# Patient Record
Sex: Male | Born: 1940 | Race: White | Hispanic: No | Marital: Married | Smoking: Former smoker
Health system: Southern US, Community
[De-identification: ages and names within clinical notes are randomized; demographics above are authoritative.]

## PROBLEM LIST (undated history)

## (undated) DIAGNOSIS — I2510 Atherosclerotic heart disease of native coronary artery without angina pectoris: Secondary | ICD-10-CM

## (undated) DIAGNOSIS — I10 Essential (primary) hypertension: Secondary | ICD-10-CM

## (undated) DIAGNOSIS — I779 Disorder of arteries and arterioles, unspecified: Secondary | ICD-10-CM

## (undated) DIAGNOSIS — C61 Malignant neoplasm of prostate: Secondary | ICD-10-CM

## (undated) DIAGNOSIS — K219 Gastro-esophageal reflux disease without esophagitis: Secondary | ICD-10-CM

## (undated) DIAGNOSIS — E119 Type 2 diabetes mellitus without complications: Secondary | ICD-10-CM

## (undated) DIAGNOSIS — J449 Chronic obstructive pulmonary disease, unspecified: Secondary | ICD-10-CM

## (undated) DIAGNOSIS — J189 Pneumonia, unspecified organism: Secondary | ICD-10-CM

## (undated) DIAGNOSIS — E785 Hyperlipidemia, unspecified: Secondary | ICD-10-CM

## (undated) DIAGNOSIS — I350 Nonrheumatic aortic (valve) stenosis: Secondary | ICD-10-CM

## (undated) DIAGNOSIS — D638 Anemia in other chronic diseases classified elsewhere: Secondary | ICD-10-CM

## (undated) DIAGNOSIS — M1990 Unspecified osteoarthritis, unspecified site: Secondary | ICD-10-CM

## (undated) DIAGNOSIS — I714 Abdominal aortic aneurysm, without rupture, unspecified: Secondary | ICD-10-CM

## (undated) DIAGNOSIS — T4145XA Adverse effect of unspecified anesthetic, initial encounter: Secondary | ICD-10-CM

## (undated) DIAGNOSIS — T8859XA Other complications of anesthesia, initial encounter: Secondary | ICD-10-CM

## (undated) DIAGNOSIS — R0602 Shortness of breath: Secondary | ICD-10-CM

## (undated) DIAGNOSIS — I5022 Chronic systolic (congestive) heart failure: Secondary | ICD-10-CM

## (undated) DIAGNOSIS — I255 Ischemic cardiomyopathy: Secondary | ICD-10-CM

## (undated) DIAGNOSIS — R7303 Prediabetes: Secondary | ICD-10-CM

## (undated) DIAGNOSIS — N189 Chronic kidney disease, unspecified: Secondary | ICD-10-CM

## (undated) DIAGNOSIS — I739 Peripheral vascular disease, unspecified: Secondary | ICD-10-CM

## (undated) DIAGNOSIS — Z9289 Personal history of other medical treatment: Secondary | ICD-10-CM

## (undated) DIAGNOSIS — I509 Heart failure, unspecified: Secondary | ICD-10-CM

## (undated) DIAGNOSIS — F419 Anxiety disorder, unspecified: Secondary | ICD-10-CM

## (undated) DIAGNOSIS — G4733 Obstructive sleep apnea (adult) (pediatric): Secondary | ICD-10-CM

## (undated) HISTORY — DX: Gastro-esophageal reflux disease without esophagitis: K21.9

## (undated) HISTORY — PX: CARDIAC VALVE REPLACEMENT: SHX585

---

## 1969-03-03 HISTORY — PX: NASAL FRACTURE SURGERY: SHX718

## 2006-04-05 ENCOUNTER — Encounter: Admission: RE | Admit: 2006-04-05 | Discharge: 2006-04-05 | Payer: Self-pay | Admitting: Internal Medicine

## 2006-07-03 HISTORY — PX: ANAL FISSURE REPAIR: SHX2312

## 2008-05-06 ENCOUNTER — Emergency Department (HOSPITAL_COMMUNITY): Admission: EM | Admit: 2008-05-06 | Discharge: 2008-05-06 | Payer: Self-pay | Admitting: Emergency Medicine

## 2011-04-04 LAB — BASIC METABOLIC PANEL WITH GFR
Calcium: 9.3
Creatinine, Ser: 0.86
GFR calc Af Amer: >60
GFR calc non Af Amer: >60
Glucose, Bld: 100 — ABNORMAL HIGH
Sodium: 141

## 2011-04-04 LAB — CBC
HCT: 45.5
Hemoglobin: 15.1
MCHC: 33.1
MCV: 91.6
Platelets: 157
RBC: 4.96
RDW: 13.7
WBC: 6.7

## 2011-04-04 LAB — DIFFERENTIAL
Basophils Absolute: 0
Basophils Relative: 1
Eosinophils Absolute: 0.2
Eosinophils Relative: 3
Lymphocytes Relative: 20
Lymphs Abs: 1.3
Monocytes Absolute: 0.5
Monocytes Relative: 7
Neutro Abs: 4.7
Neutrophils Relative %: 70

## 2011-04-04 LAB — BASIC METABOLIC PANEL
BUN: 14
CO2: 29
Chloride: 105
Potassium: 3.3 — ABNORMAL LOW

## 2011-04-04 LAB — POCT CARDIAC MARKERS
CKMB, poc: 1.4
CKMB, poc: <1 — ABNORMAL LOW
Myoglobin, poc: 54.8
Troponin i, poc: <0.05
Troponin i, poc: <0.05

## 2012-07-03 DIAGNOSIS — J189 Pneumonia, unspecified organism: Secondary | ICD-10-CM

## 2012-07-03 HISTORY — PX: TRACHEOSTOMY: SUR1362

## 2012-07-03 HISTORY — DX: Pneumonia, unspecified organism: J18.9

## 2012-07-20 ENCOUNTER — Inpatient Hospital Stay (HOSPITAL_COMMUNITY)
Admission: AD | Admit: 2012-07-20 | Discharge: 2012-08-12 | DRG: 878 | Disposition: A | Discharge status: Skilled Nursing Facility | Payer: BC Managed Care – PPO | Source: Other Acute Inpatient Hospital | Attending: Internal Medicine | Admitting: Internal Medicine

## 2012-07-20 ENCOUNTER — Inpatient Hospital Stay (HOSPITAL_COMMUNITY): Payer: BC Managed Care – PPO

## 2012-07-20 DIAGNOSIS — Y849 Medical procedure, unspecified as the cause of abnormal reaction of the patient, or of later complication, without mention of misadventure at the time of the procedure: Secondary | ICD-10-CM | POA: Diagnosis not present

## 2012-07-20 DIAGNOSIS — R7309 Other abnormal glucose: Secondary | ICD-10-CM | POA: Diagnosis present

## 2012-07-20 DIAGNOSIS — J449 Chronic obstructive pulmonary disease, unspecified: Secondary | ICD-10-CM | POA: Diagnosis present

## 2012-07-20 DIAGNOSIS — J4489 Other specified chronic obstructive pulmonary disease: Secondary | ICD-10-CM | POA: Diagnosis present

## 2012-07-20 DIAGNOSIS — R6520 Severe sepsis without septic shock: Secondary | ICD-10-CM | POA: Diagnosis present

## 2012-07-20 DIAGNOSIS — E876 Hypokalemia: Secondary | ICD-10-CM | POA: Diagnosis not present

## 2012-07-20 DIAGNOSIS — I214 Non-ST elevation (NSTEMI) myocardial infarction: Secondary | ICD-10-CM | POA: Diagnosis not present

## 2012-07-20 DIAGNOSIS — E785 Hyperlipidemia, unspecified: Secondary | ICD-10-CM | POA: Diagnosis present

## 2012-07-20 DIAGNOSIS — L270 Generalized skin eruption due to drugs and medicaments taken internally: Secondary | ICD-10-CM | POA: Diagnosis not present

## 2012-07-20 DIAGNOSIS — E872 Acidosis, unspecified: Secondary | ICD-10-CM | POA: Diagnosis present

## 2012-07-20 DIAGNOSIS — G4733 Obstructive sleep apnea (adult) (pediatric): Secondary | ICD-10-CM | POA: Diagnosis present

## 2012-07-20 DIAGNOSIS — I509 Heart failure, unspecified: Secondary | ICD-10-CM | POA: Diagnosis present

## 2012-07-20 DIAGNOSIS — G253 Myoclonus: Secondary | ICD-10-CM | POA: Diagnosis not present

## 2012-07-20 DIAGNOSIS — T80211A Bloodstream infection due to central venous catheter, initial encounter: Secondary | ICD-10-CM | POA: Diagnosis not present

## 2012-07-20 DIAGNOSIS — D649 Anemia, unspecified: Secondary | ICD-10-CM | POA: Diagnosis present

## 2012-07-20 DIAGNOSIS — J9601 Acute respiratory failure with hypoxia: Secondary | ICD-10-CM | POA: Diagnosis present

## 2012-07-20 DIAGNOSIS — J9600 Acute respiratory failure, unspecified whether with hypoxia or hypercapnia: Secondary | ICD-10-CM | POA: Diagnosis present

## 2012-07-20 DIAGNOSIS — J13 Pneumonia due to Streptococcus pneumoniae: Secondary | ICD-10-CM | POA: Diagnosis present

## 2012-07-20 DIAGNOSIS — I714 Abdominal aortic aneurysm, without rupture, unspecified: Secondary | ICD-10-CM | POA: Diagnosis present

## 2012-07-20 DIAGNOSIS — F17200 Nicotine dependence, unspecified, uncomplicated: Secondary | ICD-10-CM | POA: Diagnosis present

## 2012-07-20 DIAGNOSIS — I723 Aneurysm of iliac artery: Secondary | ICD-10-CM | POA: Diagnosis present

## 2012-07-20 DIAGNOSIS — E871 Hypo-osmolality and hyponatremia: Secondary | ICD-10-CM | POA: Diagnosis not present

## 2012-07-20 DIAGNOSIS — J869 Pyothorax without fistula: Secondary | ICD-10-CM | POA: Diagnosis not present

## 2012-07-20 DIAGNOSIS — D696 Thrombocytopenia, unspecified: Secondary | ICD-10-CM | POA: Diagnosis present

## 2012-07-20 DIAGNOSIS — Y9210 Unspecified residential institution as the place of occurrence of the external cause: Secondary | ICD-10-CM | POA: Diagnosis not present

## 2012-07-20 DIAGNOSIS — J80 Acute respiratory distress syndrome: Secondary | ICD-10-CM | POA: Diagnosis present

## 2012-07-20 DIAGNOSIS — R569 Unspecified convulsions: Secondary | ICD-10-CM

## 2012-07-20 DIAGNOSIS — I10 Essential (primary) hypertension: Secondary | ICD-10-CM | POA: Diagnosis present

## 2012-07-20 DIAGNOSIS — G9340 Encephalopathy, unspecified: Secondary | ICD-10-CM | POA: Diagnosis present

## 2012-07-20 DIAGNOSIS — T360X5A Adverse effect of penicillins, initial encounter: Secondary | ICD-10-CM | POA: Diagnosis not present

## 2012-07-20 DIAGNOSIS — S058X9A Other injuries of unspecified eye and orbit, initial encounter: Secondary | ICD-10-CM | POA: Diagnosis not present

## 2012-07-20 DIAGNOSIS — N179 Acute kidney failure, unspecified: Secondary | ICD-10-CM | POA: Diagnosis present

## 2012-07-20 DIAGNOSIS — K560 Paralytic ileus: Secondary | ICD-10-CM | POA: Diagnosis not present

## 2012-07-20 DIAGNOSIS — Y998 Other external cause status: Secondary | ICD-10-CM

## 2012-07-20 DIAGNOSIS — A409 Streptococcal sepsis, unspecified: Principal | ICD-10-CM | POA: Diagnosis present

## 2012-07-20 DIAGNOSIS — R259 Unspecified abnormal involuntary movements: Secondary | ICD-10-CM

## 2012-07-20 DIAGNOSIS — E2749 Other adrenocortical insufficiency: Secondary | ICD-10-CM | POA: Diagnosis not present

## 2012-07-20 DIAGNOSIS — J9589 Other postprocedural complications and disorders of respiratory system, not elsewhere classified: Secondary | ICD-10-CM

## 2012-07-20 DIAGNOSIS — J111 Influenza due to unidentified influenza virus with other respiratory manifestations: Secondary | ICD-10-CM | POA: Diagnosis present

## 2012-07-20 DIAGNOSIS — A419 Sepsis, unspecified organism: Secondary | ICD-10-CM | POA: Diagnosis not present

## 2012-07-20 DIAGNOSIS — X58XXXA Exposure to other specified factors, initial encounter: Secondary | ICD-10-CM

## 2012-07-20 HISTORY — DX: Obstructive sleep apnea (adult) (pediatric): G47.33

## 2012-07-20 HISTORY — DX: Prediabetes: R73.03

## 2012-07-20 HISTORY — DX: Essential (primary) hypertension: I10

## 2012-07-20 HISTORY — DX: Hyperlipidemia, unspecified: E78.5

## 2012-07-20 LAB — URINALYSIS, ROUTINE W REFLEX MICROSCOPIC
Nitrite: NEGATIVE
Protein, ur: 30 mg/dL — AB
Urobilinogen, UA: 1 mg/dL (ref 0.0–1.0)

## 2012-07-20 LAB — BLOOD GAS, ARTERIAL
Acid-base deficit: 5.6 mmol/L — ABNORMAL HIGH (ref 0.0–2.0)
Drawn by: 252031
FIO2: 100 %
MECHVT: 580 mL
MECHVT: 580 mL
O2 Saturation: 92 %
O2 Saturation: 93.2 %
PEEP: 15 cmH2O
Patient temperature: 98.6
Patient temperature: 98.6
RATE: 25 resp/min
TCO2: 22.2 mmol/L (ref 0–100)

## 2012-07-20 LAB — CBC WITH DIFFERENTIAL/PLATELET
Basophils Absolute: 0 10*3/uL (ref 0.0–0.1)
Eosinophils Relative: 0 % (ref 0–5)
Lymphocytes Relative: 4 % — ABNORMAL LOW (ref 12–46)
Lymphs Abs: 0.4 10*3/uL — ABNORMAL LOW (ref 0.7–4.0)
MCH: 29.3 pg (ref 26.0–34.0)
MCHC: 33.1 g/dL (ref 30.0–36.0)
Monocytes Absolute: 0.1 10*3/uL (ref 0.1–1.0)
Neutrophils Relative %: 33 % — ABNORMAL LOW (ref 43–77)
Platelets: 144 10*3/uL — ABNORMAL LOW (ref 150–400)
RBC: 3.92 MIL/uL — ABNORMAL LOW (ref 4.22–5.81)
WBC Morphology: INCREASED

## 2012-07-20 LAB — COMPREHENSIVE METABOLIC PANEL
ALT: 42 U/L (ref 0–53)
BUN: 92 mg/dL — ABNORMAL HIGH (ref 6–23)
CO2: 20 mEq/L (ref 19–32)
Calcium: 7.8 mg/dL — ABNORMAL LOW (ref 8.4–10.5)
Creatinine, Ser: 4.04 mg/dL — ABNORMAL HIGH (ref 0.50–1.35)
GFR calc Af Amer: 16 mL/min — ABNORMAL LOW (ref >90)
GFR calc non Af Amer: 14 mL/min — ABNORMAL LOW (ref >90)
Glucose, Bld: 212 mg/dL — ABNORMAL HIGH (ref 70–99)
Sodium: 129 mEq/L — ABNORMAL LOW (ref 135–145)
Total Protein: 6 g/dL (ref 6.0–8.3)

## 2012-07-20 LAB — GLUCOSE, CAPILLARY: Glucose-Capillary: 197 mg/dL — ABNORMAL HIGH (ref 70–99)

## 2012-07-20 LAB — LACTIC ACID, PLASMA: Lactic Acid, Venous: 1.6 mmol/L (ref 0.5–2.2)

## 2012-07-20 LAB — POCT I-STAT 3, ART BLOOD GAS (G3+)
Bicarbonate: 21.5 mEq/L (ref 20.0–24.0)
TCO2: 23 mmol/L (ref 0–100)
pH, Arterial: 7.196 — CL (ref 7.350–7.450)
pO2, Arterial: 62 mmHg — ABNORMAL LOW (ref 80.0–100.0)

## 2012-07-20 LAB — URINE MICROSCOPIC-ADD ON

## 2012-07-20 LAB — APTT: aPTT: 35 seconds (ref 24–37)

## 2012-07-20 MED ORDER — INSULIN ASPART 100 UNIT/ML ~~LOC~~ SOLN
2.0000 [IU] | SUBCUTANEOUS | Status: DC
  Administered 2012-07-20: 4 [IU] via SUBCUTANEOUS
  Administered 2012-07-21: 6 [IU] via SUBCUTANEOUS

## 2012-07-20 MED ORDER — VANCOMYCIN HCL 10 G IV SOLR
2000.0000 mg | Freq: Once | INTRAVENOUS | Status: AC
  Administered 2012-07-20: 2000 mg via INTRAVENOUS
  Filled 2012-07-20: qty 2000

## 2012-07-20 MED ORDER — SODIUM CHLORIDE 0.9 % IV SOLN
1.0000 mg/h | INTRAVENOUS | Status: DC
  Administered 2012-07-20: 2 mg/h via INTRAVENOUS
  Administered 2012-07-21: 3 mg/h via INTRAVENOUS
  Administered 2012-07-21 – 2012-07-23 (×4): 4 mg/h via INTRAVENOUS
  Administered 2012-07-24 – 2012-07-28 (×4): 2 mg/h via INTRAVENOUS
  Filled 2012-07-20 (×12): qty 10

## 2012-07-20 MED ORDER — SODIUM CHLORIDE 0.9 % IV SOLN
250.0000 mL | INTRAVENOUS | Status: DC | PRN
  Administered 2012-07-22: 250 mL via INTRAVENOUS
  Administered 2012-07-26: 1000 mL via INTRAVENOUS

## 2012-07-20 MED ORDER — FENTANYL CITRATE 0.05 MG/ML IJ SOLN: 25.0000 ug/h | INTRAMUSCULAR | Status: DC

## 2012-07-20 MED ORDER — NOREPINEPHRINE BITARTRATE 1 MG/ML IJ SOLN
2.0000 ug/min | INTRAVENOUS | Status: DC
  Administered 2012-07-21: 50 ug/min via INTRAVENOUS
  Administered 2012-07-21: 22 ug/min via INTRAVENOUS
  Filled 2012-07-20 (×5): qty 16

## 2012-07-20 MED ORDER — IOHEXOL 300 MG/ML  SOLN
20.0000 mL | INTRAMUSCULAR | Status: DC
Start: 2012-07-20 — End: 2012-07-20

## 2012-07-20 MED ORDER — FENTANYL BOLUS VIA INFUSION: 25.0000 ug | Freq: Four times a day (QID) | INTRAVENOUS | Status: DC | PRN

## 2012-07-20 MED ORDER — DEXTROSE 5 % IV SOLN
1.0000 g | INTRAVENOUS | Status: DC
  Administered 2012-07-20 – 2012-07-24 (×5): 1 g via INTRAVENOUS
  Filled 2012-07-20 (×6): qty 1

## 2012-07-20 MED ORDER — SODIUM CHLORIDE 0.9 % IV SOLN: 1.0000 mg/h | INTRAVENOUS | Status: DC

## 2012-07-20 MED ORDER — PANTOPRAZOLE SODIUM 40 MG IV SOLR
40.0000 mg | Freq: Every day | INTRAVENOUS | Status: DC
  Administered 2012-07-20 – 2012-07-25 (×6): 40 mg via INTRAVENOUS
  Filled 2012-07-20 (×7): qty 40

## 2012-07-20 MED ORDER — SODIUM CHLORIDE 0.9 % IV SOLN
25.0000 ug/h | INTRAVENOUS | Status: DC
  Administered 2012-07-20: 100 ug/h via INTRAVENOUS
  Administered 2012-07-21: 300 ug/h via INTRAVENOUS
  Administered 2012-07-22: 250 ug/h via INTRAVENOUS
  Administered 2012-07-22 (×2): 300 ug/h via INTRAVENOUS
  Administered 2012-07-23: 200 ug/h via INTRAVENOUS
  Administered 2012-07-23 – 2012-07-24 (×2): 100 ug/h via INTRAVENOUS
  Administered 2012-07-24 – 2012-07-26 (×3): 200 ug/h via INTRAVENOUS
  Administered 2012-07-29: 50 ug/h via INTRAVENOUS
  Administered 2012-07-31 – 2012-08-01 (×2): 150 ug/h via INTRAVENOUS
  Administered 2012-08-02 (×2): 300 ug/h via INTRAVENOUS
  Administered 2012-08-03: 200 ug/h via INTRAVENOUS
  Administered 2012-08-03: 300 ug/h via INTRAVENOUS
  Administered 2012-08-05 – 2012-08-07 (×2): 100 ug/h via INTRAVENOUS
  Filled 2012-07-20 (×25): qty 50

## 2012-07-20 MED ORDER — MIDAZOLAM BOLUS VIA INFUSION: 1.0000 mg | INTRAVENOUS | Status: DC | PRN

## 2012-07-20 MED ORDER — ASPIRIN 300 MG RE SUPP
300.0000 mg | RECTAL | Status: AC
  Filled 2012-07-20: qty 1

## 2012-07-20 MED ORDER — ASPIRIN 81 MG PO CHEW
324.0000 mg | CHEWABLE_TABLET | ORAL | Status: AC
  Administered 2012-07-20: 324 mg via ORAL
  Filled 2012-07-20: qty 3
  Filled 2012-07-20: qty 1

## 2012-07-20 MED ORDER — MIDAZOLAM BOLUS VIA INFUSION
1.0000 mg | INTRAVENOUS | Status: DC | PRN
  Filled 2012-07-20: qty 2

## 2012-07-20 MED ORDER — BIOTENE DRY MOUTH MT LIQD
15.0000 mL | Freq: Four times a day (QID) | OROMUCOSAL | Status: DC
  Administered 2012-07-21 – 2012-08-12 (×92): 15 mL via OROMUCOSAL

## 2012-07-20 MED ORDER — OSELTAMIVIR PHOSPHATE 75 MG PO CAPS
75.0000 mg | ORAL_CAPSULE | Freq: Two times a day (BID) | ORAL | Status: DC
  Administered 2012-07-21: 75 mg via ORAL
  Filled 2012-07-20 (×4): qty 1

## 2012-07-20 MED ORDER — CHLORHEXIDINE GLUCONATE 0.12 % MT SOLN
15.0000 mL | Freq: Two times a day (BID) | OROMUCOSAL | Status: DC
  Administered 2012-07-20 – 2012-08-12 (×47): 15 mL via OROMUCOSAL
  Filled 2012-07-20 (×32): qty 15
  Filled 2012-07-20: qty 30
  Filled 2012-07-20 (×11): qty 15

## 2012-07-20 MED ORDER — SODIUM BICARBONATE 8.4 % IV SOLN
INTRAVENOUS | Status: DC
  Administered 2012-07-20 – 2012-07-21 (×3): via INTRAVENOUS
  Filled 2012-07-20 (×11): qty 150

## 2012-07-20 MED ORDER — VASOPRESSIN 20 UNIT/ML IJ SOLN
0.0300 [IU]/min | INTRAVENOUS | Status: DC
  Administered 2012-07-20 – 2012-07-23 (×4): 0.03 [IU]/min via INTRAVENOUS
  Filled 2012-07-20 (×3): qty 2.5

## 2012-07-20 MED ORDER — LEVOFLOXACIN IN D5W 750 MG/150ML IV SOLN
750.0000 mg | INTRAVENOUS | Status: DC
  Administered 2012-07-20 – 2012-07-24 (×3): 750 mg via INTRAVENOUS
  Filled 2012-07-20 (×3): qty 150

## 2012-07-20 MED ORDER — NOREPINEPHRINE BITARTRATE 1 MG/ML IJ SOLN
2.0000 ug/min | INTRAMUSCULAR | Status: DC
  Filled 2012-07-20 (×2): qty 4

## 2012-07-20 MED ORDER — IOHEXOL 300 MG/ML  SOLN
25.0000 mL | INTRAMUSCULAR | Status: AC
  Administered 2012-07-20 – 2012-07-21 (×2): 25 mL via ORAL

## 2012-07-20 MED ORDER — IOHEXOL 300 MG/ML  SOLN: 25.0000 mL | INTRAMUSCULAR | Status: DC

## 2012-07-20 MED ORDER — FENTANYL BOLUS VIA INFUSION
25.0000 ug | Freq: Four times a day (QID) | INTRAVENOUS | Status: DC | PRN
  Administered 2012-07-31: 25 ug via INTRAVENOUS
  Administered 2012-07-31 (×2): 50 ug via INTRAVENOUS
  Administered 2012-08-01 (×3): 100 ug via INTRAVENOUS
  Filled 2012-07-20: qty 100

## 2012-07-20 MED ORDER — HEPARIN SODIUM (PORCINE) 5000 UNIT/ML IJ SOLN
5000.0000 [IU] | Freq: Three times a day (TID) | INTRAMUSCULAR | Status: DC
  Administered 2012-07-20 – 2012-08-02 (×37): 5000 [IU] via SUBCUTANEOUS
  Filled 2012-07-20 (×42): qty 1

## 2012-07-20 NOTE — Progress Notes (Signed)
Abdominal film reviewed   CT abdomen ordered.

## 2012-07-20 NOTE — Progress Notes (Signed)
Called Dr. Conception Chancy about chest and abdomen xray findings. [name] wants OG on low intermittent suction at this time.

## 2012-07-20 NOTE — Progress Notes (Signed)
CRITICAL VALUE ALERT  Critical value received: Troponin 0.46  Date of notification:  07/20/2012  Time of notification:  10:09 PM  Critical value read back:yes  Nurse who received alert:  Leonides Sake   MD notified (1st page):  albon  Time of first page:  10:09 PM  MD notified (2nd page):  Time of second page:  Responding MD:  Conception Chancy  Time MD responded:  10:09 PM

## 2012-07-20 NOTE — H&P (Signed)
PULMONARY  / CRITICAL CARE MEDICINE  Name: [name] MRN: [id number] DOB: [birth year]    LOS: 0  REFERRING MD :  [name] ER   CHIEF COMPLAINT:  Resp Failure and PNA  BRIEF PATIENT DESCRIPTION:  72 yo male with known hx of HTN, CHF presented to [facility] ER on 1/18 w/ 1 week hx of cough , congestion w/ progressively worsening dyspnea . Hypoxia in ER , decompensated requiring intubation . CXR w/ multilobar PNA . Hypotension unresponsive to fluids requiring pressors. CCM asked to accept at [facility] ICU 1/18.   LINES / TUBES: L. CVL 1/18 >> ETT 1/18 >>  CULTURES: 1/18 BC>> 1/18 Sputum >> 1/18 UC >> 1/18 Flu  1/18 lactate 2.3  1/18 PCT>  ANTIBIOTICS: 1/18 Vanc>> 1/18 Levaquin >> 1/18 Tamiflu >>  SIGNIFICANT EVENTS:  1/18 Transfer to [facility] from [place] for resp failure   LEVEL OF CARE:  ICU  PRIMARY SERVICE:  PCCM  CONSULTANTS:   CODE STATUS : Full  DIET:  NPO  DVT Px:  Hep SQ  GI Px:  PPI   HISTORY OF PRESENT ILLNESS:  72 yo male with known hx of HTN, CHF presented to [facility] ER on 1/18 w/ 1 week hx of cough , congestion w/ progressively worsening dyspnea . Hypoxia in ER , decompensated requiring intubation . CXR w/ multilobar PNA . Hypotension unresponsive to fluid challenged requiring pressors. CCM asked to accept at [facility] ICU 1/18. Transported via carelink . Renal failure w/ scr ~4 . Received 6L + in ER.     PAST MEDICAL HISTORY :  No past medical history on file. No past surgical history on file. Prior to Admission medications   Not on File   Allergies not on file  FAMILY HISTORY:  No family history on file. SOCIAL HISTORY:  does not have a smoking history on file. He does not have any smokeless tobacco history on file. His alcohol and drug histories not on file.  REVIEW OF SYSTEMS:   Unable to obtain , sedated /intubated on vent    INTERVAL HISTORY:   VITAL SIGNS:   HEMODYNAMICS:   VENTILATOR SETTINGS:   INTAKE / OUTPUT: Intake/Output    None     PHYSICAL EXAMINATION: General:  Sedated on vent  Neuro:  Sedated , pupils 80mm RTL HEENT:  ETT  Cardiovascular:  Tachy, s1s2 nml,  Lungs:  Diminished BS w/ bibasilar crackles  Abdomen: obese, distended, hypoactive Musculoskeletal:  Intact  Skin:  Intact    LABS: Cbc No results found for this basename: WBC:,HGB:3,HCT:3,PLT:3 in the last 168 hours  Chemistry  No results found for this basename: NA:3,K:3,CL:3,CO2:3,BUN:3,CREATININE:3,CALCIUM:3,MG:3,PHOS:3,GLUCOSE:3 in the last 168 hours  Liver fxn No results found for this basename: AST:3,ALT:3,ALKPHOS:3,BILITOT:3,PROT:3,ALBUMIN:3 in the last 168 hours coags No results found for this basename: APTT:3,INR:3 in the last 168 hours Sepsis markers No results found for this basename: LATICACIDVEN:3,PROCALCITON:3 in the last 168 hours Cardiac markers No results found for this basename: CKTOTAL:3,CKMB:3,TROPONINI:3 in the last 168 hours BNP No results found for this basename: PROBNP:3 in the last 168 hours ABG No results found for this basename: PHART:3,PCO2ART:3,PO2ART:3,HCO3:3,TCO2:3 in the last 168 hours  CBG trend No results found for this basename: GLUCAP:5 in the last 168 hours  IMAGING: CXR 1/18 -bilateral aspdz   ECG:  DIAGNOSES: Active Problems:  * No active hospital problems. *    ASSESSMENT / PLAN:  PULMONARY  ASSESSMENT: Acute Hypoxic Respiratory Failure in setting of multilobar PNA   PLAN:   ARDS protocol -  keep plat <30 Pan cx w/ influenza panel  Resp isolation  Empiric Tamiflu  Begin Vanc and Levaquin    CARDIOVASCULAR  ASSESSMENT:  Hypotension -pressor dependent , 6L+  Hx of CHF  Elevated Cardiac enzymes -no acute changes on EKG, ? Demand vs ischemia   PLAN:  Levophed for MAP >65  Check cvp q4  Place A-line  Tr cardiac enzymes    RENAL  ASSESSMENT:   Acute renal Failure  UA +   PLAN:   Fluid resusuciation  Follow bmet  Check urine cx  Bicarb gtt to enable lung protective  ventilation  GASTROINTESTINAL  ASSESSMENT:    PLAN:   PPI  Monitor hbg  HEMATOLOGIC  ASSESSMENT:   PLAN:  Monitor   INFECTIOUS  ASSESSMENT:  Multilobar PNA  ?Influenza syndrome   PLAN:   Pan Cx /influenza panel  Empiric abx w/ Leva/Vanc /cefepime Tamiflu empiric   ENDOCRINE  ASSESSMENT:   Hyperglycemia   PLAN:   SSI   NEUROLOGIC  ASSESSMENT:   PLAN:   Sedation protocol -versed/ fent gtt  CLINICAL SUMMARY: Dense LLL & RLL pneumonia with ARDS/ acute renal failure ? Flu with nstemi - guarded prognosis    PARRETT,TAMMY NP-C  Pulmonary and [place] Pager: [phone]  I have personally obtained a history, examined the patient, evaluated laboratory and imaging results, formulated the assessment and plan and placed orders. CRITICAL CARE: The patient is critically ill with multiple organ systems failure and requires high complexity decision making for assessment and support, frequent evaluation and titration of therapies, application of advanced monitoring technologies and extensive interpretation of multiple databases. Critical Care Time devoted to patient care services described in this note is 60 minutes.   07/20/2012, 6:12 PM

## 2012-07-20 NOTE — Procedures (Signed)
Arterial Catheter Insertion Procedure Note QUENTON SHINALL [id number] 06-18-1941  Procedure: Insertion of Arterial Catheter  Indications: Blood pressure monitoring and Frequent blood sampling  Procedure Details Consent: Unable to obtain consent because of emergent medical necessity. Time Out: Verified patient identification, verified procedure, site/side was marked, verified correct patient position, special equipment/implants available, medications/allergies/relevent history reviewed, required imaging and test results available.  Performed  Maximum sterile technique was used including antiseptics, gloves, gown, hand hygiene and mask. Skin prep: Chlorhexidine; local anesthetic administered 22 gauge catheter was inserted into left radial artery using the Seldinger technique.  Evaluation Blood flow good; BP tracing good. Complications: No apparent complications.   [name] 07/20/2012

## 2012-07-20 NOTE — Progress Notes (Signed)
ANTIBIOTIC CONSULT NOTE - INITIAL  Pharmacy Consult for vancomycin, cefepime, levaquin Indication: acute respiratory failure/Community acquired PNA  No Known Allergies  Patient Measurements: Weight: 235 lb 3.7 oz (106.7 kg)  Vital Signs:   Intake/Output from previous day:   Intake/Output from this shift:    Labs: No results found for this basename: WBC:3,HGB:3,PLT:3,LABCREA:3,CREATININE:3 in the last 72 hours CrCl is unknown because there is no height on file for the current visit. No results found for this basename: VANCOTROUGH:2,VANCOPEAK:2,VANCORANDOM:2,GENTTROUGH:2,GENTPEAK:2,GENTRANDOM:2,TOBRATROUGH:2,TOBRAPEAK:2,TOBRARND:2,AMIKACINPEAK:2,AMIKACINTROU:2,AMIKACIN:2, in the last 72 hours   Microbiology: No results found for this or any previous visit (from the past 720 hour(s)).  Medical History: No past medical history on file. Assessment: 72 year old male with acute respiratory failure and severe pneumonia transferred to [facility] from [place] after intubation. It is unclear what the patient received besides respiratory care at [facility], he may have received zosyn. Labs from [place] show normal wbc at 9, but he appears to be in acute renal failure with a scr of 4.6. Will start empiric abx for CAP.  Given renal failure will load with vancomycin and plan on checking a random level 1/20 prior to redosing.  Goal of Therapy:  Vancomycin trough level 15-20 mcg/ml  Plan:  Measure antibiotic drug levels at steady state Follow up culture results Vancomycin 2g x 1 - random level 1/20 prior to next dose Cefepime 1g q 24h Levaquin 750mg  IV q48h  Georgina Peer 07/20/2012,6:47 PM

## 2012-07-20 NOTE — Progress Notes (Signed)
Per ARDS protocol the vent settings were changed by respiratory therapy to RR 35 and 7cc/kg VT.   The ABG showed worsening respiratory acidosis; Vent flow loops were reviewed and there was a significant auto PEEP.   For now we will return to previous settings and obtain ABG in 1 h. Respiratory therapy aware of plan and new orders.

## 2012-07-20 NOTE — Progress Notes (Signed)
RT placed patient on 580cc VT per MD order and RR 25.

## 2012-07-21 ENCOUNTER — Inpatient Hospital Stay (HOSPITAL_COMMUNITY): Payer: BC Managed Care – PPO

## 2012-07-21 ENCOUNTER — Encounter (HOSPITAL_COMMUNITY): Payer: Self-pay | Admitting: Internal Medicine

## 2012-07-21 LAB — GLUCOSE, CAPILLARY
Glucose-Capillary: 161 mg/dL — ABNORMAL HIGH (ref 70–99)
Glucose-Capillary: 169 mg/dL — ABNORMAL HIGH (ref 70–99)
Glucose-Capillary: 177 mg/dL — ABNORMAL HIGH (ref 70–99)
Glucose-Capillary: 183 mg/dL — ABNORMAL HIGH (ref 70–99)
Glucose-Capillary: 199 mg/dL — ABNORMAL HIGH (ref 70–99)
Glucose-Capillary: 200 mg/dL — ABNORMAL HIGH (ref 70–99)
Glucose-Capillary: 244 mg/dL — ABNORMAL HIGH (ref 70–99)

## 2012-07-21 LAB — BLOOD GAS, ARTERIAL
Bicarbonate: 21.5 mEq/L (ref 20.0–24.0)
O2 Saturation: 93.4 %
PEEP: 15 cmH2O
pH, Arterial: 7.295 — ABNORMAL LOW (ref 7.350–7.450)
pO2, Arterial: 73.4 mmHg — ABNORMAL LOW (ref 80.0–100.0)

## 2012-07-21 LAB — CBC
Hemoglobin: 11.4 g/dL — ABNORMAL LOW (ref 13.0–17.0)
MCH: 30.1 pg (ref 26.0–34.0)
MCHC: 34.2 g/dL (ref 30.0–36.0)
Platelets: 143 10*3/uL — ABNORMAL LOW (ref 150–400)
RDW: 15.1 % (ref 11.5–15.5)

## 2012-07-21 LAB — BASIC METABOLIC PANEL
Calcium: 7.3 mg/dL — ABNORMAL LOW (ref 8.4–10.5)
GFR calc non Af Amer: 15 mL/min — ABNORMAL LOW (ref >90)
Glucose, Bld: 254 mg/dL — ABNORMAL HIGH (ref 70–99)
Sodium: 125 mEq/L — ABNORMAL LOW (ref 135–145)

## 2012-07-21 LAB — MAGNESIUM: Magnesium: 1.7 mg/dL (ref 1.5–2.5)

## 2012-07-21 LAB — INFLUENZA PANEL BY PCR (TYPE A & B)
Influenza A By PCR: POSITIVE — AB
Influenza B By PCR: NEGATIVE

## 2012-07-21 MED ORDER — ASPIRIN 81 MG PO CHEW
81.0000 mg | CHEWABLE_TABLET | Freq: Every day | ORAL | Status: DC
  Administered 2012-07-21 – 2012-08-12 (×22): 81 mg
  Filled 2012-07-21 (×33): qty 1

## 2012-07-21 MED ORDER — HYDROCORTISONE SOD SUCCINATE 100 MG IJ SOLR
50.0000 mg | Freq: Four times a day (QID) | INTRAMUSCULAR | Status: DC
  Administered 2012-07-21 – 2012-07-24 (×12): 50 mg via INTRAVENOUS
  Filled 2012-07-21 (×16): qty 1

## 2012-07-21 MED ORDER — PHENYLEPHRINE HCL 10 MG/ML IJ SOLN
30.0000 ug/min | INTRAVENOUS | Status: DC
  Filled 2012-07-21: qty 4

## 2012-07-21 MED ORDER — PHENYLEPHRINE HCL 10 MG/ML IJ SOLN
30.0000 ug/min | INTRAVENOUS | Status: DC
  Administered 2012-07-21: 30 ug/min via INTRAVENOUS
  Filled 2012-07-21 (×3): qty 1

## 2012-07-21 MED ORDER — INSULIN REGULAR HUMAN 100 UNIT/ML IJ SOLN
INTRAMUSCULAR | Status: DC
  Administered 2012-07-21: 1.9 [IU]/h via INTRAVENOUS
  Filled 2012-07-21 (×3): qty 1

## 2012-07-21 MED ORDER — OSELTAMIVIR PHOSPHATE 75 MG PO CAPS
150.0000 mg | ORAL_CAPSULE | Freq: Every day | ORAL | Status: DC
  Administered 2012-07-21: 150 mg via ORAL
  Filled 2012-07-21: qty 2

## 2012-07-21 MED ORDER — CISATRACURIUM BOLUS VIA INFUSION
0.0500 mg/kg | Freq: Once | INTRAVENOUS | Status: AC
  Administered 2012-07-21: 5.5 mg via INTRAVENOUS
  Filled 2012-07-21: qty 6

## 2012-07-21 MED ORDER — ARTIFICIAL TEARS OP OINT
1.0000 "application " | TOPICAL_OINTMENT | Freq: Three times a day (TID) | OPHTHALMIC | Status: DC
  Administered 2012-07-21 – 2012-07-28 (×21): 1 via OPHTHALMIC
  Filled 2012-07-21 (×2): qty 3.5

## 2012-07-21 MED ORDER — OSELTAMIVIR PHOSPHATE 6 MG/ML PO SUSR
150.0000 mg | Freq: Every day | ORAL | Status: AC
  Administered 2012-07-22 – 2012-07-30 (×9): 150 mg
  Filled 2012-07-21 (×9): qty 25

## 2012-07-21 MED ORDER — SODIUM CHLORIDE 0.9 % IV SOLN
3.0000 ug/kg/min | INTRAVENOUS | Status: DC
  Administered 2012-07-21: 3 ug/kg/min via INTRAVENOUS
  Administered 2012-07-21: 1.5 ug/kg/min via INTRAVENOUS
  Administered 2012-07-22: 2.5 ug/kg/min via INTRAVENOUS
  Administered 2012-07-23 (×2): 2 ug/kg/min via INTRAVENOUS
  Administered 2012-07-24 – 2012-07-25 (×2): 2.5 ug/kg/min via INTRAVENOUS
  Administered 2012-07-25: 1.5 ug/kg/min via INTRAVENOUS
  Filled 2012-07-21 (×7): qty 20

## 2012-07-21 MED ORDER — OXEPA PO LIQD
1000.0000 mL | ORAL | Status: DC
  Filled 2012-07-21 (×2): qty 1000

## 2012-07-21 NOTE — Progress Notes (Signed)
eLink Physician-Brief Progress Note Patient Name: [name] DOB: 11/17/1940 MRN: [id number]  Date of Service  07/21/2012   HPI/Events of Note   CT abd shows   - pna in lung cut   - 6.8cm AAA   eICU Interventions  No acut intervention Bad prognosis AAA; bedside MD to have family discussion on result after 7am after rounds   Intervention Category Intermediate Interventions: Diagnostic test evaluation  [name] 07/21/2012, 4:09 AM

## 2012-07-21 NOTE — Progress Notes (Signed)
PULMONARY  / CRITICAL CARE MEDICINE  Name: [name] MRN: [id number] DOB: 12/02/1940    LOS: 46  REFERRING MD :  [name] ER  CHIEF COMPLAINT:  Respiratory failure and pneumonia  BRIEF PATIENT DESCRIPTION: 45 yoM with HTN, CHF presented to [facility] ER on 1/18 w/ 1 week hx of cough , congestion w/ progressively worsening dyspnea . Hypoxia in ER , decompensated requiring intubation . CXR w/ multilobar PNA . Hypotension unresponsive to fluids requiring pressors. CCM asked to accept at [facility] ICU 1/18.    LINES / TUBES: L. CVL 1/18 >>  ETT 1/18 >>  CULTURES: [facility]: 1/18 Blood culture x 2 >> GPC in clusters in 1 of 2  [place]: 1/18 BC >>  1/18 Respiratory virus panel >>>  1/18 UC >>>  1/18 Flu PCR >>> 1/18 lactate 2.3  1/19 PCT >>>8   ANTIBIOTICS: 1/18 Vanc>>  1/18 Levaquin >>  1/18 Tamiflu >>  SIGNIFICANT EVENTS:  1/18 Transfer to [facility] from [place] for respiratory failure   LEVEL OF CARE:  ICU PRIMARY SERVICE:  PCCM CONSULTANTS:  None CODE STATUS: Full DIET:  NPO DVT Px:  Heparin SQ GI Px:  Protonix   HISTORY OF PRESENT ILLNESS:  72 yo male with known hx of HTN, CHF presented to [facility] ER on 1/18 w/ 1 week hx of cough , congestion w/ progressively worsening dyspnea . Hypoxia in ER , decompensated requiring intubation . CXR w/ multilobar PNA . Hypotension unresponsive to fluid challenged requiring pressors. CCM asked to accept at [facility] ICU 1/18. Transported via [facility]link . Renal failure w/ scr ~4 . Received 6L + in ER.    SUBJECTIVE / INTERVAL HISTORY:  Patient intubated and sedated, not able to provide history.  Interval History: - Hyperglycemic overnight --> started Insulin gtt - CT abd showing AAA - Remains on ARDS protocol, 100%/ +15  VITAL SIGNS: Temp:  [98.3 F (36.8 C)-100.1 F (37.8 C)] 100.1 F (37.8 C) (01/19 0744) Pulse Rate:  [98-122] 120  (01/19 0830) Resp:  [18-35] 25  (01/19 0830) BP: (81-130)/(54-80) 130/54 mmHg (01/19  0830) SpO2:  [87 %-94 %] 92 % (01/19 0830) Arterial Line BP: (85-129)/(47-74) 105/54 mmHg (01/19 0730) FiO2 (%):  [99.8 %-100 %] 100 % (01/19 0830) Weight:  [235 lb 3.7 oz (106.7 kg)-242 lb 8.1 oz (110 kg)] 242 lb 8.1 oz (110 kg) (01/19 0500) HEMODYNAMICS: CVP:  [10 mmHg] 10 mmHg VENTILATOR SETTINGS: Vent Mode:  [-] PRVC FiO2 (%):  [99.8 %-100 %] 100 % Set Rate:  [25 bmp-35 bmp] 25 bmp Vt Set:  [460 mL-580 mL] 580 mL PEEP:  [15 cmH20] 15 cmH20 Plateau Pressure:  [27 cmH20-30 cmH20] 27 cmH20 INTAKE / OUTPUT: Intake/Output      01/18 0701 - 01/19 0700 01/19 0701 - 01/20 0700   I.V. (mL/kg) 2090.3 (19)    IV Piggyback 550    Total Intake(mL/kg) 2640.3 (24)    Urine (mL/kg/hr) 775 (0.3)    Total Output 775    Net +1865.3           PHYSICAL EXAMINATION: General: Intubated and sedated.  Head: Normocephalic, atraumatic.  Lungs:  Normal respiratory effort. Diminished NS Anterior crackles.  Heart: RRR.   Abdomen:  BS hypoactive. Soft, Distended, non-tender.   Extremities: No pretibial edema.     IMAGING: 1/18 - CXR  - Bilateral pulmonary opacities are stable consistent with pneumonia. ET tube 4.2 cm above the carina.  1/19 - CT Abd - Abdominal aortic aneurysm measuring 6.4  cm AP diameter. 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 Scattered free fluid in the pelvis and pericolic gutters as well as right lower quadrant mesentery. No definite evidence of aneurysm rupture but the size of the aneurysm places the patient at increased risk. Bilateral lower lung consolidation and small pleural effusions.  1/19 - CXR - Minimal progression of right base air space disease. Otherwise, similar left greater than right pulmonary opacities. Infection and/or alveolar edema. Small left pleural effusion.   MEDICATIONS: Infusions    . cisatracurium (NIMBEX) infusion 3 mcg/kg/min (07/21/12 0900)  . fentaNYL infusion INTRAVENOUS 150 mcg/hr (07/21/12 0200)  . insulin (NOVOLIN-R)  infusion 3.2 Units/hr (07/21/12 0636)  . midazolam (VERSED) infusion 2 mg/hr (07/20/12 2031)  . norepinephrine (LEVOPHED) Adult infusion 22 mcg/min (07/21/12 0600)  .  sodium bicarbonate  infusion 1000 mL 125 mL/hr at 07/20/12 2100  . vasopressin (PITRESSIN) infusion - *FOR SHOCK* 0.03 Units/min (07/20/12 2042)    Scheduled    . antiseptic oral rinse  15 mL Mouth Rinse QID  . artificial tears  1 application Both Eyes [id number]  . aspirin  81 mg Per Tube Daily  . ceFEPime (MAXIPIME) IV  1 g Intravenous Q24H  . chlorhexidine  15 mL Mouth Rinse BID  . heparin  5,000 Units Subcutaneous Q8H  . levofloxacin (LEVAQUIN) IV  750 mg Intravenous Q48H  . oseltamivir  75 mg Oral BID  . pantoprazole (PROTONIX) IV  40 mg Intravenous QHS     DIAGNOSES: Active Problems:  Acute respiratory failure with hypoxia  ARDS (adult respiratory distress syndrome)  Acute renal failure   ASSESSMENT / PLAN:  PULMONARY  Lab 07/21/12 0445 07/20/12 2340 07/20/12 2122  PHART 7.295* 7.236* 7.196*  PCO2ART 46.2* 50.5* 55.4*  PO2ART 73.4* 68.2* 62.0*  HCO3 21.5 20.7 21.5  TCO2 22.9 22.2 23   A:  1) Multilobar PNA 2) ? Influenza syndrome 3) Severe ARDS - paO2/FiO2 = 73.   P:   - Continue Vancomycin, Levaquin, Cefipime, and Tamilfu - Pending respiratory viral panel, sputum culture per tracheal aspirate, flu PCR - ARDS protocol --Increase PEEP - Add Nimbex - Permissive hypercapnia ok - want pH > 7.2 - Maintain low tidal volume ventilation. - Continue bicarb gtt for acidosis.   CARDIOVASCULAR  Lab 07/21/12 0626 07/21/12 0026 07/20/12 2010  CKTOTAL -- -- --  CKMB -- -- --  TROPONINI 0.63* 0.50* 0.46*   No results found for this basename: PROBNP:3 in the last 168 hours A:  1) Sepsis - 22/ multilobar PNA, possible viral infection 2) Elevated troponins - question of demand ischemia vs NSTEMI in setting of sepsis vs renal failure. No new EKG changes. 3) AAA - 6.4 cm per CT Abd 1/19.  Will continue to  monitor unless developing hypotension or hemodynamic compromise. 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  P:  - Continue levophed/ vaso for MAP goal > 65, CVP goal 8-12. - Treat for sepsis - see ID section. - Monitor AAA for now, low threshold for vascular consult if worsening hemodynamically. - Add aspirin 81mg  daily.   RENAL  Lab 07/21/12 0449 07/20/12 2010  NA 125* 129*  K 4.0 4.1  CL 90* 93*  CO2 20 20  BUN 94* 92*  CREATININE 3.69* 4.04*  GLUCOSE 254* 212*   A: Acute renal failure  - likely 2/2 volume depletion in setting of sepsis, possible ATN in setting of hypotension. Improving.  P:  - Continue to  monitor. - Renally dose medications.   GASTROINTESTINAL  Lab 07/20/12 2010  AST 36  ALT 42  ALKPHOS 64  BILITOT 0.8  PROT 6.0  ALBUMIN 1.9*   A: No acute issues. P:  Tube feeds.   HEMATOLOGIC  Lab 07/21/12 0449 07/20/12 2010  WBC 14.7* 10.3  HGB 11.4* 11.5*  HCT 33.3* 34.7*  PLT 143* 144*  APTT -- 35  INR -- 1.34   A:  1) Acute normocytic anemia - likely related to critical illness. No obvious source of bleed. 2) Thrombocytopenia - unknown baseline, low normal in 05/2008. No obvious source of bleed. Likely related to critical illness. P:  - Continue to monitor CBC.   INFECTIOUS  Lab 07/21/12 0449 07/20/12 2010 07/20/12 1900  WBC 14.7* 10.3 --  NEUTROABS -- 9.8* --  LATICACIDVEN -- -- 1.6  PROCALCITON -- -- --   A:  1) Sepsis secondary to multilobar PNA, possible influenza, 2) Positive Blood cultures - 1/2 BCx from [place] growing GPC in clusters   P:  - Check cortisol - Add stress dose steroids - Continue Vanc, Levaquin, Cefipime - Continue empiric Tamilfu - escalate to 150mg  (once daily for renal function) x 10 days. - Follow-up cultures.   ENDOCRINE / RHEUMATOLOGIC  Lab 07/21/12 0732 07/21/12 [id number] 07/21/12 0357 07/21/12 0019 07/20/12 1948  GLUCAP 207* 218* 248* 244* 197*   A: 1) Hyperglycemia - unknown if  DM at baseline. Remains hyperglycemic overnight, now on insulin gtt. P:  - Continue insulin gtt. - ICU hyperglycemia protocol.   NEUROLOGIC / PSYCHIATRIC RAAS Goal: -3 CAM-ICU: Cannot assess     A: 1) Acute encephalopathy - in setting of sepsis.  P:  - Continue with RAAS goal of -3. Nimbex - goal vent synchrony    CLINICAL SUMMARY: [name] with HTN, CHF admitted to [facility] MICU on transfer from [facility] on 07/20/2012 secondary to sepsis in setting of multilobar PNA. Intubated in ED secondary to hypoxic respiratory failure. Has ARDS, continuing ARD protocol, adding Nimbex 1/19, on pressors. Empirically treating with broad spectrum ABx while awaiting culture results. Prognosis guarded   Signed: Chilton Greathouse, D.OKimberlee Nearing, Internal Medicine Resident Pager: [phone] (7AM-5PM)    ADDENDUM TO ABOVE: 12:31 PM  Patient has persistent hypotension requiring max levophed, therefore, will start Neosynephrine.  Family discussion: I spoke with the patient's wife and sister at bedside to update them of the current critical status of the patient. The patient confirms full code status, wanting all cardiac and respiratory therapies in the setting of a code.   Signed: Chilton Greathouse, Jacelyn Pi, Internal Medicine Resident Pager: [phone] (7AM-5PM)  Care during the described time interval was provided by me and/or other providers on the critical care team.  I have reviewed this patient's available data, including medical history, events of note, physical examination and test results as part of my evaluation  CC time x  35m  Lamar Naef V.  07/21/2012, 12:33 PM

## 2012-07-21 NOTE — Progress Notes (Signed)
ANTIBIOTIC CONSULT NOTE - Follow Up  Pharmacy Consult for vancomycin, cefepime, levaquin Indication: acute respiratory failure/Community acquired PNA  No Known Allergies  Patient Measurements: Height: 5\' 7"  (170.2 cm) Weight: 242 lb 8.1 oz (110 kg) IBW/kg (Calculated) : 66.1   Vital Signs: Temp: 99.6 F (37.6 C) (01/19 1221) Temp src: Oral (01/19 1221) BP: 101/60 mmHg (01/19 1200) Pulse Rate: 127  (01/19 1200) Intake/Output from previous day: 01/18 0701 - 01/19 0700 In: 2640.3 [I.V.:2090.3; IV Piggyback:550] Out: [id number] [Urine:775] Intake/Output from this shift: Total I/O In: 858 [I.V.:858] Out: -   Labs:  Basename 07/21/12 0449 07/20/12 2010  WBC 14.7* 10.3  HGB 11.4* 11.5*  PLT 143* 144*  LABCREA -- --  CREATININE 3.69* 4.04*   Estimated Creatinine Clearance: 22.1 ml/min (by C-G formula based on Cr of 3.69).  Basename 07/21/12 1159  VANCO RANDOM 18.5     Microbiology: No results found for this or any previous visit (from the past 720 hour(s)).  Medical History: Past Medical History  Diagnosis Date  . Hypertension   . Hyperlipidemia   . OSA (obstructive sleep apnea)     on cpap  . Prediabetes    Assessment: 72 year old male with acute respiratory failure and severe pneumonia transferred to [facility] from [place] after intubation.  He was started on broad spectrum empiric antibiotic therapy with Cefepime, Levaquin and Vancomycin for CAP and Oseltamivir for flu.  Upon admission, he had acute renal failure with Creatinine of 4.0 and little UOP.  Today, his creatinine is down some to 3.69 but his UOP has not really picked up.  He has temp of 100.1 and his WBC is rising although he is also on steroids.  His CXR still shows infection and/or edema.  He received a dose of Vancomycin 2gm around 9PM yesterday.    A vancomycin random level drawn today around 12N is resulted at 18 mcg/ml which indicates that he is clearing the initial dose.  I suspect he will continue to clear  this dose and will be ready for a second dose in the morning.  Goal of Therapy:  Vancomycin trough level 15-20 mcg/ml  Plan:   Will plan to obtain a Vancomycin level in the morning and re-dose if it is < 15 mcg/ml.  Cefepime 1g q 24h  Levaquin 750mg  IV q48h  Rober Minion, PharmD., MS Clinical Pharmacist Pager:  [phone] Thank you for allowing pharmacy to be part of this patients care team. 07/21/2012,1:05 PM

## 2012-07-21 NOTE — Progress Notes (Signed)
Brief Nutrition Note  Consult received for enteral/tube feeding initiation and management.  Adult Enteral Nutrition Protocol initiated. Full assessment to follow.  ARDS protocol  Admitting Dx: pneumonia SEPSIS RENAL FAILURE  Body mass index is 37.98 kg/(m^2). Pt meets criteria for obesity, unspecified based on current BMI.  CMP     Component Value Date/Time   NA 125* 07/21/2012 0449   K 4.0 07/21/2012 0449   CL 90* 07/21/2012 0449   CO2 20 07/21/2012 0449   GLUCOSE 254* 07/21/2012 0449   BUN 94* 07/21/2012 0449   CREATININE 3.69* 07/21/2012 0449   CALCIUM 7.3* 07/21/2012 0449   PROT 6.0 07/20/2012 2010   ALBUMIN 1.9* 07/20/2012 2010   AST 36 07/20/2012 2010   ALT 42 07/20/2012 2010   ALKPHOS 64 07/20/2012 2010   BILITOT 0.8 07/20/2012 2010   GFRNONAA 15* 07/21/2012 0449   GFRAA 18* 07/21/2012 0449    Phosphorus  Date/Time Value Range Status  07/21/2012  4:49 AM 4.7* 2.3 - 4.6 mg/dL Final   Magnesium  Date/Time Value Range Status  07/21/2012  4:49 AM 1.7  1.5 - 2.5 mg/dL Final     Larey Seat, RD, LDN Pager #: [phone] After-Hours Pager #: [phone]

## 2012-07-21 NOTE — Progress Notes (Signed)
RN from [facility] ED called with a lab result. Blood culture (1/2) had gram + cocci. Reported result to resident Kalia-Reynolds.

## 2012-07-22 ENCOUNTER — Inpatient Hospital Stay (HOSPITAL_COMMUNITY): Payer: BC Managed Care – PPO

## 2012-07-22 LAB — GLUCOSE, CAPILLARY
Glucose-Capillary: 134 mg/dL — ABNORMAL HIGH (ref 70–99)
Glucose-Capillary: 122 mg/dL — ABNORMAL HIGH (ref 70–99)
Glucose-Capillary: 129 mg/dL — ABNORMAL HIGH (ref 70–99)
Glucose-Capillary: 184 mg/dL — ABNORMAL HIGH (ref 70–99)

## 2012-07-22 LAB — BLOOD GAS, ARTERIAL
Bicarbonate: 28.2 mEq/L — ABNORMAL HIGH (ref 20.0–24.0)
Drawn by: 36527
MECHVT: 580 mL
PEEP: 15 cmH2O
Patient temperature: 98.6
RATE: 25 resp/min
pH, Arterial: 7.415 (ref 7.350–7.450)

## 2012-07-22 LAB — COMPREHENSIVE METABOLIC PANEL
ALT: 30 U/L (ref 0–53)
AST: 34 U/L (ref 0–37)
BUN: 94 mg/dL — ABNORMAL HIGH (ref 6–23)
GFR calc Af Amer: 17 mL/min — ABNORMAL LOW (ref >90)
GFR calc non Af Amer: 15 mL/min — ABNORMAL LOW (ref >90)
Potassium: 3.4 mEq/L — ABNORMAL LOW (ref 3.5–5.1)
Sodium: 127 mEq/L — ABNORMAL LOW (ref 135–145)
Total Bilirubin: 0.6 mg/dL (ref 0.3–1.2)
Total Protein: 5.5 g/dL — ABNORMAL LOW (ref 6.0–8.3)

## 2012-07-22 LAB — POCT I-STAT 3, ART BLOOD GAS (G3+)
Acid-Base Excess: 4 mmol/L — ABNORMAL HIGH (ref 0.0–2.0)
TCO2: 26 mmol/L (ref 0–100)
pCO2 arterial: 56.3 mmHg — ABNORMAL HIGH (ref 35.0–45.0)
pCO2 arterial: 46.3 mmHg — ABNORMAL HIGH (ref 35.0–45.0)
pCO2 arterial: 60.3 mmHg (ref 35.0–45.0)
pH, Arterial: 7.242 — ABNORMAL LOW (ref 7.350–7.450)
pH, Arterial: 7.404 (ref 7.350–7.450)
pO2, Arterial: 79 mmHg — ABNORMAL LOW (ref 80.0–100.0)
pO2, Arterial: 71 mmHg — ABNORMAL LOW (ref 80.0–100.0)
pO2, Arterial: 76 mmHg — ABNORMAL LOW (ref 80.0–100.0)

## 2012-07-22 LAB — CBC
MCH: 29.5 pg (ref 26.0–34.0)
MCHC: 34.4 g/dL (ref 30.0–36.0)
Platelets: 133 10*3/uL — ABNORMAL LOW (ref 150–400)
RBC: 3.59 MIL/uL — ABNORMAL LOW (ref 4.22–5.81)

## 2012-07-22 LAB — CORTISOL: Cortisol, Plasma: 33.1 ug/dL

## 2012-07-22 LAB — URINE CULTURE

## 2012-07-22 MED ORDER — DEXTROSE 10 % IV SOLN: INTRAVENOUS | Status: DC | PRN

## 2012-07-22 MED ORDER — POTASSIUM CHLORIDE 10 MEQ/50ML IV SOLN
10.0000 meq | INTRAVENOUS | Status: AC
  Administered 2012-07-22 (×2): 10 meq via INTRAVENOUS
  Filled 2012-07-22 (×2): qty 50

## 2012-07-22 MED ORDER — POTASSIUM CHLORIDE 10 MEQ/100ML IV SOLN: 10.0000 meq | INTRAVENOUS | Status: DC

## 2012-07-22 MED ORDER — VANCOMYCIN HCL 10 G IV SOLR
1500.0000 mg | INTRAVENOUS | Status: DC
  Administered 2012-07-22: 1500 mg via INTRAVENOUS
  Filled 2012-07-22: qty 1500

## 2012-07-22 MED ORDER — INSULIN GLARGINE 100 UNIT/ML ~~LOC~~ SOLN
25.0000 [IU] | SUBCUTANEOUS | Status: DC
  Administered 2012-07-22 – 2012-07-23 (×2): 25 [IU] via SUBCUTANEOUS

## 2012-07-22 MED ORDER — INSULIN ASPART 100 UNIT/ML ~~LOC~~ SOLN
1.0000 [IU] | SUBCUTANEOUS | Status: DC
  Administered 2012-07-22: 2 [IU] via SUBCUTANEOUS
  Administered 2012-07-22: 3 [IU] via SUBCUTANEOUS
  Administered 2012-07-22 (×2): 2 [IU] via SUBCUTANEOUS
  Administered 2012-07-22: 1 [IU] via SUBCUTANEOUS
  Administered 2012-07-23 (×2): 3 [IU] via SUBCUTANEOUS

## 2012-07-22 MED ORDER — MAGNESIUM SULFATE 40 MG/ML IJ SOLN
2.0000 g | Freq: Once | INTRAMUSCULAR | Status: AC
  Administered 2012-07-22: 2 g via INTRAVENOUS
  Filled 2012-07-22: qty 50

## 2012-07-22 MED ORDER — OXEPA PO LIQD
1000.0000 mL | ORAL | Status: DC
  Administered 2012-07-22: 1000 mL
  Filled 2012-07-22 (×3): qty 1000

## 2012-07-22 NOTE — Progress Notes (Signed)
ANTIBIOTIC CONSULT NOTE - Follow Up  Pharmacy Consult for vancomycin Indication: acute respiratory failure/Community acquired PNA  No Known Allergies  Patient Measurements: Height: 5\' 7"  (170.2 cm) Weight: 242 lb 8.1 oz (110 kg) IBW/kg (Calculated) : 66.1   Vital Signs: Temp: 98.8 F (37.1 C) (01/20 0400) Temp src: Oral (01/20 0400) BP: 89/61 mmHg (01/20 0445) Pulse Rate: 83  (01/20 0445) Intake/Output from previous day: 01/19 0701 - 01/20 0700 In: 5003.2 [I.V.:5003.2] Out: 1045 [Urine:1045] Intake/Output from this shift: Total I/O In: 1927.5 [I.V.:1927.5] Out: 610 [Urine:610]  Labs:  [facility] 07/22/12 0417 07/21/12 0449 07/20/12 2010  WBC 17.4* 14.7* 10.3  HGB 10.6* 11.4* 11.5*  PLT 133* 143* 144*  LABCREA -- -- --  CREATININE 3.74* 3.69* 4.04*   Estimated Creatinine Clearance: 21.8 ml/min (by C-G formula based on Cr of 3.74).  Basename 07/21/12 1159  VANCO RANDOM 18.5     Microbiology: No results found for this or any previous visit (from the past 720 hour(s)).  Medical History: Past Medical History  Diagnosis Date  . Hypertension   . Hyperlipidemia   . OSA (obstructive sleep apnea)     on cpap  . Prediabetes    Assessment: 72 year old male with acute respiratory failure and severe pneumonia transferred to [facility] from [place] after intubation.  He was started on broad spectrum empiric antibiotic therapy with Cefepime, Levaquin and Vancomycin for CAP and Oseltamivir for flu.  Upon admission, he had acute renal failure with Creatinine of 4.0 and little UOP.      Random vanc level this am 11.9 mg/L  Goal of Therapy:  Vancomycin trough level 15-20 mcg/ml  Plan:   Start vancomycin 1500 mg IV q48 hours  Will monitor renal function closely and adjust dose as needed. Excell Seltzer, PharmD. Clinical Pharmacist Pager:  [phone] Thank you for allowing pharmacy to be part of this patients care team. 07/22/2012,5:21 AM

## 2012-07-22 NOTE — Progress Notes (Signed)
Changed RR to 35 per ARDS protocol. Repeat ABG in one hour.

## 2012-07-22 NOTE — Progress Notes (Signed)
INITIAL NUTRITION ASSESSMENT  DOCUMENTATION CODES Per approved criteria  -Obesity Unspecified   INTERVENTION:  Initiate TF via OG tube with Oxepa at 20 ml/h, hold at this rate for today.    Once TF tolerance is established, increase by 10 ml every 4 hours to goal rate of 30 ml/h with Prostat 60 ml TID to provide 1680 kcals (25 kcals/kg ideal weight), 135 gm protein, 565 ml free water daily.  NUTRITION DIAGNOSIS: Inadequate oral intake related to inability to eat as evidenced by NPO status.   Goal: Enteral nutrition to provide 60-70% of estimated calorie needs (22-25 kcals/kg ideal body weight) and >/= 90% of estimated protein needs, based on ASPEN guidelines for permissive underfeeding in critically ill obese individuals.  Monitor:  TF tolerance/adequacy, weight trend, labs, vent status.  Reason for Assessment: MD Consult for TF initiation and management.  72 y.o. male  Admitting Dx: Respiratory failure, PNA, H1N1 flu positive, ARDS  ASSESSMENT: Patient was transfered from [facility] ER with sepsis and ARDS secondary to influenza A/H1N1 multifocal pneumonia.  Patient was discussed in ICU rounds this morning. TF has not been initiated yet.  Plans to initiate TF today, but keep at 20 ml/h to assess tolerance; then advance tomorrow morning.  Per CT abdomen/pelvis on 1/18, enteric tube tip is in the proximal duodenal bulb region.   Patient is currently intubated on ventilator support.  MV: 14.6 Temp:Temp (24hrs), Avg:98.9 F (37.2 C), Min:98.4 F (36.9 C), Max:99.6 F (37.6 C)   Height: Ht Readings from Last 1 Encounters:  07/20/12 5\' 7"  (1.702 m)    Weight: Wt Readings from Last 1 Encounters:  07/22/12 242 lb 8.1 oz (110 kg)    Ideal Body Weight: 67.3 kg  % Ideal Body Weight: 163%  Wt Readings from Last 25 Encounters:  07/22/12 242 lb 8.1 oz (110 kg)   Usual Body Weight: unknown   BMI:  Body mass index is 37.98 kg/(m^2). class 2 obesity  Estimated Nutritional  Needs: Kcal: 2350 Protein: >134 gm Fluid: 2.3-2.5 L  Skin: no problems noted  Diet Order:  NPO; TF order:  Oxepa with goal rate of 40 ml/h; has not been started yet.  EDUCATION NEEDS: -Education not appropriate at this time   Intake/Output Summary (Last 24 hours) at 07/22/12 1147 Last data filed at 07/22/12 0900  Gross per 24 hour  Intake 5787.1 ml  Output   1120 ml  Net 4667.1 ml    Last BM: 1/16   Labs:   Lab 07/22/12 0417 07/21/12 0449 07/20/12 2010  NA 127* 125* 129*  K 3.4* 4.0 4.1  CL 87* 90* 93*  CO2 27 20 20   BUN 94* 94* 92*  CREATININE 3.74* 3.69* 4.04*  CALCIUM 6.9* 7.3* 7.8*  MG 1.8 1.7 --  PHOS 4.9* 4.7* --  GLUCOSE 133* 254* 212*    CBG (last 3)   Basename 07/22/12 0411 07/22/12 0325 07/22/12 0214  GLUCAP 129* 122* 130*    Scheduled Meds:   . antiseptic oral rinse  15 mL Mouth Rinse QID  . artificial tears  1 application Both Eyes [id number]  . aspirin  81 mg Per Tube Daily  . ceFEPime (MAXIPIME) IV  1 g Intravenous Q24H  . chlorhexidine  15 mL Mouth Rinse BID  . heparin  5,000 Units Subcutaneous Q8H  . hydrocortisone sod succinate (SOLU-CORTEF) injection  50 mg Intravenous Q6H  . insulin aspart  1-3 Units Subcutaneous Q4H  . insulin glargine  25 Units Subcutaneous Q24H  .  levofloxacin (LEVAQUIN) IV  750 mg Intravenous Q48H  . oseltamivir  150 mg Per Tube Daily  . pantoprazole (PROTONIX) IV  40 mg Intravenous QHS  . vancomycin  1,500 mg Intravenous Q48H    Continuous Infusions:   . cisatracurium (NIMBEX) infusion 3 mcg/kg/min (07/22/12 0900)  . dextrose    . feeding supplement (OXEPA)    . fentaNYL infusion INTRAVENOUS 300 mcg/hr (07/22/12 0810)  . midazolam (VERSED) infusion 4 mg/hr (07/22/12 0800)  . norepinephrine (LEVOPHED) Adult infusion Stopped (07/22/12 0830)  . phenylephrine (NEO-SYNEPHRINE) Adult infusion Stopped (07/21/12 1730)  . vasopressin (PITRESSIN) infusion - *FOR SHOCK* 0.03 Units/min (07/22/12 0024)    Past Medical  History  Diagnosis Date  . Hypertension   . Hyperlipidemia   . OSA (obstructive sleep apnea)     on cpap  . Prediabetes     No past surgical history on file.  Molli Barrows, RD, LDN, [place] Pager# [phone] After Hours Pager# [phone]

## 2012-07-22 NOTE — Progress Notes (Signed)
Utilization Review Completed.Donne Anon T1/20/2014

## 2012-07-22 NOTE — Progress Notes (Signed)
PULMONARY  / CRITICAL CARE MEDICINE  Name: [name] MRN: [id number] DOB: 03-Oct-1940    LOS: 2  REFERRING MD :  [name]  CHIEF COMPLAINT:  Respiratory failure and pneumonia  BRIEF PATIENT DESCRIPTION: 72 yoM with HTN, CHF admitted to [facility] on 1/18 on transfer from [facility] ER with sepsis and ARDS secondary to influenza A/H1N1 multifocal pneumonia.    LINES / TUBES: L. CVL 1/18 >>  ETT 1/18 >>  CULTURES: [facility]: 1/18 Blood culture x 2 >> GPC in pairs and chains in 1 of 2  [place]: 1/18 BC >>  1/18 Respiratory virus panel >>>  1/18 UC >>>  1/18 Flu PCR >>> Influenza A/ H1N1 1/18 lactate >> 2.3  1/19 PCT >> 8 >> 6.5   ANTIBIOTICS: 1/18 Vanc>>  1/18 Levaquin >>  1/18 Tamiflu >>  SIGNIFICANT EVENTS:  1/18 Transfer to [facility] from [place] for respiratory failure   LEVEL OF CARE:  ICU PRIMARY SERVICE:  PCCM CONSULTANTS:  None CODE STATUS: Full DIET:  NPO --> Will start Tube feeds DVT Px:  Heparin SQ GI Px:  Protonix   HISTORY OF PRESENT ILLNESS:  72 yo male with known hx of HTN, CHF presented to [facility] ER on 1/18 w/ 1 week hx of cough , congestion w/ progressively worsening dyspnea . Hypoxia in ER , decompensated requiring intubation . CXR w/ multilobar PNA . Hypotension unresponsive to fluid challenged requiring pressors. CCM asked to accept at [facility] ICU 1/18. Transported via [facility] . Renal failure w/ scr ~4 . Received 6L + in ER.    SUBJECTIVE / INTERVAL HISTORY:  Intubated, sedated, paralyzed.  Interval History: - Able to be weaned of neosynephrine, remains on vasopressin and Levophed. - Flu PCR returned (+) for H1N1 and influenza A. - Remains on ARDS protocol, 100%/ +15  VITAL SIGNS: Temp:  [98.4 F (36.9 C)-100.1 F (37.8 C)] 98.8 F (37.1 C) (01/20 0400) Pulse Rate:  [83-127] 83  (01/20 0445) Resp:  [21-26] 25  (01/20 0445) BP: (85-130)/(50-70) 89/61 mmHg (01/20 0445) SpO2:  [92 %-98 %] 98 % (01/20 0445) Arterial Line BP:  (85-119)/(42-66) 90/59 mmHg (01/20 0415) FiO2 (%):  [99.9 %-100 %] 100 % (01/20 0445) HEMODYNAMICS: CVP:  [7 mmHg-12 mmHg] 12 mmHg VENTILATOR SETTINGS: Vent Mode:  [-] PRVC FiO2 (%):  [99.9 %-100 %] 100 % Set Rate:  [15 bmp-25 bmp] 25 bmp Vt Set:  [580 mL] 580 mL PEEP:  [15 cmH20] 15 cmH20 Plateau Pressure:  [27 cmH20-34 cmH20] 32 cmH20 INTAKE / OUTPUT: Intake/Output      01/19 0701 - 01/20 0700   I.V. (mL/kg) 5003.2 (45.5)   Total Intake(mL/kg) 5003.2 (45.5)   Urine (mL/kg/hr) 1045 (0.4)   Total Output 1045   Net +3958.2         PHYSICAL EXAMINATION: General: Intubated and sedated.  Head: Normocephalic, atraumatic.  Lungs:  Normal respiratory effort.   Heart: RRR.   Abdomen:  BS hypoactive. Soft, Distended, non-tender.   Extremities: No pretibial edema.     IMAGING:  1/18 - CXR  - Bilateral pulmonary opacities are stable consistent with pneumonia. ET tube 4.2 cm above the carina.  1/19 - CT Abd - Abdominal aortic aneurysm measuring 6.4 cm AP diameter. 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 Scattered free fluid in the pelvis and pericolic gutters as well as right lower quadrant mesentery. No definite evidence of aneurysm rupture but the size of the aneurysm places the patient at increased risk. Bilateral lower  lung consolidation and small pleural effusions.  1/19 - CXR - Minimal progression of right base air space disease. Otherwise, similar left greater than right pulmonary opacities. Infection and/or alveolar edema. Small left pleural effusion.   MEDICATIONS: Infusions    . cisatracurium (NIMBEX) infusion 2 mcg/kg/min (07/22/12 0100)  . dextrose    . feeding supplement (OXEPA)    . fentaNYL infusion INTRAVENOUS 250 mcg/hr (07/22/12 0330)  . midazolam (VERSED) infusion 2 mg/hr (07/22/12 0400)  . norepinephrine (LEVOPHED) Adult infusion 5 mcg/min (07/22/12 0400)  . phenylephrine (NEO-SYNEPHRINE) Adult infusion Stopped (07/21/12 1730)  .   sodium bicarbonate  infusion 1000 mL 125 mL/hr at 07/21/12 1600  . vasopressin (PITRESSIN) infusion - *FOR SHOCK* 0.03 Units/min (07/22/12 0024)    Scheduled    . antiseptic oral rinse  15 mL Mouth Rinse QID  . artificial tears  1 application Both Eyes [id number]  . aspirin  81 mg Per Tube Daily  . ceFEPime (MAXIPIME) IV  1 g Intravenous Q24H  . chlorhexidine  15 mL Mouth Rinse BID  . heparin  5,000 Units Subcutaneous Q8H  . hydrocortisone sod succinate (SOLU-CORTEF) injection  50 mg Intravenous Q6H  . insulin aspart  1-3 Units Subcutaneous Q4H  . insulin glargine  25 Units Subcutaneous Q24H  . levofloxacin (LEVAQUIN) IV  750 mg Intravenous Q48H  . oseltamivir  150 mg Per Tube Daily  . pantoprazole (PROTONIX) IV  40 mg Intravenous QHS  . potassium chloride  10 mEq Intravenous Q1 Hr x 2  . vancomycin  1,500 mg Intravenous Q48H     DIAGNOSES: Active Problems:  Acute respiratory failure with hypoxia  ARDS (adult respiratory distress syndrome)  Acute renal failure   ASSESSMENT / PLAN:  PULMONARY  Lab 07/22/12 0500 07/21/12 0445 07/20/12 2340  PHART 7.415 7.295* 7.236*  PCO2ART 44.8 46.2* 50.5*  PO2ART 87.7 73.4* 68.2*  HCO3 28.2* 21.5 20.7  TCO2 29.6 22.9 22.2   A:  1) Multilobar PNA - pending respiratory viral panel, sputum culture per tracheal aspirate 2) Influenza A/ H1N1 infection 3) Severe ARDS - paO2/FiO2 = 87.  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 - Will attempt to decrease paralytics to off in AM. - Consider discontinue Nimbex after 48h. - Permissive hypercapnia ok - want pH > 7.2. - Maintain low tidal volume ventilation. - Reduce bicarb gtt for acidosis, which is significantly improved. - Add bronchodilators. - Will need smoking cessation education after extubation.  CARDIOVASCULAR  Lab 07/21/12 0626 07/21/12 0026 07/20/12 2010  CKTOTAL -- -- --  CKMB -- -- --    TROPONINI 0.63* 0.50* 0.46*   No results found for this basename: PROBNP:3 in the last 168 hours A:  1) Sepsis - 22/ multilobar PNA, influenza A/ H1N1 2) Elevated troponins - question of demand ischemia vs NSTEMI in setting of sepsis vs renal failure. No new EKG changes. 3) AAA - 6.4 cm per CT Abd 1/19.  Will continue to monitor unless developing hypotension or hemodynamic compromise. 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  P:  - Continue levophed/ vaso for MAP goal > 65, CVP goal 8-12. - Treat for sepsis - see ID section. - Monitor AAA for now, low threshold for vascular consult if worsening hemodynamically will monitor for now. - Continue aspirin 81mg  daily.  RENAL  Lab 07/22/12 0417 07/21/12 0449 07/20/12 2010  NA 127*  125* 129*  K 3.4* 4.0 4.1  CL 87* 90* 93*  CO2 27 20 20   BUN 94* 94* 92*  CREATININE 3.74* 3.69* 4.04*  GLUCOSE 133* 254* 212*   A:  1) Acute renal failure  - likely 2/2 volume depletion in setting of sepsis, possible ATN in setting of hypotension. 2) Hypokalemia - mild, likely 2/2 NPO status. Replete.   P:  - Continue to monitor. - Renally dose medications. - Initiate tube feeds.  GASTROINTESTINAL  Lab 07/22/12 0417 07/20/12 2010  AST 34 36  ALT 30 42  ALKPHOS 75 64  BILITOT 0.6 0.8  PROT 5.5* 6.0  ALBUMIN 1.5* 1.9*   A: No acute issues. P:  - Start tube feeds.  HEMATOLOGIC  Lab 07/22/12 0417 07/21/12 0449 07/20/12 2010  WBC 17.4* 14.7* 10.3  HGB 10.6* 11.4* 11.5*  HCT 30.8* 33.3* 34.7*  PLT 133* 143* 144*  APTT -- -- 35  INR -- -- 1.34   A:  1) Acute normocytic anemia - likely related to critical illness, unknown baseline. No obvious source of bleed. 2) Thrombocytopenia - unknown baseline, low normal in 05/2008. No obvious source of bleed. Likely related to critical illness. P:  - Continue to monitor CBC.  INFECTIOUS  Lab 07/22/12 0417 07/21/12 1029 07/21/12 0449 07/20/12 2010 07/20/12 1900  WBC 17.4* --  14.7* 10.3 --  NEUTROABS -- -- -- 9.8* --  LATICACIDVEN -- -- -- -- 1.6  PROCALCITON 6.50 8.24 -- -- --   A:  1) Sepsis secondary to multilobar PNA, possible influenza, 2) Positive Blood cultures - 1/2 BCx from [place] growing GPC in chains 3) Leukocytosis - likely worsened since 1/19 after stress dose steroids added.  P:  - Cortisol of 33, consider dc stress dose steroids. - Continue Vanc, Levaquin, Cefipime, Tamiflu - Follow-up cultures. - Spoke with [facility] 07/22/2012 5:30AM, no further speciation of cultures.    ENDOCRINE / RHEUMATOLOGIC  Lab 07/22/12 0411 07/22/12 0325 07/22/12 0214 07/22/12 0120 07/22/12 0004  GLUCAP 129* 122* 130* 113* 134*   A: 1) Prediabetes - blood sugars stable overnight. P:  - ICU hyperglycemia protocol.   NEUROLOGIC / PSYCHIATRIC RAAS Goal: -3 CAM-ICU: Cannot assess     A: 1) Acute encephalopathy - in setting of sepsis.  P:  - Continue with RAAS goal of -3. - Nimbex - goal vent synchrony  CLINICAL SUMMARY: 36 yoM with HTN, CHF admitted to [facility] MICU on transfer from [facility] on 07/20/2012 secondary to sepsis in setting  of influenza A/ H1N1 / multilobar PNA. Intubated in ED secondary to hypoxic respiratory failure. Has ARDS, continuing ARD protocol, adding Nimbex 1/19, on pressors. Empirically treating with broad spectrum ABx while awaiting culture results. Prognosis guarded  Signed: Chilton Greathouse, D.OKimberlee Nearing, Internal Medicine Resident Pager: [phone] (7AM-5PM)  CC time 35 min.  Patient seen and examined, agree with above note.  I dictated the care and orders written for this patient under my direction.  Jennet Maduro, M.D. [phone]

## 2012-07-22 NOTE — Progress Notes (Signed)
Patient changed to ARDS protocol by Dr. Chuck Hint. Vent settings changed to 6cc/kg per protocol and will repeat a ABG.

## 2012-07-22 NOTE — Progress Notes (Signed)
Inpatient Diabetes Program Recommendations  AACE/ADA: New Consensus Statement on Inpatient Glycemic Control (2013)  Target Ranges:  Prepandial:   less than 140 mg/dL      Peak postprandial:   less than 180 mg/dL (1-2 hours)      Critically ill patients:  140 - 180 mg/dL    Received referral for this patient.    Patient admitted with respiratory failure/PNA.  Currently on ARDS protocol.  Sedated and intubated.  Was placed on IV insulin drip yesterday (07/21/12) at 0530am.  Transitioned off IV insulin drip this morning at ~5am.  Patient received 25 units Lantus at ~5am.  Currently on IV steroids.    Patient remains on ICU Glycemic Control protocol.     Note: Will follow closely while inpatient. Wyn Quaker RN, MSN, CDE Diabetes Coordinator Inpatient Diabetes Program [phone]

## 2012-07-23 ENCOUNTER — Encounter (HOSPITAL_COMMUNITY): Payer: Self-pay

## 2012-07-23 ENCOUNTER — Inpatient Hospital Stay (HOSPITAL_COMMUNITY): Payer: BC Managed Care – PPO

## 2012-07-23 LAB — GLUCOSE, CAPILLARY
Glucose-Capillary: 191 mg/dL — ABNORMAL HIGH (ref 70–99)
Glucose-Capillary: 172 mg/dL — ABNORMAL HIGH (ref 70–99)
Glucose-Capillary: 157 mg/dL — ABNORMAL HIGH (ref 70–99)
Glucose-Capillary: 155 mg/dL — ABNORMAL HIGH (ref 70–99)
Glucose-Capillary: 141 mg/dL — ABNORMAL HIGH (ref 70–99)
Glucose-Capillary: 166 mg/dL — ABNORMAL HIGH (ref 70–99)

## 2012-07-23 LAB — BLOOD GAS, ARTERIAL
Bicarbonate: 28.7 mEq/L — ABNORMAL HIGH (ref 20.0–24.0)
Drawn by: 347621
MECHVT: 400 mL
PEEP: 16 cmH2O
Patient temperature: 98.4
pH, Arterial: 7.302 — ABNORMAL LOW (ref 7.350–7.450)

## 2012-07-23 LAB — CBC WITH DIFFERENTIAL/PLATELET
Eosinophils Relative: 0 % (ref 0–5)
Lymphs Abs: 0.5 10*3/uL — ABNORMAL LOW (ref 0.7–4.0)
MCH: 29.2 pg (ref 26.0–34.0)
MCV: 86.3 fL (ref 78.0–100.0)
Monocytes Absolute: 0.8 10*3/uL (ref 0.1–1.0)
Platelets: 157 10*3/uL (ref 150–400)
RBC: 3.8 MIL/uL — ABNORMAL LOW (ref 4.22–5.81)

## 2012-07-23 LAB — POCT I-STAT 3, ART BLOOD GAS (G3+)
Acid-Base Excess: 2 mmol/L (ref 0.0–2.0)
O2 Saturation: 91 %
pCO2 arterial: 56.5 mmHg — ABNORMAL HIGH (ref 35.0–45.0)
pO2, Arterial: 66 mmHg — ABNORMAL LOW (ref 80.0–100.0)

## 2012-07-23 LAB — COMPREHENSIVE METABOLIC PANEL
ALT: 33 U/L (ref 0–53)
AST: 37 U/L (ref 0–37)
Albumin: 1.6 g/dL — ABNORMAL LOW (ref 3.5–5.2)
Alkaline Phosphatase: 155 U/L — ABNORMAL HIGH (ref 39–117)
Chloride: 90 mEq/L — ABNORMAL LOW (ref 96–112)
Creatinine, Ser: 3.69 mg/dL — ABNORMAL HIGH (ref 0.50–1.35)
Potassium: 4.1 mEq/L (ref 3.5–5.1)
Sodium: 132 mEq/L — ABNORMAL LOW (ref 135–145)
Total Bilirubin: 0.5 mg/dL (ref 0.3–1.2)

## 2012-07-23 LAB — CULTURE, RESPIRATORY W GRAM STAIN

## 2012-07-23 MED ORDER — PRO-STAT SUGAR FREE PO LIQD
60.0000 mL | Freq: Three times a day (TID) | ORAL | Status: DC
  Administered 2012-07-23 – 2012-08-10 (×53): 60 mL
  Filled 2012-07-23 (×57): qty 60

## 2012-07-23 MED ORDER — SODIUM CHLORIDE 0.9 % IV SOLN
INTRAVENOUS | Status: DC
  Administered 2012-07-23: 1.3 [IU]/h via INTRAVENOUS
  Filled 2012-07-23: qty 1

## 2012-07-23 MED ORDER — INSULIN ASPART 100 UNIT/ML ~~LOC~~ SOLN
2.0000 [IU] | SUBCUTANEOUS | Status: DC
  Administered 2012-07-23 – 2012-07-30 (×41): 2 [IU] via SUBCUTANEOUS

## 2012-07-23 MED ORDER — FUROSEMIDE 10 MG/ML IJ SOLN
INTRAMUSCULAR | Status: AC
  Filled 2012-07-23: qty 4

## 2012-07-23 MED ORDER — INSULIN GLARGINE 100 UNIT/ML ~~LOC~~ SOLN
15.0000 [IU] | SUBCUTANEOUS | Status: DC
  Administered 2012-07-23 – 2012-07-29 (×7): 15 [IU] via SUBCUTANEOUS

## 2012-07-23 MED ORDER — OXEPA PO LIQD
1000.0000 mL | ORAL | Status: DC
Start: 2012-07-23 — End: 2012-08-13
  Administered 2012-07-23 – 2012-08-08 (×16): 1000 mL
  Filled 2012-07-23 (×24): qty 1000

## 2012-07-23 MED ORDER — ADULT MULTIVITAMIN LIQUID CH
5.0000 mL | Freq: Every day | ORAL | Status: DC
  Administered 2012-07-23 – 2012-08-10 (×19): 5 mL
  Filled 2012-07-23 (×20): qty 5

## 2012-07-23 MED ORDER — INSULIN ASPART 100 UNIT/ML ~~LOC~~ SOLN
2.0000 [IU] | SUBCUTANEOUS | Status: DC
  Administered 2012-07-23: 2 [IU] via SUBCUTANEOUS
  Administered 2012-07-23 – 2012-07-24 (×6): 4 [IU] via SUBCUTANEOUS
  Administered 2012-07-24: 6 [IU] via SUBCUTANEOUS
  Administered 2012-07-25 (×5): 4 [IU] via SUBCUTANEOUS
  Administered 2012-07-25 – 2012-07-26 (×3): 2 [IU] via SUBCUTANEOUS
  Administered 2012-07-26: 6 [IU] via SUBCUTANEOUS
  Administered 2012-07-26: 4 [IU] via SUBCUTANEOUS
  Administered 2012-07-27: 6 [IU] via SUBCUTANEOUS
  Administered 2012-07-27: 4 [IU] via SUBCUTANEOUS
  Administered 2012-07-27 (×2): 6 [IU] via SUBCUTANEOUS
  Administered 2012-07-27 – 2012-07-28 (×3): 4 [IU] via SUBCUTANEOUS
  Administered 2012-07-28: 6 [IU] via SUBCUTANEOUS
  Administered 2012-07-28: 4 [IU] via SUBCUTANEOUS
  Administered 2012-07-28: 6 [IU] via SUBCUTANEOUS
  Administered 2012-07-28 (×2): 4 [IU] via SUBCUTANEOUS
  Administered 2012-07-29: 6 [IU] via SUBCUTANEOUS
  Administered 2012-07-29: 4 [IU] via SUBCUTANEOUS
  Administered 2012-07-29: 6 [IU] via SUBCUTANEOUS
  Administered 2012-07-29 (×3): 4 [IU] via SUBCUTANEOUS
  Administered 2012-07-30 (×3): 6 [IU] via SUBCUTANEOUS

## 2012-07-23 MED ORDER — VANCOMYCIN HCL 10 G IV SOLR
1250.0000 mg | INTRAVENOUS | Status: DC
  Administered 2012-07-23 – 2012-07-24 (×2): 1250 mg via INTRAVENOUS
  Filled 2012-07-23 (×3): qty 1250

## 2012-07-23 MED ORDER — SODIUM CHLORIDE 0.9 % IV SOLN
INTRAVENOUS | Status: DC
  Filled 2012-07-23: qty 1

## 2012-07-23 NOTE — Progress Notes (Signed)
ANTIBIOTIC CONSULT NOTE - Follow Up  Pharmacy Consult for vancomycin Indication: acute respiratory failure/Community acquired PNA  No Known Allergies  Patient Measurements: Height: 5\' 7"  (170.2 cm) Weight: 249 lb 1.9 oz (113 kg) IBW/kg (Calculated) : 66.1   Vital Signs: Temp: 98.3 F (36.8 C) (01/21 1249) Temp src: Oral (01/21 1249) BP: 134/72 mmHg (01/21 1211) Pulse Rate: 119  (01/21 1211) Intake/Output from previous day: 01/20 0701 - 01/21 0700 In: 2347.4 [I.V.:1967.4; NG/GT:80; IV Piggyback:300] Out: 1305 [Urine:1305] Intake/Output from this shift: Total I/O In: 239.7 [I.V.:179.7; NG/GT:60] Out: 300 [Urine:300]  Labs:  [facility] 07/23/12 0440 07/22/12 0417 07/21/12 0449  WBC 25.1* 17.4* 14.7*  HGB 11.1* 10.6* 11.4*  PLT 157 133* 143*  LABCREA -- -- --  CREATININE 3.69* 3.74* 3.69*   Estimated Creatinine Clearance: 22.4 ml/min (by C-G formula based on Cr of 3.69).  Basename 07/21/12 1159  VANCO RANDOM 18.5     Microbiology: Recent Results (from the past 720 hour(s))  CULTURE, BLOOD (ROUTINE X 2)     Status: Normal (Preliminary result)   Collection Time   07/20/12  7:15 PM      Component Value Range Status Comment   Specimen Description BLOOD LEFT HAND   Final    Special Requests BOTTLES DRAWN AEROBIC AND ANAEROBIC 10CC   Final    Culture  Setup Time 07/21/2012 02:14   Final    Culture     Final    Value:        BLOOD CULTURE RECEIVED NO GROWTH TO DATE CULTURE WILL BE HELD FOR 5 DAYS BEFORE ISSUING A FINAL NEGATIVE REPORT   Report Status PENDING   Incomplete   CULTURE, BLOOD (ROUTINE X 2)     Status: Normal (Preliminary result)   Collection Time   07/20/12  7:30 PM      Component Value Range Status Comment   Specimen Description BLOOD LEFT HAND   Final    Special Requests BOTTLES DRAWN AEROBIC AND ANAEROBIC 10CC   Final    Culture  Setup Time 07/21/2012 02:14   Final    Culture     Final    Value:        BLOOD CULTURE RECEIVED NO GROWTH TO DATE CULTURE WILL  BE HELD FOR 5 DAYS BEFORE ISSUING A FINAL NEGATIVE REPORT   Report Status PENDING   Incomplete   URINE CULTURE     Status: Normal   Collection Time   07/20/12  9:00 PM      Component Value Range Status Comment   Specimen Description URINE, CATHETERIZED   Final    Special Requests NONE   Final    Culture  Setup Time 07/21/2012 02:56   Final    Colony Count NO GROWTH   Final    Culture NO GROWTH   Final    Report Status 07/22/2012 FINAL   Final   CULTURE, RESPIRATORY     Status: Normal   Collection Time   07/21/12  6:44 AM      Component Value Range Status Comment   Specimen Description TRACHEAL ASPIRATE   Final    Special Requests NONE   Final    Gram Stain     Final    Value: ABUNDANT WBC PRESENT, PREDOMINANTLY PMN     NO SQUAMOUS EPITHELIAL CELLS SEEN     RARE GRAM POSITIVE COCCI IN PAIRS   Culture NO GROWTH 2 DAYS   Final    Report Status 07/23/2012 FINAL  Final     Medical History: Past Medical History  Diagnosis Date  . Hypertension   . Hyperlipidemia   . OSA (obstructive sleep apnea)     on cpap  . Prediabetes    Assessment: 72 year old male with acute respiratory failure and severe pneumonia transferred to [facility] from [place] after intubation.  He was started on broad spectrum empiric antibiotic therapy with Cefepime, Levaquin and Vancomycin for CAP and Oseltamivir for flu.  Upon admission, he had acute renal failure with Creatinine of 4.0 and little UOP.   At this time,  His SCr remains elevated at 3.69.   Vancomcyin levels s/p vancomycin 2g IV x1 on 1/18 at 2100.  1/19@1159 : 18.5 1/20@0418 : 11.9 Then started on vancomycin 1500 mg IV q48h.  Based on these parameters, ke=0.028, t1/2=25h. Vancomycin 1250 mg IV q24h is expected to produce a peak of ~30 and a trough of ~16.   Goal of Therapy:  Vancomycin trough level 15-20 mcg/ml  Plan: - Change vancomycin to 1250 mg IV q24h -- first dose NOW - Consider trough at Css - Follow up SCr, UOP, cultures, clinical course  and adjust as clinically indicated  [name], PharmD, [place] Clinical Pharmacist Pager: [phone] Pharmacy: [phone] 07/23/2012 1:16 PM

## 2012-07-23 NOTE — Progress Notes (Signed)
ABG C02 of 58.7 called to Dr. Jerald Kief

## 2012-07-23 NOTE — Progress Notes (Signed)
PULMONARY  / CRITICAL CARE MEDICINE  Name: [name] MRN: [id number] DOB: 1940-09-15    LOS: 1  REFERRING MD :  [name]  CHIEF COMPLAINT:  Respiratory failure and pneumonia  BRIEF PATIENT DESCRIPTION: 72 yoM with HTN, CHF admitted to [facility] on 1/18 on transfer from [facility] ER with sepsis and ARDS secondary to influenza A/H1N1 multifocal pneumonia.    LINES / TUBES: L. CVL 1/18 >>  ETT 1/18 >>  CULTURES: [facility] [phone]): 1/18 Blood culture x 2 >> strep pneumoniae in 2 of 2 pansenstive (awaiting fax confirmation)  [place]: 1/18 BC >>  1/18 Respiratory virus panel >>>  1/18 UC >> negative 1/18 Flu PCR >> Influenza A/ H1N1 1/18 lactate >> 2.3  1/19 PCT >> 8 >> 6.5  ANTIBIOTICS: 1/18 Vanc >>  1/18 Levaquin >>  1/18 Tamiflu >> 1/18 Cefepime >>  SIGNIFICANT EVENTS:  1/18 Transfer to [facility] from [place] for respiratory failure   LEVEL OF CARE:  ICU PRIMARY SERVICE:  PCCM CONSULTANTS:  None CODE STATUS: Full DIET:  Tube feeds DVT Px:  Heparin SQ GI Px:  Protonix   HISTORY OF PRESENT ILLNESS:  72 yo male with known hx of HTN, CHF presented to [facility] ER on 1/18 w/ 1 week hx of cough , congestion w/ progressively worsening dyspnea . Hypoxia in ER , decompensated requiring intubation . CXR w/ multilobar PNA . Hypotension unresponsive to fluid challenged requiring pressors. CCM asked to accept at [facility] ICU 1/18. Transported via [facility] . Renal failure w/ scr ~4 . Received 6L + in ER.    SUBJECTIVE / INTERVAL HISTORY:  Intubated, sedated, paralyzed.  Interval History: - Able to be weaned of neosynephrine and levophed. Remains on vasopressin with MAPs above goal. - Remains on ARDS protocol, 100%/ +15  VITAL SIGNS: Temp:  [98.4 F (36.9 C)-98.9 F (37.2 C)] 98.4 F (36.9 C) (01/21 0345) Pulse Rate:  [77-109] 103  (01/21 0600) Resp:  [25-35] 35  (01/21 0600) BP: (64-129)/(53-80) 103/62 mmHg (01/21 0600) SpO2:  [93 %-97 %] 96 % (01/21  0600) Arterial Line BP: (88-131)/(51-73) 122/66 mmHg (01/21 0600) FiO2 (%):  [89.9 %-100 %] 90.2 % (01/21 0600) Weight:  [249 lb 1.9 oz (113 kg)] 249 lb 1.9 oz (113 kg) (01/21 0443) HEMODYNAMICS: CVP:  [10 mmHg-11 mmHg] 10 mmHg VENTILATOR SETTINGS: Vent Mode:  [-] PRVC FiO2 (%):  [89.9 %-100 %] 90.2 % Set Rate:  [28 bmp-35 bmp] 35 bmp Vt Set:  [400 mL-510 mL] 400 mL PEEP:  [16 cmH20-18 cmH20] 16 cmH20 Plateau Pressure:  [25 cmH20-30 cmH20] 30 cmH20 INTAKE / OUTPUT: Intake/Output      01/20 0701 - 01/21 0700 01/21 0701 - 01/22 0700   I.V. (mL/kg) 1902.3 (16.8)    NG/GT 60    IV Piggyback 300    Total Intake(mL/kg) 2262.3 (20)    Urine (mL/kg/hr) 1180 (0.4)    Total Output 1180    Net +1082.3           PHYSICAL EXAMINATION: General: Intubated and sedated.  Head: Normocephalic, atraumatic.  Lungs:  Normal respiratory effort.   Heart: RRR.   Abdomen:  BS hypoactive. Soft, Distended, non-tender.   Extremities: No pretibial edema.     IMAGING:  1/18 - CXR  - Bilateral pulmonary opacities are stable consistent with pneumonia. ET tube 4.2 cm above the carina.  1/19 - CT Abd - Abdominal aortic aneurysm measuring 6.4 cm AP diameter. 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 Scattered  free fluid in the pelvis and pericolic gutters as well as right lower quadrant mesentery. No definite evidence of aneurysm rupture but the size of the aneurysm places the patient at increased risk. Bilateral lower lung consolidation and small pleural effusions.  1/19 - CXR - Minimal progression of right base air space disease. Otherwise, similar left greater than right pulmonary opacities. Infection and/or alveolar edema. Small left pleural effusion.  1/20 - CXR - Stable bilateral infiltrates left greater than right.   MEDICATIONS: Infusions    . cisatracurium (NIMBEX) infusion 2 mcg/kg/min (07/23/12 0441)  . dextrose    . fentaNYL infusion INTRAVENOUS 100 mcg/hr (07/23/12  0651)  . insulin (NOVOLIN-R) infusion 2.6 Units/hr (07/23/12 0645)  . midazolam (VERSED) infusion 2 mg/hr (07/23/12 0654)  . norepinephrine (LEVOPHED) Adult infusion Stopped (07/22/12 0830)  . phenylephrine (NEO-SYNEPHRINE) Adult infusion Stopped (07/21/12 1730)  . vasopressin (PITRESSIN) infusion - *FOR SHOCK* 0.03 Units/min (07/23/12 [id number])    Scheduled    . antiseptic oral rinse  15 mL Mouth Rinse QID  . artificial tears  1 application Both Eyes [id number]  . aspirin  81 mg Per Tube Daily  . ceFEPime (MAXIPIME) IV  1 g Intravenous Q24H  . chlorhexidine  15 mL Mouth Rinse BID  . feeding supplement (OXEPA)  1,000 mL Per Tube Q24H  . heparin  5,000 Units Subcutaneous Q8H  . hydrocortisone sod succinate (SOLU-CORTEF) injection  50 mg Intravenous Q6H  . insulin aspart  1-3 Units Subcutaneous Q4H  . insulin glargine  25 Units Subcutaneous Q24H  . levofloxacin (LEVAQUIN) IV  750 mg Intravenous Q48H  . oseltamivir  150 mg Per Tube Daily  . pantoprazole (PROTONIX) IV  40 mg Intravenous QHS  . vancomycin  1,500 mg Intravenous Q48H     DIAGNOSES: Active Problems:  Acute respiratory failure with hypoxia  ARDS (adult respiratory distress syndrome)  Acute renal failure   ASSESSMENT / PLAN:  PULMONARY  Lab 07/23/12 0500 07/22/12 1756 07/22/12 1608  PHART 7.302* 7.344* 7.315*  PCO2ART 59.7* 58.1* 60.3*  PO2ART 94.4 77.0* 76.0*  HCO3 28.7* 31.6* 30.7*  TCO2 30.5 33 33   A:  1) Multilobar PNA - pending respiratory viral panel, sputum culture per tracheal aspirate 2) Influenza A/ H1N1 infection 3) Severe ARDS - continued improvement, now paO2/FiO2 = 104.  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 - Continue Tamilfu day 4 of 10 - Continue per ARDS protocol. - Wean Nimbex - Permissive hypercapnia ok - want pH > 7.2. - Maintain low tidal volume ventilation. - Continue bronchodilators. - Will need smoking cessation education after extubation.  CARDIOVASCULAR  Lab 07/21/12  0626 07/21/12 0026 07/20/12 2010  CKTOTAL -- -- --  CKMB -- -- --  TROPONINI 0.63* 0.50* 0.46*   No results found for this basename: PROBNP:3 in the last 168 hours A:  1) Sepsis - 22/ multilobar PNA, influenza A/ H1N1 2) Elevated troponins - question of demand ischemia vs NSTEMI in setting of sepsis vs renal failure. No new EKG changes. 3) AAA - 6.4 cm per CT Abd 1/19.  Will continue to monitor unless developing hypotension or hemodynamic compromise. 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  P:  - Wean pressors for MAP goal > 65, CVP goal 8-12. - Treat for sepsis - see ID section. - Monitor AAA for now, low threshold for vascular consult if worsening hemodynamically will monitor for now. - Continue aspirin 81mg   daily.  RENAL  Lab 07/23/12 0440 07/22/12 0417 07/21/12 0449  NA 132* 127* 125*  K 4.1 3.4* 4.0  CL 90* 87* 90*  CO2 27 27 20   BUN 110* 94* 94*  CREATININE 3.69* 3.74* 3.69*  GLUCOSE 219* 133* 254*   A:  1) Acute renal failure  - likely 2/2 volume depletion in setting of sepsis, possible ATN in setting of hypotension. 2) Hypokalemia - mild, likely 2/2 NPO status. Replete.  3) Hyponatremia - relative, corrects to BG.   P:  - Continue to monitor. - Renally dose medications. - Continue tube feeds.  GASTROINTESTINAL  Lab 07/23/12 0440 07/22/12 0417 07/20/12 2010  AST 37 34 36  ALT 33 30 42  ALKPHOS 155* 75 64  BILITOT 0.5 0.6 0.8  PROT 6.3 5.5* 6.0  ALBUMIN 1.6* 1.5* 1.9*   A: No acute issues. P:  - Continue tube feeds.  HEMATOLOGIC  Lab 07/23/12 0440 07/22/12 0417 07/21/12 0449 07/20/12 2010  WBC 25.1* 17.4* 14.7* --  HGB 11.1* 10.6* 11.4* --  HCT 32.8* 30.8* 33.3* --  PLT 157 133* 143* --  APTT -- -- -- 35  INR -- -- -- 1.34   A:  1) Acute normocytic anemia - likely related to critical illness, unknown baseline. No obvious source of bleed. 2) Thrombocytopenia - unknown baseline, low normal in 05/2008. No obvious source of  bleed. Likely related to critical illness. P:  - Continue to monitor CBC.  INFECTIOUS  Lab 07/23/12 0440 07/22/12 0417 07/21/12 1029 07/21/12 0449 07/20/12 2010 07/20/12 1900  WBC 25.1* 17.4* -- 14.7* -- --  NEUTROABS 23.8* -- -- -- 9.8* --  LATICACIDVEN -- -- -- -- -- 1.6  PROCALCITON 4.39 6.50 8.24 -- -- --   A:  1) Sepsis secondary to multilobar PNA, possible influenza, 2) S. Pneumonia bacteremia - 2/2 blood cultures positive per [facility] at [facility] - pending official report of susceptibilities  3) Leukocytosis - likely worsened since 1/19 after stress dose steroids added.  P:  - Cortisol of 33, continue steroids until stable off of pressors - possible dc tomorrow. - Continue Vanc, Levaquin, Cefipime - until cultures available. - Continue Tamiflu day 4/10.  - Spoke with [facility] 07/23/2012 8:00AM.   ENDOCRINE / RHEUMATOLOGIC  Lab 07/23/12 0344 07/23/12 0003 07/22/12 2002 07/22/12 1713 07/22/12 1159  GLUCAP 227* 216* 182* 184* 213*   A: 1) Prediabetes - blood sugars stable overnight. P:  - ICU hyperglycemia protocol. - Lantus 25 units.  - Will escalate to 28 units, and escalate SSI - Check A1c.  NEUROLOGIC / PSYCHIATRIC RAAS Goal: -3 CAM-ICU: Cannot assess     A: 1) Acute encephalopathy - in setting of sepsis.  P:  - Continue with RAAS goal of -3. - Nimbex - goal vent synchrony - plan to wean Nimbex today.   CLINICAL SUMMARY: 89 yoM with HTN, CHF admitted to [facility] MICU on transfer from [facility] on 07/20/2012 secondary to sepsis in setting  of influenza A/ H1N1 / multilobar PNA. Intubated in ED secondary to hypoxic respiratory failure. Has ARDS, continuing ARD protocol, adding Nimbex 1/19, on pressors. Empirically treating with broad spectrum ABx while awaiting sensitivities. Prognosis guarded.   Signed: Chilton Greathouse, D.OKimberlee Nearing, Internal Medicine Resident Pager: [phone] (7AM-5PM)  Will continue full vent support, keep off  pressors, maintain current treatment.  Spoke with the wife, informed for renal, cardiac and respiratory failure.  Will need diureses once BP and renal function are more stable.  Continue  TF.  Family meeting towards the end of the week for ?trach.  CC time 35 min.  Patient seen and examined, agree with above note.  I dictated the care and orders written for this patient under my direction.  Jennet Maduro, M.D. [phone]

## 2012-07-23 NOTE — Progress Notes (Signed)
NUTRITION FOLLOW UP  Intervention:    Increase Oxepa to goal rate of 30 ml/h with Prostat 60 ml TID to provide 1680 kcals (25 kcals/kg ideal weight), 135 gm protein, 565 ml free water daily.  Add liquid MVI daily to meet 100% DRI's.  Nutrition Dx:   Inadequate oral intake related to inability to eat as evidenced by NPO status, ongoing.  Goal:   Enteral nutrition to provide 60-70% of estimated calorie needs (22-25 kcals/kg ideal body weight) and >/= 90% of estimated protein needs, based on ASPEN guidelines for permissive underfeeding in critically ill obese individuals, unmet.  Monitor:   TF tolerance/adequacy, weight trend, labs, vent status.  Assessment:   Patient remains intubated on ventilator support.  MV: 13.7 Temp:Temp (24hrs), Avg:98.4 F (36.9 C), Min:98 F (36.7 C), Max:98.9 F (37.2 C)  Tolerating TF well via OG tube.  Spoke with CCM physician, okay to advance TF today.   Height: Ht Readings from Last 1 Encounters:  07/20/12 5\' 7"  (1.702 m)    Weight Status:   Wt Readings from Last 1 Encounters:  07/23/12 249 lb 1.9 oz (113 kg)    Re-estimated needs:  Kcal: 2275 Protein: >134 gm Fluid: 2.3-2.5 L  Skin: no problems noted  Diet Order:  Oxepa at 20 ml/h providing 720 kcals, 30 gm protein, 377 ml free water daily   Intake/Output Summary (Last 24 hours) at 07/23/12 1357 Last data filed at 07/23/12 1200  Gross per 24 hour  Intake 1804.35 ml  Output   1280 ml  Net 524.35 ml    Last BM: 1/16   Labs:   Lab 07/23/12 0440 07/22/12 0417 07/21/12 0449  NA 132* 127* 125*  K 4.1 3.4* 4.0  CL 90* 87* 90*  CO2 27 27 20   BUN 110* 94* 94*  CREATININE 3.69* 3.74* 3.69*  CALCIUM 7.1* 6.9* 7.3*  MG 2.4 1.8 1.7  PHOS 7.0* 4.9* 4.7*  GLUCOSE 219* 133* 254*    CBG (last 3)   Basename 07/23/12 1216 07/23/12 0344 07/23/12 0003  GLUCAP 155* 227* 216*    Scheduled Meds:   . antiseptic oral rinse  15 mL Mouth Rinse QID  . artificial tears  1 application  Both Eyes [id number]  . aspirin  81 mg Per Tube Daily  . ceFEPime (MAXIPIME) IV  1 g Intravenous Q24H  . chlorhexidine  15 mL Mouth Rinse BID  . feeding supplement (OXEPA)  1,000 mL Per Tube Q24H  . heparin  5,000 Units Subcutaneous Q8H  . hydrocortisone sod succinate (SOLU-CORTEF) injection  50 mg Intravenous Q6H  . insulin aspart  2-6 Units Subcutaneous Q4H  . levofloxacin (LEVAQUIN) IV  750 mg Intravenous Q48H  . oseltamivir  150 mg Per Tube Daily  . pantoprazole (PROTONIX) IV  40 mg Intravenous QHS  . vancomycin  1,250 mg Intravenous Q24H    Continuous Infusions:   . cisatracurium (NIMBEX) infusion 2 mcg/kg/min (07/23/12 0800)  . fentaNYL infusion INTRAVENOUS 100 mcg/hr (07/23/12 0651)  . insulin (NOVOLIN-R) infusion 1.8 Units/hr (07/23/12 1000)  . midazolam (VERSED) infusion 2 mg/hr (07/23/12 0654)  . norepinephrine (LEVOPHED) Adult infusion Stopped (07/22/12 0830)  . phenylephrine (NEO-SYNEPHRINE) Adult infusion Stopped (07/21/12 1730)  . vasopressin (PITRESSIN) infusion - *FOR SHOCK* 0.03 Units/min (07/23/12 0652)    Molli Barrows, RD, LDN, CNSC Pager# [phone] After Hours Pager# [phone]

## 2012-07-24 ENCOUNTER — Inpatient Hospital Stay (HOSPITAL_COMMUNITY): Payer: BC Managed Care – PPO

## 2012-07-24 LAB — BASIC METABOLIC PANEL
BUN: 131 mg/dL — ABNORMAL HIGH (ref 6–23)
Calcium: 7.6 mg/dL — ABNORMAL LOW (ref 8.4–10.5)
Creatinine, Ser: 3.38 mg/dL — ABNORMAL HIGH (ref 0.50–1.35)
GFR calc Af Amer: 20 mL/min — ABNORMAL LOW (ref >90)

## 2012-07-24 LAB — RESPIRATORY VIRUS PANEL
Adenovirus: NOT DETECTED
Influenza A H3: NOT DETECTED
Metapneumovirus: NOT DETECTED
Respiratory Syncytial Virus A: NOT DETECTED
Respiratory Syncytial Virus B: NOT DETECTED
Rhinovirus: NOT DETECTED

## 2012-07-24 LAB — CBC WITH DIFFERENTIAL/PLATELET
Basophils Relative: 0 % (ref 0–1)
Eosinophils Absolute: 0 10*3/uL (ref 0.0–0.7)
Eosinophils Relative: 0 % (ref 0–5)
Lymphs Abs: 0.5 10*3/uL — ABNORMAL LOW (ref 0.7–4.0)
MCH: 29.8 pg (ref 26.0–34.0)
MCHC: 33.9 g/dL (ref 30.0–36.0)
MCV: 87.9 fL (ref 78.0–100.0)
Monocytes Absolute: 0.5 10*3/uL (ref 0.1–1.0)
Neutrophils Relative %: 92 % — ABNORMAL HIGH (ref 43–77)
Platelets: 184 10*3/uL (ref 150–400)
RBC: 3.72 MIL/uL — ABNORMAL LOW (ref 4.22–5.81)

## 2012-07-24 LAB — POCT I-STAT 3, ART BLOOD GAS (G3+)
Bicarbonate: 29.6 mEq/L — ABNORMAL HIGH (ref 20.0–24.0)
O2 Saturation: 88 %
pCO2 arterial: 56.7 mmHg — ABNORMAL HIGH (ref 35.0–45.0)
pO2, Arterial: 60 mmHg — ABNORMAL LOW (ref 80.0–100.0)

## 2012-07-24 LAB — GLUCOSE, CAPILLARY
Glucose-Capillary: 195 mg/dL — ABNORMAL HIGH (ref 70–99)
Glucose-Capillary: 198 mg/dL — ABNORMAL HIGH (ref 70–99)

## 2012-07-24 LAB — HEMOGLOBIN A1C
Hgb A1c MFr Bld: 6.4 % — ABNORMAL HIGH (ref <5.7)
Mean Plasma Glucose: 137 mg/dL — ABNORMAL HIGH (ref <117)

## 2012-07-24 LAB — MAGNESIUM: Magnesium: 2.5 mg/dL (ref 1.5–2.5)

## 2012-07-24 MED ORDER — FUROSEMIDE 10 MG/ML IJ SOLN
10.0000 mg/h | INTRAVENOUS | Status: AC
  Administered 2012-07-24: 10 mg/h via INTRAVENOUS
  Filled 2012-07-24: qty 25

## 2012-07-24 MED ORDER — METOLAZONE 5 MG PO TABS
5.0000 mg | ORAL_TABLET | Freq: Every day | ORAL | Status: AC
  Administered 2012-07-24: 5 mg via ORAL
  Filled 2012-07-24: qty 1

## 2012-07-24 MED ORDER — POTASSIUM CHLORIDE 20 MEQ/15ML (10%) PO LIQD
40.0000 meq | Freq: Three times a day (TID) | ORAL | Status: AC
  Administered 2012-07-24 (×2): 40 meq
  Filled 2012-07-24 (×2): qty 30

## 2012-07-24 MED ORDER — HYDROCORTISONE SOD SUCCINATE 100 MG IJ SOLR
50.0000 mg | Freq: Three times a day (TID) | INTRAMUSCULAR | Status: DC
  Administered 2012-07-24 – 2012-07-25 (×3): 50 mg via INTRAVENOUS
  Filled 2012-07-24 (×6): qty 1

## 2012-07-24 MED ORDER — POTASSIUM CHLORIDE 20 MEQ/15ML (10%) PO LIQD
ORAL | Status: AC
  Filled 2012-07-24: qty 30

## 2012-07-24 NOTE — Progress Notes (Signed)
Attempted to decrease Nimbex; pt desaturates.  Suctioned pt and turned with no improvement. Koury RRT called to assess pt.

## 2012-07-24 NOTE — Progress Notes (Signed)
PULMONARY  / CRITICAL CARE MEDICINE  Name: [name] MRN: [id number] DOB: 01/07/1941    LOS: 33  REFERRING MD :  [name]  CHIEF COMPLAINT:  Respiratory failure and pneumonia  BRIEF PATIENT DESCRIPTION: 55 yoM with HTN, CHF admitted to [facility] on 1/18 on transfer from [facility] ER with sepsis and ARDS secondary to influenza A/H1N1 multifocal pneumonia.   LINES / TUBES: L. CVL 1/18 >>  ETT 1/18 >>  CULTURES: [facility] [phone]): 1/18 Blood culture x 2 >> strep pneumoniae in 2 of 2 pansenstive (awaiting fax confirmation)  [place]: 1/18 BC >> NGTD 1/18 Respiratory virus panel >>>  1/18 UC >> negative 1/18 Flu PCR >> Influenza A/ H1N1 1/18 lactate >> 2.3  1/19 PCT >> 8 >> 6.5 1/19 Resp: rare GPC in pairs.   ANTIBIOTICS: 1/18 Vanc >>  1/18 Levaquin >>  1/18 Tamiflu >> 1/18 Cefepime >>  SIGNIFICANT EVENTS:  1/18 Transfer to [facility] from [place] for respiratory failure started on Nimbex protocol for SEVERE ARDS.   LEVEL OF CARE:  ICU PRIMARY SERVICE:  PCCM CONSULTANTS:  None CODE STATUS: Full DIET:  Tube feeds DVT Px:  Heparin SQ GI Px:  Protonix  HISTORY OF PRESENT ILLNESS:  72 yo male with known hx of HTN, CHF presented to [facility] ER on 1/18 w/ 1 week hx of cough , congestion w/ progressively worsening dyspnea . Hypoxia in ER , decompensated requiring intubation . CXR w/ multilobar PNA . Hypotension unresponsive to fluid challenged requiring pressors. CCM asked to accept at [facility] ICU 1/18. Transported via [facility]link . Renal failure w/ scr ~4 . Received 6L + in ER.   SUBJECTIVE / INTERVAL HISTORY:  Intubated, sedated, paralyzed.  Interval History: - able to wean off all pressors 1/21, Fluid even yesterday.   - Remains on ARDS protocol, 70% FiO2 yesterday with PEEP of 12. Sating low 90s.   VITAL SIGNS: Temp:  [98 F (36.7 C)-98.8 F (37.1 C)] 98.7 F (37.1 C) (01/22 0740) Pulse Rate:  [95-126] 121  (01/22 0600) Resp:  [22-35] 35  (01/22 0600) BP:  (92-150)/(57-85) 124/70 mmHg (01/22 0600) SpO2:  [88 %-97 %] 89 % (01/22 0600) Arterial Line BP: (89-171)/(53-83) 137/71 mmHg (01/22 0600) FiO2 (%):  [69.8 %-90 %] 70.2 % (01/22 0600) Weight:  [247 lb 5.7 oz (112.2 kg)] 247 lb 5.7 oz (112.2 kg) (01/22 0400) HEMODYNAMICS: CVP:  [11 mmHg-17 mmHg] 13 mmHg VENTILATOR SETTINGS: Vent Mode:  [-] PRVC FiO2 (%):  [69.8 %-90 %] 70.2 % Set Rate:  [35 bmp] 35 bmp Vt Set:  [400 mL] 400 mL PEEP:  [12 cmH20-14 cmH20] 12 cmH20 Plateau Pressure:  [24 cmH20-29 cmH20] 24 cmH20 INTAKE / OUTPUT: Intake/Output      01/21 0701 - 01/22 0700 01/22 0701 - 01/23 0700   I.V. (mL/kg) 1261.3 (11.2)    NG/GT 620    IV Piggyback 250    Total Intake(mL/kg) 2131.3 (19)    Urine (mL/kg/hr) 2095 (0.8)    Total Output 2095    Net +36.3           PHYSICAL EXAMINATION: General: Intubated and sedated, paralyzed.   Head: Normocephalic, atraumatic.  Lungs:  Normal respiratory effort. Mild bronchial breath sounds bilaterally.   Heart: Tachycardic rate with regular rhythm.   Abdomen:  BS hypoactive. Soft, mildly Distended, non-tender.   Extremities: No pretibial edema.     IMAGING:  1/18 - CXR  - Bilateral pulmonary opacities are stable consistent with pneumonia. ET tube 4.2 cm  above the carina.  1/19 - CT Abd - Abdominal aortic aneurysm measuring 6.4 cm AP diameter. 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 Scattered free fluid in the pelvis and pericolic gutters as well as right lower quadrant mesentery. No definite evidence of aneurysm rupture but the size of the aneurysm places the patient at increased risk. Bilateral lower lung consolidation and small pleural effusions.  1/19 - CXR - Minimal progression of right base air space disease. Otherwise, similar left greater than right pulmonary opacities. Infection and/or alveolar edema. Small left pleural effusion.  1/20 - CXR - Stable bilateral infiltrates left greater than right.  1/22 CXR:  mildly improved aeration on the right continued bilateral infiltrates.  ?wedge like consolidation in the RLL?   MEDICATIONS: Infusions    . cisatracurium (NIMBEX) infusion 2.5 mcg/kg/min (07/24/12 0600)  . fentaNYL infusion INTRAVENOUS 200 mcg/hr (07/23/12 2100)  . insulin (NOVOLIN-R) infusion 1.8 Units/hr (07/23/12 1000)  . midazolam (VERSED) infusion 2 mg/hr (07/24/12 0000)  . norepinephrine (LEVOPHED) Adult infusion Stopped (07/22/12 0830)  . phenylephrine (NEO-SYNEPHRINE) Adult infusion Stopped (07/21/12 1730)  . vasopressin (PITRESSIN) infusion - *FOR SHOCK* 0.03 Units/min (07/23/12 [id number])   Scheduled    . antiseptic oral rinse  15 mL Mouth Rinse QID  . artificial tears  1 application Both Eyes [id number]  . aspirin  81 mg Per Tube Daily  . ceFEPime (MAXIPIME) IV  1 g Intravenous Q24H  . chlorhexidine  15 mL Mouth Rinse BID  . feeding supplement (OXEPA)  1,000 mL Per Tube Q24H  . feeding supplement  60 mL Per Tube TID  . heparin  5,000 Units Subcutaneous Q8H  . hydrocortisone sod succinate (SOLU-CORTEF) injection  50 mg Intravenous Q6H  . insulin aspart  2 Units Subcutaneous Q4H  . insulin aspart  2-6 Units Subcutaneous Q4H  . insulin glargine  15 Units Subcutaneous Q24H  . levofloxacin (LEVAQUIN) IV  750 mg Intravenous Q48H  . multivitamin  5 mL Per Tube Daily  . oseltamivir  150 mg Per Tube Daily  . pantoprazole (PROTONIX) IV  40 mg Intravenous QHS  . vancomycin  1,250 mg Intravenous Q24H   DIAGNOSES: Active Problems:  Acute respiratory failure with hypoxia  ARDS (adult respiratory distress syndrome)  Acute renal failure  ASSESSMENT / PLAN:  PULMONARY  Lab 07/23/12 1735 07/23/12 0500 07/22/12 1756  PHART 7.322* 7.302* 7.344*  PCO2ART 56.5* 59.7* 58.1*  PO2ART 66.0* 94.4 77.0*  HCO3 29.3* 28.7* 31.6*  TCO2 31 30.5 33   A:  1) Multilobar PNA - pending respiratory viral panel though flu PCR positive for H1N1. sputum culture from [place] showed S. Pneumo.  [facility] sputum  culture shows GPC in pairs which is likely S. pneumo 2) Influenza A/ H1N1 infection:  3) Severe ARDS - continued improvement, now paO2/FiO2 = 104.  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 - Continue Tamilfu day 4 of 10 renally dosed.  - Continue per ARDS protocol, will attempt to decrease paralytics, if tolerated will consider APRV.  - Permissive hypercapnia ok - want pH > 7.2. - Maintain low tidal volume ventilation. - Continue bronchodilators. - Will need smoking cessation education after extubation.  CARDIOVASCULAR  Lab 07/21/12 0626 07/21/12 0026 07/20/12 2010  CKTOTAL -- -- --  CKMB -- -- --  TROPONINI 0.63* 0.50* 0.46*   A:  1) Sepsis - 22/ multilobar PNA, influenza A/ H1N1 2) Elevated troponins - question of demand ischemia vs NSTEMI in  setting of sepsis vs renal failure. No new EKG changes.  3) AAA - 6.4 cm per CT Abd 1/19.  Will continue to monitor unless developing hypotension or hemodynamic compromise. 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  P:  - Check one Trop today to ensure trending down.  - Monitor AAA for now, low threshold for vascular consult if worsening hemodynamically will monitor for now.  Will need repair when better given size of AAA.  - Continue aspirin 81mg  daily.  RENAL  Lab 07/24/12 0427 07/23/12 0440 07/22/12 0417  NA 136 132* 127*  K 3.7 4.1 3.4*  CL 95* 90* 87*  CO2 28 27 27   BUN 131* 110* 94*  CREATININE 3.38* 3.69* 3.74*  GLUCOSE 223* 219* 133*   A:  1) Acute renal failure  - likely 2/2 volume depletion in setting of sepsis, possible ATN in setting of hypotension, mildly improved over the last few days.  CVP stable 11-13.  UOP picking up.  2) Hypokalemia - repleted yesterday 3) Hyponatremia - relative, corrects to BG.   P:  - Continue to monitor. - Renally dose medications. - Continue tube feeds. - Lasix drip with zaroxolyn and K replacement.  GASTROINTESTINAL  Lab 07/23/12 0440 07/22/12 0417  07/20/12 2010  AST 37 34 36  ALT 33 30 42  ALKPHOS 155* 75 64  BILITOT 0.5 0.6 0.8  PROT 6.3 5.5* 6.0  ALBUMIN 1.6* 1.5* 1.9*   A: No acute issues. P:  - Continue tube feeds.  HEMATOLOGIC  Lab 07/24/12 0427 07/23/12 0440 07/22/12 0417 07/20/12 2010  WBC 13.7* 25.1* 17.4* --  HGB 11.1* 11.1* 10.6* --  HCT 32.7* 32.8* 30.8* --  PLT 184 157 133* --  APTT -- -- -- 35  INR -- -- -- 1.34   A:  1) Acute normocytic anemia - likely related to critical illness, unknown baseline. No obvious source of bleed. 2) Thrombocytopenia - unknown baseline, low normal in 05/2008. No obvious source of bleed. Likely related to critical illness. P:  - Continue to monitor CBC.  INFECTIOUS  Lab 07/24/12 0427 07/23/12 0440 07/22/12 0417 07/21/12 1029 07/20/12 2010 07/20/12 1900  WBC 13.7* 25.1* 17.4* -- -- --  NEUTROABS 12.7* 23.8* -- -- 9.8* --  LATICACIDVEN -- -- -- -- -- 1.6  PROCALCITON -- 4.39 6.50 8.24 -- --   A:  1) Sepsis secondary to multilobar PNA, possible influenza, 2) S. Pneumonia bacteremia - 2/2 blood cultures positive with S. Pneumo, pan sensitive from [place].  BC here clear so far.  Resp culture from [facility] showing [place] in pairs.  3. Leukocytosis - improving, continues to be afebrile.    P:  - Cortisol of 33, now that off pressors work to taper steroids down.  - Continue Vanc, Levaquin, Cefipime - Continue Tamiflu day 5/10.   ENDOCRINE / RHEUMATOLOGIC  Lab 07/24/12 0728 07/24/12 0423 07/23/12 2357 07/23/12 1951 07/23/12 1557  GLUCAP 195* 210* 198* 166* 141*   A: 1) Prediabetes - blood sugars stable overnight. P:  - ICU hyperglycemia protocol. - Lantus 15 units.  Will monitor with taper of steroids.  - Check A1c.  NEUROLOGIC / PSYCHIATRIC RAAS Goal: -3 CAM-ICU: Cannot assess     A: 1) Acute encephalopathy - in setting of sepsis.  P:  - Continue with RAAS goal of -3. - Nimbex - goal vent synchrony   CLINICAL SUMMARY: 25 yoM with HTN, CHF admitted to [facility] MICU on  transfer from [facility] on 07/20/2012 secondary to sepsis in setting  of influenza A/ H1N1 / multilobar PNA. Intubated in ED secondary to hypoxic respiratory failure. Has ARDS on the protocol nimbex now since 1/19.  Off pressors since yesterday now so will need to work to d/c nimbex.  Borderline sats.  Empirically treating with broad spectrum ABx at this time.  Cultures from [place] showed S. pneumo and Resp culture at [facility] show [place] in pairs.  When sens available here will plan to narrow and decide on length given [name] was Blood cultures.   SignedTrish Fountain, MD Internal Medicine Resident, PGY III Co-Chief Resident, Internal Medicine Pager: [phone] 07/24/2012 8:26 AM   CC time 35 min.  Patient seen and examined, agree with above note.  I dictated the care and orders written for this patient under my direction.  Rush Farmer, M.D. [facility] Pulmonary/Critical Care Medicine. Pager: [phone]. After hours pager: [phone].

## 2012-07-24 NOTE — Progress Notes (Signed)
Recruitment maneuver performed at this time.

## 2012-07-24 NOTE — Progress Notes (Signed)
Unable to reduce Nimbex due to pt desaturates.

## 2012-07-25 ENCOUNTER — Inpatient Hospital Stay (HOSPITAL_COMMUNITY): Payer: BC Managed Care – PPO

## 2012-07-25 LAB — POCT I-STAT 3, ART BLOOD GAS (G3+)
Acid-Base Excess: 3 mmol/L — ABNORMAL HIGH (ref 0.0–2.0)
Patient temperature: 98.7
pH, Arterial: 7.3 — ABNORMAL LOW (ref 7.350–7.450)
pO2, Arterial: 79 mmHg — ABNORMAL LOW (ref 80.0–100.0)

## 2012-07-25 LAB — BASIC METABOLIC PANEL
BUN: 143 mg/dL — ABNORMAL HIGH (ref 6–23)
CO2: 30 mEq/L (ref 19–32)
Calcium: 8.7 mg/dL (ref 8.4–10.5)
Chloride: 101 mEq/L (ref 96–112)
Creatinine, Ser: 2.8 mg/dL — ABNORMAL HIGH (ref 0.50–1.35)
Glucose, Bld: 187 mg/dL — ABNORMAL HIGH (ref 70–99)

## 2012-07-25 LAB — CBC
HCT: 35.1 % — ABNORMAL LOW (ref 39.0–52.0)
Hemoglobin: 11.6 g/dL — ABNORMAL LOW (ref 13.0–17.0)
MCH: 29.7 pg (ref 26.0–34.0)
MCV: 90 fL (ref 78.0–100.0)
Platelets: 175 10*3/uL (ref 150–400)
RBC: 3.9 MIL/uL — ABNORMAL LOW (ref 4.22–5.81)
WBC: 12.5 10*3/uL — ABNORMAL HIGH (ref 4.0–10.5)

## 2012-07-25 LAB — MAGNESIUM: Magnesium: 2.4 mg/dL (ref 1.5–2.5)

## 2012-07-25 LAB — GLUCOSE, CAPILLARY
Glucose-Capillary: 193 mg/dL — ABNORMAL HIGH (ref 70–99)
Glucose-Capillary: 176 mg/dL — ABNORMAL HIGH (ref 70–99)
Glucose-Capillary: 187 mg/dL — ABNORMAL HIGH (ref 70–99)

## 2012-07-25 MED ORDER — DEXTROSE 5 % IV SOLN
2.0000 g | INTRAVENOUS | Status: DC
  Administered 2012-07-25: 2 g via INTRAVENOUS
  Filled 2012-07-25 (×2): qty 2

## 2012-07-25 MED ORDER — FUROSEMIDE 10 MG/ML IJ SOLN
10.0000 mg/h | INTRAVENOUS | Status: DC
  Administered 2012-07-25: 10 mg/h via INTRAVENOUS
  Filled 2012-07-25: qty 25

## 2012-07-25 MED ORDER — METOLAZONE 5 MG PO TABS
5.0000 mg | ORAL_TABLET | Freq: Every day | ORAL | Status: AC
  Administered 2012-07-25: 5 mg via ORAL
  Filled 2012-07-25: qty 1

## 2012-07-25 MED ORDER — HYDROCORTISONE SOD SUCCINATE 100 MG IJ SOLR
50.0000 mg | Freq: Two times a day (BID) | INTRAMUSCULAR | Status: DC
  Administered 2012-07-25 – 2012-07-27 (×4): 50 mg via INTRAVENOUS
  Filled 2012-07-25 (×6): qty 1

## 2012-07-25 NOTE — Progress Notes (Signed)
[name] troponin continues to be mildly elevated, today at 0.64. On 07/21/12 it was 0.63. I paged the on call cardiologist. In speaking with Cardiology, and he said that given [name] continuously elevated Cr (2.8 today), and that his Troponin levels have been stable, [name] is likely not clearing the troponin, and at this time, his mildly elevated, stable troponin level is not worrisome. His EKG is unchanged from previous. We will continue to monitor his troponin levels, and if they begin to rise or if we have clinical suspicion of a cardiac event, we will again call Cardiology to evaluate.

## 2012-07-25 NOTE — Progress Notes (Signed)
PULMONARY  / CRITICAL CARE MEDICINE  Name: JAYNE GERRITS MRN: [id number] DOB: 30-Nov-1940    LOS: 82  REFERRING MD :  [name]  CHIEF COMPLAINT:  Respiratory failure and pneumonia  BRIEF PATIENT DESCRIPTION: 72 yoM with HTN, CHF admitted to [facility] on 1/18 on transfer from [facility] ER with sepsis and ARDS secondary to influenza A/H1N1 multifocal pneumonia.   LINES / TUBES: L. CVL 1/18 >>  ETT 1/18 >>  CULTURES: [facility] [phone]): 1/18 Blood culture x 2 >> strep pneumoniae in 2 of 2 pansenstive (awaiting fax confirmation)  [place]: 1/18 BC >> NGTD 1/18 Respiratory virus panel >>>  1/18 UC >> negative 1/18 Flu PCR >> Influenza A/ H1N1 1/18 lactate >> 2.3  1/19 PCT >> 8 >> 6.5 1/19 Resp: rare GPC in pairs. No growth at 2 days.    ANTIBIOTICS: 1/18 Vanc >> 1/23 1/18 Levaquin >> 1/23 1/18 Tamiflu >> 1/18 Cefepime >>1/23 1/23 Rocephin 1/23>>>  SIGNIFICANT EVENTS:  1/18 Transfer to [facility] from [place] for respiratory failure started on Nimbex protocol for SEVERE ARDS.   LEVEL OF CARE:  ICU PRIMARY SERVICE:  PCCM CONSULTANTS:  None CODE STATUS: Full DIET:  Tube feeds DVT Px:  Heparin SQ GI Px:  Protonix  HISTORY OF PRESENT ILLNESS:  72 yo male with known hx of HTN, CHF presented to [facility] ER on 1/18 w/ 1 week hx of cough , congestion w/ progressively worsening dyspnea . Hypoxia in ER , decompensated requiring intubation . CXR w/ multilobar PNA . Hypotension unresponsive to fluid challenged requiring pressors. CCM asked to accept at [facility] ICU 1/18. Transported via [facility] . Renal failure w/ scr ~4 . Received 6L + in ER.   SUBJECTIVE / INTERVAL HISTORY:  Intubated, sedated, paralyzed.  Unable to wean nimbex yesterday without desaturations.  Made almost 3.4L of urine with the lasix drip yesterday. Remains off pressors. afebrile  Interval History: - Remains on ARDS protocol, 80% FiO2 yesterday with PEEP of 14. Sating mid 90s.  - Making good urine and Cr coming  down. BUN still awful.   VITAL SIGNS: Temp:  [98.2 F (36.8 C)-98.9 F (37.2 C)] 98.7 F (37.1 C) (01/23 0830) Pulse Rate:  [99-122] 120  (01/23 0804) Resp:  [4-35] 35  (01/23 0804) BP: (99-147)/(56-92) 132/66 mmHg (01/23 0804) SpO2:  [85 %-95 %] 95 % (01/23 0804) Arterial Line BP: (107-168)/(55-81) 126/63 mmHg (01/23 0700) FiO2 (%):  [69.8 %-100 %] 79.5 % (01/23 0804) Weight:  [249 lb 1.9 oz (113 kg)] 249 lb 1.9 oz (113 kg) (01/23 0400) HEMODYNAMICS: CVP:  [13 mmHg-15 mmHg] 14 mmHg VENTILATOR SETTINGS: Vent Mode:  [-] PRVC FiO2 (%):  [69.8 %-100 %] 79.5 % Set Rate:  [35 bmp] 35 bmp Vt Set:  [400 mL] 400 mL PEEP:  [9.8 cmH20-14.3 cmH20] 14 cmH20 Plateau Pressure:  [24 cmH20-28 cmH20] 27 cmH20 INTAKE / OUTPUT: Intake/Output      01/22 0701 - 01/23 0700 01/23 0701 - 01/24 0700   I.V. (mL/kg) 1542.3 (13.6)    NG/GT 955    IV Piggyback 400    Total Intake(mL/kg) 2897.3 (25.6)    Urine (mL/kg/hr) 3435 (1.3)    Total Output 3435    Net -537.7           PHYSICAL EXAMINATION: General: Intubated and sedated, paralyzed.   Head: Normocephalic, atraumatic.  Lungs:  Normal respiratory effort. Mild bronchial breath sounds bilaterally.   Heart: Tachycardic rate with regular rhythm.   Abdomen:  BS hypoactive. Soft, mildly  Distended, non-tender.   Extremities: No pretibial edema.    IMAGING:  1/18 - CXR  - Bilateral pulmonary opacities are stable consistent with pneumonia. ET tube 4.2 cm above the carina.  1/19 - CT Abd - Abdominal aortic aneurysm measuring 6.4 cm AP diameter. 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 Scattered free fluid in the pelvis and pericolic gutters as well as right lower quadrant mesentery. No definite evidence of aneurysm rupture but the size of the aneurysm places the patient at increased risk. Bilateral lower lung consolidation and small pleural effusions.  1/19 - CXR - Minimal progression of right base air space disease. Otherwise,  similar left greater than right pulmonary opacities. Infection and/or alveolar edema. Small left pleural effusion.  1/20 - CXR - Stable bilateral infiltrates left greater than right.  1/22 CXR: mildly improved aeration on the right continued bilateral infiltrates.    1/22 CXR: unchanged.   MEDICATIONS: Infusions    . cisatracurium (NIMBEX) infusion 1.5 mcg/kg/min (07/25/12 0746)  . fentaNYL infusion INTRAVENOUS 200 mcg/hr (07/25/12 0600)  . furosemide (LASIX) infusion 10 mg/hr (07/25/12 0600)  . midazolam (VERSED) infusion 2 mg/hr (07/25/12 0600)  . norepinephrine (LEVOPHED) Adult infusion Stopped (07/22/12 0830)  . phenylephrine (NEO-SYNEPHRINE) Adult infusion Stopped (07/21/12 1730)  . vasopressin (PITRESSIN) infusion - *FOR SHOCK* 0.03 Units/min (07/23/12 [id number])   Scheduled    . antiseptic oral rinse  15 mL Mouth Rinse QID  . artificial tears  1 application Both Eyes [id number]  . aspirin  81 mg Per Tube Daily  . ceFEPime (MAXIPIME) IV  1 g Intravenous Q24H  . chlorhexidine  15 mL Mouth Rinse BID  . feeding supplement (OXEPA)  1,000 mL Per Tube Q24H  . feeding supplement  60 mL Per Tube TID  . heparin  5,000 Units Subcutaneous Q8H  . hydrocortisone sod succinate (SOLU-CORTEF) injection  50 mg Intravenous Q8H  . insulin aspart  2 Units Subcutaneous Q4H  . insulin aspart  2-6 Units Subcutaneous Q4H  . insulin glargine  15 Units Subcutaneous Q24H  . levofloxacin (LEVAQUIN) IV  750 mg Intravenous Q48H  . multivitamin  5 mL Per Tube Daily  . oseltamivir  150 mg Per Tube Daily  . pantoprazole (PROTONIX) IV  40 mg Intravenous QHS  . vancomycin  1,250 mg Intravenous Q24H   DIAGNOSES: Active Problems:  Acute respiratory failure with hypoxia  ARDS (adult respiratory distress syndrome)  Acute renal failure  ASSESSMENT / PLAN:  PULMONARY  Lab 07/25/12 0434 07/24/12 0854 07/23/12 1735  PHART 7.300* 7.325* 7.322*  PCO2ART 62.5* 56.7* 56.5*  PO2ART 79.0* 60.0* 66.0*  HCO3 30.7*  29.6* 29.3*  TCO2 33 31 31   A:  1) Multilobar PNA - respiratory viral panel shows influenza only.  flu PCR positive for H1N1. sputum culture from [place] showed S. Pneumo.  [facility] sputum culture shows GPC in pairs which is likely S. pneumo but there was no growth in the final culture.  2) Influenza A/ H1N1 infection: Positive flu PCR and now resp viral panel shows no other viruses.   3) Severe ARDS - continued improvement, now paO2/FiO2 = 104.  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 - Continue Tamilfu day 6 of 10 renally dosed.  - Continue per ARDS protocol, will attempt to decrease paralytics, if tolerated will consider APRV.  - Permissive hypercapnia ok - want pH > 7.2. - Maintain low tidal volume ventilation. - Continue bronchodilators. - Will need  smoking cessation education after extubation.  CARDIOVASCULAR  Lab 07/21/12 0626 07/21/12 0026 07/20/12 2010  CKTOTAL -- -- --  CKMB -- -- --  TROPONINI 0.63* 0.50* 0.46*   A:  1) Sepsis - 22/ multilobar PNA, influenza A/ H1N1 2) Elevated troponins - question of demand ischemia vs NSTEMI in setting of sepsis vs renal failure. No new EKG changes.  3) AAA - 6.4 cm per CT Abd 1/19.  Will continue to monitor unless developing hypotension or hemodynamic compromise. 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  P:  - Check one Trop today to ensure trending down.  - Monitor AAA for now, low threshold for vascular consult if worsening hemodynamically will monitor for now.  Will need repair when better given size of AAA.  - Continue aspirin 81mg  daily.  RENAL  Lab 07/25/12 0440 07/24/12 0427 07/23/12 0440  NA 142 136 132*  K 4.3 3.7 4.1  CL 101 95* 90*  CO2 30 28 27   BUN 143* 131* 110*  CREATININE 2.80* 3.38* 3.69*  GLUCOSE 187* 223* 219*   A:  1) Acute renal failure  - likely 2/2 volume depletion in setting of sepsis, possible ATN in setting of hypotension, mildly improved over the last few days.  CVP  stable 11-13.  UOP brisk with lasix drip.  Creatinine trending downward well but BUN still very high.  Likely ATN now in the polyuric phase. Consider change to IV bolus lasix.  2) Hypokalemia - repleted yesterday 3) Hyponatremia - relative, corrects to BG.   P:  - Continue to monitor. - Renally dose medications. - Continue tube feeds. - Lasix drip 10 mg/hr. - K supplementation.   GASTROINTESTINAL  Lab 07/23/12 0440 07/22/12 0417 07/20/12 2010  AST 37 34 36  ALT 33 30 42  ALKPHOS 155* 75 64  BILITOT 0.5 0.6 0.8  PROT 6.3 5.5* 6.0  ALBUMIN 1.6* 1.5* 1.9*   A: No acute issues. P:  - Continue tube feeds.  HEMATOLOGIC  Lab 07/25/12 0440 07/24/12 0427 07/23/12 0440 07/20/12 2010  WBC 12.5* 13.7* 25.1* --  HGB 11.6* 11.1* 11.1* --  HCT 35.1* 32.7* 32.8* --  PLT 175 184 157 --  APTT -- -- -- 35  INR -- -- -- 1.34   A:  1) Acute normocytic anemia - likely related to critical illness, unknown baseline. No obvious source of bleed. 2) Thrombocytopenia - unknown baseline, low normal in 05/2008. No obvious source of bleed. Likely related to critical illness. P:  - Continue to monitor CBC.  INFECTIOUS  Lab 07/25/12 0440 07/24/12 0427 07/23/12 0440 07/22/12 0417 07/21/12 1029 07/20/12 2010 07/20/12 1900  WBC 12.5* 13.7* 25.1* -- -- -- --  NEUTROABS -- 12.7* 23.8* -- -- 9.8* --  LATICACIDVEN -- -- -- -- -- -- 1.6  PROCALCITON -- -- 4.39 6.50 8.24 -- --   A:  1) Sepsis secondary to multilobar PNA, possible influenza, 2) S. Pneumonia bacteremia - 2/2 blood cultures positive with S. Pneumo, pan sensitive from [place].  BC here clear so far.  Resp culture from [facility] showing [place] in pairs but no growth on final culture. Plan for narrowing of therapy to ceftriaxone for 14 days.  Today (1/23) is day 6 of antibiotics  3. Leukocytosis - improving, continues to be afebrile.    P:  - Cortisol of 33, Continue to taper steroids down.  - Consider narrowing from Vanc, Levaquin, Cefipime to  ceftriaxone given sensitivities from blood culture  from [facility] 6/14 days.  - Continue Tamiflu day 6/10.   ENDOCRINE / RHEUMATOLOGIC  Lab 07/25/12 0819 07/25/12 0333 07/24/12 2329 07/24/12 1942 07/24/12 1551  GLUCAP 173* 176* 193* 171* 199*   A: 1) Prediabetes - blood sugars stable overnight. P:  - ICU hyperglycemia protocol. - Lantus 15 units.  Will monitor with taper of steroids.  - Check A1c.  NEUROLOGIC / PSYCHIATRIC RAAS Goal: -3 CAM-ICU: Cannot assess     A: 1) Acute encephalopathy - in setting of sepsis.  P:  - Continue with RAAS goal of -3. - Nimbex - goal vent synchrony   CLINICAL SUMMARY: 89 yoM with HTN, CHF admitted to [facility] MICU on transfer from [facility] on 07/20/2012 secondary to sepsis in setting  of influenza A/ H1N1 / multilobar PNA. Intubated in ED secondary to hypoxic respiratory failure. Has ARDS on the protocol nimbex now since 1/19.  And will work to try to titrate down the nimbex as sats allow.  Empirically treating with broad spectrum ABx at this time but will plan to narrow based on blood culture results from [place] of pan sensitive S. Pneumo.  Rocephin for 14 days of total treatment.   SignedTrish Fountain, MD Internal Medicine Resident, PGY III Co-Chief Resident, Internal Medicine Pager: [phone] 07/25/2012 8:42 AM   CC time 35 min.  Patient seen and examined, agree with above note.  I dictated the care and orders written for this patient under my direction.  Rush Farmer, MD [phone]

## 2012-07-25 NOTE — Progress Notes (Signed)
ANTIBIOTIC CONSULT NOTE - INITIAL  Pharmacy Consult for Ceftriaxone Indication: r/o bacteremia   No Known Allergies  Patient Measurements: Height: 5\' 7"  (170.2 cm) Weight: 249 lb 1.9 oz (113 kg) IBW/kg (Calculated) : 66.1    Vital Signs: Temp: 98.7 F (37.1 C) (01/23 0830) Temp src: Oral (01/23 0830) BP: 110/59 mmHg (01/23 1113) Pulse Rate: 115  (01/23 1113) Intake/Output from previous day: 01/22 0701 - 01/23 0700 In: 2897.3 [I.V.:1542.3; NG/GT:955; IV Piggyback:400] Out: [id number] [Urine:3435] Intake/Output from this shift: Total I/O In: 399.9 [I.V.:189.9; NG/GT:210] Out: 900 [Urine:900]  Labs:  [facility] 07/25/12 0440 07/24/12 0427 07/23/12 0440  WBC 12.5* 13.7* 25.1*  HGB 11.6* 11.1* 11.1*  PLT 175 184 157  LABCREA -- -- --  CREATININE 2.80* 3.38* 3.69*  Estimated Creatinine Clearance: 29.5 ml/min (by C-G formula based on Cr of 2.8)   Microbiology: Recent Results (from the past 720 hour(s))  CULTURE, BLOOD (ROUTINE X 2)     Status: Normal (Preliminary result)   Collection Time   07/20/12  7:15 PM      Component Value Range Status Comment   Specimen Description BLOOD LEFT HAND   Final    Special Requests BOTTLES DRAWN AEROBIC AND ANAEROBIC 10CC   Final    Culture  Setup Time 07/21/2012 02:14   Final    Culture     Final    Value:        BLOOD CULTURE RECEIVED NO GROWTH TO DATE CULTURE WILL BE HELD FOR 5 DAYS BEFORE ISSUING A FINAL NEGATIVE REPORT   Report Status PENDING   Incomplete   CULTURE, BLOOD (ROUTINE X 2)     Status: Normal (Preliminary result)   Collection Time   07/20/12  7:30 PM      Component Value Range Status Comment   Specimen Description BLOOD LEFT HAND   Final    Special Requests BOTTLES DRAWN AEROBIC AND ANAEROBIC 10CC   Final    Culture  Setup Time 07/21/2012 02:14   Final    Culture     Final    Value:        BLOOD CULTURE RECEIVED NO GROWTH TO DATE CULTURE WILL BE HELD FOR 5 DAYS BEFORE ISSUING A FINAL NEGATIVE REPORT   Report Status PENDING    Incomplete   URINE CULTURE     Status: Normal   Collection Time   07/20/12  9:00 PM      Component Value Range Status Comment   Specimen Description URINE, CATHETERIZED   Final    Special Requests NONE   Final    Culture  Setup Time 07/21/2012 02:56   Final    Colony Count NO GROWTH   Final    Culture NO GROWTH   Final    Report Status 07/22/2012 FINAL   Final   CULTURE, RESPIRATORY     Status: Normal   Collection Time   07/21/12  6:44 AM      Component Value Range Status Comment   Specimen Description TRACHEAL ASPIRATE   Final    Special Requests NONE   Final    Gram Stain     Final    Value: ABUNDANT WBC PRESENT, PREDOMINANTLY PMN     NO SQUAMOUS EPITHELIAL CELLS SEEN     RARE GRAM POSITIVE COCCI IN PAIRS   Culture NO GROWTH 2 DAYS   Final    Report Status 07/23/2012 FINAL   Final   RESPIRATORY VIRUS PANEL     Status: Abnormal  Collection Time   07/22/12  3:07 PM      Component Value Range Status Comment   Source - RVPAN TRACHEAL ASPIRATE   Final    Respiratory Syncytial Virus A NOT DETECTED   Final    Respiratory Syncytial Virus B NOT DETECTED   Final    Influenza A DETECTED (*)  Final    Influenza B NOT DETECTED   Final    Parainfluenza 1 NOT DETECTED   Final    Parainfluenza 2 NOT DETECTED   Final    Parainfluenza 3 NOT DETECTED   Final    Metapneumovirus NOT DETECTED   Final    Rhinovirus NOT DETECTED   Final    Adenovirus NOT DETECTED   Final    Influenza A H1 NOT DETECTED   Final    Influenza A H3 NOT DETECTED   Final     Assessment: Patient is a 87 yoM transferred on 1/18 from [place] to [facility] with acute respiratory failure and severe CAP. Initially started on empiric antibiotic therapy with cefepime, levofloxacin, and vancomycin.   Blood cultures at [facility] were positive for strep pneumo prior to transfer to [facility]  Blood cultures at [facility] have been negative for any microbial growth.   Respiratory gram stain at [facility] revealed GPC in pairs, but respiratory cultures are  negative for microbial growth.  Patient remains afebrile with fluctuating WBC  Antibiotic therapy will be de-escalated to Rocephin for a total 14 day length of therapy.  Goal of Therapy:  eradication of infection  Plan:  1. Start Rocephin 2g IV q24h -- end date 08/02/12 for total 14 days abx therapy per MD request. 2. Continue to monitor WBC, temperature, s/s of infection, and renal function.    Allene Dillon 07/25/2012,2:05 PM  Agree with above assessment and plan for the resident/student.  Saunders Arlington L. Amada Jupiter, PharmD, [place] Clinical Pharmacist Pager: [phone] Pharmacy: [phone] 07/25/2012 2:31 PM

## 2012-07-25 NOTE — Progress Notes (Signed)
CRITICAL VALUE ALERT  Critical value received:  Troponin 0.64  Date of notification:  07/25/12  Time of notification:  [id number]  Critical value read back:yes  Nurse who received alert: Donne Hazel, RN  MD notified (1st page): Dr Nelda Marseille  Time of first page: 41  MD notified (2nd page):  Time of second page:  Responding MD: Dr Nelda Marseille  Time MD responded: [phone]

## 2012-07-26 ENCOUNTER — Inpatient Hospital Stay (HOSPITAL_COMMUNITY): Payer: BC Managed Care – PPO

## 2012-07-26 LAB — GLUCOSE, CAPILLARY
Glucose-Capillary: 202 mg/dL — ABNORMAL HIGH (ref 70–99)
Glucose-Capillary: 148 mg/dL — ABNORMAL HIGH (ref 70–99)
Glucose-Capillary: 164 mg/dL — ABNORMAL HIGH (ref 70–99)

## 2012-07-26 LAB — BASIC METABOLIC PANEL
BUN: 166 mg/dL — ABNORMAL HIGH (ref 6–23)
Calcium: 9.2 mg/dL (ref 8.4–10.5)
Creatinine, Ser: 2.71 mg/dL — ABNORMAL HIGH (ref 0.50–1.35)
GFR calc non Af Amer: 22 mL/min — ABNORMAL LOW (ref >90)
Glucose, Bld: 117 mg/dL — ABNORMAL HIGH (ref 70–99)

## 2012-07-26 LAB — POCT I-STAT 3, ART BLOOD GAS (G3+)
Bicarbonate: 35.9 mEq/L — ABNORMAL HIGH (ref 20.0–24.0)
pH, Arterial: 7.353 (ref 7.350–7.450)
pO2, Arterial: 93 mmHg (ref 80.0–100.0)

## 2012-07-26 LAB — CBC
HCT: 33.3 % — ABNORMAL LOW (ref 39.0–52.0)
Hemoglobin: 10.7 g/dL — ABNORMAL LOW (ref 13.0–17.0)
MCH: 29.3 pg (ref 26.0–34.0)
MCHC: 32.1 g/dL (ref 30.0–36.0)
MCV: 91.2 fL (ref 78.0–100.0)

## 2012-07-26 MED ORDER — FREE WATER
250.0000 mL | Freq: Four times a day (QID) | Status: DC
  Administered 2012-07-26 – 2012-07-27 (×4): 250 mL

## 2012-07-26 MED ORDER — POTASSIUM CHLORIDE 20 MEQ/15ML (10%) PO LIQD
40.0000 meq | Freq: Three times a day (TID) | ORAL | Status: AC
  Administered 2012-07-26 (×2): 40 meq
  Filled 2012-07-26 (×2): qty 30

## 2012-07-26 MED ORDER — ACETAZOLAMIDE SODIUM 500 MG IJ SOLR
250.0000 mg | Freq: Three times a day (TID) | INTRAMUSCULAR | Status: AC
  Administered 2012-07-26 (×2): 250 mg via INTRAVENOUS
  Filled 2012-07-26 (×3): qty 500

## 2012-07-26 MED ORDER — METOLAZONE 5 MG PO TABS
5.0000 mg | ORAL_TABLET | Freq: Every day | ORAL | Status: AC
  Administered 2012-07-26: 5 mg via ORAL
  Filled 2012-07-26: qty 1

## 2012-07-26 MED ORDER — PANTOPRAZOLE SODIUM 40 MG PO PACK
40.0000 mg | PACK | Freq: Every day | ORAL | Status: DC
  Administered 2012-07-26 – 2012-08-08 (×14): 40 mg
  Filled 2012-07-26 (×16): qty 20

## 2012-07-26 MED ORDER — LEVOFLOXACIN IN D5W 750 MG/150ML IV SOLN
750.0000 mg | INTRAVENOUS | Status: DC
  Administered 2012-07-26 – 2012-07-28 (×2): 750 mg via INTRAVENOUS
  Filled 2012-07-26 (×3): qty 150

## 2012-07-26 MED ORDER — FUROSEMIDE 10 MG/ML IJ SOLN
10.0000 mg/h | INTRAVENOUS | Status: AC
  Administered 2012-07-26: 10 mg/h via INTRAVENOUS
  Filled 2012-07-26 (×4): qty 25

## 2012-07-26 NOTE — Progress Notes (Signed)
Recruitment breaths x 2 minutes.  Tolerated very well by patient with SPO2 98%.

## 2012-07-26 NOTE — Progress Notes (Addendum)
PULMONARY  / CRITICAL CARE MEDICINE  Name: [name] MRN: [id number] DOB: 18-Jul-1940    LOS: 40  REFERRING MD :  [name]  CHIEF COMPLAINT:  Respiratory failure and pneumonia  BRIEF PATIENT DESCRIPTION: 72 yoM with HTN, CHF admitted to [facility] on 1/18 on transfer from [facility] ER with sepsis and ARDS secondary to influenza A/H1N1 multifocal pneumonia.   LINES / TUBES: L. CVL 1/18 >>  ETT 1/18 >>  CULTURES: [facility] [phone]): 1/18 Blood culture x 2 >> strep pneumoniae in 2 of 2 pansenstive   [place]: 1/18 BC >> NGTD 1/18 Respiratory virus panel >>>  1/18 UC >> negative 1/18 Flu PCR >> Influenza A/ H1N1 1/18 lactate >> 2.3  1/19 PCT >> 8 >> 6.5 1/19 Resp: rare GPC in pairs. No growth at 2 days.    ANTIBIOTICS: 1/18 Vanc >> 1/23 1/18 Levaquin >> 1/23 1/18 Tamiflu >> planned end date 1/28 1/18 Cefepime >>1/23 1/23 Rocephin 1/23>>> 1/23 1/24 Levofloxacin >>>   SIGNIFICANT EVENTS:  1/18 Transfer to [facility] from [place] for respiratory failure started on Nimbex protocol for SEVERE ARDS.   LEVEL OF CARE:  ICU PRIMARY SERVICE:  PCCM CONSULTANTS:  None CODE STATUS: Full DIET:  Tube feeds DVT Px:  Heparin SQ GI Px:  Protonix  HISTORY OF PRESENT ILLNESS:  72 yo male with known hx of HTN, CHF presented to [facility] ER on 1/18 w/ 1 week hx of cough , congestion w/ progressively worsening dyspnea . Hypoxia in ER , decompensated requiring intubation . CXR w/ multilobar PNA . Hypotension unresponsive to fluid challenged requiring pressors. CCM asked to accept at [facility] ICU 1/18. Transported via [facility] . Renal failure w/ scr ~4 . Received 6L + in ER.   SUBJECTIVE / INTERVAL HISTORY:  Intubated and sedated.  Arouses to voice but does not follow commands.  Able to wean off nimbex yesterday with able to maintain his sats.  Over 4.5L of UOP yesterday so negative 2.3L overnight.    Interval History: - Remains on ARDS protocol, 70% FiO2 yesterday with PEEP of 12 which are  down from yesterday. Sating mid 90s.  - Making good urine and Cr coming down. BUN still awful...  VITAL SIGNS: Temp:  [99.5 F (37.5 C)-100.6 F (38.1 C)] 100.2 F (37.9 C) (01/24 0741) Pulse Rate:  [109-126] 113  (01/24 0800) Resp:  [9-35] 28  (01/24 0800) BP: (103-138)/(59-84) 138/84 mmHg (01/24 0800) SpO2:  [92 %-97 %] 94 % (01/24 0800) Arterial Line BP: (92-134)/(52-66) 129/57 mmHg (01/24 0800) FiO2 (%):  [70 %-80.4 %] 70 % (01/24 0800) Weight:  [241 lb 2.9 oz (109.4 kg)] 241 lb 2.9 oz (109.4 kg) (01/24 0442) HEMODYNAMICS: CVP:  [8 mmHg-16 mmHg] 11 mmHg VENTILATOR SETTINGS: Vent Mode:  [-] PRVC FiO2 (%):  [70 %-80.4 %] 70 % Set Rate:  [35 bmp] 35 bmp Vt Set:  [400 mL] 400 mL PEEP:  [12 cmH20-14 cmH20] 12 cmH20 Plateau Pressure:  [24 cmH20-27 cmH20] 26 cmH20 INTAKE / OUTPUT: Intake/Output      01/23 0701 - 01/24 0700 01/24 0701 - 01/25 0700   I.V. (mL/kg) 1247.9 (11.4) 47 (0.4)   NG/GT 960 30   IV Piggyback 50    Total Intake(mL/kg) 2257.9 (20.6) 77 (0.7)   Urine (mL/kg/hr) 4585 (1.7)    Total Output 4585    Net -2327.1 +77          PHYSICAL EXAMINATION: General: Intubated and sedated  Head: Normocephalic, atraumatic.  Lungs:  Normal respiratory  effort. Mild bronchial breath sounds bilaterally.   Heart: Tachycardic rate with regular rhythm.   Abdomen:  BS hypoactive. Soft, mildly Distended, non-tender.   Extremities: No pretibial edema.  Skin: Lacy red rash over the anterior chest and upper arms and legs. No induration.     IMAGING:  1/18 - CXR  - Bilateral pulmonary opacities are stable consistent with pneumonia. ET tube 4.2 cm above the carina.  1/19 - CT Abd - Abdominal aortic aneurysm measuring 6.4 cm AP diameter. 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 Scattered free fluid in the pelvis and pericolic gutters as well as right lower quadrant mesentery. No definite evidence of aneurysm rupture but the size of the aneurysm places the  patient at increased risk. Bilateral lower lung consolidation and small pleural effusions.  1/19 - CXR - Minimal progression of right base air space disease. Otherwise, similar left greater than right pulmonary opacities. Infection and/or alveolar edema. Small left pleural effusion.  1/20 - CXR - Stable bilateral infiltrates left greater than right.  1/22 CXR: mildly improved aeration on the right continued bilateral infiltrates.    1/23 CXR: unchanged.   1/24 CXR: Unchanged  MEDICATIONS: Infusions    . cisatracurium (NIMBEX) infusion Stopped (07/25/12 0955)  . fentaNYL infusion INTRAVENOUS 200 mcg/hr (07/26/12 0052)  . furosemide (LASIX) infusion 10 mg/hr (07/25/12 1522)  . midazolam (VERSED) infusion 4 mg/hr (07/25/12 2300)  . norepinephrine (LEVOPHED) Adult infusion Stopped (07/22/12 0830)  . phenylephrine (NEO-SYNEPHRINE) Adult infusion Stopped (07/21/12 1730)  . vasopressin (PITRESSIN) infusion - *FOR SHOCK* 0.03 Units/min (07/23/12 [id number])   Scheduled    . antiseptic oral rinse  15 mL Mouth Rinse QID  . artificial tears  1 application Both Eyes [id number]  . aspirin  81 mg Per Tube Daily  . chlorhexidine  15 mL Mouth Rinse BID  . feeding supplement (OXEPA)  1,000 mL Per Tube Q24H  . feeding supplement  60 mL Per Tube TID  . heparin  5,000 Units Subcutaneous Q8H  . hydrocortisone sod succinate (SOLU-CORTEF) injection  50 mg Intravenous Q12H  . insulin aspart  2 Units Subcutaneous Q4H  . insulin aspart  2-6 Units Subcutaneous Q4H  . insulin glargine  15 Units Subcutaneous Q24H  . levofloxacin (LEVAQUIN) IV  750 mg Intravenous Q48H  . multivitamin  5 mL Per Tube Daily  . oseltamivir  150 mg Per Tube Daily  . pantoprazole (PROTONIX) IV  40 mg Intravenous QHS   DIAGNOSES: Active Problems:  Acute respiratory failure with hypoxia  ARDS (adult respiratory distress syndrome)  Acute renal failure  ASSESSMENT / PLAN:  PULMONARY  Lab 07/26/12 0430 07/25/12 0434 07/24/12 0854    PHART 7.353 7.300* 7.325*  PCO2ART 65.2* 62.5* 56.7*  PO2ART 93.0 79.0* 60.0*  HCO3 35.9* 30.7* 29.6*  TCO2 38 33 31   A:  1) Multilobar PNA - respiratory viral panel shows influenza only.  flu PCR positive for H1N1. sputum culture from [place] showed S. Pneumo.  [facility] sputum culture shows GPC in pairs which is likely S. pneumo but there was no growth in the final culture.  2) Influenza A/ H1N1 infection: Positive flu PCR and now resp viral panel shows no other viruses.   3) Severe ARDS - continued improvement with decreasing oxygen requirements.  Nimbex off finally!  Tolerating recruitments. STill likely will need trach to full wean.  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 - Continue Tamilfu day 7 of 10  renally dosed.  - Continue per ARDS protocol - Permissive hypercapnia ok - want pH > 7.2. - Maintain low tidal volume ventilation. - Continue bronchodilators. - Will need smoking cessation education after extubation. - Continue diureses.  CARDIOVASCULAR  Lab 07/25/12 1000 07/21/12 0626 07/21/12 0026  CKTOTAL -- -- --  CKMB -- -- --  TROPONINI 0.64* 0.63* 0.50*   A:  1) Sepsis - 2/2 multilobar PNA, influenza A/ H1N1 2) Elevated troponins - question of demand ischemia vs NSTEMI in setting of sepsis vs renal failure. No new EKG changes. Recheck trop stable with no rise.  Cr 2.8 so likely will hang around longer.  3) AAA - 6.4 cm per CT Abd 1/19.  Will continue to monitor unless developing hypotension or hemodynamic compromise. 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  P:  - Monitor AAA for now, low threshold for vascular consult if worsening hemodynamically will monitor for now.  Will need repair when better given size of AAA.  - Continue aspirin 81mg  daily. - Spoke with cards, not eliminating trop due to renal failure.  RENAL  Lab 07/26/12 0415 07/25/12 0440 07/24/12 0427  NA 147* 142 136  K 4.0 4.3 3.7  CL 105 101 95*  CO2 33* 30 28   BUN 166* 143* 131*  CREATININE 2.71* 2.80* 3.38*  GLUCOSE 117* 187* 223*   A:  1) Acute renal failure  - likely 2/2 volume depletion in setting of sepsis, possible ATN in setting of hypotension, mildly improved over the last few days.  CVP stable 11-13.  UOP brisk with lasix drip again overnight.  Creatinine trending downward well but BUN still very high.  Likely ATN now in the polyuric phase. Consider change to IV bolus lasix today 2) Hypokalemia - repleted yesterday 3) Hyponatremia - relative, corrects to BG.   P:  - Continue to monitor. - Renally dose medications. - Continue tube feeds. - K supplementation.  - Lasix drip.  GASTROINTESTINAL  Lab 07/23/12 0440 07/22/12 0417 07/20/12 2010  AST 37 34 36  ALT 33 30 42  ALKPHOS 155* 75 64  BILITOT 0.5 0.6 0.8  PROT 6.3 5.5* 6.0  ALBUMIN 1.6* 1.5* 1.9*   A: No acute issues. P:  - Continue tube feeds.  HEMATOLOGIC  Lab 07/26/12 0415 07/25/12 0440 07/24/12 0427 07/20/12 2010  WBC 13.7* 12.5* 13.7* --  HGB 10.7* 11.6* 11.1* --  HCT 33.3* 35.1* 32.7* --  PLT 146* 175 184 --  APTT -- -- -- 35  INR -- -- -- 1.34   A:  1) Acute normocytic anemia - likely related to critical illness, unknown baseline. No obvious source of bleed. 2) Thrombocytopenia - unknown baseline, low normal in 05/2008. No obvious source of bleed. Likely related to critical illness. P:  - Continue to monitor CBC.  INFECTIOUS  Lab 07/26/12 0415 07/25/12 0440 07/24/12 0427 07/23/12 0440 07/22/12 0417 07/21/12 1029 07/20/12 2010 07/20/12 1900  WBC 13.7* 12.5* 13.7* -- -- -- -- --  NEUTROABS -- -- 12.7* 23.8* -- -- 9.8* --  LATICACIDVEN -- -- -- -- -- -- -- 1.6  PROCALCITON -- -- -- 4.39 6.50 8.24 -- --   A:  1) Sepsis secondary to multilobar PNA, possible influenza 2) S. Pneumonia bacteremia - 2/2 blood cultures positive with S. Pneumo, pan sensitive from [place].  BC here clear so far.  Resp culture from [facility] showing Deepstep in pairs but no growth on final  culture. Plan for  narrowing of therapy to ceftriaxone for 14 days.  Today (1/24) is day 7 of antibiotics.  3. Drug rash: likely secondary to Cephalosporins.  Changed to Levaquin today for the rest of the course.  4. Leukocytosis - improving, continues to be afebrile.    P:  - Cortisol of 33, Continue to taper steroids down.  - Change Rocephin to Levaquin given sensitivities from blood culture from [facility] 7/14 days.  - Continue Tamiflu day 7/10.   ENDOCRINE / RHEUMATOLOGIC  Lab 07/26/12 0740 07/26/12 0412 07/26/12 0023 07/25/12 2028 07/25/12 1605  GLUCAP 126* 98 115* 127* 161*   A: 1) Prediabetes - blood sugars stable overnight. P:  - ICU hyperglycemia protocol. - Lantus 15 units.  Will monitor with taper of steroids.   NEUROLOGIC / PSYCHIATRIC RAAS Goal: -3 CAM-ICU: Cannot assess     A: 1) Acute encephalopathy - in setting of sepsis.  P:  - Continue with RAAS goal of -3. - Nimbex - goal vent synchrony   CLINICAL SUMMARY: 2 yoM with HTN, CHF admitted to [facility] MICU on transfer from [facility] on 07/20/2012 secondary to sepsis in setting  of influenza A/ H1N1 / multilobar PNA. Intubated in ED secondary to hypoxic respiratory failure. Has ARDS on the protocol nimbex 1/19 to 1/24.  Started empirically on broad spectrum ABx on admission and narrowed to Levaquin for 14 days of total treatment.   SignedTrish Fountain, MD Internal Medicine Resident, PGY III Co-Chief Resident, Internal Medicine Pager: [phone] 07/26/2012 10:09 AM   Family updated bedside.  Slow improvement, needs more diureses.  CC time 35 min.  Patient seen and examined, agree with above note.  I dictated the care and orders written for this patient under my direction.

## 2012-07-26 NOTE — Progress Notes (Signed)
Recruitment breaths x 2 minutes. ((PC 35, RR 10, FIO2 100%, and PEEP 5)This was well tolerated by patinet, with SPO2 99%.

## 2012-07-26 NOTE — Progress Notes (Signed)
ANTIBIOTIC CONSULT NOTE - INITIAL  Pharmacy Consult for Levaquin Indication: S. pneumo bacteremia  Allergies  Allergen Reactions  . Cephalosporins Rash    Patient Measurements: Height: 5\' 7"  (170.2 cm) Weight: 241 lb 2.9 oz (109.4 kg) IBW/kg (Calculated) : 66.1   Vital Signs: Temp: 100.2 F (37.9 C) (01/24 0741) Temp src: Oral (01/24 0741) BP: 138/84 mmHg (01/24 0800) Pulse Rate: 113  (01/24 0800) Intake/Output from previous day: 01/23 0701 - 01/24 0700 In: 2257.9 [I.V.:1247.9; NG/GT:960; IV Piggyback:50] Out: 4585 [Urine:4585] Intake/Output from this shift: Total I/O In: 77 [I.V.:47; NG/GT:30] Out: -   Labs:  Basename 07/26/12 0415 07/25/12 0440 07/24/12 0427  WBC 13.7* 12.5* 13.7*  HGB 10.7* 11.6* 11.1*  PLT 146* 175 184  LABCREA -- -- --  CREATININE 2.71* 2.80* 3.38*   Estimated Creatinine Clearance: 29.9 ml/min (by C-G formula based on Cr of 2.71). No results found for this basename: VANCOTROUGH:2,VANCOPEAK:2,VANCORANDOM:2,GENTTROUGH:2,GENTPEAK:2,GENTRANDOM:2,TOBRATROUGH:2,TOBRAPEAK:2,TOBRARND:2,AMIKACINPEAK:2,AMIKACINTROU:2,AMIKACIN:2, in the last 72 hours   Microbiology: Recent Results (from the past 720 hour(s))  CULTURE, BLOOD (ROUTINE X 2)     Status: Normal (Preliminary result)   Collection Time   07/20/12  7:15 PM      Component Value Range Status Comment   Specimen Description BLOOD LEFT HAND   Final    Special Requests BOTTLES DRAWN AEROBIC AND ANAEROBIC 10CC   Final    Culture  Setup Time 07/21/2012 02:14   Final    Culture     Final    Value:        BLOOD CULTURE RECEIVED NO GROWTH TO DATE CULTURE WILL BE HELD FOR 5 DAYS BEFORE ISSUING A FINAL NEGATIVE REPORT   Report Status PENDING   Incomplete   CULTURE, BLOOD (ROUTINE X 2)     Status: Normal (Preliminary result)   Collection Time   07/20/12  7:30 PM      Component Value Range Status Comment   Specimen Description BLOOD LEFT HAND   Final    Special Requests BOTTLES DRAWN AEROBIC AND ANAEROBIC  10CC   Final    Culture  Setup Time 07/21/2012 02:14   Final    Culture     Final    Value:        BLOOD CULTURE RECEIVED NO GROWTH TO DATE CULTURE WILL BE HELD FOR 5 DAYS BEFORE ISSUING A FINAL NEGATIVE REPORT   Report Status PENDING   Incomplete   URINE CULTURE     Status: Normal   Collection Time   07/20/12  9:00 PM      Component Value Range Status Comment   Specimen Description URINE, CATHETERIZED   Final    Special Requests NONE   Final    Culture  Setup Time 07/21/2012 02:56   Final    Colony Count NO GROWTH   Final    Culture NO GROWTH   Final    Report Status 07/22/2012 FINAL   Final   CULTURE, RESPIRATORY     Status: Normal   Collection Time   07/21/12  6:44 AM      Component Value Range Status Comment   Specimen Description TRACHEAL ASPIRATE   Final    Special Requests NONE   Final    Gram Stain     Final    Value: ABUNDANT WBC PRESENT, PREDOMINANTLY PMN     NO SQUAMOUS EPITHELIAL CELLS SEEN     RARE GRAM POSITIVE COCCI IN PAIRS   Culture NO GROWTH 2 DAYS   Final  Report Status 07/23/2012 FINAL   Final   RESPIRATORY VIRUS PANEL     Status: Abnormal   Collection Time   07/22/12  3:07 PM      Component Value Range Status Comment   Source - RVPAN TRACHEAL ASPIRATE   Final    Respiratory Syncytial Virus A NOT DETECTED   Final    Respiratory Syncytial Virus B NOT DETECTED   Final    Influenza A DETECTED (*)  Final    Influenza B NOT DETECTED   Final    Parainfluenza 1 NOT DETECTED   Final    Parainfluenza 2 NOT DETECTED   Final    Parainfluenza 3 NOT DETECTED   Final    Metapneumovirus NOT DETECTED   Final    Rhinovirus NOT DETECTED   Final    Adenovirus NOT DETECTED   Final    Influenza A H1 NOT DETECTED   Final    Influenza A H3 NOT DETECTED   Final     Medical History: Past Medical History  Diagnosis Date  . Hypertension   . Hyperlipidemia   . OSA (obstructive sleep apnea)     on cpap  . Prediabetes    Assessment: 72 y.o. M transferred on 1/18 from  [place] to [facility] with acute respiratory failure and severe CAP. Initially started on empiric antibiotic therapy with cefepime, levofloxacin, and vancomycin.   Blood cultures at [facility] were positive for strep pneumo prior to transfer to [facility]. Blood cultures at [facility] have been negative for any microbial growth. Respiratory gram stain at [facility] revealed GPC in pairs, but respiratory cultures are negative for microbial growth. The patient is positive for influenza and is on treatment with tamiflu. Patient remains afebrile with fluctuating WBC   The patient continues on Tamiflu for influenza however was de-escalated to Rocephin monotherapy for continued treatment of r/o sepsis on 1/23. On 1/24 a.m, it was noted that the patient had a rash covering his back, palms of hands, soles of feet, and a few patches in between. The patient was previously on Cefepime from 1/18 to 1/23, so total is D#7 of empiric cephalosporins. The typical onset of rash for a cephalosporin allergy is 7-10 days. No "new' medications have been added that could have contributed to the rash therefore it was deemed that the cephalosporin as the likely culprit. Rocephin has been d/ced and pharmacy has now been consulted to start Levaquin for the rest of the  treatment duration.  Goal of Therapy:  Proper antibiotics for infection/cultures adjusted for renal/hepatic function   Plan:  1. Levaquin 750 mg IV every 48 hours 2. Will continue to follow renal function, culture results, LOT, and antibiotic de-escalation plans   Alycia Rossetti, PharmD, BCPS Clinical Pharmacist Pager: [phone] 07/26/2012 9:37 AM

## 2012-07-27 ENCOUNTER — Inpatient Hospital Stay (HOSPITAL_COMMUNITY): Payer: BC Managed Care – PPO

## 2012-07-27 LAB — GLUCOSE, CAPILLARY
Glucose-Capillary: 195 mg/dL — ABNORMAL HIGH (ref 70–99)
Glucose-Capillary: 219 mg/dL — ABNORMAL HIGH (ref 70–99)
Glucose-Capillary: 223 mg/dL — ABNORMAL HIGH (ref 70–99)
Glucose-Capillary: 244 mg/dL — ABNORMAL HIGH (ref 70–99)

## 2012-07-27 LAB — BASIC METABOLIC PANEL
BUN: 181 mg/dL — ABNORMAL HIGH (ref 6–23)
CO2: 41 mEq/L (ref 19–32)
Calcium: 9.6 mg/dL (ref 8.4–10.5)
Chloride: 106 mEq/L (ref 96–112)
GFR calc Af Amer: 29 mL/min — ABNORMAL LOW (ref >90)
GFR calc non Af Amer: 25 mL/min — ABNORMAL LOW (ref >90)
Glucose, Bld: 278 mg/dL — ABNORMAL HIGH (ref 70–99)
Potassium: 3.4 mEq/L — ABNORMAL LOW (ref 3.5–5.1)
Sodium: 154 mEq/L — ABNORMAL HIGH (ref 135–145)
Sodium: 155 mEq/L — ABNORMAL HIGH (ref 135–145)

## 2012-07-27 LAB — BLOOD GAS, ARTERIAL
Bicarbonate: 38 mEq/L — ABNORMAL HIGH (ref 20.0–24.0)
PEEP: 10 cmH2O
TCO2: 40 mmol/L (ref 0–100)
pH, Arterial: 7.389 (ref 7.350–7.450)
pO2, Arterial: 74.2 mmHg — ABNORMAL LOW (ref 80.0–100.0)

## 2012-07-27 LAB — CBC
HCT: 33 % — ABNORMAL LOW (ref 39.0–52.0)
Hemoglobin: 10.7 g/dL — ABNORMAL LOW (ref 13.0–17.0)
MCHC: 32.4 g/dL (ref 30.0–36.0)
RBC: 3.59 MIL/uL — ABNORMAL LOW (ref 4.22–5.81)
WBC: 14.3 10*3/uL — ABNORMAL HIGH (ref 4.0–10.5)

## 2012-07-27 LAB — CULTURE, BLOOD (ROUTINE X 2)

## 2012-07-27 LAB — PHOSPHORUS: Phosphorus: 7.2 mg/dL — ABNORMAL HIGH (ref 2.3–4.6)

## 2012-07-27 LAB — MAGNESIUM: Magnesium: 2.3 mg/dL (ref 1.5–2.5)

## 2012-07-27 MED ORDER — ACETAMINOPHEN 160 MG/5ML PO SOLN
650.0000 mg | ORAL | Status: DC | PRN
  Administered 2012-07-27 – 2012-08-10 (×13): 650 mg
  Filled 2012-07-27 (×13): qty 20.3

## 2012-07-27 MED ORDER — POTASSIUM CHLORIDE 20 MEQ/15ML (10%) PO LIQD
ORAL | Status: AC
  Filled 2012-07-27: qty 30

## 2012-07-27 MED ORDER — POTASSIUM CHLORIDE 20 MEQ/15ML (10%) PO LIQD
40.0000 meq | Freq: Once | ORAL | Status: AC
  Administered 2012-07-27: 40 meq

## 2012-07-27 MED ORDER — DEXTROSE 5 % IV SOLN
INTRAVENOUS | Status: DC
  Administered 2012-07-27: 05:00:00 via INTRAVENOUS
  Administered 2012-07-30: 1000 mL via INTRAVENOUS
  Administered 2012-07-30: 09:00:00 via INTRAVENOUS
  Administered 2012-07-31 (×2): 500 mL via INTRAVENOUS
  Administered 2012-07-31: 08:00:00 via INTRAVENOUS
  Administered 2012-07-31: 1000 mL via INTRAVENOUS
  Administered 2012-08-01: 09:00:00 via INTRAVENOUS

## 2012-07-27 MED ORDER — ACETAZOLAMIDE SODIUM 500 MG IJ SOLR
250.0000 mg | Freq: Four times a day (QID) | INTRAMUSCULAR | Status: DC
  Administered 2012-07-27 (×2): 250 mg via INTRAVENOUS
  Filled 2012-07-27 (×3): qty 500

## 2012-07-27 MED ORDER — FREE WATER
300.0000 mL | Status: DC
  Administered 2012-07-27 – 2012-07-29 (×12): 300 mL

## 2012-07-27 MED ORDER — POTASSIUM CHLORIDE 20 MEQ/15ML (10%) PO LIQD
40.0000 meq | Freq: Three times a day (TID) | ORAL | Status: AC
  Administered 2012-07-27 (×3): 40 meq
  Filled 2012-07-27 (×3): qty 30

## 2012-07-27 MED ORDER — VANCOMYCIN HCL 10 G IV SOLR
1250.0000 mg | INTRAVENOUS | Status: AC
  Administered 2012-07-27: 1250 mg via INTRAVENOUS
  Filled 2012-07-27: qty 1250

## 2012-07-27 MED ORDER — FUROSEMIDE 10 MG/ML IJ SOLN
10.0000 mg/h | INTRAVENOUS | Status: DC
  Administered 2012-07-27: 10 mg/h via INTRAVENOUS
  Filled 2012-07-27 (×2): qty 25

## 2012-07-27 MED ORDER — VANCOMYCIN HCL IN DEXTROSE 1-5 GM/200ML-% IV SOLN
1000.0000 mg | INTRAVENOUS | Status: DC
  Administered 2012-07-28 – 2012-07-29 (×2): 1000 mg via INTRAVENOUS
  Filled 2012-07-27 (×4): qty 200

## 2012-07-27 MED ORDER — METOLAZONE 5 MG PO TABS
5.0000 mg | ORAL_TABLET | Freq: Every day | ORAL | Status: AC
  Administered 2012-07-27: 5 mg via ORAL
  Filled 2012-07-27: qty 1

## 2012-07-27 MED ORDER — HYDROCORTISONE SOD SUCCINATE 100 MG IJ SOLR
25.0000 mg | Freq: Two times a day (BID) | INTRAMUSCULAR | Status: DC
  Administered 2012-07-27 – 2012-07-30 (×6): 25 mg via INTRAVENOUS
  Filled 2012-07-27 (×8): qty 0.5

## 2012-07-27 NOTE — Progress Notes (Signed)
CRITICAL VALUE ALERT  Critical value received:  CO2 41  Date of notification: 07/27/12  Time of notification:  1748  Critical value read back:yes  Nurse who received alert:  Charlott Holler, RN  MD notified (1st page):  Dr. Elsworth Soho  Time of first page:  1753  MD notified (2nd page):  Time of second page:  Responding MD:  Dr. Elsworth Soho  Time MD responded:  [phone]

## 2012-07-27 NOTE — Progress Notes (Signed)
ANTIBIOTIC CONSULT NOTE - INITIAL  Pharmacy Consult for Vancomycin Indication:  Broadened coverage in setting of fever spikes  Allergies  Allergen Reactions  . Cephalosporins Rash    Patient Measurements: Height: 5\' 7"  (170.2 cm) Weight: 230 lb 2.6 oz (104.4 kg) IBW/kg (Calculated) : 66.1   Vital Signs: Temp: 102.8 F (39.3 C) (01/25 2000) Temp src: Oral (01/25 2000) BP: 109/58 mmHg (01/25 2013) Pulse Rate: 127  (01/25 2013) Intake/Output from previous day: 01/24 0701 - 01/25 0700 In: 1773.5 [I.V.:1173.5; NG/GT:450; IV Piggyback:150] Out: 6402 [Urine:6400; Stool:2] Intake/Output from this shift: Total I/O In: 81 [I.V.:51; NG/GT:30] Out: -   Labs:  Basename 07/27/12 1658 07/27/12 0334 07/26/12 0415 07/25/12 0440  WBC -- 14.3* 13.7* 12.5*  HGB -- 10.7* 10.7* 11.6*  PLT -- 163 146* 175  LABCREA -- -- -- --  CREATININE 2.71* 2.49* 2.71* --   Estimated Creatinine Clearance: 29.2 ml/min (by C-G formula based on Cr of 2.71). No results found for this basename: VANCOTROUGH:2,VANCOPEAK:2,VANCORANDOM:2,GENTTROUGH:2,GENTPEAK:2,GENTRANDOM:2,TOBRATROUGH:2,TOBRAPEAK:2,TOBRARND:2,AMIKACINPEAK:2,AMIKACINTROU:2,AMIKACIN:2, in the last 72 hours   Microbiology: Recent Results (from the past 720 hour(s))  CULTURE, BLOOD (ROUTINE X 2)     Status: Normal   Collection Time   07/20/12  7:15 PM      Component Value Range Status Comment   Specimen Description BLOOD LEFT HAND   Final    Special Requests BOTTLES DRAWN AEROBIC AND ANAEROBIC 10CC   Final    Culture  Setup Time 07/21/2012 02:14   Final    Culture NO GROWTH 5 DAYS   Final    Report Status 07/27/2012 FINAL   Final   CULTURE, BLOOD (ROUTINE X 2)     Status: Normal   Collection Time   07/20/12  7:30 PM      Component Value Range Status Comment   Specimen Description BLOOD LEFT HAND   Final    Special Requests BOTTLES DRAWN AEROBIC AND ANAEROBIC 10CC   Final    Culture  Setup Time 07/21/2012 02:14   Final    Culture NO GROWTH 5  DAYS   Final    Report Status 07/27/2012 FINAL   Final   URINE CULTURE     Status: Normal   Collection Time   07/20/12  9:00 PM      Component Value Range Status Comment   Specimen Description URINE, CATHETERIZED   Final    Special Requests NONE   Final    Culture  Setup Time 07/21/2012 02:56   Final    Colony Count NO GROWTH   Final    Culture NO GROWTH   Final    Report Status 07/22/2012 FINAL   Final   CULTURE, RESPIRATORY     Status: Normal   Collection Time   07/21/12  6:44 AM      Component Value Range Status Comment   Specimen Description TRACHEAL ASPIRATE   Final    Special Requests NONE   Final    Gram Stain     Final    Value: ABUNDANT WBC PRESENT, PREDOMINANTLY PMN     NO SQUAMOUS EPITHELIAL CELLS SEEN     RARE GRAM POSITIVE COCCI IN PAIRS   Culture NO GROWTH 2 DAYS   Final    Report Status 07/23/2012 FINAL   Final   RESPIRATORY VIRUS PANEL     Status: Abnormal   Collection Time   07/22/12  3:07 PM      Component Value Range Status Comment   Source - RVPAN TRACHEAL ASPIRATE  Final    Respiratory Syncytial Virus A NOT DETECTED   Final    Respiratory Syncytial Virus B NOT DETECTED   Final    Influenza A DETECTED (*)  Final    Influenza B NOT DETECTED   Final    Parainfluenza 1 NOT DETECTED   Final    Parainfluenza 2 NOT DETECTED   Final    Parainfluenza 3 NOT DETECTED   Final    Metapneumovirus NOT DETECTED   Final    Rhinovirus NOT DETECTED   Final    Adenovirus NOT DETECTED   Final    Influenza A H1 NOT DETECTED   Final    Influenza A H3 NOT DETECTED   Final     Medical History: Past Medical History  Diagnosis Date  . Hypertension   . Hyperlipidemia   . OSA (obstructive sleep apnea)     on cpap  . Prediabetes     Assessment: 72 y.o. M currently on Levaquin for S.pneumo bacteremia and Tamiflu for positive influenza -- to start Vancomycin for broadened coverage in the setting of a fever spikes this evening.   The patient was previously on Vancomycin this  admission -- with a last dose charted of Vancomycin 1250 mg at 1215 on 1/22. Vanc levels were 18.5 (1/19@1200 ), 11.9 (1/20@0420 ). The patient is noted to have ARI with a SCr 2.71 << 2.49, CrCl~20-25 ml/min, UOP/24h: 2.6 ml/kg/hr (on diuretics). Will assume most of the Vancomycin to have been eliminated and will give a small re-load this evening.  Goal of Therapy:  Vancomycin trough level 15-20 mcg/ml  Plan:  1. Vancomycin 1250 mg IV x 1 dose 2. Vancomycin 1g IV every 24 hours 3. Will continue to follow renal function, culture results, LOT, and antibiotic de-escalation plans  Alycia Rossetti, PharmD, BCPS Clinical Pharmacist Pager: [phone] 07/27/2012 9:42 PM

## 2012-07-27 NOTE — Progress Notes (Signed)
PULMONARY  / CRITICAL CARE MEDICINE  Name: [name] MRN: [id number] DOB: 01/20/1941    LOS: 77  REFERRING MD :  [name]  CHIEF COMPLAINT:  Respiratory failure and pneumonia  BRIEF PATIENT DESCRIPTION: 72 yoM with HTN, CHF admitted to [facility] on 1/18 on transfer from [facility] ER with sepsis and ARDS secondary to influenza A/H1N1 multifocal pneumonia.   LINES / TUBES: L. CVL 1/18 >>  ETT 1/18 >>  CULTURES: [facility] [phone]): 1/18 Blood culture x 2 >> strep pneumoniae in 2 of 2 pansenstive   [place]: 1/18 BC >> NGTD 1/18 Respiratory virus panel >>>  1/18 UC >> negative 1/18 Flu PCR >> Influenza A/ H1N1 1/18 lactate >> 2.3  1/19 PCT >> 8 >> 6.5 1/19 Resp: rare GPC in pairs. No growth at 2 days.    ANTIBIOTICS: 1/18 Vanc >> 1/23 1/18 Levaquin >> 1/23 1/18 Tamiflu >> planned end date 1/28 1/18 Cefepime >>1/23 1/23 Rocephin 1/23>>> 1/23 1/24 Levofloxacin >>>   SIGNIFICANT EVENTS:  1/18 Transfer to [facility] from [place] for respiratory failure started on Nimbex protocol for SEVERE ARDS.   LEVEL OF CARE:  ICU PRIMARY SERVICE:  PCCM CONSULTANTS:  None CODE STATUS: Full DIET:  Tube feeds DVT Px:  Heparin SQ GI Px:  Protonix  HISTORY OF PRESENT ILLNESS:  72 yo male with known hx of HTN, CHF presented to [facility] ER on 1/18 w/ 1 week hx of cough , congestion w/ progressively worsening dyspnea . Hypoxia in ER , decompensated requiring intubation . CXR w/ multilobar PNA . Hypotension unresponsive to fluid challenged requiring pressors. CCM asked to accept at [facility] ICU 1/18. Transported via [facility] . Renal failure w/ scr ~4 . Received 6L + in ER.   SUBJECTIVE / INTERVAL HISTORY:  Intubated and sedated.  Arouses to voice but does not follow commands.  Able to wean off nimbex yesterday with able to maintain his sats.  Over 4.5L of UOP yesterday so negative 2.3L overnight.    Interval History: - Remains on ARDS protocol, 70% FiO2 yesterday with PEEP of 12 which are  down from yesterday. Sating mid 90s.  - Making good urine and Cr coming down. BUN still awful...  VITAL SIGNS: Temp:  [98.4 F (36.9 C)-101.2 F (38.4 C)] 98.5 F (36.9 C) (01/25 0800) Pulse Rate:  [111-134] 124  (01/25 0900) Resp:  [9-39] 35  (01/25 0900) BP: (103-157)/(61-89) 138/86 mmHg (01/25 0900) SpO2:  [89 %-97 %] 93 % (01/25 0900) Arterial Line BP: (104-162)/(59-79) 137/62 mmHg (01/25 0900) FiO2 (%):  [50 %-70 %] 59.7 % (01/25 0900) Weight:  [104.4 kg (230 lb 2.6 oz)] 104.4 kg (230 lb 2.6 oz) (01/25 0400) HEMODYNAMICS: CVP:  [11 mmHg-14 mmHg] 14 mmHg VENTILATOR SETTINGS: Vent Mode:  [-] PRVC FiO2 (%):  [50 %-70 %] 59.7 % Set Rate:  [35 bmp] 35 bmp Vt Set:  [400 mL] 400 mL PEEP:  [10 cmH20] 10 cmH20 Plateau Pressure:  [18 cmH20-24 cmH20] 18 cmH20 INTAKE / OUTPUT: Intake/Output      01/24 0701 - 01/25 0700 01/25 0701 - 01/26 0700   I.V. (mL/kg) 1173.5 (11.2) 63 (0.6)   NG/GT 450 60   IV Piggyback 150    Total Intake(mL/kg) 1773.5 (17) 123 (1.2)   Urine (mL/kg/hr) 6400 (2.6) 300   Stool 2    Total Output 6402 300   Net -4628.5 -177          PHYSICAL EXAMINATION: General: Intubated and sedated  Head: Normocephalic, atraumatic.  Lungs:  Normal respiratory effort. Mild bronchial breath sounds bilaterally.   Heart: Tachycardic rate with regular rhythm.   Abdomen:  BS hypoactive. Soft, mildly Distended, non-tender.   Extremities: No pretibial edema.  Skin: Lacy red rash over the anterior chest and upper arms and legs. No induration.     IMAGING:  1/18 - CXR  - Bilateral pulmonary opacities are stable consistent with pneumonia. ET tube 4.2 cm above the carina.  1/19 - CT Abd - Abdominal aortic aneurysm measuring 6.4 cm AP diameter. 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 Scattered free fluid in the pelvis and pericolic gutters as well as right lower quadrant mesentery. No definite evidence of aneurysm rupture but the size of the aneurysm places  the patient at increased risk. Bilateral lower lung consolidation and small pleural effusions.  1/19 - CXR - Minimal progression of right base air space disease. Otherwise, similar left greater than right pulmonary opacities. Infection and/or alveolar edema. Small left pleural effusion.  1/20 - CXR - Stable bilateral infiltrates left greater than right.  1/22 CXR: mildly improved aeration on the right continued bilateral infiltrates.    1/23 CXR: unchanged.   1/24 CXR: Unchanged  MEDICATIONS: Infusions    . dextrose 10 mL/hr at 07/27/12 0515  . fentaNYL infusion INTRAVENOUS 100 mcg/hr (07/27/12 0800)  . furosemide (LASIX) infusion 10 mg/hr (07/26/12 1823)  . midazolam (VERSED) infusion 2 mg/hr (07/26/12 0730)   Scheduled    . antiseptic oral rinse  15 mL Mouth Rinse QID  . artificial tears  1 application Both Eyes [id number]  . aspirin  81 mg Per Tube Daily  . chlorhexidine  15 mL Mouth Rinse BID  . feeding supplement (OXEPA)  1,000 mL Per Tube Q24H  . feeding supplement  60 mL Per Tube TID  . free water  250 mL Per Tube Q6H  . heparin  5,000 Units Subcutaneous Q8H  . hydrocortisone sod succinate (SOLU-CORTEF) injection  50 mg Intravenous Q12H  . insulin aspart  2 Units Subcutaneous Q4H  . insulin aspart  2-6 Units Subcutaneous Q4H  . insulin glargine  15 Units Subcutaneous Q24H  . levofloxacin (LEVAQUIN) IV  750 mg Intravenous Q48H  . multivitamin  5 mL Per Tube Daily  . oseltamivir  150 mg Per Tube Daily  . pantoprazole sodium  40 mg Per Tube Daily  . potassium chloride       DIAGNOSES: Active Problems:  Acute respiratory failure with hypoxia  ARDS (adult respiratory distress syndrome)  Acute renal failure  ASSESSMENT / PLAN:  PULMONARY  Lab 07/27/12 0345 07/26/12 0430 07/25/12 0434  PHART 7.389 7.353 7.300*  PCO2ART 64.4* 65.2* 62.5*  PO2ART 74.2* 93.0 79.0*  HCO3 38.0* 35.9* 30.7*  TCO2 40.0 38 33   A:  1) Multilobar PNA - respiratory viral panel shows  influenza only.  flu PCR positive for H1N1. sputum culture from [place] showed S. Pneumo.  [facility] sputum culture shows GPC in pairs which is likely S. pneumo but there was no growth in the final culture.  2) Influenza A/ H1N1 infection: Positive flu PCR and now resp viral panel shows no other viruses.   3) Severe ARDS - continued improvement with decreasing oxygen requirements.  Nimbex off finally!  Tolerating recruitments. STill likely will need trach to full wean.  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 - Continue Tamilfu day 8 of 10 renally dosed.  - Continue per ARDS protocol -  Permissive hypercapnia ok - want pH > 7.2. - Maintain low tidal volume ventilation. - Continue bronchodilators. - Will need smoking cessation education after extubation. - Continue diureses. - Drop FiO2 to 50%.  CARDIOVASCULAR  Lab 07/25/12 1000 07/21/12 0626 07/21/12 0026  CKTOTAL -- -- --  CKMB -- -- --  TROPONINI 0.64* 0.63* 0.50*   A:  1) Sepsis - 2/2 multilobar PNA, influenza A/ H1N1 2) Elevated troponins - question of demand ischemia vs NSTEMI in setting of sepsis vs renal failure. No new EKG changes. Recheck trop stable with no rise.  Cr 2.8 so likely will hang around longer.  3) AAA - 6.4 cm per CT Abd 1/19.  Will continue to monitor unless developing hypotension or hemodynamic compromise. 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  P:  - Monitor AAA for now, low threshold for vascular consult if worsening hemodynamically will monitor for now.  Will need repair when better given size of AAA.  - Continue aspirin 81mg  daily. - Spoke with cards, not eliminating trop due to renal failure.  RENAL  Lab 07/27/12 0334 07/26/12 0415 07/25/12 0440  NA 154* 147* 142  K 3.4* 4.0 4.3  CL 106 105 101  CO2 37* 33* 30  BUN 181* 166* 143*  CREATININE 2.49* 2.71* 2.80*  GLUCOSE 172* 117* 187*   A:  1) Acute renal failure  - likely 2/2 volume depletion in setting of sepsis,  possible ATN in setting of hypotension, mildly improved over the last few days.  CVP stable 11-13.  UOP brisk with lasix drip again overnight.  Creatinine trending downward well but BUN still very high.  Likely ATN now in the polyuric phase. Consider change to IV bolus lasix today 2) Hypokalemia - repleted yesterday 3) Hyponatremia - relative, corrects to BG.   P:  - Continue to monitor. - Renally dose medications. - Continue tube feeds. - K supplementation.  - Lasix drip x24 hours then change to pushes.  GASTROINTESTINAL  Lab 07/23/12 0440 07/22/12 0417 07/20/12 2010  AST 37 34 36  ALT 33 30 42  ALKPHOS 155* 75 64  BILITOT 0.5 0.6 0.8  PROT 6.3 5.5* 6.0  ALBUMIN 1.6* 1.5* 1.9*   A: No acute issues. P:  - Continue tube feeds.  HEMATOLOGIC  Lab 07/27/12 0334 07/26/12 0415 07/25/12 0440 07/20/12 2010  WBC 14.3* 13.7* 12.5* --  HGB 10.7* 10.7* 11.6* --  HCT 33.0* 33.3* 35.1* --  PLT 163 146* 175 --  APTT -- -- -- 35  INR -- -- -- 1.34   A:  1) Acute normocytic anemia - likely related to critical illness, unknown baseline. No obvious source of bleed. 2) Thrombocytopenia - unknown baseline, low normal in 05/2008. No obvious source of bleed. Likely related to critical illness. P:  - Continue to monitor CBC.  INFECTIOUS  Lab 07/27/12 0334 07/26/12 0415 07/25/12 0440 07/24/12 0427 07/23/12 0440 07/22/12 0417 07/21/12 1029 07/20/12 2010 07/20/12 1900  WBC 14.3* 13.7* 12.5* -- -- -- -- -- --  NEUTROABS -- -- -- 12.7* 23.8* -- -- 9.8* --  LATICACIDVEN -- -- -- -- -- -- -- -- 1.6  PROCALCITON -- -- -- -- 4.39 6.50 8.24 -- --   A:  1) Sepsis secondary to multilobar PNA, possible influenza 2) S. Pneumonia bacteremia - 2/2 blood cultures positive with S. Pneumo, pan sensitive from [place].  BC here clear so far.  Resp culture from [facility] showing [place] in pairs but  no growth on final culture. Plan for narrowing of therapy to ceftriaxone for 14 days.  Today (1/24) is day 7 of  antibiotics.  3. Drug rash: likely secondary to Cephalosporins.  Changed to Levaquin today for the rest of the course.  4. Leukocytosis - improving, continues to be afebrile.    P:  - Cortisol of 33, Continue to taper steroids down.  - Changed Rocephin to Levaquin given sensitivities from blood culture from [facility] 8/14 days.  - Continue Tamiflu day 8/10.   ENDOCRINE / RHEUMATOLOGIC  Lab 07/27/12 0744 07/27/12 0349 07/27/12 0016 07/26/12 2010 07/26/12 1607  GLUCAP 179* 151* 195* 164* 148*   A: 1) Prediabetes - blood sugars stable overnight. P:  - ICU hyperglycemia protocol. - Lantus 15 units.  Will monitor with taper of steroids, down to 25 q12.   NEUROLOGIC / PSYCHIATRIC RAAS Goal: -3 CAM-ICU: Cannot assess     A: 1) Acute encephalopathy - in setting of sepsis.  P:  - Continue with RAAS goal of -3. - Nimbex - D/Ced  CLINICAL SUMMARY: 89 yoM with HTN, CHF admitted to [facility] MICU on transfer from [facility] on 07/20/2012 secondary to sepsis in setting  of influenza A/ H1N1 / multilobar PNA. Intubated in ED secondary to hypoxic respiratory failure. Has ARDS on the protocol nimbex 1/19 to 1/24.  Started empirically on broad spectrum ABx on admission and narrowed to Levaquin for 14 days of total treatment.   Family updated bedside.  Slow improvement, needs more diureses.  CC time 35 min.  Rush Farmer, M.D. [facility] Pulmonary/Critical Care Medicine. Pager: [phone]. After hours pager: [phone].

## 2012-07-27 NOTE — Progress Notes (Signed)
[facility] ADULT ICU REPLACEMENT PROTOCOL FOR AM LAB REPLACEMENT ONLY  The patient does not apply for the [facility] Adult ICU Electrolyte Replacment Protocol based on the criteria listed below:   1. Is GFR >/= 50 ml/min? no  Patient's GFR today is 25 2. Is urine output >/= 0.5 ml/kg/hr for the last 8 hours? no Patient's UOP is  ml/kg/hr 3. Is BUN < 30 mg/dL? no  Patient's BUN today is 181 4. Abnormal electrolyte(s): K 3.4 5. Ordered repletion with:  6. If a panic level lab has been reported, has the CCM MD in charge been notified? yes.   Physician:  Dr Marolyn Haller, [place]aan Holzer A 07/27/2012 5:26 AM

## 2012-07-27 NOTE — Progress Notes (Signed)
CRITICAL CARE RESIDENT NOTE Interim Progress Note  Labs    RESULT INTERVENTION TAKEN  1. Hypernatremia 157 D5W 10-20 cc/hr  2. Hypokalemia 3.4  ETT 40 mEQ x 1   3.      Signed: Cresenciano Genre, MD  PGY-1, Internal Medicine Resident Pager: [phone]) 07/27/2012, 5:14 AM

## 2012-07-27 NOTE — Progress Notes (Signed)
[facility] Progress Note Patient Name: [name] DOB: Nov 30, 1940 MRN: [id number]  Date of Service  07/27/2012   HPI/Events of Note  Nurse call for new rash, present on arms, back, palms of hands, red, blanching.  Newly started on zaroxalyn and diamox   eICU Interventions  Held diamox and zaroxalyn   Intervention Category Intermediate Interventions: Medication change / dose adjustment  Sagan Maselli K. 07/27/2012, 6:22 PM

## 2012-07-27 NOTE — Progress Notes (Signed)
[facility] Progress Note Patient Name: [name] DOB: [birth year] MRN: [id number]  Date of Service  07/27/2012   HPI/Events of Note   Fever 103  eICU Interventions  Blood cx Empiric vanc tylenol   Intervention Category Intermediate Interventions: Other:  Tita Terhaar V. 07/27/2012, 9:23 PM

## 2012-07-28 ENCOUNTER — Inpatient Hospital Stay (HOSPITAL_COMMUNITY): Payer: BC Managed Care – PPO

## 2012-07-28 LAB — BASIC METABOLIC PANEL
Calcium: 8.9 mg/dL (ref 8.4–10.5)
GFR calc Af Amer: 25 mL/min — ABNORMAL LOW (ref >90)
GFR calc non Af Amer: 22 mL/min — ABNORMAL LOW (ref >90)
Glucose, Bld: 191 mg/dL — ABNORMAL HIGH (ref 70–99)
Sodium: 158 mEq/L — ABNORMAL HIGH (ref 135–145)

## 2012-07-28 LAB — OCCULT BLOOD X 1 CARD TO LAB, STOOL: Fecal Occult Bld: POSITIVE — AB

## 2012-07-28 LAB — GLUCOSE, CAPILLARY
Glucose-Capillary: 194 mg/dL — ABNORMAL HIGH (ref 70–99)
Glucose-Capillary: 197 mg/dL — ABNORMAL HIGH (ref 70–99)
Glucose-Capillary: 187 mg/dL — ABNORMAL HIGH (ref 70–99)

## 2012-07-28 LAB — CBC
MCH: 29.8 pg (ref 26.0–34.0)
Platelets: 179 10*3/uL (ref 150–400)
RBC: 3.49 MIL/uL — ABNORMAL LOW (ref 4.22–5.81)
WBC: 14 10*3/uL — ABNORMAL HIGH (ref 4.0–10.5)

## 2012-07-28 LAB — BLOOD GAS, ARTERIAL
Bicarbonate: 39.4 mEq/L — ABNORMAL HIGH (ref 20.0–24.0)
O2 Saturation: 90.9 %
PEEP: 10 cmH2O
pO2, Arterial: 63.8 mmHg — ABNORMAL LOW (ref 80.0–100.0)

## 2012-07-28 LAB — PHOSPHORUS: Phosphorus: 6.5 mg/dL — ABNORMAL HIGH (ref 2.3–4.6)

## 2012-07-28 LAB — MAGNESIUM: Magnesium: 2.1 mg/dL (ref 1.5–2.5)

## 2012-07-28 MED ORDER — NOREPINEPHRINE BITARTRATE 1 MG/ML IJ SOLN
2.0000 ug/min | INTRAVENOUS | Status: DC
  Administered 2012-07-28: 2 ug/min via INTRAVENOUS
  Administered 2012-07-31: 5 ug/min via INTRAVENOUS
  Administered 2012-08-01: 25 ug/min via INTRAVENOUS
  Administered 2012-08-02 (×2): 20 ug/min via INTRAVENOUS
  Filled 2012-07-28 (×5): qty 16

## 2012-07-28 MED ORDER — SODIUM CHLORIDE 0.9 % IV BOLUS (SEPSIS)
500.0000 mL | Freq: Once | INTRAVENOUS | Status: AC
  Administered 2012-07-28: 500 mL via INTRAVENOUS

## 2012-07-28 MED ORDER — NOREPINEPHRINE BITARTRATE 1 MG/ML IJ SOLN: 2.0000 ug/min | INTRAVENOUS | Status: DC

## 2012-07-28 MED ORDER — SODIUM CHLORIDE 0.9 % IV SOLN
400.0000 mg | Freq: Once | INTRAVENOUS | Status: AC
  Administered 2012-07-28: 400 mg via INTRAVENOUS
  Filled 2012-07-28: qty 4

## 2012-07-28 NOTE — Progress Notes (Signed)
[facility] Progress Note Patient Name: [name] DOB: 10/14/1940 MRN: [id number]  Date of Service  07/28/2012   HPI/Events of Note   Shock state persists  eICU Interventions  Levophed started, Did not want to give volume s/p lasix drip   Intervention Category Major Interventions: Shock - evaluation and management  Asencion Noble 07/28/2012, 5:50 AM

## 2012-07-28 NOTE — Progress Notes (Signed)
[name] DOB: [birth year] MRN: [id number]  Date of Service  07/28/2012   HPI/Events of Note   BP soft. On lasix drip  eICU Interventions  D/c lasix drip   Intervention Category Major Interventions: Hypotension - evaluation and management  Asencion Noble 07/28/2012, 2:21 AM

## 2012-07-28 NOTE — Progress Notes (Signed)
Patient arterial line came out while bed rotation on/family readjusting patient's arm.  Minimal blood loss; pressure dressing applied.  Discussed replacing line with Dr. Earnest Conroy, decision made not to replace.  Patient not on pressors anymore.

## 2012-07-28 NOTE — Progress Notes (Signed)
PULMONARY  / CRITICAL CARE MEDICINE  Name: [name] MRN: [id number] DOB: Mar 24, 1941    LOS: 63  REFERRING MD :  [name]  CHIEF COMPLAINT:  Respiratory failure and pneumonia  BRIEF PATIENT DESCRIPTION: 72 yoM with HTN, CHF admitted to [facility] on 1/18 on transfer from [facility] ER with sepsis and ARDS secondary to influenza A/H1N1 multifocal pneumonia.   LINES / TUBES: L. CVL 1/18 >>  ETT 1/18 >>  CULTURES: [facility] [phone]): 1/18 Blood culture x 2 >> strep pneumoniae in 2 of 2 pansenstive   [place]: 1/18 BC >> NGTD 1/18 Respiratory virus panel >>>  1/18 UC >> negative 1/18 Flu PCR >> Influenza A/ H1N1 1/18 lactate >> 2.3  1/19 PCT >> 8 >> 6.5 1/19 Resp: rare GPC in pairs. No growth at 2 days.   1/25 BC x2:   ANTIBIOTICS: 1/18 Vanc >> 1/23 restarted 1/25 >>> 1/18 Levaquin >> 1/23 1/18 Tamiflu >> planned end date 1/28 1/18 Cefepime >>1/23 1/23 Rocephin 1/23>>> 1/23 1/24 Levofloxacin >>>   SIGNIFICANT EVENTS:  1/18 Transfer to [facility] from [place] for respiratory failure started on Nimbex protocol for SEVERE ARDS.   LEVEL OF CARE:  ICU PRIMARY SERVICE:  PCCM CONSULTANTS:  None CODE STATUS: Full DIET:  Tube feeds DVT Px:  Heparin SQ GI Px:  Protonix  HISTORY OF PRESENT ILLNESS:  72 yo male with known hx of HTN, CHF presented to [facility] ER on 1/18 w/ 1 week hx of cough , congestion w/ progressively worsening dyspnea . Hypoxia in ER , decompensated requiring intubation . CXR w/ multilobar PNA . Hypotension unresponsive to fluid challenged requiring pressors. CCM asked to accept at [facility] ICU 1/18. Transported via [facility] . Renal failure w/ scr ~4 . Received 6L + in ER.   SUBJECTIVE / INTERVAL HISTORY:  Intubated and sedated.  Fever overnight to 102.9.  Recultured and Vanc restarted.  O2 sats doing okay on PEEP of 10 and FiO2 of 50%.  Negative 1.4L yesterday.  Creatinine stable.  Restarted on Levophed secondary to hypotension.  Lasix drip and diuretics  stopped yesterday evening.   VITAL SIGNS: Temp:  [98.3 F (36.8 C)-102.9 F (39.4 C)] 102.9 F (39.4 C) (01/26 0400) Pulse Rate:  [102-134] 126  (01/26 0500) Resp:  [22-41] 35  (01/26 0500) BP: (87-157)/(44-86) 92/52 mmHg (01/26 0350) SpO2:  [90 %-93 %] 90 % (01/26 0500) Arterial Line BP: (78-162)/(42-76) 88/47 mmHg (01/26 0500) FiO2 (%):  [49.2 %-59.8 %] 50 % (01/26 0500) Weight:  [231 lb 7.7 oz (105 kg)] 231 lb 7.7 oz (105 kg) (01/26 0500) HEMODYNAMICS: CVP:  [10 mmHg-16 mmHg] 10 mmHg VENTILATOR SETTINGS: Vent Mode:  [-] PRVC FiO2 (%):  [49.2 %-59.8 %] 50 % Set Rate:  [35 bmp] 35 bmp Vt Set:  [400 mL] 400 mL PEEP:  [9.5 cmH20-10.4 cmH20] 9.9 cmH20 Plateau Pressure:  [18 cmH20-26 cmH20] 26 cmH20 INTAKE / OUTPUT: Intake/Output      01/25 0701 - 01/26 0700 01/26 0701 - 01/27 0700   I.V. (mL/kg) 651 (6.2)    NG/GT 2580    IV Piggyback     Total Intake(mL/kg) 3231 (30.8)    Urine (mL/kg/hr) 4605 (1.8)    Stool     Total Output 4605    Net -1374           PHYSICAL EXAMINATION: General: Intubated and sedated  Head: Normocephalic, atraumatic.  Lungs:  Normal respiratory effort. Mild bronchial breath sounds bilaterally.   Heart: Tachycardic rate with regular  rhythm.   Abdomen:  BS normal. Soft, mildly Distended, non-tender.   Extremities: No pretibial edema.  Skin: Lacy red rash over the anterior chest and upper arms and legs. No induration. Improved from previous.     IMAGING:  1/18 - CXR  - Bilateral pulmonary opacities are stable consistent with pneumonia. ET tube 4.2 cm above the carina.  1/19 - CT Abd - Abdominal aortic aneurysm measuring 6.4 cm AP diameter. 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 Scattered free fluid in the pelvis and pericolic gutters as well as right lower quadrant mesentery. No definite evidence of aneurysm rupture but the size of the aneurysm places the patient at increased risk. Bilateral lower lung consolidation and small  pleural effusions.  1/19 - CXR - Minimal progression of right base air space disease. Otherwise, similar left greater than right pulmonary opacities. Infection and/or alveolar edema. Small left pleural effusion.  1/20 - CXR - Stable bilateral infiltrates left greater than right.  1/22 CXR: mildly improved aeration on the right continued bilateral infiltrates.    1/23 CXR: unchanged.   1/24 CXR: Unchanged  MEDICATIONS: Infusions    . dextrose 10 mL/hr at 07/27/12 0515  . fentaNYL infusion INTRAVENOUS 50 mcg/hr (07/28/12 0506)  . midazolam (VERSED) infusion 2 mg/hr (07/28/12 0507)  . norepinephrine (LEVOPHED) Adult infusion 2 mcg/min (07/28/12 [id number])   Scheduled    . antiseptic oral rinse  15 mL Mouth Rinse QID  . artificial tears  1 application Both Eyes [id number]  . aspirin  81 mg Per Tube Daily  . chlorhexidine  15 mL Mouth Rinse BID  . feeding supplement (OXEPA)  1,000 mL Per Tube Q24H  . feeding supplement  60 mL Per Tube TID  . free water  300 mL Per Tube Q4H  . heparin  5,000 Units Subcutaneous Q8H  . hydrocortisone sod succinate (SOLU-CORTEF) injection  25 mg Intravenous Q12H  . insulin aspart  2 Units Subcutaneous Q4H  . insulin aspart  2-6 Units Subcutaneous Q4H  . insulin glargine  15 Units Subcutaneous Q24H  . levofloxacin (LEVAQUIN) IV  750 mg Intravenous Q48H  . multivitamin  5 mL Per Tube Daily  . oseltamivir  150 mg Per Tube Daily  . pantoprazole sodium  40 mg Per Tube Daily  . vancomycin  1,000 mg Intravenous Q24H   DIAGNOSES: Active Problems:  Acute respiratory failure with hypoxia  ARDS (adult respiratory distress syndrome)  Acute renal failure  ASSESSMENT / PLAN:  PULMONARY  Lab 07/28/12 0415 07/27/12 0345 07/26/12 0430  PHART 7.395 7.389 7.353  PCO2ART 65.6* 64.4* 65.2*  PO2ART 63.8* 74.2* 93.0  HCO3 39.4* 38.0* 35.9*  TCO2 41.4 40.0 38   A:  1) Multilobar PNA - respiratory viral panel shows influenza only.  flu PCR positive for H1N1. sputum  culture from [place] showed S. Pneumo.  [facility] sputum culture shows GPC in pairs which is likely S. pneumo but there was no growth in the final culture.  2) Influenza A/ H1N1 infection: Positive flu PCR and now resp viral panel shows no other viruses.   3) Severe ARDS - continued improvement with decreasing oxygen requirements.  Maintaining sats off nimbex.  Tolerating recruitments. STill likely will need trach to full wean.  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 - Continue Tamilfu day 10 of 10 renally dosed.  - Continue per ARDS protocol - Permissive hypercapnia ok - want pH > 7.2. - Maintain low tidal  volume ventilation. - Continue bronchodilators. - Will need smoking cessation education after extubation. - Hold FiO2 at 50%.   CARDIOVASCULAR  Lab 07/25/12 1000  CKTOTAL --  CKMB --  TROPONINI 0.64*   A:  1) Sepsis - 2/2 multilobar PNA, influenza A/ H1N1 2) Elevated troponins - question of demand ischemia vs NSTEMI in setting of sepsis vs renal failure. No new EKG changes. Recheck trop stable with no rise.  Cr 2.8 so likely will hang around longer.  3) AAA - 6.4 cm per CT Abd 1/19.  Will continue to monitor unless developing hypotension or hemodynamic compromise. 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  P:  - Monitor AAA for now, low threshold for vascular consult if worsening hemodynamically will monitor for now.  Will need repair when better given size of AAA.  - Continue aspirin 81mg  daily. - Spoke with cards, not eliminating trop due to renal failure.  RENAL  Lab 07/28/12 0540 07/27/12 1658 07/27/12 0334  NA 158* 155* 154*  K 3.7 3.9 3.4*  CL 111 106 106  CO2 38* 41* 37*  BUN 193* 181* 181*  CREATININE 2.78* 2.71* 2.49*  GLUCOSE 191* 278* 172*   A:  1) Acute renal failure  - likely 2/2 volume depletion in setting of sepsis, possible ATN in setting of hypotension, mildly improved over the last few days.  CVP stable 11-13.  UOP brisk  with lasix drip again overnight.  Creatinine stable but BUN still very high.  Likely ATN resolving.  Likely hold diuresis with hypotension overnight.  2) Hypokalemia - repleted yesterday 3) Hypernatremia: Na rising 158 today.  Getting free water and 10-20 D5.    P:  - Continue to monitor. - Renally dose medications. - Continue tube feeds. - K supplementation.  - Continue free water 300 q4 and hold lasix to allow Na to normalize. - KVO IVF.   GASTROINTESTINAL  Lab 07/23/12 0440 07/22/12 0417  AST 37 34  ALT 33 30  ALKPHOS 155* 75  BILITOT 0.5 0.6  PROT 6.3 5.5*  ALBUMIN 1.6* 1.5*   A: No acute issues. P:  - Continue tube feeds.  HEMATOLOGIC  Lab 07/28/12 0540 07/27/12 0334 07/26/12 0415  WBC 14.0* 14.3* 13.7*  HGB 10.4* 10.7* 10.7*  HCT 33.2* 33.0* 33.3*  PLT 179 163 146*  APTT -- -- --  INR -- -- --   A:  1) Acute normocytic anemia - likely related to critical illness, unknown baseline. No obvious source of bleed. 2) Thrombocytopenia - unknown baseline, low normal in 05/2008. No obvious source of bleed. Likely related to critical illness. Stable.  P:  - Continue to monitor CBC.  INFECTIOUS  Lab 07/28/12 0540 07/27/12 0334 07/26/12 0415 07/24/12 0427 07/23/12 0440 07/22/12 0417 07/21/12 1029  WBC 14.0* 14.3* 13.7* -- -- -- --  NEUTROABS -- -- -- 12.7* 23.8* -- --  LATICACIDVEN -- -- -- -- -- -- --  PROCALCITON -- -- -- -- 4.39 6.50 8.24   A:  1) Sepsis secondary to multilobar PNA, possible influenza 2) S. Pneumonia bacteremia - 2/2 blood cultures positive with S. Pneumo, pan sensitive from [place].  BC here clear so far.  Resp culture from [facility] showing [place] in pairs but no growth on final culture. Plan for narrowing of therapy to Levaquin for 14 days.  Today (1/26) is day 9 of antibiotics. With increased fever to 102.9 recultured and Vanc restarted.  3. Drug rash: likely secondary to  Cephalosporins.  Changed to Levaquin 1/24 for the rest of the course. Also question  possibility fever yesterday a drug fever.  4. Leukocytosis - improving, continues to be afebrile.    P:  - Cortisol of 33, Continue to taper steroids down.  - Changed Rocephin to Levaquin given sensitivities from blood culture from [facility] 9/14 days.  - Continue Tamiflu day 10/10.   ENDOCRINE / RHEUMATOLOGIC  Lab 07/28/12 0426 07/28/12 0029 07/27/12 1956 07/27/12 1558 07/27/12 1142  GLUCAP 173* 218* 219* 223* 244*   A: 1) Prediabetes - blood sugars stable overnight. P:  - ICU hyperglycemia protocol. - Lantus 15 units.  Will monitor with taper of steroids, down to 25 q12.   NEUROLOGIC / PSYCHIATRIC RAAS Goal: -3 CAM-ICU: Cannot assess     A: 1) Acute encephalopathy - in setting of sepsis.  P:  - Continue with RAAS goal of -3. - Nimbex - D/Ced  CLINICAL SUMMARY: 88 yoM with HTN, CHF admitted to [facility] MICU on transfer from [facility] on 07/20/2012 secondary to sepsis in setting  of influenza A/ H1N1 / multilobar PNA. Intubated in ED secondary to hypoxic respiratory failure. Has ARDS on the protocol nimbex 1/19 to 1/24.  Started empirically on broad spectrum ABx on admission and narrowed to Levaquin for 14 days of total treatment.  Fever overnight to 102.9 question drug fever vs need to expand ABX.  UA not revealing and CXR with no new infiltrates.    PRIBULA,CHRISTOPHER, MD Internal Medicine Resident, PGY III Co-Chief Resident, Internal Medicine Pager: [phone] 07/28/2012 7:24 AM   Hold further diureses today.  Stool occult blood.  Likely stop stress dose steroids in AM, d/c tamiflu in AM and abx as above.   CC time 35 min.  Patient seen and examined, agree with above note.  I dictated the care and orders written for this patient under my direction.  Rush Farmer, MD [phone]

## 2012-07-28 NOTE — Progress Notes (Signed)
[facility] Progress Note Patient Name: [name] DOB: Feb 25, 1941 MRN: [id number]  Date of Service  07/28/2012   HPI/Events of Note  Persistent hypotension with diuresis.  eICU Interventions  500cc NS bolus    Intervention Category Major Interventions: Hypotension - evaluation and management  Asencion Noble 07/28/2012, 4:46 AM

## 2012-07-29 ENCOUNTER — Inpatient Hospital Stay (HOSPITAL_COMMUNITY): Payer: BC Managed Care – PPO

## 2012-07-29 LAB — BLOOD GAS, ARTERIAL
Bicarbonate: 39.9 mEq/L — ABNORMAL HIGH (ref 20.0–24.0)
PEEP: 8 cmH2O
TCO2: 41.8 mmol/L (ref 0–100)
pH, Arterial: 7.428 (ref 7.350–7.450)
pO2, Arterial: 53 mmHg — ABNORMAL LOW (ref 80.0–100.0)

## 2012-07-29 LAB — BASIC METABOLIC PANEL
BUN: 180 mg/dL — ABNORMAL HIGH (ref 6–23)
CO2: 40 mEq/L (ref 19–32)
Calcium: 9.1 mg/dL (ref 8.4–10.5)
Chloride: 115 mEq/L — ABNORMAL HIGH (ref 96–112)
Creatinine, Ser: 2.22 mg/dL — ABNORMAL HIGH (ref 0.50–1.35)
GFR calc Af Amer: 32 mL/min — ABNORMAL LOW (ref >90)
GFR calc non Af Amer: 27 mL/min — ABNORMAL LOW (ref >90)
Glucose, Bld: 185 mg/dL — ABNORMAL HIGH (ref 70–99)
Glucose, Bld: 222 mg/dL — ABNORMAL HIGH (ref 70–99)
Potassium: 2.7 mEq/L — CL (ref 3.5–5.1)
Potassium: 3.4 mEq/L — ABNORMAL LOW (ref 3.5–5.1)
Sodium: 161 mEq/L (ref 135–145)

## 2012-07-29 LAB — CBC
Hemoglobin: 11.1 g/dL — ABNORMAL LOW (ref 13.0–17.0)
MCH: 29.8 pg (ref 26.0–34.0)
MCHC: 31.1 g/dL (ref 30.0–36.0)
Platelets: 161 10*3/uL (ref 150–400)
RBC: 3.72 MIL/uL — ABNORMAL LOW (ref 4.22–5.81)

## 2012-07-29 LAB — GLUCOSE, CAPILLARY
Glucose-Capillary: 189 mg/dL — ABNORMAL HIGH (ref 70–99)
Glucose-Capillary: 237 mg/dL — ABNORMAL HIGH (ref 70–99)

## 2012-07-29 LAB — POCT I-STAT 3, ART BLOOD GAS (G3+)
O2 Saturation: 90 %
pCO2 arterial: 79.1 mmHg (ref 35.0–45.0)
pH, Arterial: 7.347 — ABNORMAL LOW (ref 7.350–7.450)

## 2012-07-29 LAB — PROTIME-INR: INR: 1.38 (ref 0.00–1.49)

## 2012-07-29 LAB — PHOSPHORUS: Phosphorus: 6.2 mg/dL — ABNORMAL HIGH (ref 2.3–4.6)

## 2012-07-29 LAB — APTT: aPTT: 35 seconds (ref 24–37)

## 2012-07-29 LAB — MAGNESIUM: Magnesium: 2.1 mg/dL (ref 1.5–2.5)

## 2012-07-29 MED ORDER — FREE WATER
400.0000 mL | Status: DC
  Administered 2012-07-29 – 2012-08-07 (×53): 400 mL

## 2012-07-29 MED ORDER — MIDAZOLAM HCL 2 MG/2ML IJ SOLN
1.0000 mg | INTRAMUSCULAR | Status: DC | PRN
  Administered 2012-08-02 – 2012-08-05 (×2): 2 mg via INTRAVENOUS
  Administered 2012-08-09: 1 mg via INTRAVENOUS
  Administered 2012-08-10: 2 mg via INTRAVENOUS
  Filled 2012-07-29 (×4): qty 2

## 2012-07-29 MED ORDER — POTASSIUM CHLORIDE 20 MEQ/15ML (10%) PO LIQD
40.0000 meq | ORAL | Status: AC
  Administered 2012-07-29 (×3): 40 meq via ORAL
  Filled 2012-07-29 (×3): qty 30

## 2012-07-29 NOTE — Progress Notes (Signed)
CRITICAL VALUE ALERT  Critical value received: Na, CO2 & K (161,41 AND 2.7  Date of notification:  07/29/12  Time of notification: 0510  Critical value read back:YES  Nurse who received alert:  Keni Wafer  MD notified (1st page): Dr Margaretha Seeds  Time of first page: 0515  MD notified (2nd page):  Time of second page:  Responding MD: Dr Margaretha Seeds  Time MD responded:  [phone]

## 2012-07-29 NOTE — Progress Notes (Signed)
CRITICAL VALUE ALERT  Critical value received:  Sodium 162 & CO2 40  Date of notification:  07/29/12  Time of notification:  [id number]  Critical value read back:yes  Nurse who received alert:  D. Olevia Bowens   MD notified (1st page):  Titus Mould  Time of first page:  1145  MD notified (2nd page):  Time of second page:  Responding MD:  Titus Mould  Time MD responded:  1145

## 2012-07-29 NOTE — Progress Notes (Signed)
[facility] ADULT ICU REPLACEMENT PROTOCOL FOR AM LAB REPLACEMENT ONLY  The patient does not apply for the [facility] Adult ICU Electrolyte Replacment Protocol based on the criteria listed below:   3. Is BUN <40 mg/dL? no  Patient's BUN today is 182 4. Abnormal electrolyte(s): K 2.7  6. If a panic level lab has been reported, has the CCM MD in charge been notified? yes.   Physician:  Selena Batten notified and asked that resident be notified to write orders for electrolyte repletion   Christeen Douglas 07/29/2012 5:57 AM

## 2012-07-29 NOTE — Progress Notes (Signed)
  Lab 07/29/12 0425 07/28/12 0415 07/27/12 0345 07/26/12 0430 07/25/12 0434  PHART 7.428 7.395 7.389 7.353 7.300*  PCO2ART 61.1* 65.6* 64.4* 65.2* 62.5*  PO2ART 53.0* 63.8* 74.2* 93.0 79.0*  HCO3 39.9* 39.4* 38.0* 35.9* 30.7*  TCO2 41.8 41.4 40.0 38 33  O2SAT 86.8 90.9 95.6 96.0 94.0   Called by RN critical lab value CO2 61.1.  He has underlying OSA and has been in the 60s.  PO2 decreasing.  RT will increase FiO2 to 60% and PEEP to 10  [place] MD [phone]

## 2012-07-29 NOTE — Progress Notes (Signed)
CRITICAL VALUE ALERT  Critical value received: PH, PCO2,PO2 (7.428,61.1,53.0)RESPECTIVELY Date of notification:  07/29/12  Time of notification:  0427  Critical value read back:YES  Nurse who received alert: Merl Bommarito RN  MD notified (1st page):Dr Olivia Mackie  Time of first page:  0430  MD notified (2nd page):  Time of second page:  Time MD responded:0430

## 2012-07-29 NOTE — Progress Notes (Addendum)
PULMONARY  / CRITICAL CARE MEDICINE  Name: [name] MRN: [id number] DOB: [birth year]    LOS: 13  REFERRING MD :  [name]  CHIEF COMPLAINT:  Respiratory failure and pneumonia  BRIEF PATIENT DESCRIPTION: 72 yoM with HTN, CHF admitted to [facility] on 1/18 on transfer from [facility] ER with sepsis and ARDS secondary to influenza A/H1N1 multifocal pneumonia.   LINES / TUBES: L. CVL 1/18 >>  ETT 1/18 >>  CULTURES: [facility] [phone]): 1/18 Blood culture x 2 >> strep pneumoniae in 2 of 2 pansenstive   [place]: 1/18 BC >> NGTD 1/18 Respiratory virus panel >>>  1/18 UC >> negative 1/18 Flu PCR >> Influenza A/ H1N1 1/18 lactate >> 2.3  1/19 PCT >> 8 >> 6.5 1/19 Resp: rare GPC in pairs. No growth at 2 days.   1/25 BC x2:   ANTIBIOTICS: 1/18 Vanc >> 1/23 restarted 1/25 >>> 1/18 Levaquin >> 1/23 1/18 Tamiflu >> planned end date 1/28 1/18 Cefepime >>1/23 1/23 Rocephin 1/23>>> 1/23 1/24 Levofloxacin >>>   SIGNIFICANT EVENTS:  1/18 Transfer to [facility] from [place] for respiratory failure started on Nimbex protocol for SEVERE ARDS.   LEVEL OF CARE:  ICU PRIMARY SERVICE:  PCCM CONSULTANTS:  None CODE STATUS: Full DIET:  Tube feeds DVT Px:  Heparin SQ GI Px:  Protonix  HISTORY OF PRESENT ILLNESS:  72 yo male with known hx of HTN, CHF presented to [facility] ER on 1/18 w/ 1 week hx of cough , congestion w/ progressively worsening dyspnea . Hypoxia in ER , decompensated requiring intubation . CXR w/ multilobar PNA . Hypotension unresponsive to fluid challenged requiring pressors. CCM asked to accept at [facility] ICU 1/18. Transported via [facility] . Renal failure w/ scr ~4 . Received 6L + in ER.   SUBJECTIVE / INTERVAL HISTORY:  Intubated and sedated.  Fever overnight to 103.4.  Recultured and Vanc restarted 1/26.  Attempted to wean his FiO2 and PEEP and was unable to hold his oxygenation.  Increased back to 60% and PEEP of 10 per ARDS protocol.  Creatinine improving and UOP without  lasix was 3L.  Able to wean off levophed yesterday.   VITAL SIGNS: Temp:  [97.9 F (36.6 C)-103.4 F (39.7 C)] 97.9 F (36.6 C) (01/27 0422) Pulse Rate:  [92-121] 106  (01/27 0700) Resp:  [10-35] 31  (01/27 0700) BP: (60-120)/(35-73) 112/69 mmHg (01/27 0700) SpO2:  [87 %-97 %] 91 % (01/27 0700) Arterial Line BP: (65-131)/(39-63) 130/60 mmHg (01/26 1700) FiO2 (%):  [39.8 %-60.2 %] 60.2 % (01/27 0700) HEMODYNAMICS: CVP:  [10 mmHg-15 mmHg] 12 mmHg VENTILATOR SETTINGS: Vent Mode:  [-] PRVC FiO2 (%):  [39.8 %-60.2 %] 60.2 % Set Rate:  [18 bmp-35 bmp] 35 bmp Vt Set:  [400 mL] 400 mL PEEP:  [7.8 cmH20-10 cmH20] 10 cmH20 Plateau Pressure:  [20 cmH20-23 cmH20] 23 cmH20 INTAKE / OUTPUT: Intake/Output      01/26 0701 - 01/27 0700 01/27 0701 - 01/28 0700   I.V. (mL/kg) 545.1 (5.2)    NG/GT 1320    IV Piggyback 350    Total Intake(mL/kg) 2215.1 (21.1)    Urine (mL/kg/hr) 2715 (1.1)    Stool 500    Total Output 3215    Net -999.9           PHYSICAL EXAMINATION: General: Intubated and sedated  Head: Normocephalic, atraumatic, right eye with corneal scratch healing.   Lungs:  Normal respiratory effort. Mild bronchial breath sounds bilaterally.   Heart: Tachycardic rate with  regular rhythm.   Abdomen:  BS normal. Soft, mildly Distended, non-tender.   Extremities: No pretibial edema.  Skin: Confluent rash over the back, lower abdomen, and dorsal arms.  No induration or skin sloughing.     IMAGING:  1/18 - CXR  - Bilateral pulmonary opacities are stable consistent with pneumonia. ET tube 4.2 cm above the carina.  1/19 - CT Abd - Abdominal aortic aneurysm measuring 6.4 cm AP diameter. 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 Scattered free fluid in the pelvis and pericolic gutters as well as right lower quadrant mesentery. No definite evidence of aneurysm rupture but the size of the aneurysm places the patient at increased risk. Bilateral lower lung consolidation  and small pleural effusions.  1/19 - CXR - Minimal progression of right base air space disease. Otherwise, similar left greater than right pulmonary opacities. Infection and/or alveolar edema. Small left pleural effusion.  1/20 - CXR - Stable bilateral infiltrates left greater than right.  1/22 CXR: mildly improved aeration on the right continued bilateral infiltrates.    1/23 CXR: unchanged.   1/24 CXR: Unchanged  MEDICATIONS: Infusions    . dextrose 50 mL (07/29/12 0602)  . fentaNYL infusion INTRAVENOUS 50 mcg/hr (07/28/12 0506)  . midazolam (VERSED) infusion 2 mg/hr (07/28/12 0507)  . norepinephrine (LEVOPHED) Adult infusion Stopped (07/28/12 1400)   Scheduled    . antiseptic oral rinse  15 mL Mouth Rinse QID  . aspirin  81 mg Per Tube Daily  . chlorhexidine  15 mL Mouth Rinse BID  . feeding supplement (OXEPA)  1,000 mL Per Tube Q24H  . feeding supplement  60 mL Per Tube TID  . free water  400 mL Per Tube Q4H  . heparin  5,000 Units Subcutaneous Q8H  . hydrocortisone sod succinate (SOLU-CORTEF) injection  25 mg Intravenous Q12H  . insulin aspart  2 Units Subcutaneous Q4H  . insulin aspart  2-6 Units Subcutaneous Q4H  . insulin glargine  15 Units Subcutaneous Q24H  . levofloxacin (LEVAQUIN) IV  750 mg Intravenous Q48H  . multivitamin  5 mL Per Tube Daily  . oseltamivir  150 mg Per Tube Daily  . pantoprazole sodium  40 mg Per Tube Daily  . potassium chloride  40 mEq Oral Q4H  . vancomycin  1,000 mg Intravenous Q24H   DIAGNOSES: Active Problems:  Acute respiratory failure with hypoxia  ARDS (adult respiratory distress syndrome)  Acute renal failure  ASSESSMENT / PLAN:  PULMONARY  Lab 07/29/12 0425 07/28/12 0415 07/27/12 0345  PHART 7.428 7.395 7.389  PCO2ART 61.1* 65.6* 64.4*  PO2ART 53.0* 63.8* 74.2*  HCO3 39.9* 39.4* 38.0*  TCO2 41.8 41.4 40.0   A:  1) Multilobar PNA - respiratory viral panel shows influenza only.  flu PCR positive for H1N1. sputum  culture from [place] showed S. Pneumo.  [facility] sputum culture shows GPC in pairs which is likely S. pneumo but there was no growth in the final culture. Treating for flu and S. pneumo.  2) Influenza A/ H1N1 infection: Positive flu PCR and now resp viral panel shows no other viruses.   3) Severe ARDS - Able to maintain off nimbex after a long period on.  Attempted to wean to 40 and 8 of PEEP but unable to tolerate with PO2 53. Increased to FiO2 60% and PEEP 10.   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 - Continue Tamilfu day 10 of 10 renally dosed.  -  Continue to wean per ARDS protocol, prvc 35 400, peep 10 , 60% - failed with drop peep / fio2, consider reduction as PH over 7.4, permissive is the goal -reduce rate 24, abg to fall, remain on 6 cc/kg - Permissive hypercapnia - Maintain low tidal volume ventilation. - Limit bronchodilators, will worsen outcomes, dc  - Consider trach early this week. -cvp 10, FACTT, would have goal 4, lasix when able, see renal   CARDIOVASCULAR  Lab 07/25/12 1000  CKTOTAL --  CKMB --  TROPONINI 0.64*   A:  1) Sepsis - 2/2 multilobar PNA, influenza A/ H1N1 2) Elevated troponins - question of demand ischemia vs NSTEMI in setting of sepsis vs renal failure. No new EKG changes. Recheck trop stable with no rise.  With renal failure will hang around longer.  3) AAA - 6.4 cm per CT Abd 1/19.  Will continue to monitor unless developing hypotension or hemodynamic compromise. 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  P:  - Monitor AAA for now, low threshold for vascular consult if worsening hemodynamically will monitor for now.  Will need repair when better given size of AAA.  - Continue aspirin 81mg  daily. - Spoke with cards nothing to do.   RENAL  Lab 07/29/12 0430 07/28/12 0540 07/27/12 1658  NA 161* 158* 155*  K 2.7* 3.7 3.9  CL 113* 111 106  CO2 41* 38* 41*  BUN 182* 193* 181*  CREATININE 2.28* 2.78* 2.71*  GLUCOSE  185* 191* 278*   A:  1) Acute renal failure  - likely 2/2 volume depletion in setting of sepsis, Likely ATN in setting of hypotension improving currently.  CVP stable 11-13.  UOP brisk without lasix yesterday.  Creatinine improving and BUN finally coming down.   2) Hypokalemia - repleted today 3) Hypernatremia: Na increasing again to 161 today.  Free water deficit of 6.8L.    P:  - Continue to monitor lytes in am  - Renally dose medications. - Continue tube feeds. - Kcl 40 mEq q4 x3 - recheck Bmet at 1600 - increase free water to 400 q4 and increase D5 to 75 ml/hr to correct over the next 48 hours.  - Bmet in the AM.   GASTROINTESTINAL  Lab 07/23/12 0440  AST 37  ALT 33  ALKPHOS 155*  BILITOT 0.5  PROT 6.3  ALBUMIN 1.6*   A: No acute issues. P:  - Continue tube feeds -last BM ppi  HEMATOLOGIC  Lab 07/29/12 0430 07/28/12 0540 07/27/12 0334  WBC 17.9* 14.0* 14.3*  HGB 11.1* 10.4* 10.7*  HCT 35.7* 33.2* 33.0*  PLT 161 179 163  APTT -- -- --  INR -- -- --   A:  1) Acute normocytic anemia - likely related to critical illness, unknown baseline. FOBT positive yesterday.  HgB stable.   2) Thrombocytopenia - unknown baseline, low normal in 05/2008. No obvious source of bleed. Likely related to critical illness. Stable.  3) hemoconcetration P:  - Continue to monitor CBC with pos balance -repeat inr -sub q hep  INFECTIOUS  Lab 07/29/12 0430 07/28/12 0540 07/27/12 0334 07/24/12 0427 07/23/12 0440  WBC 17.9* 14.0* 14.3* -- --  NEUTROABS -- -- -- 12.7* 23.8*  LATICACIDVEN -- -- -- -- --  PROCALCITON -- -- -- -- 4.39   A:  1) Sepsis secondary to multilobar PNA and influenza 2) S. Pneumonia bacteremia - 2/2 blood cultures positive with S. Pneumo, pan sensitive from [place].  BC  negative here at [facility]. Resp culture from [facility] showing [place] in pairs but no growth on final culture. Plan for narrowing of therapy to Levaquin for 14 days.  Today (1/26) is day 10 of antibiotics. With  increased fever to 102.9 recultured and Vanc restarted.  3. Drug rash: likely secondary to Cephalosporins.  Changed to Levaquin 1/24 for the rest of the course. Also question possibility fever yesterday a drug fever.  4. Leukocytosis - improving, continues to be afebrile.    P:  - Cortisol of 33, stop stress dose steroids today.  - Changed Rocephin to Levaquin given sensitivities from blood culture from [facility] 10/14 days.  - Continue Tamiflu day 10/10. -if fevers continued, consider CT chest, necrotizing pna, empyema? (high risk)  ENDOCRINE / RHEUMATOLOGIC  Lab 07/29/12 0354 07/29/12 0010 07/28/12 1940 07/28/12 1539 07/28/12 1140  GLUCAP 176* 189* 187* 252* 197*   A: 1) Prediabetes - blood sugars stable overnight. P:  - ICU hyperglycemia protocol. - Lantus 15 units.  Will monitor with taper of steroids, down to 25 q12.   NEUROLOGIC / PSYCHIATRIC RAAS Goal: -3 CAM-ICU: Cannot assess     A: 1) Acute encephalopathy - in setting of sepsis vs now hypernatremia.  P:  - Continue with RAAS goal of -3, fent -wean versed to intermittent, dc drip Correct Na 12 in 24 hrs - Nimbex - D/Ced  CLINICAL SUMMARY: 66 yoM with HTN, CHF admitted to [facility] MICU on transfer from [facility] on 07/20/2012 secondary to sepsis in setting  of influenza A/ H1N1 / multilobar PNA. Intubated in ED secondary to hypoxic respiratory failure. Has ARDS on the protocol nimbex 1/19 to 1/24.  Started empirically on broad spectrum ABx on admission and narrowed to Levaquin for 14 days of total treatment.  Fever yesterday to 103.4 question drug fever vs need to expand ABX.  UA not revealing and CXR with no new infiltrates.  CT chest assess empyema, necrotizing?  PRIBULA,CHRISTOPHER, MD Internal Medicine Resident, PGY III Co-Chief Resident, Internal Medicine Pager: [phone] 07/29/2012 7:39 AM   Ccm time 30 min   Lavon Paganini. Titus Mould, MD, [place] Pgr: [place] [facility]

## 2012-07-29 NOTE — Progress Notes (Signed)
CRITICAL CARE RESIDENT NOTE Interim Progress Note  Called by RN regarding the following results, with the interventions as listed below:   RESULT INTERVENTION TAKEN  1. K 2.7 KCL 40 meq per tube q4 h x3 doses   2. Na 161 Increase D5 to 50 cc/hr     Signed: Rosalia Hammers, MD  PGY-III, Internal Medicine Resident  07/29/2012, 5:56 AM

## 2012-07-30 ENCOUNTER — Inpatient Hospital Stay (HOSPITAL_COMMUNITY): Payer: BC Managed Care – PPO

## 2012-07-30 LAB — BASIC METABOLIC PANEL
Calcium: 9.1 mg/dL (ref 8.4–10.5)
Chloride: 116 mEq/L — ABNORMAL HIGH (ref 96–112)
Creatinine, Ser: 1.6 mg/dL — ABNORMAL HIGH (ref 0.50–1.35)
GFR calc Af Amer: 39 mL/min — ABNORMAL LOW (ref >90)
GFR calc Af Amer: 49 mL/min — ABNORMAL LOW (ref >90)
GFR calc non Af Amer: 34 mL/min — ABNORMAL LOW (ref >90)
GFR calc non Af Amer: 42 mL/min — ABNORMAL LOW (ref >90)
Glucose, Bld: 212 mg/dL — ABNORMAL HIGH (ref 70–99)
Sodium: 161 mEq/L (ref 135–145)

## 2012-07-30 LAB — POCT I-STAT 3, ART BLOOD GAS (G3+)
Acid-Base Excess: 17 mmol/L — ABNORMAL HIGH (ref 0.0–2.0)
Bicarbonate: 43.9 mEq/L — ABNORMAL HIGH (ref 20.0–24.0)
O2 Saturation: 90 %
TCO2: 46 mmol/L (ref 0–100)
pO2, Arterial: 60 mmHg — ABNORMAL LOW (ref 80.0–100.0)

## 2012-07-30 LAB — CBC
MCH: 29.5 pg (ref 26.0–34.0)
MCHC: 29.7 g/dL — ABNORMAL LOW (ref 30.0–36.0)
Platelets: 145 10*3/uL — ABNORMAL LOW (ref 150–400)
RBC: 3.36 MIL/uL — ABNORMAL LOW (ref 4.22–5.81)

## 2012-07-30 LAB — GLUCOSE, CAPILLARY
Glucose-Capillary: 216 mg/dL — ABNORMAL HIGH (ref 70–99)
Glucose-Capillary: 173 mg/dL — ABNORMAL HIGH (ref 70–99)
Glucose-Capillary: 181 mg/dL — ABNORMAL HIGH (ref 70–99)

## 2012-07-30 LAB — MAGNESIUM: Magnesium: 2.2 mg/dL (ref 1.5–2.5)

## 2012-07-30 MED ORDER — VANCOMYCIN HCL 10 G IV SOLR
1250.0000 mg | INTRAVENOUS | Status: DC
  Administered 2012-07-30 – 2012-08-05 (×6): 1250 mg via INTRAVENOUS
  Filled 2012-07-30 (×7): qty 1250

## 2012-07-30 MED ORDER — INSULIN GLARGINE 100 UNIT/ML ~~LOC~~ SOLN
20.0000 [IU] | SUBCUTANEOUS | Status: DC
  Administered 2012-07-30: 20 [IU] via SUBCUTANEOUS

## 2012-07-30 MED ORDER — POTASSIUM CHLORIDE 20 MEQ/15ML (10%) PO LIQD
40.0000 meq | ORAL | Status: AC
  Administered 2012-07-30 (×2): 40 meq
  Filled 2012-07-30 (×2): qty 30

## 2012-07-30 MED ORDER — INSULIN ASPART 100 UNIT/ML ~~LOC~~ SOLN
0.0000 [IU] | SUBCUTANEOUS | Status: DC
  Administered 2012-07-30: 7 [IU] via SUBCUTANEOUS
  Administered 2012-07-30: 4 [IU] via SUBCUTANEOUS
  Administered 2012-07-30: 1 [IU] via SUBCUTANEOUS
  Administered 2012-07-30 – 2012-07-31 (×2): 4 [IU] via SUBCUTANEOUS
  Administered 2012-07-31: 3 [IU] via SUBCUTANEOUS
  Administered 2012-07-31 – 2012-08-01 (×5): 4 [IU] via SUBCUTANEOUS
  Administered 2012-08-01: 3 [IU] via SUBCUTANEOUS
  Administered 2012-08-01 (×2): 4 [IU] via SUBCUTANEOUS
  Administered 2012-08-02: 11 [IU] via SUBCUTANEOUS
  Administered 2012-08-02: 7 [IU] via SUBCUTANEOUS

## 2012-07-30 NOTE — Progress Notes (Signed)
ANTIBIOTIC CONSULT NOTE - Follow up  Pharmacy Consult for Vancomycin Indication:  Strep pneumo bacteremia   Allergies  Allergen Reactions  . Cephalosporins Rash    Patient Measurements: Height: 5\' 7"  (170.2 cm) Weight: 231 lb 7.7 oz (105 kg) IBW/kg (Calculated) : 66.1   Vital Signs: Temp: 98.6 F (37 C) (01/28 0756) Temp src: Rectal (01/28 0756) BP: 104/68 mmHg (01/28 0700) Pulse Rate: 91  (01/28 0700) Intake/Output from previous day: 01/27 0701 - 01/28 0700 In: 4654 [I.V.:1724; NG/GT:2630; IV Piggyback:200] Out: [id number] [Urine:3565; Stool:100]  Labs:  Basename 07/30/12 0346 07/29/12 1030 07/29/12 0430 07/28/12 0540  WBC 19.1* -- 17.9* 14.0*  HGB 9.9* -- 11.1* 10.4*  PLT 145* -- 161 179  LABCREA -- -- -- --  CREATININE 1.91* 2.22* 2.28* --  Estimated Creatinine Clearance: 41.6 ml/min (by C-G formula based on Cr of 1.91).  Microbiology: Recent Results (from the past 720 hour(s))  CULTURE, BLOOD (ROUTINE X 2)     Status: Normal   Collection Time   07/20/12  7:15 PM      Component Value Range Status Comment   Specimen Description BLOOD LEFT HAND   Final    Special Requests BOTTLES DRAWN AEROBIC AND ANAEROBIC 10CC   Final    Culture  Setup Time 07/21/2012 02:14   Final    Culture NO GROWTH 5 DAYS   Final    Report Status 07/27/2012 FINAL   Final   CULTURE, BLOOD (ROUTINE X 2)     Status: Normal   Collection Time   07/20/12  7:30 PM      Component Value Range Status Comment   Specimen Description BLOOD LEFT HAND   Final    Special Requests BOTTLES DRAWN AEROBIC AND ANAEROBIC 10CC   Final    Culture  Setup Time 07/21/2012 02:14   Final    Culture NO GROWTH 5 DAYS   Final    Report Status 07/27/2012 FINAL   Final   URINE CULTURE     Status: Normal   Collection Time   07/20/12  9:00 PM      Component Value Range Status Comment   Specimen Description URINE, CATHETERIZED   Final    Special Requests NONE   Final    Culture  Setup Time 07/21/2012 02:56   Final    Colony  Count NO GROWTH   Final    Culture NO GROWTH   Final    Report Status 07/22/2012 FINAL   Final   CULTURE, RESPIRATORY     Status: Normal   Collection Time   07/21/12  6:44 AM      Component Value Range Status Comment   Specimen Description TRACHEAL ASPIRATE   Final    Special Requests NONE   Final    Gram Stain     Final    Value: ABUNDANT WBC PRESENT, PREDOMINANTLY PMN     NO SQUAMOUS EPITHELIAL CELLS SEEN     RARE GRAM POSITIVE COCCI IN PAIRS   Culture NO GROWTH 2 DAYS   Final    Report Status 07/23/2012 FINAL   Final   RESPIRATORY VIRUS PANEL     Status: Abnormal   Collection Time   07/22/12  3:07 PM      Component Value Range Status Comment   Source - RVPAN TRACHEAL ASPIRATE   Final    Respiratory Syncytial Virus A NOT DETECTED   Final    Respiratory Syncytial Virus B NOT DETECTED   Final  Influenza A DETECTED (*)  Final    Influenza B NOT DETECTED   Final    Parainfluenza 1 NOT DETECTED   Final    Parainfluenza 2 NOT DETECTED   Final    Parainfluenza 3 NOT DETECTED   Final    Metapneumovirus NOT DETECTED   Final    Rhinovirus NOT DETECTED   Final    Adenovirus NOT DETECTED   Final    Influenza A H1 NOT DETECTED   Final    Influenza A H3 NOT DETECTED   Final   CULTURE, BLOOD (ROUTINE X 2)     Status: Normal (Preliminary result)   Collection Time   07/27/12 10:05 PM      Component Value Range Status Comment   Specimen Description BLOOD RIGHT HAND   Final    Special Requests BOTTLES DRAWN AEROBIC ONLY 4CC   Final    Culture  Setup Time 07/28/2012 15:02   Final    Culture     Final    Value:        BLOOD CULTURE RECEIVED NO GROWTH TO DATE CULTURE WILL BE HELD FOR 5 DAYS BEFORE ISSUING A FINAL NEGATIVE REPORT   Report Status PENDING   Incomplete   CULTURE, BLOOD (ROUTINE X 2)     Status: Normal (Preliminary result)   Collection Time   07/28/12 12:01 AM      Component Value Range Status Comment   Specimen Description BLOOD RIGHT HAND   Final    Special Requests BOTTLES  DRAWN AEROBIC ONLY 6CC   Final    Culture  Setup Time 07/28/2012 15:02   Final    Culture     Final    Value:        BLOOD CULTURE RECEIVED NO GROWTH TO DATE CULTURE WILL BE HELD FOR 5 DAYS BEFORE ISSUING A FINAL NEGATIVE REPORT   Report Status PENDING   Incomplete      Assessment: Patient is a 72 yoM currently on vancomycin for S.pneumo bacteremia. Blood cultures at [facility] positive for Strep pneumo - pan sensitive including vancomycin. Blood cultures at [facility] have no growth to date. Antibiotic therapy started on 1/18 and has included cefepime, Levaquin and vancomycin. Current plan is to continue therapy for a total of 14 days.   On 1/27, antibiotic regimen narrowed to vancomycin for remaining length of therapy.    WBC 19.1- slight increase, Afebrile today with tmax 100.1.  SCr 1.91 today - improving  Urine output is good at 1.54ml/kg/min  With improving renal function, will increase current vancomycin dose to ensure therapeutic concentrations.   Goal of Therapy:  Vancomycin trough level 15-20 mcg/ml  Plan:  1. Increase Vancomycin to 1250 mg IV q24h 2. Follow up with CCM concerning total length of therapy (pending further evaluation of lung CT results) 3. F/u trough at steady state if length of therapy extends beyond original 14 day plan.  2. Continue to follow renal function 3. Continue to monitor WBC, temperature and clinical status.  Allene Dillon, PharmD Candidate 07/30/2012  9:21 AM  I have reviewed patient's clinical status and agree with above assessment and plan. Will recheck VT at [facility] if therapy continued for longer than 14 days.  Sloan Leiter, PharmD, BCPS Clinical Pharmacist [phone] 07/30/2012, 12:11 PM

## 2012-07-30 NOTE — Progress Notes (Signed)
Inpatient Diabetes Program Recommendations  AACE/ADA: New Consensus Statement on Inpatient Glycemic Control (2013)  Target Ranges:  Prepandial:   less than 140 mg/dL      Peak postprandial:   less than 180 mg/dL (1-2 hours)      Critically ill patients:  140 - 180 mg/dL   Reason for Visit: Results for [name], [name] (MRN [id number]) as of 07/30/2012 10:34  Ref. Range 07/29/2012 20:09 07/29/2012 23:50 07/30/2012 03:46 07/30/2012 07:54  Glucose-Capillary Latest Range: 70-99 mg/dL 228 (H) 218 (H) 201 (H) 217 (H)   Note ICU Hyperglycemic protocol discontinued today.  Please add Novolog tube feed coverage 4 units q 4 hours to keep CBG's less than 180 mg/dL.

## 2012-07-30 NOTE — Progress Notes (Signed)
CRITICAL VALUE ALERT  Critical value received:  Na 161, CO2 42  Date of notification:  07/30/12  Time of notification:  [id number]  Critical value read back:yes  Nurse who received alert:  K,Hunt  MD notified (1st page):  Dr. Newt Lukes  Time of first page:  0455  MD notified (2nd page):  Time of second page:  Responding MD:  Dr. Newt Lukes  Time MD responded:  [phone]

## 2012-07-30 NOTE — Progress Notes (Signed)
NUTRITION FOLLOW UP  Intervention:    Continue Oxepa at 30 ml/h with Prostat 60 ml TID to provide 1680 kcals (25 kcals/kg ideal weight), 135 gm protein, 565 ml free water daily.  Continue liquid MVI via tube daily to meet 100% DRI's.  Nutrition Dx:   Inadequate oral intake related to inability to eat as evidenced by NPO status, ongoing.  Goal:   Enteral nutrition to provide 60-70% of estimated calorie needs (22-25 kcals/kg ideal body weight) and >/= 90% of estimated protein needs, based on ASPEN guidelines for permissive underfeeding in critically ill obese individuals, met.  Monitor:   TF tolerance/adequacy, weight trend, labs, vent status.  Assessment:   Patient with sepsis related to multilobar PNA, H1N1 infection, and severe ARDS.  Noted possibility for trach this week.  Free water flushes increased to 400 ml every 4 hours due to hypernatremia.  Patient remains intubated on ventilator support.  MV: 10.6 Temp:Temp (24hrs), Avg:99.4 F (37.4 C), Min:98.6 F (37 C), Max:100 F (37.8 C)  Tolerating TF well via OG tube.    Height: Ht Readings from Last 1 Encounters:  07/20/12 5\' 7"  (1.702 m)    Weight Status:   Wt Readings from Last 1 Encounters:  07/28/12 231 lb 7.7 oz (105 kg)  07/22/12  242 lb 8.1 oz (110 kg)  Re-estimated needs:  Kcal: 2075 Protein: >134 gm Fluid: 2.1-2.2 L  Skin: no problems noted  Diet Order:  Oxepa at 30 ml/h with Prostat 60 ml TID providing 1680 kcals (25 kcals/kg ideal weight), 135 gm protein, 565 ml free water daily.  Free water flushes:  400 ml every 4 hours  Intake/Output Summary (Last 24 hours) at 07/30/12 1155 Last data filed at 07/30/12 1100  Gross per 24 hour  Intake 4578.96 ml  Output   3615 ml  Net 963.96 ml    Last BM: 1/28   Labs:   Lab 07/30/12 0346 07/29/12 1030 07/29/12 0430 07/28/12 0540  NA 161* 162* 161* --  K 3.2* 3.4* 2.7* --  CL 115* 115* 113* --  CO2 42* 40* 41* --  BUN 167* 180* 182* --  CREATININE  1.91* 2.22* 2.28* --  CALCIUM 9.1 9.2 9.1 --  MG 2.2 -- 2.1 2.1  PHOS 4.6 -- 6.2* 6.5*  GLUCOSE 212* 222* 185* --    CBG (last 3)   Basename 07/30/12 0754 07/30/12 0346 07/29/12 2350  GLUCAP 217* 201* 218*    Scheduled Meds:    . antiseptic oral rinse  15 mL Mouth Rinse QID  . aspirin  81 mg Per Tube Daily  . chlorhexidine  15 mL Mouth Rinse BID  . feeding supplement (OXEPA)  1,000 mL Per Tube Q24H  . feeding supplement  60 mL Per Tube TID  . free water  400 mL Per Tube Q4H  . heparin  5,000 Units Subcutaneous Q8H  . insulin aspart  0-20 Units Subcutaneous Q4H  . insulin glargine  20 Units Subcutaneous Q24H  . multivitamin  5 mL Per Tube Daily  . pantoprazole sodium  40 mg Per Tube Daily  . vancomycin  1,250 mg Intravenous Q24H    Continuous Infusions:    . dextrose 100 mL/hr at 07/30/12 0842  . fentaNYL infusion INTRAVENOUS 100 mcg/hr (07/30/12 0223)  . norepinephrine (LEVOPHED) Adult infusion Stopped (07/28/12 1400)    Molli Barrows, RD, LDN, CNSC Pager# [phone] After Hours Pager# [phone]

## 2012-07-30 NOTE — Progress Notes (Signed)
CRITICAL CARE RESIDENT NOTE Interim Progress Note  Called by RN regarding the following results, with the interventions as listed below:   RESULT INTERVENTION TAKEN  1. Na 161 Continue to monitor   2. CO2 42 Continue to monitor   3. K 3.2 Will replete with KCL 40 meq x2     Signed: Rosalia Hammers, MD  PGY-III, Internal Medicine Resident 07/30/2012, 4:56 AM

## 2012-07-30 NOTE — Care Management Note (Addendum)
    Page 1 of 2   08/07/2012     11:55:20 AM   CARE MANAGEMENT NOTE 08/07/2012  Patient:  [name], [name]   Account Number:  [id number]  Date Initiated:  07/22/2012  Documentation initiated by:  Elissa Hefty  Subjective/Objective Assessment:   adm w resp failure, vent     Action/Plan:   pcp dr Murvin Donning   Anticipated DC Date:  07/30/2012   Anticipated DC Plan:  [place]  CM consult      Choice offered to / List presented to:             Status of service:  In process, will continue to follow Medicare Important Message given?   (If response is "NO", the following Medicare IM given date fields will be blank) Date Medicare IM given:   Date Additional Medicare IM given:    Discharge Disposition:    Per UR Regulation:  Reviewed for med. necessity/level of care/duration of stay  If discussed at [place] of Stay Meetings, dates discussed:   08/01/2012    Comments:  Contact:  Dahlstrom,Peggy Spouse [phone]   [phone]  08-07-12   11:45am Luz Lex, RNBSN [phone] Looks better today - trying to open eyes - following some commands - fentanyl off.  Paperwork sent in to insurance - has meet requrirement of trach for 7 days and 3 failed weans.  Waiting for approval.  Again talked with wife and daughters about [facility].  Do not want [facility] unless only choice, looking into [facility] in [place] to see if in network - awaiting call back for [employer].  Cannot afford out of pocket payments.  Suggested any concerns questions they have about [facility] talking with the laison, Ronalee Belts here at [facility].  CM will continue to follow.  08-01-12 10am [place] Wife and daughter at bedside.  Touched base with both - answered any questions - plan for CT and Trach today.  07-31-12 2:15pm Luz Lex, RNBSNn [phone] Talked with wife, [name].  Updated on progression options. Rehab facility - agree will need - options  - [facility] - [facility] in network, once trached has to be documented 3 failed attempts at wean and greater than 7 days trached to be considered.  If off vent inhouse rehab and out of facility - SNF may be rehab options, depending on progression.  Appreciated update and will let rest of family know.  CM will continue to follow with updates on options.  07-29-12 12:15pm Luz Lex, RNBSN - trach tomorrow - probable [facility] - Referral made for [facility] insurance check only at this time.  07-23-12 2:35pm Luz Lex, [place] [phone] Remains intubated.  Family discussions about code status and plan of care started with wife.  Family meeting planned for end of week for decisions.  1/20 1123 debbie dowell rn,bsn

## 2012-07-30 NOTE — Progress Notes (Signed)
CRITICAL VALUE ALERT  Critical value received:  Na 162, CO2 41  Date of notification:  07/30/12  Time of notification:  1025  Critical value read back:yes  Nurse who received alert:  Elliot Gurney  MD notified (1st page):  Dr. Newt Lukes  Time of first page:  2225  MD notified (2nd page):  Time of second page:  Responding MD:  Dr. Newt Lukes  Time MD responded:  2227

## 2012-07-30 NOTE — Progress Notes (Signed)
PULMONARY  / CRITICAL CARE MEDICINE  Name: [name] MRN: [id number] DOB: 25-Feb-1941    LOS: 73  REFERRING MD :  [name]  CHIEF COMPLAINT:  Respiratory failure and pneumonia  BRIEF PATIENT DESCRIPTION: [name] with HTN, CHF admitted to [facility] on 1/18 on transfer from [facility] ER with sepsis and ARDS secondary to influenza A/H1N1 multifocal pneumonia.   LINES / TUBES: L. CVL 1/18 >>  ETT 1/18 >>  CULTURES: [facility] [phone]): 1/18 Blood culture x 2 >> strep pneumoniae in 2 of 2 pansenstive   [place]: 1/18 BC >> NG 1/18 UC >> negative 1/18 Flu PCR >> Influenza A/ H1N1 1/18 lactate >> 2.3  1/19 PCT >> 8 >> 6.5 1/19 Resp: rare GPC in pairs. No growth at 2 days.   1/25 BC x2: NGTD  ANTIBIOTICS: 1/18 Vanc >> 1/23 restarted 1/25 >>> 1/18 Levaquin >> 1/23 1/18 Tamiflu >> planned end date 1/28 1/18 Cefepime >>1/23 1/23 Rocephin 1/23>>> 1/23 1/24 Levofloxacin >>> 1/27  SIGNIFICANT EVENTS / studies:  1/18 Transfer to [facility] from [place] for respiratory failure started on Nimbex protocol for SEVERE ARDS.  1/27 ct chest- free flowing rt effusion small, partial loculated effusion left small  LEVEL OF CARE:  ICU PRIMARY SERVICE:  PCCM CONSULTANTS:  None CODE STATUS: Full DIET:  Tube feeds DVT Px:  Heparin SQ GI Px:  Protonix  HISTORY OF PRESENT ILLNESS:  72 yo male with known hx of HTN, CHF presented to [facility] ER on 1/18 w/ 1 week hx of cough , congestion w/ progressively worsening dyspnea . Hypoxia in ER , decompensated requiring intubation . CXR w/ multilobar PNA . Hypotension unresponsive to fluid challenged requiring pressors. CCM asked to accept at [facility] ICU 1/18. Transported via [facility] . Renal failure w/ scr ~4 . Received 6L + in ER.   SUBJECTIVE / INTERVAL HISTORY:  Intubated and sedated.  Fever resolving overnight.  Holding oxygenation well with 60% FiO2 and 10 of PEEP.  Creatinine, BUN, Phos and UOP without lasix was 3.6L.    VITAL SIGNS: Temp:   [99.1 F (37.3 C)-100.1 F (37.8 C)] 99.4 F (37.4 C) (01/28 0200) Pulse Rate:  [87-108] 91  (01/28 0700) Resp:  [22-34] 22  (01/28 0700) BP: (95-112)/(56-72) 104/68 mmHg (01/28 0700) SpO2:  [86 %-94 %] 94 % (01/28 0700) FiO2 (%):  [59.8 %-60.3 %] 60 % (01/28 0600) HEMODYNAMICS: CVP:  [10 mmHg-29 mmHg] 29 mmHg VENTILATOR SETTINGS: Vent Mode:  [-] PRVC FiO2 (%):  [59.8 %-60.3 %] 60 % Set Rate:  [24 bmp-35 bmp] 24 bmp Vt Set:  [400 mL] 400 mL PEEP:  [10 cmH20] 10 cmH20 Plateau Pressure:  [20 cmH20-27 cmH20] 20 cmH20 INTAKE / OUTPUT: Intake/Output      01/27 0701 - 01/28 0700 01/28 0701 - 01/29 0700   I.V. (mL/kg) 1724 (16.4)    Other 100    NG/GT 2630    IV Piggyback 200    Total Intake(mL/kg) 4654 (44.3)    Urine (mL/kg/hr) 3565 (1.4)    Stool 100    Total Output 3665    Net +989           PHYSICAL EXAMINATION: General: Intubated and sedated  Head: Normocephalic, atraumatic, right eye with corneal scratch healing.   Lungs:  Normal respiratory effort. Mild bronchial breath sounds bilaterally.   Heart: RRR, no MRG.   Abdomen:  BS normal. Soft, nondistended, non-tender.   Extremities: No pretibial edema.  Skin: Confluent rash over the back, lower  abdomen, and dorsal arms.  No induration or skin sloughing. Improved erythema this AM.     IMAGING  1/24 CXR: Unchanged  MEDICATIONS: Infusions    . dextrose 1,000 mL (07/30/12 [id number])  . fentaNYL infusion INTRAVENOUS 100 mcg/hr (07/30/12 0223)  . norepinephrine (LEVOPHED) Adult infusion Stopped (07/28/12 1400)   Scheduled    . antiseptic oral rinse  15 mL Mouth Rinse QID  . aspirin  81 mg Per Tube Daily  . chlorhexidine  15 mL Mouth Rinse BID  . feeding supplement (OXEPA)  1,000 mL Per Tube Q24H  . feeding supplement  60 mL Per Tube TID  . free water  400 mL Per Tube Q4H  . heparin  5,000 Units Subcutaneous Q8H  . hydrocortisone sod succinate (SOLU-CORTEF) injection  25 mg Intravenous Q12H  . insulin aspart  2  Units Subcutaneous Q4H  . insulin aspart  2-6 Units Subcutaneous Q4H  . insulin glargine  15 Units Subcutaneous Q24H  . multivitamin  5 mL Per Tube Daily  . oseltamivir  150 mg Per Tube Daily  . pantoprazole sodium  40 mg Per Tube Daily  . potassium chloride  40 mEq Per Tube Q4H  . vancomycin  1,000 mg Intravenous Q24H   DIAGNOSES: Active Problems:  Acute respiratory failure with hypoxia  ARDS (adult respiratory distress syndrome)  Acute renal failure  ASSESSMENT / PLAN:  PULMONARY  Lab 07/29/12 1427 07/29/12 0425 07/28/12 0415  PHART 7.347* 7.428 7.395  PCO2ART 79.1* 61.1* 65.6*  PO2ART 66.0* 53.0* 63.8*  HCO3 43.2* 39.9* 39.4*  TCO2 46 41.8 41.4   A:  1) Multilobar PNA - respiratory viral panel shows influenza only.  flu PCR positive for H1N1. sputum culture from [place] showed S. Pneumo.  [facility] sputum culture shows GPC in pairs which is likely S. pneumo but there was no growth in the final culture. Treating for flu and S. pneumo. CT of chest yesterday shows a moderate partially loculated left pleural effusion and a small right pleural effusion that appears free flowing.   2) Influenza A/ H1N1 infection: Positive flu PCR and now resp viral panel shows no other viruses.   3) Severe ARDS - Able to maintain off nimbex after a long period on.  Oxygenation stable on FiO2 60% and PEEP 10.   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   P:   - See ID. - completed 10 days of [place]  - Continue to wean per ARDS protocol, reduce MV to 18 then repeat abg -does not appear that we have room to reduce O2 needs - Permissive hypercapnia if needed, not as of now - Limit bronchodilators - Consider trach early this week, thur -see ID for CT evaluation  CARDIOVASCULAR  Lab 07/25/12 1000  CKTOTAL --  CKMB --  TROPONINI 0.64*   A:  1) Sepsis - 2/2 multilobar PNA, influenza A/ H1N1 2) Elevated troponins  - question of demand ischemia vs NSTEMI in setting of sepsis vs renal failure. No new EKG changes. Recheck trop stable with no rise.  With renal failure will hang around longer.  3) AAA - 6.4 cm per CT Abd 1/19.  Will continue to monitor unless developing hypotension or hemodynamic compromise. 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  P:  - Monitor AAA for now, low threshold for vascular consult if worsening hemodynamically will monitor for now - Continue aspirin  81mg  daily. - Spoke with cards nothing planned  RENAL  Lab 07/30/12 0346 07/29/12 1030 07/29/12 0430  NA 161* 162* 161*  K 3.2* 3.4* 2.7*  CL 115* 115* 113*  CO2 42* 40* 41*  BUN 167* 180* 182*  CREATININE 1.91* 2.22* 2.28*  GLUCOSE 212* 222* 185*   A:  1) Acute renal failure  - ATN in setting of hypotension and volume depletion in setting of sepsis, improving currently.  CVP stable 11-13.  UOP brisk without lasix yesterday.  Creatinine improving and BUN finally coming down.  2. Hyperphosphatemia: Improving with resolution of #1 2) Hypokalemia - repleted today 3) Hypernatremia: Na stable at 161 today.  Free water deficit of 6.8L.    P:  - Continue to monitor lytes in am  - Renally dose medications. - Continue tube feeds. - Kcl 40 mEq q4 x3 - recheck Bmet at 1600 - increase free water to 400 q4 and increase D5 to 125 ml/hr to correct, especially with improved renal fxn with volume - Bmet in the AM.   GASTROINTESTINAL No results found for this basename: AST:3,ALT:3,ALKPHOS:3,BILITOT:3,PROT:3,ALBUMIN:3 in the last 168 hours A: No acute issues. Having BM.  P:  - Continue tube feeds - ppi  HEMATOLOGIC  Lab 07/30/12 0346 07/29/12 1225 07/29/12 0430 07/28/12 0540  WBC 19.1* -- 17.9* 14.0*  HGB 9.9* -- 11.1* 10.4*  HCT 33.3* -- 35.7* 33.2*  PLT 145* -- 161 179  APTT -- 35 -- --  INR -- 1.38 -- --   A:  1) Acute normocytic anemia - likely related to critical illness, unknown baseline. FOBT  positive yesterday.  HgB stable.  INR stable  2) Thrombocytopenia - unknown baseline, low normal in 05/2008. No obvious source of bleed. Likely related to critical illness. Stable.  3) hemoconcetration P:  - Continue to monitor CBC with pos balance, for dilution, only mild changes as of now - sub q hep  INFECTIOUS  Lab 07/30/12 0346 07/29/12 0430 07/28/12 0540 07/24/12 0427  WBC 19.1* 17.9* 14.0* --  NEUTROABS -- -- -- 12.7*  LATICACIDVEN -- -- -- --  PROCALCITON -- -- -- --   A:  1) Sepsis secondary to multilobar PNA and influenza 2) S. Pneumonia bacteremia - 2/2 blood cultures positive with S. Pneumo, pan sensitive from [place].  BC negative here at [facility]. Resp culture from [facility] showing [place] in pairs but no growth on final culture. Narrowed to Levaquin for 14 days with continued rash stopped Levaquin yesterday and continued on only Vanc.  Today (1/28) is day 11 of antibiotics.  3. Drug rash: likely secondary to Cephalosporins.  Changed to Levaquin 1/24 for the rest of the course. Also question possibility fever yesterday a drug fever.  4. Leukocytosis - continues.  Afebrile last night.  CT of chest shows partially loculated effusion in the left lung.  Will likely need to tap today given risk for empyema.   P:  - Cortisol of 33, stop stress dose steroids today.  - Vanc currently given sensitivities from blood culture from [facility] 11/14 days.  -may need to restart levofloxacin given vanc time to kill as monotherapy with an organism that is pansens - completedTamiflu day 10/10. - consider thoracocentesis today for possible empyema, will [place] it at bedside, may need Ct guidance, he is NOT on pressors and not having major fevers - likely need to sample this   ENDOCRINE / RHEUMATOLOGIC  Lab 07/30/12 0346 07/29/12 2350 07/29/12 2009 07/29/12 1633 07/29/12 1220  GLUCAP 201*  218* 228* 220* 237*   A: 1) Prediabetes - blood sugars stable elevated with increased D5 drop.   P:  - ICU  hyperglycemia protocol.  - increase Lantus to 20 units  NEUROLOGIC / PSYCHIATRIC RAAS Goal: -3 CAM-ICU: Cannot assess     A: 1) Acute encephalopathy - in setting of sepsis vs now hypernatremia vs Uremia. Hypernatremia not rising finally and BUN starting to fall with resolution of ATN.  P:  - Continue with RAAS goal of -3, fent only - intermittent Versed -add Risperdal if agitation worsens -ct head neg  Wife updated  CLINICAL SUMMARY: 67 yoM with HTN, CHF admitted to [facility] MICU on transfer from [facility] on 07/20/2012 secondary to sepsis in setting  of influenza A/ H1N1 / multilobar PNA. Intubated in ED secondary to hypoxic respiratory failure. Has ARDS on the protocol nimbex 1/19 to 1/24.  Started empirically on broad spectrum ABx on admission and narrowed to Vanc for 14 days of total treatment.  Likely drug fever with Cephalosporins, improving.  CT of chest showed partially loculated effusion on the left, may need to tap today. Reduce rate on vent  Trish Fountain, MD Internal Medicine Resident, PGY III Co-Chief Resident, Internal Medicine Pager: [phone] 07/30/2012 7:18 AM   Ccm time 35 min   I have fully examined this patient and agree with above findings.    And edited in full  Lavon Paganini. Titus Mould, MD, [place] Pgr: [facility]

## 2012-07-31 ENCOUNTER — Inpatient Hospital Stay (HOSPITAL_COMMUNITY): Payer: BC Managed Care – PPO

## 2012-07-31 LAB — HEPATIC FUNCTION PANEL
ALT: 56 U/L — ABNORMAL HIGH (ref 0–53)
AST: 42 U/L — ABNORMAL HIGH (ref 0–37)
Albumin: 1.6 g/dL — ABNORMAL LOW (ref 3.5–5.2)
Alkaline Phosphatase: 59 U/L (ref 39–117)
Total Bilirubin: 0.4 mg/dL (ref 0.3–1.2)

## 2012-07-31 LAB — BODY FLUID CELL COUNT WITH DIFFERENTIAL
Eos, Fluid: 0 %
Lymphs, Fluid: 4 %
Monocyte-Macrophage-Serous Fluid: 6 % — ABNORMAL LOW (ref 50–90)
Total Nucleated Cell Count, Fluid: 2600 cu mm — ABNORMAL HIGH (ref 0–1000)

## 2012-07-31 LAB — GLUCOSE, CAPILLARY
Glucose-Capillary: 150 mg/dL — ABNORMAL HIGH (ref 70–99)
Glucose-Capillary: 169 mg/dL — ABNORMAL HIGH (ref 70–99)
Glucose-Capillary: 190 mg/dL — ABNORMAL HIGH (ref 70–99)

## 2012-07-31 LAB — BASIC METABOLIC PANEL
BUN: 118 mg/dL — ABNORMAL HIGH (ref 6–23)
CO2: 38 mEq/L — ABNORMAL HIGH (ref 19–32)
Chloride: 112 mEq/L (ref 96–112)
Chloride: 112 mEq/L (ref 96–112)
Creatinine, Ser: 1.46 mg/dL — ABNORMAL HIGH (ref 0.50–1.35)
Creatinine, Ser: 1.51 mg/dL — ABNORMAL HIGH (ref 0.50–1.35)
GFR calc Af Amer: 54 mL/min — ABNORMAL LOW (ref >90)
GFR calc Af Amer: 52 mL/min — ABNORMAL LOW (ref >90)
GFR calc non Af Amer: 47 mL/min — ABNORMAL LOW (ref >90)
Potassium: 3.5 mEq/L (ref 3.5–5.1)
Potassium: 4.4 mEq/L (ref 3.5–5.1)

## 2012-07-31 LAB — POCT I-STAT 3, ART BLOOD GAS (G3+)
O2 Saturation: 85 %
Patient temperature: 99.6
TCO2: 44 mmol/L (ref 0–100)
pH, Arterial: 7.406 (ref 7.350–7.450)

## 2012-07-31 LAB — MAGNESIUM: Magnesium: 2 mg/dL (ref 1.5–2.5)

## 2012-07-31 LAB — CBC
HCT: 32.7 % — ABNORMAL LOW (ref 39.0–52.0)
Hemoglobin: 9.6 g/dL — ABNORMAL LOW (ref 13.0–17.0)
RDW: 15.5 % (ref 11.5–15.5)
WBC: 14.8 10*3/uL — ABNORMAL HIGH (ref 4.0–10.5)

## 2012-07-31 LAB — PHOSPHORUS: Phosphorus: 3.5 mg/dL (ref 2.3–4.6)

## 2012-07-31 LAB — PROTEIN, BODY FLUID: Total protein, fluid: 3 g/dL

## 2012-07-31 MED ORDER — POTASSIUM CHLORIDE 20 MEQ/15ML (10%) PO LIQD
ORAL | Status: AC
  Administered 2012-07-31: 40 meq
  Filled 2012-07-31: qty 30

## 2012-07-31 MED ORDER — VECURONIUM BROMIDE 10 MG IV SOLR
10.0000 mg | Freq: Once | INTRAVENOUS | Status: AC
  Administered 2012-08-01: 8 mg via INTRAVENOUS
  Filled 2012-07-31: qty 10

## 2012-07-31 MED ORDER — MIDAZOLAM HCL 2 MG/2ML IJ SOLN
4.0000 mg | Freq: Once | INTRAMUSCULAR | Status: AC
  Administered 2012-08-01: 4 mg via INTRAVENOUS
  Filled 2012-07-31: qty 4

## 2012-07-31 MED ORDER — PROPOFOL 10 MG/ML IV EMUL: 5.0000 ug/kg/min | Freq: Once | INTRAVENOUS | Status: DC

## 2012-07-31 MED ORDER — POTASSIUM CHLORIDE 20 MEQ/15ML (10%) PO LIQD
40.0000 meq | ORAL | Status: AC
  Administered 2012-07-31 (×2): 40 meq
  Filled 2012-07-31 (×2): qty 30

## 2012-07-31 MED ORDER — ETOMIDATE 2 MG/ML IV SOLN
40.0000 mg | Freq: Once | INTRAVENOUS | Status: DC
  Administered 2012-08-01: 20 mg via INTRAVENOUS
  Filled 2012-07-31: qty 20

## 2012-07-31 MED ORDER — ETOMIDATE 2 MG/ML IV SOLN
40.0000 mg | Freq: Once | INTRAVENOUS | Status: AC
  Administered 2012-08-01: 20 mg via INTRAVENOUS
  Filled 2012-07-31: qty 20

## 2012-07-31 MED ORDER — LIDOCAINE HCL (PF) 1 % IJ SOLN
INTRAMUSCULAR | Status: AC
  Filled 2012-07-31: qty 5

## 2012-07-31 MED ORDER — INSULIN GLARGINE 100 UNIT/ML ~~LOC~~ SOLN
25.0000 [IU] | SUBCUTANEOUS | Status: DC
Start: 2012-07-31 — End: 2012-08-02
  Administered 2012-07-31 – 2012-08-01 (×2): 25 [IU] via SUBCUTANEOUS

## 2012-07-31 MED ORDER — FENTANYL CITRATE 0.05 MG/ML IJ SOLN: 200.0000 ug | Freq: Once | INTRAMUSCULAR | Status: DC

## 2012-07-31 MED ORDER — FENTANYL CITRATE 0.05 MG/ML IJ SOLN
200.0000 ug | Freq: Once | INTRAMUSCULAR | Status: AC
  Administered 2012-08-01: 200 ug via INTRAVENOUS

## 2012-07-31 MED ORDER — MIDAZOLAM HCL 2 MG/2ML IJ SOLN
4.0000 mg | Freq: Once | INTRAMUSCULAR | Status: DC
Start: 2012-07-31 — End: 2012-07-31

## 2012-07-31 MED ORDER — VECURONIUM BROMIDE 10 MG IV SOLR
10.0000 mg | Freq: Once | INTRAVENOUS | Status: DC
  Filled 2012-07-31: qty 10

## 2012-07-31 NOTE — Progress Notes (Signed)
Patient ID: [name], male   DOB: [birth year], 72 y.o.   MRN: [id number]   Aware of request for Perc G tube placement Vent dependent malnutrition  T: 100 today WBC 14.8  Will continue to watch labs and temp When wnl will move forward with preparation for perc G tube

## 2012-07-31 NOTE — Progress Notes (Addendum)
PULMONARY  / CRITICAL CARE MEDICINE  Name: [name] MRN: [id number] DOB: [birth year]    LOS: 54  REFERRING MD :  [name]  CHIEF COMPLAINT:  Respiratory failure and pneumonia  BRIEF PATIENT DESCRIPTION: 19 yoM with HTN, CHF admitted to [facility] on 1/18 on transfer from [facility] ER with sepsis and ARDS secondary to influenza A/H1N1 multifocal pneumonia.   LINES / TUBES: L. CVL 1/18 >>  ETT 1/18 >>  CULTURES: [facility] [phone]): 1/18 Blood culture x 2 >> strep pneumoniae in 2 of 2 pansenstive   [place]: 1/18 BC >> NG 1/18 UC >> negative 1/18 Flu PCR >> Influenza A/ H1N1 1/18 lactate >> 2.3  1/19 PCT >> 8 >> 6.5 1/19 Resp: rare GPC in pairs. No growth at 2 days.   1/25 BC x2: NGTD  ANTIBIOTICS: 1/18 Vanc >> 1/23 restarted 1/25 >>> 1/18 Levaquin >> 1/23 1/18 Tamiflu >> planned end date 1/28 1/18 Cefepime >>1/23 1/23 Rocephin 1/23>>> 1/23 1/24 Levofloxacin >>> 1/27  SIGNIFICANT EVENTS / studies:  1/18 Transfer to [facility] from [place] for respiratory failure started on Nimbex protocol for SEVERE ARDS.  1/27 ct chest- free flowing rt effusion small, partial loculated effusion left small  LEVEL OF CARE:  ICU PRIMARY SERVICE:  PCCM CONSULTANTS:  None CODE STATUS: Full DIET:  Tube feeds DVT Px:  Heparin SQ GI Px:  Protonix  HISTORY OF PRESENT ILLNESS:  72 yo male with known hx of HTN, CHF presented to [facility] ER on 1/18 w/ 1 week hx of cough , congestion w/ progressively worsening dyspnea . Hypoxia in ER , decompensated requiring intubation . CXR w/ multilobar PNA . Hypotension unresponsive to fluid challenged requiring pressors. CCM asked to accept at [facility] ICU 1/18. Transported via [facility] . Renal failure w/ scr ~4 . Received 6L + in ER.   SUBJECTIVE / INTERVAL HISTORY:  Intubated and sedated.  More alert this AM.  Right eye with scleral hemorrhage today.  Great UOP yesterday.  Resp rate increased with holding of sedation.   VITAL SIGNS: Temp:  [98.3 F  (36.8 C)-99.9 F (37.7 C)] 99.8 F (37.7 C) (01/29 0400) Pulse Rate:  [90-106] 101  (01/29 0600) Resp:  [24-34] 28  (01/29 0700) BP: (94-124)/(54-74) 106/68 mmHg (01/29 0700) SpO2:  [91 %-99 %] 96 % (01/29 0600) FiO2 (%):  [60 %-60.4 %] 60 % (01/29 0700) Weight:  [231 lb 14.8 oz (105.2 kg)] 231 lb 14.8 oz (105.2 kg) (01/29 0500) HEMODYNAMICS: CVP:  [8 mmHg] 8 mmHg VENTILATOR SETTINGS: Vent Mode:  [-] PRVC FiO2 (%):  [60 %-60.4 %] 60 % Set Rate:  [18 bmp] 18 bmp Vt Set:  [400 mL] 400 mL PEEP:  [10 cmH20] 10 cmH20 Plateau Pressure:  [19 cmH20-23 cmH20] 22 cmH20 INTAKE / OUTPUT: Intake/Output      01/28 0701 - 01/29 0700 01/29 0701 - 01/30 0700   I.V. (mL/kg) 2613.5 (24.8)    Other     NG/GT 2850    IV Piggyback 250    Total Intake(mL/kg) 5713.5 (54.3)    Urine (mL/kg/hr) 3275 (1.3)    Stool 400    Total Output 3675    Net +2038.5           PHYSICAL EXAMINATION: General: Intubated and sedated  Head: Normocephalic, atraumatic, right eye with corneal scratch and scleral hemorrhage.   Lungs:  Normal respiratory effort. Mild bronchial breath sounds bilaterally.   Heart: RRR, no MRG.   Abdomen:  BS normal. Soft, nondistended, non-tender.  Extremities: No pretibial edema.  Skin: Confluent rash over the back, lower abdomen, and dorsal arms.  No induration or skin sloughing. Improved erythema again this AM.     IMAGING  1/24 CXR: Unchanged  MEDICATIONS: Infusions    . dextrose 500 mL (07/31/12 0235)  . fentaNYL infusion INTRAVENOUS 50 mcg/hr (07/30/12 2323)  . norepinephrine (LEVOPHED) Adult infusion Stopped (07/28/12 1400)   Scheduled    . antiseptic oral rinse  15 mL Mouth Rinse QID  . aspirin  81 mg Per Tube Daily  . chlorhexidine  15 mL Mouth Rinse BID  . feeding supplement (OXEPA)  1,000 mL Per Tube Q24H  . feeding supplement  60 mL Per Tube TID  . free water  400 mL Per Tube Q4H  . heparin  5,000 Units Subcutaneous Q8H  . insulin aspart  0-20 Units  Subcutaneous Q4H  . insulin glargine  20 Units Subcutaneous Q24H  . multivitamin  5 mL Per Tube Daily  . pantoprazole sodium  40 mg Per Tube Daily  . vancomycin  1,250 mg Intravenous Q24H   DIAGNOSES: Active Problems:  Acute respiratory failure with hypoxia  ARDS (adult respiratory distress syndrome)  Acute renal failure  ASSESSMENT / PLAN:  PULMONARY  Lab 07/30/12 0801 07/29/12 1427 07/29/12 0425  PHART 7.415 7.347* 7.428  PCO2ART 68.5* 79.1* 61.1*  PO2ART 60.0* 66.0* 53.0*  HCO3 43.9* 43.2* 39.9*  TCO2 46 46 41.8   A:  1) Multilobar PNA - respiratory viral panel shows influenza only.  flu PCR positive for H1N1. sputum culture from [place] showed S. Pneumo.  [facility] sputum culture shows GPC in pairs which is likely S. pneumo but there was no growth in the final culture. Treating for flu and S. pneumo. CT of chest yesterday shows a moderate partially loculated left pleural effusion and a small right pleural effusion that appears free flowing.   2) Influenza A/ H1N1 infection: Positive flu PCR and now resp viral panel shows no other viruses.   3) Severe ARDS - Able to maintain off nimbex after a long period on.  Oxygenation stable on FiO2 60% and PEEP 10.   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   P:   - See ID. - completed 10 days of [place]  - Continue to wean per ARDS protocol - does not appear that we have room to reduce O2 needs, peep fio2 remain - Permissive hypercapnia not needed, plat wnl - Limit bronchodilators - Consider trach Thursday afternoon unlikely to improve fio2/peep needs furtehr for trach - see ID for CT evaluation  CARDIOVASCULAR  Lab 07/25/12 1000  CKTOTAL --  CKMB --  TROPONINI 0.64*   A:  1) Sepsis - 2/2 multilobar PNA, influenza A/ H1N1 2) Elevated troponins - question of demand ischemia vs NSTEMI , no further therapy recommedned 3) AAA - 6.4 cm per CT Abd  1/19.  Will continue to monitor unless developing hypotension or hemodynamic compromise. 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  P:  - Monitor AAA for now, low threshold for vascular consult if worsening hemodynamically will monitor for now - Continue aspirin 81mg  daily. Levo started, , likely hypovolemic  RENAL  Lab 07/31/12 0400 07/30/12 2115 07/30/12 0346  NA 157* 162* 161*  K 3.5 3.6 3.2*  CL 112 116* 115*  CO2 42* 41* 42*  BUN 130* 143* 167*  CREATININE 1.46* 1.60*  1.91*  GLUCOSE 219* 189* 212*   A:  1) Acute renal failure  - ATN in setting of hypotension and volume depletion in setting of sepsis, improving currently.  CVP stable 11-13.  UOP brisk without lasix yesterday again.  Creatinine improving and BUN down to 130 today.   2. Hyperphosphatemia: Improving with resolution of #1 2) Hypokalemia - repleted today 3) Hypernatremia: Na stable at 157 this AM.   Large free water deficit.    P:  - Continue to monitor lytes daily  - Renally dose medications. - Continue tube feeds. - Kcl 40 mEq q4 x2 today.  - recheck Bmet at 1600 - free water to 400 q4 and D5 to 125 ml/hr  - Bmet in the AM.  Acetazolamide would harm weaning, has become a pulm criple over time  GASTROINTESTINAL  A: No acute issues. Having BM.  P:  - Continue tube feeds - ppi  HEMATOLOGIC  Lab 07/31/12 0400 07/30/12 0346 07/29/12 1225 07/29/12 0430  WBC 14.8* 19.1* -- 17.9*  HGB 9.6* 9.9* -- 11.1*  HCT 32.7* 33.3* -- 35.7*  PLT 130* 145* -- 161  APTT -- -- 35 --  INR -- -- 1.38 --   A:  1) Acute normocytic anemia - likely related to critical illness, unknown baseline. FOBT positive yesterday.  HgB stable.  INR stable  2) Thrombocytopenia - unknown baseline, low normal in 05/2008. No obvious source of bleed. Likely related to critical illness. Stable.  3) hemoconcetration P:  - Continue to monitor CBC with pos balance, for dilution, only mild changes as of now - sub q  hep coags reviewed, ok for trach  INFECTIOUS  Lab 07/31/12 0400 07/30/12 0346 07/29/12 0430  WBC 14.8* 19.1* 17.9*  NEUTROABS -- -- --  LATICACIDVEN -- -- --  PROCALCITON -- -- --   A:  1) Sepsis secondary to multilobar PNA and influenza 2) S. Pneumonia bacteremia - 2/2 blood cultures positive with S. Pneumo, pan sensitive from [place].  BC negative [facility] at [facility]. Resp culture from [facility] showing [place] in pairs but no growth on final culture. Narrowed to Levaquin for 14 days with continued rash stopped Levaquin yesterday and continued on only Vanc.  Today (1/29) is day 12 of antibiotics.  3. Drug rash with fever: likely secondary to Cephalosporins.  Changed to Levaquin 1/24 and then Vanc on 1/27 for planned 14 days.  4. Leukocytosis - continues.  Afebrile last night.  CT of chest shows partially loculated effusion in the left lung.  Will likely need to tap today given risk for empyema.   P:  - Cortisol of 33, stress steroids stopped.   - Vanc currently given sensitivities from blood culture from [facility] 12/14 days.  - may need to restart levofloxacin - completedTamiflu day 10/10. - consider thoracocentesis today for possible empyema likely diagnostic tap today. Will assess with [place]  ENDOCRINE / RHEUMATOLOGIC  Lab 07/31/12 0408 07/31/12 0009 07/30/12 2017 07/30/12 1552 07/30/12 1158  GLUCAP 169* 190* 181* 173* 216*   A: 1) Prediabetes - blood sugars stable elevated with increased D5 drip.  Better controlled today but still getting significant SSI so will increased lantus again.  P:  - ICU hyperglycemia protocol.  - increase Lantus to 25 units In goal 140-180  NEUROLOGIC / PSYCHIATRIC RAAS Goal: -3 CAM-ICU: Cannot assess     A: 1) Acute encephalopathy - in setting of sepsis vs now hypernatremia vs Uremia. Hypernatremia not rising finally and BUN starting to fall with  resolution of ATN.  2. 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  Scleral hemorrhage today so may need  to consult ophtho today.  P:  - Continue with RAAS goal of -1 to -2, fent only - intermittent Versed -add Risperdal if agitation worsens, seems to become more organized -correct Na -ct head neg  Wife updated  CLINICAL SUMMARY: [name] with HTN, CHF admitted to [facility] MICU on transfer from [facility] on 07/20/2012 secondary to sepsis in setting  of influenza A/ H1N1 / multilobar PNA. Intubated in ED secondary to hypoxic respiratory failure. Has ARDS on the protocol nimbex 1/19 to 1/24.  Started empirically on broad spectrum ABx on admission and narrowed to Vanc for 14 days of total treatment.  Likely drug fever with Cephalosporins, improving.  CT of chest showed partially loculated effusion on the left which we will likely diagnostically tap today.   Trish Fountain, MD Internal Medicine Resident, PGY III Co-Chief Resident, Internal Medicine Pager: [phone] 07/31/2012 7:37 AM   Ccm time 30 min   I have fully examined this patient and agree with above findings.    And edited in full  Lavon Paganini. Titus Mould, MD, [place] Pgr: [facility]

## 2012-07-31 NOTE — Procedures (Signed)
Thoracentesis Procedure Note  Pre-operative Diagnosis: r/o empyema  Post-operative Diagnosis: same  Indications: strep bacteremia, failure to wean, r/o loculated empyema  Procedure Details  Consent: Informed consent was obtained. Risks of the procedure were discussed including: infection, bleeding, pain, pneumothorax. [place] in full  Under sterile conditions the patient was positioned.Chloraprep solution and sterile drapes were utilized.  2% buffered lidocaine was used to anesthetize the 7th rib space. Fluid was obtained without any difficulties and minimal blood loss.  A dressing was applied to the wound and wound care instructions were provided.   Findings 5 ml of cloudy yellow pleural fluid was obtained. A sample was sent to Pathology for cytogenetics, flow, and cell counts, as well as for infection analysis.  Complications:  None; patient tolerated the procedure well.          Condition: stable  Plan A follow up chest x-ray was ordered. Bed Rest for 4 hours. Tylenol 650 mg. for pain.  Attending Attestation: I performed the procedure under US guidance  Lavon Paganini. Titus Mould, MD, [place] Pgr: [place]

## 2012-07-31 NOTE — Procedures (Signed)
Real time [place] left chest  See pictures  1. Overall a free flowing effusion moderate, no fibrinous material 2. Did see tethering of lung with flap  Lavon Paganini. Titus Mould, MD, [place] Pgr: [place] [facility]

## 2012-08-01 ENCOUNTER — Inpatient Hospital Stay (HOSPITAL_COMMUNITY): Payer: BC Managed Care – PPO

## 2012-08-01 ENCOUNTER — Encounter (HOSPITAL_COMMUNITY): Payer: BC Managed Care – PPO

## 2012-08-01 ENCOUNTER — Encounter (HOSPITAL_COMMUNITY): Payer: Self-pay | Admitting: Radiology

## 2012-08-01 DIAGNOSIS — J869 Pyothorax without fistula: Secondary | ICD-10-CM

## 2012-08-01 DIAGNOSIS — R21 Rash and other nonspecific skin eruption: Secondary | ICD-10-CM

## 2012-08-01 LAB — BASIC METABOLIC PANEL
BUN: 119 mg/dL — ABNORMAL HIGH (ref 6–23)
BUN: 111 mg/dL — ABNORMAL HIGH (ref 6–23)
CO2: 38 mEq/L — ABNORMAL HIGH (ref 19–32)
Chloride: 111 mEq/L (ref 96–112)
GFR calc non Af Amer: 41 mL/min — ABNORMAL LOW (ref >90)
Glucose, Bld: 148 mg/dL — ABNORMAL HIGH (ref 70–99)
Glucose, Bld: 222 mg/dL — ABNORMAL HIGH (ref 70–99)
Potassium: 4.4 mEq/L (ref 3.5–5.1)
Potassium: 4.6 mEq/L (ref 3.5–5.1)
Sodium: 154 mEq/L — ABNORMAL HIGH (ref 135–145)

## 2012-08-01 LAB — CBC
HCT: 29.2 % — ABNORMAL LOW (ref 39.0–52.0)
HCT: 27.3 % — ABNORMAL LOW (ref 39.0–52.0)
HCT: 26.9 % — ABNORMAL LOW (ref 39.0–52.0)
Hemoglobin: 8.5 g/dL — ABNORMAL LOW (ref 13.0–17.0)
Hemoglobin: 8 g/dL — ABNORMAL LOW (ref 13.0–17.0)
MCH: 29.3 pg (ref 26.0–34.0)
MCH: 29.2 pg (ref 26.0–34.0)
MCHC: 29.1 g/dL — ABNORMAL LOW (ref 30.0–36.0)
MCV: 99.3 fL (ref 78.0–100.0)
MCV: 97.1 fL (ref 78.0–100.0)
Platelets: 102 10*3/uL — ABNORMAL LOW (ref 150–400)
RBC: 2.9 MIL/uL — ABNORMAL LOW (ref 4.22–5.81)
RBC: 2.75 MIL/uL — ABNORMAL LOW (ref 4.22–5.81)
RBC: 2.77 MIL/uL — ABNORMAL LOW (ref 4.22–5.81)
WBC: 10.6 10*3/uL — ABNORMAL HIGH (ref 4.0–10.5)
WBC: 7.7 10*3/uL (ref 4.0–10.5)

## 2012-08-01 LAB — GLUCOSE, CAPILLARY
Glucose-Capillary: 144 mg/dL — ABNORMAL HIGH (ref 70–99)
Glucose-Capillary: 113 mg/dL — ABNORMAL HIGH (ref 70–99)
Glucose-Capillary: 101 mg/dL — ABNORMAL HIGH (ref 70–99)

## 2012-08-01 LAB — PHOSPHORUS: Phosphorus: 4.4 mg/dL (ref 2.3–4.6)

## 2012-08-01 LAB — BODY FLUID CELL COUNT WITH DIFFERENTIAL
Eos, Fluid: 0 %
Neutrophil Count, Fluid: 97 % — ABNORMAL HIGH (ref 0–25)

## 2012-08-01 LAB — HEMATOCRIT, BODY FLUID

## 2012-08-01 MED ORDER — MIDAZOLAM HCL 2 MG/2ML IJ SOLN
INTRAMUSCULAR | Status: AC
  Administered 2012-08-01: 4 mg via INTRAVENOUS
  Filled 2012-08-01: qty 4

## 2012-08-01 MED ORDER — SODIUM CHLORIDE 0.9 % IV BOLUS (SEPSIS)
500.0000 mL | Freq: Once | INTRAVENOUS | Status: AC
  Administered 2012-08-01: 500 mL via INTRAVENOUS

## 2012-08-01 MED ORDER — RISPERIDONE 1 MG/ML PO SOLN
1.0000 mg | Freq: Two times a day (BID) | ORAL | Status: DC
  Administered 2012-08-01 – 2012-08-06 (×11): 1 mg via ORAL
  Filled 2012-08-01 (×12): qty 1

## 2012-08-01 MED ORDER — VASOPRESSIN 20 UNIT/ML IJ SOLN
0.0300 [IU]/min | INTRAVENOUS | Status: DC
  Administered 2012-08-01 – 2012-08-02 (×2): 0.03 [IU]/min via INTRAVENOUS
  Filled 2012-08-01 (×2): qty 2.5

## 2012-08-01 MED ORDER — MIDAZOLAM HCL 2 MG/2ML IJ SOLN
INTRAMUSCULAR | Status: AC
  Filled 2012-08-01: qty 4

## 2012-08-01 MED ORDER — ETOMIDATE 2 MG/ML IV SOLN
INTRAVENOUS | Status: AC
  Administered 2012-08-01: 20 mg via INTRAVENOUS
  Filled 2012-08-01: qty 10

## 2012-08-01 MED ORDER — HYDROCORTISONE SOD SUCCINATE 100 MG IJ SOLR
50.0000 mg | Freq: Four times a day (QID) | INTRAMUSCULAR | Status: DC
  Administered 2012-08-01 – 2012-08-04 (×12): 50 mg via INTRAVENOUS
  Filled 2012-08-01 (×16): qty 1

## 2012-08-01 MED ORDER — CLINDAMYCIN PHOSPHATE 300 MG/50ML IV SOLN
300.0000 mg | Freq: Three times a day (TID) | INTRAVENOUS | Status: DC
  Administered 2012-08-01 – 2012-08-06 (×16): 300 mg via INTRAVENOUS
  Filled 2012-08-01 (×17): qty 50

## 2012-08-01 NOTE — Procedures (Signed)
Perc trach See full dictation Blood loss 5 cc 8 placed Tolerated well Lavon Paganini. Titus Mould, MD, [place] Pgr: [facility]

## 2012-08-01 NOTE — Progress Notes (Signed)
HPI: KEWAUN FORINASH is an 72 y.o. male with complex hospital course, currently complicated by (L)empyema. Chest tube placed by PCCM, but feels empyema is not completely drained.  Therefore, IR is asked to perform additional Chest tube placement under CT guidance. Also underwent successful tracheostomy today. PMHx and hospital course reviewed TF were restarted at 35/hr but have only been running for about 29min. He did receive some free water flush as well.  Past Medical History:  Past Medical History  Diagnosis Date  . Hypertension   . Hyperlipidemia   . OSA (obstructive sleep apnea)     on cpap  . Prediabetes     Past Surgical History: History reviewed. No pertinent past surgical history.  Family History: History reviewed. No pertinent family history.  Social History:  reports that he has been smoking Cigarettes.  He has been smoking about 2 packs per day. He does not have any smokeless tobacco history on file. He reports that he does not drink alcohol or use illicit drugs.  Allergies:  Allergies  Allergen Reactions  . Cephalosporins Rash    Medications: Medications Prior to Admission  Medication Sig Dispense Refill  . amLODipine (NORVASC) 5 MG tablet Take 5 mg by mouth daily.      Marland Kitchen atorvastatin (LIPITOR) 20 MG tablet Take 20 mg by mouth daily.      Marland Kitchen doxazosin (CARDURA) 8 MG tablet Take 8 mg by mouth daily.      . hydrochlorothiazide (HYDRODIURIL) 25 MG tablet Take 25 mg by mouth daily.      Marland Kitchen ibuprofen (ADVIL,MOTRIN) 200 MG tablet Take 800 mg by mouth every 6 (six) hours as needed. For pain      . losartan (COZAAR) 100 MG tablet Take 100 mg by mouth daily.      . metoprolol succinate (TOPROL-XL) 50 MG 24 hr tablet Take 50 mg by mouth daily. Take with or immediately following a meal.      . niacin (NIASPAN) 500 MG CR tablet Take 500 mg by mouth 2 (two) times daily.      . pantoprazole (PROTONIX) 40 MG tablet Take 40 mg by mouth daily.      . potassium chloride SA  (K-DUR,KLOR-CON) 20 MEQ tablet Take 20 mEq by mouth daily.      Marland Kitchen spironolactone (ALDACTONE) 25 MG tablet Take 25 mg by mouth daily.        Physical Exam: Blood pressure 106/52, pulse 96, temperature 101.8 F (38.8 C), temperature source Rectal, resp. rate 20, height 5\' 7"  (1.702 m), weight 234 lb 5.6 oz (106.3 kg), SpO2 94.00%. Body mass index is 36.70 kg/(m^2).   General Appearance:  Sedated on vent  ENT: NGT in right nare  Neck: Supple, symmetrical, tracheostomy intact, site clean  Lungs:   Diminished (L)BS  Chest Wall:  (L)chest tube intact, large bulky dressing with blood saturation.  Heart:  Regular rate and rhythm, S1, S2 normal, no murmur, rub or gallop. Carotids 2+ without bruit.  Extremities: Extremities normal, atraumatic, no cyanosis or edema  Neurologic: Sedated   Results for orders placed during the hospital encounter of 07/20/12 (from the past 48 hour(s))  GLUCOSE, CAPILLARY     Status: Abnormal   Collection Time   07/30/12  8:17 PM      Component Value Range Comment   Glucose-Capillary 181 (*) 70 - 99 mg/dL   BASIC METABOLIC PANEL     Status: Abnormal   Collection Time   07/30/12  9:15 PM  Component Value Range Comment   Sodium 162 (*) 135 - 145 mEq/L    Potassium 3.6  3.5 - 5.1 mEq/L    Chloride 116 (*) 96 - 112 mEq/L    CO2 41 (*) 19 - 32 mEq/L    Glucose, Bld 189 (*) 70 - 99 mg/dL    BUN 143 (*) 6 - 23 mg/dL    Creatinine, Ser 1.60 (*) 0.50 - 1.35 mg/dL    Calcium 9.0  8.4 - 10.5 mg/dL    GFR calc non Af Amer 42 (*) >90 mL/min    GFR calc Af Amer 49 (*) >90 mL/min   GLUCOSE, CAPILLARY     Status: Abnormal   Collection Time   07/31/12 12:09 AM      Component Value Range Comment   Glucose-Capillary 190 (*) 70 - 99 mg/dL    Comment 1 Documented in Chart      Comment 2 Notify RN     BASIC METABOLIC PANEL     Status: Abnormal   Collection Time   07/31/12  4:00 AM      Component Value Range Comment   Sodium 157 (*) 135 - 145 mEq/L    Potassium 3.5  3.5 -  5.1 mEq/L    Chloride 112  96 - 112 mEq/L    CO2 42 (*) 19 - 32 mEq/L    Glucose, Bld 219 (*) 70 - 99 mg/dL    BUN 130 (*) 6 - 23 mg/dL    Creatinine, Ser 1.46 (*) 0.50 - 1.35 mg/dL    Calcium 9.0  8.4 - 10.5 mg/dL    GFR calc non Af Amer 47 (*) >90 mL/min    GFR calc Af Amer 54 (*) >90 mL/min   CBC     Status: Abnormal   Collection Time   07/31/12  4:00 AM      Component Value Range Comment   WBC 14.8 (*) 4.0 - 10.5 K/uL    RBC 3.28 (*) 4.22 - 5.81 MIL/uL    Hemoglobin 9.6 (*) 13.0 - 17.0 g/dL    HCT 32.7 (*) 39.0 - 52.0 %    MCV 99.7  78.0 - 100.0 fL    MCH 29.3  26.0 - 34.0 pg    MCHC 29.4 (*) 30.0 - 36.0 g/dL    RDW 15.5  11.5 - 15.5 %    Platelets 130 (*) 150 - 400 K/uL   MAGNESIUM     Status: Normal   Collection Time   07/31/12  4:00 AM      Component Value Range Comment   Magnesium 2.0  1.5 - 2.5 mg/dL   PHOSPHORUS     Status: Normal   Collection Time   07/31/12  4:00 AM      Component Value Range Comment   Phosphorus 3.5  2.3 - 4.6 mg/dL   LACTATE DEHYDROGENASE     Status: Normal   Collection Time   07/31/12  4:00 AM      Component Value Range Comment   LDH 201  94 - 250 U/L   GLUCOSE, CAPILLARY     Status: Abnormal   Collection Time   07/31/12  4:08 AM      Component Value Range Comment   Glucose-Capillary 169 (*) 70 - 99 mg/dL    Comment 1 Documented in Chart      Comment 2 Notify RN     POCT I-STAT 3, BLOOD GAS (G3+)     Status: Abnormal  Collection Time   07/31/12  7:57 AM      Component Value Range Comment   pH, Arterial 7.406  7.350 - 7.450    pCO2 arterial 68.0 (*) 35.0 - 45.0 mmHg    pO2, Arterial 54.0 (*) 80.0 - 100.0 mmHg    Bicarbonate 42.5 (*) 20.0 - 24.0 mEq/L    TCO2 44  0 - 100 mmol/L    O2 Saturation 85.0      Acid-Base Excess 16.0 (*) 0.0 - 2.0 mmol/L    Patient temperature 99.6 F      Collection site RADIAL, ALLEN'S TEST ACCEPTABLE      Drawn by RT      Sample type ARTERIAL      Comment NOTIFIED PHYSICIAN     GLUCOSE, CAPILLARY      Status: Abnormal   Collection Time   07/31/12  8:30 AM      Component Value Range Comment   Glucose-Capillary 183 (*) 70 - 99 mg/dL   LACTATE DEHYDROGENASE, BODY FLUID     Status: Abnormal   Collection Time   07/31/12 11:47 AM      Component Value Range Comment   LD, Fluid 6732 (*) 3 - 23 U/L RESULTS CONFIRMED BY MANUAL DILUTION   Fluid Type-FLDH PLEURAL     BODY FLUID CULTURE     Status: Normal (Preliminary result)   Collection Time   07/31/12 11:47 AM      Component Value Range Comment   Specimen Description PLEURAL FLUID LEFT      Special Requests 3.0 ML FLUID      Gram Stain        Value: MODERATE WBC PRESENT,BOTH PMN AND MONONUCLEAR     NO ORGANISMS SEEN   Culture NO GROWTH 1 DAY      Report Status PENDING     PROTEIN, BODY FLUID     Status: Normal   Collection Time   07/31/12 11:47 AM      Component Value Range Comment   Total protein, fluid 3.0   NO NORMAL RANGE ESTABLISHED FOR THIS TEST   Fluid Type-FTP PLEURAL     GLUCOSE, SEROUS FLUID     Status: Normal   Collection Time   07/31/12 11:47 AM      Component Value Range Comment   Glucose, Fluid 142      Fluid Type-FGLU PLEURAL     BODY FLUID CELL COUNT WITH DIFFERENTIAL     Status: Abnormal   Collection Time   07/31/12 11:47 AM      Component Value Range Comment   Fluid Type-FCT PLEURAL      Color, Fluid AMBER (*) YELLOW    Appearance, Fluid CLOUDY (*) CLEAR    WBC, Fluid 2600 (*) 0 - 1000 cu mm    Neutrophil Count, Fluid 90 (*) 0 - 25 %    Lymphs, Fluid 4      Monocyte-Macrophage-Serous Fluid 6 (*) 50 - 90 %    Eos, Fluid 0      Other Cells, Fluid COUNT MAY BE AFFECTED BY PARTICULATE     PH, BODY FLUID     Status: Normal   Collection Time   07/31/12 11:47 AM      Component Value Range Comment   pH, Fluid Type     CORRECTED ON 01/30 AT 0141: PREVIOUSLY REPORTED AS PLEURAL FLUID LEFT CORRECTED ON 01/29 AT 1230: PREVIOUSLY REPORTED AS PLEURAL   Value: CORRECTED ON 01/29 AT 1230: PREVIOUSLY REPORTED AS PLEURAL  pH,  Fluid 9.00     PATHOLOGIST SMEAR REVIEW     Status: Normal   Collection Time   07/31/12 11:47 AM      Component Value Range Comment   Path Review        Value: Abundant PMN's with no organisms seen. Correlate with gram stain and culture.  GLUCOSE, CAPILLARY     Status: Abnormal   Collection Time   07/31/12 12:37 PM      Component Value Range Comment   Glucose-Capillary 194 (*) 70 - 99 mg/dL   HEPATIC FUNCTION PANEL     Status: Abnormal   Collection Time   07/31/12  4:22 PM      Component Value Range Comment   Total Protein 5.6 (*) 6.0 - 8.3 g/dL    Albumin 1.6 (*) 3.5 - 5.2 g/dL    AST 42 (*) 0 - 37 U/L    ALT 56 (*) 0 - 53 U/L    Alkaline Phosphatase 59  39 - 117 U/L    Total Bilirubin 0.4  0.3 - 1.2 mg/dL    Bilirubin, Direct 0.1  0.0 - 0.3 mg/dL    Indirect Bilirubin 0.3  0.3 - 0.9 mg/dL   GLUCOSE, CAPILLARY     Status: Abnormal   Collection Time   07/31/12  4:22 PM      Component Value Range Comment   Glucose-Capillary 161 (*) 70 - 99 mg/dL   BASIC METABOLIC PANEL     Status: Abnormal   Collection Time   07/31/12  4:22 PM      Component Value Range Comment   Sodium 155 (*) 135 - 145 mEq/L    Potassium 4.4  3.5 - 5.1 mEq/L    Chloride 112  96 - 112 mEq/L    CO2 38 (*) 19 - 32 mEq/L    Glucose, Bld 211 (*) 70 - 99 mg/dL    BUN 118 (*) 6 - 23 mg/dL    Creatinine, Ser 1.51 (*) 0.50 - 1.35 mg/dL    Calcium 9.1  8.4 - 10.5 mg/dL    GFR calc non Af Amer 45 (*) >90 mL/min    GFR calc Af Amer 52 (*) >90 mL/min   GLUCOSE, CAPILLARY     Status: Abnormal   Collection Time   07/31/12  8:19 PM      Component Value Range Comment   Glucose-Capillary 150 (*) 70 - 99 mg/dL   GLUCOSE, CAPILLARY     Status: Abnormal   Collection Time   08/01/12 12:41 AM      Component Value Range Comment   Glucose-Capillary 157 (*) 70 - 99 mg/dL    Comment 1 Documented in Chart      Comment 2 Notify RN     GLUCOSE, CAPILLARY     Status: Abnormal   Collection Time   08/01/12  3:41 AM      Component  Value Range Comment   Glucose-Capillary 144 (*) 70 - 99 mg/dL    Comment 1 Documented in Chart      Comment 2 Notify RN     BASIC METABOLIC PANEL     Status: Abnormal   Collection Time   08/01/12  4:25 AM      Component Value Range Comment   Sodium 154 (*) 135 - 145 mEq/L    Potassium 4.4  3.5 - 5.1 mEq/L    Chloride 111  96 - 112 mEq/L    CO2 39 (*) 19 -  32 mEq/L    Glucose, Bld 148 (*) 70 - 99 mg/dL    BUN 119 (*) 6 - 23 mg/dL    Creatinine, Ser 1.54 (*) 0.50 - 1.35 mg/dL    Calcium 8.9  8.4 - 10.5 mg/dL    GFR calc non Af Amer 44 (*) >90 mL/min    GFR calc Af Amer 51 (*) >90 mL/min   CBC     Status: Abnormal   Collection Time   08/01/12  4:25 AM      Component Value Range Comment   WBC 10.7 (*) 4.0 - 10.5 K/uL    RBC 2.90 (*) 4.22 - 5.81 MIL/uL    Hemoglobin 8.5 (*) 13.0 - 17.0 g/dL    HCT 29.2 (*) 39.0 - 52.0 %    MCV 100.7 (*) 78.0 - 100.0 fL    MCH 29.3  26.0 - 34.0 pg    MCHC 29.1 (*) 30.0 - 36.0 g/dL    RDW 15.5  11.5 - 15.5 %    Platelets 112 (*) 150 - 400 K/uL   MAGNESIUM     Status: Normal   Collection Time   08/01/12  4:25 AM      Component Value Range Comment   Magnesium 2.0  1.5 - 2.5 mg/dL   PHOSPHORUS     Status: Normal   Collection Time   08/01/12  4:25 AM      Component Value Range Comment   Phosphorus 4.4  2.3 - 4.6 mg/dL   GLUCOSE, CAPILLARY     Status: Abnormal   Collection Time   08/01/12  8:25 AM      Component Value Range Comment   Glucose-Capillary 113 (*) 70 - 99 mg/dL   PREPARE RBC (CROSSMATCH)     Status: Normal   Collection Time   08/01/12 11:05 AM      Component Value Range Comment   Order Confirmation ORDER PROCESSED BY BLOOD BANK     TYPE AND SCREEN     Status: Normal (Preliminary result)   Collection Time   08/01/12 11:05 AM      Component Value Range Comment   ABO/RH(D) O POS      Antibody Screen NEG      Sample Expiration 08/04/2012      Unit Number [id number]      Blood Component Type RBC LR PHER1      Unit division 00       Status of Unit ISSUED      Unit tag comment VERBAL ORDERS PER DR QURESHI      Transfusion Status OK TO TRANSFUSE      Crossmatch Result COMPATIBLE     CBC     Status: Abnormal   Collection Time   08/01/12 11:05 AM      Component Value Range Comment   WBC 10.6 (*) 4.0 - 10.5 K/uL    RBC 2.75 (*) 4.22 - 5.81 MIL/uL    Hemoglobin 8.0 (*) 13.0 - 17.0 g/dL    HCT 27.3 (*) 39.0 - 52.0 %    MCV 99.3  78.0 - 100.0 fL    MCH 29.1  26.0 - 34.0 pg    MCHC 29.3 (*) 30.0 - 36.0 g/dL    RDW 15.4  11.5 - 15.5 %    Platelets 115 (*) 150 - 400 K/uL CONSISTENT WITH PREVIOUS RESULT  ABO/RH     Status: Normal   Collection Time   08/01/12 11:05 AM  Component Value Range Comment   ABO/RH(D) O POS     GLUCOSE, CAPILLARY     Status: Abnormal   Collection Time   08/01/12 12:21 PM      Component Value Range Comment   Glucose-Capillary 101 (*) 70 - 99 mg/dL    Chest Portable 1 View To Assess Tube Placement And Rule-out Pneumothorax  08/01/2012  *RADIOLOGY REPORT*  Clinical Data: Tracheostomy tube placement.  PORTABLE CHEST - 1 VIEW  Comparison: 08/01/2012 11:04 a.m.  Findings: Endotracheal tube removed.  Tracheostomy tube placed with the tip projecting slightly to the left of midline.  Left central line tip unchanged and may be within the left subclavian vein.  Left-sided chest tube in place.  No gross pneumothorax.  Left-sided pleural thickening remains.  Diffuse asymmetric air space disease relatively similar to the prior exam.  Cardiomegaly.  Calcified tortuous aorta.  IMPRESSION: Endotracheal tube removed.  Tracheostomy tube placed with the tip projecting slightly to the left of midline.  Left central line tip position unchanged and may be within the left subclavian vein.  Left-sided chest tube in place.  No gross pneumothorax.  Left-sided pleural thickening remains.  Diffuse asymmetric air space disease relatively similar to the prior exam.  Cardiomegaly.   Original Report Authenticated By: Genia Del,  M.D.    Dg Chest Port 1 View  08/01/2012  *RADIOLOGY REPORT*  Clinical Data: Chest tube.  PORTABLE CHEST - 1 VIEW  Comparison: 08/01/2012.  Findings: Endotracheal tube tip is 7.4 cm from the carina.  There is a new left basilar thoracostomy tube.  There is no pneumothorax identified.  The left subclavian line and enteric tube appear unchanged.  Evacuation of some of the left pleural effusion with improving aeration of the left lung compared to recent priors. Right pleural effusion remains present.  Bilateral airspace disease and atelectasis.  Loculated pleural fluid is present along the left mid chest.  IMPRESSION: New left basilar thoracostomy tube.  No pneumothorax.  Evacuation of some of the left pleural effusion. Other support apparatus unchanged.   Original Report Authenticated By: Dereck Ligas, M.D.    Dg Chest Port 1 View  08/01/2012  *RADIOLOGY REPORT*  Clinical Data: Intubated patient.  Edema.  Shortness of breath.  PORTABLE CHEST - 1 VIEW  Comparison: Chest x-ray 07/31/2012.  Findings: An endotracheal tube is in place with tip 6.4 cm above the carina. A nasogastric tube is seen extending into the stomach, however, the tip of the nasogastric tube extends below the lower margin of the image. There is a left-sided subclavian central venous catheter with tip terminating in the distal left innominate vein. Extensive bibasilar opacities may reflect areas of atelectasis and/or consolidation. There is cephalization of the pulmonary vasculature and slight indistinctness of the interstitial markings suggestive of mild pulmonary edema.  Mild cardiomegaly. Moderate bilateral pleural effusions.  Atherosclerosis in the thoracic aorta.  IMPRESSION: 1.  Allowing for slight differences in patient positioning, the radiographic appearance of the chest is essentially unchanged, as above.   Original Report Authenticated By: Vinnie Langton, M.D.    Dg Chest Port 1 View  07/31/2012  *RADIOLOGY REPORT*  Clinical Data:  Post thoracentesis.  PORTABLE CHEST - 1 VIEW  Comparison: 03/31/2013  Findings: 1200 hours.  Endotracheal tube tip is 5.2 cm above the base of the carina.  No evidence for pneumothorax status post thoracentesis.  There is bibasilar atelectasis/infiltrate. Probable associated small bilateral pleural effusions. Cardiopericardial silhouette is at upper limits of normal for size. Left subclavian  central line tip overlies the central mediastinum, cranial to the carina. Telemetry leads overlie the chest.  IMPRESSION: No evidence for pneumothorax status post thoracentesis.   Original Report Authenticated By: Misty Stanley, M.D.    Dg Chest Port 1 View  07/31/2012  *RADIOLOGY REPORT*  Clinical Data: Respiratory failure, evaluate endotracheal tube positioning  PORTABLE CHEST - 1 VIEW  Comparison: 07/30/2012; 01/28 07/20 14; chest CT - 07/29/2012  Findings: Grossly unchanged cardiac silhouette and mediastinal contours.  Stable positioning of support apparatus including left subclavian vein approach central venous catheter tip projecting over the central aspect of the left innominate vein.  Grossly unchanged small bilateral pleural effusions and associated bibasilar opacities, left greater than right.  Pulmonary vasculature remains indistinct with cephalization of flow.  No pneumothorax.  Unchanged bones.  IMPRESSION: 1.  Stable positioning of support apparatus.  No pneumothorax. 2.  Grossly unchanged findings suggestive of pulmonary edema, small bilateral effusions and bibasilar opacities, left greater than right, atelectasis versus infiltrate.   Original Report Authenticated By: Jake Seats, MD     Assessment/Plan (L)empyema s/p large bore CT per PCCM Discussed with family need for CT imaging and probable additional chest tube placement. Explained procedure and risks. Labs reviewed, PLTs and coags ok. Consent signed in chart  Ascencion Dike PA-C 08/01/2012, 4:02 PM

## 2012-08-01 NOTE — Procedures (Signed)
Bedside Tracheostomy Insertion Procedure Note   Patient Details:   Name: [name] DOB: [birth year] MRN: [id number]  Procedure: Tracheostomy  Pre Procedure Assessment: ET Tube Size:7.5 ET Tube secured at lip (cm):24 Bite block in place: Yes Breath Sounds: Rhonch  Post Procedure Assessment: BP 108/70  Pulse 91  Temp 101.8 F (38.8 C) (Rectal)  Resp 19  Ht 5\' 7"  (1.702 m)  Wt 234 lb 5.6 oz (106.3 kg)  BMI 36.70 kg/m2  SpO2 100% O2 sats: stable throughout Complications: No apparent complications Patient did tolerate procedure well Tracheostomy Brand:Shiley Tracheostomy Style:Cuffed Tracheostomy Size: 8.0 Tracheostomy Secured [id number] and velcro Tracheostomy Placement Confirmation:Trach cuff visualized and in place/ chest x-ray ordered   Pt tolerated procedure well Ventilator fi02 changed to 100% during procedure and rate changed to 21.  Heather Adkins,RRT,RCP notified of vent setting changes   Johnnette Gourd 08/01/2012, 2:51 PM

## 2012-08-01 NOTE — Procedures (Signed)
20 Fr. L thoracostomy for empyema to 20 cm water suction 60 cc turbid yellow fluid No comp

## 2012-08-01 NOTE — Procedures (Signed)
Chest tube placement After [place] chest gudiance  Supine, chloraprep solution , consent wife aware all risks  Sterile technique 1.2 vert incision, dissection to rib space, punctured through, limited output noted, pushed finger and swept any adhesions Placement 32 french attempted basilar tube, whemn to 14 cm , blood noted dark, nonpulsatilre, about 180 cc, compression Good hemostasis, sutured mattress CT in placed.  Concern from blood loss was tube trauma to adhesion  1 unit prbc given, hemodynamics unchanged No air leak  Lavon Paganini. Titus Mould, MD, [place] Pgr: [facility]

## 2012-08-01 NOTE — Procedures (Signed)
Bronchoscopy  for Percutaneous  Tracheostomy  Name: [name] MRN: [id number] DOB: 1940/11/06 Procedure: Bronchoscopy for Percutaneous Tracheostomy Indications: Diagnostic evaluation of the airways and Remove secretions In conjunction with: Dr. Titus Mould   Procedure Details Consent: Risks of procedure as well as the alternatives and risks of each were explained to the (patient/caregiver).  Consent for procedure obtained. Time Out: Verified patient identification, verified procedure, site/side was marked, verified correct patient position, special equipment/implants available, medications/allergies/relevent history reviewed, required imaging and test results available.  Performed  In preparation for procedure, patient was given 100% FiO2. Sedation: Benzodiazepines, Muscle relaxants and Etomidate  Airway entered and the following bronchi were examined: RML and LLL.   Procedures performed: Endotracheal Tube retracted in 2 cm increments. Cannulation of airway observed. Dilation observed. Placement of trachel tube  observed . No overt complications. Bronchoscope removed.    Evaluation Hemodynamic Status: BP stable throughout; O2 sats: stable throughout Patient's Current Condition: stable Specimens:  None Complications: No apparent complications Patient did tolerate procedure well.   [name] [name] ACNP [facility] PCCM Pager [phone] till 3 pm If no answer page [phone] 08/01/2012, 1:53 PM

## 2012-08-01 NOTE — Procedures (Signed)
[place] chest left Pre chest tube and gudiance 1. Noted locultaed partial effusion, identified diapragm  Lavon Paganini. Titus Mould, MD, [place] Pgr: [facility]

## 2012-08-01 NOTE — Progress Notes (Signed)
PULMONARY  / CRITICAL CARE MEDICINE  Name: OAKLIN VIVIANI MRN: [id number] DOB: Jun 15, 1941    LOS: 16  REFERRING MD :  [name]  CHIEF COMPLAINT:  Respiratory failure and pneumonia  BRIEF PATIENT DESCRIPTION: 72 yoM with HTN, CHF admitted to [facility] on 1/18 on transfer from [facility] ER with sepsis and ARDS secondary to influenza A/H1N1 multifocal pneumonia.   LINES / TUBES: L. CVL 1/18 >>  ETT 1/18 >>  CULTURES: [facility] [phone]): 1/18 Blood culture x 2 >> strep pneumoniae in 2 of 2 pansenstive   [place]: 1/18 BC >> NG 1/18 UC >> negative 1/18 Flu PCR >> Influenza A/ H1N1 1/18 lactate >> 2.3  1/19 PCT >> 8 >> 6.5 1/19 Resp: rare GPC in pairs. No growth at 2 days.   1/25 BC x2: NGTD  ANTIBIOTICS: 1/18 Vanc >> 1/23 restarted 1/25 >>> 1/18 Levaquin >> 1/23 1/18 Tamiflu >> planned end date 1/28 1/18 Cefepime >>1/23 1/23 Rocephin 1/23>>> 1/23 1/24 Levofloxacin >>> 1/27  SIGNIFICANT EVENTS / studies:  1/18 Transfer to [facility] from [place] for respiratory failure started on Nimbex protocol for SEVERE ARDS.  1/27 ct chest- free flowing rt effusion small, partial loculated effusion left small 1/29 diagnostic left thora, exudative 1/30 fevers  LEVEL OF CARE:  ICU PRIMARY SERVICE:  PCCM CONSULTANTS:  None CODE STATUS: Full DIET:  Tube feeds DVT Px:  Heparin SQ GI Px:  Protonix  HISTORY OF PRESENT ILLNESS:  72 yo male with known hx of HTN, CHF presented to [facility] ER on 1/18 w/ 1 week hx of cough , congestion w/ progressively worsening dyspnea . Hypoxia in ER , decompensated requiring intubation . CXR w/ multilobar PNA . Hypotension unresponsive to fluid challenged requiring pressors. CCM asked to accept at [facility] ICU 1/18. Transported via [facility] . Renal failure w/ scr ~4 . Received 6L + in ER.   SUBJECTIVE / INTERVAL HISTORY:  Intubated and sedated.  More alert this AM.  UOP good yesterday but +1.5L yesterday.  Bedside diagnostic thoracocentesis, tolerated well  with no pneumo. Continues to be febrile overnight   VITAL SIGNS: Temp:  [99.4 F (37.4 C)-101.5 F (38.6 C)] 100.5 F (38.1 C) (01/30 0400) Pulse Rate:  [92-130] 95  (01/30 0700) Resp:  [12-34] 19  (01/30 0700) BP: (72-119)/(42-63) 105/55 mmHg (01/30 0700) SpO2:  [91 %-99 %] 95 % (01/30 0700) FiO2 (%):  [59.7 %-60.6 %] 60.1 % (01/30 0700) Weight:  [234 lb 5.6 oz (106.3 kg)] 234 lb 5.6 oz (106.3 kg) (01/30 0400) HEMODYNAMICS: CVP:  [8 mmHg-11 mmHg] 8 mmHg VENTILATOR SETTINGS: Vent Mode:  [-] PRVC FiO2 (%):  [59.7 %-60.6 %] 60.1 % Set Rate:  [18 bmp] 18 bmp Vt Set:  [400 mL] 400 mL PEEP:  [5 cmH20-10 cmH20] 5 cmH20 Plateau Pressure:  [17 cmH20-20 cmH20] 19 cmH20 INTAKE / OUTPUT: Intake/Output      01/29 0701 - 01/30 0700 01/30 0701 - 01/31 0700   I.V. (mL/kg) 3462.5 (32.6)    Other 40    NG/GT 700    IV Piggyback 250    Total Intake(mL/kg) 4452.5 (41.9)    Urine (mL/kg/hr) 2730 (1.1)    Stool 200    Total Output 2930    Net +1522.5          PHYSICAL EXAMINATION: General: Intubated and sedated, opens eyes to stimulation but does not track to voice.   Head: Normocephalic, atraumatic, right eye with corneal scratch and scleral hemorrhage.   Lungs:  Normal respiratory  effort. Mild bronchial breath sounds bilaterally.   Heart: RRR, no MRG.   Abdomen:  BS normal. Soft, nondistended, non-tender.   Extremities: 3+ pitting edema in the bilateral upper and lower extremities.    Skin: Confluent rash over the back, lower abdomen, and dorsal arms with several areas of blistering and skin breakdown.  No pus drainage.     IMAGING Dg Chest Port 1 View  07/31/2012  *RADIOLOGY REPORT*  Clinical Data: Post thoracentesis.  PORTABLE CHEST - 1 VIEW  Comparison: 03/31/2013  Findings: 1200 hours.  Endotracheal tube tip is 5.2 cm above the base of the carina.  No evidence for pneumothorax status post thoracentesis.  There is bibasilar atelectasis/infiltrate. Probable associated small bilateral  pleural effusions. Cardiopericardial silhouette is at upper limits of normal for size. Left subclavian central line tip overlies the central mediastinum, cranial to the carina. Telemetry leads overlie the chest.  IMPRESSION: No evidence for pneumothorax status post thoracentesis.   Original Report Authenticated By: Misty Stanley, M.D.    Dg Chest Port 1 View  07/31/2012  *RADIOLOGY REPORT*  Clinical Data: Respiratory failure, evaluate endotracheal tube positioning  PORTABLE CHEST - 1 VIEW  Comparison: 07/30/2012; 01/28 07/20 14; chest CT - 07/29/2012  Findings: Grossly unchanged cardiac silhouette and mediastinal contours.  Stable positioning of support apparatus including left subclavian vein approach central venous catheter tip projecting over the central aspect of the left innominate vein.  Grossly unchanged small bilateral pleural effusions and associated bibasilar opacities, left greater than right.  Pulmonary vasculature remains indistinct with cephalization of flow.  No pneumothorax.  Unchanged bones.  IMPRESSION: 1.  Stable positioning of support apparatus.  No pneumothorax. 2.  Grossly unchanged findings suggestive of pulmonary edema, small bilateral effusions and bibasilar opacities, left greater than right, atelectasis versus infiltrate.   Original Report Authenticated By: Jake Seats, MD    DIAGNOSES: Active Problems:  Acute respiratory failure with hypoxia  ARDS (adult respiratory distress syndrome)  Acute renal failure  Basic Metabolic Panel:  Basename 08/01/12 0425 07/31/12 1622 07/31/12 0400  NA 154* 155* --  K 4.4 4.4 --  CL 111 112 --  CO2 39* 38* --  GLUCOSE 148* 211* --  BUN 119* 118* --  CREATININE 1.54* 1.51* --  CALCIUM 8.9 9.1 --  MG 2.0 -- 2.0  PHOS 4.4 -- 3.5   Liver Function Tests:  [facility] 07/31/12 1622  AST 42*  ALT 56*  ALKPHOS 59  BILITOT 0.4  PROT 5.6*  ALBUMIN 1.6*   CBC:  Basename 08/01/12 0425 07/31/12 0400  WBC 10.7* 14.8*  NEUTROABS -- --   HGB 8.5* 9.6*  HCT 29.2* 32.7*  MCV 100.7* 99.7  PLT 112* 130*   CBG:  Basename 08/01/12 0341 08/01/12 0041 07/31/12 2019 07/31/12 1622 07/31/12 1237 07/31/12 0830  GLUCAP 144* 157* 150* 161* 194* 183*   Coagulation:  Basename 07/29/12 1225  LABPROT 16.6*  INR 1.38   Misc. Labs: LDH blood: 201  Thoracocentesis labs: Glucose 142 LDH 6732 PH 9.0 WBC: 2600 Diff: 90% Neutrophils GS: no organisms seen.   ASSESSMENT / PLAN:  PULMONARY  Lab 07/31/12 0757 07/30/12 0801 07/29/12 1427  PHART 7.406 7.415 7.347*  PCO2ART 68.0* 68.5* 79.1*  PO2ART 54.0* 60.0* 66.0*  HCO3 42.5* 43.9* 43.2*  TCO2 44 46 46   A:  1) Multilobar PNA - respiratory viral panel shows influenza only.  flu PCR positive for H1N1. sputum culture from [facility] showed S. Pneumo.  [facility] sputum culture shows GPC in  pairs which is likely S. pneumo but there was no growth in the final culture. Treating for flu and S. pneumo. CT of chest shows a moderate partially loculated left pleural effusion and a small right pleural effusion that appears free flowing.  Diagnostic thoracocentesis done yesterday shows exudative effusion with very high pH and high WBC count.   2) Influenza A/ H1N1 infection: Positive flu PCR and now resp viral panel shows no other viruses.   3) Severe ARDS - Able to maintain off nimbex after a long period on.  Oxygenation stable on FiO2 60% and PEEP 10.   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 5) exudative fluid, likely empyema (low yield growth from being on abx)   P:   - See ID. - completed 10 days of [place]  - Continue to wean per ARDS protocol - does not appear that we have room to reduce O2 needs, peep fio2 remain - Permissive hypercapnia not needed, plat wnl - Limit bronchodilators in ards - Trach today - see ID for CT evaluation- fo chest tube - Consider Chest tube today.   CARDIOVASCULAR  Lab  07/25/12 1000  CKTOTAL --  CKMB --  TROPONINI 0.64*   A:  1) Sepsis - 2/2 multilobar PNA, influenza A/ H1N1.  Febrile for the last few days still though WBC count trending downward.   2) Elevated troponins - question of demand ischemia vs NSTEMI , no further therapy recommedned 3) AAA - 6.4 cm per CT Abd 1/19.  Will continue to monitor unless developing hypotension or hemodynamic compromise. 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  P:  - Monitor AAA for now, low threshold for vascular consult if worsening hemodynamically will monitor for now - Continue aspirin 81mg  daily. - Levo started, for chest tube now, then follow needs  RENAL  Lab 08/01/12 0425 07/31/12 1622 07/31/12 0400  NA 154* 155* 157*  K 4.4 4.4 3.5  CL 111 112 112  CO2 39* 38* 42*  BUN 119* 118* 130*  CREATININE 1.54* 1.51* 1.46*  GLUCOSE 148* 211* 219*   A:  1) Acute renal failure  - ATN in setting of hypotension and volume depletion in setting of sepsis, improving currently.  CVP stable 11-13.  UOP brisk without lasix yesterday again.  Creatinine improving and BUN down 2. Hyperphosphatemia: Improving with resolution of #1 2) Hypokalemia - repleted today 3) Hypernatremia: Na stable at 154 this AM.   Large free water deficit.    P:  - Continue to monitor lytes daily  - Renally dose medications. -hold T F - free water to 400 q4 and D5 to 125 ml/hr  - Bmet in the AM.  Continue correcting na  GASTROINTESTINAL  A: No acute issues. Having BM.  P:  - Continue tube feeds after trach - ppi  HEMATOLOGIC  Lab 08/01/12 0425 07/31/12 0400 07/30/12 0346 07/29/12 1225  WBC 10.7* 14.8* 19.1* --  HGB 8.5* 9.6* 9.9* --  HCT 29.2* 32.7* 33.3* --  PLT 112* 130* 145* --  APTT -- -- -- 35  INR -- -- -- 1.38   A:  1) Acute normocytic anemia - likely related to critical illness, unknown baseline. FOBT positive yesterday.  HgB stable slowly dropping slowly.  INR stable  2) Thrombocytopenia -  unknown baseline, low normal in 05/2008. No obvious source of bleed. Likely related to critical illness. Stable.  3) hemoconcetration P:  - Continue to monitor CBC with pos balance, for dilution, only mild changes as of now - sub q hep coags reviewed, ok for trach  INFECTIOUS  Lab 08/01/12 0425 07/31/12 0400 07/30/12 0346  WBC 10.7* 14.8* 19.1*  NEUTROABS -- -- --  LATICACIDVEN -- -- --  PROCALCITON -- -- --   A:  1) Sepsis secondary to multilobar PNA and influenza 2) S. Pneumonia bacteremia - 2/2 blood cultures positive with S. Pneumo, pan sensitive from [place].  BC negative here at [facility]. Resp culture from [facility] showing [place] in pairs but no growth on final culture. Narrowed to Levaquin for 14 days with continued rash stopped Levaquin yesterday and continued on only Vanc.  Today (1/30) is day 13 of antibiotics. Continues to have fevers over the last few days but WBC count is trending downward.  Thora with exudative effusion.  May need drainage to resolve fever.   3. Drug rash with fever: likely secondary to Cephalosporins.  Changed to Levaquin 1/24 and then Vanc on 1/27 for planned 14 days. Several areas of blistering noted yesterday and into today, does not appear to be Steven-johnson's  4. Leukocytosis - continues.  Afebrile last night.  CT of chest shows partially loculated effusion in the left lung.  parapneumonic vs empyema.    P:  - Cortisol of 33, stress steroids stopped.   - Vanc currently given sensitivities from blood culture from [facility] 12/14 days.  - completedTamiflu day 10/10. - Consider chest tube today for drainage of effusion Restart stress roids  ENDOCRINE / RHEUMATOLOGIC  Lab 08/01/12 0341 08/01/12 0041 07/31/12 2019 07/31/12 1622 07/31/12 1237  GLUCAP 144* 157* 150* 161* 194*   A: 1) Prediabetes - blood sugars stable elevated with increased D5 drip.  Better controlled today but still getting significant SSI so will increased lantus again.  P:  - ICU  hyperglycemia protocol.  - increase Lantus to 25 units In goal 140-180  NEUROLOGIC / PSYCHIATRIC RAAS Goal: -3 CAM-ICU: Cannot assess     A: 1) Acute encephalopathy - in setting of sepsis vs now hypernatremia vs Uremia. Hypernatremia not rising finally and BUN starting to fall with resolution of ATN.  2. 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  Scleral hemorrhage today so may need to consult ophtho today.  P:  - Continue with RAAS goal of -1 to -2, fent only - intermittent Versed -add Risperdal in hopes to avoid versed -correct Na slowly -ct head neg  CLINICAL SUMMARY: 21 yoM with HTN, CHF admitted to [facility] MICU on transfer from [facility] on 07/20/2012 secondary to sepsis in setting  of influenza A/ H1N1 / multilobar PNA. Intubated in ED secondary to hypoxic respiratory failure. Has ARDS on the protocol nimbex 1/19 to 1/24.  Started empirically on broad spectrum ABx on admission and narrowed to Vanc for 14 days of total treatment.  Likely drug fever with Cephalosporins though continuing lower then expected, ?due to effusion? CT of chest showed partially loculated effusion on the left tapped yesterday, may need chest tube today.  Plan for trach today.   Trish Fountain, MD Internal Medicine Resident, PGY III Co-Chief Resident, Internal Medicine Pager: [phone] 08/01/2012 7:26 AM   Ccm time 35 min   Lavon Paganini. Titus Mould, MD, [place] Pgr: [place] [facility] And edited infull  Lavon Paganini. Titus Mould, MD, [place] Pgr: [place] [facility]

## 2012-08-01 NOTE — Progress Notes (Signed)
Chaplain Note:  Chaplain visited with pt, pt's daughter, and nurse.  Pt was in bed, asleep, and did not communicate during this visit. Chaplain provided spiritual comfort and support for pt's daughter and nurse.  Pt's daughter is having difficulties accepting the gravity of the pt's condition.  At this moment she harbors unrealistic expectation for pt's full recovery.  Further spiritual support will be helpful.  Chaplain will follow up as needed.  08/01/12 1400  Clinical Encounter Type  Visited With Patient and family together;Health care provider  Visit Type Spiritual support  Referral From Nurse  Spiritual Encounters  Spiritual Needs Emotional  Stress Factors  Patient Stress Factors Major life changes;Health changes  Family Stress Factors Major life changes   Jearld Lesch, [place] [phone]

## 2012-08-01 NOTE — Op Note (Signed)
NAMEDARRAL, KHOSLA NO.:  [id number]  MEDICAL RECORD NO.:  [id number]  LOCATION:  2113                         FACILITY:  [facility]  PHYSICIAN:  Raylene Miyamoto, MD DATE OF BIRTH:  Apr 29, 1941  DATE OF PROCEDURE: DATE OF DISCHARGE:                              OPERATIVE REPORT   PROCEDURE:  Percutaneous tracheostomy.  Consent was obtained from the patient's wife, fully aware of risks and benefits of the procedure.  PREOPERATIVE DIAGNOSIS:  Severe acute respiratory distress syndrome, strep pneumonia, bacteremia.  POSTOPERATIVE DIAGNOSIS:  Severe acute respiratory distress syndrome, status post tracheostomy, inability to wean.  BRONCHOSCOPIST FOR THE PROCEDURE:  Zella Richer. Alva Garnet, MD.  FIRST ASSISTANT:  Gaylyn Lambert, MSN, ACNP  The patient was placed in supine position.  Chlorhexidine preparation was used to sterilize the operative site.  The bronchoscopist placed the bronchoscope through the endotracheal tube back to approximately 18 cm. Lidocaine 8 mL plus epinephrine was injected over 2nd endotracheal space.  A 1.2 cm vertical incision was made.  Dissection was made down to the tracheal planes and no significant vascular anomalies.  I then placed the 18-gauge needle over white catheter sheath into the airway which was noted without any posterior wall injury and I took the needle out.  I then placed a wire through the white catheter sheath and removed the white catheter sheath.  I placed a 14-French punch dilator over the wire in and out.  I then placed a progressive rhino dilator to 37-French over a glider successfully in and out airway.  I placed a 28-French dilator over 8 size tracheostomy into the airway successfully.  Blood loss for the procedure was less than 5 mL.  The bronchoscopist placed the bronchoscope through the new tracheostomy, noted carina approximately 4.5 cm below without any posterior wall injury or active bleeding.  The patient  tolerated the procedure quite well.     Raylene Miyamoto, MD     DJF/MEDQ  D:  08/01/2012  T:  08/01/2012  Job:  [id number]

## 2012-08-01 NOTE — ED Notes (Signed)
Pt wasn't given any sedation.  ICU nurse was with pt entire procedure

## 2012-08-01 NOTE — Procedures (Signed)
Chidiebere Wynn, MD ; PCCM service Mobile [phone].  After 5:30 PM or weekends, call [phone]  

## 2012-08-01 NOTE — Consult Note (Signed)
INFECTIOUS DISEASE CONSULT NOTE  Date of Admission:  07/20/2012  Date of Consult:  08/01/2012  Reason for Consult: Empyema, Rash Referring Physician: Harland Dingwall  Impression/Recommendation Empyema Rash, ADR Would- Continue vanco Consider adding clinda as second agent (will also add anearobes for his empyema) Suggest ceftriaxone as cause of his rash (although levaquin could also do this but much less commonly) Suggest his continued fevers are from ADR/rash and/or empyema  Comment- hopefully draining his empyema will improve his fevers. Will watch as cephalosporin comes out of his system.  Thank you so much for this interesting consult,   Bobby Rumpf [id number]  [name] is an 72 y.o. male.  HPI: 72 yo M with hx of CHF comes to [facility] on 1-18 with sepsis, multifocal pna, and H1N1. He was found as well to have Streptococcal bacteremia. He was transferred here on 1-18 with respiratory failure, ARF, and required pressors. He has remained febrile, now has empyema. He underwent diagnostic thoracentesis on 1-29 showing LDH 6732, pH 9.0, WBC 2600 (90%N).  Over last 5 days (first noted 1-24) has developed a rash on his back. This initially started as lacy and then progressed to become blistering.   Past Medical History  Diagnosis Date  . Hypertension   . Hyperlipidemia   . OSA (obstructive sleep apnea)     on cpap  . Prediabetes     History reviewed. No pertinent past surgical history.   Allergies  Allergen Reactions  . Cephalosporins Rash    Medications:  Scheduled:   . midazolam      . antiseptic oral rinse  15 mL Mouth Rinse QID  . aspirin  81 mg Per Tube Daily  . chlorhexidine  15 mL Mouth Rinse BID  . etomidate  40 mg Intravenous Once  . feeding supplement (OXEPA)  1,000 mL Per Tube Q24H  . feeding supplement  60 mL Per Tube TID  . fentaNYL  200 mcg Intravenous Once  . free water  400 mL Per Tube Q4H  . heparin  5,000 Units Subcutaneous Q8H  .  hydrocortisone sod succinate (SOLU-CORTEF) injection  50 mg Intravenous Q6H  . insulin aspart  0-20 Units Subcutaneous Q4H  . insulin glargine  25 Units Subcutaneous Q24H  . midazolam  4 mg Intravenous Once  . multivitamin  5 mL Per Tube Daily  . pantoprazole sodium  40 mg Per Tube Daily  . propofol  5-70 mcg/kg/min Intravenous Once  . risperiDONE  1 mg Oral BID  . vancomycin  1,250 mg Intravenous Q24H  . vecuronium  10 mg Intravenous Once    ANTIBIOTICS:  1/18 Vanc >> 1/23 restarted 1/25 >>>  1/18 Levaquin >> 1/23  1/18 Tamiflu >> planned end date 1/28  1/18 Cefepime >>1/23  1/23 Rocephin 1/23>>> 1/23  1/24 Levofloxacin >>> 1/27   Social History:  reports that he has been smoking Cigarettes.  He has been smoking about 2 packs per day. He does not have any smokeless tobacco history on file. He reports that he does not drink alcohol or use illicit drugs.  History reviewed. No pertinent family history.  General ROS: unobtainable  Blood pressure 111/57, pulse 103, temperature 101.1 F (38.4 C), temperature source Rectal, resp. rate 23, height 5\' 7"  (1.702 m), weight 106.3 kg (234 lb 5.6 oz), SpO2 97.00%. General appearance: on vent Eyes: injected R eye, swollen Throat: intubated Lungs: diminished breath sounds L > R Chest wall: chest tube on L Heart: regular rate and rhythm Abdomen: normal  findings: bowel sounds normal and soft, non-tender Extremities: RLE cool vs LLE Derm- moderate blistering on back, shoulders, erythema on arms/back.    Results for orders placed during the hospital encounter of 07/20/12 (from the past 48 hour(s))  GLUCOSE, CAPILLARY     Status: Abnormal   Collection Time   07/30/12 11:58 AM      Component Value Range Comment   Glucose-Capillary 216 (*) 70 - 99 mg/dL   GLUCOSE, CAPILLARY     Status: Abnormal   Collection Time   07/30/12  3:52 PM      Component Value Range Comment   Glucose-Capillary 173 (*) 70 - 99 mg/dL   GLUCOSE, CAPILLARY      Status: Abnormal   Collection Time   07/30/12  8:17 PM      Component Value Range Comment   Glucose-Capillary 181 (*) 70 - 99 mg/dL   BASIC METABOLIC PANEL     Status: Abnormal   Collection Time   07/30/12  9:15 PM      Component Value Range Comment   Sodium 162 (*) 135 - 145 mEq/L    Potassium 3.6  3.5 - 5.1 mEq/L    Chloride 116 (*) 96 - 112 mEq/L    CO2 41 (*) 19 - 32 mEq/L    Glucose, Bld 189 (*) 70 - 99 mg/dL    BUN 143 (*) 6 - 23 mg/dL    Creatinine, Ser 1.60 (*) 0.50 - 1.35 mg/dL    Calcium 9.0  8.4 - 10.5 mg/dL    GFR calc non Af Amer 42 (*) >90 mL/min    GFR calc Af Amer 49 (*) >90 mL/min   GLUCOSE, CAPILLARY     Status: Abnormal   Collection Time   07/31/12 12:09 AM      Component Value Range Comment   Glucose-Capillary 190 (*) 70 - 99 mg/dL    Comment 1 Documented in Chart      Comment 2 Notify RN     BASIC METABOLIC PANEL     Status: Abnormal   Collection Time   07/31/12  4:00 AM      Component Value Range Comment   Sodium 157 (*) 135 - 145 mEq/L    Potassium 3.5  3.5 - 5.1 mEq/L    Chloride 112  96 - 112 mEq/L    CO2 42 (*) 19 - 32 mEq/L    Glucose, Bld 219 (*) 70 - 99 mg/dL    BUN 130 (*) 6 - 23 mg/dL    Creatinine, Ser 1.46 (*) 0.50 - 1.35 mg/dL    Calcium 9.0  8.4 - 10.5 mg/dL    GFR calc non Af Amer 47 (*) >90 mL/min    GFR calc Af Amer 54 (*) >90 mL/min   CBC     Status: Abnormal   Collection Time   07/31/12  4:00 AM      Component Value Range Comment   WBC 14.8 (*) 4.0 - 10.5 K/uL    RBC 3.28 (*) 4.22 - 5.81 MIL/uL    Hemoglobin 9.6 (*) 13.0 - 17.0 g/dL    HCT 32.7 (*) 39.0 - 52.0 %    MCV 99.7  78.0 - 100.0 fL    MCH 29.3  26.0 - 34.0 pg    MCHC 29.4 (*) 30.0 - 36.0 g/dL    RDW 15.5  11.5 - 15.5 %    Platelets 130 (*) 150 - 400 K/uL   MAGNESIUM  Status: Normal   Collection Time   07/31/12  4:00 AM      Component Value Range Comment   Magnesium 2.0  1.5 - 2.5 mg/dL   PHOSPHORUS     Status: Normal   Collection Time   07/31/12  4:00 AM       Component Value Range Comment   Phosphorus 3.5  2.3 - 4.6 mg/dL   LACTATE DEHYDROGENASE     Status: Normal   Collection Time   07/31/12  4:00 AM      Component Value Range Comment   LDH 201  94 - 250 U/L   GLUCOSE, CAPILLARY     Status: Abnormal   Collection Time   07/31/12  4:08 AM      Component Value Range Comment   Glucose-Capillary 169 (*) 70 - 99 mg/dL    Comment 1 Documented in Chart      Comment 2 Notify RN     POCT I-STAT 3, BLOOD GAS (G3+)     Status: Abnormal   Collection Time   07/31/12  7:57 AM      Component Value Range Comment   pH, Arterial 7.406  7.350 - 7.450    pCO2 arterial 68.0 (*) 35.0 - 45.0 mmHg    pO2, Arterial 54.0 (*) 80.0 - 100.0 mmHg    Bicarbonate 42.5 (*) 20.0 - 24.0 mEq/L    TCO2 44  0 - 100 mmol/L    O2 Saturation 85.0      Acid-Base Excess 16.0 (*) 0.0 - 2.0 mmol/L    Patient temperature 99.6 F      Collection site RADIAL, ALLEN'S TEST ACCEPTABLE      Drawn by RT      Sample type ARTERIAL      Comment NOTIFIED PHYSICIAN     GLUCOSE, CAPILLARY     Status: Abnormal   Collection Time   07/31/12  8:30 AM      Component Value Range Comment   Glucose-Capillary 183 (*) 70 - 99 mg/dL   LACTATE DEHYDROGENASE, BODY FLUID     Status: Abnormal   Collection Time   07/31/12 11:47 AM      Component Value Range Comment   LD, Fluid 6732 (*) 3 - 23 U/L RESULTS CONFIRMED BY MANUAL DILUTION   Fluid Type-FLDH PLEURAL     BODY FLUID CULTURE     Status: Normal (Preliminary result)   Collection Time   07/31/12 11:47 AM      Component Value Range Comment   Specimen Description PLEURAL FLUID LEFT      Special Requests 3.0 ML FLUID      Gram Stain        Value: MODERATE WBC PRESENT,BOTH PMN AND MONONUCLEAR     NO ORGANISMS SEEN   Culture NO GROWTH 1 DAY      Report Status PENDING     PROTEIN, BODY FLUID     Status: Normal   Collection Time   07/31/12 11:47 AM      Component Value Range Comment   Total protein, fluid 3.0   NO NORMAL RANGE ESTABLISHED FOR THIS  TEST   Fluid Type-FTP PLEURAL     GLUCOSE, SEROUS FLUID     Status: Normal   Collection Time   07/31/12 11:47 AM      Component Value Range Comment   Glucose, Fluid 142      Fluid Type-FGLU PLEURAL     BODY FLUID CELL COUNT WITH DIFFERENTIAL  Status: Abnormal   Collection Time   07/31/12 11:47 AM      Component Value Range Comment   Fluid Type-FCT PLEURAL      Color, Fluid AMBER (*) YELLOW    Appearance, Fluid CLOUDY (*) CLEAR    WBC, Fluid 2600 (*) 0 - 1000 cu mm    Neutrophil Count, Fluid 90 (*) 0 - 25 %    Lymphs, Fluid 4      Monocyte-Macrophage-Serous Fluid 6 (*) 50 - 90 %    Eos, Fluid 0      Other Cells, Fluid COUNT MAY BE AFFECTED BY PARTICULATE     PH, BODY FLUID     Status: Normal   Collection Time   07/31/12 11:47 AM      Component Value Range Comment   pH, Fluid Type     CORRECTED ON 01/30 AT 0141: PREVIOUSLY REPORTED AS PLEURAL FLUID LEFT CORRECTED ON 01/29 AT 1230: PREVIOUSLY REPORTED AS PLEURAL   Value: CORRECTED ON 01/29 AT 1230: PREVIOUSLY REPORTED AS PLEURAL   pH, Fluid 9.00     PATHOLOGIST SMEAR REVIEW     Status: Normal   Collection Time   07/31/12 11:47 AM      Component Value Range Comment   Path Review        Value: Abundant PMN's with no organisms seen. Correlate with gram stain and culture.  GLUCOSE, CAPILLARY     Status: Abnormal   Collection Time   07/31/12 12:37 PM      Component Value Range Comment   Glucose-Capillary 194 (*) 70 - 99 mg/dL   HEPATIC FUNCTION PANEL     Status: Abnormal   Collection Time   07/31/12  4:22 PM      Component Value Range Comment   Total Protein 5.6 (*) 6.0 - 8.3 g/dL    Albumin 1.6 (*) 3.5 - 5.2 g/dL    AST 42 (*) 0 - 37 U/L    ALT 56 (*) 0 - 53 U/L    Alkaline Phosphatase 59  39 - 117 U/L    Total Bilirubin 0.4  0.3 - 1.2 mg/dL    Bilirubin, Direct 0.1  0.0 - 0.3 mg/dL    Indirect Bilirubin 0.3  0.3 - 0.9 mg/dL   GLUCOSE, CAPILLARY     Status: Abnormal   Collection Time   07/31/12  4:22 PM      Component Value  Range Comment   Glucose-Capillary 161 (*) 70 - 99 mg/dL   BASIC METABOLIC PANEL     Status: Abnormal   Collection Time   07/31/12  4:22 PM      Component Value Range Comment   Sodium 155 (*) 135 - 145 mEq/L    Potassium 4.4  3.5 - 5.1 mEq/L    Chloride 112  96 - 112 mEq/L    CO2 38 (*) 19 - 32 mEq/L    Glucose, Bld 211 (*) 70 - 99 mg/dL    BUN 118 (*) 6 - 23 mg/dL    Creatinine, Ser 1.51 (*) 0.50 - 1.35 mg/dL    Calcium 9.1  8.4 - 10.5 mg/dL    GFR calc non Af Amer 45 (*) >90 mL/min    GFR calc Af Amer 52 (*) >90 mL/min   GLUCOSE, CAPILLARY     Status: Abnormal   Collection Time   07/31/12  8:19 PM      Component Value Range Comment   Glucose-Capillary 150 (*) 70 - 99 mg/dL  GLUCOSE, CAPILLARY     Status: Abnormal   Collection Time   08/01/12 12:41 AM      Component Value Range Comment   Glucose-Capillary 157 (*) 70 - 99 mg/dL    Comment 1 Documented in Chart      Comment 2 Notify RN     GLUCOSE, CAPILLARY     Status: Abnormal   Collection Time   08/01/12  3:41 AM      Component Value Range Comment   Glucose-Capillary 144 (*) 70 - 99 mg/dL    Comment 1 Documented in Chart      Comment 2 Notify RN     BASIC METABOLIC PANEL     Status: Abnormal   Collection Time   08/01/12  4:25 AM      Component Value Range Comment   Sodium 154 (*) 135 - 145 mEq/L    Potassium 4.4  3.5 - 5.1 mEq/L    Chloride 111  96 - 112 mEq/L    CO2 39 (*) 19 - 32 mEq/L    Glucose, Bld 148 (*) 70 - 99 mg/dL    BUN 119 (*) 6 - 23 mg/dL    Creatinine, Ser 1.54 (*) 0.50 - 1.35 mg/dL    Calcium 8.9  8.4 - 10.5 mg/dL    GFR calc non Af Amer 44 (*) >90 mL/min    GFR calc Af Amer 51 (*) >90 mL/min   CBC     Status: Abnormal   Collection Time   08/01/12  4:25 AM      Component Value Range Comment   WBC 10.7 (*) 4.0 - 10.5 K/uL    RBC 2.90 (*) 4.22 - 5.81 MIL/uL    Hemoglobin 8.5 (*) 13.0 - 17.0 g/dL    HCT 29.2 (*) 39.0 - 52.0 %    MCV 100.7 (*) 78.0 - 100.0 fL    MCH 29.3  26.0 - 34.0 pg    MCHC 29.1  (*) 30.0 - 36.0 g/dL    RDW 15.5  11.5 - 15.5 %    Platelets 112 (*) 150 - 400 K/uL   MAGNESIUM     Status: Normal   Collection Time   08/01/12  4:25 AM      Component Value Range Comment   Magnesium 2.0  1.5 - 2.5 mg/dL   PHOSPHORUS     Status: Normal   Collection Time   08/01/12  4:25 AM      Component Value Range Comment   Phosphorus 4.4  2.3 - 4.6 mg/dL   GLUCOSE, CAPILLARY     Status: Abnormal   Collection Time   08/01/12  8:25 AM      Component Value Range Comment   Glucose-Capillary 113 (*) 70 - 99 mg/dL       Component Value Date/Time   SDES PLEURAL FLUID LEFT 07/31/2012 1147   SPECREQUEST 3.0 ML FLUID 07/31/2012 1147   CULT NO GROWTH 1 DAY 07/31/2012 1147   REPTSTATUS PENDING 07/31/2012 1147   Dg Chest Port 1 View  08/01/2012  *RADIOLOGY REPORT*  Clinical Data: Intubated patient.  Edema.  Shortness of breath.  PORTABLE CHEST - 1 VIEW  Comparison: Chest x-ray 07/31/2012.  Findings: An endotracheal tube is in place with tip 6.4 cm above the carina. A nasogastric tube is seen extending into the stomach, however, the tip of the nasogastric tube extends below the lower margin of the image. There is a left-sided subclavian central venous catheter with tip terminating in  the distal left innominate vein. Extensive bibasilar opacities may reflect areas of atelectasis and/or consolidation. There is cephalization of the pulmonary vasculature and slight indistinctness of the interstitial markings suggestive of mild pulmonary edema.  Mild cardiomegaly. Moderate bilateral pleural effusions.  Atherosclerosis in the thoracic aorta.  IMPRESSION: 1.  Allowing for slight differences in patient positioning, the radiographic appearance of the chest is essentially unchanged, as above.   Original Report Authenticated By: Vinnie Langton, M.D.    Dg Chest Port 1 View  07/31/2012  *RADIOLOGY REPORT*  Clinical Data: Post thoracentesis.  PORTABLE CHEST - 1 VIEW  Comparison: 03/31/2013  Findings: 1200 hours.   Endotracheal tube tip is 5.2 cm above the base of the carina.  No evidence for pneumothorax status post thoracentesis.  There is bibasilar atelectasis/infiltrate. Probable associated small bilateral pleural effusions. Cardiopericardial silhouette is at upper limits of normal for size. Left subclavian central line tip overlies the central mediastinum, cranial to the carina. Telemetry leads overlie the chest.  IMPRESSION: No evidence for pneumothorax status post thoracentesis.   Original Report Authenticated By: Misty Stanley, M.D.    Dg Chest Port 1 View  07/31/2012  *RADIOLOGY REPORT*  Clinical Data: Respiratory failure, evaluate endotracheal tube positioning  PORTABLE CHEST - 1 VIEW  Comparison: 07/30/2012; 01/28 07/20 14; chest CT - 07/29/2012  Findings: Grossly unchanged cardiac silhouette and mediastinal contours.  Stable positioning of support apparatus including left subclavian vein approach central venous catheter tip projecting over the central aspect of the left innominate vein.  Grossly unchanged small bilateral pleural effusions and associated bibasilar opacities, left greater than right.  Pulmonary vasculature remains indistinct with cephalization of flow.  No pneumothorax.  Unchanged bones.  IMPRESSION: 1.  Stable positioning of support apparatus.  No pneumothorax. 2.  Grossly unchanged findings suggestive of pulmonary edema, small bilateral effusions and bibasilar opacities, left greater than right, atelectasis versus infiltrate.   Original Report Authenticated By: Jake Seats, MD    Recent Results (from the past 240 hour(s))  RESPIRATORY VIRUS PANEL     Status: Abnormal   Collection Time   07/22/12  3:07 PM      Component Value Range Status Comment   Source - RVPAN TRACHEAL ASPIRATE   Final    Respiratory Syncytial Virus A NOT DETECTED   Final    Respiratory Syncytial Virus B NOT DETECTED   Final    Influenza A DETECTED (*)  Final    Influenza B NOT DETECTED   Final    Parainfluenza 1  NOT DETECTED   Final    Parainfluenza 2 NOT DETECTED   Final    Parainfluenza 3 NOT DETECTED   Final    Metapneumovirus NOT DETECTED   Final    Rhinovirus NOT DETECTED   Final    Adenovirus NOT DETECTED   Final    Influenza A H1 NOT DETECTED   Final    Influenza A H3 NOT DETECTED   Final   CULTURE, BLOOD (ROUTINE X 2)     Status: Normal (Preliminary result)   Collection Time   07/27/12 10:05 PM      Component Value Range Status Comment   Specimen Description BLOOD RIGHT HAND   Final    Special Requests BOTTLES DRAWN AEROBIC ONLY 4CC   Final    Culture  Setup Time 07/28/2012 15:02   Final    Culture     Final    Value:        BLOOD CULTURE RECEIVED NO  GROWTH TO DATE CULTURE WILL BE HELD FOR 5 DAYS BEFORE ISSUING A FINAL NEGATIVE REPORT   Report Status PENDING   Incomplete   CULTURE, BLOOD (ROUTINE X 2)     Status: Normal (Preliminary result)   Collection Time   07/28/12 12:01 AM      Component Value Range Status Comment   Specimen Description BLOOD RIGHT HAND   Final    Special Requests BOTTLES DRAWN AEROBIC ONLY 6CC   Final    Culture  Setup Time 07/28/2012 15:02   Final    Culture     Final    Value:        BLOOD CULTURE RECEIVED NO GROWTH TO DATE CULTURE WILL BE HELD FOR 5 DAYS BEFORE ISSUING A FINAL NEGATIVE REPORT   Report Status PENDING   Incomplete   BODY FLUID CULTURE     Status: Normal (Preliminary result)   Collection Time   07/31/12 11:47 AM      Component Value Range Status Comment   Specimen Description PLEURAL FLUID LEFT   Final    Special Requests 3.0 ML FLUID   Final    Gram Stain     Final    Value: MODERATE WBC PRESENT,BOTH PMN AND MONONUCLEAR     NO ORGANISMS SEEN   Culture NO GROWTH 1 DAY   Final    Report Status PENDING   Incomplete       08/01/2012, 10:27 AM     LOS: 12 days

## 2012-08-02 ENCOUNTER — Inpatient Hospital Stay (HOSPITAL_COMMUNITY): Payer: BC Managed Care – PPO

## 2012-08-02 DIAGNOSIS — R569 Unspecified convulsions: Secondary | ICD-10-CM

## 2012-08-02 DIAGNOSIS — R259 Unspecified abnormal involuntary movements: Secondary | ICD-10-CM

## 2012-08-02 DIAGNOSIS — B953 Streptococcus pneumoniae as the cause of diseases classified elsewhere: Secondary | ICD-10-CM

## 2012-08-02 DIAGNOSIS — J111 Influenza due to unidentified influenza virus with other respiratory manifestations: Secondary | ICD-10-CM

## 2012-08-02 DIAGNOSIS — R7881 Bacteremia: Secondary | ICD-10-CM

## 2012-08-02 LAB — BASIC METABOLIC PANEL
CO2: 36 mEq/L — ABNORMAL HIGH (ref 19–32)
CO2: 36 mEq/L — ABNORMAL HIGH (ref 19–32)
Calcium: 8.1 mg/dL — ABNORMAL LOW (ref 8.4–10.5)
Calcium: 8.7 mg/dL (ref 8.4–10.5)
Chloride: 107 mEq/L (ref 96–112)
Creatinine, Ser: 1.45 mg/dL — ABNORMAL HIGH (ref 0.50–1.35)
Creatinine, Ser: 1.47 mg/dL — ABNORMAL HIGH (ref 0.50–1.35)
Glucose, Bld: 286 mg/dL — ABNORMAL HIGH (ref 70–99)
Glucose, Bld: 270 mg/dL — ABNORMAL HIGH (ref 70–99)

## 2012-08-02 LAB — POCT I-STAT 3, ART BLOOD GAS (G3+)
Acid-Base Excess: 9 mmol/L — ABNORMAL HIGH (ref 0.0–2.0)
Bicarbonate: 36.8 mEq/L — ABNORMAL HIGH (ref 20.0–24.0)
Patient temperature: 101.8
pH, Arterial: 7.27 — ABNORMAL LOW (ref 7.350–7.450)

## 2012-08-02 LAB — GLUCOSE, CAPILLARY
Glucose-Capillary: 202 mg/dL — ABNORMAL HIGH (ref 70–99)
Glucose-Capillary: 258 mg/dL — ABNORMAL HIGH (ref 70–99)
Glucose-Capillary: 273 mg/dL — ABNORMAL HIGH (ref 70–99)
Glucose-Capillary: 279 mg/dL — ABNORMAL HIGH (ref 70–99)

## 2012-08-02 LAB — TYPE AND SCREEN: ABO/RH(D): O POS

## 2012-08-02 LAB — BLOOD GAS, ARTERIAL
Drawn by: 364961
PEEP: 10 cmH2O
RATE: 26 resp/min
pCO2 arterial: 68 mmHg (ref 35.0–45.0)
pH, Arterial: 7.305 — ABNORMAL LOW (ref 7.350–7.450)
pO2, Arterial: 58.9 mmHg — ABNORMAL LOW (ref 80.0–100.0)

## 2012-08-02 LAB — CBC
HCT: 24.6 % — ABNORMAL LOW (ref 39.0–52.0)
HCT: 25.5 % — ABNORMAL LOW (ref 39.0–52.0)
MCH: 29.1 pg (ref 26.0–34.0)
MCHC: 30.6 g/dL (ref 30.0–36.0)
MCV: 96.9 fL (ref 78.0–100.0)
Platelets: 95 10*3/uL — ABNORMAL LOW (ref 150–400)
RBC: 2.54 MIL/uL — ABNORMAL LOW (ref 4.22–5.81)
RDW: 16.8 % — ABNORMAL HIGH (ref 11.5–15.5)
RDW: 16 % — ABNORMAL HIGH (ref 11.5–15.5)

## 2012-08-02 LAB — PATHOLOGIST SMEAR REVIEW

## 2012-08-02 LAB — ABO/RH: ABO/RH(D): O POS

## 2012-08-02 LAB — VANCOMYCIN, TROUGH: Vancomycin Tr: 17.3 ug/mL (ref 10.0–20.0)

## 2012-08-02 MED ORDER — SODIUM CHLORIDE 0.9 % IV SOLN
125.0000 mg | Freq: Three times a day (TID) | INTRAVENOUS | Status: DC
  Administered 2012-08-02 – 2012-08-06 (×12): 125 mg via INTRAVENOUS
  Filled 2012-08-02 (×24): qty 2.5

## 2012-08-02 MED ORDER — LORAZEPAM 2 MG/ML IJ SOLN
2.0000 mg | Freq: Once | INTRAMUSCULAR | Status: DC
  Filled 2012-08-02 (×5): qty 1

## 2012-08-02 MED ORDER — INSULIN GLARGINE 100 UNIT/ML ~~LOC~~ SOLN
30.0000 [IU] | SUBCUTANEOUS | Status: DC
  Administered 2012-08-02 – 2012-08-07 (×6): 30 [IU] via SUBCUTANEOUS

## 2012-08-02 MED ORDER — PHENYTOIN SODIUM 50 MG/ML IJ SOLN
1250.0000 mg | Freq: Once | INTRAMUSCULAR | Status: AC
  Administered 2012-08-02: 1250 mg via INTRAVENOUS
  Filled 2012-08-02: qty 25

## 2012-08-02 MED ORDER — LORAZEPAM 2 MG/ML IJ SOLN
1.0000 mg | Freq: Four times a day (QID) | INTRAMUSCULAR | Status: DC
  Administered 2012-08-02 – 2012-08-06 (×16): 1 mg via INTRAVENOUS
  Filled 2012-08-02 (×11): qty 1

## 2012-08-02 MED ORDER — LORAZEPAM 2 MG/ML IJ SOLN
INTRAMUSCULAR | Status: AC
  Filled 2012-08-02: qty 1

## 2012-08-02 MED ORDER — MAGNESIUM SULFATE 40 MG/ML IJ SOLN
2.0000 g | Freq: Once | INTRAMUSCULAR | Status: DC
  Filled 2012-08-02: qty 50

## 2012-08-02 MED ORDER — INSULIN ASPART 100 UNIT/ML ~~LOC~~ SOLN
0.0000 [IU] | SUBCUTANEOUS | Status: DC
  Administered 2012-08-02 (×2): 7 [IU] via SUBCUTANEOUS
  Administered 2012-08-02 (×2): 11 [IU] via SUBCUTANEOUS
  Administered 2012-08-03: 4 [IU] via SUBCUTANEOUS
  Administered 2012-08-03: 11 [IU] via SUBCUTANEOUS
  Administered 2012-08-03: 7 [IU] via SUBCUTANEOUS
  Administered 2012-08-03: 3 [IU] via SUBCUTANEOUS
  Administered 2012-08-03: 7 [IU] via SUBCUTANEOUS
  Administered 2012-08-04: 4 [IU] via SUBCUTANEOUS
  Administered 2012-08-04 – 2012-08-10 (×18): 3 [IU] via SUBCUTANEOUS

## 2012-08-02 MED ORDER — LORAZEPAM 2 MG/ML IJ SOLN
2.0000 mg | Freq: Once | INTRAMUSCULAR | Status: AC
  Administered 2012-08-02: 2 mg via INTRAVENOUS

## 2012-08-02 MED ORDER — SODIUM CHLORIDE 0.45 % IV SOLN
INTRAVENOUS | Status: DC
  Administered 2012-08-02 – 2012-08-05 (×2): via INTRAVENOUS

## 2012-08-02 NOTE — Procedures (Signed)
The #1 chest tube placed in the ICU on 1/30 was pulled back 5cm.  The patient was prepped and 7cc lidocaine was administered subcutaneously at the chest tube site. The patient was then draped in sterile fashion. The #1 chest tube was pulled back 5cm, just beyond the 8cm mark. It was then securely fastened using 0 silk sutures. The chest tube site was then re-dressed and the tube was put back to suction.  Blood loss: None

## 2012-08-02 NOTE — Progress Notes (Signed)
Trach Team Note  Pt with new trach, still on ventilator and sedated. Not yet appropriate for speaking valve. Will continue to follow via chart. Thanks, Herbie [place], [place] CCC-SLP [phone]

## 2012-08-02 NOTE — Consult Note (Signed)
Reason for Consult:Seizure Referring Physician: Titus Mould  CC: Seizure X 2  HPI: [name] is an 72 y.o. male with a history of HTN, CHF admitted to [facility] on 1/18 on transfer from [name] ER with sepsis and ARDS secondary to influenza A/H1N1 multifocal pneumonia. Patient has had multiple medical issues including hypernatremia, hyperphosphatemia. renal insufficiency, elevated LFT's and hypotension.  Noted earlier today to have a seizure documented as shaking of the upper extremities with upward gaze.  Was noted this afternoon to have another event described as jerking of the left shoulder and arm.  Patient intubated and sedated.  Has remained febrile but wbc count is improving.  Metabolic status is improving as well. Has been hypotensive requiring pressors.     Past Medical History  Diagnosis Date  . Hypertension   . Hyperlipidemia   . OSA (obstructive sleep apnea)     on cpap  . Prediabetes     Past surgical history: Patient intubated and sedated.  Unable to provide.  Family history: Patient intubated and sedated.  Unable to provide.  Social History:  reports that he has been smoking Cigarettes.  He has been smoking about 2 packs per day. He does not have any smokeless tobacco history on file. He reports that he does not drink alcohol or use illicit drugs.  Allergies  Allergen Reactions  . Cephalosporins Rash    Medications:  I have reviewed the patient's current medications. Prior to Admission:  Prescriptions prior to admission  Medication Sig Dispense Refill  . amLODipine (NORVASC) 5 MG tablet Take 5 mg by mouth daily.      Marland Kitchen atorvastatin (LIPITOR) 20 MG tablet Take 20 mg by mouth daily.      Marland Kitchen doxazosin (CARDURA) 8 MG tablet Take 8 mg by mouth daily.      . hydrochlorothiazide (HYDRODIURIL) 25 MG tablet Take 25 mg by mouth daily.      Marland Kitchen ibuprofen (ADVIL,MOTRIN) 200 MG tablet Take 800 mg by mouth every 6 (six) hours as needed. For pain      . losartan (COZAAR) 100 MG  tablet Take 100 mg by mouth daily.      . metoprolol succinate (TOPROL-XL) 50 MG 24 hr tablet Take 50 mg by mouth daily. Take with or immediately following a meal.      . niacin (NIASPAN) 500 MG CR tablet Take 500 mg by mouth 2 (two) times daily.      . pantoprazole (PROTONIX) 40 MG tablet Take 40 mg by mouth daily.      . potassium chloride SA (K-DUR,KLOR-CON) 20 MEQ tablet Take 20 mEq by mouth daily.      Marland Kitchen spironolactone (ALDACTONE) 25 MG tablet Take 25 mg by mouth daily.       Scheduled:   . antiseptic oral rinse  15 mL Mouth Rinse QID  . aspirin  81 mg Per Tube Daily  . chlorhexidine  15 mL Mouth Rinse BID  . clindamycin (CLEOCIN) IV  300 mg Intravenous Q8H  . feeding supplement (OXEPA)  1,000 mL Per Tube Q24H  . feeding supplement  60 mL Per Tube TID  . free water  400 mL Per Tube Q4H  . hydrocortisone sod succinate (SOLU-CORTEF) injection  50 mg Intravenous Q6H  . insulin aspart  0-20 Units Subcutaneous Q4H  . insulin glargine  30 Units Subcutaneous Q24H  . LORazepam  1 mg Intravenous Q6H  . LORazepam  2 mg Intravenous Once  . magnesium sulfate 1 - 4 g bolus  IVPB  2 g Intravenous Once  . multivitamin  5 mL Per Tube Daily  . pantoprazole sodium  40 mg Per Tube Daily  . phenytoin (DILANTIN) IV  125 mg Intravenous Q8H  . propofol  5-70 mcg/kg/min Intravenous Once  . risperiDONE  1 mg Oral BID  . vancomycin  1,250 mg Intravenous Q24H    ROS: Unable to obtain  Physical Examination: Blood pressure 132/65, pulse 83, temperature 99.7 F (37.6 C), temperature source Oral, resp. rate 23, height 5\' 7"  (1.702 m), weight 109.2 kg (240 lb 11.9 oz), SpO2 93.00%.  Neurologic Examination Mental Status: Patient does not respond to verbal stimuli.  With light stimulation grimaces.  Does not follow commands.  No verbalizations noted.  Cranial Nerves: II: patient does not respond confrontation bilaterally, pupils right 1 mm, left 1 mm,and sluggishly reactive bilaterally III,IV,VI: doll's  response absent bilaterally.  V,VII: corneal reflex present bilaterally  VIII: patient does not respond to verbal stimuli IX,X: gag reflex reduced, XI: trapezius strength unable to test bilaterally XII: tongue strength unable to test Motor: No spontaneous movement noted but withdrawal responses noted in all extremities.  With stimulation noted to have jerking at the left shoulder and arm that is self limited.  No purposeful movements noted. Sensory: Responds to noxious stimuli in all extremities. Deep Tendon Reflexes:  1+ throughout with absent AJ's bilaterally Plantars: mute bilaterally Cerebellar: Unable to perform  Laboratory Studies:   Basic Metabolic Panel:  Lab [id number] 08/02/12 0420 08/01/12 1900 08/01/12 0425 07/31/12 1622 07/31/12 0400 07/30/12 0346 07/29/12 0430  NA 140 145 148* 154* 155* -- -- --  K 4.6 4.5 4.6 4.4 4.4 -- -- --  CL 100 107 107 111 112 -- -- --  CO2 36* 36* 38* 39* 38* -- -- --  GLUCOSE 270* 286* 222* 148* 211* -- -- --  BUN 111* 105* 111* 119* 118* -- -- --  CREATININE 1.47* 1.45* 1.63* 1.54* 1.51* -- -- --  CALCIUM 8.7 8.1* 8.6 -- -- -- -- --  MG -- 1.9 -- 2.0 -- 2.0 2.2 2.1  PHOS -- 5.2* -- 4.4 -- 3.5 4.6 6.2*    Liver Function Tests:  Lab 07/31/12 1622  AST 42*  ALT 56*  ALKPHOS 59  BILITOT 0.4  PROT 5.6*  ALBUMIN 1.6*   No results found for this basename: LIPASE:5,AMYLASE:5 in the last 168 hours No results found for this basename: AMMONIA:3 in the last 168 hours  CBC:  Lab 08/02/12 0420 08/01/12 1900 08/01/12 1105 08/01/12 0425 07/31/12 0400  WBC 6.6 7.7 10.6* 10.7* 14.8*  NEUTROABS -- -- -- -- --  HGB 7.4* 8.1* 8.0* 8.5* 9.6*  HCT 24.6* 26.9* 27.3* 29.2* 32.7*  MCV 96.9 97.1 99.3 100.7* 99.7  PLT 95* 102* 115* 112* 130*    Cardiac Enzymes: No results found for this basename: CKTOTAL:5,CKMB:5,CKMBINDEX:5,TROPONINI:5 in the last 168 hours  BNP: No components found with this basename: POCBNP:5  CBG:  Lab 08/02/12 2026  08/02/12 1614 08/02/12 1220 08/02/12 0746 08/02/12 0356  GLUCAP 279* 244* 238* 273* 258*    Microbiology: Results for orders placed during the hospital encounter of 07/20/12  CULTURE, BLOOD (ROUTINE X 2)     Status: Normal   Collection Time   07/20/12  7:15 PM      Component Value Range Status Comment   Specimen Description BLOOD LEFT HAND   Final    Special Requests BOTTLES DRAWN AEROBIC AND ANAEROBIC 10CC   Final    Culture  Setup Time 07/21/2012 02:14   Final    Culture NO GROWTH 5 DAYS   Final    Report Status 07/27/2012 FINAL   Final   CULTURE, BLOOD (ROUTINE X 2)     Status: Normal   Collection Time   07/20/12  7:30 PM      Component Value Range Status Comment   Specimen Description BLOOD LEFT HAND   Final    Special Requests BOTTLES DRAWN AEROBIC AND ANAEROBIC 10CC   Final    Culture  Setup Time 07/21/2012 02:14   Final    Culture NO GROWTH 5 DAYS   Final    Report Status 07/27/2012 FINAL   Final   URINE CULTURE     Status: Normal   Collection Time   07/20/12  9:00 PM      Component Value Range Status Comment   Specimen Description URINE, CATHETERIZED   Final    Special Requests NONE   Final    Culture  Setup Time 07/21/2012 02:56   Final    Colony Count NO GROWTH   Final    Culture NO GROWTH   Final    Report Status 07/22/2012 FINAL   Final   CULTURE, RESPIRATORY     Status: Normal   Collection Time   07/21/12  6:44 AM      Component Value Range Status Comment   Specimen Description TRACHEAL ASPIRATE   Final    Special Requests NONE   Final    Gram Stain     Final    Value: ABUNDANT WBC PRESENT, PREDOMINANTLY PMN     NO SQUAMOUS EPITHELIAL CELLS SEEN     RARE GRAM POSITIVE COCCI IN PAIRS   Culture NO GROWTH 2 DAYS   Final    Report Status 07/23/2012 FINAL   Final   RESPIRATORY VIRUS PANEL     Status: Abnormal   Collection Time   07/22/12  3:07 PM      Component Value Range Status Comment   Source - RVPAN TRACHEAL ASPIRATE   Final    Respiratory Syncytial Virus A  NOT DETECTED   Final    Respiratory Syncytial Virus B NOT DETECTED   Final    Influenza A DETECTED (*)  Final    Influenza B NOT DETECTED   Final    Parainfluenza 1 NOT DETECTED   Final    Parainfluenza 2 NOT DETECTED   Final    Parainfluenza 3 NOT DETECTED   Final    Metapneumovirus NOT DETECTED   Final    Rhinovirus NOT DETECTED   Final    Adenovirus NOT DETECTED   Final    Influenza A H1 NOT DETECTED   Final    Influenza A H3 NOT DETECTED   Final   CULTURE, BLOOD (ROUTINE X 2)     Status: Normal (Preliminary result)   Collection Time   07/27/12 10:05 PM      Component Value Range Status Comment   Specimen Description BLOOD RIGHT HAND   Final    Special Requests BOTTLES DRAWN AEROBIC ONLY 4CC   Final    Culture  Setup Time 07/28/2012 15:02   Final    Culture     Final    Value:        BLOOD CULTURE RECEIVED NO GROWTH TO DATE CULTURE WILL BE HELD FOR 5 DAYS BEFORE ISSUING A FINAL NEGATIVE REPORT   Report Status PENDING   Incomplete   CULTURE, BLOOD (ROUTINE X  2)     Status: Normal (Preliminary result)   Collection Time   07/28/12 12:01 AM      Component Value Range Status Comment   Specimen Description BLOOD RIGHT HAND   Final    Special Requests BOTTLES DRAWN AEROBIC ONLY 6CC   Final    Culture  Setup Time 07/28/2012 15:02   Final    Culture     Final    Value:        BLOOD CULTURE RECEIVED NO GROWTH TO DATE CULTURE WILL BE HELD FOR 5 DAYS BEFORE ISSUING A FINAL NEGATIVE REPORT   Report Status PENDING   Incomplete   BODY FLUID CULTURE     Status: Normal (Preliminary result)   Collection Time   07/31/12 11:47 AM      Component Value Range Status Comment   Specimen Description PLEURAL FLUID LEFT   Final    Special Requests 3.0 ML FLUID   Final    Gram Stain     Final    Value: MODERATE WBC PRESENT,BOTH PMN AND MONONUCLEAR     NO ORGANISMS SEEN   Culture NO GROWTH 2 DAYS   Final    Report Status PENDING   Incomplete   CULTURE, RESPIRATORY     Status: Normal (Preliminary result)    Collection Time   08/01/12  3:20 PM      Component Value Range Status Comment   Specimen Description TRACHEAL ASPIRATE   Final    Special Requests NONE   Final    Gram Stain     Final    Value: FEW WBC PRESENT,BOTH PMN AND MONONUCLEAR     NO ORGANISMS SEEN   Culture NO GROWTH 1 DAY   Final    Report Status PENDING   Incomplete   BODY FLUID CULTURE     Status: Normal (Preliminary result)   Collection Time   08/01/12  6:31 PM      Component Value Range Status Comment   Specimen Description PLEURAL FLUID LEFT   Final    Special Requests NONE   Final    Gram Stain     Final    Value: FEW WBC PRESENT,BOTH PMN AND MONONUCLEAR     NO ORGANISMS SEEN   Culture PENDING   Incomplete    Report Status PENDING   Incomplete   CLOSTRIDIUM DIFFICILE BY PCR     Status: Normal   Collection Time   08/01/12  7:45 PM      Component Value Range Status Comment   C difficile by pcr NEGATIVE  NEGATIVE Final     Coagulation Studies: No results found for this basename: LABPROT:5,INR:5 in the last 72 hours  Urinalysis: No results found for this basename: COLORURINE:2,APPERANCEUR:2,LABSPEC:2,PHURINE:2,GLUCOSEU:2,HGBUR:2,BILIRUBINUR:2,KETONESUR:2,PROTEINUR:2,UROBILINOGEN:2,NITRITE:2,LEUKOCYTESUR:2 in the last 168 hours  Lipid Panel:  No results found for this basename: chol, trig, hdl, cholhdl, vldl, ldlcalc    HgbA1C:  Lab Results  Component Value Date   HGBA1C 6.4* 07/24/2012    Urine Drug Screen:   No results found for this basename: labopia, cocainscrnur, labbenz, amphetmu, thcu, labbarb    Alcohol Level: No results found for this basename: ETH:2 in the last 168 hours  Imaging: Ct Guided Abscess Drain  08/02/2012  *RADIOLOGY REPORT*  Clinical Data/Indication: LEFT PLEURAL EFFUSION  CT GUIDED ABCESS DRAINAGE WITH CATHETER FOR LEFT THORACOSTOMY.  Procedure: The procedure, risks, benefits, and alternatives were explained to the patient. Questions regarding the procedure were encouraged and  answered. The patient understands and consents to  the procedure.  The left back was prepped with betadine in a sterile fashion, and a sterile drape was applied covering the operative field. A sterile gown and sterile gloves were used for the procedure.  Under CT guidance, an 18 gauge needle was inserted into the left pleural effusion.  It was removed over an Amplatz.  A 20-French Thal-Quik drain was inserted and coiled in the fluid collection. It was sewn in place then attached to 20 cm wall suction.  60 ml was aspirated and sent for culture.  It was yellow and turbid.  Findings: Initial scout imaging through the left chest demonstrates a left thoracostomy.  The tube traverses into the left upper lobe. The tip is positioned adjacent to the left atrium. Please refer to sagittal reconstruction imaging for details.  Subsequent images demonstrate placement of a posterior 20-French left thoracostomy tube position within a loculated left pleural effusion.  Complications: None.  IMPRESSION: Successful 20-French left thoracostomy for loculated left pleural effusion.  The original chest tube traverses the left upper lobe parenchyma.  Critical Value/emergent results were called by telephone at the time of interpretation on 08/02/2012 at 8:20 hours to [facility], who verbally acknowledged these results.   Original Report Authenticated By: Marybelle Killings, M.D.    Dg Chest Port 1 View  08/02/2012  *RADIOLOGY REPORT*  Clinical Data: Acute respiratory failure, hypoxia, retracted left chest tube  PORTABLE CHEST - 1 VIEW  Comparison: 08/02/2012  Findings: Slight retraction of one of the left chest tubes.  The side port of the chest tube projects over the chest wall and may be outside the thoracic cavity.  Other support apparatus stable.  Monitor leads overlie the chest. Stable extensive diffuse airspace process versus edema or ARDS throughout both lungs.  No interval change in aeration.  Stable pleural effusions.  No pneumothorax.   IMPRESSION: Interval retraction of one of the left chest tubes.  No pneumothorax  Stable exam   Original Report Authenticated By: Jerilynn Mages. Annamaria Boots, M.D.    Dg Chest Port 1 View  08/02/2012  *RADIOLOGY REPORT*  Clinical Data: Chest tube placement.  Intubated patient.  PORTABLE CHEST - 1 VIEW  Comparison: Chest x-ray 08/01/2012.  Findings: A tracheostomy tube is in place with tip 5.2 cm above the carina.  There is a left-sided subclavian central venous catheter with tip terminating in the left innominate vein. Nasogastric tube is seen extending to the level of the gastroesophageal junction, and side port in the mid esophagus.  Previously noted left-sided chest tube is similarly positioned with tip directed toward the medial aspect of the lower left hemithorax.  There has been interval placement of an additional left-sided chest tube which is curled in the lower left hemithorax.  Lung volumes remain low. There are extensive bibasilar opacities which may reflect areas of atelectasis and/or consolidation superimposed moderate bilateral pleural effusions.  There is cephalization of the pulmonary vasculature, indistinctness of the interstitial markings, and patchy airspace disease throughout the lungs bilaterally suggestive of moderate pulmonary edema.  Cardiac silhouette is largely obscured. The patient is rotated to the left on today's exam, resulting in distortion of the mediastinal contours and reduced diagnostic sensitivity and specificity for mediastinal pathology. Atherosclerosis in the thoracic aorta.  IMPRESSION: 1.  Support apparatus, as above. 2.  Persistent background of moderate pulmonary edema with bibasilar atelectasis and/or consolidation with superimposed moderate bilateral pleural effusions.   Original Report Authenticated By: Vinnie Langton, M.D.    Chest Portable 1 View To Assess Tube Placement  And Rule-out Pneumothorax  08/01/2012  *RADIOLOGY REPORT*  Clinical Data: Tracheostomy tube placement.   PORTABLE CHEST - 1 VIEW  Comparison: 08/01/2012 11:04 a.m.  Findings: Endotracheal tube removed.  Tracheostomy tube placed with the tip projecting slightly to the left of midline.  Left central line tip unchanged and may be within the left subclavian vein.  Left-sided chest tube in place.  No gross pneumothorax.  Left-sided pleural thickening remains.  Diffuse asymmetric air space disease relatively similar to the prior exam.  Cardiomegaly.  Calcified tortuous aorta.  IMPRESSION: Endotracheal tube removed.  Tracheostomy tube placed with the tip projecting slightly to the left of midline.  Left central line tip position unchanged and may be within the left subclavian vein.  Left-sided chest tube in place.  No gross pneumothorax.  Left-sided pleural thickening remains.  Diffuse asymmetric air space disease relatively similar to the prior exam.  Cardiomegaly.   Original Report Authenticated By: Genia Del, M.D.    Dg Chest Port 1 View  08/01/2012  *RADIOLOGY REPORT*  Clinical Data: Chest tube.  PORTABLE CHEST - 1 VIEW  Comparison: 08/01/2012.  Findings: Endotracheal tube tip is 7.4 cm from the carina.  There is a new left basilar thoracostomy tube.  There is no pneumothorax identified.  The left subclavian line and enteric tube appear unchanged.  Evacuation of some of the left pleural effusion with improving aeration of the left lung compared to recent priors. Right pleural effusion remains present.  Bilateral airspace disease and atelectasis.  Loculated pleural fluid is present along the left mid chest.  IMPRESSION: New left basilar thoracostomy tube.  No pneumothorax.  Evacuation of some of the left pleural effusion. Other support apparatus unchanged.   Original Report Authenticated By: Dereck Ligas, M.D.    Dg Chest Port 1 View  08/01/2012  *RADIOLOGY REPORT*  Clinical Data: Intubated patient.  Edema.  Shortness of breath.  PORTABLE CHEST - 1 VIEW  Comparison: Chest x-ray 07/31/2012.  Findings: An  endotracheal tube is in place with tip 6.4 cm above the carina. A nasogastric tube is seen extending into the stomach, however, the tip of the nasogastric tube extends below the lower margin of the image. There is a left-sided subclavian central venous catheter with tip terminating in the distal left innominate vein. Extensive bibasilar opacities may reflect areas of atelectasis and/or consolidation. There is cephalization of the pulmonary vasculature and slight indistinctness of the interstitial markings suggestive of mild pulmonary edema.  Mild cardiomegaly. Moderate bilateral pleural effusions.  Atherosclerosis in the thoracic aorta.  IMPRESSION: 1.  Allowing for slight differences in patient positioning, the radiographic appearance of the chest is essentially unchanged, as above.   Original Report Authenticated By: Vinnie Langton, M.D.      Assessment/Plan: 72 year old male with multiple medical problems noted to have two seizures today.  Patient without a history of seizures.  Intubated and sedated.  Initial episode may have been a seizure but the second more focal episodes are stimulus induced and do not unequivocally represent seizure activity.  Patient has been started on Dilantin.  No further bilateral jerking activity noted. Head CT performed on 1/27 unremarkable.  Lab work improving.  Patient's sepsis being addressed and wbc count improving.  Patient has been hypotensive though and can not rule out intracerebral injury related to this.     Recommendations: 1.  Would continue Dilantin for now.  Level in AM 2.  EEG 3.  Head CT   Alexis Goodell, MD [facility]  [phone] 08/02/2012, 8:41 PM

## 2012-08-02 NOTE — Progress Notes (Signed)
PULMONARY  / CRITICAL CARE MEDICINE  Name: [name] MRN: [id number] DOB: 07/21/1940    LOS: 17  REFERRING MD :  [name]  CHIEF COMPLAINT:  Respiratory failure and pneumonia  BRIEF PATIENT DESCRIPTION: 43 yoM with HTN, CHF admitted to [facility] on 1/18 on transfer from [facility] ER with sepsis and ARDS secondary to influenza A/H1N1 multifocal pneumonia.   LINES / TUBES: L. CVL 1/18 >>  ETT 1/18 >> Left Chest tube 1/30 >>> Left Pigtail 1/30 >>>  CULTURES: [facility] [phone]): 1/18 Blood culture x 2 >> strep pneumoniae in 2 of 2 pansenstive   [place]: 1/18 BC >> NG 1/18 UC >> negative 1/18 Flu PCR >> Influenza A/ H1N1 1/18 lactate >> 2.3  1/19 PCT >> 8 >> 6.5 1/19 Resp: rare GPC in pairs. No growth at 2 days.   1/25 BC x2: NGTD  ANTIBIOTICS: 1/18 Vanc >> 1/23 restarted 1/25 >>> 1/30 Clindamycin >>> 1/18 Levaquin >> 1/23 1/18 Tamiflu >> planned end date 1/28 1/18 Cefepime >>1/23 1/23 Rocephin 1/23>>> 1/23 1/24 Levofloxacin >>> 1/27  SIGNIFICANT EVENTS / studies:  1/18 Transfer to [facility] from [place] for respiratory failure started on Nimbex protocol for SEVERE ARDS.  1/27 ct chest- free flowing rt effusion small, partial loculated effusion left small 1/29 diagnostic left thora, exudative 1/30 fevers continued, chest tube placement with bleeding.  Not draining well so pigtail placed by IR. trached 1/30 pigtail  Placed chest  LEVEL OF CARE:  ICU PRIMARY SERVICE:  PCCM CONSULTANTS:  None CODE STATUS: Full DIET:  Tube feeds DVT Px:  SCDs GI Px:  Protonix  HISTORY OF PRESENT ILLNESS:  72 yo male with known hx of HTN, CHF presented to [facility] ER on 1/18 w/ 1 week hx of cough , congestion w/ progressively worsening dyspnea . Hypoxia in ER , decompensated requiring intubation . CXR w/ multilobar PNA . Hypotension unresponsive to fluid challenged requiring pressors. CCM asked to accept at [facility] ICU 1/18. Transported via [facility] . Renal failure w/ scr ~4 .  Received 6L + in ER.   SUBJECTIVE / INTERVAL HISTORY:  Intubated and sedated.  UOP good overnight.  Fever curve coming down. Tolerated trach well yesterday.  Chest tube placed at bedside yesterday with minimal drainage.  CT guided pigtail catheter placed by IR draining well with 300 ml out since placement.    VITAL SIGNS: Temp:  [100 F (37.8 C)-101.8 F (38.8 C)] 100 F (37.8 C) (01/30 2345) Pulse Rate:  [81-111] 81  (01/31 0600) Resp:  [13-24] 21  (01/31 0600) BP: (72-133)/(38-96) 110/55 mmHg (01/31 0600) SpO2:  [71 %-100 %] 94 % (01/31 0600) FiO2 (%):  [59.3 %-100 %] 60 % (01/31 0328) Weight:  [240 lb 11.9 oz (109.2 kg)] 240 lb 11.9 oz (109.2 kg) (01/31 0415) HEMODYNAMICS: CVP:  [5 mmHg-6 mmHg] 6 mmHg VENTILATOR SETTINGS: Vent Mode:  [-] PRVC FiO2 (%):  [59.3 %-100 %] 60 % Set Rate:  [18 bmp-20 bmp] 20 bmp Vt Set:  [400 mL] 400 mL PEEP:  [5 cmH20-10 cmH20] 10 cmH20 Plateau Pressure:  [13 cmH20-20 cmH20] 18 cmH20 INTAKE / OUTPUT: Intake/Output      01/30 0701 - 01/31 0700 01/31 0701 - 02/01 0700   I.V. (mL/kg) 3220.4 (29.5)    Blood 330    Other     NG/GT 2390    IV Piggyback 850    Total Intake(mL/kg) 6790.4 (62.2)    Urine (mL/kg/hr) 1555 (0.6)    Stool  Chest Tube 380    Total Output 1935    Net +4855.4          PHYSICAL EXAMINATION: General: Intubated and sedated, opens eyes to stimulation but does not track to voice.   Head: Normocephalic, atraumatic, right eye with corneal scratch and scleral hemorrhage. Trach site with minimal bleeding and no erythema.   Lungs:  Normal respiratory effort. Mild bronchial breath sounds bilaterally.   Heart: RRR, no MRG.   Abdomen:  BS normal. Soft, nondistended, non-tender.   Extremities: 3+ pitting edema in the bilateral upper and lower extremities.    Skin: Confluent rash over the back, lower abdomen, and dorsal arms with several areas of blistering and skin breakdown.  No pus drainage. Appears stable from yesterday.      IMAGING Chest Portable 1 View To Assess Tube Placement And Rule-out Pneumothorax  08/01/2012  *RADIOLOGY REPORT*  Clinical Data: Tracheostomy tube placement.  PORTABLE CHEST - 1 VIEW  Comparison: 08/01/2012 11:04 a.m.  Findings: Endotracheal tube removed.  Tracheostomy tube placed with the tip projecting slightly to the left of midline.  Left central line tip unchanged and may be within the left subclavian vein.  Left-sided chest tube in place.  No gross pneumothorax.  Left-sided pleural thickening remains.  Diffuse asymmetric air space disease relatively similar to the prior exam.  Cardiomegaly.  Calcified tortuous aorta.  IMPRESSION: Endotracheal tube removed.  Tracheostomy tube placed with the tip projecting slightly to the left of midline.  Left central line tip position unchanged and may be within the left subclavian vein.  Left-sided chest tube in place.  No gross pneumothorax.  Left-sided pleural thickening remains.  Diffuse asymmetric air space disease relatively similar to the prior exam.  Cardiomegaly.   Original Report Authenticated By: Genia Del, M.D.    Dg Chest Port 1 View  08/01/2012  *RADIOLOGY REPORT*  Clinical Data: Chest tube.  PORTABLE CHEST - 1 VIEW  Comparison: 08/01/2012.  Findings: Endotracheal tube tip is 7.4 cm from the carina.  There is a new left basilar thoracostomy tube.  There is no pneumothorax identified.  The left subclavian line and enteric tube appear unchanged.  Evacuation of some of the left pleural effusion with improving aeration of the left lung compared to recent priors. Right pleural effusion remains present.  Bilateral airspace disease and atelectasis.  Loculated pleural fluid is present along the left mid chest.  IMPRESSION: New left basilar thoracostomy tube.  No pneumothorax.  Evacuation of some of the left pleural effusion. Other support apparatus unchanged.   Original Report Authenticated By: Dereck Ligas, M.D.    Dg Chest Port 1 View  08/01/2012   *RADIOLOGY REPORT*  Clinical Data: Intubated patient.  Edema.  Shortness of breath.  PORTABLE CHEST - 1 VIEW  Comparison: Chest x-ray 07/31/2012.  Findings: An endotracheal tube is in place with tip 6.4 cm above the carina. A nasogastric tube is seen extending into the stomach, however, the tip of the nasogastric tube extends below the lower margin of the image. There is a left-sided subclavian central venous catheter with tip terminating in the distal left innominate vein. Extensive bibasilar opacities may reflect areas of atelectasis and/or consolidation. There is cephalization of the pulmonary vasculature and slight indistinctness of the interstitial markings suggestive of mild pulmonary edema.  Mild cardiomegaly. Moderate bilateral pleural effusions.  Atherosclerosis in the thoracic aorta.  IMPRESSION: 1.  Allowing for slight differences in patient positioning, the radiographic appearance of the chest is essentially unchanged, as above.  Original Report Authenticated By: Vinnie Langton, M.D.    Dg Chest Port 1 View  07/31/2012  *RADIOLOGY REPORT*  Clinical Data: Post thoracentesis.  PORTABLE CHEST - 1 VIEW  Comparison: 03/31/2013  Findings: 1200 hours.  Endotracheal tube tip is 5.2 cm above the base of the carina.  No evidence for pneumothorax status post thoracentesis.  There is bibasilar atelectasis/infiltrate. Probable associated small bilateral pleural effusions. Cardiopericardial silhouette is at upper limits of normal for size. Left subclavian central line tip overlies the central mediastinum, cranial to the carina. Telemetry leads overlie the chest.  IMPRESSION: No evidence for pneumothorax status post thoracentesis.   Original Report Authenticated By: Misty Stanley, M.D.    DIAGNOSES: Active Problems:  Acute respiratory failure with hypoxia  ARDS (adult respiratory distress syndrome)  Acute renal failure  Basic Metabolic Panel:  Basename 08/02/12 0420 08/01/12 1900 08/01/12 0425  NA 145  148* --  K 4.5 4.6 --  CL 107 107 --  CO2 36* 38* --  GLUCOSE 286* 222* --  BUN 105* 111* --  CREATININE 1.45* 1.63* --  CALCIUM 8.1* 8.6 --  MG 1.9 -- 2.0  PHOS 5.2* -- 4.4   Liver Function Tests:  [facility] 07/31/12 1622  AST 42*  ALT 56*  ALKPHOS 59  BILITOT 0.4  PROT 5.6*  ALBUMIN 1.6*   CBC:  Basename 08/02/12 0420 08/01/12 1900  WBC 6.6 7.7  NEUTROABS -- --  HGB 7.4* 8.1*  HCT 24.6* 26.9*  MCV 96.9 97.1  PLT 95* 102*   CBG:  Basename 08/02/12 0356 08/02/12 0021 08/01/12 2021 08/01/12 1845 08/01/12 1221 08/01/12 0825  GLUCAP 258* 202* 187* 197* 101* 113*   Misc. Labs: LDH blood: 201  Thoracocentesis labs: Glucose 142 LDH 6732 PH 9.0 WBC: 2600 Diff: 90% Neutrophils GS: no organisms seen.   ASSESSMENT / PLAN:  PULMONARY  Lab 07/31/12 0757 07/30/12 0801 07/29/12 1427  PHART 7.406 7.415 7.347*  PCO2ART 68.0* 68.5* 79.1*  PO2ART 54.0* 60.0* 66.0*  HCO3 42.5* 43.9* 43.2*  TCO2 44 46 46   A:  1) Multilobar PNA - respiratory viral panel shows influenza only.  flu PCR positive for H1N1. sputum culture from [place] showed S. Pneumo.  [facility] sputum culture shows GPC in pairs which is likely S. pneumo but there was no growth in the final culture. Treating for flu and S. pneumo. CT of chest shows a moderate partially loculated left pleural effusion and a small right pleural effusion that appears free flowing.  Diagnostic thoracocentesis showed probably empyema.    2) Influenza A/ H1N1 infection: Positive flu PCR and now resp viral panel shows no other viruses.   3) Severe ARDS - Able to maintain off nimbex after a long period on.  Oxygenation stable on FiO2 60% and PEEP 10.   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 5) exudative pleural effusion: Lkely empyema Chest tube placed by IR after bedside attempt, draining well with fever course coming down and WBC count coming down.   ID consulted to assist with antibiotics and duration.  They suggested continuing Vanc and adding clindamycin.    P:   - See ID. - completed 10 days of [place]  - Continue to wean per ARDS protocol - unable to reduce O2 needs, increased rate - Check ABG in am  - Limit bronchodilators in ards - trach wnl - see ID for CT evaluation- fo chest tube - Monitor  chest tube output, consider D/C of the large bore chest tube. Likely in fissure , but had significant bleeding frmo this, will back it out 4-5 cm to ensure no ptx air noted.  CARDIOVASCULAR  A:  1) Sepsis - 2/2 multilobar PNA, influenza A/ H1N1.  Febrile for the last few days still though WBC count trending downward.  Levo requirement higher then yesterday may be related to blood loss yesterday vs sepsis now that empyema is tapped.   2) Elevated troponins - question of demand ischemia vs NSTEMI , no further therapy recommedned 3) AAA - 6.4 cm per CT Abd 1/19.  Will continue to monitor unless developing hypotension or hemodynamic compromise. 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  P:  - Monitor AAA for now, low threshold for vascular consult if worsening hemodynamically will monitor for now - Continue aspirin 81mg  daily. - Levo started, titrate to MAP of 65 for now. -Add vaso in setting continued levo needs , related to empyema  Cbc in am   RENAL  Lab 08/02/12 0420 08/01/12 1900 08/01/12 0425  NA 145 148* 154*  K 4.5 4.6 4.4  CL 107 107 111  CO2 36* 38* 39*  BUN 105* 111* 119*  CREATININE 1.45* 1.63* 1.54*  GLUCOSE 286* 222* 148*   A:  1) Acute renal failure  - ATN in setting of hypotension and volume depletion in setting of sepsis, improving currently.  CVP stable 11-13.  UOP brisk without lasix yesterday again.  Creatinine improving and BUN down 2. Hyperphosphatemia: Improving with resolution of #1 2) Hypokalemia - repleted today 3) Hypernatremia: Na down to 145 this AM!   Large free water deficit  finally corrected.    P:  - Continue to monitor lytes daily  -hold T F - free water to 400 q4  - D/C D5  - Bmet in the AM.   GASTROINTESTINAL  A: No acute issues. Having BM.  P:  - Continue tube feeds after trach - ppi -needs PEG, ordered  HEMATOLOGIC  Lab 08/02/12 0420 08/01/12 1900 08/01/12 1105 07/29/12 1225  WBC 6.6 7.7 10.6* --  HGB 7.4* 8.1* 8.0* --  HCT 24.6* 26.9* 27.3* --  PLT 95* 102* 115* --  APTT -- -- -- 35  INR -- -- -- 1.38   A:  1) Acute normocytic anemia - likely related to critical illness, unknown baseline. FOBT positive yesterday.  HgB decreased today likely related to the two procedures yesterday.  HgB 7.4.  Got 1 unit of blood yesterday (chest tube) and will transfuse again if HgB <7. INR stable  2) Thrombocytopenia - unknown baseline, low normal in 05/2008. No obvious source of bleed. Likely related to critical illness vs bleeding yesterday.  Will stop Heparin today.  3) hemoconcetration P:  - Continue to monitor CBC with pos balance, for dilution, only mild changes as of now - sub q hep holding - coags reviewed -will back out chest tube eval bloody drainage / ptx -evaluate hgb from pleaural space  INFECTIOUS  Lab 08/02/12 0420 08/01/12 1900 08/01/12 1105  WBC 6.6 7.7 10.6*  NEUTROABS -- -- --  LATICACIDVEN -- 1.4 --  PROCALCITON -- -- --   A:  1) Sepsis secondary to multilobar PNA and influenza 2) S. Pneumonia bacteremia - 2/2 blood cultures positive with S. Pneumo, pan sensitive from [place].  BC negative here at [facility]. Resp culture from [facility] showing [place] in pairs but no growth on final culture.  Narrowed to Levaquin then to Vanc.  Today (1/31) is day 14 of antibiotics. Continued to have fevers over several days so a thoracocentesis was performed that showed an exudative effusion that is likely empyema.  Chest tube placed yesterday and now fever curve trending downward.  ID consulted and added Clindamycin and will extend the duration of antibiotics.   3. Drug rash with fever: likely secondary to Cephalosporins.  Several areas of blistering noted but does not appear to be Steven-johnson's  4. Leukocytosis - continues to improve.  Chest tube placement yesterday draining well with decrease in the fever curve.    P:  - Cortisol of 33, stress steroids stopped.   - Vanc currently given sensitivities from blood culture from [facility]  - Add Clindamycin 1/30 - Likely duration of antibiotics 4-6 weeks. - completed Tamiflu day 10/10. - Restart stress roids - Follow ever curve.   ENDOCRINE / RHEUMATOLOGIC  Lab 08/02/12 0356 08/02/12 0021 08/01/12 2021 08/01/12 1845 08/01/12 1221  GLUCAP 258* 202* 187* 197* 101*   A: 1) Prediabetes - blood sugars stable elevated with increased D5 drip.  Better controlled today but still getting significant SSI so will increased lantus again.  P:  - ICU hyperglycemia protocol.  - increase Lantus to 30 units now that we start - In goal 140-180 -pressors, maintain stress steroids  NEUROLOGIC / PSYCHIATRIC RAAS Goal: -3 CAM-ICU: Cannot assess     A: 1) Acute encephalopathy - in setting of sepsis vs now hypernatremia vs Uremia. Hypernatremia corrected now and BUN continues to fall with resolution of ATN.  2. 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  Scleral hemorrhage today so may need to consult ophtho today.  P:  - Continue with RAAS goal of -1 to -2, fent only - intermittent Versed - add Risperdal in hopes to avoid versed  CLINICAL SUMMARY: 75 yoM with HTN, CHF admitted to [facility] MICU on transfer from [facility] on 07/20/2012 secondary to sepsis in setting  of influenza A/ H1N1 / multilobar PNA. Intubated in ED secondary to hypoxic respiratory failure. Has ARDS on the protocol nimbex 1/19 to 1/24.  Started empirically on broad spectrum ABx on admission and narrowed to Vanc and Clinda for empyema total duration of 4 weeks.  Drug fever/rash with Cephalosporins though continuing longer  then expected, Chest tube placed (first with adhesion bleeding likely) and draining.  Tolerated trach well.  Remains on pressors, goal to add vaso, back out chest tube but not remove.  Trish Fountain, MD Internal Medicine Resident, PGY III Co-Chief Resident, Internal Medicine Pager: [phone] 08/02/2012 7:03 AM   Ccm time 30 min  I have fully examined this patient and agree with above findings.    And edited in full Lavon Paganini. Titus Mould, MD, [place] Pgr: [facility]

## 2012-08-02 NOTE — Progress Notes (Signed)
Student note reviewed and agree with assessment/plan.  Uvaldo Rising, BCPS  Clinical Pharmacist Pager [phone]  08/02/2012 2:29 PM

## 2012-08-02 NOTE — Progress Notes (Signed)
ANTIBIOTIC CONSULT NOTE - FOLLOW UP  Pharmacy Consult for vancomycin  Indication: strep PNA bacteremia and empyema  Allergies  Allergen Reactions  . Cephalosporins Rash    Patient Measurements: Height: 5\' 7"  (170.2 cm) Weight: 240 lb 11.9 oz (109.2 kg) IBW/kg (Calculated) : 66.1  Adjusted Body Weight:   Vital Signs: Temp: 100.1 F (37.8 C) (01/31 2100) Temp src: Rectal (01/31 2100) BP: 120/61 mmHg (01/31 2245) Pulse Rate: 89  (01/31 2245) Intake/Output from previous day: 01/30 0701 - 01/31 0700 In: 7407.3 [I.V.:3697.3; Blood:330; NG/GT:2480; IV [id number] Out: 2605 [Urine:1880; Stool:200; Chest Tube:525] Intake/Output from this shift: Total I/O In: 682.4 [I.V.:162.4; NG/GT:120; IV Piggyback:400] Out: 125 [Urine:125]  Labs:  [facility] 08/02/12 2022 08/02/12 1635 08/02/12 0420 08/01/12 1900  WBC 9.2 -- 6.6 7.7  HGB 7.8* -- 7.4* 8.1*  PLT 103* -- 95* 102*  LABCREA -- -- -- --  CREATININE -- 1.47* 1.45* 1.63*   Estimated Creatinine Clearance: 55.1 ml/min (by C-G formula based on Cr of 1.47).  Basename 08/02/12 2116  VANCOTROUGH 17.3  VANCOPEAK --  Jake Michaelis --  GENTTROUGH --  GENTPEAK --  GENTRANDOM --  TOBRATROUGH --  TOBRAPEAK --  TOBRARND --  [place] --  AMIKACIN --     Microbiology: Recent Results (from the past 720 hour(s))  CULTURE, BLOOD (ROUTINE X 2)     Status: Normal   Collection Time   07/20/12  7:15 PM      Component Value Range Status Comment   Specimen Description BLOOD LEFT HAND   Final    Special Requests BOTTLES DRAWN AEROBIC AND ANAEROBIC 10CC   Final    Culture  Setup Time 07/21/2012 02:14   Final    Culture NO GROWTH 5 DAYS   Final    Report Status 07/27/2012 FINAL   Final   CULTURE, BLOOD (ROUTINE X 2)     Status: Normal   Collection Time   07/20/12  7:30 PM      Component Value Range Status Comment   Specimen Description BLOOD LEFT HAND   Final    Special Requests BOTTLES DRAWN AEROBIC AND ANAEROBIC  10CC   Final    Culture  Setup Time 07/21/2012 02:14   Final    Culture NO GROWTH 5 DAYS   Final    Report Status 07/27/2012 FINAL   Final   URINE CULTURE     Status: Normal   Collection Time   07/20/12  9:00 PM      Component Value Range Status Comment   Specimen Description URINE, CATHETERIZED   Final    Special Requests NONE   Final    Culture  Setup Time 07/21/2012 02:56   Final    Colony Count NO GROWTH   Final    Culture NO GROWTH   Final    Report Status 07/22/2012 FINAL   Final   CULTURE, RESPIRATORY     Status: Normal   Collection Time   07/21/12  6:44 AM      Component Value Range Status Comment   Specimen Description TRACHEAL ASPIRATE   Final    Special Requests NONE   Final    Gram Stain     Final    Value: ABUNDANT WBC PRESENT, PREDOMINANTLY PMN     NO SQUAMOUS EPITHELIAL CELLS SEEN     RARE GRAM POSITIVE COCCI IN PAIRS   Culture NO GROWTH 2 DAYS   Final    Report Status 07/23/2012 FINAL   Final  RESPIRATORY VIRUS PANEL     Status: Abnormal   Collection Time   07/22/12  3:07 PM      Component Value Range Status Comment   Source - RVPAN TRACHEAL ASPIRATE   Final    Respiratory Syncytial Virus A NOT DETECTED   Final    Respiratory Syncytial Virus B NOT DETECTED   Final    Influenza A DETECTED (*)  Final    Influenza B NOT DETECTED   Final    Parainfluenza 1 NOT DETECTED   Final    Parainfluenza 2 NOT DETECTED   Final    Parainfluenza 3 NOT DETECTED   Final    Metapneumovirus NOT DETECTED   Final    Rhinovirus NOT DETECTED   Final    Adenovirus NOT DETECTED   Final    Influenza A H1 NOT DETECTED   Final    Influenza A H3 NOT DETECTED   Final   CULTURE, BLOOD (ROUTINE X 2)     Status: Normal (Preliminary result)   Collection Time   07/27/12 10:05 PM      Component Value Range Status Comment   Specimen Description BLOOD RIGHT HAND   Final    Special Requests BOTTLES DRAWN AEROBIC ONLY 4CC   Final    Culture  Setup Time 07/28/2012 15:02   Final    Culture      Final    Value:        BLOOD CULTURE RECEIVED NO GROWTH TO DATE CULTURE WILL BE HELD FOR 5 DAYS BEFORE ISSUING A FINAL NEGATIVE REPORT   Report Status PENDING   Incomplete   CULTURE, BLOOD (ROUTINE X 2)     Status: Normal (Preliminary result)   Collection Time   07/28/12 12:01 AM      Component Value Range Status Comment   Specimen Description BLOOD RIGHT HAND   Final    Special Requests BOTTLES DRAWN AEROBIC ONLY 6CC   Final    Culture  Setup Time 07/28/2012 15:02   Final    Culture     Final    Value:        BLOOD CULTURE RECEIVED NO GROWTH TO DATE CULTURE WILL BE HELD FOR 5 DAYS BEFORE ISSUING A FINAL NEGATIVE REPORT   Report Status PENDING   Incomplete   BODY FLUID CULTURE     Status: Normal (Preliminary result)   Collection Time   07/31/12 11:47 AM      Component Value Range Status Comment   Specimen Description PLEURAL FLUID LEFT   Final    Special Requests 3.0 ML FLUID   Final    Gram Stain     Final    Value: MODERATE WBC PRESENT,BOTH PMN AND MONONUCLEAR     NO ORGANISMS SEEN   Culture NO GROWTH 2 DAYS   Final    Report Status PENDING   Incomplete   CULTURE, RESPIRATORY     Status: Normal (Preliminary result)   Collection Time   08/01/12  3:20 PM      Component Value Range Status Comment   Specimen Description TRACHEAL ASPIRATE   Final    Special Requests NONE   Final    Gram Stain     Final    Value: FEW WBC PRESENT,BOTH PMN AND MONONUCLEAR     NO ORGANISMS SEEN   Culture NO GROWTH 1 DAY   Final    Report Status PENDING   Incomplete   BODY FLUID CULTURE  Status: Normal (Preliminary result)   Collection Time   08/01/12  6:31 PM      Component Value Range Status Comment   Specimen Description PLEURAL FLUID LEFT   Final    Special Requests NONE   Final    Gram Stain     Final    Value: FEW WBC PRESENT,BOTH PMN AND MONONUCLEAR     NO ORGANISMS SEEN   Culture PENDING   Incomplete    Report Status PENDING   Incomplete   CLOSTRIDIUM DIFFICILE BY PCR     Status: Normal    Collection Time   08/01/12  7:45 PM      Component Value Range Status Comment   C difficile by pcr NEGATIVE  NEGATIVE Final     Anti-infectives     Start     Dose/Rate Route Frequency Ordered Stop   08/01/12 1400   clindamycin (CLEOCIN) IVPB 300 mg        300 mg 100 mL/hr over 30 Minutes Intravenous 3 times per day 08/01/12 1109     07/30/12 2300   vancomycin (VANCOCIN) 1,250 mg in sodium chloride 0.9 % 250 mL IVPB        1,250 mg 166.7 mL/hr over 90 Minutes Intravenous Every 24 hours 07/30/12 1109     07/28/12 2300   vancomycin (VANCOCIN) IVPB 1000 mg/200 mL premix  Status:  Discontinued        1,000 mg 200 mL/hr over 60 Minutes Intravenous Every 24 hours 07/27/12 2142 07/30/12 1129   07/27/12 2215   vancomycin (VANCOCIN) 1,250 mg in sodium chloride 0.9 % 250 mL IVPB        1,250 mg 166.7 mL/hr over 90 Minutes Intravenous NOW 07/27/12 2142 07/27/12 2355   07/26/12 2200   levofloxacin (LEVAQUIN) IVPB 750 mg  Status:  Discontinued        750 mg 100 mL/hr over 90 Minutes Intravenous Every 48 hours 07/26/12 0940 07/29/12 1337   07/25/12 1500   cefTRIAXone (ROCEPHIN) 2 g in dextrose 5 % 50 mL IVPB  Status:  Discontinued        2 g 100 mL/hr over 30 Minutes Intravenous Every 24 hours 07/25/12 1401 07/26/12 0921   07/23/12 1300   vancomycin (VANCOCIN) 1,250 mg in sodium chloride 0.9 % 250 mL IVPB  Status:  Discontinued        1,250 mg 166.7 mL/hr over 90 Minutes Intravenous Every 24 hours 07/23/12 1158 07/25/12 1356   07/22/12 1000   oseltamivir (TAMIFLU) 6 MG/ML suspension 150 mg        150 mg Per Tube Daily 07/21/12 1103 07/30/12 1034   07/22/12 0530   vancomycin (VANCOCIN) 1,500 mg in sodium chloride 0.9 % 500 mL IVPB  Status:  Discontinued        1,500 mg 250 mL/hr over 120 Minutes Intravenous Every 48 hours 07/22/12 0525 07/23/12 1158   07/21/12 1000   oseltamivir (TAMIFLU) capsule 150 mg  Status:  Discontinued        150 mg Oral Daily 07/21/12 0936 07/21/12 1103    07/20/12 2300   levofloxacin (LEVAQUIN) IVPB 750 mg  Status:  Discontinued        750 mg 100 mL/hr over 90 Minutes Intravenous Every 48 hours 07/20/12 1847 07/25/12 1356   07/20/12 2200   oseltamivir (TAMIFLU) capsule 75 mg  Status:  Discontinued        75 mg Oral 2 times daily 07/20/12 1824 07/21/12 0936   07/20/12 2100  vancomycin (VANCOCIN) 2,000 mg in sodium chloride 0.9 % 500 mL IVPB        2,000 mg 250 mL/hr over 120 Minutes Intravenous  Once 07/20/12 1847 07/20/12 2300   07/20/12 2000   ceFEPIme (MAXIPIME) 1 g in dextrose 5 % 50 mL IVPB  Status:  Discontinued        1 g 100 mL/hr over 30 Minutes Intravenous Every 24 hours 07/20/12 1847 07/25/12 1356          Assessment: vanc trough is 17.3  On 1250 mg q24h  Goal of Therapy:  Vancomycin trough level 15-20 mcg/ml  Plan:  Continue at current dose.  Curlene Dolphin 08/02/2012,11:19 PM

## 2012-08-02 NOTE — Progress Notes (Signed)
INFECTIOUS DISEASE PROGRESS NOTE  ID: [name] is a 72 y.o. male with  Active Problems:  Acute respiratory failure with hypoxia  ARDS (adult respiratory distress syndrome)  Acute renal failure  Subjective: On vent, no response  Abtx:  Anti-infectives     Start     Dose/Rate Route Frequency Ordered Stop   08/01/12 1400   clindamycin (CLEOCIN) IVPB 300 mg        300 mg 100 mL/hr over 30 Minutes Intravenous 3 times per day 08/01/12 1109     07/30/12 2300   vancomycin (VANCOCIN) 1,250 mg in sodium chloride 0.9 % 250 mL IVPB        1,250 mg 166.7 mL/hr over 90 Minutes Intravenous Every 24 hours 07/30/12 1109     07/28/12 2300   vancomycin (VANCOCIN) IVPB 1000 mg/200 mL premix  Status:  Discontinued        1,000 mg 200 mL/hr over 60 Minutes Intravenous Every 24 hours 07/27/12 2142 07/30/12 1129   07/27/12 2215   vancomycin (VANCOCIN) 1,250 mg in sodium chloride 0.9 % 250 mL IVPB        1,250 mg 166.7 mL/hr over 90 Minutes Intravenous NOW 07/27/12 2142 07/27/12 2355   07/26/12 2200   levofloxacin (LEVAQUIN) IVPB 750 mg  Status:  Discontinued        750 mg 100 mL/hr over 90 Minutes Intravenous Every 48 hours 07/26/12 0940 07/29/12 1337   07/25/12 1500   cefTRIAXone (ROCEPHIN) 2 g in dextrose 5 % 50 mL IVPB  Status:  Discontinued        2 g 100 mL/hr over 30 Minutes Intravenous Every 24 hours 07/25/12 1401 07/26/12 0921   07/23/12 1300   vancomycin (VANCOCIN) 1,250 mg in sodium chloride 0.9 % 250 mL IVPB  Status:  Discontinued        1,250 mg 166.7 mL/hr over 90 Minutes Intravenous Every 24 hours 07/23/12 1158 07/25/12 1356   07/22/12 1000   oseltamivir (TAMIFLU) 6 MG/ML suspension 150 mg        150 mg Per Tube Daily 07/21/12 1103 07/30/12 1034   07/22/12 0530   vancomycin (VANCOCIN) 1,500 mg in sodium chloride 0.9 % 500 mL IVPB  Status:  Discontinued        1,500 mg 250 mL/hr over 120 Minutes Intravenous Every 48 hours 07/22/12 0525 07/23/12 1158   07/21/12 1000    oseltamivir (TAMIFLU) capsule 150 mg  Status:  Discontinued        150 mg Oral Daily 07/21/12 0936 07/21/12 1103   07/20/12 2300   levofloxacin (LEVAQUIN) IVPB 750 mg  Status:  Discontinued        750 mg 100 mL/hr over 90 Minutes Intravenous Every 48 hours 07/20/12 1847 07/25/12 1356   07/20/12 2200   oseltamivir (TAMIFLU) capsule 75 mg  Status:  Discontinued        75 mg Oral 2 times daily 07/20/12 1824 07/21/12 0936   07/20/12 2100   vancomycin (VANCOCIN) 2,000 mg in sodium chloride 0.9 % 500 mL IVPB        2,000 mg 250 mL/hr over 120 Minutes Intravenous  Once 07/20/12 1847 07/20/12 2300   07/20/12 2000   ceFEPIme (MAXIPIME) 1 g in dextrose 5 % 50 mL IVPB  Status:  Discontinued        1 g 100 mL/hr over 30 Minutes Intravenous Every 24 hours 07/20/12 1847 07/25/12 1356          Medications:  Scheduled:   . antiseptic oral rinse  15 mL Mouth Rinse QID  . aspirin  81 mg Per Tube Daily  . chlorhexidine  15 mL Mouth Rinse BID  . clindamycin (CLEOCIN) IV  300 mg Intravenous Q8H  . feeding supplement (OXEPA)  1,000 mL Per Tube Q24H  . feeding supplement  60 mL Per Tube TID  . free water  400 mL Per Tube Q4H  . hydrocortisone sod succinate (SOLU-CORTEF) injection  50 mg Intravenous Q6H  . insulin aspart  0-20 Units Subcutaneous Q4H  . insulin glargine  30 Units Subcutaneous Q24H  . magnesium sulfate 1 - 4 g bolus IVPB  2 g Intravenous Once  . multivitamin  5 mL Per Tube Daily  . pantoprazole sodium  40 mg Per Tube Daily  . propofol  5-70 mcg/kg/min Intravenous Once  . risperiDONE  1 mg Oral BID  . vancomycin  1,250 mg Intravenous Q24H    Objective: Vital signs in last 24 hours: Temp:  [100 F (37.8 C)-101.8 F (38.8 C)] 100 F (37.8 C) (01/30 2345) Pulse Rate:  [81-111] 81  (01/31 0600) Resp:  [13-24] 21  (01/31 0600) BP: (72-133)/(38-96) 110/55 mmHg (01/31 0600) SpO2:  [71 %-100 %] 94 % (01/31 0600) FiO2 (%):  [59.3 %-100 %] 60 % (01/31 0328) Weight:  [109.2 kg (240  lb 11.9 oz)] 109.2 kg (240 lb 11.9 oz) (01/31 0415)   General appearance: on vent Resp: diminished breath sounds bilaterally Cardio: regular rate and rhythm GI: abnormal findings:  hypoactive bowel sounds and soft Extremities: edema 2-3+ ankle  Lab Results  Basename 08/02/12 0420 08/01/12 1900  WBC 6.6 7.7  HGB 7.4* 8.1*  HCT 24.6* 26.9*  NA 145 148*  K 4.5 4.6  CL 107 107  CO2 36* 38*  BUN 105* 111*  CREATININE 1.45* 1.63*  GLU -- --   Liver Panel  Basename 07/31/12 1622  PROT 5.6*  ALBUMIN 1.6*  AST 42*  ALT 56*  ALKPHOS 59  BILITOT 0.4  BILIDIR 0.1  IBILI 0.3   Sedimentation Rate No results found for this basename: ESRSEDRATE in the last 72 hours C-Reactive Protein No results found for this basename: CRP:2 in the last 72 hours  Microbiology: Recent Results (from the past 240 hour(s))  CULTURE, BLOOD (ROUTINE X 2)     Status: Normal (Preliminary result)   Collection Time   07/27/12 10:05 PM      Component Value Range Status Comment   Specimen Description BLOOD RIGHT HAND   Final    Special Requests BOTTLES DRAWN AEROBIC ONLY 4CC   Final    Culture  Setup Time 07/28/2012 15:02   Final    Culture     Final    Value:        BLOOD CULTURE RECEIVED NO GROWTH TO DATE CULTURE WILL BE HELD FOR 5 DAYS BEFORE ISSUING A FINAL NEGATIVE REPORT   Report Status PENDING   Incomplete   CULTURE, BLOOD (ROUTINE X 2)     Status: Normal (Preliminary result)   Collection Time   07/28/12 12:01 AM      Component Value Range Status Comment   Specimen Description BLOOD RIGHT HAND   Final    Special Requests BOTTLES DRAWN AEROBIC ONLY [facility]   Final    Culture  Setup Time 07/28/2012 15:02   Final    Culture     Final    Value:        BLOOD CULTURE RECEIVED NO  GROWTH TO DATE CULTURE WILL BE HELD FOR 5 DAYS BEFORE ISSUING A FINAL NEGATIVE REPORT   Report Status PENDING   Incomplete   BODY FLUID CULTURE     Status: Normal (Preliminary result)   Collection Time   07/31/12 11:47 AM       Component Value Range Status Comment   Specimen Description PLEURAL FLUID LEFT   Final    Special Requests 3.0 ML FLUID   Final    Gram Stain     Final    Value: MODERATE WBC PRESENT,BOTH PMN AND MONONUCLEAR     NO ORGANISMS SEEN   Culture NO GROWTH 1 DAY   Final    Report Status PENDING   Incomplete   CULTURE, RESPIRATORY     Status: Normal (Preliminary result)   Collection Time   08/01/12  3:20 PM      Component Value Range Status Comment   Specimen Description TRACHEAL ASPIRATE   Final    Special Requests NONE   Final    Gram Stain     Final    Value: FEW WBC PRESENT,BOTH PMN AND MONONUCLEAR     NO ORGANISMS SEEN   Culture PENDING   Incomplete    Report Status PENDING   Incomplete   BODY FLUID CULTURE     Status: Normal (Preliminary result)   Collection Time   08/01/12  6:31 PM      Component Value Range Status Comment   Specimen Description PLEURAL FLUID LEFT   Final    Special Requests NONE   Final    Gram Stain     Final    Value: FEW WBC PRESENT,BOTH PMN AND MONONUCLEAR     NO ORGANISMS SEEN   Culture PENDING   Incomplete    Report Status PENDING   Incomplete   CLOSTRIDIUM DIFFICILE BY PCR     Status: Normal   Collection Time   08/01/12  7:45 PM      Component Value Range Status Comment   C difficile by pcr NEGATIVE  NEGATIVE Final     Studies/Results: Chest Portable 1 View To Assess Tube Placement And Rule-out Pneumothorax  08/01/2012  *RADIOLOGY REPORT*  Clinical Data: Tracheostomy tube placement.  PORTABLE CHEST - 1 VIEW  Comparison: 08/01/2012 11:04 a.m.  Findings: Endotracheal tube removed.  Tracheostomy tube placed with the tip projecting slightly to the left of midline.  Left central line tip unchanged and may be within the left subclavian vein.  Left-sided chest tube in place.  No gross pneumothorax.  Left-sided pleural thickening remains.  Diffuse asymmetric air space disease relatively similar to the prior exam.  Cardiomegaly.  Calcified tortuous aorta.   IMPRESSION: Endotracheal tube removed.  Tracheostomy tube placed with the tip projecting slightly to the left of midline.  Left central line tip position unchanged and may be within the left subclavian vein.  Left-sided chest tube in place.  No gross pneumothorax.  Left-sided pleural thickening remains.  Diffuse asymmetric air space disease relatively similar to the prior exam.  Cardiomegaly.   Original Report Authenticated By: Genia Del, M.D.    Dg Chest Port 1 View  08/01/2012  *RADIOLOGY REPORT*  Clinical Data: Chest tube.  PORTABLE CHEST - 1 VIEW  Comparison: 08/01/2012.  Findings: Endotracheal tube tip is 7.4 cm from the carina.  There is a new left basilar thoracostomy tube.  There is no pneumothorax identified.  The left subclavian line and enteric tube appear unchanged.  Evacuation of some of the  left pleural effusion with improving aeration of the left lung compared to recent priors. Right pleural effusion remains present.  Bilateral airspace disease and atelectasis.  Loculated pleural fluid is present along the left mid chest.  IMPRESSION: New left basilar thoracostomy tube.  No pneumothorax.  Evacuation of some of the left pleural effusion. Other support apparatus unchanged.   Original Report Authenticated By: Dereck Ligas, M.D.    Dg Chest Port 1 View  08/01/2012  *RADIOLOGY REPORT*  Clinical Data: Intubated patient.  Edema.  Shortness of breath.  PORTABLE CHEST - 1 VIEW  Comparison: Chest x-ray 07/31/2012.  Findings: An endotracheal tube is in place with tip 6.4 cm above the carina. A nasogastric tube is seen extending into the stomach, however, the tip of the nasogastric tube extends below the lower margin of the image. There is a left-sided subclavian central venous catheter with tip terminating in the distal left innominate vein. Extensive bibasilar opacities may reflect areas of atelectasis and/or consolidation. There is cephalization of the pulmonary vasculature and slight indistinctness  of the interstitial markings suggestive of mild pulmonary edema.  Mild cardiomegaly. Moderate bilateral pleural effusions.  Atherosclerosis in the thoracic aorta.  IMPRESSION: 1.  Allowing for slight differences in patient positioning, the radiographic appearance of the chest is essentially unchanged, as above.   Original Report Authenticated By: Vinnie Langton, M.D.    Dg Chest Port 1 View  07/31/2012  *RADIOLOGY REPORT*  Clinical Data: Post thoracentesis.  PORTABLE CHEST - 1 VIEW  Comparison: 03/31/2013  Findings: 1200 hours.  Endotracheal tube tip is 5.2 cm above the base of the carina.  No evidence for pneumothorax status post thoracentesis.  There is bibasilar atelectasis/infiltrate. Probable associated small bilateral pleural effusions. Cardiopericardial silhouette is at upper limits of normal for size. Left subclavian central line tip overlies the central mediastinum, cranial to the carina. Telemetry leads overlie the chest.  IMPRESSION: No evidence for pneumothorax status post thoracentesis.   Original Report Authenticated By: Misty Stanley, M.D.      Assessment/Plan: Empyema (chest tube out 525 yesterday) Pneumococcal bacteremia Influenza Rash (ceftriaxone?) ARF  Day 13 anbx (clinda/vanco)  Temp better this AM Cr better PLT slightly worse Empyema fluid from 1-29 and 1-30 pending.  Plan for short course of clinda (3-5 days), watch output from chest tube  Bobby Rumpf Infectious Diseases [id number] 08/02/2012, 7:42 AM   LOS: 13 days

## 2012-08-02 NOTE — Progress Notes (Signed)
Portable EEG completed

## 2012-08-02 NOTE — Progress Notes (Signed)
Inpatient Diabetes Program Recommendations  AACE/ADA: New Consensus Statement on Inpatient Glycemic Control (2013)  Target Ranges:  Prepandial:   less than 140 mg/dL      Peak postprandial:   less than 180 mg/dL (1-2 hours)      Critically ill patients:  140 - 180 mg/dL   Reason for Visit: Results for [name], [name] (MRN [id number]) as of 08/02/2012 10:17  Ref. Range 08/01/2012 20:21 08/02/2012 00:21 08/02/2012 03:56 08/02/2012 07:46  Glucose-Capillary Latest Range: 70-99 mg/dL 187 (H) 202 (H) 258 (H) 273 (H)   Note CBG's are greater than goal.  Consider ICU glycemic control protocol.  Please add IV insulin if CBG's continue greater than 200 mg/dL. Will follow.

## 2012-08-02 NOTE — Progress Notes (Signed)
Panic ABG pH: 7.30, CO2: 68 and O2: 58 called to Dr [name]. Oder for RT to make ventilator changes and repead ABG one hour after.

## 2012-08-02 NOTE — Progress Notes (Signed)
ANTIBIOTIC and AntiSeizure CONSULT NOTE - Follow up  Pharmacy Consult for Vancomycin/Phenytoin Indication:  Strep pneumo bacteremia / seizure  Allergies  Allergen Reactions  . Cephalosporins Rash    Patient Measurements: Height: 5\' 7"  (170.2 cm) Weight: 240 lb 11.9 oz (109.2 kg) IBW/kg (Calculated) : 66.1   Vital Signs: Temp: 99.2 F (37.3 C) (01/31 1253) Temp src: Oral (01/31 1253) BP: 108/55 mmHg (01/31 1345) Pulse Rate: 80  (01/31 1345) Intake/Output from previous day: 01/30 0701 - 01/31 0700 In: 7407.3 [I.V.:3697.3; Blood:330; NG/GT:2480; IV [id number] Out: 2605 [Urine:1880; Stool:200; Chest Tube:525]  Labs:  [facility] 08/02/12 0420 08/01/12 1900 08/01/12 1105 08/01/12 0425  WBC 6.6 7.7 10.6* --  HGB 7.4* 8.1* 8.0* --  PLT 95* 102* 115* --  LABCREA -- -- -- --  CREATININE 1.45* 1.63* -- 1.54*  Estimated Creatinine Clearance: 41.6 ml/min (by C-G formula based on Cr of 1.91).  Microbiology: Recent Results (from the past 720 hour(s))  CULTURE, BLOOD (ROUTINE X 2)     Status: Normal   Collection Time   07/20/12  7:15 PM      Component Value Range Status Comment   Specimen Description BLOOD LEFT HAND   Final    Special Requests BOTTLES DRAWN AEROBIC AND ANAEROBIC 10CC   Final    Culture  Setup Time 07/21/2012 02:14   Final    Culture NO GROWTH 5 DAYS   Final    Report Status 07/27/2012 FINAL   Final   CULTURE, BLOOD (ROUTINE X 2)     Status: Normal   Collection Time   07/20/12  7:30 PM      Component Value Range Status Comment   Specimen Description BLOOD LEFT HAND   Final    Special Requests BOTTLES DRAWN AEROBIC AND ANAEROBIC 10CC   Final    Culture  Setup Time 07/21/2012 02:14   Final    Culture NO GROWTH 5 DAYS   Final    Report Status 07/27/2012 FINAL   Final   URINE CULTURE     Status: Normal   Collection Time   07/20/12  9:00 PM      Component Value Range Status Comment   Specimen Description URINE, CATHETERIZED   Final    Special Requests NONE    Final    Culture  Setup Time 07/21/2012 02:56   Final    Colony Count NO GROWTH   Final    Culture NO GROWTH   Final    Report Status 07/22/2012 FINAL   Final   CULTURE, RESPIRATORY     Status: Normal   Collection Time   07/21/12  6:44 AM      Component Value Range Status Comment   Specimen Description TRACHEAL ASPIRATE   Final    Special Requests NONE   Final    Gram Stain     Final    Value: ABUNDANT WBC PRESENT, PREDOMINANTLY PMN     NO SQUAMOUS EPITHELIAL CELLS SEEN     RARE GRAM POSITIVE COCCI IN PAIRS   Culture NO GROWTH 2 DAYS   Final    Report Status 07/23/2012 FINAL   Final   RESPIRATORY VIRUS PANEL     Status: Abnormal   Collection Time   07/22/12  3:07 PM      Component Value Range Status Comment   Source - RVPAN TRACHEAL ASPIRATE   Final    Respiratory Syncytial Virus A NOT DETECTED   Final    Respiratory Syncytial Virus B NOT  DETECTED   Final    Influenza A DETECTED (*)  Final    Influenza B NOT DETECTED   Final    Parainfluenza 1 NOT DETECTED   Final    Parainfluenza 2 NOT DETECTED   Final    Parainfluenza 3 NOT DETECTED   Final    Metapneumovirus NOT DETECTED   Final    Rhinovirus NOT DETECTED   Final    Adenovirus NOT DETECTED   Final    Influenza A H1 NOT DETECTED   Final    Influenza A H3 NOT DETECTED   Final   CULTURE, BLOOD (ROUTINE X 2)     Status: Normal (Preliminary result)   Collection Time   07/27/12 10:05 PM      Component Value Range Status Comment   Specimen Description BLOOD RIGHT HAND   Final    Special Requests BOTTLES DRAWN AEROBIC ONLY 4CC   Final    Culture  Setup Time 07/28/2012 15:02   Final    Culture     Final    Value:        BLOOD CULTURE RECEIVED NO GROWTH TO DATE CULTURE WILL BE HELD FOR 5 DAYS BEFORE ISSUING A FINAL NEGATIVE REPORT   Report Status PENDING   Incomplete   CULTURE, BLOOD (ROUTINE X 2)     Status: Normal (Preliminary result)   Collection Time   07/28/12 12:01 AM      Component Value Range Status Comment   Specimen  Description BLOOD RIGHT HAND   Final    Special Requests BOTTLES DRAWN AEROBIC ONLY 6CC   Final    Culture  Setup Time 07/28/2012 15:02   Final    Culture     Final    Value:        BLOOD CULTURE RECEIVED NO GROWTH TO DATE CULTURE WILL BE HELD FOR 5 DAYS BEFORE ISSUING A FINAL NEGATIVE REPORT   Report Status PENDING   Incomplete   BODY FLUID CULTURE     Status: Normal (Preliminary result)   Collection Time   07/31/12 11:47 AM      Component Value Range Status Comment   Specimen Description PLEURAL FLUID LEFT   Final    Special Requests 3.0 ML FLUID   Final    Gram Stain     Final    Value: MODERATE WBC PRESENT,BOTH PMN AND MONONUCLEAR     NO ORGANISMS SEEN   Culture NO GROWTH 2 DAYS   Final    Report Status PENDING   Incomplete   CULTURE, RESPIRATORY     Status: Normal (Preliminary result)   Collection Time   08/01/12  3:20 PM      Component Value Range Status Comment   Specimen Description TRACHEAL ASPIRATE   Final    Special Requests NONE   Final    Gram Stain     Final    Value: FEW WBC PRESENT,BOTH PMN AND MONONUCLEAR     NO ORGANISMS SEEN   Culture NO GROWTH 1 DAY   Final    Report Status PENDING   Incomplete   BODY FLUID CULTURE     Status: Normal (Preliminary result)   Collection Time   08/01/12  6:31 PM      Component Value Range Status Comment   Specimen Description PLEURAL FLUID LEFT   Final    Special Requests NONE   Final    Gram Stain     Final    Value: FEW WBC  PRESENT,BOTH PMN AND MONONUCLEAR     NO ORGANISMS SEEN   Culture PENDING   Incomplete    Report Status PENDING   Incomplete   CLOSTRIDIUM DIFFICILE BY PCR     Status: Normal   Collection Time   08/01/12  7:45 PM      Component Value Range Status Comment   C difficile by pcr NEGATIVE  NEGATIVE Final      Assessment: Patient is a 42 yoM currently on vancomycin per pharmacy for S.pneumo bacteremia and empyema. Positive blood cultures at [facility] for Strep pneumo - pan sensitive. Blood cultures at  [facility] have no growth to date. After ID consult, clindamycin added for anaerobic coverage of emypema.  Current plan is to continue vancomycin and clindamycin for ~4-6 weeks of therapy.    WBC 6.6 improved, remains afebrile today with Tmax of 101.8 in last 24hrs.   SCr 1.45 today - steadily improving  Urine output is good at 0.56ml/kg/min  Vanc trough scheduled for 1/31 @ 2300  Patient has seizure today during procedure to reposition chest tube. Pharmacy consulted to manage phenytoin. Load dose of 1250mg  IV given 1/31. MD wants to continue with maintenance dosing. Based on adjusted body weight of 83kg, phenytoin 125mg  IV TID has been scheduled.   Goal of Therapy:  Vancomycin trough level 15-20 mcg/ml No seizure activity  Plan:  1. Continue Vancomycin 1250 mg IV q24h. Adjust as necessary after VT tonight. 2. Monitor renal function and clinical status including temperature.  3. Continue phenytoin 125mg  IV TID. Follow up with planned LOT. 4. Monitor for s/s of seizure activity and adjust dosing as necessary.  Allene Dillon, PharmD Candidate 08/02/2012  2:23 PM

## 2012-08-02 NOTE — Progress Notes (Signed)
CRITICAL CARE RESIDENT NOTE Interim Progress Note    RESULT INTERVENTION TAKEN  1. Mag 1.9 MAg 2 g   2.    3.      Signed: Cresenciano Genre, MD  PGY-1 Internal Medicine Resident Pager: [phone] (7PM-7AM) 08/02/2012, 5:50 AM

## 2012-08-02 NOTE — Progress Notes (Signed)
Subjective: Left chest tube placed 1/30 (#2) On vent 1st chest tube intact; pulled back 5 cm by Dr Titus Mould today Bloody from CT #1 Blood tinged from CT #2  Objective: Vital signs in last 24 hours: Temp:  [98.8 F (37.1 C)-101.8 F (38.8 C)] 98.8 F (37.1 C) (01/31 0750) Pulse Rate:  [75-111] 80  (01/31 1000) Resp:  [14-28] 27  (01/31 1000) BP: (72-133)/(38-96) 125/53 mmHg (01/31 1000) SpO2:  [71 %-100 %] 94 % (01/31 1000) FiO2 (%):  [59.3 %-100 %] 60 % (01/31 0800) Weight:  [240 lb 11.9 oz (109.2 kg)] 240 lb 11.9 oz (109.2 kg) (01/31 0415) Last BM Date: 07/31/12  Intake/Output from previous day: 01/30 0701 - 01/31 0700 In: 7407.3 [I.V.:3697.3; Blood:330; NG/GT:2480; IV [id number] Out: 2605 [Urine:1880; Stool:200; Chest Tube:525] Intake/Output this shift: Total I/O In: 659.6 [I.V.:169.6; NG/GT:490] Out: 125 [Urine:125]  PE: afeb; stable on vent Wbc wnl Chest tubes in place and intact #1 placed by CCM Pulled back 5 cm per Dr Titus Mould Plan to remove in am if status is stable #2 placed by IR 1/30 Output yellow with blood tinge 350 cc in pleur vac Site stable; intact; clean  Lab Results:   [facility] 08/02/12 0420 08/01/12 1900  WBC 6.6 7.7  HGB 7.4* 8.1*  HCT 24.6* 26.9*  PLT 95* 102*   BMET  Basename 08/02/12 0420 08/01/12 1900  NA 145 148*  K 4.5 4.6  CL 107 107  CO2 36* 38*  GLUCOSE 286* 222*  BUN 105* 111*  CREATININE 1.45* 1.63*  CALCIUM 8.1* 8.6   PT/INR No results found for this basename: LABPROT:2,INR:2 in the last 72 hours ABG  Basename 08/02/12 0738 07/31/12 0757  PHART 7.270* 7.406  HCO3 36.8* 42.5*    Studies/Results: Ct Guided Abscess Drain  08/02/2012  *RADIOLOGY REPORT*  Clinical Data/Indication: LEFT PLEURAL EFFUSION  CT GUIDED ABCESS DRAINAGE WITH CATHETER FOR LEFT THORACOSTOMY.  Procedure: The procedure, risks, benefits, and alternatives were explained to the patient. Questions regarding the procedure were encouraged and  answered. The patient understands and consents to the procedure.  The left back was prepped with betadine in a sterile fashion, and a sterile drape was applied covering the operative field. A sterile gown and sterile gloves were used for the procedure.  Under CT guidance, an 18 gauge needle was inserted into the left pleural effusion.  It was removed over an Amplatz.  A 20-French Thal-Quik drain was inserted and coiled in the fluid collection. It was sewn in place then attached to 20 cm wall suction.  60 ml was aspirated and sent for culture.  It was yellow and turbid.  Findings: Initial scout imaging through the left chest demonstrates a left thoracostomy.  The tube traverses into the left upper lobe. The tip is positioned adjacent to the left atrium. Please refer to sagittal reconstruction imaging for details.  Subsequent images demonstrate placement of a posterior 20-French left thoracostomy tube position within a loculated left pleural effusion.  Complications: None.  IMPRESSION: Successful 20-French left thoracostomy for loculated left pleural effusion.  The original chest tube traverses the left upper lobe parenchyma.  Critical Value/emergent results were called by telephone at the time of interpretation on 08/02/2012 at 8:20 hours to [facility], who verbally acknowledged these results.   Original Report Authenticated By: Marybelle Killings, M.D.    Dg Chest Port 1 View  08/02/2012  *RADIOLOGY REPORT*  Clinical Data: Chest tube placement.  Intubated patient.  PORTABLE CHEST - 1 VIEW  Comparison:  Chest x-ray 08/01/2012.  Findings: A tracheostomy tube is in place with tip 5.2 cm above the carina.  There is a left-sided subclavian central venous catheter with tip terminating in the left innominate vein. Nasogastric tube is seen extending to the level of the gastroesophageal junction, and side port in the mid esophagus.  Previously noted left-sided chest tube is similarly positioned with tip directed toward the medial  aspect of the lower left hemithorax.  There has been interval placement of an additional left-sided chest tube which is curled in the lower left hemithorax.  Lung volumes remain low. There are extensive bibasilar opacities which may reflect areas of atelectasis and/or consolidation superimposed moderate bilateral pleural effusions.  There is cephalization of the pulmonary vasculature, indistinctness of the interstitial markings, and patchy airspace disease throughout the lungs bilaterally suggestive of moderate pulmonary edema.  Cardiac silhouette is largely obscured. The patient is rotated to the left on today's exam, resulting in distortion of the mediastinal contours and reduced diagnostic sensitivity and specificity for mediastinal pathology. Atherosclerosis in the thoracic aorta.  IMPRESSION: 1.  Support apparatus, as above. 2.  Persistent background of moderate pulmonary edema with bibasilar atelectasis and/or consolidation with superimposed moderate bilateral pleural effusions.   Original Report Authenticated By: Vinnie Langton, M.D.    Chest Portable 1 View To Assess Tube Placement And Rule-out Pneumothorax  08/01/2012  *RADIOLOGY REPORT*  Clinical Data: Tracheostomy tube placement.  PORTABLE CHEST - 1 VIEW  Comparison: 08/01/2012 11:04 a.m.  Findings: Endotracheal tube removed.  Tracheostomy tube placed with the tip projecting slightly to the left of midline.  Left central line tip unchanged and may be within the left subclavian vein.  Left-sided chest tube in place.  No gross pneumothorax.  Left-sided pleural thickening remains.  Diffuse asymmetric air space disease relatively similar to the prior exam.  Cardiomegaly.  Calcified tortuous aorta.  IMPRESSION: Endotracheal tube removed.  Tracheostomy tube placed with the tip projecting slightly to the left of midline.  Left central line tip position unchanged and may be within the left subclavian vein.  Left-sided chest tube in place.  No gross  pneumothorax.  Left-sided pleural thickening remains.  Diffuse asymmetric air space disease relatively similar to the prior exam.  Cardiomegaly.   Original Report Authenticated By: Genia Del, M.D.    Dg Chest Port 1 View  08/01/2012  *RADIOLOGY REPORT*  Clinical Data: Chest tube.  PORTABLE CHEST - 1 VIEW  Comparison: 08/01/2012.  Findings: Endotracheal tube tip is 7.4 cm from the carina.  There is a new left basilar thoracostomy tube.  There is no pneumothorax identified.  The left subclavian line and enteric tube appear unchanged.  Evacuation of some of the left pleural effusion with improving aeration of the left lung compared to recent priors. Right pleural effusion remains present.  Bilateral airspace disease and atelectasis.  Loculated pleural fluid is present along the left mid chest.  IMPRESSION: New left basilar thoracostomy tube.  No pneumothorax.  Evacuation of some of the left pleural effusion. Other support apparatus unchanged.   Original Report Authenticated By: Dereck Ligas, M.D.    Dg Chest Port 1 View  08/01/2012  *RADIOLOGY REPORT*  Clinical Data: Intubated patient.  Edema.  Shortness of breath.  PORTABLE CHEST - 1 VIEW  Comparison: Chest x-ray 07/31/2012.  Findings: An endotracheal tube is in place with tip 6.4 cm above the carina. A nasogastric tube is seen extending into the stomach, however, the tip of the nasogastric tube extends below the lower  margin of the image. There is a left-sided subclavian central venous catheter with tip terminating in the distal left innominate vein. Extensive bibasilar opacities may reflect areas of atelectasis and/or consolidation. There is cephalization of the pulmonary vasculature and slight indistinctness of the interstitial markings suggestive of mild pulmonary edema.  Mild cardiomegaly. Moderate bilateral pleural effusions.  Atherosclerosis in the thoracic aorta.  IMPRESSION: 1.  Allowing for slight differences in patient positioning, the  radiographic appearance of the chest is essentially unchanged, as above.   Original Report Authenticated By: Vinnie Langton, M.D.    Dg Chest Port 1 View  07/31/2012  *RADIOLOGY REPORT*  Clinical Data: Post thoracentesis.  PORTABLE CHEST - 1 VIEW  Comparison: 03/31/2013  Findings: 1200 hours.  Endotracheal tube tip is 5.2 cm above the base of the carina.  No evidence for pneumothorax status post thoracentesis.  There is bibasilar atelectasis/infiltrate. Probable associated small bilateral pleural effusions. Cardiopericardial silhouette is at upper limits of normal for size. Left subclavian central line tip overlies the central mediastinum, cranial to the carina. Telemetry leads overlie the chest.  IMPRESSION: No evidence for pneumothorax status post thoracentesis.   Original Report Authenticated By: Misty Stanley, M.D.     Anti-infectives:   Assessment/Plan: s/p  Left chest tube placed for empyema 1/30 Will follow Still on vent   LOS: 13 days    Maximus Hoffert A 08/02/2012

## 2012-08-02 NOTE — Progress Notes (Signed)
1100 Seizure activity noted to upper body/extremities.  Patient noted shaking and jerking movements noted.  Eyes open and gazing upward, not tracking or blinking at this time.  Dr. Titus Mould at bedside, patient being turned to right side for repositioning at this time.  2mg  IV ativan administered.  New orders received.  1643 left shoulder and arm with jerking movements noted, family at bedside.  2mg  IV versed administered. Dr. Joya Gaskins aware, new orders received.   68 Dr. Obie Dredge in to speak with and update family.  New orders received.

## 2012-08-03 ENCOUNTER — Inpatient Hospital Stay (HOSPITAL_COMMUNITY): Payer: BC Managed Care – PPO

## 2012-08-03 LAB — BODY FLUID CULTURE: Culture: NO GROWTH

## 2012-08-03 LAB — BLOOD GAS, ARTERIAL
MECHVT: 400 mL
PEEP: 10 cmH2O
Patient temperature: 100
Sample type: 36496
pH, Arterial: 7.367 (ref 7.350–7.450)

## 2012-08-03 LAB — BASIC METABOLIC PANEL
BUN: 111 mg/dL — ABNORMAL HIGH (ref 6–23)
Chloride: 101 mEq/L (ref 96–112)
Glucose, Bld: 272 mg/dL — ABNORMAL HIGH (ref 70–99)
Potassium: 4.1 mEq/L (ref 3.5–5.1)

## 2012-08-03 LAB — CBC
HCT: 23.5 % — ABNORMAL LOW (ref 39.0–52.0)
HCT: 22.9 % — ABNORMAL LOW (ref 39.0–52.0)
Hemoglobin: 7.2 g/dL — ABNORMAL LOW (ref 13.0–17.0)
Hemoglobin: 7.2 g/dL — ABNORMAL LOW (ref 13.0–17.0)
MCH: 29.3 pg (ref 26.0–34.0)
MCH: 30 pg (ref 26.0–34.0)
MCHC: 30.6 g/dL (ref 30.0–36.0)
MCHC: 31.4 g/dL (ref 30.0–36.0)
RBC: 2.4 MIL/uL — ABNORMAL LOW (ref 4.22–5.81)

## 2012-08-03 LAB — CULTURE, RESPIRATORY W GRAM STAIN: Culture: NO GROWTH

## 2012-08-03 LAB — GLUCOSE, CAPILLARY
Glucose-Capillary: 104 mg/dL — ABNORMAL HIGH (ref 70–99)
Glucose-Capillary: 171 mg/dL — ABNORMAL HIGH (ref 70–99)
Glucose-Capillary: 281 mg/dL — ABNORMAL HIGH (ref 70–99)

## 2012-08-03 LAB — CULTURE, BLOOD (ROUTINE X 2): Culture: NO GROWTH

## 2012-08-03 MED ORDER — SODIUM CHLORIDE 0.9 % IV SOLN
200.0000 mg | Freq: Once | INTRAVENOUS | Status: AC
  Administered 2012-08-03: 200 mg via INTRAVENOUS
  Filled 2012-08-03: qty 4

## 2012-08-03 NOTE — Procedures (Signed)
EEG report.  Brief clinical history: 72 years old male with multiple medical problems and recent development of paroxysmal events concerning for seizures. No prior history of frank epileptic seizures.  Technique: this is a 17 channel routine scalp EEG performed in the ICU setting, with bipolar and monopolar montages arranged in accordance to the international 10/20 system of electrode placement. One channel was dedicated to EKG recording.  Patient is intubated and mechanically ventilated. No activating procedure employed during the test.  Description: As the study begins, there is generalized, continuous, monomorphic, no reactive 4-5 Hz activity that doesn't follow an ictal pattern at any time and is not associated with focal or generalized epileptiform discharges..  EKG showed sinus rhythm.  Impression: this is an abnormal EEG with findings consistent with a severe encephalopathy, non specific as to cause. Please, be aware that the absence of epileptiform discharges does not exclude the possibility of epilepsy.  Clinical correlation is advised.  Dorian Pod, MD

## 2012-08-03 NOTE — Progress Notes (Signed)
PULMONARY  / CRITICAL CARE MEDICINE  Name: [name] MRN: [id number] DOB: 1941-04-07    LOS: 68  REFERRING MD :  [name]  CHIEF COMPLAINT:  Respiratory failure and pneumonia  BRIEF PATIENT DESCRIPTION: 72 yoM with HTN, CHF admitted to [facility] on 1/18 on transfer from [facility] ER with sepsis and ARDS secondary to influenza A/H1N1 multifocal pneumonia.   LINES / TUBES: L. CVL 1/18 >>  ETT 1/18 >>1-31 Trach 1/31 >> Left Chest tube 32 1/30 >>>2-1 Left 20 fr 1/30 >>>  CULTURES: [facility] [phone]): 1/18 Blood culture x 2 >> strep pneumoniae in 2 of 2 pansenstive   Prien: 1/18 BC >> NG 1/18 UC >> negative 1/18 Flu PCR >> Influenza A/ H1N1 1/18 lactate >> 2.3  1/19 PCT >> 8 >> 6.5 1/19 Resp: rare GPC in pairs. No growth at 2 days.   1/25 BC x2: NGTD  ANTIBIOTICS: 1/18 Vanc >> 1/23 restarted 1/25 >>> 1/30 Clindamycin >>> 1/18 Levaquin >> 1/23 1/18 Tamiflu >> planned end date 1/28 1/18 Cefepime >>1/23 1/23 Rocephin 1/23>>> 1/23 1/24 Levofloxacin >>> 1/27  SIGNIFICANT EVENTS / studies:  1/18 Transfer to [facility] from [place] for respiratory failure started on Nimbex protocol for SEVERE ARDS.  1/27 ct chest- free flowing rt effusion small, partial loculated effusion left small 1/29 diagnostic left thora, exudative 1/30 fevers continued, chest tube placement with bleeding. 1/30 20 fr placed with ct guidance per IR 1-30 trached  LEVEL OF CARE:  ICU PRIMARY SERVICE:  PCCM CONSULTANTS:  None CODE STATUS: Full DIET:  Tube feeds DVT Px:  SCDs GI Px:  Protonix  HISTORY OF PRESENT ILLNESS:  72 yo male with known hx of HTN, CHF presented to [name] on 1/18 w/ 1 week hx of cough , congestion w/ progressively worsening dyspnea . Hypoxia in ER , decompensated requiring intubation . CXR w/ multilobar PNA . Hypotension unresponsive to fluid challenged requiring pressors. CCM asked to accept at [facility] ICU 1/18. Transported via [facility] . Renal failure w/ scr ~4 .  Received 6L + in ER.   SUBJECTIVE / INTERVAL HISTORY:  Intubated and sedated.  VITAL SIGNS: Temp:  [99.1 F (37.3 C)-100.7 F (38.2 C)] 99.7 F (37.6 C) (02/01 1200) Pulse Rate:  [76-95] 90  (02/01 1000) Resp:  [20-31] 22  (02/01 1000) BP: (95-139)/(51-114) 108/66 mmHg (02/01 1000) SpO2:  [90 %-95 %] 93 % (02/01 1000) FiO2 (%):  [60 %-100 %] 60 % (02/01 0848) Weight:  [110.6 kg (243 lb 13.3 oz)] 110.6 kg (243 lb 13.3 oz) (02/01 0500) HEMODYNAMICS: CVP:  [7 mmHg-11 mmHg] 11 mmHg VENTILATOR SETTINGS: Vent Mode:  [-] PRVC FiO2 (%):  [60 %-100 %] 60 % Set Rate:  [26 bmp] 26 bmp Vt Set:  [400 mL] 400 mL PEEP:  [10 cmH20] 10 cmH20 Plateau Pressure:  [15 cmH20-22 cmH20] 22 cmH20 INTAKE / OUTPUT: Intake/Output      01/31 0701 - 02/01 0700 02/01 0701 - 02/02 0700   I.V. (mL/kg) 1394.2 (12.6) 20 (0.2)   Blood     NG/GT 1130    IV Piggyback 950    Total Intake(mL/kg) 3474.2 (31.4) 20 (0.2)   Urine (mL/kg/hr) 2625 (1) 500 (0.9)   Stool 250    Chest Tube 140    Total Output 3015 500   Net +459.2 -480         PHYSICAL EXAMINATION: General: Intubated and sedated, opens eyes to stimulation but does not track to voice.   Head: Normocephalic, atraumatic,  right eye with corneal scratch and scleral hemorrhage. Trach site with minimal bleeding and no erythema.   Lungs:  Normal respiratory effort. Mild bronchial breath sounds bilaterally. 2 chest tubes on left without air leak.   Heart: RRR, no MRG.   Abdomen:  BS normal. Soft, nondistended, non-tender.   Extremities: 3+ pitting edema in the bilateral upper and lower extremities.    Skin: Confluent rash over the back, lower abdomen, and dorsal arms with several areas of blistering and skin breakdown.  No pus drainage. Appears stable from yesterday.     IMAGING Ct Guided Abscess Drain  08/02/2012  *RADIOLOGY REPORT*  Clinical Data/Indication: LEFT PLEURAL EFFUSION  CT GUIDED ABCESS DRAINAGE WITH CATHETER FOR LEFT THORACOSTOMY.  Procedure:  The procedure, risks, benefits, and alternatives were explained to the patient. Questions regarding the procedure were encouraged and answered. The patient understands and consents to the procedure.  The left back was prepped with betadine in a sterile fashion, and a sterile drape was applied covering the operative field. A sterile gown and sterile gloves were used for the procedure.  Under CT guidance, an 18 gauge needle was inserted into the left pleural effusion.  It was removed over an Amplatz.  A 20-French Thal-Quik drain was inserted and coiled in the fluid collection. It was sewn in place then attached to 20 cm wall suction.  60 ml was aspirated and sent for culture.  It was yellow and turbid.  Findings: Initial scout imaging through the left chest demonstrates a left thoracostomy.  The tube traverses into the left upper lobe. The tip is positioned adjacent to the left atrium. Please refer to sagittal reconstruction imaging for details.  Subsequent images demonstrate placement of a posterior 20-French left thoracostomy tube position within a loculated left pleural effusion.  Complications: None.  IMPRESSION: Successful 20-French left thoracostomy for loculated left pleural effusion.  The original chest tube traverses the left upper lobe parenchyma.  Critical Value/emergent results were called by telephone at the time of interpretation on 08/02/2012 at 8:20 hours to [facility], who verbally acknowledged these results.   Original Report Authenticated By: Marybelle Killings, M.D.    Dg Chest Port 1 View  08/03/2012  *RADIOLOGY REPORT*  Clinical Data: Chest tube  PORTABLE CHEST - 1 VIEW  Comparison:   the previous day's study  Findings: Tracheostomy, nasogastric tube, and left Thal chest tube stable in position.  The second left chest tube has its proximal side hole external to the thoracic cage as before.  Moderate coarse airspace opacities throughout both lungs with dense consolidation in the lung bases, marginally  improved since previous exam.  Heart size within normal limits.  Probable small pleural effusions.  No pneumothorax evident.  IMPRESSION:  1.  Slight improvement in bilateral infiltrates or edema. 2. Support hardware stable in position.   Original Report Authenticated By: D. Wallace Going, MD    Dg Chest Port 1 View  08/02/2012  *RADIOLOGY REPORT*  Clinical Data: Acute respiratory failure, hypoxia, retracted left chest tube  PORTABLE CHEST - 1 VIEW  Comparison: 08/02/2012  Findings: Slight retraction of one of the left chest tubes.  The side port of the chest tube projects over the chest wall and may be outside the thoracic cavity.  Other support apparatus stable.  Monitor leads overlie the chest. Stable extensive diffuse airspace process versus edema or ARDS throughout both lungs.  No interval change in aeration.  Stable pleural effusions.  No pneumothorax.  IMPRESSION: Interval retraction of one  of the left chest tubes.  No pneumothorax  Stable exam   Original Report Authenticated By: Jerilynn Mages. Annamaria Boots, M.D.    Dg Chest Port 1 View  08/02/2012  *RADIOLOGY REPORT*  Clinical Data: Chest tube placement.  Intubated patient.  PORTABLE CHEST - 1 VIEW  Comparison: Chest x-ray 08/01/2012.  Findings: A tracheostomy tube is in place with tip 5.2 cm above the carina.  There is a left-sided subclavian central venous catheter with tip terminating in the left innominate vein. Nasogastric tube is seen extending to the level of the gastroesophageal junction, and side port in the mid esophagus.  Previously noted left-sided chest tube is similarly positioned with tip directed toward the medial aspect of the lower left hemithorax.  There has been interval placement of an additional left-sided chest tube which is curled in the lower left hemithorax.  Lung volumes remain low. There are extensive bibasilar opacities which may reflect areas of atelectasis and/or consolidation superimposed moderate bilateral pleural effusions.  There is  cephalization of the pulmonary vasculature, indistinctness of the interstitial markings, and patchy airspace disease throughout the lungs bilaterally suggestive of moderate pulmonary edema.  Cardiac silhouette is largely obscured. The patient is rotated to the left on today's exam, resulting in distortion of the mediastinal contours and reduced diagnostic sensitivity and specificity for mediastinal pathology. Atherosclerosis in the thoracic aorta.  IMPRESSION: 1.  Support apparatus, as above. 2.  Persistent background of moderate pulmonary edema with bibasilar atelectasis and/or consolidation with superimposed moderate bilateral pleural effusions.   Original Report Authenticated By: Vinnie Langton, M.D.    Chest Portable 1 View To Assess Tube Placement And Rule-out Pneumothorax  08/01/2012  *RADIOLOGY REPORT*  Clinical Data: Tracheostomy tube placement.  PORTABLE CHEST - 1 VIEW  Comparison: 08/01/2012 11:04 a.m.  Findings: Endotracheal tube removed.  Tracheostomy tube placed with the tip projecting slightly to the left of midline.  Left central line tip unchanged and may be within the left subclavian vein.  Left-sided chest tube in place.  No gross pneumothorax.  Left-sided pleural thickening remains.  Diffuse asymmetric air space disease relatively similar to the prior exam.  Cardiomegaly.  Calcified tortuous aorta.  IMPRESSION: Endotracheal tube removed.  Tracheostomy tube placed with the tip projecting slightly to the left of midline.  Left central line tip position unchanged and may be within the left subclavian vein.  Left-sided chest tube in place.  No gross pneumothorax.  Left-sided pleural thickening remains.  Diffuse asymmetric air space disease relatively similar to the prior exam.  Cardiomegaly.   Original Report Authenticated By: Genia Del, M.D.    Ct Portable Head W/o Cm  08/02/2012  *RADIOLOGY REPORT*  Clinical Data: New onset seizures  CT HEAD WITHOUT CONTRAST  Technique:  Contiguous axial  images were obtained from the base of the skull through the vertex without contrast.  Comparison: 07/29/2012  Findings: No evidence of parenchymal hemorrhage or extra-axial fluid collection. No mass lesion, mass effect, or midline shift.  No CT evidence of acute infarction.  Cerebral volume is age appropriate.  No ventriculomegaly.  Basal cisterns remain patent.  Partial opacification of the bilateral maxillary and sphenoid sinuses.  The left mastoid air cells are under pneumatized.  Nasopharyngeal tube.  No evidence of calvarial fracture.  IMPRESSION: No evidence of acute intracranial abnormality.   Original Report Authenticated By: Julian Hy, M.D.    DIAGNOSES: Active Problems:  Acute respiratory failure with hypoxia  ARDS (adult respiratory distress syndrome)  Acute renal failure  Other convulsions  Abnormal involuntary movement  Basic Metabolic Panel:  Basename 08/03/12 0349 08/02/12 1635 08/02/12 0420  NA 140 140 --  K 4.1 4.6 --  CL 101 100 --  CO2 37* 36* --  GLUCOSE 272* 270* --  BUN 111* 111* --  CREATININE 1.39* 1.47* --  CALCIUM 8.6 8.7 --  MG 2.0 -- 1.9  PHOS 4.5 -- 5.2*   Liver Function Tests:  [facility] 07/31/12 1622  AST 42*  ALT 56*  ALKPHOS 59  BILITOT 0.4  PROT 5.6*  ALBUMIN 1.6*   CBC:  Basename 08/03/12 0349 08/02/12 2022  WBC 6.4 9.2  NEUTROABS -- --  HGB 7.2* 7.8*  HCT 23.5* 25.5*  MCV 95.5 95.9  PLT 98* 103*   CBG:  Basename 08/03/12 0745 08/03/12 0353 08/03/12 0017 08/02/12 2026 08/02/12 1614 08/02/12 1220  GLUCAP 202* 242* 281* 279* 244* 238*   Misc. Labs: LDH blood: 201  Thoracocentesis labs: Glucose 142 LDH 6732 PH 9.0 WBC: 2600 Diff: 90% Neutrophils GS: no organisms seen.   ASSESSMENT / PLAN:  PULMONARY  Lab 08/03/12 0337 08/02/12 2248 08/02/12 0738  PHART 7.367 7.305* 7.270*  PCO2ART 60.5* 68.0* 81.8*  PO2ART 68.8* 58.9* 61.0*  HCO3 33.6* 32.9* 36.8*  TCO2 35.4 35.0 39   A:  1) Multilobar PNA - respiratory  viral panel shows influenza only.  flu PCR positive for H1N1. sputum culture from [place] showed S. Pneumo.  [facility] sputum culture shows GPC in pairs which is likely S. pneumo but there was no growth in the final culture. Treating for flu and S. pneumo. CT of chest shows a moderate partially loculated left pleural effusion and a small right pleural effusion that appears free flowing.  Diagnostic thoracocentesis showed probably empyema.    2) Influenza A/ H1N1 infection: Positive flu PCR and now resp viral panel shows no other viruses.   3) Severe ARDS - Able to maintain off nimbex after a long period on.  Oxygenation stable on FiO2 60% and PEEP 10.   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 5) exudative pleural effusion: Lkely empyema Chest tube placed by IR after bedside attempt, draining well with fever course coming down and WBC count coming down.  ID consulted to assist with antibiotics and duration.  They suggested continuing Vanc and adding clindamycin.    P:   - See ID. - completed 10 days of Tamilfu  - Continue to wean per ARDS protocol - unable to reduce O2 needs, increased rate - Check ABG as needed - Limit bronchodilators in ards - trach wnl - see ID for CT evaluation- fo chest tube - Monitor chest tube output, consider D/C of the large bore chest tube. Likely in fissure , but had significant bleeding frmo this, will back it out 4-5 cm to ensure no ptx air noted.  CARDIOVASCULAR  A:  1) Sepsis - 2/2 multilobar PNA, influenza A/ H1N1.  Febrile for the last few days still though WBC count trending downward.  Levo requirement higher then yesterday may be related to blood loss yesterday vs sepsis now that empyema is tapped.   2) Elevated troponins - question of demand ischemia vs NSTEMI , no further therapy recommedned 3) AAA - 6.4 cm per CT Abd 1/19.  Will continue to monitor unless developing  hypotension or hemodynamic compromise. 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  P:  - Monitor AAA  for now, low threshold for vascular consult if worsening hemodynamically will monitor for now - Continue aspirin 81mg  daily. - Levo started, titrate to MAP of 65 for now. -Dc vasopressin 2-1 and use levo if pressors needed Cbc in am   RENAL  Lab 08/03/12 0349 08/02/12 1635 08/02/12 0420  NA 140 140 145  K 4.1 4.6 4.5  CL 101 100 107  CO2 37* 36* 36*  BUN 111* 111* 105*  CREATININE 1.39* 1.47* 1.45*  GLUCOSE 272* 270* 286*   A:  1) Acute renal failure  - ATN in setting of hypotension and volume depletion in setting of sepsis, improving currently.  CVP stable 11-13.  UOP brisk without lasix again.  Creatinine improving and BUN down 2. Hyperphosphatemia: Improving with resolution of #1 2) Hypokalemia - replete as needed 3) Hypernatremia: Na down   P:  - Continue to monitor lytes daily  -hold T F - free water to 400 q4  - D/C D5  - Bmet in the AM.   GASTROINTESTINAL  A: No acute issues. Having BM.  P:  - Continue tube feeds after trach - ppi -needs PEG, ordered  HEMATOLOGIC  Lab 08/03/12 0349 08/02/12 2022 08/02/12 0420 07/29/12 1225  WBC 6.4 9.2 6.6 --  HGB 7.2* 7.8* 7.4* --  HCT 23.5* 25.5* 24.6* --  PLT 98* 103* 95* --  APTT -- -- -- 35  INR -- -- -- 1.38   A:  1) Acute normocytic anemia - likely related to critical illness, unknown baseline. FOBT positive yesterday.  HgB decreased today likely related to the two procedures yesterday.  HgB 7.4.  Got 1 unit of blood yesterday (chest tube) and will transfuse again if HgB <7. INR stable  2) Thrombocytopenia - unknown baseline, low normal in 05/2008. No obvious source of bleed. Likely related to critical illness vs bleeding yesterday.  Will stop Heparin today.  3) hemoconcetration P:  - Continue to monitor CBC with pos balance, for dilution, only mild changes as of now - sub q hep holding -  coags reviewed -evaluate hgb from pleaural space  INFECTIOUS  Lab 08/03/12 0349 08/02/12 2022 08/02/12 0420 08/01/12 1900  WBC 6.4 9.2 6.6 --  NEUTROABS -- -- -- --  LATICACIDVEN -- -- -- 1.4  PROCALCITON -- -- -- --   A:  1) Sepsis secondary to multilobar PNA and influenza 2) S. Pneumonia bacteremia - 2/2 blood cultures positive with S. Pneumo, pan sensitive from [place].  BC negative here at [facility]. Resp culture from [facility] showing [place] in pairs but no growth on final culture. Narrowed to Levaquin then to Vanc.  Today (1/31) is day 14 of antibiotics. Continued to have fevers over several days so a thoracocentesis was performed that showed an exudative effusion that is likely empyema.  Chest tube placed 1-30 and now fever curve trending downward.  ID consulted and added Clindamycin and will extend the duration of antibiotics.  3. Drug rash with fever: likely secondary to Cephalosporins.  Several areas of blistering noted but does not appear to be Steven-johnson's  4. Leukocytosis - continues to improve.  Chest tube placement yesterday draining well with decrease in the fever curve.    P:  - Vanc currently given sensitivities from blood culture from [facility]  - Added Clindamycin 1/30 - Likely duration of antibiotics 4-6 weeks. - completed Tamiflu day 10/10. - Restarted stress steroids 1-30 - Follow ever curve.   ENDOCRINE / RHEUMATOLOGIC  Lab 08/03/12 0745 08/03/12 0353 08/03/12  0017 08/02/12 2026 08/02/12 1614  GLUCAP 202* 242* 281* 279* 244*   A: 1) Prediabetes - blood sugars stable elevated with increased D5 drip.  Better controlled today but still getting significant SSI so will increased lantus again.  P:  - ICU hyperglycemia protocol.  - increase Lantus to 30 units now that we start - In goal 140-180 -pressors, maintain stress steroids  NEUROLOGIC / PSYCHIATRIC  A: 1) Acute encephalopathy - in setting of sepsis vs now hypernatremia vs Uremia. Hypernatremia corrected now  and BUN continues to fall with resolution of ATN.  2. 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  Scleral hemorrhage today so may need to consult ophtho today.  P:  - Continue with RAAS goal of -1 to -2, fent only - intermittent Versed - added Risperdal in hopes to avoid versed  CLINICAL SUMMARY: 8 yoM with HTN, CHF admitted to [facility] MICU on transfer from [facility] on 07/20/2012 secondary to sepsis in setting  of influenza A/ H1N1 / multilobar PNA. Intubated in ED secondary to hypoxic respiratory failure. Has ARDS on the protocol nimbex 1/19 to 1/24.  Started empirically on broad spectrum ABx on admission and narrowed to Vanc and Clinda for empyema total duration of 4 weeks.  Drug fever/rash with Cephalosporins though continuing longer then expected, Chest tube placed (first with adhesion bleeding likely) and draining.  Tolerated trach well.  Remains on pressors, goal to add vaso, back out chest tube 1-31. 20 fr left ct placed per IR to remain.  Richardson Landry Minor ACNP Maryanna Shape PCCM Pager [phone] till 3 pm If no answer page [phone] 08/03/2012, 12:10 PM  Attending:  I have seen and examined the patient with nurse practitioner/resident and agree with the note above.   CC time by me 45 minutes.  Jillyn Hidden PCCM Pager: [phone] Cell: [phone] If no response, call [phone]

## 2012-08-03 NOTE — Progress Notes (Addendum)
Patient ID: [name], male   DOB: 04/08/1941, 73 y.o.   MRN: [id number]   Eventual Perc G tube   IR aware; pt critically ill with many issues now On vent L empyema- Chest tube in place Temp still 100.1 Wbc trended down   We will re eval on Mon (2/3) about G tube Poss G tube soon

## 2012-08-03 NOTE — Progress Notes (Signed)
Subjective: L chest tube for empyema placed 1/30 450 cc in pleurvac now Pt still unresponsive/vent Although eyes open and awake  Objective: Vital signs in last 24 hours: Temp:  [99.1 F (37.3 C)-100.7 F (38.2 C)] 99.1 F (37.3 C) (02/01 0803) Pulse Rate:  [76-95] 87  (02/01 0900) Resp:  [20-31] 23  (02/01 0900) BP: (87-139)/(44-114) 102/58 mmHg (02/01 0900) SpO2:  [86 %-95 %] 93 % (02/01 0900) FiO2 (%):  [60 %-100 %] 60 % (02/01 0848) Weight:  [243 lb 13.3 oz (110.6 kg)] 243 lb 13.3 oz (110.6 kg) (02/01 0500) Last BM Date: 07/31/12  Intake/Output from previous day: 01/31 0701 - 02/01 0700 In: 3474.2 [I.V.:1394.2; NG/GT:1130; IV Piggyback:950] Out: 3015 [Urine:2625; Stool:250; Chest Tube:140] Intake/Output this shift: Total I/O In: -  Out: 200 [Urine:200]  PE: afeb vss Vent CXR better today Output 450 cc in pleurvac; brownish fluid Chest tube intact Clean and dry   Lab Results:   [facility] 08/03/12 0349 08/02/12 2022  WBC 6.4 9.2  HGB 7.2* 7.8*  HCT 23.5* 25.5*  PLT 98* 103*   BMET  Basename 08/03/12 0349 08/02/12 1635  NA 140 140  K 4.1 4.6  CL 101 100  CO2 37* 36*  GLUCOSE 272* 270*  BUN 111* 111*  CREATININE 1.39* 1.47*  CALCIUM 8.6 8.7   PT/INR No results found for this basename: LABPROT:2,INR:2 in the last 72 hours ABG  Basename 08/03/12 0337 08/02/12 2248  PHART 7.367 7.305*  HCO3 33.6* 32.9*    Studies/Results: Ct Guided Abscess Drain  08/02/2012  *RADIOLOGY REPORT*  Clinical Data/Indication: LEFT PLEURAL EFFUSION  CT GUIDED ABCESS DRAINAGE WITH CATHETER FOR LEFT THORACOSTOMY.  Procedure: The procedure, risks, benefits, and alternatives were explained to the patient. Questions regarding the procedure were encouraged and answered. The patient understands and consents to the procedure.  The left back was prepped with betadine in a sterile fashion, and a sterile drape was applied covering the operative field. A sterile gown and sterile  gloves were used for the procedure.  Under CT guidance, an 18 gauge needle was inserted into the left pleural effusion.  It was removed over an Amplatz.  A 20-French Thal-Quik drain was inserted and coiled in the fluid collection. It was sewn in place then attached to 20 cm wall suction.  60 ml was aspirated and sent for culture.  It was yellow and turbid.  Findings: Initial scout imaging through the left chest demonstrates a left thoracostomy.  The tube traverses into the left upper lobe. The tip is positioned adjacent to the left atrium. Please refer to sagittal reconstruction imaging for details.  Subsequent images demonstrate placement of a posterior 20-French left thoracostomy tube position within a loculated left pleural effusion.  Complications: None.  IMPRESSION: Successful 20-French left thoracostomy for loculated left pleural effusion.  The original chest tube traverses the left upper lobe parenchyma.  Critical Value/emergent results were called by telephone at the time of interpretation on 08/02/2012 at 8:20 hours to [facility], who verbally acknowledged these results.   Original Report Authenticated By: Marybelle Killings, M.D.    Dg Chest Port 1 View  08/03/2012  *RADIOLOGY REPORT*  Clinical Data: Chest tube  PORTABLE CHEST - 1 VIEW  Comparison:   the previous day's study  Findings: Tracheostomy, nasogastric tube, and left Thal chest tube stable in position.  The second left chest tube has its proximal side hole external to the thoracic cage as before.  Moderate coarse airspace opacities throughout both lungs with  dense consolidation in the lung bases, marginally improved since previous exam.  Heart size within normal limits.  Probable small pleural effusions.  No pneumothorax evident.  IMPRESSION:  1.  Slight improvement in bilateral infiltrates or edema. 2. Support hardware stable in position.   Original Report Authenticated By: D. Wallace Going, MD    Dg Chest Port 1 View  08/02/2012  *RADIOLOGY REPORT*   Clinical Data: Acute respiratory failure, hypoxia, retracted left chest tube  PORTABLE CHEST - 1 VIEW  Comparison: 08/02/2012  Findings: Slight retraction of one of the left chest tubes.  The side port of the chest tube projects over the chest wall and may be outside the thoracic cavity.  Other support apparatus stable.  Monitor leads overlie the chest. Stable extensive diffuse airspace process versus edema or ARDS throughout both lungs.  No interval change in aeration.  Stable pleural effusions.  No pneumothorax.  IMPRESSION: Interval retraction of one of the left chest tubes.  No pneumothorax  Stable exam   Original Report Authenticated By: Jerilynn Mages. Annamaria Boots, M.D.    Dg Chest Port 1 View  08/02/2012  *RADIOLOGY REPORT*  Clinical Data: Chest tube placement.  Intubated patient.  PORTABLE CHEST - 1 VIEW  Comparison: Chest x-ray 08/01/2012.  Findings: A tracheostomy tube is in place with tip 5.2 cm above the carina.  There is a left-sided subclavian central venous catheter with tip terminating in the left innominate vein. Nasogastric tube is seen extending to the level of the gastroesophageal junction, and side port in the mid esophagus.  Previously noted left-sided chest tube is similarly positioned with tip directed toward the medial aspect of the lower left hemithorax.  There has been interval placement of an additional left-sided chest tube which is curled in the lower left hemithorax.  Lung volumes remain low. There are extensive bibasilar opacities which may reflect areas of atelectasis and/or consolidation superimposed moderate bilateral pleural effusions.  There is cephalization of the pulmonary vasculature, indistinctness of the interstitial markings, and patchy airspace disease throughout the lungs bilaterally suggestive of moderate pulmonary edema.  Cardiac silhouette is largely obscured. The patient is rotated to the left on today's exam, resulting in distortion of the mediastinal contours and reduced diagnostic  sensitivity and specificity for mediastinal pathology. Atherosclerosis in the thoracic aorta.  IMPRESSION: 1.  Support apparatus, as above. 2.  Persistent background of moderate pulmonary edema with bibasilar atelectasis and/or consolidation with superimposed moderate bilateral pleural effusions.   Original Report Authenticated By: Vinnie Langton, M.D.    Chest Portable 1 View To Assess Tube Placement And Rule-out Pneumothorax  08/01/2012  *RADIOLOGY REPORT*  Clinical Data: Tracheostomy tube placement.  PORTABLE CHEST - 1 VIEW  Comparison: 08/01/2012 11:04 a.m.  Findings: Endotracheal tube removed.  Tracheostomy tube placed with the tip projecting slightly to the left of midline.  Left central line tip unchanged and may be within the left subclavian vein.  Left-sided chest tube in place.  No gross pneumothorax.  Left-sided pleural thickening remains.  Diffuse asymmetric air space disease relatively similar to the prior exam.  Cardiomegaly.  Calcified tortuous aorta.  IMPRESSION: Endotracheal tube removed.  Tracheostomy tube placed with the tip projecting slightly to the left of midline.  Left central line tip position unchanged and may be within the left subclavian vein.  Left-sided chest tube in place.  No gross pneumothorax.  Left-sided pleural thickening remains.  Diffuse asymmetric air space disease relatively similar to the prior exam.  Cardiomegaly.   Original Report Authenticated By:  Genia Del, M.D.    Dg Chest Port 1 View  08/01/2012  *RADIOLOGY REPORT*  Clinical Data: Chest tube.  PORTABLE CHEST - 1 VIEW  Comparison: 08/01/2012.  Findings: Endotracheal tube tip is 7.4 cm from the carina.  There is a new left basilar thoracostomy tube.  There is no pneumothorax identified.  The left subclavian line and enteric tube appear unchanged.  Evacuation of some of the left pleural effusion with improving aeration of the left lung compared to recent priors. Right pleural effusion remains present.  Bilateral  airspace disease and atelectasis.  Loculated pleural fluid is present along the left mid chest.  IMPRESSION: New left basilar thoracostomy tube.  No pneumothorax.  Evacuation of some of the left pleural effusion. Other support apparatus unchanged.   Original Report Authenticated By: Dereck Ligas, M.D.    Ct Portable Head W/o Cm  08/02/2012  *RADIOLOGY REPORT*  Clinical Data: New onset seizures  CT HEAD WITHOUT CONTRAST  Technique:  Contiguous axial images were obtained from the base of the skull through the vertex without contrast.  Comparison: 07/29/2012  Findings: No evidence of parenchymal hemorrhage or extra-axial fluid collection. No mass lesion, mass effect, or midline shift.  No CT evidence of acute infarction.  Cerebral volume is age appropriate.  No ventriculomegaly.  Basal cisterns remain patent.  Partial opacification of the bilateral maxillary and sphenoid sinuses.  The left mastoid air cells are under pneumatized.  Nasopharyngeal tube.  No evidence of calvarial fracture.  IMPRESSION: No evidence of acute intracranial abnormality.   Original Report Authenticated By: Julian Hy, M.D.     Anti-infectives:   Assessment/Plan: s/p  L empyema; chest tube placed 1/30 Improved cxr Will follow   LOS: 14 days    Karimah Winquist A 08/03/2012

## 2012-08-03 NOTE — Procedures (Signed)
Pulled back to give time to re eval for bleeding for dc it Lavon Paganini. Titus Mould, MD, [place] [facility]: [facility]

## 2012-08-03 NOTE — Progress Notes (Addendum)
NEURO HOSPITALIST PROGRESS NOTE   SUBJECTIVE:                                                                           No new neurological developments. Nursing staff and family report sporadic twitching of the right shoulder with touch.  EEG showed generalized, continuous delta-theta slowing consistent with severe encephalopathy but no electrographic seizures. CT brain showed no acute intracranial abnormality. Dilantin level 7 today.  OBJECTIVE:                                                                                                                           Vital signs in last 24 hours: Temp:  [99.1 F (37.3 C)-100.7 F (38.2 C)] 99.1 F (37.3 C) (02/01 0803) Pulse Rate:  [76-95] 90  (02/01 1000) Resp:  [20-31] 22  (02/01 1000) BP: (92-139)/(49-114) 108/66 mmHg (02/01 1000) SpO2:  [90 %-95 %] 93 % (02/01 1000) FiO2 (%):  [60 %-100 %] 60 % (02/01 0848) Weight:  [110.6 kg (243 lb 13.3 oz)] 110.6 kg (243 lb 13.3 oz) (02/01 0500)  Intake/Output from previous day: 01/31 0701 - 02/01 0700 In: 3474.2 [I.V.:1394.2; NG/GT:1130; IV Piggyback:950] Out: 3015 [Urine:2625; Stool:250; Chest Tube:140] Intake/Output this shift: Total I/O In: 20 [I.V.:20] Out: 500 [Urine:500] Nutritional status: NPO  Past Medical History  Diagnosis Date  . Hypertension   . Hyperlipidemia   . OSA (obstructive sleep apnea)     on cpap  . Prediabetes     Neurologic ROS negative with exception of above. Musculoskeletal ROS: deferred.  Neurologic Exam:  Intubated and sedated. Mental status: limited exam by sedation, but doesn't respond to verbal stimuli. CN 2-12: pupils 1 mm bilaterally, sluggishly reactive. No gaze preference. Doesn't blink to threat.  EOM absent on doll's maneuver. No obvious facial weakness.  Motor: no spontaneous movements. Sensory: reacts to noxious stimuli but can not localize. DTR's: 1+ throughout . Plantars: mute  bilaterally. Coordination and gait: no tested.  Lab Results: No results found for this basename: cbc, bmp, coags, chol, tri, ldl, hga1c   Lipid Panel No results found for this basename: CHOL,TRIG,HDL,CHOLHDL,VLDL,LDLCALC in the last 72 hours  Studies/Results: Ct Guided Abscess Drain  08/02/2012  *RADIOLOGY REPORT*  Clinical Data/Indication: LEFT PLEURAL EFFUSION  CT GUIDED ABCESS DRAINAGE WITH CATHETER FOR LEFT THORACOSTOMY.  Procedure: The procedure, risks, benefits, and alternatives were explained to the patient. Questions regarding the procedure were encouraged and answered. The patient understands and consents to the  procedure.  The left back was prepped with betadine in a sterile fashion, and a sterile drape was applied covering the operative field. A sterile gown and sterile gloves were used for the procedure.  Under CT guidance, an 18 gauge needle was inserted into the left pleural effusion.  It was removed over an Amplatz.  A 20-French Thal-Quik drain was inserted and coiled in the fluid collection. It was sewn in place then attached to 20 cm wall suction.  60 ml was aspirated and sent for culture.  It was yellow and turbid.  Findings: Initial scout imaging through the left chest demonstrates a left thoracostomy.  The tube traverses into the left upper lobe. The tip is positioned adjacent to the left atrium. Please refer to sagittal reconstruction imaging for details.  Subsequent images demonstrate placement of a posterior 20-French left thoracostomy tube position within a loculated left pleural effusion.  Complications: None.  IMPRESSION: Successful 20-French left thoracostomy for loculated left pleural effusion.  The original chest tube traverses the left upper lobe parenchyma.  Critical Value/emergent results were called by telephone at the time of interpretation on 08/02/2012 at 8:20 hours to [facility], who verbally acknowledged these results.   Original Report Authenticated By: Marybelle Killings,  M.D.    Dg Chest Port 1 View  08/03/2012  *RADIOLOGY REPORT*  Clinical Data: Chest tube  PORTABLE CHEST - 1 VIEW  Comparison:   the previous day's study  Findings: Tracheostomy, nasogastric tube, and left Thal chest tube stable in position.  The second left chest tube has its proximal side hole external to the thoracic cage as before.  Moderate coarse airspace opacities throughout both lungs with dense consolidation in the lung bases, marginally improved since previous exam.  Heart size within normal limits.  Probable small pleural effusions.  No pneumothorax evident.  IMPRESSION:  1.  Slight improvement in bilateral infiltrates or edema. 2. Support hardware stable in position.   Original Report Authenticated By: D. Wallace Going, MD    Dg Chest Port 1 View  08/02/2012  *RADIOLOGY REPORT*  Clinical Data: Acute respiratory failure, hypoxia, retracted left chest tube  PORTABLE CHEST - 1 VIEW  Comparison: 08/02/2012  Findings: Slight retraction of one of the left chest tubes.  The side port of the chest tube projects over the chest wall and may be outside the thoracic cavity.  Other support apparatus stable.  Monitor leads overlie the chest. Stable extensive diffuse airspace process versus edema or ARDS throughout both lungs.  No interval change in aeration.  Stable pleural effusions.  No pneumothorax.  IMPRESSION: Interval retraction of one of the left chest tubes.  No pneumothorax  Stable exam   Original Report Authenticated By: Jerilynn Mages. Annamaria Boots, M.D.    Dg Chest Port 1 View  08/02/2012  *RADIOLOGY REPORT*  Clinical Data: Chest tube placement.  Intubated patient.  PORTABLE CHEST - 1 VIEW  Comparison: Chest x-ray 08/01/2012.  Findings: A tracheostomy tube is in place with tip 5.2 cm above the carina.  There is a left-sided subclavian central venous catheter with tip terminating in the left innominate vein. Nasogastric tube is seen extending to the level of the gastroesophageal junction, and side port in the mid  esophagus.  Previously noted left-sided chest tube is similarly positioned with tip directed toward the medial aspect of the lower left hemithorax.  There has been interval placement of an additional left-sided chest tube which is curled in the lower left hemithorax.  Lung volumes remain low. There are  extensive bibasilar opacities which may reflect areas of atelectasis and/or consolidation superimposed moderate bilateral pleural effusions.  There is cephalization of the pulmonary vasculature, indistinctness of the interstitial markings, and patchy airspace disease throughout the lungs bilaterally suggestive of moderate pulmonary edema.  Cardiac silhouette is largely obscured. The patient is rotated to the left on today's exam, resulting in distortion of the mediastinal contours and reduced diagnostic sensitivity and specificity for mediastinal pathology. Atherosclerosis in the thoracic aorta.  IMPRESSION: 1.  Support apparatus, as above. 2.  Persistent background of moderate pulmonary edema with bibasilar atelectasis and/or consolidation with superimposed moderate bilateral pleural effusions.   Original Report Authenticated By: Vinnie Langton, M.D.    Chest Portable 1 View To Assess Tube Placement And Rule-out Pneumothorax  08/01/2012  *RADIOLOGY REPORT*  Clinical Data: Tracheostomy tube placement.  PORTABLE CHEST - 1 VIEW  Comparison: 08/01/2012 11:04 a.m.  Findings: Endotracheal tube removed.  Tracheostomy tube placed with the tip projecting slightly to the left of midline.  Left central line tip unchanged and may be within the left subclavian vein.  Left-sided chest tube in place.  No gross pneumothorax.  Left-sided pleural thickening remains.  Diffuse asymmetric air space disease relatively similar to the prior exam.  Cardiomegaly.  Calcified tortuous aorta.  IMPRESSION: Endotracheal tube removed.  Tracheostomy tube placed with the tip projecting slightly to the left of midline.  Left central line tip  position unchanged and may be within the left subclavian vein.  Left-sided chest tube in place.  No gross pneumothorax.  Left-sided pleural thickening remains.  Diffuse asymmetric air space disease relatively similar to the prior exam.  Cardiomegaly.   Original Report Authenticated By: Genia Del, M.D.    Ct Portable Head W/o Cm  08/02/2012  *RADIOLOGY REPORT*  Clinical Data: New onset seizures  CT HEAD WITHOUT CONTRAST  Technique:  Contiguous axial images were obtained from the base of the skull through the vertex without contrast.  Comparison: 07/29/2012  Findings: No evidence of parenchymal hemorrhage or extra-axial fluid collection. No mass lesion, mass effect, or midline shift.  No CT evidence of acute infarction.  Cerebral volume is age appropriate.  No ventriculomegaly.  Basal cisterns remain patent.  Partial opacification of the bilateral maxillary and sphenoid sinuses.  The left mastoid air cells are under pneumatized.  Nasopharyngeal tube.  No evidence of calvarial fracture.  IMPRESSION: No evidence of acute intracranial abnormality.   Original Report Authenticated By: Julian Hy, M.D.     MEDICATIONS                                                                                                                       I have reviewed the patient's current medications.  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 Dilantin level sub therapeutic: will give additional 200 mg IV now. Check dilantin level in the morning 07/04/12. Will sign off, please call us if you need further neurology input.  Dorian Pod, MD       Dorian Pod, MD [facility] [phone]  08/03/2012, 11:52 AM

## 2012-08-04 ENCOUNTER — Inpatient Hospital Stay (HOSPITAL_COMMUNITY): Payer: BC Managed Care – PPO

## 2012-08-04 LAB — PHENYTOIN LEVEL, TOTAL: Phenytoin Lvl: 6.8 ug/mL — ABNORMAL LOW (ref 10.0–20.0)

## 2012-08-04 LAB — GLUCOSE, CAPILLARY
Glucose-Capillary: 107 mg/dL — ABNORMAL HIGH (ref 70–99)
Glucose-Capillary: 118 mg/dL — ABNORMAL HIGH (ref 70–99)
Glucose-Capillary: 122 mg/dL — ABNORMAL HIGH (ref 70–99)
Glucose-Capillary: 129 mg/dL — ABNORMAL HIGH (ref 70–99)

## 2012-08-04 LAB — BASIC METABOLIC PANEL
BUN: 106 mg/dL — ABNORMAL HIGH (ref 6–23)
CO2: 35 mEq/L — ABNORMAL HIGH (ref 19–32)
Calcium: 8.8 mg/dL (ref 8.4–10.5)
Chloride: 105 mEq/L (ref 96–112)
Creatinine, Ser: 1.3 mg/dL (ref 0.50–1.35)
GFR calc Af Amer: 63 mL/min — ABNORMAL LOW (ref >90)
GFR calc non Af Amer: 54 mL/min — ABNORMAL LOW (ref >90)
Glucose, Bld: 109 mg/dL — ABNORMAL HIGH (ref 70–99)
Potassium: 3.7 mEq/L (ref 3.5–5.1)
Sodium: 145 mEq/L (ref 135–145)

## 2012-08-04 LAB — PHOSPHORUS: Phosphorus: 4.1 mg/dL (ref 2.3–4.6)

## 2012-08-04 LAB — CBC
HCT: 24.2 % — ABNORMAL LOW (ref 39.0–52.0)
Hemoglobin: 7.5 g/dL — ABNORMAL LOW (ref 13.0–17.0)
MCH: 29.3 pg (ref 26.0–34.0)
MCHC: 31 g/dL (ref 30.0–36.0)
MCV: 94.5 fL (ref 78.0–100.0)
Platelets: 116 10*3/uL — ABNORMAL LOW (ref 150–400)
RBC: 2.56 MIL/uL — ABNORMAL LOW (ref 4.22–5.81)
RDW: 15.2 % (ref 11.5–15.5)
WBC: 7.1 10*3/uL (ref 4.0–10.5)

## 2012-08-04 LAB — ALBUMIN: Albumin: 1.5 g/dL — ABNORMAL LOW (ref 3.5–5.2)

## 2012-08-04 MED ORDER — HYDROCORTISONE SOD SUCCINATE 100 MG IJ SOLR
50.0000 mg | Freq: Three times a day (TID) | INTRAMUSCULAR | Status: DC
  Administered 2012-08-04: 23:00:00 via INTRAVENOUS
  Administered 2012-08-04 – 2012-08-06 (×5): 50 mg via INTRAVENOUS
  Filled 2012-08-04 (×9): qty 1

## 2012-08-04 MED ORDER — SODIUM CHLORIDE 0.9 % IJ SOLN
10.0000 mL | INTRAMUSCULAR | Status: DC | PRN
  Administered 2012-08-04: 10 mL

## 2012-08-04 MED ORDER — SODIUM CHLORIDE 0.9 % IJ SOLN
10.0000 mL | Freq: Two times a day (BID) | INTRAMUSCULAR | Status: DC
  Administered 2012-08-04 – 2012-08-05 (×4): 10 mL
  Administered 2012-08-06: 20 mL
  Administered 2012-08-07 – 2012-08-08 (×4): 10 mL
  Administered 2012-08-09: 30 mL
  Administered 2012-08-09 – 2012-08-11 (×5): 10 mL

## 2012-08-04 NOTE — Progress Notes (Signed)
Subjective: Left chest empyema drain placed 1/30 Still trached 99.7 today  Objective: Vital signs in last 24 hours: Temp:  [99.7 F (37.6 C)-101.2 F (38.4 C)] 99.7 F (37.6 C) (02/02 0748) Pulse Rate:  [90-118] 95  (02/02 0900) Resp:  [22-31] 28  (02/02 0855) BP: (88-119)/(49-66) 108/60 mmHg (02/02 0900) SpO2:  [88 %-99 %] 99 % (02/02 0900) FiO2 (%):  [60 %-70 %] 70 % (02/02 0855) Last BM Date: 08/03/12  Intake/Output from previous day: 02/01 0701 - 02/02 0700 In: 4561.5 [I.V.:622.5; NG/GT:3050; IV Piggyback:889] Out: 3955 [Urine:3075; Stool:800; Chest Tube:80] Intake/Output this shift: Total I/O In: 125 [I.V.:65; NG/GT:60] Out: 300 [Urine:300]  PE:  VSS; trach 99.7 Lt chest tube intact Secure; 530 cc in pleurvac today (450cc) No air leak Wbc wnl CXR 2/2: no change  Lab Results:   Basename 08/04/12 0415 08/03/12 1730  WBC 7.1 7.2  HGB 7.5* 7.2*  HCT 24.2* 22.9*  PLT 116* 108*   BMET  Basename 08/04/12 0415 08/03/12 0349  NA 145 140  K 3.7 4.1  CL 105 101  CO2 35* 37*  GLUCOSE 109* 272*  BUN 106* 111*  CREATININE 1.30 1.39*  CALCIUM 8.8 8.6   PT/INR No results found for this basename: LABPROT:2,INR:2 in the last 72 hours ABG  Basename 08/03/12 0337 08/02/12 2248  PHART 7.367 7.305*  HCO3 33.6* 32.9*    Studies/Results: Dg Chest Port 1 View  08/04/2012  *RADIOLOGY REPORT*  Clinical Data: ARDS  PORTABLE CHEST - 1 VIEW  Comparison:   the previous day's study  Findings: Tracheostomy, nasogastric tube, and left central venous catheter are stable in position.  Moderate diffuse coarse interstitial and alveolar opacities persist.  Left chest tube stable position.  No pneumothorax.  Probable layering pleural effusions, more conspicuous on the right than left.  Heart size within normal limits for technique.  IMPRESSION:  1.  Little change since previous day's portable exam   Original Report Authenticated By: D. Wallace Going, MD    Dg Chest Port 1  View  08/03/2012  *RADIOLOGY REPORT*  Clinical Data: Chest tube  PORTABLE CHEST - 1 VIEW  Comparison:   the previous day's study  Findings: Tracheostomy, nasogastric tube, and left Thal chest tube stable in position.  The second left chest tube has its proximal side hole external to the thoracic cage as before.  Moderate coarse airspace opacities throughout both lungs with dense consolidation in the lung bases, marginally improved since previous exam.  Heart size within normal limits.  Probable small pleural effusions.  No pneumothorax evident.  IMPRESSION:  1.  Slight improvement in bilateral infiltrates or edema. 2. Support hardware stable in position.   Original Report Authenticated By: D. Wallace Going, MD    Dg Chest Port 1 View  08/02/2012  *RADIOLOGY REPORT*  Clinical Data: Acute respiratory failure, hypoxia, retracted left chest tube  PORTABLE CHEST - 1 VIEW  Comparison: 08/02/2012  Findings: Slight retraction of one of the left chest tubes.  The side port of the chest tube projects over the chest wall and may be outside the thoracic cavity.  Other support apparatus stable.  Monitor leads overlie the chest. Stable extensive diffuse airspace process versus edema or ARDS throughout both lungs.  No interval change in aeration.  Stable pleural effusions.  No pneumothorax.  IMPRESSION: Interval retraction of one of the left chest tubes.  No pneumothorax  Stable exam   Original Report Authenticated By: Jerilynn Mages. Annamaria Boots, M.D.    Ct Portable Head  W/o Cm  08/02/2012  *RADIOLOGY REPORT*  Clinical Data: New onset seizures  CT HEAD WITHOUT CONTRAST  Technique:  Contiguous axial images were obtained from the base of the skull through the vertex without contrast.  Comparison: 07/29/2012  Findings: No evidence of parenchymal hemorrhage or extra-axial fluid collection. No mass lesion, mass effect, or midline shift.  No CT evidence of acute infarction.  Cerebral volume is age appropriate.  No ventriculomegaly.  Basal cisterns  remain patent.  Partial opacification of the bilateral maxillary and sphenoid sinuses.  The left mastoid air cells are under pneumatized.  Nasopharyngeal tube.  No evidence of calvarial fracture.  IMPRESSION: No evidence of acute intracranial abnormality.   Original Report Authenticated By: Julian Hy, M.D.     Anti-infectives:   Assessment/Plan: s/p  Left chest empyema drain placed 1./30 Still trach Temp coming down Will cont to follow Aware of G tube request Need afebrile- will watch temp Wife aware and agfreeable   LOS: 15 days    Carolann Brazell A 08/04/2012

## 2012-08-04 NOTE — Progress Notes (Signed)
PULMONARY  / CRITICAL CARE MEDICINE  Name: [name] MRN: [id number] DOB: [birth year]    LOS: 84  REFERRING MD :  [name]  CHIEF COMPLAINT:  Respiratory failure and pneumonia  BRIEF PATIENT DESCRIPTION: 72 yoM with HTN, CHF admitted to [facility] on 1/18 on transfer from [facility] ER with sepsis and ARDS secondary to influenza A/H1N1 multifocal pneumonia.   LINES / TUBES: L. CVL 1/18 >>  ETT 1/18 >>1-31 Trach 1/31 >> Left Chest tube 32 1/30 >>>2-1 Left 20 fr 1/30 >>> 2-2 picc line ordered>>  CULTURES: [facility] [phone]): 1/18 Blood culture x 2 >> strep pneumoniae in 2 of 2 pansenstive   [place]: 1/18 BC >> NG 1/18 UC >> negative 1/18 Flu PCR >> Influenza A/ H1N1 1/18 lactate >> 2.3  1/19 PCT >> 8 >> 6.5 1/19 Resp: rare GPC in pairs. No growth at 2 days.   1/25 BC x2: NGTD  ANTIBIOTICS: 1/18 Vanc >> 1/23 restarted 1/25 >>> 1/30 Clindamycin >>> 1/18 Levaquin >> 1/23 1/18 Tamiflu >> planned end date 1/28 1/18 Cefepime >>1/23 1/23 Rocephin 1/23>>> 1/23 1/24 Levofloxacin >>> 1/27  SIGNIFICANT EVENTS / studies:  1/18 Transfer to [facility] from [place] for respiratory failure started on Nimbex protocol for SEVERE ARDS.  1/27 ct chest- free flowing rt effusion small, partial loculated effusion left small 1/29 diagnostic left thora, exudative 1/30 fevers continued, chest tube placement with bleeding. 1/30 20 fr placed with ct guidance per IR 1-30 trached  LEVEL OF CARE:  ICU PRIMARY SERVICE:  PCCM CONSULTANTS:  None CODE STATUS: Full DIET:  Tube feeds DVT Px:  SCDs GI Px:  Protonix  HISTORY OF PRESENT ILLNESS:  72 yo male with known hx of HTN, CHF presented to [facility] ER on 1/18 w/ 1 week hx of cough , congestion w/ progressively worsening dyspnea . Hypoxia in ER , decompensated requiring intubation . CXR w/ multilobar PNA . Hypotension unresponsive to fluid challenged requiring pressors. CCM asked to accept at [facility] ICU 1/18. Transported via [facility] . Renal  failure w/ scr ~4 . Received 6L + in ER.   SUBJECTIVE / INTERVAL HISTORY:  Intubated and sedated.  VITAL SIGNS: Temp:  [99.7 F (37.6 C)-101.2 F (38.4 C)] 99.7 F (37.6 C) (02/02 0748) Pulse Rate:  [95-118] 103  (02/02 1000) Resp:  [23-31] 28  (02/02 0855) BP: (88-119)/(49-62) 110/60 mmHg (02/02 1000) SpO2:  [88 %-99 %] 98 % (02/02 1000) FiO2 (%):  [60 %-70 %] 70 % (02/02 0855) HEMODYNAMICS: CVP:  [11 mmHg] 11 mmHg VENTILATOR SETTINGS: Vent Mode:  [-] PRVC FiO2 (%):  [60 %-70 %] 70 % Set Rate:  [26 bmp] 26 bmp Vt Set:  [400 mL] 400 mL PEEP:  [10 cmH20] 10 cmH20 Plateau Pressure:  [18 cmH20-23 cmH20] 22 cmH20 INTAKE / OUTPUT: Intake/Output      02/01 0701 - 02/02 0700 02/02 0701 - 02/03 0700   I.V. (mL/kg) 622.5 (5.6) 95 (0.9)   NG/GT 3050 490   IV Piggyback 889    Total Intake(mL/kg) 4561.5 (41.2) 585 (5.3)   Urine (mL/kg/hr) 3075 (1.2) 450   Stool 800    Chest Tube 80    Total Output 3955 450   Net +606.5 +135         PHYSICAL EXAMINATION: General: Intubated and sedated, opens eyes to stimulation but does not track to voice.   Head: Normocephalic, atraumatic, right eye with corneal scratch and scleral hemorrhage. Trach site with minimal bleeding and no erythema.   Lungs:  Normal respiratory effort. Mild bronchial breath sounds bilaterally. 1 chest tube on left without air leak.   Heart: RRR, no MRG.   Abdomen:  BS normal. Soft, nondistended, non-tender.   Extremities: 3+ pitting edema in the bilateral upper and lower extremities.    Skin: Confluent rash over the back, lower abdomen, and dorsal arms with several areas of blistering and skin breakdown.  No pus drainage. Appears stable from yesterday.     IMAGING Dg Chest Port 1 View  08/04/2012  *RADIOLOGY REPORT*  Clinical Data: ARDS  PORTABLE CHEST - 1 VIEW  Comparison:   the previous day's study  Findings: Tracheostomy, nasogastric tube, and left central venous catheter are stable in position.  Moderate diffuse  coarse interstitial and alveolar opacities persist.  Left chest tube stable position.  No pneumothorax.  Probable layering pleural effusions, more conspicuous on the right than left.  Heart size within normal limits for technique.  IMPRESSION:  1.  Little change since previous day's portable exam   Original Report Authenticated By: D. Wallace Going, MD    Dg Chest Port 1 View  08/03/2012  *RADIOLOGY REPORT*  Clinical Data: Chest tube  PORTABLE CHEST - 1 VIEW  Comparison:   the previous day's study  Findings: Tracheostomy, nasogastric tube, and left Thal chest tube stable in position.  The second left chest tube has its proximal side hole external to the thoracic cage as before.  Moderate coarse airspace opacities throughout both lungs with dense consolidation in the lung bases, marginally improved since previous exam.  Heart size within normal limits.  Probable small pleural effusions.  No pneumothorax evident.  IMPRESSION:  1.  Slight improvement in bilateral infiltrates or edema. 2. Support hardware stable in position.   Original Report Authenticated By: D. Wallace Going, MD    Dg Chest Port 1 View  08/02/2012  *RADIOLOGY REPORT*  Clinical Data: Acute respiratory failure, hypoxia, retracted left chest tube  PORTABLE CHEST - 1 VIEW  Comparison: 08/02/2012  Findings: Slight retraction of one of the left chest tubes.  The side port of the chest tube projects over the chest wall and may be outside the thoracic cavity.  Other support apparatus stable.  Monitor leads overlie the chest. Stable extensive diffuse airspace process versus edema or ARDS throughout both lungs.  No interval change in aeration.  Stable pleural effusions.  No pneumothorax.  IMPRESSION: Interval retraction of one of the left chest tubes.  No pneumothorax  Stable exam   Original Report Authenticated By: Jerilynn Mages. Annamaria Boots, M.D.    Ct Portable Head W/o Cm  08/02/2012  *RADIOLOGY REPORT*  Clinical Data: New onset seizures  CT HEAD WITHOUT CONTRAST   Technique:  Contiguous axial images were obtained from the base of the skull through the vertex without contrast.  Comparison: 07/29/2012  Findings: No evidence of parenchymal hemorrhage or extra-axial fluid collection. No mass lesion, mass effect, or midline shift.  No CT evidence of acute infarction.  Cerebral volume is age appropriate.  No ventriculomegaly.  Basal cisterns remain patent.  Partial opacification of the bilateral maxillary and sphenoid sinuses.  The left mastoid air cells are under pneumatized.  Nasopharyngeal tube.  No evidence of calvarial fracture.  IMPRESSION: No evidence of acute intracranial abnormality.   Original Report Authenticated By: Julian Hy, M.D.    DIAGNOSES: Active Problems:  Acute respiratory failure with hypoxia  ARDS (adult respiratory distress syndrome)  Acute renal failure  Other convulsions  Abnormal involuntary movement  Basic Metabolic Panel:  Basename 08/04/12 0415 08/03/12 0349  NA 145 140  K 3.7 4.1  CL 105 101  CO2 35* 37*  GLUCOSE 109* 272*  BUN 106* 111*  CREATININE 1.30 1.39*  CALCIUM 8.8 8.6  MG 2.1 2.0  PHOS 4.1 4.5   Liver Function Tests:  Basename 08/04/12 0415  AST --  ALT --  ALKPHOS --  BILITOT --  PROT --  ALBUMIN 1.5*   CBC:  Basename 08/04/12 0415 08/03/12 1730  WBC 7.1 7.2  NEUTROABS -- --  HGB 7.5* 7.2*  HCT 24.2* 22.9*  MCV 94.5 95.4  PLT 116* 108*   CBG:  Basename 08/04/12 0735 08/04/12 0424 08/04/12 0021 08/03/12 1953 08/03/12 1516 08/03/12 1153  GLUCAP 107* 122* 118* 104* 147* 171*   Misc. Labs: LDH blood: 201  Thoracocentesis labs: Glucose 142 LDH 6732 PH 9.0 WBC: 2600 Diff: 90% Neutrophils GS: no organisms seen.   ASSESSMENT / PLAN:  PULMONARY  Lab 08/03/12 0337 08/02/12 2248 08/02/12 0738  PHART 7.367 7.305* 7.270*  PCO2ART 60.5* 68.0* 81.8*  PO2ART 68.8* 58.9* 61.0*  HCO3 33.6* 32.9* 36.8*  TCO2 35.4 35.0 39   A:  1) Multilobar PNA - respiratory viral panel shows  influenza only.  flu PCR positive for H1N1. sputum culture from [place] showed S. Pneumo.  [facility] sputum culture shows GPC in pairs which is likely S. pneumo but there was no growth in the final culture. Treating for flu and S. pneumo. CT of chest shows a moderate partially loculated left pleural effusion and a small right pleural effusion that appears free flowing.  Diagnostic thoracocentesis showed probably empyema.    2) Influenza A/ H1N1 infection: Positive flu PCR and now resp viral panel shows no other viruses.   3) Severe ARDS - Able to maintain off nimbex after a long period on.  Oxygenation stable .   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 5) exudative pleural effusion: Lkely empyema Chest tube placed by IR after bedside attempt, draining well with fever course coming down and WBC count coming down.  ID consulted to assist with antibiotics and duration.  They suggested continuing Vanc and adding clindamycin.    P:   - See ID. - completed 10 days of [place]  - Continue to wean per ARDS protocol - unable to reduce O2 needs, increased rate - Limit bronchodilators in ards - trach wnl - see ID for CT evaluation- fo chest tube - Monitor chest tube output,  CARDIOVASCULAR  A:  1) Sepsis - 2/2 multilobar PNA, influenza A/ H1N1.  Febrile for the last few days still though WBC count trending downward.  Levo requirement higher then yesterday may be related to blood loss yesterday vs sepsis now that empyema is tapped.   2) Elevated troponins - question of demand ischemia vs NSTEMI , no further therapy recommedned 3) AAA - 6.4 cm per CT Abd 1/19.  Will continue to monitor unless developing hypotension or hemodynamic compromise. 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  P:  - Monitor AAA for now, low threshold for vascular consult if worsening hemodynamically will monitor for now -  Continue aspirin 81mg  daily. - Levo started, titrate to MAP of 65 for now. -Dc vasopressin 2-1 and use levo if pressors needed Cbc in am   RENAL  Lab 08/04/12 0415 08/03/12 0349 08/02/12 1635  NA 145 140 140  K 3.7  4.1 4.6  CL 105 101 100  CO2 35* 37* 36*  BUN 106* 111* 111*  CREATININE 1.30 1.39* 1.47*  GLUCOSE 109* 272* 270*   A:  1) Acute renal failure  - ATN in setting of hypotension and volume depletion in setting of sepsis, improving currently.  CVP stable 11-13.  UOP brisk without lasix again.  Creatinine improving and BUN down 2. Hyperphosphatemia: Improving with resolution of #1 2) Hypokalemia - replete as needed 3) Hypernatremia: Na down   P:  - Continue to monitor lytes daily  -hold T F - free water to 400 q4  - D/C D5  - Bmet in the AM.   GASTROINTESTINAL  A: No acute issues. Having BM.  P:  - Continue tube feeds after trach - ppi -needs PEG, ordered  HEMATOLOGIC  Lab 08/04/12 0415 08/03/12 1730 08/03/12 0349 07/29/12 1225  WBC 7.1 7.2 6.4 --  HGB 7.5* 7.2* 7.2* --  HCT 24.2* 22.9* 23.5* --  PLT 116* 108* 98* --  APTT -- -- -- 35  INR -- -- -- 1.38   A:  1) Acute normocytic anemia - likely related to critical illness, unknown baseline. FOBT positive yesterday.  HgB decreased today likely related to the two procedures yesterday.  HgB 7.4.  Got 1 unit of blood yesterday (chest tube) and will transfuse again if HgB <7. INR stable  2) Thrombocytopenia - unknown baseline, low normal in 05/2008. No obvious source of bleed. Likely related to critical illness vs bleeding yesterday.  Will stop Heparin today.  3) hemoconcetration P:  - Continue to monitor CBC with pos balance, for dilution, only mild changes as of now - sub q hep holding - coags reviewed -evaluate hgb from pleaural space  INFECTIOUS  Lab 08/04/12 0415 08/03/12 1730 08/03/12 0349 08/01/12 1900  WBC 7.1 7.2 6.4 --  NEUTROABS -- -- -- --  LATICACIDVEN -- -- -- 1.4  PROCALCITON -- -- -- --    A:  1) Sepsis secondary to multilobar PNA and influenza 2) S. Pneumonia bacteremia - 2/2 blood cultures positive with S. Pneumo, pan sensitive from [place].  BC negative here at [facility]. Resp culture from [facility] showing [place] in pairs but no growth on final culture. Narrowed to Levaquin then to Vanc.  Today (1/31) is day 14 of antibiotics. Continued to have fevers over several days so a thoracocentesis was performed that showed an exudative effusion that is likely empyema.  Chest tube placed 1-30 and now fever curve trending downward.  ID consulted and added Clindamycin and will extend the duration of antibiotics.  3. Drug rash with fever: likely secondary to Cephalosporins.  Several areas of blistering noted but does not appear to be Steven-johnson's  4. Leukocytosis - continues to improve.  Chest tube placement yesterday draining well with decrease in the fever curve.    P:  - Vanc currently given sensitivities from blood culture from [facility]  - Added Clindamycin 1/30 - Likely duration of antibiotics 4-6 weeks. - completed Tamiflu day 10/10. - Restarted stress steroids 1-30, decreased 2-2 - Follow ever curve.   ENDOCRINE / RHEUMATOLOGIC  Lab 08/04/12 0735 08/04/12 0424 08/04/12 0021 08/03/12 1953 08/03/12 1516  GLUCAP 107* 122* 118* 104* 147*   A: 1) Prediabetes - blood sugars stable elevated with increased D5 drip.  Better controlled today but still getting significant SSI so will increased lantus again.  P:  - ICU hyperglycemia protocol.  -  Lantus to 30 units  - In goal  140-180 -pressors, maintain stress steroids  NEUROLOGIC / PSYCHIATRIC  A: 1) Acute encephalopathy - in setting of sepsis vs now hypernatremia vs Uremia. Hypernatremia corrected now and BUN continues to fall with resolution of ATN.  2. 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  Scleral hemorrhage today so may need to consult ophtho today.  P:  - Continue with RAAS goal of -1 to -2, fent only -  intermittent Versed - added Risperdal in hopes to avoid versed  CLINICAL SUMMARY: 75 yoM with HTN, CHF admitted to [facility] MICU on transfer from [facility] on 07/20/2012 secondary to sepsis in setting  of influenza A/ H1N1 / multilobar PNA. Intubated in ED secondary to hypoxic respiratory failure. Left empyema with chest tube in place.  Richardson Landry Minor ACNP Maryanna Shape PCCM Pager [phone] till 3 pm If no answer page [phone] 08/04/2012, 10:25 AM  Attending:  I have seen and examined the patient with nurse practitioner/resident and agree with the note above.   Wean FiO2 today Stable post CT #1 removal  D/c ABX 2/3?  CC time [place], [place] PCCM Pager: [phone] Cell: [phone] If no response, call [phone]

## 2012-08-04 NOTE — Progress Notes (Signed)
Cath tip culture ordered

## 2012-08-04 NOTE — Progress Notes (Signed)
Peripherally Inserted Central Catheter/Midline Placement  The IV Nurse has discussed with the patient and/or persons authorized to consent for the patient, the purpose of this procedure and the potential benefits and risks involved with this procedure.  The benefits include less needle sticks, lab draws from the catheter and patient may be discharged home with the catheter.  Risks include, but not limited to, infection, bleeding, blood clot (thrombus formation), and puncture of an artery; nerve damage and irregular heat beat.  Alternatives to this procedure were also discussed.  PICC/Midline Placement Documentation  PICC Triple Lumen [id number] PICC Right Basilic (Active)       Gordan Payment 08/04/2012, 3:14 PM

## 2012-08-05 ENCOUNTER — Inpatient Hospital Stay (HOSPITAL_COMMUNITY): Payer: BC Managed Care – PPO

## 2012-08-05 DIAGNOSIS — R0902 Hypoxemia: Secondary | ICD-10-CM

## 2012-08-05 DIAGNOSIS — J9600 Acute respiratory failure, unspecified whether with hypoxia or hypercapnia: Secondary | ICD-10-CM

## 2012-08-05 LAB — CBC
Hemoglobin: 7.7 g/dL — ABNORMAL LOW (ref 13.0–17.0)
MCH: 30.3 pg (ref 26.0–34.0)
MCV: 95.7 fL (ref 78.0–100.0)
Platelets: 143 10*3/uL — ABNORMAL LOW (ref 150–400)
RBC: 2.54 MIL/uL — ABNORMAL LOW (ref 4.22–5.81)
WBC: 7 10*3/uL (ref 4.0–10.5)

## 2012-08-05 LAB — GLUCOSE, CAPILLARY
Glucose-Capillary: 121 mg/dL — ABNORMAL HIGH (ref 70–99)
Glucose-Capillary: 111 mg/dL — ABNORMAL HIGH (ref 70–99)
Glucose-Capillary: 149 mg/dL — ABNORMAL HIGH (ref 70–99)
Glucose-Capillary: 141 mg/dL — ABNORMAL HIGH (ref 70–99)

## 2012-08-05 LAB — BASIC METABOLIC PANEL
CO2: 36 mEq/L — ABNORMAL HIGH (ref 19–32)
Glucose, Bld: 120 mg/dL — ABNORMAL HIGH (ref 70–99)
Potassium: 3.6 mEq/L (ref 3.5–5.1)
Sodium: 147 mEq/L — ABNORMAL HIGH (ref 135–145)

## 2012-08-05 LAB — PROTIME-INR
INR: 1.14 (ref 0.00–1.49)
Prothrombin Time: 14.4 seconds (ref 11.6–15.2)

## 2012-08-05 LAB — MAGNESIUM: Magnesium: 2.1 mg/dL (ref 1.5–2.5)

## 2012-08-05 LAB — BODY FLUID CULTURE: Culture: NO GROWTH

## 2012-08-05 MED ORDER — VANCOMYCIN HCL 10 G IV SOLR
1500.0000 mg | INTRAVENOUS | Status: DC
  Administered 2012-08-06 – 2012-08-07 (×2): 1500 mg via INTRAVENOUS
  Filled 2012-08-05 (×2): qty 1500

## 2012-08-05 NOTE — Progress Notes (Signed)
Patient ID: [name], male   DOB: 19-May-1941, 72 y.o.   MRN: [id number]    [facility]    Date of Admission:  07/20/2012           Day 17 vancomycin        Day 5 clindamycin Active Problems:  Acute respiratory failure with hypoxia  ARDS (adult respiratory distress syndrome)  Acute renal failure  Other convulsions  Abnormal involuntary movement      . antiseptic oral rinse  15 mL Mouth Rinse QID  . aspirin  81 mg Per Tube Daily  . chlorhexidine  15 mL Mouth Rinse BID  . clindamycin (CLEOCIN) IV  300 mg Intravenous Q8H  . feeding supplement (OXEPA)  1,000 mL Per Tube Q24H  . feeding supplement  60 mL Per Tube TID  . free water  400 mL Per Tube Q4H  . hydrocortisone sod succinate (SOLU-CORTEF) injection  50 mg Intravenous Q8H  . insulin aspart  0-20 Units Subcutaneous Q4H  . insulin glargine  30 Units Subcutaneous Q24H  . LORazepam  1 mg Intravenous Q6H  . LORazepam  2 mg Intravenous Once  . magnesium sulfate 1 - 4 g bolus IVPB  2 g Intravenous Once  . multivitamin  5 mL Per Tube Daily  . pantoprazole sodium  40 mg Per Tube Daily  . phenytoin (DILANTIN) IV  125 mg Intravenous Q8H  . propofol  5-70 mcg/kg/min Intravenous Once  . risperiDONE  1 mg Oral BID  . sodium chloride  10-40 mL Intracatheter Q12H  . vancomycin  1,250 mg Intravenous Q24H   Objective: Temp:  [99.3 F (37.4 C)-101.1 F (38.4 C)] 101.1 F (38.4 C) (02/03 1200) Pulse Rate:  [106-121] 110  (02/03 1430) Resp:  [28-32] 31  (02/03 1216) BP: (86-130)/(54-74) 116/73 mmHg (02/03 1430) SpO2:  [92 %-100 %] 95 % (02/03 1430) FiO2 (%):  [50 %-70 %] 50 % (02/03 1217)  General: Unresponsive on ventilator; anasarca Skin: Rash appears to have resolved Lungs: Clear anteriorly; moderate yellow sputum being suctioned Cor: Distant heart sounds no murmur heard Abdomen: Obese, soft  Lab Results Lab Results  Component Value Date   WBC 7.0 08/05/2012   HGB 7.7* 08/05/2012   HCT 24.3*  08/05/2012   MCV 95.7 08/05/2012   PLT 143* 08/05/2012    Lab Results  Component Value Date   CREATININE 1.15 08/05/2012   BUN 99* 08/05/2012   NA 147* 08/05/2012   K 3.6 08/05/2012   CL 108 08/05/2012   CO2 36* 08/05/2012    Lab Results  Component Value Date   ALT 56* 07/31/2012   AST 42* 07/31/2012   ALKPHOS 59 07/31/2012   BILITOT 0.4 07/31/2012      Microbiology: Recent Results (from the past 240 hour(s))  CULTURE, BLOOD (ROUTINE X 2)     Status: Normal   Collection Time   07/27/12 10:05 PM      Component Value Range Status Comment   Specimen Description BLOOD RIGHT HAND   Final    Special Requests BOTTLES DRAWN AEROBIC ONLY 4CC   Final    Culture  Setup Time 07/28/2012 15:02   Final    Culture NO GROWTH 5 DAYS   Final    Report Status 08/03/2012 FINAL   Final   CULTURE, BLOOD (ROUTINE X 2)     Status: Normal   Collection Time   07/28/12 12:01 AM      Component Value Range Status Comment  Specimen Description BLOOD RIGHT HAND   Final    Special Requests BOTTLES DRAWN AEROBIC ONLY [facility]   Final    Culture  Setup Time 07/28/2012 15:02   Final    Culture NO GROWTH 5 DAYS   Final    Report Status 08/03/2012 FINAL   Final   BODY FLUID CULTURE     Status: Normal   Collection Time   07/31/12 11:47 AM      Component Value Range Status Comment   Specimen Description PLEURAL FLUID LEFT   Final    Special Requests 3.0 ML FLUID   Final    Gram Stain     Final    Value: MODERATE WBC PRESENT,BOTH PMN AND MONONUCLEAR     NO ORGANISMS SEEN   Culture NO GROWTH 3 DAYS   Final    Report Status 08/03/2012 FINAL   Final   CULTURE, RESPIRATORY     Status: Normal   Collection Time   08/01/12  3:20 PM      Component Value Range Status Comment   Specimen Description TRACHEAL ASPIRATE   Final    Special Requests NONE   Final    Gram Stain     Final    Value: FEW WBC PRESENT,BOTH PMN AND MONONUCLEAR     NO ORGANISMS SEEN   Culture NO GROWTH 2 DAYS   Final    Report Status 08/03/2012 FINAL   Final   BODY  FLUID CULTURE     Status: Normal   Collection Time   08/01/12  6:31 PM      Component Value Range Status Comment   Specimen Description PLEURAL FLUID LEFT   Final    Special Requests NONE   Final    Gram Stain     Final    Value: FEW WBC PRESENT,BOTH PMN AND MONONUCLEAR     NO ORGANISMS SEEN   Culture NO GROWTH 3 DAYS   Final    Report Status 08/05/2012 FINAL   Final   CLOSTRIDIUM DIFFICILE BY PCR     Status: Normal   Collection Time   08/01/12  7:45 PM      Component Value Range Status Comment   C difficile by pcr NEGATIVE  NEGATIVE Final   CATH TIP CULTURE     Status: Normal (Preliminary result)   Collection Time   08/04/12  4:28 PM      Component Value Range Status Comment   Specimen Description CATH TIP CENTRAL LINE   Final    Special Requests NONE   Final    Culture NO GROWTH   Final    Report Status PENDING   Incomplete     Studies/Results: Dg Chest Port 1 View  08/05/2012  *RADIOLOGY REPORT*  Clinical Data: Ventilator dependence.  Shortness of breath.  PORTABLE CHEST - 1 VIEW  Comparison: 08/04/2012  Findings: 0453 hours.  Tracheostomy tube remains in place. The NG tube passes into the stomach although the distal tip position is not included on the film. The cardiopericardial silhouette is enlarged.  Diffuse interstitial and basilar airspace disease persist without substantial interval change.  Right pleural effusion has increased slightly in the interval.  Left pleural drain remains in place.  Right PICC line is new in the interval and the tip projects at the mid SVC level.  IMPRESSION: No substantial interval change.  Cardiomegaly with diffuse interstitial and basilar airspace opacities.  New right PICC line tip projects at the mid SVC level.  Original Report Authenticated By: Misty Stanley, M.D.    Dg Chest Port 1 View  08/04/2012  *RADIOLOGY REPORT*  Clinical Data: ARDS  PORTABLE CHEST - 1 VIEW  Comparison:   the previous day's study  Findings: Tracheostomy, nasogastric tube, and  left central venous catheter are stable in position.  Moderate diffuse coarse interstitial and alveolar opacities persist.  Left chest tube stable position.  No pneumothorax.  Probable layering pleural effusions, more conspicuous on the right than left.  Heart size within normal limits for technique.  IMPRESSION:  1.  Little change since previous day's portable exam   Original Report Authenticated By: D. Wallace Going, MD     Assessment: He continues to have some low-grade fevers is unclear to me if he has any active infection at this time. Some of the fever recently may have been due to an allergy to cephalosporins that also causes rash. Sputum and pleural fluid cultures are negative recently. C. difficile PCR was negative. His central line was removed and the catheter was sent for culture today.  Plan: 1. I would consider stopping antibiotics soon if no new evidence of active infection is found  Michel Bickers, MD [facility] [place] [phone] pager   [phone] cell 08/05/2012, 2:53 PM

## 2012-08-05 NOTE — Progress Notes (Signed)
PULMONARY  / CRITICAL CARE MEDICINE  Name: [name] MRN: [id number] DOB: 1941/06/04    ADMISSION DATE:  07/20/2012 CONSULTATION DATE:  1/18/20014   REFERRING MD :  [name] ER  CHIEF COMPLAINT:  Dyspnea  BRIEF PATIENT DESCRIPTION:  35 male smoker transferred from [facility] 07/20/12 with Sepsis, ARDS with PNA from H1N1/Influenza A and pneumococcus.  Significant PMHx of HTN, OSA, Hyperlipidemia, pre-DM  SIGNIFICANT EVENTS: 1/18 Transfer to [facility] from [place] for respiratory failure started on Nimbex protocol for SEVERE ARDS.  1/27 ct chest- free flowing rt effusion small, partial loculated effusion left small  1/29 diagnostic left thora, exudative  1/30 fevers continued, chest tube placement with bleeding. 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 1/27 CT head >> mild atrophy 1/27 CT chest >> atherosclerosis, centrilobular/paraseptal emphysema with subpleural blebs and few bulla, patchy ASD b/l Rt > Lt, LLL consolidation, partially loculated Lt pleural effusion 1/31 CT head >> no acute findings 2/01 EEG >> severe encephalopathy, no epileptiform activity  LINES / TUBES: ETT 1/18 >> 1/31 Trach (DF ) 1/30 >>  Lt chest tube 1/30 >>  Rt PICC 2/02 >>  CULTURES: 1/18 Blood [name]) >> pneumococcus 1/20 Respiratory viral panel >> Influenza panel 2/02 Cath tip >>   ANTIBIOTICS: 1/18 Vanc >> 1/23 1/18 Levaquin >> 1/23  1/18 Tamiflu >> 1/28  1/18 Cefepime >>1/23  1/23 Rocephin 1/23>>> 1/23  1/24 Levofloxacin >>> 1/27 1/25 Vancomycin >>  1/30 Clindamycin >>   SUBJECTIVE:  Remains on high PEEP/FiO2.  VITAL SIGNS: Temp:  [99.3 F (37.4 C)-100.7 F (38.2 C)] 100.7 F (38.2 C) (02/03 0800) Pulse Rate:  [104-119] 113  (02/03 0830) Resp:  [28-32] 31  (02/03 0822) BP: (102-126)/(60-77) 123/72 mmHg (02/03 0830) SpO2:  [91 %-98 %] 93 % (02/03 0830) FiO2 (%):  [50 %-70 %] 50  % (02/03 0824) HEMODYNAMICS:   VENTILATOR SETTINGS: Vent Mode:  [-] PRVC FiO2 (%):  [50 %-70 %] 50 % Set Rate:  [16 bmp-26 bmp] 26 bmp Vt Set:  [400 mL-410 mL] 400 mL PEEP:  [10 cmH20] 10 cmH20 Plateau Pressure:  [17 cmH20-22 cmH20] 19 cmH20 INTAKE / OUTPUT: Intake/Output      02/02 0701 - 02/03 0700 02/03 0701 - 02/04 0700   I.V. (mL/kg) 599 (5.4) 40 (0.4)   NG/GT 2260 60   IV Piggyback 686.5    Total Intake(mL/kg) 3545.5 (32.1) 100 (0.9)   Urine (mL/kg/hr) 3080 (1.2) 225   Stool 300    Chest Tube 120    Total Output 3500 225   Net +45.5 -125          PHYSICAL EXAMINATION: General:  Ill appearing Neuro:  Unresponsive HEENT:  Trach site clean Cardiovascular:  s1s2 regular, tachycardic Lungs:  B/l rhonchi, left chest tube in place Abdomen:  Soft, non tender Musculoskeletal:  2+ edema Skin:  No rashes  LABS:  Lab 08/05/12 0430 08/04/12 0415 08/03/12 1730 08/03/12 0349 08/03/12 0337 08/02/12 2248 08/02/12 0738 08/01/12 1900 07/31/12 1622 07/29/12 1225  HGB 7.7* 7.5* 7.2* -- -- -- -- -- -- --  WBC 7.0 7.1 7.2 -- -- -- -- -- -- --  PLT 143* 116* 108* -- -- -- -- -- -- --  NA 147* 145 -- 140 -- -- -- -- -- --  K 3.6 3.7 -- -- -- -- -- -- -- --  CL 108 105 --  101 -- -- -- -- -- --  CO2 36* 35* -- 37* -- -- -- -- -- --  GLUCOSE 120* 109* -- 272* -- -- -- -- -- --  BUN 99* 106* -- 111* -- -- -- -- -- --  CREATININE 1.15 1.30 -- 1.39* -- -- -- -- -- --  CALCIUM 8.7 8.8 -- 8.6 -- -- -- -- -- --  MG 2.1 2.1 -- 2.0 -- -- -- -- -- --  PHOS 4.3 4.1 -- 4.5 -- -- -- -- -- --  AST -- -- -- -- -- -- -- -- 42* --  ALT -- -- -- -- -- -- -- -- 56* --  ALKPHOS -- -- -- -- -- -- -- -- 59 --  BILITOT -- -- -- -- -- -- -- -- 0.4 --  PROT -- -- -- -- -- -- -- -- 5.6* --  ALBUMIN -- 1.5* -- -- -- -- -- -- 1.6* --  APTT -- -- -- -- -- -- -- -- -- 35  INR 1.14 -- -- -- -- -- -- -- -- 1.38  LATICACIDVEN -- -- -- -- -- -- -- 1.4 -- --  TROPONINI -- -- -- -- -- -- -- -- -- --  PROCALCITON  -- -- -- -- -- -- -- -- -- --  PROBNP -- -- -- -- -- -- -- -- -- --  O2SATVEN -- -- -- -- -- -- -- -- -- --  PHART -- -- -- -- 7.367 7.305* 7.270* -- -- --  PCO2ART -- -- -- -- 60.5* 68.0* 81.8* -- -- --  PO2ART -- -- -- -- 68.8* 58.9* 61.0* -- -- --    Lab 08/05/12 0816 08/05/12 0430 08/05/12 0011 08/04/12 1950 08/04/12 1609  GLUCAP 111* 121* 134* 129* 126*    CXR:  Dg Chest Port 1 View  08/05/2012  *RADIOLOGY REPORT*  Clinical Data: Ventilator dependence.  Shortness of breath.  PORTABLE CHEST - 1 VIEW  Comparison: 08/04/2012  Findings: 0453 hours.  Tracheostomy tube remains in place. The NG tube passes into the stomach although the distal tip position is not included on the film. The cardiopericardial silhouette is enlarged.  Diffuse interstitial and basilar airspace disease persist without substantial interval change.  Right pleural effusion has increased slightly in the interval.  Left pleural drain remains in place.  Right PICC line is new in the interval and the tip projects at the mid SVC level.  IMPRESSION: No substantial interval change.  Cardiomegaly with diffuse interstitial and basilar airspace opacities.  New right PICC line tip projects at the mid SVC level.   Original Report Authenticated By: Misty Stanley, M.D.    Dg Chest Port 1 View  08/04/2012  *RADIOLOGY REPORT*  Clinical Data: ARDS  PORTABLE CHEST - 1 VIEW  Comparison:   the previous day's study  Findings: Tracheostomy, nasogastric tube, and left central venous catheter are stable in position.  Moderate diffuse coarse interstitial and alveolar opacities persist.  Left chest tube stable position.  No pneumothorax.  Probable layering pleural effusions, more conspicuous on the right than left.  Heart size within normal limits for technique.  IMPRESSION:  1.  Little change since previous day's portable exam   Original Report Authenticated By: D. Wallace Going, MD      ASSESSMENT / PLAN:  PULMONARY A: Acute respiratory failure with  ARDS 2nd to H1N1/Influenza A and pneumococcal PNA. Hx of smoking with emphysema on CT chest. Failure to wean from ventilator s/p tracheostomy. He has  not been able to wean from ventilator in spite of attempts since tracheostomy 1/30 due to high PEEP/FiO2 demands and mental status. Lt exudative pleural effusion likely from empyema. P:   Adjust PEEP/FiO2 to keep SpO2 > 92% F/u CXR Goal negative fluid balance Continue Lt chest tube BD's PRN  CARDIOVASCULAR A: Septic shock from PNA. Demand ischemia with NSTEMI. 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 Hx of HTN, Hyperlipidemia. P:  Pressors to keep SBP > 90, MAP > 65 Wean off solucortef to after off pressors Continue ASA  RENAL A:  Acute renal failure. Hypokalemia. P:   F/u renal fx, urine outpt F/u and replace electrolytes as needed  GASTROINTESTINAL A:  Nutrition. P:   Tube feeds while on vent  HEMATOLOGIC A:  Anemia of critical illness. Thrombocytopenia. P:  F/u CBC SQ heparin for DVT prophylaxis  INFECTIOUS A:  PNA/bacteremia from influenza and pneumococcus. Lt Empyema. Fever. P:   Abx per ID  ENDOCRINE A:  DM with hyperglycemia. Relative adrenal insufficiency. P:   SSI  NEUROLOGIC A:  Acute encephalopathy. Myoclonus. P:   Neurology signed off >> may need to reconsult Continue dilantin for now Continue ativan for now Continue risperdal for now   DERMATOLOGIC A: Drug rash 2nd to cephalosporins. P: Monitor   TODAY'S SUMMARY:   I have personally obtained a history, examined the patient, evaluated laboratory and imaging results, formulated the assessment and plan and placed orders. CRITICAL CARE: The patient is critically ill with multiple organ systems failure and requires high complexity decision making for assessment and support, frequent evaluation and titration of therapies, application of advanced monitoring technologies and extensive interpretation of multiple databases. Critical Care  Time devoted to patient care services described in this note is 40 minutes.    Chesley Mires, MD [facility] Pulmonary/Critical Care 08/05/2012, 10:37 AM Pager:  [phone] After 3pm call: [phone]

## 2012-08-05 NOTE — Progress Notes (Signed)
EEG completed.

## 2012-08-05 NOTE — Progress Notes (Signed)
Patient ID: [name], male   DOB: 1941/01/17, 72 y.o.   MRN: [id number]   We will keep pt "on radar" for Perc G tube when can safely perform  Need afeb x 48 hrs T: 100.7 again now

## 2012-08-05 NOTE — Progress Notes (Signed)
Trach Team Note  Pt remains on ventilator, per notes, weaning per protocol. Will continue to follow via hart for PMSV readiness. Thanks. Herbie Baltimore, [place] CCC-SLP [phone]

## 2012-08-05 NOTE — Progress Notes (Signed)
ANTIBIOTIC CONSULT NOTE - FOLLOW UP  Pharmacy Consult for Vancomycin, Phenytoin Indication: Strep pneumo bacteremia/empyema, seizures  Allergies  Allergen Reactions  . Cephalosporins Rash    Patient Measurements: Height: 5\' 7"  (170.2 cm) Weight: 243 lb 13.3 oz (110.6 kg) IBW/kg (Calculated) : 66.1  Adjusted Body Weight: 83kg  Vital Signs: Temp: 100.7 F (38.2 C) (02/03 0800) Temp src: Rectal (02/03 0800) BP: 123/72 mmHg (02/03 0830) Pulse Rate: 113  (02/03 0830) Intake/Output from previous day: 02/02 0701 - 02/03 0700 In: 3545.5 [I.V.:599; NG/GT:2260; IV Piggyback:686.5] Out: 3500 [Urine:3080; Stool:300; Chest Tube:120] Intake/Output from this shift: Total I/O In: 100 [I.V.:40; NG/GT:60] Out: 225 [Urine:225]  Labs:  [facility] 08/05/12 0430 08/04/12 0415 08/03/12 1730 08/03/12 0349  WBC 7.0 7.1 7.2 --  HGB 7.7* 7.5* 7.2* --  PLT 143* 116* 108* --  LABCREA -- -- -- --  CREATININE 1.15 1.30 -- 1.39*   Estimated Creatinine Clearance: 70.9 ml/min (by C-G formula based on Cr of 1.15).  Basename 08/02/12 2116  VANCOTROUGH 17.3  VANCOPEAK --  VANCORANDOM --  GENTTROUGH --  GENTPEAK --  GENTRANDOM --  TOBRATROUGH --  TOBRAPEAK --  TOBRARND --  AMIKACINPEAK --  AMIKACINTROU --  AMIKACIN --    Assessment: 71yom continues on vancomycin for strep pneumo bacteremia and empyema s/p thoracentesis 1/29. ARF is improving, CrCl now 44ml/min and UOP 1.1cc/kg/min. He is febrile this morning to 100.7. Previous vancomycin trough was therapeutic on 1750mg  q24 but will need another trough with improving renal function.  Tamiflu 1/18 > 1/28 Vanc 1/18 > 1/23; 1/25 > (LOT for 4-6 wks) (1/31 trough = 17.3) Clinda 1/30 > (LOT x 4-6 wks)  PTA BCx [name]) > strep pneumo (pan sens) 1/18 BCx neg 1/18 Sputum neg (gram stain, GPC in pairs) 1/18 UCx neg 1/26 BCx neg 1/29 pleural fluid cx - neg  Also continues on phenytoin. EEG negative on 2/1.   1/31: suspected seizure, DPH load  ordered 1250mg  IV dose given. Started maintenance dose of 125mg  q8 (~5-6/kg ABW).  2/1 MD checked level = 7.1 - corrects to ~ 16.9; MD did not correct level - gave extra 200 mg 2/2 MD checked level (he said to ensure not toxic) = 6.8, corrected = 17  Goal of Therapy:  Vancomycin trough level 15-20 mcg/ml Total phenytoin level 10-20  Plan:  1) Vancomycin trough tonight prior to 2300 dose 2) Continue phenytoin 125mg  IV q8 - consider another level towards the end of this week  Deboraha Sprang 08/05/2012,10:23 AM

## 2012-08-05 NOTE — Progress Notes (Signed)
ANTIBIOTIC CONSULT NOTE - FOLLOW UP  Pharmacy Consult for vancomycin Indication: Bacteremia/empyema  Labs:  Basename 08/05/12 0430 08/04/12 0415 08/03/12 1730 08/03/12 0349  WBC 7.0 7.1 7.2 --  HGB 7.7* 7.5* 7.2* --  PLT 143* 116* 108* --  LABCREA -- -- -- --  CREATININE 1.15 1.30 -- 1.39*   Estimated Creatinine Clearance: 70.9 ml/min (by C-G formula based on Cr of 1.15).  Basename 08/05/12 2220  VANCOTROUGH 15.6  VANCOPEAK --  VANCORANDOM --  GENTTROUGH --  GENTPEAK --  GENTRANDOM --  TOBRATROUGH --  TOBRAPEAK --  TOBRARND --  AMIKACINPEAK --  AMIKACINTROU --  AMIKACIN --     Assessment: 72yo male with ~therapeutic vanc trough though last dose was given late so true trough likely slightly below goal.  Goal of Therapy:  Vancomycin trough level 15-20 mcg/ml  Plan:  Will increase vancomycin slightly to 1500mg  IV Q24H and continue to monitor.  Rogue Bussing PharmD BCPS 08/05/2012,11:35 PM

## 2012-08-06 ENCOUNTER — Inpatient Hospital Stay (HOSPITAL_COMMUNITY): Payer: BC Managed Care – PPO

## 2012-08-06 LAB — BASIC METABOLIC PANEL
Calcium: 8.6 mg/dL (ref 8.4–10.5)
Creatinine, Ser: 1.08 mg/dL (ref 0.50–1.35)
GFR calc non Af Amer: 68 mL/min — ABNORMAL LOW (ref >90)
Sodium: 149 mEq/L — ABNORMAL HIGH (ref 135–145)

## 2012-08-06 LAB — POCT I-STAT 3, ART BLOOD GAS (G3+)
Bicarbonate: 36.1 mEq/L — ABNORMAL HIGH (ref 20.0–24.0)
Patient temperature: 99.8
TCO2: 38 mmol/L (ref 0–100)
pCO2 arterial: 55 mmHg — ABNORMAL HIGH (ref 35.0–45.0)
pH, Arterial: 7.428 (ref 7.350–7.450)
pO2, Arterial: 73 mmHg — ABNORMAL LOW (ref 80.0–100.0)

## 2012-08-06 LAB — CBC
MCH: 29.5 pg (ref 26.0–34.0)
Platelets: 166 10*3/uL (ref 150–400)
RBC: 2.58 MIL/uL — ABNORMAL LOW (ref 4.22–5.81)
RDW: 15.6 % — ABNORMAL HIGH (ref 11.5–15.5)
WBC: 6.8 10*3/uL (ref 4.0–10.5)

## 2012-08-06 LAB — BLOOD GAS, ARTERIAL
Acid-Base Excess: 9.6 mmol/L — ABNORMAL HIGH (ref 0.0–2.0)
Drawn by: 28701
MECHVT: 400 mL
O2 Saturation: 94.3 %
PEEP: 8 cmH2O
RATE: 26 resp/min
pO2, Arterial: 61.7 mmHg — ABNORMAL LOW (ref 80.0–100.0)

## 2012-08-06 LAB — GLUCOSE, CAPILLARY
Glucose-Capillary: 111 mg/dL — ABNORMAL HIGH (ref 70–99)
Glucose-Capillary: 118 mg/dL — ABNORMAL HIGH (ref 70–99)
Glucose-Capillary: 119 mg/dL — ABNORMAL HIGH (ref 70–99)
Glucose-Capillary: 129 mg/dL — ABNORMAL HIGH (ref 70–99)
Glucose-Capillary: 133 mg/dL — ABNORMAL HIGH (ref 70–99)
Glucose-Capillary: 140 mg/dL — ABNORMAL HIGH (ref 70–99)

## 2012-08-06 MED ORDER — PHENYTOIN 125 MG/5ML PO SUSP
125.0000 mg | Freq: Three times a day (TID) | ORAL | Status: DC
  Administered 2012-08-06 – 2012-08-09 (×11): 125 mg
  Filled 2012-08-06 (×14): qty 5

## 2012-08-06 MED ORDER — RISPERIDONE 1 MG/ML PO SOLN
1.0000 mg | Freq: Every day | ORAL | Status: DC
  Administered 2012-08-06: 1 mg via ORAL
  Filled 2012-08-06 (×2): qty 1

## 2012-08-06 MED ORDER — FUROSEMIDE 10 MG/ML IJ SOLN
INTRAMUSCULAR | Status: AC
  Administered 2012-08-06: 40 mg
  Filled 2012-08-06: qty 4

## 2012-08-06 MED ORDER — HYDROCORTISONE SOD SUCCINATE 100 MG IJ SOLR
50.0000 mg | Freq: Two times a day (BID) | INTRAMUSCULAR | Status: DC
  Administered 2012-08-06 – 2012-08-07 (×2): 50 mg via INTRAVENOUS
  Filled 2012-08-06 (×4): qty 1

## 2012-08-06 MED ORDER — POTASSIUM CHLORIDE CRYS ER 20 MEQ PO TBCR: 40.0000 meq | EXTENDED_RELEASE_TABLET | Freq: Four times a day (QID) | ORAL | Status: DC

## 2012-08-06 MED ORDER — POTASSIUM CHLORIDE 20 MEQ/15ML (10%) PO LIQD
40.0000 meq | Freq: Once | ORAL | Status: AC
  Administered 2012-08-06: 40 meq
  Filled 2012-08-06: qty 30

## 2012-08-06 MED ORDER — POTASSIUM CHLORIDE 20 MEQ/15ML (10%) PO LIQD
40.0000 meq | Freq: Four times a day (QID) | ORAL | Status: AC
  Administered 2012-08-06: 40 meq via ORAL
  Filled 2012-08-06 (×2): qty 30

## 2012-08-06 MED ORDER — POTASSIUM CHLORIDE 20 MEQ/15ML (10%) PO LIQD
ORAL | Status: AC
  Administered 2012-08-06: 40 meq
  Filled 2012-08-06: qty 30

## 2012-08-06 MED ORDER — LORAZEPAM 2 MG/ML IJ SOLN
1.0000 mg | Freq: Three times a day (TID) | INTRAMUSCULAR | Status: DC
  Administered 2012-08-06 – 2012-08-07 (×2): 1 mg via INTRAVENOUS
  Filled 2012-08-06 (×2): qty 1

## 2012-08-06 MED ORDER — FUROSEMIDE 10 MG/ML IJ SOLN
40.0000 mg | Freq: Four times a day (QID) | INTRAMUSCULAR | Status: AC
  Administered 2012-08-06: 40 mg via INTRAVENOUS
  Filled 2012-08-06: qty 4

## 2012-08-06 NOTE — Progress Notes (Signed)
[facility] ADULT ICU REPLACEMENT PROTOCOL FOR AM LAB REPLACEMENT ONLY  The patient does not apply for the [facility] Adult ICU Electrolyte Replacment Protocol based on the criteria listed below:   1. Is GFR >/= 40 ml/min? yes  Patient's GFR today is  2. Is urine output >/= 0.5 ml/kg/hr for the last 6 hours? yes Patient's UOP is  ml/kg/hr 3. Is BUN < 60 mg/dL? no  Patient's BUN today is 88 4. Abnormal electrolyte(s): K 3.2 5. Ordered repletion with:   6. If a panic level lab has been reported, has the CCM MD in charge been notified? yes.   Physician:  Dr Jimmy Footman   Orvil Feil, [place] Skipper A 08/06/2012 6:12 AM

## 2012-08-06 NOTE — Progress Notes (Signed)
[facility] Progress Note Patient Name: [name] DOB: 07-19-1940 MRN: [id number]  Date of Service  08/06/2012   HPI/Events of Note   hypokalemia  eICU Interventions  Potassium replaced   Intervention Category Intermediate Interventions: Electrolyte abnormality - evaluation and management  DETERDING,ELIZABETH 08/06/2012, 6:09 AM

## 2012-08-06 NOTE — Progress Notes (Signed)
NUTRITION FOLLOW UP  Intervention:    Continue Oxepa at 30 ml/h with Prostat 60 ml TID to provide 1680 kcals (25 kcals/kg ideal weight), 135 gm protein, 565 ml free water daily.  Continue liquid MVI via tube daily to meet 100% DRI's.  Free water adjustments per physician.  Nutrition Dx:   Inadequate oral intake related to inability to eat as evidenced by NPO status, ongoing.  Goal:   Enteral nutrition to provide 60-70% of estimated calorie needs (22-25 kcals/kg ideal body weight) and >/= 90% of estimated protein needs, based on ASPEN guidelines for permissive underfeeding in critically ill obese individuals, met.  Monitor:   TF tolerance/adequacy, weight trend, labs, vent status.  Assessment:   Patient with sepsis related to multilobar PNA, H1N1 infection, and severe ARDS.  S/P tracheostomy 1/30.  Now with left exudative pleural effusion likely from empyema.  Goal is negative fluid balance.  Patient remains on ventilator support.  MV: 13.6 Temp:Temp (24hrs), Avg:100.5 F (38.1 C), Min:99.8 F (37.7 C), Max:101.1 F (38.4 C)  Tolerating TF well via OG tube.    Height: Ht Readings from Last 1 Encounters:  07/20/12 5\' 7"  (1.702 m)    Weight Status:   Wt Readings from Last 1 Encounters:  08/06/12 243 lb 13.3 oz (110.6 kg)   Weight is fluctuating with fluid status.  07/28/12  231 lb 7.7 oz (105 kg)   07/22/12  242 lb 8.1 oz (110 kg)   Re-estimated needs:  Kcal: 2120 Protein: >134 gm Fluid: 2.1-2.2 L  Skin: skin tear on right buttocks  Diet Order: NPO; Oxepa at 30 ml/h with Prostat 60 ml TID providing 1680 kcals (25 kcals/kg ideal weight), 135 gm protein, 565 ml free water daily.  Free water flushes:  400 ml every 4 hours providing an extra 2400 ml of free water daily  Intake/Output Summary (Last 24 hours) at 08/06/12 0927 Last data filed at 08/06/12 0900  Gross per 24 hour  Intake 4497.5 ml  Output   3515 ml  Net  982.5 ml    Last BM: 2/3   Labs:   Lab  08/06/12 0445 08/05/12 0430 08/04/12 0415 08/03/12 0349  NA 149* 147* 145 --  K 3.2* 3.6 3.7 --  CL 109 108 105 --  CO2 37* 36* 35* --  BUN 88* 99* 106* --  CREATININE 1.08 1.15 1.30 --  CALCIUM 8.6 8.7 8.8 --  MG -- 2.1 2.1 2.0  PHOS -- 4.3 4.1 4.5  GLUCOSE 132* 120* 109* --    CBG (last 3)   Basename 08/06/12 0738 08/06/12 0426 08/06/12 0006  GLUCAP 110* 140* 119*    Scheduled Meds:    . antiseptic oral rinse  15 mL Mouth Rinse QID  . aspirin  81 mg Per Tube Daily  . chlorhexidine  15 mL Mouth Rinse BID  . clindamycin (CLEOCIN) IV  300 mg Intravenous Q8H  . feeding supplement (OXEPA)  1,000 mL Per Tube Q24H  . feeding supplement  60 mL Per Tube TID  . free water  400 mL Per Tube Q4H  . hydrocortisone sod succinate (SOLU-CORTEF) injection  50 mg Intravenous Q8H  . insulin aspart  0-20 Units Subcutaneous Q4H  . insulin glargine  30 Units Subcutaneous Q24H  . LORazepam  1 mg Intravenous Q6H  . LORazepam  2 mg Intravenous Once  . magnesium sulfate 1 - 4 g bolus IVPB  2 g Intravenous Once  . multivitamin  5 mL Per Tube Daily  .  pantoprazole sodium  40 mg Per Tube Daily  . phenytoin (DILANTIN) IV  125 mg Intravenous Q8H  . propofol  5-70 mcg/kg/min Intravenous Once  . risperiDONE  1 mg Oral BID  . sodium chloride  10-40 mL Intracatheter Q12H  . vancomycin  1,500 mg Intravenous Q24H    Continuous Infusions:    . sodium chloride 20 mL/hr at 08/05/12 0730  . fentaNYL infusion INTRAVENOUS 100 mcg/hr (08/05/12 1000)  . norepinephrine (LEVOPHED) Adult infusion Stopped (08/03/12 2300)    Molli Barrows, RD, LDN, CNSC Pager# [phone] After Hours Pager# [phone]

## 2012-08-06 NOTE — Progress Notes (Signed)
PULMONARY  / CRITICAL CARE MEDICINE  Name: [name] MRN: [id number] DOB: 01-26-1941    ADMISSION DATE:  07/20/2012 CONSULTATION DATE:  1/18/20014   REFERRING MD :  [name] ER  CHIEF COMPLAINT:  Dyspnea  BRIEF PATIENT DESCRIPTION:  49 male smoker transferred from [facility] 07/20/12 with Sepsis, ARDS with PNA from H1N1/Influenza A and pneumococcus.  Significant PMHx of HTN, OSA, Hyperlipidemia, pre-DM  SIGNIFICANT EVENTS: 1/18 Transfer to [facility] from [place] for respiratory failure started on Nimbex protocol for SEVERE ARDS.  1/27 ct chest- free flowing rt effusion small, partial loculated effusion left small  1/29 diagnostic left thora, exudative  1/30 fevers continued, chest tube placement with bleeding. 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 1/27 CT head >> mild atrophy 1/27 CT chest >> atherosclerosis, centrilobular/paraseptal emphysema with subpleural blebs and few bulla, patchy ASD b/l Rt > Lt, LLL consolidation, partially loculated Lt pleural effusion 1/31 CT head >> no acute findings 2/01 EEG >> severe encephalopathy, no epileptiform activity  LINES / TUBES: ETT 1/18 >> 1/31 Trach (DF ) 1/30 >>  Lt chest tube 1/30 >>  Rt PICC 2/02 >>  CULTURES: 1/18 Blood [name]) >> pneumococcus 1/20 Respiratory viral panel >> Influenza A positive 1/26 Blood >> negative 2/02 Cath tip >>   ANTIBIOTICS: 1/18 Vanc >> 1/23 1/18 Levaquin >> 1/23  1/18 Tamiflu >> 1/28  1/18 Cefepime >>1/23  1/23 Rocephin 1/23>>> 1/23  1/24 Levofloxacin >>> 1/27 1/25 Vancomycin >>  1/30 Clindamycin >>   SUBJECTIVE:  Remains on high PEEP/FiO2.  Sedated.  Family reports that he is very sensitive to sedating medications.  VITAL SIGNS: Temp:  [99.9 F (37.7 C)-101.1 F (38.4 C)] 99.9 F (37.7 C) (02/04 0400) Pulse Rate:  [98-121] 98  (02/04 0700) Resp:  [28-36] 36  (02/04 0328) BP:  (86-135)/(54-91) 114/73 mmHg (02/04 0700) SpO2:  [91 %-100 %] 95 % (02/04 0700) FiO2 (%):  [50 %-60 %] 50 % (02/04 0328) Weight:  [243 lb 13.3 oz (110.6 kg)] 243 lb 13.3 oz (110.6 kg) (02/04 0328) HEMODYNAMICS: CVP:  [10 mmHg-24 mmHg] 10 mmHg VENTILATOR SETTINGS: Vent Mode:  [-] PRVC FiO2 (%):  [50 %-60 %] 50 % Set Rate:  [26 bmp] 26 bmp Vt Set:  [400 mL] 400 mL PEEP:  [8 cmH20-10 cmH20] 8 cmH20 Plateau Pressure:  [14 cmH20-21 cmH20] 18 cmH20 INTAKE / OUTPUT: Intake/Output      02/03 0701 - 02/04 0700 02/04 0701 - 02/05 0700   I.V. (mL/kg) 527.5 (4.8)    Other 200    NG/GT 2820    IV Piggyback 950    Total Intake(mL/kg) 4497.5 (40.7)    Urine (mL/kg/hr) 2960 (1.1)    Stool 450    Chest Tube 105    Total Output 3515    Net +982.5           PHYSICAL EXAMINATION: General:  Ill appearing Neuro:  Unresponsive HEENT:  Trach site clean Cardiovascular:  s1s2 regular, tachycardic Lungs:  B/l rhonchi, left chest tube in place Abdomen:  Soft, non tender Musculoskeletal:  2+ edema all extremities Skin:  No rashes  LABS:  Lab 08/06/12 0532 08/06/12 0445 08/05/12 0430 08/04/12 0415 08/03/12 0349 08/03/12 0337 08/02/12 2248 08/01/12 1900 07/31/12 1622  HGB -- 7.6* 7.7* 7.5* -- -- -- -- --  WBC -- 6.8 7.0 7.1 -- -- -- -- --  PLT -- 166 143* 116* -- -- -- -- --  NA -- 149* 147* 145 -- -- -- -- --  K -- 3.2* 3.6 -- -- -- -- -- --  CL -- 109 108 105 -- -- -- -- --  CO2 -- 37* 36* 35* -- -- -- -- --  GLUCOSE -- 132* 120* 109* -- -- -- -- --  BUN -- 88* 99* 106* -- -- -- -- --  CREATININE -- 1.08 1.15 1.30 -- -- -- -- --  CALCIUM -- 8.6 8.7 8.8 -- -- -- -- --  MG -- -- 2.1 2.1 2.0 -- -- -- --  PHOS -- -- 4.3 4.1 4.5 -- -- -- --  AST -- -- -- -- -- -- -- -- 42*  ALT -- -- -- -- -- -- -- -- 56*  ALKPHOS -- -- -- -- -- -- -- -- 59  BILITOT -- -- -- -- -- -- -- -- 0.4  PROT -- -- -- -- -- -- -- -- 5.6*  ALBUMIN -- -- -- 1.5* -- -- -- -- 1.6*  APTT -- -- -- -- -- -- -- -- --  INR  -- -- 1.14 -- -- -- -- -- --  LATICACIDVEN -- -- -- -- -- -- -- 1.4 --  TROPONINI -- -- -- -- -- -- -- -- --  PROCALCITON -- -- -- -- -- -- -- -- --  PROBNP -- -- -- -- -- -- -- -- --  O2SATVEN -- -- -- -- -- -- -- -- --  PHART 7.409 -- -- -- -- 7.367 7.305* -- --  PCO2ART 55.8* -- -- -- -- 60.5* 68.0* -- --  PO2ART 61.7* -- -- -- -- 68.8* 58.9* -- --    Lab 08/06/12 0426 08/06/12 0006 08/05/12 1956 08/05/12 1623 08/05/12 1149  GLUCAP 140* 119* 122* 141* 149*    CXR:  Dg Chest Port 1 View  08/05/2012  *RADIOLOGY REPORT*  Clinical Data: Ventilator dependence.  Shortness of breath.  PORTABLE CHEST - 1 VIEW  Comparison: 08/04/2012  Findings: 0453 hours.  Tracheostomy tube remains in place. The NG tube passes into the stomach although the distal tip position is not included on the film. The cardiopericardial silhouette is enlarged.  Diffuse interstitial and basilar airspace disease persist without substantial interval change.  Right pleural effusion has increased slightly in the interval.  Left pleural drain remains in place.  Right PICC line is new in the interval and the tip projects at the mid SVC level.  IMPRESSION: No substantial interval change.  Cardiomegaly with diffuse interstitial and basilar airspace opacities.  New right PICC line tip projects at the mid SVC level.   Original Report Authenticated By: Misty Stanley, M.D.      ASSESSMENT / PLAN:  PULMONARY A: Acute respiratory failure with ARDS 2nd to H1N1/Influenza A and pneumococcal PNA. Hx of smoking with emphysema on CT chest. Failure to wean from ventilator s/p tracheostomy. He has not been able to wean from ventilator in spite of attempts since tracheostomy 1/30 due to high PEEP/FiO2 demands and mental status. Lt exudative pleural effusion likely from empyema. P:   Adjust PEEP/FiO2 to keep SpO2 > 92% F/u CXR Goal negative fluid balance Continue Lt chest tube BD's PRN Will wean sedating medications  CARDIOVASCULAR A:  Septic shock from PNA. Demand ischemia with NSTEMI. 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 Hx of HTN, Hyperlipidemia. P:  Pressors to keep SBP > 90, MAP > 65 decrease solucortef (off  pressors since 2/1) Continue ASA  RENAL A:  Acute renal failure. - improving Hypokalemia. P:   F/u renal fx, urine outpt F/u and replace electrolytes as needed  GASTROINTESTINAL A:  Nutrition. P:   Tube feeds while on vent  HEMATOLOGIC A:  Anemia of critical illness. Thrombocytopenia. P:  F/u CBC SQ heparin for DVT prophylaxis  INFECTIOUS A:  PNA/bacteremia from influenza and pneumococcus. Lt Empyema. Fever. P:   Abx per ID  ENDOCRINE A:  DM with hyperglycemia. Relative adrenal insufficiency. P:   SSI Decrease solucortef to q12hr  NEUROLOGIC A:  Acute encephalopathy. Myoclonus. - ? resolved P:   Neurology signed off >> may need to reconsult Continue dilantin for now wean ativan to q8hr Wean risperdal to qHS   DERMATOLOGIC A: Drug rash 2nd to cephalosporins. P: Monitor   TODAY'S SUMMARY:   BOOTH, [place], PGY-2 08/06/2012, 8:03 AM   Reviewed above, examined pt, and agree with assessment/plan.  He is not able to tolerate vent weaning.  Will try diuresis.  Will gradual taper risperdal and ativan to better assess mental status.  Abx per ID.  Will likely need LTAC placement.  Keep chest tube in for now.  Updated family at bedside.  CC time 35 minutes.  Chesley Mires, MD [facility] Pulmonary/Critical Care 08/06/2012, 3:32 PM Pager:  [phone] After 3pm call: [phone]

## 2012-08-06 NOTE — Progress Notes (Signed)
Patient ID: [name], male   DOB: [birth year], 72 y.o.   MRN: [id number]    [facility]    Date of Admission:  07/20/2012           Day 18 vancomycin        Day 5 clindamycin Active Problems:  Acute respiratory failure with hypoxia  ARDS (adult respiratory distress syndrome)  Acute renal failure  Other convulsions  Abnormal involuntary movement      . antiseptic oral rinse  15 mL Mouth Rinse QID  . aspirin  81 mg Per Tube Daily  . chlorhexidine  15 mL Mouth Rinse BID  . clindamycin (CLEOCIN) IV  300 mg Intravenous Q8H  . feeding supplement (OXEPA)  1,000 mL Per Tube Q24H  . feeding supplement  60 mL Per Tube TID  . free water  400 mL Per Tube Q4H  . furosemide  40 mg Intravenous Q6H  . hydrocortisone sod succinate (SOLU-CORTEF) injection  50 mg Intravenous Q12H  . insulin aspart  0-20 Units Subcutaneous Q4H  . insulin glargine  30 Units Subcutaneous Q24H  . LORazepam  1 mg Intravenous Q8H  . magnesium sulfate 1 - 4 g bolus IVPB  2 g Intravenous Once  . multivitamin  5 mL Per Tube Daily  . pantoprazole sodium  40 mg Per Tube Daily  . phenytoin  125 mg Per Tube TID  . potassium chloride  40 mEq Oral Q6H  . propofol  5-70 mcg/kg/min Intravenous Once  . risperiDONE  1 mg Oral QHS  . sodium chloride  10-40 mL Intracatheter Q12H  . vancomycin  1,500 mg Intravenous Q24H    Objective: Temp:  [99.8 F (37.7 C)-100.8 F (38.2 C)] 100.4 F (38 C) (02/04 1200) Pulse Rate:  [98-115] 113  (02/04 1200) Resp:  [28-42] 42  (02/04 1125) BP: (104-135)/(64-99) 125/81 mmHg (02/04 1200) SpO2:  [91 %-100 %] 95 % (02/04 1200) FiO2 (%):  [50 %] 50 % (02/04 1200) Weight:  [243 lb 13.3 oz (110.6 kg)] 243 lb 13.3 oz (110.6 kg) (02/04 0328)  General: Remains unresponsive on the ventilator Skin: Family at bedside noting that rash has improved greatly Lungs:  clear anteriorly Cor:  regular S1 and S2 and no murmur heard Abdomen:  soft nontender; diarrhea  persists  Lab Results Lab Results  Component Value Date   WBC 6.8 08/06/2012   HGB 7.6* 08/06/2012   HCT 24.7* 08/06/2012   MCV 95.7 08/06/2012   PLT 166 08/06/2012    Lab Results  Component Value Date   CREATININE 1.08 08/06/2012   BUN 88* 08/06/2012   NA 149* 08/06/2012   K 3.2* 08/06/2012   CL 109 08/06/2012   CO2 37* 08/06/2012    Lab Results  Component Value Date   ALT 56* 07/31/2012   AST 42* 07/31/2012   ALKPHOS 59 07/31/2012   BILITOT 0.4 07/31/2012     Assessment: He continues to have low-grade fevers. He recent microbiology studies have not revealed any new pathogens to target. I cannot rule out healthcare associated pneumonia. He is also still have residual fever from a recent allergy to cephalosporins.  Plan: 1. Continue vancomycin 2. Discontinue clindamycin  3. Discuss discuss with CCM

## 2012-08-07 ENCOUNTER — Inpatient Hospital Stay (HOSPITAL_COMMUNITY): Payer: BC Managed Care – PPO

## 2012-08-07 LAB — CBC
HCT: 25.3 % — ABNORMAL LOW (ref 39.0–52.0)
Hemoglobin: 7.9 g/dL — ABNORMAL LOW (ref 13.0–17.0)
RBC: 2.65 MIL/uL — ABNORMAL LOW (ref 4.22–5.81)
WBC: 7.2 10*3/uL (ref 4.0–10.5)

## 2012-08-07 LAB — BLOOD GAS, ARTERIAL
Acid-Base Excess: 9.6 mmol/L — ABNORMAL HIGH (ref 0.0–2.0)
Bicarbonate: 33.5 mEq/L — ABNORMAL HIGH (ref 20.0–24.0)
Drawn by: 20517
FIO2: 0.4 %
PEEP: 5 cmH2O
pCO2 arterial: 43.6 mmHg (ref 35.0–45.0)
pO2, Arterial: 49.7 mmHg — ABNORMAL LOW (ref 80.0–100.0)

## 2012-08-07 LAB — BASIC METABOLIC PANEL
Chloride: 113 mEq/L — ABNORMAL HIGH (ref 96–112)
GFR calc Af Amer: >90 mL/min (ref >90)
GFR calc non Af Amer: 82 mL/min — ABNORMAL LOW (ref >90)
Potassium: 3.4 mEq/L — ABNORMAL LOW (ref 3.5–5.1)
Sodium: 150 mEq/L — ABNORMAL HIGH (ref 135–145)

## 2012-08-07 LAB — CATH TIP CULTURE

## 2012-08-07 LAB — GLUCOSE, CAPILLARY
Glucose-Capillary: 105 mg/dL — ABNORMAL HIGH (ref 70–99)
Glucose-Capillary: 97 mg/dL (ref 70–99)
Glucose-Capillary: 107 mg/dL — ABNORMAL HIGH (ref 70–99)
Glucose-Capillary: 93 mg/dL (ref 70–99)
Glucose-Capillary: 63 mg/dL — ABNORMAL LOW (ref 70–99)

## 2012-08-07 MED ORDER — FENTANYL BOLUS VIA INFUSION
25.0000 ug | INTRAVENOUS | Status: DC | PRN
  Filled 2012-08-07: qty 100

## 2012-08-07 MED ORDER — DEXTROSE 50 % IV SOLN
INTRAVENOUS | Status: AC
  Administered 2012-08-07: 50 mL
  Filled 2012-08-07: qty 50

## 2012-08-07 MED ORDER — LORAZEPAM 2 MG/ML IJ SOLN
0.5000 mg | Freq: Three times a day (TID) | INTRAMUSCULAR | Status: DC
  Administered 2012-08-07 – 2012-08-08 (×2): 0.5 mg via INTRAVENOUS
  Filled 2012-08-07 (×2): qty 1

## 2012-08-07 MED ORDER — POTASSIUM CHLORIDE 20 MEQ/15ML (10%) PO LIQD
60.0000 meq | Freq: Once | ORAL | Status: AC
  Administered 2012-08-07: 60 meq via ORAL
  Filled 2012-08-07: qty 45

## 2012-08-07 MED ORDER — FREE WATER
500.0000 mL | Status: DC
  Administered 2012-08-07 – 2012-08-09 (×14): 500 mL

## 2012-08-07 NOTE — Progress Notes (Signed)
Patient ID: [name], male   DOB: 1941-01-05, 72 y.o.   MRN: [id number]    [facility] for Infectious Disease    Date of Admission:  07/20/2012   Total days of antibiotics 19         Active Problems:  Acute respiratory failure with hypoxia  ARDS (adult respiratory distress syndrome)  Acute renal failure  Other convulsions  Abnormal involuntary movement      . antiseptic oral rinse  15 mL Mouth Rinse QID  . aspirin  81 mg Per Tube Daily  . chlorhexidine  15 mL Mouth Rinse BID  . feeding supplement (OXEPA)  1,000 mL Per Tube Q24H  . feeding supplement  60 mL Per Tube TID  . free water  500 mL Per Tube Q4H  . insulin aspart  0-20 Units Subcutaneous Q4H  . insulin glargine  30 Units Subcutaneous Q24H  . LORazepam  0.5 mg Intravenous Q8H  . magnesium sulfate 1 - 4 g bolus IVPB  2 g Intravenous Once  . multivitamin  5 mL Per Tube Daily  . pantoprazole sodium  40 mg Per Tube Daily  . phenytoin  125 mg Per Tube TID  . propofol  5-70 mcg/kg/min Intravenous Once  . sodium chloride  10-40 mL Intracatheter Q12H  . vancomycin  1,500 mg Intravenous Q24H   Objective: Temp:  [98 F (36.7 C)-100.4 F (38 C)] 98 F (36.7 C) (02/05 0745) Pulse Rate:  [104-116] 115  (02/05 1121) Resp:  [22-36] 22  (02/05 1121) BP: (106-130)/(62-94) 111/80 mmHg (02/05 1121) SpO2:  [92 %-98 %] 97 % (02/05 1121) FiO2 (%):  [40 %-50 %] 40 % (02/05 1121) Weight:  [109 kg (240 lb 4.8 oz)] 109 kg (240 lb 4.8 oz) (02/05 0500)  General: He is alert today Skin: Rash has faded Lungs: Clear anteriorly Cor: Regular S1 and S2 with no murmurs Abdomen: Soft Anasarca persists  Lab Results Lab Results  Component Value Date   WBC 7.2 08/07/2012   HGB 7.9* 08/07/2012   HCT 25.3* 08/07/2012   MCV 95.5 08/07/2012   PLT 173 08/07/2012    Microbiology: Recent Results (from the past 240 hour(s))  BODY FLUID CULTURE     Status: Normal   Collection Time   07/31/12 11:47 AM      Component Value Range Status Comment   Specimen Description PLEURAL FLUID LEFT   Final    Special Requests 3.0 ML FLUID   Final    Gram Stain     Final    Value: MODERATE WBC PRESENT,BOTH PMN AND MONONUCLEAR     NO ORGANISMS SEEN   Culture NO GROWTH 3 DAYS   Final    Report Status 08/03/2012 FINAL   Final   CULTURE, RESPIRATORY     Status: Normal   Collection Time   08/01/12  3:20 PM      Component Value Range Status Comment   Specimen Description TRACHEAL ASPIRATE   Final    Special Requests NONE   Final    Gram Stain     Final    Value: FEW WBC PRESENT,BOTH PMN AND MONONUCLEAR     NO ORGANISMS SEEN   Culture NO GROWTH 2 DAYS   Final    Report Status 08/03/2012 FINAL   Final   BODY FLUID CULTURE     Status: Normal   Collection Time   08/01/12  6:31 PM      Component Value Range Status Comment   Specimen  Description PLEURAL FLUID LEFT   Final    Special Requests NONE   Final    Gram Stain     Final    Value: FEW WBC PRESENT,BOTH PMN AND MONONUCLEAR     NO ORGANISMS SEEN   Culture NO GROWTH 3 DAYS   Final    Report Status 08/05/2012 FINAL   Final   CLOSTRIDIUM DIFFICILE BY PCR     Status: Normal   Collection Time   08/01/12  7:45 PM      Component Value Range Status Comment   C difficile by pcr NEGATIVE  NEGATIVE Final   CATH TIP CULTURE     Status: Normal   Collection Time   08/04/12  4:28 PM      Component Value Range Status Comment   Specimen Description CATH TIP CENTRAL LINE   Final    Special Requests NONE   Final    Culture NO GROWTH 2 DAYS   Final    Report Status 08/07/2012 FINAL   Final     Studies/Results: Dg Chest Port 1 View  08/06/2012  *RADIOLOGY REPORT*  Clinical Data: Follow up of pneumonia and empyema.  Shortness of breath.  PORTABLE CHEST - 1 VIEW  Comparison: 1 day prior  Findings: Nasogastric tube extends beyond the  inferior aspect of the film.  Tracheostomy appropriately positioned.  Right-sided PICC line unchanged.  Mild cardiomegaly.  Layering bilateral pleural effusions.  The left- sided  effusion is likely increased.  There may be minimal loculation laterally.  Left-sided pleural drain is in place.  No pneumothorax.  Lower lobe predominant interstitial and airspace disease is minimally progressive.  IMPRESSION: Slight worsening in aeration with increased interstitial and air space disease.  Likely a combination of pulmonary edema and infection.  Similar right and increased left-sided pleural effusion.  Question lateral loculation of the left pleural effusions; pleural catheter in place.   Original Report Authenticated By: Abigail Miyamoto, M.D.     Assessment: He has now completed 2 and half weeks of antibiotic therapy and I do not see any clear evidence of active infection.  Plan: 1. Discontinue vancomycin and observe off of antibiotics 2. Case discussed with patient's family and Dr. Renita Papa, [place] [facility] [place] [phone] pager   [phone] cell 08/07/2012, 11:44 AM

## 2012-08-07 NOTE — Progress Notes (Signed)
[facility] ADULT ICU REPLACEMENT PROTOCOL FOR AM LAB REPLACEMENT ONLY  The patient does not apply for the [facility] Adult ICU Electrolyte Replacment Protocol based on the criteria listed below:     Is BUN < 60 mg/dL? no  Patient's BUN today is [phone]  Vear Clock 08/07/2012 6:51 AM

## 2012-08-07 NOTE — Progress Notes (Signed)
PULMONARY  / CRITICAL CARE MEDICINE  Name: [name] MRN: [id number] DOB: 06-03-1941    ADMISSION DATE:  07/20/2012 CONSULTATION DATE:  1/18/20014   REFERRING MD :  [name]  CHIEF COMPLAINT:  Dyspnea  BRIEF PATIENT DESCRIPTION:  37 male smoker transferred from [facility] 07/20/12 with Sepsis, ARDS with PNA from H1N1/Influenza A and pneumococcus.  Significant PMHx of HTN, OSA, Hyperlipidemia, pre-DM  SIGNIFICANT EVENTS: 1/18 Transfer to [facility] from [place] for respiratory failure started on Nimbex protocol for SEVERE ARDS.  1/27 ct chest- free flowing rt effusion small, partial loculated effusion left small  1/29 diagnostic left thora, exudative  1/30 fevers continued, chest tube placement with bleeding. 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 1/27 CT head >> mild atrophy 1/27 CT chest >> atherosclerosis, centrilobular/paraseptal emphysema with subpleural blebs and few bulla, patchy ASD b/l Rt > Lt, LLL consolidation, partially loculated Lt pleural effusion 1/31 CT head >> no acute findings 2/01 EEG >> severe encephalopathy, no epileptiform activity  LINES / TUBES: ETT 1/18 >> 1/31 Trach (DF ) 1/30 >>  Lt chest tube 1/30 >>  Rt PICC 2/02 >>  CULTURES: 1/18 Blood [name]) >> pneumococcus 1/20 Respiratory viral panel >> Influenza A positive 1/26 Blood >> negative 2/02 Cath tip >>   ANTIBIOTICS: 1/18 Vanc >> 1/23 1/18 Levaquin >> 1/23  1/18 Tamiflu >> 1/28  1/18 Cefepime >>1/23  1/23 Rocephin 1/23>>> 1/23  1/24 Levofloxacin >>> 1/27 1/25 Vancomycin >>  1/30 Clindamycin >> 2/4  SUBJECTIVE:  Remains on high PEEP/FiO2.  Sedated.  Per RN report, he was more arousable with decreased ativan and risperdal.  VITAL SIGNS: Temp:  [98 F (36.7 C)-100.4 F (38 C)] 98 F (36.7 C) (02/05 0745) Pulse Rate:  [104-116] 116  (02/05 0700) Resp:  [28-42] 36  (02/05  0352) BP: (106-130)/(62-99) 119/94 mmHg (02/05 0700) SpO2:  [92 %-98 %] 94 % (02/05 0700) FiO2 (%):  [40 %-50 %] 40 % (02/05 0600) Weight:  [240 lb 4.8 oz (109 kg)] 240 lb 4.8 oz (109 kg) (02/05 0500) HEMODYNAMICS: CVP:  [7 mmHg] 7 mmHg VENTILATOR SETTINGS: Vent Mode:  [-] PRVC FiO2 (%):  [40 %-50 %] 40 % Set Rate:  [26 bmp-28 bmp] 28 bmp Vt Set:  [400 mL] 400 mL PEEP:  [8 cmH20] 8 cmH20 Plateau Pressure:  [19 cmH20-22 cmH20] 20 cmH20 INTAKE / OUTPUT: Intake/Output      02/04 0701 - 02/05 0700 02/05 0701 - 02/06 0700   I.V. (mL/kg) 590 (5.4)    Other     NG/GT 3060    IV Piggyback 510    Total Intake(mL/kg) 4160 (38.2)    Urine (mL/kg/hr) 4125 (1.6)    Stool 525    Chest Tube     Total Output 4650    Net -490           PHYSICAL EXAMINATION: General:  Ill appearing Neuro:  Unresponsive HEENT:  Trach site clean Cardiovascular:  s1s2 regular, tachycardic Lungs: coarse breath sounds, left chest tube in place Abdomen:  Soft, non tender Musculoskeletal:  2+ edema all extremities Skin:  No rashes  LABS:  Lab 08/07/12 0419 08/06/12 1736 08/06/12 0532 08/06/12 0445 08/05/12 0430 08/04/12 0415 08/03/12 0349 08/03/12 0337 08/01/12 1900 07/31/12 1622  HGB 7.9* -- -- 7.6* 7.7* -- -- -- -- --  WBC 7.2 -- -- 6.8 7.0 -- -- -- -- --  PLT 173 -- -- 166 143* -- -- -- -- --  NA 150* -- -- 149* 147* -- -- -- -- --  K 3.4* -- -- 3.2* -- -- -- -- -- --  CL 113* -- -- 109 108 -- -- -- -- --  CO2 35* -- -- 37* 36* -- -- -- -- --  GLUCOSE 105* -- -- 132* 120* -- -- -- -- --  BUN 74* -- -- 88* 99* -- -- -- -- --  CREATININE 0.96 -- -- 1.08 1.15 -- -- -- -- --  CALCIUM 8.0* -- -- 8.6 8.7 -- -- -- -- --  MG -- -- -- -- 2.1 2.1 2.0 -- -- --  PHOS -- -- -- -- 4.3 4.1 4.5 -- -- --  AST -- -- -- -- -- -- -- -- -- 42*  ALT -- -- -- -- -- -- -- -- -- 56*  ALKPHOS -- -- -- -- -- -- -- -- -- 59  BILITOT -- -- -- -- -- -- -- -- -- 0.4  PROT -- -- -- -- -- -- -- -- -- 5.6*  ALBUMIN -- -- -- --  -- 1.5* -- -- -- 1.6*  APTT -- -- -- -- -- -- -- -- -- --  INR -- -- -- -- 1.14 -- -- -- -- --  LATICACIDVEN -- -- -- -- -- -- -- -- 1.4 --  TROPONINI -- -- -- -- -- -- -- -- -- --  PROCALCITON -- -- -- -- -- -- -- -- -- --  PROBNP -- -- -- -- -- -- -- -- -- --  O2SATVEN -- -- -- -- -- -- -- -- -- --  PHART -- 7.428 7.409 -- -- -- -- 7.367 -- --  PCO2ART -- 55.0* 55.8* -- -- -- -- 60.5* -- --  PO2ART -- 73.0* 61.7* -- -- -- -- 68.8* -- --    Lab 08/07/12 0736 08/07/12 0405 08/06/12 2315 08/06/12 2035 08/06/12 1647  GLUCAP 97 105* 118* 129* 111*    CXR:  Dg Chest Port 1 View  08/06/2012  *RADIOLOGY REPORT*  Clinical Data: Follow up of pneumonia and empyema.  Shortness of breath.  PORTABLE CHEST - 1 VIEW  Comparison: 1 day prior  Findings: Nasogastric tube extends beyond the  inferior aspect of the film.  Tracheostomy appropriately positioned.  Right-sided PICC line unchanged.  Mild cardiomegaly.  Layering bilateral pleural effusions.  The left- sided effusion is likely increased.  There may be minimal loculation laterally.  Left-sided pleural drain is in place.  No pneumothorax.  Lower lobe predominant interstitial and airspace disease is minimally progressive.  IMPRESSION: Slight worsening in aeration with increased interstitial and air space disease.  Likely a combination of pulmonary edema and infection.  Similar right and increased left-sided pleural effusion.  Question lateral loculation of the left pleural effusions; pleural catheter in place.   Original Report Authenticated By: Abigail Miyamoto, M.D.      ASSESSMENT / PLAN:  PULMONARY A: Acute respiratory failure with ARDS 2nd to H1N1/Influenza A and pneumococcal PNA. Hx of smoking with emphysema on CT chest. Failure to wean from ventilator s/p tracheostomy. He has not been able to wean from ventilator in spite of attempts since tracheostomy 1/30 due to high PEEP/FiO2 demands and mental status. Lt exudative pleural effusion likely from  empyema. P:   Adjust PEEP/FiO2 to keep SpO2 > 92% Goal negative fluid balance Will pull chest tube today BD's PRN Continue to wean sedating medications  CARDIOVASCULAR  A: Septic shock from PNA. >> resolved Demand ischemia with NSTEMI. 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 Hx of HTN, Hyperlipidemia. P:  D/c solucortef Continue ASA  RENAL A:  Acute renal failure. - improving Hypokalemia. P:   F/u renal fx, urine outpt F/u and replace electrolytes as needed  GASTROINTESTINAL A:  Nutrition. P:   Tube feeds while on vent  HEMATOLOGIC A:  Anemia of critical illness. Thrombocytopenia. P:  F/u CBC SQ heparin for DVT prophylaxis  INFECTIOUS A:  PNA/bacteremia from influenza and pneumococcus. Lt Empyema. Fever. P:   Abx per ID D/c all abx 2/05  ENDOCRINE A:  DM with hyperglycemia. Relative adrenal insufficiency. P:   SSI D/c solucortef  NEUROLOGIC A:  Acute encephalopathy. Myoclonus. - ? resolved P:   Neurology signed off >> may need to reconsult Continue dilantin for now wean ativan to 0.5mg  q8hr D/c risperdal  DERMATOLOGIC A: Drug rash 2nd to cephalosporins. P: Monitor   TODAY'S SUMMARY: Will put in referral for LTAC placement today.  BOOTH, [place], PGY-2 08/07/2012, 7:49 AM  Reviewed above, examined pt, and agree with assessment/plan.  Shows some improvement in mental status with decrease in sedation >> will continue to decrease as tolerated.  Continues to have difficulty with vent weaning >> will change to pressure control.  Abx to be d/c today per ID.  Minimal outpt from chest tube >> will d/c.  He is ready for transfer to [facility] when available.  Updated family at bedside.  Chesley Mires, MD [facility] Pulmonary/Critical Care 08/07/2012, 1:50 PM Pager:  [phone] After 3pm call: [phone]

## 2012-08-07 NOTE — Progress Notes (Signed)
Subjective: Eyes open.   Objective: Vital signs in last 24 hours: Temp:  [98 F (36.7 C)-100.4 F (38 C)] 98 F (36.7 C) (02/05 0745) Pulse Rate:  [104-116] 114  (02/05 0800) Resp:  [28-42] 32  (02/05 0748) BP: (106-130)/(62-94) 111/71 mmHg (02/05 0800) SpO2:  [92 %-98 %] 93 % (02/05 0800) FiO2 (%):  [40 %-50 %] 40 % (02/05 0748) Weight:  [240 lb 4.8 oz (109 kg)] 240 lb 4.8 oz (109 kg) (02/05 0500) Last BM Date: 08/05/12  Intake/Output from previous day: 02/04 0701 - 02/05 0700 In: 4160 [I.V.:590; NG/GT:3060; IV Piggyback:510] Out: 4650 [Urine:4125; Stool:525] Intake/Output this shift:    PE: left thahl chest tube intact without evidence of leak - remains on suction. Appx. 750 ml bloody serous fluid in pleur vac. Rhonchi bilaterally with decreased BS to left base.   Lab Results:   Basename 08/07/12 0419 08/06/12 0445  WBC 7.2 6.8  HGB 7.9* 7.6*  HCT 25.3* 24.7*  PLT 173 166   BMET  Basename 08/07/12 0419 08/06/12 0445  NA 150* 149*  K 3.4* 3.2*  CL 113* 109  CO2 35* 37*  GLUCOSE 105* 132*  BUN 74* 88*  CREATININE 0.96 1.08  CALCIUM 8.0* 8.6   PT/INR  Basename 08/05/12 0430  LABPROT 14.4  INR 1.14   ABG  Basename 08/06/12 1736 08/06/12 0532  PHART 7.428 7.409  HCO3 36.1* 34.6*    Studies/Results: Dg Chest Port 1 View  08/06/2012  *RADIOLOGY REPORT*  Clinical Data: Follow up of pneumonia and empyema.  Shortness of breath.  PORTABLE CHEST - 1 VIEW  Comparison: 1 day prior  Findings: Nasogastric tube extends beyond the  inferior aspect of the film.  Tracheostomy appropriately positioned.  Right-sided PICC line unchanged.  Mild cardiomegaly.  Layering bilateral pleural effusions.  The left- sided effusion is likely increased.  There may be minimal loculation laterally.  Left-sided pleural drain is in place.  No pneumothorax.  Lower lobe predominant interstitial and airspace disease is minimally progressive.  IMPRESSION: Slight worsening in aeration with  increased interstitial and air space disease.  Likely a combination of pulmonary edema and infection.  Similar right and increased left-sided pleural effusion.  Question lateral loculation of the left pleural effusions; pleural catheter in place.   Original Report Authenticated By: Abigail Miyamoto, M.D.     Anti-infectives: Anti-infectives     Start     Dose/Rate Route Frequency Ordered Stop   08/06/12 0000   vancomycin (VANCOCIN) 1,500 mg in sodium chloride 0.9 % 500 mL IVPB        1,500 mg 250 mL/hr over 120 Minutes Intravenous Every 24 hours 08/05/12 2335     08/01/12 1400   clindamycin (CLEOCIN) IVPB 300 mg  Status:  Discontinued        300 mg 100 mL/hr over 30 Minutes Intravenous 3 times per day 08/01/12 1109 08/06/12 1349   07/30/12 2300   vancomycin (VANCOCIN) 1,250 mg in sodium chloride 0.9 % 250 mL IVPB  Status:  Discontinued        1,250 mg 166.7 mL/hr over 90 Minutes Intravenous Every 24 hours 07/30/12 1109 08/05/12 2334   07/28/12 2300   vancomycin (VANCOCIN) IVPB 1000 mg/200 mL premix  Status:  Discontinued        1,000 mg 200 mL/hr over 60 Minutes Intravenous Every 24 hours 07/27/12 2142 07/30/12 1129   07/27/12 2215   vancomycin (VANCOCIN) 1,250 mg in sodium chloride 0.9 % 250 mL IVPB  1,250 mg 166.7 mL/hr over 90 Minutes Intravenous NOW 07/27/12 2142 07/27/12 2355   07/26/12 2200   levofloxacin (LEVAQUIN) IVPB 750 mg  Status:  Discontinued        750 mg 100 mL/hr over 90 Minutes Intravenous Every 48 hours 07/26/12 0940 07/29/12 1337   07/25/12 1500   cefTRIAXone (ROCEPHIN) 2 g in dextrose 5 % 50 mL IVPB  Status:  Discontinued        2 g 100 mL/hr over 30 Minutes Intravenous Every 24 hours 07/25/12 1401 07/26/12 0921   07/23/12 1300   vancomycin (VANCOCIN) 1,250 mg in sodium chloride 0.9 % 250 mL IVPB  Status:  Discontinued        1,250 mg 166.7 mL/hr over 90 Minutes Intravenous Every 24 hours 07/23/12 1158 07/25/12 1356   07/22/12 1000   oseltamivir (TAMIFLU) 6  MG/ML suspension 150 mg        150 mg Per Tube Daily 07/21/12 1103 07/30/12 1034   07/22/12 0530   vancomycin (VANCOCIN) 1,500 mg in sodium chloride 0.9 % 500 mL IVPB  Status:  Discontinued        1,500 mg 250 mL/hr over 120 Minutes Intravenous Every 48 hours 07/22/12 0525 07/23/12 1158   07/21/12 1000   oseltamivir (TAMIFLU) capsule 150 mg  Status:  Discontinued        150 mg Oral Daily 07/21/12 0936 07/21/12 1103   07/20/12 2300   levofloxacin (LEVAQUIN) IVPB 750 mg  Status:  Discontinued        750 mg 100 mL/hr over 90 Minutes Intravenous Every 48 hours 07/20/12 1847 07/25/12 1356   07/20/12 2200   oseltamivir (TAMIFLU) capsule 75 mg  Status:  Discontinued        75 mg Oral 2 times daily 07/20/12 1824 07/21/12 0936   07/20/12 2100   vancomycin (VANCOCIN) 2,000 mg in sodium chloride 0.9 % 500 mL IVPB        2,000 mg 250 mL/hr over 120 Minutes Intravenous  Once 07/20/12 1847 07/20/12 2300   07/20/12 2000   ceFEPIme (MAXIPIME) 1 g in dextrose 5 % 50 mL IVPB  Status:  Discontinued        1 g 100 mL/hr over 30 Minutes Intravenous Every 24 hours 07/20/12 1847 07/25/12 1356          Assessment/Plan: VDRF / empyema s/p chest drain placement in IR on 1/30. Continues to drain - no leak. Continue chest drain at this time. IR to continue to follow.     LOS: 18 days    Virgel Haro D 08/07/2012

## 2012-08-07 NOTE — Progress Notes (Signed)
    Patient's BS 63. 1 amp of d50 given. Recheck and BS increased to 113.vss, does not appear to have s/s of hypoglycemia. Will continue to monitor

## 2012-08-07 NOTE — Progress Notes (Signed)
[facility] Progress Note Patient Name: [name] DOB: [birth year] MRN: [id number]  Date of Service  08/07/2012   HPI/Events of Note   hypokalemia  eICU Interventions  Potassium replaced   Intervention Category Minor Interventions: Electrolytes abnormality - evaluation and management  DETERDING,ELIZABETH 08/07/2012, 5:13 AM

## 2012-08-08 DIAGNOSIS — J9600 Acute respiratory failure, unspecified whether with hypoxia or hypercapnia: Secondary | ICD-10-CM

## 2012-08-08 DIAGNOSIS — R0902 Hypoxemia: Secondary | ICD-10-CM

## 2012-08-08 LAB — GLUCOSE, CAPILLARY
Glucose-Capillary: 100 mg/dL — ABNORMAL HIGH (ref 70–99)
Glucose-Capillary: 93 mg/dL (ref 70–99)

## 2012-08-08 LAB — CBC
Hemoglobin: 8.3 g/dL — ABNORMAL LOW (ref 13.0–17.0)
MCH: 30.3 pg (ref 26.0–34.0)
MCHC: 31.8 g/dL (ref 30.0–36.0)
MCV: 95.3 fL (ref 78.0–100.0)
RBC: 2.74 MIL/uL — ABNORMAL LOW (ref 4.22–5.81)

## 2012-08-08 LAB — BASIC METABOLIC PANEL
Chloride: 112 mEq/L (ref 96–112)
GFR calc non Af Amer: 81 mL/min — ABNORMAL LOW (ref >90)
Sodium: 150 mEq/L — ABNORMAL HIGH (ref 135–145)

## 2012-08-08 MED ORDER — LORAZEPAM 2 MG/ML IJ SOLN
0.5000 mg | Freq: Two times a day (BID) | INTRAMUSCULAR | Status: DC
  Administered 2012-08-08: 0.5 mg via INTRAVENOUS
  Filled 2012-08-08: qty 1

## 2012-08-08 MED ORDER — DEXTROSE 5 % IV SOLN
INTRAVENOUS | Status: DC
  Administered 2012-08-08 – 2012-08-09 (×2): via INTRAVENOUS

## 2012-08-08 MED ORDER — INSULIN GLARGINE 100 UNIT/ML ~~LOC~~ SOLN
25.0000 [IU] | SUBCUTANEOUS | Status: DC
  Administered 2012-08-08 – 2012-08-09 (×2): 25 [IU] via SUBCUTANEOUS

## 2012-08-08 MED ORDER — FUROSEMIDE 10 MG/ML IJ SOLN
40.0000 mg | Freq: Once | INTRAMUSCULAR | Status: AC
  Administered 2012-08-08: 40 mg via INTRAVENOUS
  Filled 2012-08-08: qty 4

## 2012-08-08 NOTE — Progress Notes (Signed)
PULMONARY  / CRITICAL CARE MEDICINE  Name: [name] MRN: [id number] DOB: 27-Feb-1941    ADMISSION DATE:  07/20/2012 CONSULTATION DATE:  1/18/20014   REFERRING MD :  [name]  CHIEF COMPLAINT:  Dyspnea  BRIEF PATIENT DESCRIPTION:  53 male smoker transferred from [facility] 07/20/12 with Sepsis, ARDS with PNA from H1N1/Influenza A and pneumococcus.  Significant PMHx of HTN, OSA, Hyperlipidemia, pre-DM  SIGNIFICANT EVENTS: 1/18 Transfer to [facility] from [place] for respiratory failure started on Nimbex protocol for SEVERE ARDS.  1/27 ct chest- free flowing rt effusion small, partial loculated effusion left small  1/29 diagnostic left thora, exudative  1/30 fevers continued, chest tube placement with bleeding. 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 1/27 CT head >> mild atrophy 1/27 CT chest >> atherosclerosis, centrilobular/paraseptal emphysema with subpleural blebs and few bulla, patchy ASD b/l Rt > Lt, LLL consolidation, partially loculated Lt pleural effusion 1/31 CT head >> no acute findings 2/01 EEG >> severe encephalopathy, no epileptiform activity  LINES / TUBES: ETT 1/18 >> 1/31 Trach (DF ) 1/30 >>  Lt chest tube 1/30 >> 2/5 Rt PICC 2/02 >>  CULTURES: 1/18 Blood [name]) >> pneumococcus 1/20 Respiratory viral panel >> Influenza A positive 1/26 Blood >> negative 2/02 Cath tip >> negative  ANTIBIOTICS: 1/18 Vanc >> 1/23 1/18 Levaquin >> 1/23  1/18 Tamiflu >> 1/28  1/18 Cefepime >>1/23  1/23 Rocephin 1/23>>> 1/23  1/24 Levofloxacin >>> 1/27 1/25 Vancomycin >> 2/5 1/30 Clindamycin >> 2/4  SUBJECTIVE:  Remains on high PEEP/FiO2.  Sedated.    VITAL SIGNS: Temp:  [98.3 F (36.8 C)-101.5 F (38.6 C)] 101.4 F (38.6 C) (02/06 0340) Pulse Rate:  [109-122] 116  (02/06 0600) Resp:  [18-27] 27  (02/06 0600) BP: (103-163)/(65-107) 124/107 mmHg (02/06  0600) SpO2:  [89 %-100 %] 89 % (02/06 0600) FiO2 (%):  [40 %] 40 % (02/06 0313) Weight:  [242 lb 4.6 oz (109.9 kg)] 242 lb 4.6 oz (109.9 kg) (02/06 0455) HEMODYNAMICS: CVP:  [7 mmHg-19 mmHg] 15 mmHg VENTILATOR SETTINGS: Vent Mode:  [-] PCV FiO2 (%):  [40 %] 40 % Set Rate:  [18 bmp] 18 bmp PEEP:  [8 cmH20] 8 cmH20 Plateau Pressure:  [21 cmH20-27 cmH20] 21 cmH20 INTAKE / OUTPUT: Intake/Output      02/05 0701 - 02/06 0700 02/06 0701 - 02/07 0700   I.V. (mL/kg) 480 (4.4) 10 (0.1)   NG/GT 2620    IV Piggyback     Total Intake(mL/kg) 3100 (28.2) 10 (0.1)   Urine (mL/kg/hr) 1845 (0.7)    Stool     Total Output 1845    Net +1255 +10          PHYSICAL EXAMINATION: General:  Ill appearing Neuro:  Minimally responsive HEENT:  Trach site clean Cardiovascular:  s1s2 regular, tachycardic Lungs: coarse breath sounds Abdomen:  Soft, non tender Musculoskeletal:  2+ edema all extremities Skin:  No rashes  LABS:  Lab 08/08/12 0500 08/07/12 1100 08/07/12 0419 08/06/12 1736 08/06/12 0532 08/06/12 0445 08/05/12 0430 08/04/12 0415 08/03/12 0349 08/01/12 1900  HGB 8.3* -- 7.9* -- -- 7.6* -- -- -- --  WBC 9.7 -- 7.2 -- -- 6.8 -- -- -- --  PLT 190 -- 173 -- -- 166 -- -- -- --  NA 150* -- 150* -- -- 149* -- -- -- --  K 3.9 -- 3.4* -- -- -- -- -- -- --  CL 112 -- 113* -- -- 109 -- -- -- --  CO2 33* -- 35* -- -- 37* -- -- -- --  GLUCOSE 108* -- 105* -- -- 132* -- -- -- --  BUN 62* -- 74* -- -- 88* -- -- -- --  CREATININE 0.99 -- 0.96 -- -- 1.08 -- -- -- --  CALCIUM 8.2* -- 8.0* -- -- 8.6 -- -- -- --  MG -- -- -- -- -- -- 2.1 2.1 2.0 --  PHOS -- -- -- -- -- -- 4.3 4.1 4.5 --  AST -- -- -- -- -- -- -- -- -- --  ALT -- -- -- -- -- -- -- -- -- --  ALKPHOS -- -- -- -- -- -- -- -- -- --  BILITOT -- -- -- -- -- -- -- -- -- --  PROT -- -- -- -- -- -- -- -- -- --  ALBUMIN -- -- -- -- -- -- -- 1.5* -- --  APTT -- -- -- -- -- -- -- -- -- --  INR -- -- -- -- -- -- 1.14 -- -- --  LATICACIDVEN -- --  -- -- -- -- -- -- -- 1.4  TROPONINI -- -- -- -- -- -- -- -- -- --  PROCALCITON -- -- -- -- -- -- -- -- -- --  PROBNP -- -- -- -- -- -- -- -- -- --  O2SATVEN -- -- -- -- -- -- -- -- -- --  PHART -- 7.497* -- 7.428 7.409 -- -- -- -- --  PCO2ART -- 43.6 -- 55.0* 55.8* -- -- -- -- --  PO2ART -- 49.7* -- 73.0* 61.7* -- -- -- -- --    Lab 08/08/12 0719 08/08/12 0339 08/08/12 0006 08/07/12 2053 08/07/12 2016  GLUCAP 112* 100* 93 113* 63*    CXR:  Dg Chest Port 1 View  08/07/2012  *RADIOLOGY REPORT*  Clinical Data: Status post left chest tube removal.  PORTABLE CHEST - 1 VIEW  Comparison: 08/06/2012  Findings: 1602 hours.  The patient is rotated substantially to the right.  Tracheostomy tube remains in place. The NG tube passes into the stomach although the distal tip position is not included on the film.  Right PICC line tip obscured by overlying cardiac leads. The cardiopericardial silhouette is enlarged.  Diffuse interstitial and alveolar disease persists without substantial change.  Probable bilateral pleural effusions, left greater than right.  IMPRESSION: Rotated film with persistent interstitial and bilateral relatively symmetric airspace disease.  Imaging features may be related to pulmonary edema.  Bilateral pleural effusions, left greater than right.   Original Report Authenticated By: Misty Stanley, M.D.      ASSESSMENT / PLAN:  PULMONARY A: Acute respiratory failure with ARDS 2nd to H1N1/Influenza A and pneumococcal PNA. Hx of smoking with emphysema on CT chest. Failure to wean from ventilator s/p tracheostomy. He has not been able to wean from ventilator in spite of attempts since tracheostomy 1/30 due to high PEEP/FiO2 demands and mental status. Lt exudative pleural effusion likely from empyema. P:   Adjust PEEP/FiO2 to keep SpO2 > 92% Goal negative fluid balance BD's PRN Continue to wean sedating medications Will give addition lasix IV 40mg  x1 for diuresis  CARDIOVASCULAR A:  Septic shock from PNA. >> resolved Demand ischemia with NSTEMI. 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 Hx of HTN, Hyperlipidemia. P:  Continue ASA  RENAL A:  Acute renal failure. - improving Hypokalemia. Hypernatremia. P:   F/u renal fx, urine outpt  F/u and replace electrolytes as needed D5W at 50cc/hr for today  GASTROINTESTINAL A:  Nutrition. P:   Tube feeds while on vent  HEMATOLOGIC A:  Anemia of critical illness. Thrombocytopenia. P:  F/u CBC SQ heparin for DVT prophylaxis  INFECTIOUS A:  PNA/bacteremia from influenza and pneumococcus. Lt Empyema. Fever. P:   Abx per ID - off all abx on 2/5 Monitor  ENDOCRINE A:  DM with hyperglycemia. Relative adrenal insufficiency.>> resolved P:   SSI  NEUROLOGIC A:  Acute encephalopathy. Myoclonus. - ? resolved P:   Neurology signed off >> may need to reconsult once off sedating meds for continuation of dilatin Continue dilantin for now wean ativan to 0.5mg  q12hr  DERMATOLOGIC A: Drug rash 2nd to cephalosporins. P: Monitor   Will continue to work toward LTAC placement when medical ready.  BOOTH, [place], PGY-2 08/08/2012, 8:08 AM  Low grade fever off Abx, but otherwise improving.  Not making quick progress with vent weaning.  He will need LTAC placement to assist with vent weaning and rehab.  He will be ready for LTAC transfer when bed available.  Continue to decrease sedation as tolerated >> no further evidence for myoclonus.  May need neuro input at some point again to determine if he needs to be on dilantin long term.   Updated wife at bedside.  Chesley Mires, MD [facility] Pulmonary/Critical Care 08/08/2012, 1:11 PM Pager:  [phone] After 3pm call: [phone]

## 2012-08-08 NOTE — Progress Notes (Signed)
Patient ID: [name], male   DOB: June 29, 1941, 72 y.o.   MRN: [id number]    [facility]    Date of Admission:  07/20/2012   off antibiotics for 24 hours  Active Problems:  Acute respiratory failure with hypoxia  ARDS (adult respiratory distress syndrome)  Acute renal failure  Other convulsions  Abnormal involuntary movement      . antiseptic oral rinse  15 mL Mouth Rinse QID  . aspirin  81 mg Per Tube Daily  . chlorhexidine  15 mL Mouth Rinse BID  . feeding supplement (OXEPA)  1,000 mL Per Tube Q24H  . feeding supplement  60 mL Per Tube TID  . free water  500 mL Per Tube Q4H  . insulin aspart  0-20 Units Subcutaneous Q4H  . insulin glargine  30 Units Subcutaneous Q24H  . LORazepam  0.5 mg Intravenous Q8H  . magnesium sulfate 1 - 4 g bolus IVPB  2 g Intravenous Once  . multivitamin  5 mL Per Tube Daily  . pantoprazole sodium  40 mg Per Tube Daily  . phenytoin  125 mg Per Tube TID  . sodium chloride  10-40 mL Intracatheter Q12H    Objective: Temp:  [98.3 F (36.8 C)-101.5 F (38.6 C)] 101.2 F (38.4 C) (02/06 0836) Pulse Rate:  [105-122] 108  (02/06 1100) Resp:  [18-28] 27  (02/06 1100) BP: (96-163)/(65-107) 119/80 mmHg (02/06 1100) SpO2:  [89 %-100 %] 97 % (02/06 1100) FiO2 (%):  [40 %] 40 % (02/06 1100) Weight:  [109.9 kg (242 lb 4.6 oz)] 109.9 kg (242 lb 4.6 oz) (02/06 0455) weight is up 5 kg over the past week   General: Eyes open but not following commands currently. His nurse reports that he did grip on command last night. Skin: No rash. Some weeping, scabbed areas on his forearms left greater than right. Right arm PICC site appears normal Lungs: Few crackles bilaterally. Not requiring much suctioning Cor: Distant heart sounds Abdomen: Distended but soft with active bowel sounds. Soft stool noted in rectal tube Extremities: Edema persists  Lab Results Lab Results  Component Value Date   WBC 9.7 08/08/2012   HGB 8.3* 08/08/2012   HCT 26.1* 08/08/2012   MCV 95.3 08/08/2012   PLT 190 08/08/2012    Microbiology: Recent Results (from the past 240 hour(s))  BODY FLUID CULTURE     Status: Normal   Collection Time   07/31/12 11:47 AM      Component Value Range Status Comment   Specimen Description PLEURAL FLUID LEFT   Final    Special Requests 3.0 ML FLUID   Final    Gram Stain     Final    Value: MODERATE WBC PRESENT,BOTH PMN AND MONONUCLEAR     NO ORGANISMS SEEN   Culture NO GROWTH 3 DAYS   Final    Report Status 08/03/2012 FINAL   Final   CULTURE, RESPIRATORY     Status: Normal   Collection Time   08/01/12  3:20 PM      Component Value Range Status Comment   Specimen Description TRACHEAL ASPIRATE   Final    Special Requests NONE   Final    Gram Stain     Final    Value: FEW WBC PRESENT,BOTH PMN AND MONONUCLEAR     NO ORGANISMS SEEN   Culture NO GROWTH 2 DAYS   Final    Report Status 08/03/2012 FINAL   Final   BODY  FLUID CULTURE     Status: Normal   Collection Time   08/01/12  6:31 PM      Component Value Range Status Comment   Specimen Description PLEURAL FLUID LEFT   Final    Special Requests NONE   Final    Gram Stain     Final    Value: FEW WBC PRESENT,BOTH PMN AND MONONUCLEAR     NO ORGANISMS SEEN   Culture NO GROWTH 3 DAYS   Final    Report Status 08/05/2012 FINAL   Final   CLOSTRIDIUM DIFFICILE BY PCR     Status: Normal   Collection Time   08/01/12  7:45 PM      Component Value Range Status Comment   C difficile by pcr NEGATIVE  NEGATIVE Final   CATH TIP CULTURE     Status: Normal   Collection Time   08/04/12  4:28 PM      Component Value Range Status Comment   Specimen Description CATH TIP CENTRAL LINE   Final    Special Requests NONE   Final    Culture NO GROWTH 2 DAYS   Final    Report Status 08/07/2012 FINAL   Final     Studies/Results: Dg Chest Port 1 View  08/07/2012  *RADIOLOGY REPORT*  Clinical Data: Status post left chest tube removal.  PORTABLE CHEST - 1 VIEW  Comparison: 08/06/2012   Findings: 1602 hours.  The patient is rotated substantially to the right.  Tracheostomy tube remains in place. The NG tube passes into the stomach although the distal tip position is not included on the film.  Right PICC line tip obscured by overlying cardiac leads. The cardiopericardial silhouette is enlarged.  Diffuse interstitial and alveolar disease persists without substantial change.  Probable bilateral pleural effusions, left greater than right.  IMPRESSION: Rotated film with persistent interstitial and bilateral relatively symmetric airspace disease.  Imaging features may be related to pulmonary edema.  Bilateral pleural effusions, left greater than right.   Original Report Authenticated By: Misty Stanley, M.D.     Assessment: Low-grade fevers persist but overall he seems to be a little bit better in the past 48 hours. I favor continued observation off of antibiotics for now. If his fevers persist I would repeat blood, sputum and urine cultures as well as repeat C. difficile PCR. If fevers persist then he becomes unstable in other ways I would consider restarting empiric IV vancomycin and ciprofloxacin after cultures are obtained.  Plan: 1. Careful observation off of antibiotics for now  Michel Bickers, MD [facility] for [place] [phone] pager   [phone] cell 08/08/2012, 11:40 AM

## 2012-08-09 ENCOUNTER — Inpatient Hospital Stay (HOSPITAL_COMMUNITY): Payer: BC Managed Care – PPO

## 2012-08-09 LAB — URINALYSIS, ROUTINE W REFLEX MICROSCOPIC
Glucose, UA: NEGATIVE mg/dL
Ketones, ur: NEGATIVE mg/dL
Nitrite: NEGATIVE
Protein, ur: NEGATIVE mg/dL

## 2012-08-09 LAB — CBC
HCT: 25.9 % — ABNORMAL LOW (ref 39.0–52.0)
Hemoglobin: 8.1 g/dL — ABNORMAL LOW (ref 13.0–17.0)
MCH: 29.8 pg (ref 26.0–34.0)
MCHC: 31.3 g/dL (ref 30.0–36.0)
RBC: 2.72 MIL/uL — ABNORMAL LOW (ref 4.22–5.81)

## 2012-08-09 LAB — BASIC METABOLIC PANEL
BUN: 60 mg/dL — ABNORMAL HIGH (ref 6–23)
Chloride: 109 mEq/L (ref 96–112)
Glucose, Bld: 117 mg/dL — ABNORMAL HIGH (ref 70–99)
Potassium: 3.3 mEq/L — ABNORMAL LOW (ref 3.5–5.1)
Sodium: 148 mEq/L — ABNORMAL HIGH (ref 135–145)

## 2012-08-09 LAB — URINE MICROSCOPIC-ADD ON

## 2012-08-09 LAB — GLUCOSE, CAPILLARY
Glucose-Capillary: 107 mg/dL — ABNORMAL HIGH (ref 70–99)
Glucose-Capillary: 113 mg/dL — ABNORMAL HIGH (ref 70–99)
Glucose-Capillary: 149 mg/dL — ABNORMAL HIGH (ref 70–99)

## 2012-08-09 MED ORDER — FUROSEMIDE 10 MG/ML IJ SOLN
INTRAMUSCULAR | Status: AC
  Filled 2012-08-09: qty 4

## 2012-08-09 MED ORDER — FUROSEMIDE 10 MG/ML IJ SOLN
40.0000 mg | Freq: Four times a day (QID) | INTRAMUSCULAR | Status: AC
  Administered 2012-08-09 (×2): 40 mg via INTRAVENOUS

## 2012-08-09 MED ORDER — POTASSIUM CHLORIDE 10 MEQ/50ML IV SOLN
10.0000 meq | INTRAVENOUS | Status: AC
  Administered 2012-08-09 (×2): 10 meq via INTRAVENOUS
  Filled 2012-08-09: qty 50
  Filled 2012-08-09: qty 100

## 2012-08-09 MED ORDER — FUROSEMIDE 10 MG/ML IJ SOLN
80.0000 mg | Freq: Once | INTRAMUSCULAR | Status: AC
  Administered 2012-08-09: 80 mg via INTRAVENOUS
  Filled 2012-08-09: qty 8

## 2012-08-09 MED ORDER — FAMOTIDINE 40 MG/5ML PO SUSR
20.0000 mg | Freq: Two times a day (BID) | ORAL | Status: DC
  Administered 2012-08-09 (×2): 20 mg
  Filled 2012-08-09 (×6): qty 2.5

## 2012-08-09 NOTE — Progress Notes (Signed)
Fever; cultures ordered   Hypokalemia; K repalced

## 2012-08-09 NOTE — Progress Notes (Signed)
Patient ID: [name], male   DOB: December 28, 1940, 72 y.o.   MRN: [id number]    [place]    Date of Admission:  07/20/2012   off antibiotics x 48 hours  Objective: Temp:  [97.9 F (36.6 C)-102.3 F (39.1 C)] 101.9 F (38.8 C) (02/07 0843) Pulse Rate:  [100-112] 106  (02/07 1140) Resp:  [15-32] 24  (02/07 1140) BP: (100-144)/(60-112) 144/112 mmHg (02/07 1140) SpO2:  [85 %-100 %] 94 % (02/07 1140) FiO2 (%):  [40 %] 40 % (02/07 0800)  General: He is more alert and opens eyes to voice Skin: Some erythema and skin tears of his left arm but does not have the appearance of cellulitis Lungs: Scattered crackles Cor: Distant heart sounds Abdomen: Distended, soft and not obviously tender with positive bowel sounds. Diarrhea unchanged  Lab Results Lab Results  Component Value Date   WBC 11.6* 08/09/2012   HGB 8.1* 08/09/2012   HCT 25.9* 08/09/2012   MCV 95.2 08/09/2012   PLT 195 08/09/2012     Studies/Results: Dg Chest Port 1 View  08/09/2012  *RADIOLOGY REPORT*  Clinical Data: Pleural effusion, ARDS  PORTABLE CHEST - 1 VIEW  Comparison: Portable exam 0603 hours compared to 08/07/2012  Findings: Tracheostomy tube and nasogastric tube unchanged. Right arm PICC line tip projecting over SVC. Numerous cardiac monitoring leads project over chest. Enlargement of cardiac silhouette with pulmonary vascular congestion. Diffuse pulmonary infiltrates greatest at bases, little changed. Associated bibasilar effusions. No gross pneumothorax.  IMPRESSION: Little change in diffuse bilateral pulmonary infiltrates, greatest at bases. Bibasilar effusions.   Original Report Authenticated By: Lavonia Dana, M.D.    Dg Chest Port 1 View  08/07/2012  *RADIOLOGY REPORT*  Clinical Data: Status post left chest tube removal.  PORTABLE CHEST - 1 VIEW  Comparison: 08/06/2012  Findings: 1602 hours.  The patient is rotated substantially to the right.  Tracheostomy tube remains in place. The NG tube passes  into the stomach although the distal tip position is not included on the film.  Right PICC line tip obscured by overlying cardiac leads. The cardiopericardial silhouette is enlarged.  Diffuse interstitial and alveolar disease persists without substantial change.  Probable bilateral pleural effusions, left greater than right.  IMPRESSION: Rotated film with persistent interstitial and bilateral relatively symmetric airspace disease.  Imaging features may be related to pulmonary edema.  Bilateral pleural effusions, left greater than right.   Original Report Authenticated By: Misty Stanley, M.D.     Assessment: He has persistent ICU fever but otherwise is looking better. I had discussed his case with Dr. Halford Chessman and we agree with continued observation off of antibiotics pending repeat cultures and C. difficile PCR.  Plan: 1. Continue observation off of antibiotics 2. Repeat urine, sputum and blood cultures 3. Repeat C. difficile PCR  Michel Bickers, MD [facility] for Infectious [facility] [phone] pager   [phone] cell 08/09/2012, 11:48 AM

## 2012-08-09 NOTE — Progress Notes (Signed)
eLink Physician-Brief Progress Note Patient Name: [name] DOB: 12-15-1940 MRN: [id number]  Date of Service  08/09/2012   HPI/Events of Note   oliguria  eICU Interventions  Lasix 80mg  IVP x one dose  chk CVP   Intervention Category Intermediate Interventions: Oliguria - evaluation and management  Asencion Noble 08/09/2012, 7:11 PM

## 2012-08-09 NOTE — Progress Notes (Signed)
PULMONARY  / CRITICAL CARE MEDICINE  Name: [name] MRN: [id number] DOB: 1940/08/27    ADMISSION DATE:  07/20/2012 CONSULTATION DATE:  1/18/20014   REFERRING MD :  [name] ER  CHIEF COMPLAINT:  Dyspnea  BRIEF PATIENT DESCRIPTION:  58 male smoker transferred from [facility] 07/20/12 with Sepsis, ARDS with PNA from H1N1/Influenza A and pneumococcus.  Significant PMHx of HTN, OSA, Hyperlipidemia, pre-DM  SIGNIFICANT EVENTS: 1/18 Transfer to [facility] from [place] for respiratory failure started on Nimbex protocol for SEVERE ARDS.  1/27 ct chest- free flowing rt effusion small, partial loculated effusion left small  1/29 diagnostic left thora, exudative  1/30 fevers continued, chest tube placement with bleeding. 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 1/27 CT head >> mild atrophy 1/27 CT chest >> atherosclerosis, centrilobular/paraseptal emphysema with subpleural blebs and few bulla, patchy ASD b/l Rt > Lt, LLL consolidation, partially loculated Lt pleural effusion 1/31 CT head >> no acute findings 2/01 EEG >> severe encephalopathy, no epileptiform activity  LINES / TUBES: ETT 1/18 >> 1/31 Trach (DF ) 1/30 >>  Lt chest tube 1/30 >> 2/5 Rt PICC 2/02 >>  CULTURES: 1/18 Blood [name]) >> pneumococcus 1/20 Respiratory viral panel >> Influenza A positive 1/26 Blood >> negative 2/02 Cath tip >> negative  ANTIBIOTICS: 1/18 Vanc >> 1/23 1/18 Levaquin >> 1/23  1/18 Tamiflu >> 1/28  1/18 Cefepime >>1/23  1/23 Rocephin 1/23>>> 1/23  1/24 Levofloxacin >>> 1/27 1/25 Vancomycin >> 2/5 1/30 Clindamycin >> 2/4  SUBJECTIVE:  Remains on high PEEP/FiO2.  Sedated.  Minimally responsive to voice and touch.  VITAL SIGNS: Temp:  [97.9 F (36.6 C)-102.3 F (39.1 C)] 102.3 F (39.1 C) (02/07 0406) Pulse Rate:  [100-114] 105  (02/07 0400) Resp:  [15-32] 20  (02/07 0400) BP:  (96-128)/(60-93) 113/67 mmHg (02/07 0400) SpO2:  [90 %-100 %] 93 % (02/07 0400) FiO2 (%):  [40 %] 40 % (02/07 0304) HEMODYNAMICS: CVP:  [9 mmHg-21 mmHg] 12 mmHg VENTILATOR SETTINGS: Vent Mode:  [-] PCV FiO2 (%):  [40 %] 40 % Set Rate:  [18 bmp] 18 bmp PEEP:  [8 cmH20] 8 cmH20 Plateau Pressure:  [21 cmH20-26 cmH20] 24 cmH20 INTAKE / OUTPUT: Intake/Output      02/06 0701 - 02/07 0700 02/07 0701 - 02/08 0700   I.V. (mL/kg) 930 (8.5)    NG/GT 2630    IV Piggyback 4    Total Intake(mL/kg) 3564 (32.4)    Urine (mL/kg/hr) 2290 (0.9)    Total Output 2290    Net +1274           PHYSICAL EXAMINATION: General:  Ill appearing Neuro:  Minimally responsive HEENT:  Trach site clean Cardiovascular:  s1s2 regular, tachycardic Lungs: coarse breath sounds Abdomen:  Soft, non tender, distended Musculoskeletal:  3+ edema all extremities, scrotal edema Skin:  No rashes  LABS:  Lab 08/09/12 0400 08/08/12 0500 08/07/12 1100 08/07/12 0419 08/06/12 1736 08/06/12 0532 08/05/12 0430 08/04/12 0415 08/03/12 0349  HGB 8.1* 8.3* -- 7.9* -- -- -- -- --  WBC 11.6* 9.7 -- 7.2 -- -- -- -- --  PLT 195 190 -- 173 -- -- -- -- --  NA 148* 150* -- 150* -- -- -- -- --  K 3.3* 3.9 -- -- -- -- -- -- --  CL 109 112 -- 113* -- -- -- -- --  CO2 33* 33* --  35* -- -- -- -- --  GLUCOSE 117* 108* -- 105* -- -- -- -- --  BUN 60* 62* -- 74* -- -- -- -- --  CREATININE 1.01 0.99 -- 0.96 -- -- -- -- --  CALCIUM 8.1* 8.2* -- 8.0* -- -- -- -- --  MG -- -- -- -- -- -- 2.1 2.1 2.0  PHOS -- -- -- -- -- -- 4.3 4.1 4.5  AST -- -- -- -- -- -- -- -- --  ALT -- -- -- -- -- -- -- -- --  ALKPHOS -- -- -- -- -- -- -- -- --  BILITOT -- -- -- -- -- -- -- -- --  PROT -- -- -- -- -- -- -- -- --  ALBUMIN -- -- -- -- -- -- -- 1.5* --  APTT -- -- -- -- -- -- -- -- --  INR -- -- -- -- -- -- 1.14 -- --  LATICACIDVEN -- -- -- -- -- -- -- -- --  TROPONINI -- -- -- -- -- -- -- -- --  PROCALCITON -- -- -- -- -- -- -- -- --  PROBNP -- --  -- -- -- -- -- -- --  O2SATVEN -- -- -- -- -- -- -- -- --  PHART -- -- 7.497* -- 7.428 7.409 -- -- --  PCO2ART -- -- 43.6 -- 55.0* 55.8* -- -- --  PO2ART -- -- 49.7* -- 73.0* 61.7* -- -- --    Lab 08/09/12 0312 08/08/12 2335 08/08/12 2037 08/08/12 1622 08/08/12 1231  GLUCAP 107* 124* 140* 145* 123*    CXR:  Dg Chest Port 1 View  08/09/2012  *RADIOLOGY REPORT*  Clinical Data: Pleural effusion, ARDS  PORTABLE CHEST - 1 VIEW  Comparison: Portable exam 0603 hours compared to 08/07/2012  Findings: Tracheostomy tube and nasogastric tube unchanged. Right arm PICC line tip projecting over SVC. Numerous cardiac monitoring leads project over chest. Enlargement of cardiac silhouette with pulmonary vascular congestion. Diffuse pulmonary infiltrates greatest at bases, little changed. Associated bibasilar effusions. No gross pneumothorax.  IMPRESSION: Little change in diffuse bilateral pulmonary infiltrates, greatest at bases. Bibasilar effusions.   Original Report Authenticated By: Lavonia Dana, M.D.    Dg Chest Port 1 View  08/07/2012  *RADIOLOGY REPORT*  Clinical Data: Status post left chest tube removal.  PORTABLE CHEST - 1 VIEW  Comparison: 08/06/2012  Findings: 1602 hours.  The patient is rotated substantially to the right.  Tracheostomy tube remains in place. The NG tube passes into the stomach although the distal tip position is not included on the film.  Right PICC line tip obscured by overlying cardiac leads. The cardiopericardial silhouette is enlarged.  Diffuse interstitial and alveolar disease persists without substantial change.  Probable bilateral pleural effusions, left greater than right.  IMPRESSION: Rotated film with persistent interstitial and bilateral relatively symmetric airspace disease.  Imaging features may be related to pulmonary edema.  Bilateral pleural effusions, left greater than right.   Original Report Authenticated By: Misty Stanley, M.D.      ASSESSMENT / PLAN:  PULMONARY A:  Acute respiratory failure with ARDS 2nd to H1N1/Influenza A and pneumococcal PNA. Hx of smoking with emphysema on CT chest. Failure to wean from ventilator s/p tracheostomy. He has not been able to wean from ventilator in spite of attempts since tracheostomy 1/30 due to high PEEP/FiO2 demands and mental status. Lt exudative pleural effusion likely from empyema. P:   Adjust PEEP/FiO2 to keep SpO2 > 92% Goal negative fluid balance BD's  PRN D/c ativan today Continue diuresis with lasix 40mg  IV x2 today  CARDIOVASCULAR A: Septic shock from PNA. >> resolved Demand ischemia with NSTEMI. 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 Hx of HTN, Hyperlipidemia. P:  Continue ASA  RENAL A:  Acute renal failure. - improving Hypokalemia. Hypernatremia. P:   F/u renal fx, urine outpt F/u and replace electrolytes as needed Continue D5W at 50cc/hr today  GASTROINTESTINAL A:  Nutrition. P:   Tube feeds while on vent Change PPx to pepcid  HEMATOLOGIC A:  Anemia of critical illness. Thrombocytopenia. P:  F/u CBC SQ heparin for DVT prophylaxis  INFECTIOUS A:  PNA/bacteremia from influenza and pneumococcus. Lt Empyema. Fever. P:   Abx per ID - off all abx on 2/5 >> continued fevers F/u repeat cultures, c. diff Antibiotics only if otherwise unstable  ENDOCRINE A:  DM with hyperglycemia. Relative adrenal insufficiency.>> resolved P:   SSI  NEUROLOGIC A:  Acute encephalopathy. Myoclonus. - ? resolved P:   Neurology signed off >> may need to reconsult once off sedating meds for continuation of dilatin Continue dilantin for now D/c ativan  DERMATOLOGIC A: Drug rash 2nd to cephalosporins. P: Monitor   Will continue to work toward LTAC placement when medical ready.  BOOTH, [place], PGY-2 08/09/2012, 7:44 AM  Reviewed above, examined pt, and agree with assessment/plan.  Has recurrent fever, but otherwise improving >> f/u cx and monitor off Abx for now.  Goal  negative fluid balance.  Being assessed for LTAC.  Will d/c ativan and monitor mental status.  Chesley Mires, MD [facility] Pulmonary/Critical Care 08/09/2012, 12:07 PM Pager:  [phone] After 3pm call: [phone]

## 2012-08-10 ENCOUNTER — Inpatient Hospital Stay (HOSPITAL_COMMUNITY): Payer: BC Managed Care – PPO

## 2012-08-10 DIAGNOSIS — R509 Fever, unspecified: Secondary | ICD-10-CM

## 2012-08-10 LAB — BASIC METABOLIC PANEL
BUN: 74 mg/dL — ABNORMAL HIGH (ref 6–23)
CO2: 28 mEq/L (ref 19–32)
Chloride: 101 mEq/L (ref 96–112)
Creatinine, Ser: 1.52 mg/dL — ABNORMAL HIGH (ref 0.50–1.35)
GFR calc Af Amer: 39 mL/min — ABNORMAL LOW (ref >90)
GFR calc non Af Amer: 33 mL/min — ABNORMAL LOW (ref >90)
Potassium: 3.4 mEq/L — ABNORMAL LOW (ref 3.5–5.1)
Sodium: 140 mEq/L (ref 135–145)

## 2012-08-10 LAB — URINE CULTURE: Colony Count: >=100000

## 2012-08-10 LAB — CBC
HCT: 24.6 % — ABNORMAL LOW (ref 39.0–52.0)
MCHC: 32.1 g/dL (ref 30.0–36.0)
MCV: 93.9 fL (ref 78.0–100.0)
RDW: 18.5 % — ABNORMAL HIGH (ref 11.5–15.5)

## 2012-08-10 LAB — GLUCOSE, CAPILLARY

## 2012-08-10 MED ORDER — SODIUM CHLORIDE 0.9 % IV SOLN
1250.0000 mg | INTRAVENOUS | Status: DC
  Administered 2012-08-10 – 2012-08-11 (×2): 1250 mg via INTRAVENOUS
  Filled 2012-08-10 (×3): qty 1250

## 2012-08-10 MED ORDER — POTASSIUM CHLORIDE 10 MEQ/50ML IV SOLN
10.0000 meq | INTRAVENOUS | Status: AC
  Administered 2012-08-10 (×3): 10 meq via INTRAVENOUS
  Filled 2012-08-10: qty 100
  Filled 2012-08-10: qty 50

## 2012-08-10 MED ORDER — FENTANYL CITRATE 0.05 MG/ML IJ SOLN: 12.5000 ug | INTRAMUSCULAR | Status: DC | PRN

## 2012-08-10 MED ORDER — POTASSIUM CHLORIDE 10 MEQ/50ML IV SOLN
INTRAVENOUS | Status: AC
  Filled 2012-08-10: qty 300

## 2012-08-10 MED ORDER — POTASSIUM CHLORIDE 10 MEQ/50ML IV SOLN
10.0000 meq | INTRAVENOUS | Status: AC
  Administered 2012-08-10 (×6): 10 meq via INTRAVENOUS

## 2012-08-10 MED ORDER — FENTANYL CITRATE 0.05 MG/ML IJ SOLN
INTRAMUSCULAR | Status: AC
  Filled 2012-08-10: qty 2

## 2012-08-10 MED ORDER — FUROSEMIDE 10 MG/ML IJ SOLN
40.0000 mg | Freq: Once | INTRAMUSCULAR | Status: AC
  Administered 2012-08-10: 40 mg via INTRAVENOUS
  Filled 2012-08-10: qty 4

## 2012-08-10 MED ORDER — SODIUM CHLORIDE 0.9 % IV SOLN: INTRAVENOUS | Status: DC

## 2012-08-10 MED ORDER — SODIUM CHLORIDE 0.9 % IV SOLN
125.0000 mg | Freq: Three times a day (TID) | INTRAVENOUS | Status: DC
  Administered 2012-08-10 – 2012-08-12 (×7): 125 mg via INTRAVENOUS
  Filled 2012-08-10 (×12): qty 2.5

## 2012-08-10 MED ORDER — FAMOTIDINE IN NACL 20-0.9 MG/50ML-% IV SOLN
20.0000 mg | Freq: Two times a day (BID) | INTRAVENOUS | Status: DC
  Administered 2012-08-10 – 2012-08-12 (×5): 20 mg via INTRAVENOUS
  Filled 2012-08-10 (×7): qty 50

## 2012-08-10 NOTE — Progress Notes (Signed)
RT to check pt cuff pressure. Cuff pressure reading 100. RT try to deflate and pt had noticeable cuff leak. MD notified. Pt has tidal volumes of at least 700. Patient stable at this time.

## 2012-08-10 NOTE — Progress Notes (Signed)
PULMONARY  / CRITICAL CARE MEDICINE  Name: [name] MRN: [id number] DOB: 08-26-1940    ADMISSION DATE:  07/20/2012 CONSULTATION DATE:  1/18/20014   REFERRING MD :  [name] ER  CHIEF COMPLAINT:  Dyspnea  BRIEF PATIENT DESCRIPTION:  6 male smoker transferred from [facility] 07/20/12 with Sepsis, ARDS with PNA from H1N1/Influenza A and pneumococcus.  Significant PMHx of HTN, OSA, Hyperlipidemia, pre-DM  SIGNIFICANT EVENTS: 1/18 Transfer to [facility] from [place] for respiratory failure started on Nimbex protocol for SEVERE ARDS.  1/27 ct chest- free flowing rt effusion small, partial loculated effusion left small  1/29 diagnostic left thora, exudative  1/30 fevers continued, chest tube placement with bleeding. 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 1/27 CT head >> mild atrophy 1/27 CT chest >> atherosclerosis, centrilobular/paraseptal emphysema with subpleural blebs and few bulla, patchy ASD b/l Rt > Lt, LLL consolidation, partially loculated Lt pleural effusion 1/31 CT head >> no acute findings 2/01 EEG >> severe encephalopathy, no epileptiform activity  LINES / TUBES: ETT 1/18 >> 1/31 Trach (DF ) 1/30 >>  Lt chest tube 1/30 >> 2/5 Rt PICC 2/02 >>  CULTURES: 1/18 Blood [name]) >> pneumococcus 1/20 Respiratory viral panel >> Influenza A positive 1/26 Blood >> negative 2/02 Cath tip >> negative  ANTIBIOTICS: 1/18 Vanc >> 1/23 1/18 Levaquin >> 1/23  1/18 Tamiflu >> 1/28  1/18 Cefepime >>1/23  1/23 Rocephin 1/23>>> 1/23  1/24 Levofloxacin >>> 1/27 1/25 Vancomycin >> 2/5 1/30 Clindamycin >> 2/4  SUBJECTIVE:  Decreased urine outpt overnight.    VITAL SIGNS: Temp:  [98.1 F (36.7 C)-101.2 F (38.4 C)] 100.3 F (37.9 C) (02/08 0750) Pulse Rate:  [86-115] 93 (02/08 0750) Resp:  [18-37] 24 (02/08 0750) BP: (106-144)/(69-112) 132/91 mmHg (02/08  0750) SpO2:  [94 %-100 %] 100 % (02/08 0750) FiO2 (%):  [39.6 %-40.3 %] 40 % (02/08 0751) HEMODYNAMICS: CVP:  [5 mmHg] 5 mmHg VENTILATOR SETTINGS: Vent Mode:  [-] PCV FiO2 (%):  [39.6 %-40.3 %] 40 % Set Rate:  [18 bmp] 18 bmp PEEP:  [8 cmH20] 8 cmH20 Plateau Pressure:  [23 cmH20-29 cmH20] 24 cmH20 INTAKE / OUTPUT: Intake/Output     02/07 0701 - 02/08 0700 02/08 0701 - 02/09 0700   I.V. (mL/kg) 766.5 (7)    NG/GT 3760    IV Piggyback 50    Total Intake(mL/kg) 4576.5 (41.6)    Urine (mL/kg/hr) 618 (0.2)    Stool 725 (0.3)    Total Output 1343     Net +3233.5            PHYSICAL EXAMINATION: General:  Ill appearing Neuro:  Opens eyes with stimulation HEENT:  Trach site clean Cardiovascular:  s1s2 regular, tachycardic Lungs: coarse breath sounds Abdomen:  Distended, increased tympany Musculoskeletal:  3+ edema all extremities, scrotal edema Skin:  No rashes  LABS:  Recent Labs Lab 08/04/12 0415 08/05/12 0430  08/06/12 0532 08/06/12 1736  08/07/12 1100 08/08/12 0500 08/09/12 0400 08/10/12 0421  HGB 7.5* 7.7*  < >  --   --   < >  --  8.3* 8.1* 7.9*  WBC 7.1 7.0  < >  --   --   < >  --  9.7 11.6* 13.9*  PLT 116* 143*  < >  --   --   < >  --  190 195 185  NA 145 147*  < >  --   --   < >  --  150* 148* 140  K 3.7 3.6  < >  --   --   < >  --  3.9 3.3* 2.4*  CL 105 108  < >  --   --   < >  --  112 109 101  CO2 35* 36*  < >  --   --   < >  --  33* 33* 28  GLUCOSE 109* 120*  < >  --   --   < >  --  108* 117* 248*  BUN 106* 99*  < >  --   --   < >  --  62* 60* 74*  CREATININE 1.30 1.15  < >  --   --   < >  --  0.99 1.01 1.52*  CALCIUM 8.8 8.7  < >  --   --   < >  --  8.2* 8.1* 7.4*  MG 2.1 2.1  --   --   --   --   --   --   --   --   PHOS 4.1 4.3  --   --   --   --   --   --   --   --   ALBUMIN 1.5*  --   --   --   --   --   --   --   --   --   INR  --  1.14  --   --   --   --   --   --   --   --   PHART  --   --   --  7.409 7.428  --  7.497*  --   --   --   PCO2ART   --   --   --  55.8* 55.0*  --  43.6  --   --   --   PO2ART  --   --   --  61.7* 73.0*  --  49.7*  --   --   --   < > = values in this interval not displayed.  Recent Labs Lab 08/09/12 1542 08/09/12 2007 08/10/12 0017 08/10/12 0347 08/10/12 0740  GLUCAP 122* 120* 132* 111* 114*    CXR:  Dg Chest Port 1 View  08/10/2012  *RADIOLOGY REPORT*  Clinical Data: Follow up strap pneumonia  PORTABLE CHEST - 1 VIEW  Comparison: Prior chest x-ray 08/09/2012  Findings: Tracheostomy tube in unchanged position midline at the level of the clavicles.  Incompletely imaged nasogastric tube, the tip lies below the diaphragm likely within the stomach. Right upper extremity approach PICC with the tip projecting over the superior cavoatrial junction.  Increasing subcutaneous emphysema along the soft tissues of the left lateral chest wall of uncertain etiology. No definite left-sided pneumothorax.  Increasing bilateral interstitial and airspace opacities.  Persistent right greater than left pleural effusions.  Stable cardiomegaly.  IMPRESSION:  1.  Increasing subcutaneous emphysema in the left lateral chest wall of uncertain etiology.  No definite left pneumothorax. 2.  Increased bilateral interstitial and airspace opacities may reflect increasing pulmonary edema or progression of underlying pneumonia. 3.  Similar right larger than left pleural effusions and associated bibasilar opacities. 4.  Stable and satisfactory support apparatus   Original Report Authenticated By: Jacqulynn Cadet, M.D.    Dg Chest Port 1 View  08/09/2012  *RADIOLOGY REPORT*  Clinical Data: Pleural effusion, ARDS  PORTABLE CHEST - 1 VIEW  Comparison: Portable exam 0603 hours compared to 08/07/2012  Findings: Tracheostomy tube and nasogastric tube unchanged. Right arm PICC line tip projecting over SVC. Numerous cardiac monitoring leads project over chest. Enlargement of cardiac silhouette with pulmonary vascular congestion. Diffuse pulmonary  infiltrates greatest at bases, little changed. Associated bibasilar effusions. No gross pneumothorax.  IMPRESSION: Little change in diffuse bilateral pulmonary infiltrates, greatest at bases. Bibasilar effusions.   Original Report Authenticated By: Lavonia Dana, M.D.      ASSESSMENT / PLAN:  PULMONARY A: Acute respiratory failure with ARDS 2nd to H1N1/Influenza A and pneumococcal PNA. Hx of smoking with emphysema on CT chest. Failure to wean from ventilator s/p tracheostomy. He has not been able to wean from ventilator in spite of attempts since tracheostomy 1/30 due to high PEEP/FiO2 demands and mental status. Lt exudative pleural effusion likely from empyema. P:   Adjust PEEP/FiO2 to keep SpO2 > 92% Goal negative fluid balance BD's PRN F/u CXR Lasix 40 mg IV x 1 2/8  CARDIOVASCULAR A: Septic shock from PNA. >> resolved Demand ischemia with NSTEMI. 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 Hx of HTN, Hyperlipidemia. P:  Continue ASA  RENAL A:  Acute renal failure. - improving Hypokalemia. Hypernatremia - resolved P:   F/u renal fx, urine outpt F/u and replace electrolytes as needed D/c free water, D5W  GASTROINTESTINAL A:  Nutrition. Ileus developed 2/8. P:   Hold tube feeds NG tube to suction Pepcid for SUP  HEMATOLOGIC A:  Anemia of critical illness. Thrombocytopenia >> resolved P:  F/u CBC SQ heparin for DVT prophylaxis  INFECTIOUS A:  PNA/bacteremia from influenza and pneumococcus. Lt Empyema. Fever >> improved 2/8. P:   Abx per ID - off all abx on 2/5 >> continued fevers Antibiotics only if otherwise unstable  ENDOCRINE A:  DM with hyperglycemia. Relative adrenal insufficiency.>> resolved P:   SSI  NEUROLOGIC A:  Acute encephalopathy. Myoclonus. - ? resolved P:   Neurology signed off >> may need to reconsult once off sedating meds for ?continuation of dilatin Continue dilantin for now PRN versed while on vent Avoid opiates with  ileus  DERMATOLOGIC A: Drug rash 2nd to cephalosporins >> improving. P: Monitor   Will continue to work toward LTAC placement when medical ready.  CC time 35 minutes  Chesley Mires, MD [facility] Pulmonary/Critical Care 08/10/2012, 10:13 AM Pager:  [phone] After 3pm call: [phone]

## 2012-08-10 NOTE — Progress Notes (Signed)
E-Link notified that there was a second positive blood culture from an aerobic bottle drawn 2/7 at 0812. Altamese Dilling RN given the message. Dr. Earnest Conroy is aware of the same results from a previous blood culture report called earlier this evening. Altamese Dilling will let Dr. Earnest Conroy know. Vancomycin was already restarted on the patient.

## 2012-08-10 NOTE — Progress Notes (Addendum)
MEDICATION RELATED CONSULT NOTE - FOLLOW UP   Pharmacy Consult:  Dilantin Indication:  history of seizure  Allergies  Allergen Reactions  . Cephalosporins Rash    Patient Measurements: Height: 5\' 7"  (170.2 cm) Weight: 242 lb 4.6 oz (109.9 kg) IBW/kg (Calculated) : 66.1  Vital Signs: Temp: 100.3 F (37.9 C) (02/08 0750) Temp src: Oral (02/08 0750) BP: 132/91 mmHg (02/08 0750) Pulse Rate: 93 (02/08 0750) Intake/Output from previous day: 02/07 0701 - 02/08 0700 In: 4576.5 [I.V.:766.5; NG/GT:3760; IV Piggyback:50] Out: [id number] [Urine:618; Stool:725]  Labs:  Recent Labs  08/08/12 0500 08/09/12 0400 08/10/12 0421  WBC 9.7 11.6* 13.9*  HGB 8.3* 8.1* 7.9*  HCT 26.1* 25.9* 24.6*  PLT 190 195 185  CREATININE 0.99 1.01 1.52*   Estimated Creatinine Clearance: 53.5 ml/min (by C-G formula based on Cr of 1.52).     Assessment: 40 YOM to continue on Dilantin for seizure control.  Patient was previously therapeutic on Dilantin 125mg  IV Q8H.  With improvement, Dilantin was changed to per tube on 08/06/12.  He now develops an ileus and Dilantin is switched back to IV.  His renal function has worsen; no new seizure reported.   1/31: suspected sz, DPH load 1250mg  IV given. Started MD of 125 TID (~5-6/kg Adj.BW) 2/1 MD checked level, 7.1 - corrects to ~ 16.9; MD did not correct level - gave extra 200 mg. EEG negative 2/2 MD checked level (to ensure not toxic) = 6.8, corrected = 17 2/4: MD changed to per tube, patient on TF. No further sz, would recheck level 2/10 2/8: MD changed DPH to IV d/t diarrhea, no seizure - keep level on 2/11   Goal of Therapy:  DPH level 10-20 mcg/mL   Plan:  - Dilantin 125mg  IV TID - Recheck dilantin level 2/11 AM - ordered - F/U resume home meds, K+, C.diff PCR    Brie Eppard D. Mina Marble, PharmD, BCPS Pager:  [phone] 08/10/2012, 10:54 AM

## 2012-08-10 NOTE — Progress Notes (Signed)
ANTIBIOTIC CONSULT NOTE - INITIAL  Pharmacy Consult:  Vancomycin Indication:  Suspected PNA / GPC bacteremia  Allergies  Allergen Reactions  . Cephalosporins Rash    Patient Measurements: Height: 5\' 7"  (170.2 cm) Weight: 242 lb 4.6 oz (109.9 kg) IBW/kg (Calculated) : 66.1  Vital Signs: Temp: 100.1 F (37.8 C) (02/08 1140) Temp src: Oral (02/08 1140) BP: 130/79 mmHg (02/08 1400) Pulse Rate: 100 (02/08 1400) Intake/Output from previous day: 02/07 0701 - 02/08 0700 In: 4596.5 [I.V.:786.5; NG/GT:3760; IV Piggyback:50] Out: [id number] [Urine:618; Stool:725] Intake/Output from this shift: Total I/O In: 360 [I.V.:110; IV Piggyback:250] Out: 245 [Urine:245]  Labs:  Recent Labs  08/08/12 0500 08/09/12 0400 08/10/12 0421  WBC 9.7 11.6* 13.9*  HGB 8.3* 8.1* 7.9*  PLT 190 195 185  CREATININE 0.99 1.01 1.52*   Estimated Creatinine Clearance: 53.5 ml/min (by C-G formula based on Cr of 1.52). No results found for this basename: VANCOTROUGH, VANCOPEAK, VANCORANDOM, GENTTROUGH, GENTPEAK, GENTRANDOM, TOBRATROUGH, TOBRAPEAK, TOBRARND, AMIKACINPEAK, AMIKACINTROU, AMIKACIN,  in the last 72 hours       Assessment: 75 YOM s/p treatment for Strep pneumo bacteremia, CAP, and empyema to resume vancomycin for suspected PNA and blood culture growing GPC.  Previously therapeutic on vancomycin, but her renal function has worsen since the trough was obtained.  Tamiflu 1/18 > 1/28 Vanc 1/18 > 1/23; 1/25 >2/5 (LOT for 4-6 wks), 2/8 >> Clinda 1/30 > 2/4 (per Dr.Campbell)  PTA BCx [name]) > S pneumo (pan sens) 1/18 BCx negative 1/18 Sputum neg (gram stain, GPC in pairs) 1/18: UCx - negative 1/29 pleural fluid cx - ngtd 2/7 blood cx - GPC 2/4 urine cx - yeast 2/8 C.diff PCR - negative   Goal of Therapy:  Vancomycin trough level 15-20 mcg/ml   Plan:  - Vanc 1250mg  IV Q24H - Continue Dilantin 125mg  IV TID - Recheck dilantin level 2/11 AM - Monitor renal fxn, clinical course, vanc  trough as appropriate    Cordie Beazley D. Mina Marble, PharmD, BCPS Pager:  [phone] 08/10/2012, 3:54 PM

## 2012-08-10 NOTE — Progress Notes (Signed)
INFECTIOUS DISEASE PROGRESS NOTE  ID: [name] is a 72 y.o. male with  Active Problems:   Acute respiratory failure with hypoxia   ARDS (adult respiratory distress syndrome)   Acute renal failure   Other convulsions   Abnormal involuntary movement  Subjective: Awakens, interacts with family. continued fever, diarrhea, abd distension.   Abtx:  Anti-infectives   Start     Dose/Rate Route Frequency Ordered Stop   08/06/12 0000  vancomycin (VANCOCIN) 1,500 mg in sodium chloride 0.9 % 500 mL IVPB  Status:  Discontinued     1,500 mg 250 mL/hr over 120 Minutes Intravenous Every 24 hours 08/05/12 2335 08/07/12 1147   08/01/12 1400  clindamycin (CLEOCIN) IVPB 300 mg  Status:  Discontinued     300 mg 100 mL/hr over 30 Minutes Intravenous 3 times per day 08/01/12 1109 08/06/12 1349   07/30/12 2300  vancomycin (VANCOCIN) 1,250 mg in sodium chloride 0.9 % 250 mL IVPB  Status:  Discontinued     1,250 mg 166.7 mL/hr over 90 Minutes Intravenous Every 24 hours 07/30/12 1109 08/05/12 2334   07/28/12 2300  vancomycin (VANCOCIN) IVPB 1000 mg/200 mL premix  Status:  Discontinued     1,000 mg 200 mL/hr over 60 Minutes Intravenous Every 24 hours 07/27/12 2142 07/30/12 1129   07/27/12 2215  vancomycin (VANCOCIN) 1,250 mg in sodium chloride 0.9 % 250 mL IVPB     1,250 mg 166.7 mL/hr over 90 Minutes Intravenous NOW 07/27/12 2142 07/27/12 2355   07/26/12 2200  levofloxacin (LEVAQUIN) IVPB 750 mg  Status:  Discontinued     750 mg 100 mL/hr over 90 Minutes Intravenous Every 48 hours 07/26/12 0940 07/29/12 1337   07/25/12 1500  cefTRIAXone (ROCEPHIN) 2 g in dextrose 5 % 50 mL IVPB  Status:  Discontinued     2 g 100 mL/hr over 30 Minutes Intravenous Every 24 hours 07/25/12 1401 07/26/12 0921   07/23/12 1300  vancomycin (VANCOCIN) 1,250 mg in sodium chloride 0.9 % 250 mL IVPB  Status:  Discontinued     1,250 mg 166.7 mL/hr over 90 Minutes Intravenous Every 24 hours 07/23/12 1158 07/25/12 1356   07/22/12 1000  oseltamivir (TAMIFLU) 6 MG/ML suspension 150 mg     150 mg Per Tube Daily 07/21/12 1103 07/30/12 1034   07/22/12 0530  vancomycin (VANCOCIN) 1,500 mg in sodium chloride 0.9 % 500 mL IVPB  Status:  Discontinued     1,500 mg 250 mL/hr over 120 Minutes Intravenous Every 48 hours 07/22/12 0525 07/23/12 1158   07/21/12 1000  oseltamivir (TAMIFLU) capsule 150 mg  Status:  Discontinued     150 mg Oral Daily 07/21/12 0936 07/21/12 1103   07/20/12 2300  levofloxacin (LEVAQUIN) IVPB 750 mg  Status:  Discontinued     750 mg 100 mL/hr over 90 Minutes Intravenous Every 48 hours 07/20/12 1847 07/25/12 1356   07/20/12 2200  oseltamivir (TAMIFLU) capsule 75 mg  Status:  Discontinued     75 mg Oral 2 times daily 07/20/12 1824 07/21/12 0936   07/20/12 2100  vancomycin (VANCOCIN) 2,000 mg in sodium chloride 0.9 % 500 mL IVPB     2,000 mg 250 mL/hr over 120 Minutes Intravenous  Once 07/20/12 1847 07/20/12 2300   07/20/12 2000  ceFEPIme (MAXIPIME) 1 g in dextrose 5 % 50 mL IVPB  Status:  Discontinued     1 g 100 mL/hr over 30 Minutes Intravenous Every 24 hours 07/20/12 1847 07/25/12 1356  Medications:  Scheduled: . fentaNYL      . antiseptic oral rinse  15 mL Mouth Rinse QID  . aspirin  81 mg Per Tube Daily  . chlorhexidine  15 mL Mouth Rinse BID  . famotidine (PEPCID) IV  20 mg Intravenous Q12H  . feeding supplement (OXEPA)  1,000 mL Per Tube Q24H  . feeding supplement  60 mL Per Tube TID  . furosemide  40 mg Intravenous Once  . insulin aspart  0-20 Units Subcutaneous Q4H  . insulin glargine  25 Units Subcutaneous Q24H  . magnesium sulfate 1 - 4 g bolus IVPB  2 g Intravenous Once  . multivitamin  5 mL Per Tube Daily  . phenytoin (DILANTIN) IV  125 mg Intravenous Q8H  . potassium chloride  10 mEq Intravenous Q1 Hr x 6  . sodium chloride  10-40 mL Intracatheter Q12H    Objective: Vital signs in last 24 hours: Temp:  [98.1 F (36.7 C)-101.2 F (38.4 C)] 100.3 F (37.9 C)  (02/08 0800) Pulse Rate:  [86-115] 93 (02/08 0750) Resp:  [18-37] 24 (02/08 0750) BP: (106-144)/(69-112) 132/91 mmHg (02/08 0750) SpO2:  [94 %-100 %] 100 % (02/08 0750) FiO2 (%):  [39.6 %-40.3 %] 40 % (02/08 0751)   General appearance: fatigued Resp: diminished breath sounds bilaterally Cardio: regular rate and rhythm GI: abnormal findings:  distended and hypoactive bowel sounds Extremities: edema anasarca  Lab Results  Recent Labs  08/09/12 0400 08/10/12 0421  WBC 11.6* 13.9*  HGB 8.1* 7.9*  HCT 25.9* 24.6*  NA 148* 140  K 3.3* 2.4*  CL 109 101  CO2 33* 28  BUN 60* 74*  CREATININE 1.01 1.52*   Liver Panel No results found for this basename: PROT, ALBUMIN, AST, ALT, ALKPHOS, BILITOT, BILIDIR, IBILI,  in the last 72 hours Sedimentation Rate No results found for this basename: ESRSEDRATE,  in the last 72 hours C-Reactive Protein No results found for this basename: CRP,  in the last 72 hours  Microbiology: Recent Results (from the past 240 hour(s))  BODY FLUID CULTURE     Status: None   Collection Time    07/31/12 11:47 AM      Result Value Range Status   Specimen Description PLEURAL FLUID LEFT   Final   Special Requests 3.0 ML FLUID   Final   Gram Stain     Final   Value: MODERATE WBC PRESENT,BOTH PMN AND MONONUCLEAR     NO ORGANISMS SEEN   Culture NO GROWTH 3 DAYS   Final   Report Status 08/03/2012 FINAL   Final  CULTURE, RESPIRATORY     Status: None   Collection Time    08/01/12  3:20 PM      Result Value Range Status   Specimen Description TRACHEAL ASPIRATE   Final   Special Requests NONE   Final   Gram Stain     Final   Value: FEW WBC PRESENT,BOTH PMN AND MONONUCLEAR     NO ORGANISMS SEEN   Culture NO GROWTH 2 DAYS   Final   Report Status 08/03/2012 FINAL   Final  BODY FLUID CULTURE     Status: None   Collection Time    08/01/12  6:31 PM      Result Value Range Status   Specimen Description PLEURAL FLUID LEFT   Final   Special Requests NONE   Final     Gram Stain     Final   Value: FEW WBC PRESENT,BOTH PMN  AND MONONUCLEAR     NO ORGANISMS SEEN   Culture NO GROWTH 3 DAYS   Final   Report Status 08/05/2012 FINAL   Final  CLOSTRIDIUM DIFFICILE BY PCR     Status: None   Collection Time    08/01/12  7:45 PM      Result Value Range Status   C difficile by pcr NEGATIVE  NEGATIVE Final  CATH TIP CULTURE     Status: None   Collection Time    08/04/12  4:28 PM      Result Value Range Status   Specimen Description CATH TIP CENTRAL LINE   Final   Special Requests NONE   Final   Culture NO GROWTH 2 DAYS   Final   Report Status 08/07/2012 FINAL   Final  CLOSTRIDIUM DIFFICILE BY PCR     Status: None   Collection Time    08/09/12 10:54 AM      Result Value Range Status   C difficile by pcr NEGATIVE  NEGATIVE Final    Studies/Results: Dg Chest Port 1 View  08/10/2012  *RADIOLOGY REPORT*  Clinical Data: Follow up strap pneumonia  PORTABLE CHEST - 1 VIEW  Comparison: Prior chest x-ray 08/09/2012  Findings: Tracheostomy tube in unchanged position midline at the level of the clavicles.  Incompletely imaged nasogastric tube, the tip lies below the diaphragm likely within the stomach. Right upper extremity approach PICC with the tip projecting over the superior cavoatrial junction.  Increasing subcutaneous emphysema along the soft tissues of the left lateral chest wall of uncertain etiology. No definite left-sided pneumothorax.  Increasing bilateral interstitial and airspace opacities.  Persistent right greater than left pleural effusions.  Stable cardiomegaly.  IMPRESSION:  1.  Increasing subcutaneous emphysema in the left lateral chest wall of uncertain etiology.  No definite left pneumothorax. 2.  Increased bilateral interstitial and airspace opacities may reflect increasing pulmonary edema or progression of underlying pneumonia. 3.  Similar right larger than left pleural effusions and associated bibasilar opacities. 4.  Stable and satisfactory support  apparatus   Original Report Authenticated By: Jacqulynn Cadet, M.D.    Dg Chest Port 1 View  08/09/2012  *RADIOLOGY REPORT*  Clinical Data: Pleural effusion, ARDS  PORTABLE CHEST - 1 VIEW  Comparison: Portable exam 0603 hours compared to 08/07/2012  Findings: Tracheostomy tube and nasogastric tube unchanged. Right arm PICC line tip projecting over SVC. Numerous cardiac monitoring leads project over chest. Enlargement of cardiac silhouette with pulmonary vascular congestion. Diffuse pulmonary infiltrates greatest at bases, little changed. Associated bibasilar effusions. No gross pneumothorax.  IMPRESSION: Little change in diffuse bilateral pulmonary infiltrates, greatest at bases. Bibasilar effusions.   Original Report Authenticated By: Lavonia Dana, M.D.      Assessment/Plan: ICU fever Empyema Bacteremia Abd distension  Will re-order C diff.  check abd films.  Repeat BCx pending.  Continue to watch off anbx, consider starting c diff therapy empirically  Bobby Rumpf Infectious Diseases [id number] 08/10/2012, 10:46 AM   LOS: 21 days

## 2012-08-10 NOTE — Progress Notes (Signed)
Positive blood cultures called to Dr. Earnest Conroy in [place]. Report of aerobic bottle gram + cocci in clusters drawn 08/09/12 from the left hand . Vancomycin restarted per Dr. Earnest Conroy.

## 2012-08-11 ENCOUNTER — Inpatient Hospital Stay (HOSPITAL_COMMUNITY): Payer: BC Managed Care – PPO

## 2012-08-11 LAB — BASIC METABOLIC PANEL
CO2: 28 mEq/L (ref 19–32)
CO2: 28 mEq/L (ref 19–32)
Calcium: 7.9 mg/dL — ABNORMAL LOW (ref 8.4–10.5)
Chloride: 110 mEq/L (ref 96–112)
Glucose, Bld: 106 mg/dL — ABNORMAL HIGH (ref 70–99)
Potassium: 2.9 mEq/L — ABNORMAL LOW (ref 3.5–5.1)
Potassium: 3 mEq/L — ABNORMAL LOW (ref 3.5–5.1)
Sodium: 145 mEq/L (ref 135–145)
Sodium: 147 mEq/L — ABNORMAL HIGH (ref 135–145)

## 2012-08-11 LAB — CBC
HCT: 24 % — ABNORMAL LOW (ref 39.0–52.0)
Hemoglobin: 7.7 g/dL — ABNORMAL LOW (ref 13.0–17.0)
MCH: 30.2 pg (ref 26.0–34.0)
MCV: 94.1 fL (ref 78.0–100.0)
RBC: 2.55 MIL/uL — ABNORMAL LOW (ref 4.22–5.81)

## 2012-08-11 LAB — GLUCOSE, CAPILLARY: Glucose-Capillary: 99 mg/dL (ref 70–99)

## 2012-08-11 MED ORDER — LIDOCAINE HCL (PF) 1 % IJ SOLN
INTRAMUSCULAR | Status: AC
  Filled 2012-08-11: qty 5

## 2012-08-11 MED ORDER — SODIUM CHLORIDE 0.9 % IJ SOLN
10.0000 mL | Freq: Two times a day (BID) | INTRAMUSCULAR | Status: DC
  Administered 2012-08-11: 10 mL

## 2012-08-11 MED ORDER — FUROSEMIDE 10 MG/ML IJ SOLN
40.0000 mg | Freq: Once | INTRAMUSCULAR | Status: AC
  Administered 2012-08-11: 40 mg via INTRAVENOUS
  Filled 2012-08-11: qty 4

## 2012-08-11 MED ORDER — POTASSIUM CHLORIDE 10 MEQ/50ML IV SOLN
10.0000 meq | INTRAVENOUS | Status: AC
  Administered 2012-08-11 (×6): 10 meq via INTRAVENOUS
  Filled 2012-08-11: qty 300

## 2012-08-11 MED ORDER — ACETAMINOPHEN 650 MG RE SUPP: 650.0000 mg | Freq: Four times a day (QID) | RECTAL | Status: DC | PRN

## 2012-08-11 MED ORDER — POTASSIUM CHLORIDE 20 MEQ/15ML (10%) PO LIQD: 40.0000 meq | ORAL | Status: DC

## 2012-08-11 MED ORDER — MAGNESIUM SULFATE 40 MG/ML IJ SOLN
2.0000 g | Freq: Once | INTRAMUSCULAR | Status: AC
  Administered 2012-08-11: 2 g via INTRAVENOUS
  Filled 2012-08-11: qty 50

## 2012-08-11 MED ORDER — POTASSIUM CHLORIDE 10 MEQ/50ML IV SOLN
10.0000 meq | INTRAVENOUS | Status: AC
  Administered 2012-08-11 – 2012-08-12 (×6): 10 meq via INTRAVENOUS
  Filled 2012-08-11: qty 300

## 2012-08-11 MED ORDER — SODIUM CHLORIDE 0.9 % IJ SOLN: 10.0000 mL | INTRAMUSCULAR | Status: DC | PRN

## 2012-08-11 NOTE — Progress Notes (Signed)
INFECTIOUS DISEASE PROGRESS NOTE  ID: [name] is a 72 y.o. male with  Active Problems:   Acute respiratory failure with hypoxia   ARDS (adult respiratory distress syndrome)   Acute renal failure   Other convulsions   Abnormal involuntary movement  Subjective: More awake this AM  Abtx:  Anti-infectives   Start     Dose/Rate Route Frequency Ordered Stop   08/10/12 1700  vancomycin (VANCOCIN) 1,250 mg in sodium chloride 0.9 % 250 mL IVPB     1,250 mg 166.7 mL/hr over 90 Minutes Intravenous Every 24 hours 08/10/12 1557     08/06/12 0000  vancomycin (VANCOCIN) 1,500 mg in sodium chloride 0.9 % 500 mL IVPB  Status:  Discontinued     1,500 mg 250 mL/hr over 120 Minutes Intravenous Every 24 hours 08/05/12 2335 08/07/12 1147   08/01/12 1400  clindamycin (CLEOCIN) IVPB 300 mg  Status:  Discontinued     300 mg 100 mL/hr over 30 Minutes Intravenous 3 times per day 08/01/12 1109 08/06/12 1349   07/30/12 2300  vancomycin (VANCOCIN) 1,250 mg in sodium chloride 0.9 % 250 mL IVPB  Status:  Discontinued     1,250 mg 166.7 mL/hr over 90 Minutes Intravenous Every 24 hours 07/30/12 1109 08/05/12 2334   07/28/12 2300  vancomycin (VANCOCIN) IVPB 1000 mg/200 mL premix  Status:  Discontinued     1,000 mg 200 mL/hr over 60 Minutes Intravenous Every 24 hours 07/27/12 2142 07/30/12 1129   07/27/12 2215  vancomycin (VANCOCIN) 1,250 mg in sodium chloride 0.9 % 250 mL IVPB     1,250 mg 166.7 mL/hr over 90 Minutes Intravenous NOW 07/27/12 2142 07/27/12 2355   07/26/12 2200  levofloxacin (LEVAQUIN) IVPB 750 mg  Status:  Discontinued     750 mg 100 mL/hr over 90 Minutes Intravenous Every 48 hours 07/26/12 0940 07/29/12 1337   07/25/12 1500  cefTRIAXone (ROCEPHIN) 2 g in dextrose 5 % 50 mL IVPB  Status:  Discontinued     2 g 100 mL/hr over 30 Minutes Intravenous Every 24 hours 07/25/12 1401 07/26/12 0921   07/23/12 1300  vancomycin (VANCOCIN) 1,250 mg in sodium chloride 0.9 % 250 mL IVPB  Status:   Discontinued     1,250 mg 166.7 mL/hr over 90 Minutes Intravenous Every 24 hours 07/23/12 1158 07/25/12 1356   07/22/12 1000  oseltamivir (TAMIFLU) 6 MG/ML suspension 150 mg     150 mg Per Tube Daily 07/21/12 1103 07/30/12 1034   07/22/12 0530  vancomycin (VANCOCIN) 1,500 mg in sodium chloride 0.9 % 500 mL IVPB  Status:  Discontinued     1,500 mg 250 mL/hr over 120 Minutes Intravenous Every 48 hours 07/22/12 0525 07/23/12 1158   07/21/12 1000  oseltamivir (TAMIFLU) capsule 150 mg  Status:  Discontinued     150 mg Oral Daily 07/21/12 0936 07/21/12 1103   07/20/12 2300  levofloxacin (LEVAQUIN) IVPB 750 mg  Status:  Discontinued     750 mg 100 mL/hr over 90 Minutes Intravenous Every 48 hours 07/20/12 1847 07/25/12 1356   07/20/12 2200  oseltamivir (TAMIFLU) capsule 75 mg  Status:  Discontinued     75 mg Oral 2 times daily 07/20/12 1824 07/21/12 0936   07/20/12 2100  vancomycin (VANCOCIN) 2,000 mg in sodium chloride 0.9 % 500 mL IVPB     2,000 mg 250 mL/hr over 120 Minutes Intravenous  Once 07/20/12 1847 07/20/12 2300   07/20/12 2000  ceFEPIme (MAXIPIME) 1 g in dextrose 5 %  50 mL IVPB  Status:  Discontinued     1 g 100 mL/hr over 30 Minutes Intravenous Every 24 hours 07/20/12 1847 07/25/12 1356      Medications:  Scheduled: . antiseptic oral rinse  15 mL Mouth Rinse QID  . aspirin  81 mg Per Tube Daily  . chlorhexidine  15 mL Mouth Rinse BID  . famotidine (PEPCID) IV  20 mg Intravenous Q12H  . feeding supplement (OXEPA)  1,000 mL Per Tube Q24H  . insulin aspart  0-20 Units Subcutaneous Q4H  . phenytoin (DILANTIN) IV  125 mg Intravenous Q8H  . potassium chloride  10 mEq Intravenous Q1 Hr x 6  . sodium chloride  10-40 mL Intracatheter Q12H  . vancomycin  1,250 mg Intravenous Q24H    Objective: Vital signs in last 24 hours: Temp:  [99 F (37.2 C)-102.5 F (39.2 C)] 99 F (37.2 C) (02/09 0738) Pulse Rate:  [81-103] 84 (02/09 0800) Resp:  [18-40] 27 (02/09 0800) BP:  (99-137)/(62-88) 105/67 mmHg (02/09 0800) SpO2:  [91 %-100 %] 98 % (02/09 0800) FiO2 (%):  [39.6 %-41 %] 40.2 % (02/09 0800) Weight:  [109.3 kg (240 lb 15.4 oz)] 109.3 kg (240 lb 15.4 oz) (02/09 0500)   General appearance: alert and no distress Resp: rhonchi bilaterally and tachpneic Cardio: regular rate and rhythm GI: normal findings: bowel sounds normal and abnormal findings:  distended Extremities: edema anasarca, RUE PIC  Lab Results  Recent Labs  08/10/12 0421 08/10/12 1800 08/11/12 0504  WBC 13.9*  --  16.7*  HGB 7.9*  --  7.7*  HCT 24.6*  --  24.0*  NA 140 140 145  K 2.4* 3.4* 2.9*  CL 101 103 107  CO2 28 27 28   BUN 74* 79* 84*  CREATININE 1.52* 1.94* 1.95*   Liver Panel No results found for this basename: PROT, ALBUMIN, AST, ALT, ALKPHOS, BILITOT, BILIDIR, IBILI,  in the last 72 hours Sedimentation Rate No results found for this basename: ESRSEDRATE,  in the last 72 hours C-Reactive Protein No results found for this basename: CRP,  in the last 72 hours  Microbiology: Recent Results (from the past 240 hour(s))  CULTURE, RESPIRATORY     Status: None   Collection Time    08/01/12  3:20 PM      Result Value Range Status   Specimen Description TRACHEAL ASPIRATE   Final   Special Requests NONE   Final   Gram Stain     Final   Value: FEW WBC PRESENT,BOTH PMN AND MONONUCLEAR     NO ORGANISMS SEEN   Culture NO GROWTH 2 DAYS   Final   Report Status 08/03/2012 FINAL   Final  BODY FLUID CULTURE     Status: None   Collection Time    08/01/12  6:31 PM      Result Value Range Status   Specimen Description PLEURAL FLUID LEFT   Final   Special Requests NONE   Final   Gram Stain     Final   Value: FEW WBC PRESENT,BOTH PMN AND MONONUCLEAR     NO ORGANISMS SEEN   Culture NO GROWTH 3 DAYS   Final   Report Status 08/05/2012 FINAL   Final  CLOSTRIDIUM DIFFICILE BY PCR     Status: None   Collection Time    08/01/12  7:45 PM      Result Value Range Status   C difficile  by pcr NEGATIVE  NEGATIVE Final  CATH TIP CULTURE  Status: None   Collection Time    08/04/12  4:28 PM      Result Value Range Status   Specimen Description CATH TIP CENTRAL LINE   Final   Special Requests NONE   Final   Culture NO GROWTH 2 DAYS   Final   Report Status 08/07/2012 FINAL   Final  CULTURE, BLOOD (ROUTINE X 2)     Status: None   Collection Time    08/09/12  8:08 AM      Result Value Range Status   Specimen Description BLOOD LEFT HAND   Final   Special Requests BOTTLES DRAWN AEROBIC ONLY 10CC   Final   Culture  Setup Time 08/09/2012 12:55   Final   Culture     Final   Value: GRAM POSITIVE COCCI IN CLUSTERS     Note: Gram Stain Report Called to,Read Back By and Verified With: SARA GROCE 08/10/12 @ 2:54PM BY RUSCA.   Report Status PENDING   Incomplete  CULTURE, BLOOD (ROUTINE X 2)     Status: None   Collection Time    08/09/12  8:12 AM      Result Value Range Status   Specimen Description BLOOD LEFT HAND   Final   Special Requests BOTTLES DRAWN AEROBIC ONLY 10CC   Final   Culture  Setup Time 08/09/2012 12:55   Final   Culture     Final   Value: GRAM POSITIVE COCCI IN CLUSTERS     Note: Gram Stain Report Called to,Read Back By and Verified With: SARA GROCE 08/10/12 @ 8:20PM BY RUSCA.   Report Status PENDING   Incomplete  URINE CULTURE     Status: None   Collection Time    08/09/12  9:30 AM      Result Value Range Status   Specimen Description URINE, CATHETERIZED   Final   Special Requests Normal   Final   Culture  Setup Time 08/09/2012 10:12   Final   Colony Count >=100,000 COLONIES/ML   Final   Culture YEAST   Final   Report Status 08/10/2012 FINAL   Final  CLOSTRIDIUM DIFFICILE BY PCR     Status: None   Collection Time    08/09/12 10:54 AM      Result Value Range Status   C difficile by pcr NEGATIVE  NEGATIVE Final  CLOSTRIDIUM DIFFICILE BY PCR     Status: None   Collection Time    08/10/12 11:37 AM      Result Value Range Status   C difficile by pcr  NEGATIVE  NEGATIVE Final    Studies/Results: Dg Chest Port 1 View  08/11/2012  *RADIOLOGY REPORT*  Clinical Data: Follow up pleural effusions  PORTABLE CHEST - 1 VIEW  Comparison: Prior chest x-ray 08/10/2012  Findings: Stable position of tracheostomy tube, the visualized portion of the nasogastric tube and the right upper extremity approach PICC.  PICC catheter tip again projects over the mid superior vena cava.  Interval development of a small left-sided pneumothorax.  Additionally, there is increasing subcutaneous air in the left lateral chest wall which now extends medially along the fibers of the left pectoralis major muscle.  Inspiratory volumes are slightly improved with persistent but decreasing bibasilar opacities.  Similar to slightly smaller right pleural effusion. Stable cardiomegaly and mediastinal contours.  IMPRESSION:  1.  Interval development of a small left pneumothorax and increasing subcutaneous emphysema along the left lateral chest wall now tracking along the muscle fibers of the  left pectoralis muscle. 2.  Slightly decreased right pleural effusion and bibasilar opacities.  These results were called by telephone on 08/11/2012 at 07:35 a.m. to the patient's nurse, Judson Roch, who verbally acknowledged these results.   Original Report Authenticated By: Jacqulynn Cadet, M.D.    Dg Chest Port 1 View  08/10/2012  *RADIOLOGY REPORT*  Clinical Data: Follow up strap pneumonia  PORTABLE CHEST - 1 VIEW  Comparison: Prior chest x-ray 08/09/2012  Findings: Tracheostomy tube in unchanged position midline at the level of the clavicles.  Incompletely imaged nasogastric tube, the tip lies below the diaphragm likely within the stomach. Right upper extremity approach PICC with the tip projecting over the superior cavoatrial junction.  Increasing subcutaneous emphysema along the soft tissues of the left lateral chest wall of uncertain etiology. No definite left-sided pneumothorax.  Increasing bilateral  interstitial and airspace opacities.  Persistent right greater than left pleural effusions.  Stable cardiomegaly.  IMPRESSION:  1.  Increasing subcutaneous emphysema in the left lateral chest wall of uncertain etiology.  No definite left pneumothorax. 2.  Increased bilateral interstitial and airspace opacities may reflect increasing pulmonary edema or progression of underlying pneumonia. 3.  Similar right larger than left pleural effusions and associated bibasilar opacities. 4.  Stable and satisfactory support apparatus   Original Report Authenticated By: Jacqulynn Cadet, M.D.    Dg Abd Portable 1v  08/11/2012  *RADIOLOGY REPORT*  Clinical Data: Abdominal distention  PORTABLE ABDOMEN - 1 VIEW  Comparison: Plain film 08/10/2012  Findings: NG tube with tip in the stomach.  There are gas-filled loops of large and small bowel which are not pathologically distended.  No evidence of gas within the rectum.  No significant change from prior.  IMPRESSION: Findings consistent with persistent partial small bowel obstruction or ileus.   Original Report Authenticated By: Suzy Bouchard, M.D.    Dg Abd Portable 1v  08/10/2012  *RADIOLOGY REPORT*  Clinical Data: Ileus  PORTABLE ABDOMEN - 1 VIEW  Comparison: CT abdomen 08/01/2012  Findings: NG tube extends to the stomach.  There are no dilated loops of  large or small bowel. There is gas throughout the small bowel and colon. No gas in the rectum.  IMPRESSION: NG tube in stomach.  No clear evidence of ileus or obstruction.   Original Report Authenticated By: Suzy Bouchard, M.D.      Assessment/Plan: ICU fever  Empyema  Bacteremia (repeat)  2/2 BCx 2-7 GPC SBO/Ileus  He is back on vanco Gone for CT to re-eval his previous empyema Would consider echo, repeat his BCx Pull PIC  Bobby Rumpf Infectious Diseases [id number] 08/11/2012, 9:55 AM   LOS: 22 days   Staphylococcus aureus bacteremia (SAB) is associated with a high rate of complications and  mortality.  Specific aspects of clinical management are critical to optimizing the outcome of patients with SAB.  Therefore, the [facility]imicrobial Management Team ([facility]) have initiated an intervention aimed at improving the management of SAB at [facility].  To do so, Infect[facility] Diseases Consultants are providing evidence-based recommendations for the management of all patients with SAB.  The specific recommendations for this patient are marked "X" in this document.  Recommended [x  ]  Completed [08-11-12]  Perform follow-up blood cultures (even if the patient is afebrile) to ensure clearance of bacteremia.  Recommended [x ]  Completed [date]   Remove vascular catheter, and obtain follow-up cultures after removal of catheter  Recommended [ x ]  Completed [date]   Perform echocardiography to evaluate for  endocarditis (transthoracic ECHO is 40-50% sensitive, TEE is > 90% sensitive).*  Please keep in mind, that neither test can definitively EXCLUDE endocarditis, and that should clinical suspicion remain high for endocarditis the patient should then still be treated with an "endocarditis" duration of therapy = 6 weeks  Recommended [  ]  Completed [date]   Consult electrophysiologist to evaluate implanted cardiac device (pacemaker, ICD)  Recommended [  x]  Completed [08-09-12]   Ensure source control- f/u CT  Have all abscesses been drained effectively? Have deep seeded infections (septic joints or osteomyelitis) had appropriate surgical debridement?  Recommended [  ]  Completed [date]   Investigate for "metastatic" sites of infection.   Does the patient have ANY symptom or physical exam finding that would suggest a deeper infection (back or neck pain that may be suggestive of vertebral osteomyelitis or epidural abscess, muscle pain that could be a symptom of pyomyositis)?  Keep in mind that for deep seeded infections MRI imaging with contrast is preferred rather than other often insensitive  tests such as plain x-rays, especially early in a patient's presentation.   Recommended [  ]  Completed [date]  Change antibiotic regimen to __________________________.  (If on vancomycin, goal trough should be 15 - 20 mg/L)  [  ]  Estimated duration of IV  Antibiotic therapy:  2 to 6 weeks (dependent on diagnostic test results).    [  ]  Consult case management for probable prolonged outpatient intravenous antibiotic therapy.

## 2012-08-11 NOTE — Progress Notes (Signed)
Peripherally Inserted Central Catheter/Midline Placement  The IV Nurse has discussed with the patient and/or persons authorized to consent for the patient, the purpose of this procedure and the potential benefits and risks involved with this procedure.  The benefits include less needle sticks, lab draws from the catheter and patient may be discharged home with the catheter.  Risks include, but not limited to, infection, bleeding, blood clot (thrombus formation), and puncture of an artery; nerve damage and irregular heat beat.  Alternatives to this procedure were also discussed.  PICC/Midline Placement Documentation  PICC Triple Lumen [id number] PICC Right Basilic (Active)  Indication for Insertion or Continuance of Line Prolonged intravenous therapies 08/11/2012 12:00 PM  Site Assessment Clean;Intact;Dry 08/11/2012 12:00 PM  Lumen #1 Status Infusing 08/11/2012 12:00 PM  Lumen #2 Status Saline locked;Flushed 08/11/2012 12:00 PM  Lumen #3 Status Infusing 08/11/2012 12:00 PM  Dressing Type Gauze 08/11/2012 12:00 PM  Dressing Status Clean;Dry;Intact 08/11/2012 12:00 PM  Line Care Connections checked and tightened 08/11/2012 12:00 PM  Dressing Intervention New dressing;Antimicrobial disc changed 08/06/2012 12:23 AM  Dressing Change Due 08/07/12 08/04/2012  8:00 PM     PICC Triple Lumen [id number] PICC Left Basilic (Active)       Jule Economy Horton 08/11/2012, 4:03 PM

## 2012-08-11 NOTE — Progress Notes (Signed)
PULMONARY  / CRITICAL CARE MEDICINE  Name: [name] MRN: [id number] DOB: Jan 30, 1941    ADMISSION DATE:  07/20/2012 CONSULTATION DATE:  1/18/20014   REFERRING MD :  [name]  CHIEF COMPLAINT:  Dyspnea  BRIEF PATIENT DESCRIPTION:  23 male smoker transferred from [facility] 07/20/12 with Sepsis, ARDS with PNA from H1N1/Influenza A and pneumococcus.  Significant PMHx of HTN, OSA, Hyperlipidemia, pre-DM  SIGNIFICANT EVENTS: 1/18 Transfer to [facility] from [place] for respiratory failure started on Nimbex protocol for SEVERE ARDS.  1/27 ct chest- free flowing rt effusion small, partial loculated effusion left small  1/29 diagnostic left thora, exudative  1/30 fevers continued, chest tube placement with bleeding. 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 1/27 CT head >> mild atrophy 1/27 CT chest >> atherosclerosis, centrilobular/paraseptal emphysema with subpleural blebs and few bulla, patchy ASD b/l Rt > Lt, LLL consolidation, partially loculated Lt pleural effusion 1/31 CT head >> no acute findings 2/01 EEG >> severe encephalopathy, no epileptiform activity  LINES / TUBES: ETT 1/18 >> 1/31 Trach (DF ) 1/30 >>  Lt chest tube 1/30 >> 2/5 Rt PICC 2/02 >>  CULTURES: 1/18 Blood [name]) >> pneumococcus 1/20 Respiratory viral panel >> Influenza A positive 1/26 Blood >> negative 2/02 Cath tip >> negative 2/07 Blood >> GPC in 2 BC >> 2/07 Urine >> Yeast 100K colonies 2/08 C diff >> negative  ANTIBIOTICS: 1/18 Vanc >> 1/23 1/18 Levaquin >> 1/23  1/18 Tamiflu >> 1/28  1/18 Cefepime >>1/23  1/23 Rocephin 1/23>>> 1/23  1/24 Levofloxacin >>> 1/27 1/25 Vancomycin >> 2/5 1/30 Clindamycin >> 2/4 2/08 Vancomycin  SUBJECTIVE:  Fever curve improved.  Started vancomycin overnight for GPC in blood culture.  More awake.  Tolerating some pressure support.  VITAL  SIGNS: Temp:  [99 F (37.2 C)-102.5 F (39.2 C)] 99 F (37.2 C) (02/09 0738) Pulse Rate:  [81-103] 81 (02/09 0735) Resp:  [18-40] 29 (02/09 0735) BP: (99-137)/(62-91) 99/62 mmHg (02/09 0735) SpO2:  [91 %-100 %] 100 % (02/09 0735) FiO2 (%):  [39.6 %-41 %] 40 % (02/09 0739) Weight:  [240 lb 15.4 oz (109.3 kg)] 240 lb 15.4 oz (109.3 kg) (02/09 0500) HEMODYNAMICS: CVP:  [10 mmHg-13 mmHg] 13 mmHg VENTILATOR SETTINGS: Vent Mode:  [-] CPAP;PSV FiO2 (%):  [39.6 %-41 %] 40 % Set Rate:  [18 bmp] 18 bmp PEEP:  [5 cmH20-8 cmH20] 5 cmH20 Pressure Support:  [12 cmH20] 12 cmH20 Plateau Pressure:  [21 cmH20-24 cmH20] 23 cmH20 INTAKE / OUTPUT: Intake/Output     02/08 0701 - 02/09 0700 02/09 0701 - 02/10 0700   I.V. (mL/kg) 370 (3.4)    NG/GT     IV Piggyback 900    Total Intake(mL/kg) 1270 (11.6)    Urine (mL/kg/hr) 1620 (0.6)    Other 300 (0.1)    Stool 800 (0.3)    Total Output 2720     Net -1450            PHYSICAL EXAMINATION: General:  Ill appearing Neuro:  Alert, follows simple commands HEENT:  Trach site clean Cardiovascular:  s1s2 regular Lungs: coarse breath sounds and heard b/l Abdomen:  Distention decreased, belly softer, decreased bowel sounds Musculoskeletal:  3+ edema all extremities, scrotal edema Skin:  No rashes  LABS:  Recent Labs Lab 08/05/12 0430  08/06/12 0532 08/06/12 1736  08/07/12 1100  08/09/12  0400 08/10/12 0421 08/10/12 1800 08/11/12 0504  HGB 7.7*  < >  --   --   < >  --   < > 8.1* 7.9*  --  7.7*  WBC 7.0  < >  --   --   < >  --   < > 11.6* 13.9*  --  16.7*  PLT 143*  < >  --   --   < >  --   < > 195 185  --  169  NA 147*  < >  --   --   < >  --   < > 148* 140 140 145  K 3.6  < >  --   --   < >  --   < > 3.3* 2.4* 3.4* 2.9*  CL 108  < >  --   --   < >  --   < > 109 101 103 107  CO2 36*  < >  --   --   < >  --   < > 33* 28 27 28   GLUCOSE 120*  < >  --   --   < >  --   < > 117* 248* 192* 106*  BUN 99*  < >  --   --   < >  --   < > 60* 74* 79*  84*  CREATININE 1.15  < >  --   --   < >  --   < > 1.01 1.52* 1.94* 1.95*  CALCIUM 8.7  < >  --   --   < >  --   < > 8.1* 7.4* 7.4* 7.9*  MG 2.1  --   --   --   --   --   --   --   --  1.9  --   PHOS 4.3  --   --   --   --   --   --   --   --   --   --   INR 1.14  --   --   --   --   --   --   --   --   --   --   PHART  --   --  7.409 7.428  --  7.497*  --   --   --   --   --   PCO2ART  --   --  55.8* 55.0*  --  43.6  --   --   --   --   --   PO2ART  --   --  61.7* 73.0*  --  49.7*  --   --   --   --   --   < > = values in this interval not displayed.  Recent Labs Lab 08/10/12 1532 08/10/12 1953 08/11/12 0013 08/11/12 0410 08/11/12 0728  GLUCAP 102* 102* 104* 101* 99    CXR:  Dg Chest Port 1 View  08/11/2012  *RADIOLOGY REPORT*  Clinical Data: Follow up pleural effusions  PORTABLE CHEST - 1 VIEW  Comparison: Prior chest x-ray 08/10/2012  Findings: Stable position of tracheostomy tube, the visualized portion of the nasogastric tube and the right upper extremity approach PICC.  PICC catheter tip again projects over the mid superior vena cava.  Interval development of a small left-sided pneumothorax.  Additionally, there is increasing subcutaneous air in the left lateral chest wall which now extends medially along the fibers of  the left pectoralis major muscle.  Inspiratory volumes are slightly improved with persistent but decreasing bibasilar opacities.  Similar to slightly smaller right pleural effusion. Stable cardiomegaly and mediastinal contours.  IMPRESSION:  1.  Interval development of a small left pneumothorax and increasing subcutaneous emphysema along the left lateral chest wall now tracking along the muscle fibers of the left pectoralis muscle. 2.  Slightly decreased right pleural effusion and bibasilar opacities.  These results were called by telephone on 08/11/2012 at 07:35 a.m. to the patient's nurse, Judson Roch, who verbally acknowledged these results.   Original Report Authenticated By:  Jacqulynn Cadet, M.D.    Dg Chest Port 1 View  08/10/2012  *RADIOLOGY REPORT*  Clinical Data: Follow up strap pneumonia  PORTABLE CHEST - 1 VIEW  Comparison: Prior chest x-ray 08/09/2012  Findings: Tracheostomy tube in unchanged position midline at the level of the clavicles.  Incompletely imaged nasogastric tube, the tip lies below the diaphragm likely within the stomach. Right upper extremity approach PICC with the tip projecting over the superior cavoatrial junction.  Increasing subcutaneous emphysema along the soft tissues of the left lateral chest wall of uncertain etiology. No definite left-sided pneumothorax.  Increasing bilateral interstitial and airspace opacities.  Persistent right greater than left pleural effusions.  Stable cardiomegaly.  IMPRESSION:  1.  Increasing subcutaneous emphysema in the left lateral chest wall of uncertain etiology.  No definite left pneumothorax. 2.  Increased bilateral interstitial and airspace opacities may reflect increasing pulmonary edema or progression of underlying pneumonia. 3.  Similar right larger than left pleural effusions and associated bibasilar opacities. 4.  Stable and satisfactory support apparatus   Original Report Authenticated By: Jacqulynn Cadet, M.D.    Dg Abd Portable 1v  08/11/2012  *RADIOLOGY REPORT*  Clinical Data: Abdominal distention  PORTABLE ABDOMEN - 1 VIEW  Comparison: Plain film 08/10/2012  Findings: NG tube with tip in the stomach.  There are gas-filled loops of large and small bowel which are not pathologically distended.  No evidence of gas within the rectum.  No significant change from prior.  IMPRESSION: Findings consistent with persistent partial small bowel obstruction or ileus.   Original Report Authenticated By: Suzy Bouchard, M.D.    Dg Abd Portable 1v  08/10/2012  *RADIOLOGY REPORT*  Clinical Data: Ileus  PORTABLE ABDOMEN - 1 VIEW  Comparison: CT abdomen 08/01/2012  Findings: NG tube extends to the stomach.  There are no  dilated loops of  large or small bowel. There is gas throughout the small bowel and colon. No gas in the rectum.  IMPRESSION: NG tube in stomach.  No clear evidence of ileus or obstruction.   Original Report Authenticated By: Suzy Bouchard, M.D.      ASSESSMENT / PLAN:  PULMONARY A: Acute respiratory failure with ARDS 2nd to H1N1/Influenza A and pneumococcal PNA. Hx of smoking with emphysema on CT chest. Failure to wean from ventilator s/p tracheostomy. Lt exudative pleural effusion likely from empyema. ?Small Lt PTX on CXR 2/8. P:   Pressure support wean as tolerated  Goal negative fluid balance BD's PRN F/u CT chest 2/8 to better assess Lt pleural space Lasix 40 mg IV x 1 2/9  CARDIOVASCULAR A: Septic shock from PNA >> resolved Demand ischemia with NSTEMI. 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 Hx of HTN, Hyperlipidemia. P:  Continue ASA  RENAL A:  Acute renal failure. Hypokalemia. Hypernatremia - resolved P:   F/u renal fx, urine outpt F/u and replace electrolytes as needed  GASTROINTESTINAL A:  Nutrition. Ileus developed 2/8. Some improvement 2/9. P:   Hold tube feeds NG tube to suction Correct electrolytes Pepcid for SUP  HEMATOLOGIC A:  Anemia of critical illness. Thrombocytopenia >> resolved P:  F/u CBC SQ heparin for DVT prophylaxis  INFECTIOUS A:  PNA/bacteremia from influenza and pneumococcus. Lt Empyema. Fever >> GPC in blood culture from 2/7. P:   Vancomycin resumed 2/8 Antibiotics only if otherwise unstable  ENDOCRINE A:  DM with hyperglycemia. Relative adrenal insufficiency>> resolved P:   SSI D/c lantus while NPO  NEUROLOGIC A:  Acute encephalopathy. Much improved 2/9. 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 P:   Neurology signed off >> may need to reconsult once off sedating meds for ?continuation of dilatin Continue dilantin for now PRN versed while on vent Avoid opiates with ileus  DERMATOLOGIC A: Drug rash 2nd to  cephalosporins >> improving. P: Monitor    CC time 35 minutes  Chesley Mires, MD [place] 08/11/2012, 7:48 AM Pager:  [phone] After 3pm call: [phone]

## 2012-08-11 NOTE — Progress Notes (Signed)
eLink Physician-Brief Progress Note Patient Name: [name] DOB: 09/01/1940 MRN: [id number]  Date of Service  08/11/2012   HPI/Events of Note  Hypokalemia  eICU Interventions  Potassium replaced   Intervention Category Intermediate Interventions: Electrolyte abnormality - evaluation and management Minor Interventions: Electrolytes abnormality - evaluation and management  [name] 08/11/2012, 6:32 AM

## 2012-08-12 LAB — BASIC METABOLIC PANEL
CO2: 25 mEq/L (ref 19–32)
Calcium: 8.2 mg/dL — ABNORMAL LOW (ref 8.4–10.5)
Creatinine, Ser: 1.5 mg/dL — ABNORMAL HIGH (ref 0.50–1.35)
GFR calc non Af Amer: 45 mL/min — ABNORMAL LOW (ref >90)
Glucose, Bld: 95 mg/dL (ref 70–99)
Sodium: 151 mEq/L — ABNORMAL HIGH (ref 135–145)

## 2012-08-12 LAB — CBC
MCH: 30.4 pg (ref 26.0–34.0)
MCHC: 32 g/dL (ref 30.0–36.0)
MCV: 95.1 fL (ref 78.0–100.0)
Platelets: 173 10*3/uL (ref 150–400)
RBC: 2.63 MIL/uL — ABNORMAL LOW (ref 4.22–5.81)
RDW: 18.6 % — ABNORMAL HIGH (ref 11.5–15.5)

## 2012-08-12 LAB — CULTURE, BLOOD (ROUTINE X 2)

## 2012-08-12 LAB — GLUCOSE, CAPILLARY
Glucose-Capillary: 95 mg/dL (ref 70–99)
Glucose-Capillary: 109 mg/dL — ABNORMAL HIGH (ref 70–99)

## 2012-08-12 LAB — MAGNESIUM: Magnesium: 2.3 mg/dL (ref 1.5–2.5)

## 2012-08-12 MED ORDER — VANCOMYCIN HCL 10 G IV SOLR
1250.0000 mg | INTRAVENOUS | Status: DC
  Filled 2012-08-12: qty 1250

## 2012-08-12 MED ORDER — ASPIRIN 81 MG PO CHEW: 81.0000 mg | CHEWABLE_TABLET | Freq: Every day | ORAL | Status: DC

## 2012-08-12 MED ORDER — FENTANYL CITRATE 0.05 MG/ML IJ SOLN: 12.5000 ug | INTRAMUSCULAR | Status: DC | PRN

## 2012-08-12 MED ORDER — DEXTROSE 5 % IV SOLN
INTRAVENOUS | Status: DC
  Administered 2012-08-12: 50 mL via INTRAVENOUS

## 2012-08-12 MED ORDER — INSULIN ASPART 100 UNIT/ML ~~LOC~~ SOLN: 0.0000 [IU] | SUBCUTANEOUS | Status: DC

## 2012-08-12 MED ORDER — SODIUM CHLORIDE 0.9 % IV SOLN: 125.0000 mg | Freq: Three times a day (TID) | INTRAVENOUS | Status: DC

## 2012-08-12 MED ORDER — MIDAZOLAM HCL 2 MG/2ML IJ SOLN: 1.0000 mg | INTRAMUSCULAR | Status: DC | PRN

## 2012-08-12 MED ORDER — VANCOMYCIN HCL IN DEXTROSE 1-5 GM/200ML-% IV SOLN
1000.0000 mg | Freq: Two times a day (BID) | INTRAVENOUS | Status: DC
  Administered 2012-08-12: 1000 mg via INTRAVENOUS
  Filled 2012-08-12 (×2): qty 200

## 2012-08-12 MED ORDER — VANCOMYCIN HCL 10 G IV SOLR: 1250.0000 mg | INTRAVENOUS | Status: DC

## 2012-08-12 MED ORDER — OXEPA PO LIQD: 1000.0000 mL | ORAL | Status: DC

## 2012-08-12 NOTE — Discharge Summary (Signed)
Physician Discharge Summary  Patient ID: [name] MRN: [id number] DOB/AGE: 03-14-1941 72 y.o.  Admit date: 07/20/2012 Discharge date: 08/12/2012  Admission Diagnoses: Sepsis ARDS PNA from H1N1/influenza A and pneumococcus  Discharge Diagnoses:  Active Problems:   Acute respiratory failure with hypoxia   ARDS (adult respiratory distress syndrome)   Acute renal failure   Other convulsions   Abnormal involuntary movement   Discharged Condition: stable  Hospital Course: 72 yo male with known hx of HTN, CHF presented to [facility] ER on 1/18 w/ 1 week hx of cough , congestion w/ progressively worsening dyspnea . Hypoxia in ER , decompensated requiring intubation . CXR w/ multilobar PNA . Hypotension unresponsive to fluid challenged requiring pressors. Transferred to [facility] for evaluation and management.  Pulmonary: Acute hypoxic respiratory failure requiring intubation on admission due to multilobar pneumonia.  Found to be H1N1 and influenza A positive as well as pneumonoccal pneumonia.  Completed course of tamiflu and antibiotics.  CT scan on 1/27 showed loculated left effusion.  Chest tube was placed 1/30 due to continued fevers.  Trached on 1/30.  Failed multiple wean attempts.  CV:  Resolved septic shock.  NSTEMI secondary to demand ischemia, treated medically with ASA.  Home hypertension medications help secondary to low to normal BPs.  Renal: Acute renal failure in setting of septic shock.  Renal function improved with treatment of sepsis; did not require HD.    Gastrointestinal: Started on tube feeds while on ventilator.  Tolerated well.  Developed ileus on 2/8, which is improving at discharge.  Received pepcid for ulcer prophylaxis.   Hematologic: Anemia, stable with hemoglobin around 8.   ID: Initially admitted with influenza and pneumococcal pneumonia, found to have left empyema.  Treated with tamiflu x10 days, broad antibiotic coverage with Vanc, Levoquin, cefepime, and  clinda.  Antibiotics were stopped after a 12 day course.  He had continued fevers and repeat blood cultures showed coag neg staph likely from a line infection and he was restarted on Vancomycin.   Endocrine: Diabetic, on lantus and SSI.  Relative adrenal insufficiency on stress dose steroids, now resolved and off steroids.  Neurologic: Possible myoclonus noted, currently stable on dilantin with no further myoclonic activity.  Improving encephalopathy.   Dermatologic: Developed diffuse rash after exposure to cephalosporins; now resolved.  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 rate 38, height 5\' 7"  (1.702 m), weight 236 lb 15.9 oz (107.5 kg), SpO2 95.00%. General: Ill appearing  Neuro: Alert, follows simple commands  HEENT: Trach site clean  Cardiovascular: s1s2 regular  Lungs: coarse breath sounds heard b/l  Abdomen: Distention decreased, belly softer, decreased bowel sounds  Musculoskeletal: 2+ edema all extremities, scrotal edema  Skin: No rashes   Disposition: transfer to [facility]  Follow up issues 1. Rec repeat CXR in morning to evaluated previous chest tube tract 2. Add free water flushes for hypernatremia     Medication List  STOP taking these medications       amLODipine 5 MG tablet  Commonly known as:  NORVASC     doxazosin 8 MG tablet  Commonly known as:   CARDURA     hydrochlorothiazide 25 MG tablet  Commonly known as:  HYDRODIURIL     ibuprofen 200 MG tablet  Commonly known as:  ADVIL,MOTRIN     losartan 100 MG tablet  Commonly known as:  COZAAR     metoprolol succinate 50 MG 24 hr tablet  Commonly known as:  TOPROL-XL     niacin 500 MG CR tablet  Commonly known as:  NIASPAN     pantoprazole 40 MG tablet  Commonly known as:  PROTONIX     potassium chloride SA 20 MEQ tablet  Commonly known as:  K-DUR,KLOR-CON     spironolactone 25 MG tablet  Commonly known as:  ALDACTONE      TAKE these medications       aspirin 81 MG chewable tablet  Place 1 tablet (81 mg total) into feeding tube daily.     atorvastatin 20 MG tablet  Commonly known as:  LIPITOR  Take 20 mg by mouth daily.     feeding supplement (OXEPA) Liqd  Place 1,000 mLs into feeding tube daily.     fentaNYL 0.05 MG/ML injection  Commonly known as:  SUBLIMAZE  Inject 0.25-0.5 mLs (12.5-25 mcg total) into the vein every 4 (four) hours as needed for severe pain.     insulin aspart 100 UNIT/ML injection  Commonly known as:  novoLOG  Inject 0-20 Units into the skin every 4 (four) hours.     midazolam 2 MG/2ML Soln injection  Commonly known as:  VERSED  Inject 1-2 mLs (1-2 mg total) into the vein every 2 (two) hours as needed (to maintaindesired RASS goal.).     sodium chloride 0.9 % SOLN 100 mL with phenytoin 50 MG/ML SOLN 125 mg  Inject 125 mg into the vein every 8 (eight) hours.     sodium chloride 0.9 % SOLN 250 mL with vancomycin 10 G SOLR 1,250 mg  Inject 1,250 mg into the vein daily.  Start taking on:  08/13/2012         Signed: BOOTH, ERIN 08/12/2012, 5:32 PM  Starting trach collar trials Monitor chest tube site, may need surgical follow up  Lavon Paganini. Titus Mould, MD, [place] Pgr: [facility]

## 2012-08-12 NOTE — Progress Notes (Signed)
Trach Team Pt continues to need pressure support. Not yet appropriate for PMSV. SLP will continue to follow via chart. [name], [place] CCC-SLP [phone]

## 2012-08-12 NOTE — Progress Notes (Signed)
Patient taken off ventilator and placed on aerosol trach collar to wean this morning.  MD aware.  RT will continue to monitor.

## 2012-08-12 NOTE — Progress Notes (Signed)
Patient transported via [place] to [place]. Patient's wife was present until time of transfer.

## 2012-08-12 NOTE — Progress Notes (Signed)
Left chest tube site assessed.  Sutures are loose without good closure of previous chest tube wound.  Appearance like patient had been edematous and now is not with give in sutures.  Old sutures removed.  Site numbed with 1% lidocaine.  Sutures placed for skin / tissue closure.  Vaseline gauze applied to site with tegaderm for closure.     Noe Gens, NP-C [facility] Pgr: [phone] or [phone]

## 2012-08-12 NOTE — Progress Notes (Addendum)
Patient ID: [name], male   DOB: Aug 17, 1940, 72 y.o.   MRN: [id number]   Still awaiting eventual G tube placement Tmax 100.4 this am afeb now Wbc 14.2 (16.7) +BC  Will recheck Temp and labs in am Check [facility]   Will attempt to prepare for later in week

## 2012-08-12 NOTE — Progress Notes (Signed)
Patient ID: [name], male   DOB: Sep 09, 1940, 72 y.o.   MRN: [id number]         [facility]    Date of Admission:  07/20/2012           Day 3 vancomycin  Active Problems:   Acute respiratory failure with hypoxia   ARDS (adult respiratory distress syndrome)   Acute renal failure   Other convulsions   Abnormal involuntary movement   . antiseptic oral rinse  15 mL Mouth Rinse QID  . aspirin  81 mg Per Tube Daily  . chlorhexidine  15 mL Mouth Rinse BID  . famotidine (PEPCID) IV  20 mg Intravenous Q12H  . feeding supplement (OXEPA)  1,000 mL Per Tube Q24H  . insulin aspart  0-20 Units Subcutaneous Q4H  . phenytoin (DILANTIN) IV  125 mg Intravenous Q8H  . sodium chloride  10-40 mL Intracatheter Q12H  . sodium chloride  10-40 mL Intracatheter Q12H  . vancomycin  1,000 mg Intravenous Q12H   Objective: Temp:  [98.1 F (36.7 C)-100.4 F (38 C)] 98.1 F (36.7 C) (02/10 1219) Pulse Rate:  [90-104] 104 (02/10 1300) Resp:  [20-40] 35 (02/10 1300) BP: (104-143)/(52-97) 130/81 mmHg (02/10 1300) SpO2:  [90 %-100 %] 94 % (02/10 1300) FiO2 (%):  [40 %-60 %] 60 % (02/10 1300) Weight:  [107.5 kg (236 lb 15.9 oz)] 107.5 kg (236 lb 15.9 oz) (02/10 0500)  General: More alert. Appears to nod appropriately to questions Skin: Old right arm PICC removed Lungs: Few crackles Cor: Regular S1-S2 no murmurs Abdomen: Obese, soft and nontender  Lab Results Lab Results  Component Value Date   WBC 14.2* 08/12/2012   HGB 8.0* 08/12/2012   HCT 25.0* 08/12/2012   MCV 95.1 08/12/2012   PLT 173 08/12/2012    Lab Results  Component Value Date   CREATININE 1.50* 08/12/2012   BUN 70* 08/12/2012   NA 151* 08/12/2012   K 3.3* 08/12/2012   CL 114* 08/12/2012   CO2 25 08/12/2012    Lab Results  Component Value Date   ALT 56* 07/31/2012   AST 42* 07/31/2012   ALKPHOS 59 07/31/2012   BILITOT 0.4 07/31/2012      Microbiology: Recent Results (from the past 240 hour(s))  CATH  TIP CULTURE     Status: None   Collection Time    08/04/12  4:28 PM      Result Value Range Status   Specimen Description CATH TIP CENTRAL LINE   Final   Special Requests NONE   Final   Culture NO GROWTH 2 DAYS   Final   Report Status 08/07/2012 FINAL   Final  CULTURE, BLOOD (ROUTINE X 2)     Status: None   Collection Time    08/09/12  8:08 AM      Result Value Range Status   Specimen Description BLOOD LEFT HAND   Final   Special Requests BOTTLES DRAWN AEROBIC ONLY 10CC   Final   Culture  Setup Time 08/09/2012 12:55   Final   Culture     Final   Value: STAPHYLOCOCCUS SPECIES (COAGULASE NEGATIVE)     Note: RIFAMPIN AND GENTAMICIN SHOULD NOT BE USED AS SINGLE DRUGS FOR TREATMENT OF STAPH INFECTIONS.     Note: Gram Stain Report Called to,Read Back By and Verified With: SARA GROCE 08/10/12 @ 2:54PM BY RUSCA.   Report Status 08/12/2012 FINAL   Final   Organism ID, Bacteria STAPHYLOCOCCUS SPECIES (  COAGULASE NEGATIVE)   Final  CULTURE, BLOOD (ROUTINE X 2)     Status: None   Collection Time    08/09/12  8:12 AM      Result Value Range Status   Specimen Description BLOOD LEFT HAND   Final   Special Requests BOTTLES DRAWN AEROBIC ONLY 10CC   Final   Culture  Setup Time 08/09/2012 12:55   Final   Culture     Final   Value: STAPHYLOCOCCUS SPECIES (COAGULASE NEGATIVE)     Note: SUSCEPTIBILITIES PERFORMED ON PREVIOUS CULTURE WITHIN THE LAST 5 DAYS.     Note: Gram Stain Report Called to,Read Back By and Verified With: SARA GROCE 08/10/12 @ 8:20PM BY RUSCA.   Report Status 08/12/2012 FINAL   Final  URINE CULTURE     Status: None   Collection Time    08/09/12  9:30 AM      Result Value Range Status   Specimen Description URINE, CATHETERIZED   Final   Special Requests Normal   Final   Culture  Setup Time 08/09/2012 10:12   Final   Colony Count >=100,000 COLONIES/ML   Final   Culture YEAST   Final   Report Status 08/10/2012 FINAL   Final  CLOSTRIDIUM DIFFICILE BY PCR     Status: None    Collection Time    08/09/12 10:54 AM      Result Value Range Status   C difficile by pcr NEGATIVE  NEGATIVE Final  CLOSTRIDIUM DIFFICILE BY PCR     Status: None   Collection Time    08/10/12 11:37 AM      Result Value Range Status   C difficile by pcr NEGATIVE  NEGATIVE Final    Studies/Results: Ct Chest Wo Contrast  08/11/2012  *RADIOLOGY REPORT*  Clinical Data: Pneumonia, small left pneumothorax.  Prior left chest tube.  Subcutaneous emphysema  CT CHEST WITHOUT CONTRAST  Technique:  Multidetector CT imaging of the chest was performed following the standard protocol without IV contrast.  Comparison: Chest radiograph 08/11/2012, CT 08/01/2012  Findings: The tracheostomy tube in good position.  NG tube extends the stomach.  There is a small right pneumothorax again demonstrated.  There is a gas filled track along the previous course of the left chest tube with a communication between the pleural space and the subcutaneous space with a small amount of tissue herniating through this gas filled tract.  The gas escapes through this tract and extends through the subcutaneous tissues along the anterior left chest wall.  There is dense bibasilar atelectasis with air bronchograms versus pneumonia.  There are bilateral pleural effusions.  There is paraseptal emphysema in the upper lobes.  Heart appears normal.  No pneumomediastinum.  Review of the upper abdomen demonstrates the feeding tube extending to the duodenum.  Small low density lesion within the posterior liver likely represents a cyst.  There is no aggressive osseous lesions.  IMPRESSION:  1.  Communication between the pleural space and the subcutaneous left chest wall through the previous chest tube track.  This is likely exacerbated by the positive pressure from the tracheostomy. 2.  Small right apical pneumothorax. 3.  Moderate amount of subcutaneous gas along the left chest wall. 4.  Dense bibasilar atelectasis and likely pneumonia. 5.  Bilateral  pleural effusions.  Findings discussed Dr. Halford Chessman on the 08/11/2012 and 08/16/2023   Original Report Authenticated By: Suzy Bouchard, M.D.    Dg Chest Port 1 View  08/11/2012  *RADIOLOGY REPORT*  Clinical  Data: Follow up pleural effusions  PORTABLE CHEST - 1 VIEW  Comparison: Prior chest x-ray 08/10/2012  Findings: Stable position of tracheostomy tube, the visualized portion of the nasogastric tube and the right upper extremity approach PICC.  PICC catheter tip again projects over the mid superior vena cava.  Interval development of a small left-sided pneumothorax.  Additionally, there is increasing subcutaneous air in the left lateral chest wall which now extends medially along the fibers of the left pectoralis major muscle.  Inspiratory volumes are slightly improved with persistent but decreasing bibasilar opacities.  Similar to slightly smaller right pleural effusion. Stable cardiomegaly and mediastinal contours.  IMPRESSION:  1.  Interval development of a small left pneumothorax and increasing subcutaneous emphysema along the left lateral chest wall now tracking along the muscle fibers of the left pectoralis muscle. 2.  Slightly decreased right pleural effusion and bibasilar opacities.  These results were called by telephone on 08/11/2012 at 07:35 a.m. to the patient's nurse, Judson Roch, who verbally acknowledged these results.   Original Report Authenticated By: Jacqulynn Cadet, M.D.    Dg Abd Portable 1v  08/11/2012  *RADIOLOGY REPORT*  Clinical Data: Abdominal distention  PORTABLE ABDOMEN - 1 VIEW  Comparison: Plain film 08/10/2012  Findings: NG tube with tip in the stomach.  There are gas-filled loops of large and small bowel which are not pathologically distended.  No evidence of gas within the rectum.  No significant change from prior.  IMPRESSION: Findings consistent with persistent partial small bowel obstruction or ileus.   Original Report Authenticated By: Suzy Bouchard, M.D.     Assessment: His  recent fevers are due to methicillin-resistant coagulase-negative staph bacteremia which may have been due to a central line infection.  Plan: 1. Continue vancomycin  Michel Bickers, MD [facility] [phone] pager   [phone] cell 08/12/2012, 2:30 PM

## 2012-08-12 NOTE — Progress Notes (Signed)
Meth resistant staph coag neg   Primary team aware, ID following   Repeat BC today. PIC now in place, date of placement 08/11/12  No further change in therapy/

## 2012-08-15 NOTE — Procedures (Signed)
Dorian Pod, MD 08/05/2012 11:18 AM EEG report.  Brief clinical history: 72 years old male with multiple medical  problems and recent development of paroxysmal events concerning  for seizures. No prior history of frank epileptic seizures.  Technique: this is a 17 channel routine scalp EEG performed in  the ICU setting, with bipolar and monopolar montages arranged in  accordance to the international 10/20 system of electrode  placement. One channel was dedicated to EKG recording.  Patient is intubated and mechanically ventilated. As per protocol, no activating procedure employed during the test.  Description: As the study begins, there is generalized,  continuous, monomorphic, no reactive 4-5 Hz activity that doesn't  follow an ictal pattern at any time and is not associated with  focal or generalized epileptiform discharges.  EKG showed sinus rhythm.  Impression: this is an abnormal EEG with findings consistent with  a severe encephalopathy, non specific as to cause. In general, this study is unchanged in comparison to previous EEG dated 08/03/12.  Please, be  aware that the absence of epileptiform discharges does not  exclude the possibility of epilepsy.  Clinical correlation is advised.  Dorian Pod, MD

## 2012-08-15 NOTE — Procedures (Signed)
EEG report.  Brief clinical history: 72 years old male with multiple medical  problems and recent development of paroxysmal events concerning  for seizures. No prior history of frank epileptic seizures.  Technique: this is a 17 channel routine scalp EEG performed in  the ICU setting, with bipolar and monopolar montages arranged in  accordance to the international 10/20 system of electrode  placement. One channel was dedicated to EKG recording.  Patient is intubated and mechanically ventilated. No activating procedure employed during the test.  Description: As the study begins, there is generalized,  continuous, monomorphic, no reactive 4-5 Hz activity that doesn't  follow an ictal pattern at any time and is not associated with  focal or generalized epileptiform discharges..  EKG showed sinus rhythm.  Impression: this is an abnormal EEG with findings consistent with  a severe encephalopathy, non specific as to cause. Overall, this EEG is unchanged in comparison to previous study dated 08/03/12.  Please, be  aware that the absence of epileptiform discharges does not  exclude the possibility of epilepsy.  Clinical correlation is advised.  Dorian Pod, MD

## 2012-08-15 NOTE — Procedures (Signed)
  EEG report.  Brief clinical history: 72 years old male with multiple medical  problems and recent development of paroxysmal events concerning  for seizures. No prior history of frank epileptic seizures.  Technique: this is a 17 channel routine scalp EEG performed in  the ICU setting, with bipolar and monopolar montages arranged in  accordance to the international 10/20 system of electrode  placement. One channel was dedicated to EKG recording.  Patient is intubated and mechanically ventilated. No activating procedure employed during the test.  Description: As the study begins, there is generalized,  continuous, monomorphic, no reactive 4-5 Hz activity that doesn't  follow an ictal pattern at any time and is not associated with  focal or generalized epileptiform discharges..  EKG showed sinus rhythm.  Impression: this is an abnormal EEG with findings consistent with  a severe encephalopathy, non specific as to cause. Overall, this EEG is unchanged in comparison to previous study dated 08/03/12. Please, be  aware that the absence of epileptiform discharges does not  exclude the possibility of epilepsy.  Clinical correlation is advised.

## 2012-08-18 LAB — CULTURE, BLOOD (ROUTINE X 2)

## 2012-08-18 LAB — CULTURE, BLOOD (SINGLE): Culture: NO GROWTH

## 2012-08-19 LAB — CULTURE, BLOOD (ROUTINE X 2): Culture: NO GROWTH

## 2012-08-31 HISTORY — PX: TRACHEOSTOMY CLOSURE: SHX458

## 2012-08-31 DEATH — deceased

## 2012-10-15 HISTORY — PX: TRANSURETHRAL MICROWAVE THERAPY: SUR1394

## 2012-10-17 ENCOUNTER — Encounter (HOSPITAL_COMMUNITY): Payer: Self-pay | Admitting: General Practice

## 2012-10-17 ENCOUNTER — Inpatient Hospital Stay (HOSPITAL_COMMUNITY)
Admission: AD | Admit: 2012-10-17 | Discharge: 2012-11-04 | DRG: 545 | Disposition: A | Discharge status: Skilled Nursing Facility | Payer: BC Managed Care – PPO | Source: Other Acute Inpatient Hospital | Attending: Thoracic Surgery (Cardiothoracic Vascular Surgery) | Admitting: Thoracic Surgery (Cardiothoracic Vascular Surgery)

## 2012-10-17 DIAGNOSIS — E785 Hyperlipidemia, unspecified: Secondary | ICD-10-CM | POA: Diagnosis present

## 2012-10-17 DIAGNOSIS — E876 Hypokalemia: Secondary | ICD-10-CM | POA: Diagnosis present

## 2012-10-17 DIAGNOSIS — Z7982 Long term (current) use of aspirin: Secondary | ICD-10-CM

## 2012-10-17 DIAGNOSIS — T17308A Unspecified foreign body in larynx causing other injury, initial encounter: Secondary | ICD-10-CM | POA: Diagnosis not present

## 2012-10-17 DIAGNOSIS — I2589 Other forms of chronic ischemic heart disease: Secondary | ICD-10-CM | POA: Diagnosis present

## 2012-10-17 DIAGNOSIS — E861 Hypovolemia: Secondary | ICD-10-CM | POA: Diagnosis not present

## 2012-10-17 DIAGNOSIS — D638 Anemia in other chronic diseases classified elsewhere: Secondary | ICD-10-CM | POA: Diagnosis not present

## 2012-10-17 DIAGNOSIS — I2510 Atherosclerotic heart disease of native coronary artery without angina pectoris: Secondary | ICD-10-CM | POA: Diagnosis present

## 2012-10-17 DIAGNOSIS — Z01811 Encounter for preprocedural respiratory examination: Secondary | ICD-10-CM

## 2012-10-17 DIAGNOSIS — I723 Aneurysm of iliac artery: Secondary | ICD-10-CM | POA: Diagnosis present

## 2012-10-17 DIAGNOSIS — IMO0002 Reserved for concepts with insufficient information to code with codable children: Secondary | ICD-10-CM | POA: Diagnosis not present

## 2012-10-17 DIAGNOSIS — J95812 Postprocedural air leak: Secondary | ICD-10-CM | POA: Diagnosis not present

## 2012-10-17 DIAGNOSIS — J4489 Other specified chronic obstructive pulmonary disease: Secondary | ICD-10-CM | POA: Diagnosis present

## 2012-10-17 DIAGNOSIS — Z794 Long term (current) use of insulin: Secondary | ICD-10-CM

## 2012-10-17 DIAGNOSIS — E870 Hyperosmolality and hypernatremia: Secondary | ICD-10-CM | POA: Diagnosis present

## 2012-10-17 DIAGNOSIS — Z87891 Personal history of nicotine dependence: Secondary | ICD-10-CM

## 2012-10-17 DIAGNOSIS — D696 Thrombocytopenia, unspecified: Secondary | ICD-10-CM | POA: Diagnosis not present

## 2012-10-17 DIAGNOSIS — R7309 Other abnormal glucose: Secondary | ICD-10-CM

## 2012-10-17 DIAGNOSIS — R7303 Prediabetes: Secondary | ICD-10-CM

## 2012-10-17 DIAGNOSIS — J80 Acute respiratory distress syndrome: Secondary | ICD-10-CM

## 2012-10-17 DIAGNOSIS — G4733 Obstructive sleep apnea (adult) (pediatric): Secondary | ICD-10-CM | POA: Diagnosis present

## 2012-10-17 DIAGNOSIS — J95821 Acute postprocedural respiratory failure: Secondary | ICD-10-CM | POA: Diagnosis not present

## 2012-10-17 DIAGNOSIS — I714 Abdominal aortic aneurysm, without rupture, unspecified: Secondary | ICD-10-CM | POA: Diagnosis present

## 2012-10-17 DIAGNOSIS — I350 Nonrheumatic aortic (valve) stenosis: Secondary | ICD-10-CM | POA: Diagnosis present

## 2012-10-17 DIAGNOSIS — R339 Retention of urine, unspecified: Secondary | ICD-10-CM | POA: Diagnosis not present

## 2012-10-17 DIAGNOSIS — I255 Ischemic cardiomyopathy: Secondary | ICD-10-CM

## 2012-10-17 DIAGNOSIS — D62 Acute posthemorrhagic anemia: Secondary | ICD-10-CM | POA: Diagnosis not present

## 2012-10-17 DIAGNOSIS — E2749 Other adrenocortical insufficiency: Secondary | ICD-10-CM | POA: Diagnosis not present

## 2012-10-17 DIAGNOSIS — J9819 Other pulmonary collapse: Secondary | ICD-10-CM | POA: Diagnosis not present

## 2012-10-17 DIAGNOSIS — Z951 Presence of aortocoronary bypass graft: Secondary | ICD-10-CM

## 2012-10-17 DIAGNOSIS — J449 Chronic obstructive pulmonary disease, unspecified: Secondary | ICD-10-CM

## 2012-10-17 DIAGNOSIS — Z79899 Other long term (current) drug therapy: Secondary | ICD-10-CM

## 2012-10-17 DIAGNOSIS — R5381 Other malaise: Secondary | ICD-10-CM | POA: Diagnosis not present

## 2012-10-17 DIAGNOSIS — I359 Nonrheumatic aortic valve disorder, unspecified: Secondary | ICD-10-CM

## 2012-10-17 DIAGNOSIS — R0602 Shortness of breath: Secondary | ICD-10-CM

## 2012-10-17 DIAGNOSIS — I10 Essential (primary) hypertension: Secondary | ICD-10-CM | POA: Diagnosis present

## 2012-10-17 DIAGNOSIS — J8410 Pulmonary fibrosis, unspecified: Secondary | ICD-10-CM | POA: Diagnosis present

## 2012-10-17 DIAGNOSIS — E872 Acidosis, unspecified: Secondary | ICD-10-CM | POA: Diagnosis not present

## 2012-10-17 DIAGNOSIS — C61 Malignant neoplasm of prostate: Secondary | ICD-10-CM | POA: Diagnosis present

## 2012-10-17 DIAGNOSIS — I509 Heart failure, unspecified: Secondary | ICD-10-CM | POA: Diagnosis present

## 2012-10-17 DIAGNOSIS — Z952 Presence of prosthetic heart valve: Secondary | ICD-10-CM

## 2012-10-17 DIAGNOSIS — F411 Generalized anxiety disorder: Secondary | ICD-10-CM | POA: Diagnosis present

## 2012-10-17 DIAGNOSIS — I5021 Acute systolic (congestive) heart failure: Secondary | ICD-10-CM

## 2012-10-17 DIAGNOSIS — J9601 Acute respiratory failure with hypoxia: Secondary | ICD-10-CM

## 2012-10-17 HISTORY — DX: Shortness of breath: R06.02

## 2012-10-17 HISTORY — DX: Anxiety disorder, unspecified: F41.9

## 2012-10-17 HISTORY — DX: Personal history of other medical treatment: Z92.89

## 2012-10-17 HISTORY — DX: Pneumonia, unspecified organism: J18.9

## 2012-10-17 HISTORY — DX: Heart failure, unspecified: I50.9

## 2012-10-17 HISTORY — DX: Chronic obstructive pulmonary disease, unspecified: J44.9

## 2012-10-17 HISTORY — DX: Type 2 diabetes mellitus without complications: E11.9

## 2012-10-17 LAB — GLUCOSE, CAPILLARY: Glucose-Capillary: 139 mg/dL — ABNORMAL HIGH (ref 70–99)

## 2012-10-17 LAB — MRSA PCR SCREENING: MRSA by PCR: NEGATIVE

## 2012-10-17 LAB — TROPONIN I: Troponin I: <0.3 ng/mL (ref <0.30)

## 2012-10-17 MED ORDER — SODIUM CHLORIDE 0.9 % IJ SOLN: 3.0000 mL | INTRAMUSCULAR | Status: DC | PRN

## 2012-10-17 MED ORDER — SODIUM CHLORIDE 0.9 % IJ SOLN
3.0000 mL | Freq: Two times a day (BID) | INTRAMUSCULAR | Status: DC
  Administered 2012-10-17 – 2012-10-24 (×14): 3 mL via INTRAVENOUS

## 2012-10-17 MED ORDER — INSULIN ASPART 100 UNIT/ML ~~LOC~~ SOLN
0.0000 [IU] | Freq: Three times a day (TID) | SUBCUTANEOUS | Status: DC
  Administered 2012-10-18: 2 [IU] via SUBCUTANEOUS
  Administered 2012-10-19: 3 [IU] via SUBCUTANEOUS
  Administered 2012-10-19 – 2012-10-20 (×2): 2 [IU] via SUBCUTANEOUS
  Administered 2012-10-20: 3 [IU] via SUBCUTANEOUS
  Administered 2012-10-21: 2 [IU] via SUBCUTANEOUS
  Administered 2012-10-21 – 2012-10-22 (×2): 3 [IU] via SUBCUTANEOUS
  Administered 2012-10-23: 8 [IU] via SUBCUTANEOUS
  Administered 2012-10-24: 2 [IU] via SUBCUTANEOUS

## 2012-10-17 MED ORDER — METOPROLOL TARTRATE 12.5 MG HALF TABLET
12.5000 mg | ORAL_TABLET | Freq: Two times a day (BID) | ORAL | Status: DC
  Administered 2012-10-17 – 2012-10-19 (×4): 12.5 mg via ORAL
  Filled 2012-10-17 (×5): qty 1

## 2012-10-17 MED ORDER — DEXAMETHASONE 4 MG PO TABS
4.0000 mg | ORAL_TABLET | Freq: Every day | ORAL | Status: DC
  Administered 2012-10-18 – 2012-10-23 (×6): 4 mg via ORAL
  Filled 2012-10-17 (×7): qty 1

## 2012-10-17 MED ORDER — ATORVASTATIN CALCIUM 20 MG PO TABS
20.0000 mg | ORAL_TABLET | Freq: Every day | ORAL | Status: DC
  Administered 2012-10-17 – 2012-10-22 (×6): 20 mg via ORAL
  Filled 2012-10-17 (×6): qty 1

## 2012-10-17 MED ORDER — FUROSEMIDE 10 MG/ML IJ SOLN
40.0000 mg | Freq: Three times a day (TID) | INTRAMUSCULAR | Status: DC
  Administered 2012-10-17 – 2012-10-18 (×2): 40 mg via INTRAVENOUS
  Filled 2012-10-17 (×5): qty 4

## 2012-10-17 MED ORDER — LISINOPRIL 2.5 MG PO TABS
2.5000 mg | ORAL_TABLET | Freq: Every day | ORAL | Status: DC
  Administered 2012-10-17 – 2012-10-20 (×4): 2.5 mg via ORAL
  Filled 2012-10-17 (×6): qty 1

## 2012-10-17 MED ORDER — ACETAMINOPHEN 325 MG PO TABS: 650.0000 mg | ORAL_TABLET | Freq: Three times a day (TID) | ORAL | Status: DC | PRN

## 2012-10-17 MED ORDER — TRAZODONE 25 MG HALF TABLET
25.0000 mg | ORAL_TABLET | Freq: Every day | ORAL | Status: DC
  Administered 2012-10-17 – 2012-11-03 (×17): 25 mg via ORAL
  Filled 2012-10-17 (×19): qty 1

## 2012-10-17 MED ORDER — DUTASTERIDE 0.5 MG PO CAPS
0.5000 mg | ORAL_CAPSULE | Freq: Every evening | ORAL | Status: DC
  Administered 2012-10-17 – 2012-11-03 (×15): 0.5 mg via ORAL
  Filled 2012-10-17 (×19): qty 1

## 2012-10-17 MED ORDER — ALBUTEROL SULFATE (5 MG/ML) 0.5% IN NEBU
2.5000 mg | INHALATION_SOLUTION | Freq: Four times a day (QID) | RESPIRATORY_TRACT | Status: DC
  Administered 2012-10-18 – 2012-10-20 (×13): 2.5 mg via RESPIRATORY_TRACT
  Filled 2012-10-17 (×12): qty 0.5

## 2012-10-17 MED ORDER — POTASSIUM CHLORIDE CRYS ER 20 MEQ PO TBCR
40.0000 meq | EXTENDED_RELEASE_TABLET | Freq: Once | ORAL | Status: AC
  Administered 2012-10-17: 40 meq via ORAL
  Filled 2012-10-17: qty 2

## 2012-10-17 MED ORDER — LACTULOSE 10 GM/15ML PO SOLN
20.0000 g | Freq: Every day | ORAL | Status: DC | PRN
  Filled 2012-10-17: qty 30

## 2012-10-17 MED ORDER — IPRATROPIUM-ALBUTEROL 0.5-2.5 (3) MG/3ML IN SOLN: 3.0000 mL | Freq: Four times a day (QID) | RESPIRATORY_TRACT | Status: DC

## 2012-10-17 MED ORDER — TAMSULOSIN HCL 0.4 MG PO CAPS
0.4000 mg | ORAL_CAPSULE | Freq: Two times a day (BID) | ORAL | Status: DC
  Administered 2012-10-17 – 2012-11-04 (×33): 0.4 mg via ORAL
  Filled 2012-10-17 (×39): qty 1

## 2012-10-17 MED ORDER — ONDANSETRON HCL 4 MG/2ML IJ SOLN: 4.0000 mg | Freq: Four times a day (QID) | INTRAMUSCULAR | Status: DC | PRN

## 2012-10-17 MED ORDER — SODIUM CHLORIDE 0.9 % IV SOLN: 250.0000 mL | INTRAVENOUS | Status: DC | PRN

## 2012-10-17 MED ORDER — SENNOSIDES-DOCUSATE SODIUM 8.6-50 MG PO TABS
1.0000 | ORAL_TABLET | Freq: Two times a day (BID) | ORAL | Status: DC
  Administered 2012-10-17 – 2012-10-24 (×15): 1 via ORAL
  Filled 2012-10-17 (×17): qty 1

## 2012-10-17 MED ORDER — CLONAZEPAM 0.5 MG PO TABS: 0.5000 mg | ORAL_TABLET | Freq: Three times a day (TID) | ORAL | Status: DC | PRN

## 2012-10-17 MED ORDER — SERTRALINE HCL 50 MG PO TABS
50.0000 mg | ORAL_TABLET | Freq: Every evening | ORAL | Status: DC
  Administered 2012-10-17 – 2012-11-01 (×15): 50 mg via ORAL
  Filled 2012-10-17 (×17): qty 1

## 2012-10-17 MED ORDER — IPRATROPIUM BROMIDE 0.02 % IN SOLN
0.5000 mg | Freq: Four times a day (QID) | RESPIRATORY_TRACT | Status: DC
  Administered 2012-10-18 – 2012-10-20 (×13): 0.5 mg via RESPIRATORY_TRACT
  Filled 2012-10-17 (×13): qty 2.5

## 2012-10-17 MED ORDER — DOCUSATE SODIUM 100 MG PO CAPS
100.0000 mg | ORAL_CAPSULE | Freq: Two times a day (BID) | ORAL | Status: DC
  Administered 2012-10-17 – 2012-10-24 (×15): 100 mg via ORAL
  Filled 2012-10-17 (×17): qty 1

## 2012-10-17 MED ORDER — HEPARIN SODIUM (PORCINE) 5000 UNIT/ML IJ SOLN
5000.0000 [IU] | Freq: Three times a day (TID) | INTRAMUSCULAR | Status: DC
  Administered 2012-10-17 – 2012-10-24 (×20): 5000 [IU] via SUBCUTANEOUS
  Filled 2012-10-17 (×26): qty 1

## 2012-10-17 MED ORDER — ACETAMINOPHEN 325 MG PO TABS
650.0000 mg | ORAL_TABLET | ORAL | Status: DC | PRN
  Administered 2012-10-24 (×2): 650 mg via ORAL
  Filled 2012-10-17 (×2): qty 2

## 2012-10-17 NOTE — H&P (Signed)
[facility] History and Physical  [name] [id number] DOB: [birth year] DOA: 10/17/2012  Referring physician: ED PCP: [name], MD  Specialists: None  Chief Complaint: SOB  HPI: [name] is a 72 y.o. male with history significant for recent ICU stay in January, PNA, ARDS, discharged on trach and successfully weaned off of trach, who presents from his NH to [facility] earlier today after having 21 lbs of weight gain in the past 12 days leading to SOB, wet breath sounds, non-productive cough, orthopnea.  At [place] patient was diagnosed as having CHF exacerbation and transferred here to [facility] after getting lasix x1 at ~ 6pm.  Review of Systems: 12 systems reviewed and otherwise negative  Past Medical History  Diagnosis Date  . Hypertension   . Hyperlipidemia   . OSA (obstructive sleep apnea)     on cpap  . Prediabetes   . CHF (congestive heart failure) 07/2012; 10/17/2012  . COPD (chronic obstructive pulmonary disease)   . Pneumonia 07/2012    "H1N1; ARDS; double pneumonia" (10/17/2012)  . Shortness of breath     "all the time; related to CHF" (10/17/2012)  . Diabetes mellitus without complication     "prediabetic; been getting insulin while in SNF" (10/17/2012)  . History of blood transfusion     "lots since January" (10/17/2012)  . Anxiety    Past Surgical History  Procedure Laterality Date  . Transurethral microwave therapy  10/15/2012  . Nasal fracture surgery  1970's  . Tracheostomy  07/2012  . Tracheostomy closure  08/2012  . Anal fissure repair  2008   Social History:  reports that he quit smoking about 2 months ago. His smoking use included Cigarettes. He has a 116 pack-year smoking history. He has never used smokeless tobacco. He reports that he does not drink alcohol or use illicit drugs.   Allergies  Allergen Reactions  . Cephalosporins Rash    History reviewed. No pertinent family history.  Prior to Admission medications   Medication Sig Start  Date End Date Taking? Authorizing Provider  acetaminophen (TYLENOL) 325 MG tablet Take 650 mg by mouth every 8 (eight) hours as needed for pain.   Yes Historical Provider, MD  atorvastatin (LIPITOR) 20 MG tablet Take 20 mg by mouth daily.   Yes Historical Provider, MD  clonazePAM (KLONOPIN) 0.5 MG tablet Take 0.5 mg by mouth 3 (three) times daily as needed for anxiety.   Yes Historical Provider, MD  dexamethasone (DECADRON) 4 MG tablet Take 4 mg by mouth daily with breakfast.   Yes Historical Provider, MD  docusate sodium (COLACE) 100 MG capsule Take 100 mg by mouth 2 (two) times daily.   Yes Historical Provider, MD  dutasteride (AVODART) 0.5 MG capsule Take 0.5 mg by mouth every evening.   Yes Historical Provider, MD  guaifenesin (GERI-TUSSIN) 100 MG/5ML syrup Take 300 mg by mouth every 4 (four) hours as needed for cough.   Yes Historical Provider, MD  insulin regular (NOVOLIN R,HUMULIN R) 100 units/mL injection Inject 0-10 Units into the skin every 6 (six) hours.   Yes Historical Provider, MD  ipratropium-albuterol (DUONEB) 0.5-2.5 (3) MG/3ML SOLN Take 3 mLs by nebulization every 6 (six) hours.   Yes Historical Provider, MD  lactulose (CHRONULAC) 10 GM/15ML solution Take 20 g by mouth daily as needed (constipation).   Yes Historical Provider, MD  metoprolol tartrate (LOPRESSOR) 25 MG tablet Take 12.5 mg by mouth 2 (two) times daily.   Yes Historical Provider, MD  senna-docusate (SENOKOT-S) 8.6-50  MG per tablet Take 1 tablet by mouth 2 (two) times daily.   Yes Historical Provider, MD  sertraline (ZOLOFT) 50 MG tablet Take 50 mg by mouth every evening.   Yes Historical Provider, MD  tamsulosin (FLOMAX) 0.4 MG CAPS Take 0.4 mg by mouth every 12 (twelve) hours.   Yes Historical Provider, MD  torsemide (DEMADEX) 20 MG tablet Take 20 mg by mouth 2 (two) times daily.   Yes Historical Provider, MD  traZODone (DESYREL) 50 MG tablet Take 25 mg by mouth at bedtime.   Yes Historical Provider, MD   Physical  Exam: Filed Vitals:   10/17/12 1900  BP: 134/96  Pulse: 97  Temp: 97.7 F (36.5 C)  TempSrc: Oral  Resp: 18  Height: 5\' 6"  (1.676 m)  Weight: 92.08 kg (203 lb)  SpO2: 91%    General:  NAD, resting comfortably in bed Eyes: PEERLA EOMI ENT: mucous membranes moist Neck: supple w/o JVD Cardiovascular: RRR w/o MRG Respiratory: rhonchi bilaterally throughout Abdomen: soft, nt, nd, bs+ Skin: no rash nor lesion Musculoskeletal: MAE, full ROM all 4 extremities Psychiatric: normal tone and affect Neurologic: AAOx3, grossly non-focal  Labs on Admission:  Basic Metabolic Panel: No results found for this basename: NA, K, CL, CO2, GLUCOSE, BUN, CREATININE, CALCIUM, MG, PHOS,  in the last 168 hours Liver Function Tests: No results found for this basename: AST, ALT, ALKPHOS, BILITOT, PROT, ALBUMIN,  in the last 168 hours No results found for this basename: LIPASE, AMYLASE,  in the last 168 hours No results found for this basename: AMMONIA,  in the last 168 hours CBC: No results found for this basename: WBC, NEUTROABS, HGB, HCT, MCV, PLT,  in the last 168 hours Cardiac Enzymes: No results found for this basename: CKTOTAL, CKMB, CKMBINDEX, TROPONINI,  in the last 168 hours  BNP (last 3 results) No results found for this basename: PROBNP,  in the last 8760 hours CBG: No results found for this basename: GLUCAP,  in the last 168 hours  Radiological Exams on Admission: No results found.  EKG: Independently reviewed.  Assessment/Plan Principal Problem:   CHF, acute Active Problems:   Borderline diabetes   1. CHF - appears to be new diagnosis, diuresing patient with lasix 40 iv q8h, already has gotten a significant amount off since this morning.  On CHF pathway, strict intake and ouput, already on home beta blocker, adding min dose of ACEi,, 2D echo pending, no h/o CHF previously but just got over a massive critical illness.  Serial troponin's ordered but initial trop at [facility] only  0.15.  Currently only requiring 1L O2 via [place]. 2. Borderline DM - putting patient on med dose SSI for now, wasn't diabetic before but was discharged on decadron daily, likely should consider starting to try and taper this off soon if OK with pulmonology. 3. OSA - ordered home CPAP, this NIPPV certainly wont hurt his CHF symptoms either. 4. Prostate CA - s/p radiation, has foley in place for several more weeks at a minimum according to patient and family.    Code Status: Full Code (must indicate code status--if unknown or must be presumed, indicate so) Family Communication: Spoke with family members at bedside (indicate person spoken with, if applicable, with phone number if by telephone) Disposition Plan: Admit to inpatient (indicate anticipated LOS)  Time spent: 70 min  Singleton Hickox M. [facility] Pager [phone]  If 7PM-7AM, please contact night-coverage [email or web address] Password [facility] 10/17/2012, 9:28 PM

## 2012-10-17 NOTE — Plan of Care (Signed)
Called by [facility] for transfer from [facility] ED to [facility], briefly 72 year old male with history of hypertension, CHF who was admitted at [facility] in 1/14 with acute respiratory failure secondary to ARDS, multilobar pneumonia and H1N1/influenza A requiring tracheostomy after VDRF. He was discharged to [place] on 08/12/12. Patient was currently in a nursing facility and, and per the ER physician at [facility] ED, his tracheostomy has been reversed. Patient presented to ED from SNF with shortness of breath, acute on chronic CHF exacerbation, BNP 12900, gained 22lbs.  Vital signs:  afebrile temp 98.2, satting 97% on 2 L O2 via nasal cannula, RR 20,  BP 116/77  Labs white count 9.1 hemoglobin 9.7 creatinine 1.0 ABG Ph 7.4/ Pco2 49, pO2 50 on 1 Liter Patient was not on any Lasix or diuretics.  Patient accepted to [facility] team 10, cardiac telemetry floor.    Reshma Hoey M.D. [facility] 10/17/2012, 5:00 PM  Pager: [phone]

## 2012-10-18 ENCOUNTER — Inpatient Hospital Stay (HOSPITAL_COMMUNITY): Payer: BC Managed Care – PPO

## 2012-10-18 DIAGNOSIS — I319 Disease of pericardium, unspecified: Secondary | ICD-10-CM

## 2012-10-18 DIAGNOSIS — R0602 Shortness of breath: Secondary | ICD-10-CM

## 2012-10-18 DIAGNOSIS — E876 Hypokalemia: Secondary | ICD-10-CM

## 2012-10-18 LAB — BASIC METABOLIC PANEL
BUN: 18 mg/dL (ref 6–23)
BUN: 18 mg/dL (ref 6–23)
Chloride: 105 mEq/L (ref 96–112)
Chloride: 102 mEq/L (ref 96–112)
Creatinine, Ser: 0.91 mg/dL (ref 0.50–1.35)
Creatinine, Ser: 0.82 mg/dL (ref 0.50–1.35)
GFR calc Af Amer: >90 mL/min (ref >90)
GFR calc Af Amer: >90 mL/min (ref >90)

## 2012-10-18 LAB — GLUCOSE, CAPILLARY

## 2012-10-18 LAB — TROPONIN I: Troponin I: 0.36 ng/mL (ref <0.30)

## 2012-10-18 MED ORDER — FUROSEMIDE 10 MG/ML IJ SOLN
40.0000 mg | Freq: Two times a day (BID) | INTRAMUSCULAR | Status: DC
  Administered 2012-10-18 – 2012-10-19 (×2): 40 mg via INTRAVENOUS
  Filled 2012-10-18 (×2): qty 4

## 2012-10-18 MED ORDER — ASPIRIN 325 MG PO TABS
325.0000 mg | ORAL_TABLET | Freq: Every day | ORAL | Status: DC
  Administered 2012-10-18 – 2012-10-19 (×2): 325 mg via ORAL
  Filled 2012-10-18 (×2): qty 1

## 2012-10-18 MED ORDER — GUAIFENESIN-DM 100-10 MG/5ML PO SYRP
5.0000 mL | ORAL_SOLUTION | ORAL | Status: DC | PRN
  Administered 2012-10-18: 5 mL via ORAL
  Filled 2012-10-18 (×2): qty 5

## 2012-10-18 NOTE — Progress Notes (Signed)
[facility] PROGRESS NOTE  [name] [id number] DOB: [birth year] DOA: 10/17/2012 PCP: [name], MD  Assessment/Plan: 1. CHF - appears to be new diagnosis, diuresing patient with lasix 40 iv q8h, already has gotten a significant amount off since this morning. On CHF pathway, strict intake and ouput, already on home beta blocker, adding min dose of ACEi,, 2D echo pending, no h/o CHF previously but just got over a massive critical illness. Serial troponin's ordered but initial trop at [place] only 0.15- trend. Currently only requiring 1L O2 via [place]-patient had appointment to see cardiology on Tuesday and would like to see here 2. Borderline DM - putting patient on med dose SSI for now, wasn't diabetic before but was started on decadron daily- called SNF Dr [phone] to figure out why but office was not open- started 4/4 3. OSA - ordered home CPAP, this NIPPV certainly wont hurt his CHF symptoms either. 4. Prostate CA -  has foley in place for several more weeks at a minimum according to patient and family. 5. Hypokalemia- recheck as has gotten 40 Kdur 6. Hypernatremia- monitor   Code Status: full Family Communication: patient at bedside Disposition Plan:    Consultants:    Procedures:  echo  Antibiotics:    HPI/Subjective: Breathing better  Objective: Filed Vitals:   10/18/12 0233 10/18/12 0512 10/18/12 0941 10/18/12 1020  BP:  97/59  114/75  Pulse:  80  86  Temp:  97.7 F (36.5 C)  98.7 F (37.1 C)  TempSrc:  Oral  Oral  Resp:  19  18  Height:      Weight:  91.717 kg (202 lb 3.2 oz)    SpO2: 96% 100% 99% 98%    Intake/Output Summary (Last 24 hours) at 10/18/12 1048 Last data filed at 10/18/12 0905  Gross per 24 hour  Intake   1083 ml  Output   2750 ml  Net  -1667 ml   Filed Weights   10/17/12 1900 10/18/12 0512  Weight: 92.08 kg (203 lb) 91.717 kg (202 lb 3.2 oz)    Exam:   General:  A+Ox3, NAd  Cardiovascular: rrr  Respiratory:  decreased BS b/l  Abdomen: +BS, soft, NT  Musculoskeletal: moves all 4 ext   Data Reviewed: Basic Metabolic Panel:  Recent Labs Lab 10/18/12 0244  NA 146*  K 3.0*  CL 105  CO2 33*  GLUCOSE 106*  BUN 18  CREATININE 0.91  CALCIUM 9.3   Liver Function Tests: No results found for this basename: AST, ALT, ALKPHOS, BILITOT, PROT, ALBUMIN,  in the last 168 hours No results found for this basename: LIPASE, AMYLASE,  in the last 168 hours No results found for this basename: AMMONIA,  in the last 168 hours CBC: No results found for this basename: WBC, NEUTROABS, HGB, HCT, MCV, PLT,  in the last 168 hours Cardiac Enzymes:  Recent Labs Lab 10/17/12 2151 10/18/12 0244 10/18/12 0935  TROPONINI <0.30 0.36* 0.33*   BNP (last 3 results) No results found for this basename: PROBNP,  in the last 8760 hours CBG:  Recent Labs Lab 10/17/12 2206 10/18/12 0604  GLUCAP 139* 89    Recent Results (from the past 240 hour(s))  MRSA PCR SCREENING     Status: None   Collection Time    10/17/12  8:08 PM      Result Value Range Status   MRSA by PCR NEGATIVE  NEGATIVE Final   Comment:  The GeneXpert MRSA Assay (FDA     approved for NASAL specimens     only), is one component of a     comprehensive MRSA colonization     surveillance program. It is not     intended to diagnose MRSA     infection nor to guide or     monitor treatment for     MRSA infections.     Studies: No results found.  Scheduled Meds: . ipratropium  0.5 mg Nebulization Q6H   And  . albuterol  2.5 mg Nebulization Q6H  . aspirin  325 mg Oral Daily  . atorvastatin  20 mg Oral Daily  . dexamethasone  4 mg Oral Q breakfast  . docusate sodium  100 mg Oral BID  . dutasteride  0.5 mg Oral QPM  . furosemide  40 mg Intravenous Q8H  . heparin  5,000 Units Subcutaneous Q8H  . insulin aspart  0-15 Units Subcutaneous TID WC  . lisinopril  2.5 mg Oral Daily  . metoprolol tartrate  12.5 mg Oral BID  .  senna-docusate  1 tablet Oral BID  . sertraline  50 mg Oral QPM  . sodium chloride  3 mL Intravenous Q12H  . tamsulosin  0.4 mg Oral Q12H  . traZODone  25 mg Oral QHS   Continuous Infusions:   Principal Problem:   CHF, acute Active Problems:   Borderline diabetes    Time spent: [place] Pager [phone]. If 7PM-7AM, please contact night-coverage at [email or web address], password [facility] 10/18/2012, 10:48 AM  LOS: 1 day

## 2012-10-18 NOTE — Progress Notes (Signed)
10/18/12 1313 Noted CM referral for Heart Failure Home Health Screen.  Pt. is from [facility], and will return there.  Butch Penny, CSW, aware of pt. admission. Llana Aliment, RN, BSN NCM [phone]

## 2012-10-18 NOTE — Progress Notes (Signed)
10/17/12 Patient arrived to unit around 1950 by EMS. He was transferred by stretcher with oxygen on at 2 L [place]. Pt. A/Ox4 and showed no signs of respiratory distress. He has chronic foley catheter that was placed week(s) prior to admission related to past medical history of prostate cancer. Patient says he was receiving rehab services at [facility] home prior to admission. MRSA nasal PCR swab is negative.

## 2012-10-18 NOTE — Progress Notes (Signed)
  Echocardiogram 2D Echocardiogram has been performed.  [name] FRANCES 10/18/2012, 12:05 PM

## 2012-10-18 NOTE — Clinical Social Work Psychosocial (Signed)
     Clinical Social Work Department BRIEF PSYCHOSOCIAL ASSESSMENT 10/18/2012  Patient:  [name], [name]     Account Number:  [id number]     Admit date:  10/17/2012  Clinical Social Worker:  Iona Coach  Date/Time:  10/18/2012 02:03 PM  Referred by:  Physician  Date Referred:  10/18/2012 Referred for  SNF Placement   Other Referral:   Return to SNF   Interview type:  Patient Other interview type:    PSYCHOSOCIAL DATA Living Status:  FACILITY Admitted from facility:  [facility] Level of care:   Primary support name:   Primary support relationship to patient:  CHILD, ADULT Degree of support available:   Strong support    CURRENT CONCERNS Current Concerns  Post-Acute Placement   Other Concerns:    SOCIAL WORK ASSESSMENT / PLAN CSW met with patient today- he is a resident of [facility] where is he currently receiving short term rehab. Patient states that he has been away from home "91 days"- first he was at [facility], then he was moved to [facility]. He was scheduled to go home later this week from [facility] when he became ill and had to return to the hospital.  Patient wants to return to [facility] for additional recovery period and then hopes to return home. Fl2 placed on chart for MD's signature.  CSW spoke with Silva Bandy at [facility]. She anticipates that they will have a bed for patient when stable.  Per Dr. Eliseo Squires- anticipate d/c on Monday 10/21/12.   Assessment/plan status:  Psychosocial Support/Ongoing Assessment of Needs Other assessment/ plan:   Information/referral to community resources:   None at this time    PATIENTS/FAMILYS RESPONSE TO PLAN OF CARE: Patient is alert and oriented; very pleasant gentleman who states that he wants to return to [facility] when medically stable. He states that his daughter is contacting the facility to make sure that they keep a room for him. Patient is very friendly and  outgoing- seems to have a positive attitude regarding his current medical condition. He was appreciative of CSW's visit.  His daughter was not present during today's visit.

## 2012-10-18 NOTE — Progress Notes (Signed)
INITIAL NUTRITION ASSESSMENT  DOCUMENTATION CODES Per approved criteria  -Obesity Unspecified   INTERVENTION: 1. Discussed low sodium diet 2. RD will continue to follow    NUTRITION DIAGNOSIS: Unintentional weight loss related to recent illness as evidenced by weight trends.   Goal: PO intake to meet >/=90% estimated nutrition needs  Monitor:  PO intake, weight trends, labs   Reason for Assessment: Malnutrition Screening Tool   72 y.o. male  Admitting Dx: CHF, acute  ASSESSMENT: Pt with recent admission that included intubation for PNA and ARDS. Now admitted from SNF with significant weight gain related to new onset CHF. Being diuresed, planned to go back to SNF at d/c.   Per notes, pt gained 21 lbs in the last 12 days. Pt reports that he was on TF while at [facility] until transitioned to puree foods and then to solids. Pt states his lowest weight was 171 lbs, then started to gain weight back, attributed this to fluids. Reports a very good appetite.   Height: Ht Readings from Last 1 Encounters:  10/17/12 5\' 6"  (1.676 m)    Weight: Wt Readings from Last 1 Encounters:  10/18/12 202 lb 3.2 oz (91.717 kg)    Ideal Body Weight: 142 lbs   % Ideal Body Weight: 142%  Wt Readings from Last 10 Encounters:  10/18/12 202 lb 3.2 oz (91.717 kg)  08/12/12 236 lb 15.9 oz (107.5 kg)   Pt was 242 lbs on admission and 236 lbs at d/c from last admission. Pt states usual weight prior to previous admission was 220 lbs   Usual Body Weight: 220 lbs   % Usual Body Weight: 92%  BMI:  Body mass index is 32.65 kg/(m^2). Overweight   Estimated Nutritional Needs: Kcal: 1800-2000 Protein: 80-90 gm  Fluid: 1.5 L   Skin: intact   Diet Order: Cardiac  EDUCATION NEEDS: -Education needs addressed Pt states he is aware of sodium restriction. Pt is to d/c back to SNF.    Intake/Output Summary (Last 24 hours) at 10/18/12 1335 Last data filed at 10/18/12 1321  Gross per 24 hour  Intake    1443 ml  Output   2750 ml  Net  -1307 ml    Last BM: PTA   Labs:   Recent Labs Lab 10/18/12 0244 10/18/12 0935  NA 146* 143  K 3.0* 3.5  CL 105 102  CO2 33* 33*  BUN 18 18  CREATININE 0.91 0.82  CALCIUM 9.3 9.3  MG  --  1.5  GLUCOSE 106* 154*    CBG (last 3)   Recent Labs  10/17/12 2206 10/18/12 0604 10/18/12 1225  GLUCAP 139* 89 126*    Scheduled Meds: . ipratropium  0.5 mg Nebulization Q6H   And  . albuterol  2.5 mg Nebulization Q6H  . aspirin  325 mg Oral Daily  . atorvastatin  20 mg Oral Daily  . dexamethasone  4 mg Oral Q breakfast  . docusate sodium  100 mg Oral BID  . dutasteride  0.5 mg Oral QPM  . furosemide  40 mg Intravenous Q12H  . heparin  5,000 Units Subcutaneous Q8H  . insulin aspart  0-15 Units Subcutaneous TID WC  . lisinopril  2.5 mg Oral Daily  . metoprolol tartrate  12.5 mg Oral BID  . senna-docusate  1 tablet Oral BID  . sertraline  50 mg Oral QPM  . sodium chloride  3 mL Intravenous Q12H  . tamsulosin  0.4 mg Oral Q12H  . traZODone  25 mg Oral QHS    Continuous Infusions:   Past Medical History  Diagnosis Date  . Hypertension   . Hyperlipidemia   . OSA (obstructive sleep apnea)     on cpap  . Prediabetes   . CHF (congestive heart failure) 07/2012; 10/17/2012  . COPD (chronic obstructive pulmonary disease)   . Pneumonia 07/2012    "H1N1; ARDS; double pneumonia" (10/17/2012)  . Shortness of breath     "all the time; related to CHF" (10/17/2012)  . Diabetes mellitus without complication     "prediabetic; been getting insulin while in SNF" (10/17/2012)  . History of blood transfusion     "lots since January" (10/17/2012)  . Anxiety     Past Surgical History  Procedure Laterality Date  . Transurethral microwave therapy  10/15/2012  . Nasal fracture surgery  1970's  . Tracheostomy  07/2012  . Tracheostomy closure  08/2012  . Anal fissure repair  2008    Orson Slick RD, LDN Pager [phone] After Hours pager [phone]

## 2012-10-18 NOTE — Progress Notes (Signed)
[facility] progress note. Chief complaint. Mildly elevated troponin. History of present illness. This 72 year old male admitted with congestive heart failure after having gained 21 pounds in the past 12 days. Patient has a history of congestive heart failure but I do not see any indication of coronary artery disease. This is the second of 3 troponin has resulted mildly elevated 0.36. Initial troponin was less than 0.30. I came to see the patient at bedside and a 12-lead EKG was obtained. T waves inverted in the lateral leads but I see no other significant changes from prior EKG and no indication of acute ischemia. Patient has no complaints of chest pain or dyspnea. He states his breathing has improved markedly post Lasix. Vital signs. Temperature 97.7, pulse 80, respiration 19, blood pressure 97/59. O2 sats 100%. General appearance. Obese elderly male who is alert, cooperative, and in no distress. Cardiac. Rate and rhythm regular. Patient has diffuse edema/anasarca and positive jugular venous distention. Lungs. Crackles heard in the bases. No distress and stable O2 sats. Abdomen. Soft obese with positive bowel sounds. No pain. Problem #1. Mildly elevated troponin. This in light of 12-lead EKG not suggestive of acute ischemia and in a patient without chest pain. There may be a component of demand ischemia given congestive heart failure. At this point I will follow for the next troponin and defer decision regarding cardiology consult to the rounding physician later this a.m. Patient is already on statin and beta blocker. I will add aspirin now and then daily.

## 2012-10-19 LAB — CBC
HCT: 31.6 % — ABNORMAL LOW (ref 39.0–52.0)
Hemoglobin: 9.6 g/dL — ABNORMAL LOW (ref 13.0–17.0)
RBC: 3.52 MIL/uL — ABNORMAL LOW (ref 4.22–5.81)
WBC: 6.8 10*3/uL (ref 4.0–10.5)

## 2012-10-19 LAB — BASIC METABOLIC PANEL
BUN: 18 mg/dL (ref 6–23)
BUN: 20 mg/dL (ref 6–23)
CO2: 33 mEq/L — ABNORMAL HIGH (ref 19–32)
Calcium: 8.9 mg/dL (ref 8.4–10.5)
Chloride: 103 mEq/L (ref 96–112)
Chloride: 101 mEq/L (ref 96–112)
Creatinine, Ser: 0.86 mg/dL (ref 0.50–1.35)
GFR calc Af Amer: >90 mL/min (ref >90)
Potassium: 3 mEq/L — ABNORMAL LOW (ref 3.5–5.1)
Sodium: 144 mEq/L (ref 135–145)

## 2012-10-19 LAB — PRO B NATRIURETIC PEPTIDE: Pro B Natriuretic peptide (BNP): 7876 pg/mL — ABNORMAL HIGH (ref 0–125)

## 2012-10-19 LAB — GLUCOSE, CAPILLARY: Glucose-Capillary: 171 mg/dL — ABNORMAL HIGH (ref 70–99)

## 2012-10-19 MED ORDER — FUROSEMIDE 10 MG/ML IJ SOLN: 40.0000 mg | Freq: Every day | INTRAMUSCULAR | Status: DC

## 2012-10-19 MED ORDER — CARVEDILOL 3.125 MG PO TABS
3.1250 mg | ORAL_TABLET | Freq: Two times a day (BID) | ORAL | Status: DC
  Administered 2012-10-19 – 2012-10-24 (×10): 3.125 mg via ORAL
  Filled 2012-10-19 (×14): qty 1

## 2012-10-19 MED ORDER — ASPIRIN 81 MG PO CHEW
81.0000 mg | CHEWABLE_TABLET | Freq: Every day | ORAL | Status: DC
  Administered 2012-10-20 – 2012-10-24 (×4): 81 mg via ORAL
  Filled 2012-10-19 (×4): qty 1

## 2012-10-19 MED ORDER — SPIRONOLACTONE 12.5 MG HALF TABLET
12.5000 mg | ORAL_TABLET | Freq: Every day | ORAL | Status: DC
  Administered 2012-10-19 – 2012-10-22 (×4): 12.5 mg via ORAL
  Filled 2012-10-19 (×5): qty 1

## 2012-10-19 MED ORDER — FUROSEMIDE 10 MG/ML IJ SOLN
40.0000 mg | Freq: Three times a day (TID) | INTRAMUSCULAR | Status: DC
  Administered 2012-10-19 – 2012-10-20 (×3): 40 mg via INTRAVENOUS
  Filled 2012-10-19 (×4): qty 4

## 2012-10-19 MED ORDER — POTASSIUM CHLORIDE CRYS ER 20 MEQ PO TBCR
40.0000 meq | EXTENDED_RELEASE_TABLET | ORAL | Status: AC
  Administered 2012-10-19 (×3): 40 meq via ORAL
  Filled 2012-10-19 (×3): qty 2

## 2012-10-19 NOTE — Consult Note (Addendum)
CARDIOLOGY CONSULT NOTE  Patient ID: [name] MRN: [id number] DOB/AGE: [birth year] 71 y.o.  Admit date: 10/17/2012  Primary Cardiologist: New Reason for Consultation: CHF  HPI: 72 yo with history of recent severe illness involving respiratory failure and tracheostomy as well as DM, HTN, and COPD presented to the ER at [facility] with CHF.  Patient had a prolonged hospitalization in 1/14 with H1N1 influenza and pneumococcal PNA.  This progressed to ARDS.  He was intubated and ended up with tracheostomy.  He has had the trach removed.  He also had a left empyema requiring chest tube, septic shock with elevated troponin, and AKI which has resolved.   He has been living at a nursing home.  Over the 2 weeks prior to admission, he was noted to get progressively short of breath.  He had been walking up to 100 yards with his walker but then started to get so short of breath that he would have to stop at about 30 yards.  The day of admission, he was short of breath walking across his room.  + Orthopnea.  He gained 21 lbs.  He has had no chest pain, no history of CAD, had negative stress test 2 years ago.  He went to the ER at [facility] and was thought to have CHF exacerbation, so he was transferred to [facility] He had pulmonary edema on CXR.  He has been diuresed reasonably well so far with IV Lasix.  Echo showed EF 30-35% with global hypokinesis that looked worse in the anteroseptal wall.  He also was noted to have aortic stenosis rated as moderate to severe.   Review of systems complete and found to be negative unless listed above in HPI  Past Medical History: 1. PNA (H1N1 influenza + pneumococcus) in [id number] complicated by respiratory failure/ARDs.  Required tracheostomy, now weaned off.  Had left empyema requiring chest tube.  2. Prostate CA s/p radiation treatment.  Has indwelling foley.  3. OSA 4. HTN 5. Hyperlipidemia 6. COPD: History of heavy smoking.  7. Type II diabetes 8. Anemia of  chronic disease.  9. Cardiomyopathy: Echo (1/14) with severely dilated LV, EF 30-35%, diffuse hypokinesis worse in the anteroseptal wall, grade II diastolic dysfunction, mild MR, AS interpreted as "moderate to severe" with aortic valve mean gradient 23 mmHg.  10. Aortic stenosis.   History reviewed. No pertinent family history.  History   Social History  . Marital Status: Single    Spouse Name: N/A    Number of Children: N/A  . Years of Education: N/A   Occupational History  . Not on file.   Social History Main Topics  . Smoking status: Former Smoker -- 2.00 packs/day for 71 years    Types: Cigarettes    Quit date: 07/20/2012  . Smokeless tobacco: Never Used  . Alcohol Use: No     Comment: 10/17/2012 "years since I've had a drink; never had problem with it"  . Drug Use: No  . Sexually Active: Not Currently   Other Topics Concern  . Not on file   Social History Narrative  . No narrative on file     Prescriptions prior to admission  Medication Sig Dispense Refill  . acetaminophen (TYLENOL) 325 MG tablet Take 650 mg by mouth every 8 (eight) hours as needed for pain.      Marland Kitchen atorvastatin (LIPITOR) 20 MG tablet Take 20 mg by mouth daily.      . clonazePAM (KLONOPIN) 0.5 MG tablet Take  0.5 mg by mouth 3 (three) times daily as needed for anxiety.      Marland Kitchen dexamethasone (DECADRON) 4 MG tablet Take 4 mg by mouth daily with breakfast.      . docusate sodium (COLACE) 100 MG capsule Take 100 mg by mouth 2 (two) times daily.      Marland Kitchen dutasteride (AVODART) 0.5 MG capsule Take 0.5 mg by mouth every evening.      Marland Kitchen guaifenesin (GERI-TUSSIN) 100 MG/5ML syrup Take 300 mg by mouth every 4 (four) hours as needed for cough.      . insulin regular (NOVOLIN R,HUMULIN R) 100 units/mL injection Inject 0-10 Units into the skin every 6 (six) hours.      Marland Kitchen ipratropium-albuterol (DUONEB) 0.5-2.5 (3) MG/3ML SOLN Take 3 mLs by nebulization every 6 (six) hours.      Marland Kitchen lactulose (CHRONULAC) 10 GM/15ML solution  Take 20 g by mouth daily as needed (constipation).      . metoprolol tartrate (LOPRESSOR) 25 MG tablet Take 12.5 mg by mouth 2 (two) times daily.      Marland Kitchen senna-docusate (SENOKOT-S) 8.6-50 MG per tablet Take 1 tablet by mouth 2 (two) times daily.      . sertraline (ZOLOFT) 50 MG tablet Take 50 mg by mouth every evening.      . tamsulosin (FLOMAX) 0.4 MG CAPS Take 0.4 mg by mouth every 12 (twelve) hours.      . torsemide (DEMADEX) 20 MG tablet Take 20 mg by mouth 2 (two) times daily.      . traZODone (DESYREL) 50 MG tablet Take 25 mg by mouth at bedtime.       Current Medications . ipratropium  0.5 mg Nebulization Q6H   And  . albuterol  2.5 mg Nebulization Q6H  . aspirin  325 mg Oral Daily  . atorvastatin  20 mg Oral Daily  . dexamethasone  4 mg Oral Q breakfast  . docusate sodium  100 mg Oral BID  . dutasteride  0.5 mg Oral QPM  . [START ON 10/20/2012] furosemide  40 mg Intravenous Daily  . heparin  5,000 Units Subcutaneous Q8H  . insulin aspart  0-15 Units Subcutaneous TID WC  . lisinopril  2.5 mg Oral Daily  . metoprolol tartrate  12.5 mg Oral BID  . potassium chloride  40 mEq Oral Q4H  . senna-docusate  1 tablet Oral BID  . sertraline  50 mg Oral QPM  . sodium chloride  3 mL Intravenous Q12H  . tamsulosin  0.4 mg Oral Q12H  . traZODone  25 mg Oral QHS     Physical exam Blood pressure 109/72, pulse 80, temperature 97.9 F (36.6 C), temperature source Oral, resp. rate 18, height 5\' 6"  (1.676 m), weight 201 lb 3.2 oz (91.264 kg), SpO2 98.00%. General: NAD Neck: Thick, JVP 12 cm, no thyromegaly or thyroid nodule.  Lungs: Rhonchi bilaterally with decreased breath sounds.  CV: Nondisplaced PMI.  Heart distant sounds, regular S1/S2, no [id number], 2/6 systolic mid-peaking murmur upper sternal border, S2 hear clearly.  2+ edema to knees bilaterally.   Abdomen: Soft, nontender, no hepatosplenomegaly, mildly distended. Skin: Intact without lesions or rashes.  Neurologic: Alert and oriented  x 3.  Psych: Normal affect. Extremities: No clubbing or cyanosis.  HEENT: Normal.   Labs:   Lab Results  Component Value Date   WBC 6.8 10/19/2012   HGB 9.6* 10/19/2012   HCT 31.6* 10/19/2012   MCV 89.8 10/19/2012   PLT 197 10/19/2012  Recent Labs Lab 10/19/12 0514  NA 144  K 3.0*  CL 103  CO2 32  BUN 18  CREATININE 0.76  CALCIUM 8.9  GLUCOSE 144*   Lab Results  Component Value Date   TROPONINI 0.33* 10/18/2012     Radiology: - CXR: CHF  EKG: NSR, old ASMI, lateral TWIs  ASSESSMENT AND PLAN:  72 yo with history of recent severe illness involving respiratory failure and tracheostomy as well as DM, HTN, and COPD presented to the ER at [facility] with acute systolic CHF and was transferred up to [facility]. 1. CHF: Acute systolic CHF with EF [id number] and anteroseptal wall motion abnormality.  Onset of symptoms was gradual, no event that seems consistent with ACS.  Based on his risk factors, ECG, and his regional wall motion abnormality on echo, I am concerned that his cardiomyopathy is ischemic.  He remains volume overloaded.  - Increase Lasix to 40 mg IV every 8 hours with aggressive K repletion - Change metoprolol to low-dose Coreg - Continue current lisinopril dosing, will likely increase tomorrow.  - Add spironolactone 12.5 mg daily.  - He will need LHC/RHC prior to discharge to assess for CAD and to measure filling pressures/CO.  - Check BNP, TSH. 2. CAD: Mild elevation in TnI at admission, suspect demand ischemia due to volume overload.  No history consistent with ACS.  However, I am concerned for an underlying ischemic cardiomyopathy based on ECG and echo.  As above, cath prior to discharge.  Continue ASA (can decrease to 81 mg daily) and statin.  3. Aortic stenosis: Read on echo as moderate to severe.  Mean gradient only 23 mmHg.  By exam sounds moderate.  He may need a TEE to differentiate or additional TTE views.  I will review echo.   Loralie Champagne 10/19/2012 12:29  PM  HR noted to increase to the 120s-130s when he moved around.  Difficult to tell rhythm on telemetry, need to get 12 lead ECG.   Loralie Champagne 10/19/2012 1:03 PM

## 2012-10-19 NOTE — Progress Notes (Signed)
PATIENT HAD 8 BEATS OF V-TACH, ASYMPTOMATIC, CHRIS BERGE NOTIFIED, NO NEW ORDERS GIVEN AT THIS TIME

## 2012-10-19 NOTE — Progress Notes (Signed)
[facility] PROGRESS NOTE  TELLEY BOICE [id number] DOB: [birth year] DOA: 10/17/2012 PCP: [name], MD  Assessment/Plan: 1. CHF - appears to be new diagnosis, diuresing patient with lasix 40 iv q8h- change to q 12, already has gotten a significant amount off since this morning. On CHF pathway, strict intake and ouput, already on home beta blocker, adding min dose of ACEi,, 2D echo shows decreased EF at 30-35% no h/o CHF previously but just got over a massive critical illness- patient had appointment to see cardiology on Tuesday and would like to see here- Dr. [name] consulted  2.  Borderline DM - putting patient on med dose SSI for now, wasn't diabetic before but was started on decadron daily- called SNF Dr [phone] to figure out why but office was not open- medication started 4/4  3.  OSA - ordered home CPAP, this NIPPV certainly wont hurt his CHF symptoms either.  4.  Prostate CA -  has foley in place for several more weeks at a minimum according to patient and family.  5.  Hypokalemia- recheck as has gotten 40 Kdur  6.  Hypernatremia- monitor   Code Status: full Family Communication: patient at bedside Disposition Plan:    Consultants:  cardiology  Procedures:  echo  Antibiotics:    HPI/Subjective: Breathing better  Objective: Filed Vitals:   10/19/12 0211 10/19/12 0522 10/19/12 0540 10/19/12 0820  BP:  92/60 95/61   Pulse:  97 81   Temp:  97.9 F (36.6 C)    TempSrc:  Oral    Resp:  18    Height:      Weight:  91.264 kg (201 lb 3.2 oz)    SpO2: 94% 93%  98%    Intake/Output Summary (Last 24 hours) at 10/19/12 0826 Last data filed at 10/19/12 0612  Gross per 24 hour  Intake   1443 ml  Output   4776 ml  Net  -3333 ml   Filed Weights   10/17/12 1900 10/18/12 0512 10/19/12 0522  Weight: 92.08 kg (203 lb) 91.717 kg (202 lb 3.2 oz) 91.264 kg (201 lb 3.2 oz)    Exam:   General:  A+Ox3, NAd  Cardiovascular: rrr  Respiratory:  decreased BS b/l  Abdomen: +BS, soft, NT  Musculoskeletal: moves all 4 ext   Data Reviewed: Basic Metabolic Panel:  Recent Labs Lab 10/18/12 0244 10/18/12 0935 10/19/12 0514  NA 146* 143 144  K 3.0* 3.5 3.0*  CL 105 102 103  CO2 33* 33* 32  GLUCOSE 106* 154* 144*  BUN 18 18 18   CREATININE 0.91 0.82 0.76  CALCIUM 9.3 9.3 8.9  MG  --  1.5  --    Liver Function Tests: No results found for this basename: AST, ALT, ALKPHOS, BILITOT, PROT, ALBUMIN,  in the last 168 hours No results found for this basename: LIPASE, AMYLASE,  in the last 168 hours No results found for this basename: AMMONIA,  in the last 168 hours CBC:  Recent Labs Lab 10/19/12 0514  WBC 6.8  HGB 9.6*  HCT 31.6*  MCV 89.8  PLT 197   Cardiac Enzymes:  Recent Labs Lab 10/17/12 2151 10/18/12 0244 10/18/12 0935  TROPONINI <0.30 0.36* 0.33*   BNP (last 3 results) No results found for this basename: PROBNP,  in the last 8760 hours CBG:  Recent Labs Lab 10/18/12 0604 10/18/12 1225 10/18/12 1629 10/18/12 2055 10/19/12 0622  GLUCAP 89 126* 117* 156* 113*    Recent Results (from the past  240 hour(s))  MRSA PCR SCREENING     Status: None   Collection Time    10/17/12  8:08 PM      Result Value Range Status   MRSA by PCR NEGATIVE  NEGATIVE Final   Comment:            The GeneXpert MRSA Assay (FDA     approved for NASAL specimens     only), is one component of a     comprehensive MRSA colonization     surveillance program. It is not     intended to diagnose MRSA     infection nor to guide or     monitor treatment for     MRSA infections.     Studies: Dg Chest 2 View  10/18/2012  *RADIOLOGY REPORT*  Clinical Data: Shortness of breath, cough and congestion.  CHEST - 2 VIEW  Comparison: 10/17/2012 and CT chest 08/11/2012.  Findings: Trachea is midline.  Heart is at the upper limits of normal in size.  There is diffuse mixed interstitial and airspace disease with linear scarring at the left  lung base.  Small bilateral pleural effusions.  Findings are superimposed on emphysema.  IMPRESSION: Persistent congestive heart failure, possibly minimally improved from 10/17/2012.   Original Report Authenticated By: Lorin Picket, M.D.     Scheduled Meds: . ipratropium  0.5 mg Nebulization Q6H   And  . albuterol  2.5 mg Nebulization Q6H  . aspirin  325 mg Oral Daily  . atorvastatin  20 mg Oral Daily  . dexamethasone  4 mg Oral Q breakfast  . docusate sodium  100 mg Oral BID  . dutasteride  0.5 mg Oral QPM  . furosemide  40 mg Intravenous Q12H  . heparin  5,000 Units Subcutaneous Q8H  . insulin aspart  0-15 Units Subcutaneous TID WC  . lisinopril  2.5 mg Oral Daily  . metoprolol tartrate  12.5 mg Oral BID  . potassium chloride  40 mEq Oral Q4H  . senna-docusate  1 tablet Oral BID  . sertraline  50 mg Oral QPM  . sodium chloride  3 mL Intravenous Q12H  . tamsulosin  0.4 mg Oral Q12H  . traZODone  25 mg Oral QHS   Continuous Infusions:   Principal Problem:   CHF, acute Active Problems:   Borderline diabetes    Time spent: [place], [place] [facility] Pager [phone]. If 7PM-7AM, please contact night-coverage at [email or web address], password [facility] 10/19/2012, 8:26 AM  LOS: 2 days

## 2012-10-20 DIAGNOSIS — I350 Nonrheumatic aortic (valve) stenosis: Secondary | ICD-10-CM | POA: Diagnosis present

## 2012-10-20 LAB — BASIC METABOLIC PANEL
BUN: 22 mg/dL (ref 6–23)
CO2: 26 mEq/L (ref 19–32)
Chloride: 99 mEq/L (ref 96–112)
GFR calc non Af Amer: >90 mL/min (ref >90)
Glucose, Bld: 110 mg/dL — ABNORMAL HIGH (ref 70–99)
Potassium: 4.7 mEq/L (ref 3.5–5.1)
Sodium: 136 mEq/L (ref 135–145)

## 2012-10-20 LAB — GLUCOSE, CAPILLARY
Glucose-Capillary: 115 mg/dL — ABNORMAL HIGH (ref 70–99)
Glucose-Capillary: 141 mg/dL — ABNORMAL HIGH (ref 70–99)
Glucose-Capillary: 177 mg/dL — ABNORMAL HIGH (ref 70–99)
Glucose-Capillary: 104 mg/dL — ABNORMAL HIGH (ref 70–99)

## 2012-10-20 MED ORDER — FUROSEMIDE 10 MG/ML IJ SOLN
40.0000 mg | Freq: Two times a day (BID) | INTRAMUSCULAR | Status: DC
  Administered 2012-10-20 – 2012-10-24 (×8): 40 mg via INTRAVENOUS
  Filled 2012-10-20 (×10): qty 4

## 2012-10-20 MED ORDER — ALBUTEROL SULFATE (5 MG/ML) 0.5% IN NEBU
2.5000 mg | INHALATION_SOLUTION | RESPIRATORY_TRACT | Status: DC | PRN
  Administered 2012-10-22 – 2012-10-24 (×2): 2.5 mg via RESPIRATORY_TRACT
  Filled 2012-10-20 (×3): qty 0.5

## 2012-10-20 MED ORDER — ALBUTEROL SULFATE (5 MG/ML) 0.5% IN NEBU
2.5000 mg | INHALATION_SOLUTION | Freq: Four times a day (QID) | RESPIRATORY_TRACT | Status: DC
  Administered 2012-10-21: 2.5 mg via RESPIRATORY_TRACT
  Filled 2012-10-20: qty 0.5

## 2012-10-20 MED ORDER — IPRATROPIUM BROMIDE 0.02 % IN SOLN
0.5000 mg | Freq: Four times a day (QID) | RESPIRATORY_TRACT | Status: DC
  Administered 2012-10-21: 0.5 mg via RESPIRATORY_TRACT
  Filled 2012-10-20: qty 2.5

## 2012-10-20 NOTE — Progress Notes (Signed)
Consulting cardiologist: Dr. Loralie Champagne  Subjective:   No chest pain. Since he has done some walking in the halls. Mild shortness of breath. Stable appetite.   Objective:   Temp:  [97.1 F (36.2 C)-97.9 F (36.6 C)] 97.1 F (36.2 C) (04/20 0509) Pulse Rate:  [80-92] 92 (04/20 0820) Resp:  [18-19] 18 (04/20 0509) BP: (89-122)/(57-85) 94/66 mmHg (04/20 0820) SpO2:  [90 %-100 %] 95 % (04/20 0808) Weight:  [199 lb 1.6 oz (90.311 kg)] 199 lb 1.6 oz (90.311 kg) (04/20 0509) Last BM Date: 10/20/12  Filed Weights   10/18/12 0512 10/19/12 0522 10/20/12 0509  Weight: 202 lb 3.2 oz (91.717 kg) 201 lb 3.2 oz (91.264 kg) 199 lb 1.6 oz (90.311 kg)    Intake/Output Summary (Last 24 hours) at 10/20/12 0855 Last data filed at 10/20/12 0626  Gross per 24 hour  Intake   2206 ml  Output   4975 ml  Net  -2769 ml   Telemetry: Sinus rhythm.  Exam:  General: Overweight male, no acute distress.  Lungs: Diminished at bases, scattered rhonchi.  Cardiac: RRR with S3, 2/6 systolic murmur at the base.  Abdomen: Protuberant, nontender.  Extremities: 2+ edema.  Lab Results:  Basic Metabolic Panel:  Recent Labs Lab 10/18/12 0935 10/19/12 0514 10/19/12 1525 10/20/12 0629  NA 143 144 142 136  K 3.5 3.0* 3.5 4.7  CL 102 103 101 99  CO2 33* 32 33* 26  GLUCOSE 154* 144* 129* 110*  BUN 18 18 20 22   CREATININE 0.82 0.76 0.86 0.72  CALCIUM 9.3 8.9 8.9 9.0  MG 1.5  --   --   --     CBC:  Recent Labs Lab 10/19/12 0514  WBC 6.8  HGB 9.6*  HCT 31.6*  MCV 89.8  PLT 197    Cardiac Enzymes:  Recent Labs Lab 10/17/12 2151 10/18/12 0244 10/18/12 0935  TROPONINI <0.30 0.36* 0.33*    BNP:  Recent Labs  10/19/12 1526  PROBNP 7876.0*    ECG: Sinus rhythm, possible old anteroseptal infarct pattern, nonspecific ST changes.   Medications:   Scheduled Medications: . ipratropium  0.5 mg Nebulization Q6H   And  . albuterol  2.5 mg Nebulization Q6H  . aspirin  81 mg  Oral Daily  . atorvastatin  20 mg Oral Daily  . carvedilol  3.125 mg Oral BID WC  . dexamethasone  4 mg Oral Q breakfast  . docusate sodium  100 mg Oral BID  . dutasteride  0.5 mg Oral QPM  . furosemide  40 mg Intravenous Q8H  . heparin  5,000 Units Subcutaneous Q8H  . insulin aspart  0-15 Units Subcutaneous TID WC  . lisinopril  2.5 mg Oral Daily  . senna-docusate  1 tablet Oral BID  . sertraline  50 mg Oral QPM  . sodium chloride  3 mL Intravenous Q12H  . spironolactone  12.5 mg Oral Daily  . tamsulosin  0.4 mg Oral Q12H  . traZODone  25 mg Oral QHS     PRN Medications:  sodium chloride, acetaminophen, clonazePAM, guaiFENesin-dextromethorphan, lactulose, ondansetron (ZOFRAN) IV, sodium chloride   Assessment:   1. Acute systolic heart failure, LVEF 30-35% with anteroseptal wall motion abnormality. Etiology not yet determined particularly in light of recent prolonged severe comorbid illnesses, however ischemic cardiomyopathy is likely. Medical therapy initiated and adjusted by Dr. [name] yesterday.  2. Aortic stenosis, moderate to possibly severe.  3. History of hypertension. Blood pressures recently have been somewhat hypotensive.  4. Type 2 diabetes mellitus.  5. Hyperlipidemia, on statin therapy.   Plan/Discussion:    Relatively low blood pressures limiting further titration of cardiac regimen. Has had substantial diuresis since 4/18. Will cut back Lasix dose to twice daily, try to keep him on his other medications. Ultimately right and left heart catheterization will be pursued. Will also likely need further evaluation of his aortic stenosis, possibly a dobutamine echo if it is felt that he possibly has a low gradient, low output AS.   Satira Sark, M.D., F.A.C.C.

## 2012-10-20 NOTE — Progress Notes (Signed)
Pts. Blood pressure this morning 90/60 with HR 80.  Pt. Has 40 IV lasix and 3.125 PO Coreg due at this time.  Dr. Posey Pronto advised to give one now and hold the other for 2 hours.  Will pass this on to day shift nurse.  Will continue to monitor patient.

## 2012-10-20 NOTE — Progress Notes (Signed)
[facility] PROGRESS NOTE  [name] [id number] DOB: 01-14-1941 DOA: 10/17/2012 PCP: [name], MD  Assessment/Plan: 1.  CHF - appears to be new diagnosis, diuresing patient with lasix 40 iv q8h- change to q 12, already has gotten a significant amount off since this morning. On CHF pathway, strict intake and ouput, already on home beta blocker, adding min dose of ACEi,, 2D echo shows decreased EF at 30-35% no h/o CHF previously but just got over a massive critical illness- appreciate cardiology  2.  Borderline DM - putting patient on med dose SSI for now, wasn't diabetic before but was started on decadron daily- called SNF Dr [phone] to figure out why but office was not open- medication started 4/4  3.  OSA - ordered home CPAP, this NIPPV certainly wont hurt his CHF symptoms either.  4.  Prostate CA -  has foley in place for several more weeks at a minimum according to patient and family.  5.  Hypokalemia- replete  6.  Hypernatremia- resolved   Code Status: full Family Communication: patient at bedside Disposition Plan:    Consultants:  cardiology  Procedures:  echo  Antibiotics:    HPI/Subjective: Breathing better No CP -up walking in room- reminded him to call for assistance before getting up  Objective: Filed Vitals:   10/20/12 0509 10/20/12 0541 10/20/12 0808 10/20/12 0820  BP: 89/60 90/60  94/66  Pulse: 81 80  92  Temp: 97.1 F (36.2 C)     TempSrc: Oral     Resp: 18     Height:      Weight: 90.311 kg (199 lb 1.6 oz)     SpO2: 98%  95%     Intake/Output Summary (Last 24 hours) at 10/20/12 0941 Last data filed at 10/20/12 0859  Gross per 24 hour  Intake   2563 ml  Output   5700 ml  Net  -3137 ml   Filed Weights   10/18/12 0512 10/19/12 0522 10/20/12 0509  Weight: 91.717 kg (202 lb 3.2 oz) 91.264 kg (201 lb 3.2 oz) 90.311 kg (199 lb 1.6 oz)    Exam:   General:  A+Ox3, NAd  Cardiovascular: rrr  Respiratory: decreased  BS b/l  Abdomen: +BS, soft, NT  Musculoskeletal: moves all 4 ext   Data Reviewed: Basic Metabolic Panel:  Recent Labs Lab 10/18/12 0244 10/18/12 0935 10/19/12 0514 10/19/12 1525 10/20/12 0629  NA 146* 143 144 142 136  K 3.0* 3.5 3.0* 3.5 4.7  CL 105 102 103 101 99  CO2 33* 33* 32 33* 26  GLUCOSE 106* 154* 144* 129* 110*  BUN 18 18 18 20 22   CREATININE 0.91 0.82 0.76 0.86 0.72  CALCIUM 9.3 9.3 8.9 8.9 9.0  MG  --  1.5  --   --   --    Liver Function Tests: No results found for this basename: AST, ALT, ALKPHOS, BILITOT, PROT, ALBUMIN,  in the last 168 hours No results found for this basename: LIPASE, AMYLASE,  in the last 168 hours No results found for this basename: AMMONIA,  in the last 168 hours CBC:  Recent Labs Lab 10/19/12 0514  WBC 6.8  HGB 9.6*  HCT 31.6*  MCV 89.8  PLT 197   Cardiac Enzymes:  Recent Labs Lab 10/17/12 2151 10/18/12 0244 10/18/12 0935  TROPONINI <0.30 0.36* 0.33*   BNP (last 3 results)  Recent Labs  10/19/12 1526  PROBNP 7876.0*   CBG:  Recent Labs Lab 10/19/12 0622 10/19/12  1058 10/19/12 1609 10/19/12 2109 10/20/12 0630  GLUCAP 113* 171* 141* 101* 115*    Recent Results (from the past 240 hour(s))  MRSA PCR SCREENING     Status: None   Collection Time    10/17/12  8:08 PM      Result Value Range Status   MRSA by PCR NEGATIVE  NEGATIVE Final   Comment:            The GeneXpert MRSA Assay (FDA     approved for NASAL specimens     only), is one component of a     comprehensive MRSA colonization     surveillance program. It is not     intended to diagnose MRSA     infection nor to guide or     monitor treatment for     MRSA infections.     Studies: Dg Chest 2 View  10/18/2012  *RADIOLOGY REPORT*  Clinical Data: Shortness of breath, cough and congestion.  CHEST - 2 VIEW  Comparison: 10/17/2012 and CT chest 08/11/2012.  Findings: Trachea is midline.  Heart is at the upper limits of normal in size.  There is  diffuse mixed interstitial and airspace disease with linear scarring at the left lung base.  Small bilateral pleural effusions.  Findings are superimposed on emphysema.  IMPRESSION: Persistent congestive heart failure, possibly minimally improved from 10/17/2012.   Original Report Authenticated By: Lorin Picket, M.D.     Scheduled Meds: . ipratropium  0.5 mg Nebulization Q6H   And  . albuterol  2.5 mg Nebulization Q6H  . aspirin  81 mg Oral Daily  . atorvastatin  20 mg Oral Daily  . carvedilol  3.125 mg Oral BID WC  . dexamethasone  4 mg Oral Q breakfast  . docusate sodium  100 mg Oral BID  . dutasteride  0.5 mg Oral QPM  . furosemide  40 mg Intravenous Q12H  . heparin  5,000 Units Subcutaneous Q8H  . insulin aspart  0-15 Units Subcutaneous TID WC  . lisinopril  2.5 mg Oral Daily  . senna-docusate  1 tablet Oral BID  . sertraline  50 mg Oral QPM  . sodium chloride  3 mL Intravenous Q12H  . spironolactone  12.5 mg Oral Daily  . tamsulosin  0.4 mg Oral Q12H  . traZODone  25 mg Oral QHS   Continuous Infusions:   Principal Problem:   CHF, acute Active Problems:   Borderline diabetes   Cardiomyopathy, ischemic   Aortic stenosis    Time spent: [place], [place] [facility] Pager [phone]. If 7PM-7AM, please contact night-coverage at [email or web address], password [facility] 10/20/2012, 9:41 AM  LOS: 3 days

## 2012-10-20 NOTE — Progress Notes (Signed)
Placed patient on CPAP at 13cm with oxygen set at 2lpm with Sp02=96%

## 2012-10-21 DIAGNOSIS — I359 Nonrheumatic aortic valve disorder, unspecified: Secondary | ICD-10-CM

## 2012-10-21 LAB — GLUCOSE, CAPILLARY
Glucose-Capillary: 131 mg/dL — ABNORMAL HIGH (ref 70–99)
Glucose-Capillary: 165 mg/dL — ABNORMAL HIGH (ref 70–99)
Glucose-Capillary: 136 mg/dL — ABNORMAL HIGH (ref 70–99)

## 2012-10-21 LAB — BASIC METABOLIC PANEL
CO2: 32 mEq/L (ref 19–32)
Chloride: 95 mEq/L — ABNORMAL LOW (ref 96–112)
Glucose, Bld: 185 mg/dL — ABNORMAL HIGH (ref 70–99)
Potassium: 4.4 mEq/L (ref 3.5–5.1)
Sodium: 139 mEq/L (ref 135–145)

## 2012-10-21 MED ORDER — LISINOPRIL 2.5 MG PO TABS
2.5000 mg | ORAL_TABLET | Freq: Two times a day (BID) | ORAL | Status: DC
  Administered 2012-10-21 – 2012-10-22 (×4): 2.5 mg via ORAL
  Filled 2012-10-21 (×6): qty 1

## 2012-10-21 MED ORDER — ALBUTEROL SULFATE (5 MG/ML) 0.5% IN NEBU
2.5000 mg | INHALATION_SOLUTION | Freq: Three times a day (TID) | RESPIRATORY_TRACT | Status: DC
  Administered 2012-10-21 – 2012-10-24 (×10): 2.5 mg via RESPIRATORY_TRACT
  Filled 2012-10-21 (×10): qty 0.5

## 2012-10-21 MED ORDER — IPRATROPIUM BROMIDE 0.02 % IN SOLN
0.5000 mg | Freq: Three times a day (TID) | RESPIRATORY_TRACT | Status: DC
  Administered 2012-10-21 – 2012-10-24 (×10): 0.5 mg via RESPIRATORY_TRACT
  Filled 2012-10-21 (×11): qty 2.5

## 2012-10-21 NOTE — Progress Notes (Signed)
  Echocardiogram 2D Echocardiogram has been performed.  [name] 10/21/2012, 11:31 AM

## 2012-10-21 NOTE — Progress Notes (Deleted)
Patient discussed with [name], Psych CSW this morning.  Patient was assess by psychiatrist and found to need return to [facility].  He will require reassessment however, when stable to determine if he still needs behavioral health placement.  CSW will continue to monitor.  Lorie Phenix. [place], [place]

## 2012-10-21 NOTE — Progress Notes (Signed)
[facility] PROGRESS NOTE  [name] [id number] DOB: 18-Apr-1941 DOA: 10/17/2012 PCP: [name], MD  Assessment/Plan: 1.  CHF - appears to be new diagnosis, diuresing patient with lasix 40 iv q8h- change to q 12, already has gotten a significant amount off -- down 6 lbs; beta blocker, adding min dose of ACEi,, 2D echo shows decreased EF at 30-35% no h/o CHF previously but just got over a massive critical illness- appreciate cardiology- plan for repeat echo and cath  2.  Borderline DM - putting patient on med dose SSI for now, wasn't diabetic before but was started on decadron daily- called SNF Dr -[id number]- medication started 4/4- patient states he never saw the doctor  3.  OSA - ordered home CPAP, this NIPPV certainly wont hurt his CHF symptoms either.  4.  Prostate CA -  has foley in place for several more weeks at a minimum according to patient and family.  5.  Hypokalemia- replete  6.  Hypernatremia- resolved   Code Status: full Family Communication: patient at bedside Disposition Plan:    Consultants:  cardiology  Procedures:  echo  Antibiotics:    HPI/Subjective: walking around in room, gave self bath   Objective: Filed Vitals:   10/20/12 2025 10/20/12 2252 10/21/12 0538 10/21/12 0824  BP: 104/75  102/50   Pulse: 88 85 87   Temp: 98.7 F (37.1 C)  97.3 F (36.3 C)   TempSrc: Oral  Oral   Resp: 20 18 21    Height:      Weight:   89.449 kg (197 lb 3.2 oz)   SpO2: 100% 96% 95% 94%    Intake/Output Summary (Last 24 hours) at 10/21/12 0953 Last data filed at 10/21/12 0829  Gross per 24 hour  Intake   1623 ml  Output   3327 ml  Net  -1704 ml   Filed Weights   10/19/12 0522 10/20/12 0509 10/21/12 0538  Weight: 91.264 kg (201 lb 3.2 oz) 90.311 kg (199 lb 1.6 oz) 89.449 kg (197 lb 3.2 oz)    Exam:   General:  A+Ox3, NAd  Cardiovascular: rrr  Respiratory: decreased BS b/l  Abdomen: +BS, soft, NT  Musculoskeletal: moves all  4 ext , decreasing edema  Data Reviewed: Basic Metabolic Panel:  Recent Labs Lab 10/18/12 0244 10/18/12 0935 10/19/12 0514 10/19/12 1525 10/20/12 0629  NA 146* 143 144 142 136  K 3.0* 3.5 3.0* 3.5 4.7  CL 105 102 103 101 99  CO2 33* 33* 32 33* 26  GLUCOSE 106* 154* 144* 129* 110*  BUN 18 18 18 20 22   CREATININE 0.91 0.82 0.76 0.86 0.72  CALCIUM 9.3 9.3 8.9 8.9 9.0  MG  --  1.5  --   --   --    Liver Function Tests: No results found for this basename: AST, ALT, ALKPHOS, BILITOT, PROT, ALBUMIN,  in the last 168 hours No results found for this basename: LIPASE, AMYLASE,  in the last 168 hours No results found for this basename: AMMONIA,  in the last 168 hours CBC:  Recent Labs Lab 10/19/12 0514  WBC 6.8  HGB 9.6*  HCT 31.6*  MCV 89.8  PLT 197   Cardiac Enzymes:  Recent Labs Lab 10/17/12 2151 10/18/12 0244 10/18/12 0935  TROPONINI <0.30 0.36* 0.33*   BNP (last 3 results)  Recent Labs  10/19/12 1526  PROBNP 7876.0*   CBG:  Recent Labs Lab 10/20/12 0630 10/20/12 1058 10/20/12 1658 10/20/12 2041 10/21/12 [id number]  GLUCAP  115* 141* 177* 104* 102*    Recent Results (from the past 240 hour(s))  MRSA PCR SCREENING     Status: None   Collection Time    10/17/12  8:08 PM      Result Value Range Status   MRSA by PCR NEGATIVE  NEGATIVE Final   Comment:            The GeneXpert MRSA Assay (FDA     approved for NASAL specimens     only), is one component of a     comprehensive MRSA colonization     surveillance program. It is not     intended to diagnose MRSA     infection nor to guide or     monitor treatment for     MRSA infections.     Studies: No results found.  Scheduled Meds: . albuterol  2.5 mg Nebulization QID  . aspirin  81 mg Oral Daily  . atorvastatin  20 mg Oral Daily  . carvedilol  3.125 mg Oral BID WC  . dexamethasone  4 mg Oral Q breakfast  . docusate sodium  100 mg Oral BID  . dutasteride  0.5 mg Oral QPM  . furosemide  40 mg  Intravenous Q12H  . heparin  5,000 Units Subcutaneous Q8H  . insulin aspart  0-15 Units Subcutaneous TID WC  . ipratropium  0.5 mg Nebulization QID  . lisinopril  2.5 mg Oral BID  . senna-docusate  1 tablet Oral BID  . sertraline  50 mg Oral QPM  . sodium chloride  3 mL Intravenous Q12H  . spironolactone  12.5 mg Oral Daily  . tamsulosin  0.4 mg Oral Q12H  . traZODone  25 mg Oral QHS   Continuous Infusions:   Principal Problem:   CHF, acute Active Problems:   Borderline diabetes   Cardiomyopathy, ischemic   Aortic stenosis    Time spent: [place], [place] [facility] Pager [phone]. If 7PM-7AM, please contact night-coverage at [email or web address], password [facility] 10/21/2012, 9:53 AM  LOS: 4 days

## 2012-10-21 NOTE — Progress Notes (Signed)
Placed patient on CPAP QHS.  Patient tolerating CPAP well.

## 2012-10-21 NOTE — Progress Notes (Signed)
Patient ID: [name], male   DOB: 05-31-1941, 72 y.o.   MRN: [id number]    SUBJECTIVE: Feeling better this morning.  Good diuresis.  Weight is down 6 lbs since admission.   Marland Kitchen albuterol  2.5 mg Nebulization QID  . aspirin  81 mg Oral Daily  . atorvastatin  20 mg Oral Daily  . carvedilol  3.125 mg Oral BID WC  . dexamethasone  4 mg Oral Q breakfast  . docusate sodium  100 mg Oral BID  . dutasteride  0.5 mg Oral QPM  . furosemide  40 mg Intravenous Q12H  . heparin  5,000 Units Subcutaneous Q8H  . insulin aspart  0-15 Units Subcutaneous TID WC  . ipratropium  0.5 mg Nebulization QID  . lisinopril  2.5 mg Oral BID  . senna-docusate  1 tablet Oral BID  . sertraline  50 mg Oral QPM  . sodium chloride  3 mL Intravenous Q12H  . spironolactone  12.5 mg Oral Daily  . tamsulosin  0.4 mg Oral Q12H  . traZODone  25 mg Oral QHS      Filed Vitals:   10/20/12 2025 10/20/12 2252 10/21/12 0538 10/21/12 0824  BP: 104/75  102/50   Pulse: 88 85 87   Temp: 98.7 F (37.1 C)  97.3 F (36.3 C)   TempSrc: Oral  Oral   Resp: 20 18 21    Height:      Weight:   197 lb 3.2 oz (89.449 kg)   SpO2: 100% 96% 95% 94%    Intake/Output Summary (Last 24 hours) at 10/21/12 0843 Last data filed at 10/21/12 0829  Gross per 24 hour  Intake   1983 ml  Output   4052 ml  Net  -2069 ml    LABS: Basic Metabolic Panel:  Recent Labs  10/18/12 0935  10/19/12 1525 10/20/12 0629  NA 143  < > 142 136  K 3.5  < > 3.5 4.7  CL 102  < > 101 99  CO2 33*  < > 33* 26  GLUCOSE 154*  < > 129* 110*  BUN 18  < > 20 22  CREATININE 0.82  < > 0.86 0.72  CALCIUM 9.3  < > 8.9 9.0  MG 1.5  --   --   --   < > = values in this interval not displayed. Liver Function Tests: No results found for this basename: AST, ALT, ALKPHOS, BILITOT, PROT, ALBUMIN,  in the last 72 hours No results found for this basename: LIPASE, AMYLASE,  in the last 72 hours CBC:  Recent Labs  10/19/12 0514  WBC 6.8  HGB 9.6*  HCT 31.6*    MCV 89.8  PLT 197   Cardiac Enzymes:  Recent Labs  10/18/12 0935  TROPONINI 0.33*   BNP: No components found with this basename: POCBNP,  D-Dimer: No results found for this basename: DDIMER,  in the last 72 hours Hemoglobin A1C: No results found for this basename: HGBA1C,  in the last 72 hours Fasting Lipid Panel: No results found for this basename: CHOL, HDL, LDLCALC, TRIG, CHOLHDL, LDLDIRECT,  in the last 72 hours Thyroid Function Tests:  Recent Labs  10/19/12 1525  TSH 2.253   Anemia Panel: No results found for this basename: VITAMINB12, FOLATE, FERRITIN, TIBC, IRON, RETICCTPCT,  in the last 72 hours  RADIOLOGY: Dg Chest 2 View  10/18/2012  *RADIOLOGY REPORT*  Clinical Data: Shortness of breath, cough and congestion.  CHEST - 2 VIEW  Comparison: 10/17/2012  and CT chest 08/11/2012.  Findings: Trachea is midline.  Heart is at the upper limits of normal in size.  There is diffuse mixed interstitial and airspace disease with linear scarring at the left lung base.  Small bilateral pleural effusions.  Findings are superimposed on emphysema.  IMPRESSION: Persistent congestive heart failure, possibly minimally improved from 10/17/2012.   Original Report Authenticated By: Lorin Picket, M.D.     PHYSICAL EXAM General: NAD Neck: JVP 10-12 cm, no thyromegaly or thyroid nodule.  Lungs: Clear to auscultation bilaterally with normal respiratory effort. CV: Nondisplaced PMI.  Heart regular S1/S2, no [id number], 2/6 systolic murmur RUSB, S2 heard clearly.  2+ edema to knees bilaterally.  No carotid bruit.   Abdomen: Soft, nontender, no hepatosplenomegaly, no distention.  Neurologic: Alert and oriented x 3.  Psych: Normal affect. Extremities: No clubbing or cyanosis.   TELEMETRY: Reviewed telemetry pt in NSR  ASSESSMENT AND PLAN: 72 yo with history of recent severe illness involving respiratory failure and tracheostomy as well as DM, HTN, and COPD presented to the ER at [facility] with  acute systolic CHF and was transferred up to [facility].  1. CHF: Acute systolic CHF with EF 99991111 and anteroseptal wall motion abnormality. Onset of symptoms was gradual, no event that seems consistent with ACS. Based on his risk factors, ECG, and his regional wall motion abnormality on echo, I am concerned that his cardiomyopathy is ischemic. He remains volume overloaded.  - Continue Lasix 40 IV bid, he has had a good diuresis at this dose.  - Continue current Coreg and spironolactone. - Increase lisinopril to 2.5 mg bid.  - He will need LHC/RHC prior to discharge to assess for CAD and to measure filling pressures/CO. Will do either tomorrow or Wednesday most likely.  2. CAD: Mild elevation in TnI at admission, suspect demand ischemia due to volume overload. No history consistent with ACS. However, I am concerned for an underlying ischemic cardiomyopathy based on ECG and echo. As above, cath prior to discharge. Continue ASA and statin.  3. Aortic stenosis: I reviewed echo.  Moderate to severe AS with mean gradient around 25 but AVA 0.8 cm^2 calculated.  I will get a limited echo today with additional views for gradient measurement/valve area calculation.  Will see what coronaries look like on cath, may need low dose DSE to assess valve more closely but would not do this until coronary disease is defined.    Loralie Champagne 10/21/2012

## 2012-10-22 ENCOUNTER — Encounter (HOSPITAL_COMMUNITY): Payer: BC Managed Care – PPO

## 2012-10-22 ENCOUNTER — Other Ambulatory Visit: Payer: Self-pay | Admitting: *Deleted

## 2012-10-22 ENCOUNTER — Encounter (HOSPITAL_COMMUNITY)
Admission: AD | Disposition: A | Discharge status: Skilled Nursing Facility | Payer: Self-pay | Source: Other Acute Inpatient Hospital | Attending: Thoracic Surgery (Cardiothoracic Vascular Surgery)

## 2012-10-22 DIAGNOSIS — I2589 Other forms of chronic ischemic heart disease: Secondary | ICD-10-CM

## 2012-10-22 DIAGNOSIS — I359 Nonrheumatic aortic valve disorder, unspecified: Secondary | ICD-10-CM

## 2012-10-22 DIAGNOSIS — I2510 Atherosclerotic heart disease of native coronary artery without angina pectoris: Secondary | ICD-10-CM

## 2012-10-22 HISTORY — PX: LEFT AND RIGHT HEART CATHETERIZATION WITH CORONARY ANGIOGRAM: SHX5449

## 2012-10-22 LAB — CBC
Hemoglobin: 10 g/dL — ABNORMAL LOW (ref 13.0–17.0)
Hemoglobin: 10.8 g/dL — ABNORMAL LOW (ref 13.0–17.0)
MCHC: 30.8 g/dL (ref 30.0–36.0)
MCHC: 30.4 g/dL (ref 30.0–36.0)
Platelets: 203 10*3/uL (ref 150–400)
Platelets: 158 10*3/uL (ref 150–400)
RBC: 3.6 MIL/uL — ABNORMAL LOW (ref 4.22–5.81)
RBC: 3.91 MIL/uL — ABNORMAL LOW (ref 4.22–5.81)

## 2012-10-22 LAB — POCT I-STAT 3, VENOUS BLOOD GAS (G3P V)
O2 Saturation: 68 %
pCO2, Ven: 55.3 mmHg — ABNORMAL HIGH (ref 45.0–50.0)
pH, Ven: 7.379 — ABNORMAL HIGH (ref 7.250–7.300)
pO2, Ven: 37 mmHg (ref 30.0–45.0)

## 2012-10-22 LAB — GLUCOSE, CAPILLARY
Glucose-Capillary: 108 mg/dL — ABNORMAL HIGH (ref 70–99)
Glucose-Capillary: 148 mg/dL — ABNORMAL HIGH (ref 70–99)
Glucose-Capillary: 123 mg/dL — ABNORMAL HIGH (ref 70–99)
Glucose-Capillary: 153 mg/dL — ABNORMAL HIGH (ref 70–99)
Glucose-Capillary: 193 mg/dL — ABNORMAL HIGH (ref 70–99)

## 2012-10-22 LAB — BASIC METABOLIC PANEL
Calcium: 9.1 mg/dL (ref 8.4–10.5)
GFR calc Af Amer: >90 mL/min (ref >90)
GFR calc non Af Amer: 90 mL/min — ABNORMAL LOW (ref >90)
Potassium: 3.4 mEq/L — ABNORMAL LOW (ref 3.5–5.1)
Sodium: 142 mEq/L (ref 135–145)

## 2012-10-22 LAB — POCT I-STAT 3, ART BLOOD GAS (G3+)
TCO2: 33 mmol/L (ref 0–100)
pCO2 arterial: 47.9 mmHg — ABNORMAL HIGH (ref 35.0–45.0)
pH, Arterial: 7.428 (ref 7.350–7.450)

## 2012-10-22 LAB — CREATININE, SERUM
Creatinine, Ser: 0.7 mg/dL (ref 0.50–1.35)
GFR calc non Af Amer: >90 mL/min (ref >90)

## 2012-10-22 SURGERY — LEFT AND RIGHT HEART CATHETERIZATION WITH CORONARY ANGIOGRAM
Anesthesia: LOCAL

## 2012-10-22 MED ORDER — ASPIRIN 81 MG PO CHEW
324.0000 mg | CHEWABLE_TABLET | ORAL | Status: AC
  Administered 2012-10-22: 324 mg via ORAL
  Filled 2012-10-22: qty 4

## 2012-10-22 MED ORDER — SODIUM CHLORIDE 0.9 % IV SOLN: 250.0000 mL | INTRAVENOUS | Status: DC | PRN

## 2012-10-22 MED ORDER — SODIUM CHLORIDE 0.9 % IJ SOLN
3.0000 mL | Freq: Two times a day (BID) | INTRAMUSCULAR | Status: DC
  Administered 2012-10-22: 3 mL via INTRAVENOUS

## 2012-10-22 MED ORDER — ATORVASTATIN CALCIUM 80 MG PO TABS
80.0000 mg | ORAL_TABLET | Freq: Every day | ORAL | Status: DC
  Administered 2012-10-23 – 2012-11-04 (×10): 80 mg via ORAL
  Filled 2012-10-22 (×13): qty 1

## 2012-10-22 MED ORDER — SODIUM CHLORIDE 0.9 % IV SOLN: INTRAVENOUS | Status: DC

## 2012-10-22 MED ORDER — SODIUM CHLORIDE 0.9 % IJ SOLN: 3.0000 mL | INTRAMUSCULAR | Status: DC | PRN

## 2012-10-22 MED ORDER — SODIUM CHLORIDE 0.9 % IJ SOLN: 3.0000 mL | Freq: Two times a day (BID) | INTRAMUSCULAR | Status: DC

## 2012-10-22 MED ORDER — FENTANYL CITRATE 0.05 MG/ML IJ SOLN
INTRAMUSCULAR | Status: AC
  Filled 2012-10-22: qty 2

## 2012-10-22 MED ORDER — ACETAMINOPHEN 325 MG PO TABS: 650.0000 mg | ORAL_TABLET | ORAL | Status: DC | PRN

## 2012-10-22 MED ORDER — HEPARIN (PORCINE) IN NACL 2-0.9 UNIT/ML-% IJ SOLN
INTRAMUSCULAR | Status: AC
  Filled 2012-10-22: qty 1000

## 2012-10-22 MED ORDER — SODIUM CHLORIDE 0.9 % IV SOLN
INTRAVENOUS | Status: AC
  Administered 2012-10-22: 19:00:00 via INTRAVENOUS

## 2012-10-22 MED ORDER — LIDOCAINE HCL (PF) 1 % IJ SOLN
INTRAMUSCULAR | Status: AC
  Filled 2012-10-22: qty 30

## 2012-10-22 MED ORDER — MIDAZOLAM HCL 2 MG/2ML IJ SOLN
INTRAMUSCULAR | Status: AC
  Filled 2012-10-22: qty 2

## 2012-10-22 MED ORDER — POTASSIUM CHLORIDE CRYS ER 20 MEQ PO TBCR
40.0000 meq | EXTENDED_RELEASE_TABLET | Freq: Once | ORAL | Status: AC
  Administered 2012-10-22: 40 meq via ORAL
  Filled 2012-10-22: qty 2

## 2012-10-22 MED ORDER — HEPARIN SODIUM (PORCINE) 5000 UNIT/ML IJ SOLN: 5000.0000 [IU] | Freq: Three times a day (TID) | INTRAMUSCULAR | Status: DC

## 2012-10-22 MED ORDER — ONDANSETRON HCL 4 MG/2ML IJ SOLN: 4.0000 mg | Freq: Four times a day (QID) | INTRAMUSCULAR | Status: DC | PRN

## 2012-10-22 NOTE — CV Procedure (Addendum)
   Cardiac Catheterization Procedure Note  Name: [name] MRN: [id number] DOB: 03/08/1941  Procedure: Right Heart Cath,  Selective Coronary Angiography  Indication: Cardiomyopathy, CHF exacerbation.    Procedural Details: Right brachial area prepped for RHC.  The right wrist was assess for radial access but Allen's test was clearly negative.  The right groin was prepped, draped, and anesthetized with 1% lidocaine. 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  Using the modified Seldinger technique a 5 French sheath was placed in the right femoral artery and a brachial sheath was placed in the right brachial vein. A 73F Swan-Ganz catheter was used for the right heart catheterization. Standard protocol was followed for recording of right heart pressures and sampling of oxygen saturations. Fick cardiac output was calculated. Standard Judkins catheters were used for selective coronary angiography. There were no immediate procedural complications. The patient was transferred to the post catheterization recovery area for further monitoring.  Procedural Findings: Hemodynamics (mmHg) RA mean 9  RV 48/11 PA 48/19, mean 32 PCWP mean 22 AO 101/67  Oxygen saturations: PA 68% AO 98%  Cardiac Output (Fick) 6.45 L/min  Cardiac Index (Fick) 3.26   Coronary angiography: Coronary dominance: right  Left mainstem: 30-40% distal tapering.   Left anterior descending (LAD): 95% proximal LAD after 1st septal.   Left circumflex (LCx): 70-80% ostial stenosis in a large ramus.  70% mid LCx stenosis.   Right coronary artery (RCA): 90% irregular proximal RCA stenosis.  Moderate luminals up to 40% throughout the RCA.    Left ventriculography: Not done, at least moderate AS by echo. EF around 35% by echo.   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  Ideally, revascularization would be done by CABG with bioprosthetic aortic valve replacement.  However, he has multiple comorbidities.  He had a long hospitalization for ARDS/PNA in 1/14 with trach, now recovered.  He lives in a nursing home now but has been walking with a walker.  He prostate cancer that is being treated with indwelling foley.  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  I will involve CVTS and vascular surgery before making decision on how to proceed.  Could intervene percutaneously but would likely not be a full revascularization.  Mildly elevated right and left heart filling pressures.  Continue IV Lasix.   Loralie Champagne 10/22/2012, 1:42 PM

## 2012-10-22 NOTE — Interval H&P Note (Signed)
History and Physical Interval Note:  10/22/2012 12:33 PM  [name]  has presented today for surgery, with the diagnosis of cp  The various methods of treatment have been discussed with the patient and family. After consideration of risks, benefits and other options for treatment, the patient has consented to  Procedure(s): LEFT AND RIGHT HEART CATHETERIZATION WITH CORONARY ANGIOGRAM (N/A) as a surgical intervention .  The patient's history has been reviewed, patient examined, no change in status, stable for surgery.  I have reviewed the patient's chart and labs.  Questions were answered to the patient's satisfaction.     [place] [employer]

## 2012-10-22 NOTE — Progress Notes (Signed)
[place] a call to Dr. Claris Gladden service > dana Dunn, [place]  On call  Re; BPs 71/5o - 85/50 . Pt asymptomatic . Bedrest obsreved

## 2012-10-22 NOTE — H&P (View-Only) (Signed)
Patient ID: [name], male   DOB: [birth year], 72 y.o.   MRN: [id number]    SUBJECTIVE: Feeling better this morning.  Good diuresis.  Weight is down 6 lbs since admission.   Marland Kitchen albuterol  2.5 mg Nebulization QID  . aspirin  81 mg Oral Daily  . atorvastatin  20 mg Oral Daily  . carvedilol  3.125 mg Oral BID WC  . dexamethasone  4 mg Oral Q breakfast  . docusate sodium  100 mg Oral BID  . dutasteride  0.5 mg Oral QPM  . furosemide  40 mg Intravenous Q12H  . heparin  5,000 Units Subcutaneous Q8H  . insulin aspart  0-15 Units Subcutaneous TID WC  . ipratropium  0.5 mg Nebulization QID  . lisinopril  2.5 mg Oral BID  . senna-docusate  1 tablet Oral BID  . sertraline  50 mg Oral QPM  . sodium chloride  3 mL Intravenous Q12H  . spironolactone  12.5 mg Oral Daily  . tamsulosin  0.4 mg Oral Q12H  . traZODone  25 mg Oral QHS      Filed Vitals:   10/20/12 2025 10/20/12 2252 10/21/12 0538 10/21/12 0824  BP: 104/75  102/50   Pulse: 88 85 87   Temp: 98.7 F (37.1 C)  97.3 F (36.3 C)   TempSrc: Oral  Oral   Resp: 20 18 21    Height:      Weight:   197 lb 3.2 oz (89.449 kg)   SpO2: 100% 96% 95% 94%    Intake/Output Summary (Last 24 hours) at 10/21/12 0843 Last data filed at 10/21/12 0829  Gross per 24 hour  Intake   1983 ml  Output   4052 ml  Net  -2069 ml    LABS: Basic Metabolic Panel:  Recent Labs  10/18/12 0935  10/19/12 1525 10/20/12 0629  NA 143  < > 142 136  K 3.5  < > 3.5 4.7  CL 102  < > 101 99  CO2 33*  < > 33* 26  GLUCOSE 154*  < > 129* 110*  BUN 18  < > 20 22  CREATININE 0.82  < > 0.86 0.72  CALCIUM 9.3  < > 8.9 9.0  MG 1.5  --   --   --   < > = values in this interval not displayed. Liver Function Tests: No results found for this basename: AST, ALT, ALKPHOS, BILITOT, PROT, ALBUMIN,  in the last 72 hours No results found for this basename: LIPASE, AMYLASE,  in the last 72 hours CBC:  Recent Labs  10/19/12 0514  WBC 6.8  HGB 9.6*  HCT 31.6*    MCV 89.8  PLT 197   Cardiac Enzymes:  Recent Labs  10/18/12 0935  TROPONINI 0.33*   BNP: No components found with this basename: POCBNP,  D-Dimer: No results found for this basename: DDIMER,  in the last 72 hours Hemoglobin A1C: No results found for this basename: HGBA1C,  in the last 72 hours Fasting Lipid Panel: No results found for this basename: CHOL, HDL, LDLCALC, TRIG, CHOLHDL, LDLDIRECT,  in the last 72 hours Thyroid Function Tests:  Recent Labs  10/19/12 1525  TSH 2.253   Anemia Panel: No results found for this basename: VITAMINB12, FOLATE, FERRITIN, TIBC, IRON, RETICCTPCT,  in the last 72 hours  RADIOLOGY: Dg Chest 2 View  10/18/2012  *RADIOLOGY REPORT*  Clinical Data: Shortness of breath, cough and congestion.  CHEST - 2 VIEW  Comparison: 10/17/2012  and CT chest 08/11/2012.  Findings: Trachea is midline.  Heart is at the upper limits of normal in size.  There is diffuse mixed interstitial and airspace disease with linear scarring at the left lung base.  Small bilateral pleural effusions.  Findings are superimposed on emphysema.  IMPRESSION: Persistent congestive heart failure, possibly minimally improved from 10/17/2012.   Original Report Authenticated By: Lorin Picket, M.D.     PHYSICAL EXAM General: NAD Neck: JVP 10-12 cm, no thyromegaly or thyroid nodule.  Lungs: Clear to auscultation bilaterally with normal respiratory effort. CV: Nondisplaced PMI.  Heart regular S1/S2, no [id number], 2/6 systolic murmur RUSB, S2 heard clearly.  2+ edema to knees bilaterally.  No carotid bruit.   Abdomen: Soft, nontender, no hepatosplenomegaly, no distention.  Neurologic: Alert and oriented x 3.  Psych: Normal affect. Extremities: No clubbing or cyanosis.   TELEMETRY: Reviewed telemetry pt in NSR  ASSESSMENT AND PLAN: 72 yo with history of recent severe illness involving respiratory failure and tracheostomy as well as DM, HTN, and COPD presented to the ER at [facility] with  acute systolic CHF and was transferred up to [facility].  1. CHF: Acute systolic CHF with EF 99991111 and anteroseptal wall motion abnormality. Onset of symptoms was gradual, no event that seems consistent with ACS. Based on his risk factors, ECG, and his regional wall motion abnormality on echo, I am concerned that his cardiomyopathy is ischemic. He remains volume overloaded.  - Continue Lasix 40 IV bid, he has had a good diuresis at this dose.  - Continue current Coreg and spironolactone. - Increase lisinopril to 2.5 mg bid.  - He will need LHC/RHC prior to discharge to assess for CAD and to measure filling pressures/CO. Will do either tomorrow or Wednesday most likely.  2. CAD: Mild elevation in TnI at admission, suspect demand ischemia due to volume overload. No history consistent with ACS. However, I am concerned for an underlying ischemic cardiomyopathy based on ECG and echo. As above, cath prior to discharge. Continue ASA and statin.  3. Aortic stenosis: I reviewed echo.  Moderate to severe AS with mean gradient around 25 but AVA 0.8 cm^2 calculated.  I will get a limited echo today with additional views for gradient measurement/valve area calculation.  Will see what coronaries look like on cath, may need low dose DSE to assess valve more closely but would not do this until coronary disease is defined.    Loralie Champagne 10/21/2012

## 2012-10-22 NOTE — Progress Notes (Signed)
[place], pa called back made aware of BP as recorded . Lasix dose held now

## 2012-10-22 NOTE — Progress Notes (Signed)
1830 d Sharrell Ku ordered fluids for BP management

## 2012-10-22 NOTE — Progress Notes (Signed)
Called by nursing regarding patient's low BP. Has been variable in the [id number] systolic over the last hour. He is asymptomatic. He is admitted with CHF and aortic stenosis. D/w Dr. Ron Parker. Will gently hydrate over the next several hours at 50cc/hr, hold next dose of Lasix. His lisinopril already has hold parameters for tonight. Nursing aware to follow BP closely. Dayna Dunn PA-C

## 2012-10-22 NOTE — Progress Notes (Signed)
[facility] PROGRESS NOTE  [name] [id number] DOB: Mar 21, 1941 DOA: 10/17/2012 PCP: [name], MD   This patient has been hospitalized since January when he had acute respiratory failure due to PNA/viral infection- he was trached and sent to [facility].  He was then sent to [place] home.  At [place] he noticed his weight was increasing and he noted an increase in swelling in his legs.  He was scheduled to see a cardiologist but became too SOB and was sent to the ER instead.     Assessment/Plan: 1.  CHF - appears to be new diagnosis, diuresing patient with lasix  changed to q 12, already has gotten a significant amount off; beta blocker, adding min dose of ACEi,, 2D echo shows decreased EF at 30-35% no h/o CHF previously but just got over a massive critical illness- appreciate cardiology- repeated echo for valve function and plan for cath -had mild increase of troponins  2.  Borderline DM - putting patient on med dose SSI for now, wasn't diabetic before but was started on decadron daily- called SNF Dr -[id number]- medication started 4/4- patient states he never saw the doctor and no return call for reason patient on  3.  OSA - ordered home CPAP  4.  Prostate CA -  has foley in place for several more weeks at a minimum according to patient and family.  5.  Hypokalemia- replete  6.  Hypernatremia- resolved   Code Status: full Family Communication: patient at bedside Disposition Plan:    Consultants:  cardiology  Procedures:  echo  Antibiotics:    HPI/Subjective: walking around in room, gave self bath For cath today   Objective: Filed Vitals:   10/21/12 2035 10/21/12 2119 10/21/12 2353 10/22/12 0505  BP: 110/77   109/64  Pulse: 93  101 90  Temp: 97.8 F (36.6 C)   97.6 F (36.4 C)  TempSrc: Oral   Oral  Resp: 18  16 16   Height:      Weight:    89.5 kg (197 lb 5 oz)  SpO2: 100% 96% 94% 96%    Intake/Output Summary (Last 24  hours) at 10/22/12 0825 Last data filed at 10/22/12 0506  Gross per 24 hour  Intake   2022 ml  Output   3600 ml  Net  -1578 ml   Filed Weights   10/20/12 0509 10/21/12 0538 10/22/12 0505  Weight: 90.311 kg (199 lb 1.6 oz) 89.449 kg (197 lb 3.2 oz) 89.5 kg (197 lb 5 oz)    Exam:   General:  A+Ox3, NAd  Cardiovascular: rrr  Respiratory: decreased BS b/l  Abdomen: +BS, soft, NT  Musculoskeletal: moves all 4 ext , decreasing edema  Data Reviewed: Basic Metabolic Panel:  Recent Labs Lab 10/18/12 0935 10/19/12 0514 10/19/12 1525 10/20/12 0629 10/21/12 0900 10/22/12 0530  NA 143 144 142 136 139 142  K 3.5 3.0* 3.5 4.7 4.4 3.4*  CL 102 103 101 99 95* 101  CO2 33* 32 33* 26 32 35*  GLUCOSE 154* 144* 129* 110* 185* 104*  BUN 18 18 20 22  26* 28*  CREATININE 0.82 0.76 0.86 0.72 0.73 0.77  CALCIUM 9.3 8.9 8.9 9.0 9.6 9.1  MG 1.5  --   --   --   --   --    Liver Function Tests: No results found for this basename: AST, ALT, ALKPHOS, BILITOT, PROT, ALBUMIN,  in the last 168 hours No results found for this basename: LIPASE, AMYLASE,  in the last 168 hours No results found for this basename: AMMONIA,  in the last 168 hours CBC:  Recent Labs Lab 10/19/12 0514 10/22/12 0530  WBC 6.8 5.9  HGB 9.6* 10.0*  HCT 31.6* 32.5*  MCV 89.8 90.3  PLT 197 203   Cardiac Enzymes:  Recent Labs Lab 10/17/12 2151 10/18/12 0244 10/18/12 0935  TROPONINI <0.30 0.36* 0.33*   BNP (last 3 results)  Recent Labs  10/19/12 1526  PROBNP 7876.0*   CBG:  Recent Labs Lab 10/21/12 0621 10/21/12 1139 10/21/12 1620 10/21/12 2120 10/22/12 0548  GLUCAP 102* 131* 165* 136* 108*    Recent Results (from the past 240 hour(s))  MRSA PCR SCREENING     Status: None   Collection Time    10/17/12  8:08 PM      Result Value Range Status   MRSA by PCR NEGATIVE  NEGATIVE Final   Comment:            The GeneXpert MRSA Assay (FDA     approved for NASAL specimens     only), is one  component of a     comprehensive MRSA colonization     surveillance program. It is not     intended to diagnose MRSA     infection nor to guide or     monitor treatment for     MRSA infections.     Studies: No results found.  Scheduled Meds: . albuterol  2.5 mg Nebulization TID  . aspirin  81 mg Oral Daily  . atorvastatin  20 mg Oral Daily  . carvedilol  3.125 mg Oral BID WC  . dexamethasone  4 mg Oral Q breakfast  . docusate sodium  100 mg Oral BID  . dutasteride  0.5 mg Oral QPM  . furosemide  40 mg Intravenous Q12H  . heparin  5,000 Units Subcutaneous Q8H  . insulin aspart  0-15 Units Subcutaneous TID WC  . ipratropium  0.5 mg Nebulization TID  . lisinopril  2.5 mg Oral BID  . potassium chloride  40 mEq Oral Once  . senna-docusate  1 tablet Oral BID  . sertraline  50 mg Oral QPM  . sodium chloride  3 mL Intravenous Q12H  . spironolactone  12.5 mg Oral Daily  . tamsulosin  0.4 mg Oral Q12H  . traZODone  25 mg Oral QHS   Continuous Infusions: . sodium chloride      Principal Problem:   CHF, acute Active Problems:   Borderline diabetes   Cardiomyopathy, ischemic   Aortic stenosis    Time spent: [place], [place] [facility] Pager [phone]. If 7PM-7AM, please contact night-coverage at [email or web address], password [facility] 10/22/2012, 8:25 AM  LOS: 5 days

## 2012-10-22 NOTE — Progress Notes (Signed)
CSW met with patient, wife and 2 daughters today to discuss d/c disposition. Patient has decided to return home rather than return to the SNF. Patient relates that he was very close to being d/c'd home from the SNF prior to this hospitalization.  Patient states "I've been away from home over 3 months- I want to go home."  Discussed possible need for Home Health and DME- information relayed to Janene Harvey, RNCM.  CSW left message for [place] regarding plan to return home. CSW spoke with pt's nurse Deneise Lever- to work with family regarding heart failure teaching per family's request.    No further CSW needs identified.  CSW will sign off. Lorie Phenix. [place], [place]

## 2012-10-23 ENCOUNTER — Inpatient Hospital Stay (HOSPITAL_COMMUNITY): Payer: BC Managed Care – PPO

## 2012-10-23 DIAGNOSIS — I359 Nonrheumatic aortic valve disorder, unspecified: Secondary | ICD-10-CM

## 2012-10-23 DIAGNOSIS — J9600 Acute respiratory failure, unspecified whether with hypoxia or hypercapnia: Secondary | ICD-10-CM

## 2012-10-23 DIAGNOSIS — Z01811 Encounter for preprocedural respiratory examination: Secondary | ICD-10-CM

## 2012-10-23 DIAGNOSIS — J449 Chronic obstructive pulmonary disease, unspecified: Secondary | ICD-10-CM

## 2012-10-23 DIAGNOSIS — J4489 Other specified chronic obstructive pulmonary disease: Secondary | ICD-10-CM

## 2012-10-23 DIAGNOSIS — J8410 Pulmonary fibrosis, unspecified: Secondary | ICD-10-CM

## 2012-10-23 DIAGNOSIS — I714 Abdominal aortic aneurysm, without rupture: Secondary | ICD-10-CM | POA: Diagnosis present

## 2012-10-23 DIAGNOSIS — I2510 Atherosclerotic heart disease of native coronary artery without angina pectoris: Secondary | ICD-10-CM

## 2012-10-23 DIAGNOSIS — I723 Aneurysm of iliac artery: Secondary | ICD-10-CM | POA: Diagnosis present

## 2012-10-23 DIAGNOSIS — Z01810 Encounter for preprocedural cardiovascular examination: Secondary | ICD-10-CM

## 2012-10-23 LAB — BASIC METABOLIC PANEL
BUN: 27 mg/dL — ABNORMAL HIGH (ref 6–23)
BUN: 26 mg/dL — ABNORMAL HIGH (ref 6–23)
Calcium: 8.8 mg/dL (ref 8.4–10.5)
Chloride: 100 mEq/L (ref 96–112)
GFR calc Af Amer: >90 mL/min (ref >90)
GFR calc Af Amer: >90 mL/min (ref >90)
GFR calc non Af Amer: 89 mL/min — ABNORMAL LOW (ref >90)
GFR calc non Af Amer: 89 mL/min — ABNORMAL LOW (ref >90)
Glucose, Bld: 94 mg/dL (ref 70–99)
Potassium: 3.7 mEq/L (ref 3.5–5.1)
Potassium: 3.9 mEq/L (ref 3.5–5.1)
Sodium: 137 mEq/L (ref 135–145)

## 2012-10-23 LAB — GLUCOSE, CAPILLARY
Glucose-Capillary: 105 mg/dL — ABNORMAL HIGH (ref 70–99)
Glucose-Capillary: 279 mg/dL — ABNORMAL HIGH (ref 70–99)
Glucose-Capillary: 113 mg/dL — ABNORMAL HIGH (ref 70–99)
Glucose-Capillary: 141 mg/dL — ABNORMAL HIGH (ref 70–99)

## 2012-10-23 LAB — CBC
HCT: 30.9 % — ABNORMAL LOW (ref 39.0–52.0)
Hemoglobin: 9.5 g/dL — ABNORMAL LOW (ref 13.0–17.0)
Hemoglobin: 9.6 g/dL — ABNORMAL LOW (ref 13.0–17.0)
MCH: 27 pg (ref 26.0–34.0)
MCHC: 30 g/dL (ref 30.0–36.0)
MCHC: 31.1 g/dL (ref 30.0–36.0)
Platelets: 196 10*3/uL (ref 150–400)
RDW: 18.5 % — ABNORMAL HIGH (ref 11.5–15.5)
WBC: 6.6 10*3/uL (ref 4.0–10.5)

## 2012-10-23 MED ORDER — SPIRONOLACTONE 25 MG PO TABS
25.0000 mg | ORAL_TABLET | Freq: Every day | ORAL | Status: DC
  Administered 2012-10-23 – 2012-10-24 (×2): 25 mg via ORAL
  Filled 2012-10-23 (×3): qty 1

## 2012-10-23 NOTE — Progress Notes (Signed)
Pre-op Cardiac Surgery  Carotid Findings:  There is no obvious evidence of right internal carotid artery stenosis. 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 Vertebral arteries are patent with antegrade flow.   Upper Extremity Right Left  Brachial Pressures 108-Triphasic 105-Triphasic  Radial Waveforms Triphasic Triphasic  Ulnar Waveforms Triphasic Triphasic  Palmar Arch (Allen's Test) Signal obliterates with both radial and ulnar compression. Signal obliterates with radial compression, decreases greater than 50% with ulnar compression.    Lower  Extremity Right Left  Dorsalis Pedis 116-Biphasic 127-Triphasic  Anterior Tibial    Posterior Tibial 127-Triphasic 131-Triphasic  Ankle/Brachial Indices 1.18 1.21    Findings:   Bilateral ABIs are within normal limits.   10/23/2012 12:56 PM Maudry Mayhew, RDMS, RDCS

## 2012-10-23 NOTE — Consult Note (Signed)
[facility] [place] CONSULT NOTE 10/23/2012 DOB: [id number] MRN : [id number]  CC:AAA and right iliac anuerysm Referring Physician: Dr. Marye Round  History of Present Illness: This is a 72 y.o. Male who was admitted 10-17-2012 secondary to CHF with a 21 lb. Weight gain.  He has a  recent history of severe illness involving respiratory failure and tracheostomy as well as DM, HTN, and COPD.  The patient had a cardiac catheterization 10-22-2012 resulting in  severe 3 vessel disease with moderate systolic dysfunction and moderate to severe aortic stenosis.   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  The CT scan was done on 07/21/2012.  We are being consulted to access and treat the aneurysm.  Past Medical History  Diagnosis Date  . Hypertension   . Hyperlipidemia   . OSA (obstructive sleep apnea)     on cpap  . Prediabetes   . CHF (congestive heart failure) 07/2012; 10/17/2012  . COPD (chronic obstructive pulmonary disease)   . Pneumonia 07/2012    "H1N1; ARDS; double pneumonia" (10/17/2012)  . Shortness of breath     "all the time; related to CHF" (10/17/2012)  . Diabetes mellitus without complication     "prediabetic; been getting insulin while in SNF" (10/17/2012)  . History of blood transfusion     "lots since January" (10/17/2012)  . Anxiety     Past Surgical History  Procedure Laterality Date  . Transurethral microwave therapy  10/15/2012  . Nasal fracture surgery  1970's  . Tracheostomy  07/2012  . Tracheostomy closure  08/2012  . Anal fissure repair  2008     ROS: [x]  Positive  [ ]  Denies    General: [ ]  Weight loss, [ ]  Fever, [ ]  chills Neurologic: [ ]  Dizziness, [ ]  Blackouts, [ ]  Seizure [ ]  Stroke, [ ]  "Mini stroke", [ ]  Slurred speech, [ ]  Temporary blindness; [ ]  weakness in arms or legs, [ ]  Hoarseness Cardiac: [x ] Chest  pain/pressure, [ ]  Shortness of breath at rest [x ] Shortness of breath with exertion, [ ]  Atrial fibrillation or irregular heartbeat Vascular: [ ]  Pain in legs with walking, [ ]  Pain in legs at rest, [ ]  Pain in legs at night,  [ ]  Non-healing ulcer, [ ]  Blood clot in vein/DVT,   Pulmonary: [ ]  Home oxygen, [x ] Productive cough, [ ]  Coughing up blood, [ ]  Asthma,  [x ] Wheezing Musculoskeletal:  [ ]  Arthritis, [ ]  Low back pain, [ ]  Joint pain Hematologic: [ ]  Easy Bruising, [ ]  Anemia; [ ]  Hepatitis Gastrointestinal: [ ]  Blood in stool, [ ]  Gastroesophageal Reflux/heartburn, [ ]  Trouble swallowing Urinary: [ ]  chronic Kidney disease, [ ]  on HD - [ ]  MWF or [ ]  TTHS, [ ]  Burning with urination, [ ]  Difficulty urinating Skin: [ ]  Rashes, [ ]  Wounds Psychological: [ ]  Anxiety, [ ]  Depression  Social History History  Substance Use Topics  . Smoking status: Former Smoker -- 2.00 packs/day for 58 years    Types: Cigarettes    Quit date: 07/20/2012  . Smokeless tobacco: Never Used  . Alcohol Use: No     Comment: 10/17/2012 "years since I've had a drink; never had problem with it"    Family History History reviewed. No pertinent family history.  Allergies  Allergen Reactions  . Cephalosporins Rash  Current Facility-Administered Medications  Medication Dose Route Frequency Provider Last Rate Last Dose  . 0.9 %  sodium chloride infusion  250 mL Intravenous PRN Etta Quill, DO      . 0.9 %  sodium chloride infusion   Intravenous Continuous Larey Dresser, MD      . acetaminophen (TYLENOL) tablet 650 mg  650 mg Oral Q4H PRN Etta Quill, DO      . albuterol (PROVENTIL) (5 MG/ML) 0.5% nebulizer solution 2.5 mg  2.5 mg Nebulization Q2H PRN Geradine Girt, DO   2.5 mg at 10/22/12 1812  . albuterol (PROVENTIL) (5 MG/ML) 0.5% nebulizer solution 2.5 mg  2.5 mg Nebulization TID Geradine Girt, DO   2.5 mg at 10/23/12 0741  . aspirin chewable tablet 81 mg  81 mg Oral Daily Larey Dresser, MD   81 mg at 10/23/12 1008  . atorvastatin (LIPITOR) tablet 80 mg  80 mg Oral Daily Larey Dresser, MD   80 mg at 10/23/12 1010  . carvedilol (COREG) tablet 3.125 mg  3.125 mg Oral BID WC Larey Dresser, MD   3.125 mg at 10/23/12 [id number]  . clonazePAM (KLONOPIN) tablet 0.5 mg  0.5 mg Oral TID PRN Etta Quill, DO      . docusate sodium (COLACE) capsule 100 mg  100 mg Oral BID Etta Quill, DO   100 mg at 10/23/12 1009  . dutasteride (AVODART) capsule 0.5 mg  0.5 mg Oral QPM Etta Quill, DO   0.5 mg at 10/22/12 1741  . furosemide (LASIX) injection 40 mg  40 mg Intravenous Q12H Satira Sark, MD   40 mg at 10/23/12 [id number]  . guaiFENesin-dextromethorphan (ROBITUSSIN DM) 100-10 MG/5ML syrup 5 mL  5 mL Oral Q4H PRN Etta Quill, DO   5 mL at 10/18/12 2018  . heparin injection 5,000 Units  5,000 Units Subcutaneous Q8H Etta Quill, DO   5,000 Units at 10/23/12 [id number]  . insulin aspart (novoLOG) injection 0-15 Units  0-15 Units Subcutaneous TID WC Etta Quill, DO   3 Units at 10/22/12 1807  . ipratropium (ATROVENT) nebulizer solution 0.5 mg  0.5 mg Nebulization TID Geradine Girt, DO   0.5 mg at 10/23/12 0742  . lactulose (CHRONULAC) 10 GM/15ML solution 20 g  20 g Oral Daily PRN Etta Quill, DO      . ondansetron [facility]) injection 4 mg  4 mg Intravenous Q6H PRN Etta Quill, DO      . senna-docusate (Senokot-S) tablet 1 tablet  1 tablet Oral BID Etta Quill, DO   1 tablet at 10/23/12 1010  . sertraline (ZOLOFT) tablet 50 mg  50 mg Oral QPM Etta Quill, DO   50 mg at 10/22/12 1809  . sodium chloride 0.9 % injection 3 mL  3 mL Intravenous Q12H Etta Quill, DO   3 mL at 10/23/12 1011  . sodium chloride 0.9 % injection 3 mL  3 mL Intravenous PRN Etta Quill, DO      . spironolactone (ALDACTONE) tablet 25 mg  25 mg Oral Daily Larey Dresser, MD   25 mg at 10/23/12 1012  . tamsulosin (FLOMAX) capsule 0.4 mg  0.4 mg Oral Q12H Etta Quill, DO    0.4 mg at 10/23/12 1012  . traZODone (DESYREL) tablet 25 mg  25 mg Oral QHS Etta Quill, DO   25 mg at 10/22/12 2138  Imaging: No results found.  Significant Diagnostic Studies: CBC Lab Results  Component Value Date   WBC 6.2 10/23/2012   HGB 9.5* 10/23/2012   HCT 31.7* 10/23/2012   MCV 90.1 10/23/2012   PLT 196 10/23/2012    BMET    Component Value Date/Time   NA 140 10/23/2012 0620   K 3.7 10/23/2012 0620   CL 103 10/23/2012 0620   CO2 30 10/23/2012 0620   GLUCOSE 94 10/23/2012 0620   BUN 27* 10/23/2012 0620   CREATININE 0.79 10/23/2012 0620   CALCIUM 8.8 10/23/2012 0620   GFRNONAA 89* 10/23/2012 0620   GFRAA >90 10/23/2012 0620    COAG Lab Results  Component Value Date   INR 1.14 08/05/2012   INR 1.38 07/29/2012   INR 1.34 07/20/2012   No results found for this basename: PTT     Physical Examination BP Readings from Last 3 Encounters:  10/23/12 100/62  10/23/12 100/62  08/12/12 147/80   Temp Readings from Last 3 Encounters:  10/23/12 98 F (36.7 C) Oral  10/23/12 98 F (36.7 C) Oral  08/12/12 100 F (37.8 C) Oral   SpO2 Readings from Last 3 Encounters:  10/23/12 98%  10/23/12 98%  08/12/12 95%   Pulse Readings from Last 3 Encounters:  10/23/12 90  10/23/12 90  08/12/12 114    General:  WDWN in NAD Gait: Normal HENT: WNL Eyes: Pupils equal Pulmonary: normal non-labored breathing , without Rales, positive rhonchi,  wheezing Cardiac: RRR, without  Murmurs, rubs or gallops; No carotid bruits Abdomen: soft, NT, no palpable/pulsitile masses Skin: no rashes, ulcers noted Vascular Exam/Pulses:Radial,femoral,DP/PT palpable pulses bilateral  Extremities without ischemic changes, no Gangrene , no cellulitis; no open wounds;  Musculoskeletal: no muscle wasting or atrophy  Neurologic: A&O X 3; Appropriate Affect ;  SENSATION: normal; MOTOR FUNCTION: Pt has good and equal strength in all extremities - 5/5 Speech is fluent/normal  Non-Invasive Vascular  Imaging:  Pelvic and abdominal CT was reviewed  which showed an Abdominal aortic aneurysm measuring 6.4 cm AP diameter. 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   Carotid Findings: There is no obvious evidence of right internal carotid artery stenosis. 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 Vertebral arteries are patent with antegrade flow.  Upper Extremity  Right  Left   Brachial Pressures  108-Triphasic  105-Triphasic   Radial Waveforms  Triphasic  Triphasic   Ulnar Waveforms  Triphasic  Triphasic   Palmar Arch (Allen's Test)  Signal obliterates with both radial and ulnar compression.  Signal obliterates with radial compression, decreases greater than 50% with ulnar compression.    Lower Extremity  Right  Left   Dorsalis Pedis  116-Biphasic  127-Triphasic   Anterior Tibial     Posterior Tibial  127-Triphasic  131-Triphasic   Ankle/Brachial Indices  1.18  1.21   Findings:  Bilateral ABIs are within normal limits.     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 I will discuss this case with Dr. Kellie Simmering.  COLLINS, EMMA [facility] PA-C   Agree with above assessment Patient has large abdominal aortic aneurysm which measures 6.8 cm in January of this year as well as 5 cm right common iliac aneurysm Unfortunately does not appear to be a candidate for stent grafting because of short  angulated neck Recommend proceeding with coronary artery bypass grafting and aortic valve replacement and will then consider open repair of his aortic and right common iliac aneurysm in 2-3 months. Discussed this with patient and his wife and daughter and they understand that there is a risk of rupture and death in the interim but that cardiac issues need  to be dealt with initially

## 2012-10-23 NOTE — Consult Note (Signed)
PULMONARY  / CRITICAL CARE MEDICINE  Name: [name] MRN: [id number] DOB: [birth year]    ADMISSION DATE:  10/17/2012 CONSULTATION DATE:  10/23/12  REFERRING MD :  Dr.  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  PCCM asked to evaluate for pre-op pulmonary clearance.    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 4/21 - ECHO>>EF 35-40%, moderate AS 4/22 - Cardiac Cath >>Severe 3 vessel disease (LAD 95%, LCx 70-80%, RCA 90%)  with moderate systolic dysfunction and moderate to severe aortic stenosis  4/23 - PFT with severe obstructive, severe restrictive disease & severely reduced DLCO.   4/23 - Pre-CABG Doppler >>R ICA without hemodynamically significant stenosis.  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  LINES / TUBES:   CULTURES: 4/17 MRSA PCR>>>neg  ANTIBIOTICS:   HISTORY OF PRESENT ILLNESS:  72 y/o, SNF Resident ([facility]), former smoker with approx 150 pk yr hx (up to 2-3ppd since age 48) with PMH of COPD, OSA on CPAP and recent admit for ARDS s/p trach secondary to H1N1 in 07/2012 admitted 417 with worsening shortness of breath associated with weight gain.  Patient notes he was discharged from [facility] on 08/12/12 to [facility], where trach was removed, and then further went to [facility] for continued rehab efforts.  He began noticing increased weight gain -up to 5lbs per day, worsening shortness of breath, orthopnea and decreased activity tolerance. Underwent ECHO which demonstrated EF of 35-40% & moderate AS.  Cardiac cath 4/22 with severe 3 vessel disease (LAD 95%, LCx 70-80%, RCA 90%)  with moderate systolic dysfunction and moderate to severe aortic stenosis.  4/23  PFTs reveal severe obstructive / restrictive disease & severely reduced DLCO.  CT ABD 07/21/12 notes Abdominal aortic aneurysm measuring 6.4 cm AP diameter. 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     Prior to admit, patient had been weaned down to one liter of O2 PRN, was walking with a walker.  Has not smoked since prior to admit in January.    PAST MEDICAL HISTORY :  Past Medical History  Diagnosis Date  . Hypertension   . Hyperlipidemia   . OSA (obstructive sleep apnea)     on cpap  . Prediabetes   . CHF (congestive heart failure) 07/2012; 10/17/2012  . COPD (chronic obstructive pulmonary disease)   . Pneumonia 07/2012    "H1N1; ARDS; double pneumonia" (10/17/2012)  . Shortness of breath     "all the time; related to CHF" (10/17/2012)  . Diabetes mellitus without complication     "prediabetic; been getting insulin while in SNF" (10/17/2012)  . History of blood transfusion     "lots since January" (10/17/2012)  . Anxiety    Past Surgical History  Procedure Laterality Date  . Transurethral microwave therapy  10/15/2012  . Nasal fracture surgery  1970's  . Tracheostomy  07/2012  . Tracheostomy closure  08/2012  . Anal fissure repair  2008   Prior to Admission medications   Medication Sig Start Date End Date Taking? Authorizing Provider  acetaminophen (TYLENOL) 325 MG tablet Take 650 mg by mouth every 8 (eight) hours  as needed for pain.   Yes Historical Provider, MD  atorvastatin (LIPITOR) 20 MG tablet Take 20 mg by mouth daily.   Yes Historical Provider, MD  clonazePAM (KLONOPIN) 0.5 MG tablet Take 0.5 mg by mouth 3 (three) times daily as needed for anxiety.   Yes Historical Provider, MD  dexamethasone (DECADRON) 4 MG tablet Take 4 mg by mouth daily with breakfast.   Yes Historical Provider, MD  docusate sodium (COLACE) 100 MG capsule Take 100 mg by mouth 2 (two) times daily.   Yes Historical Provider, MD  dutasteride (AVODART) 0.5 MG capsule Take 0.5 mg by mouth  every evening.   Yes Historical Provider, MD  guaifenesin (GERI-TUSSIN) 100 MG/5ML syrup Take 300 mg by mouth every 4 (four) hours as needed for cough.   Yes Historical Provider, MD  insulin regular (NOVOLIN R,HUMULIN R) 100 units/mL injection Inject 0-10 Units into the skin every 6 (six) hours.   Yes Historical Provider, MD  ipratropium-albuterol (DUONEB) 0.5-2.5 (3) MG/3ML SOLN Take 3 mLs by nebulization every 6 (six) hours.   Yes Historical Provider, MD  lactulose (CHRONULAC) 10 GM/15ML solution Take 20 g by mouth daily as needed (constipation).   Yes Historical Provider, MD  metoprolol tartrate (LOPRESSOR) 25 MG tablet Take 12.5 mg by mouth 2 (two) times daily.   Yes Historical Provider, MD  senna-docusate (SENOKOT-S) 8.6-50 MG per tablet Take 1 tablet by mouth 2 (two) times daily.   Yes Historical Provider, MD  sertraline (ZOLOFT) 50 MG tablet Take 50 mg by mouth every evening.   Yes Historical Provider, MD  tamsulosin (FLOMAX) 0.4 MG CAPS Take 0.4 mg by mouth every 12 (twelve) hours.   Yes Historical Provider, MD  torsemide (DEMADEX) 20 MG tablet Take 20 mg by mouth 2 (two) times daily.   Yes Historical Provider, MD  traZODone (DESYREL) 50 MG tablet Take 25 mg by mouth at bedtime.   Yes Historical Provider, MD   Allergies  Allergen Reactions  . Cephalosporins Rash    FAMILY HISTORY:  History reviewed. No pertinent family history.  SOCIAL HISTORY:  reports that he quit smoking about 3 months ago. His smoking use included Cigarettes. He has a 116 pack-year smoking history. He has never used smokeless tobacco. He reports that he does not drink alcohol or use illicit drugs.  REVIEW OF SYSTEMS:   Constitutional: Negative for fever, chills, weight loss, malaise/fatigue and diaphoresis.  HENT: Negative for hearing loss, ear pain, nosebleeds, congestion, sore throat, neck pain, tinnitus and ear discharge.   Eyes: Negative for blurred vision, double vision, photophobia, pain, discharge and  redness.  Respiratory: Negative for cough, hemoptysis, sputum production, wheezing and stridor.  Reports shortness of breath with activity.   Cardiovascular: Negative for chest pain, palpitations, claudication, and PND.  Reports difficulty lying flat at night, increased LE swelling Gastrointestinal: Negative for heartburn, nausea, vomiting, abdominal pain, diarrhea, constipation, blood in stool and melena.  Genitourinary: Negative for dysuria, urgency, frequency, hematuria and flank pain.  Musculoskeletal: Negative for myalgias, back pain, joint pain and falls.  Skin: Negative for itching and rash.  Neurological: Negative for dizziness, tingling, tremors, sensory change, speech change, focal weakness, seizures, loss of consciousness, weakness and headaches.  Endo/Heme/Allergies: Negative for environmental allergies and polydipsia. Does not bruise/bleed easily.  SUBJECTIVE: "ready for surgery"  VITAL SIGNS: Temp:  [98 F (36.7 C)-98.1 F (36.7 C)] 98 F (36.7 C) (04/23 0553) Pulse Rate:  [74-94] 90 (04/23 1017) Resp:  [18-20] 20 (04/23 1017) BP: (70-100)/(50-72) 100/62  mmHg (04/23 1017) SpO2:  [93 %-99 %] 98 % (04/23 1017) FiO2 (%):  [28 %] 28 % (04/22 1814) Weight:  [201 lb 11.5 oz (91.5 kg)-202 lb 8 oz (91.853 kg)] 202 lb 8 oz (91.853 kg) (04/23 0840)  PHYSICAL EXAMINATION: General:  Chronically ill in NAD Neuro:  AAOx4, MAE3 HEENT:  Mm pink/moist, pupils =R Cardiovascular:  s1s2 rrr, no m/r/g Lungs:  resp's even/non-labored, lungs bilaterally diminished but clear Abdomen:  Obese/soft, bsx4 active Musculoskeletal:  No acute deformities Skin:  Multiple scattered bruises   Recent Labs Lab 10/22/12 0530 10/22/12 1547 10/23/12 0620 10/23/12 1047  NA 142  --  140 137  K 3.4*  --  3.7 3.9  CL 101  --  103 100  CO2 35*  --  30 28  BUN 28*  --  27* 26*  CREATININE 0.77 0.70 0.79 0.79  GLUCOSE 104*  --  94 280*    Recent Labs Lab 10/22/12 1547 10/23/12 0620 10/23/12 1047   HGB 10.8* 9.5* 9.6*  HCT 35.5* 31.7* 30.9*  WBC 6.2 6.2 6.6  PLT 158 196 177    ASSESSMENT / PLAN:  COPD OSA Hx of H1N1 / ARDS s/p Trach  72 y/o WM with extensive smoking history, obstructive sleep apnea with compliance of CPAP for last 15 years, and recent lung injury secondary to H1N1.  Decannulated at [facility] prior to discharge.  PFT's demonstrate mixed obstructive and restrictive disease as well as reduced DLCO.  Patient is high risk surgical candidate.    Plan: -lasix to negative balance as tolerated per Cardiology -continue TID nebulized A/A + PRN albuterol -post surgery, consider add LABA/ICS -pre-op IS teaching/pulmonary hygiene encouraged    Noe Gens, NP-C [facility] Pgr: [phone] or [phone]   PCCM ATTENDING: I have interviewed and examined the patient and reviewed the database. I have formulated the assessment and plan as reflected in the note above with amendments made by me and have discussed with Dr Roxan Hockey  My thoughts: 1) he has moderate to severe pulmonary impairment with mixed obst and rest physiology due to COPD and post-infectious (H1N1) pulmonary fibrosis 2) He has recovered to a remarkable level of function after the devastating illness in Jan of this year - a testament to his motivation/willpower 3) His presenting symptoms appear to be cardiac in origin - CHF superimposed on his underlying lung disease 4) Per discussion with Dr Roxan Hockey, there are no good surgical alternatives to management of his CAD and AS 5) His risk of peri-operative complications, most significantly prolonged vent dependence, is likely moderately elevated but not prohibitive 6) There are no interventions that can be undertaken pre-op to reduce risk other than optimize his cardiovascular/CHF status   REC: 1) continue scheduled BDs and supplemental O2 peri-operatively 2) I have ordered repeat CXR for AM 4/24 3) Cards to optimize mgmt of  CHF 4) Proceed with surgery as planned (4/26) 5) PCCM will see peri-operatively to assist with ICU mgmt  Merton Border, MD;  PCCM service; Mobile [phone]   10/23/2012, 3:20 PM

## 2012-10-23 NOTE — Progress Notes (Signed)
Patient ID: [name], male   DOB: 1941-01-31, 72 y.o.   MRN: [id number]    SUBJECTIVE: Feeling ok this morning.  No chest pain.  Breathing has been better.  SBP still soft.   Marland Kitchen albuterol  2.5 mg Nebulization TID  . aspirin  81 mg Oral Daily  . atorvastatin  80 mg Oral Daily  . carvedilol  3.125 mg Oral BID WC  . dexamethasone  4 mg Oral Q breakfast  . docusate sodium  100 mg Oral BID  . dutasteride  0.5 mg Oral QPM  . furosemide  40 mg Intravenous Q12H  . heparin  5,000 Units Subcutaneous Q8H  . insulin aspart  0-15 Units Subcutaneous TID WC  . ipratropium  0.5 mg Nebulization TID  . lisinopril  2.5 mg Oral BID  . senna-docusate  1 tablet Oral BID  . sertraline  50 mg Oral QPM  . sodium chloride  3 mL Intravenous Q12H  . spironolactone  25 mg Oral Daily  . tamsulosin  0.4 mg Oral Q12H  . traZODone  25 mg Oral QHS      Filed Vitals:   10/22/12 2042 10/22/12 2128 10/22/12 2257 10/23/12 0553  BP:  92/68  97/61  Pulse:  94 91 92  Temp:  98.1 F (36.7 C)  98 F (36.7 C)  TempSrc:  Oral  Oral  Resp:  20 18 20   Height:      Weight:    201 lb 11.5 oz (91.5 kg)  SpO2: 93% 95% 94% 99%    Intake/Output Summary (Last 24 hours) at 10/23/12 0656 Last data filed at 10/23/12 0600  Gross per 24 hour  Intake 1495.83 ml  Output   2400 ml  Net -904.17 ml    LABS: Basic Metabolic Panel:  Recent Labs  10/21/12 0900 10/22/12 0530 10/22/12 1547  NA 139 142  --   K 4.4 3.4*  --   CL 95* 101  --   CO2 32 35*  --   GLUCOSE 185* 104*  --   BUN 26* 28*  --   CREATININE 0.73 0.77 0.70  CALCIUM 9.6 9.1  --    Liver Function Tests: No results found for this basename: AST, ALT, ALKPHOS, BILITOT, PROT, ALBUMIN,  in the last 72 hours No results found for this basename: LIPASE, AMYLASE,  in the last 72 hours CBC:  Recent Labs  10/22/12 0530 10/22/12 1547  WBC 5.9 6.2  HGB 10.0* 10.8*  HCT 32.5* 35.5*  MCV 90.3 90.8  PLT 203 158   Cardiac Enzymes: No results found  for this basename: CKTOTAL, CKMB, CKMBINDEX, TROPONINI,  in the last 72 hours BNP: No components found with this basename: POCBNP,  D-Dimer: No results found for this basename: DDIMER,  in the last 72 hours Hemoglobin A1C: No results found for this basename: HGBA1C,  in the last 72 hours Fasting Lipid Panel: No results found for this basename: CHOL, HDL, LDLCALC, TRIG, CHOLHDL, LDLDIRECT,  in the last 72 hours Thyroid Function Tests: No results found for this basename: TSH, T4TOTAL, FREET3, T3FREE, THYROIDAB,  in the last 72 hours Anemia Panel: No results found for this basename: VITAMINB12, FOLATE, FERRITIN, TIBC, IRON, RETICCTPCT,  in the last 72 hours  RADIOLOGY: Dg Chest 2 View  10/18/2012  *RADIOLOGY REPORT*  Clinical Data: Shortness of breath, cough and congestion.  CHEST - 2 VIEW  Comparison: 10/17/2012 and CT chest 08/11/2012.  Findings: Trachea is midline.  Heart is at the upper  limits of normal in size.  There is diffuse mixed interstitial and airspace disease with linear scarring at the left lung base.  Small bilateral pleural effusions.  Findings are superimposed on emphysema.  IMPRESSION: Persistent congestive heart failure, possibly minimally improved from 10/17/2012.   Original Report Authenticated By: Lorin Picket, M.D.     PHYSICAL EXAM General: NAD Neck: JVP 10 cm, no thyromegaly or thyroid nodule.  Lungs: Clear to auscultation bilaterally with normal respiratory effort. CV: Nondisplaced PMI.  Heart regular S1/S2, no [id number], 2/6 systolic murmur RUSB, S2 heard clearly.  1+ ankle edema on right, 1+ edema to knee on left.  No carotid bruit.   Abdomen: Soft, nontender, no hepatosplenomegaly, no distention.  Neurologic: Alert and oriented x 3.  Psych: Normal affect. Extremities: No clubbing or cyanosis.   TELEMETRY: Reviewed telemetry pt in NSR  ASSESSMENT AND PLAN: 72 yo with history of recent severe illness involving respiratory failure and tracheostomy as well as DM,  HTN, and COPD presented to the ER at [facility] with acute systolic CHF and was transferred up to [facility].  1. CHF: Acute systolic CHF with EF [id number] and anteroseptal wall motion abnormality. Onset of symptoms was gradual, no event that seems consistent with ACS. He has an ischemic cardiomyopathy based on cath yesterday with severe 3VD.  His filling pressures were still mildly elevated yesterday on RHC. He remains volume overloaded.  - Continue Lasix 40 IV bid, he has had a reasonable diuresis at this dose.  - Continue current Coreg and lisinopril, will not titrate up with soft blood pressure.  - He is on spironolactone 25 mg daily.  2. CAD: Severe 3 vessel disease, best approached via CABG.  He will be higher risk for surgery given comorbidities but he actually was planning on going home from SNF prior to this admission.  He also has indwelling foley during ongoing treatment of prostate cancer.  Awaiting CVTS evaluation.  Continue ASA 81 and atorvastatin 80.  If he is deemed too high risk for surgery, the proximal LAD, proximal RCA, and mid LCx could be approached percutaneously.  3. Aortic stenosis: Moderate to severe AS, moderate by mean gradient but severe by calculated valve area (cannot rule out low gradient severe AS).  If he has CABG, he should also have bioprosthetic AVR.  4. Aortic aneurysm: Patient has right iliac aneurysm and AAA measuring up to 6.4 cm on noncontrast CT from back in 1/14.  Will have VVS evaluation.   Loralie Champagne 10/23/2012

## 2012-10-23 NOTE — Consult Note (Signed)
Reason for Consult:3 vessel CAD, moderately severe AS Referring Physician: Dr. Kathalene Frames is an 72 y.o. male.  HPI: 72 yo male with a complex recent medical history who presented with swelling in his feet and legs and shortness of breath.  Mr. Straughan is a 72 yo male with no prior history of CAD or aortic stenosis. He does have a history of heavy tobacco abuse and COPD, as well as sleep apnea and "borderline" type II DM. He was in his usual state of health and still working until he became ill in January of this year. He was admitted to [facility] in January with H1N1 and developed pneumonia and ARDS. He was intubated for about 2 weeks. 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 He required a percutaneous drain for a parapneumonic effusion on the left. He required a tracheostomy and ultimately went to [facility] where he was weaned and decannulated.   After a few weeks at [facility] he was sent to [facility] for further rehabilitation prio to going home. He was dc'ed from [place] on April 4th. He says that he started gaining weight (21 pounds in 2 weeks) while at [facility]. He was sent to [facility] and then transferred to [facility] for treatment of class IV CHF. He improved symptomatically with diuresis. An echocardiogram on 4/18 showed a dilated hypokinetic LV with an EF of 30-40% and severe AS with a valve area of 0.63. A repeat echo on 4/22 showed similar findings with mean gradient of 33 and peak of 50 mm Hg c/w moderate stenosis. 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  Past Medical History  Diagnosis Date  . Hypertension   . Hyperlipidemia   . OSA (obstructive sleep apnea)     on cpap  . Prediabetes   . CHF (congestive heart failure) 07/2012; 10/17/2012  . COPD (chronic obstructive pulmonary disease)   . Pneumonia 07/2012    "H1N1; ARDS; double pneumonia" (10/17/2012)  . Shortness of breath     "all the time;  related to CHF" (10/17/2012)  . Diabetes mellitus without complication     "prediabetic; been getting insulin while in SNF" (10/17/2012)  . History of blood transfusion     "lots since January" (10/17/2012)  . Anxiety     Past Surgical History  Procedure Laterality Date  . Transurethral microwave therapy  10/15/2012  . Nasal fracture surgery  1970's  . Tracheostomy  07/2012  . Tracheostomy closure  08/2012  . Anal fissure repair  2008    History reviewed. No pertinent family history.  Social History:  reports that he quit smoking about 3 months ago. His smoking use included Cigarettes. He has a 116 pack-year smoking history. He has never used smokeless tobacco. He reports that he does not drink alcohol or use illicit drugs.  Allergies:  Allergies  Allergen Reactions  . Cephalosporins Rash    Medications:  Prior to Admission:  Prescriptions prior to admission  Medication Sig Dispense Refill  . acetaminophen (TYLENOL) 325 MG tablet Take 650 mg by mouth every 8 (eight) hours as needed for pain.      Marland Kitchen atorvastatin (LIPITOR) 20 MG tablet Take 20 mg by mouth daily.      . clonazePAM (KLONOPIN) 0.5 MG tablet Take 0.5 mg by mouth 3 (three) times daily as needed for anxiety.      Marland Kitchen dexamethasone (DECADRON) 4 MG tablet Take 4  mg by mouth daily with breakfast.      . docusate sodium (COLACE) 100 MG capsule Take 100 mg by mouth 2 (two) times daily.      Marland Kitchen dutasteride (AVODART) 0.5 MG capsule Take 0.5 mg by mouth every evening.      Marland Kitchen guaifenesin (GERI-TUSSIN) 100 MG/5ML syrup Take 300 mg by mouth every 4 (four) hours as needed for cough.      . insulin regular (NOVOLIN R,HUMULIN R) 100 units/mL injection Inject 0-10 Units into the skin every 6 (six) hours.      Marland Kitchen ipratropium-albuterol (DUONEB) 0.5-2.5 (3) MG/3ML SOLN Take 3 mLs by nebulization every 6 (six) hours.      Marland Kitchen lactulose (CHRONULAC) 10 GM/15ML solution Take 20 g by mouth daily as needed (constipation).      . metoprolol tartrate  (LOPRESSOR) 25 MG tablet Take 12.5 mg by mouth 2 (two) times daily.      Marland Kitchen senna-docusate (SENOKOT-S) 8.6-50 MG per tablet Take 1 tablet by mouth 2 (two) times daily.      . sertraline (ZOLOFT) 50 MG tablet Take 50 mg by mouth every evening.      . tamsulosin (FLOMAX) 0.4 MG CAPS Take 0.4 mg by mouth every 12 (twelve) hours.      . torsemide (DEMADEX) 20 MG tablet Take 20 mg by mouth 2 (two) times daily.      . traZODone (DESYREL) 50 MG tablet Take 25 mg by mouth at bedtime.        Results for orders placed during the hospital encounter of 10/17/12 (from the past 48 hour(s))  GLUCOSE, CAPILLARY     Status: Abnormal   Collection Time    10/21/12  4:20 PM      Result Value Range   Glucose-Capillary 165 (*) 70 - 99 mg/dL   Comment 1 Notify RN    GLUCOSE, CAPILLARY     Status: Abnormal   Collection Time    10/21/12  9:20 PM      Result Value Range   Glucose-Capillary 136 (*) 70 - 99 mg/dL   Comment 1 Documented in Chart     Comment 2 Notify RN    BASIC METABOLIC PANEL     Status: Abnormal   Collection Time    10/22/12  5:30 AM      Result Value Range   Sodium 142  135 - 145 mEq/L   Potassium 3.4 (*) 3.5 - 5.1 mEq/L   Comment: DELTA CHECK NOTED   Chloride 101  96 - 112 mEq/L   CO2 35 (*) 19 - 32 mEq/L   Glucose, Bld 104 (*) 70 - 99 mg/dL   BUN 28 (*) 6 - 23 mg/dL   Creatinine, Ser 0.77  0.50 - 1.35 mg/dL   Calcium 9.1  8.4 - 10.5 mg/dL   GFR calc non Af Amer 90 (*) >90 mL/min   GFR calc Af Amer >90  >90 mL/min   Comment:            The eGFR has been calculated     using the CKD EPI equation.     This calculation has not been     validated in all clinical     situations.     eGFR's persistently     <90 mL/min signify     possible Chronic Kidney Disease.  CBC     Status: Abnormal   Collection Time    10/22/12  5:30 AM      Result  Value Range   WBC 5.9  4.0 - 10.5 K/uL   RBC 3.60 (*) 4.22 - 5.81 MIL/uL   Hemoglobin 10.0 (*) 13.0 - 17.0 g/dL   HCT 32.5 (*) 39.0 - 52.0 %    MCV 90.3  78.0 - 100.0 fL   MCH 27.8  26.0 - 34.0 pg   MCHC 30.8  30.0 - 36.0 g/dL   RDW 18.5 (*) 11.5 - 15.5 %   Platelets 203  150 - 400 K/uL  GLUCOSE, CAPILLARY     Status: Abnormal   Collection Time    10/22/12  5:48 AM      Result Value Range   Glucose-Capillary 108 (*) 70 - 99 mg/dL   Comment 1 Notify RN    GLUCOSE, CAPILLARY     Status: Abnormal   Collection Time    10/22/12 11:06 AM      Result Value Range   Glucose-Capillary 148 (*) 70 - 99 mg/dL   Comment 1 Notify RN    POCT I-STAT 3, BLOOD GAS (G3P V)     Status: Abnormal   Collection Time    10/22/12  1:05 PM      Result Value Range   pH, Ven 7.379 (*) 7.250 - 7.300   pCO2, Ven 55.3 (*) 45.0 - 50.0 mmHg   pO2, Ven 37.0  30.0 - 45.0 mmHg   Bicarbonate 32.6 (*) 20.0 - 24.0 mEq/L   TCO2 34  0 - 100 mmol/L   O2 Saturation 68.0     Acid-Base Excess 6.0 (*) 0.0 - 2.0 mmol/L   Sample type VENOUS     Comment NOTIFIED PHYSICIAN    POCT I-STAT 3, BLOOD GAS (G3+)     Status: Abnormal   Collection Time    10/22/12  1:17 PM      Result Value Range   pH, Arterial 7.428  7.350 - 7.450   pCO2 arterial 47.9 (*) 35.0 - 45.0 mmHg   pO2, Arterial 98.0  80.0 - 100.0 mmHg   Bicarbonate 31.7 (*) 20.0 - 24.0 mEq/L   TCO2 33  0 - 100 mmol/L   O2 Saturation 98.0     Acid-Base Excess 6.0 (*) 0.0 - 2.0 mmol/L   Sample type ARTERIAL    GLUCOSE, CAPILLARY     Status: Abnormal   Collection Time    10/22/12  1:56 PM      Result Value Range   Glucose-Capillary 123 (*) 70 - 99 mg/dL  CBC     Status: Abnormal   Collection Time    10/22/12  3:47 PM      Result Value Range   WBC 6.2  4.0 - 10.5 K/uL   RBC 3.91 (*) 4.22 - 5.81 MIL/uL   Hemoglobin 10.8 (*) 13.0 - 17.0 g/dL   HCT 35.5 (*) 39.0 - 52.0 %   MCV 90.8  78.0 - 100.0 fL   MCH 27.6  26.0 - 34.0 pg   MCHC 30.4  30.0 - 36.0 g/dL   RDW 18.5 (*) 11.5 - 15.5 %   Platelets 158  150 - 400 K/uL  CREATININE, SERUM     Status: None   Collection Time    10/22/12  3:47 PM      Result  Value Range   Creatinine, Ser 0.70  0.50 - 1.35 mg/dL   GFR calc non Af Amer >90  >90 mL/min   GFR calc Af Amer >90  >90 mL/min   Comment:  The eGFR has been calculated     using the CKD EPI equation.     This calculation has not been     validated in all clinical     situations.     eGFR's persistently     <90 mL/min signify     possible Chronic Kidney Disease.  GLUCOSE, CAPILLARY     Status: Abnormal   Collection Time    10/22/12  4:34 PM      Result Value Range   Glucose-Capillary 153 (*) 70 - 99 mg/dL   Comment 1 Notify RN    GLUCOSE, CAPILLARY     Status: Abnormal   Collection Time    10/22/12  9:04 PM      Result Value Range   Glucose-Capillary 193 (*) 70 - 99 mg/dL   Comment 1 Notify RN    BASIC METABOLIC PANEL     Status: Abnormal   Collection Time    10/23/12  6:20 AM      Result Value Range   Sodium 140  135 - 145 mEq/L   Potassium 3.7  3.5 - 5.1 mEq/L   Chloride 103  96 - 112 mEq/L   CO2 30  19 - 32 mEq/L   Glucose, Bld 94  70 - 99 mg/dL   BUN 27 (*) 6 - 23 mg/dL   Creatinine, Ser 0.79  0.50 - 1.35 mg/dL   Calcium 8.8  8.4 - 10.5 mg/dL   GFR calc non Af Amer 89 (*) >90 mL/min   GFR calc Af Amer >90  >90 mL/min   Comment:            The eGFR has been calculated     using the CKD EPI equation.     This calculation has not been     validated in all clinical     situations.     eGFR's persistently     <90 mL/min signify     possible Chronic Kidney Disease.  CBC     Status: Abnormal   Collection Time    10/23/12  6:20 AM      Result Value Range   WBC 6.2  4.0 - 10.5 K/uL   RBC 3.52 (*) 4.22 - 5.81 MIL/uL   Hemoglobin 9.5 (*) 13.0 - 17.0 g/dL   HCT 31.7 (*) 39.0 - 52.0 %   MCV 90.1  78.0 - 100.0 fL   MCH 27.0  26.0 - 34.0 pg   MCHC 30.0  30.0 - 36.0 g/dL   RDW 18.5 (*) 11.5 - 15.5 %   Platelets 196  150 - 400 K/uL  GLUCOSE, CAPILLARY     Status: Abnormal   Collection Time    10/23/12  6:26 AM      Result Value Range   Glucose-Capillary  105 (*) 70 - 99 mg/dL  BASIC METABOLIC PANEL     Status: Abnormal   Collection Time    10/23/12 10:47 AM      Result Value Range   Sodium 137  135 - 145 mEq/L   Potassium 3.9  3.5 - 5.1 mEq/L   Chloride 100  96 - 112 mEq/L   CO2 28  19 - 32 mEq/L   Glucose, Bld 280 (*) 70 - 99 mg/dL   BUN 26 (*) 6 - 23 mg/dL   Creatinine, Ser 0.79  0.50 - 1.35 mg/dL   Calcium 8.9  8.4 - 10.5 mg/dL   GFR calc non Af Amer 89 (*) >90  mL/min   GFR calc Af Amer >90  >90 mL/min   Comment:            The eGFR has been calculated     using the CKD EPI equation.     This calculation has not been     validated in all clinical     situations.     eGFR's persistently     <90 mL/min signify     possible Chronic Kidney Disease.  CBC     Status: Abnormal   Collection Time    10/23/12 10:47 AM      Result Value Range   WBC 6.6  4.0 - 10.5 K/uL   RBC 3.47 (*) 4.22 - 5.81 MIL/uL   Hemoglobin 9.6 (*) 13.0 - 17.0 g/dL   HCT 30.9 (*) 39.0 - 52.0 %   MCV 89.0  78.0 - 100.0 fL   MCH 27.7  26.0 - 34.0 pg   MCHC 31.1  30.0 - 36.0 g/dL   RDW 18.7 (*) 11.5 - 15.5 %   Platelets 177  150 - 400 K/uL   Comment: PLATELET COUNT CONFIRMED BY SMEAR  GLUCOSE, CAPILLARY     Status: Abnormal   Collection Time    10/23/12 10:49 AM      Result Value Range   Glucose-Capillary 279 (*) 70 - 99 mg/dL   Comment 1 Notify RN      No results found.  Review of Systems  Constitutional: Positive for fever (with illness in January), chills and malaise/fatigue. Negative for weight loss.  Respiratory: Positive for cough, shortness of breath and wheezing.        Sleep apnea  Cardiovascular: Positive for orthopnea and leg swelling. Negative for chest pain.  Genitourinary: Positive for frequency.       Indwelling foley after prostate procedure- BPH, ? Bladder tumor  Neurological: Positive for weakness.  All other systems reviewed and are negative.   Blood pressure 98/60, pulse 88, temperature 97.9 F (36.6 C), temperature source  Oral, resp. rate 20, height 5\' 6"  (1.676 m), weight 202 lb 8 oz (91.853 kg), SpO2 98.00%. Physical Exam  Vitals reviewed. Constitutional: He is oriented to person, place, and time. He appears well-developed. No distress.  HENT:  Head: Normocephalic and atraumatic.  Eyes: EOM are normal. Pupils are equal, round, and reactive to light.  Neck: Neck supple. No thyromegaly present.  No audible bruit  Cardiovascular: Normal rate and regular rhythm.   Murmur (faint systolic murmur) heard. Respiratory: He has no wheezes. He has no rales.  Diminished BS bilaterally  GI: Soft. He exhibits mass (pulsatile). There is no tenderness.  Genitourinary:  Foley catheter in place  Musculoskeletal: He exhibits edema (2+ on left, 1+ on right up to mid calf).  Lymphadenopathy:    He has no cervical adenopathy.  Neurological: He is alert and oriented to person, place, and time. No cranial nerve deficit.  Skin: Skin is warm and dry.  Psychiatric: He has a normal mood and affect.   Echocardiogram 10/18/12 Study Conclusions  - Left ventricle: The cavity size was severely dilated. Wall thickness was normal. Systolic function was moderately to severely reduced. The estimated ejection fraction was in the range of 30% to 35%. Diffuse hypokinesis. Moderate hypokinesis of the mid-distalanteroseptal myocardium. Features are consistent with a pseudonormal left ventricular filling pattern, with concomitant abnormal relaxation and increased filling pressure (grade 2 diastolic dysfunction). - Aortic valve: There was moderate to severe stenosis. Valve area: 0.62cm^2(VTI). Valve area: 0.63cm^2 (  Vmax). - Mitral valve: Mild regurgitation. - Right ventricle: The cavity size was mildly dilated. - Pulmonary arteries: PA peak pressure: 40mm Hg (S). - Pericardium, extracardiac: A small, free-flowing pericardial effusion was identified circumferential to the heart. The fluid had no internal echoes.There was no evidence of  hemodynamic compromise. Transthoracic echocardiography. M-mode, complete 2D, spectral Doppler, and color Doppler. Height: Height: 167.6cm. Height: 66in. Weight: Weight: 91.7kg. Weight: 201.7lb. Body mass index: BMI: 32.6kg/m^2. Body surface area: BSA: 2.89m^2. Blood pressure: 97/59. Patient status: Inpatient. Location: Echo laboratory.     Echocardiogram 10/21/12 Study Conclusions  - Left ventricle: Systolic function was moderately reduced. The estimated ejection fraction was in the range of 35% to 40%. Akinesis of the mid-distalanteroseptal myocardium. - Aortic valve: AV is thickened, calcified with restricted motion Peak and mean gradients through the valve are 50 and 33 mm Hg respectively consistent with modrate AS. COmpared to echo from 10/15/12, mean gradient is increased. There was moderate stenosis. Trivial regurgitation. Valve area: 0.91cm^2(VTI). Valve area: 0.85cm^2 (Vmax). - Mitral valve: Calcified annulus. Mildly thickened leaflets . Transthoracic echocardiography. M-mode, limited 2D, spectral Doppler, and color Doppler. Height: Height: 167.6cm. Height: 66in. Weight: Weight: 89.4kg. Weight: 196.7lb. Body mass index: BMI: 31.8kg/m^2. Body surface area: BSA: 2.30m^2. Blood pressure: 99/65. Patient status: Inpatient. Location: Echo laboratory.     Cardiac Catheterization 10/22/12  Procedural Findings:  Hemodynamics (mmHg)  RA mean 9  RV 48/11  PA 48/19, mean 32  PCWP mean 22  AO 101/67  Oxygen saturations:  PA 68%  AO 98%  Cardiac Output (Fick) 6.45 L/min  Cardiac Index (Fick) 3.26  Coronary angiography:  Coronary dominance: right  Left mainstem: 30-40% distal tapering.  Left anterior descending (LAD): 95% proximal LAD after 1st septal.  Left circumflex (LCx): 70-80% ostial stenosis in a large ramus. 70% mid LCx stenosis.  Right coronary artery (RCA): 90% irregular proximal RCA stenosis. Moderate luminals up to 40% throughout the RCA.  Left  ventriculography: Not done, at least moderate AS by echo. EF around 35% by echo.  Final Conclusions: Severe 3 vessel disease with moderate systolic dysfunction and moderate to severe aortic stenosis (moderate by mean gradient, severe by calculated valve area - possible low gradient severe AS with low EF). Ideally, revascularization would be done by CABG with bioprosthetic aortic valve replacement. However, he has multiple comorbidities. He had a long hospitalization for ARDS/PNA in 1/14 with trach, now recovered. He lives in a nursing home now but has been walking with a walker. He prostate cancer that is being treated with indwelling foley. 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 I will involve CVTS and vascular surgery before making decision on how to proceed. Could intervene percutaneously but would likely not be a full revascularization. Mildly elevated right and left heart filling pressures. Continue IV Lasix.  Loralie Champagne  10/22/2012, 1:42 PM     Assessment/Plan: 71 yo male with multiple severe medical problems. He was admitted with class IV CHF- acute sytolic and diastolic. He has been found to have severe 3 vessel CAD and at least moderate, if not severe, AS. He has impaired left ventricular function with an EF of 35-40%. His AS  Was severe by calculated valve area, but only moderate by gradients. It is likely that gradient is underestimating the severity of the AS due to his weak LV. In any event the aortic valve would need to be addressed at the time of CABG.  He also has  another life threatening problem with large abdominal aortic and iliac aneurysms. Unfortunately those are not amenable to stent grafting, which would have simplified matters greatly.  He is in a generally deconditioned state from his recent prolonged hospitalization for [id number] complicated by ARDS and respiratory failure. He certainly would benefit from additional recovery time,  but any significant recovery would require a prohibitive delay in treating his aneurysms.  I have asked Pulmonary to see him regarding his COPD and after effects of ARDS to assess his fitness for surgery from a pulmonary stand point  I think the best option is to go ahead with AVR, CABG during this admission and plan for repair of his aneurysms after an interval for recovery. He is an extremely high risk patient and he will have a difficult time recovering from 2 major procedures back to back. He will be at increased risk of aneurysm rupture in the postoperative period.  I had a long and detailed discussion with the patient and his family about AVR CABG including the general nature of the procedure, need for general anesthesia, and incisions to be used. We discussed use of a tissue valve to avoid the need for anticoagulation in the setting of his aneurysms. I have discussed the expected hospital stay, overall recovery and short and long term outcomes. They understand the risks include, but are not limited to death, stroke, MI, DVT/PE, bleeding, possible need for transfusion, infections, heart block, arrhythmias, and other organ system dysfunction including respiratory, renal, or GI complications. He is at especially high risk for respiratory failure.   Will wait for Pulmonary to weigh in on his status before making a final decision.  Crissie Aloi C 10/23/2012, 4:15 PM

## 2012-10-23 NOTE — Clinical Documentation Improvement (Signed)
GENERIC DOCUMENTATION CLARIFICATION QUERY   CLINICAL DOCUMENTATION QUERIES ARE NOT PART OF THE PERMANENT MEDICAL RECORD   Please update your documentation within the medical record to reflect your response to this query.                                                                                         10/23/12   Dr. [name],  In a better effort to capture your patient's severity of illness, reflect appropriate length of stay and utilization of resources, a review of the patient medical record has revealed the following information:   - Patient admitted with Acute on Chronic Systolic Heart Failure   - Admission Troponin within normal limits at  <0.30   - Repeat Troponin's trended - 0.36;  0.33   - Current Diagnosis for abnormal Troponin's - "Suspect Demand Ischemia due to Volume Overload" per                   Cardiology Consult 10/19/12.   - Coronary Angiography revealing severe obstructive CAD per dictated Cardiac Cath report 10/22/12 and 2D         Echo 10/18/12 revealing regional wall motion abnormalities.    Based on your clinical judgment, please document in the progress notes and discharge summary if the diagnosis of "Suspected Demand Ischemia due to Volume Overload" can be further clarified as:   - Non ST Elevation MI  (NSTEMI), Present on Admission   - Other Condition   - Unable to Clinically Determine   In responding to this query please exercise your independent judgment.    The fact that a query is asked, does not imply that any particular answer is desired or expected.   Reviewed: query withdrawn 10/23/12.  Vilinda Flake RN  Thank You,  Erling Conte  RN BSN CCDS Certified Clinical Documentation Specialist: Cell   [phone]  Health Information Management [place]  TO RESPOND TO THE THIS QUERY, FOLLOW THE INSTRUCTIONS BELOW:  1. If needed, update documentation for the patient's encounter via the notes activity.  2. Access this query again  and click edit on the [facility] [employer].  3. After updating, or not, click F2 to complete all highlighted (required) fields concerning your review. Select "additional documentation in the medical record" OR "no additional documentation provided".  4. Click Sign note button.  5. The deficiency will fall out of your In Basket *Please let us know if you are not able to complete this workflow by phone or e-mail (listed below).

## 2012-10-23 NOTE — Progress Notes (Signed)
PFT completed. Unconfirmed results placed in Progress Notes of [facility]. 

## 2012-10-23 NOTE — Progress Notes (Signed)
[facility] PROGRESS NOTE  [name] [id number] DOB: [birth year] DOA: 10/17/2012 PCP: [name], MD   This patient has been hospitalized since January when he had acute respiratory failure due to PNA/viral infection- he was trached and sent to [facility].  He was then sent to [facility].  At [facility] he noticed his weight was increasing and he noted an increase in swelling in his legs.  He was scheduled to see a cardiologist but became too SOB and was sent to the ER instead. 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      Assessment/Plan:   Acute on chronic systolic CHF - appears to be new diagnosis, diuresing patient with lasix  changed to q 12, already has gotten a significant amount off; beta blocker,  2D echo shows decreased EF at 30-35% no h/o CHF previously but just got over a massive critical illness--   3 vessel CAD - cath 4/22 - Left mainstem: 30-40% distal tapering.  Left anterior descending (LAD): 95% proximal LAD after 1st septal.  Left circumflex (LCx): 70-80% ostial stenosis in a large ramus. 70% mid LCx stenosis.  Right coronary artery (RCA): 90% irregular proximal RCA stenosis. Moderate luminals up to 40% throughout the RCA.   Aortic stenosis -   AAA and right iliac artery anuerysm    Recent H1N1 and strep pneumonia with ARDS - ? 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   OSA - ordered home CPAP  Prostate CA -  has foley in place for several more weeks at a minimum according to patient and family.   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   Consultants:  Cardiology  TCTS  Procedures:  Echo  Cardiac cath  PFTs   HPI/Subjective: Less  doe   Objective: Filed Vitals:   10/22/12 2128 10/22/12 2257 10/23/12 0553 10/23/12 0840  BP: 92/68  97/61   Pulse: 94 91 92   Temp: 98.1 F (36.7 C)  98 F (36.7 C)   TempSrc: Oral  Oral   Resp: 20 18 20    Height:      Weight:   91.5 kg (201 lb 11.5 oz) 91.853 kg (202 lb 8 oz)  SpO2: 95% 94% 99%     Intake/Output Summary (Last 24 hours) at 10/23/12 1001 Last data filed at 10/23/12 0842  Gross per 24 hour  Intake 1995.83 ml  Output   2200 ml  Net -204.17 ml   Filed Weights   10/22/12 0505 10/23/12 0553 10/23/12 0840  Weight: 89.5 kg (197 lb 5 oz) 91.5 kg (201 lb 11.5 oz) 91.853 kg (202 lb 8 oz)    Exam:   General:  A+Ox3, NAd  Cardiovascular: rrr  Respiratory: bilateral crackles  Abdomen: +BS, soft, NT  Musculoskeletal: LE edema  Data Reviewed: Basic Metabolic Panel:  Recent Labs Lab 10/18/12 0935  10/19/12 1525 10/20/12 0629 10/21/12 0900 10/22/12 0530 10/22/12 1547 10/23/12 0620  NA 143  < > 142 136 139 142  --  140  K 3.5  < > 3.5 4.7 4.4 3.4*  --  3.7  CL 102  < > 101 99 95* 101  --  103  CO2 33*  < > 33*  26 32 35*  --  30  GLUCOSE 154*  < > 129* 110* 185* 104*  --  94  BUN 18  < > 20 22 26* 28*  --  27*  CREATININE 0.82  < > 0.86 0.72 0.73 0.77 0.70 0.79  CALCIUM 9.3  < > 8.9 9.0 9.6 9.1  --  8.8  MG 1.5  --   --   --   --   --   --   --   < > = values in this interval not displayed. Liver Function Tests: No results found for this basename: AST, ALT, ALKPHOS, BILITOT, PROT, ALBUMIN,  in the last 168 hours No results found for this basename: LIPASE, AMYLASE,  in the last 168 hours No results found for this basename: AMMONIA,  in the last 168 hours CBC:  Recent Labs Lab 10/19/12 0514 10/22/12 0530 10/22/12 1547 10/23/12 0620  WBC 6.8 5.9 6.2 6.2  HGB 9.6* 10.0* 10.8* 9.5*  HCT 31.6* 32.5* 35.5* 31.7*  MCV 89.8 90.3 90.8 90.1  PLT 197 203 158 196   Cardiac Enzymes:  Recent Labs Lab 10/17/12 2151 10/18/12 0244 10/18/12 0935   TROPONINI <0.30 0.36* 0.33*   BNP (last 3 results)  Recent Labs  10/19/12 1526  PROBNP 7876.0*   CBG:  Recent Labs Lab 10/22/12 1106 10/22/12 1356 10/22/12 1634 10/22/12 2104 10/23/12 0626  GLUCAP 148* 123* 153* 193* 105*    Recent Results (from the past 240 hour(s))  MRSA PCR SCREENING     Status: None   Collection Time    10/17/12  8:08 PM      Result Value Range Status   MRSA by PCR NEGATIVE  NEGATIVE Final   Comment:            The GeneXpert MRSA Assay (FDA     approved for NASAL specimens     only), is one component of a     comprehensive MRSA colonization     surveillance program. It is not     intended to diagnose MRSA     infection nor to guide or     monitor treatment for     MRSA infections.     Studies: No results found.  Scheduled Meds: . albuterol  2.5 mg Nebulization TID  . aspirin  81 mg Oral Daily  . atorvastatin  80 mg Oral Daily  . carvedilol  3.125 mg Oral BID WC  . docusate sodium  100 mg Oral BID  . dutasteride  0.5 mg Oral QPM  . furosemide  40 mg Intravenous Q12H  . heparin  5,000 Units Subcutaneous Q8H  . insulin aspart  0-15 Units Subcutaneous TID WC  . ipratropium  0.5 mg Nebulization TID  . senna-docusate  1 tablet Oral BID  . sertraline  50 mg Oral QPM  . sodium chloride  3 mL Intravenous Q12H  . spironolactone  25 mg Oral Daily  . tamsulosin  0.4 mg Oral Q12H  . traZODone  25 mg Oral QHS   Continuous Infusions: . sodium chloride      Principal Problem:   CHF, acute Active Problems:   ARDS (adult respiratory distress syndrome)   Borderline diabetes   Cardiomyopathy, ischemic   Aortic stenosis   AAA (abdominal aortic aneurysm) without rupture   Aneurysm of right iliac artery    [name]  [facility] Pager [phone]. If 7PM-7AM, please contact night-coverage at [email or web address], password [facility] 10/23/2012, 10:01 AM  LOS: 6 days

## 2012-10-24 ENCOUNTER — Inpatient Hospital Stay (HOSPITAL_COMMUNITY): Payer: BC Managed Care – PPO

## 2012-10-24 ENCOUNTER — Telehealth: Payer: Self-pay | Admitting: Vascular Surgery

## 2012-10-24 DIAGNOSIS — J9589 Other postprocedural complications and disorders of respiratory system, not elsewhere classified: Secondary | ICD-10-CM

## 2012-10-24 LAB — CBC
MCV: 89.4 fL (ref 78.0–100.0)
Platelets: 190 10*3/uL (ref 150–400)
RBC: 3.58 MIL/uL — ABNORMAL LOW (ref 4.22–5.81)
RDW: 18.4 % — ABNORMAL HIGH (ref 11.5–15.5)
WBC: 4.9 10*3/uL (ref 4.0–10.5)

## 2012-10-24 LAB — URINALYSIS, ROUTINE W REFLEX MICROSCOPIC
Bilirubin Urine: NEGATIVE
Glucose, UA: NEGATIVE mg/dL
Hgb urine dipstick: NEGATIVE
Ketones, ur: NEGATIVE mg/dL
Protein, ur: NEGATIVE mg/dL

## 2012-10-24 LAB — BASIC METABOLIC PANEL
BUN: 25 mg/dL — ABNORMAL HIGH (ref 6–23)
CO2: 31 mEq/L (ref 19–32)
Calcium: 9.2 mg/dL (ref 8.4–10.5)
Calcium: 9.4 mg/dL (ref 8.4–10.5)
Creatinine, Ser: 0.81 mg/dL (ref 0.50–1.35)
Creatinine, Ser: 0.83 mg/dL (ref 0.50–1.35)
GFR calc Af Amer: >90 mL/min (ref >90)
GFR calc Af Amer: >90 mL/min (ref >90)
GFR calc non Af Amer: 88 mL/min — ABNORMAL LOW (ref >90)
Sodium: 141 mEq/L (ref 135–145)

## 2012-10-24 LAB — COMPREHENSIVE METABOLIC PANEL
ALT: 23 U/L (ref 0–53)
AST: 25 U/L (ref 0–37)
Albumin: 3.1 g/dL — ABNORMAL LOW (ref 3.5–5.2)
Alkaline Phosphatase: 72 U/L (ref 39–117)
CO2: 33 mEq/L — ABNORMAL HIGH (ref 19–32)
Chloride: 100 mEq/L (ref 96–112)
GFR calc non Af Amer: 84 mL/min — ABNORMAL LOW (ref >90)
Potassium: 4.2 mEq/L (ref 3.5–5.1)
Sodium: 139 mEq/L (ref 135–145)
Total Bilirubin: 0.4 mg/dL (ref 0.3–1.2)

## 2012-10-24 LAB — URINE MICROSCOPIC-ADD ON

## 2012-10-24 LAB — GLUCOSE, CAPILLARY
Glucose-Capillary: 111 mg/dL — ABNORMAL HIGH (ref 70–99)
Glucose-Capillary: 147 mg/dL — ABNORMAL HIGH (ref 70–99)

## 2012-10-24 LAB — ALBUMIN: Albumin: 2.9 g/dL — ABNORMAL LOW (ref 3.5–5.2)

## 2012-10-24 LAB — PROTIME-INR
INR: 0.95 (ref 0.00–1.49)
Prothrombin Time: 12.6 seconds (ref 11.6–15.2)

## 2012-10-24 MED ORDER — DOPAMINE-DEXTROSE 3.2-5 MG/ML-% IV SOLN
2.0000 ug/kg/min | INTRAVENOUS | Status: DC
  Filled 2012-10-24: qty 250

## 2012-10-24 MED ORDER — CHLORHEXIDINE GLUCONATE 4 % EX LIQD
60.0000 mL | Freq: Once | CUTANEOUS | Status: AC
  Administered 2012-10-24: 4 via TOPICAL
  Filled 2012-10-24 (×2): qty 60

## 2012-10-24 MED ORDER — SODIUM CHLORIDE 0.9 % IV SOLN
INTRAVENOUS | Status: AC
  Administered 2012-10-25: 1 [IU]/h via INTRAVENOUS
  Filled 2012-10-24: qty 1

## 2012-10-24 MED ORDER — BISACODYL 5 MG PO TBEC
5.0000 mg | DELAYED_RELEASE_TABLET | Freq: Once | ORAL | Status: AC
  Administered 2012-10-24: 5 mg via ORAL
  Filled 2012-10-24: qty 1

## 2012-10-24 MED ORDER — PHENYLEPHRINE HCL 10 MG/ML IJ SOLN
30.0000 ug/min | INTRAVENOUS | Status: DC
  Filled 2012-10-24: qty 2

## 2012-10-24 MED ORDER — VANCOMYCIN HCL 10 G IV SOLR
1250.0000 mg | INTRAVENOUS | Status: AC
  Administered 2012-10-25: 1250 mg via INTRAVENOUS
  Filled 2012-10-24: qty 1250

## 2012-10-24 MED ORDER — SODIUM CHLORIDE 0.9 % IV SOLN
INTRAVENOUS | Status: AC
  Administered 2012-10-25: 70 mL/h via INTRAVENOUS
  Filled 2012-10-24: qty 40

## 2012-10-24 MED ORDER — METOPROLOL TARTRATE 12.5 MG HALF TABLET
12.5000 mg | ORAL_TABLET | Freq: Once | ORAL | Status: AC
  Administered 2012-10-25: 12.5 mg via ORAL
  Filled 2012-10-24: qty 1

## 2012-10-24 MED ORDER — ALPRAZOLAM 0.25 MG PO TABS: 0.2500 mg | ORAL_TABLET | ORAL | Status: DC | PRN

## 2012-10-24 MED ORDER — CHLORHEXIDINE GLUCONATE 4 % EX LIQD
60.0000 mL | Freq: Once | CUTANEOUS | Status: AC
  Administered 2012-10-25: 4 via TOPICAL
  Filled 2012-10-24: qty 60

## 2012-10-24 MED ORDER — POTASSIUM CHLORIDE 2 MEQ/ML IV SOLN
80.0000 meq | INTRAVENOUS | Status: DC
  Filled 2012-10-24: qty 40

## 2012-10-24 MED ORDER — DEXMEDETOMIDINE HCL IN NACL 400 MCG/100ML IV SOLN
0.1000 ug/kg/h | INTRAVENOUS | Status: AC
  Administered 2012-10-25: 0.2 ug/kg/h via INTRAVENOUS
  Filled 2012-10-24: qty 100

## 2012-10-24 MED ORDER — LEVOFLOXACIN IN D5W 500 MG/100ML IV SOLN
500.0000 mg | INTRAVENOUS | Status: AC
  Administered 2012-10-25: 500 mg via INTRAVENOUS
  Filled 2012-10-24: qty 100

## 2012-10-24 MED ORDER — NITROGLYCERIN IN D5W 200-5 MCG/ML-% IV SOLN
2.0000 ug/min | INTRAVENOUS | Status: AC
  Administered 2012-10-25: 25 ug/min via INTRAVENOUS
  Filled 2012-10-24: qty 250

## 2012-10-24 MED ORDER — EPINEPHRINE HCL 1 MG/ML IJ SOLN
0.5000 ug/min | INTRAVENOUS | Status: DC
  Filled 2012-10-24: qty 4

## 2012-10-24 MED ORDER — PLASMA-LYTE 148 IV SOLN
INTRAVENOUS | Status: AC
  Administered 2012-10-25: 10:00:00
  Filled 2012-10-24: qty 2.5

## 2012-10-24 MED ORDER — TEMAZEPAM 15 MG PO CAPS: 15.0000 mg | ORAL_CAPSULE | Freq: Once | ORAL | Status: AC | PRN

## 2012-10-24 MED ORDER — MAGNESIUM SULFATE 50 % IJ SOLN
40.0000 meq | INTRAMUSCULAR | Status: DC
  Filled 2012-10-24: qty 10

## 2012-10-24 MED ORDER — POTASSIUM CHLORIDE CRYS ER 20 MEQ PO TBCR
40.0000 meq | EXTENDED_RELEASE_TABLET | Freq: Once | ORAL | Status: AC
  Administered 2012-10-24: 40 meq via ORAL
  Filled 2012-10-24: qty 2

## 2012-10-24 MED ORDER — LISINOPRIL 2.5 MG PO TABS
2.5000 mg | ORAL_TABLET | Freq: Every day | ORAL | Status: DC
  Administered 2012-10-24: 2.5 mg via ORAL
  Filled 2012-10-24 (×2): qty 1

## 2012-10-24 MED ORDER — DIAZEPAM 2 MG PO TABS
2.0000 mg | ORAL_TABLET | Freq: Once | ORAL | Status: AC
  Administered 2012-10-25: 2 mg via ORAL
  Filled 2012-10-24: qty 1

## 2012-10-24 NOTE — Telephone Encounter (Addendum)
Message copied by Gena Fray on Thu Oct 24, 2012 11:44 AM ------      Message from: Alfonso Patten      Created: Thu Oct 24, 2012 10:57 AM                   ----- Message -----         From: Ulyses Amor, PA-C         Sent: 10/24/2012   9:24 AM           To: Alfonso Patten, RN            F/U in 2-3 months with Dr. Kellie Simmering. ------   10/24/12: spoke with patient to schedule, also mailed to home address, dpm

## 2012-10-24 NOTE — Progress Notes (Signed)
10/24/12 2245  BiPAP/CPAP/SIPAP  BiPAP/CPAP/SIPAP Pt Type Adult  Mask Type Full face mask  Mask Size Medium  Respiratory Rate 18 breaths/min  IPAP 13 cmH20  EPAP 13 cmH2O  Oxygen Percent 28 %  Flow Rate 2 lpm  Patient Home Equipment No  Auto Titrate No  BiPAP/CPAP /SiPAP Vitals  Pulse Rate 93  Resp 18  SpO2 98 %  Patient placed on CPAP 13cmh2o with 2L oxygen bleed in. He tolerates it very well at this time.

## 2012-10-24 NOTE — Progress Notes (Signed)
PULMONARY  / CRITICAL CARE MEDICINE  Name: [name] MRN: [id number] DOB: [birth year]    ADMISSION DATE:  10/17/2012 CONSULTATION DATE:  10/23/12  REFERRING MD :  Dr.  PRIMARY SERVICE:  TRH  CHIEF COMPLAINT:  Pre-Op Pulmonary Clearance    HISTORY OF PRESENT ILLNESS:  72 y/o, SNF Resident ([facility]), former smoker with approx 150 pk yr hx (up to 2-3ppd since age 40) with PMH of COPD, OSA on CPAP and recent admit for ARDS s/p trach secondary to H1N1 in 07/2012 admitted 417 with worsening shortness of breath associated with weight gain.  Patient notes he was discharged from [facility] on 08/12/12 to [facility], where trach was removed, and then further went to [facility] for continued rehab efforts.  He began noticing increased weight gain -up to 5lbs per day, worsening shortness of breath, orthopnea and decreased activity tolerance. Underwent ECHO which demonstrated EF of 35-40% & moderate AS.  Cardiac cath 4/22 with severe 3 vessel disease (LAD 95%, LCx 70-80%, RCA 90%)  with moderate systolic dysfunction and moderate to severe aortic stenosis.  4/23 PFTs reveal severe obstructive / restrictive disease & severely reduced DLCO.  CT ABD 07/21/12 notes Abdominal aortic aneurysm measuring 6.4 cm AP diameter. 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     Prior to admit, patient had been weaned down to one liter of O2 PRN, was walking with a walker.  Has not smoked since prior to admit in January.      BRIEF PATIENT DESCRIPTION:  72 y/o, SNF Resident ([facility]) with PMH of COPD, OSA on CPAP and recent admit for ARDS secondary to H1N1 in 07/2012 admitted 417 with SOB, weight increase (up to 5lbs per day) and found to have 3 vessel CAD, AS and AAA.  PCCM asked to evaluate for pre-op pulmonary clearance.    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 4/21 - ECHO>>EF 35-40%, moderate AS 4/22 - Cardiac Cath >>Severe 3 vessel disease (LAD  95%, LCx 70-80%, RCA 90%)  with moderate systolic dysfunction and moderate to severe aortic stenosis  4/23 - PFT with severe obstructive, severe restrictive disease & severely reduced DLCO.   4/23 - Pre-CABG Doppler >>R ICA without hemodynamically significant stenosis.  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  LINES / TUBES:   CULTURES: 4/17 MRSA PCR>>>neg  ANTIBIOTICS:   SUBJECTIVE: "ready for surgery". " I am a fighter and I am a bad loser.  I would ratheer have surgery and die than not have surgery and die"  VITAL SIGNS: Temp:  [97.6 F (36.4 C)-98.3 F (36.8 C)] 98.3 F (36.8 C) (04/24 0549) Pulse Rate:  [80-91] 88 (04/24 0549) Resp:  [20] 20 (04/24 0549) BP: (93-112)/(60-70) 93/67 mmHg (04/24 0938) SpO2:  [96 %-98 %] 96 % (04/24 0837) FiO2 (%):  [97 %] 97 % (04/23 2351) Weight:  [90.9 kg (200 lb 6.4 oz)] 90.9 kg (200 lb 6.4 oz) (04/24 0549)  PHYSICAL EXAMINATION: General:  Chronically ill in NAD Neuro:  AAOx4, MAE3 HEENT:  Mm pink/moist, pupils =R Cardiovascular:  s1s2 rrr, no m/r/g Lungs:  resp's even/non-labored, lungs bilaterally diminished but clear Abdomen:  Obese/soft, bsx4 active Musculoskeletal:  No acute deformities Skin:  Multiple scattered bruises   Recent Labs Lab 10/23/12 0620 10/23/12 1047 10/24/12 0450  NA 140 137 141  K 3.7 3.9 3.9  CL 103 100 102  CO2 30 28 31   BUN 27* 26*  24*  CREATININE 0.79 0.79 0.81  GLUCOSE 94 280* 119*    Recent Labs Lab 10/23/12 0620 10/23/12 1047 10/24/12 0450  HGB 9.5* 9.6* 9.7*  HCT 31.7* 30.9* 32.0*  WBC 6.2 6.6 4.9  PLT 196 177 190    ASSESSMENT / PLAN:  COPD OSA Hx of H1N1 / ARDS s/p Trach Preoperative Pulmonary Evaluation  72 y/o WM with extensive smoking history, obstructive sleep apnea with compliance of CPAP for last 15 years, and recent lung injury secondary to H1N1.  Decannulated at [facility] prior to discharge.  PFT's demonstrate mixed obstructive and restrictive disease as well as  reduced DLCO.  Patient is high risk surgical candidate from pulmonary standpoint - anticipate prolonged mechanical ventilation, respiratory arrest, chronic critical illness, LTAC and tracheostomy and high mortality/morbidity. He absolutely understands this and wants surgery despite risks. He understands the high risk. Risk ameliorating factors are normal renal function and normal nutritional status  Details of Preoperative Pulmonar evluation below  [facility]ullah Postperative Pulmonary Risk Score  10/24/2012 Patient score  comment  Type of surgery - abd ao aneurysm (27), thoracic (21), neurosurgery / upper abdominal / vascular (21), neck (11) 21 cabg with avr 4/245/14  Emergency Surgery - (11) 0, no Semi-elective  ALbumin < 3 or poor nutritional state - (9) 0, Data N/A  Clinically looks well  BUN > 30 -  (8) 0 It is 24mg %  Partial or completely dependent functional status - (7) 7 Walks with walker now, was bedridden with ards few months ago  COPD -  (6) 6 Moderate to severe impairment on PFT in April 2014  Age - 67 to 73 (4), > 70  (6) 6 Age is 89  TOTAL 40   Risk Stratifcation scores  - < 10, 11-19, 20-27, 28-40, >40 Moderate-High      CANET Postperative Pulmonary Risk Score Patient score 10/24/2012  comment  Age - <50 (0), 50-80 (3), >80 (16) 3 Age is 37  Preoperative pulse ox - >96 (0), 91-95 (8), <90 (24) 24 85% on RA 10/24/2012   Respiratory infection in last month - Yes (17) 0 None  Preoperative anemia - < 10gm% - Yes (11) 11 hgb 9.7gm% 10/24/12  Surgical incision - Upper abdominal (15), Thoracic (24) 24 cabg-avr  Duration of surgery - <2h (0), 2-3h (16), >3h (23) 23 Surgery likely >3h  Emergency Surgery - Yes (8) 0 Semi-elective surgery  TOTAL 85   Risk Stratification - Low (<26), Intermediate (26-44), High (>45) High       REC 1. Short duration of surgery as much as possible  2. Recovery in ICU with Pulmonary consultation 3. DVT prophylaxis 4. Aggressive pulmonary toilet with  o2, bronchodilatation, and incentive spirometry and early ambulation 5. PRBC only for hgb <8gm%    Dr. Brand Males, M.D., [facility].C.P Pulmonary and Critical Care Medicine Staff Physician [facility] Pager: [phone], If no answer or between  15:00h - 7:00h: call [phone]  10/24/2012 10:05 AM

## 2012-10-24 NOTE — Progress Notes (Signed)
No complaints today  BP 89/61  Pulse 90  Temp(Src) 97.6 F (36.4 C) (Oral)  Resp 18  Ht 5\' 6"  (1.676 m)  Wt 200 lb 6.4 oz (90.9 kg)  BMI 32.36 kg/m2  SpO2 96%   Intake/Output Summary (Last 24 hours) at 10/24/12 1622 Last data filed at 10/24/12 1455  Gross per 24 hour  Intake   1083 ml  Output   3550 ml  Net  -2467 ml   For AVR(tissue valve) and CABG tomorrow.  He is aware of the high risk nature of the procedure and wishes to proceed.  All questions answered

## 2012-10-24 NOTE — Progress Notes (Signed)
10/24/12 1305  Pt. had cardiac catheterization, which revealed severe 3 vessel disease and severe Aortic stenosis.  Pt. to have CABG and AVR tomorrow.  Pt. has support from spouse and family.  He may dc home with [place] vs SNF.  Pt. was at [facility] prior to admission.  NCM will follow for dc needs.  Llana Aliment, RN, BSN NCM [phone]

## 2012-10-24 NOTE — Progress Notes (Signed)
Pt Oxygen level dropped to 85% on RA, restarted Oxygen at 2L, Oxygen level returned to 95%.

## 2012-10-24 NOTE — Progress Notes (Signed)
Patient ID: [name], male   DOB: 06-10-1941, 72 y.o.   MRN: [id number]    SUBJECTIVE: No chest pain.  Breathing has steadily improved.  Good diuresis yesterday.   Marland Kitchen albuterol  2.5 mg Nebulization TID  . aspirin  81 mg Oral Daily  . atorvastatin  80 mg Oral Daily  . carvedilol  3.125 mg Oral BID WC  . docusate sodium  100 mg Oral BID  . dutasteride  0.5 mg Oral QPM  . furosemide  40 mg Intravenous Q12H  . heparin  5,000 Units Subcutaneous Q8H  . insulin aspart  0-15 Units Subcutaneous TID WC  . ipratropium  0.5 mg Nebulization TID  . lisinopril  2.5 mg Oral Daily  . potassium chloride  40 mEq Oral Once  . senna-docusate  1 tablet Oral BID  . sertraline  50 mg Oral QPM  . sodium chloride  3 mL Intravenous Q12H  . spironolactone  25 mg Oral Daily  . tamsulosin  0.4 mg Oral Q12H  . traZODone  25 mg Oral QHS      Filed Vitals:   10/23/12 2101 10/23/12 2123 10/23/12 2351 10/24/12 0549  BP:  112/70  105/68  Pulse: 80 91 89 88  Temp:  97.6 F (36.4 C)  98.3 F (36.8 C)  TempSrc:  Oral  Oral  Resp: 20 20 20 20   Height:      Weight:    200 lb 6.4 oz (90.9 kg)  SpO2: 97% 97%  96%    Intake/Output Summary (Last 24 hours) at 10/24/12 0724 Last data filed at 10/24/12 0551  Gross per 24 hour  Intake   1440 ml  Output   4400 ml  Net  -2960 ml    LABS: Basic Metabolic Panel:  Recent Labs  10/23/12 1047 10/24/12 0450  NA 137 141  K 3.9 3.9  CL 100 102  CO2 28 31  GLUCOSE 280* 119*  BUN 26* 24*  CREATININE 0.79 0.81  CALCIUM 8.9 9.2   Liver Function Tests: No results found for this basename: AST, ALT, ALKPHOS, BILITOT, PROT, ALBUMIN,  in the last 72 hours No results found for this basename: LIPASE, AMYLASE,  in the last 72 hours CBC:  Recent Labs  10/23/12 1047 10/24/12 0450  WBC 6.6 4.9  HGB 9.6* 9.7*  HCT 30.9* 32.0*  MCV 89.0 89.4  PLT 177 190   Cardiac Enzymes: No results found for this basename: CKTOTAL, CKMB, CKMBINDEX, TROPONINI,  in the  last 72 hours BNP: No components found with this basename: POCBNP,  D-Dimer: No results found for this basename: DDIMER,  in the last 72 hours Hemoglobin A1C: No results found for this basename: HGBA1C,  in the last 72 hours Fasting Lipid Panel: No results found for this basename: CHOL, HDL, LDLCALC, TRIG, CHOLHDL, LDLDIRECT,  in the last 72 hours Thyroid Function Tests: No results found for this basename: TSH, T4TOTAL, FREET3, T3FREE, THYROIDAB,  in the last 72 hours Anemia Panel: No results found for this basename: VITAMINB12, FOLATE, FERRITIN, TIBC, IRON, RETICCTPCT,  in the last 72 hours  RADIOLOGY: Dg Chest 2 View  10/18/2012  *RADIOLOGY REPORT*  Clinical Data: Shortness of breath, cough and congestion.  CHEST - 2 VIEW  Comparison: 10/17/2012 and CT chest 08/11/2012.  Findings: Trachea is midline.  Heart is at the upper limits of normal in size.  There is diffuse mixed interstitial and airspace disease with linear scarring at the left lung base.  Small bilateral pleural  effusions.  Findings are superimposed on emphysema.  IMPRESSION: Persistent congestive heart failure, possibly minimally improved from 10/17/2012.   Original Report Authenticated By: Lorin Picket, M.D.     PHYSICAL EXAM General: NAD Neck: JVP 8-9 cm, no thyromegaly or thyroid nodule.  Lungs: Clear to auscultation bilaterally with normal respiratory effort. CV: Nondisplaced PMI.  Heart regular S1/S2, no [id number], 2/6 systolic murmur RUSB, S2 heard clearly.  Trace ankle edema on right, 1+ edema 1/2 to knee on left.  No carotid bruit.   Abdomen: Soft, nontender, no hepatosplenomegaly, no distention.  Neurologic: Alert and oriented x 3.  Psych: Normal affect. Extremities: No clubbing or cyanosis.   TELEMETRY: Reviewed telemetry pt in NSR  ASSESSMENT AND PLAN: 72 yo with history of recent severe illness involving respiratory failure and tracheostomy as well as DM, HTN, and COPD presented to the ER at [facility] with acute  systolic CHF and was transferred up to [facility].  1. CHF: Acute systolic CHF with EF [id number] and anteroseptal wall motion abnormality. Onset of symptoms was gradual, no event that seems consistent with ACS. He has an ischemic cardiomyopathy based on cath yesterday with severe 3VD.  His filling pressures were still mildly elevated yesterday on RHC. He is mildly volume overloaded now after diuresis.   - Continue Lasix 40 IV bid today, he has had a reasonable diuresis at this dose.  - Continue current Coreg and lisinopril, will not titrate up with soft blood pressure.  - He is on spironolactone 25 mg daily.  2. CAD: Severe 3 vessel disease, best approached via CABG.  He will be higher risk for surgery given comorbidities but he actually was planning on going home from SNF prior to this admission.  Continue ASA 81 and atorvastatin 80.  Plan for high risk CABG-AVR tomorrow.  3. Aortic stenosis: Moderate to severe AS, moderate by mean gradient but severe by calculated valve area (cannot rule out low gradient severe AS).  Bioprosthetic AVR tomorrow. 4. 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  Seen by [facility], not stent graft candidate.  Plan to do CABG-AVR, then AAA/iliac aneurysm repair after he has recovered in 3 months or so. 5. Pulmonary: PFTs suggests moderate to severe impairment with mixed obstructive (COPD) and restrictive (post-ARDs) picture.    Loralie Champagne 10/24/2012

## 2012-10-24 NOTE — Progress Notes (Signed)
[facility] PROGRESS NOTE  [name] [id number] DOB: Aug 02, 1940 DOA: 10/17/2012 PCP: [name], MD   This patient has been hospitalized since January when he had acute respiratory failure due to PNA/viral infection- he was trached and sent to [facility].  He was then sent to [place] home.  At [place] he noticed his weight was increasing and he noted an increase in swelling in his legs.  He was scheduled to see a cardiologist but became too SOB and was sent to the ER instead. 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  For cabg and AVR 10/25/12    Assessment/Plan:   Acute on chronic systolic CHF - appears to be new diagnosis, diuresing patient with lasix  changed to q 12, already has gotten a significant amount off; beta blocker,  2D echo shows decreased EF at 30-35% no h/o CHF previously but just got over a massive critical illness--   3 vessel CAD - cath 4/22 - Left mainstem: 30-40% distal tapering.  Left anterior descending (LAD): 95% proximal LAD after 1st septal.  Left circumflex (LCx): 70-80% ostial stenosis in a large ramus. 70% mid LCx stenosis.  Right coronary artery (RCA): 90% irregular proximal RCA stenosis. Moderate luminals up to 40% throughout the RCA.   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   OSA - ordered home CPAP  Prostate CA vs BPH  -  has foley in place for several more weeks at a minimum according to patient and family.   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    Code Status: full Family Communication: patient at bedside Disposition Plan: snf   Consultants:  Cardiology  TCTS  Pulm  Procedures:  Echo  Cardiac  cath  PFTs   HPI/Subjective: Eager to proceed to surgery    Objective: Filed Vitals:   10/23/12 2101 10/23/12 2123 10/23/12 2351 10/24/12 0549  BP:  112/70  105/68  Pulse: 80 91 89 88  Temp:  97.6 F (36.4 C)  98.3 F (36.8 C)  TempSrc:  Oral  Oral  Resp: 20 20 20 20   Height:      Weight:    90.9 kg (200 lb 6.4 oz)  SpO2: 97% 97%  96%    Intake/Output Summary (Last 24 hours) at 10/24/12 0801 Last data filed at 10/24/12 0551  Gross per 24 hour  Intake   1440 ml  Output   4400 ml  Net  -2960 ml   Filed Weights   10/23/12 0553 10/23/12 0840 10/24/12 0549  Weight: 91.5 kg (201 lb 11.5 oz) 91.853 kg (202 lb 8 oz) 90.9 kg (200 lb 6.4 oz)    Exam:   General:  A+Ox3, NAd  Cardiovascular: rrr  Respiratory: decreased crackles   Abdomen: +BS, soft, NT  Musculoskeletal: LE edema  Data Reviewed: Basic Metabolic Panel:  Recent Labs Lab 10/18/12 0935  10/21/12 0900 10/22/12 0530 10/22/12 1547 10/23/12 0620 10/23/12 1047 10/24/12 0450  NA 143  < > 139 142  --  140 137 141  K 3.5  < > 4.4 3.4*  --  3.7 3.9 3.9  CL 102  < > 95* 101  --  103  100 102  CO2 33*  < > 32 35*  --  30 28 31   GLUCOSE 154*  < > 185* 104*  --  94 280* 119*  BUN 18  < > 26* 28*  --  27* 26* 24*  CREATININE 0.82  < > 0.73 0.77 0.70 0.79 0.79 0.81  CALCIUM 9.3  < > 9.6 9.1  --  8.8 8.9 9.2  MG 1.5  --   --   --   --   --   --   --   < > = values in this interval not displayed. Liver Function Tests: No results found for this basename: AST, ALT, ALKPHOS, BILITOT, PROT, ALBUMIN,  in the last 168 hours No results found for this basename: LIPASE, AMYLASE,  in the last 168 hours No results found for this basename: AMMONIA,  in the last 168 hours CBC:  Recent Labs Lab 10/22/12 0530 10/22/12 1547 10/23/12 0620 10/23/12 1047 10/24/12 0450  WBC 5.9 6.2 6.2 6.6 4.9  HGB 10.0* 10.8* 9.5* 9.6* 9.7*  HCT 32.5* 35.5* 31.7* 30.9* 32.0*  MCV 90.3 90.8 90.1 89.0 89.4  PLT 203 158 196 177 190    Cardiac Enzymes:  Recent Labs Lab 10/17/12 2151 10/18/12 0244 10/18/12 0935  TROPONINI <0.30 0.36* 0.33*   BNP (last 3 results)  Recent Labs  10/19/12 1526  PROBNP 7876.0*   CBG:  Recent Labs Lab 10/23/12 0626 10/23/12 1049 10/23/12 1627 10/23/12 2141 10/24/12 0613  GLUCAP 105* 279* 113* 141* 111*    Recent Results (from the past 240 hour(s))  MRSA PCR SCREENING     Status: None   Collection Time    10/17/12  8:08 PM      Result Value Range Status   MRSA by PCR NEGATIVE  NEGATIVE Final   Comment:            The GeneXpert MRSA Assay (FDA     approved for NASAL specimens     only), is one component of a     comprehensive MRSA colonization     surveillance program. It is not     intended to diagnose MRSA     infection nor to guide or     monitor treatment for     MRSA infections.     Studies: No results found.  Scheduled Meds: . albuterol  2.5 mg Nebulization TID  . aspirin  81 mg Oral Daily  . atorvastatin  80 mg Oral Daily  . carvedilol  3.125 mg Oral BID WC  . docusate sodium  100 mg Oral BID  . dutasteride  0.5 mg Oral QPM  . furosemide  40 mg Intravenous Q12H  . heparin  5,000 Units Subcutaneous Q8H  . insulin aspart  0-15 Units Subcutaneous TID WC  . ipratropium  0.5 mg Nebulization TID  . lisinopril  2.5 mg Oral Daily  . senna-docusate  1 tablet Oral BID  . sertraline  50 mg Oral QPM  . sodium chloride  3 mL Intravenous Q12H  . spironolactone  25 mg Oral Daily  . tamsulosin  0.4 mg Oral Q12H  . traZODone  25 mg Oral QHS   Continuous Infusions: . sodium chloride      Principal Problem:   CHF, acute Active Problems:   ARDS (adult respiratory distress syndrome)   Borderline diabetes   Cardiomyopathy, ischemic   Aortic stenosis   AAA (abdominal aortic aneurysm) without rupture   Aneurysm of right iliac artery  Postinflammatory pulmonary fibrosis   COPD (chronic obstructive pulmonary disease)   Pre-operative respiratory  examination    Zanai Mallari  [facility] Pager [phone]. If 7PM-7AM, please contact night-coverage at [email or web address], password [facility] 10/24/2012, 8:01 AM  LOS: 7 days

## 2012-10-25 ENCOUNTER — Inpatient Hospital Stay (HOSPITAL_COMMUNITY): Payer: BC Managed Care – PPO | Admitting: Anesthesiology

## 2012-10-25 ENCOUNTER — Inpatient Hospital Stay (HOSPITAL_COMMUNITY): Payer: BC Managed Care – PPO

## 2012-10-25 ENCOUNTER — Encounter (HOSPITAL_COMMUNITY): Payer: Self-pay | Admitting: Anesthesiology

## 2012-10-25 ENCOUNTER — Encounter (HOSPITAL_COMMUNITY)
Admission: AD | Disposition: A | Discharge status: Skilled Nursing Facility | Payer: Medicare Other | Source: Other Acute Inpatient Hospital | Attending: Thoracic Surgery (Cardiothoracic Vascular Surgery)

## 2012-10-25 DIAGNOSIS — I359 Nonrheumatic aortic valve disorder, unspecified: Secondary | ICD-10-CM

## 2012-10-25 DIAGNOSIS — J95821 Acute postprocedural respiratory failure: Secondary | ICD-10-CM

## 2012-10-25 DIAGNOSIS — I2510 Atherosclerotic heart disease of native coronary artery without angina pectoris: Secondary | ICD-10-CM

## 2012-10-25 DIAGNOSIS — I509 Heart failure, unspecified: Secondary | ICD-10-CM

## 2012-10-25 HISTORY — PX: CORONARY ARTERY BYPASS GRAFT: SHX141

## 2012-10-25 HISTORY — PX: INTRAOPERATIVE TRANSESOPHAGEAL ECHOCARDIOGRAM: SHX5062

## 2012-10-25 HISTORY — PX: AORTIC VALVE REPLACEMENT: SHX41

## 2012-10-25 LAB — POCT I-STAT 4, (NA,K, GLUC, HGB,HCT)
Glucose, Bld: 112 mg/dL — ABNORMAL HIGH (ref 70–99)
Glucose, Bld: 167 mg/dL — ABNORMAL HIGH (ref 70–99)
Glucose, Bld: 146 mg/dL — ABNORMAL HIGH (ref 70–99)
Glucose, Bld: 147 mg/dL — ABNORMAL HIGH (ref 70–99)
HCT: 29 % — ABNORMAL LOW (ref 39.0–52.0)
HCT: 25 % — ABNORMAL LOW (ref 39.0–52.0)
HCT: 24 % — ABNORMAL LOW (ref 39.0–52.0)
HCT: 24 % — ABNORMAL LOW (ref 39.0–52.0)
HCT: 23 % — ABNORMAL LOW (ref 39.0–52.0)
Hemoglobin: 9.9 g/dL — ABNORMAL LOW (ref 13.0–17.0)
Hemoglobin: 7.5 g/dL — ABNORMAL LOW (ref 13.0–17.0)
Hemoglobin: 8.2 g/dL — ABNORMAL LOW (ref 13.0–17.0)
Hemoglobin: 8.2 g/dL — ABNORMAL LOW (ref 13.0–17.0)
Hemoglobin: 7.8 g/dL — ABNORMAL LOW (ref 13.0–17.0)
Potassium: 4.1 mEq/L (ref 3.5–5.1)
Potassium: 4.9 mEq/L (ref 3.5–5.1)
Potassium: 4.7 meq/L (ref 3.5–5.1)
Potassium: 3.6 mEq/L (ref 3.5–5.1)
Sodium: 139 mEq/L (ref 135–145)
Sodium: 137 mEq/L (ref 135–145)
Sodium: 138 mEq/L (ref 135–145)
Sodium: 138 meq/L (ref 135–145)

## 2012-10-25 LAB — POCT I-STAT 3, ART BLOOD GAS (G3+)
Acid-Base Excess: 5 mmol/L — ABNORMAL HIGH (ref 0.0–2.0)
Bicarbonate: 26.4 mEq/L — ABNORMAL HIGH (ref 20.0–24.0)
Bicarbonate: 26.7 mEq/L — ABNORMAL HIGH (ref 20.0–24.0)
Bicarbonate: 25.8 mEq/L — ABNORMAL HIGH (ref 20.0–24.0)
O2 Saturation: 100 %
O2 Saturation: 84 %
O2 Saturation: 88 %
O2 Saturation: 97 %
Patient temperature: 36
Patient temperature: 36
Patient temperature: 36.9
TCO2: 31 mmol/L (ref 0–100)
TCO2: 28 mmol/L (ref 0–100)
TCO2: 28 mmol/L (ref 0–100)
TCO2: 28 mmol/L (ref 0–100)
pCO2 arterial: 52.4 mmHg — ABNORMAL HIGH (ref 35.0–45.0)
pCO2 arterial: 57.6 mmHg (ref 35.0–45.0)
pCO2 arterial: 52.3 mmHg — ABNORMAL HIGH (ref 35.0–45.0)
pCO2 arterial: 55.8 mmHg — ABNORMAL HIGH (ref 35.0–45.0)
pH, Arterial: 7.31 — ABNORMAL LOW (ref 7.350–7.450)
pH, Arterial: 7.274 — ABNORMAL LOW (ref 7.350–7.450)
pH, Arterial: 7.329 — ABNORMAL LOW (ref 7.350–7.450)
pO2, Arterial: 59 mmHg — ABNORMAL LOW (ref 80.0–100.0)
pO2, Arterial: 57 mmHg — ABNORMAL LOW (ref 80.0–100.0)
pO2, Arterial: 107 mmHg — ABNORMAL HIGH (ref 80.0–100.0)

## 2012-10-25 LAB — CBC
HCT: 33.7 % — ABNORMAL LOW (ref 39.0–52.0)
HCT: 28.7 % — ABNORMAL LOW (ref 39.0–52.0)
Hemoglobin: 9.3 g/dL — ABNORMAL LOW (ref 13.0–17.0)
Hemoglobin: 9.4 g/dL — ABNORMAL LOW (ref 13.0–17.0)
MCH: 27.2 pg (ref 26.0–34.0)
MCHC: 32.8 g/dL (ref 30.0–36.0)
MCV: 89.9 fL (ref 78.0–100.0)
MCV: 84.4 fL (ref 78.0–100.0)
Platelets: 182 10*3/uL (ref 150–400)
RBC: 3.75 MIL/uL — ABNORMAL LOW (ref 4.22–5.81)
RBC: 3.41 MIL/uL — ABNORMAL LOW (ref 4.22–5.81)
RDW: 19.8 % — ABNORMAL HIGH (ref 11.5–15.5)
WBC: 5.1 10*3/uL (ref 4.0–10.5)

## 2012-10-25 LAB — POCT I-STAT, CHEM 8
BUN: 17 mg/dL (ref 6–23)
Chloride: 104 mEq/L (ref 96–112)
Glucose, Bld: 173 mg/dL — ABNORMAL HIGH (ref 70–99)
HCT: 27 % — ABNORMAL LOW (ref 39.0–52.0)
Potassium: 5 mEq/L (ref 3.5–5.1)

## 2012-10-25 LAB — GLUCOSE, CAPILLARY
Glucose-Capillary: 92 mg/dL (ref 70–99)
Glucose-Capillary: 97 mg/dL (ref 70–99)

## 2012-10-25 LAB — LIPID PANEL
LDL Cholesterol: 76 mg/dL (ref 0–99)
Triglycerides: 140 mg/dL (ref <150)

## 2012-10-25 LAB — PROTIME-INR
INR: 1.48 (ref 0.00–1.49)
Prothrombin Time: 17.5 seconds — ABNORMAL HIGH (ref 11.6–15.2)

## 2012-10-25 LAB — POCT I-STAT 3, VENOUS BLOOD GAS (G3P V)
Acid-Base Excess: 3 mmol/L — ABNORMAL HIGH (ref 0.0–2.0)
Bicarbonate: 28.1 mEq/L — ABNORMAL HIGH (ref 20.0–24.0)
Patient temperature: 31
TCO2: 29 mmol/L (ref 0–100)

## 2012-10-25 LAB — BASIC METABOLIC PANEL
BUN: 24 mg/dL — ABNORMAL HIGH (ref 6–23)
CO2: 31 mEq/L (ref 19–32)
Calcium: 9.6 mg/dL (ref 8.4–10.5)
Chloride: 101 mEq/L (ref 96–112)
Creatinine, Ser: 0.88 mg/dL (ref 0.50–1.35)

## 2012-10-25 LAB — HEMOGLOBIN A1C: Hgb A1c MFr Bld: 5.5 % (ref <5.7)

## 2012-10-25 LAB — HEMOGLOBIN AND HEMATOCRIT, BLOOD: Hemoglobin: 7.9 g/dL — ABNORMAL LOW (ref 13.0–17.0)

## 2012-10-25 SURGERY — CORONARY ARTERY BYPASS GRAFTING (CABG)
Anesthesia: General | Site: Chest | Wound class: Clean

## 2012-10-25 MED ORDER — SODIUM CHLORIDE 0.9 % IV SOLN
20.0000 mg | INTRAVENOUS | Status: DC | PRN
  Administered 2012-10-25: 25 ug/min via INTRAVENOUS

## 2012-10-25 MED ORDER — OXYCODONE HCL 5 MG PO TABS
5.0000 mg | ORAL_TABLET | ORAL | Status: DC | PRN
  Administered 2012-10-28: 5 mg via ORAL
  Filled 2012-10-25: qty 2

## 2012-10-25 MED ORDER — POTASSIUM CHLORIDE 10 MEQ/50ML IV SOLN
10.0000 meq | INTRAVENOUS | Status: AC
  Administered 2012-10-25 (×3): 10 meq via INTRAVENOUS

## 2012-10-25 MED ORDER — ARTIFICIAL TEARS OP OINT
TOPICAL_OINTMENT | OPHTHALMIC | Status: DC | PRN
  Administered 2012-10-25: 1 via OPHTHALMIC

## 2012-10-25 MED ORDER — MORPHINE SULFATE 2 MG/ML IJ SOLN
1.0000 mg | INTRAMUSCULAR | Status: AC | PRN
  Administered 2012-10-25 (×2): 2 mg via INTRAVENOUS
  Filled 2012-10-25 (×2): qty 2

## 2012-10-25 MED ORDER — NITROGLYCERIN IN D5W 200-5 MCG/ML-% IV SOLN: 0.0000 ug/min | INTRAVENOUS | Status: DC

## 2012-10-25 MED ORDER — DOCUSATE SODIUM 100 MG PO CAPS
200.0000 mg | ORAL_CAPSULE | Freq: Every day | ORAL | Status: DC
  Administered 2012-10-28 – 2012-11-04 (×6): 200 mg via ORAL
  Filled 2012-10-25 (×6): qty 2

## 2012-10-25 MED ORDER — MIDAZOLAM HCL 2 MG/2ML IJ SOLN: 2.0000 mg | INTRAMUSCULAR | Status: DC | PRN

## 2012-10-25 MED ORDER — VECURONIUM BROMIDE 10 MG IV SOLR
INTRAVENOUS | Status: DC | PRN
  Administered 2012-10-25 (×5): 10 mg via INTRAVENOUS

## 2012-10-25 MED ORDER — PANTOPRAZOLE SODIUM 40 MG PO TBEC
40.0000 mg | DELAYED_RELEASE_TABLET | Freq: Every day | ORAL | Status: DC
  Administered 2012-10-27 – 2012-11-04 (×9): 40 mg via ORAL
  Filled 2012-10-25 (×9): qty 1

## 2012-10-25 MED ORDER — SODIUM CHLORIDE 0.9 % IV SOLN: INTRAVENOUS | Status: DC

## 2012-10-25 MED ORDER — BISACODYL 10 MG RE SUPP: 10.0000 mg | Freq: Every day | RECTAL | Status: DC

## 2012-10-25 MED ORDER — LACTATED RINGERS IV SOLN
INTRAVENOUS | Status: DC | PRN
  Administered 2012-10-25: 07:00:00 via INTRAVENOUS

## 2012-10-25 MED ORDER — ALBUMIN HUMAN 5 % IV SOLN
250.0000 mL | INTRAVENOUS | Status: AC | PRN
  Administered 2012-10-25 (×3): 250 mL via INTRAVENOUS
  Filled 2012-10-25: qty 250

## 2012-10-25 MED ORDER — SODIUM CHLORIDE 0.9 % IJ SOLN
3.0000 mL | Freq: Two times a day (BID) | INTRAMUSCULAR | Status: DC
  Administered 2012-10-26 – 2012-10-30 (×5): 3 mL via INTRAVENOUS

## 2012-10-25 MED ORDER — ASPIRIN 81 MG PO CHEW
324.0000 mg | CHEWABLE_TABLET | Freq: Every day | ORAL | Status: DC
  Filled 2012-10-25: qty 1

## 2012-10-25 MED ORDER — METOCLOPRAMIDE HCL 5 MG/ML IJ SOLN
10.0000 mg | Freq: Four times a day (QID) | INTRAMUSCULAR | Status: AC
  Administered 2012-10-26 (×3): 10 mg via INTRAVENOUS
  Filled 2012-10-25 (×4): qty 2

## 2012-10-25 MED ORDER — ACETAMINOPHEN 160 MG/5ML PO SOLN
975.0000 mg | Freq: Four times a day (QID) | ORAL | Status: AC
  Administered 2012-10-26 – 2012-10-28 (×9): 975 mg
  Filled 2012-10-25 (×2): qty 40.6
  Filled 2012-10-25: qty 20.3
  Filled 2012-10-25 (×5): qty 40.6
  Filled 2012-10-25: qty 20.3
  Filled 2012-10-25: qty 40.6

## 2012-10-25 MED ORDER — BISACODYL 5 MG PO TBEC
10.0000 mg | DELAYED_RELEASE_TABLET | Freq: Every day | ORAL | Status: DC
  Administered 2012-10-28 – 2012-11-04 (×3): 10 mg via ORAL
  Filled 2012-10-25 (×3): qty 2

## 2012-10-25 MED ORDER — FAMOTIDINE IN NACL 20-0.9 MG/50ML-% IV SOLN
20.0000 mg | Freq: Two times a day (BID) | INTRAVENOUS | Status: AC
  Administered 2012-10-25 (×2): 20 mg via INTRAVENOUS
  Filled 2012-10-25: qty 50

## 2012-10-25 MED ORDER — SODIUM CHLORIDE 0.45 % IV SOLN
INTRAVENOUS | Status: DC
  Administered 2012-10-25 – 2012-10-26 (×2): via INTRAVENOUS

## 2012-10-25 MED ORDER — INSULIN ASPART 100 UNIT/ML ~~LOC~~ SOLN
0.0000 [IU] | SUBCUTANEOUS | Status: DC
  Administered 2012-10-26 – 2012-10-27 (×7): 2 [IU] via SUBCUTANEOUS
  Administered 2012-10-28 (×2): 4 [IU] via SUBCUTANEOUS
  Administered 2012-10-28 – 2012-10-29 (×4): 2 [IU] via SUBCUTANEOUS
  Administered 2012-10-29 – 2012-10-30 (×2): 4 [IU] via SUBCUTANEOUS
  Administered 2012-10-30 (×2): 2 [IU] via SUBCUTANEOUS

## 2012-10-25 MED ORDER — NOREPINEPHRINE BITARTRATE 1 MG/ML IJ SOLN
2.0000 ug/min | INTRAVENOUS | Status: DC
  Administered 2012-10-25: 5 ug/min via INTRAVENOUS
  Filled 2012-10-25 (×2): qty 4

## 2012-10-25 MED ORDER — MIDAZOLAM HCL 5 MG/5ML IJ SOLN
INTRAMUSCULAR | Status: DC | PRN
  Administered 2012-10-25: 2 mg via INTRAVENOUS
  Administered 2012-10-25: 5 mg via INTRAVENOUS
  Administered 2012-10-25: 3 mg via INTRAVENOUS
  Administered 2012-10-25: 2 mg via INTRAVENOUS
  Administered 2012-10-25: 5 mg via INTRAVENOUS
  Administered 2012-10-25: 4 mg via INTRAVENOUS
  Administered 2012-10-25: 1 mg via INTRAVENOUS
  Administered 2012-10-25: 2 mg via INTRAVENOUS

## 2012-10-25 MED ORDER — SODIUM CHLORIDE 0.9 % IV SOLN: 250.0000 mL | INTRAVENOUS | Status: DC

## 2012-10-25 MED ORDER — ALBUMIN HUMAN 5 % IV SOLN
INTRAVENOUS | Status: DC | PRN
  Administered 2012-10-25 (×2): via INTRAVENOUS

## 2012-10-25 MED ORDER — ALBUTEROL SULFATE HFA 108 (90 BASE) MCG/ACT IN AERS
INHALATION_SPRAY | RESPIRATORY_TRACT | Status: DC | PRN
  Administered 2012-10-25: 2 via RESPIRATORY_TRACT
  Administered 2012-10-25 (×2): 4 via RESPIRATORY_TRACT

## 2012-10-25 MED ORDER — VANCOMYCIN HCL IN DEXTROSE 1-5 GM/200ML-% IV SOLN
1000.0000 mg | Freq: Once | INTRAVENOUS | Status: AC
  Administered 2012-10-25: 1000 mg via INTRAVENOUS
  Filled 2012-10-25: qty 200

## 2012-10-25 MED ORDER — DOPAMINE-DEXTROSE 3.2-5 MG/ML-% IV SOLN: 2.0000 ug/kg/min | INTRAVENOUS | Status: AC

## 2012-10-25 MED ORDER — ONDANSETRON HCL 4 MG/2ML IJ SOLN: 4.0000 mg | Freq: Four times a day (QID) | INTRAMUSCULAR | Status: DC | PRN

## 2012-10-25 MED ORDER — LIDOCAINE HCL (CARDIAC) 20 MG/ML IV SOLN
INTRAVENOUS | Status: DC | PRN
  Administered 2012-10-25: 80 mg via INTRAVENOUS

## 2012-10-25 MED ORDER — ACETAMINOPHEN 10 MG/ML IV SOLN
1000.0000 mg | Freq: Once | INTRAVENOUS | Status: AC
  Administered 2012-10-25: 1000 mg via INTRAVENOUS
  Filled 2012-10-25: qty 100

## 2012-10-25 MED ORDER — IPRATROPIUM-ALBUTEROL 20-100 MCG/ACT IN AERS
2.0000 | INHALATION_SPRAY | RESPIRATORY_TRACT | Status: DC
  Filled 2012-10-25: qty 4

## 2012-10-25 MED ORDER — PROTAMINE SULFATE 10 MG/ML IV SOLN
INTRAVENOUS | Status: DC | PRN
  Administered 2012-10-25: 380 mg via INTRAVENOUS

## 2012-10-25 MED ORDER — INSULIN ASPART 100 UNIT/ML ~~LOC~~ SOLN
0.0000 [IU] | SUBCUTANEOUS | Status: AC
  Administered 2012-10-25: 4 [IU] via SUBCUTANEOUS
  Administered 2012-10-25: 2 [IU] via SUBCUTANEOUS

## 2012-10-25 MED ORDER — SUCCINYLCHOLINE CHLORIDE 20 MG/ML IJ SOLN
INTRAMUSCULAR | Status: DC | PRN
  Administered 2012-10-25: 140 mg via INTRAVENOUS

## 2012-10-25 MED ORDER — SODIUM CHLORIDE 0.9 % IV SOLN
INTRAVENOUS | Status: DC | PRN
  Administered 2012-10-25: 08:00:00 via INTRAVENOUS

## 2012-10-25 MED ORDER — LACTATED RINGERS IV SOLN: INTRAVENOUS | Status: DC

## 2012-10-25 MED ORDER — MAGNESIUM SULFATE 40 MG/ML IJ SOLN
4.0000 g | Freq: Once | INTRAMUSCULAR | Status: AC
  Administered 2012-10-25: 4 g via INTRAVENOUS
  Filled 2012-10-25: qty 100

## 2012-10-25 MED ORDER — INSULIN REGULAR BOLUS VIA INFUSION
0.0000 [IU] | Freq: Three times a day (TID) | INTRAVENOUS | Status: DC
  Filled 2012-10-25: qty 10

## 2012-10-25 MED ORDER — DEXTROSE 5 % IV SOLN
INTRAVENOUS | Status: DC | PRN
  Administered 2012-10-25: 08:00:00 via INTRAVENOUS

## 2012-10-25 MED ORDER — MORPHINE SULFATE 2 MG/ML IJ SOLN
2.0000 mg | INTRAMUSCULAR | Status: DC | PRN
  Administered 2012-10-26: 4 mg via INTRAVENOUS

## 2012-10-25 MED ORDER — POTASSIUM CHLORIDE 10 MEQ/50ML IV SOLN
10.0000 meq | Freq: Once | INTRAVENOUS | Status: AC
  Administered 2012-10-25: 10 meq via INTRAVENOUS

## 2012-10-25 MED ORDER — METOPROLOL TARTRATE 12.5 MG HALF TABLET
12.5000 mg | ORAL_TABLET | Freq: Two times a day (BID) | ORAL | Status: DC
  Administered 2012-10-28 – 2012-10-29 (×3): 12.5 mg via ORAL
  Filled 2012-10-25 (×11): qty 1

## 2012-10-25 MED ORDER — ALBUTEROL SULFATE (5 MG/ML) 0.5% IN NEBU
2.5000 mg | INHALATION_SOLUTION | RESPIRATORY_TRACT | Status: DC
  Administered 2012-10-25 – 2012-10-29 (×21): 2.5 mg via RESPIRATORY_TRACT
  Filled 2012-10-25 (×22): qty 0.5

## 2012-10-25 MED ORDER — HEMOSTATIC AGENTS (NO CHARGE) OPTIME
TOPICAL | Status: DC | PRN
  Administered 2012-10-25: 1 via TOPICAL

## 2012-10-25 MED ORDER — LACTATED RINGERS IV SOLN: 500.0000 mL | Freq: Once | INTRAVENOUS | Status: AC | PRN

## 2012-10-25 MED ORDER — PHENYLEPHRINE HCL 10 MG/ML IJ SOLN
0.0000 ug/min | INTRAVENOUS | Status: DC
  Administered 2012-10-26: 45 ug/min via INTRAVENOUS
  Filled 2012-10-25 (×4): qty 2

## 2012-10-25 MED ORDER — SODIUM CHLORIDE 0.9 % IJ SOLN
OROMUCOSAL | Status: DC | PRN
  Administered 2012-10-25 (×2): via TOPICAL

## 2012-10-25 MED ORDER — SODIUM CHLORIDE 0.9 % IV SOLN
INTRAVENOUS | Status: DC
  Administered 2012-10-25: 0.3 [IU]/h via INTRAVENOUS
  Filled 2012-10-25: qty 1

## 2012-10-25 MED ORDER — FENTANYL CITRATE 0.05 MG/ML IJ SOLN
INTRAMUSCULAR | Status: DC | PRN
  Administered 2012-10-25: 500 ug via INTRAVENOUS
  Administered 2012-10-25 (×3): 250 ug via INTRAVENOUS
  Administered 2012-10-25: 100 ug via INTRAVENOUS
  Administered 2012-10-25: 400 ug via INTRAVENOUS
  Administered 2012-10-25: 250 ug via INTRAVENOUS

## 2012-10-25 MED ORDER — DEXMEDETOMIDINE HCL IN NACL 200 MCG/50ML IV SOLN
0.1000 ug/kg/h | INTRAVENOUS | Status: DC
  Administered 2012-10-25 (×2): 0.7 ug/kg/h via INTRAVENOUS
  Filled 2012-10-25 (×5): qty 50

## 2012-10-25 MED ORDER — ACETAMINOPHEN 500 MG PO TABS
1000.0000 mg | ORAL_TABLET | Freq: Four times a day (QID) | ORAL | Status: AC
  Administered 2012-10-26 – 2012-10-30 (×11): 1000 mg via ORAL
  Filled 2012-10-25 (×19): qty 2

## 2012-10-25 MED ORDER — SODIUM CHLORIDE 0.9 % IJ SOLN: 3.0000 mL | INTRAMUSCULAR | Status: DC | PRN

## 2012-10-25 MED ORDER — IPRATROPIUM BROMIDE 0.02 % IN SOLN
0.5000 mg | RESPIRATORY_TRACT | Status: DC
  Administered 2012-10-25: 0.5 mg via RESPIRATORY_TRACT
  Filled 2012-10-25: qty 2.5

## 2012-10-25 MED ORDER — METOPROLOL TARTRATE 25 MG/10 ML ORAL SUSPENSION
12.5000 mg | Freq: Two times a day (BID) | ORAL | Status: DC
  Administered 2012-10-27: 12.5 mg
  Filled 2012-10-25 (×11): qty 5

## 2012-10-25 MED ORDER — HEPARIN SODIUM (PORCINE) 1000 UNIT/ML IJ SOLN
INTRAMUSCULAR | Status: DC | PRN
  Administered 2012-10-25: 2000 [IU] via INTRAVENOUS
  Administered 2012-10-25: 37000 [IU] via INTRAVENOUS

## 2012-10-25 MED ORDER — CALCIUM CHLORIDE 10 % IV SOLN
1.0000 g | Freq: Once | INTRAVENOUS | Status: AC
  Administered 2012-10-25: 1 g via INTRAVENOUS

## 2012-10-25 MED ORDER — METOPROLOL TARTRATE 1 MG/ML IV SOLN
2.5000 mg | INTRAVENOUS | Status: DC | PRN
  Filled 2012-10-25: qty 5

## 2012-10-25 MED ORDER — ETOMIDATE 2 MG/ML IV SOLN
INTRAVENOUS | Status: DC | PRN
  Administered 2012-10-25: 16 mg via INTRAVENOUS

## 2012-10-25 MED ORDER — ASPIRIN EC 325 MG PO TBEC
325.0000 mg | DELAYED_RELEASE_TABLET | Freq: Every day | ORAL | Status: DC
  Administered 2012-10-28 – 2012-11-04 (×8): 325 mg via ORAL
  Filled 2012-10-25 (×10): qty 1

## 2012-10-25 MED ORDER — 0.9 % SODIUM CHLORIDE (POUR BTL) OPTIME
TOPICAL | Status: DC | PRN
  Administered 2012-10-25: 1000 mL

## 2012-10-25 MED FILL — Magnesium Sulfate Inj 50%: INTRAMUSCULAR | Qty: 10 | Status: AC

## 2012-10-25 MED FILL — Potassium Chloride Inj 2 mEq/ML: INTRAVENOUS | Qty: 40 | Status: AC

## 2012-10-25 SURGICAL SUPPLY — 108 items
ADAPTER CARDIO PERF ANTE/RETRO (ADAPTER) ×3 IMPLANT
APPLICATOR COTTON TIP 6IN STRL (MISCELLANEOUS) IMPLANT
ATTRACTOMAT 16X20 MAGNETIC DRP (DRAPES) ×3 IMPLANT
BAG DECANTER FOR FLEXI CONT (MISCELLANEOUS) ×3 IMPLANT
BANDAGE ELASTIC 4 VELCRO ST LF (GAUZE/BANDAGES/DRESSINGS) ×6 IMPLANT
BANDAGE ELASTIC 6 VELCRO ST LF (GAUZE/BANDAGES/DRESSINGS) ×6 IMPLANT
BANDAGE GAUZE ELAST BULKY 4 IN (GAUZE/BANDAGES/DRESSINGS) ×6 IMPLANT
BASKET HEART (ORDER IN 25'S) (MISCELLANEOUS) ×1
BASKET HEART (ORDER IN 25S) (MISCELLANEOUS) ×2 IMPLANT
BENZOIN TINCTURE PRP APPL 2/3 (GAUZE/BANDAGES/DRESSINGS) ×3 IMPLANT
BLADE STERNUM SYSTEM 6 (BLADE) ×3 IMPLANT
BLADE SURG 15 STRL LF DISP TIS (BLADE) ×2 IMPLANT
BLADE SURG 15 STRL SS (BLADE) ×1
CANISTER SUCTION 2500CC (MISCELLANEOUS) ×3 IMPLANT
CANNULA GUNDRY RCSP 15FR (MISCELLANEOUS) ×3 IMPLANT
CATH CPB KIT HENDRICKSON (MISCELLANEOUS) ×3 IMPLANT
CATH HEART VENT LEFT (CATHETERS) ×2 IMPLANT
CATH ROBINSON RED A/P 18FR (CATHETERS) ×6 IMPLANT
CATH THORACIC 36FR (CATHETERS) ×3 IMPLANT
CATH THORACIC 36FR RT ANG (CATHETERS) IMPLANT
CLIP FOGARTY SPRING 6M (CLIP) IMPLANT
CLIP TI MEDIUM 24 (CLIP) IMPLANT
CLIP TI WIDE RED SMALL 24 (CLIP) ×6 IMPLANT
CLOTH BEACON ORANGE TIMEOUT ST (SAFETY) IMPLANT
CONT SPEC 4OZ CLIKSEAL STRL BL (MISCELLANEOUS) ×3 IMPLANT
COVER MAYO STAND STRL (DRAPES) ×3 IMPLANT
COVER SURGICAL LIGHT HANDLE (MISCELLANEOUS) ×3 IMPLANT
CRADLE DONUT ADULT HEAD (MISCELLANEOUS) ×3 IMPLANT
DRAIN CHANNEL 32F RND 10.7 FF (WOUND CARE) ×3 IMPLANT
DRAPE CARDIOVASCULAR INCISE (DRAPES) ×1
DRAPE SLUSH MACHINE 52X66 (DRAPES) IMPLANT
DRAPE SLUSH/WARMER DISC (DRAPES) ×3 IMPLANT
DRAPE SRG 135X102X78XABS (DRAPES) ×2 IMPLANT
DRSG COVADERM 4X14 (GAUZE/BANDAGES/DRESSINGS) ×3 IMPLANT
ELECT BLADE 4.0 EZ CLEAN MEGAD (MISCELLANEOUS) ×3
ELECT REM PT RETURN 9FT ADLT (ELECTROSURGICAL) ×6
ELECTRODE BLDE 4.0 EZ CLN MEGD (MISCELLANEOUS) ×2 IMPLANT
ELECTRODE REM PT RTRN 9FT ADLT (ELECTROSURGICAL) ×4 IMPLANT
GLOVE EUDERMIC 7 POWDERFREE (GLOVE) ×9 IMPLANT
GOWN PREVENTION PLUS XLARGE (GOWN DISPOSABLE) ×6 IMPLANT
GOWN STRL NON-REIN LRG LVL3 (GOWN DISPOSABLE) ×24 IMPLANT
HEMOSTAT POWDER SURGIFOAM 1G (HEMOSTASIS) ×9 IMPLANT
HEMOSTAT SURGICEL 2X14 (HEMOSTASIS) ×3 IMPLANT
INSERT FOGARTY XLG (MISCELLANEOUS) ×3 IMPLANT
KIT BASIN OR (CUSTOM PROCEDURE TRAY) ×3 IMPLANT
KIT ROOM TURNOVER OR (KITS) ×3 IMPLANT
KIT SUCTION CATH 14FR (SUCTIONS) ×6 IMPLANT
KIT VASOVIEW W/TROCAR VH 2000 (KITS) ×3 IMPLANT
LINE VENT (MISCELLANEOUS) ×3 IMPLANT
MARKER GRAFT CORONARY BYPASS (MISCELLANEOUS) ×9 IMPLANT
NS IRRIG 1000ML POUR BTL (IV SOLUTION) ×15 IMPLANT
PACK OPEN HEART (CUSTOM PROCEDURE TRAY) ×3 IMPLANT
PAD ARMBOARD 7.5X6 YLW CONV (MISCELLANEOUS) ×6 IMPLANT
PENCIL BUTTON HOLSTER BLD 10FT (ELECTRODE) ×3 IMPLANT
PUNCH AORTIC ROTATE 4.0MM (MISCELLANEOUS) IMPLANT
PUNCH AORTIC ROTATE 4.5MM 8IN (MISCELLANEOUS) ×3 IMPLANT
PUNCH AORTIC ROTATE 5MM 8IN (MISCELLANEOUS) IMPLANT
SET CARDIOPLEGIA MPS 5001102 (MISCELLANEOUS) ×3 IMPLANT
SPONGE GAUZE 4X4 12PLY (GAUZE/BANDAGES/DRESSINGS) ×6 IMPLANT
SPONGE LAP 18X18 X RAY DECT (DISPOSABLE) ×3 IMPLANT
SPONGE LAP 4X18 X RAY DECT (DISPOSABLE) ×3 IMPLANT
STRIP CLOSURE SKIN 1/2X4 (GAUZE/BANDAGES/DRESSINGS) ×6 IMPLANT
SUT BONE WAX W31G (SUTURE) ×3 IMPLANT
SUT ETHIBON 2 0 V 52N 30 (SUTURE) ×6 IMPLANT
SUT ETHIBON EXCEL 2-0 V-5 (SUTURE) IMPLANT
SUT ETHIBOND 2 0 SH (SUTURE)
SUT ETHIBOND 2 0 SH 36X2 (SUTURE) IMPLANT
SUT ETHIBOND 2 0 V4 (SUTURE) IMPLANT
SUT ETHIBOND 2 0V4 GREEN (SUTURE) IMPLANT
SUT ETHIBOND 4 0 RB 1 (SUTURE) IMPLANT
SUT ETHIBOND V-5 VALVE (SUTURE) IMPLANT
SUT MNCRL AB 4-0 PS2 18 (SUTURE) ×6 IMPLANT
SUT PROLENE 3 0 SH 1 (SUTURE) IMPLANT
SUT PROLENE 3 0 SH DA (SUTURE) ×3 IMPLANT
SUT PROLENE 4 0 RB 1 (SUTURE) ×6
SUT PROLENE 4 0 SH DA (SUTURE) IMPLANT
SUT PROLENE 4-0 RB1 .5 CRCL 36 (SUTURE) ×12 IMPLANT
SUT PROLENE 5 0 C 1 36 (SUTURE) ×3 IMPLANT
SUT PROLENE 6 0 C 1 30 (SUTURE) ×18 IMPLANT
SUT PROLENE 7 0 BV 1 (SUTURE) ×9 IMPLANT
SUT PROLENE 7 0 BV1 MDA (SUTURE) ×6 IMPLANT
SUT PROLENE 8 0 BV175 6 (SUTURE) ×15 IMPLANT
SUT SILK  1 MH (SUTURE)
SUT SILK 1 MH (SUTURE) IMPLANT
SUT STEEL 6MS V (SUTURE) IMPLANT
SUT STEEL STERNAL CCS#1 18IN (SUTURE) ×3 IMPLANT
SUT STEEL SZ 6 DBL 3X14 BALL (SUTURE) ×3 IMPLANT
SUT VIC AB 1 CTX 36 (SUTURE) ×2
SUT VIC AB 1 CTX36XBRD ANBCTR (SUTURE) ×4 IMPLANT
SUT VIC AB 2-0 CT1 27 (SUTURE) ×2
SUT VIC AB 2-0 CT1 TAPERPNT 27 (SUTURE) ×4 IMPLANT
SUT VIC AB 2-0 CTX 27 (SUTURE) IMPLANT
SUT VIC AB 3-0 SH 27 (SUTURE)
SUT VIC AB 3-0 SH 27X BRD (SUTURE) IMPLANT
SUT VIC AB 3-0 X1 27 (SUTURE) IMPLANT
SUT VICRYL 4-0 PS2 18IN ABS (SUTURE) IMPLANT
SUTURE E-PAK OPEN HEART (SUTURE) ×3 IMPLANT
SYR 10ML KIT SKIN ADHESIVE (MISCELLANEOUS) IMPLANT
SYSTEM SAHARA CHEST DRAIN ATS (WOUND CARE) ×3 IMPLANT
TOWEL OR 17X24 6PK STRL BLUE (TOWEL DISPOSABLE) ×12 IMPLANT
TOWEL OR 17X26 10 PK STRL BLUE (TOWEL DISPOSABLE) ×12 IMPLANT
TRAY FOLEY IC TEMP SENS 14FR (CATHETERS) ×3 IMPLANT
TUBE FEEDING 8FR 16IN STR KANG (MISCELLANEOUS) ×3 IMPLANT
TUBING INSUFFLATION 10FT LAP (TUBING) ×3 IMPLANT
UNDERPAD 30X30 INCONTINENT (UNDERPADS AND DIAPERS) ×3 IMPLANT
VALVE MAGNA EASE 21MM (Prosthesis & Implant Heart) ×3 IMPLANT
VENT LEFT HEART 12002 (CATHETERS) ×3
WATER STERILE IRR 1000ML POUR (IV SOLUTION) ×6 IMPLANT

## 2012-10-25 NOTE — Progress Notes (Signed)
Increased RR to 16 per Md due to >CO2

## 2012-10-25 NOTE — H&P (View-Only) (Signed)
Reason for Consult:3 vessel CAD, moderately severe AS Referring Physician: Dr. Kathalene Pierce is an 72 y.o. male.  HPI: 72 yo male with a complex recent medical history who presented with swelling in his feet and legs and shortness of breath.  [name] is a 72 yo male with no prior history of CAD or aortic stenosis. He does have a history of heavy tobacco abuse and COPD, as well as sleep apnea and "borderline" type II DM. He was in his usual state of health and still working until he became ill in January of this year. He was admitted to [facility] in January with H1N1 and developed pneumonia and ARDS. He was intubated for about 2 weeks. 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 He required a percutaneous drain for a parapneumonic effusion on the left. He required a tracheostomy and ultimately went to [facility] where he was weaned and decannulated.   After a few weeks at [facility] he was sent to [facility] for further rehabilitation prio to going home. He was dc'ed from [facility] on April 4th. He says that he started gaining weight (21 pounds in 2 weeks) while at [facility]. He was sent to [facility] and then transferred to [facility] for treatment of class IV CHF. He improved symptomatically with diuresis. An echocardiogram on 4/18 showed a dilated hypokinetic LV with an EF of 30-40% and severe AS with a valve area of 0.63. A repeat echo on 4/22 showed similar findings with mean gradient of 33 and peak of 50 mm Hg c/w moderate stenosis. 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  Past Medical History  Diagnosis Date  . Hypertension   . Hyperlipidemia   . OSA (obstructive sleep apnea)     on cpap  . Prediabetes   . CHF (congestive heart failure) 07/2012; 10/17/2012  . COPD (chronic obstructive pulmonary disease)   . Pneumonia 07/2012    "H1N1; ARDS; double pneumonia" (10/17/2012)  . Shortness of breath     "all the time;  related to CHF" (10/17/2012)  . Diabetes mellitus without complication     "prediabetic; been getting insulin while in SNF" (10/17/2012)  . History of blood transfusion     "lots since January" (10/17/2012)  . Anxiety     Past Surgical History  Procedure Laterality Date  . Transurethral microwave therapy  10/15/2012  . Nasal fracture surgery  1970's  . Tracheostomy  07/2012  . Tracheostomy closure  08/2012  . Anal fissure repair  2008    History reviewed. No pertinent family history.  Social History:  reports that he quit smoking about 3 months ago. His smoking use included Cigarettes. He has a 116 pack-year smoking history. He has never used smokeless tobacco. He reports that he does not drink alcohol or use illicit drugs.  Allergies:  Allergies  Allergen Reactions  . Cephalosporins Rash    Medications:  Prior to Admission:  Prescriptions prior to admission  Medication Sig Dispense Refill  . acetaminophen (TYLENOL) 325 MG tablet Take 650 mg by mouth every 8 (eight) hours as needed for pain.      Marland Kitchen atorvastatin (LIPITOR) 20 MG tablet Take 20 mg by mouth daily.      . clonazePAM (KLONOPIN) 0.5 MG tablet Take 0.5 mg by mouth 3 (three) times daily as needed for anxiety.      Marland Kitchen dexamethasone (DECADRON) 4 MG tablet Take 4  mg by mouth daily with breakfast.      . docusate sodium (COLACE) 100 MG capsule Take 100 mg by mouth 2 (two) times daily.      Marland Kitchen dutasteride (AVODART) 0.5 MG capsule Take 0.5 mg by mouth every evening.      Marland Kitchen guaifenesin (GERI-TUSSIN) 100 MG/5ML syrup Take 300 mg by mouth every 4 (four) hours as needed for cough.      . insulin regular (NOVOLIN R,HUMULIN R) 100 units/mL injection Inject 0-10 Units into the skin every 6 (six) hours.      Marland Kitchen ipratropium-albuterol (DUONEB) 0.5-2.5 (3) MG/3ML SOLN Take 3 mLs by nebulization every 6 (six) hours.      Marland Kitchen lactulose (CHRONULAC) 10 GM/15ML solution Take 20 g by mouth daily as needed (constipation).      . metoprolol tartrate  (LOPRESSOR) 25 MG tablet Take 12.5 mg by mouth 2 (two) times daily.      Marland Kitchen senna-docusate (SENOKOT-S) 8.6-50 MG per tablet Take 1 tablet by mouth 2 (two) times daily.      . sertraline (ZOLOFT) 50 MG tablet Take 50 mg by mouth every evening.      . tamsulosin (FLOMAX) 0.4 MG CAPS Take 0.4 mg by mouth every 12 (twelve) hours.      . torsemide (DEMADEX) 20 MG tablet Take 20 mg by mouth 2 (two) times daily.      . traZODone (DESYREL) 50 MG tablet Take 25 mg by mouth at bedtime.        Results for orders placed during the hospital encounter of 10/17/12 (from the past 48 hour(s))  GLUCOSE, CAPILLARY     Status: Abnormal   Collection Time    10/21/12  4:20 PM      Result Value Range   Glucose-Capillary 165 (*) 70 - 99 mg/dL   Comment 1 Notify RN    GLUCOSE, CAPILLARY     Status: Abnormal   Collection Time    10/21/12  9:20 PM      Result Value Range   Glucose-Capillary 136 (*) 70 - 99 mg/dL   Comment 1 Documented in Chart     Comment 2 Notify RN    BASIC METABOLIC PANEL     Status: Abnormal   Collection Time    10/22/12  5:30 AM      Result Value Range   Sodium 142  135 - 145 mEq/L   Potassium 3.4 (*) 3.5 - 5.1 mEq/L   Comment: DELTA CHECK NOTED   Chloride 101  96 - 112 mEq/L   CO2 35 (*) 19 - 32 mEq/L   Glucose, Bld 104 (*) 70 - 99 mg/dL   BUN 28 (*) 6 - 23 mg/dL   Creatinine, Ser 0.77  0.50 - 1.35 mg/dL   Calcium 9.1  8.4 - 10.5 mg/dL   GFR calc non Af Amer 90 (*) >90 mL/min   GFR calc Af Amer >90  >90 mL/min   Comment:            The eGFR has been calculated     using the CKD EPI equation.     This calculation has not been     validated in all clinical     situations.     eGFR's persistently     <90 mL/min signify     possible Chronic Kidney Disease.  CBC     Status: Abnormal   Collection Time    10/22/12  5:30 AM      Result  Value Range   WBC 5.9  4.0 - 10.5 K/uL   RBC 3.60 (*) 4.22 - 5.81 MIL/uL   Hemoglobin 10.0 (*) 13.0 - 17.0 g/dL   HCT 32.5 (*) 39.0 - 52.0 %    MCV 90.3  78.0 - 100.0 fL   MCH 27.8  26.0 - 34.0 pg   MCHC 30.8  30.0 - 36.0 g/dL   RDW 18.5 (*) 11.5 - 15.5 %   Platelets 203  150 - 400 K/uL  GLUCOSE, CAPILLARY     Status: Abnormal   Collection Time    10/22/12  5:48 AM      Result Value Range   Glucose-Capillary 108 (*) 70 - 99 mg/dL   Comment 1 Notify RN    GLUCOSE, CAPILLARY     Status: Abnormal   Collection Time    10/22/12 11:06 AM      Result Value Range   Glucose-Capillary 148 (*) 70 - 99 mg/dL   Comment 1 Notify RN    POCT I-STAT 3, BLOOD GAS (G3P V)     Status: Abnormal   Collection Time    10/22/12  1:05 PM      Result Value Range   pH, Ven 7.379 (*) 7.250 - 7.300   pCO2, Ven 55.3 (*) 45.0 - 50.0 mmHg   pO2, Ven 37.0  30.0 - 45.0 mmHg   Bicarbonate 32.6 (*) 20.0 - 24.0 mEq/L   TCO2 34  0 - 100 mmol/L   O2 Saturation 68.0     Acid-Base Excess 6.0 (*) 0.0 - 2.0 mmol/L   Sample type VENOUS     Comment NOTIFIED PHYSICIAN    POCT I-STAT 3, BLOOD GAS (G3+)     Status: Abnormal   Collection Time    10/22/12  1:17 PM      Result Value Range   pH, Arterial 7.428  7.350 - 7.450   pCO2 arterial 47.9 (*) 35.0 - 45.0 mmHg   pO2, Arterial 98.0  80.0 - 100.0 mmHg   Bicarbonate 31.7 (*) 20.0 - 24.0 mEq/L   TCO2 33  0 - 100 mmol/L   O2 Saturation 98.0     Acid-Base Excess 6.0 (*) 0.0 - 2.0 mmol/L   Sample type ARTERIAL    GLUCOSE, CAPILLARY     Status: Abnormal   Collection Time    10/22/12  1:56 PM      Result Value Range   Glucose-Capillary 123 (*) 70 - 99 mg/dL  CBC     Status: Abnormal   Collection Time    10/22/12  3:47 PM      Result Value Range   WBC 6.2  4.0 - 10.5 K/uL   RBC 3.91 (*) 4.22 - 5.81 MIL/uL   Hemoglobin 10.8 (*) 13.0 - 17.0 g/dL   HCT 35.5 (*) 39.0 - 52.0 %   MCV 90.8  78.0 - 100.0 fL   MCH 27.6  26.0 - 34.0 pg   MCHC 30.4  30.0 - 36.0 g/dL   RDW 18.5 (*) 11.5 - 15.5 %   Platelets 158  150 - 400 K/uL  CREATININE, SERUM     Status: None   Collection Time    10/22/12  3:47 PM      Result  Value Range   Creatinine, Ser 0.70  0.50 - 1.35 mg/dL   GFR calc non Af Amer >90  >90 mL/min   GFR calc Af Amer >90  >90 mL/min   Comment:  The eGFR has been calculated     using the CKD EPI equation.     This calculation has not been     validated in all clinical     situations.     eGFR's persistently     <90 mL/min signify     possible Chronic Kidney Disease.  GLUCOSE, CAPILLARY     Status: Abnormal   Collection Time    10/22/12  4:34 PM      Result Value Range   Glucose-Capillary 153 (*) 70 - 99 mg/dL   Comment 1 Notify RN    GLUCOSE, CAPILLARY     Status: Abnormal   Collection Time    10/22/12  9:04 PM      Result Value Range   Glucose-Capillary 193 (*) 70 - 99 mg/dL   Comment 1 Notify RN    BASIC METABOLIC PANEL     Status: Abnormal   Collection Time    10/23/12  6:20 AM      Result Value Range   Sodium 140  135 - 145 mEq/L   Potassium 3.7  3.5 - 5.1 mEq/L   Chloride 103  96 - 112 mEq/L   CO2 30  19 - 32 mEq/L   Glucose, Bld 94  70 - 99 mg/dL   BUN 27 (*) 6 - 23 mg/dL   Creatinine, Ser 0.79  0.50 - 1.35 mg/dL   Calcium 8.8  8.4 - 10.5 mg/dL   GFR calc non Af Amer 89 (*) >90 mL/min   GFR calc Af Amer >90  >90 mL/min   Comment:            The eGFR has been calculated     using the CKD EPI equation.     This calculation has not been     validated in all clinical     situations.     eGFR's persistently     <90 mL/min signify     possible Chronic Kidney Disease.  CBC     Status: Abnormal   Collection Time    10/23/12  6:20 AM      Result Value Range   WBC 6.2  4.0 - 10.5 K/uL   RBC 3.52 (*) 4.22 - 5.81 MIL/uL   Hemoglobin 9.5 (*) 13.0 - 17.0 g/dL   HCT 31.7 (*) 39.0 - 52.0 %   MCV 90.1  78.0 - 100.0 fL   MCH 27.0  26.0 - 34.0 pg   MCHC 30.0  30.0 - 36.0 g/dL   RDW 18.5 (*) 11.5 - 15.5 %   Platelets 196  150 - 400 K/uL  GLUCOSE, CAPILLARY     Status: Abnormal   Collection Time    10/23/12  6:26 AM      Result Value Range   Glucose-Capillary  105 (*) 70 - 99 mg/dL  BASIC METABOLIC PANEL     Status: Abnormal   Collection Time    10/23/12 10:47 AM      Result Value Range   Sodium 137  135 - 145 mEq/L   Potassium 3.9  3.5 - 5.1 mEq/L   Chloride 100  96 - 112 mEq/L   CO2 28  19 - 32 mEq/L   Glucose, Bld 280 (*) 70 - 99 mg/dL   BUN 26 (*) 6 - 23 mg/dL   Creatinine, Ser 0.79  0.50 - 1.35 mg/dL   Calcium 8.9  8.4 - 10.5 mg/dL   GFR calc non Af Amer 89 (*) >90  mL/min   GFR calc Af Amer >90  >90 mL/min   Comment:            The eGFR has been calculated     using the CKD EPI equation.     This calculation has not been     validated in all clinical     situations.     eGFR's persistently     <90 mL/min signify     possible Chronic Kidney Disease.  CBC     Status: Abnormal   Collection Time    10/23/12 10:47 AM      Result Value Range   WBC 6.6  4.0 - 10.5 K/uL   RBC 3.47 (*) 4.22 - 5.81 MIL/uL   Hemoglobin 9.6 (*) 13.0 - 17.0 g/dL   HCT 30.9 (*) 39.0 - 52.0 %   MCV 89.0  78.0 - 100.0 fL   MCH 27.7  26.0 - 34.0 pg   MCHC 31.1  30.0 - 36.0 g/dL   RDW 18.7 (*) 11.5 - 15.5 %   Platelets 177  150 - 400 K/uL   Comment: PLATELET COUNT CONFIRMED BY SMEAR  GLUCOSE, CAPILLARY     Status: Abnormal   Collection Time    10/23/12 10:49 AM      Result Value Range   Glucose-Capillary 279 (*) 70 - 99 mg/dL   Comment 1 Notify RN      No results found.  Review of Systems  Constitutional: Positive for fever (with illness in January), chills and malaise/fatigue. Negative for weight loss.  Respiratory: Positive for cough, shortness of breath and wheezing.        Sleep apnea  Cardiovascular: Positive for orthopnea and leg swelling. Negative for chest pain.  Genitourinary: Positive for frequency.       Indwelling foley after prostate procedure- BPH, ? Bladder tumor  Neurological: Positive for weakness.  All other systems reviewed and are negative.   Blood pressure 98/60, pulse 88, temperature 97.9 F (36.6 C), temperature source  Oral, resp. rate 20, height 5\' 6"  (1.676 m), weight 202 lb 8 oz (91.853 kg), SpO2 98.00%. Physical Exam  Vitals reviewed. Constitutional: He is oriented to person, place, and time. He appears well-developed. No distress.  HENT:  Head: Normocephalic and atraumatic.  Eyes: EOM are normal. Pupils are equal, round, and reactive to light.  Neck: Neck supple. No thyromegaly present.  No audible bruit  Cardiovascular: Normal rate and regular rhythm.   Murmur (faint systolic murmur) heard. Respiratory: He has no wheezes. He has no rales.  Diminished BS bilaterally  GI: Soft. He exhibits mass (pulsatile). There is no tenderness.  Genitourinary:  Foley catheter in place  Musculoskeletal: He exhibits edema (2+ on left, 1+ on right up to mid calf).  Lymphadenopathy:    He has no cervical adenopathy.  Neurological: He is alert and oriented to person, place, and time. No cranial nerve deficit.  Skin: Skin is warm and dry.  Psychiatric: He has a normal mood and affect.   Echocardiogram 10/18/12 Study Conclusions  - Left ventricle: The cavity size was severely dilated. Wall thickness was normal. Systolic function was moderately to severely reduced. The estimated ejection fraction was in the range of 30% to 35%. Diffuse hypokinesis. Moderate hypokinesis of the mid-distalanteroseptal myocardium. Features are consistent with a pseudonormal left ventricular filling pattern, with concomitant abnormal relaxation and increased filling pressure (grade 2 diastolic dysfunction). - Aortic valve: There was moderate to severe stenosis. Valve area: 0.62cm^2(VTI). Valve area: 0.63cm^2 (  Vmax). - Mitral valve: Mild regurgitation. - Right ventricle: The cavity size was mildly dilated. - Pulmonary arteries: PA peak pressure: 58mm Hg (S). - Pericardium, extracardiac: A small, free-flowing pericardial effusion was identified circumferential to the heart. The fluid had no internal echoes.There was no evidence of  hemodynamic compromise. Transthoracic echocardiography. M-mode, complete 2D, spectral Doppler, and color Doppler. Height: Height: 167.6cm. Height: 66in. Weight: Weight: 91.7kg. Weight: 201.7lb. Body mass index: BMI: 32.6kg/m^2. Body surface area: BSA: 2.83m^2. Blood pressure: 97/59. Patient status: Inpatient. Location: Echo laboratory.     Echocardiogram 10/21/12 Study Conclusions  - Left ventricle: Systolic function was moderately reduced. The estimated ejection fraction was in the range of 35% to 40%. Akinesis of the mid-distalanteroseptal myocardium. - Aortic valve: AV is thickened, calcified with restricted motion Peak and mean gradients through the valve are 50 and 33 mm Hg respectively consistent with modrate AS. COmpared to echo from 10/15/12, mean gradient is increased. There was moderate stenosis. Trivial regurgitation. Valve area: 0.91cm^2(VTI). Valve area: 0.85cm^2 (Vmax). - Mitral valve: Calcified annulus. Mildly thickened leaflets . Transthoracic echocardiography. M-mode, limited 2D, spectral Doppler, and color Doppler. Height: Height: 167.6cm. Height: 66in. Weight: Weight: 89.4kg. Weight: 196.7lb. Body mass index: BMI: 31.8kg/m^2. Body surface area: BSA: 2.27m^2. Blood pressure: 99/65. Patient status: Inpatient. Location: Echo laboratory.     Cardiac Catheterization 10/22/12  Procedural Findings:  Hemodynamics (mmHg)  RA mean 9  RV 48/11  PA 48/19, mean 32  PCWP mean 22  AO 101/67  Oxygen saturations:  PA 68%  AO 98%  Cardiac Output (Fick) 6.45 L/min  Cardiac Index (Fick) 3.26  Coronary angiography:  Coronary dominance: right  Left mainstem: 30-40% distal tapering.  Left anterior descending (LAD): 95% proximal LAD after 1st septal.  Left circumflex (LCx): 70-80% ostial stenosis in a large ramus. 70% mid LCx stenosis.  Right coronary artery (RCA): 90% irregular proximal RCA stenosis. Moderate luminals up to 40% throughout the RCA.  Left  ventriculography: Not done, at least moderate AS by echo. EF around 35% by echo.  Final Conclusions: Severe 3 vessel disease with moderate systolic dysfunction and moderate to severe aortic stenosis (moderate by mean gradient, severe by calculated valve area - possible low gradient severe AS with low EF). Ideally, revascularization would be done by CABG with bioprosthetic aortic valve replacement. However, he has multiple comorbidities. He had a long hospitalization for ARDS/PNA in 1/14 with trach, now recovered. He lives in a nursing home now but has been walking with a walker. He prostate cancer that is being treated with indwelling foley. 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 I will involve CVTS and vascular surgery before making decision on how to proceed. Could intervene percutaneously but would likely not be a full revascularization. Mildly elevated right and left heart filling pressures. Continue IV Lasix.  Loralie Champagne  10/22/2012, 1:42 PM     Assessment/Plan: 72 yo male with multiple severe medical problems. He was admitted with class IV CHF- acute sytolic and diastolic. He has been found to have severe 3 vessel CAD and at least moderate, if not severe, AS. He has impaired left ventricular function with an EF of 35-40%. His AS  Was severe by calculated valve area, but only moderate by gradients. It is likely that gradient is underestimating the severity of the AS due to his weak LV. In any event the aortic valve would need to be addressed at the time of CABG.  He also has  another life threatening problem with large abdominal aortic and iliac aneurysms. Unfortunately those are not amenable to stent grafting, which would have simplified matters greatly.  He is in a generally deconditioned state from his recent prolonged hospitalization for [id number] complicated by ARDS and respiratory failure. He certainly would benefit from additional recovery time,  but any significant recovery would require a prohibitive delay in treating his aneurysms.  I have asked Pulmonary to see him regarding his COPD and after effects of ARDS to assess his fitness for surgery from a pulmonary stand point  I think the best option is to go ahead with AVR, CABG during this admission and plan for repair of his aneurysms after an interval for recovery. He is an extremely high risk patient and he will have a difficult time recovering from 2 major procedures back to back. He will be at increased risk of aneurysm rupture in the postoperative period.  I had a long and detailed discussion with the patient and his family about AVR CABG including the general nature of the procedure, need for general anesthesia, and incisions to be used. We discussed use of a tissue valve to avoid the need for anticoagulation in the setting of his aneurysms. I have discussed the expected hospital stay, overall recovery and short and long term outcomes. They understand the risks include, but are not limited to death, stroke, MI, DVT/PE, bleeding, possible need for transfusion, infections, heart block, arrhythmias, and other organ system dysfunction including respiratory, renal, or GI complications. He is at especially high risk for respiratory failure.   Will wait for Pulmonary to weigh in on his status before making a final decision.  Taryn Nave C 10/23/2012, 4:15 PM

## 2012-10-25 NOTE — Progress Notes (Signed)
Patient ID: [name], male   DOB: 14-Sep-1940, 72 y.o.   MRN: [id number]  SICU Evening Rounds:  Remains on dop 3, neo and low dose levophed to support BP  Had bronch postop for mucous plugging  and left lung collapse. Plan to keep on vent overnight.  Urine output ok. CT output low.  CBC    Component Value Date/Time   WBC 7.3 10/25/2012 2050   RBC 3.40* 10/25/2012 2050   HGB 9.2* 10/25/2012 2057   HCT 27.0* 10/25/2012 2057   PLT 86* 10/25/2012 2050   MCV 84.4 10/25/2012 2050   MCH 27.6 10/25/2012 2050   MCHC 32.8 10/25/2012 2050   RDW 19.8* 10/25/2012 2050   LYMPHSABS 0.5* 07/24/2012 0427   MONOABS 0.5 07/24/2012 0427   EOSABS 0.0 07/24/2012 0427   BASOSABS 0.0 07/24/2012 0427    BMET    Component Value Date/Time   NA 139 10/25/2012 2057   K 5.0 10/25/2012 2057   CL 104 10/25/2012 2057   CO2 31 10/25/2012 0500   GLUCOSE 173* 10/25/2012 2057   BUN 17 10/25/2012 2057   CREATININE 0.70 10/25/2012 2057   CALCIUM 9.6 10/25/2012 0500   GFRNONAA >90 10/25/2012 2050   GFRAA >90 10/25/2012 2050

## 2012-10-25 NOTE — Brief Op Note (Addendum)
10/17/2012 - 10/25/2012  12:37 PM  PATIENT:  Rodman Key  72 y.o. male  PRE-OPERATIVE DIAGNOSIS:  Coronary artery disease, Aortic Stenosis  POST-OPERATIVE DIAGNOSIS:  Coronary artery disease, Aortic Stenosis  PROCEDURE:    CORONARY ARTERY BYPASS GRAFTING x 4 (LIMA-LAD, SVG-Ramus-OM1, SVG-PD)  ENDOSCOPIC VEIN HARVEST BILATERAL THIGHS  AORTIC VALVE REPLACEMENT (21 mm Magna Ease pericardial tissue valve)   SURGEON:   Melrose Nakayama, MD  ASSISTANT: Suzzanne Cloud, PA-C  ANESTHESIA:   general  PATIENT CONDITION:  ICU - intubated and hemodynamically stable.  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

## 2012-10-25 NOTE — Progress Notes (Signed)
Initial postop labs showed a respiratory acidosis with a pCO2 of 60 and marginal oxygenation with a pO2 of 57  CXR showed complete left lung atelectasis, ETT in good position  Needs bronchoscopy to reexpand left lung  Bronchoscopy performed at bedside via ETT and mucous plug removed from LMSB

## 2012-10-25 NOTE — Progress Notes (Signed)
Decreased fio2 to 80% per ABG

## 2012-10-25 NOTE — Progress Notes (Signed)
PULMONARY  / CRITICAL CARE MEDICINE  Name: [name] MRN: [id number] DOB: [birth year]    ADMISSION DATE:  10/17/2012 CONSULTATION DATE:  10/23/12  REFERRING MD :  Dr.  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  PCCM asked to evaluate for pre-op pulmonary clearance.    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 4/21 - ECHO>>EF 35-40%, moderate AS 4/22 - Cardiac Cath >>Severe 3 vessel disease (LAD 95%, LCx 70-80%, RCA 90%)  with moderate systolic dysfunction and moderate to severe aortic stenosis  4/23 - PFT with moderate mixed obstructive/restrictive defect with severely reduced DLCO.   4/23 - Pre-CABG Doppler >>R ICA without hemodynamically significant stenosis.  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 4/25 CABG/AVR 4/25 FOB for total collapse of L lung with removal of mucus plug  LINES / TUBES: ETT 4/25 >>   CULTURES: 4/17 MRSA PCR>>>neg  ANTIBIOTICS:    SUBJECTIVE: RASS - 4, minimally responsive, intubated  VITAL SIGNS: Temp:  [96.6 F (35.9 C)-98.8 F (37.1 C)] 98.1 F (36.7 C) (04/25 1830) Pulse Rate:  [86-109] 103 (04/25 1830) Resp:  [12-24] 24 (04/25 1830) BP: (68-106)/(46-84) 106/84 mmHg (04/25 1830) SpO2:  [89 %-100 %] 99 % (04/25 1830) Arterial Line BP: (66-118)/(41-73) 105/71 mmHg (04/25 1830) FiO2 (%):  [79.8 %-100 %] 80 % (04/25 1830) Weight:  [90.13 kg (198 lb 11.2 oz)] 90.13 kg (198 lb 11.2 oz) (04/25 1510)  PHYSICAL EXAMINATION: General: RASS -4, minimally responsive Neuro: CNs intact, MAEs HEENT: WNL Cardiovascular: RRR with syst M Lungs: diminished on L, no wheeze Abdomen:  Obese/soft Ext: symmetric ankle edema    Recent  Labs Lab 10/24/12 0831 10/24/12 1850 10/25/12 0500  10/25/12 1328 10/25/12 1404 10/25/12 1519  NA 142 139 139  < > 138 139 139  K 3.8 4.2 4.4  < > 4.5 3.6 3.6  CL 102 100 101  --   --   --   --   CO2 30 33* 31  --   --   --   --   BUN 25* 25* 24*  --   --   --   --   CREATININE 0.83 0.90 0.88  --   --   --   --   GLUCOSE 203* 140* 104*  < > 147* 124* 93  < > = values in this interval not displayed.  Recent Labs Lab 10/24/12 0450 10/25/12 0500  10/25/12 1230  10/25/12 1404 10/25/12 1519 10/25/12 1600  HGB 9.7* 10.2*  < > 7.9*  < > 7.8* 9.5* 9.3*  HCT 32.0* 33.7*  < > 24.5*  < > 23.0* 28.0* 29.0*  WBC 4.9 5.1  --   --   --   --   --  7.7  PLT 190 182  --  108*  --   --   --  90*  < > = values in this interval not displayed.   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 Wean in AM 4/26 Bronchodilators ordered Rest of mgmt per TCTS   Merton Border, MD ; [facility] [phone].  After 5:30 PM or weekends, call [phone]

## 2012-10-25 NOTE — Anesthesia Procedure Notes (Signed)
Procedure Name: Intubation Date/Time: 10/25/2012 7:53 AM Performed by: Jacquiline Doe A Pre-anesthesia Checklist: Patient identified, Timeout performed, Emergency Drugs available, Suction available and Patient being monitored Patient Re-evaluated:Patient Re-evaluated prior to inductionOxygen Delivery Method: Circle system utilized Preoxygenation: Pre-oxygenation with 100% oxygen Intubation Type: IV induction and Cricoid Pressure applied Ventilation: Mask ventilation without difficulty Laryngoscope Size: Mac Grade View: Grade I Tube type: Subglottic suction tube Tube size: 7.5 mm Number of attempts: 1 Airway Equipment and Method: Stylet and Video-laryngoscopy Placement Confirmation: ETT inserted through vocal cords under direct vision,  breath sounds checked- equal and bilateral and positive ETCO2 Secured at: 23 cm Tube secured with: Tape Dental Injury: Teeth and Oropharynx as per pre-operative assessment

## 2012-10-25 NOTE — Procedures (Signed)
Patient still sedated from surgery- on precedex  Flexible fiberoptic bronchoscopy performed at bedside for left lung atelectasis  Mucous plug at origin of left main stem bronchus occluding it.  Mucous plug evacuated, saline lavage of upper and lower lobe bronchi

## 2012-10-25 NOTE — Transfer of Care (Incomplete)
Immediate Anesthesia Transfer of Care Note  Patient: [name]  Procedure(s) Performed: Procedure(s) with comments: CORONARY ARTERY BYPASS GRAFTING (CABG) (N/A) - times 4 using left internal mammary artery and endoscopically harvested bilateral saphenous vein  AORTIC VALVE REPLACEMENT (AVR) (N/A) INTRAOPERATIVE TRANSESOPHAGEAL ECHOCARDIOGRAM (N/A)  Patient Location: {PLACES; ANE POST:19477::"PACU"}  Anesthesia Type:{PROCEDURES; ANE POST ANESTHESIA TYPE:19480}  Level of Consciousness: {FINDINGS; ANE POST LEVEL OF CONSCIOUSNESS:19484}  Airway & Oxygen Therapy: {Exam; oxygen device:30095}  Post-op Assessment: {ASSESSMENT;POST-OP MU:3154226  Post vital signs: {DESC; ANE POST 123XX123  Complications: {FINDINGS; ANE POST COMPLICATIONS:19485}

## 2012-10-25 NOTE — Progress Notes (Signed)
  Echocardiogram Echocardiogram Transesophageal has been performed.  Mauricio Po 10/25/2012, 9:51 AM

## 2012-10-25 NOTE — Progress Notes (Signed)
New vent orders from Dr Conception Chancy

## 2012-10-25 NOTE — Progress Notes (Signed)
Decreased FIO2 to 80% per SPO2

## 2012-10-25 NOTE — Interval H&P Note (Signed)
History and Physical Interval Note:  10/25/2012 7:38 AM  [name]  has presented today for surgery, with the diagnosis of CAD AS  The various methods of treatment have been discussed with the patient and family. After consideration of risks, benefits and other options for treatment, the patient has consented to  Procedure(s): CORONARY ARTERY BYPASS GRAFTING (CABG) (N/A) AORTIC VALVE REPLACEMENT (AVR) (N/A) INTRAOPERATIVE TRANSESOPHAGEAL ECHOCARDIOGRAM (N/A) as a surgical intervention .  The patient's history has been reviewed, patient examined, no change in status, stable for surgery.  I have reviewed the patient's chart and labs.  Questions were answered to the patient's satisfaction.     Perian Tedder C

## 2012-10-25 NOTE — Preoperative (Signed)
Beta Blockers   Reason not to administer Beta Blockers:Metoprolol 12.5 mg po @0530  on 04/45/2012

## 2012-10-25 NOTE — Transfer of Care (Signed)
Immediate Anesthesia Transfer of Care Note  Patient: [name]  Procedure(s) Performed: Procedure(s) with comments: CORONARY ARTERY BYPASS GRAFTING (CABG) (N/A) - times 4 using left internal mammary artery and endoscopically harvested bilateral saphenous vein  AORTIC VALVE REPLACEMENT (AVR) (N/A) INTRAOPERATIVE TRANSESOPHAGEAL ECHOCARDIOGRAM (N/A)  Patient Location: SICU  Anesthesia Type:General  Level of Consciousness: Patient remains intubated per anesthesia plan  Airway & Oxygen Therapy: Patient remains intubated per anesthesia plan and Patient placed on Ventilator (see vital sign flow sheet for setting)  Post-op Assessment: Report given to SICU  RN and Post -op Vital signs reviewed and stable  Post vital signs: Reviewed and stable  Complications: No apparent anesthesia complications

## 2012-10-25 NOTE — Progress Notes (Signed)
NUTRITION FOLLOW UP  Intervention:   No nutrition interventions at this time  Nutrition Dx:   Unintentional weight loss related to recent illness as evidenced by weight trends.   Goal:   PO intake to meet >/=90% estimated nutrition needs. Met  Monitor:   PO intake, weight trends  Assessment:   Pt weight has trended down, now 198 lbs, likely fluid related. Pt is eating 100% of all meals.  Had a cardiac cath on 4/22, with CAD and aortic stenosis and AAA.  Currently for CABG, RD was not able to talk with pt.   Height: Ht Readings from Last 1 Encounters:  10/17/12 5\' 6"  (1.676 m)    Weight Status:   Wt Readings from Last 1 Encounters:  10/25/12 198 lb 11.2 oz (90.13 kg)    Re-estimated needs:  Kcal: 1800-2000  Protein: 80-90 gm  Fluid: 1.5 L  Skin: intact   Diet Order: NPO   Intake/Output Summary (Last 24 hours) at 10/25/12 1222 Last data filed at 10/25/12 1026  Gross per 24 hour  Intake   2693 ml  Output   2700 ml  Net     -7 ml     Labs:   Recent Labs Lab 10/24/12 0831 10/24/12 1850 10/25/12 0500  NA 142 139 139  K 3.8 4.2 4.4  CL 102 100 101  CO2 30 33* 31  BUN 25* 25* 24*  CREATININE 0.83 0.90 0.88  CALCIUM 9.4 9.5 9.6  GLUCOSE 203* 140* 104*    CBG (last 3)   Recent Labs  10/24/12 1630 10/24/12 2150 10/25/12 0553  GLUCAP 113* 147* 93    Scheduled Meds: . [facility] HOLD] albuterol  2.5 mg Nebulization TID  . [facility] HOLD] aspirin  81 mg Oral Daily  . [facility] HOLD] atorvastatin  80 mg Oral Daily  . [facility] HOLD] carvedilol  3.125 mg Oral BID WC  . [facility] HOLD] docusate sodium  100 mg Oral BID  . DOPamine  2-20 mcg/kg/min Intravenous To OR  . [facility] HOLD] dutasteride  0.5 mg Oral QPM  . epinephrine  0.5-20 mcg/min Intravenous To OR  . [facility] HOLD] furosemide  40 mg Intravenous Q12H  . [[facility] HOLD] heparin  5,000 Units Subcutaneous Q8H  . [[facility] HOLD] insulin aspart  0-15 Units Subcutaneous TID WC  . [[facility] HOLD] ipratropium  0.5 mg Nebulization TID  . [facility]  HOLD] lisinopril  2.5 mg Oral Daily  . magnesium sulfate  40 mEq Other To OR  . phenylephrine (NEO-SYNEPHRINE) Adult infusion  30-200 mcg/min Intravenous To OR  . potassium chloride  80 mEq Other To OR  . [[facility] HOLD] senna-docusate  1 tablet Oral BID  . [facility] HOLD] sertraline  50 mg Oral QPM  . [[facility] HOLD] sodium chloride  3 mL Intravenous Q12H  . [facility] HOLD] spironolactone  25 mg Oral Daily  . [facility] HOLD] tamsulosin  0.4 mg Oral Q12H  . [facility] HOLD] traZODone  25 mg Oral QHS    Continuous Infusions: . sodium chloride      Orson Slick RD, LDN Pager [phone] After Hours pager [phone]

## 2012-10-25 NOTE — Anesthesia Preprocedure Evaluation (Addendum)
Anesthesia Evaluation  Patient identified by MRN, date of birth, ID band Patient awake    Reviewed: Allergy & Precautions, H&P , NPO status , Patient's Chart, lab work & pertinent test results, reviewed documented beta blocker date and time   Airway Mallampati: II TM Distance: >3 FB Neck ROM: full    Dental   Pulmonary shortness of breath and at rest, sleep apnea and Continuous Positive Airway Pressure Ventilation , pneumonia -, resolved, COPD COPD inhaler, Current Smoker,  breath sounds clear to auscultation        Cardiovascular hypertension, On Medications and On Home Beta Blockers + CAD, + Peripheral Vascular Disease and +CHF Rhythm:regular     Neuro/Psych negative neurological ROS  negative psych ROS   GI/Hepatic negative GI ROS, Neg liver ROS,   Endo/Other  diabetes, Insulin Dependent  Renal/GU negative Renal ROS  negative genitourinary   Musculoskeletal   Abdominal   Peds  Hematology negative hematology ROS (+)   Anesthesia Other Findings See surgeon's H&P   Reproductive/Obstetrics negative OB ROS                           Anesthesia Physical Anesthesia Plan  ASA: IV  Anesthesia Plan: General   Post-op Pain Management:    Induction: Intravenous  Airway Management Planned: Oral ETT  Additional Equipment: Arterial line, CVP, PA Cath, TEE and Ultrasound Guidance Line Placement  Intra-op Plan:   Post-operative Plan: Post-operative intubation/ventilation  Informed Consent: I have reviewed the patients History and Physical, chart, labs and discussed the procedure including the risks, benefits and alternatives for the proposed anesthesia with the patient or authorized representative who has indicated his/her understanding and acceptance.   Dental Advisory Given  Plan Discussed with: CRNA and Surgeon  Anesthesia Plan Comments:        Anesthesia Quick Evaluation

## 2012-10-25 NOTE — Progress Notes (Signed)
Called to assist with Dr. Roxan Hockey with bronchoscopy. Placed pt on 100% Fi02 prior to procedure. 91mls's of normal saline instilled into endotracheal tube. Pt did have one episode of desaturation to 80%. Pt recovered to an 02 sat of 100%. No other complications noted. RT will continue to monitor pt progress.

## 2012-10-25 NOTE — Anesthesia Postprocedure Evaluation (Signed)
  Anesthesia Post-op Note  Patient: [name]  Procedure(s) Performed: Procedure(s) with comments: CORONARY ARTERY BYPASS GRAFTING (CABG) (N/A) - times 4 using left internal mammary artery and endoscopically harvested bilateral saphenous vein  AORTIC VALVE REPLACEMENT (AVR) (N/A) INTRAOPERATIVE TRANSESOPHAGEAL ECHOCARDIOGRAM (N/A)  Patient Location: SICU  Anesthesia Type:General  Level of Consciousness: sedated  Airway and Oxygen Therapy: Patient remains intubated per anesthesia plan and Patient placed on Ventilator (see vital sign flow sheet for setting)  Post-op Pain: none  Post-op Assessment: Post-op Vital signs reviewed and Patient's Cardiovascular Status Stable  Post-op Vital Signs: stable  Complications: No apparent anesthesia complications

## 2012-10-26 ENCOUNTER — Inpatient Hospital Stay (HOSPITAL_COMMUNITY): Payer: BC Managed Care – PPO

## 2012-10-26 LAB — MAGNESIUM: Magnesium: 2.3 mg/dL (ref 1.5–2.5)

## 2012-10-26 LAB — BASIC METABOLIC PANEL
BUN: 18 mg/dL (ref 6–23)
CO2: 24 mEq/L (ref 19–32)
Chloride: 107 mEq/L (ref 96–112)
Creatinine, Ser: 0.76 mg/dL (ref 0.50–1.35)
Glucose, Bld: 160 mg/dL — ABNORMAL HIGH (ref 70–99)

## 2012-10-26 LAB — POCT I-STAT, CHEM 8
BUN: 18 mg/dL (ref 6–23)
Calcium, Ion: 1.32 mmol/L — ABNORMAL HIGH (ref 1.13–1.30)
TCO2: 24 mmol/L (ref 0–100)

## 2012-10-26 LAB — CBC
HCT: 28.1 % — ABNORMAL LOW (ref 39.0–52.0)
Hemoglobin: 9.1 g/dL — ABNORMAL LOW (ref 13.0–17.0)
Hemoglobin: 8 g/dL — ABNORMAL LOW (ref 13.0–17.0)
MCV: 84.9 fL (ref 78.0–100.0)
RBC: 3.31 MIL/uL — ABNORMAL LOW (ref 4.22–5.81)
RBC: 2.93 MIL/uL — ABNORMAL LOW (ref 4.22–5.81)
RDW: 19.6 % — ABNORMAL HIGH (ref 11.5–15.5)
WBC: 8.4 10*3/uL (ref 4.0–10.5)

## 2012-10-26 LAB — GLUCOSE, CAPILLARY
Glucose-Capillary: 150 mg/dL — ABNORMAL HIGH (ref 70–99)
Glucose-Capillary: 166 mg/dL — ABNORMAL HIGH (ref 70–99)
Glucose-Capillary: 158 mg/dL — ABNORMAL HIGH (ref 70–99)

## 2012-10-26 LAB — CREATININE, SERUM
Creatinine, Ser: 0.79 mg/dL (ref 0.50–1.35)
GFR calc non Af Amer: 89 mL/min — ABNORMAL LOW (ref >90)

## 2012-10-26 LAB — POCT I-STAT 3, ART BLOOD GAS (G3+)
Acid-base deficit: 2 mmol/L (ref 0.0–2.0)
O2 Saturation: 93 %
Patient temperature: 38.1

## 2012-10-26 MED ORDER — FUROSEMIDE 10 MG/ML IJ SOLN
40.0000 mg | Freq: Once | INTRAMUSCULAR | Status: AC
  Administered 2012-10-27: 40 mg via INTRAVENOUS

## 2012-10-26 MED ORDER — DEXMEDETOMIDINE HCL IN NACL 400 MCG/100ML IV SOLN
0.1000 ug/kg/h | INTRAVENOUS | Status: DC
  Administered 2012-10-26: 0.5 ug/kg/h via INTRAVENOUS
  Administered 2012-10-26 (×2): 0.7 ug/kg/h via INTRAVENOUS
  Administered 2012-10-26 – 2012-10-28 (×9): 1.2 ug/kg/h via INTRAVENOUS
  Filled 2012-10-26 (×13): qty 100

## 2012-10-26 MED ORDER — FENTANYL CITRATE 0.05 MG/ML IJ SOLN
25.0000 ug | INTRAMUSCULAR | Status: DC | PRN
  Administered 2012-10-27 – 2012-10-28 (×5): 50 ug via INTRAVENOUS
  Filled 2012-10-26 (×3): qty 2

## 2012-10-26 NOTE — Progress Notes (Signed)
1 Day Post-Op Procedure(s) (LRB): CORONARY ARTERY BYPASS GRAFTING (CABG) (N/A) AORTIC VALVE REPLACEMENT (AVR) (N/A) INTRAOPERATIVE TRANSESOPHAGEAL ECHOCARDIOGRAM (N/A) Subjective: Intubated but awake and follows commands.  Objective: Vital signs in last 24 hours: Temp:  [96.6 F (35.9 C)-100.9 F (38.3 C)] 100.4 F (38 C) (04/26 1210) Pulse Rate:  [88-119] 95 (04/26 1030) Cardiac Rhythm:  [-] Normal sinus rhythm (04/26 1000) Resp:  [12-31] 28 (04/26 1030) BP: (68-106)/(44-84) 82/57 mmHg (04/26 1000) SpO2:  [89 %-100 %] 98 % (04/26 1209) Arterial Line BP: (66-122)/(41-73) 89/52 mmHg (04/26 1030) FiO2 (%):  [39.8 %-100 %] 40 % (04/26 1209) Weight:  [90.13 kg (198 lb 11.2 oz)-101.2 kg (223 lb 1.7 oz)] 101.2 kg (223 lb 1.7 oz) (04/26 0500)  Hemodynamic parameters for last 24 hours: PAP: (32-47)/(17-29) 42/22 mmHg CO:  [4.2 L/min-7 L/min] 6 L/min CI:  [2.1 L/min/m2-3.5 L/min/m2] 3 L/min/m2  Intake/Output from previous day: 04/25 0701 - 04/26 0700 In: 10346.4 [I.V.:7932.4; Blood:474; NG/GT:90; IV [id number] Out: [id number] [Urine:4450; Emesis/NG output:200; Blood:2117; Chest Tube:675] Intake/Output this shift: Total I/O In: 315.8 [I.V.:285.8; NG/GT:30] Out: 255 [Urine:255]  General appearance: cooperative Neurologic: intact Heart: regular rate and rhythm Lungs: coarse breath sounds  bilat. Wound: dressing dry. small air leak from chest tube.  Lab Results:  Recent Labs  10/25/12 2050 10/25/12 2057 10/26/12 0400  WBC 7.3  --  8.4  HGB 9.4* 9.2* 9.1*  HCT 28.7* 27.0* 28.1*  PLT 86*  --  94*   BMET:  Recent Labs  10/25/12 0500  10/25/12 2057 10/26/12 0400  NA 139  < > 139 139  Pierce 4.4  < > 5.0 4.8  CL 101  --  104 107  CO2 31  --   --  24  GLUCOSE 104*  < > 173* 160*  BUN 24*  --  17 18  CREATININE 0.88  < > 0.70 0.76  CALCIUM 9.6  --   --  8.6  < > = values in this interval not displayed.  PT/INR:  Recent Labs  10/25/12 1600  LABPROT 17.5*  INR 1.48    ABG    Component Value Date/Time   PHART 7.383 10/26/2012 0508   HCO3 23.0 10/26/2012 0508   TCO2 24 10/26/2012 0508   ACIDBASEDEF 2.0 10/26/2012 0508   O2SAT 93.0 10/26/2012 0508   CBG (last 3)   Recent Labs  10/26/12 0407 10/26/12 0810 10/26/12 1212  GLUCAP 158* 129* 131*    Assessment/Plan: S/P Procedure(s) (LRB): CORONARY ARTERY BYPASS GRAFTING (CABG) (N/A) AORTIC VALVE REPLACEMENT (AVR) (N/A) INTRAOPERATIVE TRANSESOPHAGEAL ECHOCARDIOGRAM (N/A)  He is still requiring some neo for vasodilitation.  Wait on diuresis until off neo. CCM following for vent wean and Dr. Lamonte Sakai does not feel he is ready to wean today. Hx of H1N1 flu with VDRF a few months ago, COPD.    LOS: 9 days    [name],[name] 10/26/2012

## 2012-10-26 NOTE — Progress Notes (Signed)
Patient ID: [name], male   DOB: May 01, 1941, 72 y.o.   MRN: [id number]  SICU Evening Rounds:  Hemodynamically stable. Neo down to 39mcg.  Hgb is 8.0 this pm, down from 9.1 this am. I will give him a unit of prbc's which should allow [place] to get him off neo and start diuresis. No visible blood loss.  He remains calm on vent sedated on Precedex.

## 2012-10-26 NOTE — Progress Notes (Signed)
PULMONARY  / CRITICAL CARE MEDICINE  Name: [name] MRN: [id number] DOB: 1940/08/25    ADMISSION DATE:  10/17/2012 CONSULTATION DATE:  10/23/12  REFERRING MD :  Dr.  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  PCCM asked to evaluate for pre-op pulmonary clearance.    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 4/21 - ECHO>>EF 35-40%, moderate AS 4/22 - Cardiac Cath >>Severe 3 vessel disease (LAD 95%, LCx 70-80%, RCA 90%)  with moderate systolic dysfunction and moderate to severe aortic stenosis  4/23 - PFT with moderate mixed obstructive/restrictive defect with severely reduced DLCO.   4/23 - Pre-CABG Doppler >>R ICA without hemodynamically significant stenosis.  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 4/25 CABG/AVR 4/25 FOB for total collapse of L lung with removal of mucus plug  LINES / TUBES: ETT 4/25 >>   CULTURES: 4/17 MRSA PCR>>>neg  ANTIBIOTICS: vanco 4/25 (post-op)   SUBJECTIVE: RASS - 1, following commands  VITAL SIGNS: Temp:  [96.6 F (35.9 C)-100.9 F (38.3 C)] 100 F (37.8 C) (04/26 1030) Pulse Rate:  [88-119] 95 (04/26 1030) Resp:  [12-31] 28 (04/26 1030) BP: (68-106)/(44-84) 82/57 mmHg (04/26 1000) SpO2:  [89 %-100 %] 95 % (04/26 1030) Arterial Line BP: (66-122)/(41-73) 89/52 mmHg (04/26 1030) FiO2 (%):  [39.8 %-100 %] 40 % (04/26 1030) Weight:  [90.13 kg (198 lb 11.2 oz)-101.2 kg (223 lb 1.7 oz)] 101.2 kg (223 lb 1.7 oz) (04/26 0500)  PHYSICAL EXAMINATION: General: RASS -1, intubated Neuro: CNs intact, MAEs HEENT: WNL Cardiovascular: RRR with syst M Lungs: diminished on L, no wheeze Abdomen:  Obese/soft Ext: symmetric ankle  edema    Recent Labs Lab 10/24/12 1850 10/25/12 0500  10/25/12 1519 10/25/12 2050 10/25/12 2057 10/26/12 0400  NA 139 139  < > 139  --  139 139  K 4.2 4.4  < > 3.6  --  5.0 4.8  CL 100 101  --   --   --  104 107  CO2 33* 31  --   --   --   --  24  BUN 25* 24*  --   --   --  17 18  CREATININE 0.90 0.88  --   --  0.68 0.70 0.76  GLUCOSE 140* 104*  < > 93  --  173* 160*  < > = values in this interval not displayed.  Recent Labs Lab 10/25/12 1600 10/25/12 2050 10/25/12 2057 10/26/12 0400  HGB 9.3* 9.4* 9.2* 9.1*  HCT 29.0* 28.7* 27.0* 28.1*  WBC 7.7 7.3  --  8.4  PLT 90* 86*  --  94*    Recent Labs Lab 10/25/12 1520 10/25/12 1621 10/25/12 1733 10/25/12 1918 10/25/12 2057 10/26/12 0508  PHART 7.266* 7.311* 7.274* 7.329*  --  7.383  PCO2ART 57.6* 52.3* 55.8* 48.9*  --  38.9  PO2ART 53.0* 57.0* 107.0* 70.0*  --  74.0*  HCO3 26.5* 26.7* 26.1* 25.8*  --  23.0  TCO2 28 28 28 27 24 24   O2SAT 84.0 88.0 97.0 92.0  --  93.0    CXR: 4/26 some increase ion LLL volume loss compared  with post-FOB film from 4/25  ASSESSMENT / PLAN: COPD OSA 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

## 2012-10-26 NOTE — Op Note (Signed)
NAMEALON, MANZANO               ACCOUNT NO.:  [id number]  MEDICAL RECORD NO.:  [id number]  LOCATION:  2312                         FACILITY:  [facility]  PHYSICIAN:  [name]. [name], M.D.DATE OF BIRTH:  15-Jan-1941  DATE OF PROCEDURE: DATE OF DISCHARGE:                              OPERATIVE REPORT   PREOPERATIVE DIAGNOSIS:  Severe three-vessel disease and moderate-to- severe aortic stenosis.  POSTOPERATIVE DIAGNOSIS:  Severe three-vessel disease and moderate-to- severe aortic stenosis.  PROCEDURE:  Median sternotomy, extracorporeal circulation, coronary artery bypass grafting x4 (left internal mammary artery to left anterior descending, sequential saphenous vein graft to ramus intermedius, and obtuse marginal 1, saphenous vein graft to posterior descending), aortic valve replacement with 21 mm [facility] Ease pericardial valve (model number 3300 TFX, serial number [id number]), endoscopic vein harvest- both thighs.  SURGEON:  [name]. Vincent Hockey, MD  ASSISTANT:  Suzzanne Cloud, PA  ANESTHESIA:  General.  FINDINGS:  Transesophageal echocardiography revealed a dilated, hypertrophied left ventricle with ejection fraction of 35-40%.  There was moderately severe aortic stenosis with calculated valve area of 0.9 cm2.  The valve appeared bicuspid.  Intraop- bicuspid stenotic aortic valve with fusion of left and right cusps, oversized non coronary cusp,  normal coronary ostial takeoffs, moderate calcification of the valve leaflets, no calcification of the anulus, good quality conduits and good quality targets.  Postbypass TEE showed good function of the prosthetic valve with no perivalvular leaks.  No change in left ventricular function.  CLINICAL NOTE:  [name] is a 72 year old gentleman who had recently been acutely ill and hospitalized with H1N1 influenza and subsequently developed pneumonia and required a prolonged ventilatory management.  He had been discharged to a  nursing home for further rehabilitation.  Over the course of 2 weeks, he gained over 20 pounds and presented with congestive heart failure.  Workup revealed severe three-vessel coronary disease and moderately severe aortic stenosis.  There was moderate left ventricular dysfunction.  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  The patient was advised to undergo aortic valve replacement and coronary artery bypass grafting Initially, with planned aneurysm repair after full recovery.  This was an extremely high risk procedure and the patient was aware of the high-risk nature of the procedure and wished to proceed.  OPERATIVE NOTE:  [name] was brought to the preop holding area on October 25, 2012, there Anesthesia placed a Swan-Ganz catheter and arterial blood pressure monitoring line.  He did have moderate pulmonary hypertension.  He was taken to the operating room, anesthetized, and intubated.  A transesophageal echocardiography was performed, it revealed findings as noted above.  There was moderately severe left ventricular dysfunction.  There was moderately severe aortic stenosis. He was given intravenous antibiotics.  The chest, abdomen, and legs were prepped and draped in usual sterile fashion. An incision was made in the medial aspect of the right leg at the level of the knee, the greater saphenous vein was harvested from the right thigh, it was acceptable for use and was a good quality vessel in the upper thigh. In the lower thigh it became small and below the incision at the  level of the knee the vein bifurcated and was not usable.  Therefore, additional vein was harvested from the left thigh endoscopically.  Simultaneously with this, a median sternotomy was performed.  Adhesions in the left pleural space were taken down.  The left internal mammary artery was harvested using Pierce technique.  2000 units of heparin was administered  during the vessel harvest, the remainder of the full heparin dose was given prior to opening the pericardium.  After harvesting the conduit, the pericardium was opened.  There was a moderate pericardial effusion,  this was serous fluid.  After confirming adequate anticoagulation with ACT measurement, the aorta was cannulated via concentric 2-0 Ethibond pledgeted pursestring sutures.  A dual-stage venous cannula was placed via pursestring suture in the right atrial appendage.  Cardiopulmonary bypass was instituted and the patient was cooled to 28 degrees Celsius.  A left ventricular vent was placed via pursestring suture in the right superior pulmonary vein, and a retrograde cardioplegia cannula was placed via pursestring suture in the right atrium and directed into the coronary sinus.  An antegrade cardioplegia cannula was placed in the ascending aorta.  A temperature probe was placed in myocardial septum. Carbon dioxide was insufflated into the operative field.  The aorta was crossclamped.  The left ventricle was emptied via the aortic root and LV vents.  Cardiac arrest then was achieved with combination of cold antegrade and retrograde blood cardioplegia and topical iced saline.  An initial 500 mL of cardioplegia was administered antegrade, there was a rapid diastolic arrest and septal cooling to 20 degrees Celsius, and additional 750 mL of cardioplegia was administered retrograde.  There was septal cooling to 9 degrees Celsius.  The following distal anastomoses were performed.  First, a reversed saphenous vein graft was placed end-to-side to the posterior descending.  This was a 1.5-mm good quality target at the site of the  anastomosis.  There was some plaque distally but a probe passed easily. The vein was anastomosed end-to-side with a running 7-0 Prolene suture. All anastomoses were probed proximally and distally at their completion to ensure patency.  Cardioplegia was  administered at the end of each vein graft to assess flow and hemostasis.  Next, a reversed saphenous vein graft was placed sequentially to the ramus intermedius and first obtuse marginal branch of the left circumflex. These were both 1.5 mm target vessels.  A side-to-side anastomosis was performed at the ramus intermedius and an end-to-side to OM1. Both were done with running 7-0 Prolene sutures.  There was excellent flow through the graft and good hemostasis at both anastomoses.  Next, the pericardium was incised to allow passage of the internal mammary artery.  The distal end of it was beveled. It was a 2-mm good quality conduit. It was anastomosed end-to-side to the distal LAD which was a 2-mm good quality target.  An end-to-side anastomosis was performed with a running 8-0 Prolene suture.  At the completion of the mammary to LAD anastomosis, the bulldog clamps were briefly removed to inspect for hemostasis.  Immediate and rapid septal rewarming was noted.  The bulldog clamp was replaced.  The mammary pedicle was tacked to epicardial surface of the heart with 6-0 Prolene sutures.  Additional cardioplegia was administered via the retrograde cannula and then additional doses were administered at 20 minute intervals during the valve replacement portion of the procedure.  An aortotomy was made just above the sino-tubular junction, it was extended into the noncoronary sinus of Valsalva.  The aortic valve  was inspected.  It was a calcific, stenotic, bicuspid valve with fusion of the left and right cusp and an oversized noncoronary cusp.  Coronary ostia had a normal orientation.  The valve leaflets were excised.  There was no calcium in the anulus.  The anulus sized for 21 mm [facility] Ease pericardial valve.  The anulus was copiously irrigated with iced saline.  While the valve was being prepared per manufacturers recommendations, 2-0 Ethibond horizontal mattress sutures with  subannular pledgets were placed circumferentially around the anulus, a total of 15 sutures were utilized.  These were then passed through the sewing ring of the valve after it had been properly rinsed.  The valve was then lowered into place and sutures were sequentially tied.  The valve seated nicely.  The anulus was probed with a fine tip right-angle.  There were no gaps.  The coronary ostia were inspected.  There was no impingement on them. Rewarming was begun.  The aortotomy was closed in 2 layers with a running 4-0 Prolene sutures.  The first was a running horizontal mattress suture followed by running simple suture.  After the aortotomy had been closed, the cardioplegia cannula was removed from the ascending aorta.  The proximal vein graft anastomoses then were performed to 4.5 mm punch aortotomies with running 6-0 Prolene sutures.  At the completion of the final proximal anastomosis, the patient was placed in Trendelenburg position.  A warm dose of retrograde cardioplegia was administered.  Lidocaine was administered.  After giving the retrograde cardioplegia, the balloon was deflated on the retrograde catheter.  The bulldog clamp was removed from the left mammary artery.  Final de-airing was performed and the aortic crossclamp was removed.  The total crossclamp time was 121 minutes.  The patient initially fibrillated and converted into complete heart block and then resumed sinus rhythm without intervention.  While rewarming was completed, all proximal and distal anastomoses were inspected for hemostasis.  Additional pledgeted 4-0 Prolene sutures were required at the aortotomy for hemostasis.  Epicardial pacing wires were placed on the right ventricle and right atrium.  When the patient had rewarmed to a core temperature of 37 degrees Celsius, he was weaned from cardiopulmonary bypass on the first attempt in sinus rhythm and on low- dose dopamine.  The initial cardiac index was  greater than 2 liters/minute/m sq. and the patient remained hemodynamically stable throughout the postbypass period.  Postbypass transesophageal echocardiography revealed good function of the prosthetic valve with no perivalvular leaks.  There was no change in left ventricular function.  The test dose of protamine was administered and was well tolerated.  The atrial and aortic cannulae were removed.  The remainder of protamine was administered without incident.  The chest was irrigated with warm saline.  Hemostasis was achieved.  The pericardium was not closed.  A Blake drain was placed into the left pleural space superiorly, there were dense adhesions inferiorly and laterally.  A chest tube was placed into the mediastinum.  The sternum was closed with interrupted heavy gauge stainless steel wires.  The patient tolerated this well hemodynamically.  The remainder of the incision was closed in Pierce fashion as were the leg incisions.  The patient was then taken from the operating room to the surgical intensive care unit in good condition.     [name] [name], M.D.     SCH/MEDQ  D:  10/25/2012  T:  10/26/2012  Job:  [id number]

## 2012-10-26 NOTE — Progress Notes (Signed)
Agitation on Precedex   Fentanyl ordered prn

## 2012-10-26 NOTE — Progress Notes (Signed)
Agitation, attempting to self extubate  Soft restrains ordered

## 2012-10-27 ENCOUNTER — Inpatient Hospital Stay (HOSPITAL_COMMUNITY): Payer: BC Managed Care – PPO

## 2012-10-27 LAB — GLUCOSE, CAPILLARY
Glucose-Capillary: 155 mg/dL — ABNORMAL HIGH (ref 70–99)
Glucose-Capillary: 111 mg/dL — ABNORMAL HIGH (ref 70–99)
Glucose-Capillary: 119 mg/dL — ABNORMAL HIGH (ref 70–99)

## 2012-10-27 LAB — CBC
MCH: 27 pg (ref 26.0–34.0)
MCV: 84 fL (ref 78.0–100.0)
Platelets: 65 10*3/uL — ABNORMAL LOW (ref 150–400)
RBC: 3.18 MIL/uL — ABNORMAL LOW (ref 4.22–5.81)

## 2012-10-27 LAB — TYPE AND SCREEN
Antibody Screen: NEGATIVE
Unit division: 0
Unit division: 0

## 2012-10-27 LAB — BASIC METABOLIC PANEL
CO2: 25 mEq/L (ref 19–32)
Calcium: 9 mg/dL (ref 8.4–10.5)
Creatinine, Ser: 0.77 mg/dL (ref 0.50–1.35)
Glucose, Bld: 150 mg/dL — ABNORMAL HIGH (ref 70–99)
Sodium: 138 mEq/L (ref 135–145)

## 2012-10-27 LAB — POTASSIUM: Potassium: 3.3 mEq/L — ABNORMAL LOW (ref 3.5–5.1)

## 2012-10-27 MED ORDER — FUROSEMIDE 10 MG/ML IJ SOLN
80.0000 mg | Freq: Two times a day (BID) | INTRAMUSCULAR | Status: AC
  Administered 2012-10-27 (×2): 80 mg via INTRAVENOUS
  Filled 2012-10-27: qty 4
  Filled 2012-10-27: qty 8

## 2012-10-27 MED ORDER — POTASSIUM CHLORIDE 10 MEQ/50ML IV SOLN
INTRAVENOUS | Status: AC
  Filled 2012-10-27: qty 100

## 2012-10-27 MED ORDER — POTASSIUM CHLORIDE 20 MEQ/15ML (10%) PO LIQD
40.0000 meq | Freq: Once | ORAL | Status: DC
  Filled 2012-10-27: qty 30

## 2012-10-27 MED ORDER — POTASSIUM CHLORIDE 10 MEQ/50ML IV SOLN: 10.0000 meq | INTRAVENOUS | Status: DC

## 2012-10-27 MED ORDER — POTASSIUM CHLORIDE 10 MEQ/50ML IV SOLN
10.0000 meq | INTRAVENOUS | Status: AC
  Administered 2012-10-27 (×4): 10 meq via INTRAVENOUS

## 2012-10-27 MED ORDER — POTASSIUM CHLORIDE 10 MEQ/50ML IV SOLN
10.0000 meq | INTRAVENOUS | Status: AC
  Administered 2012-10-27 – 2012-10-28 (×3): 10 meq via INTRAVENOUS
  Filled 2012-10-27: qty 150

## 2012-10-27 NOTE — Progress Notes (Addendum)
2 Days Post-Op Procedure(s) (LRB): CORONARY ARTERY BYPASS GRAFTING (CABG) (N/A) AORTIC VALVE REPLACEMENT (AVR) (N/A) INTRAOPERATIVE TRANSESOPHAGEAL ECHOCARDIOGRAM (N/A) Subjective: Intubated but alert and cooperative  Objective: Vital signs in last 24 hours: Temp:  [98.6 F (37 C)-100.4 F (38 C)] 98.8 F (37.1 C) (04/27 0900) Pulse Rate:  [91-107] 96 (04/27 0900) Cardiac Rhythm:  [-] Normal sinus rhythm;Bundle branch block (04/27 0800) Resp:  [19-29] 19 (04/27 0900) BP: (79-115)/(53-86) 106/71 mmHg (04/27 0900) SpO2:  [94 %-100 %] 100 % (04/27 0900) Arterial Line BP: (86-129)/(49-79) 129/79 mmHg (04/27 0900) FiO2 (%):  [39.3 %-45.6 %] 40.1 % (04/27 0900) Weight:  [97.9 kg (215 lb 13.3 oz)] 97.9 kg (215 lb 13.3 oz) (04/27 0400)  Hemodynamic parameters for last 24 hours: PAP: (28-45)/(14-30) 41/27 mmHg CO:  [5.7 L/min-6.8 L/min] 6 L/min CI:  [2.9 L/min/m2-3.3 L/min/m2] 3 L/min/m2  Intake/Output from previous day: 04/26 0701 - 04/27 0700 In: 2278.3 [I.V.:1832.4; Blood:295.8; NG/GT:150] Out: 2655 [Urine:2205; Chest Tube:450] Intake/Output this shift: Total I/O In: 171.4 [I.V.:141.4; NG/GT:30] Out: 80 [Urine:80]  General appearance: alert and cooperative Neurologic: intact Heart: regular rate and rhythm, S1, S2 normal, no murmur, click, rub or gallop Lungs: clear to auscultation bilaterally Abdomen: soft, non-tender; bowel sounds normal; no masses,  no organomegaly Extremities: edema mild peripheral edema Wound: incision ok  Lab Results:  Recent Labs  10/26/12 1650 10/26/12 1655 10/27/12 0437  WBC 7.7  --  6.4  HGB 8.0* 7.8* 8.6*  HCT 24.6* 23.0* 26.7*  PLT 84*  --  65*   BMET:  Recent Labs  10/26/12 0400  10/26/12 1655 10/27/12 0437  NA 139  --  140 138  Pierce 4.8  --  4.2 3.8  CL 107  --  104 106  CO2 24  --   --  25  GLUCOSE 160*  --  107* 150*  BUN 18  --  18 19  CREATININE 0.76  < > 0.90 0.77  CALCIUM 8.6  --   --  9.0  < > = values in this interval  not displayed.  PT/INR:  Recent Labs  10/25/12 1600  LABPROT 17.5*  INR 1.48   ABG    Component Value Date/Time   PHART 7.383 10/26/2012 0508   HCO3 23.0 10/26/2012 0508   TCO2 24 10/26/2012 1655   ACIDBASEDEF 2.0 10/26/2012 0508   O2SAT 93.0 10/26/2012 0508   CBG (last 3)   Recent Labs  10/26/12 2348 10/27/12 0419 10/27/12 0827  GLUCAP 117* 155* 158*    Assessment/Plan: S/P Procedure(s) (LRB): CORONARY ARTERY BYPASS GRAFTING (CABG) (N/A) AORTIC VALVE REPLACEMENT (AVR) (N/A) INTRAOPERATIVE TRANSESOPHAGEAL ECHOCARDIOGRAM (N/A) Mobilize Diuresis Continue foley due to patient in ICU and urinary output monitoring wean vent per CCM. I think he will probably do fine extubated. He is alert and cooperative and cxr looks ok Anemia improved after transfusion Thrombocytopenia with dropping platelet count. Not on Lovenox. Will check HIT panel.   LOS: 10 days    [name],[name] 10/27/2012

## 2012-10-27 NOTE — Progress Notes (Signed)
PULMONARY  / CRITICAL CARE MEDICINE  Name: [name] MRN: [id number] DOB: October 12, 1940    ADMISSION DATE:  10/17/2012 CONSULTATION DATE:  10/23/12  REFERRING MD :  Dr.  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  PCCM asked to evaluate for pre-op pulmonary clearance.    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 4/21 - ECHO>>EF 35-40%, moderate AS 4/22 - Cardiac Cath >>Severe 3 vessel disease (LAD 95%, LCx 70-80%, RCA 90%)  with moderate systolic dysfunction and moderate to severe aortic stenosis  4/23 - PFT with moderate mixed obstructive/restrictive defect with severely reduced DLCO.   4/23 - Pre-CABG Doppler >>R ICA without hemodynamically significant stenosis.  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 4/25 CABG/AVR 4/25 FOB for total collapse of L lung with removal of mucus plug  LINES / TUBES: ETT 4/25 >>   CULTURES: 4/17 MRSA PCR>>>neg  ANTIBIOTICS: vanco 4/25 (post-op)   SUBJECTIVE:  Some agitation last night > restraints Pressors off except dopa 2 Received 1u PRBC + lasix last night UOP 20cc/h  VITAL SIGNS: Temp:  [98.6 F (37 C)-100.4 F (38 C)] 98.8 F (37.1 C) (04/27 0900) Pulse Rate:  [91-107] 96 (04/27 0900) Resp:  [19-31] 19 (04/27 0900) BP: (79-115)/(53-86) 106/71 mmHg (04/27 0900) SpO2:  [94 %-100 %] 100 % (04/27 0900) Arterial Line BP: (86-129)/(49-79) 129/79 mmHg (04/27 0900) FiO2 (%):  [39.3 %-45.6 %] 40.1 % (04/27 0900) Weight:  [97.9 kg (215 lb 13.3 oz)] 97.9 kg (215 lb 13.3 oz) (04/27 0400)  PHYSICAL EXAMINATION: General: RASS -1 to 0, intubated Neuro: CNs intact, MAEs HEENT: WNL Cardiovascular: RRR with syst M Lungs:  diminished on L, no wheeze Abdomen:  Obese/soft Ext: symmetric ankle edema    Recent Labs Lab 10/25/12 0500  10/26/12 0400 10/26/12 1650 10/26/12 1655 10/27/12 0437  NA 139  < > 139  --  140 138  K 4.4  < > 4.8  --  4.2 3.8  CL 101  < > 107  --  104 106  CO2 31  --  24  --   --  25  BUN 24*  < > 18  --  18 19  CREATININE 0.88  < > 0.76 0.79 0.90 0.77  GLUCOSE 104*  < > 160*  --  107* 150*  < > = values in this interval not displayed.  Recent Labs Lab 10/26/12 0400 10/26/12 1650 10/26/12 1655 10/27/12 0437  HGB 9.1* 8.0* 7.8* 8.6*  HCT 28.1* 24.6* 23.0* 26.7*  WBC 8.4 7.7  --  6.4  PLT 94* 84*  --  65*    Recent Labs Lab 10/25/12 1520 10/25/12 1621 10/25/12 1733 10/25/12 1918 10/25/12 2057 10/26/12 0508 10/26/12 1655  PHART 7.266* 7.311* 7.274* 7.329*  --  7.383  --   PCO2ART 57.6* 52.3* 55.8* 48.9*  --  38.9  --   PO2ART 53.0* 57.0* 107.0* 70.0*  --  74.0*  --   HCO3 26.5* 26.7* 26.1* 25.8*  --  23.0  --   TCO2 28 28 28 27 24 24 24   O2SAT 84.0 88.0 97.0 92.0  --  93.0  --  CXR: 4/27 no change in LLL volume loss compared with post-FOB film from 4/25, mild interstitial edema  ASSESSMENT / PLAN: COPD OSA Hx of H1N1 / 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

## 2012-10-27 NOTE — Progress Notes (Signed)
Patient ID: [name], male   DOB: 06-25-1941, 72 y.o.   MRN: [id number]  SICU Evening Rounds:  Hemodynamically stable.  Awake and alert on vent. CCM wants to wait until tomorrow to extubate.  Excellent diuresis today.

## 2012-10-27 NOTE — Progress Notes (Signed)
eLink Physician-Brief Progress Note Patient Name: [name] DOB: 1940/08/05 MRN: [id number]  Date of Service  10/27/2012   HPI/Events of Note   hypokalemia  eICU Interventions  repleted potassium    Intervention Category Intermediate Interventions: Electrolyte abnormality - evaluation and management  Harlan Vinal K. 10/27/2012, 9:19 PM

## 2012-10-28 ENCOUNTER — Inpatient Hospital Stay (HOSPITAL_COMMUNITY): Payer: BC Managed Care – PPO

## 2012-10-28 ENCOUNTER — Encounter (HOSPITAL_COMMUNITY): Payer: Self-pay | Admitting: Thoracic Surgery (Cardiothoracic Vascular Surgery)

## 2012-10-28 DIAGNOSIS — I319 Disease of pericardium, unspecified: Secondary | ICD-10-CM

## 2012-10-28 LAB — POCT I-STAT 3, ART BLOOD GAS (G3+)
Acid-base deficit: 7 mmol/L — ABNORMAL HIGH (ref 0.0–2.0)
Bicarbonate: 18.4 mEq/L — ABNORMAL LOW (ref 20.0–24.0)
Patient temperature: 97.6
TCO2: 19 mmol/L (ref 0–100)
pH, Arterial: 7.363 (ref 7.350–7.450)
pO2, Arterial: 66 mmHg — ABNORMAL LOW (ref 80.0–100.0)

## 2012-10-28 LAB — CBC
MCV: 82.7 fL (ref 78.0–100.0)
Platelets: 64 10*3/uL — ABNORMAL LOW (ref 150–400)
RBC: 3.23 MIL/uL — ABNORMAL LOW (ref 4.22–5.81)
RDW: 18.5 % — ABNORMAL HIGH (ref 11.5–15.5)
WBC: 5.4 10*3/uL (ref 4.0–10.5)

## 2012-10-28 LAB — BASIC METABOLIC PANEL
CO2: 25 mEq/L (ref 19–32)
Calcium: 9.2 mg/dL (ref 8.4–10.5)
Chloride: 105 mEq/L (ref 96–112)
Creatinine, Ser: 0.68 mg/dL (ref 0.50–1.35)
GFR calc Af Amer: >90 mL/min (ref >90)
Sodium: 139 mEq/L (ref 135–145)

## 2012-10-28 LAB — GLUCOSE, CAPILLARY
Glucose-Capillary: 131 mg/dL — ABNORMAL HIGH (ref 70–99)
Glucose-Capillary: 187 mg/dL — ABNORMAL HIGH (ref 70–99)

## 2012-10-28 MED ORDER — ALBUMIN HUMAN 5 % IV SOLN
12.5000 g | Freq: Once | INTRAVENOUS | Status: AC
  Administered 2012-10-28: 12.5 g via INTRAVENOUS

## 2012-10-28 MED ORDER — ALBUMIN HUMAN 5 % IV SOLN
INTRAVENOUS | Status: AC
  Administered 2012-10-28: 12.5 g via INTRAVENOUS
  Filled 2012-10-28: qty 250

## 2012-10-28 MED ORDER — ALBUMIN HUMAN 5 % IV SOLN: 12.5000 g | Freq: Once | INTRAVENOUS | Status: AC

## 2012-10-28 MED ORDER — POTASSIUM CHLORIDE 10 MEQ/50ML IV SOLN
10.0000 meq | INTRAVENOUS | Status: AC
  Administered 2012-10-28 (×2): 10 meq via INTRAVENOUS
  Filled 2012-10-28: qty 100

## 2012-10-28 MED ORDER — METHYLPREDNISOLONE SODIUM SUCC 125 MG IJ SOLR
INTRAMUSCULAR | Status: AC
  Filled 2012-10-28: qty 2

## 2012-10-28 MED ORDER — ALBUMIN HUMAN 5 % IV SOLN
INTRAVENOUS | Status: AC
  Administered 2012-10-28: 12.5 g
  Filled 2012-10-28: qty 500

## 2012-10-28 MED ORDER — METHYLPREDNISOLONE SODIUM SUCC 125 MG IJ SOLR
120.0000 mg | Freq: Once | INTRAMUSCULAR | Status: AC
  Filled 2012-10-28: qty 1.92
  Filled 2012-10-28: qty 2

## 2012-10-28 MED ORDER — SODIUM BICARBONATE 8.4 % IV SOLN
50.0000 meq | Freq: Once | INTRAVENOUS | Status: AC
  Administered 2012-10-28: 50 meq via INTRAVENOUS

## 2012-10-28 MED ORDER — POTASSIUM CHLORIDE 10 MEQ/50ML IV SOLN
10.0000 meq | INTRAVENOUS | Status: AC
  Administered 2012-10-28 (×3): 10 meq via INTRAVENOUS
  Filled 2012-10-28: qty 150

## 2012-10-28 MED ORDER — MORPHINE SULFATE 2 MG/ML IJ SOLN: 1.0000 mg | INTRAMUSCULAR | Status: DC | PRN

## 2012-10-28 MED ORDER — SODIUM BICARBONATE 8.4 % IV SOLN
INTRAVENOUS | Status: AC
  Filled 2012-10-28: qty 50

## 2012-10-28 MED ORDER — DOPAMINE-DEXTROSE 3.2-5 MG/ML-% IV SOLN
INTRAVENOUS | Status: AC
  Administered 2012-10-28: 3 ug/kg/min
  Filled 2012-10-28: qty 250

## 2012-10-28 MED FILL — Lidocaine HCl IV Inj 20 MG/ML: INTRAVENOUS | Qty: 5 | Status: AC

## 2012-10-28 MED FILL — Heparin Sodium (Porcine) Inj 1000 Unit/ML: INTRAMUSCULAR | Qty: 30 | Status: AC

## 2012-10-28 MED FILL — Sodium Bicarbonate IV Soln 8.4%: INTRAVENOUS | Qty: 50 | Status: AC

## 2012-10-28 MED FILL — Sodium Chloride IV Soln 0.9%: INTRAVENOUS | Qty: 1000 | Status: AC

## 2012-10-28 MED FILL — Sodium Chloride Irrigation Soln 0.9%: Qty: 3000 | Status: AC

## 2012-10-28 MED FILL — Heparin Sodium (Porcine) Inj 1000 Unit/ML: INTRAMUSCULAR | Qty: 20 | Status: AC

## 2012-10-28 MED FILL — Electrolyte-R (PH 7.4) Solution: INTRAVENOUS | Qty: 4000 | Status: AC

## 2012-10-28 MED FILL — Mannitol IV Soln 20%: INTRAVENOUS | Qty: 500 | Status: AC

## 2012-10-28 NOTE — Progress Notes (Signed)
CTSP for hypotension  He was extubated earlier today without difficulty  After being up in chair he became hypotensive and tachycardic  He was given 2 bottles of albumin and dopamine increased back to 3 mcg/kg/min  He is still hypotensive with a BP of 82/50 and tachycardic with a HR of 115  He denies any pain or difficulty breathing.  BS are a little diminished on the left side Abdomen is soft and nontender He does have some induration of the skin on both flanks  ABG 7.36/32/66/18/-7  Given 1 amp of bicarb and 120 mg of solumedrol  Will check stat CXR and echo to r/o pneumo and pericardial effusion.  Will ask vascular to see although I strongly doubt leak or rupture in the absence of any symptoms

## 2012-10-28 NOTE — Progress Notes (Signed)
PULMONARY  / CRITICAL CARE MEDICINE  Name: [name] MRN: [id number] DOB: 1941-04-20    ADMISSION DATE:  10/17/2012 CONSULTATION DATE:  10/23/12  REFERRING MD :  Dr.  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  PCCM asked to evaluate for pre-op pulmonary clearance.    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 4/21 - ECHO>>EF 35-40%, moderate AS 4/22 - Cardiac Cath >>Severe 3 vessel disease (LAD 95%, LCx 70-80%, RCA 90%)  with moderate systolic dysfunction and moderate to severe aortic stenosis  4/23 - PFT with moderate mixed obstructive/restrictive defect with severely reduced DLCO.   4/23 - Pre-CABG Doppler >>R ICA without hemodynamically significant stenosis.  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 4/25 CABG/AVR 4/25 FOB for total collapse of L lung with removal of mucus plug  LINES / TUBES: ETT 4/25 >>   CULTURES: 4/17 MRSA PCR>>>neg  ANTIBIOTICS: vanco 4/25 (post-op)   SUBJECTIVE:  Tolerating PSv 5 very well,  Following commands  VITAL SIGNS: Temp:  [97.9 F (36.6 C)-99.5 F (37.5 C)] 98.4 F (36.9 C) (04/28 [id number]) Pulse Rate:  [76-106] 82 (04/28 0811) Resp:  [20-28] 23 (04/28 0811) BP: (90-124)/(59-93) 96/65 mmHg (04/28 0811) SpO2:  [81 %-100 %] 100 % (04/28 0811) Arterial Line BP: (88-138)/(56-104) 125/89 mmHg (04/28 0700) FiO2 (%):  [39.6 %-40.4 %] 40 % (04/28 0811) Weight:  [96 kg (211 lb 10.3 oz)] 96 kg (211 lb 10.3 oz) (04/28 0500)  PHYSICAL EXAMINATION: General: RASS -1 to 0, intubated Neuro: CNs intact, MAEs HEENT: WNL Cardiovascular: RRR with syst M Lungs: diminished on L, no wheeze Abdomen:  Obese/soft Ext: symmetric ankle  edema    Recent Labs Lab 10/26/12 0400  10/26/12 1655 10/27/12 0437 10/27/12 2029 10/28/12 0400  NA 139  --  140 138  --  139  K 4.8  --  4.2 3.8 3.3* 3.5  CL 107  --  104 106  --  105  CO2 24  --   --  25  --  25  BUN 18  --  18 19  --  16  CREATININE 0.76  < > 0.90 0.77  --  0.68  GLUCOSE 160*  --  107* 150*  --  139*  < > = values in this interval not displayed.  Recent Labs Lab 10/26/12 1650 10/26/12 1655 10/27/12 0437 10/28/12 0400  HGB 8.0* 7.8* 8.6* 8.9*  HCT 24.6* 23.0* 26.7* 26.7*  WBC 7.7  --  6.4 5.4  PLT 84*  --  65* 64*    Recent Labs Lab 10/25/12 1520 10/25/12 1621 10/25/12 1733 10/25/12 1918 10/25/12 2057 10/26/12 0508 10/26/12 1655  PHART 7.266* 7.311* 7.274* 7.329*  --  7.383  --   PCO2ART 57.6* 52.3* 55.8* 48.9*  --  38.9  --   PO2ART 53.0* 57.0* 107.0* 70.0*  --  74.0*  --   HCO3 26.5* 26.7* 26.1* 25.8*  --  23.0  --   TCO2 28 28 28 27 24 24 24   O2SAT 84.0 88.0 97.0 92.0  --  93.0  --     CXR: 4/28 >  IMPRESSION:  Interval  removal of pulmonary artery catheter with stable  enlargement cardiopericardial silhouette and vascular congestion  with probable interstitial edema   ASSESSMENT / PLAN: COPD OSA Hx of H1N1 / 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 Bronchodilators ordered Will need CPAP qhs  30 minutes CC time  Baltazar Apo, MD, PhD 10/28/2012, 9:01 AM [place] [phone] or if no answer [phone]

## 2012-10-28 NOTE — Progress Notes (Signed)
  Echocardiogram 2D Echocardiogram has been performed.  Ardelle Balls A 10/28/2012, 4:40 PM

## 2012-10-28 NOTE — Progress Notes (Signed)
No complaints at present  BP 93/77  Pulse 122  Temp(Src) 97.8 F (36.6 C) (Oral)  Resp 26  Ht 5\' 6"  (1.676 m)  Wt 211 lb 10.3 oz (96 kg)  BMI 34.18 kg/m2  SpO2 98%  BP currently 130/85, HR 120  Echo showed small effusion, no tamponade, LVEF 35-40% CXR showed no change from morning film  No indication for CT at this time as his BP is improved and has never had symptoms to suggest leak/ rupture

## 2012-10-28 NOTE — Progress Notes (Signed)
UR Completed.  [name] [id number] 10/28/2012

## 2012-10-28 NOTE — Progress Notes (Signed)
3 Days Post-Op Procedure(s) (LRB): CORONARY ARTERY BYPASS GRAFTING (CABG) (N/A) AORTIC VALVE REPLACEMENT (AVR) (N/A) INTRAOPERATIVE TRANSESOPHAGEAL ECHOCARDIOGRAM (N/A) Subjective: Alert and cooperative Denies pain  Objective: Vital signs in last 24 hours: Temp:  [97.9 F (36.6 C)-99.5 F (37.5 C)] 99.5 F (37.5 C) (04/28 0400) Pulse Rate:  [76-106] 97 (04/28 0700) Cardiac Rhythm:  [-] Normal sinus rhythm (04/28 0400) Resp:  [19-28] 24 (04/28 0700) BP: (90-124)/(59-93) 124/93 mmHg (04/28 0700) SpO2:  [81 %-100 %] 100 % (04/28 0700) Arterial Line BP: (88-138)/(56-104) 125/89 mmHg (04/28 0700) FiO2 (%):  [39.6 %-40.4 %] 40.1 % (04/28 0700) Weight:  [211 lb 10.3 oz (96 kg)] 211 lb 10.3 oz (96 kg) (04/28 0500)  Hemodynamic parameters for last 24 hours: PAP: (35-41)/(19-27) 40/27 mmHg CO:  [6 L/min] 6 L/min CI:  [3 L/min/m2] 3 L/min/m2  Intake/Output from previous day: 04/27 0701 - 04/28 0700 In: 1636.1 [I.V.:1226.1; NG/GT:160; IV Piggyback:250] Out: 6410 [id number]; Emesis/NG output:50; Chest Tube:220] Intake/Output this shift:    General appearance: alert and no distress Neurologic: intact Heart: regular rate and rhythm Lungs: clear to auscultation bilaterally Abdomen: normal findings: soft, non-tender Wound: clean and dry  Lab Results:  Recent Labs  10/27/12 0437 10/28/12 0400  WBC 6.4 5.4  HGB 8.6* 8.9*  HCT 26.7* 26.7*  PLT 65* 64*   BMET:  Recent Labs  10/27/12 0437 10/27/12 2029 10/28/12 0400  NA 138  --  139  K 3.8 3.3* 3.5  CL 106  --  105  CO2 25  --  25  GLUCOSE 150*  --  139*  BUN 19  --  16  CREATININE 0.77  --  0.68  CALCIUM 9.0  --  9.2    PT/INR:  Recent Labs  10/25/12 1600  LABPROT 17.5*  INR 1.48   ABG    Component Value Date/Time   PHART 7.383 10/26/2012 0508   HCO3 23.0 10/26/2012 0508   TCO2 24 10/26/2012 1655   ACIDBASEDEF 2.0 10/26/2012 0508   O2SAT 93.0 10/26/2012 0508   CBG (last 3)   Recent Labs  10/27/12 2000  10/28/12 0009 10/28/12 0407  GLUCAP 119* 111* 119*    Assessment/Plan: S/P Procedure(s) (LRB): CORONARY ARTERY BYPASS GRAFTING (CABG) (N/A) AORTIC VALVE REPLACEMENT (AVR) (N/A) INTRAOPERATIVE TRANSESOPHAGEAL ECHOCARDIOGRAM (N/A) POD # 3 AVR, CABG CV- hemodynamically stable, wean dopamine  RESP- he looks ready to extubate- plan to extubate today  RENAL- diuresed well the last 2 days- supplement K, hold off on further lasix today  Foley to stay in another week secondary to recent prostate procedure  CBG well controlled  Thrombocytopenia- stable, HIT panel pending, no lovenox- will place SCD hose   LOS: 11 days    HENDRICKSON,STEVEN C 10/28/2012

## 2012-10-28 NOTE — Procedures (Signed)
Extubation Procedure Note  Patient Details:   Name: [name] DOB: 06-23-1941 MRN: [id number]   Airway Documentation:     Evaluation  O2 sats: stable throughout Complications: No apparent complications Patient did tolerate procedure well. Bilateral Breath Sounds: Clear;Diminished Suctioning: Oral Yes  Pt tolerated wean, order to extubate. Pt positive for cuff leak, and was extubated to 4lpm [place]. Pt tolerated procedure well, no dypsnea or stridor after extubation. Pt achieved around 731mL x 4 on IS. Pt resting comfortably at this time. RT will continue to monitor.   Olive Bass 10/28/2012, 9:39 AM

## 2012-10-29 ENCOUNTER — Inpatient Hospital Stay (HOSPITAL_COMMUNITY): Payer: BC Managed Care – PPO

## 2012-10-29 LAB — CBC
MCH: 27.5 pg (ref 26.0–34.0)
MCV: 84.8 fL (ref 78.0–100.0)
Platelets: 76 10*3/uL — ABNORMAL LOW (ref 150–400)
RBC: 3.02 MIL/uL — ABNORMAL LOW (ref 4.22–5.81)
RDW: 18.2 % — ABNORMAL HIGH (ref 11.5–15.5)
WBC: 4.2 10*3/uL (ref 4.0–10.5)

## 2012-10-29 LAB — GLUCOSE, CAPILLARY
Glucose-Capillary: 128 mg/dL — ABNORMAL HIGH (ref 70–99)
Glucose-Capillary: 140 mg/dL — ABNORMAL HIGH (ref 70–99)
Glucose-Capillary: 152 mg/dL — ABNORMAL HIGH (ref 70–99)

## 2012-10-29 LAB — COMPREHENSIVE METABOLIC PANEL
Albumin: 3 g/dL — ABNORMAL LOW (ref 3.5–5.2)
Alkaline Phosphatase: 69 U/L (ref 39–117)
BUN: 22 mg/dL (ref 6–23)
CO2: 29 mEq/L (ref 19–32)
Chloride: 107 mEq/L (ref 96–112)
Creatinine, Ser: 0.78 mg/dL (ref 0.50–1.35)
GFR calc non Af Amer: 89 mL/min — ABNORMAL LOW (ref >90)
Potassium: 4.2 mEq/L (ref 3.5–5.1)
Total Bilirubin: 0.5 mg/dL (ref 0.3–1.2)

## 2012-10-29 MED ORDER — IPRATROPIUM BROMIDE 0.02 % IN SOLN
0.5000 mg | Freq: Four times a day (QID) | RESPIRATORY_TRACT | Status: DC
  Administered 2012-10-29 – 2012-11-02 (×17): 0.5 mg via RESPIRATORY_TRACT
  Filled 2012-10-29 (×18): qty 2.5

## 2012-10-29 MED ORDER — DEXAMETHASONE 4 MG PO TABS
4.0000 mg | ORAL_TABLET | Freq: Every day | ORAL | Status: DC
  Administered 2012-10-29 – 2012-11-04 (×7): 4 mg via ORAL
  Filled 2012-10-29 (×7): qty 1

## 2012-10-29 MED ORDER — POTASSIUM CHLORIDE CRYS ER 20 MEQ PO TBCR
20.0000 meq | EXTENDED_RELEASE_TABLET | Freq: Every day | ORAL | Status: DC
  Administered 2012-10-30 – 2012-11-01 (×3): 20 meq via ORAL
  Filled 2012-10-29 (×4): qty 1

## 2012-10-29 MED ORDER — ALBUTEROL SULFATE (5 MG/ML) 0.5% IN NEBU
2.5000 mg | INHALATION_SOLUTION | Freq: Four times a day (QID) | RESPIRATORY_TRACT | Status: DC
  Administered 2012-10-29 – 2012-11-02 (×18): 2.5 mg via RESPIRATORY_TRACT
  Filled 2012-10-29 (×18): qty 0.5

## 2012-10-29 MED ORDER — FUROSEMIDE 40 MG PO TABS
40.0000 mg | ORAL_TABLET | Freq: Every day | ORAL | Status: DC
  Filled 2012-10-29: qty 1

## 2012-10-29 NOTE — Progress Notes (Signed)
Clinical Social Work: Engineer, building services met with pt, pt's wife, and daughter who were seated at bedside. Prior to hospitalization pt was at [facility]. Pt and family are requesting home health services for pt because he has been away from home almost four months now. Pt's wife states that she would be able to assist patient with transfers and ambulation's if he is not totally dependent. Pt has strong family support and is positive about recovery. Pt states that if he is unable to d/c with home health that he would like to go to rehab here at [facility]. If both of those options fall through then he will go back to [facility] but requests a different doctor once he arrives there. Family understands that pt will need rehab before returning home and will assist how ever they can in the recovery process. Home Health is first choice for pt and family.

## 2012-10-29 NOTE — Evaluation (Signed)
Physical Therapy Evaluation Patient Details Name: [name] MRN: [id number] DOB: 27-Sep-1940 Today's Date: 10/29/2012 Time: 1201-1227 PT Time Calculation (min): 26 min  PT Assessment / Plan / Recommendation Clinical Impression  Pt is a 72 y.o. male s/p CABG and AVR. Patient demonstrates some deficits in functional mobility secondary to pain, precautions, deconditioning and generalized weakness. Patient very motivated and did well upon evaluation. Educated patient and family regarding importance of use of IS and pulmonary clearance. Pt able to ambulate increased distance today with rw. Pt and family desire d/c home upon leaving hospital. Anticipate that with continued progress patient will be safe for d/c home with HHPT services upon discharge. Will continue to follow and progress acitivty as tolerated.    PT Assessment  Patient needs continued PT services    Follow Up Recommendations  Home health PT;Supervision for mobility/OOB    Does the patient have the potential to tolerate intense rehabilitation      Barriers to Discharge None has good family support    Equipment Recommendations  Other (comment) (Rollator with seat secondary to endurance deficits)    Recommendations for Other Services     Frequency Min 3X/week    Precautions / Restrictions Precautions Precautions: Sternal Precaution Comments: Educated pt and family reguarding sternal precautions; will provide handout tomorrow   Pertinent Vitals/Pain VSS, SpO2 with ambulation to 93% on 4 liters; 3/10 pain      Mobility  Bed Mobility Bed Mobility: Not assessed Transfers Transfers: Sit to Stand;Stand to Sit Sit to Stand: 3: Mod assist Stand to Sit: 4: Min assist Details for Transfer Assistance: VCs for use of pillow to splint and sternal precautions education (used rocker technique) Ambulation/Gait Ambulation/Gait Assistance: 4: Min guard Ambulation Distance (Feet): 85 Feet Assistive device: Rolling  walker Ambulation/Gait Assistance Details: VCs for minimal weight bearing on rw, overall good stability. Increased respirations noted with VCs for slowed pursed lip breathing Gait Pattern: Step-through pattern;Decreased stride length;Trunk flexed;Narrow base of support Gait velocity: decreased General Gait Details: good stability with generalized guarded cautious gait Stairs: No    Exercises General Exercises - Lower Extremity Ankle Circles/Pumps: AROM;10 reps Long Arc Quad: AROM;10 reps   PT Diagnosis: Difficulty walking;Generalized weakness;Acute pain  PT Problem List: Decreased strength;Decreased activity tolerance;Decreased mobility;Decreased knowledge of precautions;Cardiopulmonary status limiting activity;Pain PT Treatment Interventions: DME instruction;Gait training;Stair training;Functional mobility training;Therapeutic activities;Therapeutic exercise;Patient/family education   PT Goals Acute Rehab PT Goals PT Goal Formulation: With patient/family Time For Goal Achievement: 11/12/12 Potential to Achieve Goals: Good Pt will go Supine/Side to Sit: with min assist PT Goal: Supine/Side to Sit - Progress: Goal set today Pt will go Sit to Supine/Side: with min assist PT Goal: Sit to Supine/Side - Progress: Goal set today Pt will go Sit to Stand: with modified independence PT Goal: Sit to Stand - Progress: Goal set today Pt will go Stand to Sit: with modified independence PT Goal: Stand to Sit - Progress: Goal set today Pt will Ambulate: >150 feet;with modified independence PT Goal: Ambulate - Progress: Goal set today Pt will Go Up / Down Stairs: 1-2 stairs;with min assist PT Goal: Up/Down Stairs - Progress: Goal set today Additional Goals Additional Goal #1: Patient will comply with sternal precautions at all times  Visit Information  Last PT Received On: 10/29/12 Assistance Needed: +2 (for line management and chair follow)    Subjective Data  Subjective: I don't know how  much I can do Patient Stated Goal: to go home   Prior [place]  Living Lives With: Spouse Available Help at Discharge: Family;Available 24 hours/day (Will plan to d/c to daughters) Type of Home: House Home Access: Stairs to enter [employer] of Steps: 2 Entrance Stairs-Rails: None Home Layout: One level Bathroom Shower/Tub: Chiropodist: Standard Home Adaptive Equipment: Bedside commode/3-in-1;Grab bars around toilet;Grab bars in shower;Walker - rolling Prior Function Level of Independence: Independent Able to Take Stairs?: Yes Driving: Yes Vocation: Retired Dominant Hand: Right    Cognition  Cognition Arousal/Alertness: Awake/alert Behavior During Therapy: WFL for tasks assessed/performed Overall Cognitive Status: Within Functional Limits for tasks assessed    Extremity/Trunk Assessment Right Upper Extremity Assessment RUE ROM/Strength/Tone: WFL for tasks assessed RUE Sensation: WFL - Light Touch;WFL - Proprioception RUE Coordination: WFL - gross/fine motor Left Upper Extremity Assessment LUE ROM/Strength/Tone: WFL for tasks assessed LUE Sensation: WFL - Light Touch;WFL - Proprioception;Deficits LUE Sensation Deficits: some small areas of numbness reported  LUE Coordination: WFL - gross/fine motor Right Lower Extremity Assessment RLE ROM/Strength/Tone: WFL for tasks assessed RLE Sensation: WFL - Light Touch;WFL - Proprioception RLE Coordination: WFL - gross/fine motor Left Lower Extremity Assessment LLE ROM/Strength/Tone: WFL for tasks assessed LLE Sensation: WFL - Light Touch;WFL - Proprioception;Deficits LLE Sensation Deficits: some areas of numbness reported LLE Coordination: WFL - gross/fine motor   Balance Balance Balance Assessed: Yes Static Sitting Balance Static Sitting - Balance Support: Feet supported Static Sitting - Level of Assistance: 6: Modified independent (Device/Increase time) Static Sitting - Comment/# of  Minutes: during ther ex Static Standing Balance Static Standing - Balance Support: Bilateral upper extremity supported Static Standing - Level of Assistance: 5: Stand by assistance Static Standing - Comment/# of Minutes: during rest breaks  End of Session PT - End of Session Equipment Utilized During Treatment: Gait belt;Oxygen Activity Tolerance: Patient tolerated treatment well Patient left: in chair;with call bell/phone within reach;with family/visitor present Nurse Communication: Mobility status  GP     Duncan Dull 10/29/2012, 2:22 PM Alben Deeds, [place] DPT  [phone]

## 2012-10-29 NOTE — Progress Notes (Signed)
Patient ID: [name], male   DOB: 1941/05/15, 72 y.o.   MRN: [id number]                   [address]            [place] [address]          [phone]     4 Days Post-Op Procedure(s) (LRB): CORONARY ARTERY BYPASS GRAFTING (CABG) (N/A) AORTIC VALVE REPLACEMENT (AVR) (N/A) INTRAOPERATIVE TRANSESOPHAGEAL ECHOCARDIOGRAM (N/A)  Total Length of Stay:  LOS: 12 days  BP 118/82  Pulse 103  Temp(Src) 98 F (36.7 C) (Oral)  Resp 24  Ht 5\' 6"  (1.676 m)  Wt 209 lb 3.5 oz (94.9 kg)  BMI 33.78 kg/m2  SpO2 95%  .Intake/Output     04/29 0701 - 04/30 0700   P.O. 360   I.V. (mL/kg) 240 (2.5)   IV Piggyback    Total Intake(mL/kg) 600 (6.3)   Urine (mL/kg/hr) 435 (0.4)   Stool 2 (0)   Chest Tube    Total Output 437   Net +163       Stool Occurrence 1 x     . sodium chloride 20 mL/hr at 10/26/12 2252  . sodium chloride 20 mL/hr at 10/25/12 1635  . sodium chloride    . lactated ringers 20 mL/hr at 10/25/12 1900     Lab Results  Component Value Date   WBC 4.2 10/29/2012   HGB 8.3* 10/29/2012   HCT 25.6* 10/29/2012   PLT 76* 10/29/2012   GLUCOSE 134* 10/29/2012   CHOL 151 10/25/2012   TRIG 140 10/25/2012   HDL 47 10/25/2012   LDLCALC 76 10/25/2012   ALT 26 10/29/2012   AST 27 10/29/2012   NA 142 10/29/2012   K 4.2 10/29/2012   CL 107 10/29/2012   CREATININE 0.78 10/29/2012   BUN 22 10/29/2012   CO2 29 10/29/2012   TSH 2.253 10/19/2012   INR 1.48 10/25/2012   HGBA1C 5.5 10/24/2012   Thrombocytopenia Tolerating extubation Walked in unit 100 ft   Grace Isaac MD  Beeper [phone] Office [phone] 10/29/2012 7:42 PM

## 2012-10-29 NOTE — Progress Notes (Signed)
Re faxed updated FL2

## 2012-10-29 NOTE — Care Management Note (Addendum)
    Page 1 of 2   11/04/2012     4:29:26 PM   CARE MANAGEMENT NOTE 11/04/2012  Patient:  [name], [name]   Account Number:  [id number]  Date Initiated:  10/18/2012  Documentation initiated by:  Llana Aliment  Subjective/Objective Assessment:   72yo male admitted from [facility], with CHF, and will return there on discharge.  Prior to [facility], pt. was at [facility] with tracheostomy.     Action/Plan:   Discharge planning   Anticipated DC Date:  10/21/2012   Anticipated DC Plan:  SKILLED NURSING FACILITY  In-house referral  Clinical Social Worker      DC Planning Services  CM consult      Choice offered to / List presented to:             Status of service:  Completed, signed off Medicare Important Message given?   (If response is "NO", the following Medicare IM given date fields will be blank) Date Medicare IM given:   Date Additional Medicare IM given:    Discharge Disposition:  [place]  Per UR Regulation:  Reviewed for med. necessity/level of care/duration of stay  If discussed at [place] of Stay Meetings, dates discussed:   10/24/2012    Comments:  11/04/12 Antrone Walla,RN,BSN [id number] PT FOR DC TO [facility] TODAY, PER CSW ARRANGEMENTS.  10-30-12 3pm Luz Lex, RNBSN [phone] Talked with patient again today.  Feels prior to going home he will need to go back to [facility] for rehab and strenghtening - hopefully ST - week or two.  Discussed patients concerns with DPT.  Felt that he could qualify for rehab at SNF if patient and family wanted that, however yesterday family wanted to take him home and patient was in agreement.  DPT plans to work with patient and family tomorrow and specifically address taking home and [facility] vs SNF  10-29-12 3:50pm Luz Lex, RNBSN Talked with wife and daughter and patient at bedside. Patient up in chair.  Would like to take him home on discharge if he is able.  At this time wife/patient and daughter at this time do no feel  he is ready to go home. Are looking at maybe need for inhouse rehab or [facility].  Pt recommending HH at this time.  CM will continue to follow.  10-25-12 3:30pm Luz Lex, RNBSN [phone] Post op CABG and AVR.  10/24/12 1305  Pt. had cardiac catheterization, which revealed severe 3 vessel disease and severe Aortic stenosis.  Pt. to have CABG and AVR tomorrow.  Pt. has support from spouse and family.  He may dc home with [place] vs SNF.  Pt. was at [facility] prior to admission.  NCM will follow for dc needs. Llana Aliment, RN, BSN [place] [phone]  10/18/12 1313 Noted CM referral for Heart Failure [place].  Pt. is from [facility], and will return there.  Butch Penny, CSW, aware of pt. admission. Llana Aliment, RN, BSN NCM [phone]

## 2012-10-29 NOTE — Progress Notes (Signed)
PULMONARY  / CRITICAL CARE MEDICINE  Name: [name] MRN: [id number] DOB: July 21, 1940    ADMISSION DATE:  10/17/2012 CONSULTATION DATE:  10/23/12  REFERRING MD :  Dr.  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 4/21 - ECHO>>EF 35-40%, moderate AS 4/22 - Cardiac Cath >>Severe 3 vessel disease (LAD 95%, LCx 70-80%, RCA 90%)  with moderate systolic dysfunction and moderate to severe aortic stenosis  4/23 - PFT with moderate mixed obstructive/restrictive defect with severely reduced DLCO.   4/23 - Pre-CABG Doppler >>R ICA without hemodynamically significant stenosis.  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 4/25 CABG/AVR 4/25 FOB for total collapse of L lung with removal of mucus plug  LINES / TUBES: ETT 4/25 >> 4/28  CULTURES: 4/17 MRSA PCR>>>neg  ANTIBIOTICS: vanco 4/25 (post-op)   SUBJECTIVE:  Successful extubation Difficulty with CPAP last night  VITAL SIGNS: Temp:  [97.6 F (36.4 C)-98.1 F (36.7 C)] 97.9 F (36.6 C) (04/29 0749) Pulse Rate:  [93-123] 104 (04/29 1100) Resp:  [19-31] 25 (04/29 1100) BP: (60-145)/(44-113) 129/81 mmHg (04/29 1100) SpO2:  [85 %-100 %] 95 % (04/29 1100) Arterial Line BP: (79-164)/(36-128) 144/87 mmHg (04/29 0700) Weight:  [94.9 kg (209 lb 3.5 oz)] 94.9 kg (209 lb 3.5 oz) (04/29 0500)  PHYSICAL EXAMINATION: General: up to chair Neuro: non-focal, strong cough HEENT: WNL Cardiovascular: RRR with syst M Lungs: diminished on L, no wheeze Abdomen:  Obese/soft Ext: symmetric ankle edema    Recent  Labs Lab 10/27/12 0437 10/27/12 2029 10/28/12 0400 10/29/12 0430  NA 138  --  139 142  K 3.8 3.3* 3.5 4.2  CL 106  --  105 107  CO2 25  --  25 29  BUN 19  --  16 22  CREATININE 0.77  --  0.68 0.78  GLUCOSE 150*  --  139* 134*    Recent Labs Lab 10/27/12 0437 10/28/12 0400 10/29/12 0430  HGB 8.6* 8.9* 8.3*  HCT 26.7* 26.7* 25.6*  WBC 6.4 5.4 4.2  PLT 65* 64* 76*    Recent Labs Lab 10/25/12 1621 10/25/12 1733 10/25/12 1918 10/25/12 2057 10/26/12 0508 10/26/12 1655 10/28/12 1456  PHART 7.311* 7.274* 7.329*  --  7.383  --  7.363  PCO2ART 52.3* 55.8* 48.9*  --  38.9  --  32.1*  PO2ART 57.0* 107.0* 70.0*  --  74.0*  --  66.0*  HCO3 26.7* 26.1* 25.8*  --  23.0  --  18.4*  TCO2 28 28 27 24 24 24 19   O2SAT 88.0 97.0 92.0  --  93.0  --  93.0    CXR: 4/29 >  IMPRESSION:  stable enlargement cardiopericardial silhouette and vascular congestion  with probable interstitial edema   ASSESSMENT / PLAN: COPD OSA 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 Bronchodilators ordered Will need  CPAP qhs > some difficulty w auto-titration device 4/28 pm, will continue to use this. States that he was on CPAP 13 cmH2O at home, also on CPAP at [facility] w full face mask   Baltazar Apo, MD, PhD 10/29/2012, 11:10 AM [place] [phone] or if no answer [phone]

## 2012-10-29 NOTE — Progress Notes (Signed)
4 Days Post-Op Procedure(s) (LRB): CORONARY ARTERY BYPASS GRAFTING (CABG) (N/A) AORTIC VALVE REPLACEMENT (AVR) (N/A) INTRAOPERATIVE TRANSESOPHAGEAL ECHOCARDIOGRAM (N/A) Subjective: Feels better this AM Some cough, no significant pain, denies nausea   Objective: Vital signs in last 24 hours: Temp:  [97.6 F (36.4 C)-98.4 F (36.9 C)] 97.9 F (36.6 C) (04/29 0400) Pulse Rate:  [82-123] 104 (04/29 0600) Cardiac Rhythm:  [-] Sinus tachycardia (04/29 0400) Resp:  [20-31] 25 (04/29 0600) BP: (60-145)/(44-113) 134/90 mmHg (04/29 0600) SpO2:  [85 %-100 %] 99 % (04/29 0600) Arterial Line BP: (79-164)/(36-128) 134/128 mmHg (04/29 0400) FiO2 (%):  [40 %-40.2 %] 40 % (04/28 0811) Weight:  [209 lb 3.5 oz (94.9 kg)] 209 lb 3.5 oz (94.9 kg) (04/29 0500)  Hemodynamic parameters for last 24 hours:    Intake/Output from previous day: 04/28 0701 - 04/29 0700 In: 2010.1 [P.O.:1140; I.V.:570.1; IV Piggyback:300] Out: [id number] [Urine:1245; Chest Tube:30] Intake/Output this shift:    General appearance: alert and no distress Neurologic: intact Heart: regular rate and rhythm Lungs: diminished breath sounds bibasilar Abdomen: normal findings: soft, non-tender Wound: clean and dry  Lab Results:  Recent Labs  10/28/12 0400 10/29/12 0430  WBC 5.4 4.2  HGB 8.9* 8.3*  HCT 26.7* 25.6*  PLT 64* 76*   BMET:  Recent Labs  10/28/12 0400 10/29/12 0430  NA 139 142  K 3.5 4.2  CL 105 107  CO2 25 29  GLUCOSE 139* 134*  BUN 16 22  CREATININE 0.68 0.78  CALCIUM 9.2 9.3    PT/INR: No results found for this basename: LABPROT, INR,  in the last 72 hours ABG    Component Value Date/Time   PHART 7.363 10/28/2012 1456   HCO3 18.4* 10/28/2012 1456   TCO2 19 10/28/2012 1456   ACIDBASEDEF 7.0* 10/28/2012 1456   O2SAT 93.0 10/28/2012 1456   CBG (last 3)   Recent Labs  10/28/12 1948 10/28/12 2224 10/29/12 0423  GLUCAP 187* 164* 128*    Assessment/Plan: S/P Procedure(s) (LRB): CORONARY  ARTERY BYPASS GRAFTING (CABG) (N/A) AORTIC VALVE REPLACEMENT (AVR) (N/A) INTRAOPERATIVE TRANSESOPHAGEAL ECHOCARDIOGRAM (N/A) Mobilize CV- BP much improved, there may have been a combination of factors most likely intravascular volume depletion and likely some component of adrenal insufficiency(had been on decadron preop)  Now has good BP off dopamine  RESP- relatively stable but needs to continue to work on pulmonary hygiene  RENAL- lytes, creatinine OK, hold diuresis today and restart PO lasix tomorrow  ENDO- CBG up late yesterday after solumedrol- should be better today  Deconditioning- will ask PT to see  Keep foley secondary to recent prostate procedure   LOS: 12 days    [name] C 10/29/2012

## 2012-10-30 LAB — GLUCOSE, CAPILLARY
Glucose-Capillary: 100 mg/dL — ABNORMAL HIGH (ref 70–99)
Glucose-Capillary: 111 mg/dL — ABNORMAL HIGH (ref 70–99)
Glucose-Capillary: 121 mg/dL — ABNORMAL HIGH (ref 70–99)
Glucose-Capillary: 139 mg/dL — ABNORMAL HIGH (ref 70–99)
Glucose-Capillary: 164 mg/dL — ABNORMAL HIGH (ref 70–99)

## 2012-10-30 LAB — BASIC METABOLIC PANEL
CO2: 28 mEq/L (ref 19–32)
Chloride: 107 mEq/L (ref 96–112)
GFR calc Af Amer: >90 mL/min (ref >90)
Potassium: 3.9 mEq/L (ref 3.5–5.1)
Sodium: 143 mEq/L (ref 135–145)

## 2012-10-30 LAB — CBC
HCT: 25.8 % — ABNORMAL LOW (ref 39.0–52.0)
Hemoglobin: 8 g/dL — ABNORMAL LOW (ref 13.0–17.0)
MCH: 26.8 pg (ref 26.0–34.0)
MCHC: 31 g/dL (ref 30.0–36.0)
MCV: 86.3 fL (ref 78.0–100.0)
Platelets: 122 K/uL — ABNORMAL LOW (ref 150–400)
RBC: 2.99 MIL/uL — ABNORMAL LOW (ref 4.22–5.81)
RDW: 18.1 % — ABNORMAL HIGH (ref 11.5–15.5)
WBC: 5.6 K/uL (ref 4.0–10.5)

## 2012-10-30 MED ORDER — LISINOPRIL 5 MG PO TABS
5.0000 mg | ORAL_TABLET | Freq: Every day | ORAL | Status: DC
  Administered 2012-10-30 – 2012-11-03 (×5): 5 mg via ORAL
  Filled 2012-10-30 (×6): qty 1

## 2012-10-30 MED ORDER — TRAMADOL HCL 50 MG PO TABS
50.0000 mg | ORAL_TABLET | ORAL | Status: DC | PRN
  Administered 2012-11-01: 100 mg via ORAL
  Filled 2012-10-30: qty 2

## 2012-10-30 MED ORDER — SODIUM CHLORIDE 0.9 % IV SOLN: 250.0000 mL | INTRAVENOUS | Status: DC | PRN

## 2012-10-30 MED ORDER — SODIUM CHLORIDE 0.9 % IJ SOLN: 10.0000 mL | Freq: Two times a day (BID) | INTRAMUSCULAR | Status: DC

## 2012-10-30 MED ORDER — SODIUM CHLORIDE 0.9 % IJ SOLN
10.0000 mL | INTRAMUSCULAR | Status: DC | PRN
  Administered 2012-10-30: 20 mL

## 2012-10-30 MED ORDER — METOPROLOL TARTRATE 25 MG PO TABS
25.0000 mg | ORAL_TABLET | Freq: Two times a day (BID) | ORAL | Status: DC
  Administered 2012-10-30 (×2): 25 mg via ORAL
  Filled 2012-10-30 (×4): qty 1

## 2012-10-30 MED ORDER — SODIUM CHLORIDE 0.9 % IJ SOLN: 3.0000 mL | INTRAMUSCULAR | Status: DC | PRN

## 2012-10-30 MED ORDER — SODIUM CHLORIDE 0.9 % IJ SOLN
3.0000 mL | Freq: Two times a day (BID) | INTRAMUSCULAR | Status: DC
  Administered 2012-10-31: 3 mL via INTRAVENOUS

## 2012-10-30 MED ORDER — MAGNESIUM HYDROXIDE 400 MG/5ML PO SUSP: 30.0000 mL | Freq: Every day | ORAL | Status: DC | PRN

## 2012-10-30 MED ORDER — GUAIFENESIN ER 600 MG PO TB12
600.0000 mg | ORAL_TABLET | Freq: Two times a day (BID) | ORAL | Status: DC
  Administered 2012-10-30 – 2012-11-04 (×10): 600 mg via ORAL
  Filled 2012-10-30 (×11): qty 1

## 2012-10-30 MED ORDER — TORSEMIDE 10 MG PO TABS
10.0000 mg | ORAL_TABLET | Freq: Every day | ORAL | Status: DC
  Administered 2012-10-30 – 2012-10-31 (×2): 10 mg via ORAL
  Filled 2012-10-30 (×3): qty 1

## 2012-10-30 MED ORDER — METOPROLOL TARTRATE 25 MG/10 ML ORAL SUSPENSION
25.0000 mg | Freq: Two times a day (BID) | ORAL | Status: DC
  Filled 2012-10-30 (×4): qty 10

## 2012-10-30 MED ORDER — MOVING RIGHT ALONG BOOK
Freq: Once | Status: AC
  Administered 2012-10-30: 18:00:00
  Filled 2012-10-30: qty 1

## 2012-10-30 MED ORDER — INSULIN ASPART 100 UNIT/ML ~~LOC~~ SOLN
0.0000 [IU] | Freq: Three times a day (TID) | SUBCUTANEOUS | Status: DC
  Administered 2012-10-30 – 2012-10-31 (×4): 2 [IU] via SUBCUTANEOUS
  Administered 2012-11-01: 4 [IU] via SUBCUTANEOUS
  Administered 2012-11-01 – 2012-11-03 (×5): 2 [IU] via SUBCUTANEOUS
  Administered 2012-11-03: 4 [IU] via SUBCUTANEOUS
  Administered 2012-11-03: 2 [IU] via SUBCUTANEOUS

## 2012-10-30 NOTE — Progress Notes (Signed)
5 Days Post-Op Procedure(s) (LRB): CORONARY ARTERY BYPASS GRAFTING (CABG) (N/A) AORTIC VALVE REPLACEMENT (AVR) (N/A) INTRAOPERATIVE TRANSESOPHAGEAL ECHOCARDIOGRAM (N/A) Subjective: Feels well this AM +BM x 4 yesterday  Objective: Vital signs in last 24 hours: Temp:  [97.5 F (36.4 Pierce)-98.2 F (36.8 Pierce)] 97.8 F (36.6 Pierce) (04/30 0747) Pulse Rate:  [39-110] 94 (04/30 0800) Cardiac Rhythm:  [-] Normal sinus rhythm (04/30 0800) Resp:  [19-28] 22 (04/30 0800) BP: (112-144)/(73-117) 132/80 mmHg (04/30 0800) SpO2:  [90 %-100 %] 98 % (04/30 0827) Weight:  [210 lb 5.1 oz (95.4 kg)] 210 lb 5.1 oz (95.4 kg) (04/30 0500)  Hemodynamic parameters for last 24 hours:    Intake/Output from previous day: 04/29 0701 - 04/30 0700 In: 1060 [P.O.:600; I.V.:460] Out: 953 [Urine:950; Stool:3] Intake/Output this shift: Total I/O In: -  Out: 75 [Urine:75]  General appearance: alert and no distress Neurologic: intact Heart: regular rate and rhythm Lungs: diminished breath sounds bibasilar Abdomen: mildly distended, nontender, + BS Wound: clean and dry  Lab Results:  Recent Labs  10/29/12 0430 10/30/12 0500  WBC 4.2 5.6  HGB 8.3* 8.0*  HCT 25.6* 25.8*  PLT 76* 122*   BMET:  Recent Labs  10/29/12 0430 10/30/12 0500  NA 142 143  K 4.2 3.9  CL 107 107  CO2 29 28  GLUCOSE 134* 116*  BUN 22 24*  CREATININE 0.78 0.86  CALCIUM 9.3 9.3    PT/INR: No results found for this basename: LABPROT, INR,  in the last 72 hours ABG    Component Value Date/Time   PHART 7.363 10/28/2012 1456   HCO3 18.4* 10/28/2012 1456   TCO2 19 10/28/2012 1456   ACIDBASEDEF 7.0* 10/28/2012 1456   O2SAT 93.0 10/28/2012 1456   CBG (last 3)   Recent Labs  10/29/12 2028 10/30/12 0015 10/30/12 0441  GLUCAP 152* 139* 111*    Assessment/Plan: S/P Procedure(s) (LRB): CORONARY ARTERY BYPASS GRAFTING (CABG) (N/A) AORTIC VALVE REPLACEMENT (AVR) (N/A) INTRAOPERATIVE TRANSESOPHAGEAL ECHOCARDIOGRAM (N/A) Plan  for transfer to step-down: see transfer orders CV- stable s/p CABG  ASA, beta blocker, add ACE-I RESP- continue pulmonary hygiene, nebs  RENAL- lytes, creatinine OK, restart demadex  Adrenal insufficiency- continue decadron  Thrombocytopenia- improved- still waiting on HIT panel results  Deconditioning- continue PT  CBG well controlled  Transfer to step down   LOS: 13 days    [name] 10/30/2012

## 2012-10-30 NOTE — Progress Notes (Signed)
Pt t/x to unit 2000 in wheelchair on monitor. Report called to [facility] and she was present for pt arrival to unit and tele hookup.   Lorre Munroe

## 2012-10-30 NOTE — Progress Notes (Signed)
Physical Therapy Treatment Patient Details Name: [name] MRN: [id number] DOB: 12-30-1940 Today's Date: 10/30/2012 Time: 1012-1051 PT Time Calculation (min): 39 min  PT Assessment / Plan / Recommendation Comments on Treatment Session  Pt demonstrates improvements in ambulation and activity tolerance today. Patient continues to demonstrate impulsivity with mobility requiring continuous ques for controlled movements. Pt also demonstrateing aspects of non-compliance with sternal precautions. Performed continuous education of importance for following precautions.  Encouraged continued ambulation with nsg throughout the day as well as continued use of IS and pulmonary clearance techniques. Will continue to see and progress activity as tolerated.    Follow Up Recommendations  Home health PT;Supervision for mobility/OOB           Equipment Recommendations  Other (comment) (Rollator with seat secondary to endurance deficits)       Frequency Min 3X/week   Plan Discharge plan remains appropriate    Precautions / Restrictions Precautions Precautions: Sternal Precaution Comments: Continue to educate and reinforce sternal precautions (questionable compliance)   Pertinent Vitals/Pain Patient reports no pain at this time. VSS prior to and throughout session. Supplemental O2 on 2 liters.    Mobility  Bed Mobility Bed Mobility: Supine to Sit;Sitting - Scoot to [employer] of Bed;Sit to Supine Supine to Sit: 4: Min assist Sitting - Scoot to [employer] of Bed: 4: Min guard Sit to Supine: 4: Min assist Details for Bed Mobility Assistance: Assist for elevation and controlled lowering of trunk to comply with sternal precautions Transfers Transfers: Sit to Stand;Stand to Sit Sit to Stand: 3: Mod assist Stand to Sit: 4: Min assist Details for Transfer Assistance: Continued VCs for use of pillow to splint and sternal precautions education (used rocker technique) Ambulation/Gait Ambulation/Gait Assistance:  4: Min guard Ambulation Distance (Feet): 200 Feet (rest break at approx 90 feet) Assistive device: Rolling walker Gait Pattern: Step-through pattern;Decreased stride length;Trunk flexed;Narrow base of support Gait velocity: decreased General Gait Details: good stability with generalized guarded cautious gait Stairs: No    Exercises General Exercises - Lower Extremity Ankle Circles/Pumps: AROM;10 reps Long Arc Quad: AROM;10 reps Hip Flexion/Marching: AROM;10 reps     PT Goals Acute Rehab PT Goals PT Goal Formulation: With patient/family Time For Goal Achievement: 11/12/12 Potential to Achieve Goals: Good Pt will go Supine/Side to Sit: with min assist PT Goal: Supine/Side to Sit - Progress: Progressing toward goal Pt will go Sit to Supine/Side: with min assist PT Goal: Sit to Supine/Side - Progress: Progressing toward goal Pt will go Sit to Stand: with modified independence PT Goal: Sit to Stand - Progress: Progressing toward goal Pt will go Stand to Sit: with modified independence PT Goal: Stand to Sit - Progress: Progressing toward goal Pt will Ambulate: >150 feet;with modified independence PT Goal: Ambulate - Progress: Progressing toward goal Pt will Go Up / Down Stairs: 1-2 stairs;with min assist Additional Goals Additional Goal #1: Patient will comply with sternal precautions at all times PT Goal: Additional Goal #1 - Progress:  (will continue to educate)  Visit Information  Last PT Received On: 10/30/12 Assistance Needed: +2 (for line management and chair follow)    Subjective Data  Subjective: I have been in bed for only 15 minutes but I will try Patient Stated Goal: to go home   Cognition  Cognition Arousal/Alertness: Awake/alert Behavior During Therapy: WFL for tasks assessed/performed Overall Cognitive Status: Within Functional Limits for tasks assessed    Balance  Balance Balance Assessed: Yes Static Sitting Balance Static Sitting - Balance Support:  Feet  supported Static Sitting - Level of Assistance: 6: Modified independent (Device/Increase time) Static Sitting - Comment/# of Minutes: during ther ex and seated rest break Static Standing Balance Static Standing - Balance Support: Bilateral upper extremity supported Static Standing - Level of Assistance: 5: Stand by assistance Static Standing - Comment/# of Minutes: during rest breaks  End of Session PT - End of Session Equipment Utilized During Treatment: Gait belt;Oxygen Activity Tolerance: Patient tolerated treatment well Patient left: in bed;with call bell/phone within reach Nurse Communication: Mobility status   GP     Duncan Dull 10/30/2012, 11:44 AM Alben Deeds, PT DPT  [phone]

## 2012-10-30 NOTE — Progress Notes (Signed)
Placed pt on cpap as per order. Pt. Is wearing full face mask. cpap set on auto titrate. Pt. States the cpap is comfortable.

## 2012-10-30 NOTE — Clinical Documentation Improvement (Signed)
    SHOCK DOCUMENTATION CLARIFICATION QUERY  CLINICAL DOCUMENTATION QUERIES ARE NOT PART OF THE PERMANENT MEDICAL RECORD  Please update your documentation within the medical record to reflect your response to this query.                                                                                         10/30/12   Dr. Roxan Hockey,  In a better effort to capture your patient's severity of illness, reflect appropriate length of stay and utilization of resources, a review of the patient medical record has revealed the following information:   "CTSP for hypotension  He was extubated earlier today without difficulty  After being up in chair he became hypotensive and tachycardic  He was given 2 bottles of albumin and dopamine increased back to 3 mcg/kg/min  He is still hypotensive with a BP of 82/50 and tachycardic with a HR of 115"  Progress Notes signed by Melrose Nakayama, MD at [id number] PM   Systolic BP's in the 0000000 to 80's per Doc Flowsheets in [place].  Heartrate in the 110's to 120's per Doc Flowsheets in [place]   "CV- BP much improved, there may have been a combination of factors most likely intravascular volume depletion and likely some component of adrenal insufficiency(had been on decadron preop)  Now has good BP off dopamine." Progress Notes signed by Melrose Nakayama, MD at 10/29/2012 7:53 AM     Based on your clinical judgment, please document in the progress notes and discharge summary if a condition below provides greater specificity regarding the patient's intravascular volume depletion and treatment provided:   - Hypovolemic Shock   - Other Condition   - Unable to Clinically Determine   In responding to this query please exercise your independent judgment.    The fact that a query is asked, does not imply that any particular answer is desired or expected.    Reviewed:  no additional documentation provided  Thank You,  Erling Conte  RN BSN  CCDS Certified Clinical Documentation Specialist: Cell   [phone]  Health Information Management [place]  TO RESPOND TO THE THIS QUERY, FOLLOW THE INSTRUCTIONS BELOW:  1. If needed, update documentation for the patient's encounter via the notes activity.  2. Access this query again and click edit on the In [place].  3. After updating, or not, click F2 to complete all highlighted (required) fields concerning your review. Select "additional documentation in the medical record" OR "no additional documentation provided".  4. Click Sign note button.  5. The deficiency will fall out of your In Basket *Please let us know if you are not able to complete this workflow by phone or e-mail (listed below).

## 2012-10-30 NOTE — Progress Notes (Signed)
PULMONARY  / CRITICAL CARE MEDICINE  Name: AZAREL MCNIFF MRN: [id number] DOB: 09-Apr-1941    ADMISSION DATE:  10/17/2012 CONSULTATION DATE:  10/23/12  REFERRING MD :  Dr.  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 4/21 - ECHO>>EF 35-40%, moderate AS 4/22 - Cardiac Cath >>Severe 3 vessel disease (LAD 95%, LCx 70-80%, RCA 90%)  with moderate systolic dysfunction and moderate to severe aortic stenosis  4/23 - PFT with moderate mixed obstructive/restrictive defect with severely reduced DLCO.   4/23 - Pre-CABG Doppler >>R ICA without hemodynamically significant stenosis.  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 4/25 CABG/AVR 4/25 FOB for total collapse of L lung with removal of mucus plug  LINES / TUBES: ETT 4/25 >> 4/28  CULTURES: 4/17 MRSA PCR>>>neg  ANTIBIOTICS: vanco 4/25 (post-op)   SUBJECTIVE:  Better job wearing CPAP last night Up to chair and eating  VITAL SIGNS: Temp:  [97.5 F (36.4 C)-98.2 F (36.8 C)] 97.8 F (36.6 C) (04/30 0747) Pulse Rate:  [39-110] 94 (04/30 0800) Resp:  [19-28] 22 (04/30 0800) BP: (112-144)/(73-117) 132/80 mmHg (04/30 0800) SpO2:  [90 %-100 %] 98 % (04/30 0827) Weight:  [95.4 kg (210 lb 5.1 oz)] 95.4 kg (210 lb 5.1 oz) (04/30 0500)  PHYSICAL EXAMINATION: General: up to chair Neuro: non-focal, strong cough HEENT: WNL Cardiovascular: RRR with syst M Lungs: diminished on L, no wheeze Abdomen:  Obese/soft Ext: symmetric ankle edema    Recent Labs Lab 10/28/12 0400 10/29/12 0430 10/30/12 0500  NA  139 142 143  K 3.5 4.2 3.9  CL 105 107 107  CO2 25 29 28   BUN 16 22 24*  CREATININE 0.68 0.78 0.86  GLUCOSE 139* 134* 116*    Recent Labs Lab 10/28/12 0400 10/29/12 0430 10/30/12 0500  HGB 8.9* 8.3* 8.0*  HCT 26.7* 25.6* 25.8*  WBC 5.4 4.2 5.6  PLT 64* 76* 122*    Recent Labs Lab 10/25/12 1621 10/25/12 1733 10/25/12 1918 10/25/12 2057 10/26/12 0508 10/26/12 1655 10/28/12 1456  PHART 7.311* 7.274* 7.329*  --  7.383  --  7.363  PCO2ART 52.3* 55.8* 48.9*  --  38.9  --  32.1*  PO2ART 57.0* 107.0* 70.0*  --  74.0*  --  66.0*  HCO3 26.7* 26.1* 25.8*  --  23.0  --  18.4*  TCO2 28 28 27 24 24 24 19   O2SAT 88.0 97.0 92.0  --  93.0  --  93.0    CXR: 4/29 >  IMPRESSION:  stable enlargement cardiopericardial silhouette and vascular congestion  with probable interstitial edema   ASSESSMENT / PLAN: COPD OSA 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 He is doing IS + flutter, starting to try ambulation Bronchodilators ordered; he was on duonebs qid prior to this admit Continue CPAP qhs > some difficulty w auto-titration  device 4/28 pm but better 4/29, will continue to use this. States that he was on CPAP 13 cmH2O at home, also on CPAP at [facility] w full face mask  He is transitioning to SDU today 4/30. Please call us if we can help in any way.   Baltazar Apo, MD, PhD 10/30/2012, 9:03 AM [facility] [phone] or if no answer [phone]

## 2012-10-31 ENCOUNTER — Inpatient Hospital Stay (HOSPITAL_COMMUNITY): Payer: BC Managed Care – PPO

## 2012-10-31 LAB — BASIC METABOLIC PANEL
BUN: 24 mg/dL — ABNORMAL HIGH (ref 6–23)
CO2: 27 mEq/L (ref 19–32)
Calcium: 9.3 mg/dL (ref 8.4–10.5)
Chloride: 108 mEq/L (ref 96–112)
Creatinine, Ser: 0.77 mg/dL (ref 0.50–1.35)
GFR calc Af Amer: >90 mL/min (ref >90)
GFR calc non Af Amer: 90 mL/min — ABNORMAL LOW (ref >90)
Glucose, Bld: 117 mg/dL — ABNORMAL HIGH (ref 70–99)
Potassium: 3.8 mEq/L (ref 3.5–5.1)
Sodium: 144 mEq/L (ref 135–145)

## 2012-10-31 LAB — CBC
HCT: 26.6 % — ABNORMAL LOW (ref 39.0–52.0)
Hemoglobin: 8.2 g/dL — ABNORMAL LOW (ref 13.0–17.0)
MCH: 26.9 pg (ref 26.0–34.0)
RBC: 3.05 MIL/uL — ABNORMAL LOW (ref 4.22–5.81)

## 2012-10-31 LAB — GLUCOSE, CAPILLARY

## 2012-10-31 MED ORDER — SODIUM CHLORIDE 0.9 % IJ SOLN
10.0000 mL | INTRAMUSCULAR | Status: DC | PRN
  Administered 2012-10-31 – 2012-11-02 (×4): 10 mL
  Administered 2012-11-02: 20 mL
  Administered 2012-11-03 – 2012-11-04 (×4): 10 mL

## 2012-10-31 MED ORDER — FUROSEMIDE 10 MG/ML IJ SOLN
40.0000 mg | Freq: Once | INTRAMUSCULAR | Status: AC
  Administered 2012-10-31: 40 mg via INTRAVENOUS

## 2012-10-31 MED ORDER — METOPROLOL TARTRATE 50 MG PO TABS
50.0000 mg | ORAL_TABLET | Freq: Two times a day (BID) | ORAL | Status: DC
  Administered 2012-10-31 – 2012-11-03 (×7): 50 mg via ORAL
  Filled 2012-10-31 (×10): qty 1

## 2012-10-31 MED ORDER — FUROSEMIDE 10 MG/ML IJ SOLN
INTRAMUSCULAR | Status: AC
  Filled 2012-10-31: qty 4

## 2012-10-31 MED ORDER — MOMETASONE FURO-FORMOTEROL FUM 100-5 MCG/ACT IN AERO
2.0000 | INHALATION_SPRAY | Freq: Two times a day (BID) | RESPIRATORY_TRACT | Status: DC
  Administered 2012-10-31 – 2012-11-04 (×8): 2 via RESPIRATORY_TRACT
  Filled 2012-10-31: qty 8.8

## 2012-10-31 NOTE — Progress Notes (Signed)
Placed pt on cpap. Pt. Is wearing full face mask and is tolerating well at this time.

## 2012-10-31 NOTE — Progress Notes (Addendum)
[address]            [place] [address]          [phone]     6 Days Post-Op Procedure(s) (LRB): CORONARY ARTERY BYPASS GRAFTING (CABG) (N/A) AORTIC VALVE REPLACEMENT (AVR) (N/A) INTRAOPERATIVE TRANSESOPHAGEAL ECHOCARDIOGRAM (N/A)  Subjective: More sore today than he has been, but otherwise doing well.  Breathing stable, minimal cough.  Appetite good, +BM. Still weak with ambulation.   Objective: Vital signs in last 24 hours: Patient Vitals for the past 24 hrs:  BP Temp Temp src Pulse Resp SpO2 Weight  10/31/12 0510 132/82 mmHg 97.6 F (36.4 C) Oral 100 18 100 % 213 lb (96.616 kg)  10/30/12 2206 - - - 104 18 96 % -  10/30/12 2100 124/79 mmHg 100.2 F (37.9 C) Oral 104 20 97 % -  10/30/12 1957 - - - - - 95 % -  10/30/12 1649 133/87 mmHg - - 97 - 94 % -  10/30/12 1605 - 97.9 F (36.6 C) Oral - - - -  10/30/12 1600 142/107 mmHg - - 90 23 94 % -  10/30/12 1500 - - - 96 21 99 % -  10/30/12 1400 - - - 99 24 98 % -  10/30/12 1300 122/86 mmHg - - 95 18 91 % -  10/30/12 1226 - - - - - 95 % -  10/30/12 1200 118/84 mmHg - - 94 22 93 % -  10/30/12 1151 - 98.6 F (37 C) Oral - - - -  10/30/12 1100 122/87 mmHg - - 100 22 95 % -  10/30/12 1000 117/78 mmHg - - 98 21 95 % -  10/30/12 0900 131/92 mmHg - - 108 24 95 % -  10/30/12 0827 - - - - - 98 % -  10/30/12 0800 132/80 mmHg - - 94 22 98 % -  10/30/12 0747 - 97.8 F (36.6 C) Oral - - - -   Current Weight  10/31/12 213 lb (96.616 kg)  PRE-OPERATIVE WEIGHT: 90 kg    Intake/Output from previous day: 04/30 0701 - 05/01 0700 In: 480 [P.O.:480] Out: 1922 [Urine:1920; Stool:2]  CBGs 151-146-109-117    PHYSICAL EXAM:  Heart: RRR, around 100 Lungs: Coarse BS in R base Wound: Clean and dry Extremities: +LE edema    Lab Results: CBC: Recent Labs  10/30/12 0500 10/31/12 0525  WBC 5.6 7.8  HGB 8.0* 8.2*  HCT 25.8* 26.6*  PLT 122* 146*   BMET:  Recent Labs  10/30/12 0500  10/31/12 0525  NA 143 144  K 3.9 3.8  CL 107 108  CO2 28 27  GLUCOSE 116* 117*  BUN 24* 24*  CREATININE 0.86 0.77  CALCIUM 9.3 9.3    PT/INR: No results found for this basename: LABPROT, INR,  in the last 72 hours  CXR: stable  Assessment/Plan: S/P Procedure(s) (LRB): CORONARY ARTERY BYPASS GRAFTING (CABG) (N/A) AORTIC VALVE REPLACEMENT (AVR) (N/A) INTRAOPERATIVE TRANSESOPHAGEAL ECHOCARDIOGRAM (N/A)  CV-BPs remain elevated.  Will increase beta blocker since he is mildly tachy, continue ACE-I.  Vol overload- continue diuresis.  Adrenal insufficiency- continue decadron.  Thrombocytopenia- plts stable. HIT remains pending.  Expected postop blood loss anemia- H/H generally stable.  Watch.  GU- continue Foley, Flomax, Avodart.  Pulm- aggressive pulm toilet.  Deconditioning- PT/CRPI.  Disp- back to [facility] when medically stable.    LOS: 14  days    COLLINS,GINA H 10/31/2012  Patient seen and examined. Agree with above I don't know what happened to his HIT panel but it is vital given that he will need his aneurysms repaired in the near future. Will reorder

## 2012-10-31 NOTE — Progress Notes (Signed)
Physical Therapy Treatment Patient Details Name: [name] MRN: [id number] DOB: [birth year] Today's Date: 10/31/2012 Time: [id number] PT Time Calculation (min): 32 min  PT Assessment / Plan / Recommendation Comments on Treatment Session  Pt with modest progress today and increased anxiety with ambulation. Spoke with patient at length regarding assist requirements and possible discharge options. At this point patient may need ST SNF to improve activity tolerance, wean O2 dependence, and improve safety and functional mobility. Patient is in agreement. Will continue to see to progress as tolerated and facilitate d/c to SNF.    Follow Up Recommendations  SNF     Does the patient have the potential to tolerate intense rehabilitation     Barriers to Discharge        Equipment Recommendations  Other (comment) (Rollator with seat secondary to endurance deficits)    Recommendations for Other Services    Frequency Min 3X/week   Plan Discharge plan needs to be updated    Precautions / Restrictions Precautions Precautions: Sternal Precaution Comments: Continue to educate and reinforce sternal precautions (questionable compliance)   Pertinent Vitals/Pain No pain reported, weakness and fatigue. 4 Liters SpO2 for ambulation to keep >90%.    Mobility  Bed Mobility Bed Mobility: Supine to Sit;Sitting - Scoot to [employer] of Bed;Sit to Supine Supine to Sit: 4: Min assist Sitting - Scoot to [employer] of Bed: 4: Min guard Sit to Supine: 4: Min assist Details for Bed Mobility Assistance: Assist for elevation and controlled lowering of trunk to comply with sternal precautions Transfers Transfers: Sit to Stand;Stand to Sit Sit to Stand: 3: Mod assist Stand to Sit: 4: Min assist Details for Transfer Assistance: Max VCs for use of pillow to splint and sternal precautions, VCs provided prior to movement initiation to ensure carryover (used rocker technique) Ambulation/Gait Ambulation/Gait Assistance: 4:  Min guard Ambulation Distance (Feet): 160 Feet Assistive device: Rolling walker Ambulation/Gait Assistance Details: VCs for rest points every 10-15 feet with breathing and further instructions. Pt very impulsive and needs MAX VCs prior to movement to ensure control Gait Pattern: Step-through pattern;Decreased stride length;Trunk flexed;Narrow base of support Gait velocity: decreased General Gait Details: Patient still impulsive with ambulation and requires more VCs for controlled movement and pre-set rest breaks. Stairs: No    Exercises General Exercises - Lower Extremity Ankle Circles/Pumps: AROM;10 reps   PT Diagnosis:    PT Problem List:   PT Treatment Interventions:     PT Goals Acute Rehab PT Goals PT Goal Formulation: With patient/family Time For Goal Achievement: 11/12/12 Potential to Achieve Goals: Good Pt will go Supine/Side to Sit: with min assist PT Goal: Supine/Side to Sit - Progress: Progressing toward goal Pt will go Sit to Supine/Side: with min assist PT Goal: Sit to Supine/Side - Progress: Progressing toward goal Pt will go Sit to Stand: with modified independence PT Goal: Sit to Stand - Progress: Progressing toward goal Pt will go Stand to Sit: with modified independence PT Goal: Stand to Sit - Progress: Progressing toward goal Pt will Ambulate: >150 feet;with modified independence PT Goal: Ambulate - Progress: Progressing toward goal Pt will Go Up / Down Stairs: 1-2 stairs;with min assist Additional Goals Additional Goal #1: Patient will comply with sternal precautions at all times  Visit Information  Last PT Received On: 10/31/12 Assistance Needed: +2 (for line management and chair follow)    Subjective Data  Subjective: I just don't think I am progressing well Patient Stated Goal: to go home   Cognition  Cognition Arousal/Alertness: Awake/alert Behavior During Therapy: WFL for tasks assessed/performed Overall Cognitive Status: Within Functional  Limits for tasks assessed    Balance  Balance Balance Assessed: Yes Static Standing Balance Static Standing - Balance Support: Bilateral upper extremity supported Static Standing - Level of Assistance: 5: Stand by assistance  End of Session PT - End of Session Equipment Utilized During Treatment: Gait belt;Oxygen Activity Tolerance: Patient tolerated treatment well Patient left: in chair;with call bell/phone within reach;with chair alarm set Nurse Communication: Mobility status   GP     Duncan Dull 10/31/2012, 4:02 PM Alben Deeds, [place] DPT  [phone]

## 2012-10-31 NOTE — Progress Notes (Signed)
Pt alert and oriented times 4, but confuse at time. Pt continuously getting out of bed. Safety precautions in place. He denies pain, will continue to monitor.

## 2012-10-31 NOTE — Progress Notes (Signed)
CARDIAC REHAB PHASE I   PRE:  Rate/Rhythm: 103 ST    BP: sitting 146/98    SaO2: 100 2 1/2 L  MODE:  Ambulation: 150 ft   POST:  Rate/Rhythm: 102 ST    BP: sitting 117/75     SaO2: 91 3L  Pt used RW, gait belt, assist x2 and 2 then 3L O2. Initially too quick of pace and became extremely fatigued/DOE after50 ft. Had to sit, slow breathing. Encouraged pt to slow pace and breathe deep with walking. Rest x1 more sitting break ( therefore x2 rests for 150 ft.) Exhausted after walk. SaO2 decrease with walking, needed 3L and periodic rest to keep above 90 %. Also BP down after walk. Pt does admit to some dizziness walking. To recliner. Will f/u as x2. [id number]   Josephina Shih [place] CES, ACSM 10/31/2012 10:41 AM

## 2012-10-31 NOTE — Progress Notes (Signed)
Peripherally Inserted Central Catheter/Midline Placement  The IV Nurse has discussed with the patient and/or persons authorized to consent for the patient, the purpose of this procedure and the potential benefits and risks involved with this procedure.  The benefits include less needle sticks, lab draws from the catheter and patient may be discharged home with the catheter.  Risks include, but not limited to, infection, bleeding, blood clot (thrombus formation), and puncture of an artery; nerve damage and irregular heat beat.  Alternatives to this procedure were also discussed.  PICC/Midline Placement Documentation  PICC Triple Lumen [id number] PICC Left Basilic (Active)       [name] 10/31/2012, 9:21 AM

## 2012-10-31 NOTE — Progress Notes (Signed)
PT Cancellation Note  Patient Details Name: [name] MRN: [id number] DOB: September 23, 1940   Cancelled Treatment:    Reason Eval/Treat Not Completed: Patient at procedure or test/unavailable (Pt is currently having sterile in room procedure.) Will follow up this afternoon to perform treatment and discuss discharge planning.   Duncan Dull 10/31/2012, 9:10 AM

## 2012-10-31 NOTE — Progress Notes (Signed)
Pt on assessment sounds very "wet" and has nonproductive congested cough. Pt's sats 94% on 2L. Pt states he just feels like his CHF is acting up. Pt attempted third walk today and only made it about 20 feet because was feeling very SOB and weak. Notified PA Jadene Pierini of this. Obtained orders for one time dose of lasix and breathing treatments. Will give and continue to monitor pt's status.

## 2012-11-01 LAB — GLUCOSE, CAPILLARY
Glucose-Capillary: 95 mg/dL (ref 70–99)
Glucose-Capillary: 137 mg/dL — ABNORMAL HIGH (ref 70–99)
Glucose-Capillary: 127 mg/dL — ABNORMAL HIGH (ref 70–99)
Glucose-Capillary: 165 mg/dL — ABNORMAL HIGH (ref 70–99)

## 2012-11-01 MED ORDER — TORSEMIDE 20 MG PO TABS
20.0000 mg | ORAL_TABLET | Freq: Two times a day (BID) | ORAL | Status: DC
  Administered 2012-11-01 – 2012-11-04 (×7): 20 mg via ORAL
  Filled 2012-11-01 (×9): qty 1

## 2012-11-01 NOTE — Progress Notes (Signed)
Pt ambulated for second time today approx. 200 feet with rolling walker and 3L [place]. Pt tolerated fairly well with frequent breaks to catch his breath. Pt back in bed with call bell in reach and set up for a bed bath. Will continue to monitor.

## 2012-11-01 NOTE — Progress Notes (Signed)
Physical Therapy Treatment Patient Details Name: [name] MRN: [id number] DOB: 26-Nov-1940 Today's Date: 11/01/2012 Time: 0930-1001 PT Time Calculation (min): 31 min  PT Assessment / Plan / Recommendation Comments on Treatment Session  Pt with better control today with ambulation and activity. Worked with patient on transfer training while maintaining sternal precautions. Will continue to work with and progress activity as tolerated.    Follow Up Recommendations  SNF     Does the patient have the potential to tolerate intense rehabilitation     Barriers to Discharge        Equipment Recommendations  Other (comment) (Rollator with seat secondary to endurance deficits)    Recommendations for Other Services    Frequency Min 3X/week   Plan Discharge plan needs to be updated    Precautions / Restrictions Precautions Precautions: Sternal Precaution Comments: Continue to educate and reinforce sternal precautions (questionable compliance) Restrictions Weight Bearing Restrictions: No   Pertinent Vitals/Pain No pain at this time; SpO2 on 3 liters ranger 94% to 83% with activity; increased O2 to 4 liters and SpO2 remained >87% with ambulation; rebounding well with rest and VCs for nasal breathing.    Mobility  Bed Mobility Bed Mobility: Supine to Sit;Sitting - Scoot to [employer] of Bed;Sit to Supine Supine to Sit: 4: Min assist Sitting - Scoot to [employer] of Bed: 4: Min guard Sit to Supine: 4: Min assist Details for Bed Mobility Assistance: Assist for elevation and controlled lowering of trunk to comply with sternal precautions Transfers Transfers: Sit to Stand;Stand to Sit Sit to Stand: 3: Mod assist Stand to Sit: 4: Min assist Details for Transfer Assistance: Performed x 4 to reinforce technique with  VCs for use of pillow to splint and sternal precautions, VCs provided prior to movement initiation to ensure carryover (used rocker technique) Ambulation/Gait Ambulation/Gait  Assistance: 4: Min guard Ambulation Distance (Feet): 150 Feet Assistive device: Rolling walker Ambulation/Gait Assistance Details: Multiple standing rest breaks Gait Pattern: Step-through pattern;Decreased stride length;Trunk flexed;Narrow base of support Gait velocity: decreased General Gait Details: Patient still impulsive with ambulation and requires more VCs for controlled movement and pre-set rest breaks. Stairs: No    Exercises General Exercises - Lower Extremity Ankle Circles/Pumps: AROM;10 reps Long Arc Quad: AROM;10 reps Hip Flexion/Marching: AROM;10 reps    PT Goals Acute Rehab PT Goals PT Goal Formulation: With patient/family Time For Goal Achievement: 11/12/12 Potential to Achieve Goals: Good Pt will go Supine/Side to Sit: with min assist PT Goal: Supine/Side to Sit - Progress: Progressing toward goal Pt will go Sit to Supine/Side: with min assist PT Goal: Sit to Supine/Side - Progress: Progressing toward goal Pt will go Sit to Stand: with modified independence PT Goal: Sit to Stand - Progress: Progressing toward goal Pt will go Stand to Sit: with modified independence PT Goal: Stand to Sit - Progress: Progressing toward goal Pt will Ambulate: >150 feet;with modified independence PT Goal: Ambulate - Progress: Progressing toward goal Pt will Go Up / Down Stairs: 1-2 stairs;with min assist Additional Goals Additional Goal #1: Patient will comply with sternal precautions at all times  Visit Information  Last PT Received On: 11/01/12 Assistance Needed: +2 (for line management and chair follow)    Subjective Data  Subjective: I had a rough night Patient Stated Goal: to go home   Cognition  Cognition Arousal/Alertness: Awake/alert Behavior During Therapy: WFL for tasks assessed/performed Overall Cognitive Status: Within Functional Limits for tasks assessed    Balance  Balance Balance Assessed: Yes Static  Standing Balance Static Standing - Balance Support:  Bilateral upper extremity supported Static Standing - Level of Assistance: 5: Stand by assistance Static Standing - Comment/# of Minutes: during rest breaks  End of Session PT - End of Session Equipment Utilized During Treatment: Gait belt;Oxygen Activity Tolerance: Patient tolerated treatment well Patient left: in bed;with call bell/phone within reach;with chair alarm set Nurse Communication: Mobility status   GP     Duncan Dull 11/01/2012, 10:44 AM Alben Deeds, PT DPT  [phone]

## 2012-11-01 NOTE — Discharge Summary (Addendum)
Physician Discharge Summary  Patient ID: [name] MRN: [id number] DOB/AGE: 1940-07-11 72 y.o.  Admit date: 10/17/2012 Discharge date: 11/04/2012  Admission Diagnoses: 1.Multivessel CAD 2.Moderate to severe aortic stenosis 3.History of hypertension 4.History of hyperlipidemia 5.History pre diabetes 6. History of OSA 7.History of COPD 8.History of CHF 9.History of tobacco abuse 10. 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 History of OSA 7.History of COPD 8.History of CHF 9.History of tobacco abuse 10. 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 ABL anemia 15.Urinary retention  Consults: vascular surgery (Dr. Kellie Simmering)                   CCM/pulmonary (Dr. Alva Garnet)  Procedure (s):  1. Cardiac catheterization done by Dr. [name] on 10/22/2012: Coronary angiography:  Coronary dominance: right  Left mainstem: 30-40% distal tapering.  Left anterior descending (LAD): 95% proximal LAD after 1st septal.  Left circumflex (LCx): 70-80% ostial stenosis in a large ramus. 70% mid LCx stenosis.  Right coronary artery (RCA): 90% irregular proximal RCA stenosis. Moderate luminals up to 40% throughout the RCA.  Left ventriculography: Not done, at least moderate AS by echo. EF around 35% by echo.  Final Conclusions: Severe 3 vessel disease with moderate systolic dysfunction and moderate to severe aortic stenosis (moderate by mean gradient, severe by calculated valve area - possible low gradient severe AS with low EF). Ideally, revascularization would be done by CABG with bioprosthetic aortic valve replacement. However, he has multiple comorbidities.  He had a long hospitalization for ARDS/PNA in 1/14 with trach, now recovered. He lives in a nursing home now but has been walking with a walker. He prostate cancer that is being treated with indwelling foley. 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 I will involve CVTS and vascular surgery before making decision on how to proceed. Could intervene percutaneously but would likely not be a full revascularization. Mildly elevated right and left heart filling pressures   2.Median sternotomy, extracorporeal circulation, coronary  artery bypass grafting x4 (left internal mammary artery to left anterior descending, sequential saphenous vein graft to ramus intermedius, and obtuse marginal 1, saphenous vein graft to posterior descending), aortic valve replacement with 21 mm [facility] Ease pericardial valve (model  number 3300 TFX, serial number [id number]), endoscopic vein harvest, both thighs by Dr. Roxan Hockey on 10/25/2012.   History of Presenting Illness: This is a 72 yo male with a complex recent medical history who presented with swelling in his feet and legs and shortness of breath. [name] is a 72 yo male with no prior history of CAD or aortic stenosis. He does have a history of heavy tobacco abuse and COPD, as well as sleep apnea and "borderline" type II DM. He was in his usual state of health and still working until he became ill in January of this year. He was admitted to [facility] in January with H1N1 and developed pneumonia and ARDS. He was intubated for about 2 weeks. During that time, he had an abdominal CT which showed a 6.5 cm AAA and 5 cm iliac aneurysm. He required a percutaneous drain for a parapneumonic effusion on the left. He required a tracheostomy  and ultimately went to [facility] where he was weaned and decannulated.  After a few weeks at [facility], he was sent to [facility] for further rehabilitation prio to going home. He was discharged from [facility]  on April 4th. He says that he started gaining weight (21 pounds in 2 weeks) while at [facility]. He was sent to [facility] and then transferred to [facility] for treatment of class IV CHF. He improved symptomatically with diuresis. An echocardiogram on 4/18 showed a dilated hypokinetic LV with an EF of 30-40% and severe AS with a valve area of 0.63. A repeat echo on 4/22 showed similar findings with mean gradient of 33 and peak of 50 mm Hg consistent with  moderate stenosis. A cardiac catheterization showed severe 3 vessel CAD and elevated right sided pressures. A cardiothoracic consultation was obtained for the consideration of coronary artery bypass grafting surgery. He underwent a CABG x 4 and AV replacement on 4/25. It should be noted that he will require vascular surgery to repair his aneurysms in a few months after he recover from heart surgery.   Brief Hospital Course:  The patient remained intubated for a couple of days post operatively. He was extubated late the afternoon of post operative day 3without difficulty. He was weaned off Neo synephrine. He then became hypotensive and tachycardic. He was started on Dopamine.An echo done showed LVEF 50-55%, moderate AS, and a small pericardial effusion. CXR showed no change from am CXR. There was no indication for a CT as his BP improved and he had no other symptoms to suggestion a leak or rupture from his AAA. He remained afebrile and hemodynamically stable. Gordy Councilman, a line, chest tubes, and foley were removed. He did have urinary retention. His foley did need to be re inserted. He was already restarted on Avodart and Flomax was also added. He will need to follow up with urology after discharge. He was Coreg was started and titrated accordingly. He did have thrombocytopenia post op. His platelet count went down to 76,000. His thrombocytopenia did resolve as his last platelet count was up to 198,000. He was started on Lisinopril as well.He was volume over loaded and  diuresed aggressively. He was weaned off the insulin drip.  The patient's HGA1C pre op was 5.5. The patient was felt surgically stable for transfer from the ICU to [facility] for further convalescence on 10/30/2012. He continues to progress with cardiac rehab. He was ambulating on 2-3 liters of oxygen via nasal cannula. He will require oxygen upon discharge. He has a history of COPD and post ARDS. He has been tolerating a diet and has had a bowel movement. Epicardial pacing wires and chest tube sutures will be removed prior to discharge. Provided the patient remains afebrile, hemodynamically stable, and pending morning round evaluation, He will be surgically stable for discharge to [facility] on 11/04/2012.   Latest Vital Signs: Blood pressure 92/65, pulse 97, temperature 97.7 F (36.5 C), temperature source Oral, resp. rate 18, height 5\' 6"  (1.676 m), weight 87.499 kg (192 lb 14.4 oz), SpO2 96.00%.  Physical Exam: Cardiovascular: RRR  Pulmonary: Rales but no wheezes, or rhonchi.  Abdomen: Soft, non tender, bowel sounds present.  Extremities: 2+ bilateral lower extremity edema.  Wounds: Clean and dry. No erythema or signs of infection.  Discharge Condition:Stable  Recent laboratory studies:  Lab Results  Component Value Date   WBC 10.8* 11/02/2012   HGB 8.8* 11/02/2012   HCT 28.7* 11/02/2012   MCV 87.8 11/02/2012   PLT 198  11/02/2012   Lab Results  Component Value Date   NA 141 11/04/2012   K 3.6 11/04/2012   CL 100 11/04/2012   CO2 33* 11/04/2012   CREATININE 0.86 11/04/2012   GLUCOSE 171* 11/04/2012      Diagnostic Studies:  Dg Chest 2 View  10/31/2012  *RADIOLOGY REPORT*  Clinical Data: Follow up atelectasis, status post CABG  CHEST - 2 VIEW  Comparison: Prior chest x-ray 10/29/2012  Findings: Improved inspiratory volumes, decreased pulmonary vascular congestion and decreased bibasilar atelectasis. Stable cardiomegaly.  The patient is status post median sternotomy with evidence of multivessel CABG.   Epicardial pacing wires are noted. There are small persistent bilateral pleural effusions.  Right IJ vascular sheath remains in unchanged position.  The tip projects over the right brachiocephalic vein.  No pneumothorax or other acute abnormality.  IMPRESSION:  1.  Slightly improved inspiratory volumes, decreased pulmonary vascular congestion and decreasing bibasilar atelectasis.  2.  Small bilateral pleural effusions persist.   Original Report Authenticated By: Jacqulynn Cadet, M.D.         Discharge Orders   Future Appointments Provider Department Dept Phone   12/03/2012 11:30 AM Melrose Nakayama, MD [facility] [place] [phone]   12/31/2012 9:15 AM Mal Misty, MD [facility] [phone]   Future Orders Complete By Expires     Amb Referral to Cardiac Rehabilitation  As directed     Comments:      Pt agrees to referral to [facility] program, will send referral.       Discharge Medications:   Medication List    STOP taking these medications       insulin regular 100 units/mL injection  Commonly known as:  NOVOLIN R,HUMULIN R     metoprolol tartrate 25 MG tablet  Commonly known as:  LOPRESSOR     torsemide 20 MG tablet  Commonly known as:  DEMADEX      TAKE these medications       acetaminophen 325 MG tablet  Commonly known as:  TYLENOL  Take 650 mg by mouth every 8 (eight) hours as needed for pain.     aspirin 325 MG EC tablet  Take 1 tablet (325 mg total) by mouth daily.     atorvastatin 80 MG tablet  Commonly known as:  LIPITOR  Take 1 tablet (80 mg total) by mouth daily.     carvedilol 3.125 MG tablet  Commonly known as:  COREG  Take 1 tablet (3.125 mg total) by mouth 2 (two) times daily with a meal.     clonazePAM 0.5 MG tablet  Commonly known as:  KLONOPIN  Take 0.5 mg by mouth 3 (three) times daily as needed for anxiety.     dexamethasone 4 MG tablet  Commonly known as:  DECADRON  Take  4 mg by mouth daily with breakfast.     docusate sodium 100 MG capsule  Commonly known as:  COLACE  Take 100 mg by mouth 2 (two) times daily.     dutasteride 0.5 MG capsule  Commonly known as:  AVODART  Take 0.5 mg by mouth every evening.     furosemide 40 MG tablet  Commonly known as:  LASIX  Take 1 tablet (40 mg total) by mouth 2 (two) times daily.     GERI-TUSSIN 100 MG/5ML syrup  Generic drug:  guaifenesin  Take 300 mg by mouth every 4 (four) hours as needed for cough.  ipratropium-albuterol 0.5-2.5 (3) MG/3ML Soln  Commonly known as:  DUONEB  Take 3 mLs by nebulization every 6 (six) hours.     lactulose 10 GM/15ML solution  Commonly known as:  CHRONULAC  Take 20 g by mouth daily as needed (constipation).     lisinopril 5 MG tablet  Commonly known as:  PRINIVIL,ZESTRIL  Take 1 tablet (5 mg total) by mouth daily.  Start taking on:  11/05/2012     potassium chloride SA 20 MEQ tablet  Commonly known as:  K-DUR,KLOR-CON  Take 1 tablet (20 mEq total) by mouth 2 (two) times daily.     senna-docusate 8.6-50 MG per tablet  Commonly known as:  Senokot-S  Take 1 tablet by mouth 2 (two) times daily.     sertraline 50 MG tablet  Commonly known as:  ZOLOFT  Take 50 mg by mouth every evening.     tamsulosin 0.4 MG Caps  Commonly known as:  FLOMAX  Take 0.4 mg by mouth every 12 (twelve) hours.     traMADol 50 MG tablet  Commonly known as:  ULTRAM  Take 1-2 tablets (50-100 mg total) by mouth every 4 (four) hours as needed for pain.     traZODone 50 MG tablet  Commonly known as:  DESYREL  Take 25 mg by mouth at bedtime.        The patient has been discharged on:   1.Beta Blocker:  Yes [  x ]                              No   [   ]                              If No, reason:  2.Ace Inhibitor/ARB: Yes [   x]                                     No  [    ]                                     If No, reason:  3.Statin:   Yes [ x  ]                  No  [   ]                   If No, reason:  4.Ecasa:  Yes  [  x ]                  No   [   ]                  If No, reason:  Follow Up Appointments: Follow-up Information   Follow up with Tinnie Gens, MD In 2 months. (Call for an appointment regarding further follow up of AAA and right common iliac aneurysms)    Contact information:   [place] [address] [phone]       Follow up with Loralie Champagne, MD. (Call for a follow up appointment for 1 week)    Contact information:   [address] [place]  [place] [address] [phone]       Follow up with HENDRICKSON,STEVEN C, MD. (PA/LAT CXR to be taken (at [facility] which is in the same building as Dr. Claris Gladden office) on 12/03/2012 at 10:30 am;Appointment with Dr. Roxan Hockey is on 12/03/2012 at 11:30 am)    Contact information:   [address] [place] [address] [phone]       Follow up with Urologist. (Please call for an appointment with urologist as soon as possible (foley is place))       Signed: ZIMMERMAN,DONIELLE MPA-C 11/04/2012, 2:41 PM

## 2012-11-01 NOTE — Progress Notes (Signed)
Placed pt. On cpap as per order. Pt. Is tolerating well at this time.

## 2012-11-01 NOTE — Progress Notes (Signed)
Clinical Social Worker met with pt at bedside.  CSW introduced self, explained role, and provided support.  CSW inquired about plan to dc to [facility] at dc; pt confirmed.  CSW to submit clinical information to [facility].  CSW to continue to follow and assist as needed.    Dala Dock, MSW, [place]

## 2012-11-01 NOTE — Progress Notes (Signed)
Pt just walked. Will f/u in am. Yves Dill CES, ACSM 2:17 PM 11/01/2012

## 2012-11-01 NOTE — Progress Notes (Addendum)
                    [address]            [place] [address]          [phone]     7 Days Post-Op Procedure(s) (LRB): CORONARY ARTERY BYPASS GRAFTING (CABG) (N/A) AORTIC VALVE REPLACEMENT (AVR) (N/A) INTRAOPERATIVE TRANSESOPHAGEAL ECHOCARDIOGRAM (N/A)  Subjective: Very concerned about weakness.  Gets SOB when lying flat.  Received IV Lasix last pm with good UOP.   Objective: Vital signs in last 24 hours: Patient Vitals for the past 24 hrs:  BP Temp Temp src Pulse Resp SpO2 Weight  11/01/12 0440 160/80 mmHg 97.6 F (36.4 C) Oral 88 18 94 % 209 lb 7 oz (95 kg)  10/31/12 2210 - - - 96 18 97 % -  10/31/12 2039 - - - - - 97 % -  10/31/12 2005 120/78 mmHg 98.1 F (36.7 C) Oral 94 20 97 % -  10/31/12 1500 - - - - - 96 % -  10/31/12 1254 121/82 mmHg 98.6 F (37 C) Oral 88 20 95 % -  10/31/12 1122 - - - - - 93 % -  10/31/12 0929 118/84 mmHg - - - - - -   Current Weight  11/01/12 209 lb 7 oz (95 kg)   PRE-OPERATIVE WEIGHT: 90 kg   Intake/Output from previous day: 05/01 0701 - 05/02 0700 In: 1200 [P.O.:1200] Out: 3150 [Urine:3150]  CBGs 123-150-95   PHYSICAL EXAM:  Heart: RRR Lungs: Few fine crackles in bases Wound: Clean and dry Extremities: 2+ LE edema, bilateral thigh ecchymosis    Lab Results: CBC: Recent Labs  10/30/12 0500 10/31/12 0525  WBC 5.6 7.8  HGB 8.0* 8.2*  HCT 25.8* 26.6*  PLT 122* 146*   BMET:  Recent Labs  10/30/12 0500 10/31/12 0525  NA 143 144  K 3.9 3.8  CL 107 108  CO2 28 27  GLUCOSE 116* 117*  BUN 24* 24*  CREATININE 0.86 0.77  CALCIUM 9.3 9.3    PT/INR: No results found for this basename: LABPROT, INR,  in the last 72 hours    Assessment/Plan: S/P Procedure(s) (LRB): CORONARY ARTERY BYPASS GRAFTING (CABG) (N/A) AORTIC VALVE REPLACEMENT (AVR) (N/A) INTRAOPERATIVE TRANSESOPHAGEAL ECHOCARDIOGRAM (N/A) CV-BPs improved with  increased beta blocker.  Will watch, may need to titrate further. Continue  ACE-I.  Vol overload- UOP excellent after IV Lasix last night. Currently on Demadex 10 mg daily.  Will increase to home dose and monitor.  Will check BNP. Adrenal insufficiency- continue decadron.  Thrombocytopenia- plts stable. HIT remains pending. Follow labs. Expected postop blood loss anemia- H/H generally stable. Watch.  GU- continue Foley, Flomax, Avodart.  Pulm- aggressive pulm toilet.  Deconditioning- PT/CRPI.  Disp- back to [facility] when medically stable.    LOS: 15 days    COLLINS,GINA H 11/01/2012  Patient seen and examined. Agree with above. Hopefully ready to go back to [facility] on Monday

## 2012-11-02 DIAGNOSIS — I714 Abdominal aortic aneurysm, without rupture, unspecified: Secondary | ICD-10-CM

## 2012-11-02 DIAGNOSIS — I359 Nonrheumatic aortic valve disorder, unspecified: Secondary | ICD-10-CM

## 2012-11-02 LAB — PRO B NATRIURETIC PEPTIDE: Pro B Natriuretic peptide (BNP): 16606 pg/mL — ABNORMAL HIGH (ref 0–125)

## 2012-11-02 LAB — BASIC METABOLIC PANEL
BUN: 26 mg/dL — ABNORMAL HIGH (ref 6–23)
CO2: 31 mEq/L (ref 19–32)
CO2: 29 mEq/L (ref 19–32)
Calcium: 9.4 mg/dL (ref 8.4–10.5)
Chloride: 100 mEq/L (ref 96–112)
Creatinine, Ser: 0.84 mg/dL (ref 0.50–1.35)
GFR calc Af Amer: >90 mL/min (ref >90)
GFR calc non Af Amer: 86 mL/min — ABNORMAL LOW (ref >90)
Glucose, Bld: 95 mg/dL (ref 70–99)
Potassium: 3.6 mEq/L (ref 3.5–5.1)
Potassium: 4.1 mEq/L (ref 3.5–5.1)

## 2012-11-02 LAB — GLUCOSE, CAPILLARY
Glucose-Capillary: 84 mg/dL (ref 70–99)
Glucose-Capillary: 97 mg/dL (ref 70–99)

## 2012-11-02 LAB — CBC
Hemoglobin: 8.8 g/dL — ABNORMAL LOW (ref 13.0–17.0)
MCH: 26.9 pg (ref 26.0–34.0)
MCHC: 30.7 g/dL (ref 30.0–36.0)
MCV: 87.8 fL (ref 78.0–100.0)

## 2012-11-02 MED ORDER — FUROSEMIDE 10 MG/ML IJ SOLN
40.0000 mg | Freq: Two times a day (BID) | INTRAMUSCULAR | Status: DC
  Administered 2012-11-03 – 2012-11-04 (×3): 40 mg via INTRAVENOUS
  Filled 2012-11-02 (×5): qty 4

## 2012-11-02 MED ORDER — POTASSIUM CHLORIDE CRYS ER 20 MEQ PO TBCR
20.0000 meq | EXTENDED_RELEASE_TABLET | Freq: Two times a day (BID) | ORAL | Status: DC
  Administered 2012-11-02 – 2012-11-04 (×5): 20 meq via ORAL
  Filled 2012-11-02 (×5): qty 1

## 2012-11-02 MED ORDER — FUROSEMIDE 10 MG/ML IJ SOLN
40.0000 mg | Freq: Two times a day (BID) | INTRAMUSCULAR | Status: DC
  Administered 2012-11-02: 40 mg via INTRAVENOUS
  Filled 2012-11-02: qty 4

## 2012-11-02 NOTE — Progress Notes (Signed)
RT placed patient on cpap auto 25max and 4 min cmH20 with 2lpm 02 bleed in. Patient is tolerating cpap well at this time.

## 2012-11-02 NOTE — Progress Notes (Signed)
Subjective: Patient complains of SOB with activty.  No signif CP Objective: Filed Vitals:   11/01/12 1946 11/01/12 2220 11/02/12 0500 11/02/12 0900  BP:   131/82   Pulse:  101 84   Temp:   97.7 F (36.5 C)   TempSrc:   Oral   Resp:  20 21   Height:      Weight:   212 lb 1.3 oz (96.2 kg)   SpO2: 98% 97% 90% 93%   Weight change: 2 lb 10.3 oz (1.2 kg)  Intake/Output Summary (Last 24 hours) at 11/02/12 1039 Last data filed at 11/01/12 2035  Gross per 24 hour  Intake    480 ml  Output   2025 ml  Net  -1545 ml    General: Alert, awake, oriented x3, in no acute distress Neck:  JVP is increased Heart: Regular rate and rhythm, without murmurs, rubs, gallops.  Lungs: Rhonchi  Rales at bases.   Exemities:  2+ edema.   Neuro: Grossly intact, nonfocal.  Tele:  SR   Lab Results: Results for orders placed during the hospital encounter of 10/17/12 (from the past 24 hour(s))  GLUCOSE, CAPILLARY     Status: Abnormal   Collection Time    11/01/12 11:25 AM      Result Value Range   Glucose-Capillary 137 (*) 70 - 99 mg/dL   Comment 1 Documented in Chart     Comment 2 Notify RN    GLUCOSE, CAPILLARY     Status: Abnormal   Collection Time    11/01/12  4:34 PM      Result Value Range   Glucose-Capillary 127 (*) 70 - 99 mg/dL   Comment 1 Documented in Chart     Comment 2 Notify RN    GLUCOSE, CAPILLARY     Status: Abnormal   Collection Time    11/01/12  9:37 PM      Result Value Range   Glucose-Capillary 165 (*) 70 - 99 mg/dL   Comment 1 Documented in Chart     Comment 2 Notify RN    BASIC METABOLIC PANEL     Status: Abnormal   Collection Time    11/02/12  5:50 AM      Result Value Range   Sodium 143  135 - 145 mEq/L   Potassium 3.6  3.5 - 5.1 mEq/L   Chloride 104  96 - 112 mEq/L   CO2 31  19 - 32 mEq/L   Glucose, Bld 95  70 - 99 mg/dL   BUN 26 (*) 6 - 23 mg/dL   Creatinine, Ser 0.86  0.50 - 1.35 mg/dL   Calcium 9.4  8.4 - 10.5 mg/dL   GFR calc non Af Amer 86 (*) >90 mL/min    GFR calc Af Amer >90  >90 mL/min  CBC     Status: Abnormal   Collection Time    11/02/12  5:50 AM      Result Value Range   WBC 10.8 (*) 4.0 - 10.5 K/uL   RBC 3.27 (*) 4.22 - 5.81 MIL/uL   Hemoglobin 8.8 (*) 13.0 - 17.0 g/dL   HCT 28.7 (*) 39.0 - 52.0 %   MCV 87.8  78.0 - 100.0 fL   MCH 26.9  26.0 - 34.0 pg   MCHC 30.7  30.0 - 36.0 g/dL   RDW 18.6 (*) 11.5 - 15.5 %   Platelets 198  150 - 400 K/uL  PRO B NATRIURETIC PEPTIDE     Status:  Abnormal   Collection Time    11/02/12  5:50 AM      Result Value Range   Pro B Natriuretic peptide (BNP) 16606.0 (*) 0 - 125 pg/mL  GLUCOSE, CAPILLARY     Status: None   Collection Time    11/02/12  6:18 AM      Result Value Range   Glucose-Capillary 84  70 - 99 mg/dL    Studies/Results: @RISRSLT24 @  Medications:  Reviewed   @PROBHOSP @  1.  CAD  8 days post CABG with AVR.  Volume increased on exam.  Review of orders he received lasix once on 5/1. I would give BID today and follow output, renal function  2.  S/p AVR  3.  HL  ON lipitor 80  LOS: 16 days   Dorris Carnes 11/02/2012, 10:39 AM

## 2012-11-02 NOTE — Progress Notes (Signed)
Pt ambulated 250 ft with RW, O2 at 3L, and 2 assists.  1 sitting rest and 1 standing rest.  Will con't plan of care.

## 2012-11-02 NOTE — Progress Notes (Addendum)
                    [address]            [place] [address]          [phone]     8 Days Post-Op Procedure(s) (LRB): CORONARY ARTERY BYPASS GRAFTING (CABG) (N/A) AORTIC VALVE REPLACEMENT (AVR) (N/A) INTRAOPERATIVE TRANSESOPHAGEAL ECHOCARDIOGRAM (N/A)  Subjective: Stable night, no complaints.  Breathing is a little better today.   Objective: Vital signs in last 24 hours: Patient Vitals for the past 24 hrs:  BP Temp Temp src Pulse Resp SpO2 Weight  11/02/12 0500 131/82 mmHg 97.7 F (36.5 C) Oral 84 21 90 % 212 lb 1.3 oz (96.2 kg)  11/01/12 2220 - - - 101 20 97 % -  11/01/12 1946 - - - - - 98 % -  11/01/12 1942 109/71 mmHg 98.4 F (36.9 C) Oral 96 20 97 % -  11/01/12 1421 114/77 mmHg 98 F (36.7 C) Oral 90 20 100 % -  11/01/12 1046 117/69 mmHg - - - - - -   Current Weight  11/02/12 212 lb 1.3 oz (96.2 kg)  PRE-OPERATIVE WEIGHT: 90 kg    Intake/Output from previous day: 05/02 0701 - 05/03 0700 In: 720 [P.O.:720] Out: 2650 [Urine:2650]  CBGs 127-165-95-84    PHYSICAL EXAM:  Heart: RRR Lungs: Fine crackles in bases Wound: Clean and dry Extremities: Stable 2+ LE edema    Lab Results: CBC: Recent Labs  10/31/12 0525 11/02/12 0550  WBC 7.8 10.8*  HGB 8.2* 8.8*  HCT 26.6* 28.7*  PLT 146* 198   BMET:  Recent Labs  10/31/12 0525 11/02/12 0550  NA 144 143  K 3.8 3.6  CL 108 104  CO2 27 31  GLUCOSE 117* 95  BUN 24* 26*  CREATININE 0.77 0.86  CALCIUM 9.3 9.4    PT/INR: No results found for this basename: LABPROT, INR,  in the last 72 hours  Pro BNP=  16606  Assessment/Plan: S/P Procedure(s) (LRB): CORONARY ARTERY BYPASS GRAFTING (CABG) (N/A) AORTIC VALVE REPLACEMENT (AVR) (N/A) INTRAOPERATIVE TRANSESOPHAGEAL ECHOCARDIOGRAM (N/A) CV-BPs stable.  Continue beta blocker, ACE-I. Vol overload- UOP excellent.  Back on home dose of Demadex.  BNP elevated. Continue diuresis and watch. Hypokalemia- replace K+. Adrenal  insufficiency- continue decadron.  Thrombocytopenia- plts stable. Still no result from HIT panel.  Will check to make sure this was performed, as he will be having AAA repair in the future. Expected postop blood loss anemia- H/H stable. GU- continue Foley, Flomax, Avodart.  Pulm- aggressive pulm toilet.  Deconditioning- PT/CRPI.  Disp- back to [facility] when medically stable. Pt is concerned about taking Zoloft.  He states he never took it before and is not depressed.  It was on his home med list on admission.  Will d/w MD- may need to taper if this is to be d/c'ed.    LOS: 16 days    COLLINS,GINA H 11/02/2012  D/c zoloft Walking in hall but dyspenic with exertion, I have seen and examined Rodman Key and agree with the above assessment  and plan.  Grace Isaac MD Beeper [phone] Office [phone] 11/02/2012 11:06 AM

## 2012-11-02 NOTE — Progress Notes (Signed)
Pt attempted to ambulate for the third time today but could only walk few steps with a rolling walker from room to the Nursing station (about 20 ft) with frequent breaks  to catch his breath. Pt back in room and resting in bed, call bell placed within reach. Will continue plan of care.

## 2012-11-02 NOTE — Progress Notes (Signed)
Pt ambulated in hallway 150 feet pushing wheelchair on 3 L O2 with standby assist. Pt stopped 4 times during the walk to catch his breath. Overall, tolerated it well.  Vella Raring, RN

## 2012-11-02 NOTE — Progress Notes (Signed)
CARDIAC REHAB PHASE I   PRE:  Rate/Rhythm: 96SR  BP:  Supine:   Sitting: 126/70  Standing:    SaO2: 95%3L  MODE:  Ambulation: 175 ft   POST:  Rate/Rhythm: 109ST  BP:  Supine:   Sitting: 144/68  Standing:    SaO2: 81%3L to 95%3L 1032-1059 Pt ambulated fairly well with walker, assistance x1, and 3L supp O2. Pt took 3 standing rest breaks, and worked with pt on pursed lip breathing. Pt's sats dropped to 81% during walk, and came back up to 95% with 45 seconds of rest. Pt would like to have supp O2 at home. Returned pt to side of bed and ready to bathe.  Lollie Marrow

## 2012-11-03 LAB — GLUCOSE, CAPILLARY
Glucose-Capillary: 128 mg/dL — ABNORMAL HIGH (ref 70–99)
Glucose-Capillary: 98 mg/dL (ref 70–99)
Glucose-Capillary: 130 mg/dL — ABNORMAL HIGH (ref 70–99)

## 2012-11-03 LAB — PRO B NATRIURETIC PEPTIDE: Pro B Natriuretic peptide (BNP): 11508 pg/mL — ABNORMAL HIGH (ref 0–125)

## 2012-11-03 MED ORDER — ALBUTEROL SULFATE (5 MG/ML) 0.5% IN NEBU
2.5000 mg | INHALATION_SOLUTION | Freq: Three times a day (TID) | RESPIRATORY_TRACT | Status: DC
  Administered 2012-11-03 – 2012-11-04 (×5): 2.5 mg via RESPIRATORY_TRACT
  Filled 2012-11-03 (×6): qty 0.5

## 2012-11-03 MED ORDER — IPRATROPIUM BROMIDE 0.02 % IN SOLN
0.5000 mg | Freq: Three times a day (TID) | RESPIRATORY_TRACT | Status: DC
  Administered 2012-11-03 – 2012-11-04 (×5): 0.5 mg via RESPIRATORY_TRACT
  Filled 2012-11-03 (×6): qty 2.5

## 2012-11-03 NOTE — Progress Notes (Signed)
Pt ambulated 250 ft with O2 at 3L.  Slow pace, 3 brief standing rests.  SPO2 92-100% on return to room, O2 left at 2L. Wife at bedside.  Will cont plan of care.

## 2012-11-03 NOTE — Progress Notes (Signed)
Subjective: Patient is still SOB with walking   Objective: Filed Vitals:   11/02/12 2028 11/02/12 2244 11/03/12 0446 11/03/12 0733  BP: 111/81  112/79   Pulse: 100 102 84   Temp: 98.1 F (36.7 C)  97.3 F (36.3 C)   TempSrc: Oral  Oral   Resp: 21  19   Height:      Weight:   202 lb 6.4 oz (91.808 kg)   SpO2: 100%  98% 98%   Weight change: -9 lb 10.9 oz (-4.392 kg)  Intake/Output Summary (Last 24 hours) at 11/03/12 0851 Last data filed at 11/03/12 0447  Gross per 24 hour  Intake    440 ml  Output   7700 ml  Net  -7260 ml    General: Alert, awake, oriented x3, in no acute distress Neck:  JVP is increased Heart: Regular rate and rhythm, without murmurs, rubs, gallops.  Lungs: Clear to auscultation.  Rales at bases Exemities:  2+ edema.   Neuro: Grossly intact, nonfocal.  Tele:  SR  Lab Results: Results for orders placed during the hospital encounter of 10/17/12 (from the past 24 hour(s))  GLUCOSE, CAPILLARY     Status: None   Collection Time    11/02/12 11:25 AM      Result Value Range   Glucose-Capillary 97  70 - 99 mg/dL   Comment 1 Notify RN     Comment 2 Documented in Chart    BASIC METABOLIC PANEL     Status: Abnormal   Collection Time    11/02/12  3:35 PM      Result Value Range   Sodium 138  135 - 145 mEq/L   Potassium 4.1  3.5 - 5.1 mEq/L   Chloride 100  96 - 112 mEq/L   CO2 29  19 - 32 mEq/L   Glucose, Bld 184 (*) 70 - 99 mg/dL   BUN 26 (*) 6 - 23 mg/dL   Creatinine, Ser 0.84  0.50 - 1.35 mg/dL   Calcium 9.0  8.4 - 10.5 mg/dL   GFR calc non Af Amer 87 (*) >90 mL/min   GFR calc Af Amer >90  >90 mL/min  GLUCOSE, CAPILLARY     Status: Abnormal   Collection Time    11/02/12  4:01 PM      Result Value Range   Glucose-Capillary 163 (*) 70 - 99 mg/dL   Comment 1 Notify RN     Comment 2 Documented in Chart    GLUCOSE, CAPILLARY     Status: Abnormal   Collection Time    11/02/12  8:58 PM      Result Value Range   Glucose-Capillary 156 (*) 70 - 99  mg/dL   Comment 1 Documented in Chart     Comment 2 Notify RN    PRO B NATRIURETIC PEPTIDE     Status: Abnormal   Collection Time    11/03/12  4:00 AM      Result Value Range   Pro B Natriuretic peptide (BNP) 11508.0 (*) 0 - 125 pg/mL  GLUCOSE, CAPILLARY     Status: Abnormal   Collection Time    11/03/12  6:32 AM      Result Value Range   Glucose-Capillary 128 (*) 70 - 99 mg/dL   Comment 1 Documented in Chart     Comment 2 Notify RN      Studies/Results: @RISRSLT24 @  Medications: REviewed   @PROBHOSP @  1.  CAD. Patient is 9 days post op  CABG and AVR.  DIuresed yesterday with addition of lasix.  Still with significant volume increase on exam.  He continues to desat with walking.   I would continue with IV lasix  Follow exam, BNP On oral demedex.  Probably not getting absorbed fully with edema.  I would keep on for now.  2.  HL  COntinue lipitor 80.    LOS: 17 days   Dorris Carnes 11/03/2012, 8:51 AM

## 2012-11-03 NOTE — Progress Notes (Addendum)
                    [address]            [place] [address]          [phone]     9 Days Post-Op Procedure(s) (LRB): CORONARY ARTERY BYPASS GRAFTING (CABG) (N/A) AORTIC VALVE REPLACEMENT (AVR) (N/A) INTRAOPERATIVE TRANSESOPHAGEAL ECHOCARDIOGRAM (N/A)  Subjective: Feels a little better today.  Not as SOB as he was yesterday.   Objective: Vital signs in last 24 hours: Patient Vitals for the past 24 hrs:  BP Temp Temp src Pulse Resp SpO2 Weight  11/03/12 0733 - - - - - 98 % -  11/03/12 0446 112/79 mmHg 97.3 F (36.3 C) Oral 84 19 98 % 202 lb 6.4 oz (91.808 kg)  11/02/12 2244 - - - 102 - - -  11/02/12 2028 111/81 mmHg 98.1 F (36.7 C) Oral 100 21 100 % -  11/02/12 1912 - - - - - 97 % -  11/02/12 1319 94/63 mmHg 97.3 F (36.3 C) Oral 76 20 98 % -   Current Weight  11/03/12 202 lb 6.4 oz (91.808 kg)  PRE-OPERATIVE WEIGHT: 90 kg    Intake/Output from previous day: 05/03 0701 - 05/04 0700 In: 440 [P.O.:440] Out: 7700 [Urine:7700]  CBGs 184-163-156-128   PHYSICAL EXAM:  Heart: RRR Lungs: Bilateral rales Wound: Clean and dry, mild erythema/ecchymosis of thigh incisions Extremities: 2+ LE edema    Lab Results: CBC: Recent Labs  11/02/12 0550  WBC 10.8*  HGB 8.8*  HCT 28.7*  PLT 198   BMET:  Recent Labs  11/02/12 0550 11/02/12 1535  NA 143 138  K 3.6 4.1  CL 104 100  CO2 31 29  GLUCOSE 95 184*  BUN 26* 26*  CREATININE 0.86 0.84  CALCIUM 9.4 9.0    PT/INR: No results found for this basename: LABPROT, INR,  in the last 72 hours  Pro BNP= 11508  Assessment/Plan: S/P Procedure(s) (LRB): CORONARY ARTERY BYPASS GRAFTING (CABG) (N/A) AORTIC VALVE REPLACEMENT (AVR) (N/A) INTRAOPERATIVE TRANSESOPHAGEAL ECHOCARDIOGRAM (N/A)  CV-SR, BPs stable. Continue beta blocker, ACE-I.   Vol overload- Diuresed 7L yesterday. Wt, BNP trending down.  Still significantly edematous.  IV Lasix ordered again today.  Continue po Demadex.  Adrenal  insufficiency- continue decadron.   Thrombocytopenia- plts stable. HIT panel ordered twice, still not resulted. Will check to make sure this was performed, as he will be having AAA repair in the future.   Expected postop blood loss anemia- H/H stable.   GU- continue Foley, Flomax, Avodart.   Pulm- aggressive pulm toilet. Deconditioning- PT/CRPI.   Disp- back to [facility] when medically stable, ?in am.    LOS: 17 days    COLLINS,GINA H 11/03/2012  Still no results for HIT test, plt have increased I have seen and examined Rodman Key and agree with the above assessment  and plan.  Grace Isaac MD Beeper [phone] Office [phone] 11/03/2012 11:13 AM

## 2012-11-03 NOTE — Progress Notes (Signed)
Pt ambulated 250 ft with 5 standing rests.  To recliner after walk with call bell in reach.   Will con't plan of care.

## 2012-11-03 NOTE — Progress Notes (Signed)
EPWs and Ct sutures removed per order and unit protocol.  Sites painted and steri's applied.  Pt tolerated very well.  Understands bedrest for one hour, HOB at 30 degrees, wife at bedside.  VSS see flow sheet.  CCMD notified, will monitor closely.

## 2012-11-03 NOTE — Progress Notes (Signed)
Pt ambulated 250 ft with RW and O2 at 3L.  3 standing and 1 sitting rest stop.  Significantly more fatigued and SOB this walk; however SPO2 99% on 2L on return to room.  Wife at bedside, will con't plan of care.

## 2012-11-03 NOTE — Progress Notes (Signed)
RT placed patient on cpap auto 20cmH20 max and 4cmH20 min with 2lpm 02 bleed in. Patient is tolerating cpap well at this time. RT will continue to monitor.

## 2012-11-04 DIAGNOSIS — I2510 Atherosclerotic heart disease of native coronary artery without angina pectoris: Secondary | ICD-10-CM

## 2012-11-04 DIAGNOSIS — I5021 Acute systolic (congestive) heart failure: Principal | ICD-10-CM

## 2012-11-04 LAB — BASIC METABOLIC PANEL
BUN: 35 mg/dL — ABNORMAL HIGH (ref 6–23)
CO2: 33 mEq/L — ABNORMAL HIGH (ref 19–32)
Chloride: 100 mEq/L (ref 96–112)
Creatinine, Ser: 0.86 mg/dL (ref 0.50–1.35)
Glucose, Bld: 171 mg/dL — ABNORMAL HIGH (ref 70–99)
Potassium: 3.6 mEq/L (ref 3.5–5.1)

## 2012-11-04 LAB — HEPARIN INDUCED THROMBOCYTOPENIA PNL
Heparin Induced Plt Ab: NEGATIVE
UFH High Dose UFH H: 0 % Release
UFH Low Dose 0.1 IU/mL: 0 % Release
UFH Low Dose 0.5 IU/mL: 0 % Release

## 2012-11-04 LAB — GLUCOSE, CAPILLARY: Glucose-Capillary: 105 mg/dL — ABNORMAL HIGH (ref 70–99)

## 2012-11-04 MED ORDER — CARVEDILOL 3.125 MG PO TABS
3.1250 mg | ORAL_TABLET | Freq: Two times a day (BID) | ORAL | Status: DC
  Filled 2012-11-04 (×2): qty 1

## 2012-11-04 MED ORDER — POTASSIUM CHLORIDE CRYS ER 20 MEQ PO TBCR: 20.0000 meq | EXTENDED_RELEASE_TABLET | Freq: Two times a day (BID) | ORAL | Status: DC

## 2012-11-04 MED ORDER — CARVEDILOL 3.125 MG PO TABS: 3.1250 mg | ORAL_TABLET | Freq: Two times a day (BID) | ORAL | Status: DC

## 2012-11-04 MED ORDER — FUROSEMIDE 40 MG PO TABS: 40.0000 mg | ORAL_TABLET | Freq: Two times a day (BID) | ORAL | Status: DC

## 2012-11-04 MED ORDER — LISINOPRIL 5 MG PO TABS: 5.0000 mg | ORAL_TABLET | Freq: Every day | ORAL | Status: DC

## 2012-11-04 MED ORDER — TRAMADOL HCL 50 MG PO TABS: 50.0000 mg | ORAL_TABLET | ORAL | Status: DC | PRN

## 2012-11-04 MED ORDER — POTASSIUM CHLORIDE CRYS ER 10 MEQ PO TBCR: 10.0000 meq | EXTENDED_RELEASE_TABLET | Freq: Two times a day (BID) | ORAL | Status: DC

## 2012-11-04 MED ORDER — ATORVASTATIN CALCIUM 80 MG PO TABS: 80.0000 mg | ORAL_TABLET | Freq: Every day | ORAL | Status: DC

## 2012-11-04 MED ORDER — ASPIRIN 325 MG PO TBEC: 325.0000 mg | DELAYED_RELEASE_TABLET | Freq: Every day | ORAL | Status: DC

## 2012-11-04 NOTE — Progress Notes (Signed)
Patient ID: [name], male   DOB: 04-02-1941, 72 y.o.   MRN: [id number]    SUBJECTIVE: Good diuresis yesterday. Breathing better on walk this morning but still some dyspnea. BP soft but no lightheadedness.   Marland Kitchen albuterol  2.5 mg Nebulization TID  . aspirin EC  325 mg Oral Daily   Or  . aspirin  324 mg Per Tube Daily  . atorvastatin  80 mg Oral Daily  . bisacodyl  10 mg Oral Daily   Or  . bisacodyl  10 mg Rectal Daily  . dexamethasone  4 mg Oral Daily  . docusate sodium  200 mg Oral Daily  . dutasteride  0.5 mg Oral QPM  . furosemide  40 mg Intravenous Q12H  . guaiFENesin  600 mg Oral BID  . insulin aspart  0-24 Units Subcutaneous TID AC & HS  . ipratropium  0.5 mg Nebulization TID  . [START ON 11/05/2012] lisinopril  5 mg Oral Daily  . metoprolol tartrate  50 mg Oral BID  . mometasone-formoterol  2 puff Inhalation BID  . pantoprazole  40 mg Oral Daily  . potassium chloride  20 mEq Oral BID  . sodium chloride  10-40 mL Intracatheter Q12H  . sodium chloride  3 mL Intravenous Q12H  . tamsulosin  0.4 mg Oral Q12H  . traZODone  25 mg Oral QHS      Filed Vitals:   11/03/12 2202 11/03/12 2310 11/04/12 0520 11/04/12 0625  BP: 95/57  80/62 92/64  Pulse: 98 95 83   Temp:   98.8 F (37.1 C)   TempSrc:   Oral   Resp:   18   Height:      Weight:   192 lb 14.4 oz (87.499 kg)   SpO2:   100%     Intake/Output Summary (Last 24 hours) at 11/04/12 0832 Last data filed at 11/04/12 0830  Gross per 24 hour  Intake     10 ml  Output   6300 ml  Net  -6290 ml    LABS: Basic Metabolic Panel:  Recent Labs  11/02/12 0550 11/02/12 1535  NA 143 138  K 3.6 4.1  CL 104 100  CO2 31 29  GLUCOSE 95 184*  BUN 26* 26*  CREATININE 0.86 0.84  CALCIUM 9.4 9.0   Liver Function Tests: No results found for this basename: AST, ALT, ALKPHOS, BILITOT, PROT, ALBUMIN,  in the last 72 hours No results found for this basename: LIPASE, AMYLASE,  in the last 72 hours CBC:  Recent Labs  11/02/12 0550  WBC 10.8*  HGB 8.8*  HCT 28.7*  MCV 87.8  PLT 198   Cardiac Enzymes: No results found for this basename: CKTOTAL, CKMB, CKMBINDEX, TROPONINI,  in the last 72 hours BNP: No components found with this basename: POCBNP,  D-Dimer: No results found for this basename: DDIMER,  in the last 72 hours Hemoglobin A1C: No results found for this basename: HGBA1C,  in the last 72 hours Fasting Lipid Panel: No results found for this basename: CHOL, HDL, LDLCALC, TRIG, CHOLHDL, LDLDIRECT,  in the last 72 hours Thyroid Function Tests: No results found for this basename: TSH, T4TOTAL, FREET3, T3FREE, THYROIDAB,  in the last 72 hours Anemia Panel: No results found for this basename: VITAMINB12, FOLATE, FERRITIN, TIBC, IRON, RETICCTPCT,  in the last 72 hours  RADIOLOGY: Dg Chest 2 View  10/18/2012  *RADIOLOGY REPORT*  Clinical Data: Shortness of breath, cough and congestion.  CHEST - 2 VIEW  Comparison: 10/17/2012 and CT chest 08/11/2012.  Findings: Trachea is midline.  Heart is at the upper limits of normal in size.  There is diffuse mixed interstitial and airspace disease with linear scarring at the left lung base.  Small bilateral pleural effusions.  Findings are superimposed on emphysema.  IMPRESSION: Persistent congestive heart failure, possibly minimally improved from 10/17/2012.   Original Report Authenticated By: Lorin Picket, M.D.     PHYSICAL EXAM General: NAD Neck: JVP 8-9 cm, no thyromegaly or thyroid nodule.  Lungs: Decreased BS at bases.  CV: Nondisplaced PMI.  Heart regular S1/S2, no S3/S4, 1/6 SEM.  2+ edema to knees bilaterally Abdomen: Soft, nontender, no hepatosplenomegaly, no distention.  Neurologic: Alert and oriented x 3.  Psych: Normal affect. Extremities: No clubbing or cyanosis.   TELEMETRY: Reviewed telemetry pt in NSR  ASSESSMENT AND PLAN: 72 yo with history of recent severe illness involving respiratory failure and tracheostomy as well as DM, HTN, and  COPD presented to the ER at [facility] with acute systolic CHF and was transferred up to [facility].  LHC showed severe 3VD, there was moderate to severe AS.  Now s/p CABG-AVR.  1. CHF: EF 35-40% pre-op, limited echo post-op suggests improvement.  He remains volume overloaded on exam.  He diuresed well yesterday.  - Continue IV Lasix today, would like to see more volume off.  Awaiting labs this morning.  - Would stop po torsemide while on IV Lasix.  He can likely go home on po Lasix.  - Stop metoprolol, replace with low dose Coreg . - Can hold lisinopril today with soft BP, would try to get him back on it tomorrow.  2. CAD: Severe 3 vessel disease, s/p CABG.  Continue statin/ASA.  3. Aortic stenosis: Moderate to severe AS, s/p bioprosthetic AVR.  4. 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  Seen by [facility], not stent graft candidate.  Plan AAA/iliac aneurysm repair after he has recovered from CABG-AVR. 5. Pulmonary: PFTs suggests moderate to severe impairment with mixed obstructive (COPD) and restrictive (post-ARDs) picture.    Loralie Champagne 11/04/2012

## 2012-11-04 NOTE — Clinical Social Work Note (Addendum)
Clinical Social Worker called Timmie Foerster, admissions coordinator at [facility] regarding discharge today. Per Ivin Booty, she was under the impression that the patient was discharging home and did not see the clinicals that were sent 11-01-12 because she was off. Ivin Booty will review information and will need prior approval from [facility] before patient can be transferred. CSW will continue to follow and facilitate discharge when CSW receives response back from facility.   13:34pm CSW attempted to speak with patient's wife, but was not able to leave voice message due to "mailbox being full." CSW attempted to reach daughter, [name], but had to leave a voice message. CSW was able to reach daughter, [name] and she reported that patient is still planned for discharge to [facility] and she planned on transporting him to facility. CSW received a voice message from facility regarding them negotiating rates with patient's insurance company.   14:33pm CSW spoke with facility and they received approval for SNF placement for patient. CSW spoke with daughter and she will be on her way to transport patient to facility. Daughter will stop by facility to pick up oxygen tank for patient. CSW will leave discharge packet in patient's room. CSW will sign off, as social work intervention is no longer needed.   [name] MSW, [place]

## 2012-11-04 NOTE — Progress Notes (Signed)
PT Cancellation Note  Patient Details Name: [name] MRN: [id number] DOB: 1941/05/07   Cancelled Treatment:    Reason Eval/Treat Not Completed: Other (comment) (Pt walked earlier and is resting. Will follow up later.)   Oleva Koo 11/04/2012, 9:16 AM

## 2012-11-04 NOTE — Progress Notes (Signed)
NUTRITION FOLLOW UP  Intervention:    No nutrition intervention at this time  RD to continue to follow  Nutrition Dx:   Unintentional weight loss related to recent illness as evidenced by weight trends, ongoing  Goal:   Oral intake with meals to meet >/= 90% of estimated nutrition needs, met  Monitor:   PO intake, weight, labs, I/O's  Assessment:   Patient s/p CABG 4/25.  PO intake 100% per flowsheet records.  Disposition: [facility] facility.  Height: Ht Readings from Last 1 Encounters:  10/25/12 5\' 6"  (1.676 m)    Weight Status:   Wt Readings from Last 1 Encounters:  11/04/12 192 lb 14.4 oz (87.499 kg)    Re-estimated needs:  Kcal: 1800-2000 Protein: 80-90 gm Fluid: 1.8-2.0 L  Skin: Intact  Diet Order: Carb Control   Intake/Output Summary (Last 24 hours) at 11/04/12 1206 Last data filed at 11/04/12 0830  Gross per 24 hour  Intake    250 ml  Output   5000 ml  Net  -4750 ml    Last BM: 5/4  Labs:   Recent Labs Lab 11/02/12 0550 11/02/12 1535 11/04/12 0830  NA 143 138 141  K 3.6 4.1 3.6  CL 104 100 100  CO2 31 29 33*  BUN 26* 26* 35*  CREATININE 0.86 0.84 0.86  CALCIUM 9.4 9.0 9.4  GLUCOSE 95 184* 171*    CBG (last 3)   Recent Labs  11/03/12 2106 11/04/12 0612 11/04/12 1142  GLUCAP 130* 105* 119*    Scheduled Meds: . albuterol  2.5 mg Nebulization TID  . aspirin EC  325 mg Oral Daily   Or  . aspirin  324 mg Per Tube Daily  . atorvastatin  80 mg Oral Daily  . bisacodyl  10 mg Oral Daily   Or  . bisacodyl  10 mg Rectal Daily  . carvedilol  3.125 mg Oral BID WC  . dexamethasone  4 mg Oral Daily  . docusate sodium  200 mg Oral Daily  . dutasteride  0.5 mg Oral QPM  . furosemide  40 mg Intravenous Q12H  . guaiFENesin  600 mg Oral BID  . insulin aspart  0-24 Units Subcutaneous TID AC & HS  . ipratropium  0.5 mg Nebulization TID  . [START ON 11/05/2012] lisinopril  5 mg Oral Daily  . mometasone-formoterol  2 puff Inhalation  BID  . pantoprazole  40 mg Oral Daily  . potassium chloride  20 mEq Oral BID  . sodium chloride  10-40 mL Intracatheter Q12H  . sodium chloride  3 mL Intravenous Q12H  . tamsulosin  0.4 mg Oral Q12H  . traZODone  25 mg Oral QHS    Continuous Infusions:   Arthur Holms, RD, LDN Pager #: [phone] After-Hours Pager #: [phone]

## 2012-11-04 NOTE — Progress Notes (Signed)
Ed completed. Pt voiced understanding and requests his name be sent to [facility]. Will send. To watch video after lunch. [id number] [place], ACSM 11:46 AM  11/04/2012

## 2012-11-04 NOTE — Progress Notes (Addendum)
                   [address]            [place] [address]          [phone]      10 Days Post-Op Procedure(s) (LRB): CORONARY ARTERY BYPASS GRAFTING (CABG) (N/A) AORTIC VALVE REPLACEMENT (AVR) (N/A) INTRAOPERATIVE TRANSESOPHAGEAL ECHOCARDIOGRAM (N/A)  Subjective: Patient eating breakfast. Has less shortness of breath this am.  Objective: Vital signs in last 24 hours: Temp:  [97.3 F (36.3 C)-98.8 F (37.1 C)] 98.8 F (37.1 C) (05/05 0520) Pulse Rate:  [83-98] 83 (05/05 0520) Cardiac Rhythm:  [-] Normal sinus rhythm (05/04 2226) Resp:  [18-20] 18 (05/05 0520) BP: (80-111)/(57-74) 92/64 mmHg (05/05 0625) SpO2:  [94 %-100 %] 100 % (05/05 0520) Weight:  [87.499 kg (192 lb 14.4 oz)] 87.499 kg (192 lb 14.4 oz) (05/05 0520)  Pre op weight  90 kg Current Weight  11/04/12 87.499 kg (192 lb 14.4 oz)      Intake/Output from previous day: 05/04 0701 - 05/05 0700 In: -  Out: 6300 [Urine:6300]   Physical Exam:  Cardiovascular: RRR Pulmonary: Rales but no wheezes, or rhonchi. Abdomen: Soft, non tender, bowel sounds present. Extremities: 2+ bilateral lower extremity edema. Wounds: Clean and dry.  No erythema or signs of infection.  Lab Results: CBC: Recent Labs  11/02/12 0550  WBC 10.8*  HGB 8.8*  HCT 28.7*  PLT 198   BMET:  Recent Labs  11/02/12 0550 11/02/12 1535  NA 143 138  K 3.6 4.1  CL 104 100  CO2 31 29  GLUCOSE 95 184*  BUN 26* 26*  CREATININE 0.86 0.84  CALCIUM 9.4 9.0    PT/INR:  Lab Results  Component Value Date   INR 1.48 10/25/2012   INR 0.95 10/24/2012   INR 1.14 08/05/2012   ABG:  INR: Will add last result for INR, ABG once components are confirmed Will add last 4 CBG results once components are confirmed  Assessment/Plan:  1. CV - SR. On Lopressor 50 bid and Lisinopril 5 daily. Will hold Lisinopril this am as SBP in the 90's. 2.  Pulmonary - On 2 liters of oxygen via .Encourage incentive spirometer 3.Volume  overload-BNP yesterday was down to 115[address].Has been give IV Lasix over the weekend and po Demadex with good diuresis 4.  Acute blood loss anemia - Last H and H 8.8 and 28.7 5.Thrrombocytopenia resolved-last platelets up to 198,000.   HIT ordered twice. Result is pending. 6.GU-foley remains. Continue Flomax and Avodart. Will need follow up with urologist. 7.CBGs 177/130/105.Pre op HGA1C 5.5. Likely pre diabetic. Will require follow up after discharge 8.Will discuss with surgeon when ready for [facility]  ZIMMERMAN,DONIELLE MPA-C 11/04/2012,7:20 AM  He is ready for dc. Medications can be adjusted as outpatient. When a bed is ready will dc to [facility] He is severely deconditioned and has bad COPD, post ARDS pulmonary disease- he will benefit from O2

## 2012-11-05 NOTE — Discharge Summary (Signed)
HIT panel still pending at time of discharge

## 2012-11-06 LAB — HEPARIN INDUCED THROMBOCYTOPENIA PNL
UFH High Dose UFH H: 8 % Release
UFH Low Dose 0.1 IU/mL: 7 % Release
UFH Low Dose 0.5 IU/mL: 14 % Release

## 2012-11-12 ENCOUNTER — Telehealth: Payer: Self-pay | Admitting: Cardiology

## 2012-11-12 NOTE — Telephone Encounter (Signed)
That would be fine 

## 2012-11-12 NOTE — Telephone Encounter (Signed)
Will forward to Dr [name] to review

## 2012-11-12 NOTE — Telephone Encounter (Signed)
New Problem:    Patient called in wanting to know if Dr. [name] would refer him to have a handicap tag for his car.  Please call back.

## 2012-11-13 NOTE — Telephone Encounter (Signed)
Pt. Is aware that he will be call when Dr. [name] sign the handicap form, and that  Dr. [name] will be in the office on Monday 11/18/12.

## 2012-11-14 ENCOUNTER — Encounter: Payer: Medicare Other | Admitting: Physician Assistant

## 2012-11-18 ENCOUNTER — Ambulatory Visit (INDEPENDENT_AMBULATORY_CARE_PROVIDER_SITE_OTHER): Payer: BC Managed Care – PPO | Admitting: Cardiology

## 2012-11-18 ENCOUNTER — Encounter: Payer: Self-pay | Admitting: Cardiology

## 2012-11-18 VITALS — BP 110/62 | HR 94 | Ht 66.0 in | Wt 195.0 lb

## 2012-11-18 DIAGNOSIS — R0989 Other specified symptoms and signs involving the circulatory and respiratory systems: Secondary | ICD-10-CM

## 2012-11-18 DIAGNOSIS — I5022 Chronic systolic (congestive) heart failure: Secondary | ICD-10-CM

## 2012-11-18 DIAGNOSIS — I714 Abdominal aortic aneurysm, without rupture, unspecified: Secondary | ICD-10-CM

## 2012-11-18 DIAGNOSIS — I350 Nonrheumatic aortic (valve) stenosis: Secondary | ICD-10-CM

## 2012-11-18 DIAGNOSIS — I255 Ischemic cardiomyopathy: Secondary | ICD-10-CM

## 2012-11-18 DIAGNOSIS — I2510 Atherosclerotic heart disease of native coronary artery without angina pectoris: Secondary | ICD-10-CM

## 2012-11-18 DIAGNOSIS — I2589 Other forms of chronic ischemic heart disease: Secondary | ICD-10-CM

## 2012-11-18 DIAGNOSIS — I359 Nonrheumatic aortic valve disorder, unspecified: Secondary | ICD-10-CM

## 2012-11-18 LAB — BASIC METABOLIC PANEL
BUN: 46 mg/dL — ABNORMAL HIGH (ref 6–23)
CO2: 30 mEq/L (ref 19–32)
Calcium: 9.2 mg/dL (ref 8.4–10.5)
GFR: 71.71 mL/min (ref >60.00)
Glucose, Bld: 210 mg/dL — ABNORMAL HIGH (ref 70–99)
Sodium: 140 mEq/L (ref 135–145)

## 2012-11-18 MED ORDER — ATORVASTATIN CALCIUM 80 MG PO TABS: 80.0000 mg | ORAL_TABLET | Freq: Every day | ORAL | Status: DC

## 2012-11-18 MED ORDER — LISINOPRIL 5 MG PO TABS: 5.0000 mg | ORAL_TABLET | Freq: Every day | ORAL | Status: DC

## 2012-11-18 MED ORDER — TORSEMIDE 20 MG PO TABS: ORAL_TABLET | ORAL | Status: DC

## 2012-11-18 MED ORDER — POTASSIUM CHLORIDE ER 10 MEQ PO TBCR: 10.0000 meq | EXTENDED_RELEASE_TABLET | Freq: Every day | ORAL | Status: DC

## 2012-11-18 MED ORDER — CARVEDILOL 6.25 MG PO TABS: 6.2500 mg | ORAL_TABLET | Freq: Two times a day (BID) | ORAL | Status: DC

## 2012-11-18 NOTE — Patient Instructions (Addendum)
Increase coreg(carvedilol) to 6.25mg  two times a day.   Your physician recommends that you have  lab work today--BMET/BNP.  You have been referred to Cardiac Rehab at [facility].   Your physician recommends that you schedule a follow-up appointment in: 1 month with Dr [name].  Your physician has requested that you have an echocardiogram. Echocardiography is a painless test that uses sound waves to create images of your heart. It provides your doctor with information about the size and shape of your heart and how well your heart's chambers and valves are working. This procedure takes approximately one hour. There are no restrictions for this procedure. August 2014

## 2012-11-18 NOTE — Telephone Encounter (Signed)
Given to pt at appt 11/18/12 with Dr [name].

## 2012-11-18 NOTE — Progress Notes (Signed)
Patient ID: [name], male   DOB: 02-Jun-1941, 72 y.o.   MRN: [id number] PCP: Dr. [name]  72 yo with history of ARDS/respiratory failure and tracheostomy in 1/14 as well as DM, HTN, and COPD presented to the ER at [facility] with CHF in 4/14. Patient had a prolonged hospitalization in 1/14 with H1N1 influenza and pneumococcal PNA. This progressed to ARDS. He was intubated and ended up with tracheostomy. He has had the trach removed. He also had a left empyema requiring chest tube, septic shock with elevated troponin, and AKI which resolved.   He was admitted again in 4/14 from his nursing home with exertional dyspnea and orthopnea. He had gained 21 lbs.  Echo at admission showed EF 35% with global hypokinesis that looked worse in the anteroseptal wall. He also was noted to have aortic stenosis rated as moderate to severe. He was diuresed and cathed, showing severe 3 vessel disease.  He then had CABG-AVR (bioprosthetic valve).  He did well post-op considering his comorbidities and is now home with his wife.  Echo in 5/14 prior to discharge showed EF up to 50% with well-seated aortic valve.    He is now home with his wife.  He is getting PT.  He walks with a walker for stability.  He has been out of the house to go to church and a few stores.  He is not short of breath walking around the house but does get dyspneic with any longer distances.  No PND.  Mild lightheadedness with standing.  He is using oxygen with ambulation primarily.   ECG: NSR, RBBB, LAFB  Labs (5/14): K 3.6, creatinine 0.86, BNP 11508  PMH: 1. PNA (H1N1 influenza + pneumococcus) in [id number] complicated by respiratory failure/ARDS. Required tracheostomy, now weaned off. Had left empyema requiring chest tube.  2. Prostate CA s/p radiation treatment. Has indwelling foley.  3. OSA  4. HTN  5. Hyperlipidemia  6. COPD: History of heavy smoking.  PFTs (4/14) with mixed obstructive (COPD) and restrictive (post-ARDS) picture.   7. Type  II diabetes  8. Anemia of chronic disease.  9. Ischemic cardiomyopathy: Echo (1/14) with severely dilated LV, EF 30-35%, diffuse hypokinesis worse in the anteroseptal wall, grade II diastolic dysfunction, mild MR, AS interpreted as "moderate to severe" with aortic valve mean gradient 23 mmHg. Echo (4/14) witih EF 35-40%, mid to apical anteroseptal akinesis, moderate to severe AS with mean gradient 33 mmHg, AVA 0.91 cm^2.  Echo (5/14) post CABG showed EF 50%, anteroseptal hypokinesis, bioprosthetic aortic valve well-seated.  10. Aortic stenosis: Moderate to severe by echo in 4/14.  Bioprosthetic #21 [facility] Ease aortic valve replacement in 4/14.   11. CAD: LHC (4/14) with 95% pLAD, 70-80% ostial ramus, 70% mLCx, 90% pRCA, EF 35%.  CABG 4/14 with LIMA-LAD, seq SVG-ramus and OM1, SVG-PDA.  12. Carotid stenosis: Carotid dopplers (0000000) with A999333 LICA stenosis.   13. AAA: 5/14 CT showed > 6 cm AAA, also right iliac aneurysm.  Not stent graft candidate.   SH: Quit smoking in 1/14, married, no ETOH, lives in [place].  FH: No premature CAD  ROS: All systems reviewed and negative except as per HPI.   Current Outpatient Prescriptions  Medication Sig Dispense Refill  . acetaminophen (TYLENOL) 325 MG tablet Take 650 mg by mouth every 8 (eight) hours as needed for pain.      Marland Kitchen aspirin EC 325 MG EC tablet Take 1 tablet (325 mg total) by mouth daily.  30 tablet    .  atorvastatin (LIPITOR) 80 MG tablet Take 1 tablet (80 mg total) by mouth daily.  30 tablet  6  . clonazePAM (KLONOPIN) 0.5 MG tablet Take 0.5 mg by mouth 3 (three) times daily as needed for anxiety.      Marland Kitchen dexamethasone (DECADRON) 4 MG tablet Take 4 mg by mouth daily with breakfast.      . docusate sodium (COLACE) 100 MG capsule Take 100 mg by mouth 2 (two) times daily.      Marland Kitchen dutasteride (AVODART) 0.5 MG capsule Take 0.5 mg by mouth every evening.      Marland Kitchen guaifenesin (GERI-TUSSIN) 100 MG/5ML syrup Take 300 mg by mouth every 4 (four) hours  as needed for cough.      Marland Kitchen ipratropium-albuterol (DUONEB) 0.5-2.5 (3) MG/3ML SOLN Take 3 mLs by nebulization every 6 (six) hours.      Marland Kitchen lactulose (CHRONULAC) 10 GM/15ML solution Take 20 g by mouth daily as needed (constipation).      Marland Kitchen lisinopril (PRINIVIL,ZESTRIL) 5 MG tablet Take 1 tablet (5 mg total) by mouth daily.  30 tablet  6  . senna-docusate (SENOKOT-S) 8.6-50 MG per tablet Take 1 tablet by mouth 2 (two) times daily.      . tamsulosin (FLOMAX) 0.4 MG CAPS Take 0.4 mg by mouth every 12 (twelve) hours.      . traMADol (ULTRAM) 50 MG tablet Take 1-2 tablets (50-100 mg total) by mouth every 4 (four) hours as needed for pain.  30 tablet  0  . carvedilol (COREG) 6.25 MG tablet Take 1 tablet (6.25 mg total) by mouth 2 (two) times daily.  60 tablet  6  . potassium chloride (K-DUR) 10 MEQ tablet Take 1 tablet (10 mEq total) by mouth daily.  30 tablet  6  . torsemide (DEMADEX) 20 MG tablet 2 tablets (total 40mg ) daily at the same time in the morning  60 tablet  6   No current facility-administered medications for this visit.    BP 110/62  Pulse 94  Ht 5\' 6"  (1.676 m)  Wt 195 lb (88.451 kg)  BMI 31.49 kg/m2  SpO2 96% General: NAD Neck: No JVD, no thyromegaly or thyroid nodule.  Lungs: Distant breath sounds.  CV: Nondisplaced PMI.  Heart regular S1/S2 with wide split of S2, no S3/S4, no murmur.  No peripheral edema.  No carotid bruit.  Normal pedal pulses.  Abdomen: Soft, nontender, no hepatosplenomegaly, no distention.  Skin: Intact without lesions or rashes.  Neurologic: Alert and oriented x 3.  Psych: Normal affect. Extremities: No clubbing or cyanosis.   Assessment/Plan: 1. CAD: Status post CABG for 3VD.  No ischemic symptoms currently.   - Continue home PT.  When this completes, he will need to start cardiac rehab at [facility].  - Continue ASA, statin, ACEI, Coreg.  2. Chronic systolic CHF: EF around [id number] prior to surgery, appeared to improve post-op with last echo showing EF  around 50%.  NYHA class III symptoms.  He does not appear volume overloaded on exam.   - Repeat echo in 8/14 3 months post-cath.   - Continue lisinopril, increase Coreg to 6.25 mg bid.  - He has been taking torsemide 40 mg bid.  BUN is rather high today so will cut back on torsemide to 40 mg daily.  Repeat BMET/BNP in 10 days.   3. 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  I suspect that intrinsic lung disease is a contributor to his dyspnea.  4. Carotid stenosis: Repeat carotid dopplers in  10/14.   5. Hyperlipidemia: Continue statin, will need lipids checked.   6. 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  He is not a candidate for stent grafting.  He will need to stabilize out/recover from CABG-AVR prior to vascular surgery.  He will followup with Dr. Kellie Simmering in July.   Loralie Champagne 11/18/2012

## 2012-11-18 NOTE — Telephone Encounter (Signed)
Pt has appt with Dr [name] 11/28/12

## 2012-11-20 ENCOUNTER — Telehealth: Payer: Self-pay | Admitting: Cardiology

## 2012-11-20 NOTE — Telephone Encounter (Signed)
Follow up      At the home now .    C/O crackling in left lung . Please advise

## 2012-11-20 NOTE — Telephone Encounter (Signed)
Go back up on torsemide to 40 mg bid but will need BMET in 1 week.

## 2012-11-20 NOTE — Telephone Encounter (Signed)
Spoke with pt

## 2012-11-20 NOTE — Telephone Encounter (Signed)
New Prob       Pt states his BP steadily increasing and would like to speak to nurse regarding this.

## 2012-11-20 NOTE — Telephone Encounter (Signed)
New problem/fu  Per Juliann Pulse @ [facility] home health pt has gained 6lbs within 2 days

## 2012-11-20 NOTE — Telephone Encounter (Signed)
Pt states since Monday he has gained 6 pounds. He states he may have a little increase in edema and his breathing is stable. On 11/18/12 his torsemide was decreased from 40mg  bid to 40mg  daily. I will forward to Dr [name] for review.

## 2012-11-20 NOTE — Telephone Encounter (Signed)
Pt advised.

## 2012-11-21 ENCOUNTER — Telehealth: Payer: Self-pay | Admitting: Cardiology

## 2012-11-21 NOTE — Telephone Encounter (Signed)
New problem    C/o b/p is low, dizzy, physical therapy cancel session today. Still sob.   Faxing over vital sign

## 2012-11-21 NOTE — Telephone Encounter (Signed)
Spoke with [name], patient's home health nurse who states patient has low blood pressure today, is dizzy, and although patient denies SOB, she states PT was not able to work with him because of his obvious SOB.  Debbie states his BP has decreased as the day has progressed.  Yesterday Dr. [name] increased patient's Torsemide due to crackling in his lungs.  Debbie reports patient is down 1 lb in weight today. I am awaiting patient's blood pressure readings which were recorded and [name] states she is faxing. She did not have these numbers in front of her at the time I talked with her.  I informed her that I would send the message to Dr. [name] for advice.

## 2012-11-21 NOTE — Telephone Encounter (Signed)
Decrease Coreg back to 3.125 mg bid.  Call patient early next week to see if improving.  Will need BMET early next week.

## 2012-11-22 ENCOUNTER — Encounter: Payer: Self-pay | Admitting: Cardiology

## 2012-11-22 ENCOUNTER — Telehealth: Payer: Self-pay | Admitting: Cardiology

## 2012-11-22 NOTE — Telephone Encounter (Signed)
New problem   [facility] stated she has arrived at pt's home and need for you to give her a call. Please call

## 2012-11-22 NOTE — Telephone Encounter (Signed)
Spoke with home health nurse, Jackelyn Poling. Pt's BP now 88/50. Pt advised to decrease coreg to 3.125mg  bid. Jackelyn Poling states his lungs sound terrible today with lots of crackles.  She feels pt needs evaluation. Pt given the option of coming to [facility] ED for evaluation or going to [facility] medical facility).

## 2012-11-22 NOTE — Telephone Encounter (Signed)
Pt states his BP was 122/76 this morning and he feels better.  He does not want to decrease coreg dose from 6.25mg  bid to 3.125mg  bid right now. I spoke with Jackelyn Poling, home health nurse. She is scheduled to see patient today around   12 noon. She will check pt's BP. She will call if, after checking patient, she recommends he decrease coreg to 3.125mg . She is already scheduled to see patient tomorrow. She will be able to draw a BMET 11/26/12.

## 2012-11-22 NOTE — Telephone Encounter (Signed)
Spoke with patient.

## 2012-11-29 ENCOUNTER — Other Ambulatory Visit: Payer: Self-pay | Admitting: *Deleted

## 2012-11-29 ENCOUNTER — Telehealth: Payer: Self-pay | Admitting: Cardiology

## 2012-11-29 DIAGNOSIS — I714 Abdominal aortic aneurysm, without rupture: Secondary | ICD-10-CM

## 2012-11-29 NOTE — Telephone Encounter (Signed)
New Problem  Cathy( who does the monitoring for this patient) states that the patient weighed 201.5 yesterday and weighs 206 today. Patient ate fried fish, hush puppies and french fries last night, even after she reiterated his diet to him.  She just wanted to make Dr [name] aware.

## 2012-11-29 NOTE — Telephone Encounter (Signed)
Will forward 

## 2012-11-29 NOTE — Telephone Encounter (Signed)
LMTCB for [facility] and patient

## 2012-11-29 NOTE — Telephone Encounter (Signed)
Follow Up  [name] returned your call, she said please call her back when you can.

## 2012-11-29 NOTE — Telephone Encounter (Signed)
Cathy notified

## 2012-11-29 NOTE — Telephone Encounter (Signed)
Take extra 20 mg torsemide this evening and stay away from the salty foods.

## 2012-12-02 ENCOUNTER — Telehealth: Payer: Self-pay | Admitting: Cardiology

## 2012-12-02 NOTE — Telephone Encounter (Signed)
Received 3 pages from [facility], sent to Dr. [name]. 12/02/12/ss

## 2012-12-03 ENCOUNTER — Other Ambulatory Visit (INDEPENDENT_AMBULATORY_CARE_PROVIDER_SITE_OTHER): Payer: BC Managed Care – PPO

## 2012-12-03 ENCOUNTER — Other Ambulatory Visit: Payer: Self-pay | Admitting: *Deleted

## 2012-12-03 ENCOUNTER — Ambulatory Visit
Admission: RE | Admit: 2012-12-03 | Discharge: 2012-12-03 | Disposition: A | Discharge status: Home / Self Care | Payer: BC Managed Care – PPO | Source: Ambulatory Visit | Attending: Thoracic Surgery (Cardiothoracic Vascular Surgery) | Admitting: Thoracic Surgery (Cardiothoracic Vascular Surgery)

## 2012-12-03 ENCOUNTER — Ambulatory Visit (INDEPENDENT_AMBULATORY_CARE_PROVIDER_SITE_OTHER): Payer: Self-pay | Admitting: Thoracic Surgery (Cardiothoracic Vascular Surgery)

## 2012-12-03 ENCOUNTER — Encounter: Payer: Self-pay | Admitting: Thoracic Surgery (Cardiothoracic Vascular Surgery)

## 2012-12-03 VITALS — BP 134/90 | HR 86 | Resp 20 | Ht 66.0 in | Wt 195.0 lb

## 2012-12-03 DIAGNOSIS — Z952 Presence of prosthetic heart valve: Secondary | ICD-10-CM

## 2012-12-03 DIAGNOSIS — I359 Nonrheumatic aortic valve disorder, unspecified: Secondary | ICD-10-CM

## 2012-12-03 DIAGNOSIS — I2510 Atherosclerotic heart disease of native coronary artery without angina pectoris: Secondary | ICD-10-CM

## 2012-12-03 DIAGNOSIS — Z951 Presence of aortocoronary bypass graft: Secondary | ICD-10-CM

## 2012-12-03 DIAGNOSIS — Z954 Presence of other heart-valve replacement: Secondary | ICD-10-CM

## 2012-12-03 DIAGNOSIS — I714 Abdominal aortic aneurysm, without rupture: Secondary | ICD-10-CM

## 2012-12-03 DIAGNOSIS — I5022 Chronic systolic (congestive) heart failure: Secondary | ICD-10-CM

## 2012-12-03 LAB — BASIC METABOLIC PANEL
BUN: 41 mg/dL — ABNORMAL HIGH (ref 6–23)
Chloride: 101 mEq/L (ref 96–112)
Creatinine, Ser: 1.1 mg/dL (ref 0.4–1.5)
GFR: 68.76 mL/min (ref >60.00)
Glucose, Bld: 283 mg/dL — ABNORMAL HIGH (ref 70–99)
Potassium: 4 mEq/L (ref 3.5–5.1)

## 2012-12-03 NOTE — Progress Notes (Signed)
HPI: Mr. [name] is a 72 year old gentleman who presented with congestive heart failure back in April. He had been hospitalized with H1N1 and respiratory failure requiring ventilator around the first of the year. He had been discharged to a nursing home that presented back with congestive heart failure. He was found to have severe three-vessel coronary disease and ischemic cardiomyopathy and moderate aortic stenosis. Given that he was high risk went ahead with coronary bypass grafting x4 and aortic valve replacement with pericardial valve on April 25. Postoperatively he required bronchoscopy for retained secretions. He did have thrombocytopenia and HIT panel was sent. Actually two panels were done as there was a delay in getting the results of the first one. The first one was negative. The second was mildly positive although the serotonin release assay was still negative.  He was discharged on postop day #10 to a skilled nursing facility. He has started cardiac rehabilitation. Biggest complaint is trying to eat food without salt. He's not having any incisional pain.  Past Medical History  Diagnosis Date  . Hypertension   . Hyperlipidemia   . OSA (obstructive sleep apnea)     on cpap  . Prediabetes   . CHF (congestive heart failure) 07/2012; 10/17/2012  . COPD (chronic obstructive pulmonary disease)   . Pneumonia 07/2012    "H1N1; ARDS; double pneumonia" (10/17/2012)  . Shortness of breath     "all the time; related to CHF" (10/17/2012)  . Diabetes mellitus without complication     "prediabetic; been getting insulin while in SNF" (10/17/2012)  . History of blood transfusion     "lots since January" (10/17/2012)  . Anxiety       Current Outpatient Prescriptions  Medication Sig Dispense Refill  . acetaminophen (TYLENOL) 325 MG tablet Take 650 mg by mouth every 8 (eight) hours as needed for pain.      Marland Kitchen aspirin EC 325 MG EC tablet Take 1 tablet (325 mg total) by mouth daily.  30 tablet    .  atorvastatin (LIPITOR) 80 MG tablet Take 1 tablet (80 mg total) by mouth daily.  30 tablet  6  . carvedilol (COREG) 3.125 MG tablet Take 1 tablet (3.125 mg total) by mouth 2 (two) times daily.      . clonazePAM (KLONOPIN) 0.5 MG tablet Take 0.5 mg by mouth 3 (three) times daily as needed for anxiety.      Marland Kitchen dexamethasone (DECADRON) 4 MG tablet Take 4 mg by mouth daily with breakfast.      . docusate sodium (COLACE) 100 MG capsule Take 100 mg by mouth 2 (two) times daily.      Marland Kitchen dutasteride (AVODART) 0.5 MG capsule Take 0.5 mg by mouth every evening.      Marland Kitchen guaifenesin (GERI-TUSSIN) 100 MG/5ML syrup Take 300 mg by mouth every 4 (four) hours as needed for cough.      Marland Kitchen ipratropium-albuterol (DUONEB) 0.5-2.5 (3) MG/3ML SOLN Take 3 mLs by nebulization every 6 (six) hours.      Marland Kitchen lisinopril (PRINIVIL,ZESTRIL) 5 MG tablet Take 1 tablet (5 mg total) by mouth daily.  30 tablet  6  . potassium chloride (K-DUR) 10 MEQ tablet Take 1 tablet (10 mEq total) by mouth daily.  30 tablet  6  . senna-docusate (SENOKOT-S) 8.6-50 MG per tablet Take 1 tablet by mouth 2 (two) times daily.      . tamsulosin (FLOMAX) 0.4 MG CAPS Take 0.4 mg by mouth every 12 (twelve) hours.      Marland Kitchen  torsemide (DEMADEX) 20 MG tablet 2 tablets (total 40mg )  two times a day       No current facility-administered medications for this visit.    Physical Exam: BP 134/90  Pulse 86  Resp 20  Ht 5\' 6"  (1.676 m)  Wt 195 lb (88.451 kg)  BMI 31.49 kg/m2  SpO42 70% 71 year old man in no acute distress Neurologic alert and oriented x3 with no deficits Cardiac regular rate and rhythm no murmur Lungs clear Sternum stable, incision clean dry and intact 1+ peripheral edema, leg incisions healing well Extensive ecchymoses both upper extremities  Diagnostic Tests: Chest x-ray 12/03/2012 CHEST - 2 VIEW  Comparison: 11/22/2012  Findings: Chronic interstitial markings/emphysematous changes. No  focal consolidation. No pleural effusion or  pneumothorax.  Mild cardiomegaly. Prosthetic valve. Postsurgical changes related  to prior CABG.  Degenerative changes of the visualized thoracolumbar spine.  IMPRESSION:  No evidence of acute cardiopulmonary disease.   Impression: 72 year old gentleman now about 6 weeks out from aortic valve replacement and coronary bypass grafting x4. All things considered he is doing remarkably well. He was just coming off a severe prolonged illness and a prolonged ICU stay when he was diagnosed and underwent surgery.  He's not having any pain. His exercise tolerance is improving. He has not smoked since January when he became ill, but says he is having some cravings. He asked about nicotine replacement. I advised him not to do that due to cardiovascular effects of the nicotine.  He does have a 7 cm abdominal aortic aneurysm and a 5 cm iliac artery aneurysm that need to be repaired. He has an appointment with Dr. Kellie Simmering on July 1 to address that issue.   I informed him of the HIT panel results and asked him to make sure that he passed information on to Dr. Kellie Simmering at the time of his visit. Of course I will notify Dr. Kellie Simmering that result as well.  He has an appointment with Dr. [name] in about 2 weeks.  Plan: Stable from a cardiac surgical perspective  I will be happy to see Mr. Mahoney back at any time if I can be of any further assistance with his care.

## 2012-12-04 ENCOUNTER — Other Ambulatory Visit: Payer: Self-pay | Admitting: *Deleted

## 2012-12-04 DIAGNOSIS — I714 Abdominal aortic aneurysm, without rupture: Secondary | ICD-10-CM

## 2012-12-05 ENCOUNTER — Telehealth: Payer: Self-pay | Admitting: Cardiology

## 2012-12-05 NOTE — Telephone Encounter (Signed)
Received 4 pages from [facility], sent to Dr. [name]. 12/05/12/ss

## 2012-12-06 ENCOUNTER — Other Ambulatory Visit: Payer: Self-pay | Admitting: Cardiology

## 2012-12-09 ENCOUNTER — Other Ambulatory Visit: Payer: Self-pay

## 2012-12-09 MED ORDER — TORSEMIDE 20 MG PO TABS: ORAL_TABLET | ORAL | Status: DC

## 2012-12-09 NOTE — Telephone Encounter (Signed)
Not sure why he is taking it. Dr [name] would not prescribe.

## 2012-12-09 NOTE — Telephone Encounter (Signed)
Not sure if we refill this med.  Thanks Kalman Shan

## 2012-12-09 NOTE — Telephone Encounter (Signed)
..   Requested Prescriptions   Pending Prescriptions Disp Refills  . torsemide (DEMADEX) 20 MG tablet 120 tablet 6    Sig: 2 tablets (total 40mg )  two times a day

## 2012-12-10 ENCOUNTER — Telehealth: Payer: Self-pay | Admitting: *Deleted

## 2012-12-10 NOTE — Telephone Encounter (Signed)
Returned patient's call left on refill line for medication refill. LMTCB Patient asking for refill of medication. Not sure which medication, it was not left on the voice he left [place] only "4mg  tablets". If it is indeed the 4mg  tablet in which he takes that would be dexamethasone, Dr [name] does not prescribe this medication because it is not cardiac per refill encounter dated 12-06-12. Patient must receive through PCP and pharmacy was told this as well 12-06-12.   Marlis Edelson, CMA

## 2012-12-10 NOTE — Telephone Encounter (Signed)
Patient returning call from message left earlier. I let the patient know that Dr [name] wants this medication refilled through his PCP since Dr [name] did not prescribe this medication and does not know the exact reason he is taking this medication. Patient verbally agreed and will ask PCP for refill.   [name] [employer]

## 2012-12-18 ENCOUNTER — Ambulatory Visit (INDEPENDENT_AMBULATORY_CARE_PROVIDER_SITE_OTHER): Payer: BC Managed Care – PPO | Admitting: Cardiology

## 2012-12-18 ENCOUNTER — Encounter: Payer: Self-pay | Admitting: Cardiology

## 2012-12-18 ENCOUNTER — Ambulatory Visit
Admission: RE | Admit: 2012-12-18 | Discharge: 2012-12-18 | Disposition: A | Discharge status: Home / Self Care | Payer: BC Managed Care – PPO | Source: Ambulatory Visit | Attending: Cardiology | Admitting: Cardiology

## 2012-12-18 VITALS — BP 86/52 | HR 89 | Ht 66.0 in | Wt 212.0 lb

## 2012-12-18 DIAGNOSIS — I509 Heart failure, unspecified: Secondary | ICD-10-CM

## 2012-12-18 DIAGNOSIS — R0602 Shortness of breath: Secondary | ICD-10-CM

## 2012-12-18 DIAGNOSIS — I5033 Acute on chronic diastolic (congestive) heart failure: Secondary | ICD-10-CM

## 2012-12-18 DIAGNOSIS — J9589 Other postprocedural complications and disorders of respiratory system, not elsewhere classified: Secondary | ICD-10-CM

## 2012-12-18 DIAGNOSIS — I25810 Atherosclerosis of coronary artery bypass graft(s) without angina pectoris: Secondary | ICD-10-CM

## 2012-12-18 DIAGNOSIS — I350 Nonrheumatic aortic (valve) stenosis: Secondary | ICD-10-CM

## 2012-12-18 DIAGNOSIS — I714 Abdominal aortic aneurysm, without rupture: Secondary | ICD-10-CM

## 2012-12-18 DIAGNOSIS — I359 Nonrheumatic aortic valve disorder, unspecified: Secondary | ICD-10-CM

## 2012-12-18 DIAGNOSIS — J80 Acute respiratory distress syndrome: Secondary | ICD-10-CM

## 2012-12-18 LAB — BASIC METABOLIC PANEL
CO2: 23 mEq/L (ref 19–32)
Calcium: 8.6 mg/dL (ref 8.4–10.5)
Creatinine, Ser: 1.2 mg/dL (ref 0.4–1.5)
GFR: 66.68 mL/min (ref >60.00)
Glucose, Bld: 145 mg/dL — ABNORMAL HIGH (ref 70–99)

## 2012-12-18 MED ORDER — TORSEMIDE 20 MG PO TABS: ORAL_TABLET | ORAL | Status: DC

## 2012-12-18 NOTE — Patient Instructions (Addendum)
Decrease lisinopril to 2.5mg  daily. This will be 1/2 of a 5mg  tablet daily.  Increase torsemide to 80mg  in the AM and 40mg  in the PM. This will be 4 of your 20mg  tablets in the AM and 2 of your 20mg  tablets in the PM.  Your physician recommends that you have  lab work today--BMET/BNP.  A chest x-ray takes a picture of the organs and structures inside the chest, including the heart, lungs, and blood vessels. This test can show several things, including, whether the heart is enlarges; whether fluid is building up in the lungs; and whether pacemaker / defibrillator leads are still in place. TODAY  Your physician recommends that you schedule a follow-up appointment in: 6/23 or 6/24 with the PA/NP.   Your physician recommends that you return for lab work on 6/23 or 6/24 when you see the PA/NP--BMET/BNP.  Your physician recommends that you schedule a follow-up appointment with Dr [name] in 2-3 weeks.

## 2012-12-19 DIAGNOSIS — I5033 Acute on chronic diastolic (congestive) heart failure: Secondary | ICD-10-CM | POA: Insufficient documentation

## 2012-12-19 NOTE — Progress Notes (Signed)
Patient ID: [name], male   DOB: 1941-04-24, 72 y.o.   MRN: [id number] PCP: Dr. [name]  72 yo with history of ARDS/respiratory failure and tracheostomy in 1/14 as well as DM, HTN, and COPD presented to the ER at [facility] with CHF in 4/14. Patient had a prolonged hospitalization in 1/14 with H1N1 influenza and pneumococcal PNA. This progressed to ARDS. He was intubated and ended up with tracheostomy. He has had the trach removed. He also had a left empyema requiring chest tube, septic shock with elevated troponin, and AKI which resolved.   He was admitted again in 4/14 from his nursing home with exertional dyspnea and orthopnea. He had gained 21 lbs.  Echo at admission showed EF 35% with global hypokinesis that looked worse in the anteroseptal wall. He also was noted to have aortic stenosis rated as moderate to severe. He was diuresed and cathed, showing severe 3 vessel disease.  He then had CABG-AVR (bioprosthetic valve).  He did well post-op considering his comorbidities and is now home with his wife.  Echo in 5/14 prior to discharge showed EF up to 50% with well-seated aortic valve.    He is now home with his wife.  I last saw him in 5/14.  Over the last several weeks, he has developed increased exertional dyspnea to the point where he is short of breath after walking 20-30 feet.  His blood pressure has been low as well, with SBP running in the 90s.  He does have some lightheadedness on occasion.  No chest pain.  He wears his oxygen occasionally at home but did not bring it today. Oxygen saturation at rest today was 89%.  No fever/chills.  No cough.  Weight is up 17 lbs since last appointment.   ECG: NSR, RBBB, LAFB  Labs (5/14): K 3.6, creatinine 0.86, BNP 11508 Labs (6/14): K 4, creatinine 1.1  PMH: 1. PNA (H1N1 influenza + pneumococcus) in [id number] complicated by respiratory failure/ARDS. Required tracheostomy, now weaned off. Had left empyema requiring chest tube.  2. Prostate CA s/p  radiation treatment. Has indwelling foley.  3. OSA  4. HTN  5. Hyperlipidemia  6. COPD: History of heavy smoking.  PFTs (4/14) with mixed obstructive (COPD) and restrictive (post-ARDS) picture.   7. Type II diabetes  8. Anemia of chronic disease.  9. Ischemic cardiomyopathy: Echo (1/14) with severely dilated LV, EF 30-35%, diffuse hypokinesis worse in the anteroseptal wall, grade II diastolic dysfunction, mild MR, AS interpreted as "moderate to severe" with aortic valve mean gradient 23 mmHg. Echo (4/14) witih EF 35-40%, mid to apical anteroseptal akinesis, moderate to severe AS with mean gradient 33 mmHg, AVA 0.91 cm^2.  Echo (5/14) post CABG showed EF 50%, anteroseptal hypokinesis, bioprosthetic aortic valve well-seated.  10. Aortic stenosis: Moderate to severe by echo in 4/14.  Bioprosthetic #21 [facility] Ease aortic valve replacement in 4/14.   11. CAD: LHC (4/14) with 95% pLAD, 70-80% ostial ramus, 70% mLCx, 90% pRCA, EF 35%.  CABG 4/14 with LIMA-LAD, seq SVG-ramus and OM1, SVG-PDA.  12. Carotid stenosis: Carotid dopplers (0000000) with A999333 LICA stenosis.   13. AAA: 5/14 CT showed > 6 cm AAA, also right iliac aneurysm.  Not stent graft candidate.   SH: Quit smoking in 1/14, married, no ETOH, lives in [place].  FH: No premature CAD  ROS: All systems reviewed and negative except as per HPI.   Current Outpatient Prescriptions  Medication Sig Dispense Refill  . aspirin EC 325 MG EC  tablet Take 1 tablet (325 mg total) by mouth daily.  30 tablet    . atorvastatin (LIPITOR) 80 MG tablet Take 1 tablet (80 mg total) by mouth daily.  30 tablet  6  . carvedilol (COREG) 3.125 MG tablet Take 1 tablet (3.125 mg total) by mouth 2 (two) times daily.      . clonazePAM (KLONOPIN) 0.5 MG tablet Take 0.5 mg by mouth 3 (three) times daily as needed for anxiety.      . dutasteride (AVODART) 0.5 MG capsule Take 0.5 mg by mouth every evening.      Marland Kitchen guaifenesin (GERI-TUSSIN) 100 MG/5ML syrup Take 300 mg by  mouth every 4 (four) hours as needed for cough.      Marland Kitchen ipratropium-albuterol (DUONEB) 0.5-2.5 (3) MG/3ML SOLN Take 3 mLs by nebulization every 6 (six) hours.      . pantoprazole (PROTONIX) 40 MG tablet Take 1 tablet by mouth daily.      . potassium chloride (K-DUR) 10 MEQ tablet Take 1 tablet (10 mEq total) by mouth daily.  30 tablet  6  . sitaGLIPtin (JANUVIA) 100 MG tablet Take 100 mg by mouth daily.      Marland Kitchen lisinopril (PRINIVIL,ZESTRIL) 2.5 MG tablet 1/2 of your 5mg  tablet daily      . torsemide (DEMADEX) 20 MG tablet 4 tablets (total 80 mg)  in the AM and 2 tablets (total 40mg ) in the afternoon  180 tablet  3   No current facility-administered medications for this visit.    BP 92/52  Pulse 89  Ht 5\' 6"  (1.676 m)  Wt 212 lb (96.163 kg)  BMI 34.23 kg/m2 General: NAD Neck: JVP 10 cm, no thyromegaly or thyroid nodule.  Lungs: Distant breath sounds.  CV: Nondisplaced PMI.  Heart regular S1/S2 with wide split of S2, no S3/S4, no murmur.  1+ edema to knees bilaterally (new).  No carotid bruit.  Normal pedal pulses.  Abdomen: Soft, nontender, no hepatosplenomegaly, no distention.  Skin: Intact without lesions or rashes.  Neurologic: Alert and oriented x 3.  Psych: Normal affect. Extremities: No clubbing or cyanosis.   Assessment/Plan: 1. CAD: Status post CABG for 3VD.  No chest pain   - When CHF is improved, will have him do cardiac rehab at the hospital in [place].  - Continue ASA, statin.  - He is taking Coreg at 3.125 mg bid which I will continue.  I am going to cut his lisinopril down to 2.5 mg daily given low blood pressure.  2. Chronic systolic CHF: EF around [id number] prior to surgery, appeared to improve post-op with last echo showing EF around 50%.  NYHA class IIIb symptoms at this point.  He has gained 17 lbs and appears volume overloaded.  Given the low oxygen saturation, volume overload, and soft blood pressure, I strongly recommended admission to the hospital today.  He is clear  that he wants to avoid hospitalization and understands the risk.  His daughter is a Marine scientist and will come stay with him.   - He has been taking torsemide 40 mg bid.  I am going to increase this to 80 qam, 40 qpm for the next week.  He will followup in the office next week and will get BMET and BNP at that time => torsemide can be adjusted then if needed.  I will also get BMET/BNP today.  - Increase KCl to 20 with higher torsemide dose. - Repeat echo in 8/14 3 months post-cath.   - Coreg already decreased  to 3.125 mg bid, will decrease lisinopril to 2.5 mg daily.  - CXR today.  3. 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  I suspect that intrinsic lung disease is a contributor to his dyspnea and hypoxemia.  He should be on home oxygen but is not wearing it today with oxygen saturation 89% at rest.  He will start wearing his oxygen at all times.  4. Carotid stenosis: Repeat carotid dopplers in 10/14.   5. Hyperlipidemia: Continue statin, will need lipids checked in the future.   6. 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  He is not a candidate for stent grafting.  He will need to stabilize out/recover from CABG-AVR prior to vascular surgery.  He will followup with Dr. Kellie Simmering in July.   As mentioned above, I strongly recommended that he go to the hospital but he wants to try to avoid this.  I told him that if he does not feel better with increased torsemide, he should not wait until next appointment but enter hospital.  Loralie Champagne 12/19/2012

## 2012-12-20 ENCOUNTER — Inpatient Hospital Stay (HOSPITAL_COMMUNITY)
Admission: EM | Admit: 2012-12-20 | Discharge: 2012-12-26 | DRG: 291 | Disposition: A | Discharge status: Home / Self Care | Payer: Medicare Other | Attending: Cardiology | Admitting: Cardiology

## 2012-12-20 ENCOUNTER — Telehealth: Payer: Self-pay | Admitting: Cardiology

## 2012-12-20 ENCOUNTER — Encounter (HOSPITAL_COMMUNITY): Payer: Self-pay | Admitting: *Deleted

## 2012-12-20 ENCOUNTER — Emergency Department (HOSPITAL_COMMUNITY): Payer: Medicare Other

## 2012-12-20 DIAGNOSIS — D638 Anemia in other chronic diseases classified elsewhere: Secondary | ICD-10-CM | POA: Diagnosis present

## 2012-12-20 DIAGNOSIS — Z923 Personal history of irradiation: Secondary | ICD-10-CM | POA: Diagnosis not present

## 2012-12-20 DIAGNOSIS — R0602 Shortness of breath: Secondary | ICD-10-CM

## 2012-12-20 DIAGNOSIS — I4510 Unspecified right bundle-branch block: Secondary | ICD-10-CM | POA: Diagnosis present

## 2012-12-20 DIAGNOSIS — G4733 Obstructive sleep apnea (adult) (pediatric): Secondary | ICD-10-CM | POA: Diagnosis present

## 2012-12-20 DIAGNOSIS — I714 Abdominal aortic aneurysm, without rupture, unspecified: Secondary | ICD-10-CM | POA: Diagnosis present

## 2012-12-20 DIAGNOSIS — J449 Chronic obstructive pulmonary disease, unspecified: Secondary | ICD-10-CM | POA: Diagnosis present

## 2012-12-20 DIAGNOSIS — Z7982 Long term (current) use of aspirin: Secondary | ICD-10-CM

## 2012-12-20 DIAGNOSIS — I723 Aneurysm of iliac artery: Secondary | ICD-10-CM | POA: Diagnosis present

## 2012-12-20 DIAGNOSIS — IMO0002 Reserved for concepts with insufficient information to code with codable children: Secondary | ICD-10-CM | POA: Diagnosis not present

## 2012-12-20 DIAGNOSIS — I5043 Acute on chronic combined systolic (congestive) and diastolic (congestive) heart failure: Secondary | ICD-10-CM | POA: Diagnosis present

## 2012-12-20 DIAGNOSIS — J9589 Other postprocedural complications and disorders of respiratory system, not elsewhere classified: Secondary | ICD-10-CM | POA: Diagnosis present

## 2012-12-20 DIAGNOSIS — I509 Heart failure, unspecified: Secondary | ICD-10-CM | POA: Diagnosis present

## 2012-12-20 DIAGNOSIS — Z952 Presence of prosthetic heart valve: Secondary | ICD-10-CM | POA: Diagnosis not present

## 2012-12-20 DIAGNOSIS — Z8546 Personal history of malignant neoplasm of prostate: Secondary | ICD-10-CM

## 2012-12-20 DIAGNOSIS — N289 Disorder of kidney and ureter, unspecified: Secondary | ICD-10-CM | POA: Diagnosis present

## 2012-12-20 DIAGNOSIS — J9620 Acute and chronic respiratory failure, unspecified whether with hypoxia or hypercapnia: Secondary | ICD-10-CM

## 2012-12-20 DIAGNOSIS — I2510 Atherosclerotic heart disease of native coronary artery without angina pectoris: Secondary | ICD-10-CM | POA: Diagnosis present

## 2012-12-20 DIAGNOSIS — I5033 Acute on chronic diastolic (congestive) heart failure: Secondary | ICD-10-CM | POA: Diagnosis present

## 2012-12-20 DIAGNOSIS — E1159 Type 2 diabetes mellitus with other circulatory complications: Secondary | ICD-10-CM

## 2012-12-20 DIAGNOSIS — I6529 Occlusion and stenosis of unspecified carotid artery: Secondary | ICD-10-CM | POA: Diagnosis present

## 2012-12-20 DIAGNOSIS — J80 Acute respiratory distress syndrome: Secondary | ICD-10-CM

## 2012-12-20 DIAGNOSIS — F419 Anxiety disorder, unspecified: Secondary | ICD-10-CM

## 2012-12-20 DIAGNOSIS — Z951 Presence of aortocoronary bypass graft: Secondary | ICD-10-CM

## 2012-12-20 DIAGNOSIS — E119 Type 2 diabetes mellitus without complications: Secondary | ICD-10-CM | POA: Diagnosis present

## 2012-12-20 DIAGNOSIS — J8410 Pulmonary fibrosis, unspecified: Secondary | ICD-10-CM | POA: Diagnosis present

## 2012-12-20 DIAGNOSIS — F411 Generalized anxiety disorder: Secondary | ICD-10-CM | POA: Diagnosis present

## 2012-12-20 DIAGNOSIS — I10 Essential (primary) hypertension: Secondary | ICD-10-CM | POA: Diagnosis present

## 2012-12-20 DIAGNOSIS — I951 Orthostatic hypotension: Secondary | ICD-10-CM

## 2012-12-20 DIAGNOSIS — I959 Hypotension, unspecified: Secondary | ICD-10-CM | POA: Diagnosis present

## 2012-12-20 DIAGNOSIS — E785 Hyperlipidemia, unspecified: Secondary | ICD-10-CM | POA: Diagnosis present

## 2012-12-20 DIAGNOSIS — I5023 Acute on chronic systolic (congestive) heart failure: Secondary | ICD-10-CM

## 2012-12-20 DIAGNOSIS — E1165 Type 2 diabetes mellitus with hyperglycemia: Secondary | ICD-10-CM

## 2012-12-20 DIAGNOSIS — Z953 Presence of xenogenic heart valve: Secondary | ICD-10-CM

## 2012-12-20 DIAGNOSIS — I359 Nonrheumatic aortic valve disorder, unspecified: Secondary | ICD-10-CM | POA: Diagnosis present

## 2012-12-20 DIAGNOSIS — Z87891 Personal history of nicotine dependence: Secondary | ICD-10-CM | POA: Diagnosis not present

## 2012-12-20 DIAGNOSIS — J4489 Other specified chronic obstructive pulmonary disease: Secondary | ICD-10-CM | POA: Diagnosis present

## 2012-12-20 DIAGNOSIS — R0902 Hypoxemia: Secondary | ICD-10-CM

## 2012-12-20 HISTORY — DX: Ischemic cardiomyopathy: I25.5

## 2012-12-20 HISTORY — DX: Malignant neoplasm of prostate: C61

## 2012-12-20 HISTORY — DX: Disorder of arteries and arterioles, unspecified: I77.9

## 2012-12-20 HISTORY — DX: Anemia in other chronic diseases classified elsewhere: D63.8

## 2012-12-20 HISTORY — DX: Peripheral vascular disease, unspecified: I73.9

## 2012-12-20 HISTORY — DX: Abdominal aortic aneurysm, without rupture: I71.4

## 2012-12-20 HISTORY — DX: Abdominal aortic aneurysm, without rupture, unspecified: I71.40

## 2012-12-20 HISTORY — DX: Nonrheumatic aortic (valve) stenosis: I35.0

## 2012-12-20 HISTORY — DX: Atherosclerotic heart disease of native coronary artery without angina pectoris: I25.10

## 2012-12-20 LAB — GLUCOSE, CAPILLARY: Glucose-Capillary: 124 mg/dL — ABNORMAL HIGH (ref 70–99)

## 2012-12-20 LAB — BASIC METABOLIC PANEL
BUN: 36 mg/dL — ABNORMAL HIGH (ref 6–23)
CO2: 27 mEq/L (ref 19–32)
Calcium: 9 mg/dL (ref 8.4–10.5)
Chloride: 102 mEq/L (ref 96–112)
Creatinine, Ser: 1.31 mg/dL (ref 0.50–1.35)

## 2012-12-20 LAB — CBC
HCT: 30.8 % — ABNORMAL LOW (ref 39.0–52.0)
MCH: 27.1 pg (ref 26.0–34.0)
MCV: 87 fL (ref 78.0–100.0)
Platelets: 244 10*3/uL (ref 150–400)
RBC: 3.54 MIL/uL — ABNORMAL LOW (ref 4.22–5.81)
RDW: 19.4 % — ABNORMAL HIGH (ref 11.5–15.5)

## 2012-12-20 LAB — PRO B NATRIURETIC PEPTIDE: Pro B Natriuretic peptide (BNP): 2056 pg/mL — ABNORMAL HIGH (ref 0–125)

## 2012-12-20 MED ORDER — PANTOPRAZOLE SODIUM 40 MG PO TBEC
40.0000 mg | DELAYED_RELEASE_TABLET | Freq: Every day | ORAL | Status: DC
  Administered 2012-12-21 – 2012-12-26 (×6): 40 mg via ORAL
  Filled 2012-12-20 (×7): qty 1

## 2012-12-20 MED ORDER — HEPARIN SODIUM (PORCINE) 5000 UNIT/ML IJ SOLN
5000.0000 [IU] | Freq: Three times a day (TID) | INTRAMUSCULAR | Status: DC
  Administered 2012-12-21 – 2012-12-26 (×16): 5000 [IU] via SUBCUTANEOUS
  Filled 2012-12-20 (×19): qty 1

## 2012-12-20 MED ORDER — IPRATROPIUM BROMIDE 0.02 % IN SOLN: 0.5000 mg | Freq: Four times a day (QID) | RESPIRATORY_TRACT | Status: DC | PRN

## 2012-12-20 MED ORDER — ACETAMINOPHEN 325 MG PO TABS
650.0000 mg | ORAL_TABLET | ORAL | Status: DC | PRN
  Administered 2012-12-22 – 2012-12-23 (×3): 650 mg via ORAL
  Filled 2012-12-20 (×3): qty 2

## 2012-12-20 MED ORDER — LISINOPRIL 2.5 MG PO TABS
2.5000 mg | ORAL_TABLET | Freq: Every day | ORAL | Status: DC
  Administered 2012-12-21 – 2012-12-26 (×6): 2.5 mg via ORAL
  Filled 2012-12-20 (×6): qty 1

## 2012-12-20 MED ORDER — SODIUM CHLORIDE 0.9 % IV SOLN: 250.0000 mL | INTRAVENOUS | Status: DC | PRN

## 2012-12-20 MED ORDER — SODIUM CHLORIDE 0.9 % IJ SOLN
3.0000 mL | Freq: Two times a day (BID) | INTRAMUSCULAR | Status: DC
  Administered 2012-12-21 – 2012-12-26 (×12): 3 mL via INTRAVENOUS

## 2012-12-20 MED ORDER — LINAGLIPTIN 5 MG PO TABS
5.0000 mg | ORAL_TABLET | Freq: Every day | ORAL | Status: DC
  Administered 2012-12-21 – 2012-12-26 (×6): 5 mg via ORAL
  Filled 2012-12-20 (×6): qty 1

## 2012-12-20 MED ORDER — SODIUM CHLORIDE 0.9 % IJ SOLN: 3.0000 mL | INTRAMUSCULAR | Status: DC | PRN

## 2012-12-20 MED ORDER — DIPHENHYDRAMINE HCL 50 MG/ML IJ SOLN: 25.0000 mg | Freq: Once | INTRAMUSCULAR | Status: DC

## 2012-12-20 MED ORDER — ONDANSETRON HCL 4 MG/2ML IJ SOLN: 4.0000 mg | Freq: Four times a day (QID) | INTRAMUSCULAR | Status: DC | PRN

## 2012-12-20 MED ORDER — POTASSIUM CHLORIDE ER 10 MEQ PO TBCR
40.0000 meq | EXTENDED_RELEASE_TABLET | Freq: Every day | ORAL | Status: DC
  Administered 2012-12-21 – 2012-12-26 (×6): 40 meq via ORAL
  Filled 2012-12-20 (×6): qty 4

## 2012-12-20 MED ORDER — INSULIN ASPART 100 UNIT/ML ~~LOC~~ SOLN
0.0000 [IU] | Freq: Three times a day (TID) | SUBCUTANEOUS | Status: DC
  Administered 2012-12-22 – 2012-12-24 (×3): 1 [IU] via SUBCUTANEOUS

## 2012-12-20 MED ORDER — ASPIRIN EC 325 MG PO TBEC
325.0000 mg | DELAYED_RELEASE_TABLET | Freq: Every day | ORAL | Status: DC
  Administered 2012-12-21 – 2012-12-26 (×6): 325 mg via ORAL
  Filled 2012-12-20 (×6): qty 1

## 2012-12-20 MED ORDER — FUROSEMIDE 10 MG/ML IJ SOLN
80.0000 mg | Freq: Three times a day (TID) | INTRAMUSCULAR | Status: DC
  Administered 2012-12-20 – 2012-12-25 (×14): 80 mg via INTRAVENOUS
  Filled 2012-12-20 (×18): qty 8

## 2012-12-20 MED ORDER — FAMOTIDINE IN NACL 20-0.9 MG/50ML-% IV SOLN: 20.0000 mg | Freq: Once | INTRAVENOUS | Status: DC

## 2012-12-20 MED ORDER — ATORVASTATIN CALCIUM 80 MG PO TABS
80.0000 mg | ORAL_TABLET | Freq: Every day | ORAL | Status: DC
  Administered 2012-12-21 – 2012-12-26 (×6): 80 mg via ORAL
  Filled 2012-12-20 (×6): qty 1

## 2012-12-20 MED ORDER — GUAIFENESIN 100 MG/5ML PO SYRP
300.0000 mg | ORAL_SOLUTION | ORAL | Status: DC | PRN
  Filled 2012-12-20: qty 15

## 2012-12-20 MED ORDER — CARVEDILOL 3.125 MG PO TABS
3.1250 mg | ORAL_TABLET | Freq: Two times a day (BID) | ORAL | Status: DC
  Administered 2012-12-21 – 2012-12-26 (×9): 3.125 mg via ORAL
  Filled 2012-12-20 (×13): qty 1

## 2012-12-20 MED ORDER — ALBUTEROL SULFATE (5 MG/ML) 0.5% IN NEBU: 2.5000 mg | INHALATION_SOLUTION | Freq: Four times a day (QID) | RESPIRATORY_TRACT | Status: DC | PRN

## 2012-12-20 NOTE — ED Notes (Signed)
Dr. Allen at the bedside.  

## 2012-12-20 NOTE — Discharge Summary (Signed)
Note signed as DC summary in error. See H/P.Tyran Huser C. Verl Blalock, MD, [place] Pager:  [phone]

## 2012-12-20 NOTE — Telephone Encounter (Signed)
Patient is going to ED because over fluid overload. Was seen/advised on 6/18 by Dr. [name] that hospital admission was needed related to fluid overload problems. Patient declined to go at that time but is now agreeing to the recommendation. He has gained over three pounds overnight and is having significant SOB and states that "the fluid pills are just not working".  Advised patient to go to [facility] ED. Patient states his wife will be taking him as soon as they get ready.  Hospital DOD will be notified.

## 2012-12-20 NOTE — ED Notes (Signed)
Phlebotomy at the bedside  

## 2012-12-20 NOTE — Telephone Encounter (Signed)
Reviewed. cdm 

## 2012-12-20 NOTE — H&P (Signed)
History and Physical  Patient ID: [name] MRN: [id number], DOB: Mar 14, 1941 Date of Encounter: 12/20/2012, 7:20 PM Primary Physician: [name], MD Primary Cardiologist: DM  Chief Complaint: SOB Reason for Admission: CHF  HPI: 72 yo with history of AAA, CHF, CAD s/p CABG & AVR, ARDS/respiratory failure and tracheostomy in 1/14 as well as DM, HTN, and COPD presented to [facility] this evening with SOB, after recently being seen in the office with recommendation to be admitted for CHF and intrinsic lung disease. He is on 2L/min O2 at home.  With regard to history, he presented to the ER at [facility] with CHF in 4/14. Patient had a prolonged hospitalization in 1/14 with H1N1 influenza and pneumococcal PNA. This progressed to ARDS. He was intubated and ended up with tracheostomy. He has had the trach removed. He also had a left empyema requiring chest tube, septic shock with elevated troponin, and AKI which resolved. He was admitted again in 4/14 from his nursing home with exertional dyspnea and orthopnea. He had gained 21 lbs. Echo at admission showed EF 35% with global hypokinesis that looked worse in the anteroseptal Shlonda Dolloff. He also was noted to have aortic stenosis rated as moderate to severe. He was diuresed and cathed, showing severe 3 vessel disease. He then had CABG-AVR (bioprosthetic valve). He did well post-op considering his comorbidities and has returned home with his wife. Echo in 5/14 prior to discharge showed EF up to 50% with well-seated aortic valve.   In the office, he was noted to have desaturation at 89% and weight gain up 17 lbs since last appointment. Dr. [name] strongly recommended admission to the hospital but the patient declined, instead electing outpatient adjustment of diuretics. He reports med compliance. He initially lost 2 lbs after his torsemide was increased but the following day he gained 3 and realized he was headed in the wrong direction. Thus he  presented to the ER this evening with persistent SOB, orthopnea, abdominal tightness, and cough without fever. No CP, nausea, vomiting or syncope. CXR with bilat fine airspace disease ? pulm edema. pBNP 2056, was 225 the other day. BUN 36/Cr1.31, Hgb 9.8 (previously 10.8). He denies any bleeding, fever, or chills.  PMH: 1. PNA (H1N1 influenza + pneumococcus) in [id number] complicated by respiratory failure/ARDS. Required tracheostomy, now weaned off. Had left empyema requiring chest tube.  2. Prostate CA s/p radiation treatment. Has indwelling foley.  3. OSA  4. HTN  5. Hyperlipidemia  6. COPD: History of heavy smoking. PFTs (4/14) with mixed obstructive (COPD) and restrictive (post-ARDS) picture.  7. Type II diabetes  8. Anemia of chronic disease.  9. Ischemic cardiomyopathy: Echo (1/14) with severely dilated LV, EF 30-35%, diffuse hypokinesis worse in the anteroseptal Julen Rubert, grade II diastolic dysfunction, mild MR, AS interpreted as "moderate to severe" with aortic valve mean gradient 23 mmHg. Echo (4/14) witih EF 35-40%, mid to apical anteroseptal akinesis, moderate to severe AS with mean gradient 33 mmHg, AVA 0.91 cm^2. Echo (5/14) post CABG showed EF 50%, anteroseptal hypokinesis, bioprosthetic aortic valve well-seated.  10. Aortic stenosis: Moderate to severe by echo in 4/14. Bioprosthetic #21 [facility] Ease aortic valve replacement in 4/14.  11. CAD: Moderate to severe by echo in 4/14. Bioprosthetic #21 [facility] Ease aortic valve replacement in 4/14.  12. Carotid stenosis: Carotid dopplers (0000000) with A999333 LICA stenosis.  13. AAA: 5/14 CT showed > 6 cm AAA, also right iliac aneurysm. Not stent graft candidate.   Surgical History:  Past Surgical History  Procedure Laterality Date  . Transurethral microwave therapy  10/15/2012  . Nasal fracture surgery  1970's  . Tracheostomy  07/2012  . Tracheostomy closure  08/2012  . Anal fissure repair  2008  . Coronary artery bypass graft N/A  10/25/2012    Procedure: CORONARY ARTERY BYPASS GRAFTING (CABG);  Surgeon: Melrose Nakayama, MD;  Location: [place];  Service: Open Heart Surgery;  Laterality: N/A;  times 4 using left internal mammary artery and endoscopically harvested bilateral saphenous vein   . Aortic valve replacement N/A 10/25/2012    Procedure: AORTIC VALVE REPLACEMENT (AVR);  Surgeon: Melrose Nakayama, MD;  Location: [place];  Service: Open Heart Surgery;  Laterality: N/A;  . Intraoperative transesophageal echocardiogram N/A 10/25/2012    Procedure: INTRAOPERATIVE TRANSESOPHAGEAL ECHOCARDIOGRAM;  Surgeon: Melrose Nakayama, MD;  Location: [place];  Service: Open Heart Surgery;  Laterality: N/A;     Home Meds: Prior to Admission medications   Medication Sig Start Date End Date Taking? Authorizing Provider  aspirin EC 325 MG EC tablet Take 1 tablet (325 mg total) by mouth daily. 11/04/12  Yes Donielle Liston Alba, PA-C  atorvastatin (LIPITOR) 80 MG tablet Take 1 tablet (80 mg total) by mouth daily. 11/18/12  Yes Larey Dresser, MD  carvedilol (COREG) 3.125 MG tablet Take 1 tablet (3.125 mg total) by mouth 2 (two) times daily. 11/22/12  Yes Larey Dresser, MD  guaifenesin (GERI-TUSSIN) 100 MG/5ML syrup Take 300 mg by mouth every 4 (four) hours as needed for cough.   Yes Historical Provider, MD  ipratropium-albuterol (DUONEB) 0.5-2.5 (3) MG/3ML SOLN Take 3 mLs by nebulization every 6 (six) hours.   Yes Historical Provider, MD  lisinopril (PRINIVIL,ZESTRIL) 5 MG tablet Take 2.5 mg by mouth daily.   Yes Historical Provider, MD  pantoprazole (PROTONIX) 40 MG tablet Take 1 tablet by mouth daily. 12/17/12  Yes Historical Provider, MD  potassium chloride (K-DUR) 10 MEQ tablet Take 40 mEq by mouth daily.   Yes Historical Provider, MD  sitaGLIPtin (JANUVIA) 100 MG tablet Take 100 mg by mouth daily.   Yes Historical Provider, MD  torsemide (DEMADEX) 20 MG tablet 4 tablets (total 80 mg)  in the AM and 2 tablets (total 40mg ) in the  afternoon 12/18/12  Yes Larey Dresser, MD    Allergies:  Allergies  Allergen Reactions  . Cephalosporins Rash    History   Social History  . Marital Status: Single    Spouse Name: N/A    Number of Children: N/A  . Years of Education: N/A   Occupational History  . Not on file.   Social History Main Topics  . Smoking status: Former Smoker -- 2.00 packs/day for 58 years    Types: Cigarettes    Quit date: 07/20/2012  . Smokeless tobacco: Never Used  . Alcohol Use: No     Comment: 10/17/2012 "years since I've had a drink; never had problem with it"  . Drug Use: No  . Sexually Active: Not Currently   Other Topics Concern  . Not on file   Social History Narrative  . No narrative on file     Family History: no known premature CAD  Review of Systems: General: negative for chills, fever, night sweats Cardiovascular: see above Respiratory: see above Urologic: negative for hematuria Abdominal: negative for nausea, vomiting, diarrhea, bright red blood per rectum, melena, or hematemesis Neurologic: negative for visual changes, syncope, or dizziness All other systems reviewed and are otherwise  negative except as noted above.  Labs:   Lab Results  Component Value Date   WBC 5.7 12/20/2012   HGB 9.6* 12/20/2012   HCT 30.8* 12/20/2012   MCV 87.0 12/20/2012   PLT 244 12/20/2012    Recent Labs Lab 12/20/12 1818  NA 138  K 4.2  CL 102  CO2 27  BUN 36*  CREATININE 1.31  CALCIUM 9.0  GLUCOSE 123*   Troponin neg x 1  Lab Results  Component Value Date   CHOL 151 10/25/2012   HDL 47 10/25/2012   LDLCALC 76 10/25/2012   TRIG 140 10/25/2012   Radiology/Studies:  Dg Chest 2 View  12/20/2012   *RADIOLOGY REPORT*  Clinical Data: Short of breath, fluid retention  CHEST - 2 VIEW  Comparison: Chest radiograph 12/18/2012  Findings: Sternotomy wires overlie normal cardiac silhouette. There is bilateral air space disease not changed from prior.  Trace pleural effusions are noted.   No focal consolidation.  IMPRESSION: Bilateral fine air space disease suggests pulmonary edema.  Cannot exclude atypical infection.   Original Report Authenticated By: Suzy Bouchard, M.D.    EKG: NSR 92bpm RBBB no acute changes  Physical Exam: Blood pressure 108/70, pulse 68, temperature 97.8 F (36.6 C), temperature source Oral, resp. rate 30, SpO2 98.00%. General: Well developed, well nourished WM chronically ill appearing in no acute distress. Head: Normocephalic, atraumatic, sclera non-icteric, no xanthomas, nares are without discharge.  Neck: JVD mildly elevated. Lungs: Bilateral crackles at bases, coarse throughout otherwise, no wheezes or rhonchi. He does become dyspneic even with change of position in bed.  Heart: RRR mildly tachycardic S1, split S2. No murmurs, rubs, or gallops appreciated. Abdomen: Soft but rounded with normoactive bowel sounds. No hepatomegaly. No rebound/guarding. No obvious abdominal masses. Msk:  Strength and tone appear normal for age. Extremities: No clubbing or cyanosis. 1+ LE edema L>R.  Distal pedal pulses are 2+ and equal bilaterally. Neuro: Alert and oriented X 3. No focal deficit. No facial asymmetry. Moves all extremities spontaneously. Psych:  Responds to questions appropriately with a normal affect.    ASSESSMENT AND PLAN:  1. 2. Acute on chronic systolic CHF: EF around [id number] prior to surgery, appeared to improve post-op with last echo showing EF around 50% - per d/w Dr. Verl Blalock, initiate Lasix 80mg  IV TID. Watch renal function, I&O's. Continue ACEI and BB. 2. CAD: Status post CABG for 3VD. No chest pain. Continue ASA, statin, low dose BB. 3. 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 There is question of intrinsic lung disease contributing to his dyspnea and hypoxemia. O2 PRN. Will need to assess exertional requirement while in the hopsital as well. 4. Carotid stenosis: Repeat carotid dopplers in 10/14.  5. Hyperlipidemia:  Continue statin. 6. 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 He is not a candidate for stent grafting. He will need to stabilize out/recover from CABG-AVR prior to vascular surgery. He will followup with Dr. Kellie Simmering in July.  7. Anemia of chronic disease: will follow. 8. Mild renal insufficiency: follow with diuresis.  Signed, Dayna Dunn PA-C 12/20/2012, 7:20 PM  I have taken a history, reviewed medications, allergies, PMH, SH, FH, and reviewed ROS and examined the patient.  I agree with the assessment and plan. He most likely has some bowel edema with his abdominal bloating which is precluding good absorption of his by mouth diuretics. Will admit to give IV diuretics, bed rest, and try to return his weight baseline couple days. Discussed with  wife and patient agree with plan.  Azhar Yogi C. Verl Blalock, MD, [place] Pager:  [phone].  Jenell Milliner, MD 12/20/2012 8:08 PM

## 2012-12-20 NOTE — ED Notes (Signed)
Report attempted 

## 2012-12-20 NOTE — Plan of Care (Signed)
Problem: Consults Goal: Heart Failure Patient Education (See Patient Education module for education specifics.)  Outcome: Progressing Heart failure education started, HF tool given. We'll continue with POC.

## 2012-12-20 NOTE — Progress Notes (Signed)
Pt admitted to 4700, room 4736 came by stretcher, wife and daughter by the bedside. VSS, denies any pain or discomfort at this time. Pt and family oriented to the unit and to his room and encouraged them to call for assistance to get OOB to prevent any fall or injury while in the hospital. We'll continue with POC.

## 2012-12-20 NOTE — ED Notes (Signed)
Pt reports recently being admitted for chf, dc home on 6/18 with increase in fluid pills. Reports having lost 2 lbs prior to discharge but has now gained 3 back, denies increase in sob. Airway intact.

## 2012-12-20 NOTE — ED Provider Notes (Signed)
History     CSN: [id number]  Arrival date & time 12/20/12  1726   First MD Initiated Contact with Patient 12/20/12 1749      Chief Complaint  Patient presents with  . Shortness of Breath    (Consider location/radiation/quality/duration/timing/severity/associated sxs/prior treatment) Patient is a 72 y.o. male presenting with shortness of breath. The history is provided by the patient and the spouse.  Shortness of Breath pt here with dyspnea and increased weight gain--h/o chf and saw dr, Marigene Ehlers 2 days ago who wanted to admit the pt for inpatient diuresis--pt denies anginal type chest pain--sx are worse with exertion or lying flat--some cough without fever--abdominal tightness with v/d--sx have been persistent--was also recently dx with hypokalema and given meds for this  Past Medical History  Diagnosis Date  . Hypertension   . Hyperlipidemia   . OSA (obstructive sleep apnea)     on cpap  . Prediabetes   . CHF (congestive heart failure) 07/2012; 10/17/2012  . COPD (chronic obstructive pulmonary disease)   . Pneumonia 07/2012    "H1N1; ARDS; double pneumonia" (10/17/2012)  . Shortness of breath     "all the time; related to CHF" (10/17/2012)  . Diabetes mellitus without complication     "prediabetic; been getting insulin while in SNF" (10/17/2012)  . History of blood transfusion     "lots since January" (10/17/2012)  . Anxiety     Past Surgical History  Procedure Laterality Date  . Transurethral microwave therapy  10/15/2012  . Nasal fracture surgery  1970's  . Tracheostomy  07/2012  . Tracheostomy closure  08/2012  . Anal fissure repair  2008  . Coronary artery bypass graft N/A 10/25/2012    Procedure: CORONARY ARTERY BYPASS GRAFTING (CABG);  Surgeon: Melrose Nakayama, MD;  Location: [facility];  Service: Open Heart Surgery;  Laterality: N/A;  times 4 using left internal mammary artery and endoscopically harvested bilateral saphenous vein   . Aortic valve replacement N/A 10/25/2012     Procedure: AORTIC VALVE REPLACEMENT (AVR);  Surgeon: Melrose Nakayama, MD;  Location: [facility];  Service: Open Heart Surgery;  Laterality: N/A;  . Intraoperative transesophageal echocardiogram N/A 10/25/2012    Procedure: INTRAOPERATIVE TRANSESOPHAGEAL ECHOCARDIOGRAM;  Surgeon: Melrose Nakayama, MD;  Location: [facility];  Service: Open Heart Surgery;  Laterality: N/A;    History reviewed. No pertinent family history.  History  Substance Use Topics  . Smoking status: Former Smoker -- 2.00 packs/day for 58 years    Types: Cigarettes    Quit date: 07/20/2012  . Smokeless tobacco: Never Used  . Alcohol Use: No     Comment: 10/17/2012 "years since I've had a drink; never had problem with it"      Review of Systems  Respiratory: Positive for shortness of breath.   All other systems reviewed and are negative.    Allergies  Cephalosporins  Home Medications   Current Outpatient Rx  Name  Route  Sig  Dispense  Refill  . aspirin EC 325 MG EC tablet   Oral   Take 1 tablet (325 mg total) by mouth daily.   30 tablet      . atorvastatin (LIPITOR) 80 MG tablet   Oral   Take 1 tablet (80 mg total) by mouth daily.   30 tablet   6   . carvedilol (COREG) 3.125 MG tablet   Oral   Take 1 tablet (3.125 mg total) by mouth 2 (two) times daily.         Marland Kitchen  guaifenesin (GERI-TUSSIN) 100 MG/5ML syrup   Oral   Take 300 mg by mouth every 4 (four) hours as needed for cough.         Marland Kitchen ipratropium-albuterol (DUONEB) 0.5-2.5 (3) MG/3ML SOLN   Nebulization   Take 3 mLs by nebulization every 6 (six) hours.         Marland Kitchen lisinopril (PRINIVIL,ZESTRIL) 5 MG tablet   Oral   Take 2.5 mg by mouth daily.         . pantoprazole (PROTONIX) 40 MG tablet   Oral   Take 1 tablet by mouth daily.         . potassium chloride (K-DUR) 10 MEQ tablet   Oral   Take 40 mEq by mouth daily.         . sitaGLIPtin (JANUVIA) 100 MG tablet   Oral   Take 100 mg by mouth daily.         Marland Kitchen torsemide  (DEMADEX) 20 MG tablet      4 tablets (total 80 mg)  in the AM and 2 tablets (total 40mg ) in the afternoon   180 tablet   3     BP 115/65  Pulse 68  Temp(Src) 97.8 F (36.6 C) (Oral)  Resp 24  SpO2 98%  Physical Exam  Nursing note and vitals reviewed. Constitutional: He is oriented to person, place, and time. He appears well-developed and well-nourished.  Non-toxic appearance. No distress.  HENT:  Head: Normocephalic and atraumatic.  Eyes: Conjunctivae, EOM and lids are normal. Pupils are equal, round, and reactive to light.  Neck: Normal range of motion. Neck supple. No tracheal deviation present. No mass present.  Cardiovascular: Normal rate, regular rhythm and normal heart sounds.  Exam reveals no gallop.   No murmur heard. Pulmonary/Chest: Effort normal. No stridor. No respiratory distress. He has decreased breath sounds. He has no wheezes. He has rhonchi. He has no rales.  Abdominal: Soft. Normal appearance and bowel sounds are normal. He exhibits no distension. There is no tenderness. There is no rebound and no CVA tenderness.  Musculoskeletal: Normal range of motion. He exhibits no edema and no tenderness.  2 plus bilateral pitting edema  Neurological: He is alert and oriented to person, place, and time. He has normal strength. No cranial nerve deficit or sensory deficit. GCS eye subscore is 4. GCS verbal subscore is 5. GCS motor subscore is 6.  Skin: Skin is warm and dry. No abrasion and no rash noted.  Psychiatric: He has a normal mood and affect. His speech is normal and behavior is normal.    ED Course  Procedures (including critical care time)  Labs Reviewed  CBC  BASIC METABOLIC PANEL  PRO B NATRIURETIC PEPTIDE   No results found.   No diagnosis found.    MDM    Date: 12/20/2012  Rate: 92  Rhythm: normal sinus rhythm  QRS Axis: left  Intervals: normal  ST/T Wave abnormalities: normal  Conduction Disutrbances:right bundle branch block  Narrative  Interpretation:   Old EKG Reviewed: none available   7:19 PM Spoke with cards, will come to admit pt        Leota Jacobsen, MD 12/20/12 1919

## 2012-12-20 NOTE — Telephone Encounter (Signed)
New Problem:    Patient called in wanting to be admitted to the hospital because he is retaining too much water.  Please call back.

## 2012-12-21 DIAGNOSIS — J9589 Other postprocedural complications and disorders of respiratory system, not elsewhere classified: Secondary | ICD-10-CM

## 2012-12-21 LAB — BASIC METABOLIC PANEL
Calcium: 8.8 mg/dL (ref 8.4–10.5)
GFR calc non Af Amer: 61 mL/min — ABNORMAL LOW (ref >90)
Sodium: 141 mEq/L (ref 135–145)

## 2012-12-21 LAB — GLUCOSE, CAPILLARY
Glucose-Capillary: 99 mg/dL (ref 70–99)
Glucose-Capillary: 119 mg/dL — ABNORMAL HIGH (ref 70–99)
Glucose-Capillary: 112 mg/dL — ABNORMAL HIGH (ref 70–99)

## 2012-12-21 LAB — HEPATIC FUNCTION PANEL
Albumin: 2.7 g/dL — ABNORMAL LOW (ref 3.5–5.2)
Total Bilirubin: 0.6 mg/dL (ref 0.3–1.2)
Total Protein: 6.7 g/dL (ref 6.0–8.3)

## 2012-12-21 MED ORDER — IPRATROPIUM BROMIDE 0.02 % IN SOLN
0.5000 mg | Freq: Four times a day (QID) | RESPIRATORY_TRACT | Status: DC
  Administered 2012-12-21 – 2012-12-25 (×16): 0.5 mg via RESPIRATORY_TRACT
  Filled 2012-12-21 (×17): qty 2.5

## 2012-12-21 MED ORDER — ALBUTEROL SULFATE (5 MG/ML) 0.5% IN NEBU: 2.5000 mg | INHALATION_SOLUTION | Freq: Four times a day (QID) | RESPIRATORY_TRACT | Status: DC

## 2012-12-21 MED ORDER — ALBUTEROL SULFATE (5 MG/ML) 0.5% IN NEBU
2.5000 mg | INHALATION_SOLUTION | Freq: Four times a day (QID) | RESPIRATORY_TRACT | Status: DC
  Administered 2012-12-21 – 2012-12-25 (×16): 2.5 mg via RESPIRATORY_TRACT
  Filled 2012-12-21 (×17): qty 0.5

## 2012-12-21 MED ORDER — HYDROCORTISONE 1 % EX CREA
TOPICAL_CREAM | Freq: Four times a day (QID) | CUTANEOUS | Status: DC
  Administered 2012-12-21 (×3): via TOPICAL
  Administered 2012-12-22: 1 via TOPICAL
  Administered 2012-12-22 – 2012-12-23 (×5): via TOPICAL
  Administered 2012-12-24: 1 via TOPICAL
  Administered 2012-12-24 – 2012-12-26 (×7): via TOPICAL
  Filled 2012-12-21 (×2): qty 28

## 2012-12-21 MED ORDER — IPRATROPIUM BROMIDE 0.02 % IN SOLN
RESPIRATORY_TRACT | Status: AC
  Administered 2012-12-21: 0.5 mg
  Filled 2012-12-21: qty 2.5

## 2012-12-21 MED ORDER — IPRATROPIUM BROMIDE 0.02 % IN SOLN: 0.5000 mg | Freq: Four times a day (QID) | RESPIRATORY_TRACT | Status: DC

## 2012-12-21 MED ORDER — ALBUTEROL SULFATE (5 MG/ML) 0.5% IN NEBU
INHALATION_SOLUTION | RESPIRATORY_TRACT | Status: AC
  Administered 2012-12-21: 2.5 mg
  Filled 2012-12-21: qty 0.5

## 2012-12-21 NOTE — Progress Notes (Signed)
Pt has placed himself on home CPAP with no complications. [employer] has been notified

## 2012-12-21 NOTE — Progress Notes (Signed)
Pt. Placed cpap on himself. Pt. Is currently tolerating well. Pt. Has 2L of oxygen titrated into the cpap. RT connected the 02 for pt.

## 2012-12-21 NOTE — Progress Notes (Signed)
Patient ID: AASHISH ECKHART, male   DOB: 1941-04-24, 72 y.o.   MRN: [id number]    Subjective:  Dysnpnea  Objective:  Filed Vitals:   12/20/12 2100 12/20/12 2145 12/20/12 2320 12/21/12 0500  BP: 129/73 115/63 116/81 100/66  Pulse: 102 105 85 99  Temp:   98.3 F (36.8 C) 98 F (36.7 C)  TempSrc:   Oral Oral  Resp: 30 27 24 24   Height:      Weight:   209 lb 3.5 oz (94.9 kg) 208 lb 8.9 oz (94.6 kg)  SpO2: 97% 92% 96% 92%    Intake/Output from previous day:  Intake/Output Summary (Last 24 hours) at 12/21/12 0804 Last data filed at 12/21/12 [id number]  Gross per 24 hour  Intake    660 ml  Output   2625 ml  Net  -1965 ml    Physical Exam: Affect appropriate Obese white male HEENT: normal Neck supple with no adenopathy JVP normal no bruits no thyromegaly Lungs wheezing and good diaphragmatic motion Heart:  S1/S2 SEM no AR murmur, no rub, gallop or click PMI normal Abdomen: benighn, BS positve, no tenderness, no AAA no bruit.  No HSM or HJR Distal pulses intact with no bruits Plus one edema Neuro non-focal Skin warm and dry No muscular weakness   Lab Results: Basic Metabolic Panel:  Recent Labs  12/18/12 1102 12/20/12 1818  NA 138 138  K 3.3* 4.2  CL 99 102  CO2 23 27  GLUCOSE 145* 123*  BUN 31* 36*  CREATININE 1.2 1.31  CALCIUM 8.6 9.0   CBC:  Recent Labs  12/20/12 1818  WBC 5.7  HGB 9.6*  HCT 30.8*  MCV 87.0  PLT 244     Imaging: Dg Chest 2 View  12/20/2012   *RADIOLOGY REPORT*  Clinical Data: Short of breath, fluid retention  CHEST - 2 VIEW  Comparison: Chest radiograph 12/18/2012  Findings: Sternotomy wires overlie normal cardiac silhouette. There is bilateral air space disease not changed from prior.  Trace pleural effusions are noted.  No focal consolidation.  IMPRESSION: Bilateral fine air space disease suggests pulmonary edema.  Cannot exclude atypical infection.   Original Report Authenticated By: Suzy Bouchard, M.D.    Cardiac  Studies:  ECG:  SR RBBB LAD septal infarct rate 92   Telemetry:  NSR occasional PVC;s 12/21/2012   Echo:   Medications:   . aspirin EC  325 mg Oral Daily  . atorvastatin  80 mg Oral Daily  . carvedilol  3.125 mg Oral BID  . furosemide  80 mg Intravenous Q8H  . heparin  5,000 Units Subcutaneous Q8H  . insulin aspart  0-9 Units Subcutaneous TID WC  . linagliptin  5 mg Oral Daily  . lisinopril  2.5 mg Oral Daily  . pantoprazole  40 mg Oral Daily  . potassium chloride  40 mEq Oral Daily  . sodium chloride  3 mL Intravenous Q12H       Assessment/Plan:  CHF:  BNP only 225 and not grossly volume overloaded.  Decrease lasix to iv bid Dyspnea:  I think this is more pulmonary with sequelae from H1N1 infection in January. CXR with interstitial disease and no frank pulmonary edema Will have pulmonary see.   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

## 2012-12-21 NOTE — Progress Notes (Signed)
Raised areas on back and neck, red in color.  C/o itching, health care provider notified and cream applied as ordered.  Blood blisters noted on left forearm and one on right forearm.  Scabs noted, per patient, " dog jumps on me breaking skin occasionally.

## 2012-12-22 LAB — GLUCOSE, CAPILLARY
Glucose-Capillary: 117 mg/dL — ABNORMAL HIGH (ref 70–99)
Glucose-Capillary: 100 mg/dL — ABNORMAL HIGH (ref 70–99)
Glucose-Capillary: 136 mg/dL — ABNORMAL HIGH (ref 70–99)

## 2012-12-22 LAB — BASIC METABOLIC PANEL
CO2: 28 mEq/L (ref 19–32)
Chloride: 99 mEq/L (ref 96–112)
Creatinine, Ser: 1.1 mg/dL (ref 0.50–1.35)
Glucose, Bld: 122 mg/dL — ABNORMAL HIGH (ref 70–99)

## 2012-12-22 MED ORDER — CLONAZEPAM 0.5 MG PO TABS
0.5000 mg | ORAL_TABLET | Freq: Two times a day (BID) | ORAL | Status: DC | PRN
  Administered 2012-12-22: 0.5 mg via ORAL
  Filled 2012-12-22 (×2): qty 1

## 2012-12-22 MED ORDER — POTASSIUM CHLORIDE CRYS ER 20 MEQ PO TBCR
40.0000 meq | EXTENDED_RELEASE_TABLET | Freq: Once | ORAL | Status: AC
  Administered 2012-12-22: 40 meq via ORAL

## 2012-12-22 NOTE — Progress Notes (Signed)
Rounding given to the pt, pt resting comfortable on CPAP.

## 2012-12-22 NOTE — Progress Notes (Signed)
Utilization Review Completed.[name] T6/22/2014  

## 2012-12-22 NOTE — Progress Notes (Signed)
Patient had a headache 5 out of 10 this a.m., 650 mg Tylenol given PRN, headache improved significantly per pt.'s own words. See assessment documentation for further info.

## 2012-12-22 NOTE — Progress Notes (Signed)
Client's lower extremities become very red with cyanosis around toenail beds when he is up for any period of time.  Encouraged to keep legs elevated.

## 2012-12-22 NOTE — Progress Notes (Signed)
Patient ID: ROMALE SMAW, male   DOB: 1941/04/19, 72 y.o.   MRN: [id number]    Subjective:   Continues to diuresis well. Weight down another 2 pounds. No orthopnea or PND. Feels anxious.   Objective:  Filed Vitals:   12/22/12 1100 12/22/12 1205 12/22/12 1357 12/22/12 1431  BP: 98/64 98/64 89/61    Pulse: 102  79   Temp:   97.7 F (36.5 C)   TempSrc:   Oral   Resp:   20   Height:      Weight:      SpO2: 98%  94% 94%    Intake/Output from previous day:  Intake/Output Summary (Last 24 hours) at 12/22/12 1604 Last data filed at 12/22/12 1500  Gross per 24 hour  Intake   2096 ml  Output   2950 ml  Net   -854 ml    Physical Exam: Affect appropriate Obese white male lying fat in bed HEENT: normal Neck supple with no adenopathy JVP 8 no bruits no thyromegaly Lungs: clear with decreased BS throughout Heart:  S1/S2 2/6 SEM RSBmurmur, no rub, gallop or click  Abdomen: benighn, BS positve, no tenderness no bruit Ext: 1+ edema. Healing scars from SVG harvest.  Neuro non-focal Skin warm and dry No muscular weakness   Lab Results: Basic Metabolic Panel:  Recent Labs  12/21/12 0839 12/22/12 0605  NA 141 136  K 3.9 3.8  CL 101 99  CO2 30 28  GLUCOSE 181* 122*  BUN 31* 30*  CREATININE 1.18 1.10  CALCIUM 8.8 8.9   CBC:  Recent Labs  12/20/12 1818  WBC 5.7  HGB 9.6*  HCT 30.8*  MCV 87.0  PLT 244     Imaging: Dg Chest 2 View  12/20/2012   *RADIOLOGY REPORT*  Clinical Data: Short of breath, fluid retention  CHEST - 2 VIEW  Comparison: Chest radiograph 12/18/2012  Findings: Sternotomy wires overlie normal cardiac silhouette. There is bilateral air space disease not changed from prior.  Trace pleural effusions are noted.  No focal consolidation.  IMPRESSION: Bilateral fine air space disease suggests pulmonary edema.  Cannot exclude atypical infection.   Original Report Authenticated By: Suzy Bouchard, M.D.    Cardiac Studies:  ECG:  SR RBBB LAD septal  infarct rate 92   Telemetry:  NSR occasional PVC;s 12/22/2012   Echo:   Medications:   . albuterol  2.5 mg Nebulization Q6H  . aspirin EC  325 mg Oral Daily  . atorvastatin  80 mg Oral Daily  . carvedilol  3.125 mg Oral BID  . furosemide  80 mg Intravenous Q8H  . heparin  5,000 Units Subcutaneous Q8H  . hydrocortisone cream   Topical Q6H  . insulin aspart  0-9 Units Subcutaneous TID WC  . ipratropium  0.5 mg Nebulization Q6H  . linagliptin  5 mg Oral Daily  . lisinopril  2.5 mg Oral Daily  . pantoprazole  40 mg Oral Daily  . potassium chloride  40 mEq Oral Daily  . potassium chloride  40 mEq Oral Once  . sodium chloride  3 mL Intravenous Q12H       Assessment:  1. Acute on diastolic CHF:   2. CAD: Status post CABG  3. 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 4. 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 He is not a candidate for stent grafting. He will need to stabilize out/recover from CABG-AVR prior to vascular surgery. He will followup with Dr. Kellie Simmering  in July.  5. Anemia of chronic disease: will follow. 6. Acute on chronic resp failure 7. Anxiety 8. DM2  Plan/Discussion:   I suspect his dyspnea is multifactorial but seems to be getting better with diuresis. Weight coming down. Now 206. At recent d/c was about 195 but that was after prolonged illness. Will continue to diurese as tolerated. Possibly home in am. Will resume clonazepam for anxiety. Assess for home O2 with hall walk prior to d/c.  Daniel Bensimhon 12/22/2012, 4:04 PM

## 2012-12-23 ENCOUNTER — Ambulatory Visit: Payer: BC Managed Care – PPO | Admitting: Nurse Practitioner

## 2012-12-23 LAB — BASIC METABOLIC PANEL
Calcium: 9.5 mg/dL (ref 8.4–10.5)
Creatinine, Ser: 1.08 mg/dL (ref 0.50–1.35)
GFR calc Af Amer: 78 mL/min — ABNORMAL LOW (ref >90)
Sodium: 137 mEq/L (ref 135–145)

## 2012-12-23 LAB — GLUCOSE, CAPILLARY: Glucose-Capillary: 121 mg/dL — ABNORMAL HIGH (ref 70–99)

## 2012-12-23 NOTE — Progress Notes (Signed)
Page MD for hold parameter for ACE-I. MD order hold for SBP >90. Up dated in [facility].

## 2012-12-23 NOTE — Progress Notes (Signed)
Pt HR alarm >140 around 8:35-8:40. Hr would go down then back up after Pt bath. Pt was resting in bed. Pt states he felt fine. EKG monitor should usually rhythm. Change out EKG monitor pads, pt rhythm appears to be NSR. Hr 90-110. Will continue to monitor

## 2012-12-23 NOTE — Progress Notes (Signed)
Pt. Complaining of a headache this morning.  He states that tylenol has not been working and he takes ibuprofen at home.  Dr. Wynonia Lawman on call made aware who stated that CHF patients shouldn't have ibuprofen and he can have aspirin.  The patient was made aware of this, however he requested the tylenol instead.  Given to patient as ordered.  Will continue to monitor.

## 2012-12-23 NOTE — Progress Notes (Signed)
Patient ID: [name], male   DOB: 1941-03-29, 72 y.o.   MRN: [id number]    Subjective:   Continues to diuresis well. Weight down another 2 pounds. No orthopnea or PND. Breathing better but still some dyspnea.   Objective:  Filed Vitals:   12/22/12 1958 12/22/12 2140 12/22/12 2351 12/23/12 0554  BP:  110/68  98/64  Pulse:  99 94 104  Temp:  98.1 F (36.7 C)  97.9 F (36.6 C)  TempSrc:  Oral  Oral  Resp:  20 20 20   Height:      Weight:    204 lb 11.2 oz (92.851 kg)  SpO2: 93% 96% 96% 91%    Intake/Output from previous day:  Intake/Output Summary (Last 24 hours) at 12/23/12 [id number] Last data filed at 12/23/12 0212  Gross per 24 hour  Intake   1640 ml  Output   2750 ml  Net  -1110 ml    Physical Exam: Affect appropriate Obese white male lying fat in bed HEENT: normal Neck supple with no adenopathy JVP 8-9 cm no bruits no thyromegaly Lungs: clear with decreased BS throughout Heart:  S1/S2 2/6 SEM RSBmurmur, no rub, gallop or click  Abdomen: benighn, BS positve, no tenderness no bruit Ext: 1+ ankle edema. Healing scars from SVG harvest.  Neuro non-focal Skin warm and dry No muscular weakness   Lab Results: Basic Metabolic Panel:  Recent Labs  12/22/12 0605 12/23/12 0510  NA 136 137  K 3.8 4.5  CL 99 101  CO2 28 27  GLUCOSE 122* 118*  BUN 30* 31*  CREATININE 1.10 1.08  CALCIUM 8.9 9.5   CBC:  Recent Labs  12/20/12 1818  WBC 5.7  HGB 9.6*  HCT 30.8*  MCV 87.0  PLT 244     Imaging: No results found.  Cardiac Studies:  ECG:  SR RBBB LAD septal infarct rate 92   Telemetry:  NSR occasional PVC;s 12/23/2012   Medications:   . albuterol  2.5 mg Nebulization Q6H  . aspirin EC  325 mg Oral Daily  . atorvastatin  80 mg Oral Daily  . carvedilol  3.125 mg Oral BID  . furosemide  80 mg Intravenous Q8H  . heparin  5,000 Units Subcutaneous Q8H  . hydrocortisone cream   Topical Q6H  . insulin aspart  0-9 Units Subcutaneous TID WC  .  ipratropium  0.5 mg Nebulization Q6H  . linagliptin  5 mg Oral Daily  . lisinopril  2.5 mg Oral Daily  . pantoprazole  40 mg Oral Daily  . potassium chloride  40 mEq Oral Daily  . sodium chloride  3 mL Intravenous Q12H       Assessment:  1. Acute on diastolic CHF: EF 99991111 on 4/14 echo.   2. CAD: Status post CABG  3. 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 4. 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 He is not a candidate for stent grafting. He will need to stabilize out/recover from CABG-AVR prior to vascular surgery. He will followup with Dr. Kellie Simmering in July.  5. Anemia of chronic disease: Stable. 6. Acute on chronic resp failure 7. Anxiety 8. DM2  Plan/Discussion:   Continues to diurese but still volume up.  I will continue IV Lasix another day.  Possible transition to torsemide 60 mg po bid and home tomorrow but will need to reassess in am.    Loralie Champagne 12/23/2012, 8:33 AM

## 2012-12-23 NOTE — Progress Notes (Signed)
Pt destat to 85-86 at time while rest in bed on 3L, put  PT on 4l will continue to monitor

## 2012-12-23 NOTE — Progress Notes (Signed)
SATURATION QUALIFICATIONS: (This note is used to comply with regulatory documentation for home oxygen)   Patient Saturations on  2 Liters of oxygen while Ambulating = 86%  Please briefly explain why patient needs home oxygen: Pt 89% on 3L Total 652ft: Pt 320ft before Pt stop to rest. Pt walk 300 ft back to room. Pt mild dyspnea when ambulating

## 2012-12-23 NOTE — Progress Notes (Signed)
Pt on continuous O2 monitor per MD order . PT tends to Destat when walking to bathroom into low 80s on 2l. Put Pt on 3l. Will continue to monitor

## 2012-12-23 NOTE — Progress Notes (Signed)
Pt places self on and off home CPAP. Pt is wearing home full face mask with 4 LPM bleed in. Pt is comfortable and made aware to call if pt needs any assistance. RT will continue to monitor.

## 2012-12-23 NOTE — Progress Notes (Addendum)
Pt still remains between 88-91% on 4Lnc. Pt dropped down to 71% O2 sat on room air while changing over to CPAP for the night. Will continue to monitor pt and wean off 4L to 3L [place] if pt O2 sat allows Shelby Mattocks, RN 12/23/2012

## 2012-12-24 DIAGNOSIS — J449 Chronic obstructive pulmonary disease, unspecified: Secondary | ICD-10-CM

## 2012-12-24 DIAGNOSIS — I5033 Acute on chronic diastolic (congestive) heart failure: Secondary | ICD-10-CM

## 2012-12-24 DIAGNOSIS — J8410 Pulmonary fibrosis, unspecified: Secondary | ICD-10-CM

## 2012-12-24 DIAGNOSIS — I509 Heart failure, unspecified: Secondary | ICD-10-CM

## 2012-12-24 LAB — BASIC METABOLIC PANEL
CO2: 30 mEq/L (ref 19–32)
Chloride: 99 mEq/L (ref 96–112)
Potassium: 4.1 mEq/L (ref 3.5–5.1)
Sodium: 137 mEq/L (ref 135–145)

## 2012-12-24 LAB — GLUCOSE, CAPILLARY
Glucose-Capillary: 133 mg/dL — ABNORMAL HIGH (ref 70–99)
Glucose-Capillary: 96 mg/dL (ref 70–99)

## 2012-12-24 NOTE — Progress Notes (Signed)
Patient ID: [name], male   DOB: [birth year], 72 y.o.   MRN: [id number]    Subjective:   Continues to diuresis well. Weight down another 2 pounds. Still mildly tachypneic and dropping oxygen saturation with ambulation.  Objective:  Filed Vitals:   12/23/12 2055 12/23/12 2101 12/24/12 0232 12/24/12 0550  BP: 91/65 94/64  95/67  Pulse: 115 102 107 114  Temp: 97.6 F (36.4 C)   98.6 F (37 C)  TempSrc: Oral   Oral  Resp: 20 18 18 18   Height:      Weight:    202 lb 8 oz (91.853 kg)  SpO2:  91% 94% 93%    Intake/Output from previous day:  Intake/Output Summary (Last 24 hours) at 12/24/12 0811 Last data filed at 12/24/12 0500  Gross per 24 hour  Intake    960 ml  Output   3325 ml  Net  -2365 ml    Physical Exam: Affect appropriate Obese white male lying fat in bed HEENT: normal Neck supple with no adenopathy JVP 8-9 cm no bruits no thyromegaly Lungs: clear with decreased BS throughout Heart:  S1/S2 2/6 SEM RSBmurmur, no rub, gallop or click  Abdomen: benign, BS positve, no tenderness no bruit Ext: 1+ ankle edema. Healing scars from SVG harvest.  Neuro non-focal Skin warm and dry No muscular weakness   Lab Results: Basic Metabolic Panel:  Recent Labs  12/23/12 0510 12/24/12 0410  NA 137 137  K 4.5 4.1  CL 101 99  CO2 27 30  GLUCOSE 118* 113*  BUN 31* 32*  CREATININE 1.08 1.16  CALCIUM 9.5 8.9   CBC: No results found for this basename: WBC, NEUTROABS, HGB, HCT, MCV, PLT,  in the last 72 hours   Imaging: No results found.  Cardiac Studies:  ECG:  SR RBBB LAD septal infarct rate 92   Telemetry:  NSR occasional PVC;s 12/24/2012   Medications:   . albuterol  2.5 mg Nebulization Q6H  . aspirin EC  325 mg Oral Daily  . atorvastatin  80 mg Oral Daily  . carvedilol  3.125 mg Oral BID  . furosemide  80 mg Intravenous Q8H  . heparin  5,000 Units Subcutaneous Q8H  . hydrocortisone cream   Topical Q6H  . insulin aspart  0-9 Units Subcutaneous TID  WC  . ipratropium  0.5 mg Nebulization Q6H  . linagliptin  5 mg Oral Daily  . lisinopril  2.5 mg Oral Daily  . pantoprazole  40 mg Oral Daily  . potassium chloride  40 mEq Oral Daily  . sodium chloride  3 mL Intravenous Q12H       Assessment:  1. Acute on diastolic CHF: EF 99991111 on 4/14 echo.   2. CAD: Status post CABG  3. Pulmonary: Mixed restrictive (post-ARDS)/obstructive picture on prior CT.  Suspect significant intrinsic lung disease/scarring s/p ARDS episode earlier this year.  4. 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 He is not a candidate for stent grafting. He will need to stabilize out/recover from CABG-AVR prior to vascular surgery. He will followup with Dr. Kellie Simmering in July.  5. Anemia of chronic disease: Stable. 6. Acute on chronic resp failure 7. Anxiety 8. DM2  Plan/Discussion:   Continues to diurese but still volume up on my exam.  I will continue IV Lasix another day.  Possible transition to torsemide 60 mg po bid and home tomorrow but will need to reassess in am.    Still significant  hypoxemia, on 3 L [place] at rest with desaturation with ambulation.  There is a component of pulmonary edema but he has underlying lung disease post-ARDS (scarring with mixed restrictive/obstructive PFTs) so suspect he will have a baseline oxygen requirement.  Will consult pulmonary service for an evaluation of his underlying lung disease.   Loralie Champagne 12/24/2012, 8:11 AM

## 2012-12-24 NOTE — Plan of Care (Signed)
Problem: Phase I Progression Outcomes Goal: EF % per last Echo/documented,Core Reminder form on chart Per echo on 10/28/12, EF 50-55% Goal: Up in chair, BRP Outcome: Completed/Met Date Met:  12/24/12 Pt steady on feet, up ad lib to bathroom

## 2012-12-24 NOTE — Consult Note (Signed)
PULMONARY  / CRITICAL CARE MEDICINE  Name: [name] MRN: [id number] DOB: 03-22-1941    ADMISSION DATE:  12/20/2012 CONSULTATION DATE:  12/24/2012   REFERRING MD :  [name], Cardiology  CHIEF COMPLAINT:  Dyspnea, hypoxia   HISTORY OF PRESENT ILLNESS:  72 yo ex-heavy smoker with history of ARDS/respiratory failure and tracheostomy in 07/2012 as well as DM, HTN, and COPD on home O2 2L/m was admitted 6/20 1/4 for dyspnea, desaturation at 89% and weight gain up 17 lbs. CXR showed 6/19 Mild vascular congestion and small pleural effusions, unchanged from 12/03/12,   pBNP 2056. He has diuresed 7L since admission, but remains hypoxic, plan is to dc home soon on torsemide 60 mg po bid . Of note, he had a prolonged hospitalization in 1/14 with H1N1 influenza and pneumococcal PNA. This progressed to ARDS requiring  tracheostomy. He also had a left empyema requiring chest tube, septic shock with elevated troponin, and AKI which resolved. He was admitted again in 4/14 from his nursing home with exertional dyspnea and orthopnea.  Echo then showed EF 35% with global hypokinesis that looked worse in the anteroseptal wall. He had aortic stenosis rated as moderate to severe & severe 3 vessel disease on cath. He then had CABG-AVR (bioprosthetic valve). He was discharged on home O2.  Echo in 5/14 prior to discharge showed EF up to 50%.  He also uses CPAP during sleep & is compliant. He smoked 2-3 PPD prior to his admission in Jan but has quit since. 10/23/12 PFTs reveal severe obstructive / restrictive disease & severely reduced DLCO.         PAST MEDICAL HISTORY :  Past Medical History  Diagnosis Date  . Hypertension   . Hyperlipidemia   . OSA (obstructive sleep apnea)     on cpap  . Prediabetes   . CHF (congestive heart failure) 07/2012; 10/17/2012  . COPD (chronic obstructive pulmonary disease)     a.  History of heavy smoking. PFTs (4/14) with mixed obstructive (COPD) and restrictive (post-ARDS)  picture.   . Pneumonia 07/2012    PNA (H1N1 influenza + pneumococcus) in [id number] complicated by respiratory failure/ARDS. Required tracheostomy, now weaned off. Had left empyema requiring chest tube.   . Diabetes mellitus without complication   . History of blood transfusion     "lots since January" (10/17/2012)  . Anxiety   . Ischemic cardiomyopathy   . Anemia of chronic disease   . Prostate cancer      s/p radiation treatment. Has indwelling foley.   . Aortic stenosis     Moderate to severe by echo in 4/14. Bioprosthetic #21 [facility] Ease aortic valve replacement in 4/14.   Marland Kitchen CAD (coronary artery disease)     Moderate to severe by echo in 4/14. Bioprosthetic #21 [facility] Ease aortic valve replacement in 4/14.   . Carotid arterial disease     Carotid dopplers (0000000) with [id number] LICA stenosis.   Marland Kitchen AAA (abdominal aortic aneurysm)     5/14 CT showed > 6 cm AAA, also right iliac aneurysm. Not stent graft candidate.    Past Surgical History  Procedure Laterality Date  . Transurethral microwave therapy  10/15/2012  . Nasal fracture surgery  1970's  . Tracheostomy  07/2012  . Tracheostomy closure  08/2012  . Anal fissure repair  2008  . Coronary artery bypass graft N/A 10/25/2012    Procedure: CORONARY ARTERY BYPASS GRAFTING (CABG);  Surgeon: Melrose Nakayama, MD;  Location: [place];  Service: Open Heart Surgery;  Laterality: N/A;  times 4 using left internal mammary artery and endoscopically harvested bilateral saphenous vein   . Aortic valve replacement N/A 10/25/2012    Procedure: AORTIC VALVE REPLACEMENT (AVR);  Surgeon: Melrose Nakayama, MD;  Location: [place];  Service: Open Heart Surgery;  Laterality: N/A;  . Intraoperative transesophageal echocardiogram N/A 10/25/2012    Procedure: INTRAOPERATIVE TRANSESOPHAGEAL ECHOCARDIOGRAM;  Surgeon: Melrose Nakayama, MD;  Location: [place];  Service: Open Heart Surgery;  Laterality: N/A;   Prior to Admission medications   Medication Sig  Start Date End Date Taking? Authorizing Provider  aspirin EC 325 MG EC tablet Take 1 tablet (325 mg total) by mouth daily. 11/04/12  Yes Donielle Liston Alba, PA-C  atorvastatin (LIPITOR) 80 MG tablet Take 1 tablet (80 mg total) by mouth daily. 11/18/12  Yes Larey Dresser, MD  carvedilol (COREG) 3.125 MG tablet Take 1 tablet (3.125 mg total) by mouth 2 (two) times daily. 11/22/12  Yes Larey Dresser, MD  guaifenesin (GERI-TUSSIN) 100 MG/5ML syrup Take 300 mg by mouth every 4 (four) hours as needed for cough.   Yes Historical Provider, MD  ipratropium-albuterol (DUONEB) 0.5-2.5 (3) MG/3ML SOLN Take 3 mLs by nebulization every 6 (six) hours.   Yes Historical Provider, MD  lisinopril (PRINIVIL,ZESTRIL) 5 MG tablet Take 2.5 mg by mouth daily.   Yes Historical Provider, MD  pantoprazole (PROTONIX) 40 MG tablet Take 1 tablet by mouth daily. 12/17/12  Yes Historical Provider, MD  potassium chloride (K-DUR) 10 MEQ tablet Take 40 mEq by mouth daily.   Yes Historical Provider, MD  sitaGLIPtin (JANUVIA) 100 MG tablet Take 100 mg by mouth daily.   Yes Historical Provider, MD  torsemide (DEMADEX) 20 MG tablet 4 tablets (total 80 mg)  in the AM and 2 tablets (total 40mg ) in the afternoon 12/18/12  Yes Larey Dresser, MD   Allergies  Allergen Reactions  . Cephalosporins Rash    FAMILY HISTORY:  History reviewed. No pertinent family history. SOCIAL HISTORY:  reports that he quit smoking about 5 months ago. His smoking use included Cigarettes. He has a 116 pack-year smoking history. He has never used smokeless tobacco. He reports that he does not drink alcohol or use illicit drugs.  REVIEW OF SYSTEMS:   Constitutional: Negative for fever, chills, weight loss, malaise/fatigue and diaphoresis.  HENT: Negative for hearing loss, ear pain, nosebleeds, congestion, sore throat, neck pain, tinnitus and ear discharge.   Eyes: Negative for blurred vision, double vision, photophobia, pain, discharge and redness.   Respiratory: Negative for cough, hemoptysis, sputum production,, wheezing and stridor.   Cardiovascular: Negative for chest pain, palpitations,  Claudication Gastrointestinal: Negative for heartburn, nausea, vomiting, abdominal pain, diarrhea, constipation, blood in stool and melena.  Genitourinary: Negative for dysuria, urgency, frequency, hematuria and flank pain.  Musculoskeletal: Negative for myalgias, back pain, joint pain and falls.  Skin: Negative for itching and rash.  Neurological: Negative for dizziness, tingling, tremors, sensory change, speech change, focal weakness, seizures, loss of consciousness, weakness and headaches.  Endo/Heme/Allergies: Negative for environmental allergies and polydipsia. Does not bruise/bleed easily.  SUBJECTIVE:   VITAL SIGNS: Temp:  [97.5 F (36.4 C)-98.6 F (37 C)] 98.4 F (36.9 C) (06/24 1421) Pulse Rate:  [99-115] 99 (06/24 1421) Resp:  [18-20] 18 (06/24 1421) BP: (91-100)/(58-67) 95/63 mmHg (06/24 1421) SpO2:  [91 %-99 %] 95 % (06/24 1548) Weight:  [202 lb 8 oz (91.853 kg)] 202 lb 8 oz (91.853 kg) (06/24 0550)  PHYSICAL EXAMINATION: Gen. Pleasant, well-nourished, in no distress, normal affect ENT - no lesions, no post nasal drip Neck: No JVD, no thyromegaly, no carotid bruits Lungs: no use of accessory muscles, no dullness to percussion, bibasal coarse rales, no rhonchi  Cardiovascular: Rhythm regular, heart sounds  normal, no murmurs, 1+ peripheral edema Abdomen: soft and non-tender, no hepatosplenomegaly, BS normal. Musculoskeletal: No deformities, no cyanosis or clubbing Neuro:  alert, non focal Skin:  Warm, no lesions/ rash    Recent Labs Lab 12/22/12 0605 12/23/12 0510 12/24/12 0410  NA 136 137 137  K 3.8 4.5 4.1  CL 99 101 99  CO2 28 27 30   BUN 30* 31* 32*  CREATININE 1.10 1.08 1.16  GLUCOSE 122* 118* 113*    Recent Labs Lab 12/20/12 1818  HGB 9.6*  HCT 30.8*  WBC 5.7  PLT 244   No results  found.  ASSESSMENT / PLAN:  He has moderate to severe pulmonary impairment with mixed obst and rest physiology due to COPD and post-infectious (H1N1) pulmonary fibrosis - Note coarse crackles on exam. I wold place him back on duonebs on discharge & consider adding LABA/LAMA as outpt (FU has been arranged) Encourage compliance with O2 on exertion - OK to resume cardiac rehab at [place]. He will also blend in O2 with his CPAP during sleep He is tolerating lisinopril & coreg well at this time without any brochospasm  Diuresis should be weight based & goal should be to attain dry weight.  Rigoberto Noel  [facility] Pager: [phone]  12/24/2012, 5:13 PM

## 2012-12-24 NOTE — Progress Notes (Signed)
Call received from central monitor reading 84-88.  Pt stated that he had taken off his 02 to go to the bathroom and had just put it  Back on.  Recheck 002 92% on 4L. Instructed pt to use o2 sat as he has a long extension cord and to call if he need assistance.  Pt verbalized understanding.  Will continue to monitor.  Karie Kirks, Therapist, sports.

## 2012-12-24 NOTE — Progress Notes (Signed)
O2 sat reading 86-88 on CPAP with 4Lnc, pt assessed and O2 sat probe readjusted on finger, O2 sat reading 92-94 now.

## 2012-12-24 NOTE — Plan of Care (Signed)
Problem: Phase I Progression Outcomes Goal: EF % per last Echo/documented,Core Reminder form on chart Outcome: Completed/Met Date Met:  12/24/12 Per Echo on 10/28/12 pt EF 50-55%

## 2012-12-25 DIAGNOSIS — I2510 Atherosclerotic heart disease of native coronary artery without angina pectoris: Secondary | ICD-10-CM | POA: Diagnosis present

## 2012-12-25 DIAGNOSIS — Z951 Presence of aortocoronary bypass graft: Secondary | ICD-10-CM

## 2012-12-25 DIAGNOSIS — I951 Orthostatic hypotension: Secondary | ICD-10-CM

## 2012-12-25 LAB — BASIC METABOLIC PANEL
Chloride: 97 mEq/L (ref 96–112)
Creatinine, Ser: 1.22 mg/dL (ref 0.50–1.35)
GFR calc Af Amer: 68 mL/min — ABNORMAL LOW (ref >90)
Potassium: 4.9 mEq/L (ref 3.5–5.1)

## 2012-12-25 LAB — GLUCOSE, CAPILLARY: Glucose-Capillary: 113 mg/dL — ABNORMAL HIGH (ref 70–99)

## 2012-12-25 MED ORDER — TORSEMIDE 20 MG PO TABS
40.0000 mg | ORAL_TABLET | Freq: Every day | ORAL | Status: DC
  Administered 2012-12-25: 40 mg via ORAL
  Filled 2012-12-25 (×2): qty 2

## 2012-12-25 MED ORDER — TORSEMIDE 20 MG PO TABS
80.0000 mg | ORAL_TABLET | Freq: Every day | ORAL | Status: DC
  Administered 2012-12-25 – 2012-12-26 (×2): 80 mg via ORAL
  Filled 2012-12-25 (×2): qty 4

## 2012-12-25 NOTE — Progress Notes (Addendum)
SBP >90 (84) Pt O4x no complaints, states no dizz or light headness. Pt states he wants to go for a walk when his wife arrives. Will recheck  B/t 1630-1730. Will  Continue to monitor

## 2012-12-25 NOTE — Progress Notes (Signed)
Pt up in bed eating breakfast, no complains of CP. Pt still has dyspnea with exertion. Pt on 4L of O2

## 2012-12-25 NOTE — Progress Notes (Signed)
Pt. Stated that he would place himself on CPAP when ready for bed. Pt. Has his home CPAP & mask set up at the bedside. Pt. Was made aware to call RT if he needed assistance with CPAP.

## 2012-12-25 NOTE — Progress Notes (Signed)
Patient ID: [name], male   DOB: 01-01-1941, 72 y.o.   MRN: [id number]   SUBJECTIVE:  Patient is diuresing. Creatinine is up slightly. He is ambulating some. However he is tired by the time he reaches the bathroom. Appreciate help from pulmonary. We'll plan for pulmonary followup post hospital in addition to early cardiac followup. His physical exam findings as they relate to the lungs are probably related to his pulmonary disease. He hopes to go home soon.    Filed Vitals:   12/24/12 2015 12/24/12 2042 12/25/12 0422 12/25/12 0732  BP:  98/66 93/65   Pulse:  99 105   Temp:  97.2 F (36.2 C) 97.3 F (36.3 C)   TempSrc:  Oral Oral   Resp:  18 20   Height:      Weight:   202 lb 9.6 oz (91.9 kg)   SpO2: 92% 92% 94% 94%    Intake/Output Summary (Last 24 hours) at 12/25/12 0749 Last data filed at 12/25/12 0526  Gross per 24 hour  Intake   1320 ml  Output   2600 ml  Net  -1280 ml    LABS: Basic Metabolic Panel:  Recent Labs  12/24/12 0410 12/25/12 0500  NA 137 135  K 4.1 4.9  CL 99 97  CO2 30 30  GLUCOSE 113* 112*  BUN 32* 39*  CREATININE 1.16 1.22  CALCIUM 8.9 9.4   Liver Function Tests: No results found for this basename: AST, ALT, ALKPHOS, BILITOT, PROT, ALBUMIN,  in the last 72 hours No results found for this basename: LIPASE, AMYLASE,  in the last 72 hours CBC: No results found for this basename: WBC, NEUTROABS, HGB, HCT, MCV, PLT,  in the last 72 hours Cardiac Enzymes: No results found for this basename: CKTOTAL, CKMB, CKMBINDEX, TROPONINI,  in the last 72 hours BNP: No components found with this basename: POCBNP,  D-Dimer: No results found for this basename: DDIMER,  in the last 72 hours Hemoglobin A1C: No results found for this basename: HGBA1C,  in the last 72 hours Fasting Lipid Panel: No results found for this basename: CHOL, HDL, LDLCALC, TRIG, CHOLHDL, LDLDIRECT,  in the last 72 hours Thyroid Function Tests: No results found for this basename:  TSH, T4TOTAL, FREET3, T3FREE, THYROIDAB,  in the last 72 hours  RADIOLOGY: Dg Chest 2 View  12/20/2012   *RADIOLOGY REPORT*  Clinical Data: Short of breath, fluid retention  CHEST - 2 VIEW  Comparison: Chest radiograph 12/18/2012  Findings: Sternotomy wires overlie normal cardiac silhouette. There is bilateral air space disease not changed from prior.  Trace pleural effusions are noted.  No focal consolidation.  IMPRESSION: Bilateral fine air space disease suggests pulmonary edema.  Cannot exclude atypical infection.   Original Report Authenticated By: Suzy Bouchard, M.D.   Dg Chest 2 View  12/18/2012   *RADIOLOGY REPORT*  Clinical Data: Short of breath  CHEST - 2 VIEW  Comparison: 12/03/2012  Findings: Prior CABG and aortic valve replacement.  Heart size is prominent.  Mild vascular congestion is unchanged.  Negative for edema or infiltrate.  Small pleural effusions bilaterally.  IMPRESSION: Mild vascular congestion and small pleural effusions, unchanged. Probable mild chronic congestive heart failure.   Original Report Authenticated By: Carl Best, M.D.   Dg Chest 2 View  12/03/2012   *RADIOLOGY REPORT*  Clinical Data: Follow up open heart surgery  CHEST - 2 VIEW  Comparison: 11/22/2012  Findings: Chronic interstitial markings/emphysematous changes.  No focal consolidation. No pleural effusion  or pneumothorax.  Mild cardiomegaly.  Prosthetic valve.  Postsurgical changes related to prior CABG.  Degenerative changes of the visualized thoracolumbar spine.  IMPRESSION: No evidence of acute cardiopulmonary disease.   Original Report Authenticated By: Julian Hy, M.D.    PHYSICAL EXAM   Patient is oriented to person time and place. Affect is normal. I spoke with him and his wife in the room. There is no jugulovenous distention. Cardiac exam reveals S1 and S2. Abdomen is soft. There is trace peripheral edema.   TELEMETRY: I have reviewed telemetry today, December 25, 2012. There is sinus rhythm. The  rate averages 85-90.   ASSESSMENT AND PLAN:    AAA (abdominal aortic aneurysm) without rupture     It is my understanding that this is to be repaired a later date.    Postinflammatory pulmonary fibrosis     Appreciate help from the pulmonary team.    Acute on chronic diastolic CHF (congestive heart failure)      The patient continues to diuresis. His resting heart rate is 85. He has good systolic function. Therefore the only reason to increase the beta blocker would be for heart rate. He is relatively hypotensive. In addition his elevated heart rate is probably secondary. I'm not able to adjust his meds any further at this time other than changes diuretics. He is on IV diuretics here. Today I am changing him back to the dosing of his home Demadex. If his renal is stable and his volume status is stable and he is stronger tomorrow he can go home with very early post hospital followup.    CAD (coronary artery disease)   Hx of CABG    Hypotension    He has relative hypotension. We are not able to push his medicines any harder.  Dola Argyle 12/25/2012 7:49 AM

## 2012-12-26 ENCOUNTER — Other Ambulatory Visit: Payer: BC Managed Care – PPO

## 2012-12-26 DIAGNOSIS — Z953 Presence of xenogenic heart valve: Secondary | ICD-10-CM

## 2012-12-26 DIAGNOSIS — J9620 Acute and chronic respiratory failure, unspecified whether with hypoxia or hypercapnia: Secondary | ICD-10-CM

## 2012-12-26 DIAGNOSIS — N289 Disorder of kidney and ureter, unspecified: Secondary | ICD-10-CM

## 2012-12-26 DIAGNOSIS — R0902 Hypoxemia: Secondary | ICD-10-CM

## 2012-12-26 DIAGNOSIS — F419 Anxiety disorder, unspecified: Secondary | ICD-10-CM

## 2012-12-26 DIAGNOSIS — E1159 Type 2 diabetes mellitus with other circulatory complications: Secondary | ICD-10-CM

## 2012-12-26 LAB — BASIC METABOLIC PANEL
BUN: 51 mg/dL — ABNORMAL HIGH (ref 6–23)
GFR calc non Af Amer: 44 mL/min — ABNORMAL LOW (ref >90)
Glucose, Bld: 120 mg/dL — ABNORMAL HIGH (ref 70–99)
Potassium: 4.8 mEq/L (ref 3.5–5.1)

## 2012-12-26 LAB — GLUCOSE, CAPILLARY: Glucose-Capillary: 131 mg/dL — ABNORMAL HIGH (ref 70–99)

## 2012-12-26 MED ORDER — CLONAZEPAM 0.5 MG PO TABS: 0.5000 mg | ORAL_TABLET | Freq: Two times a day (BID) | ORAL | Status: DC | PRN

## 2012-12-26 MED ORDER — ALBUTEROL SULFATE (5 MG/ML) 0.5% IN NEBU
2.5000 mg | INHALATION_SOLUTION | Freq: Three times a day (TID) | RESPIRATORY_TRACT | Status: DC
  Administered 2012-12-26: 2.5 mg via RESPIRATORY_TRACT
  Filled 2012-12-26: qty 0.5

## 2012-12-26 MED ORDER — NITROGLYCERIN 0.4 MG SL SUBL: 0.4000 mg | SUBLINGUAL_TABLET | SUBLINGUAL | Status: DC | PRN

## 2012-12-26 MED ORDER — TORSEMIDE 20 MG PO TABS: ORAL_TABLET | ORAL | Status: DC

## 2012-12-26 MED ORDER — ALBUTEROL SULFATE (5 MG/ML) 0.5% IN NEBU: 2.5000 mg | INHALATION_SOLUTION | Freq: Four times a day (QID) | RESPIRATORY_TRACT | Status: DC | PRN

## 2012-12-26 MED ORDER — IPRATROPIUM BROMIDE 0.02 % IN SOLN
0.5000 mg | Freq: Three times a day (TID) | RESPIRATORY_TRACT | Status: DC
  Administered 2012-12-26: 0.5 mg via RESPIRATORY_TRACT
  Filled 2012-12-26: qty 2.5

## 2012-12-26 NOTE — Discharge Summary (Signed)
Discharge Summary   Patient ID: [name],  MRN: [id number], DOB/AGE: 11/29/1940 72 y.o.  Admit date: 12/20/2012 Discharge date: 12/26/2012  Primary Physician: [name], MD Primary Cardiologist: Einar Crow, MD  Discharge Diagnoses Principal Problem:   Acute-on-chronic respiratory failure  - Multifactorial secondary to A/C diastolic CHF, pulmonary fibrosis and COPD  - Follow-up pulmonary and PFTs scheduled for 01/20/13 Active Problems:   Postinflammatory pulmonary fibrosis   COPD (chronic obstructive pulmonary disease)  - DuoNebs to be resumed on discharge  - SABA/LABA to be added on follow-up with pulmonary next month (appointment scheduled)  - Discharged on home O2 and supplemental CPAP   Acute on chronic diastolic CHF (congestive heart failure)  - Diuresed with Lasix IV transitioned back to home Demadex regimen  - Total I/O: - 10,277 mL  - Admission weight: 213 lbs  - Discharge weight: 201 lbs  - Baseline weight: 195 - 200 lbs  - EF previously 35%, echo 4/28 with improvement to 50-55%  - Resumed on ACEi, BB   CAD (coronary artery disease)  - Stable- initial TnI WNL, no evidence of ischemia this admission  - Continued on ASA, ACEi, BB, statin  - NTG SL PRN added   Hx of CABG   History of aortic valve replacement with bioprosthetic valve  - Well-seated, functioning normally on 4/28 echo   Hypoxia  - On exertion and off O2  - To resume home O2 and CPAP as above   Acute renal insufficiency  - Cr trend 1.08=>1.16=>1.22=>1.53 today marking end point for IV diuresis  - Follow-up BMET tomorrow AM   Type 2 diabetes mellitus  - Follow-up PCP for further evaluation  - To resume home oral hypoglycemic   Aneurysm of right iliac artery  - Follow-up with Dr. Kellie Simmering 12/31/12 as previously scheduled   Orthostatic hypotension  - Stable   Anxiety  - Clonazepam prescribed on discharge  Allergies Allergies  Allergen Reactions  . Cephalosporins Rash    Diagnostic  Studies/Procedures  PA/LATERAL CHEST X-RAY - 12/20/12  IMPRESSION:  Bilateral fine air space disease suggests pulmonary edema. Cannot  exclude atypical infection.  History of Present Illness [name] is a 72 y.o. male who was admitted to [facility] on 12/20/12 with the above problem list.   He has a history of AAA, chronic combined CHF (EF recent improved from 35% to 50-55%), CAD & h/o AS s/p CABG & bioprosthetic AVR, ARDS/chronic respiratory failure- postinflammatory pulmonary fibrosis and COPD- DM2, HTN and COPD.   He he had had a prolonged hospitalization in 1/14 with H1N1 influenza and pneumococcal PNA progressing to ARDS. He was intubated and received a tracheostomy. This has since been removed. He also had a left empyema requiring chest tube, septic shock with elevated troponin, and AKI which resolved. He was admitted again in 4/14 from his nursing home with CHF- exertional dyspnea, orthopnea and weight gain of 21 lbs. Echo at admission showed EF 35% with global hypokinesis that looked worse in the anteroseptal wall. He also was noted to have aortic stenosis rated as moderate to severe. He was diuresed and cathed, showing severe 3 vessel disease. He then had CABG-AVR (bioprosthetic valve). He did well post-op considering his comorbidities and has returned home with his wife. Echo prior to discharge showed EF up to 50% with well-seated aortic valve.   He followed up with Dr. [name] on 12/18/12 where he was noted to be hypoxic (O2 sat 89%) with a 17 lbs weight gain.  Despite the strong advise to be admitted, the patient elected for outpatient diuretics to be up-titrated. He initially improved with a 2 lbs weight loss, however regained 3 lbs thereafter. Given his worsening heart failure symptoms, he presented to the ED. pBNP was noted to be up from 225 on follow-up to 2056. CXR as above indicated bilateral fine airspace disease vs pulmonary edema. The decision was made to admit the  patient for IV diuresis.   Hospital Course   He was started on Lasix IV which resulted in considerable output, yet only minimal respiratory improvement. The patient's weight continued to trend down, however continued to retain a small amount of volume thus extending his admission. Clonazepam was added for anxiety and the pulmonary service was consulted given the patient's restrictive and obstructive underlying lung disease. DuoNebs were restarted. Home O2, supplemental CPAP and cardiac rehab were recommended. He was eventually transitioned to his home PO Demadex regimen.   He was evaluated by Dr. Percival Spanish this morning who deemed the patient stable for discharge. Of note, BMET this AM did reveal a Cr spike likely secondary to diuresis. Home Demadex will be resumed and a repeat BMET will be drawn tomorrow. He has a previously scheduled follow-up appointment with Dr. [name] on 01/02/13-- this was switched to a transitional care appointment. Additionally, follow-up with pulmonary has been made where the addition of a SABA/LABA will be considered. He will follow-up with Dr. Kellie Simmering 12/31/12 for continued monitoring of the patient's previously diagnosed AAA. This information, including supplemental heart and respiratory failure instructions, has been clearly outlined in the discharge AVS.   Discharge Vitals:  Blood pressure 98/67, pulse 97, temperature 97.7 F (36.5 C), temperature source Oral, resp. rate 20, height 5\' 6"  (1.676 m), weight 91.5 kg (201 lb 11.5 oz), SpO2 93.00%.   Labs:  Recent Labs Lab 12/21/12 0839  12/24/12 0410 12/25/12 0500 12/26/12 0620  NA 141  < > 137 135 134*  K 3.9  < > 4.1 4.9 4.8  CL 101  < > 99 97 97  CO2 30  < > 30 30 28   BUN 31*  < > 32* 39* 51*  CREATININE 1.18  < > 1.16 1.22 1.53*  CALCIUM 8.8  < > 8.9 9.4 9.4  PROT 6.7  --   --   --   --   BILITOT 0.6  --   --   --   --   ALKPHOS 89  --   --   --   --   ALT 15  --   --   --   --   AST 13  --   --   --   --     GLUCOSE 181*  < > 113* 112* 120*  < > = values in this interval not displayed.  Disposition:  Discharge Orders   Future Appointments Provider Department Dept Phone   12/27/2012 8:50 AM [facility] [place]) [phone]   12/31/2012 1:50 PM [facility] [place] [facility] [name] [id number]   12/31/2012 3:45 PM Mal Misty, MD [facility] [phone]   01/02/2013 3:00 PM Larey Dresser, MD [place]) [phone]   01/20/2013 3:00 PM [facility] [place] [facility] [phone]   01/20/2013 4:00 PM Melvenia Needles, NP [facility] [phone]   02/03/2013 10:30 AM [facility] [place] [facility] [phone]   Future Orders  Complete By Expires     Diet - low sodium heart healthy  As directed     Increase activity slowly  As directed           Follow-up Information   Follow up with PARRETT,TAMMY, NP On 01/20/2013. (for breathing test 3PM & appt at 4pm)    Contact information:   [address] [address] [phone]       Follow up with [facility] On 12/27/2012. (Please arrive between 8:00 AM and 4:30 PM for labwork. )    Contact information:   [place] [phone]       Follow up with Loralie Champagne, MD On 01/02/2013. (At 3:00 PM for previously scheduled follow-up. )    Contact information:   [address] [place] [address] [phone]       Discharge Medications:    Medication List    TAKE these medications       aspirin 325 MG EC tablet  Take 1 tablet (325 mg total) by mouth daily.     atorvastatin 80 MG tablet  Commonly known as:  LIPITOR  Take 1 tablet (80 mg total) by mouth daily.     carvedilol 3.125 MG tablet  Commonly known as:  COREG  Take 1 tablet (3.125 mg total) by mouth 2 (two) times daily.      clonazePAM 0.5 MG tablet  Commonly known as:  KLONOPIN  Take 1 tablet (0.5 mg total) by mouth 2 (two) times daily as needed (anxiety).     GERI-TUSSIN 100 MG/5ML syrup  Generic drug:  guaifenesin  Take 300 mg by mouth every 4 (four) hours as needed for cough.     ipratropium-albuterol 0.5-2.5 (3) MG/3ML Soln  Commonly known as:  DUONEB  Take 3 mLs by nebulization every 6 (six) hours.     lisinopril 5 MG tablet  Commonly known as:  PRINIVIL,ZESTRIL  Take 2.5 mg by mouth daily.     nitroGLYCERIN 0.4 MG SL tablet  Commonly known as:  NITROSTAT  Place 1 tablet (0.4 mg total) under the tongue every 5 (five) minutes as needed for chest pain.     pantoprazole 40 MG tablet  Commonly known as:  PROTONIX  Take 1 tablet by mouth daily.     potassium chloride 10 MEQ tablet  Commonly known as:  K-DUR  Take 40 mEq by mouth daily.     sitaGLIPtin 100 MG tablet  Commonly known as:  JANUVIA  Take 100 mg by mouth daily.     torsemide 20 MG tablet  Commonly known as:  DEMADEX  4 tablets (total 80 mg)  in the AM and 2 tablets (total 40mg ) in the afternoon       Outstanding Labs/Studies: BMET 12/27/12, repeat echo 02/03/13, PFTs 01/20/13  Duration of Discharge Encounter: Greater than 30 minutes including physician time.  Signed, R. Valeria Batman, PA-C 12/26/2012, 11:05 AM    Patient seen and examined.  Plan as discussed in my rounding note for today and outlined above. Jeneen Rinks [facility]  12/27/2012  7:23 AM

## 2012-12-26 NOTE — Telephone Encounter (Signed)
New problem   Pt has 7day TCM w/Dr [name] per Nicole Kindred PA calling.

## 2012-12-26 NOTE — Progress Notes (Signed)
Pt up in chair, no complaints of pain or sob. Pt states he is ready to go home.

## 2012-12-26 NOTE — Progress Notes (Signed)
   SUBJECTIVE:  Breathing OK.  He thinks that he is at baseline and he wants to go home   [place]:   12/25/12 2136 12/26/12 0523 12/26/12 0532 12/26/12 0904  BP: 93/59 77/58 98/56  98/67  Pulse: 97 98  97  Temp: 97.4 F (36.3 C) 97.7 F (36.5 C)    TempSrc: Axillary Oral    Resp: 18 20  20   Height:      Weight:  201 lb 11.5 oz (91.5 kg)    SpO2: 99% 99%  93%   General:  No distress Lungs:  Decreased breath sounds without crackles Heart:  RRR Abdomen:  Positive bowel sounds, no rebound no guarding Extremities:  No edema  LABS:  Results for orders placed during the hospital encounter of 12/20/12 (from the past 24 hour(s))  GLUCOSE, CAPILLARY     Status: Abnormal   Collection Time    12/25/12 11:07 AM      Result Value Range   Glucose-Capillary 116 (*) 70 - 99 mg/dL   Comment 1 Notify RN    GLUCOSE, CAPILLARY     Status: Abnormal   Collection Time    12/25/12  4:23 PM      Result Value Range   Glucose-Capillary 106 (*) 70 - 99 mg/dL   Comment 1 Documented in Chart     Comment 2 Notify RN    GLUCOSE, CAPILLARY     Status: Abnormal   Collection Time    12/25/12  9:35 PM      Result Value Range   Glucose-Capillary 104 (*) 70 - 99 mg/dL   Comment 1 Notify RN    GLUCOSE, CAPILLARY     Status: Abnormal   Collection Time    12/26/12  5:46 AM      Result Value Range   Glucose-Capillary 131 (*) 70 - 99 mg/dL  BASIC METABOLIC PANEL     Status: Abnormal   Collection Time    12/26/12  6:20 AM      Result Value Range   Sodium 134 (*) 135 - 145 mEq/L   Potassium 4.8  3.5 - 5.1 mEq/L   Chloride 97  96 - 112 mEq/L   CO2 28  19 - 32 mEq/L   Glucose, Bld 120 (*) 70 - 99 mg/dL   BUN 51 (*) 6 - 23 mg/dL   Creatinine, Ser 1.53 (*) 0.50 - 1.35 mg/dL   Calcium 9.4  8.4 - 10.5 mg/dL   GFR calc non Af Amer 44 (*) >90 mL/min   GFR calc Af Amer 51 (*) >90 mL/min    Intake/Output Summary (Last 24 hours) at 12/26/12 0907 Last data filed at 12/26/12 0538  Gross  per 24 hour  Intake    462 ml  Output   2550 ml  Net  -2088 ml    ASSESSMENT AND PLAN:   Acute on diastolic CHF: EF [id number] on 4/14 echo.   I think he can go home on meds as listed.  He will need transition of care.    We need to check a BMET tomorrow since the creat is rising.  Send results to Dr. [name].    CAD: Status post CABG :  Continue current therapy.    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    Jeneen Rinks [facility] 12/26/2012 9:07 AM

## 2012-12-27 ENCOUNTER — Other Ambulatory Visit (INDEPENDENT_AMBULATORY_CARE_PROVIDER_SITE_OTHER): Payer: BC Managed Care – PPO

## 2012-12-27 DIAGNOSIS — R0602 Shortness of breath: Secondary | ICD-10-CM

## 2012-12-27 LAB — BASIC METABOLIC PANEL
BUN: 62 mg/dL — ABNORMAL HIGH (ref 6–23)
CO2: 29 mEq/L (ref 19–32)
Calcium: 9.6 mg/dL (ref 8.4–10.5)
Creatinine, Ser: 2.2 mg/dL — ABNORMAL HIGH (ref 0.4–1.5)
Glucose, Bld: 87 mg/dL (ref 70–99)

## 2012-12-27 NOTE — Telephone Encounter (Signed)
Patient contacted regarding discharge from [facility] on 6/26.  Patient understands to follow up with provider Dr. [name] on 7/3 at 3:00 pm at [facility]. Patient understands discharge instructions? Yes Patient understands medications and regiment? Yes  Patient understands to bring all medications to this visit? Yes  Patient states he is feeling "average"; patient questioned whether he had to come to the office for lab work even though he was d/c'ed from hospital yesterday. I advised him that due to abnormal values that additional lab work is needed prior to f/u appointment with Dr. [name].  Patient states that coming today was just as easy as coming Monday. Patient denies c/o chest discomfort and verbalized understanding to call if he has any questions or concerns.

## 2012-12-30 ENCOUNTER — Other Ambulatory Visit: Payer: Self-pay | Admitting: *Deleted

## 2012-12-30 ENCOUNTER — Encounter: Payer: Self-pay | Admitting: *Deleted

## 2012-12-30 ENCOUNTER — Encounter: Payer: Self-pay | Admitting: Vascular Surgery

## 2012-12-30 DIAGNOSIS — I5033 Acute on chronic diastolic (congestive) heart failure: Secondary | ICD-10-CM

## 2012-12-31 ENCOUNTER — Ambulatory Visit: Payer: Medicare Other | Admitting: Vascular Surgery

## 2012-12-31 ENCOUNTER — Encounter: Payer: Self-pay | Admitting: Vascular Surgery

## 2012-12-31 ENCOUNTER — Ambulatory Visit
Admission: RE | Admit: 2012-12-31 | Discharge: 2012-12-31 | Disposition: A | Discharge status: Home / Self Care | Payer: BC Managed Care – PPO | Source: Ambulatory Visit | Attending: Vascular Surgery | Admitting: Vascular Surgery

## 2012-12-31 ENCOUNTER — Ambulatory Visit (INDEPENDENT_AMBULATORY_CARE_PROVIDER_SITE_OTHER): Payer: BC Managed Care – PPO | Admitting: Vascular Surgery

## 2012-12-31 VITALS — BP 75/48 | HR 94 | Resp 16 | Ht 66.0 in | Wt 205.0 lb

## 2012-12-31 DIAGNOSIS — I714 Abdominal aortic aneurysm, without rupture: Secondary | ICD-10-CM

## 2012-12-31 DIAGNOSIS — I739 Peripheral vascular disease, unspecified: Secondary | ICD-10-CM | POA: Insufficient documentation

## 2012-12-31 MED ORDER — IOHEXOL 350 MG/ML SOLN
80.0000 mL | Freq: Once | INTRAVENOUS | Status: AC | PRN
  Administered 2012-12-31: 80 mL via INTRAVENOUS

## 2012-12-31 NOTE — Progress Notes (Signed)
Subjective:     Patient ID: [name], male   DOB: [birth year], 72 y.o.   MRN: [id number]  HPI this 72 year old male was evaluated by me a few months ago prior to aortic valve replacement and coronary artery bypass grafting performed by Dr. Merilynn Finland. 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 He did not appear to be a stent graft candidate because of a short angulated neck. He also has a large right common iliac artery aneurysm. The patient has done relatively well since his surgery but continues to be on home oxygen and has not yet started cardiac rehabilitation because of generalized weakness. He was hospitalized a week ago and required diuresis. He is followed by Dr. Hildred Priest. He has had no abdominal or back discomfort which is new since his cardiac surgery. He did have an episode of decreased platelet count at the time of his cardiac surgery thought to be due to heparin but this was fairly mild lead II no thrombotic problems.  Past Medical History  Diagnosis Date  . Hypertension   . Hyperlipidemia   . OSA (obstructive sleep apnea)     on cpap  . Prediabetes   . CHF (congestive heart failure) 07/2012; 10/17/2012  . COPD (chronic obstructive pulmonary disease)     a.  History of heavy smoking. PFTs (4/14) with mixed obstructive (COPD) and restrictive (post-ARDS) picture.   . Pneumonia 07/2012    PNA (H1N1 influenza + pneumococcus) in [id number] complicated by respiratory failure/ARDS. Required tracheostomy, now weaned off. Had left empyema requiring chest tube.   . Diabetes mellitus without complication   . History of blood transfusion     "lots since January" (10/17/2012)  . Anxiety   . Ischemic cardiomyopathy   . Anemia of chronic disease   . Prostate cancer      s/p radiation treatment. Has indwelling foley.   . Aortic stenosis     Moderate to severe by echo in 4/14. Bioprosthetic #21 [facility] Ease aortic valve replacement  in 4/14.   [place] CAD (coronary artery disease)     Moderate to severe by echo in 4/14. Bioprosthetic #21 [facility] Ease aortic valve replacement in 4/14.   . Carotid arterial disease     Carotid dopplers (0000000) with [id number] LICA stenosis.   [place] Kitchen AAA (abdominal aortic aneurysm)     5/14 CT showed > 6 cm AAA, also right iliac aneurysm. Not stent graft candidate.   . Esophageal reflux     History  Substance Use Topics  . Smoking status: Former Smoker -- 2.00 packs/day for 58 years    Types: Cigarettes    Quit date: 07/20/2012  . Smokeless tobacco: Never Used  . Alcohol Use: No     Comment: 10/17/2012 "years since I've had a drink; never had problem with it"    Family History  Problem Relation Age of Onset  . Congestive Heart Failure    . Heart disease      Allergies  Allergen Reactions  . Cephalosporins Rash    Blisters    Current outpatient prescriptions:aspirin EC 325 MG EC tablet, Take 1 tablet (325 mg total) by mouth daily., Disp: 30 tablet, Rfl: ;  atorvastatin (LIPITOR) 80 MG tablet, Take 1 tablet (80 mg total) by mouth daily., Disp: 30 tablet, Rfl: 6;  carvedilol (COREG) 3.125 MG tablet, Take 1 tablet (3.125 mg total) by mouth 2 (two) times daily., Disp: , Rfl:  clonazePAM (KLONOPIN) 0.5 MG tablet, Take 1 tablet (0.5 mg total) by mouth 2 (two) times daily as needed (anxiety)., Disp: 60 tablet, Rfl: 3;  guaifenesin (GERI-TUSSIN) 100 MG/5ML syrup, Take 300 mg by mouth every 4 (four) hours as needed for cough., Disp: , Rfl: ;  ipratropium-albuterol (DUONEB) 0.5-2.5 (3) MG/3ML SOLN, Take 3 mLs by nebulization every 6 (six) hours., Disp: , Rfl:  lisinopril (PRINIVIL,ZESTRIL) 5 MG tablet, Take 2.5 mg by mouth daily., Disp: , Rfl: ;  nitroGLYCERIN (NITROSTAT) 0.4 MG SL tablet, Place 1 tablet (0.4 mg total) under the tongue every 5 (five) minutes as needed for chest pain., Disp: 25 tablet, Rfl: 12;  pantoprazole (PROTONIX) 40 MG tablet, Take 1 tablet by mouth daily., Disp: , Rfl: ;   potassium chloride (KLOR-CON 10) 10 MEQ tablet, Take 20 mEq by mouth 2 (two) times daily. , Disp: , Rfl:  sitaGLIPtin (JANUVIA) 100 MG tablet, Take 100 mg by mouth daily., Disp: , Rfl: ;  torsemide (DEMADEX) 20 MG tablet, 2 tablets (total 40mg ) two times a day, Disp: , Rfl:   BP 75/48  Pulse 94  Resp 16  Ht 5\' 6"  (1.676 m)  Wt 205 lb (92.987 kg)  BMI 33.1 kg/m2  Body mass index is 33.1 kg/(m^2).           Review of Systems patient complains of a diffuse edema, on home oxygen, weakness in arms and legs, distal edema, occasional wheezing. Other systems negative and complete review of system    Objective:   Physical Exam blood pressure was 75/40 heart rate 94 respirations 16 Gen.-alert and oriented x3 in no apparent distress chronically ill in appearance with nasal oxygen  HEENT normal for age Lungs bilateral rhonchi and wheezing  Cardiovascular regular rhythm no murmurs carotid pulses 3+ palpable no bruits audible Abdomen soft nontender no palpable masses-obese-6-7 cm pulsatile mass  Musculoskeletal free of  major deformities Skin clear -no rashes Neurologic normal Lower extremities 3+ femoral pulses palpable bilaterally with well perfused lower extremities-1+ edema  Today he ordered a CT angiogram of the abdomen and pelvis which I reviewed and interpreted. The patient has a 6.9 x 6.8 cm infrarenal abdominal aortic aneurysm with a large right common iliac artery aneurysm. He has a very short angulated neck which has thrombus up to the renal arteries.       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 Patient is not ready for surgery. Still deconditioned from cardiac surgery and previous medical issues We'll see patient back in 2 months to see how he is progressing Patient and family understand that rupture of aneurysm and antrum is a possibility but I think the risk would be to proceed with surgery at this time Patient will be followed by Dr. Marlyce Huge in the interim and he will give input regarding need for any further cardiac workup preoperatively Return in 2 months

## 2013-01-01 NOTE — Addendum Note (Signed)
Addended by: Dorthula Rue L on: 01/01/2013 11:30 AM   Modules accepted: Orders

## 2013-01-01 NOTE — Addendum Note (Signed)
Addended by: Dorthula Rue L on: 01/01/2013 11:16 AM   Modules accepted: Orders

## 2013-01-02 ENCOUNTER — Ambulatory Visit (INDEPENDENT_AMBULATORY_CARE_PROVIDER_SITE_OTHER): Payer: BC Managed Care – PPO | Admitting: Cardiology

## 2013-01-02 ENCOUNTER — Other Ambulatory Visit: Payer: BC Managed Care – PPO

## 2013-01-02 ENCOUNTER — Encounter: Payer: Self-pay | Admitting: Cardiology

## 2013-01-02 VITALS — BP 110/58 | HR 85 | Ht 66.0 in | Wt 211.0 lb

## 2013-01-02 DIAGNOSIS — J449 Chronic obstructive pulmonary disease, unspecified: Secondary | ICD-10-CM

## 2013-01-02 DIAGNOSIS — Z953 Presence of xenogenic heart valve: Secondary | ICD-10-CM

## 2013-01-02 DIAGNOSIS — I509 Heart failure, unspecified: Secondary | ICD-10-CM

## 2013-01-02 DIAGNOSIS — I714 Abdominal aortic aneurysm, without rupture: Secondary | ICD-10-CM

## 2013-01-02 DIAGNOSIS — I5033 Acute on chronic diastolic (congestive) heart failure: Secondary | ICD-10-CM

## 2013-01-02 DIAGNOSIS — J8410 Pulmonary fibrosis, unspecified: Secondary | ICD-10-CM

## 2013-01-02 DIAGNOSIS — Z952 Presence of prosthetic heart valve: Secondary | ICD-10-CM

## 2013-01-02 DIAGNOSIS — R0602 Shortness of breath: Secondary | ICD-10-CM

## 2013-01-02 MED ORDER — HYDRALAZINE HCL 10 MG PO TABS: 10.0000 mg | ORAL_TABLET | Freq: Three times a day (TID) | ORAL | Status: DC

## 2013-01-02 MED ORDER — ISOSORBIDE MONONITRATE ER 30 MG PO TB24: 30.0000 mg | ORAL_TABLET | Freq: Every day | ORAL | Status: DC

## 2013-01-02 NOTE — Patient Instructions (Addendum)
Stop lisinopril.  Take torsemide 60mg  two times a day.  This will be 3 of your 20mg  tablets two times a day.   Start Imdur (isosorbide) 30mg  daily.   Start Hydralazine 10mg  three times a day.   Your physician recommends that you have  lab work today--BMET/BNP.   Your physician recommends that you schedule a follow-up appointment in: 1 week in the [facility] at [facility].    Your physician recommends that you return for lab work in: 1 week when you are seen in the [facility] Vascular Clinic. BMET  You have been referred to Nephrology.   Your physician recommends that you schedule a follow-up appointment in: 3 weeks with Dr [name].This can be scheduled at the [facility] Vascular Clinic when Dr [name] is there.

## 2013-01-03 ENCOUNTER — Inpatient Hospital Stay (HOSPITAL_COMMUNITY)
Admission: EM | Admit: 2013-01-03 | Discharge: 2013-01-05 | DRG: 543 | Disposition: A | Discharge status: Home / Self Care | Payer: BC Managed Care – PPO | Attending: Internal Medicine | Admitting: Internal Medicine

## 2013-01-03 ENCOUNTER — Inpatient Hospital Stay (HOSPITAL_COMMUNITY): Payer: BC Managed Care – PPO

## 2013-01-03 ENCOUNTER — Telehealth: Payer: Self-pay | Admitting: Physician Assistant

## 2013-01-03 ENCOUNTER — Encounter (HOSPITAL_COMMUNITY): Payer: Self-pay | Admitting: Emergency Medicine

## 2013-01-03 DIAGNOSIS — Z7982 Long term (current) use of aspirin: Secondary | ICD-10-CM

## 2013-01-03 DIAGNOSIS — N183 Chronic kidney disease, stage 3 unspecified: Secondary | ICD-10-CM | POA: Diagnosis present

## 2013-01-03 DIAGNOSIS — I739 Peripheral vascular disease, unspecified: Secondary | ICD-10-CM

## 2013-01-03 DIAGNOSIS — N170 Acute kidney failure with tubular necrosis: Secondary | ICD-10-CM | POA: Diagnosis present

## 2013-01-03 DIAGNOSIS — E1165 Type 2 diabetes mellitus with hyperglycemia: Secondary | ICD-10-CM | POA: Diagnosis present

## 2013-01-03 DIAGNOSIS — J9589 Other postprocedural complications and disorders of respiratory system, not elsewhere classified: Secondary | ICD-10-CM | POA: Diagnosis present

## 2013-01-03 DIAGNOSIS — N289 Disorder of kidney and ureter, unspecified: Secondary | ICD-10-CM

## 2013-01-03 DIAGNOSIS — Z01811 Encounter for preprocedural respiratory examination: Secondary | ICD-10-CM

## 2013-01-03 DIAGNOSIS — N179 Acute kidney failure, unspecified: Secondary | ICD-10-CM

## 2013-01-03 DIAGNOSIS — I959 Hypotension, unspecified: Secondary | ICD-10-CM

## 2013-01-03 DIAGNOSIS — J80 Acute respiratory distress syndrome: Secondary | ICD-10-CM

## 2013-01-03 DIAGNOSIS — I509 Heart failure, unspecified: Secondary | ICD-10-CM | POA: Diagnosis present

## 2013-01-03 DIAGNOSIS — I714 Abdominal aortic aneurysm, without rupture, unspecified: Principal | ICD-10-CM

## 2013-01-03 DIAGNOSIS — I5033 Acute on chronic diastolic (congestive) heart failure: Secondary | ICD-10-CM

## 2013-01-03 DIAGNOSIS — I723 Aneurysm of iliac artery: Secondary | ICD-10-CM

## 2013-01-03 DIAGNOSIS — J8410 Pulmonary fibrosis, unspecified: Secondary | ICD-10-CM

## 2013-01-03 DIAGNOSIS — E119 Type 2 diabetes mellitus without complications: Secondary | ICD-10-CM

## 2013-01-03 DIAGNOSIS — J449 Chronic obstructive pulmonary disease, unspecified: Secondary | ICD-10-CM

## 2013-01-03 DIAGNOSIS — E872 Acidosis, unspecified: Secondary | ICD-10-CM | POA: Diagnosis present

## 2013-01-03 DIAGNOSIS — Z953 Presence of xenogenic heart valve: Secondary | ICD-10-CM

## 2013-01-03 DIAGNOSIS — Z952 Presence of prosthetic heart valve: Secondary | ICD-10-CM

## 2013-01-03 DIAGNOSIS — J95821 Acute postprocedural respiratory failure: Secondary | ICD-10-CM

## 2013-01-03 DIAGNOSIS — I6529 Occlusion and stenosis of unspecified carotid artery: Secondary | ICD-10-CM | POA: Diagnosis present

## 2013-01-03 DIAGNOSIS — Z951 Presence of aortocoronary bypass graft: Secondary | ICD-10-CM

## 2013-01-03 DIAGNOSIS — E875 Hyperkalemia: Secondary | ICD-10-CM

## 2013-01-03 DIAGNOSIS — I350 Nonrheumatic aortic (valve) stenosis: Secondary | ICD-10-CM

## 2013-01-03 DIAGNOSIS — E871 Hypo-osmolality and hyponatremia: Secondary | ICD-10-CM | POA: Diagnosis present

## 2013-01-03 DIAGNOSIS — Z87891 Personal history of nicotine dependence: Secondary | ICD-10-CM

## 2013-01-03 DIAGNOSIS — J4489 Other specified chronic obstructive pulmonary disease: Secondary | ICD-10-CM | POA: Diagnosis present

## 2013-01-03 DIAGNOSIS — I5042 Chronic combined systolic (congestive) and diastolic (congestive) heart failure: Secondary | ICD-10-CM | POA: Diagnosis present

## 2013-01-03 DIAGNOSIS — I359 Nonrheumatic aortic valve disorder, unspecified: Secondary | ICD-10-CM

## 2013-01-03 DIAGNOSIS — G4733 Obstructive sleep apnea (adult) (pediatric): Secondary | ICD-10-CM | POA: Diagnosis present

## 2013-01-03 DIAGNOSIS — N189 Chronic kidney disease, unspecified: Secondary | ICD-10-CM

## 2013-01-03 DIAGNOSIS — I951 Orthostatic hypotension: Secondary | ICD-10-CM

## 2013-01-03 DIAGNOSIS — I9589 Other hypotension: Secondary | ICD-10-CM | POA: Diagnosis present

## 2013-01-03 DIAGNOSIS — R0902 Hypoxemia: Secondary | ICD-10-CM

## 2013-01-03 DIAGNOSIS — D638 Anemia in other chronic diseases classified elsewhere: Secondary | ICD-10-CM | POA: Diagnosis present

## 2013-01-03 DIAGNOSIS — I2510 Atherosclerotic heart disease of native coronary artery without angina pectoris: Secondary | ICD-10-CM

## 2013-01-03 DIAGNOSIS — E785 Hyperlipidemia, unspecified: Secondary | ICD-10-CM | POA: Diagnosis present

## 2013-01-03 DIAGNOSIS — I129 Hypertensive chronic kidney disease with stage 1 through stage 4 chronic kidney disease, or unspecified chronic kidney disease: Secondary | ICD-10-CM | POA: Diagnosis present

## 2013-01-03 DIAGNOSIS — Z6833 Body mass index (BMI) 33.0-33.9, adult: Secondary | ICD-10-CM

## 2013-01-03 DIAGNOSIS — F419 Anxiety disorder, unspecified: Secondary | ICD-10-CM

## 2013-01-03 DIAGNOSIS — J9620 Acute and chronic respiratory failure, unspecified whether with hypoxia or hypercapnia: Secondary | ICD-10-CM

## 2013-01-03 LAB — URINE MICROSCOPIC-ADD ON

## 2013-01-03 LAB — CBC WITH DIFFERENTIAL/PLATELET
Basophils Absolute: 0 10*3/uL (ref 0.0–0.1)
Basophils Relative: 0 % (ref 0–1)
Eosinophils Absolute: 1.5 K/uL — ABNORMAL HIGH (ref 0.0–0.7)
Eosinophils Relative: 20 % — ABNORMAL HIGH (ref 0–5)
HCT: 29.1 % — ABNORMAL LOW (ref 39.0–52.0)
Hemoglobin: 9.1 g/dL — ABNORMAL LOW (ref 13.0–17.0)
Lymphocytes Relative: 10 % — ABNORMAL LOW (ref 12–46)
Lymphs Abs: 0.8 K/uL (ref 0.7–4.0)
MCH: 27.5 pg (ref 26.0–34.0)
MCHC: 31.3 g/dL (ref 30.0–36.0)
MCV: 87.9 fL (ref 78.0–100.0)
Monocytes Absolute: 0.6 10*3/uL (ref 0.1–1.0)
Monocytes Relative: 8 % (ref 3–12)
Neutro Abs: 4.8 10*3/uL (ref 1.7–7.7)
Neutrophils Relative %: 62 % (ref 43–77)
Platelets: 242 K/uL (ref 150–400)
RBC: 3.31 MIL/uL — ABNORMAL LOW (ref 4.22–5.81)
RDW: 20.5 % — ABNORMAL HIGH (ref 11.5–15.5)
WBC: 7.8 K/uL (ref 4.0–10.5)

## 2013-01-03 LAB — RENAL FUNCTION PANEL
CO2: 24 mEq/L (ref 19–32)
GFR calc Af Amer: 15 mL/min — ABNORMAL LOW (ref >90)
Glucose, Bld: 98 mg/dL (ref 70–99)
Phosphorus: 6.6 mg/dL — ABNORMAL HIGH (ref 2.3–4.6)
Potassium: 5.6 mEq/L — ABNORMAL HIGH (ref 3.5–5.1)
Sodium: 134 mEq/L — ABNORMAL LOW (ref 135–145)

## 2013-01-03 LAB — GLUCOSE, CAPILLARY: Glucose-Capillary: 84 mg/dL (ref 70–99)

## 2013-01-03 LAB — CBC
HCT: 29.5 % — ABNORMAL LOW (ref 39.0–52.0)
MCHC: 31.5 g/dL (ref 30.0–36.0)
MCV: 88.9 fL (ref 78.0–100.0)
RDW: 20.4 % — ABNORMAL HIGH (ref 11.5–15.5)

## 2013-01-03 LAB — BASIC METABOLIC PANEL
BUN: 89 mg/dL — ABNORMAL HIGH (ref 6–23)
Calcium: 9 mg/dL (ref 8.4–10.5)
Chloride: 96 mEq/L (ref 96–112)
Creatinine, Ser: 4.73 mg/dL — ABNORMAL HIGH (ref 0.50–1.35)
GFR calc Af Amer: 13 mL/min — ABNORMAL LOW (ref >90)
GFR calc non Af Amer: 11 mL/min — ABNORMAL LOW (ref >90)
Glucose, Bld: 105 mg/dL — ABNORMAL HIGH (ref 70–99)

## 2013-01-03 LAB — DIFFERENTIAL
Basophils Absolute: 0 10*3/uL (ref 0.0–0.1)
Eosinophils Absolute: 1.4 10*3/uL — ABNORMAL HIGH (ref 0.0–0.7)
Lymphs Abs: 0.8 10*3/uL (ref 0.7–4.0)
Neutrophils Relative %: 58 % (ref 43–77)

## 2013-01-03 LAB — URIC ACID: Uric Acid, Serum: 12.8 mg/dL — ABNORMAL HIGH (ref 4.0–7.8)

## 2013-01-03 LAB — BRAIN NATRIURETIC PEPTIDE: Brain Natriuretic Peptide: 89.7 pg/mL (ref 0.0–100.0)

## 2013-01-03 LAB — URINALYSIS, ROUTINE W REFLEX MICROSCOPIC
Bilirubin Urine: NEGATIVE
Specific Gravity, Urine: 1.015 (ref 1.005–1.030)
pH: 5.5 (ref 5.0–8.0)

## 2013-01-03 LAB — BASIC METABOLIC PANEL WITH GFR
BUN: 93 mg/dL — ABNORMAL HIGH (ref 6–23)
CO2: 21 meq/L (ref 19–32)
Calcium: 9.2 mg/dL (ref 8.4–10.5)
Glucose, Bld: 94 mg/dL (ref 70–99)
Potassium: 6.5 meq/L (ref 3.5–5.1)
Sodium: 130 meq/L — ABNORMAL LOW (ref 135–145)

## 2013-01-03 LAB — PROTEIN / CREATININE RATIO, URINE: Total Protein, Urine: 12.6 mg/dL

## 2013-01-03 MED ORDER — ATORVASTATIN CALCIUM 80 MG PO TABS
80.0000 mg | ORAL_TABLET | Freq: Every day | ORAL | Status: DC
  Administered 2013-01-03 – 2013-01-05 (×3): 80 mg via ORAL
  Filled 2013-01-03 (×3): qty 1

## 2013-01-03 MED ORDER — ACETAMINOPHEN 650 MG RE SUPP: 650.0000 mg | Freq: Four times a day (QID) | RECTAL | Status: DC | PRN

## 2013-01-03 MED ORDER — ENOXAPARIN SODIUM 30 MG/0.3ML ~~LOC~~ SOLN
30.0000 mg | SUBCUTANEOUS | Status: DC
  Administered 2013-01-03 – 2013-01-04 (×2): 30 mg via SUBCUTANEOUS
  Filled 2013-01-03 (×3): qty 0.3

## 2013-01-03 MED ORDER — ISOSORBIDE MONONITRATE ER 30 MG PO TB24
30.0000 mg | ORAL_TABLET | Freq: Every day | ORAL | Status: DC
  Administered 2013-01-03: 30 mg via ORAL
  Filled 2013-01-03 (×2): qty 1

## 2013-01-03 MED ORDER — ALBUTEROL SULFATE (5 MG/ML) 0.5% IN NEBU
2.5000 mg | INHALATION_SOLUTION | Freq: Four times a day (QID) | RESPIRATORY_TRACT | Status: DC
  Administered 2013-01-03 – 2013-01-04 (×4): 2.5 mg via RESPIRATORY_TRACT
  Filled 2013-01-03 (×3): qty 0.5

## 2013-01-03 MED ORDER — ONDANSETRON HCL 4 MG PO TABS: 4.0000 mg | ORAL_TABLET | Freq: Four times a day (QID) | ORAL | Status: DC | PRN

## 2013-01-03 MED ORDER — HYDROXYZINE HCL 10 MG PO TABS
10.0000 mg | ORAL_TABLET | Freq: Four times a day (QID) | ORAL | Status: DC | PRN
  Filled 2013-01-03: qty 1

## 2013-01-03 MED ORDER — SODIUM CHLORIDE 0.9 % IV SOLN
INTRAVENOUS | Status: DC
  Administered 2013-01-03 – 2013-01-04 (×2): 25 mL/h via INTRAVENOUS
  Administered 2013-01-04: 10:00:00 via INTRAVENOUS

## 2013-01-03 MED ORDER — SODIUM CHLORIDE 0.9 % IV SOLN
Freq: Once | INTRAVENOUS | Status: AC
  Administered 2013-01-03: 10:00:00 via INTRAVENOUS

## 2013-01-03 MED ORDER — IPRATROPIUM BROMIDE 0.02 % IN SOLN
0.5000 mg | Freq: Four times a day (QID) | RESPIRATORY_TRACT | Status: DC
  Administered 2013-01-03 – 2013-01-04 (×4): 0.5 mg via RESPIRATORY_TRACT
  Filled 2013-01-03 (×3): qty 2.5

## 2013-01-03 MED ORDER — ONDANSETRON HCL 4 MG/2ML IJ SOLN: 4.0000 mg | Freq: Four times a day (QID) | INTRAMUSCULAR | Status: DC | PRN

## 2013-01-03 MED ORDER — HYDROCORTISONE 1 % EX CREA
TOPICAL_CREAM | Freq: Two times a day (BID) | CUTANEOUS | Status: DC
  Administered 2013-01-03 – 2013-01-05 (×4): via TOPICAL
  Filled 2013-01-03 (×2): qty 28

## 2013-01-03 MED ORDER — ASPIRIN EC 325 MG PO TBEC
325.0000 mg | DELAYED_RELEASE_TABLET | Freq: Every day | ORAL | Status: DC
  Administered 2013-01-03 – 2013-01-05 (×3): 325 mg via ORAL
  Filled 2013-01-03 (×3): qty 1

## 2013-01-03 MED ORDER — NITROGLYCERIN 0.4 MG SL SUBL: 0.4000 mg | SUBLINGUAL_TABLET | SUBLINGUAL | Status: DC | PRN

## 2013-01-03 MED ORDER — SODIUM POLYSTYRENE SULFONATE 15 GM/60ML PO SUSP
50.0000 g | Freq: Once | ORAL | Status: DC
  Filled 2013-01-03: qty 240

## 2013-01-03 MED ORDER — SODIUM CHLORIDE 0.45 % IV SOLN
INTRAVENOUS | Status: DC
  Administered 2013-01-03: 75 mL/h via INTRAVENOUS

## 2013-01-03 MED ORDER — INSULIN ASPART 100 UNIT/ML ~~LOC~~ SOLN: 0.0000 [IU] | Freq: Three times a day (TID) | SUBCUTANEOUS | Status: DC

## 2013-01-03 MED ORDER — SODIUM POLYSTYRENE SULFONATE 15 GM/60ML PO SUSP
30.0000 g | Freq: Once | ORAL | Status: AC
  Administered 2013-01-03: 30 g via ORAL
  Filled 2013-01-03: qty 120

## 2013-01-03 MED ORDER — SODIUM CHLORIDE 0.9 % IJ SOLN
3.0000 mL | Freq: Two times a day (BID) | INTRAMUSCULAR | Status: DC
  Administered 2013-01-04 – 2013-01-05 (×2): 3 mL via INTRAVENOUS

## 2013-01-03 MED ORDER — SODIUM CHLORIDE 0.9 % IV BOLUS (SEPSIS)
500.0000 mL | Freq: Once | INTRAVENOUS | Status: AC
  Administered 2013-01-03: 500 mL via INTRAVENOUS

## 2013-01-03 MED ORDER — PANTOPRAZOLE SODIUM 40 MG PO TBEC
40.0000 mg | DELAYED_RELEASE_TABLET | Freq: Every day | ORAL | Status: DC
  Administered 2013-01-03 – 2013-01-05 (×3): 40 mg via ORAL
  Filled 2013-01-03 (×2): qty 1

## 2013-01-03 MED ORDER — ACETAMINOPHEN 325 MG PO TABS: 650.0000 mg | ORAL_TABLET | Freq: Four times a day (QID) | ORAL | Status: DC | PRN

## 2013-01-03 NOTE — H&P (Addendum)
[facility] History and Physical  [name] [id number] DOB: Mar 27, 1941 DOA: 01/03/2013  Referring phyician:  M. Ghim, MD PCP:  [name], MD   Chief Complaint:  Abnormal labs  HPI:  The patient is a 72 y.o. year-old male with history of AAA now 6.9x7cm with atherosclerosis and mural thrombus, CAD status post CABG and aVR, chronic diastolic and systolic heart failure with ejection fraction currently 50-55% and grade 2 diastolic dysfunction, ARDS/respiratory failure due to age 89 and 1 influenza and pneumococcal pneumonia resulting in tracheostomy in January of 2014 which has subsequently been removed, hypertension, hyperlipidemia, type 2 diabetes mellitus, COPD, pulmonary fibrosis.   He was hospitalized in January for respiratory failure as above. He was hospitalized for acute systolic heart failure exacerbation with ejection fraction of 35% in April.  He was diureses and underwent cardiac catheterization which demonstrated three-vessel CAD. He also had moderate to severe aortic valve stenosis. He underwent CABG and aortic valve replacements later that month.  He was rehospitalized in late June for shortness of breath and acute on chronic congestive heart failure exacerbation.  He was diuresed and his creatinine increased from 1.1-1.5 at which time the diuresis with discontinued and he was discharged home.  Repeat creatinine on 6/27 was 2.2 so his diuretics were held for one day and then restarted.  Due to intermittent headaches that he has been taking ibuprofen 600 mg approximately once every other day.  He also has chronically low blood pressures ranging from the [id number] to 0000000 systolic. On July 1st, he underwent CT angiogram of the abdomen and pelvis as a preoperative exam for stent placement.  On July 3 routine labs demonstrated Na 129, potassium 6.6, CO2 of 22, BUN 89, creatinine 4.17.  The patient was advised to come immediately to the emergency department. His labs currently are sodium  130, potassium 6.5, CO2 of 21, BUN 93, creatinine 4.73.  His CBC is approximately stable from prior with chronic normocytic anemia.    He is made less urine for the last 2 days. He took his torsemide to 3 times last night and only voided once. He states normally when he takes 2 tabs, he voids many times. His appetite has been decreased for the last couple of days, however he has not had any nausea, confusion, fuzzy thinking, increased lower 70 swelling. He states that he has gained approximately 10 pounds since his last weight check, it and he thinks that his weight is probably in his abdomen. He denies increased shortness of breath, orthopnea, PND compared to baseline.  In the emergency department, he was given a small fluid bolus to 2 mild hypotension and Kayexalate. EKG did not demonstrate any peaked T waves. Nephrology has already been consulted and cardiology is aware of the patient's admission.  Review of Systems:  Denies fevers, chills, changes to hearing and vision.   10-lb weight gain.  Denies rhinorrhea, sinus congestion, sore throat.  Denies chest pain and palpitations.  Denies SOB, wheezing, cough.  Denies nausea, vomiting, constipation, diarrhea.  Denies dysuria, frequency, urgency, polyuria, polydipsia.  Denies hematemesis, blood in stools, melena, abnormal bruising or bleeding.  Denies lymphadenopathy.  Denies arthralgias, myalgias. Diffuse erythematous but nonpruritic rash after contrast.  Stable lower extremity edema.  Denies focal numbness, weakness, slurred speech, confusion, facial droop.  Denies anxiety and depression.    Past Medical History  Diagnosis Date  . Hypertension   . Hyperlipidemia   . OSA (obstructive sleep apnea)     on cpap  .  Prediabetes   . CHF (congestive heart failure) 07/2012; 10/17/2012  . COPD (chronic obstructive pulmonary disease)     a.  History of heavy smoking. PFTs (4/14) with mixed obstructive (COPD) and restrictive (post-ARDS) picture.   . Pneumonia  07/2012    PNA (H1N1 influenza + pneumococcus) in [id number] complicated by respiratory failure/ARDS. Required tracheostomy, now weaned off. Had left empyema requiring chest tube.   . Diabetes mellitus without complication   . History of blood transfusion     "lots since January" (10/17/2012)  . Anxiety   . Ischemic cardiomyopathy   . Anemia of chronic disease   . Prostate cancer      s/p radiation treatment. Has indwelling foley.   . Aortic stenosis     Moderate to severe by echo in 4/14. Bioprosthetic #21 [facility] Ease aortic valve replacement in 4/14.   Marland Kitchen CAD (coronary artery disease)     Moderate to severe by echo in 4/14. Bioprosthetic #21 [facility] Ease aortic valve replacement in 4/14.   . Carotid arterial disease     Carotid dopplers (0000000) with A999333 LICA stenosis.   Marland Kitchen AAA (abdominal aortic aneurysm)     5/14 CT showed > 6 cm AAA, also right iliac aneurysm. Not stent graft candidate.   . Esophageal reflux    Past Surgical History  Procedure Laterality Date  . Transurethral microwave therapy  10/15/2012  . Nasal fracture surgery  1970's  . Tracheostomy  07/2012  . Tracheostomy closure  08/2012  . Anal fissure repair  2008  . Coronary artery bypass graft N/A 10/25/2012    Procedure: CORONARY ARTERY BYPASS GRAFTING (CABG);  Surgeon: Melrose Nakayama, MD;  Location: [place];  Service: Open Heart Surgery;  Laterality: N/A;  times 4 using left internal mammary artery and endoscopically harvested bilateral saphenous vein   . Aortic valve replacement N/A 10/25/2012    Procedure: AORTIC VALVE REPLACEMENT (AVR);  Surgeon: Melrose Nakayama, MD;  Location: [place];  Service: Open Heart Surgery;  Laterality: N/A;  . Intraoperative transesophageal echocardiogram N/A 10/25/2012    Procedure: INTRAOPERATIVE TRANSESOPHAGEAL ECHOCARDIOGRAM;  Surgeon: Melrose Nakayama, MD;  Location: [place];  Service: Open Heart Surgery;  Laterality: N/A;   Social History:  reports that he quit smoking  about 5 months ago. His smoking use included Cigarettes. He has a 116 pack-year smoking history. He has never used smokeless tobacco. He reports that he does not drink alcohol or use illicit drugs. Patient lives with his wife.    Allergies  Allergen Reactions  . Cephalosporins Rash    Blisters    Family History  Problem Relation Age of Onset  . Congestive Heart Failure    . Heart disease       Prior to Admission medications   Medication Sig Start Date End Date Taking? Authorizing Provider  aspirin EC 325 MG EC tablet Take 1 tablet (325 mg total) by mouth daily. 11/04/12  Yes Donielle Liston Alba, PA-C  atorvastatin (LIPITOR) 80 MG tablet Take 1 tablet (80 mg total) by mouth daily. 11/18/12  Yes Larey Dresser, MD  carvedilol (COREG) 3.125 MG tablet Take 1 tablet (3.125 mg total) by mouth 2 (two) times daily. 11/22/12  Yes Larey Dresser, MD  clonazePAM (KLONOPIN) 0.5 MG tablet Take 1 tablet (0.5 mg total) by mouth 2 (two) times daily as needed (anxiety). 12/26/12  Yes Roger A Arguello, PA-C  hydrALAZINE (APRESOLINE) 10 MG tablet Take 1 tablet (10 mg total) by mouth  3 (three) times daily. 01/02/13  Yes Larey Dresser, MD  hydrocortisone cream 1 % Apply 1 application topically daily.   Yes Historical Provider, MD  ibuprofen (ADVIL,MOTRIN) 200 MG tablet Take 600 mg by mouth as needed for pain or headache.   Yes Historical Provider, MD  ipratropium-albuterol (DUONEB) 0.5-2.5 (3) MG/3ML SOLN Take 3 mLs by nebulization every 6 (six) hours.   Yes Historical Provider, MD  isosorbide mononitrate (IMDUR) 30 MG 24 hr tablet Take 1 tablet (30 mg total) by mouth daily. 01/02/13  Yes Larey Dresser, MD  nitroGLYCERIN (NITROSTAT) 0.4 MG SL tablet Place 1 tablet (0.4 mg total) under the tongue every 5 (five) minutes as needed for chest pain. 12/26/12  Yes Roger A Arguello, PA-C  pantoprazole (PROTONIX) 40 MG tablet Take 1 tablet by mouth daily. 12/17/12  Yes Historical Provider, MD  potassium chloride (KLOR-CON  10) 10 MEQ tablet Take 10 mEq by mouth 2 (two) times daily.  12/30/12  Yes Larey Dresser, MD  sitaGLIPtin (JANUVIA) 100 MG tablet Take 100 mg by mouth daily.   Yes Historical Provider, MD  torsemide (DEMADEX) 20 MG tablet 3 tablets (total 60mg ) two times a day 01/02/13  Yes Larey Dresser, MD   Physical Exam: Filed Vitals:   01/03/13 0915 01/03/13 0945 01/03/13 1000 01/03/13 1015  BP: 87/54 86/56 92/59  100/67  Pulse: 84  85 82  Temp:      TempSrc:      Resp: 18 19 19 15   SpO2: 99%  100% 99%     General:  Caucasian male, no acute distress  Eyes:  PERRL, anicteric, non-injected.  ENT:  Nares clear.  OP clear, non-erythematous without plaques or exudates.  MMM.  Neck:  Supple without TM or JVD.    Lymph:  No cervical, supraclavicular, or submandibular LAD.  Cardiovascular:  RRR, normal S1, S2, without m/r/g.  2+ pulses, warm extremities  Respiratory:  CTA bilaterally without increased WOB.  Abdomen:  NABS.  Soft, mildly distended, nontender    Skin:  Dry skin.  Erythematous maculopapular rash that extends from just below the knee to the feet laterally. No ulcers or vesicles.  Musculoskeletal:  Normal bulk and tone.  1+ LE edema.  Psychiatric:  A & O x 4.  Appropriate affect.  Neurologic:  CN 3-12 intact.  5/5 strength.  Sensation intact.  Labs on Admission:  Basic Metabolic Panel:  Recent Labs Lab 12/27/12 1336 01/02/13 1618 01/03/13 0915  NA 132* 129* 130*  K 5.0 6.6* 6.5*  CL 95* 92* 96  CO2 29 22 21   GLUCOSE 87 105* 94  BUN 62* 89* 93*  CREATININE 2.2* 4.17* 4.73*  CALCIUM 9.6 9.0 9.2   Liver Function Tests: No results found for this basename: AST, ALT, ALKPHOS, BILITOT, PROT, ALBUMIN,  in the last 168 hours No results found for this basename: LIPASE, AMYLASE,  in the last 168 hours No results found for this basename: AMMONIA,  in the last 168 hours CBC:  Recent Labs Lab 01/03/13 0915  WBC 7.8  NEUTROABS 4.8  HGB 9.1*  HCT 29.1*  MCV 87.9  PLT  242   Cardiac Enzymes: No results found for this basename: CKTOTAL, CKMB, CKMBINDEX, TROPONINI,  in the last 168 hours  BNP (last 3 results)  Recent Labs  12/20/12 1819 12/24/12 0410 12/27/12 1336  PROBNP 2056.0* 1380.0* 79.0   CBG: No results found for this basename: GLUCAP,  in the last 168 hours  Radiological Exams on  Admission: No results found.  EKG: Independently reviewed. Normal sinus rhythm with right bundle branch block which is chronic. Stable inverted T waves in V1 through V3. No evidence of ST segment elevation or depression. No peaked T waves.  Assessment/Plan Active Problems:   * No active hospital problems. *   Active Problems:   * No active hospital problems. *  Acute on chronic kidney disease. Baseline creatinine of 1.1-1.2 with stage III CKD.  Given the patient's comorbidities he is likely not a good candidate for hemodialysis. His current acute kidney injury is likely a combination of ATN secondary to prolonged hypovolemic state, chronic hypotension, contrast induced nephropathy, and NSAID use.  There is no evidence of renal artery stenosis or acute thrombus on his CTA however he does have diffuse atherosclerosis and aneurysm and mural thrombus which may threaten his renal function.   -  Minimize nephrotoxins -  Renally dose medications -  Appreciate nephrology recommendations -  Agree with gentle hydration and holding diuretics for now. -  Judicious use of fluids in the setting of chronic diastolic and systolic heart failure -  Kayexalate as needed -  If acidosis worsens consider sodium bicarbonate  -  Renal ultrasound -  Fractional excretion of urea -  Urinalysis is pending -  Blood pressure medications to improve renal perfusion  Abnormal urinalysis: Increased white blood cells and positive leukocyte esterase or more likely reflective of the patient's acute kidney injury, see above. He has not had any symptoms of dysuria or urinary frequency or  urgency. -  Defer antibiotics for now -  Followup urine culture  CAD: Status post CABG for 3VD. Chest pain free.  Continue aspirin and statin. Hold beta blocker because of hypotension.  Chronic systolic CHF: EF around [id number] prior to surgery, appeared to improve post-op with last echo showing EF around 50%. NYHA class IIIb symptoms at this point. He has gained 10 lbs. -  Judicious use of IV fluids -  Monitor for increased shortness of breath, hypoxia -  Avoiding beta blocker, hydralazine, ARB/ACE inhibitor do to renal failure and hypotension  Pulmonary: Mixed restrictive (post-ARDS pulmonary fibrosis)/obstructive (COPD) picture.  -  Duo nebs -  Continue home oxygen  OSA, chronic, stable. Continue CPAP at night.  T2DM,  -  Hold januvia -  Low dose SSI  Carotid stenosis, chronic, stable: Repeat carotid dopplers in 10/14.  Hyperlipidemia, Chronic, stable.  Continue statin  AAA with extension to the right common iliac artery and associated mural thrombus without occlusion:  6.9 x 7 cm.  See report from 7/1. -   per cardiology he is not a candidate for stent grafting.  -  Following with Dr. 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 -  Repeat BMP in a.m.   Normocytic anemia, chronic, stable. Likely due to renal portable disease and chronic disease.  -  Check iron studies, TSH, B12, folate  Diet:  Renal dialysis  Access:  PIV  IVF:  Normal saline at 75 mL per hour x12 hours  Proph:  Lovenox  Code Status: Full Code  Family Communication: Spoke with the patient and his wife  Disposition Plan: Admit to telemetry.  Anticipate a long hospitalization as we monitor improvement in his renal function and watch for progression of heart failure.  Time spent: 60 min  Janece Canterbury [facility] Pager [phone]  If 7PM-7AM, please contact night-coverage [email or web address] Password [facility] 01/03/2013, 10:39 AM

## 2013-01-03 NOTE — Telephone Encounter (Signed)
The cardiology overnight fellow signed out to me that he had received critical labs on a patient that Dr. [name] had seen in clinic yesterday. K is 6.6 without visible hemolysis and Cr has gone from 2.2 to 4.17. The fellow had tried to reach the patient without success. I called the patient as well as the 3 emergency contacts listed but got voicemails on all 4 initially. I called him a 2nd time and he picked up. I relayed the information that his labs were dangerously high and he should proceed to the NEAREST ER immediately and not to drive himself. I told him to let the ER know his K was 6.6 and kidney function was elevated. I strongly advised the importance of getting to an ER ASAP. I advised him not to eat or drink anything right now as well. He verbalized understand and gratitude and will proceed. Dayna Dunn PA-C

## 2013-01-03 NOTE — ED Notes (Signed)
New and old EKG given to Dr. Dorna Mai. Copies placed in pt chart.

## 2013-01-03 NOTE — ED Notes (Signed)
Patient received call from doctor's office and advised that patient needs to come in and get rechecked.   His labs, specifically kidney function and potassium,  were off per doctors office.

## 2013-01-03 NOTE — ED Provider Notes (Signed)
History    CSN: [id number] Arrival date & time 01/03/13  0846  First MD Initiated Contact with Patient 01/03/13 [phone]     Chief Complaint  Patient presents with  . Abnormal Lab   (Consider location/radiation/quality/duration/timing/severity/associated sxs/prior Treatment) HPI Comments: Pt was seen in clinic yesterday by Dr. [name] with [facility] cardiology, had routine labs drawn.  His labs returned last night with critical values of elevated K+ and elevated Cr of 4.  Pt was contacted early this AM and instructed to come to the ED immediately.    The history is provided by the patient and medical records.   Past Medical History  Diagnosis Date  . Hypertension   . Hyperlipidemia   . OSA (obstructive sleep apnea)     on cpap  . Prediabetes   . CHF (congestive heart failure) 07/2012; 10/17/2012  . COPD (chronic obstructive pulmonary disease)     a.  History of heavy smoking. PFTs (4/14) with mixed obstructive (COPD) and restrictive (post-ARDS) picture.   . Pneumonia 07/2012    PNA (H1N1 influenza + pneumococcus) in [id number] complicated by respiratory failure/ARDS. Required tracheostomy, now weaned off. Had left empyema requiring chest tube.   . Diabetes mellitus without complication   . History of blood transfusion     "lots since January" (10/17/2012)  . Anxiety   . Ischemic cardiomyopathy   . Anemia of chronic disease   . Prostate cancer      s/p radiation treatment. Has indwelling foley.   . Aortic stenosis     Moderate to severe by echo in 4/14. Bioprosthetic #21 [facility] Ease aortic valve replacement in 4/14.   Marland Kitchen CAD (coronary artery disease)     Moderate to severe by echo in 4/14. Bioprosthetic #21 [facility] Ease aortic valve replacement in 4/14.   . Carotid arterial disease     Carotid dopplers (0000000) with A999333 LICA stenosis.   Marland Kitchen AAA (abdominal aortic aneurysm)     5/14 CT showed > 6 cm AAA, also right iliac aneurysm. Not stent graft candidate.   . Esophageal reflux     Past Surgical History  Procedure Laterality Date  . Transurethral microwave therapy  10/15/2012  . Nasal fracture surgery  1970's  . Tracheostomy  07/2012  . Tracheostomy closure  08/2012  . Anal fissure repair  2008  . Coronary artery bypass graft N/A 10/25/2012    Procedure: CORONARY ARTERY BYPASS GRAFTING (CABG);  Surgeon: Melrose Nakayama, MD;  Location: [place];  Service: Open Heart Surgery;  Laterality: N/A;  times 4 using left internal mammary artery and endoscopically harvested bilateral saphenous vein   . Aortic valve replacement N/A 10/25/2012    Procedure: AORTIC VALVE REPLACEMENT (AVR);  Surgeon: Melrose Nakayama, MD;  Location: [place];  Service: Open Heart Surgery;  Laterality: N/A;  . Intraoperative transesophageal echocardiogram N/A 10/25/2012    Procedure: INTRAOPERATIVE TRANSESOPHAGEAL ECHOCARDIOGRAM;  Surgeon: Melrose Nakayama, MD;  Location: [place];  Service: Open Heart Surgery;  Laterality: N/A;   Family History  Problem Relation Age of Onset  . Congestive Heart Failure    . Heart disease     History  Substance Use Topics  . Smoking status: Former Smoker -- 2.00 packs/day for 58 years    Types: Cigarettes    Quit date: 07/20/2012  . Smokeless tobacco: Never Used  . Alcohol Use: No     Comment: 10/17/2012 "years since I've had a drink; never had problem with it"  Review of Systems  Allergies  Cephalosporins  Home Medications   Current Outpatient Rx  Name  Route  Sig  Dispense  Refill  . aspirin EC 325 MG EC tablet   Oral   Take 1 tablet (325 mg total) by mouth daily.   30 tablet      . atorvastatin (LIPITOR) 80 MG tablet   Oral   Take 1 tablet (80 mg total) by mouth daily.   30 tablet   6   . carvedilol (COREG) 3.125 MG tablet   Oral   Take 1 tablet (3.125 mg total) by mouth 2 (two) times daily.         . clonazePAM (KLONOPIN) 0.5 MG tablet   Oral   Take 1 tablet (0.5 mg total) by mouth 2 (two) times daily as needed (anxiety).   60  tablet   3   . hydrALAZINE (APRESOLINE) 10 MG tablet   Oral   Take 1 tablet (10 mg total) by mouth 3 (three) times daily.   90 tablet   6   . hydrocortisone cream 1 %   Topical   Apply 1 application topically daily.         Marland Kitchen ibuprofen (ADVIL,MOTRIN) 200 MG tablet   Oral   Take 600 mg by mouth as needed for pain or headache.         . ipratropium-albuterol (DUONEB) 0.5-2.5 (3) MG/3ML SOLN   Nebulization   Take 3 mLs by nebulization every 6 (six) hours.         . isosorbide mononitrate (IMDUR) 30 MG 24 hr tablet   Oral   Take 1 tablet (30 mg total) by mouth daily.   30 tablet   6   . nitroGLYCERIN (NITROSTAT) 0.4 MG SL tablet   Sublingual   Place 1 tablet (0.4 mg total) under the tongue every 5 (five) minutes as needed for chest pain.   25 tablet   12   . pantoprazole (PROTONIX) 40 MG tablet   Oral   Take 1 tablet by mouth daily.         . potassium chloride (KLOR-CON 10) 10 MEQ tablet   Oral   Take 10 mEq by mouth 2 (two) times daily.          . sitaGLIPtin (JANUVIA) 100 MG tablet   Oral   Take 100 mg by mouth daily.         Marland Kitchen torsemide (DEMADEX) 20 MG tablet      3 tablets (total 60mg ) two times a day          BP 100/67  Pulse 82  Temp(Src) 97 F (36.1 C) (Oral)  Resp 15  SpO2 99% Physical Exam  ED Course  Procedures (including critical care time)   CRITICAL CARE Performed by: Donzetta Matters Total critical care time: 30 min Critical care time was exclusive of separately billable procedures and treating other patients. Critical care was necessary to treat or prevent imminent or life-threatening deterioration. Critical care was time spent personally by me on the following activities: development of treatment plan with patient and/or surrogate as well as nursing, discussions with consultants, evaluation of patient's response to treatment, examination of patient, obtaining history from patient or surrogate, ordering and performing  treatments and interventions, ordering and review of laboratory studies, ordering and review of radiographic studies, pulse oximetry and re-evaluation of patient's condition.   Labs Reviewed  CBC WITH DIFFERENTIAL - Abnormal; Notable for the following:    RBC  3.31 (*)    Hemoglobin 9.1 (*)    HCT 29.1 (*)    RDW 20.5 (*)    Lymphocytes Relative 10 (*)    Eosinophils Relative 20 (*)    Eosinophils Absolute 1.5 (*)    All other components within normal limits  BASIC METABOLIC PANEL - Abnormal; Notable for the following:    Sodium 130 (*)    Potassium 6.5 (*)    BUN 93 (*)    Creatinine, Ser 4.73 (*)    GFR calc non Af Amer 11 (*)    GFR calc Af Amer 13 (*)    All other components within normal limits   No results found. 1. Hyperkalemia   2. Renal failure (ARF), acute on chronic     RA sat is 99% and I interpret to be normal  ECG at time 0907, SR at rate 87, RBBB, septal q waves, abn ECG, no sig changes compared to ECG from 11/21/12.   Pt is orthostatic, has history of same, is also anemic here at 9.1.  Pt's range over the past 2 months have been between 8.0 to 9.6.  10:27 AM Spoke to Dr. Jimmy Footman with nephrology and Dr. Sheran Fava with Hospitalist who will see and admit to tele. Pt is understanding of plan.  K+ is table, Cr is slightyl worse.  Needs close monitoring and Kayexalate for now.   MDM  Pt with h/o renal insufficiency, had angio CT with contrast on 7/1, apparently had worsening creatinine based on yesterday's labs, possibly hyperkalemia as well.  Notified at home, told to come to the ED.  Pt ahs no current symptoms, VS are otherwise ok.  No CP, SOB, palpitations, syncopal symptoms.    Saddie Benders. Taegen Delker, MD 01/03/13 1028

## 2013-01-03 NOTE — Consult Note (Signed)
Reason for Consult:AKI/CKD Referring Physician: ER MD  [name] is an 72 y.o. male.  HPI: 72 yr male with 5-6 yr hx Type 2 DM, long term HTN, hx BPH with microwave tx, hx OSA, COPD with 75pk yr hx, severe resp failure with ARDS, trach,empyema from H1 N1 in 1/14, CABG and AVF 4/14, hosp  In 5/144 and 6/14 with CHF, 6-8cm AAA who had Cr 2.2 6/27, and received contrasted CT angio 7/2.  Cr yest 4.2 ;and now 4.7. Has k 6.5, adn bicarf 21..  Chronic anemia.  Cr has varied .8-1.8 in the past 6 mon.  Also on Lisinopril as outpatient.  No FH of renal dz, inherited hearing, eye or ms skel defects.  He has hearing aids.  Nocturia usually every 2 h at night but less past 2 night.  Denies voiding sx,hematuria. Hx stones x 2 40 yr ago. Father had HTN and died of CHF @ 29,? Renal disease. Mother and sister with HTN and sister with DM. Constitutional: wgt a prob long term., SOB Eyes: cannot see without glasses Ears, nose, mouth, throat, and face: negative Respiratory: home O2 off and on , CPAP, still cough but no smoking since 1/14 Cardiovascular: 3 pillows, edema and wgt gains forcing hosp freq Gastrointestinal: negative Genitourinary:as above Integument/breast: reddened diffusely, not focal Hematologic/lymphatic: chronic anemia Musculoskeletal:negative Neurological: negative Endocrine: only on meds for DM recently Allergic/Immunologic: Cephalosporins, dye  Past Medical History  Diagnosis Date  . Hypertension   . Hyperlipidemia   . OSA (obstructive sleep apnea)     on cpap  . Prediabetes   . CHF (congestive heart failure) 07/2012; 10/17/2012  . COPD (chronic obstructive pulmonary disease)     a.  History of heavy smoking. PFTs (4/14) with mixed obstructive (COPD) and restrictive (post-ARDS) picture.   . Pneumonia 07/2012    PNA (H1N1 influenza + pneumococcus) in [id number] complicated by respiratory failure/ARDS. Required tracheostomy, now weaned off. Had left empyema requiring chest tube.   .  Diabetes mellitus without complication   . History of blood transfusion     "lots since January" (10/17/2012)  . Anxiety   . Ischemic cardiomyopathy   . Anemia of chronic disease   . Prostate cancer      s/p radiation treatment. Has indwelling foley.   . Aortic stenosis     Moderate to severe by echo in 4/14. Bioprosthetic #21 [facility] Ease aortic valve replacement in 4/14.   Marland Kitchen CAD (coronary artery disease)     Moderate to severe by echo in 4/14. Bioprosthetic #21 [facility] Ease aortic valve replacement in 4/14.   . Carotid arterial disease     Carotid dopplers (0000000) with A999333 LICA stenosis.   Marland Kitchen AAA (abdominal aortic aneurysm)     5/14 CT showed > 6 cm AAA, also right iliac aneurysm. Not stent graft candidate.   . Esophageal reflux     Past Surgical History  Procedure Laterality Date  . Transurethral microwave therapy  10/15/2012  . Nasal fracture surgery  1970's  . Tracheostomy  07/2012  . Tracheostomy closure  08/2012  . Anal fissure repair  2008  . Coronary artery bypass graft N/A 10/25/2012    Procedure: CORONARY ARTERY BYPASS GRAFTING (CABG);  Surgeon: Melrose Nakayama, MD;  Location: [place];  Service: Open Heart Surgery;  Laterality: N/A;  times 4 using left internal mammary artery and endoscopically harvested bilateral saphenous vein   . Aortic valve replacement N/A 10/25/2012    Procedure: AORTIC VALVE  REPLACEMENT (AVR);  Surgeon: Melrose Nakayama, MD;  Location: [place];  Service: Open Heart Surgery;  Laterality: N/A;  . Intraoperative transesophageal echocardiogram N/A 10/25/2012    Procedure: INTRAOPERATIVE TRANSESOPHAGEAL ECHOCARDIOGRAM;  Surgeon: Melrose Nakayama, MD;  Location: [place];  Service: Open Heart Surgery;  Laterality: N/A;    Family History  Problem Relation Age of Onset  . Congestive Heart Failure    . Heart disease      Social History:  reports that he quit smoking about 5 months ago. His smoking use included Cigarettes. He has a 116  pack-year smoking history. He has never used smokeless tobacco. He reports that he does not drink alcohol or use illicit drugs.  Allergies:  Allergies  Allergen Reactions  . Cephalosporins Rash    Blisters    Medications:  I have reviewed the patient's current medications. Prior to Admission:  Prescriptions prior to admission  Medication Sig Dispense Refill  . aspirin EC 325 MG EC tablet Take 1 tablet (325 mg total) by mouth daily.  30 tablet    . atorvastatin (LIPITOR) 80 MG tablet Take 1 tablet (80 mg total) by mouth daily.  30 tablet  6  . carvedilol (COREG) 3.125 MG tablet Take 1 tablet (3.125 mg total) by mouth 2 (two) times daily.      . clonazePAM (KLONOPIN) 0.5 MG tablet Take 1 tablet (0.5 mg total) by mouth 2 (two) times daily as needed (anxiety).  60 tablet  3  . hydrALAZINE (APRESOLINE) 10 MG tablet Take 1 tablet (10 mg total) by mouth 3 (three) times daily.  90 tablet  6  . hydrocortisone cream 1 % Apply 1 application topically daily.      Marland Kitchen ibuprofen (ADVIL,MOTRIN) 200 MG tablet Take 600 mg by mouth as needed for pain or headache.      . ipratropium-albuterol (DUONEB) 0.5-2.5 (3) MG/3ML SOLN Take 3 mLs by nebulization every 6 (six) hours.      . isosorbide mononitrate (IMDUR) 30 MG 24 hr tablet Take 1 tablet (30 mg total) by mouth daily.  30 tablet  6  . nitroGLYCERIN (NITROSTAT) 0.4 MG SL tablet Place 1 tablet (0.4 mg total) under the tongue every 5 (five) minutes as needed for chest pain.  25 tablet  12  . pantoprazole (PROTONIX) 40 MG tablet Take 1 tablet by mouth daily.      . potassium chloride (KLOR-CON 10) 10 MEQ tablet Take 10 mEq by mouth 2 (two) times daily.       . sitaGLIPtin (JANUVIA) 100 MG tablet Take 100 mg by mouth daily.      Marland Kitchen torsemide (DEMADEX) 20 MG tablet 3 tablets (total 60mg ) two times a day        Results for orders placed during the hospital encounter of 01/03/13 (from the past 48 hour(s))  CBC WITH DIFFERENTIAL     Status: Abnormal   Collection  Time    01/03/13  9:15 AM      Result Value Range   WBC 7.8  4.0 - 10.5 K/uL   RBC 3.31 (*) 4.22 - 5.81 MIL/uL   Hemoglobin 9.1 (*) 13.0 - 17.0 g/dL   HCT 29.1 (*) 39.0 - 52.0 %   MCV 87.9  78.0 - 100.0 fL   MCH 27.5  26.0 - 34.0 pg   MCHC 31.3  30.0 - 36.0 g/dL   RDW 20.5 (*) 11.5 - 15.5 %   Platelets 242  150 - 400 K/uL   Neutrophils  Relative % 62  43 - 77 %   Neutro Abs 4.8  1.7 - 7.7 K/uL   Lymphocytes Relative 10 (*) 12 - 46 %   Lymphs Abs 0.8  0.7 - 4.0 K/uL   Monocytes Relative 8  3 - 12 %   Monocytes Absolute 0.6  0.1 - 1.0 K/uL   Eosinophils Relative 20 (*) 0 - 5 %   Eosinophils Absolute 1.5 (*) 0.0 - 0.7 K/uL   Basophils Relative 0  0 - 1 %   Basophils Absolute 0.0  0.0 - 0.1 K/uL  BASIC METABOLIC PANEL     Status: Abnormal   Collection Time    01/03/13  9:15 AM      Result Value Range   Sodium 130 (*) 135 - 145 mEq/L   Potassium 6.5 (*) 3.5 - 5.1 mEq/L   Comment: CRITICAL RESULT CALLED TO, READ BACK BY AND VERIFIED WITH:     BROWN,M RN @ 1001 ON 01/03/13 BY LEONARD,A   Chloride 96  96 - 112 mEq/L   CO2 21  19 - 32 mEq/L   Glucose, Bld 94  70 - 99 mg/dL   BUN 93 (*) 6 - 23 mg/dL   Creatinine, Ser 4.73 (*) 0.50 - 1.35 mg/dL   Calcium 9.2  8.4 - 10.5 mg/dL   GFR calc non Af Amer 11 (*) >90 mL/min   GFR calc Af Amer 13 (*) >90 mL/min   Comment:            The eGFR has been calculated     using the CKD EPI equation.     This calculation has not been     validated in all clinical     situations.     eGFR's persistently     <90 mL/min signify     possible Chronic Kidney Disease.    No results found.  @ROS @ Blood pressure 122/78, pulse 85, temperature 97 F (36.1 C), temperature source Oral, resp. rate 15, SpO2 100.00%. @PHYSEXAMBYAGE2 @ Physical Examination: General appearance - overweight, in mild to moderate distress and erythematous skin, dyspneic,  Mental status - alert, oriented to person, place, and time Eyes - pupils equal and reactive, extraocular eye  movements intact, funduscopic exam normal, discs flat and sharp Ears - scarring both TMS Nose - normal and patent, no erythema, discharge or polyps Mouth - mucous membranes moist, pharynx normal without lesions and scattered teeth Neck - adenopathy noted PCL Lymphatics - posterior cervical nodes Chest - rales noted bibasilar, decreased air entry noted bilat, rhonchi noted bilat Heart - S1 and S2 normal, systolic murmur [id number] at 2nd left intercostal space R carotid bruit, bilat fem bruits, no DP Abdomen - obese , pos bs,liver down4 cm,  Musculoskeletal - no joint tenderness, deformity or swelling Extremities - pedal edema 2 + trophic changes LE Skin - diffuse erythema, all over, trophic  Changes LE  Assessment/Plan: 1 CKD/AKI  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  AKI dye primary vs idiosyncratic with allergic rx. Has vol xs, anemia, acidemia, ^ K.Marland Kitchen Need to control K, limit vol and secondary complic.  Role of aneurysm ?? And r/o obstruct 2 CAD 3 AVR 4. Anemia suspect renal plus 5. Metabolic Bone Disease: check PTH 6 Obesity needs wgt loss 7 AAA need to f.u closely and ? Role in current prob 8 Allergic Rx to Dye  9 Prostate Dz, look at u/s 10 COPD 11 OSA 12 Depressed EF P limit fluids, check  FE/PTH, check eos, UA, control K,    Meredyth Hornung L 01/03/2013, 12:09 PM

## 2013-01-03 NOTE — Progress Notes (Signed)
Patient ID: [name], male   DOB: [birth year], 72 y.o.   MRN: [id number] PCP: Dr. [name]  72 yo with history of ARDS/respiratory failure and tracheostomy in 1/14 as well as DM, HTN, and COPD presented to the ER at [facility] with CHF in 4/14. Patient had a prolonged hospitalization in 1/14 with H1N1 influenza and pneumococcal PNA. This progressed to ARDS. He was intubated and ended up with tracheostomy. He has had the trach removed. He also had a left empyema requiring chest tube, septic shock with elevated troponin, and AKI which resolved.   He was admitted again in 4/14 from his nursing home with exertional dyspnea and orthopnea. He had gained 21 lbs.  Echo at admission showed EF 35% with global hypokinesis that looked worse in the anteroseptal wall. He also was noted to have aortic stenosis rated as moderate to severe. He was diuresed and cathed, showing severe 3 vessel disease.  He then had CABG-AVR (bioprosthetic valve).  He did well post-op considering his comorbidities and is now home with his wife.  Echo in 5/14 prior to discharge showed EF up to 50% with well-seated aortic valve.    At last appointment, patient was noted to be volume overloaded with 17 lb weight gain.  He was admitted and diuresed, losing > 10 lbs.  He had a pulmonary evaluation given persistent hypoxemia, which was thought to be due to COPD and post-infectious pulmonary fibrosis.  Most recently, creatinine increased to 2.2 and torsemide was cut back.  He has gained back 10 lbs since leaving the hospital.  He is more short of breath and is using his home oxygen.    Of note, patient had recent vascular surgery followup of his AAA.  He had a CT abdomen to follow his AAA on earlier this week, showing 6.9 x 6.8 cm aneurysm.  Dr. Kellie Simmering told him that he was not yet physically ready for surgery, will followup in the future. Unfortunately, the CT was done with contrast.  After getting the contrast, patient developed diffuse leg  redness.    ECG: NSR, RBBB, LAFB  Labs (5/14): K 3.6, creatinine 0.86, BNP 11508 Labs (6/14): K 4, creatinine 1.1 => 1.53 => 2.2, BNP 1380 => 79  PMH: 1. PNA (H1N1 influenza + pneumococcus) in [id number] complicated by respiratory failure/ARDS. Required tracheostomy, now weaned off. Had left empyema requiring chest tube. Now with post-infectious pulmonary fibrosis.  2. Prostate CA s/p radiation treatment. Has indwelling foley.  3. OSA  4. HTN  5. Hyperlipidemia  6. COPD: History of heavy smoking.  PFTs (4/14) with mixed obstructive (COPD) and restrictive (post-ARDS) picture.   7. Type II diabetes  8. Anemia of chronic disease.  9. Ischemic cardiomyopathy: Echo (1/14) with severely dilated LV, EF 30-35%, diffuse hypokinesis worse in the anteroseptal wall, grade II diastolic dysfunction, mild MR, AS interpreted as "moderate to severe" with aortic valve mean gradient 23 mmHg. Echo (4/14) witih EF 35-40%, mid to apical anteroseptal akinesis, moderate to severe AS with mean gradient 33 mmHg, AVA 0.91 cm^2.  Echo (5/14) post CABG showed EF 50%, anteroseptal hypokinesis, bioprosthetic aortic valve well-seated.  10. Aortic stenosis: Moderate to severe by echo in 4/14.  Bioprosthetic #21 [facility] Ease aortic valve replacement in 4/14.   11. CAD: LHC (4/14) with 95% pLAD, 70-80% ostial ramus, 70% mLCx, 90% pRCA, EF 35%.  CABG 4/14 with LIMA-LAD, seq SVG-ramus and OM1, SVG-PDA.  12. Carotid stenosis: Carotid dopplers (0000000) with A999333 LICA stenosis.  13. AAA: 6/14 CT showed 6.9 x 6.8 cm AAA, also right iliac aneurysm.  Not stent graft candidate.   SH: Quit smoking in 1/14, married, no ETOH, lives in [place].  FH: No premature CAD  ROS: All systems reviewed and negative except as per HPI.   No current facility-administered medications for this visit.   Current Outpatient Prescriptions  Medication Sig Dispense Refill  . aspirin EC 325 MG EC tablet Take 1 tablet (325 mg total) by mouth daily.  30  tablet    . atorvastatin (LIPITOR) 80 MG tablet Take 1 tablet (80 mg total) by mouth daily.  30 tablet  6  . carvedilol (COREG) 3.125 MG tablet Take 1 tablet (3.125 mg total) by mouth 2 (two) times daily.      . clonazePAM (KLONOPIN) 0.5 MG tablet Take 1 tablet (0.5 mg total) by mouth 2 (two) times daily as needed (anxiety).  60 tablet  3  . ipratropium-albuterol (DUONEB) 0.5-2.5 (3) MG/3ML SOLN Take 3 mLs by nebulization every 6 (six) hours.      . nitroGLYCERIN (NITROSTAT) 0.4 MG SL tablet Place 1 tablet (0.4 mg total) under the tongue every 5 (five) minutes as needed for chest pain.  25 tablet  12  . pantoprazole (PROTONIX) 40 MG tablet Take 1 tablet by mouth daily.      . potassium chloride (KLOR-CON 10) 10 MEQ tablet Take 10 mEq by mouth 2 (two) times daily.       . sitaGLIPtin (JANUVIA) 100 MG tablet Take 100 mg by mouth daily.      . hydrALAZINE (APRESOLINE) 10 MG tablet Take 1 tablet (10 mg total) by mouth 3 (three) times daily.  90 tablet  6  . hydrocortisone cream 1 % Apply 1 application topically daily.      Marland Kitchen ibuprofen (ADVIL,MOTRIN) 200 MG tablet Take 600 mg by mouth as needed for pain or headache.      . isosorbide mononitrate (IMDUR) 30 MG 24 hr tablet Take 1 tablet (30 mg total) by mouth daily.  30 tablet  6  . torsemide (DEMADEX) 20 MG tablet 3 tablets (total 60mg ) two times a day       Facility-Administered Medications Ordered in Other Visits  Medication Dose Route Frequency Provider Last Rate Last Dose  . 0.9 %  sodium chloride infusion   Intravenous Once Saddie Benders. Ghim, MD        BP 92/52  Pulse 89  Ht 5\' 6"  (1.676 m)  Wt 212 lb (96.163 kg)  BMI 34.23 kg/m2 General: NAD Neck: JVP 10 cm, no thyromegaly or thyroid nodule.  Lungs: Distant breath sounds.  CV: Nondisplaced PMI.  Heart regular S1/S2 with wide split of S2, no S3/S4, no murmur.  1+ edema to knees bilaterally.  No carotid bruit.  Normal pedal pulses.  Abdomen: Soft, nontender, no hepatosplenomegaly, no  distention.  Skin: Intact without lesions or rashes.  Neurologic: Alert and oriented x 3.  Psych: Normal affect. Extremities: No clubbing or cyanosis.   Assessment/Plan: 1. CAD: Status post CABG for 3VD.  No chest pain   - When CHF is improved, will have him do cardiac rehab at the hospital in [place].  - Continue ASA, statin.  - He is taking Coreg at 3.125 mg bid which I will continue.   2. Chronic systolic CHF: EF around [id number] prior to surgery, appeared to improve post-op with last echo showing EF around 50%.  NYHA class IIIb symptoms at this point.  He has  gained 10 lbs since recent hospitalization for acute on chronic CHF and appears volume overloaded.  I recently cut back on his torsemide due to elevated creatinine to 2.2. Unfortunately, he had contrast with his CT abdomen earlier this week and I am very concerned for contrast-induced nepropathy.   - For now, will increase torsemide to 60 mg bid as he is clearly volume overloaded and short of breath.  - Continue low dose of Coreg.  - Stop lisinopril and start hydralazine 10 mg tid and imdur 30 mg daily.   - Repeat echo in 8/14, 3 months post-surgery.  3. Pulmonary: Mixed restrictive (post-ARDS pulmonary fibrosis)/obstructive (COPD) picture.  I suspect that intrinsic lung disease is a contributor to his dyspnea and hypoxemia.  Continue home oxygen, he will have pulmonary followup.   4. Carotid stenosis: Repeat carotid dopplers in 10/14.   5. Hyperlipidemia: Continue statin, will need lipids checked in the future.   6. 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  He is not a candidate for stent grafting.  He will need to stabilize out/recover from CABG-AVR prior to vascular surgery.  He has been following with Dr. Kellie Simmering for this.  7. AKI on CKD: Creatinine recently higher since hospital admission with diuresis.  He had a CT abdomen earlier this week at which he got contrast.  I am very concerned that creatinine could have worsened.   I will get a BMET today.   8. Status post bioprosthetic AVR in 4/14.   He will need very close followup.  I will arrange followup next week with the CHF clinic while I am on vacation and then with me when I return.   Loralie Champagne 01/02/13

## 2013-01-03 NOTE — Plan of Care (Signed)
Problem: Food- and Nutrition-Related Knowledge Deficit (NB-1.1) Goal: Nutrition education Formal process to instruct or train a patient/client in a skill or to impart knowledge to help patients/clients voluntarily manage or modify food choices and eating behavior to maintain or improve health. Outcome: Completed/Met Date Met:  01/03/13 Nutrition Education Note  Noted pt with high potassium. Pt reports having been taking 40 mEq of potassium daily due to low potassium PTA. Pt has also been eating at least one banana daily PTA. Pt confused why his potassium is now restricted. Discussed this with pt.   RD consulted for Renal Education. Provided Renal food guide pyramid to patient/family. Reviewed food groups which are high in potassium and provided written recommended serving sizes specifically determined for patient's current nutritional status.   Explained why diet restrictions are needed and provided lists of foods to limit/avoid that are high potassium. Provided specific recommendations on safer alternatives of these foods. Strongly encouraged compliance of this diet.   Gave pt menu and encouraged pt to order his meals.   Teach back method used.  Expect good compliance.  Current diet order is Renal, patient is consuming approximately 100% of meals at this time. Labs and medications reviewed. No further nutrition interventions warranted at this time. RD contact information provided. If additional nutrition issues arise, please re-consult RD.  [place], [place], [place] Pager [phone] After Hours Pager

## 2013-01-04 ENCOUNTER — Inpatient Hospital Stay (HOSPITAL_COMMUNITY): Payer: BC Managed Care – PPO

## 2013-01-04 DIAGNOSIS — F411 Generalized anxiety disorder: Secondary | ICD-10-CM

## 2013-01-04 DIAGNOSIS — N179 Acute kidney failure, unspecified: Secondary | ICD-10-CM

## 2013-01-04 DIAGNOSIS — I5033 Acute on chronic diastolic (congestive) heart failure: Secondary | ICD-10-CM

## 2013-01-04 DIAGNOSIS — I509 Heart failure, unspecified: Secondary | ICD-10-CM

## 2013-01-04 DIAGNOSIS — I959 Hypotension, unspecified: Secondary | ICD-10-CM | POA: Diagnosis present

## 2013-01-04 LAB — COMPREHENSIVE METABOLIC PANEL
ALT: 15 U/L (ref 0–53)
Albumin: 2.9 g/dL — ABNORMAL LOW (ref 3.5–5.2)
Alkaline Phosphatase: 82 U/L (ref 39–117)
Calcium: 8.6 mg/dL (ref 8.4–10.5)
GFR calc Af Amer: 18 mL/min — ABNORMAL LOW (ref >90)
Glucose, Bld: 91 mg/dL (ref 70–99)
Potassium: 4.8 mEq/L (ref 3.5–5.1)
Sodium: 136 mEq/L (ref 135–145)
Total Protein: 6.5 g/dL (ref 6.0–8.3)

## 2013-01-04 LAB — CBC
MCHC: 31.5 g/dL (ref 30.0–36.0)
Platelets: 218 10*3/uL (ref 150–400)
RDW: 20.4 % — ABNORMAL HIGH (ref 11.5–15.5)
WBC: 6 10*3/uL (ref 4.0–10.5)

## 2013-01-04 LAB — GLUCOSE, CAPILLARY: Glucose-Capillary: 82 mg/dL (ref 70–99)

## 2013-01-04 LAB — PHOSPHORUS: Phosphorus: 6.9 mg/dL — ABNORMAL HIGH (ref 2.3–4.6)

## 2013-01-04 MED ORDER — SODIUM CHLORIDE 0.9 % IV BOLUS (SEPSIS)
250.0000 mL | Freq: Once | INTRAVENOUS | Status: AC
  Administered 2013-01-04: 250 mL via INTRAVENOUS

## 2013-01-04 MED ORDER — ALBUTEROL SULFATE (5 MG/ML) 0.5% IN NEBU
2.5000 mg | INHALATION_SOLUTION | Freq: Three times a day (TID) | RESPIRATORY_TRACT | Status: DC
  Administered 2013-01-04 – 2013-01-05 (×2): 2.5 mg via RESPIRATORY_TRACT
  Filled 2013-01-04 (×2): qty 0.5

## 2013-01-04 MED ORDER — IPRATROPIUM BROMIDE 0.02 % IN SOLN
0.5000 mg | Freq: Three times a day (TID) | RESPIRATORY_TRACT | Status: DC
  Administered 2013-01-04 – 2013-01-05 (×2): 0.5 mg via RESPIRATORY_TRACT
  Filled 2013-01-04 (×2): qty 2.5

## 2013-01-04 MED ORDER — ALLOPURINOL 100 MG PO TABS
200.0000 mg | ORAL_TABLET | Freq: Every day | ORAL | Status: DC
  Administered 2013-01-04 – 2013-01-05 (×2): 200 mg via ORAL
  Filled 2013-01-04 (×2): qty 2

## 2013-01-04 MED ORDER — FERUMOXYTOL INJECTION 510 MG/17 ML
510.0000 mg | Freq: Once | INTRAVENOUS | Status: AC
  Administered 2013-01-04: 510 mg via INTRAVENOUS
  Filled 2013-01-04: qty 17

## 2013-01-04 MED ORDER — SODIUM CHLORIDE 0.9 % IV SOLN
200.0000 mg | INTRAVENOUS | Status: DC
  Filled 2013-01-04: qty 200

## 2013-01-04 NOTE — Progress Notes (Signed)
Patient ID: [name]  male  [id number]    DOB: 08/30/1940    DOA: 01/03/2013  PCP: [name], MD  Assessment/Plan: Principal Problem: Acute on chronic kidney disease. Baseline creatinine of 1.1-1.2 with stage III CKD. Given the patient's comorbidities he is likely not a good candidate for HD.  AKI is likely a combination of ATN secondary to diuretics, contrast induced nephropathy, and NSAID use. - Creatinine improving today  -  Appreciate nephrology recommendations  - Continue gentle hydration and holding diuretics for now.  - Urinalysis with numerous WBC's but few bacteria, likely he may have urine eosinophilia from allergy to contrast. Will check U eos, ordered by renal. If positive, will need steroids.     CAD: Status post CABG for 3VD.  - Continue aspirin and statin. Hold beta blocker and Imdur d/t hypotension.   Chronic systolic CHF: EF [id number] prior to surgery, appeared to improve post-op with last echo showing EF around 50%. NYHA class IIIb symptoms at this point. He has gained 10 lbs.  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  - Continue Duo nebs, oxygen   OSA, chronic, stable. Continue CPAP at night.   Diabetes mellitus: - Hold januvia  - Continue sensitive sliding scale insulin    Carotid stenosis, chronic, stable: Repeat carotid dopplers in 10/14.   Hyperlipidemia, Chronic, stable. 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 See report from 7/1.  - per cardiology he is not a candidate for stent grafting, follows outpatient Dr. 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 Likely due to CKD  DVT Prophylaxis: lovenox  Code Status:  Disposition:    Subjective: No specific complaints, wants breakfast, sitting up in chair   Objective: Weight change:   Intake/Output Summary (Last 24 hours) at 01/04/13 0958 Last data filed at 01/04/13 0426  Gross per 24 hour  Intake 847.08 ml  Output   1425 ml  Net -577.92 ml   Blood pressure 89/47, pulse 97, temperature 97.7 F (36.5 C), temperature source Oral, resp. rate 20, height 5\' 6"  (1.676 m), weight 94.5 kg (208 lb 5.4 oz), SpO2 98.00%.  Physical Exam: General: Alert and awake, oriented x3, not in any acute distress. CVS: S1-S2 clear, 2/6 SEM Chest: dec at the bases Abdomen: obese, soft nontender, ND, NBS Extremities: no cyanosis, clubbing. 2+ edema noted bilaterally   Lab Results: Basic Metabolic Panel:  Recent Labs Lab 01/03/13 1706 01/04/13 0415  NA 134* 136  K 5.6* 4.8  CL 100 101  CO2 24 23  GLUCOSE 98 91  BUN 90* 89*  CREATININE 4.23* 3.62*  CALCIUM 8.7 8.6  PHOS 6.6* 6.9*   Liver Function Tests:  Recent Labs Lab 01/03/13 1706 01/04/13 0415  AST  --  16  ALT  --  15  ALKPHOS  --  82  BILITOT  --  0.4  PROT  --  6.5  ALBUMIN 2.9* 2.9*   No results found for this basename: LIPASE, AMYLASE,  in the last 168 hours No results found for this basename: AMMONIA,  in the last 168  hours CBC:  Recent Labs Lab 01/03/13 1301 01/04/13 0415  WBC 6.7 6.0  NEUTROABS 3.9  --   HGB 9.3* 8.0*  HCT 29.5* 25.4*  MCV 88.9 88.5  PLT 250 218   Cardiac Enzymes: No results found for this basename: CKTOTAL, CKMB, CKMBINDEX, TROPONINI,  in the last 168 hours BNP: No components found with this basename: POCBNP,  CBG:  Recent Labs Lab 01/03/13 1235 01/03/13 1720 01/03/13 2119 01/04/13 0802  GLUCAP 84 86 108* 82     Micro Results: No results found for this or any previous visit (from the past  240 hour(s)).  Studies/Results: Dg Chest 2 View  12/20/2012   *RADIOLOGY REPORT*  Clinical Data: Short of breath, fluid retention  CHEST - 2 VIEW  Comparison: Chest radiograph 12/18/2012  Findings: Sternotomy wires overlie normal cardiac silhouette. There is bilateral air space disease not changed from prior.  Trace pleural effusions are noted.  No focal consolidation.  IMPRESSION: Bilateral fine air space disease suggests pulmonary edema.  Cannot exclude atypical infection.   Original Report Authenticated By: Suzy Bouchard, M.D.   Dg Chest 2 View  12/18/2012   *RADIOLOGY REPORT*  Clinical Data: Short of breath  CHEST - 2 VIEW  Comparison: 12/03/2012  Findings: Prior CABG and aortic valve replacement.  Heart size is prominent.  Mild vascular congestion is unchanged.  Negative for edema or infiltrate.  Small pleural effusions bilaterally.  IMPRESSION: Mild vascular congestion and small pleural effusions, unchanged. Probable mild chronic congestive heart failure.   Original Report Authenticated By: Carl Best, M.D.   US Renal  01/03/2013   *RADIOLOGY REPORT*  Clinical Data:  Acute kidney injury  RENAL/URINARY TRACT ULTRASOUND COMPLETE  Comparison:  None.  Findings:  Right Kidney:  Measures 11.3 cm.  Diffusely echogenic. No evidence of mass or hydronephrosis. Cyst is identified within the upper pole measuring 1.3 cm.  Left Kidney:  Appears echogenic measuring 11.3 cm in length. No evidence of mass or hydronephrosis. Upper pole cyst measures 2 cm.  Bladder:  Appears normal for degree of bladder distention.  Abdominal aortic aneurysm is again noted.  IMPRESSION:  1.  Bilateral echogenic kidneys compatible with medical renal disease. 2.  No hydronephrosis. 3.  Abdominal aortic aneurysm.   Original Report Authenticated By: Kerby Moors, M.D.   Portable Chest 1 View  01/04/2013   *RADIOLOGY REPORT*  Clinical Data: History of coronary artery disease and CHF  PORTABLE CHEST - 1 VIEW  Comparison: 12/02/2012   Findings: The patient is status post median sternotomy and CABG procedure.  There is mild cardiac enlargement.  No pleural effusion identified.  Pulmonary vascular congestion appears similar to previous exam.  IMPRESSION:  1.  Cardiac enlargement and pulmonary vascular congestion.   Original Report Authenticated By: Kerby Moors, M.D.   Ct Angio Abd/pel W/ And/or W/o  12/31/2012   *RADIOLOGY REPORT*  Clinical Data:  Preoperative evaluation for aneurysm stent placement  CT ANGIOGRAPHY ABDOMEN AND PELVIS  Technique:  Multidetector CT imaging of the abdomen and pelvis was performed using the standard protocol during bolus administration of intravenous contrast.  Multiplanar reconstructed images including MIPs were obtained and reviewed to evaluate the vascular anatomy.  Contrast: 32mL OMNIPAQUE IOHEXOL 350 MG/ML SOLN  Comparison:  07/21/2012  Findings:  Nodular changes are noted in the right lower lobe. These may be related to scarring from previous infiltrates is seen in January 2014.  Largest of these measures just under a centimeter best seen on image #5 of series 6.  The liver, spleen, adrenal glands in a and pancreas are all normal on the CT appearance.  The gallbladder is decompressed but demonstrate no cholelithiasis.  The kidneys are well visualized bilaterally and reveals some cystic change. The bladder is partially distended.  No pelvic mass lesion is seen.  Mild degenerative change of the lumbar spine is seen.  The celiac axis and superior mesenteric artery are widely patent. The inferior mesenteric artery is occluded at its origin but reconstituted via collaterals from the superior mesenteric artery. Single renal arteries are identified bilaterally.  Juxtarenal noncalcified plaque and mural thrombus is identified although no focal significant stenosis is seen.  Tortuosity of the aorta is noted at the level of the renal arteries as well.  An abdominal aortic aneurysm is identified which demonstrates  considerable mural thrombus in a concentric fashion.  It measures approximately 7.0 x 6.9 cm in greatest transverse and AP dimensions respectively. The length of the infrarenal neck is difficult to evaluate on this exam due to the tortuosity of the aorta in the infrarenal segment it appears to measure approximate 2.7 cm in length.  The diameter of the infrarenal aorta just below the renal arteries is approximate 25 mm with some considerable mural thrombus identified.  It continues to the level of the aortic bifurcation and there is a second aneurysm involving the right common iliac artery identified.  It measures approximately 4.9 cm in greatest dimension.  The iliac arteries are patent with mild calcific plaque.   Review of the MIP images confirms the above findings.  IMPRESSION: Abdominal aortic aneurysm as described above with associated right common iliac artery aneurysm.  No occlusion is identified.  Chronic changes as described.   Original Report Authenticated By: Inez Catalina, M.D.    Medications: Scheduled Meds: . ipratropium  0.5 mg Nebulization Q6H   And  . albuterol  2.5 mg Nebulization Q6H  . allopurinol  200 mg Oral Daily  . aspirin EC  325 mg Oral Daily  . atorvastatin  80 mg Oral Daily  . enoxaparin (LOVENOX) injection  30 mg Subcutaneous Q24H  . ferumoxytol  510 mg Intravenous Once  . hydrocortisone cream   Topical BID  . insulin aspart  0-9 Units Subcutaneous TID WC  . pantoprazole  40 mg Oral Daily  . sodium chloride  3 mL Intravenous Q12H  . sodium polystyrene  50 g Oral Once      LOS: 1 day   Vuong Musa M.D. [facility] 01/04/2013, 9:58 AM Pager: [phone]  If 7PM-7AM, please contact night-coverage [email or web address] Password [email or web address]

## 2013-01-04 NOTE — Progress Notes (Signed)
Patient has home CPAP at bedside and can place himself on CPAP with no assistance.

## 2013-01-04 NOTE — Progress Notes (Signed)
Subjective: Interval History: has complaints itching but less.  Objective: Vital signs in last 24 hours: Temp:  [97.6 F (36.4 C)-98.1 F (36.7 C)] 97.7 F (36.5 C) (07/05 0426) Pulse Rate:  [82-98] 97 (07/05 0426) Resp:  [15-20] 20 (07/05 0426) BP: (86-122)/(47-78) 89/47 mmHg (07/05 0426) SpO2:  [97 %-100 %] 98 % (07/05 0831) Weight:  [94.5 kg (208 lb 5.4 oz)] 94.5 kg (208 lb 5.4 oz) (07/04 2123) Weight change:   Intake/Output from previous day: 07/04 0701 - 07/05 0700 In: 847.1 [P.O.:720; I.V.:127.1] Out: 1425 [Urine:1425] Intake/Output this shift:    General appearance: alert, cooperative, moderately obese and pale Resp: diminished breath sounds bilaterally and rales bibasilar Cardio: S1, S2 normal, systolic murmur: systolic ejection 2/6, decrescendo at 2nd left intercostal space and prematures GI: obes pos bs, liver down 6 cm Extremities: edema 2+ Skin dry but not erythematous., erosions [id number]  Lab Results:  Recent Labs  01/03/13 1301 01/04/13 0415  WBC 6.7 6.0  HGB 9.3* 8.0*  HCT 29.5* 25.4*  PLT 250 218   BMET:  Recent Labs  01/03/13 1706 01/04/13 0415  NA 134* 136  K 5.6* 4.8  CL 100 101  CO2 24 23  GLUCOSE 98 91  BUN 90* 89*  CREATININE 4.23* 3.62*  CALCIUM 8.7 8.6   No results found for this basename: PTH,  in the last 72 hours Iron Studies:  Recent Labs  01/03/13 1301  IRON 55  TIBC 233    Studies/Results: US Renal  01/03/2013   *RADIOLOGY REPORT*  Clinical Data:  Acute kidney injury  RENAL/URINARY TRACT ULTRASOUND COMPLETE  Comparison:  None.  Findings:  Right Kidney:  Measures 11.3 cm.  Diffusely echogenic. No evidence of mass or hydronephrosis. Cyst is identified within the upper pole measuring 1.3 cm.  Left Kidney:  Appears echogenic measuring 11.3 cm in length. No evidence of mass or hydronephrosis. Upper pole cyst measures 2 cm.  Bladder:  Appears normal for degree of bladder distention.  Abdominal aortic aneurysm is again noted.   IMPRESSION:  1.  Bilateral echogenic kidneys compatible with medical renal disease. 2.  No hydronephrosis. 3.  Abdominal aortic aneurysm.   Original Report Authenticated By: Kerby Moors, M.D.   Portable Chest 1 View  01/04/2013   *RADIOLOGY REPORT*  Clinical Data: History of coronary artery disease and CHF  PORTABLE CHEST - 1 VIEW  Comparison: 12/02/2012  Findings: The patient is status post median sternotomy and CABG procedure.  There is mild cardiac enlargement.  No pleural effusion identified.  Pulmonary vascular congestion appears similar to previous exam.  IMPRESSION:  1.  Cardiac enlargement and pulmonary vascular congestion.   Original Report Authenticated By: Kerby Moors, M.D.    I have reviewed the patient's current medications. Prior to Admission:  Prescriptions prior to admission  Medication Sig Dispense Refill  . aspirin EC 325 MG EC tablet Take 1 tablet (325 mg total) by mouth daily.  30 tablet    . atorvastatin (LIPITOR) 80 MG tablet Take 1 tablet (80 mg total) by mouth daily.  30 tablet  6  . carvedilol (COREG) 3.125 MG tablet Take 1 tablet (3.125 mg total) by mouth 2 (two) times daily.      . clonazePAM (KLONOPIN) 0.5 MG tablet Take 1 tablet (0.5 mg total) by mouth 2 (two) times daily as needed (anxiety).  60 tablet  3  . hydrALAZINE (APRESOLINE) 10 MG tablet Take 1 tablet (10 mg total) by mouth 3 (three) times daily.  90 tablet  6  . hydrocortisone cream 1 % Apply 1 application topically daily.      Marland Kitchen ibuprofen (ADVIL,MOTRIN) 200 MG tablet Take 600 mg by mouth as needed for pain or headache.      . ipratropium-albuterol (DUONEB) 0.5-2.5 (3) MG/3ML SOLN Take 3 mLs by nebulization every 6 (six) hours.      . isosorbide mononitrate (IMDUR) 30 MG 24 hr tablet Take 1 tablet (30 mg total) by mouth daily.  30 tablet  6  . nitroGLYCERIN (NITROSTAT) 0.4 MG SL tablet Place 1 tablet (0.4 mg total) under the tongue every 5 (five) minutes as needed for chest pain.  25 tablet  12  .  pantoprazole (PROTONIX) 40 MG tablet Take 1 tablet by mouth daily.      . potassium chloride (KLOR-CON 10) 10 MEQ tablet Take 10 mEq by mouth 2 (two) times daily.       . sitaGLIPtin (JANUVIA) 100 MG tablet Take 100 mg by mouth daily.      Marland Kitchen torsemide (DEMADEX) 20 MG tablet 3 tablets (total 60mg ) two times a day        Assessment/Plan: 1CKD/AKI  Vol sx yet , K better, acid/base ok.  Low FeNa c/w CHF vs low bp.  Hold off much vol with xs on exam and hx and getting better.  [name] c/w allergic rx to contrast and suspect WBC in urine relating to that. If Cr not improving, give steroids.  Suspect contrast toxicity also.  Need to avoid ACEI, NSAIDs, and contrast.  Protein studies Pending.  ?if could have atheroembolic dz also. 2 Anemia give iv Fe 3 ^^uric acid give allopurinol 4 DM controlled 5 AAA serious issue 6 Obesity 7 COPD/OSA serious 8 HPTH P P low vol ivf,  Follow WBC, eos, allopurinol, give Fe   LOS: 1 day   [name] 01/04/2013,9:10 AM

## 2013-01-04 NOTE — Progress Notes (Signed)
BP 89/47. Pt sitting on the edge of the bed, asymptomatic. MD notified. 284ml NS bolus ordered. Will monitor.

## 2013-01-05 DIAGNOSIS — J9620 Acute and chronic respiratory failure, unspecified whether with hypoxia or hypercapnia: Secondary | ICD-10-CM

## 2013-01-05 LAB — DIFFERENTIAL
Basophils Absolute: 0.1 10*3/uL (ref 0.0–0.1)
Basophils Relative: 1 % (ref 0–1)
Eosinophils Relative: 16 % — ABNORMAL HIGH (ref 0–5)
Lymphocytes Relative: 15 % (ref 12–46)
Lymphs Abs: 0.7 10*3/uL (ref 0.7–4.0)
Monocytes Absolute: 0.5 10*3/uL (ref 0.1–1.0)
Monocytes Relative: 10 % (ref 3–12)
Monocytes Relative: 9 % (ref 3–12)
Neutro Abs: 2.5 10*3/uL (ref 1.7–7.7)
Neutrophils Relative %: 60 % (ref 43–77)

## 2013-01-05 LAB — CBC
HCT: 26.5 % — ABNORMAL LOW (ref 39.0–52.0)
HCT: 25.8 % — ABNORMAL LOW (ref 39.0–52.0)
Hemoglobin: 8.2 g/dL — ABNORMAL LOW (ref 13.0–17.0)
Hemoglobin: 7.9 g/dL — ABNORMAL LOW (ref 13.0–17.0)
MCH: 27.4 pg (ref 26.0–34.0)
MCHC: 30.9 g/dL (ref 30.0–36.0)
MCV: 88.6 fL (ref 78.0–100.0)
MCV: 89.6 fL (ref 78.0–100.0)
RBC: 2.88 MIL/uL — ABNORMAL LOW (ref 4.22–5.81)
RDW: 20.2 % — ABNORMAL HIGH (ref 11.5–15.5)
WBC: 4.6 10*3/uL (ref 4.0–10.5)

## 2013-01-05 LAB — IRON AND TIBC
Iron: 55 ug/dL (ref 42–135)
TIBC: 233 ug/dL (ref 215–435)
TIBC: 226 ug/dL (ref 215–435)

## 2013-01-05 LAB — RENAL FUNCTION PANEL
BUN: 64 mg/dL — ABNORMAL HIGH (ref 6–23)
BUN: 56 mg/dL — ABNORMAL HIGH (ref 6–23)
CO2: 24 mEq/L (ref 19–32)
CO2: 24 mEq/L (ref 19–32)
Calcium: 8.9 mg/dL (ref 8.4–10.5)
Calcium: 8.9 mg/dL (ref 8.4–10.5)
Creatinine, Ser: 1.85 mg/dL — ABNORMAL HIGH (ref 0.50–1.35)
GFR calc Af Amer: 47 mL/min — ABNORMAL LOW (ref >90)
Glucose, Bld: 99 mg/dL (ref 70–99)
Glucose, Bld: 136 mg/dL — ABNORMAL HIGH (ref 70–99)
Phosphorus: 5 mg/dL — ABNORMAL HIGH (ref 2.3–4.6)
Phosphorus: 4.1 mg/dL (ref 2.3–4.6)
Potassium: 3.9 mEq/L (ref 3.5–5.1)
Sodium: 138 mEq/L (ref 135–145)

## 2013-01-05 LAB — FERRITIN: Ferritin: 559 ng/mL — ABNORMAL HIGH (ref 22–322)

## 2013-01-05 LAB — VITAMIN B12: Vitamin B-12: 685 pg/mL (ref 211–911)

## 2013-01-05 LAB — URINE CULTURE: Special Requests: NORMAL

## 2013-01-05 LAB — TRANSFERRIN: Transferrin: 162 mg/dL — ABNORMAL LOW (ref 200–360)

## 2013-01-05 LAB — HEPATITIS C ANTIBODY (REFLEX): HCV Ab: NEGATIVE

## 2013-01-05 LAB — TSH: TSH: 1.929 u[IU]/mL (ref 0.350–4.500)

## 2013-01-05 MED ORDER — FUROSEMIDE 80 MG PO TABS
80.0000 mg | ORAL_TABLET | Freq: Two times a day (BID) | ORAL | Status: DC
  Filled 2013-01-05 (×3): qty 1

## 2013-01-05 MED ORDER — FUROSEMIDE 80 MG PO TABS: 160.0000 mg | ORAL_TABLET | Freq: Two times a day (BID) | ORAL | Status: DC

## 2013-01-05 MED ORDER — FUROSEMIDE 80 MG PO TABS
160.0000 mg | ORAL_TABLET | Freq: Two times a day (BID) | ORAL | Status: DC
  Administered 2013-01-05: 160 mg via ORAL
  Filled 2013-01-05 (×3): qty 2

## 2013-01-05 MED ORDER — ALLOPURINOL 100 MG PO TABS: 200.0000 mg | ORAL_TABLET | Freq: Every day | ORAL | Status: DC

## 2013-01-05 NOTE — Progress Notes (Signed)
Received call from [facility] caring for patient on 6700 concerning [place] discharge orders. Pt lives alone in [place], [place] but will be staying with Daughter ([place], [place]) has [employer]. Called and Spoke with patient, concerning home PT/OT recommendation. Offered choice of [place] agencies. Pt and stated, he has been active in the past with [place] [facility], and desired to use [facility] again.  Referral called and spoke with Ailene Ravel at [phone] faxed to [phone] received fax transmission copy for PT/OT. Called and confirmed the plan of care with patient . Pt has walker and 3 in 1 potty chair at home. RN Gwenlyn Perking notified of [facility] set up. Explained that [facility] will contact patient at number provided [phone] with 24-48 hours. Pt and verbalized understanding.  Wendi Maya RN BSN

## 2013-01-05 NOTE — Progress Notes (Signed)
Subjective: Interval History: has no complaint, feels better.  Objective: Vital signs in last 24 hours: Temp:  [97.6 F (36.4 C)-98.6 F (37 C)] 97.6 F (36.4 C) (07/06 0554) Pulse Rate:  [88-98] 98 (07/06 0554) Resp:  [18-20] 18 (07/06 0554) BP: (105-120)/(62-77) 120/70 mmHg (07/06 0554) SpO2:  [96 %-99 %] 99 % (07/06 0844) Weight:  [94.303 kg (207 lb 14.4 oz)] 94.303 kg (207 lb 14.4 oz) (07/05 2047) Weight change: -0.197 kg (-7 oz)  Intake/Output from previous day: 07/05 0701 - 07/06 0700 In: 875 [I.V.:875] Out: 275 [Urine:275] Intake/Output this shift:    General appearance: alert, cooperative, mild distress, moderately obese and mod dyspnea Resp: diminished breath sounds bilaterally and rales bibasilar Cardio: S1, S2 normal and systolic murmur: holosystolic 2/6, blowing at apex GI: obese,liver down 6 cm Extremities: edema 3+  Lab Results:  Recent Labs  01/04/13 0415 01/05/13 0454  WBC 6.0 4.5  HGB 8.0* 8.2*  HCT 25.4* 26.5*  PLT 218 210   BMET:  Recent Labs  01/04/13 0415 01/05/13 0454  NA 136 137  K 4.8 4.2  CL 101 106  CO2 23 24  GLUCOSE 91 99  BUN 89* 64*  CREATININE 3.62* 1.85*  CALCIUM 8.6 8.9   No results found for this basename: PTH,  in the last 72 hours Iron Studies:  Recent Labs  01/03/13 1301  IRON 55  TIBC 233    Studies/Results: US Renal  01/03/2013   *RADIOLOGY REPORT*  Clinical Data:  Acute kidney injury  RENAL/URINARY TRACT ULTRASOUND COMPLETE  Comparison:  None.  Findings:  Right Kidney:  Measures 11.3 cm.  Diffusely echogenic. No evidence of mass or hydronephrosis. Cyst is identified within the upper pole measuring 1.3 cm.  Left Kidney:  Appears echogenic measuring 11.3 cm in length. No evidence of mass or hydronephrosis. Upper pole cyst measures 2 cm.  Bladder:  Appears normal for degree of bladder distention.  Abdominal aortic aneurysm is again noted.  IMPRESSION:  1.  Bilateral echogenic kidneys compatible with medical renal  disease. 2.  No hydronephrosis. 3.  Abdominal aortic aneurysm.   Original Report Authenticated By: Kerby Moors, M.D.   Portable Chest 1 View  01/04/2013   *RADIOLOGY REPORT*  Clinical Data: History of coronary artery disease and CHF  PORTABLE CHEST - 1 VIEW  Comparison: 12/02/2012  Findings: The patient is status post median sternotomy and CABG procedure.  There is mild cardiac enlargement.  No pleural effusion identified.  Pulmonary vascular congestion appears similar to previous exam.  IMPRESSION:  1.  Cardiac enlargement and pulmonary vascular congestion.   Original Report Authenticated By: Kerby Moors, M.D.    I have reviewed the patient's current medications.  Assessment/Plan: 1 AKI resolved, vol xs. Need to restart diuetics.   AIN vs toxic ATN contrast either way . With Cr at baseline, will avoid steroids 2 DM controlled 3 Obesity 4 AVR/CAD per Cards 5 COPD/OSA 6 AAA per [facility] P lasix,d/c ivf, FE    LOS: 2 days   Norine Reddington L 01/05/2013,9:07 AM

## 2013-01-05 NOTE — Discharge Summary (Signed)
Physician Discharge Summary  Patient ID: [name] MRN: [id number] DOB/AGE: 02/21/1941 72 y.o.  Admit date: 01/03/2013 Discharge date: 01/05/2013  Primary Care Physician:  [name], MD  Discharge Diagnoses:   . AKI (acute kidney injury)- improving, close to baseline  . AAA (abdominal aortic aneurysm) without rupture . Aneurysm of right iliac artery . Postinflammatory pulmonary fibrosis . COPD (chronic obstructive pulmonary disease) . CAD (coronary artery disease) . Hypotension . Type 2 diabetes mellitus  Consults:  1. Renal sevice- Dr Deterding   Recommendations for Outpatient Follow-up:  1. Coreg, Hydralazine, Imdur, Torsemide are currently on HOLD due to low BP. Patient was started on Lasix prior to discharge. 2. Please check BMET on the appointment for the renal function. His antihypertensives and cardiac medications will need to be judicially restarted, defer to cardiology.   Allergies:   Allergies  Allergen Reactions  . Cephalosporins Rash    Blisters     Discharge Medications:   Medication List    STOP taking these medications       carvedilol 3.125 MG tablet  Commonly known as:  COREG     hydrALAZINE 10 MG tablet  Commonly known as:  APRESOLINE     ibuprofen 200 MG tablet  Commonly known as:  ADVIL,MOTRIN     isosorbide mononitrate 30 MG 24 hr tablet  Commonly known as:  IMDUR     torsemide 20 MG tablet  Commonly known as:  DEMADEX      TAKE these medications       allopurinol 100 MG tablet  Commonly known as:  ZYLOPRIM  Take 2 tablets (200 mg total) by mouth daily.     aspirin 325 MG EC tablet  Take 1 tablet (325 mg total) by mouth daily.     atorvastatin 80 MG tablet  Commonly known as:  LIPITOR  Take 1 tablet (80 mg total) by mouth daily.     clonazePAM 0.5 MG tablet  Commonly known as:  KLONOPIN  Take 1 tablet (0.5 mg total) by mouth 2 (two) times daily as needed (anxiety).     furosemide 80 MG tablet  Commonly known as:   LASIX  Take 2 tablets (160 mg total) by mouth 2 (two) times daily.     hydrocortisone cream 1 %  Apply 1 application topically daily.     ipratropium-albuterol 0.5-2.5 (3) MG/3ML Soln  Commonly known as:  DUONEB  Take 3 mLs by nebulization every 6 (six) hours.     KLOR-CON 10 10 MEQ tablet  Generic drug:  potassium chloride  Take 10 mEq by mouth 2 (two) times daily.     nitroGLYCERIN 0.4 MG SL tablet  Commonly known as:  NITROSTAT  Place 1 tablet (0.4 mg total) under the tongue every 5 (five) minutes as needed for chest pain.     pantoprazole 40 MG tablet  Commonly known as:  PROTONIX  Take 1 tablet by mouth daily.     sitaGLIPtin 100 MG tablet  Commonly known as:  JANUVIA  Take 100 mg by mouth daily.         Brief H and P: For complete details please refer to admission H and P, but in brief the patient is a 72 y.o. 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   He was rehospitalized in late June for shortness of breath and acute on chronic CHF. He was diuresed and his creatinine increased from 1.1-1.5 at which time the diuresis with discontinued and he was discharged home. Repeat creatinine on 6/27 was 2.2 so his diuretics were held for one day and then restarted. Due to intermittent headaches that he has been taking ibuprofen 600 mg approximately once every other day. He also has chronically low blood pressures ranging from the [id number] to 0000000 systolic.  On July 1st, he underwent CT angiogram of the abdomen and pelvis as a preoperative exam for stent placement. On July 3  routine labs demonstrated Na 129, potassium 6.6, CO2 of 22, BUN 89, creatinine 4.17. The patient was advised to come immediately to the emergency department.   Hospital Course:  Acute on chronic kidney disease. Baseline creatinine of 1.1-1.2 with stage III CKD. Given the patient's comorbidities he is likely not a good candidate for HD. AKI was felt to be a combination of ATN secondary to diuretics, contrast induced nephropathy, and NSAID use. Patient's cardiac medications were held due to hypotension and AKI. Creatinine has improved to 1.6 today from 4.17 at the time of admission. He was provided with gentle hydration. Patient was restarted on Lasix today by renal service. Urinalysis with numerous WBC's but few bacteria, likely he may have urine eosinophilia from allergy to contrast. He should have repeat renal panel checked on 01/08/2013 appointment with cardiology and defer the decision of restarting his cardiac medications to his primary cardiologist, Dr. [name].  CAD: Status post CABG for 3VD.  - Continue aspirin and statin. Hold beta blocker, hydralazine and Imdur due to hypotension. Patient is restarted on Lasix today. Patient has an appointment with his cardiologist on 01/08/13, he will need to be restarted/adjusted on his medications as the BP and renal function allows.  Chronic systolic CHF: EF 71 prior to surgery, appeared to improve post-op with last echo showing EF around 50%. NYHA class IIIb symptoms at this point. He has gained 10 lbs. Avoiding beta blocker, hydralazine, ARB/ACE inhibitor d/t renal failure and hypotension. Patient is started on Lasix 160 mg twice a day prior to DC today.   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  - Continue Duo nebs  OSA, chronic, stable. Continue CPAP at night.   Diabetes mellitus: Continue Januvia at home.  Carotid stenosis, chronic, stable: Repeat carotid dopplers in 10/14.  Hyperlipidemia, Chronic, stable.  Continue statin  AAA with extension to the right common iliac artery and associated mural thrombus without occlusion: 6.9 x 7 cm. See report from 7/1. Per cardiology he is not a candidate for stent grafting, follows outpatient Dr. Kellie Simmering  Status post bioprosthetic AVR in 4/14.   Hyponatremia, hyperkalemia, borderline metabolic acidosis are all likely due to his acute kidney injury: Resolved    Normocytic anemia, chronic, stable. Likely due to CKD    Day of Discharge BP 99/61  Pulse 70  Temp(Src) 98.2 F (36.8 C) (Oral)  Resp 18  Ht 5\' 6"  (1.676 m)  Wt 94.303 kg (207 lb 14.4 oz)  BMI 33.57 kg/m2  SpO2 98%  Physical Exam: General: Alert and awake oriented x3 not in any acute distress. HEENT: anicteric sclera, pupils reactive to light and accommodation CVS: S1-S2 clear no murmur rubs or gallops Chest: clear to auscultation bilaterally, no wheezing rales or rhonchi Abdomen: soft nontender,  nondistended, normal bowel sounds, no organomegaly Extremities: no cyanosis, clubbing or edema noted bilaterally Neuro: Cranial nerves II-XII intact, no focal neurological deficits   The results of significant diagnostics from this hospitalization (including imaging, microbiology, ancillary and laboratory) are listed below for reference.    LAB RESULTS: Basic Metabolic Panel:  Recent Labs Lab 01/04/13 0415 01/05/13 0454  NA 136 137  K 4.8 4.2  CL 101 106  CO2 23 24  GLUCOSE 91 99  BUN 89* 64*  CREATININE 3.62* 1.85*  CALCIUM 8.6 8.9  PHOS 6.9* 5.0*   Liver Function Tests:  Recent Labs Lab 01/04/13 0415 01/05/13 0454  AST 16  --   ALT 15  --   ALKPHOS 82  --   BILITOT 0.4  --   PROT 6.5  --   ALBUMIN 2.9* 2.8*   No results found for this basename: LIPASE, AMYLASE,  in the last 168 hours No results found for this basename: AMMONIA,  in the last 168 hours CBC:  Recent Labs Lab 01/04/13 0415 01/05/13 0454  WBC 6.0 4.5  NEUTROABS  --  2.5  HGB 8.0* 8.2*  HCT 25.4*  26.5*  MCV 88.5 88.6  PLT 218 210   Cardiac Enzymes: No results found for this basename: CKTOTAL, CKMB, CKMBINDEX, TROPONINI,  in the last 168 hours BNP: No components found with this basename: POCBNP,  CBG:  Recent Labs Lab 01/04/13 1702 01/05/13 0743  GLUCAP 121* 87    Significant Diagnostic Studies:  US Renal  01/03/2013   *RADIOLOGY REPORT*  Clinical Data:  Acute kidney injury  RENAL/URINARY TRACT ULTRASOUND COMPLETE  Comparison:  None.  Findings:  Right Kidney:  Measures 11.3 cm.  Diffusely echogenic. No evidence of mass or hydronephrosis. Cyst is identified within the upper pole measuring 1.3 cm.  Left Kidney:  Appears echogenic measuring 11.3 cm in length. No evidence of mass or hydronephrosis. Upper pole cyst measures 2 cm.  Bladder:  Appears normal for degree of bladder distention.  Abdominal aortic aneurysm is again noted.  IMPRESSION:  1.  Bilateral echogenic kidneys compatible with medical renal disease. 2.  No hydronephrosis. 3.  Abdominal aortic aneurysm.   Original Report Authenticated By: Kerby Moors, M.D.   Portable Chest 1 View  01/04/2013   *RADIOLOGY REPORT*  Clinical Data: History of coronary artery disease and CHF  PORTABLE CHEST - 1 VIEW  Comparison: 12/02/2012  Findings: The patient is status post median sternotomy and CABG procedure.  There is mild cardiac enlargement.  No pleural effusion identified.  Pulmonary vascular congestion appears similar to previous exam.  IMPRESSION:  1.  Cardiac enlargement and pulmonary vascular congestion.   Original Report Authenticated By: Kerby Moors, M.D.    Disposition and Follow-up:     Discharge Orders   Future Appointments Provider Department Dept Phone   01/08/2013 3:00 PM [facility] [phone]   01/21/2013 1:00 PM [facility] [place] [facility] [phone]   01/21/2013 2:30 PM Melvenia Needles, NP [place] [phone]   01/23/2013  10:20 AM [facility] [phone]   02/03/2013 10:30 AM [facility] [place] 3 ECHO LAB [phone]   03/04/2013 1:30 PM [facility] [phone]   03/04/2013 2:00 PM [facility] [phone]   03/04/2013 3:00 PM Mal Misty, MD [facility] [place]  [phone]   Future Orders Complete By Expires     (HEART FAILURE PATIENTS) Call MD:  Anytime you have any of the following symptoms: 1) 3 pound weight gain in 24 hours or 5 pounds in 1 week 2) shortness of breath, with or without a dry hacking cough 3) swelling in the hands, feet or stomach 4) if you have to sleep on extra pillows at night in order to breathe.  As directed     Diet Carb Modified  As directed     Discharge instructions  As directed     Comments:      Please note that Coreg, Hydralazine, Imdur, Torsemide are on HOLD due to your low BP. Dr [name] (or his partner) will decide on 01/08/13 appt which medications to restart. Also check BMET (renal function) lab on the appointment.    Increase activity slowly  As directed         DISPOSITION: Home DIET: Carb modified diet ACTIVITY: As tolerated  TESTS THAT NEED FOLLOW-UP bmet   DISCHARGE FOLLOW-UP Follow-up Information   Follow up with Loralie Champagne, MD On 01/08/2013. (at 3:00PM)    Contact information:   [address] [place] [address] [phone]       Follow up with [name], MD. Schedule an appointment as soon as possible for a visit in 10 days.   Contact information:   [address] [place] [id number] [phone]       Follow up with DETERDING,JAMES L, MD. Schedule an appointment as soon as possible for a visit in 1 week. (office will arrange follow-up, please call them to confirm)    Contact information:   [place] [address] [phone]       Time spent on Discharge: 45 mins  Signed:   Tanea Moga M.D. [facility] 01/05/2013, 10:21 AM Pager: [phone]

## 2013-01-05 NOTE — Progress Notes (Deleted)
Received call from [facility] caring for patient on 3W concerning [facility] discharge orders. Pt lives with husband in [place] ( [place] ) has [employer]. Called and Spoke with patient and daughter Janean Sark, concerning home PT recommendation. Offered choice of [place] agencies Pt and daughter stated, Pt had been active with  [facility] services in the past with [facility] and desired to use [facility] again.  Referral called and faxed to [facility] for PT/ w/c at [phone] .Also called and spoke with Germaine at [facility] to deliver DME w/c to room. Called  and confirmed the plan of care with patient and family. RN Charlett Nose notified of [facility] set up. Explained that [facility] will contact patient and family at number provided [phone] or [phone] with 24-48 hours. Pt and verbalized understanding.  Wendi Maya RN BSN

## 2013-01-06 LAB — FOLATE RBC: RBC Folate: 2484 ng/mL — ABNORMAL HIGH (ref >=366)

## 2013-01-07 ENCOUNTER — Telehealth: Payer: Self-pay | Admitting: *Deleted

## 2013-01-07 LAB — PARATHYROID HORMONE, INTACT (NO CA): PTH: 113.6 pg/mL — ABNORMAL HIGH (ref 14.0–72.0)

## 2013-01-07 NOTE — Telephone Encounter (Signed)
Sherri from cardiac rehab called. She did a 7 minute walk test with this patient and he failed the test so she could not allow him to join the program. She gave the patient a copy to show to the PA at the heart failure clinic tomorrow. She will also fax to [place] for his review.

## 2013-01-08 ENCOUNTER — Encounter (HOSPITAL_COMMUNITY): Payer: Self-pay

## 2013-01-08 ENCOUNTER — Ambulatory Visit (HOSPITAL_COMMUNITY)
Admission: RE | Admit: 2013-01-08 | Discharge: 2013-01-08 | Disposition: A | Discharge status: Home / Self Care | Payer: Medicare Other | Source: Ambulatory Visit | Attending: Internal Medicine | Admitting: Internal Medicine

## 2013-01-08 VITALS — BP 96/70 | HR 102 | Wt 209.1 lb

## 2013-01-08 DIAGNOSIS — I5032 Chronic diastolic (congestive) heart failure: Secondary | ICD-10-CM | POA: Diagnosis not present

## 2013-01-08 DIAGNOSIS — I2510 Atherosclerotic heart disease of native coronary artery without angina pectoris: Secondary | ICD-10-CM

## 2013-01-08 DIAGNOSIS — N289 Disorder of kidney and ureter, unspecified: Secondary | ICD-10-CM | POA: Diagnosis not present

## 2013-01-08 DIAGNOSIS — Z951 Presence of aortocoronary bypass graft: Secondary | ICD-10-CM

## 2013-01-08 LAB — PROTEIN ELECTROPHORESIS, SERUM
Albumin ELP: 47.4 % — ABNORMAL LOW (ref 55.8–66.1)
Alpha-2-Globulin: 11 % (ref 7.1–11.8)
Beta Globulin: 5.5 % (ref 4.7–7.2)
Total Protein ELP: 6.4 g/dL (ref 6.0–8.3)

## 2013-01-08 LAB — UIFE/LIGHT CHAINS/TP QN, 24-HR UR
Alpha 1, Urine: DETECTED — AB
Free Kappa Lt Chains,Ur: 7.82 mg/dL — ABNORMAL HIGH (ref 0.14–2.42)
Free Kappa/Lambda Ratio: 5.36 ratio (ref 2.04–10.37)
Free Lambda Lt Chains,Ur: 1.46 mg/dL — ABNORMAL HIGH (ref 0.02–0.67)
Gamma Globulin, Urine: DETECTED — AB
Total Protein, Urine: 10.5 mg/dL

## 2013-01-08 MED ORDER — BISOPROLOL FUMARATE 5 MG PO TABS: 2.5000 mg | ORAL_TABLET | Freq: Every day | ORAL | Status: DC

## 2013-01-08 NOTE — Progress Notes (Signed)
Patient ID: [name], male   DOB: 1941-01-12, 72 y.o.   MRN: [id number]  Weight Range   Baseline proBNP     HPI: 72 yo with history of ARDS/respiratory failure and tracheostomy in 1/14 as well as DM, HTN, and COPD presented to the ER at [place] with CHF in 4/14. Patient had a prolonged hospitalization in 1/14 with H1N1 influenza and pneumococcal PNA. This progressed to ARDS. He was intubated and ended up with tracheostomy. He has had the trach removed. He also had a left empyema requiring chest tube, septic shock with elevated troponin, and AKI which resolved.   He was admitted again in 4/14 from his nursing home with exertional dyspnea and orthopnea. He had gained 21 lbs. Echo at admission showed EF 35% with global hypokinesis that looked worse in the anteroseptal wall. He also was noted to have aortic stenosis rated as moderate to severe. He was diuresed and cathed, showing severe 3 vessel disease. He then had CABG-AVR (bioprosthetic valve). He did well post-op considering his comorbidities and is now home with his wife. Echo in 5/14 prior to discharge showed EF up to 50% with well-seated aortic valve.   At last appointment, patient was noted to be volume overloaded with 17 lb weight gain. He was admitted and diuresed, losing > 10 lbs. He had a pulmonary evaluation given persistent hypoxemia, which was thought to be due to COPD and post-infectious pulmonary fibrosis. Most recently, creatinine increased to 2.2 and torsemide was cut back. He has gained back 10 lbs since leaving the hospital. He is more short of breath and is using his home oxygen.   Of note, patient had recent vascular surgery followup of his AAA. He had a CT abdomen to follow his AAA on earlier this week, showing 6.9 x 6.8 cm aneurysm. Dr. Kellie Simmering told him that he was not yet physically ready for surgery, will followup in the future. Unfortunately, the CT was done with contrast. Not a candidate for stenting. After getting the  contrast, patient developed diffuse leg redness.   Admitted 7/4 through 01/05/13 wit hyperkalemia 6.5 and elevated creatinine 4.7 thought to be from contrast. Coreg, hydralazine, IMDUR, Torsemide were stopped. He was started on 160 mg of lasix twice a day. Discharge weight was 207 pounds.   He returns for post hospital follow up. Overall he feels ok. His wife is anxious to restart all his medications. Complains of mild dyspnea with exertion. Denies PND/Orthopnea. Weight at home 206-210 pounds. Yesterday he returned to cardiac rehab at  [facility]. His pulse went up from 90 to 124 bpm and he was asked to stop. Compliant with medications.       ROS: All systems negative except as listed in HPI, PMH and Problem List.  Past Medical History  Diagnosis Date  . Hypertension   . Hyperlipidemia   . OSA (obstructive sleep apnea)     on cpap  . Prediabetes   . CHF (congestive heart failure) 07/2012; 10/17/2012  . COPD (chronic obstructive pulmonary disease)     a.  History of heavy smoking. PFTs (4/14) with mixed obstructive (COPD) and restrictive (post-ARDS) picture.   . Pneumonia 07/2012    PNA (H1N1 influenza + pneumococcus) in [id number] complicated by respiratory failure/ARDS. Required tracheostomy, now weaned off. Had left empyema requiring chest tube.   . Diabetes mellitus without complication   . History of blood transfusion     "lots since January" (10/17/2012)  . Anxiety   . Ischemic cardiomyopathy   .  Anemia of chronic disease   . Prostate cancer      s/p radiation treatment. Has indwelling foley.   . Aortic stenosis     Moderate to severe by echo in 4/14. Bioprosthetic #21 [facility] Ease aortic valve replacement in 4/14.   Marland Kitchen CAD (coronary artery disease)     Moderate to severe by echo in 4/14. Bioprosthetic #21 [facility] Ease aortic valve replacement in 4/14.   . Carotid arterial disease     Carotid dopplers (0000000) with [id number] LICA stenosis.   Marland Kitchen AAA (abdominal aortic  aneurysm)     5/14 CT showed > 6 cm AAA, also right iliac aneurysm. Not stent graft candidate.   . Esophageal reflux     Current Outpatient Prescriptions  Medication Sig Dispense Refill  . allopurinol (ZYLOPRIM) 100 MG tablet Take 2 tablets (200 mg total) by mouth daily.  60 tablet  2  . aspirin EC 325 MG EC tablet Take 1 tablet (325 mg total) by mouth daily.  30 tablet    . atorvastatin (LIPITOR) 80 MG tablet Take 1 tablet (80 mg total) by mouth daily.  30 tablet  6  . clonazePAM (KLONOPIN) 0.5 MG tablet Take 1 tablet (0.5 mg total) by mouth 2 (two) times daily as needed (anxiety).  60 tablet  3  . furosemide (LASIX) 80 MG tablet Take 2 tablets (160 mg total) by mouth 2 (two) times daily.  120 tablet  0  . guaifenesin (ROBITUSSIN) 100 MG/5ML syrup Take 300 mg by mouth 4 (four) times daily as needed for cough.      . hydrocortisone cream 1 % Apply 1 application topically daily.      Marland Kitchen ipratropium-albuterol (DUONEB) 0.5-2.5 (3) MG/3ML SOLN Take 3 mLs by nebulization every 6 (six) hours.      . nitroGLYCERIN (NITROSTAT) 0.4 MG SL tablet Place 1 tablet (0.4 mg total) under the tongue every 5 (five) minutes as needed for chest pain.  25 tablet  12  . pantoprazole (PROTONIX) 40 MG tablet Take 1 tablet by mouth daily.      . potassium chloride (KLOR-CON 10) 10 MEQ tablet Take 10 mEq by mouth 2 (two) times daily.       . sitaGLIPtin (JANUVIA) 100 MG tablet Take 100 mg by mouth daily.      . bisoprolol (ZEBETA) 5 MG tablet Take 0.5 tablets (2.5 mg total) by mouth at bedtime.  15 tablet  3   No current facility-administered medications for this encounter.     PHYSICAL EXAM: Filed Vitals:   01/08/13 1603  BP: 96/70  Pulse: 102  Weight: 209 lb 1.9 oz (94.856 kg)  SpO2: 96%    General:  Chronically ill appearing. No resp difficulty.  Wife present  HEENT: normal Neck: supple. JVP ~8-9. Carotids 2+ bilaterally; no bruits. No lymphadenopathy or thryomegaly appreciated. Cor: PMI normal. Regular  rate & rhythm. No rubs, gallops or murmurs. Lungs: clear oxygen 2 liters [place] Abdomen: soft, nontender, nondistended. No hepatosplenomegaly. No bruits or masses. Good bowel sounds. Extremities: no cyanosis, clubbing, rash, R and LLE 1+ edema Neuro: alert & orientedx3, cranial nerves grossly intact. Moves all 4 extremities w/o difficulty. Affect pleasant.   EKG: Sinus Tach 102 BPM     ASSESSMENT & PLAN:

## 2013-01-08 NOTE — Patient Instructions (Addendum)
Take Bisoprolol 2.5 mg at bed time which is 1/2 tablet  Follow up July 21  3:40 with Dr [name]  Do the following things EVERYDAY: 1) Weigh yourself in the morning before breakfast. Write it down and keep it in a log. 2) Take your medicines as prescribed 3) Eat low salt foods-Limit salt (sodium) to 2000 mg per day.  4) Stay as active as you can everyday 5) Limit all fluids for the day to less than 2 liters

## 2013-01-09 ENCOUNTER — Telehealth (HOSPITAL_COMMUNITY): Payer: Self-pay | Admitting: Adult Health

## 2013-01-09 DIAGNOSIS — I5032 Chronic diastolic (congestive) heart failure: Secondary | ICD-10-CM | POA: Insufficient documentation

## 2013-01-09 NOTE — Assessment & Plan Note (Signed)
Will restart bisoprolol 2.5 mg at bed time.

## 2013-01-09 NOTE — Assessment & Plan Note (Signed)
Volume status mildly elevated. Continue current diuretic regimen. Check BMET at [facility] tomorrow and fax results to HF clinic. Reinforced daily weights, low salt food choices, and limiting fluid intake to < 2 liters per day. Follow up in 2 weeks with Dr [name]

## 2013-01-09 NOTE — Telephone Encounter (Signed)
Left message. Lab work ok from today 01/09/13.  Asked to return call if he has any questions.   CLEGG,AMY 5:08 PM

## 2013-01-09 NOTE — Assessment & Plan Note (Addendum)
Restart low cardioselective beta blocker. Start bisoprolol 2.5 mg at bed time. No ace due to recent hyperkalemia and elevated creatinine. On statin.

## 2013-01-13 ENCOUNTER — Telehealth: Payer: Self-pay | Admitting: *Deleted

## 2013-01-13 NOTE — Telephone Encounter (Signed)
FW: [name]    Lachlann Inselman     MRN: [id number] DOB: [birth year]     Pt Home: [phone]     Sent: Mon January 06, 2013 2:54 PM     To: Katrine Coho, RN                          Message    appt with Jerrye Beavers PA 01/23/13 @ 3:30. Per Dr. Jimmy Footman   ----- Message -----   From: Karlene Einstein   Sent: 01/06/2013 10:28 AM   To: Bethel Born [facility]ate   Subject: [name]             ----- Message -----   From: Katrine Coho, RN   Sent: 01/02/2013 4:56 PM   To: Laurin Coder      Needs referral to nephrologist--order is in Epic 01/02/13

## 2013-01-16 ENCOUNTER — Encounter: Payer: Self-pay | Admitting: Internal Medicine

## 2013-01-20 ENCOUNTER — Ambulatory Visit (HOSPITAL_COMMUNITY)
Admission: RE | Admit: 2013-01-20 | Discharge: 2013-01-20 | Disposition: A | Discharge status: Home / Self Care | Payer: Medicare Other | Source: Ambulatory Visit | Attending: Cardiology | Admitting: Cardiology

## 2013-01-20 ENCOUNTER — Inpatient Hospital Stay: Payer: BC Managed Care – PPO | Admitting: Adult Health

## 2013-01-20 VITALS — BP 90/60 | HR 101 | Wt 213.0 lb

## 2013-01-20 DIAGNOSIS — I714 Abdominal aortic aneurysm, without rupture: Secondary | ICD-10-CM

## 2013-01-20 DIAGNOSIS — Z9981 Dependence on supplemental oxygen: Secondary | ICD-10-CM | POA: Insufficient documentation

## 2013-01-20 DIAGNOSIS — J449 Chronic obstructive pulmonary disease, unspecified: Secondary | ICD-10-CM | POA: Insufficient documentation

## 2013-01-20 DIAGNOSIS — I2510 Atherosclerotic heart disease of native coronary artery without angina pectoris: Secondary | ICD-10-CM | POA: Insufficient documentation

## 2013-01-20 DIAGNOSIS — J4489 Other specified chronic obstructive pulmonary disease: Secondary | ICD-10-CM | POA: Insufficient documentation

## 2013-01-20 DIAGNOSIS — Z951 Presence of aortocoronary bypass graft: Secondary | ICD-10-CM | POA: Insufficient documentation

## 2013-01-20 DIAGNOSIS — Z953 Presence of xenogenic heart valve: Secondary | ICD-10-CM

## 2013-01-20 DIAGNOSIS — Z952 Presence of prosthetic heart valve: Secondary | ICD-10-CM | POA: Insufficient documentation

## 2013-01-20 DIAGNOSIS — I6529 Occlusion and stenosis of unspecified carotid artery: Secondary | ICD-10-CM | POA: Insufficient documentation

## 2013-01-20 DIAGNOSIS — I509 Heart failure, unspecified: Secondary | ICD-10-CM | POA: Insufficient documentation

## 2013-01-20 DIAGNOSIS — E785 Hyperlipidemia, unspecified: Secondary | ICD-10-CM | POA: Insufficient documentation

## 2013-01-20 DIAGNOSIS — I5032 Chronic diastolic (congestive) heart failure: Secondary | ICD-10-CM

## 2013-01-20 DIAGNOSIS — I5022 Chronic systolic (congestive) heart failure: Secondary | ICD-10-CM | POA: Insufficient documentation

## 2013-01-20 DIAGNOSIS — J8410 Pulmonary fibrosis, unspecified: Secondary | ICD-10-CM

## 2013-01-20 NOTE — Progress Notes (Signed)
Patient ID: MKAI FERRYMAN, male   DOB: 22-Apr-1941, 72 y.o.   MRN: [id number]   Weight Range  207 pounds.   Baseline proBNP     HPI: 72 yo with history of ARDS/respiratory failure and tracheostomy in 1/14 as well as DM, HTN, and COPD presented to the ER at [facility] with CHF in 4/14. Patient had a prolonged hospitalization in 1/14 with H1N1 influenza and pneumococcal PNA. This progressed to ARDS. He was intubated and ended up with tracheostomy. He has had the trach removed. He also had a left empyema requiring chest tube, septic shock with elevated troponin, and AKI which resolved.   He was admitted again in 4/14 from his nursing home with exertional dyspnea and orthopnea. He had gained 21 lbs. Echo at admission showed EF 35% with global hypokinesis that looked worse in the anteroseptal wall. He also was noted to have aortic stenosis rated as moderate to severe. He was diuresed  and cathed, showing severe 3 vessel disease. He then had CABG-AVR (bioprosthetic valve). He did well post-op considering his comorbidities and is now home with his wife. Echo in 5/14 prior to discharge showed EF up to 50% with well-seated aortic valve.   At last appointment, patient was noted to be volume overloaded with 17 lb weight gain. He was admitted and diuresed, losing > 10 lbs. He had a pulmonary evaluation given persistent hypoxemia, which was thought to be due to COPD and post-infectious pulmonary fibrosis. Most recently, creatinine increased to 2.2 and torsemide was cut back. He has gained back 10 lbs since leaving the hospital. He is more short of breath and is using his home oxygen.   Of note, patient had recent vascular surgery followup of his AAA. He had a CT abdomen to follow his AAA on earlier this week, showing 6.9 x 6.8 cm aneurysm. Dr. Kellie Simmering told him that he was not yet physically ready for surgery, will followup in the future. Unfortunately, the CT was done with contrast. Not a candidate for stenting. After  getting the contrast, patient developed diffuse leg redness.   Admitted 7/4 through 01/05/13 with hyperkalemia 6.5 and elevated creatinine 4.7 thought to be from contrast given with CTA abdomen/pelvis. Coreg, hydralazine, IMDUR, Torsemide were stopped. He was started on 160 mg of lasix twice a day. Discharge weight was 207 pounds.   He returns for follow up. Last visit he was started on 2.5 mg of bisoprolol daily. He is not on an ace-i due to hyperkalemia and elevated creatinine. Last week he was evaluated by Urology and started on Cipro.  Overall he feels ok. Says his breathing is better but still uses home oxygen and is short of breath walking outside. Complains of mild orthostatic-type dizziness occasionally. Denies PND/Orthopnea. Weight at home 205-209 pounds. SBP at home 90s.  Says he had chili beans and saltines last night. Compliant with medications.   Labs (5/14): K 3.6, creatinine 0.86, BNP 11508  Labs (6/14): K 4, creatinine 1.1 Labs (7/14): Creatinine 1.57 Potassium 4.3  ROS: All systems negative except as listed in HPI, PMH and Problem List.  PMH: 1. PNA (H1N1 influenza + pneumococcus) in [id number] complicated by respiratory failure/ARDS. Required tracheostomy, now weaned off. Had left empyema requiring chest tube.  2. Prostate CA s/p radiation treatment. Has indwelling foley.  3. OSA  4. HTN  5. Hyperlipidemia  6. COPD: History of heavy smoking. PFTs (4/14) with mixed obstructive (COPD) and restrictive (post-ARDS) picture.  7. Type II diabetes  8. Anemia  of chronic disease.  9. Ischemic cardiomyopathy: Echo (1/14) with severely dilated LV, EF 30-35%, diffuse hypokinesis worse in the anteroseptal wall, grade II diastolic dysfunction, mild MR, AS interpreted as "moderate to severe" with aortic valve mean gradient 23 mmHg. Echo (4/14) witih EF 35-40%, mid to apical anteroseptal akinesis, moderate to severe AS with mean gradient 33 mmHg, AVA 0.91 cm^2. Echo (5/14) post CABG showed EF 50%,  anteroseptal hypokinesis, bioprosthetic aortic valve well-seated.  10. Aortic stenosis: Moderate to severe by echo in 4/14. Bioprosthetic #21 [facility] Ease aortic valve replacement in 4/14.  11. CAD: LHC (4/14) with 95% pLAD, 70-80% ostial ramus, 70% mLCx, 90% pRCA, EF 35%. CABG 4/14 with LIMA-LAD, seq SVG-ramus and OM1, SVG-PDA.  12. Carotid stenosis: Carotid dopplers (0000000) with A999333 LICA stenosis.  13. AAA: 5/14 CT showed > 6 cm AAA, also right iliac aneurysm. Not stent graft candidate.  7/14 CT showed 7 cm AAA.  14. CKD: AKI in 7/14 from contrast nephropathy.  15. Contrast allergy.   SH: Quit smoking in 1/14, married, no ETOH, lives in [place].   FH: No premature CAD   Current Outpatient Prescriptions  Medication Sig Dispense Refill  . allopurinol (ZYLOPRIM) 100 MG tablet Take 2 tablets (200 mg total) by mouth daily.  60 tablet  2  . aspirin EC 325 MG EC tablet Take 1 tablet (325 mg total) by mouth daily.  30 tablet    . atorvastatin (LIPITOR) 80 MG tablet Take 1 tablet (80 mg total) by mouth daily.  30 tablet  6  . bisoprolol (ZEBETA) 5 MG tablet Take 0.5 tablets (2.5 mg total) by mouth at bedtime.  15 tablet  3  . ciprofloxacin (CIPRO) 500 MG tablet Take 500 mg by mouth 2 (two) times daily.      . clonazePAM (KLONOPIN) 0.5 MG tablet Take 1 tablet (0.5 mg total) by mouth 2 (two) times daily as needed (anxiety).  60 tablet  3  . furosemide (LASIX) 80 MG tablet Take 2 tablets (160 mg total) by mouth 2 (two) times daily.  120 tablet  0  . hydrocortisone cream 1 % Apply 1 application topically daily.      Marland Kitchen ipratropium-albuterol (DUONEB) 0.5-2.5 (3) MG/3ML SOLN Take 3 mLs by nebulization every 6 (six) hours.      . nitroGLYCERIN (NITROSTAT) 0.4 MG SL tablet Place 1 tablet (0.4 mg total) under the tongue every 5 (five) minutes as needed for chest pain.  25 tablet  12  . pantoprazole (PROTONIX) 40 MG tablet Take 1 tablet by mouth daily.      . potassium chloride (KLOR-CON 10) 10 MEQ  tablet Take 10 mEq by mouth 2 (two) times daily.       . silodosin (RAPAFLO) 8 MG CAPS capsule Take 8 mg by mouth daily with breakfast.      . sitaGLIPtin (JANUVIA) 100 MG tablet Take 100 mg by mouth daily.      Marland Kitchen guaifenesin (ROBITUSSIN) 100 MG/5ML syrup Take 300 mg by mouth 4 (four) times daily as needed for cough.       No current facility-administered medications for this encounter.     PHYSICAL EXAM: Filed Vitals:   01/20/13 1521  BP: 90/60  Pulse: 101  Weight: 213 lb (96.616 kg)  SpO2: 91%    General:  Chronically ill appearing. No resp difficulty.  Wife present  HEENT: normal Neck: supple. JVP 7. Carotids 2+ bilaterally; no bruits. No lymphadenopathy or thryomegaly appreciated. Cor: PMI normal. Regular rate &  rhythm. No rubs, gallops. 1/6 SEM RUSB.  Lungs: Slight crackles at bases.  Abdomen: soft, nontender, nondistended. No hepatosplenomegaly. No bruits or masses. Good bowel sounds. Extremities: no cyanosis, clubbing, rash, R and LLE 1+ edema 1/2 to knees bilaterally. Neuro: alert & orientedx3, cranial nerves grossly intact. Moves all 4 extremities w/o difficulty. Affect pleasant.   ASSESSMENT & PLAN: 1. CAD: Status post CABG for 3VD. No chest pain  - I will have him start cardiac rehab in [place] in about 2 wks.  - Continue ASA, statin.  - He is taking Coreg at 3.125 mg bid which I will continue.  2. Chronic systolic CHF: EF around [id number] prior to surgery, appeared to improve post-op with last echo showing EF around 50%. NYHA class III symptoms today. He is relatively stable today.  - Continue Lasix 160 mg bid.  If diuresis is poor on this regimen, will put him back on torsemide.    - BMET/BNP in 2 wks.  - Continue Coreg, no ACEI with CKD and recent AKI. 3. 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 I suspect that intrinsic lung disease is a contributor to his dyspnea and hypoxemia. He will continue home oxygen.  4. Carotid stenosis: Repeat  carotid dopplers in 10/14.  5. Hyperlipidemia: Continue statin, Check lipids with other labs in 2 wks.  6. AAA: 7 cm on last imaging with right iliac aneurysm. He is not a candidate for stent grafting. He will need to stabilize out/recover from CABG-AVR prior to vascular surgery. He will followup with Dr. Kellie Simmering.  7. Status post bioprosthetic AVR: well-seated valve 5/14.   Follow up in 2 weeks.   Loralie Champagne 01/21/2013

## 2013-01-20 NOTE — Patient Instructions (Addendum)
Follow up in 2 weeks  Do the following things EVERYDAY: 1) Weigh yourself in the morning before breakfast. Write it down and keep it in a log. 2) Take your medicines as prescribed 3) Eat low salt foods--Limit salt (sodium) to 2000 mg per day.  4) Stay as active as you can everyday 5) Limit all fluids for the day to less than 2 liters 

## 2013-01-21 ENCOUNTER — Ambulatory Visit (INDEPENDENT_AMBULATORY_CARE_PROVIDER_SITE_OTHER): Payer: Medicare Other | Admitting: Adult Health

## 2013-01-21 ENCOUNTER — Encounter: Payer: Self-pay | Admitting: Adult Health

## 2013-01-21 ENCOUNTER — Ambulatory Visit (INDEPENDENT_AMBULATORY_CARE_PROVIDER_SITE_OTHER): Payer: Medicare Other | Admitting: Pulmonary Disease

## 2013-01-21 VITALS — BP 142/60 | HR 99 | Temp 97.8°F | Ht 65.0 in | Wt 212.0 lb

## 2013-01-21 DIAGNOSIS — J8410 Pulmonary fibrosis, unspecified: Secondary | ICD-10-CM

## 2013-01-21 DIAGNOSIS — J449 Chronic obstructive pulmonary disease, unspecified: Secondary | ICD-10-CM

## 2013-01-21 LAB — PULMONARY FUNCTION TEST

## 2013-01-21 MED ORDER — BUDESONIDE 0.25 MG/2ML IN SUSP: 0.2500 mg | Freq: Every day | RESPIRATORY_TRACT | Status: DC

## 2013-01-21 MED ORDER — BUDESONIDE 0.25 MG/2ML IN SUSP: 0.2500 mg | Freq: Two times a day (BID) | RESPIRATORY_TRACT | Status: DC

## 2013-01-21 NOTE — Patient Instructions (Addendum)
Begin Budesonide Neb Twice daily   Continue on Ipratropium /Albuterol Neb Four times a day   Advance activity as tolerated.  Follow up Dr. Elsworth Soho  In 6 weeks and As needed

## 2013-01-21 NOTE — Progress Notes (Signed)
PFT done today. 

## 2013-01-23 ENCOUNTER — Inpatient Hospital Stay (HOSPITAL_COMMUNITY): Admission: RE | Admit: 2013-01-23 | Payer: BC Managed Care – PPO | Source: Ambulatory Visit

## 2013-01-23 NOTE — Assessment & Plan Note (Addendum)
PFT 01/21/13 >FEV1 58%, ratio 58, +25% change after BD , DLCO 38 .  Moderate airflow obstruction with possible asthma component  COPD complicated by pulmonary fibrosis w/ hx of ARDS (severely reduced diffucing capacity _  He would like to stay on nebs , will add Budesonide Neb Twice daily   Cont on Duoneb Four times a day   (unable to add brovana as MCR will not cover albuterol more than 1 x daily )

## 2013-01-23 NOTE — Progress Notes (Addendum)
Subjective:    Patient ID: [name], male    DOB: Nov 27, 1940, 72 y.o.   MRN: [id number]  HPI 72 yo ex-heavy smoker with history of ARDS/respiratory failure and tracheostomy in 07/2012 as well as DM, HTN, and COPD on home O2 2L/m was admitted 6/20  dyspnea, desaturation at 89% and weight gain up 17 lbs. CXR showed 6/19 Mild vascular congestion and small pleural effusions, unchanged from 12/03/12, pBNP 2056. He required aggressive diuresis with up to 7 L removed.  He has an extensive hx with prolonged hospitalization in 1/14 with H1N1 influenza and pneumococcal PNA. This progressed to ARDS requiring tracheostomy. He also had a left empyema requiring chest tube, septic shock with elevated troponin, and AKI which resolved. He was admitted again in 4/14 from his nursing home with exertional dyspnea and orthopnea. Echo then showed EF 35% with global hypokinesis that looked worse in the anteroseptal wall. He had aortic stenosis - moderate to severe & severe 3 vessel disease on cath. He then had CABG-AVR (bioprosthetic valve). He was discharged on home O2.  Echo in 5/14 prior to discharge showed EF up to 50%.  He also uses CPAP during sleep & is compliant.  He smoked 2-3 PPD prior to his admission in Jan but has quit since.  10/23/12 PFTs reveal severe obstructive / restrictive disease & severely reduced DLCO.   He was seen by pulmonary during admission with recommendations to continue on Duoneb and adding LABA/LAMA at discharge. Continue on O2 and CPAP.   Since discharge he reports breathing is doing well since discharge, does not wear O2 continuously.  currently on doxycycline x7days by Urology. He is still weak but is feeling better. No flare in cough or wheezing .   PFT today showed mod airflow obstruction with good response to BD  PFT 01/21/13 >FEV1 58%, ratio 58, +25% change after BD , DLCO 38 .   Review of Systems Constitutional:   No  weight loss, night sweats,  Fevers, chills, + fatigue, or   lassitude.  HEENT:   No headaches,  Difficulty swallowing,  Tooth/dental problems, or  Sore throat,                No sneezing, itching, ear ache, nasal congestion, post nasal drip,   CV:  No chest pain,  Orthopnea, PND,   anasarca, dizziness, palpitations, syncope.   GI  No heartburn, indigestion, abdominal pain, nausea, vomiting, diarrhea, change in bowel habits, loss of appetite, bloody stools.   Resp:    No chest wall deformity  Skin: no rash or lesions.  GU: no dysuria, change in color of urine, no urgency or frequency.  No flank pain, no hematuria   MS:    No back pain.  Psych:  No change in mood or affect. No depression or anxiety.  No memory loss.         Objective:   Physical Exam GEN: A/Ox3; pleasant , NAD , chronically ill appearing   HEENT:  [place]/AT,  EACs-clear, TMs-wnl, NOSE-clear, THROAT-clear, no lesions, no postnasal drip or exudate noted.   NECK:  Supple w/ fair ROM; no JVD; normal carotid impulses w/o bruits; no thyromegaly or nodules palpated; no lymphadenopathy. Old  trach scar   RESP  Clear  P & A; w/o, wheezes/ rales/ or rhonchi.no accessory muscle use, no dullness to percussion  CARD:  RRR, no m/r/g  , tr-1+ peripheral edema, pulses intact, no cyanosis or clubbing.  GI:   Soft & nt; nml  bowel sounds; no organomegaly or masses detected.  Musco: Warm bil, no deformities or joint swelling noted.   Neuro: alert, no focal deficits noted.    Skin: Warm, no lesions or rashes         Assessment & Plan:

## 2013-02-03 ENCOUNTER — Ambulatory Visit (HOSPITAL_BASED_OUTPATIENT_CLINIC_OR_DEPARTMENT_OTHER): Payer: Medicare Other | Admitting: Radiology

## 2013-02-03 DIAGNOSIS — I2589 Other forms of chronic ischemic heart disease: Secondary | ICD-10-CM

## 2013-02-03 DIAGNOSIS — J9620 Acute and chronic respiratory failure, unspecified whether with hypoxia or hypercapnia: Secondary | ICD-10-CM

## 2013-02-03 DIAGNOSIS — I359 Nonrheumatic aortic valve disorder, unspecified: Secondary | ICD-10-CM

## 2013-02-03 DIAGNOSIS — I2510 Atherosclerotic heart disease of native coronary artery without angina pectoris: Secondary | ICD-10-CM

## 2013-02-03 DIAGNOSIS — I509 Heart failure, unspecified: Secondary | ICD-10-CM

## 2013-02-03 DIAGNOSIS — I5022 Chronic systolic (congestive) heart failure: Secondary | ICD-10-CM

## 2013-02-03 NOTE — Progress Notes (Signed)
Echocardiogram performed.  

## 2013-02-04 ENCOUNTER — Ambulatory Visit (HOSPITAL_COMMUNITY)
Admission: RE | Admit: 2013-02-04 | Discharge: 2013-02-04 | Disposition: A | Discharge status: Home / Self Care | Payer: Medicare Other | Source: Ambulatory Visit | Attending: Internal Medicine | Admitting: Internal Medicine

## 2013-02-04 ENCOUNTER — Telehealth (HOSPITAL_COMMUNITY): Payer: Self-pay | Admitting: Anesthesiology

## 2013-02-04 ENCOUNTER — Encounter (HOSPITAL_COMMUNITY): Payer: Self-pay

## 2013-02-04 VITALS — BP 100/68 | HR 81 | Wt 214.1 lb

## 2013-02-04 DIAGNOSIS — I5022 Chronic systolic (congestive) heart failure: Secondary | ICD-10-CM | POA: Insufficient documentation

## 2013-02-04 DIAGNOSIS — I509 Heart failure, unspecified: Secondary | ICD-10-CM | POA: Insufficient documentation

## 2013-02-04 DIAGNOSIS — R0989 Other specified symptoms and signs involving the circulatory and respiratory systems: Secondary | ICD-10-CM | POA: Insufficient documentation

## 2013-02-04 DIAGNOSIS — E785 Hyperlipidemia, unspecified: Secondary | ICD-10-CM | POA: Insufficient documentation

## 2013-02-04 DIAGNOSIS — Z952 Presence of prosthetic heart valve: Secondary | ICD-10-CM | POA: Insufficient documentation

## 2013-02-04 DIAGNOSIS — R0609 Other forms of dyspnea: Secondary | ICD-10-CM | POA: Insufficient documentation

## 2013-02-04 DIAGNOSIS — Z951 Presence of aortocoronary bypass graft: Secondary | ICD-10-CM | POA: Insufficient documentation

## 2013-02-04 DIAGNOSIS — I714 Abdominal aortic aneurysm, without rupture: Secondary | ICD-10-CM

## 2013-02-04 DIAGNOSIS — I5032 Chronic diastolic (congestive) heart failure: Secondary | ICD-10-CM

## 2013-02-04 DIAGNOSIS — I359 Nonrheumatic aortic valve disorder, unspecified: Secondary | ICD-10-CM | POA: Insufficient documentation

## 2013-02-04 DIAGNOSIS — I255 Ischemic cardiomyopathy: Secondary | ICD-10-CM | POA: Insufficient documentation

## 2013-02-04 DIAGNOSIS — R0601 Orthopnea: Secondary | ICD-10-CM | POA: Insufficient documentation

## 2013-02-04 DIAGNOSIS — I2510 Atherosclerotic heart disease of native coronary artery without angina pectoris: Secondary | ICD-10-CM | POA: Insufficient documentation

## 2013-02-04 DIAGNOSIS — R0600 Dyspnea, unspecified: Secondary | ICD-10-CM

## 2013-02-04 HISTORY — DX: Chronic systolic (congestive) heart failure: I50.22

## 2013-02-04 LAB — TSH: TSH: 2.947 u[IU]/mL (ref 0.350–4.500)

## 2013-02-04 LAB — T4, FREE: Free T4: 1.1 ng/dL (ref 0.80–1.80)

## 2013-02-04 LAB — BASIC METABOLIC PANEL
CO2: 28 mEq/L (ref 19–32)
Glucose, Bld: 106 mg/dL — ABNORMAL HIGH (ref 70–99)
Potassium: 3.2 mEq/L — ABNORMAL LOW (ref 3.5–5.1)
Sodium: 141 mEq/L (ref 135–145)

## 2013-02-04 LAB — T3, FREE: T3, Free: 3.1 pg/mL (ref 2.3–4.2)

## 2013-02-04 MED ORDER — FUROSEMIDE 80 MG PO TABS: 160.0000 mg | ORAL_TABLET | Freq: Two times a day (BID) | ORAL | Status: DC

## 2013-02-04 MED ORDER — METOLAZONE 2.5 MG PO TABS: 2.5000 mg | ORAL_TABLET | Freq: Every day | ORAL | Status: DC

## 2013-02-04 NOTE — Patient Instructions (Addendum)
Start taking metolazone 2.5 mg if weight is greater than 212 lbs. Take 30 min before you take lasix. If you take metolazone take an extra 20 meq of Potassium. If you take more than 2 doses in a week call Dr. Claris Gladden office.  Follow up in 1 month with Dr. [name].

## 2013-02-04 NOTE — Telephone Encounter (Signed)
Take additional 40 meq today, not 20 meq as stated above. Re-instructed patient if he takes metolazone to take extra 20 meq K+

## 2013-02-04 NOTE — Progress Notes (Signed)
Patient ID: [name], male   DOB: Feb 04, 1941, 72 y.o.   MRN: [id number]   Weight Range  207 pounds.   Baseline proBNP     HPI:  [name] is a 72 yo male with multiple medical problems including diastolic HF, COPD, obesity, CAD/AS s/p CABG/AVR, large AAA and CRI.  He had a prolonged hospitalization in 1/14 with H1N1 influenza and pneumococcal PNA. This progressed to ARDS. He was intubated and ended up with tracheostomy. He has had the trach removed.   He was admitted again in 4/14 from his nursing home with exertional dyspnea and orthopnea. Echo showed EF 35% with global hypokinesis with moderate to severe AS. Cath showed severe 3 vessel disease. He then had CABG-AVR (bioprosthetic valve). He did well post-op considering his comorbidities and is now home with his wife. Echo in 5/14 prior to discharge showed EF up to 50% with well-seated aortic valve.   He had a pulmonary evaluation given persistent hypoxemia, which was thought to be due to COPD and post-infectious pulmonary fibrosis. Most recently, creatinine increased to 2.2 and torsemide was cut back.   Of note, patient had recent vascular surgery followup of his AAA. He had a CT abdomen to follow his AAA on earlier this week, showing 6.9 x 6.8 cm aneurysm. Dr. Kellie Simmering told him that he was not yet physically ready for surgery, will followup in the future.  Admitted 7/4 through 01/05/13 with hyperkalemia 6.5 and elevated creatinine 4.7 thought to be from contrast given with CTA abdomen/pelvis. Coreg, hydralazine, IMDUR, Torsemide were stopped. He was started on 160 mg of lasix twice a day. Discharge weight was 207 pounds.   ECHO 02/03/13 EF 55-60% mild bioprosthetic aortic stenosis  Follow up:  Seen recently and weight was trending up. Echo ordered as above. Continue to gain fluid and weight up about 10 pounds. Was previously on torsemide but said it didn't work for him. Continues to have dyspnea on exertion but doesn't do much. Eats a lot.  Thinks that he thinks he may have hypothyorid issues d/t being cold, hair falling out and skin is dry. Denies PND/orthopnea or CP. Wears CPAP at night. Weight up to 215 lbs. Compliant with medications.   Labs (5/14): K 3.6, creatinine 0.86, BNP 11508  Labs (6/14): K 4, creatinine 1.1 Labs (7/14): Creatinine 1.57 Potassium 4.3  ROS: All systems negative except as listed in HPI, PMH and Problem List.  PMH: 1. PNA (H1N1 influenza + pneumococcus) in [id number] complicated by respiratory failure/ARDS. Required tracheostomy, now weaned off. Had left empyema requiring chest tube.  2. Prostate CA s/p radiation treatment. Has indwelling foley.  3. OSA  4. HTN  5. Hyperlipidemia  6. COPD: History of heavy smoking. PFTs (4/14) with mixed obstructive (COPD) and restrictive (post-ARDS) picture.  7. Type II diabetes  8. Anemia of chronic disease.  9. Ischemic cardiomyopathy: Echo (1/14) with severely dilated LV, EF 30-35%, diffuse hypokinesis worse in the anteroseptal wall, grade II diastolic dysfunction, mild Vincent, AS interpreted as "moderate to severe" with aortic valve mean gradient 23 mmHg. Echo (4/14) witih EF 35-40%, mid to apical anteroseptal akinesis, moderate to severe AS with mean gradient 33 mmHg, AVA 0.91 cm^2. Echo (5/14) post CABG showed EF 50%, anteroseptal hypokinesis, bioprosthetic aortic valve well-seated.  10. Aortic stenosis: Moderate to severe by echo in 4/14. Bioprosthetic #21 [facility] Ease aortic valve replacement in 4/14.  11. CAD: LHC (4/14) with 95% pLAD, 70-80% ostial ramus, 70% mLCx, 90% pRCA, EF 35%. CABG  4/14 with LIMA-LAD, seq SVG-ramus and OM1, SVG-PDA.  12. Carotid stenosis: Carotid dopplers (0000000) with A999333 LICA stenosis.  13. AAA: 5/14 CT showed > 6 cm AAA, also right iliac aneurysm. Not stent graft candidate.  7/14 CT showed 7 cm AAA.  14. CKD: AKI in 7/14 from contrast nephropathy.  15. Contrast allergy.   SH: Quit smoking in 1/14, married, no ETOH, lives in [place].    FH: No premature CAD   Current Outpatient Prescriptions  Medication Sig Dispense Refill  . allopurinol (ZYLOPRIM) 100 MG tablet Take 2 tablets (200 mg total) by mouth daily.  60 tablet  2  . aspirin EC 325 MG EC tablet Take 1 tablet (325 mg total) by mouth daily.  30 tablet    . atorvastatin (LIPITOR) 80 MG tablet Take 1 tablet (80 mg total) by mouth daily.  30 tablet  6  . bisoprolol (ZEBETA) 5 MG tablet Take 0.5 tablets (2.5 mg total) by mouth at bedtime.  15 tablet  3  . budesonide (PULMICORT) 0.25 MG/2ML nebulizer solution Take 2 mLs (0.25 mg total) by nebulization 2 (two) times daily. Dx 496  120 mL  12  . clonazePAM (KLONOPIN) 0.5 MG tablet Take 1 tablet (0.5 mg total) by mouth 2 (two) times daily as needed (anxiety).  60 tablet  3  . furosemide (LASIX) 80 MG tablet Take 2 tablets (160 mg total) by mouth 2 (two) times daily.  120 tablet  3  . guaifenesin (ROBITUSSIN) 100 MG/5ML syrup Take 300 mg by mouth 4 (four) times daily as needed for cough.      . hydrocortisone cream 1 % Apply 1 application topically daily.      Marland Kitchen ipratropium-albuterol (DUONEB) 0.5-2.5 (3) MG/3ML SOLN Take 3 mLs by nebulization every 6 (six) hours.      . nitroGLYCERIN (NITROSTAT) 0.4 MG SL tablet Place 1 tablet (0.4 mg total) under the tongue every 5 (five) minutes as needed for chest pain.  25 tablet  12  . pantoprazole (PROTONIX) 40 MG tablet Take 1 tablet by mouth daily.      . potassium chloride (KLOR-CON 10) 10 MEQ tablet Take 10 mEq by mouth 2 (two) times daily.       . silodosin (RAPAFLO) 8 MG CAPS capsule Take 8 mg by mouth 2 (two) times daily.       . sitaGLIPtin (JANUVIA) 100 MG tablet Take 100 mg by mouth daily.      . metolazone (ZAROXOLYN) 2.5 MG tablet Take 1 tablet (2.5 mg total) by mouth daily.  15 tablet  3   No current facility-administered medications for this encounter.     PHYSICAL EXAM: Filed Vitals:   02/04/13 1133  BP: 100/68  Pulse: 81  Weight: 214 lb 1.9 oz (97.124 kg)  SpO2:  91%    General:  Chronically ill appearing. No resp difficulty. Wearing O2 Wife present  HEENT: normal Neck: supple. JVP 8-9 Carotids 2+ bilaterally; no bruits. No lymphadenopathy or thryomegaly appreciated. Healed trach site Cor: PMI normal. Regular rate & rhythm. No rubs, gallops. 1/6 SEM RUSB.  Lungs: Slight crackles at bases. Diminished throughout Abdomen: obese soft, nontender, nondistended. No bruits or masses. Good bowel sounds. Extremities: no cyanosis, clubbing, rash, R and LLE 2+ edema 1/2 to knees bilaterally. Neuro: alert & orientedx3, cranial nerves grossly intact. Moves all 4 extremities w/o difficulty. Affect pleasant.  ASSESSMENT & PLAN: 1) CAD: Status post CABG for 3VD. No chest pain  - Has not yet started CR  in [place], but encouraged him to go. - Continue ASA, statin.  - Continue bisoprolol 2.5 mg daily   2) Chronic systolic CHF: EF around [id number] prior to surgery in April. ECHO 02/03/13 showed EF 55-60%. - NYHA class III symptoms. Volume status climbing steadily. Says torsemide didn't work for him in past. Have instructed him to take metolazone to 2.5 mg if weight greater than 212 lbs - take with extra potassium tablet. He is to call if taking more than 2x/week. Reinforced need for daily weights and fluid restriction.  - instructed to call if weight starts decreasing rapidly - Continue BB, no ACEI with CKD and recent AKI.  3. 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 I suspect that intrinsic lung disease is a contributor to his dyspnea and hypoxemia. He will continue home oxygen.   4. Carotid stenosis: Repeat carotid dopplers in 10/14.   5. Hyperlipidemia: Continue statin. Lipids not checked d/t he was not fasting.  6. AAA: 7 cm on last imaging with right iliac aneurysm. He is not a candidate for stent grafting. He will need to stabilize out/recover from CABG-AVR prior to vascular surgery. He will followup with Dr. Kellie Simmering. Reinforced need for  cardiac rehab  7. Status post bioprosthetic AVR: well-seated by echo   9. Obesity: encouraged weight loss.  Follow up in 1 month with Dr. Rudi Coco BensimhonMD 8:52 PM

## 2013-02-04 NOTE — Telephone Encounter (Signed)
K+ 3.2, Cr 1.11 and BUN 24. Take extra 20 meq K+ today.

## 2013-02-06 ENCOUNTER — Telehealth (HOSPITAL_COMMUNITY): Payer: Self-pay | Admitting: Cardiology

## 2013-02-06 NOTE — Telephone Encounter (Signed)
Hold off for now 

## 2013-02-06 NOTE — Telephone Encounter (Signed)
Sheri called with multiple concerns about starting this pt for cardiac rehab. Pt called as instructed by provider to begin however scheduling was placed on hold again as the medical and program director both feel he is too high risk to begin program (82mm AA, uncontrolled HTN, tachycardia)  His referral was deferred in the past as well.  Please advise

## 2013-02-10 NOTE — Telephone Encounter (Signed)
Left detailed message for [facility]

## 2013-02-14 ENCOUNTER — Encounter: Payer: Self-pay | Admitting: Pulmonary Disease

## 2013-02-28 ENCOUNTER — Encounter: Payer: Self-pay | Admitting: Vascular Surgery

## 2013-03-04 ENCOUNTER — Other Ambulatory Visit: Payer: BC Managed Care – PPO

## 2013-03-04 ENCOUNTER — Encounter (INDEPENDENT_AMBULATORY_CARE_PROVIDER_SITE_OTHER): Payer: Medicare Other | Admitting: *Deleted

## 2013-03-04 ENCOUNTER — Other Ambulatory Visit (INDEPENDENT_AMBULATORY_CARE_PROVIDER_SITE_OTHER): Payer: Medicare Other | Admitting: *Deleted

## 2013-03-04 ENCOUNTER — Ambulatory Visit (INDEPENDENT_AMBULATORY_CARE_PROVIDER_SITE_OTHER): Payer: Medicare Other | Admitting: Vascular Surgery

## 2013-03-04 ENCOUNTER — Encounter: Payer: Self-pay | Admitting: Vascular Surgery

## 2013-03-04 ENCOUNTER — Ambulatory Visit: Payer: BC Managed Care – PPO | Admitting: Vascular Surgery

## 2013-03-04 VITALS — BP 111/76 | HR 86 | Ht 65.0 in | Wt 216.1 lb

## 2013-03-04 DIAGNOSIS — I739 Peripheral vascular disease, unspecified: Secondary | ICD-10-CM

## 2013-03-04 DIAGNOSIS — I714 Abdominal aortic aneurysm, without rupture: Secondary | ICD-10-CM

## 2013-03-04 DIAGNOSIS — Z48812 Encounter for surgical aftercare following surgery on the circulatory system: Secondary | ICD-10-CM

## 2013-03-04 DIAGNOSIS — I6529 Occlusion and stenosis of unspecified carotid artery: Secondary | ICD-10-CM

## 2013-03-04 NOTE — Addendum Note (Signed)
Addended by: Dorthula Rue L on: 03/04/2013 04:18 PM   Modules accepted: Orders

## 2013-03-04 NOTE — Progress Notes (Signed)
Subjective:     Patient ID: [name], male   DOB: 23-Feb-1941, 72 y.o.   MRN: [id number]  HPI this 72 year old male returns today for further discussion regarding his large-7 cm-nominal aortic aneurysm. He continues to recuperate from his aortic valve replacement and coronary artery bypass grafting done by Dr. Roxan Hockey several months ago. He does have severe COPD and has had a history of H1 and 1 pneumonia and ARDS requiring tracheostomy in late 2013. He is getting stronger and beginning to ambulate more. He is not participating in cardiac rehabilitation because of rehabilitation feels that he is too high of a risk because of his large aortic aneurysm. He fell out of bed 3 days ago and bruised his ribs on the left chest which causes some discomfort with breathing. He does take home oxygen frequently but not all of the time.  Past Medical History  Diagnosis Date  . Hypertension   . Hyperlipidemia   . OSA (obstructive sleep apnea)     on cpap  . Prediabetes   . CHF (congestive heart failure) 07/2012; 10/17/2012  . COPD (chronic obstructive pulmonary disease)     a.  History of heavy smoking. PFTs (4/14) with mixed obstructive (COPD) and restrictive (post-ARDS) picture.   . Pneumonia 07/2012    PNA (H1N1 influenza + pneumococcus) in [id number] complicated by respiratory failure/ARDS. Required tracheostomy, now weaned off. Had left empyema requiring chest tube.   . Diabetes mellitus without complication   . History of blood transfusion     "lots since January" (10/17/2012)  . Anxiety   . Ischemic cardiomyopathy   . Anemia of chronic disease   . Prostate cancer      s/p radiation treatment. Has indwelling foley.   . Aortic stenosis     Moderate to severe by echo in 4/14. Bioprosthetic #21 [facility] Ease aortic valve replacement in 4/14.   Marland Kitchen CAD (coronary artery disease)     a. 4/14 CABG: LIMA-LAD, seq SVG-ramus & OM1, SVG-PDA  . Carotid arterial disease     Carotid dopplers (0000000) with  A999333 LICA stenosis.   Marland Kitchen AAA (abdominal aortic aneurysm)     5/14 CT showed > 6 cm AAA, also right iliac aneurysm. Not stent graft candidate.   . Esophageal reflux   . Ischemic cardiomyopathy     a. 4/14 LHC: pLAD 95, ost ramus 70-80, mLCx 90, EF 30%  . Chronic systolic heart failure     a. 1/14 ECHO: sev dil LV, EF30-35%, diff HK, mild MR, AS severe, AV grad 35 b. 8/14 ECHO: EF 55-60%, mild biopros AV sten mn grad. 25, RV mild dil, RA mild dil    History  Substance Use Topics  . Smoking status: Former Smoker -- 2.00 packs/day for 58 years    Types: Cigarettes    Quit date: 07/20/2012  . Smokeless tobacco: Never Used  . Alcohol Use: No     Comment: 10/17/2012 "years since I've had a drink; never had problem with it"    Family History  Problem Relation Age of Onset  . Congestive Heart Failure    . Heart disease      Allergies  Allergen Reactions  . Omnipaque [Iohexol] Other (See Comments)    Decreased kidney function  . Cephalosporins Rash    Blisters    Current outpatient prescriptions:Acetaminophen (TYLENOL PO), Take 325 mg by mouth as needed., Disp: , Rfl: ;  allopurinol (ZYLOPRIM) 100 MG tablet, Take 2 tablets (200 mg total) by mouth  daily., Disp: 60 tablet, Rfl: 2;  aspirin EC 325 MG EC tablet, Take 1 tablet (325 mg total) by mouth daily., Disp: 30 tablet, Rfl: ;  atorvastatin (LIPITOR) 80 MG tablet, Take 1 tablet (80 mg total) by mouth daily., Disp: 30 tablet, Rfl: 6 bisoprolol (ZEBETA) 5 MG tablet, Take 0.5 tablets (2.5 mg total) by mouth at bedtime., Disp: 15 tablet, Rfl: 3;  budesonide (PULMICORT) 0.25 MG/2ML nebulizer solution, Take 2 mLs (0.25 mg total) by nebulization 2 (two) times daily. Dx 496, Disp: 120 mL, Rfl: 12;  clonazePAM (KLONOPIN) 0.5 MG tablet, Take 1 tablet (0.5 mg total) by mouth 2 (two) times daily as needed (anxiety)., Disp: 60 tablet, Rfl: 3 furosemide (LASIX) 80 MG tablet, Take 2 tablets (160 mg total) by mouth 2 (two) times daily., Disp: 120 tablet,  Rfl: 3;  ipratropium-albuterol (DUONEB) 0.5-2.5 (3) MG/3ML SOLN, Take 3 mLs by nebulization every 6 (six) hours., Disp: , Rfl: ;  nitroGLYCERIN (NITROSTAT) 0.4 MG SL tablet, Place 1 tablet (0.4 mg total) under the tongue every 5 (five) minutes as needed for chest pain., Disp: 25 tablet, Rfl: 12 pantoprazole (PROTONIX) 40 MG tablet, Take 1 tablet by mouth daily., Disp: , Rfl: ;  potassium chloride (KLOR-CON 10) 10 MEQ tablet, Take 20 mEq by mouth 2 (two) times daily. , Disp: , Rfl: ;  silodosin (RAPAFLO) 8 MG CAPS capsule, Take 8 mg by mouth 2 (two) times daily. , Disp: , Rfl: ;  guaifenesin (ROBITUSSIN) 100 MG/5ML syrup, Take 300 mg by mouth 4 (four) times daily as needed for cough., Disp: , Rfl:  hydrocortisone cream 1 %, Apply 1 application topically daily., Disp: , Rfl: ;  metolazone (ZAROXOLYN) 2.5 MG tablet, Take 1 tablet (2.5 mg total) by mouth daily., Disp: 15 tablet, Rfl: 3;  sitaGLIPtin (JANUVIA) 100 MG tablet, Take 100 mg by mouth daily., Disp: , Rfl:   BP 111/76  Pulse 86  Ht 5\' 5"  (1.651 m)  Wt 216 lb 1.6 oz (98.022 kg)  BMI 35.96 kg/m2  SpO2 97%  Body mass index is 35.96 kg/(m^2).           Review of Systems no change since previous visit-continues to have dyspnea on exertion, PND, orthopnea, left chest discomfort with deep breathing because of bruised her ribs,    Objective:   Physical Exam BP 111/76  Pulse 86  Ht 5\' 5"  (1.651 m)  Wt 216 lb 1.6 oz (98.022 kg)  BMI 35.96 kg/m2  SpO2 97%  Gen. chronically ill-obese-male in no apparent distress. Breathing comfortably without nasal oxygen. No audible wheezing noted. Lungs no rhonchi or wheezing Cardiovascular regular rhythm Abdomen obese-7 cm pulsatile mass which is nontender 3+ femoral pulses palpable bilaterally      Assessment:     Discussed situation at length with patient and his wife and daughter regarding high risk of surgery at this point in time-particularly from pulmonary standpoint with high likelihood of  need for postoperative ventilation and or repeat tracheostomy    Plan:     Will continue to observe patient as he gets stronger and have him return in 3 months with repeat duplex scan of his aortic aneurysm Believe the risk of surgery at this point would be higher than risk of rupture Not a candidate for aortic stent grafting  Return in 3 months for further followup-patient and family realize that risk of rupture is significant and could lead to death but currently that followup is the best plan rather than immediate operative approach

## 2013-03-14 ENCOUNTER — Encounter: Payer: Self-pay | Admitting: Nurse Practitioner

## 2013-03-14 ENCOUNTER — Ambulatory Visit (INDEPENDENT_AMBULATORY_CARE_PROVIDER_SITE_OTHER): Payer: Medicare Other | Admitting: Nurse Practitioner

## 2013-03-14 ENCOUNTER — Other Ambulatory Visit: Payer: Self-pay | Admitting: *Deleted

## 2013-03-14 VITALS — BP 90/64 | HR 88 | Ht 66.0 in | Wt 216.1 lb

## 2013-03-14 DIAGNOSIS — R0602 Shortness of breath: Secondary | ICD-10-CM

## 2013-03-14 DIAGNOSIS — E876 Hypokalemia: Secondary | ICD-10-CM

## 2013-03-14 LAB — CBC WITH DIFFERENTIAL/PLATELET
Basophils Absolute: 0 10*3/uL (ref 0.0–0.1)
Basophils Relative: 1 % (ref 0.0–3.0)
Eosinophils Absolute: 0.3 10*3/uL (ref 0.0–0.7)
Eosinophils Relative: 5.2 % — ABNORMAL HIGH (ref 0.0–5.0)
HCT: 38.4 % — ABNORMAL LOW (ref 39.0–52.0)
Hemoglobin: 12.8 g/dL — ABNORMAL LOW (ref 13.0–17.0)
Lymphocytes Relative: 13.2 % (ref 12.0–46.0)
Lymphs Abs: 0.7 10*3/uL (ref 0.7–4.0)
MCHC: 33.3 g/dL (ref 30.0–36.0)
MCV: 89.9 fl (ref 78.0–100.0)
Monocytes Absolute: 0.6 10*3/uL (ref 0.1–1.0)
Monocytes Relative: 11 % (ref 3.0–12.0)
Neutro Abs: 3.6 10*3/uL (ref 1.4–7.7)
Neutrophils Relative %: 69.6 % (ref 43.0–77.0)
Platelets: 187 10*3/uL (ref 150.0–400.0)
RBC: 4.27 Mil/uL (ref 4.22–5.81)
RDW: 17.7 % — ABNORMAL HIGH (ref 11.5–14.6)
WBC: 5.2 10*3/uL (ref 4.5–10.5)

## 2013-03-14 LAB — BASIC METABOLIC PANEL
BUN: 50 mg/dL — ABNORMAL HIGH (ref 6–23)
CO2: 32 mEq/L (ref 19–32)
Calcium: 9.5 mg/dL (ref 8.4–10.5)
Chloride: 96 mEq/L (ref 96–112)
Creatinine, Ser: 1.4 mg/dL (ref 0.4–1.5)
GFR: 52.67 mL/min — ABNORMAL LOW (ref >60.00)
Glucose, Bld: 150 mg/dL — ABNORMAL HIGH (ref 70–99)
Potassium: 2.8 mEq/L — CL (ref 3.5–5.1)
Sodium: 138 mEq/L (ref 135–145)

## 2013-03-14 LAB — BRAIN NATRIURETIC PEPTIDE: Pro B Natriuretic peptide (BNP): 67 pg/mL (ref 0.0–100.0)

## 2013-03-14 MED ORDER — METOLAZONE 2.5 MG PO TABS: 2.5000 mg | ORAL_TABLET | ORAL | Status: DC | PRN

## 2013-03-14 NOTE — Patient Instructions (Addendum)
We are checking labs today  Try to cut back on your caloric intake - this will make a difference in your weight  Stay on your current medicines  Keep up your walking  See Dr. [name] in 2 months  Call the [place] office at [phone] if you have any questions, problems or concerns.

## 2013-03-14 NOTE — Progress Notes (Signed)
[name] Date of Birth: 02-01-1941 Medical Record [id number]  History of Present Illness: [name] is seen back today for a one month check. Seen for Dr. Aundra Pierce. Has a very complex history which includes diastolic HF, COPD, obesity, CAD/AS s/p CABG/AVR, large AAA and CRI.   He had a prolonged hospitalization in 1/14 with H1N1 influenza and pneumococcal PNA. This progressed to ARDS. He was intubated and ended up with tracheostomy. He has had the trach removed.   He was admitted again in 4/14 from his nursing home with exertional dyspnea and orthopnea. Echo showed EF 35% with global hypokinesis with moderate to severe AS. Cath showed severe 3 vessel disease. He then had CABG-AVR (bioprosthetic valve). He did well post-op considering his comorbidities and was able to go home with his wife. Echo in 5/14 prior to discharge showed EF up to 50% with well-seated aortic valve.   He had a pulmonary evaluation given persistent hypoxemia, which was thought to be due to COPD and post-infectious pulmonary fibrosis. Most recently, creatinine increased to 2.2 and torsemide was cut back. Has had recent vascular surgery followup of his AAA. He had a CT abdomen to follow his AAA showing 6.9 x 6.8 cm aneurysm. [name] told him that he was not yet physically ready for surgery, will followup in the future.   Admitted 7/4 through 01/05/13 with hyperkalemia 6.5 and elevated creatinine 4.7 thought to be from contrast given with CTA abdomen/pelvis. Coreg, hydralazine, IMDUR, Torsemide were stopped. He was started on 160 mg of lasix twice a day. Discharge weight was 207 pounds.   Seen a month ago in the CHF clinic. His weight was trending up. Echo updated. Continued to gain fluid and weight up about 10 pounds. Was previously on torsemide but said it didn't work for him. Continues to have dyspnea on exertion but doesn't do much. Eats a lot. Was advised to use prn Zaroxolyn.   Comes back today. Here with his  wife. Seems to have had a good month. Had had a rash - now resolved after seeing dermatology and given some treatment. Now using support stockings for his swelling with improvement. He is actually walking more and walked a mile yesterday. Says he actually feels pretty good. Has had a flu shot for this season. Has trouble keeping his weight below 212 - has used Zaroxolyn 8 times over the past month. He admits that he eats too much - wife says he really likes candy bars at night. Has had some "DNA" test by his PCP to see if his medicines are working. No chest pain. Not dizzy or lightheaded. Saw [name] and will see him again in December - felt that the risk of surgery for his AAA is too greater than the risk of rupture.   Current Outpatient Prescriptions  Medication Sig Dispense Refill  . Acetaminophen (TYLENOL PO) Take 325 mg by mouth as needed.      Marland Kitchen allopurinol (ZYLOPRIM) 100 MG tablet Take 2 tablets (200 mg total) by mouth daily.  60 tablet  2  . aspirin EC 325 MG EC tablet Take 1 tablet (325 mg total) by mouth daily.  30 tablet    . atorvastatin (LIPITOR) 80 MG tablet Take 1 tablet (80 mg total) by mouth daily.  30 tablet  6  . bisoprolol (ZEBETA) 5 MG tablet Take 0.5 tablets (2.5 mg total) by mouth at bedtime.  15 tablet  3  . budesonide (PULMICORT) 0.25 MG/2ML nebulizer solution Take  2 mLs (0.25 mg total) by nebulization 2 (two) times daily. Dx 496  120 mL  12  . clobetasol (TEMOVATE) 0.05 % external solution Apply 1 application topically 2 (two) times daily.       . clonazePAM (KLONOPIN) 0.5 MG tablet Take 1 tablet (0.5 mg total) by mouth 2 (two) times daily as needed (anxiety).  60 tablet  3  . furosemide (LASIX) 80 MG tablet Take 2 tablets (160 mg total) by mouth 2 (two) times daily.  120 tablet  3  . guaifenesin (ROBITUSSIN) 100 MG/5ML syrup Take 300 mg by mouth 4 (four) times daily as needed for cough.      . hydrocortisone cream 1 % Apply 1 application topically daily.      Marland Kitchen  ipratropium-albuterol (DUONEB) 0.5-2.5 (3) MG/3ML SOLN Take 3 mLs by nebulization every 6 (six) hours.      Marland Kitchen ketoconazole (NIZORAL) 2 % shampoo Apply 1 application topically. Every third day      . metolazone (ZAROXOLYN) 2.5 MG tablet Take 2.5 mg by mouth as needed.      . nitroGLYCERIN (NITROSTAT) 0.4 MG SL tablet Place 1 tablet (0.4 mg total) under the tongue every 5 (five) minutes as needed for chest pain.  25 tablet  12  . pantoprazole (PROTONIX) 40 MG tablet Take 1 tablet by mouth daily.      . potassium chloride (KLOR-CON 10) 10 MEQ tablet Take 20 mEq by mouth 2 (two) times daily.       . silodosin (RAPAFLO) 8 MG CAPS capsule Take 8 mg by mouth 2 (two) times daily.       . sitaGLIPtin (JANUVIA) 100 MG tablet Take 100 mg by mouth daily.      . tamsulosin (FLOMAX) 0.4 MG CAPS capsule       . triamcinolone ointment (KENALOG) 0.1 %        No current facility-administered medications for this visit.    Allergies  Allergen Reactions  . Omnipaque [Iohexol] Other (See Comments)    Decreased kidney function  . Cephalosporins Rash    Blisters   PMH: 1. PNA (H1N1 influenza + pneumococcus) in [id number] complicated by respiratory failure/ARDS. Required tracheostomy, now weaned off. Had left empyema requiring chest tube. Now with post-infectious pulmonary fibrosis.  2. Prostate CA s/p radiation treatment. Has indwelling foley.  3. OSA  4. HTN  5. Hyperlipidemia  6. COPD: History of heavy smoking. PFTs (4/14) with mixed obstructive (COPD) and restrictive (post-ARDS) picture.  7. Type II diabetes  8. Anemia of chronic disease.  9. Ischemic cardiomyopathy: Echo (1/14) with severely dilated LV, EF 30-35%, diffuse hypokinesis worse in the anteroseptal wall, grade II diastolic dysfunction, mild MR, AS interpreted as "moderate to severe" with aortic valve mean gradient 23 mmHg. Echo (4/14) witih EF 35-40%, mid to apical anteroseptal akinesis, moderate to severe AS with mean gradient 33 mmHg, AVA 0.91  cm^2. Echo (5/14) post CABG showed EF 50%, anteroseptal hypokinesis, bioprosthetic aortic valve well-seated.  10. Aortic stenosis: Moderate to severe by echo in 4/14. Bioprosthetic #21 [facility] Ease aortic valve replacement in 4/14.  11. CAD: LHC (4/14) with 95% pLAD, 70-80% ostial ramus, 70% mLCx, 90% pRCA, EF 35%. CABG 4/14 with LIMA-LAD, seq SVG-ramus and OM1, SVG-PDA.  12. Carotid stenosis: Carotid dopplers (0000000) with A999333 LICA stenosis.  13. AAA: 6/14 CT showed 6.9 x 6.8 cm AAA, also right iliac aneurysm. Not stent graft candidate.   Past Medical History  Diagnosis Date  . Hypertension   .  Hyperlipidemia   . OSA (obstructive sleep apnea)     on cpap  . Prediabetes   . CHF (congestive heart failure) 07/2012; 10/17/2012  . COPD (chronic obstructive pulmonary disease)     a.  History of heavy smoking. PFTs (4/14) with mixed obstructive (COPD) and restrictive (post-ARDS) picture.   . Pneumonia 07/2012    PNA (H1N1 influenza + pneumococcus) in [id number] complicated by respiratory failure/ARDS. Required tracheostomy, now weaned off. Had left empyema requiring chest tube.   . Diabetes mellitus without complication   . History of blood transfusion     "lots since January" (10/17/2012)  . Anxiety   . Ischemic cardiomyopathy   . Anemia of chronic disease   . Prostate cancer      s/p radiation treatment. Has indwelling foley.   . Aortic stenosis     Moderate to severe by echo in 4/14. Bioprosthetic #21 [facility] Ease aortic valve replacement in 4/14.   Marland Kitchen CAD (coronary artery disease)     a. 4/14 CABG: LIMA-LAD, seq SVG-ramus & OM1, SVG-PDA  . Carotid arterial disease     Carotid dopplers (0000000) with A999333 LICA stenosis.   Marland Kitchen AAA (abdominal aortic aneurysm)     5/14 CT showed > 6 cm AAA, also right iliac aneurysm. Not stent graft candidate.   . Esophageal reflux   . Ischemic cardiomyopathy     a. 4/14 LHC: pLAD 95, ost ramus 70-80, mLCx 90, EF 30%  . Chronic systolic heart failure       a. 1/14 ECHO: sev dil LV, EF30-35%, diff HK, mild MR, AS severe, AV grad 35 b. 8/14 ECHO: EF 55-60%, mild biopros AV sten mn grad. 25, RV mild dil, RA mild dil    Past Surgical History  Procedure Laterality Date  . Transurethral microwave therapy  10/15/2012  . Nasal fracture surgery  1970's  . Tracheostomy  07/2012  . Tracheostomy closure  08/2012  . Anal fissure repair  2008  . Coronary artery bypass graft N/A 10/25/2012    Procedure: CORONARY ARTERY BYPASS GRAFTING (CABG);  Surgeon: Melrose Nakayama, MD;  Location: [place];  Service: Open Heart Surgery;  Laterality: N/A;  times 4 using left internal mammary artery and endoscopically harvested bilateral saphenous vein   . Aortic valve replacement N/A 10/25/2012    Procedure: AORTIC VALVE REPLACEMENT (AVR);  Surgeon: Melrose Nakayama, MD;  Location: [place];  Service: Open Heart Surgery;  Laterality: N/A;  . Intraoperative transesophageal echocardiogram N/A 10/25/2012    Procedure: INTRAOPERATIVE TRANSESOPHAGEAL ECHOCARDIOGRAM;  Surgeon: Melrose Nakayama, MD;  Location: [place];  Service: Open Heart Surgery;  Laterality: N/A;    History  Smoking status  . Former Smoker -- 2.00 packs/day for 58 years  . Types: Cigarettes  . Quit date: 07/20/2012  Smokeless tobacco  . Never Used    History  Alcohol Use No    Comment: 10/17/2012 "years since I've had a drink; never had problem with it"    Family History  Problem Relation Age of Onset  . Congestive Heart Failure    . Heart disease      Review of Systems: The review of systems is per the HPI.  All other systems were reviewed and are negative.  Physical Exam: BP 90/64  Pulse 88  Ht 5\' 6"  (1.676 m)  Wt 216 lb 1.9 oz (98.031 kg)  BMI 34.9 kg/m2 Patient is very pleasant and in no acute distress. He is obese. Weight is up 2 pounds  from last visit. Skin is warm and dry. Color is normal.  HEENT is unremarkable. Normocephalic/atraumatic. PERRL. Sclera are nonicteric. Neck is  supple. No masses. No JVD. Lungs are clear. Cardiac exam shows a regular rate and rhythm. Abdomen is soft. Extremities are without edema. Gait and ROM are intact. No gross neurologic deficits noted.  LABORATORY DATA: Lab Results  Component Value Date   WBC 4.6 01/05/2013   HGB 7.9* 01/05/2013   HCT 25.8* 01/05/2013   PLT 213 01/05/2013   GLUCOSE 106* 02/04/2013   CHOL 151 10/25/2012   TRIG 140 10/25/2012   HDL 47 10/25/2012   LDLCALC 76 10/25/2012   ALT 15 01/04/2013   AST 16 01/04/2013   NA 141 02/04/2013   K 3.2* 02/04/2013   CL 103 02/04/2013   CREATININE 1.11 02/04/2013   BUN 24* 02/04/2013   CO2 28 02/04/2013   TSH 2.947 02/04/2013   INR 1.13 11/04/2012   HGBA1C 5.5 10/24/2012   Echo Study Conclusions from August 2014  - Left ventricle: The cavity size was normal. Wall thickness was increased in a pattern of mild LVH. Systolic function was normal. The estimated ejection fraction was in the range of 55% to 60%. Wall motion was normal; there were no regional wall motion abnormalities. Doppler parameters are consistent with abnormal left ventricular relaxation (grade 1 diastolic dysfunction). - Ventricular septum: Septal bounce noted, suspect this is due to prior cardiac surgery. Less likely constrictive pericarditis with non-dilated IVC. - Aortic valve: There was a bioprosthetic aortic valve. Elevated mean gradient across the bioprosthetic aortic valve, suggesting a mild degree of prosthetic valve stenosis. Could be due to a degree of patient-prosthesis mismatch. No regurgitation. Mean gradient: 36mm Hg (S). Peak gradient: 1mm Hg (S). - Mitral valve: No significant regurgitation. - Left atrium: The atrium was mildly dilated. - Right ventricle: The cavity size was mildly dilated. Systolic function was normal. - Right atrium: The atrium was mildly dilated. - Tricuspid valve: Peak RV-RA gradient: 38mm Hg (S). - Pulmonary arteries: PA peak pressure: 69mm Hg (S). - Inferior vena cava: The vessel was  normal in size; the respirophasic diameter changes were in the normal range (= 50%); findings are consistent with normal central venous pressure.   Assessment / Plan: 1) CAD: Status post CABG for 3VD. No chest pain reported. Now walking more. I have left him on his current regimen. See Dr. Aundra Pierce in 2 months.   2) Chronic systolic CHF: EF around [id number] prior to surgery in April. ECHO 02/03/13 showed EF 55-60%. I think his volume status is ok - I think he is just consuming too many calories - wife agrees - he is willing to work on diet. No change in current therapy.   3. 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 He will continue home oxygen.   4. Carotid stenosis: Repeat carotid dopplers in 10/14.   5. Hyperlipidemia: Continue statin.   6. AAA: 7 cm on last imaging with right iliac aneurysm. He is not a candidate for stent grafting. [name] is following and currently feels the risk of surgery is greater than the risk for rupture.   7. Status post bioprosthetic AVR: well-seated by echo from August of 2014.   9. Obesity: encouraged weight loss. Continue with efforts at walking.   See Dr. Aundra Pierce in 2 months.  Patient is agreeable to this plan and will call if any problems develop in the interim.   Burtis Junes, RN, [place]  [address] [place], [place]  [phone]

## 2013-03-17 ENCOUNTER — Telehealth: Payer: Self-pay | Admitting: Cardiology

## 2013-03-17 NOTE — Telephone Encounter (Signed)
S/w pt will be in town on 9/18 and instead of coming back on 9/19 pt wanted to do lab work than switched appt and stated that would be fine pt was appreciative since it is a 45 minute drive

## 2013-03-17 NOTE — Telephone Encounter (Signed)
Returning you call for Friday.

## 2013-03-20 ENCOUNTER — Other Ambulatory Visit (INDEPENDENT_AMBULATORY_CARE_PROVIDER_SITE_OTHER): Payer: Medicare Other

## 2013-03-20 ENCOUNTER — Ambulatory Visit (INDEPENDENT_AMBULATORY_CARE_PROVIDER_SITE_OTHER): Payer: Medicare Other | Admitting: Pulmonary Disease

## 2013-03-20 ENCOUNTER — Encounter: Payer: Self-pay | Admitting: Pulmonary Disease

## 2013-03-20 VITALS — BP 128/86 | HR 84 | Temp 97.3°F | Ht 65.0 in | Wt 219.2 lb

## 2013-03-20 DIAGNOSIS — J9620 Acute and chronic respiratory failure, unspecified whether with hypoxia or hypercapnia: Secondary | ICD-10-CM

## 2013-03-20 DIAGNOSIS — J449 Chronic obstructive pulmonary disease, unspecified: Secondary | ICD-10-CM

## 2013-03-20 DIAGNOSIS — E876 Hypokalemia: Secondary | ICD-10-CM

## 2013-03-20 LAB — BASIC METABOLIC PANEL
BUN: 47 mg/dL — ABNORMAL HIGH (ref 6–23)
CO2: 30 mEq/L (ref 19–32)
Calcium: 9.2 mg/dL (ref 8.4–10.5)
Chloride: 99 mEq/L (ref 96–112)
Creatinine, Ser: 1.5 mg/dL (ref 0.4–1.5)
GFR: 49.04 mL/min — ABNORMAL LOW (ref >60.00)
Glucose, Bld: 104 mg/dL — ABNORMAL HIGH (ref 70–99)
Potassium: 3.5 mEq/L (ref 3.5–5.1)
Sodium: 136 mEq/L (ref 135–145)

## 2013-03-20 MED ORDER — FLUTICASONE FUROATE-VILANTEROL 100-25 MCG/INH IN AEPB: 1.0000 | INHALATION_SPRAY | Freq: Every day | RESPIRATORY_TRACT | Status: DC

## 2013-03-20 NOTE — Patient Instructions (Addendum)
Trial of BREO - 1 puff daily If this works, STOP budesonide OK tot ake nebs as needed only Oxygen check on walking

## 2013-03-20 NOTE — Progress Notes (Signed)
  Subjective:    Patient ID: [name], male    DOB: 1941/01/15, 72 y.o.   MRN: [id number]  HPI  72 yo ex-heavy smoker with history of ARDS/respiratory failure and tracheostomy in 07/2012 as well as DM, HTN, and COPD on home O2 2L/m   He has an extensive hx with prolonged hospitalization in 1/14 with H1N1 influenza and pneumococcal PNA/ ARDS requiring tracheostomy. He also had a left empyema requiring chest tube, septic shock with elevated troponin, and AKI which resolved. He was admitted again in 4/14 from his nursing home with exertional dyspnea and orthopnea. Echo then showed EF 35% with global hypokinesis that looked worse in the anteroseptal wall. He had aortic stenosis - moderate to severe & severe 3 vessel disease on cath. He then had CABG-AVR (bioprosthetic valve). He was discharged on home O2.  Echo in 5/14 prior to discharge showed EF up to 50%.   He was admitted 12/20/12 dyspnea, desaturation at 89% and weight gain up 17 lbs. CXR showed 6/19 Mild vascular congestion and small pleural effusions, unchanged from 12/03/12, pBNP 2056.  He required aggressive diuresis with up to 7 L removed.   Admitted 7/4 through 01/05/13 with hyperkalemia 6.5 and elevated creatinine 4.7 thought to be from contrast given with CTA abdomen/pelvis. Coreg, hydralazine, IMDUR, Torsemide were stopped. He was started on 160 mg of lasix twice a day. Discharge weight was 207 pounds  He also uses CPAP during sleep & is compliant.  He smoked 2-3 PPD prior to his admission in Jan but has quit since.  10/23/12 PFTs reveal severe obstructive / restrictive disease & severely reduced DLCO.   PFT 01/21/13 >FEV1 58%, ratio 58, +25% change after BD , DLCO 38 .   He has seen Dr. Kellie Simmering for 7 cm AAA on CT angio 12/2012  with right iliac aneurysm -not a candidate for stent grafting or surgery  03/20/2013  Pt reports his breathing has been doing well.  Taken out of rehab since [employer] [facility] -He is exercising and walks a mile on the  treadmill. Denies any wheezing and no chest tx. Occasional cough. He uses the 3 liters O2 PRN. ([facility]) He does use his CPAP everynight x 7-8 hrs a night. ([facility]) Uses nebs tid - questions need He did not desatn on walking 3 laps in the office  Review of Systems neg for any significant sore throat, dysphagia, itching, sneezing, nasal congestion or excess/ purulent secretions, fever, chills, sweats, unintended wt loss, pleuritic or exertional cp, hempoptysis, orthopnea pnd or change in chronic leg swelling. Also denies presyncope, palpitations, heartburn, abdominal pain, nausea, vomiting, diarrhea or change in bowel or urinary habits, dysuria,hematuria, rash, arthralgias, visual complaints, headache, numbness weakness or ataxia.     Objective:   Physical Exam  Gen. Pleasant, well-nourished, in no distress, normal affect ENT - no lesions, no post nasal drip Neck: No JVD, no thyromegaly, no carotid bruits Lungs: no use of accessory muscles, no dullness to percussion, decreased without rales or rhonchi  Cardiovascular: Rhythm regular, heart sounds  normal, no murmurs or gallops, no peripheral edema Abdomen: soft and non-tender, no hepatosplenomegaly, BS normal. Musculoskeletal: No deformities, no cyanosis or clubbing Neuro:  alert, non focal       Assessment & Plan:

## 2013-03-24 NOTE — Assessment & Plan Note (Signed)
Trial of BREO - 1 puff daily If this works, STOP budesonide OK tot ake nebs as needed only

## 2013-03-24 NOTE — Assessment & Plan Note (Signed)
Improved Use O2 as needed only - if satn remains well, can dc O2 next visit

## 2013-03-25 ENCOUNTER — Encounter: Payer: Self-pay | Admitting: Cardiology

## 2013-04-02 ENCOUNTER — Other Ambulatory Visit: Payer: Self-pay | Admitting: Pulmonary Disease

## 2013-04-02 ENCOUNTER — Encounter: Payer: Self-pay | Admitting: Pulmonary Disease

## 2013-04-02 MED ORDER — FLUTICASONE FUROATE-VILANTEROL 100-25 MCG/INH IN AEPB: 1.0000 | INHALATION_SPRAY | Freq: Every day | RESPIRATORY_TRACT | Status: DC

## 2013-04-16 ENCOUNTER — Other Ambulatory Visit: Payer: Self-pay | Admitting: Cardiology

## 2013-04-17 ENCOUNTER — Other Ambulatory Visit: Payer: Self-pay

## 2013-04-17 MED ORDER — POTASSIUM CHLORIDE ER 10 MEQ PO TBCR: 20.0000 meq | EXTENDED_RELEASE_TABLET | Freq: Two times a day (BID) | ORAL | Status: DC

## 2013-04-28 ENCOUNTER — Other Ambulatory Visit (HOSPITAL_COMMUNITY): Payer: Self-pay | Admitting: Adult Health

## 2013-05-15 ENCOUNTER — Encounter: Payer: Self-pay | Admitting: Cardiology

## 2013-05-15 ENCOUNTER — Ambulatory Visit (INDEPENDENT_AMBULATORY_CARE_PROVIDER_SITE_OTHER): Payer: Medicare Other | Admitting: Cardiology

## 2013-05-15 VITALS — BP 112/68 | HR 90 | Ht 65.0 in | Wt 216.0 lb

## 2013-05-15 DIAGNOSIS — R0602 Shortness of breath: Secondary | ICD-10-CM

## 2013-05-15 DIAGNOSIS — I350 Nonrheumatic aortic (valve) stenosis: Secondary | ICD-10-CM

## 2013-05-15 DIAGNOSIS — J80 Acute respiratory distress syndrome: Secondary | ICD-10-CM

## 2013-05-15 DIAGNOSIS — J9589 Other postprocedural complications and disorders of respiratory system, not elsewhere classified: Secondary | ICD-10-CM

## 2013-05-15 DIAGNOSIS — Z951 Presence of aortocoronary bypass graft: Secondary | ICD-10-CM

## 2013-05-15 DIAGNOSIS — I714 Abdominal aortic aneurysm, without rupture: Secondary | ICD-10-CM

## 2013-05-15 DIAGNOSIS — I359 Nonrheumatic aortic valve disorder, unspecified: Secondary | ICD-10-CM

## 2013-05-15 DIAGNOSIS — I5032 Chronic diastolic (congestive) heart failure: Secondary | ICD-10-CM

## 2013-05-15 LAB — BASIC METABOLIC PANEL WITH GFR
BUN: 38 mg/dL — ABNORMAL HIGH (ref 6–23)
CO2: 27 meq/L (ref 19–32)
Calcium: 9.4 mg/dL (ref 8.4–10.5)
Chloride: 103 meq/L (ref 96–112)
Creatinine, Ser: 2.2 mg/dL — ABNORMAL HIGH (ref 0.4–1.5)
GFR: 31.67 mL/min — ABNORMAL LOW
Glucose, Bld: 107 mg/dL — ABNORMAL HIGH (ref 70–99)
Potassium: 4 meq/L (ref 3.5–5.1)
Sodium: 138 meq/L (ref 135–145)

## 2013-05-15 LAB — BRAIN NATRIURETIC PEPTIDE: Pro B Natriuretic peptide (BNP): 50 pg/mL (ref 0.0–100.0)

## 2013-05-15 LAB — LIPID PANEL
Cholesterol: 121 mg/dL (ref 0–200)
HDL: 24.2 mg/dL — ABNORMAL LOW
Total CHOL/HDL Ratio: 5
Triglycerides: 258 mg/dL — ABNORMAL HIGH (ref 0.0–149.0)
VLDL: 51.6 mg/dL — ABNORMAL HIGH (ref 0.0–40.0)

## 2013-05-15 LAB — LDL CHOLESTEROL, DIRECT: Direct LDL: 61.5 mg/dL

## 2013-05-15 NOTE — Progress Notes (Signed)
Patient ID: [name], male   DOB: 1941-03-26, 72 y.o.   MRN: [id number] PCP: Dr. [name]  72 yo with history of ARDS/respiratory failure and tracheostomy in 1/14 as well as DM, HTN, and COPD presented to the ER at [facility] with CHF in 4/14. Patient had a prolonged hospitalization in 1/14 with H1N1 influenza and pneumococcal PNA. This progressed to ARDS. He was intubated and ended up with tracheostomy. He has had the trach removed. He also had a left empyema requiring chest tube, septic shock with elevated troponin, and AKI which resolved.  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   He was admitted again in 4/14 from his nursing home with exertional dyspnea and orthopnea. He had gained 21 lbs. Echo at admission showed EF 35% with global hypokinesis that looked worse in the anteroseptal wall. He also was noted to have aortic stenosis rated as moderate to severe. He was diuresed and cathed, showing severe 3 vessel disease. He then had CABG-AVR (bioprosthetic valve). He did well post-op considering his comorbidities and is now home with his wife. Echo in 5/14 prior to discharge showed EF up to 50% with well-seated aortic valve.   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 Dr. Kellie Simmering told him that he was not yet physically ready for surgery, will followup in the future.    Since last appointment, he has been stable.  He continues on a high dose of Lasix for chronic diastolic CHF.  Weight is stable.  He walks on a treadmill, going for 1/4 mile then stopping to rest.  He does this for about a mile total several days a week.  He is no longer using oxygen.  He has slept on a wedge at night since his valve surgery.  No chest pain.  He is driving and walking more in stores.  He has not been able to do cardiac rehab because of the AAA.  Main complaint is leg aching when he walks.  Of note, ABIs were normal in 9/14.   Labs (5/14): K 3.6, creatinine 0.86, BNP 11508  Labs  (6/14): K 4, creatinine 1.1 Labs (7/14): Creatinine 1.57 Potassium 4.3 Labs (8/14): TSH normal, BNP 1372 Labs (9/14): K 3.5, creatinine 1.5  ROS: All systems negative except as listed in HPI, PMH and Problem List.  PMH: 1. PNA (H1N1 influenza + pneumococcus) in [id number] complicated by respiratory failure/ARDS. Required tracheostomy, now weaned off. Had left empyema requiring chest tube.  Has developed post-infectious pulmonary fibrosis.  2. Prostate CA s/p radiation treatment. Has indwelling foley.  3. OSA: using CPAP 4. HTN  5. Hyperlipidemia  6. COPD: History of heavy smoking. PFTs (4/14) with mixed obstructive (COPD) and restrictive (post-ARDS) picture.  7. Type II diabetes  8. Anemia of chronic disease.  9. Ischemic cardiomyopathy: Echo (1/14) with severely dilated LV, EF 30-35%, diffuse hypokinesis worse in the anteroseptal wall, grade II diastolic dysfunction, mild MR, AS interpreted as "moderate to severe" with aortic valve mean gradient 23 mmHg. Echo (4/14) witih EF 35-40%, mid to apical anteroseptal akinesis, moderate to severe AS with mean gradient 33 mmHg, AVA 0.91 cm^2. Echo (5/14) post CABG showed EF 50%, anteroseptal hypokinesis, bioprosthetic aortic valve well-seated.  10. Aortic stenosis: Moderate to severe by echo in 4/14. Bioprosthetic #21 [facility] Ease aortic valve replacement in 4/14.  11. CAD: LHC (4/14) with 95% pLAD, 70-80% ostial ramus, 70% mLCx, 90% pRCA, EF 35%. CABG 4/14 with LIMA-LAD, seq SVG-ramus and  OM1, SVG-PDA.  12. Carotid stenosis: Carotid dopplers (0000000) with A999333 LICA stenosis.  Carotids (123456) with 123456 LICA stenosis.  13. AAA: 5/14 CT showed > 6 cm AAA, also right iliac aneurysm. Not stent graft candidate.  7/14 CT showed 7 cm AAA.  14. CKD: AKI in 7/14 from contrast nephropathy.  15. Contrast allergy.  16. ABIs 9/14 were normal.  17. Gout  SH: Quit smoking in 1/14, married, no ETOH, lives in [place].   FH: No premature CAD   Current  Outpatient Prescriptions  Medication Sig Dispense Refill  . Acetaminophen (TYLENOL PO) Take 325 mg by mouth as needed.      Marland Kitchen allopurinol (ZYLOPRIM) 100 MG tablet Take 300 mg by mouth daily.      Marland Kitchen aspirin EC 325 MG EC tablet Take 1 tablet (325 mg total) by mouth daily.  30 tablet    . atorvastatin (LIPITOR) 80 MG tablet Take 1 tablet (80 mg total) by mouth daily.  30 tablet  6  . bisoprolol (ZEBETA) 5 MG tablet TAKE 1/2 TABLET BY MOUTH AT BEDTIME  15 tablet  3  . budesonide (PULMICORT) 0.25 MG/2ML nebulizer solution Take 2 mLs (0.25 mg total) by nebulization 2 (two) times daily. Dx 496  120 mL  12  . clonazePAM (KLONOPIN) 0.5 MG tablet Take 1 tablet (0.5 mg total) by mouth 2 (two) times daily as needed (anxiety).  60 tablet  3  . furosemide (LASIX) 80 MG tablet Take 2 tablets (160 mg total) by mouth 2 (two) times daily.  120 tablet  3  . guaifenesin (ROBITUSSIN) 100 MG/5ML syrup Take 300 mg by mouth 4 (four) times daily as needed for cough.      Marland Kitchen ipratropium-albuterol (DUONEB) 0.5-2.5 (3) MG/3ML SOLN Take 3 mLs by nebulization every 6 (six) hours as needed.       . nitroGLYCERIN (NITROSTAT) 0.4 MG SL tablet Place 1 tablet (0.4 mg total) under the tongue every 5 (five) minutes as needed for chest pain.  25 tablet  12  . pantoprazole (PROTONIX) 40 MG tablet Take 1 tablet by mouth daily.      . potassium chloride (KLOR-CON 10) 10 MEQ tablet Take 2 tablets (20 mEq total) by mouth 2 (two) times daily.  120 tablet  6  . sitaGLIPtin (JANUVIA) 100 MG tablet Take 100 mg by mouth daily.      . tamsulosin (FLOMAX) 0.4 MG CAPS capsule 0.4 mg daily.        No current facility-administered medications for this visit.     PHYSICAL EXAM: Filed Vitals:   05/15/13 1217  BP: 112/68  Pulse: 90  Height: 5\' 5"  (1.651 m)  Weight: 216 lb (97.977 kg)  SpO2: 96%    General:  NAD.  HEENT: normal Neck: Thick, JVP 7. Carotids 2+ bilaterally; no bruits. No lymphadenopathy or thryomegaly appreciated. Cor: PMI  normal. Regular rate & rhythm. No rubs, gallops. 1/6 SEM RUSB.  Lungs: Slight crackles at bases.  Abdomen: soft, nontender, nondistended. No hepatosplenomegaly. No bruits or masses. Good bowel sounds. Extremities: no cyanosis, clubbing, rash, 1+ ankle edema. Neuro: alert & orientedx3, cranial nerves grossly intact. Moves all 4 extremities w/o difficulty. Affect pleasant.   ASSESSMENT & PLAN: 1. CAD: Status post CABG for 3VD. No chest pain. - Continue ASA, statin, and bisoprolol.  2. Chronic diastolic CHF: EF around [id number] prior to surgery, appeared to improve post-op with last echo showing EF around 50%. Stable NYHA class III symptoms.  He is on high  dose Lasix and does not appear significantly volume overloaded.  - Continue Lasix 160 mg bid.  If he gains weight, will call and can use intermittent metolazone.  - BMET/BNP today.  - Continue bisoprolol, no ACEI with CKD and recent AKI. 3. 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 I suspect that intrinsic lung disease (post-infectious pulmonary fibrosis) is a contributor to his dyspnea and hypoxemia. He is actually now off home oxygen (just using CPAP at night).  4. Carotid stenosis: Followed at [facility]. 5. Hyperlipidemia: Continue statin, check lipids today.   6. AAA: 7 cm on last imaging with right iliac aneurysm. He is not a candidate for stent grafting. He will need to stabilize out/recover from CABG-AVR prior to vascular surgery. He will followup with Dr. Kellie Simmering.  7. Status post bioprosthetic AVR: well-seated valve 5/14.  8. Leg pain: No evidence for PAD by ABIs.  Loralie Champagne 05/15/2013

## 2013-05-15 NOTE — Patient Instructions (Signed)
Your physician recommends that you return for lab work today--BMET/BNP/Lipid profile.  Your physician recommends that you schedule a follow-up appointment in: 2 months with Dr [name] in the [facility] at [facility]

## 2013-05-19 ENCOUNTER — Other Ambulatory Visit: Payer: Self-pay | Admitting: *Deleted

## 2013-05-25 ENCOUNTER — Other Ambulatory Visit (HOSPITAL_COMMUNITY): Payer: Self-pay | Admitting: Anesthesiology

## 2013-06-02 ENCOUNTER — Encounter: Payer: Self-pay | Admitting: Cardiology

## 2013-06-03 ENCOUNTER — Telehealth: Payer: Self-pay | Admitting: Nurse Practitioner

## 2013-06-03 DIAGNOSIS — I5033 Acute on chronic diastolic (congestive) heart failure: Secondary | ICD-10-CM

## 2013-06-03 DIAGNOSIS — I509 Heart failure, unspecified: Secondary | ICD-10-CM

## 2013-06-03 MED ORDER — METOLAZONE 2.5 MG PO TABS: ORAL_TABLET | ORAL | Status: DC

## 2013-06-03 NOTE — Telephone Encounter (Signed)
I called patient to advise him of Dr. Claris Gladden advice to take Metolazone 2.5 mg tomorrow, Saturday and then every Wednesday and to also take an additional 20 mEq KCl on days he takes Metolazone.  Patient verbalized understanding and agreement.  Patient scheduled to see Dr. [name] on 12/11 at 11:15 (work-in) at [facility]. and is scheduled to come to lab at 10:30 for bmet. Rx for Metolazone sent due to Metolazone not on patient's medication list even though patient reports he has 3 refills from a Rx he received in August.  I discussed diet with patient.  Patient reports eating bran cereal for breakfast, tries to eat fresh fruit daily and usually eats a meat at night with salad and vegetables or potato.  Patient denies using salt or any of the high sodium condiments and states he rarely eats any of the high sodium meats.

## 2013-06-03 NOTE — Telephone Encounter (Signed)
Received patient questionnaire from patient that states he has been gaining weight since decreasing Lasix dosage.  Patient states today's weight is 225 lb.  Patient weighed 216 lb at 11/13 ov.  Patient reports the last time he took Metolazone was approximately 1 1/2 months ago; states his nephrologist, Dr. Jimmy Footman advised patient that he does not want the patient to take that medication.  Patient reports he had pizza last night which he knows is high in sodium.  Patient states diet has varied lately between low and high sodium foods.  Patient denies extremity edema; states abdomen is swollen, but this is nothing new.  Patient denies SOB.  I advised patient that I would send message to Dr. [name] who is working in the hospital today for advice.  Patient verbalized understanding and agreement.

## 2013-06-03 NOTE — Telephone Encounter (Signed)
Would have him take metolazone 2.5 mg 30 minutes before morning Lasix tomorrow (Wednesday) and again on Saturday.  After that, take metolozone 2.5 mg 30 minutes before morning Lasix once a week on Wednesdays.  On days he takes metolazone, take an extra 20 mEq of KCl.  He needs a BMET in 1 week and a followup in 1 week with me.  Can be in CHF clinic (or in [place] office if that is the only option).  Need to emphasize to him that his diet has to be better.  He CANNOT eat pizza.

## 2013-06-12 ENCOUNTER — Encounter: Payer: Self-pay | Admitting: Cardiology

## 2013-06-12 ENCOUNTER — Ambulatory Visit (INDEPENDENT_AMBULATORY_CARE_PROVIDER_SITE_OTHER): Payer: Medicare Other | Admitting: Cardiology

## 2013-06-12 VITALS — BP 110/72 | HR 88 | Ht 65.0 in | Wt 223.0 lb

## 2013-06-12 DIAGNOSIS — Z952 Presence of prosthetic heart valve: Secondary | ICD-10-CM

## 2013-06-12 DIAGNOSIS — I959 Hypotension, unspecified: Secondary | ICD-10-CM

## 2013-06-12 DIAGNOSIS — J8410 Pulmonary fibrosis, unspecified: Secondary | ICD-10-CM

## 2013-06-12 DIAGNOSIS — Z951 Presence of aortocoronary bypass graft: Secondary | ICD-10-CM

## 2013-06-12 DIAGNOSIS — I714 Abdominal aortic aneurysm, without rupture: Secondary | ICD-10-CM

## 2013-06-12 DIAGNOSIS — Z953 Presence of xenogenic heart valve: Secondary | ICD-10-CM

## 2013-06-12 DIAGNOSIS — R0602 Shortness of breath: Secondary | ICD-10-CM

## 2013-06-12 DIAGNOSIS — I5032 Chronic diastolic (congestive) heart failure: Secondary | ICD-10-CM

## 2013-06-12 LAB — BASIC METABOLIC PANEL
BUN: 50 mg/dL — ABNORMAL HIGH (ref 6–23)
Creatinine, Ser: 1.7 mg/dL — ABNORMAL HIGH (ref 0.4–1.5)
GFR: 41.85 mL/min — ABNORMAL LOW (ref >60.00)
Glucose, Bld: 179 mg/dL — ABNORMAL HIGH (ref 70–99)
Potassium: 3 mEq/L — ABNORMAL LOW (ref 3.5–5.1)

## 2013-06-12 LAB — BRAIN NATRIURETIC PEPTIDE: Pro B Natriuretic peptide (BNP): 50 pg/mL (ref 0.0–100.0)

## 2013-06-12 NOTE — Patient Instructions (Signed)
Your physician recommends that you have  lab work today--BMET.BNP.  Your physician recommends that you schedule a follow-up appointment in: 1 month with Dr [name].

## 2013-06-12 NOTE — Progress Notes (Signed)
Patient ID: [name], male   DOB: 04/30/1941, 72 y.o.   MRN: [id number] PCP: Dr. [name]  72 yo with history of ARDS/respiratory failure and tracheostomy in 1/14 as well as DM, HTN, and COPD presented to the ER at [facility] with CHF in 4/14. Patient had a prolonged hospitalization in 1/14 with H1N1 influenza and pneumococcal PNA. This progressed to ARDS. He was intubated and ended up with tracheostomy. He has had the trach removed. He also had a left empyema requiring chest tube, septic shock with elevated troponin, and AKI which resolved.  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   He was admitted again in 4/14 from his nursing home with exertional dyspnea and orthopnea. He had gained 21 lbs. Echo at admission showed EF 35% with global hypokinesis that looked worse in the anteroseptal wall. He also was noted to have aortic stenosis rated as moderate to severe. He was diuresed and cathed, showing severe 3 vessel disease. He then had CABG-AVR (bioprosthetic valve). He did well post-op considering his comorbidities and is now home with his wife. Echo in 5/14 prior to discharge showed EF up to 50% with well-seated aortic valve.   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 Dr. Kellie Simmering told him that he was not yet physically ready for surgery, will followup in the future.    Since last appointment, weight went up and I started him on metolazone once a week.  Weight at home has gone back down about 6 lbs.  He is trying to watch sodium intake.  He tires easily but denies dyspnea walking on flat ground.  No chest pain.  Occasional cough.  No orthopnea/PND.    Labs (5/14): K 3.6, creatinine 0.86, BNP 11508  Labs (6/14): K 4, creatinine 1.1 Labs (7/14): Creatinine 1.57 Potassium 4.3 Labs (8/14): TSH normal, BNP 1372 Labs (9/14): K 3.5, creatinine 1.5 Labs (12/14): K 3.5, creatinine 1.21  ROS: All systems negative except as listed in HPI, PMH and Problem  List.  PMH: 1. PNA (H1N1 influenza + pneumococcus) in [id number] complicated by respiratory failure/ARDS. Required tracheostomy, now weaned off. Had left empyema requiring chest tube.  Has developed post-infectious pulmonary fibrosis.  2. Prostate CA s/p radiation treatment. Has indwelling foley.  3. OSA: using CPAP 4. HTN  5. Hyperlipidemia  6. COPD: History of heavy smoking. PFTs (4/14) with mixed obstructive (COPD) and restrictive (post-ARDS) picture.  7. Type II diabetes  8. Anemia of chronic disease.  9. Ischemic cardiomyopathy: Echo (1/14) with severely dilated LV, EF 30-35%, diffuse hypokinesis worse in the anteroseptal wall, grade II diastolic dysfunction, mild MR, AS interpreted as "moderate to severe" with aortic valve mean gradient 23 mmHg. Echo (4/14) witih EF 35-40%, mid to apical anteroseptal akinesis, moderate to severe AS with mean gradient 33 mmHg, AVA 0.91 cm^2. Echo (5/14) post CABG showed EF 50%, anteroseptal hypokinesis, bioprosthetic aortic valve well-seated.  10. Aortic stenosis: Moderate to severe by echo in 4/14. Bioprosthetic #21 [facility] Ease aortic valve replacement in 4/14.  11. CAD: LHC (4/14) with 95% pLAD, 70-80% ostial ramus, 70% mLCx, 90% pRCA, EF 35%. CABG 4/14 with LIMA-LAD, seq SVG-ramus and OM1, SVG-PDA.  12. Carotid stenosis: Carotid dopplers (0000000) with A999333 LICA stenosis.  Carotids (123456) with 123456 LICA stenosis.  13. AAA: 5/14 CT showed > 6 cm AAA, also right iliac aneurysm. Not stent graft candidate.  7/14 CT showed 7 cm AAA.  14. CKD: AKI in 7/14 from contrast  nephropathy.  15. Contrast allergy.  16. ABIs 9/14 were normal.  17. Gout  SH: Quit smoking in 1/14, married, no ETOH, lives in [place].   FH: No premature CAD  ROS: All systems reviewed and negative except as per HPI.    Current Outpatient Prescriptions  Medication Sig Dispense Refill  . Acetaminophen (TYLENOL PO) Take 325 mg by mouth as needed.      Marland Kitchen allopurinol (ZYLOPRIM) 100 MG  tablet Take 300 mg by mouth daily.      Marland Kitchen aspirin EC 325 MG EC tablet Take 1 tablet (325 mg total) by mouth daily.  30 tablet    . atorvastatin (LIPITOR) 80 MG tablet Take 1 tablet (80 mg total) by mouth daily.  30 tablet  6  . bisoprolol (ZEBETA) 5 MG tablet TAKE 1/2 TABLET BY MOUTH AT BEDTIME  15 tablet  3  . budesonide (PULMICORT) 0.25 MG/2ML nebulizer solution Take 2 mLs (0.25 mg total) by nebulization 2 (two) times daily. Dx 496  120 mL  12  . clonazePAM (KLONOPIN) 0.5 MG tablet Take 1 tablet (0.5 mg total) by mouth 2 (two) times daily as needed (anxiety).  60 tablet  3  . furosemide (LASIX) 80 MG tablet patient takes 3 tabs daily instead of  4 tabs daily. 1.5 tabs twice daily.      Marland Kitchen guaifenesin (ROBITUSSIN) 100 MG/5ML syrup Take 300 mg by mouth 4 (four) times daily as needed for cough.      Marland Kitchen ipratropium-albuterol (DUONEB) 0.5-2.5 (3) MG/3ML SOLN Take 3 mLs by nebulization every 6 (six) hours as needed.       . metolazone (ZAROXOLYN) 2.5 MG tablet Take 2.5 mg 30 min prior to Lasix on Wed. 12/3 and Sat. 12/6 and then every Wednesday.  30 tablet  3  . nitroGLYCERIN (NITROSTAT) 0.4 MG SL tablet Place 1 tablet (0.4 mg total) under the tongue every 5 (five) minutes as needed for chest pain.  25 tablet  12  . pantoprazole (PROTONIX) 40 MG tablet Take 1 tablet by mouth daily.      . potassium chloride (KLOR-CON 10) 10 MEQ tablet Take 2 tablets (20 mEq total) by mouth 2 (two) times daily.  120 tablet  6  . sitaGLIPtin (JANUVIA) 100 MG tablet Take 100 mg by mouth daily.      . tamsulosin (FLOMAX) 0.4 MG CAPS capsule 0.4 mg daily.        No current facility-administered medications for this visit.     PHYSICAL EXAM: Filed Vitals:   06/12/13 1134  BP: 110/72  Pulse: 88  Height: 5\' 5"  (1.651 m)  Weight: 101.152 kg (223 lb)    General:  NAD.  HEENT: normal Neck: Thick, JVP 7. Carotids 2+ bilaterally; no bruits. No lymphadenopathy or thryomegaly appreciated. Cor: PMI normal. Regular rate &  rhythm. No rubs, gallops. 1/6 SEM RUSB.  Lungs: Slight crackles at bases.  Abdomen: soft, nontender, nondistended. No hepatosplenomegaly. No bruits or masses. Good bowel sounds. Extremities: no cyanosis, clubbing, rash, Trace ankle edema. Neuro: alert & orientedx3, cranial nerves grossly intact. Moves all 4 extremities w/o difficulty. Affect pleasant.  ASSESSMENT & PLAN: 1. CAD: Status post CABG for 3VD. No chest pain. - Continue ASA, statin, and bisoprolol.  2. Chronic diastolic CHF: EF around [id number] prior to surgery, appeared to improve post-op with last echo showing EF around 50%. Stable NYHA class III symptoms.  He is on high dose Lasix and does not appear significantly volume overloaded.  - Continue Lasix  120 mg bid.   - Continue metolazone 2.5 mg once weekly on Wednesdays.  - BMET/BNP 3. 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 I suspect that intrinsic lung disease (post-infectious pulmonary fibrosis) is a contributor to his dyspnea and hypoxemia. He is actually now off home oxygen (just using CPAP at night).  4. Carotid stenosis: Followed at [facility]. 5. Hyperlipidemia: Continue statin.   6. AAA: 7 cm on last imaging with right iliac aneurysm. He is not a candidate for stent grafting. He will see Dr. Kellie Simmering soon.  I think he is as stable as he will get for surgery. 7. Status post bioprosthetic AVR: well-seated valve 5/14.   Followup in 1 month.   Loralie Champagne 06/12/2013

## 2013-06-13 ENCOUNTER — Telehealth: Payer: Self-pay | Admitting: Cardiology

## 2013-06-13 DIAGNOSIS — E876 Hypokalemia: Secondary | ICD-10-CM

## 2013-06-13 NOTE — Telephone Encounter (Signed)
Reviewed labs with patient.  Instructed him to increase KCl to 40mg  twice a day Instructed to have labs drawn again in 2 weeks. He will go to [facility] in [place] on 12/26. I will mail him a script. Pt verbalizes understanding and agreement.

## 2013-06-13 NOTE — Telephone Encounter (Signed)
Message copied by Rodman Key on Fri Jun 13, 2013  9:12 AM ------      Message from: Larey Dresser      Created: Thu Jun 12, 2013  4:11 PM       Increase KCl to 40 bid, repeat BMET in 2 wks. ------

## 2013-06-13 NOTE — Telephone Encounter (Signed)
New message    Returned the nurses call from late yesterday

## 2013-06-16 ENCOUNTER — Encounter: Payer: Self-pay | Admitting: Vascular Surgery

## 2013-06-17 ENCOUNTER — Encounter: Payer: Self-pay | Admitting: *Deleted

## 2013-06-17 ENCOUNTER — Encounter (HOSPITAL_COMMUNITY): Payer: Medicare Other

## 2013-06-17 ENCOUNTER — Ambulatory Visit (INDEPENDENT_AMBULATORY_CARE_PROVIDER_SITE_OTHER): Payer: Medicare Other | Admitting: Vascular Surgery

## 2013-06-17 ENCOUNTER — Ambulatory Visit (INDEPENDENT_AMBULATORY_CARE_PROVIDER_SITE_OTHER): Payer: Medicare Other | Admitting: Adult Health

## 2013-06-17 ENCOUNTER — Encounter: Payer: Self-pay | Admitting: Vascular Surgery

## 2013-06-17 ENCOUNTER — Encounter: Payer: Self-pay | Admitting: Adult Health

## 2013-06-17 ENCOUNTER — Ambulatory Visit (HOSPITAL_COMMUNITY)
Admission: RE | Admit: 2013-06-17 | Discharge: 2013-06-17 | Disposition: A | Discharge status: Home / Self Care | Payer: Medicare Other | Source: Ambulatory Visit | Attending: Vascular Surgery | Admitting: Vascular Surgery

## 2013-06-17 ENCOUNTER — Other Ambulatory Visit: Payer: Self-pay | Admitting: *Deleted

## 2013-06-17 VITALS — BP 110/70 | HR 98 | Temp 97.9°F | Ht 65.0 in | Wt 227.4 lb

## 2013-06-17 VITALS — BP 131/84 | HR 94 | Resp 20 | Ht 65.5 in | Wt 224.0 lb

## 2013-06-17 DIAGNOSIS — Z48812 Encounter for surgical aftercare following surgery on the circulatory system: Secondary | ICD-10-CM

## 2013-06-17 DIAGNOSIS — I714 Abdominal aortic aneurysm, without rupture, unspecified: Secondary | ICD-10-CM | POA: Insufficient documentation

## 2013-06-17 DIAGNOSIS — J449 Chronic obstructive pulmonary disease, unspecified: Secondary | ICD-10-CM

## 2013-06-17 DIAGNOSIS — G4733 Obstructive sleep apnea (adult) (pediatric): Secondary | ICD-10-CM

## 2013-06-17 NOTE — Progress Notes (Signed)
Subjective:     Patient ID: [name], male   DOB: 1940-09-21, 72 y.o.   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 He underwent coronary artery bypass grafting and pericardial aortic valve replacement by Dr. Roxan Hockey April 24 vein. He has been slowly recuperating and now is back to his preoperative strength level. He saw Dr. Loralie Champagne last week who agrees that he is a stable cardiac-wise as he will be he denies any abdominal or back pain. He is ambulating on a treadmill and walks up to a few blocks.  Past Medical History  Diagnosis Date  . Hypertension   . Hyperlipidemia   . OSA (obstructive sleep apnea)     on cpap  . Prediabetes   . CHF (congestive heart failure) 07/2012; 10/17/2012  . COPD (chronic obstructive pulmonary disease)     a.  History of heavy smoking. PFTs (4/14) with mixed obstructive (COPD) and restrictive (post-ARDS) picture.   . Pneumonia 07/2012    PNA (H1N1 influenza + pneumococcus) in [id number] complicated by respiratory failure/ARDS. Required tracheostomy, now weaned off. Had left empyema requiring chest tube.   . Diabetes mellitus without complication   . History of blood transfusion     "lots since January" (10/17/2012)  . Anxiety   . Ischemic cardiomyopathy   . Anemia of chronic disease   . Prostate cancer      s/p radiation treatment. Has indwelling foley.   . Aortic stenosis     Moderate to severe by echo in 4/14. Bioprosthetic #21 [facility] aortic valve replacement in 4/14.   Marland Kitchen CAD (coronary artery disease)     a. 4/14 CABG: LIMA-LAD, seq SVG-ramus & OM1, SVG-PDA  . Carotid arterial disease     Carotid dopplers (0000000) with [id number] LICA stenosis.   Marland Kitchen AAA (abdominal aortic aneurysm)     5/14 CT showed > 6 cm AAA, also right iliac aneurysm. Not stent graft candidate.   . Esophageal reflux   . Ischemic cardiomyopathy     a. 4/14 LHC:  pLAD 95, ost ramus 70-80, mLCx 90, EF 30%  . Chronic systolic heart failure     a. 1/14 ECHO: sev dil LV, EF30-35%, diff HK, mild MR, AS severe, AV grad 35 b. 8/14 ECHO: EF 55-60%, mild biopros AV sten mn grad. 25, RV mild dil, RA mild dil    History  Substance Use Topics  . Smoking status: Former Smoker -- 2.00 packs/day for 58 years    Types: Cigarettes    Quit date: 07/20/2012  . Smokeless tobacco: Never Used  . Alcohol Use: No     Comment: 10/17/2012 "years since I've had a drink; never had problem with it"    Family History  Problem Relation Age of Onset  . Congestive Heart Failure    . Heart disease      Allergies  Allergen Reactions  . Omnipaque [Iohexol] Other (See Comments)    Decreased kidney function  . Cephalosporins Rash    Blisters    Current outpatient prescriptions:Acetaminophen (TYLENOL PO), Take 325 mg by mouth as needed., Disp: , Rfl: ;  allopurinol (ZYLOPRIM) 100 MG tablet, Take 300 mg by mouth daily., Disp: , Rfl: ;  aspirin EC 325 MG EC tablet, Take 1 tablet (325 mg total) by mouth daily., Disp: 30 tablet, Rfl: ;  atorvastatin (LIPITOR) 80 MG tablet, Take 1 tablet (80 mg total) by  mouth daily., Disp: 30 tablet, Rfl: 6 bisoprolol (ZEBETA) 5 MG tablet, TAKE 1/2 TABLET BY MOUTH AT BEDTIME, Disp: 15 tablet, Rfl: 3;  budesonide (PULMICORT) 0.25 MG/2ML nebulizer solution, Take 2 mLs (0.25 mg total) by nebulization 2 (two) times daily. Dx 496, Disp: 120 mL, Rfl: 12;  clonazePAM (KLONOPIN) 0.5 MG tablet, Take 1 tablet (0.5 mg total) by mouth 2 (two) times daily as needed (anxiety)., Disp: 60 tablet, Rfl: 3 furosemide (LASIX) 80 MG tablet, patient takes 3 tabs daily instead of  4 tabs daily. 1.5 tabs twice daily., Disp: , Rfl: ;  guaifenesin (ROBITUSSIN) 100 MG/5ML syrup, Take 300 mg by mouth 4 (four) times daily as needed for cough., Disp: , Rfl: ;  ipratropium-albuterol (DUONEB) 0.5-2.5 (3) MG/3ML SOLN, Take 3 mLs by nebulization every 6 (six) hours as needed. , Disp: ,  Rfl:  metolazone (ZAROXOLYN) 2.5 MG tablet, Take 2.5 mg 30 min prior to Lasix on Wed. 12/3 and Sat. 12/6 and then every Wednesday., Disp: 30 tablet, Rfl: 3;  nitroGLYCERIN (NITROSTAT) 0.4 MG SL tablet, Place 1 tablet (0.4 mg total) under the tongue every 5 (five) minutes as needed for chest pain., Disp: 25 tablet, Rfl: 12;  pantoprazole (PROTONIX) 40 MG tablet, Take 1 tablet by mouth daily., Disp: , Rfl:  potassium chloride (KLOR-CON 10) 10 MEQ tablet, Take 4 tablets (40 mEq total) by mouth 2 (two) times daily., Disp: , Rfl: ;  sitaGLIPtin (JANUVIA) 100 MG tablet, Take 100 mg by mouth daily., Disp: , Rfl: ;  tamsulosin (FLOMAX) 0.4 MG CAPS capsule, 0.4 mg daily. , Disp: , Rfl:   BP 131/84  Pulse 94  Resp 20  Ht 5' 5.5" (1.664 m)  Wt 224 lb (101.606 kg)  BMI 36.70 kg/m2  Body mass index is 36.7 kg/(m^2).          Review of Systems denies active chest pain but does have dyspnea on exertion. Has leg discomfort with walking, productive cough, generalized weakness. All other systems negative and complete review of systems    Objective:   Physical Exam BP 131/84  Pulse 94  Resp 20  Ht 5' 5.5" (1.664 m)  Wt 224 lb (101.606 kg)  BMI 36.70 kg/m2  Gen.-alert and oriented x3 in no apparent distress HEENT normal for age Lungs no rhonchi or wheezing Cardiovascular regular rhythm no murmurs carotid pulses 3+ palpable no bruits audible Abdomen soft nontender no palpable masses-obese  Musculoskeletal free of  major deformities Skin clear -no rashes Neurologic normal Lower extremities 3+ femoral and posterior tibial pulses palpable bilaterally with no edema  Today I ordered a duplex scan of his abdominal aorta which are reviewed and interpreted. The aneurysm continues to be 7.4 cm in maximum diameter similar to previous measurement in July of 2014       Assessment:     7.4 cm infrarenal abdominal aortic aneurysm with large right common iliac artery aneurysm Status post coronary bypass  grafting and aortic valve replacement with pericardial valve in April 2014 now recovered from surgery COPD with remote history of smoking    Plan:     Plan resection and grafting of abdominal aortic aneurysm and right common iliac artery aneurysm with insertion aorto to right external iliac and left common iliac bypass on Friday, January 9 Risks and benefits thoroughly discussed with patient and his family with potential complications including infection, bleeding, pneumonia, respiratory failure, and death all discussed and patient would like to proceed

## 2013-06-17 NOTE — Assessment & Plan Note (Signed)
Obstructive sleep apnea Patient encouraged on weight loss. Patient's continue on CPAP at bedtime.

## 2013-06-17 NOTE — Progress Notes (Signed)
Subjective:    Patient ID: [name], male    DOB: [birth year], 72 y.o.   MRN: [id number]  HPI   72 yo ex-heavy smoker with history of ARDS/respiratory failure and tracheostomy in 07/2012 as well as DM, HTN, and COPD on home O2 2L/m   He has an extensive hx with prolonged hospitalization in 1/14 with H1N1 influenza and pneumococcal PNA/ ARDS requiring tracheostomy. He also had a left empyema requiring chest tube, septic shock with elevated troponin, and AKI which resolved. He was admitted again in 4/14 from his nursing home with exertional dyspnea and orthopnea. Echo then showed EF 35% with global hypokinesis that looked worse in the anteroseptal wall. He had aortic stenosis - moderate to severe & severe 3 vessel disease on cath. He then had CABG-AVR (bioprosthetic valve). He was discharged on home O2.  Echo in 5/14 prior to discharge showed EF up to 50%.   He was admitted 12/20/12 dyspnea, desaturation at 89% and weight gain up 17 lbs. CXR showed 6/19 Mild vascular congestion and small pleural effusions, unchanged from 12/03/12, pBNP 2056.  He required aggressive diuresis with up to 7 L removed.   Admitted 7/4 through 01/05/13 with hyperkalemia 6.5 and elevated creatinine 4.7 thought to be from contrast given with CTA abdomen/pelvis. Coreg, hydralazine, IMDUR, Torsemide were stopped. He was started on 160 mg of lasix twice a day. Discharge weight was 207 pounds  He also uses CPAP during sleep & is compliant.  He smoked 2-3 PPD prior to his admission in Jan but has quit since.  10/23/12 PFTs reveal severe obstructive / restrictive disease & severely reduced DLCO.   PFT 01/21/13 >FEV1 58%, ratio 58, +25% change after BD , DLCO 38 .   He has seen Dr. Kellie Simmering for 7 cm AAA on CT angio 12/2012  with right iliac aneurysm -not a candidate for stent grafting or surgery  03/20/13  Pt reports his breathing has been doing well.  Taken out of rehab since [place] [facility] -He is exercising and walks a mile on the  treadmill. Denies any wheezing and no chest tx. Occasional cough. He uses the 3 liters O2 PRN. ([facility]) He does use his CPAP everynight x 7-8 hrs a night. ([facility]) Uses nebs tid - questions need He did not desatn on walking 3 laps in the office >>trial of Breo   06/17/2013 Follow up  Patient returns for a three-month followup for COPD, and obstructive sleep apnea Pt reports his breathing has been doing good. he has not been having to use O2. He is scheduled to have surgery 07/11/13. c/o occasion cough-productive at times. Denies any wheezing, no chest tx Patient wears CPAP every night for at least 6 hours. Says he feels rested the next day. Patient continues on budesonide nebulizer twice daily. He uses albuterol and Atrovent nebulizer 3 times a day. He was given a trial of Breo last ov, he says that he did not notice much difference and was unable to afford this inhaler. Patient denies any chest pain, hemoptysis, orthopnea, or increased leg swelling. Patient does tell me that he is going for her aneurysm repair next month with Dr. Kellie Simmering Patient has an abdominal aortic aneurysm and right common iliac artery aneurysm.   Review of Systems  neg for any significant sore throat, dysphagia, itching, sneezing, nasal congestion or excess/ purulent secretions, fever, chills, sweats, unintended wt loss, pleuritic or exertional cp, hempoptysis, orthopnea pnd or change in chronic leg swelling. Also denies presyncope,  palpitations, heartburn, abdominal pain, nausea, vomiting, diarrhea or change in bowel or urinary habits, dysuria,hematuria, rash, arthralgias, visual complaints, headache, numbness weakness or ataxia.     Objective:   Physical Exam   Gen. Pleasant, well-nourished, in no distress, normal affect ENT - no lesions, no post nasal drip Neck: No JVD, no thyromegaly, no carotid bruits Lungs: no use of accessory muscles, no dullness to percussion, decreased without rales or rhonchi   Cardiovascular: Rhythm regular, heart sounds  normal, no murmurs or gallops, no peripheral edema Abdomen: soft and non-tender, no hepatosplenomegaly, BS normal. Musculoskeletal: No deformities, no cyanosis or clubbing Neuro:  alert, non focal       Assessment & Plan:

## 2013-06-17 NOTE — Assessment & Plan Note (Signed)
COPD compensated on present regimen. Flu shot up-to-date.  Plan Continue on current regimen Follow up Dr. Elsworth Soho in 3 months

## 2013-06-17 NOTE — Patient Instructions (Signed)
Continue on Budesonide Neb Twice daily   Continue on Ipratropium /Albuterol Neb Four times a day   Good luck on upcoming surgery .  Continue with CPAP At bedtime  .  Take CPAP machine /mask to hospital for surgery .  Follow up Dr. Elsworth Soho  In 3 months and As needed

## 2013-06-18 ENCOUNTER — Encounter: Payer: Self-pay | Admitting: *Deleted

## 2013-06-27 ENCOUNTER — Encounter: Payer: Self-pay | Admitting: Cardiology

## 2013-07-02 ENCOUNTER — Other Ambulatory Visit (HOSPITAL_COMMUNITY): Payer: Self-pay | Admitting: *Deleted

## 2013-07-02 NOTE — Pre-Procedure Instructions (Signed)
[name]  07/02/2013   Your procedure is scheduled on:  Thursday, July 10, 2013 at 7:30 AM.   Report to [facility] Entrance "A" Admitting Office at 5:30 AM.   Call this number if you have problems the morning of surgery: [phone]   Remember:   Do not eat food or drink liquids after midnight Wednesday, 07/09/13.   Take these medicines the morning of surgery with A SIP OF WATER: allopurinol (ZYLOPRIM), pantoprazole (PROTONIX), tamsulosin (FLOMAX), nitroGLYCERIN (NITROSTAT) - if needed, clonazePAM (KLONOPIN) - if needed, Acetaminophen (TYLENOL PO) - if needed.      Do not wear jewelry.  Do not wear lotions, powders, or cologne. You may wear deodorant.  Men may shave face and neck.  Do not bring valuables to the hospital.  [facility] is not responsible                  for any belongings or valuables.               Contacts, dentures or bridgework may not be worn into surgery.  Leave suitcase in the car. After surgery it may be brought to your room.  For patients admitted to the hospital, discharge time is determined by your                treatment team.               Special Instructions: Shower using CHG 2 nights before surgery and the night before surgery.  If you shower the day of surgery use CHG.  Use special wash - you have one bottle of CHG for all showers.  You should use approximately 1/3 of the bottle for each shower.   Please read over the following fact sheets that you were given: Pain Booklet, Coughing and Deep Breathing, Blood Transfusion Information, MRSA Information and Surgical Site Infection Prevention

## 2013-07-04 ENCOUNTER — Other Ambulatory Visit: Payer: Self-pay | Admitting: *Deleted

## 2013-07-04 ENCOUNTER — Encounter (HOSPITAL_COMMUNITY)
Admission: RE | Admit: 2013-07-04 | Discharge: 2013-07-04 | Disposition: A | Discharge status: Home / Self Care | Payer: Medicare Other | Source: Ambulatory Visit | Attending: Vascular Surgery | Admitting: Vascular Surgery

## 2013-07-04 ENCOUNTER — Encounter (HOSPITAL_COMMUNITY): Payer: Self-pay

## 2013-07-04 DIAGNOSIS — Z01818 Encounter for other preprocedural examination: Secondary | ICD-10-CM | POA: Insufficient documentation

## 2013-07-04 DIAGNOSIS — Z01812 Encounter for preprocedural laboratory examination: Secondary | ICD-10-CM | POA: Insufficient documentation

## 2013-07-04 DIAGNOSIS — Z22322 Carrier or suspected carrier of Methicillin resistant Staphylococcus aureus: Secondary | ICD-10-CM

## 2013-07-04 HISTORY — DX: Adverse effect of unspecified anesthetic, initial encounter: T41.45XA

## 2013-07-04 HISTORY — DX: Other complications of anesthesia, initial encounter: T88.59XA

## 2013-07-04 HISTORY — DX: Chronic kidney disease, unspecified: N18.9

## 2013-07-04 HISTORY — DX: Unspecified osteoarthritis, unspecified site: M19.90

## 2013-07-04 LAB — URINALYSIS, ROUTINE W REFLEX MICROSCOPIC
Bilirubin Urine: NEGATIVE
Glucose, UA: NEGATIVE mg/dL
Hgb urine dipstick: NEGATIVE
Ketones, ur: NEGATIVE mg/dL
NITRITE: NEGATIVE
PROTEIN: NEGATIVE mg/dL
Specific Gravity, Urine: 1.008 (ref 1.005–1.030)
UROBILINOGEN UA: 0.2 mg/dL (ref 0.0–1.0)
pH: 5.5 (ref 5.0–8.0)

## 2013-07-04 LAB — COMPREHENSIVE METABOLIC PANEL
ALBUMIN: 4 g/dL (ref 3.5–5.2)
ALK PHOS: 123 U/L — AB (ref 39–117)
ALT: 28 U/L (ref 0–53)
AST: 25 U/L (ref 0–37)
BUN: 41 mg/dL — ABNORMAL HIGH (ref 6–23)
CO2: 22 mEq/L (ref 19–32)
Calcium: 9.3 mg/dL (ref 8.4–10.5)
Chloride: 99 mEq/L (ref 96–112)
Creatinine, Ser: 1.47 mg/dL — ABNORMAL HIGH (ref 0.50–1.35)
GFR calc Af Amer: 54 mL/min — ABNORMAL LOW (ref >90)
GFR calc non Af Amer: 46 mL/min — ABNORMAL LOW (ref >90)
GLUCOSE: 160 mg/dL — AB (ref 70–99)
POTASSIUM: 3.9 meq/L (ref 3.7–5.3)
SODIUM: 140 meq/L (ref 137–147)
Total Bilirubin: 0.8 mg/dL (ref 0.3–1.2)
Total Protein: 7.8 g/dL (ref 6.0–8.3)

## 2013-07-04 LAB — BLOOD GAS, ARTERIAL
Acid-Base Excess: 2.4 mmol/L — ABNORMAL HIGH (ref 0.0–2.0)
BICARBONATE: 26.6 meq/L — AB (ref 20.0–24.0)
Drawn by: 181601
FIO2: 0.21 %
O2 Saturation: 95.2 %
PO2 ART: 72.1 mmHg — AB (ref 80.0–100.0)
Patient temperature: 98.6
TCO2: 27.9 mmol/L (ref 0–100)
pCO2 arterial: 42.1 mmHg (ref 35.0–45.0)
pH, Arterial: 7.417 (ref 7.350–7.450)

## 2013-07-04 LAB — URINE MICROSCOPIC-ADD ON

## 2013-07-04 LAB — PROTIME-INR
INR: 1.04 (ref 0.00–1.49)
PROTHROMBIN TIME: 13.4 s (ref 11.6–15.2)

## 2013-07-04 LAB — CBC
HEMATOCRIT: 42.6 % (ref 39.0–52.0)
HEMOGLOBIN: 13.4 g/dL (ref 13.0–17.0)
MCH: 28.5 pg (ref 26.0–34.0)
MCHC: 31.5 g/dL (ref 30.0–36.0)
MCV: 90.4 fL (ref 78.0–100.0)
Platelets: 143 10*3/uL — ABNORMAL LOW (ref 150–400)
RBC: 4.71 MIL/uL (ref 4.22–5.81)
RDW: 15.7 % — ABNORMAL HIGH (ref 11.5–15.5)
WBC: 6.9 10*3/uL (ref 4.0–10.5)

## 2013-07-04 LAB — PREPARE RBC (CROSSMATCH)

## 2013-07-04 LAB — SURGICAL PCR SCREEN
MRSA, PCR: POSITIVE — AB
Staphylococcus aureus: POSITIVE — AB

## 2013-07-04 LAB — APTT: aPTT: 29 seconds (ref 24–37)

## 2013-07-04 MED ORDER — MUPIROCIN 2 % EX OINT: TOPICAL_OINTMENT | CUTANEOUS | Status: DC

## 2013-07-04 NOTE — Progress Notes (Signed)
Left voicemsg. For Zigmund Daniel at Dr. Evelena Leyden office to call ssc, [phone] for positive swab result.

## 2013-07-04 NOTE — Progress Notes (Signed)
Call to A. Zelenak,PAC, reviewed pt. complicated history.

## 2013-07-07 MED ORDER — IPRATROPIUM-ALBUTEROL 0.5-2.5 (3) MG/3ML IN SOLN: 3.0000 mL | Freq: Four times a day (QID) | RESPIRATORY_TRACT | Status: DC | PRN

## 2013-07-07 MED ORDER — POTASSIUM CHLORIDE ER 10 MEQ PO TBCR: 40.0000 meq | EXTENDED_RELEASE_TABLET | Freq: Two times a day (BID) | ORAL | Status: DC

## 2013-07-07 NOTE — Addendum Note (Signed)
Addended by: Horatio Pel on: 07/07/2013 10:03 AM   Modules accepted: Orders

## 2013-07-07 NOTE — Progress Notes (Signed)
Anesthesia chart review:  Patient is a 73 year old male scheduled for open repair of a 7 cm AAA and 4.9 cm right CIA aneurysm on 07/10/13 by Dr. Kellie Simmering.    History includes former smoker, severe AS/CAD s/p CABG (LIMA to LAD, SVG to RAMUS INT and OM1, SVG to PDA)/AVR (bioprosthetic) 10/25/12 (Dr. Roxan Hockey), ischemic cardiomyopathy with chronic systolic CHF with EF improved at 55-60% by 01/2013 echo, HTN, HLD, COPD, H1N1 influenza with pneumococcal PNA with left empyema s/p chest tube and requiring tracheostomy 07/2012 (now decannulated), DM2, obesity, anxiety, CKD, OSA with CPAP use, anemia of chronic disease, arthritis.  He is now off supplemental O2 and is just using CPAP at night. Pulmonologist is Dr. Kara Mead with last visit with Rexene Edison, NP on 06/17/13 with note stating, "Patient does tell me that he is going for her aneurysm repair next month with Dr. Kellie Simmering."  Surgery had previously been postponed due to deconditioning following his prolonged hospitalization for PNA 07/2012 and CABG/AVR 10/2012. PCP is Dr. [name].  Cardiologist is Dr. Loralie Champagne, last visit 06/12/13.  He felt patient "is as stable as he will get for surgery."  For anesthesia history he reported having to get medication due to one of his vitals dropping during surgery (CABG/AVR).  Those anesthesia records are in [place] for review as needed.  Echo on 02/03/13 showed: Normal LV size with mild LV hypertrophy, EF 55-60%. Mildly dilated RV with normal systolic function. Borderline elevated pulmonary artery pressure. Bioprosthetic aortic valve with mildly elevated mean gradient, possibly due to a degree of patient-prosthesis mismatch. Septal bounce most likely due to prior cardiac surgery, less likely constrictive pericarditis given non-dilated IVC. Trivial TR. (Dr. Loralie Champagne)  EKG on 01/08/13 showed ST @ 102 bpm, possible LAE, right BBB.  His last cardiac cath was pre-CABG/AVR on 10/22/12 and showed severe 3V CAD with EF 35%  by echo.  Carotid duplex on 03/04/13 showed < 40% RICA stenosis, [id number] LICA stenosis.  CXR on 07/04/13 showed: FINDINGS: The cardiac shadow is stable. Postsurgical changes are again noted. Mild interstitial changes are again seen and stable. No focal infiltrate or sizable effusion is noted. IMPRESSION: No acute abnormality noted.  PFT 01/21/13 showed FVC 2.62 (73%), FEV1 1.53 (58%), FEV1/FVC ratio 58, +25% change after BD , DLCO 38%.   Preoperative labs noted. Cr is 1.47, down from 1.7 on 06/12/13 (Cr 1.40 - 2.2 since 03/14/13). H/H 13.4/42.6, PLT count 143K.  PT/PTT WNL. UA moderate leukocytes, but negative nitrites. UA routed to Dr. Kellie Simmering for review.  Patient with prolonged hospitalization for PNA 07/2012 and later CABG/AVR in 10/2012.  AAA repair was postponed until now to allow for deconditioning to improve.  Both pulmonary and cardiology are aware of plans for surgery.  He is now off supplemental oxygen and cardiology feels he is as stable as he can get for surgery.  He will be further evaluated by his assigned anesthesiologist on the day of surgery.  If no acute changes in his cardiopulmonary status then I would anticipate that he could proceed as planned.  George Hugh [facility] Short Stay Center/Anesthesiology Phone [phone] 07/07/2013 2:57 PM

## 2013-07-07 NOTE — Telephone Encounter (Signed)
Advised patient of lab results.  Patient requested new Rx for K+ be sent to [facility], done.

## 2013-07-07 NOTE — Telephone Encounter (Signed)
Message copied by Earvin Hansen on Mon Jul 07, 2013 11:45 AM ------      Message from: Larey Dresser      Created: Mon Jul 07, 2013 10:35 AM       OK no changes ------

## 2013-07-09 ENCOUNTER — Encounter (HOSPITAL_COMMUNITY): Payer: Self-pay | Admitting: Certified Registered Nurse Anesthetist

## 2013-07-09 MED ORDER — VANCOMYCIN HCL 10 G IV SOLR
1500.0000 mg | INTRAVENOUS | Status: AC
  Administered 2013-07-10: 1500 mg via INTRAVENOUS
  Filled 2013-07-09: qty 1500

## 2013-07-10 ENCOUNTER — Inpatient Hospital Stay (HOSPITAL_COMMUNITY): Payer: Medicare Other

## 2013-07-10 ENCOUNTER — Encounter (HOSPITAL_COMMUNITY)
Admission: RE | Disposition: A | Discharge status: Skilled Nursing Facility | Payer: Medicare Other | Source: Ambulatory Visit | Attending: Vascular Surgery

## 2013-07-10 ENCOUNTER — Encounter (HOSPITAL_COMMUNITY): Payer: Self-pay | Admitting: *Deleted

## 2013-07-10 ENCOUNTER — Inpatient Hospital Stay (HOSPITAL_COMMUNITY): Payer: Medicare Other | Admitting: Anesthesiology

## 2013-07-10 ENCOUNTER — Inpatient Hospital Stay (HOSPITAL_COMMUNITY)
Admission: RE | Admit: 2013-07-10 | Discharge: 2013-07-25 | DRG: 237 | Disposition: A | Discharge status: Skilled Nursing Facility | Payer: Medicare Other | Source: Ambulatory Visit | Attending: Vascular Surgery | Admitting: Vascular Surgery

## 2013-07-10 ENCOUNTER — Encounter (HOSPITAL_COMMUNITY): Payer: Medicare Other | Admitting: Vascular Surgery

## 2013-07-10 DIAGNOSIS — Z91041 Radiographic dye allergy status: Secondary | ICD-10-CM

## 2013-07-10 DIAGNOSIS — J9819 Other pulmonary collapse: Secondary | ICD-10-CM | POA: Diagnosis not present

## 2013-07-10 DIAGNOSIS — I5022 Chronic systolic (congestive) heart failure: Secondary | ICD-10-CM | POA: Diagnosis present

## 2013-07-10 DIAGNOSIS — K219 Gastro-esophageal reflux disease without esophagitis: Secondary | ICD-10-CM | POA: Diagnosis present

## 2013-07-10 DIAGNOSIS — R0902 Hypoxemia: Secondary | ICD-10-CM

## 2013-07-10 DIAGNOSIS — R570 Cardiogenic shock: Secondary | ICD-10-CM

## 2013-07-10 DIAGNOSIS — E119 Type 2 diabetes mellitus without complications: Secondary | ICD-10-CM

## 2013-07-10 DIAGNOSIS — K929 Disease of digestive system, unspecified: Secondary | ICD-10-CM | POA: Diagnosis not present

## 2013-07-10 DIAGNOSIS — T368X5A Adverse effect of other systemic antibiotics, initial encounter: Secondary | ICD-10-CM | POA: Diagnosis not present

## 2013-07-10 DIAGNOSIS — N289 Disorder of kidney and ureter, unspecified: Secondary | ICD-10-CM

## 2013-07-10 DIAGNOSIS — I714 Abdominal aortic aneurysm, without rupture, unspecified: Principal | ICD-10-CM

## 2013-07-10 DIAGNOSIS — Z2239 Carrier of other specified bacterial diseases: Secondary | ICD-10-CM

## 2013-07-10 DIAGNOSIS — Z881 Allergy status to other antibiotic agents status: Secondary | ICD-10-CM

## 2013-07-10 DIAGNOSIS — J9600 Acute respiratory failure, unspecified whether with hypoxia or hypercapnia: Secondary | ICD-10-CM | POA: Diagnosis not present

## 2013-07-10 DIAGNOSIS — Y832 Surgical operation with anastomosis, bypass or graft as the cause of abnormal reaction of the patient, or of later complication, without mention of misadventure at the time of the procedure: Secondary | ICD-10-CM | POA: Diagnosis not present

## 2013-07-10 DIAGNOSIS — J449 Chronic obstructive pulmonary disease, unspecified: Secondary | ICD-10-CM

## 2013-07-10 DIAGNOSIS — J151 Pneumonia due to Pseudomonas: Secondary | ICD-10-CM

## 2013-07-10 DIAGNOSIS — I723 Aneurysm of iliac artery: Secondary | ICD-10-CM | POA: Diagnosis present

## 2013-07-10 DIAGNOSIS — Z8546 Personal history of malignant neoplasm of prostate: Secondary | ICD-10-CM

## 2013-07-10 DIAGNOSIS — I739 Peripheral vascular disease, unspecified: Secondary | ICD-10-CM | POA: Diagnosis present

## 2013-07-10 DIAGNOSIS — J80 Acute respiratory distress syndrome: Secondary | ICD-10-CM

## 2013-07-10 DIAGNOSIS — F411 Generalized anxiety disorder: Secondary | ICD-10-CM | POA: Diagnosis present

## 2013-07-10 DIAGNOSIS — I2589 Other forms of chronic ischemic heart disease: Secondary | ICD-10-CM | POA: Diagnosis present

## 2013-07-10 DIAGNOSIS — D696 Thrombocytopenia, unspecified: Secondary | ICD-10-CM | POA: Diagnosis not present

## 2013-07-10 DIAGNOSIS — N179 Acute kidney failure, unspecified: Secondary | ICD-10-CM

## 2013-07-10 DIAGNOSIS — B952 Enterococcus as the cause of diseases classified elsewhere: Secondary | ICD-10-CM | POA: Diagnosis not present

## 2013-07-10 DIAGNOSIS — J4489 Other specified chronic obstructive pulmonary disease: Secondary | ICD-10-CM | POA: Diagnosis present

## 2013-07-10 DIAGNOSIS — I509 Heart failure, unspecified: Secondary | ICD-10-CM | POA: Diagnosis present

## 2013-07-10 DIAGNOSIS — Z87891 Personal history of nicotine dependence: Secondary | ICD-10-CM

## 2013-07-10 DIAGNOSIS — Z951 Presence of aortocoronary bypass graft: Secondary | ICD-10-CM

## 2013-07-10 DIAGNOSIS — Z7982 Long term (current) use of aspirin: Secondary | ICD-10-CM

## 2013-07-10 DIAGNOSIS — J9620 Acute and chronic respiratory failure, unspecified whether with hypoxia or hypercapnia: Secondary | ICD-10-CM

## 2013-07-10 DIAGNOSIS — I5033 Acute on chronic diastolic (congestive) heart failure: Secondary | ICD-10-CM

## 2013-07-10 DIAGNOSIS — T8110XA Postprocedural shock unspecified, initial encounter: Secondary | ICD-10-CM | POA: Diagnosis not present

## 2013-07-10 DIAGNOSIS — G9340 Encephalopathy, unspecified: Secondary | ICD-10-CM | POA: Diagnosis not present

## 2013-07-10 DIAGNOSIS — A4902 Methicillin resistant Staphylococcus aureus infection, unspecified site: Secondary | ICD-10-CM | POA: Diagnosis not present

## 2013-07-10 DIAGNOSIS — I2510 Atherosclerotic heart disease of native coronary artery without angina pectoris: Secondary | ICD-10-CM

## 2013-07-10 DIAGNOSIS — N390 Urinary tract infection, site not specified: Secondary | ICD-10-CM | POA: Diagnosis not present

## 2013-07-10 DIAGNOSIS — E785 Hyperlipidemia, unspecified: Secondary | ICD-10-CM | POA: Diagnosis present

## 2013-07-10 DIAGNOSIS — R000 Tachycardia, unspecified: Secondary | ICD-10-CM | POA: Diagnosis present

## 2013-07-10 DIAGNOSIS — K560 Paralytic ileus: Secondary | ICD-10-CM | POA: Diagnosis not present

## 2013-07-10 DIAGNOSIS — N170 Acute kidney failure with tubular necrosis: Secondary | ICD-10-CM | POA: Diagnosis not present

## 2013-07-10 DIAGNOSIS — I129 Hypertensive chronic kidney disease with stage 1 through stage 4 chronic kidney disease, or unspecified chronic kidney disease: Secondary | ICD-10-CM | POA: Diagnosis present

## 2013-07-10 DIAGNOSIS — D62 Acute posthemorrhagic anemia: Secondary | ICD-10-CM | POA: Diagnosis not present

## 2013-07-10 DIAGNOSIS — N183 Chronic kidney disease, stage 3 unspecified: Secondary | ICD-10-CM | POA: Diagnosis present

## 2013-07-10 DIAGNOSIS — L270 Generalized skin eruption due to drugs and medicaments taken internally: Secondary | ICD-10-CM

## 2013-07-10 DIAGNOSIS — Z923 Personal history of irradiation: Secondary | ICD-10-CM

## 2013-07-10 DIAGNOSIS — Z952 Presence of prosthetic heart valve: Secondary | ICD-10-CM

## 2013-07-10 DIAGNOSIS — Z79899 Other long term (current) drug therapy: Secondary | ICD-10-CM

## 2013-07-10 DIAGNOSIS — G4733 Obstructive sleep apnea (adult) (pediatric): Secondary | ICD-10-CM

## 2013-07-10 HISTORY — PX: ABDOMINAL AORTIC ANEURYSM REPAIR: SHX42

## 2013-07-10 LAB — POCT I-STAT 3, ART BLOOD GAS (G3+)
Acid-base deficit: 4 mmol/L — ABNORMAL HIGH (ref 0.0–2.0)
Acid-base deficit: 3 mmol/L — ABNORMAL HIGH (ref 0.0–2.0)
BICARBONATE: 23.8 meq/L (ref 20.0–24.0)
Bicarbonate: 24.2 mEq/L — ABNORMAL HIGH (ref 20.0–24.0)
O2 Saturation: 94 %
O2 Saturation: 97 %
PCO2 ART: 51.5 mmHg — AB (ref 35.0–45.0)
PH ART: 7.278 — AB (ref 7.350–7.450)
PO2 ART: 75 mmHg — AB (ref 80.0–100.0)
Patient temperature: 96.8
TCO2: 25 mmol/L (ref 0–100)
TCO2: 26 mmol/L (ref 0–100)
pCO2 arterial: 50.2 mmHg — ABNORMAL HIGH (ref 35.0–45.0)
pH, Arterial: 7.28 — ABNORMAL LOW (ref 7.350–7.450)
pO2, Arterial: 99 mmHg (ref 80.0–100.0)

## 2013-07-10 LAB — GLUCOSE, CAPILLARY: Glucose-Capillary: 137 mg/dL — ABNORMAL HIGH (ref 70–99)

## 2013-07-10 LAB — CBC
HCT: 43.4 % (ref 39.0–52.0)
Hemoglobin: 14.8 g/dL (ref 13.0–17.0)
MCH: 30.6 pg (ref 26.0–34.0)
MCHC: 34.1 g/dL (ref 30.0–36.0)
MCV: 89.9 fL (ref 78.0–100.0)
Platelets: 105 10*3/uL — ABNORMAL LOW (ref 150–400)
RBC: 4.83 MIL/uL (ref 4.22–5.81)
RDW: 15.4 % (ref 11.5–15.5)
WBC: 15.9 10*3/uL — ABNORMAL HIGH (ref 4.0–10.5)

## 2013-07-10 LAB — BASIC METABOLIC PANEL
BUN: 31 mg/dL — ABNORMAL HIGH (ref 6–23)
CALCIUM: 7.2 mg/dL — AB (ref 8.4–10.5)
CO2: 22 mEq/L (ref 19–32)
CREATININE: 1.71 mg/dL — AB (ref 0.50–1.35)
Chloride: 102 mEq/L (ref 96–112)
GFR calc Af Amer: 45 mL/min — ABNORMAL LOW (ref >90)
GFR calc non Af Amer: 38 mL/min — ABNORMAL LOW (ref >90)
Glucose, Bld: 285 mg/dL — ABNORMAL HIGH (ref 70–99)
Potassium: 5.4 mEq/L — ABNORMAL HIGH (ref 3.7–5.3)
Sodium: 136 mEq/L — ABNORMAL LOW (ref 137–147)

## 2013-07-10 LAB — PROTIME-INR
INR: 1.34 (ref 0.00–1.49)
PROTHROMBIN TIME: 16.3 s — AB (ref 11.6–15.2)

## 2013-07-10 LAB — MAGNESIUM: Magnesium: 1.3 mg/dL — ABNORMAL LOW (ref 1.5–2.5)

## 2013-07-10 LAB — APTT: aPTT: 33 seconds (ref 24–37)

## 2013-07-10 LAB — PREPARE RBC (CROSSMATCH)

## 2013-07-10 SURGERY — ANEURYSM ABDOMINAL AORTIC REPAIR
Anesthesia: General | Site: Abdomen

## 2013-07-10 MED ORDER — LACTATED RINGERS IV SOLN
INTRAVENOUS | Status: DC | PRN
  Administered 2013-07-10 (×3): via INTRAVENOUS

## 2013-07-10 MED ORDER — CLONAZEPAM 0.5 MG PO TABS: 0.5000 mg | ORAL_TABLET | Freq: Two times a day (BID) | ORAL | Status: DC | PRN

## 2013-07-10 MED ORDER — DEXTROSE-NACL 5-0.45 % IV SOLN
INTRAVENOUS | Status: DC
  Administered 2013-07-10 – 2013-07-11 (×2): via INTRAVENOUS

## 2013-07-10 MED ORDER — ASPIRIN 81 MG PO CHEW
324.0000 mg | CHEWABLE_TABLET | Freq: Every day | ORAL | Status: DC
  Administered 2013-07-11 – 2013-07-25 (×14): 324 mg via NASOGASTRIC
  Filled 2013-07-10 (×16): qty 4

## 2013-07-10 MED ORDER — LACTATED RINGERS IV SOLN
INTRAVENOUS | Status: DC | PRN
  Administered 2013-07-10 (×2): via INTRAVENOUS

## 2013-07-10 MED ORDER — PANTOPRAZOLE SODIUM 40 MG PO TBEC: 40.0000 mg | DELAYED_RELEASE_TABLET | Freq: Every day | ORAL | Status: DC

## 2013-07-10 MED ORDER — HEPARIN SODIUM (PORCINE) 1000 UNIT/ML IJ SOLN
INTRAMUSCULAR | Status: DC | PRN
  Administered 2013-07-10: 2000 [IU] via INTRAVENOUS
  Administered 2013-07-10: 6000 [IU] via INTRAVENOUS

## 2013-07-10 MED ORDER — PANTOPRAZOLE SODIUM 40 MG IV SOLR
40.0000 mg | Freq: Every day | INTRAVENOUS | Status: DC
  Administered 2013-07-10 – 2013-07-20 (×11): 40 mg via INTRAVENOUS
  Filled 2013-07-10 (×12): qty 40

## 2013-07-10 MED ORDER — SODIUM CHLORIDE 0.9 % IR SOLN
Status: DC | PRN
  Administered 2013-07-10: 08:00:00

## 2013-07-10 MED ORDER — PROPOFOL 10 MG/ML IV BOLUS
INTRAVENOUS | Status: DC | PRN
  Administered 2013-07-10: 50 mg via INTRAVENOUS

## 2013-07-10 MED ORDER — NITROGLYCERIN IN D5W 200-5 MCG/ML-% IV SOLN
INTRAVENOUS | Status: DC | PRN
  Administered 2013-07-10: 10 ug/kg/min via INTRAVENOUS

## 2013-07-10 MED ORDER — GUAIFENESIN-DM 100-10 MG/5ML PO SYRP: 15.0000 mL | ORAL_SOLUTION | ORAL | Status: DC | PRN

## 2013-07-10 MED ORDER — LACTATED RINGERS IV SOLN
INTRAVENOUS | Status: DC | PRN
  Administered 2013-07-10 (×2): via INTRAVENOUS

## 2013-07-10 MED ORDER — METOPROLOL TARTRATE 1 MG/ML IV SOLN: 2.0000 mg | INTRAVENOUS | Status: DC | PRN

## 2013-07-10 MED ORDER — ONDANSETRON HCL 4 MG/2ML IJ SOLN
4.0000 mg | Freq: Four times a day (QID) | INTRAMUSCULAR | Status: DC | PRN
  Administered 2013-07-15 – 2013-07-21 (×2): 4 mg via INTRAVENOUS
  Filled 2013-07-10 (×2): qty 2

## 2013-07-10 MED ORDER — MORPHINE SULFATE 2 MG/ML IJ SOLN
2.0000 mg | INTRAMUSCULAR | Status: DC | PRN
  Administered 2013-07-10: 4 mg via INTRAVENOUS
  Filled 2013-07-10: qty 2

## 2013-07-10 MED ORDER — ALUM & MAG HYDROXIDE-SIMETH 200-200-20 MG/5ML PO SUSP: 15.0000 mL | ORAL | Status: DC | PRN

## 2013-07-10 MED ORDER — FENTANYL CITRATE 0.05 MG/ML IJ SOLN
50.0000 ug | INTRAMUSCULAR | Status: DC | PRN
  Filled 2013-07-10: qty 2

## 2013-07-10 MED ORDER — NITROGLYCERIN 0.4 MG SL SUBL: 0.4000 mg | SUBLINGUAL_TABLET | SUBLINGUAL | Status: DC | PRN

## 2013-07-10 MED ORDER — PHENYLEPHRINE HCL 10 MG/ML IJ SOLN
10.0000 mg | INTRAVENOUS | Status: DC | PRN
  Administered 2013-07-10: 15 ug/min via INTRAVENOUS

## 2013-07-10 MED ORDER — VANCOMYCIN HCL IN DEXTROSE 1-5 GM/200ML-% IV SOLN
1000.0000 mg | Freq: Two times a day (BID) | INTRAVENOUS | Status: AC
  Administered 2013-07-10 – 2013-07-11 (×2): 1000 mg via INTRAVENOUS
  Filled 2013-07-10 (×2): qty 200

## 2013-07-10 MED ORDER — POTASSIUM CHLORIDE CRYS ER 20 MEQ PO TBCR: 20.0000 meq | EXTENDED_RELEASE_TABLET | Freq: Once | ORAL | Status: DC | PRN

## 2013-07-10 MED ORDER — ASPIRIN EC 325 MG PO TBEC: 325.0000 mg | DELAYED_RELEASE_TABLET | Freq: Every day | ORAL | Status: DC

## 2013-07-10 MED ORDER — MIDAZOLAM HCL 2 MG/2ML IJ SOLN
1.0000 mg | INTRAMUSCULAR | Status: DC | PRN
  Administered 2013-07-10 – 2013-07-16 (×9): 1 mg via INTRAVENOUS
  Filled 2013-07-10 (×9): qty 2

## 2013-07-10 MED ORDER — PHENOL 1.4 % MT LIQD: 1.0000 | OROMUCOSAL | Status: DC | PRN

## 2013-07-10 MED ORDER — LINAGLIPTIN 5 MG PO TABS: 5.0000 mg | ORAL_TABLET | Freq: Every day | ORAL | Status: DC

## 2013-07-10 MED ORDER — FENTANYL CITRATE 0.05 MG/ML IJ SOLN: 50.0000 ug | INTRAMUSCULAR | Status: DC | PRN

## 2013-07-10 MED ORDER — FENTANYL CITRATE 0.05 MG/ML IJ SOLN
INTRAMUSCULAR | Status: DC | PRN
  Administered 2013-07-10: 450 ug via INTRAVENOUS
  Administered 2013-07-10 (×6): 50 ug via INTRAVENOUS
  Administered 2013-07-10: 100 ug via INTRAVENOUS
  Administered 2013-07-10 (×3): 50 ug via INTRAVENOUS

## 2013-07-10 MED ORDER — DOCUSATE SODIUM 100 MG PO CAPS: 100.0000 mg | ORAL_CAPSULE | Freq: Every day | ORAL | Status: DC

## 2013-07-10 MED ORDER — HEMOSTATIC AGENTS (NO CHARGE) OPTIME
TOPICAL | Status: DC | PRN
  Administered 2013-07-10 (×4): 1 via TOPICAL

## 2013-07-10 MED ORDER — FUROSEMIDE 10 MG/ML IJ SOLN
20.0000 mg | INTRAMUSCULAR | Status: DC
  Filled 2013-07-10: qty 2

## 2013-07-10 MED ORDER — LIDOCAINE HCL (CARDIAC) 20 MG/ML IV SOLN
INTRAVENOUS | Status: DC | PRN
  Administered 2013-07-10: 100 mg via INTRAVENOUS

## 2013-07-10 MED ORDER — FUROSEMIDE 10 MG/ML IJ SOLN
INTRAMUSCULAR | Status: DC | PRN
  Administered 2013-07-10: 20 mg via INTRAMUSCULAR
  Administered 2013-07-10: 40 mg via INTRAMUSCULAR

## 2013-07-10 MED ORDER — DEXMEDETOMIDINE HCL IN NACL 400 MCG/100ML IV SOLN
0.4000 ug/kg/h | INTRAVENOUS | Status: DC
  Administered 2013-07-10: 0.8 ug/kg/h via INTRAVENOUS
  Administered 2013-07-10: 0.4 ug/kg/h via INTRAVENOUS
  Administered 2013-07-10 – 2013-07-11 (×2): 1 ug/kg/h via INTRAVENOUS
  Administered 2013-07-11 (×2): 1.2 ug/kg/h via INTRAVENOUS
  Administered 2013-07-11: 1 ug/kg/h via INTRAVENOUS
  Administered 2013-07-11 – 2013-07-12 (×4): 1.2 ug/kg/h via INTRAVENOUS
  Administered 2013-07-12: 0.5 ug/kg/h via INTRAVENOUS
  Administered 2013-07-12: 1 ug/kg/h via INTRAVENOUS
  Administered 2013-07-12 – 2013-07-14 (×10): 1.2 ug/kg/h via INTRAVENOUS
  Filled 2013-07-10 (×6): qty 100
  Filled 2013-07-10 (×2): qty 50
  Filled 2013-07-10 (×18): qty 100

## 2013-07-10 MED ORDER — SODIUM CHLORIDE 0.9 % IV SOLN: 500.0000 mL | Freq: Once | INTRAVENOUS | Status: AC | PRN

## 2013-07-10 MED ORDER — DOPAMINE-DEXTROSE 1.6-5 MG/ML-% IV SOLN
INTRAVENOUS | Status: DC | PRN
  Administered 2013-07-10: 3 ug/kg/min via INTRAVENOUS

## 2013-07-10 MED ORDER — FENTANYL CITRATE 0.05 MG/ML IJ SOLN
25.0000 ug | INTRAMUSCULAR | Status: DC | PRN
  Administered 2013-07-10 – 2013-07-11 (×2): 50 ug via INTRAVENOUS
  Administered 2013-07-11 (×3): 100 ug via INTRAVENOUS
  Administered 2013-07-11: 50 ug via INTRAVENOUS
  Administered 2013-07-12 – 2013-07-14 (×7): 100 ug via INTRAVENOUS
  Administered 2013-07-14: 50 ug via INTRAVENOUS
  Administered 2013-07-14 – 2013-07-15 (×4): 100 ug via INTRAVENOUS
  Administered 2013-07-15 (×2): 50 ug via INTRAVENOUS
  Administered 2013-07-15 – 2013-07-16 (×6): 100 ug via INTRAVENOUS
  Filled 2013-07-10 (×23): qty 2

## 2013-07-10 MED ORDER — PHENYLEPHRINE HCL 10 MG/ML IJ SOLN
30.0000 ug/min | INTRAVENOUS | Status: DC
  Administered 2013-07-11: 75 ug/min via INTRAVENOUS
  Administered 2013-07-11: 70 ug/min via INTRAVENOUS
  Administered 2013-07-11: 35 ug/min via INTRAVENOUS
  Administered 2013-07-11: 30 ug/min via INTRAVENOUS
  Filled 2013-07-10 (×4): qty 2

## 2013-07-10 MED ORDER — SODIUM CHLORIDE 0.9 % IV SOLN: INTRAVENOUS | Status: DC

## 2013-07-10 MED ORDER — FUROSEMIDE 10 MG/ML IJ SOLN
40.0000 mg | Freq: Once | INTRAMUSCULAR | Status: AC
  Administered 2013-07-10: 40 mg via INTRAVENOUS

## 2013-07-10 MED ORDER — LABETALOL HCL 5 MG/ML IV SOLN
10.0000 mg | INTRAVENOUS | Status: DC | PRN
  Filled 2013-07-10: qty 4

## 2013-07-10 MED ORDER — HYDRALAZINE HCL 20 MG/ML IJ SOLN: 10.0000 mg | INTRAMUSCULAR | Status: DC | PRN

## 2013-07-10 MED ORDER — MANNITOL 25 % IV SOLN
INTRAVENOUS | Status: DC | PRN
  Administered 2013-07-10 (×2): 12.5 g via INTRAVENOUS

## 2013-07-10 MED ORDER — 0.9 % SODIUM CHLORIDE (POUR BTL) OPTIME
TOPICAL | Status: DC | PRN
  Administered 2013-07-10: 2000 mL
  Administered 2013-07-10: 1000 mL

## 2013-07-10 MED ORDER — ROCURONIUM BROMIDE 100 MG/10ML IV SOLN
INTRAVENOUS | Status: DC | PRN
  Administered 2013-07-10: 50 mg via INTRAVENOUS

## 2013-07-10 MED ORDER — ACETAMINOPHEN 650 MG RE SUPP
325.0000 mg | RECTAL | Status: DC | PRN
  Administered 2013-07-12: 650 mg via RECTAL

## 2013-07-10 MED ORDER — MIDAZOLAM HCL 5 MG/5ML IJ SOLN
INTRAMUSCULAR | Status: DC | PRN
  Administered 2013-07-10: 1 mg via INTRAVENOUS
  Administered 2013-07-10: 2 mg via INTRAVENOUS
  Administered 2013-07-10: 1 mg via INTRAVENOUS

## 2013-07-10 MED ORDER — INSULIN ASPART 100 UNIT/ML ~~LOC~~ SOLN
0.0000 [IU] | SUBCUTANEOUS | Status: DC
  Administered 2013-07-10: 8 [IU] via SUBCUTANEOUS
  Administered 2013-07-11: 2 [IU] via SUBCUTANEOUS
  Administered 2013-07-11: 5 [IU] via SUBCUTANEOUS
  Administered 2013-07-11: 8 [IU] via SUBCUTANEOUS
  Administered 2013-07-11 (×2): 3 [IU] via SUBCUTANEOUS
  Administered 2013-07-11: 2 [IU] via SUBCUTANEOUS
  Administered 2013-07-12: 3 [IU] via SUBCUTANEOUS
  Administered 2013-07-12 (×3): 2 [IU] via SUBCUTANEOUS
  Administered 2013-07-12: 3 [IU] via SUBCUTANEOUS
  Administered 2013-07-13 – 2013-07-20 (×9): 2 [IU] via SUBCUTANEOUS
  Administered 2013-07-20: 3 [IU] via SUBCUTANEOUS
  Administered 2013-07-20 – 2013-07-21 (×3): 2 [IU] via SUBCUTANEOUS

## 2013-07-10 MED ORDER — DOPAMINE-DEXTROSE 3.2-5 MG/ML-% IV SOLN
5.0000 ug/kg/min | INTRAVENOUS | Status: DC
  Administered 2013-07-11 – 2013-07-13 (×3): 5 ug/kg/min via INTRAVENOUS
  Filled 2013-07-10 (×3): qty 250

## 2013-07-10 MED ORDER — MAGNESIUM SULFATE 40 MG/ML IJ SOLN: 2.0000 g | Freq: Once | INTRAMUSCULAR | Status: AC | PRN

## 2013-07-10 MED ORDER — FUROSEMIDE 10 MG/ML IJ SOLN
80.0000 mg | INTRAMUSCULAR | Status: DC
  Filled 2013-07-10: qty 8

## 2013-07-10 MED ORDER — ALBUMIN HUMAN 5 % IV SOLN
INTRAVENOUS | Status: DC | PRN
  Administered 2013-07-10 (×3): via INTRAVENOUS

## 2013-07-10 MED ORDER — PROTAMINE SULFATE 10 MG/ML IV SOLN
INTRAVENOUS | Status: DC | PRN
  Administered 2013-07-10: 100 mg via INTRAVENOUS

## 2013-07-10 MED ORDER — VECURONIUM BROMIDE 10 MG IV SOLR
INTRAVENOUS | Status: DC | PRN
  Administered 2013-07-10: 3 mg via INTRAVENOUS
  Administered 2013-07-10 (×7): 2 mg via INTRAVENOUS

## 2013-07-10 MED ORDER — ACETAMINOPHEN 325 MG PO TABS: 325.0000 mg | ORAL_TABLET | ORAL | Status: DC | PRN

## 2013-07-10 MED ORDER — DEXTROSE 5 % IV SOLN
30.0000 ug/min | INTRAVENOUS | Status: DC
  Administered 2013-07-10: 50 ug/min via INTRAVENOUS
  Filled 2013-07-10 (×2): qty 1

## 2013-07-10 SURGICAL SUPPLY — 59 items
CANISTER SUCTION 2500CC (MISCELLANEOUS) ×2 IMPLANT
CATH EMB 5FR 80CM (CATHETERS) ×2 IMPLANT
CLIP TI MEDIUM 24 (CLIP) ×2 IMPLANT
CLIP TI WIDE RED SMALL 24 (CLIP) ×2 IMPLANT
COVER SURGICAL LIGHT HANDLE (MISCELLANEOUS) ×2 IMPLANT
DRAPE WARM FLUID 44X44 (DRAPE) ×2 IMPLANT
DRSG COVADERM 4X14 (GAUZE/BANDAGES/DRESSINGS) ×2 IMPLANT
DRSG COVADERM 4X8 (GAUZE/BANDAGES/DRESSINGS) ×2 IMPLANT
ELECT BLADE 4.0 EZ CLEAN MEGAD (MISCELLANEOUS) ×2
ELECT BLADE 6.5 EXT (BLADE) IMPLANT
ELECT REM PT RETURN 9FT ADLT (ELECTROSURGICAL) ×4
ELECTRODE BLDE 4.0 EZ CLN MEGD (MISCELLANEOUS) ×1 IMPLANT
ELECTRODE REM PT RTRN 9FT ADLT (ELECTROSURGICAL) ×2 IMPLANT
FELT TEFLON 4 X1 (Mesh General) ×2 IMPLANT
GLOVE BIO SURGEON STRL SZ 6.5 (GLOVE) ×4 IMPLANT
GLOVE BIO SURGEON STRL SZ8 (GLOVE) ×2 IMPLANT
GLOVE BIOGEL PI IND STRL 7.0 (GLOVE) ×3 IMPLANT
GLOVE BIOGEL PI INDICATOR 7.0 (GLOVE) ×3
GLOVE SS BIOGEL STRL SZ 7 (GLOVE) ×3 IMPLANT
GLOVE SUPERSENSE BIOGEL SZ 7 (GLOVE) ×3
GOWN STRL NON-REIN LRG LVL3 (GOWN DISPOSABLE) ×6 IMPLANT
GRAFT HEMASHIELD 14X8MM (Vascular Products) ×2 IMPLANT
HEMOSTAT SNOW SURGICEL 2X4 (HEMOSTASIS) ×8 IMPLANT
INSERT FOGARTY 61MM (MISCELLANEOUS) ×2 IMPLANT
INSERT FOGARTY SM (MISCELLANEOUS) ×4 IMPLANT
KIT BASIN OR (CUSTOM PROCEDURE TRAY) ×2 IMPLANT
KIT ROOM TURNOVER OR (KITS) ×2 IMPLANT
LOOP VESSEL MAXI BLUE (MISCELLANEOUS) IMPLANT
LOOP VESSEL MINI RED (MISCELLANEOUS) IMPLANT
NS IRRIG 1000ML POUR BTL (IV SOLUTION) ×6 IMPLANT
PACK AORTA (CUSTOM PROCEDURE TRAY) ×2 IMPLANT
PAD ARMBOARD 7.5X6 YLW CONV (MISCELLANEOUS) ×4 IMPLANT
SPONGE LAP 18X18 X RAY DECT (DISPOSABLE) ×4 IMPLANT
SPONGE LAP 4X18 X RAY DECT (DISPOSABLE) ×2 IMPLANT
STAPLER VISISTAT 35W (STAPLE) ×4 IMPLANT
STOPCOCK MORSE 400PSI 3WAY (MISCELLANEOUS) ×2 IMPLANT
SUT ETHIBOND 5 LR DA (SUTURE) IMPLANT
SUT PROLENE 1 XLH 60 (SUTURE) ×4 IMPLANT
SUT PROLENE 2 0 MH 48 (SUTURE) ×6 IMPLANT
SUT PROLENE 3 0 SH1 36 (SUTURE) ×10 IMPLANT
SUT PROLENE 4 0 RB 1 (SUTURE) ×1
SUT PROLENE 4-0 RB1 .5 CRCL 36 (SUTURE) ×1 IMPLANT
SUT PROLENE 5 0 CC 1 (SUTURE) ×8 IMPLANT
SUT PROLENE 5 0 CC1 (SUTURE) ×2 IMPLANT
SUT PROLENE 6 0 C 1 30 (SUTURE) ×8 IMPLANT
SUT PROLENE 6 0 CC (SUTURE) ×2 IMPLANT
SUT SILK 1 TIES 10X30 (SUTURE) ×2 IMPLANT
SUT SILK 2 0 SH CR/8 (SUTURE) ×2 IMPLANT
SUT SILK 3 0 SH CR/8 (SUTURE) IMPLANT
SUT VIC AB 2-0 CTX 36 (SUTURE) ×4 IMPLANT
SUT VIC AB 3-0 MH 27 (SUTURE) ×10 IMPLANT
SUT VIC AB 3-0 SH 27 (SUTURE) ×1
SUT VIC AB 3-0 SH 27X BRD (SUTURE) ×1 IMPLANT
SYR 3ML LL SCALE MARK (SYRINGE) ×2 IMPLANT
TOWEL OR 17X24 6PK STRL BLUE (TOWEL DISPOSABLE) ×4 IMPLANT
TOWEL OR 17X26 10 PK STRL BLUE (TOWEL DISPOSABLE) ×4 IMPLANT
TRAY FOLEY CATH 16FRSI W/METER (SET/KITS/TRAYS/PACK) ×2 IMPLANT
WATER STERILE IRR 1000ML POUR (IV SOLUTION) ×4 IMPLANT
YANKAUER SUCT BULB TIP NO VENT (SUCTIONS) ×4 IMPLANT

## 2013-07-10 NOTE — Interval H&P Note (Signed)
History and Physical Interval Note:  07/10/2013 7:14 AM  [name]  has presented today for surgery, with the diagnosis of AAA  The various methods of treatment have been discussed with the patient and family. After consideration of risks, benefits and other options for treatment, the patient has consented to  Procedure(s): ANEURYSM ABDOMINAL AORTIC REPAIR & RIGHT COMMON ILIAC ARTERY ANEURYSM REPAIR (N/A) as a surgical intervention .  The patient's history has been reviewed, patient examined, no change in status, stable for surgery.  I have reviewed the patient's chart and labs.  Questions were answered to the patient's satisfaction.     Tinnie Gens

## 2013-07-10 NOTE — Op Note (Signed)
OPERATIVE REPORT  Date of Surgery: 07/10/2013  Surgeon: Tinnie Gens, MD  Assistant: Dionicio Stall  Pre-op Diagnosis: Large perirenal abdominal aortic aneurysm and right common iliac artery aneurysm Post-op Diagnosis: Same  Procedure: Procedure(s): Resection and Graftiong of perirenal AAA; Insertion 14 x 8 Hemashield Graft Aorta to Left Common Iliac and to Right Common Femoral Artery With Ligastion of Right External and Interanl Iliac Artery to treat large right common iliac artery aneurysm  Anesthesia: General  EBL: [id number] cc  Complications: None  Procedure Details:  The patient was taken to the operating room placed in supine position after satisfactory monitoring lines were placed by anesthesia including Swan-Ganz catheter and radial arterial line. Abdomen and groins prepped Betadine scrub and solution draped in routine sterile manner. Midline incision was made from xiphoid to pubis carried down to subcutaneous tissue and linea alba using the Bovie. No cavity was entered and thoroughly explored. The stomach duodenum small bowel and colon were unremarkable. Liver was smooth and no palpable masses. Gallbladder was small but no inflammation was noted and no stones were definitely palpable. Intestines were reflected to the right side transverse colon elevated exposing a large abdominal aortic aneurysm. Procedure was very difficult technically because of the large body habitus and the aneurysm being located deep in the retroperitoneum and the pelvis. Retroperitoneum was incised 6 up to the renal arteries. The aorta was exposed proximal to the renal arteries after ligating and dividing the left renal vein being careful to preserve retrograde flow through the adrenal and gonadal branches. Both right and left great arteries were dissected free for control. The aneurysm extended up to the origin of the left renal artery. 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 The right common iliac artery aneurysm was exposed down to the origin of the external iliac artery which extended laterally it was deep in the pelvis. It was encircled with an umbilical tape for ligation. It was impossible to expose the branches of the internal iliac artery from the parameter therefore it would be oversewn from within. Left common iliac artery was exposed down to its bifurcation and it was a normal size vessel with some calcification. On the right side it was decided we should go to the common femoral level therefore a longitudinal incision was made in the right inguinal area carried down to subcutaneous tissue, superficial and profunda femoris arteries were dissected free. There was a good pulse in the common femoral artery. Retroperitoneal tunnels created on the right side posterior to the ureter. Following this the patient was given 25 g of mannitol and heparinized. The aorta was occluded proximal to both renal arteries the iliac vessels occluded with vascular clamps and the right external iliac ligated with its at its origin. Aneurysm was opened anteriorly. All of the lumbars arising from the main body of the aneurysm were chronically occluded. Aneurysm was opened anteriorly and extended down through the length of the right common iliac artery and internal iliac on the right was oversewn from within using 2 and 3-0 Prolene sutures. The neck of the aneurysm was then transected about 2 cm distal to the right renal artery. There was a prominent bulge of the aneurysm the patient's left side which extended up to the renal artery therefore a 1/2 cm cuff was preserved on the left and the anastomosis was done distally both renal arteries. A 14 x 8 mm Hemashield Dacron graft was anastomosed end-to-end to the aortic stump using continuous 3-0 Prolene  but she hasn't strip of felt. There were a few leaks noted anteriorly and to the left side which were repaired pledgeted Prolene sutures.  Following this attention turned to the left iliac artery which was transected distal to its origin there was good backbleeding the left limb of the graft transected and anastomosed end-to-end with 5-0 Prolene. Left leg was then opened. Tension turned to the right side where the graft was tunneled posterior to the ureter to the right inguinal wound. Femoral vessels were occluded with vascular clamps. Longitudinal opening made in the common femoral artery extended down the superficial femoral about 3-4 cm. All vessels were patent. The graft was spatulated and anastomosed in side 5-0 Prolene. Clamps were then released there was good flow down both femoral vessels. Further exploration in the right pelvis revealed that additional sutures were needed to oversew the internal iliac artery from within and this was achieved with some difficulty because of the depth of the pelvis. However adequate hemostasis was achieved. Following this excellent Doppler flow was present in both renal arteries protamine was given to reverse the heparin following adequate hemostasis the inguinal wound closed in layers with interrupted 2-0 Vicryl continuous 2-0 Vicryl and subcuticular 3-0 Vicryl. Attention turned to the abdomen where aneurysm sac closed over the graft with 3-0 Vicryl retroperitoneum approximated with 3-0 Vicryl following thorough irrigation linea alba closed with #1 Prolene skin with staples. Patient had good Doppler flow in both posterior tibial arteries. The aorta was occluded proximal to the renal arteries for proximal a one-hour period initially this was 45 minutes and there were some subsequent clamping and this declamping to repair the leak in the proximal anastomosis. The patient began making very sluggish urine following the clamping and was given 20 mg of Lasix followed by 40 mg of Lasix blood pressure running in the [id number] systolic range on dopamine and amiodarone drip. Dopamine had been in place throughout the entire  procedure. Patient was left on the ventilator and not reversed and taken directly to the surgical intensive care unit in stable condition. He also received 2 units of fresh frozen plasma, 4 units of packed red blood cells from the blood bank, and 1500 cc of blood from the [facility].       Kellie Simmering, MD 07/10/2013 1:59 PM

## 2013-07-10 NOTE — Preoperative (Signed)
Beta Blockers   Reason not to administer Beta Blockers:Not Applicable 

## 2013-07-10 NOTE — Anesthesia Preprocedure Evaluation (Addendum)
Anesthesia Evaluation  Patient identified by MRN, date of birth, ID band Patient awake    Reviewed: Allergy & Precautions, H&P , NPO status , Patient's Chart, lab work & pertinent test results  Airway Mallampati: II TM Distance: >3 FB Neck ROM: Full    Dental  (+) Teeth Intact   Pulmonary sleep apnea and Continuous Positive Airway Pressure Ventilation , COPDformer smoker,          Cardiovascular hypertension, + CAD, + CABG and +CHF Rhythm:Regular Rate:Normal  EF 55%   Neuro/Psych Anxiety    GI/Hepatic GERD-  Medicated and Controlled,  Endo/Other  diabetes, Type 2, Oral Hypoglycemic Agents  Renal/GU Renal InsufficiencyRenal disease     Musculoskeletal   Abdominal (+) + obese,   Peds  Hematology   Anesthesia Other Findings   Reproductive/Obstetrics                          Anesthesia Physical Anesthesia Plan  ASA: III  Anesthesia Plan: General   Post-op Pain Management:    Induction: Intravenous  Airway Management Planned: Oral ETT  Additional Equipment: Arterial line, CVP, PA Cath and Ultrasound Guidance Line Placement  Intra-op Plan:   Post-operative Plan: Extubation in OR  Informed Consent: I have reviewed the patients History and Physical, chart, labs and discussed the procedure including the risks, benefits and alternatives for the proposed anesthesia with the patient or authorized representative who has indicated his/her understanding and acceptance.   Dental advisory given  Plan Discussed with: CRNA and Surgeon  Anesthesia Plan Comments:        Anesthesia Quick Evaluation

## 2013-07-10 NOTE — Consult Note (Signed)
Name: [name] MRN: [id number] DOB: 13-Oct-1940    ADMISSION DATE:  07/10/2013 CONSULTATION DATE:  1/8  REFERRING MD :  Kellie Simmering  PRIMARY SERVICE: Kellie Simmering   CHIEF COMPLAINT:  Post-op   BRIEF PATIENT DESCRIPTION:  73 year old male w/ multiple medical co-morbids: OSA, CAD, prior CABG and AVR w/ bioprosthetic valve,  s/p resection and grafting of perirenal AAA 1/8. Intra op course notable for estimated 50 minutes clamp time above the renals. Returned to the ICU post-op on the vent. PCCM asked to assist w/ post-op care.   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 Intra op course notable for estimated 50 minutes clamp time above the renals. Returned to the ICU post-op on the vent. PCCM asked to assist w/ post-op care.   PAST MEDICAL HISTORY :  Past Medical History  Diagnosis Date  . Hypertension   . Hyperlipidemia   . Prediabetes   . CHF (congestive heart failure) 07/2012; 10/17/2012  . COPD (chronic obstructive pulmonary disease)     a.  History of heavy smoking. PFTs (4/14) with mixed obstructive (COPD) and restrictive (post-ARDS) picture.   . Pneumonia 07/2012    PNA (H1N1 influenza + pneumococcus) in [id number] complicated by respiratory failure/ARDS. Required tracheostomy, now weaned off. Had left empyema requiring chest tube.   . Diabetes mellitus without complication   . History of blood transfusion     "lots since January" (10/17/2012)  . Ischemic cardiomyopathy   . Anemia of chronic disease   . Aortic stenosis     Moderate to severe by echo in 4/14. Bioprosthetic #21 [facility] Ease aortic valve replacement in 4/14.   Marland Kitchen CAD (coronary  artery disease)     a. 4/14 CABG: LIMA-LAD, seq SVG-ramus & OM1, SVG-PDA  . Carotid arterial disease     Carotid dopplers (0000000) with A999333 LICA stenosis.   Marland Kitchen AAA (abdominal aortic aneurysm)     5/14 CT showed > 6 cm AAA, also right iliac aneurysm. Not stent graft candidate.   . Esophageal reflux   . Ischemic cardiomyopathy     a. 4/14 LHC: pLAD 95, ost ramus 70-80, mLCx 90, EF 30%  . Chronic systolic heart failure     a. 1/14 ECHO: sev dil LV, EF30-35%, diff HK, mild MR, AS severe, AV grad 35 b. 8/14 ECHO: EF 55-60%, mild biopros AV sten mn grad. 25, RV mild dil, RA mild dil  . Complication of anesthesia     during last surgery had to be given special medicine b/c "something dropped" during surgery   . Anxiety     pt. admits that he has anxiety at times   . Shortness of breath     still goes deer hunting by himself  . OSA (obstructive sleep apnea)     on cpap- every sleep time.   . Chronic kidney disease     increased creatinine recently - being followed, near kidney failure fr. contrast dye   . Prostate cancer      s/p radiation treatment. - (PT. DENIES)Has indwelling foley.   . Arthritis    Past Surgical History  Procedure Laterality  Date  . Transurethral microwave therapy  10/15/2012  . Nasal fracture surgery  1970's  . Tracheostomy  07/2012  . Tracheostomy closure  08/2012  . Anal fissure repair  2008  . Coronary artery bypass graft N/A 10/25/2012    Procedure: CORONARY ARTERY BYPASS GRAFTING (CABG);  Surgeon: Melrose Nakayama, MD;  Location: [place];  Service: Open Heart Surgery;  Laterality: N/A;  times 4 using left internal mammary artery and endoscopically harvested bilateral saphenous vein   . Aortic valve replacement N/A 10/25/2012    Procedure: AORTIC VALVE REPLACEMENT (AVR);  Surgeon: Melrose Nakayama, MD;  Location: [place];  Service: Open Heart Surgery;  Laterality: N/A;  . Intraoperative transesophageal echocardiogram N/A 10/25/2012    Procedure: INTRAOPERATIVE  TRANSESOPHAGEAL ECHOCARDIOGRAM;  Surgeon: Melrose Nakayama, MD;  Location: [place];  Service: Open Heart Surgery;  Laterality: N/A;  . Cardiac valve replacement     Prior to Admission medications   Medication Sig Start Date End Date Taking? Authorizing Provider  allopurinol (ZYLOPRIM) 300 MG tablet Take 300 mg by mouth daily.   Yes Historical Provider, MD  aspirin EC 325 MG EC tablet Take 1 tablet (325 mg total) by mouth daily. 11/04/12  Yes Donielle Liston Alba, PA-C  atorvastatin (LIPITOR) 80 MG tablet Take 80 mg by mouth daily before breakfast. 11/18/12  Yes Larey Dresser, MD  bisoprolol (ZEBETA) 5 MG tablet Take 2.5 mg by mouth daily.   Yes Historical Provider, MD  budesonide (PULMICORT) 0.25 MG/2ML nebulizer solution Take 2 mLs (0.25 mg total) by nebulization 2 (two) times daily. Dx 496 01/21/13  Yes Tammy S Parrett, NP  clonazePAM (KLONOPIN) 0.5 MG tablet Take 1 tablet (0.5 mg total) by mouth 2 (two) times daily as needed (anxiety). 12/26/12  Yes Roger A Arguello, PA-C  furosemide (LASIX) 80 MG tablet Take 120 mg by mouth 2 (two) times daily.   Yes Historical Provider, MD  ipratropium-albuterol (DUONEB) 0.5-2.5 (3) MG/3ML SOLN Take 3 mLs by nebulization every 6 (six) hours as needed. 07/07/13  Yes Tammy S Parrett, NP  metolazone (ZAROXOLYN) 2.5 MG tablet Take 2.5 mg 30 min prior to Lasix on Wed. 12/3 and Sat. 12/6 and then every Wednesday. 06/03/13  Yes Larey Dresser, MD  mupirocin ointment (BACTROBAN) 2 % 1 Application, nasally, 2 times daily for 5 days prior to surgery 07/04/13  Yes Mal Misty, MD  nitroGLYCERIN (NITROSTAT) 0.4 MG SL tablet Place 1 tablet (0.4 mg total) under the tongue every 5 (five) minutes as needed for chest pain. 12/26/12  Yes Roger A Arguello, PA-C  pantoprazole (PROTONIX) 40 MG tablet Take 40 mg by mouth at bedtime.  12/17/12  Yes Historical Provider, MD  potassium chloride (KLOR-CON 10) 10 MEQ tablet Take 4 tablets (40 mEq total) by mouth 2 (two) times daily. 07/07/13   Yes Larey Dresser, MD  sitaGLIPtin (JANUVIA) 100 MG tablet Take 100 mg by mouth daily before breakfast.    Yes Historical Provider, MD  tamsulosin (FLOMAX) 0.4 MG CAPS capsule Take 0.4 mg by mouth at bedtime.  03/04/13  Yes Historical Provider, MD   Allergies  Allergen Reactions  . Omnipaque [Iohexol] Other (See Comments)    Decreased kidney function  . Cephalosporins Rash    Blisters    FAMILY HISTORY:  Family History  Problem Relation Age of Onset  . Congestive Heart Failure    . Heart disease     SOCIAL HISTORY:  reports that he quit smoking about a year  ago. His smoking use included Cigarettes. He has a 116 pack-year smoking history. He has never used smokeless tobacco. He reports that he does not drink alcohol or use illicit drugs.  REVIEW OF SYSTEMS:  unable  SUBJECTIVE:  Sedated on vent  VITAL SIGNS: Temp:  [98.1 F (36.7 C)-98.4 F (36.9 C)] 98.1 F (36.7 C) (01/08 1446) Pulse Rate:  [94-97] 97 (01/08 1446) Resp:  [20] 20 (01/08 1446) BP: (108-151)/(73-74) 151/73 mmHg (01/08 1446) SpO2:  [94 %-99 %] 99 % (01/08 1446) FiO2 (%):  [50 %] 50 % (01/08 1446) Weight:  [101.77 kg (224 lb 5.8 oz)] 101.77 kg (224 lb 5.8 oz) (01/08 1500) HEMODYNAMICS:   VENTILATOR SETTINGS: Vent Mode:  [-]  FiO2 (%):  [50 %] 50 % INTAKE / OUTPUT: Intake/Output     01/07 0701 - 01/08 0700 01/08 0701 - 01/09 0700   I.V. (mL/kg)  4000 (39.3)   Blood  3359   IV Piggyback  1000   Total Intake(mL/kg)  8359 (82.1)   Urine (mL/kg/hr)  555 (0.7)   Blood  3600 (4.2)   Total Output   4155   Net   +4204          PHYSICAL EXAMINATION: General:  Sedated on vent agitated at times  Neuro:  Agitated but f/c Moves all ext  HEENT:  Orally intubated. Facial edema  Cardiovascular:  rrr Lungs:  Decreased bases, occ rhonchi, no wheeze  Abdomen:  Mid-abd incision CD&I, hypoactive  Musculoskeletal:  Intact  Skin:  LE edema   LABS:  CBC  Recent Labs Lab 07/04/13 1056 07/10/13 1431  WBC 6.9  15.9*  HGB 13.4 14.8  HCT 42.6 43.4  PLT 143* 105*   Coag's  Recent Labs Lab 07/04/13 1056 07/10/13 1431  APTT 29 33  INR 1.04 1.34   BMET  Recent Labs Lab 07/04/13 1056 07/10/13 1431  NA 140 136*  K 3.9 5.4*  CL 99 102  CO2 22 22  BUN 41* 31*  CREATININE 1.47* 1.71*  GLUCOSE 160* 285*   Electrolytes  Recent Labs Lab 07/04/13 1056 07/10/13 1431  CALCIUM 9.3 7.2*  MG  --  1.3*   Sepsis Markers No results found for this basename: LATICACIDVEN, PROCALCITON, O2SATVEN,  in the last 168 hours ABG  Recent Labs Lab 07/04/13 1056 07/10/13 1447  PHART 7.417 7.278*  PCO2ART 42.1 50.2*  PO2ART 72.1* 75.0*   Liver Enzymes  Recent Labs Lab 07/04/13 1056  AST 25  ALT 28  ALKPHOS 123*  BILITOT 0.8  ALBUMIN 4.0   Cardiac Enzymes No results found for this basename: TROPONINI, PROBNP,  in the last 168 hours Glucose  Recent Labs Lab 07/10/13 0559  GLUCAP 137*    Imaging Dg Chest Portable 1 View  07/10/2013   CLINICAL DATA:  Line placement.  EXAM: PORTABLE CHEST - 1 VIEW  COMPARISON:  07/04/2013  FINDINGS: Sternotomy wires unchanged. There has been placement of an enteric tube which courses through the region of the stomach and off the film as tip is not visualized. An endotracheal tube has been placed with tip approximately 3 cm above the carina. Left IJ Swan-Ganz catheter is present with tip overlying the proximal right lower lobar pulmonary artery. No evidence of pneumothorax.  Lungs are hypoinflated with subtle left basilar opacification likely atelectasis. There is blunting of the right costophrenic angle likely a small amount of pleural fluid. There is stable cardiomegaly. Remainder of the exam is unchanged.  IMPRESSION: Suggestion of a small  right pleural effusion. Minimal left basilar opacification suggesting atelectasis.  Tubes and lines as described.   Electronically Signed   By: Marin Olp M.D.   On: 07/10/2013 15:04   Dg Abd Portable 1v  07/10/2013    CLINICAL DATA:  Postop abdominal aortic aneurysm and right common iliac artery aneurysm repair.  EXAM: PORTABLE ABDOMEN - 1 VIEW  COMPARISON:  Preoperative CTA abdomen and pelvis 12/31/2012.  FINDINGS: Midline surgical skin staples. Nasogastric tube in the stomach. Bowel gas pattern unremarkable without evidence of obstruction or significant ileus. No visible free intraperitoneal air on the supine image. No unexpected opaque foreign bodies. No visible opaque urinary tract calculi. Degenerative changes involving the lower lumbar spine.  IMPRESSION: No acute abdominal abnormality.   Electronically Signed   By: Evangeline Dakin M.D.   On: 07/10/2013 15:18     CXR: Bibasilar atx ETT and PAC in good position.   ASSESSMENT / PLAN:  PULMONARY A: Acute respiratory failure in setting of multiple metabolic derangements, atelectasis and underlying OSA and COPD.   P:   Full vent support over night  F/u abg and CXR WUA/SBT in AM if OK by vascular Scheduled BDs   CARDIOVASCULAR A:  S/p resection and grafting of perirenal AAA  1/8  H/o CAD and severe AS s/p CABG and AVR w/ Bioprosthetic valve 4/14, last EF 55-60% 8/14 HTN P:  MAP goal >65 Dopamine 75mcg/min per vascular Use neo for blood pressure support Has PAC will order hemodynamic profile qshift  Cont tele monitoring Post-op care per vascular   RENAL A:  CKD stage 3A (BL GFR 46 w/ cr 1.4-1.7). Very high risk for acute on chronic renal failure due to prolonged clamp time above the renals during AAA repair  P:   STAT chemistry  Ensure adequate MAP Foley cath  Trend I&O  GASTROINTESTINAL A:  No acute. At risk for post-op ileus P:   NPO NGT to LIWS ppi   HEMATOLOGIC A:   Blood loss anemia s/p AAA, acute on chronic. Baseline hgb 12-13 range. Got 4 units PRBC and 4 units FFP intra/post-op  Mild thrombocytopenia at baseline  P:  F/u cbc and INR  PPI Hold off on anticoagulation for now   INFECTIOUS A:  No acute infection  P:     See flow sheet  ENDOCRINE A:  DM w/ hyperglycemia  P:   Hyperglycemia protocol   NEUROLOGIC A:  Acute encephalopathy: currently in setting of residual anesthesia +/- residual metabolic derangements  P:   PAD protocol with Precedex/fentanyl Supportive care   Attending: I have seen and examined the patient with nurse practitioner/resident and agree with the note above.    TODAY'S SUMMARY: Would continue full vent support overnight, f/u chemistry in am to assess for severe metabolic derangements, if renal fxn stable and hemodynamics stable could assess for weaning in am 1/9. Would focus on maintaining MAP of >65  I have personally obtained a history, examined the patient, evaluated laboratory and imaging results, formulated the assessment and plan and placed orders. CRITICAL CARE: The patient is critically ill with multiple organ systems failure and requires high complexity decision making for assessment and support, frequent evaluation and titration of therapies, application of advanced monitoring technologies and extensive interpretation of multiple databases. Critical Care Time devoted to patient care services described in this note is 40 minutes.   Jillyn Hidden PCCM Pager: [phone] Cell: [phone] If no response, call [phone]   07/10/2013, 3:22 PM

## 2013-07-10 NOTE — Anesthesia Postprocedure Evaluation (Signed)
Anesthesia Post Note  Patient: [name]  Procedure(s) Performed: Procedure(s) (LRB): Resection and Graftiong of perirenal AAA; Insertion 14 x 8 Hemashield Graft Aorta to Left Common Iliac and to Right Common Femoral Artery With Ligastion of Right External and Interanl Iliac Artery (N/A)  Anesthesia type: General  Patient location: ICU  Post pain: Pain level controlled  Post assessment: Post-op Vital signs reviewed  Last Vitals:  Filed Vitals:   07/10/13 1446  BP: 151/73  Pulse: 97  Temp: 36.7 C  Resp: 20    Post vital signs: stable  Level of consciousness: Patient remains intubated per anesthesia plan  Complications: No apparent anesthesia complications

## 2013-07-10 NOTE — OR Nursing (Addendum)
1st call to 2300 @ 1325, second call at 1341. Room not ready Doris (unit Secretary)will call OR 12 when ready. Baird Lyons called unit and confirmed ready to move to 2314. Dr. Kellie Simmering checked lower leg pulses in OR 12 @1415 , confirmed patient condition and move to 2314.

## 2013-07-10 NOTE — H&P (View-Only) (Signed)
Subjective:     Patient ID: [name], male   DOB: 1941-01-27, 74 y.o.   MRN: [id number]  HPI this 73 year old male returns for further followup regarding his large abdominal aortic aneurysm and right common iliac artery aneurysm. He underwent coronary artery bypass grafting and pericardial aortic valve replacement by Dr. Roxan Hockey April 24 vein. He has been slowly recuperating and now is back to his preoperative strength level. He saw Dr. Loralie Champagne last week who agrees that he is a stable cardiac-wise as he will be he denies any abdominal or back pain. He is ambulating on a treadmill and walks up to a few blocks.  Past Medical History  Diagnosis Date  . Hypertension   . Hyperlipidemia   . OSA (obstructive sleep apnea)     on cpap  . Prediabetes   . CHF (congestive heart failure) 07/2012; 10/17/2012  . COPD (chronic obstructive pulmonary disease)     a.  History of heavy smoking. PFTs (4/14) with mixed obstructive (COPD) and restrictive (post-ARDS) picture.   . Pneumonia 07/2012    PNA (H1N1 influenza + pneumococcus) in [id number] complicated by respiratory failure/ARDS. Required tracheostomy, now weaned off. Had left empyema requiring chest tube.   . Diabetes mellitus without complication   . History of blood transfusion     "lots since January" (10/17/2012)  . Anxiety   . Ischemic cardiomyopathy   . Anemia of chronic disease   . Prostate cancer      s/p radiation treatment. Has indwelling foley.   . Aortic stenosis     Moderate to severe by echo in 4/14. Bioprosthetic #21 [facility] Ease aortic valve replacement in 4/14.   Marland Kitchen CAD (coronary artery disease)     a. 4/14 CABG: LIMA-LAD, seq SVG-ramus & OM1, SVG-PDA  . Carotid arterial disease     Carotid dopplers (0000000) with A999333 LICA stenosis.   Marland Kitchen AAA (abdominal aortic aneurysm)     5/14 CT showed > 6 cm AAA, also right iliac aneurysm. Not stent graft candidate.   . Esophageal reflux   . Ischemic cardiomyopathy     a. 4/14 LHC:  pLAD 95, ost ramus 70-80, mLCx 90, EF 30%  . Chronic systolic heart failure     a. 1/14 ECHO: sev dil LV, EF30-35%, diff HK, mild MR, AS severe, AV grad 35 b. 8/14 ECHO: EF 55-60%, mild biopros AV sten mn grad. 25, RV mild dil, RA mild dil    History  Substance Use Topics  . Smoking status: Former Smoker -- 2.00 packs/day for 58 years    Types: Cigarettes    Quit date: 07/20/2012  . Smokeless tobacco: Never Used  . Alcohol Use: No     Comment: 10/17/2012 "years since I've had a drink; never had problem with it"    Family History  Problem Relation Age of Onset  . Congestive Heart Failure    . Heart disease      Allergies  Allergen Reactions  . Omnipaque [Iohexol] Other (See Comments)    Decreased kidney function  . Cephalosporins Rash    Blisters    Current outpatient prescriptions:Acetaminophen (TYLENOL PO), Take 325 mg by mouth as needed., Disp: , Rfl: ;  allopurinol (ZYLOPRIM) 100 MG tablet, Take 300 mg by mouth daily., Disp: , Rfl: ;  aspirin EC 325 MG EC tablet, Take 1 tablet (325 mg total) by mouth daily., Disp: 30 tablet, Rfl: ;  atorvastatin (LIPITOR) 80 MG tablet, Take 1 tablet (80 mg total) by  mouth daily., Disp: 30 tablet, Rfl: 6 bisoprolol (ZEBETA) 5 MG tablet, TAKE 1/2 TABLET BY MOUTH AT BEDTIME, Disp: 15 tablet, Rfl: 3;  budesonide (PULMICORT) 0.25 MG/2ML nebulizer solution, Take 2 mLs (0.25 mg total) by nebulization 2 (two) times daily. Dx 496, Disp: 120 mL, Rfl: 12;  clonazePAM (KLONOPIN) 0.5 MG tablet, Take 1 tablet (0.5 mg total) by mouth 2 (two) times daily as needed (anxiety)., Disp: 60 tablet, Rfl: 3 furosemide (LASIX) 80 MG tablet, patient takes 3 tabs daily instead of  4 tabs daily. 1.5 tabs twice daily., Disp: , Rfl: ;  guaifenesin (ROBITUSSIN) 100 MG/5ML syrup, Take 300 mg by mouth 4 (four) times daily as needed for cough., Disp: , Rfl: ;  ipratropium-albuterol (DUONEB) 0.5-2.5 (3) MG/3ML SOLN, Take 3 mLs by nebulization every 6 (six) hours as needed. , Disp: ,  Rfl:  metolazone (ZAROXOLYN) 2.5 MG tablet, Take 2.5 mg 30 min prior to Lasix on Wed. 12/3 and Sat. 12/6 and then every Wednesday., Disp: 30 tablet, Rfl: 3;  nitroGLYCERIN (NITROSTAT) 0.4 MG SL tablet, Place 1 tablet (0.4 mg total) under the tongue every 5 (five) minutes as needed for chest pain., Disp: 25 tablet, Rfl: 12;  pantoprazole (PROTONIX) 40 MG tablet, Take 1 tablet by mouth daily., Disp: , Rfl:  potassium chloride (KLOR-CON 10) 10 MEQ tablet, Take 4 tablets (40 mEq total) by mouth 2 (two) times daily., Disp: , Rfl: ;  sitaGLIPtin (JANUVIA) 100 MG tablet, Take 100 mg by mouth daily., Disp: , Rfl: ;  tamsulosin (FLOMAX) 0.4 MG CAPS capsule, 0.4 mg daily. , Disp: , Rfl:   BP 131/84  Pulse 94  Resp 20  Ht 5' 5.5" (1.664 m)  Wt 224 lb (101.606 kg)  BMI 36.70 kg/m2  Body mass index is 36.7 kg/(m^2).          Review of Systems denies active chest pain but does have dyspnea on exertion. Has leg discomfort with walking, productive cough, generalized weakness. All other systems negative and complete review of systems    Objective:   Physical Exam BP 131/84  Pulse 94  Resp 20  Ht 5' 5.5" (1.664 m)  Wt 224 lb (101.606 kg)  BMI 36.70 kg/m2  Gen.-alert and oriented x3 in no apparent distress HEENT normal for age Lungs no rhonchi or wheezing Cardiovascular regular rhythm no murmurs carotid pulses 3+ palpable no bruits audible Abdomen soft nontender no palpable masses-obese  Musculoskeletal free of  major deformities Skin clear -no rashes Neurologic normal Lower extremities 3+ femoral and posterior tibial pulses palpable bilaterally with no edema  Today I ordered a duplex scan of his abdominal aorta which are reviewed and interpreted. The aneurysm continues to be 7.4 cm in maximum diameter similar to previous measurement in July of 2014       Assessment:     7.4 cm infrarenal abdominal aortic aneurysm with large right common iliac artery aneurysm Status post coronary bypass  grafting and aortic valve replacement with pericardial valve in April 2014 now recovered from surgery COPD with remote history of smoking    Plan:     Plan resection and grafting of abdominal aortic aneurysm and right common iliac artery aneurysm with insertion aorto to right external iliac and left common iliac bypass on Friday, January 9 Risks and benefits thoroughly discussed with patient and his family with potential complications including infection, bleeding, pneumonia, respiratory failure, and death all discussed and patient would like to proceed

## 2013-07-10 NOTE — Transfer of Care (Signed)
Immediate Anesthesia Transfer of Care Note  Patient: [name]  Procedure(s) Performed: Procedure(s): Resection and Graftiong of perirenal AAA; Insertion 14 x 8 Hemashield Graft Aorta to Left Common Iliac and to Right Common Femoral Artery With Ligastion of Right External and Interanl Iliac Artery (N/A)  Patient Location: SICU  Anesthesia Type:General  Level of Consciousness: Patient remains intubated per anesthesia plan  Airway & Oxygen Therapy: Patient remains intubated per anesthesia plan  Post-op Assessment: Report given to PACU RN  Post vital signs: Reviewed and stable  Complications: No apparent anesthesia complications

## 2013-07-11 ENCOUNTER — Inpatient Hospital Stay (HOSPITAL_COMMUNITY): Payer: Medicare Other

## 2013-07-11 DIAGNOSIS — J9620 Acute and chronic respiratory failure, unspecified whether with hypoxia or hypercapnia: Secondary | ICD-10-CM

## 2013-07-11 DIAGNOSIS — N179 Acute kidney failure, unspecified: Secondary | ICD-10-CM

## 2013-07-11 DIAGNOSIS — I517 Cardiomegaly: Secondary | ICD-10-CM

## 2013-07-11 LAB — COMPREHENSIVE METABOLIC PANEL
ALK PHOS: 71 U/L (ref 39–117)
ALT: 25 U/L (ref 0–53)
AST: 64 U/L — AB (ref 0–37)
Albumin: 2.7 g/dL — ABNORMAL LOW (ref 3.5–5.2)
BUN: 41 mg/dL — ABNORMAL HIGH (ref 6–23)
CALCIUM: 7.1 mg/dL — AB (ref 8.4–10.5)
CO2: 22 meq/L (ref 19–32)
Chloride: 101 mEq/L (ref 96–112)
Creatinine, Ser: 3.07 mg/dL — ABNORMAL HIGH (ref 0.50–1.35)
GFR calc non Af Amer: 19 mL/min — ABNORMAL LOW (ref >90)
GFR, EST AFRICAN AMERICAN: 22 mL/min — AB (ref >90)
Glucose, Bld: 245 mg/dL — ABNORMAL HIGH (ref 70–99)
POTASSIUM: 4.6 meq/L (ref 3.7–5.3)
SODIUM: 135 meq/L — AB (ref 137–147)
TOTAL PROTEIN: 5.1 g/dL — AB (ref 6.0–8.3)
Total Bilirubin: 1.3 mg/dL — ABNORMAL HIGH (ref 0.3–1.2)

## 2013-07-11 LAB — CREATININE, URINE, RANDOM: CREATININE, URINE: 32.91 mg/dL

## 2013-07-11 LAB — PREPARE FRESH FROZEN PLASMA
UNIT DIVISION: 0
Unit division: 0

## 2013-07-11 LAB — TYPE AND SCREEN
ABO/RH(D): O POS
ANTIBODY SCREEN: NEGATIVE
UNIT DIVISION: 0
UNIT DIVISION: 0
Unit division: 0
Unit division: 0

## 2013-07-11 LAB — GLUCOSE, CAPILLARY
GLUCOSE-CAPILLARY: 207 mg/dL — AB (ref 70–99)
GLUCOSE-CAPILLARY: 159 mg/dL — AB (ref 70–99)
GLUCOSE-CAPILLARY: 126 mg/dL — AB (ref 70–99)
Glucose-Capillary: 272 mg/dL — ABNORMAL HIGH (ref 70–99)
Glucose-Capillary: 155 mg/dL — ABNORMAL HIGH (ref 70–99)
Glucose-Capillary: 128 mg/dL — ABNORMAL HIGH (ref 70–99)
Glucose-Capillary: 287 mg/dL — ABNORMAL HIGH (ref 70–99)

## 2013-07-11 LAB — MAGNESIUM: Magnesium: 1.6 mg/dL (ref 1.5–2.5)

## 2013-07-11 LAB — URINALYSIS, ROUTINE W REFLEX MICROSCOPIC
Bilirubin Urine: NEGATIVE
GLUCOSE, UA: NEGATIVE mg/dL
KETONES UR: NEGATIVE mg/dL
NITRITE: NEGATIVE
PH: 5.5 (ref 5.0–8.0)
Protein, ur: 30 mg/dL — AB
SPECIFIC GRAVITY, URINE: 1.011 (ref 1.005–1.030)
Urobilinogen, UA: 0.2 mg/dL (ref 0.0–1.0)

## 2013-07-11 LAB — AMYLASE: Amylase: 245 U/L — ABNORMAL HIGH (ref 0–105)

## 2013-07-11 LAB — POCT I-STAT 3, ART BLOOD GAS (G3+)
Acid-base deficit: 4 mmol/L — ABNORMAL HIGH (ref 0.0–2.0)
BICARBONATE: 21.8 meq/L (ref 20.0–24.0)
O2 Saturation: 89 %
PCO2 ART: 42.4 mmHg (ref 35.0–45.0)
PO2 ART: 65 mmHg — AB (ref 80.0–100.0)
Patient temperature: 38.4
TCO2: 23 mmol/L (ref 0–100)
pH, Arterial: 7.327 — ABNORMAL LOW (ref 7.350–7.450)

## 2013-07-11 LAB — CBC
HCT: 39 % (ref 39.0–52.0)
Hemoglobin: 12.8 g/dL — ABNORMAL LOW (ref 13.0–17.0)
MCH: 29.6 pg (ref 26.0–34.0)
MCHC: 32.8 g/dL (ref 30.0–36.0)
MCV: 90.1 fL (ref 78.0–100.0)
PLATELETS: 92 10*3/uL — AB (ref 150–400)
RBC: 4.33 MIL/uL (ref 4.22–5.81)
RDW: 16 % — ABNORMAL HIGH (ref 11.5–15.5)
WBC: 12.5 10*3/uL — ABNORMAL HIGH (ref 4.0–10.5)

## 2013-07-11 LAB — URINE MICROSCOPIC-ADD ON

## 2013-07-11 LAB — SODIUM, URINE, RANDOM: SODIUM UR: 43 meq/L

## 2013-07-11 LAB — PHOSPHORUS: PHOSPHORUS: 3.9 mg/dL (ref 2.3–4.6)

## 2013-07-11 MED ORDER — ACETAMINOPHEN 650 MG RE SUPP
RECTAL | Status: AC
  Administered 2013-07-11: 650 mg
  Filled 2013-07-11: qty 1

## 2013-07-11 MED ORDER — BIOTENE DRY MOUTH MT LIQD
15.0000 mL | Freq: Four times a day (QID) | OROMUCOSAL | Status: DC
  Administered 2013-07-11 – 2013-07-25 (×52): 15 mL via OROMUCOSAL

## 2013-07-11 MED ORDER — CHLORHEXIDINE GLUCONATE 0.12 % MT SOLN
15.0000 mL | Freq: Two times a day (BID) | OROMUCOSAL | Status: DC
  Administered 2013-07-11 – 2013-07-25 (×26): 15 mL via OROMUCOSAL
  Filled 2013-07-11 (×30): qty 15

## 2013-07-11 MED ORDER — FUROSEMIDE 10 MG/ML IJ SOLN
160.0000 mg | Freq: Three times a day (TID) | INTRAVENOUS | Status: DC
  Administered 2013-07-11 – 2013-07-15 (×11): 160 mg via INTRAVENOUS
  Filled 2013-07-11 (×19): qty 16

## 2013-07-11 MED ORDER — ALBUMIN HUMAN 5 % IV SOLN
25.0000 g | Freq: Once | INTRAVENOUS | Status: AC
  Administered 2013-07-11: 25 g via INTRAVENOUS
  Filled 2013-07-11: qty 500

## 2013-07-11 MED ORDER — ALBUTEROL SULFATE (2.5 MG/3ML) 0.083% IN NEBU
2.5000 mg | INHALATION_SOLUTION | RESPIRATORY_TRACT | Status: DC
  Administered 2013-07-11 – 2013-07-12 (×7): 2.5 mg via RESPIRATORY_TRACT
  Filled 2013-07-11 (×7): qty 3

## 2013-07-11 MED FILL — Heparin Sodium (Porcine) Inj 1000 Unit/ML: INTRAMUSCULAR | Qty: 30 | Status: AC

## 2013-07-11 MED FILL — Sodium Chloride IV Soln 0.9%: INTRAVENOUS | Qty: 2000 | Status: AC

## 2013-07-11 NOTE — Progress Notes (Signed)
PT Cancellation Note  Patient Details Name: [name] MRN: [id number] DOB: 09-21-1940   Cancelled Treatment:    Reason Eval/Treat Not Completed: Patient not medically ready. Intubated and sedated   [name] 07/11/2013, 1:49 PM Pager [phone]

## 2013-07-11 NOTE — Progress Notes (Signed)
INITIAL NUTRITION ASSESSMENT  DOCUMENTATION CODES Per approved criteria  -Obesity Unspecified   INTERVENTION:  Recommend nutrition support initiation (EN vs TPN) within 24-48 hours if prolonged intubation expected RD to follow for nutrition care plan  NUTRITION DIAGNOSIS: Inadequate oral intake related to inability to eat as evidenced by NPO status  Goal: Pt to meet >/= 90% of their estimated nutrition needs   Monitor:  Nutrition support initiation, respiratory status, weight, labs, I/O's  Reason for Assessment: VDRF  73 y.o. 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  Patient s/p procedure 1/8: RESECTION AND GRAFTING OF PERIRENAL AAA  Patient is currently intubated on ventilator support -- NGT to LIS MV: 13.4 L/min Temp (24hrs), Avg:99.8 F (37.7 C), Min:98.1 F (36.7 C), Max:101.1 F (38.4 C)   Height: Ht Readings from Last 1 Encounters:  07/10/13 5' 5.5" (1.664 m)    Weight: Wt Readings from Last 1 Encounters:  07/10/13 224 lb 5.8 oz (101.77 kg)    Ideal Body Weight: 136 lb  % Ideal Body Weight: 165%  Wt Readings from Last 10 Encounters:  07/10/13 224 lb 5.8 oz (101.77 kg)  07/10/13 224 lb 5.8 oz (101.77 kg)  07/04/13 224 lb 6.9 oz (101.8 kg)  06/17/13 227 lb 6.4 oz (103.148 kg)  06/17/13 224 lb (101.606 kg)  06/12/13 223 lb (101.152 kg)  05/15/13 216 lb (97.977 kg)  03/20/13 219 lb 3.2 oz (99.428 kg)  03/14/13 216 lb 1.9 oz (98.031 kg)  03/04/13 216 lb 1.6 oz (98.022 kg)    Usual Body Weight: 227 lb  % Usual Body Weight: 98%  BMI:  Body mass index is 36.75 kg/(m^2).  Estimated Nutritional Needs: Kcal: 2300 Protein: 125-135 gm Fluid: per MD  Skin: abdominal surgical incision  Diet Order: NPO  EDUCATION NEEDS: -No education needs identified at this time   Intake/Output Summary (Last 24 hours)  at 07/11/13 1211 Last data filed at 07/11/13 0900  Gross per 24 hour  Intake 7080.63 ml  Output   1935 ml  Net 5145.63 ml    Labs:   Recent Labs Lab 07/10/13 1431 07/11/13 0500  NA 136* 135*  K 5.4* 4.6  CL 102 101  CO2 22 22  BUN 31* 41*  CREATININE 1.71* 3.07*  CALCIUM 7.2* 7.1*  MG 1.3* 1.6  GLUCOSE 285* 245*    CBG (last 3)   Recent Labs  07/11/13 0359 07/11/13 0804 07/11/13 1145  GLUCAP 207* 159* 155*    Scheduled Meds: . albuterol  2.5 mg Nebulization Q4H  . antiseptic oral rinse  15 mL Mouth Rinse QID  . aspirin  324 mg Per NG tube Daily  . chlorhexidine  15 mL Mouth Rinse BID  . insulin aspart  0-15 Units Subcutaneous Q4H  . pantoprazole (PROTONIX) IV  40 mg Intravenous QHS    Continuous Infusions: . dexmedetomidine 1.2 mcg/kg/hr (07/11/13 0915)  . dextrose 5 % and 0.45% NaCl 125 mL/hr at 07/11/13 0640  . DOPamine 5 mcg/kg/min (07/11/13 0948)  . phenylephrine (NEO-SYNEPHRINE) Adult infusion 55 mcg/min (07/11/13 0915)    Past Medical History  Diagnosis Date  . Hypertension   . Hyperlipidemia   . Prediabetes   . CHF (congestive heart failure) 07/2012; 10/17/2012  . COPD (chronic obstructive pulmonary disease)     a.  History of heavy smoking. PFTs (4/14) with mixed obstructive (COPD) and  restrictive (post-ARDS) picture.   . Pneumonia 07/2012    PNA (H1N1 influenza + pneumococcus) in [id number] complicated by respiratory failure/ARDS. Required tracheostomy, now weaned off. Had left empyema requiring chest tube.   . Diabetes mellitus without complication   . History of blood transfusion     "lots since January" (10/17/2012)  . Ischemic cardiomyopathy   . Anemia of chronic disease   . Aortic stenosis     Moderate to severe by echo in 4/14. Bioprosthetic #21 [facility] Ease aortic valve replacement in 4/14.   Marland Kitchen CAD (coronary artery disease)     a. 4/14 CABG: LIMA-LAD, seq SVG-ramus & OM1, SVG-PDA  . Carotid arterial disease     Carotid dopplers  (0000000) with A999333 LICA stenosis.   Marland Kitchen AAA (abdominal aortic aneurysm)     5/14 CT showed > 6 cm AAA, also right iliac aneurysm. Not stent graft candidate.   . Esophageal reflux   . Ischemic cardiomyopathy     a. 4/14 LHC: pLAD 95, ost ramus 70-80, mLCx 90, EF 30%  . Chronic systolic heart failure     a. 1/14 ECHO: sev dil LV, EF30-35%, diff HK, mild MR, AS severe, AV grad 35 b. 8/14 ECHO: EF 55-60%, mild biopros AV sten mn grad. 25, RV mild dil, RA mild dil  . Complication of anesthesia     during last surgery had to be given special medicine b/c "something dropped" during surgery   . Anxiety     pt. admits that he has anxiety at times   . Shortness of breath     still goes deer hunting by himself  . OSA (obstructive sleep apnea)     on cpap- every sleep time.   . Chronic kidney disease     increased creatinine recently - being followed, near kidney failure fr. contrast dye   . Prostate cancer      s/p radiation treatment. - (PT. DENIES)Has indwelling foley.   . Arthritis     Past Surgical History  Procedure Laterality Date  . Transurethral microwave therapy  10/15/2012  . Nasal fracture surgery  1970's  . Tracheostomy  07/2012  . Tracheostomy closure  08/2012  . Anal fissure repair  2008  . Coronary artery bypass graft N/A 10/25/2012    Procedure: CORONARY ARTERY BYPASS GRAFTING (CABG);  Surgeon: Melrose Nakayama, MD;  Location: [place];  Service: Open Heart Surgery;  Laterality: N/A;  times 4 using left internal mammary artery and endoscopically harvested bilateral saphenous vein   . Aortic valve replacement N/A 10/25/2012    Procedure: AORTIC VALVE REPLACEMENT (AVR);  Surgeon: Melrose Nakayama, MD;  Location: [place];  Service: Open Heart Surgery;  Laterality: N/A;  . Intraoperative transesophageal echocardiogram N/A 10/25/2012    Procedure: INTRAOPERATIVE TRANSESOPHAGEAL ECHOCARDIOGRAM;  Surgeon: Melrose Nakayama, MD;  Location: [place];  Service: Open Heart Surgery;   Laterality: N/A;  . Cardiac valve replacement      Arthur Holms, RD, LDN Pager #: [phone] After-Hours Pager #: [phone]

## 2013-07-11 NOTE — Consult Note (Signed)
Name: [name] MRN: [id number] DOB: 03-Jun-1941    ADMISSION DATE:  07/10/2013 CONSULTATION DATE:  1/8  REFERRING MD :  Kellie Simmering  PRIMARY SERVICE: Kellie Simmering   CHIEF COMPLAINT:  Post-op   BRIEF PATIENT DESCRIPTION:  73 year old male w/ multiple medical co-morbids: OSA, CAD, prior CABG and AVR w/ bioprosthetic valve,  s/p resection and grafting of perirenal AAA 1/8. Intra op course notable for estimated 50 minutes clamp time above the renals. Returned to the ICU post-op on the vent. PCCM asked to assist w/ post-op care.   SIGNIFICANT EVENTS / STUDIES:   1/8: s/p resection and grafting of perirenal AAA 1/8  LINES / TUBES: OETT 1/8>>> Left IJ PCA 1/8>>> Left rad-aline 1/8>>>  CULTURES: None ANTIBIOTICS: vanc 1/8>>>1/9   SUBJECTIVE:  Awake on vent  VITAL SIGNS: Temp:  [98.1 F (36.7 C)-101.1 F (38.4 C)] 100.2 F (37.9 C) (01/09 0700) Pulse Rate:  [82-119] 95 (01/09 0700) Resp:  [16-32] 19 (01/09 0700) BP: (82-151)/(50-93) 95/57 mmHg (01/09 0700) SpO2:  [92 %-100 %] 96 % (01/09 0829) Arterial Line BP: (72-174)/(46-82) 105/57 mmHg (01/09 0700) FiO2 (%):  [40 %-50 %] 50 % (01/09 0829) Weight:  [101.77 kg (224 lb 5.8 oz)] 101.77 kg (224 lb 5.8 oz) (01/08 1500) HEMODYNAMICS: PAP: (29-45)/(8-27) 38/24 mmHg CVP:  [4 mmHg-15 mmHg] 11 mmHg CO:  [4.9 L/min-7.4 L/min] 5.1 L/min CI:  [2.4 L/min/m2-3.5 L/min/m2] 2.5 L/min/m2 On mult  pressors  VENTILATOR SETTINGS: Vent Mode:  [-] PSV;CPAP FiO2 (%):  [40 %-50 %] 50 % Set Rate:  [16 bmp] 16 bmp Vt Set:  [500 mL] 500 mL PEEP:  [5 cmH20] 5 cmH20 Pressure Support:  [5 cmH20] 5 cmH20 Plateau Pressure:  [16 cmH20-23 cmH20] 23 cmH20 INTAKE / OUTPUT: Intake/Output     01/08 0701 - 01/09 0700 01/09 0701 - 01/10 0700   I.V. (mL/kg) 7549.7 (74.2)    Blood 3359    NG/GT 60    IV Piggyback 1200    Total Intake(mL/kg) 12168.7 (119.6)    Urine (mL/kg/hr) 810 (0.3)    Emesis/NG output 300 (0.1)    Blood 3600 (1.5)    Total  Output 4710     Net +7458.7            PHYSICAL EXAMINATION: General: agitation at times, pulling at devices Neuro:  Agitated but f/c Moves all ext  HEENT:  Orally intubated. Facial edema  Cardiovascular:  rrr Lungs:  Decreased bases, occ rhonchi, no wheeze  Abdomen:  Mid-abd incision CD&I, hypoactive  Musculoskeletal:  Intact  Skin:  LE edema   LABS:  CBC  Recent Labs Lab 07/04/13 1056 07/10/13 1431 07/11/13 0500  WBC 6.9 15.9* 12.5*  HGB 13.4 14.8 12.8*  HCT 42.6 43.4 39.0  PLT 143* 105* 92*   Coag's  Recent Labs Lab 07/04/13 1056 07/10/13 1431  APTT 29 33  INR 1.04 1.34   BMET  Recent Labs Lab 07/04/13 1056 07/10/13 1431 07/11/13 0500  NA 140 136* 135*  K 3.9 5.4* 4.6  CL 99 102 101  CO2 22 22 22   BUN 41* 31* 41*  CREATININE 1.47* 1.71* 3.07*  GLUCOSE 160* 285* 245*   Electrolytes  Recent Labs Lab 07/04/13 1056 07/10/13 1431 07/11/13 0500  CALCIUM 9.3 7.2* 7.1*  MG  --  1.3* 1.6   Sepsis Markers No results found for this basename: LATICACIDVEN, PROCALCITON, O2SATVEN,  in the last 168 hours ABG  Recent Labs Lab 07/10/13 1447 07/10/13  1647 07/11/13 0401  PHART 7.278* 7.280* 7.327*  PCO2ART 50.2* 51.5* 42.4  PO2ART 75.0* 99.0 65.0*   Liver Enzymes  Recent Labs Lab 07/04/13 1056 07/11/13 0500  AST 25 64*  ALT 28 25  ALKPHOS 123* 71  BILITOT 0.8 1.3*  ALBUMIN 4.0 2.7*   Cardiac Enzymes No results found for this basename: TROPONINI, PROBNP,  in the last 168 hours Glucose  Recent Labs Lab 07/10/13 0559 07/11/13 0052 07/11/13 0359 07/11/13 0804  GLUCAP 137* 272* 207* 159*    Imaging Dg Chest Portable 1 View  07/11/2013   CLINICAL DATA:  Status post abdominal aortic aneurysm repair.  EXAM: PORTABLE CHEST - 1 VIEW  COMPARISON:  07/10/2013  FINDINGS: Endotracheal tube remains with the tip approximately 3.5 cm above the carina. Swan-Ganz catheter present with the tip in the proximal right pulmonary artery. A nasogastric tube  extends below the diaphragm. Lungs show low volumes with stable interstitial and vascular prominence remaining suggestive of mild interstitial edema. No significant pleural fluid is seen. The heart size is stable.  IMPRESSION: Low lung volumes with stable suggestion of mild interstitial edema.   Electronically Signed   By: Aletta Edouard M.D.   On: 07/11/2013 08:03   Dg Chest Portable 1 View  07/10/2013   CLINICAL DATA:  Line placement.  EXAM: PORTABLE CHEST - 1 VIEW  COMPARISON:  07/04/2013  FINDINGS: Sternotomy wires unchanged. There has been placement of an enteric tube which courses through the region of the stomach and off the film as tip is not visualized. An endotracheal tube has been placed with tip approximately 3 cm above the carina. Left IJ Swan-Ganz catheter is present with tip overlying the proximal right lower lobar pulmonary artery. No evidence of pneumothorax.  Lungs are hypoinflated with subtle left basilar opacification likely atelectasis. There is blunting of the right costophrenic angle likely a small amount of pleural fluid. There is stable cardiomegaly. Remainder of the exam is unchanged.  IMPRESSION: Suggestion of a small right pleural effusion. Minimal left basilar opacification suggesting atelectasis.  Tubes and lines as described.   Electronically Signed   By: Marin Olp M.D.   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  EXAM: PORTABLE ABDOMEN - 1 VIEW  COMPARISON:  Preoperative CTA abdomen and pelvis 12/31/2012.  FINDINGS: Midline surgical skin staples. Nasogastric tube in the stomach. Bowel gas pattern unremarkable without evidence of obstruction or significant ileus. No visible free intraperitoneal air on the supine image. No unexpected opaque foreign bodies. No visible opaque urinary tract calculi. Degenerative changes involving the lower lumbar spine.  IMPRESSION: No  acute abdominal abnormality.   Electronically Signed   By: Evangeline Dakin M.D.   On: 07/10/2013 15:18     CXR: ALI pattern ASSESSMENT / PLAN:  PULMONARY A: Acute respiratory failure in setting of multiple metabolic derangements, atelectasis and underlying OSA and COPD.    P:   Full vent support  F/u abg and CXR WUA/SBT >>>failed SBT, on pressors Scheduled BDs   CARDIOVASCULAR A:  S/p resection and grafting of perirenal AAA  1/8  H/o CAD and severe AS s/p CABG and AVR w/ Bioprosthetic valve 4/14, last EF 55-60% 8/14 HTN P:  MAP goal >65 Dopamine 74mcg/min per vascular Use neo for blood pressure support Titrate pressors Give volume with low uop  RENAL A:  CKD stage 3A (BL GFR 46 w/  cr 1.4-1.7). Cr now 3 poor UOP  P:   Renal consult  GASTROINTESTINAL A:  Post op ileus P:   NPO NGT to LIWS ppi   HEMATOLOGIC A:   Blood loss anemia s/p AAA, acute on chronic. Baseline hgb 12-13 range. Got 4 units PRBC and 4 units FFP intra/post-op  Hgb 12. 1/9 Mild thrombocytopenia at baseline  P:  F/u cbc and INR  PPI Hold off on anticoagulation for now   INFECTIOUS A:  No acute infection  P:   See flow sheet D/c vanco  ENDOCRINE A:  DM w/ hyperglycemia  P:   Hyperglycemia protocol   NEUROLOGIC A:  Acute encephalopathy: currently in setting of residual anesthesia +/- residual metabolic derangements  P:   PAD protocol with Precedex/fentanyl Supportive care   .    TODAY'S SUMMARY: keep on vent for now, ensure adequate perfusion, get renal eval.  I have personally obtained a history, examined the patient, evaluated laboratory and imaging results, formulated the assessment and plan and placed orders. CRITICAL CARE: The patient is critically ill with multiple organ systems failure and requires high complexity decision making for assessment and support, frequent evaluation and titration of therapies, application of advanced monitoring technologies and extensive  interpretation of multiple databases. Critical Care Time devoted to patient care services described in this note is 40 minutes.   Mariel Sleet Beeper  [phone]  Cell  [phone]  If no response or cell goes to voicemail, call beeper [phone]  07/11/2013, 9:12 AM

## 2013-07-11 NOTE — Progress Notes (Signed)
OT Cancellation Note  Patient Details Name: [name] MRN: [id number] DOB: [birth year]   Cancelled Treatment:    Reason Eval/Treat Not Completed: Medical issues which prohibited therapy - pt intubated and sedated.  Will check back next week.   [place], OTR/L [id number]  07/11/2013, 1:40 PM

## 2013-07-11 NOTE — Progress Notes (Signed)
  Echocardiogram 2D Echocardiogram has been performed.  [name] 07/11/2013, 2:24 PM

## 2013-07-11 NOTE — Progress Notes (Addendum)
[facility] ICU Progress Note  CC:  Intubated.  Answers no when asked if his belly hurt.  CV:    A999333 systolic HR  [id number] regular  Gtts: Neo 75 mcg/min Precedex  Dopamine off last night at 6pm  Filed Vitals:   07/11/13 0700  BP: 95/57  Pulse: 95  Temp: 100.2 F (37.9 C)  Resp: 19    LUNGS:  intubated 95% .50 FiO2 ABG    Component Value Date/Time   PHART 7.327* 07/11/2013 0401   PCO2ART 42.4 07/11/2013 0401   PO2ART 65.0* 07/11/2013 0401   HCO3 21.8 07/11/2013 0401   TCO2 23 07/11/2013 0401   ACIDBASEDEF 4.0* 07/11/2013 0401   O2SAT 89.0 07/11/2013 0401   chest X-ray  NEURO:   Awake; following commands; moves all extremities  RENAL:  Intake/Output Summary (Last 24 hours) at 07/11/13 0726 Last data filed at 07/11/13 0600  Gross per 24 hour  Intake 11955.61 ml  Output   4710 ml  Net 7245.61 ml   No data found.  BMET    Component Value Date/Time   NA 135* 07/11/2013 0500   K 4.6 07/11/2013 0500   CL 101 07/11/2013 0500   CO2 22 07/11/2013 0500   GLUCOSE 245* 07/11/2013 0500   BUN 41* 07/11/2013 0500   CREATININE 3.07* 07/11/2013 0500   CREATININE 4.17* 01/02/2013 1618   CALCIUM 7.1* 07/11/2013 0500   GFRNONAA 19* 07/11/2013 0500   GFRAA 22* 07/11/2013 0500   CMP     Component Value Date/Time   NA 135* 07/11/2013 0500   K 4.6 07/11/2013 0500   CL 101 07/11/2013 0500   CO2 22 07/11/2013 0500   GLUCOSE 245* 07/11/2013 0500   BUN 41* 07/11/2013 0500   CREATININE 3.07* 07/11/2013 0500   CREATININE 4.17* 01/02/2013 1618   CALCIUM 7.1* 07/11/2013 0500   PROT 5.1* 07/11/2013 0500   ALBUMIN 2.7* 07/11/2013 0500   AST 64* 07/11/2013 0500   ALT 25 07/11/2013 0500   ALKPHOS 71 07/11/2013 0500   BILITOT 1.3* 07/11/2013 0500   GFRNONAA 19* 07/11/2013 0500   GFRAA 22* 07/11/2013 0500    HEME/ID:   Tm 101.1 Now 100.2 . CBC    Component Value Date/Time   WBC 12.5* 07/11/2013 0500   RBC 4.33 07/11/2013 0500   HGB 12.8* 07/11/2013 0500   HCT 39.0 07/11/2013 0500   PLT 92* 07/11/2013 0500    MCV 90.1 07/11/2013 0500   MCH 29.6 07/11/2013 0500   MCHC 32.8 07/11/2013 0500   RDW 16.0* 07/11/2013 0500   LYMPHSABS 0.7 03/14/2013 1118   MONOABS 0.6 03/14/2013 1118   EOSABS 0.3 03/14/2013 1118   BASOSABS 0.0 03/14/2013 1118    ABDOMEN: Distended; -BS Laparotomy Incision is c/d/i  EXTREMITIES: Bilateral feet are cool +PT doppler signal bilaterally   A/P: 73 y.o. male who is s/p Resection and Graftiong of perirenal AAA; Insertion 14 x 8 Hemashield Graft Aorta to Left Common Iliac and to Right Common Femoral Artery With Ligastion of Right External and Interanl Iliac Artery to treat large right common iliac artery aneurysm  POD 1  -UOP has dropped off since  around 9pm last night and is only making 5-10 cc/hr.  His creatinine is 3.07 this morning, which is up from pre op at 1.4 and post op at 1.7.  Given this, will get a renal consult.  He does have a hx of renal failure last year with a creatinine at 4.73 01/03/13. -acute surgical blood loss  anemia-stable.  He did receive 4 units PRBC's yesterday as well as cell saver. -wean vent per CCM -[facility] cardiology was consulted to follow as pt has extensive cardiac issues. -continue NPO -leukocytosis-continue to monitor.   Leontine Locket, PA-C [facility] [phone] Agree with above assessment On exam patient is alert and follows commands. Does have bilateral scleral edema. Both lower extremities have slight mottling but excellent PT flow bilaterally. Abdomen distended-obese-nasogastric 400 cc past shift  #1 vascular-excellent flow to lower trimming these doing well from that standpoint #2 renal-creatinine increased to 3.07 and urine output 10-15 cc per hour. Patient had approximately 1 hour of renal ischemia time to deal with the aneurysm which extended up to and involved the origin of the left renal artery. Excellent Doppler flow in both renal arteries at conclusion of procedure. Patient does have history of ATN with previous  pneumonia and cardiac surgery last year.-Agree with renal consult today-Dr. Joya Gaskins will notify renal  #3 pulmonary-stable on the ventilator-no plans to wean until renal status established and clarified #4 hematology-hematocrit 39%. Patient received 4 units of packed red blood cells and 1500 cc cell saver during procedure-probably slightly over transfused to maintain adequate pressure-platelet count 95,000 #5 cardiac-blood pressure running in the [id number] systolic range on Neo-Synephrine drip-75 mcg per minute and dopamine-5 mcg per kilogram per minute. Would be good to wean D.O. if possible to help with renal function.  In general patient is stable and progressing satisfactorily. Hopefully renal function will stabilize and improve and urine output will respond. Agree with continuing ventilatory support until renal status established

## 2013-07-11 NOTE — Progress Notes (Signed)
Utilization review completed. Euel Castile, RN, BSN. 

## 2013-07-11 NOTE — Consult Note (Signed)
[facility]  Patient name: [name] Medical record number: [id number] Date of birth: 28-Nov-1940 Age: 73 y.o. Gender: male  Primary Care Provider: [name], MD Consulted: Acute Renal Failure   History of Present Illness: [name] is a 73 y.o. male with a history of ARF In the past after receiving contrast.per chart.  We are consulted today to manage his Acute renal failure after AAA repair on 1/8. Patient has had a baseline Scr ~1.5 in the past. Total UOP yesterday dropped to 810 ml. This admission he has experienced p-op anemia with replacement of 4 units of blood yesterday. His blood pressure has also been on the low side (90's/60's) yesterday. Patient has been febrile with Tmax 101.1 through the night on vancomycin.  In addition his past medical history included CHF, CAD, HTN, and DM.  Past Medical History: Past Medical History  Diagnosis Date  . Hypertension   . Hyperlipidemia   . Prediabetes   . CHF (congestive heart failure) 07/2012; 10/17/2012  . COPD (chronic obstructive pulmonary disease)     a.  History of heavy smoking. PFTs (4/14) with mixed obstructive (COPD) and restrictive (post-ARDS) picture.   . Pneumonia 07/2012    PNA (H1N1 influenza + pneumococcus) in [id number] complicated by respiratory failure/ARDS. Required tracheostomy, now weaned off. Had left empyema requiring chest tube.   . Diabetes mellitus without complication   . History of blood transfusion     "lots since January" (10/17/2012)  . Ischemic cardiomyopathy   . Anemia of chronic disease   . Aortic stenosis     Moderate to severe by echo in 4/14. Bioprosthetic #21 [facility] Ease aortic valve replacement in 4/14.   Marland Kitchen CAD (coronary artery disease)     a. 4/14 CABG: LIMA-LAD, seq SVG-ramus & OM1, SVG-PDA  . Carotid arterial disease     Carotid dopplers (0000000) with [id number] LICA stenosis.   Marland Kitchen AAA (abdominal aortic aneurysm)     5/14 CT showed > 6 cm AAA, also right iliac aneurysm. Not  stent graft candidate.   . Esophageal reflux   . Ischemic cardiomyopathy     a. 4/14 LHC: pLAD 95, ost ramus 70-80, mLCx 90, EF 30%  . Chronic systolic heart failure     a. 1/14 ECHO: sev dil LV, EF30-35%, diff HK, mild MR, AS severe, AV grad 35 b. 8/14 ECHO: EF 55-60%, mild biopros AV sten mn grad. 25, RV mild dil, RA mild dil  . Complication of anesthesia     during last surgery had to be given special medicine b/c "something dropped" during surgery   . Anxiety     pt. admits that he has anxiety at times   . Shortness of breath     still goes deer hunting by himself  . OSA (obstructive sleep apnea)     on cpap- every sleep time.   . Chronic kidney disease     increased creatinine recently - being followed, near kidney failure fr. contrast dye   . Prostate cancer      s/p radiation treatment. - (PT. DENIES)Has indwelling foley.   . Arthritis    Past Surgical History: Past Surgical History  Procedure Laterality Date  . Transurethral microwave therapy  10/15/2012  . Nasal fracture surgery  1970's  . Tracheostomy  07/2012  . Tracheostomy closure  08/2012  . Anal fissure repair  2008  . Coronary artery bypass graft N/A 10/25/2012    Procedure: CORONARY ARTERY BYPASS GRAFTING (CABG);  Surgeon:  Melrose Nakayama, MD;  Location: [place];  Service: Open Heart Surgery;  Laterality: N/A;  times 4 using left internal mammary artery and endoscopically harvested bilateral saphenous vein   . Aortic valve replacement N/A 10/25/2012    Procedure: AORTIC VALVE REPLACEMENT (AVR);  Surgeon: Melrose Nakayama, MD;  Location: [place];  Service: Open Heart Surgery;  Laterality: N/A;  . Intraoperative transesophageal echocardiogram N/A 10/25/2012    Procedure: INTRAOPERATIVE TRANSESOPHAGEAL ECHOCARDIOGRAM;  Surgeon: Melrose Nakayama, MD;  Location: [place];  Service: Open Heart Surgery;  Laterality: N/A;  . Cardiac valve replacement     Allergies and Medications: Allergies  Allergen Reactions  .  Omnipaque [Iohexol] Other (See Comments)    Decreased kidney function  . Cephalosporins Rash    Blisters   No current facility-administered medications on file prior to encounter.   Current Outpatient Prescriptions on File Prior to Encounter  Medication Sig Dispense Refill  . aspirin EC 325 MG EC tablet Take 1 tablet (325 mg total) by mouth daily.  30 tablet    . budesonide (PULMICORT) 0.25 MG/2ML nebulizer solution Take 2 mLs (0.25 mg total) by nebulization 2 (two) times daily. Dx 496  120 mL  12  . clonazePAM (KLONOPIN) 0.5 MG tablet Take 1 tablet (0.5 mg total) by mouth 2 (two) times daily as needed (anxiety).  60 tablet  3  . ipratropium-albuterol (DUONEB) 0.5-2.5 (3) MG/3ML SOLN Take 3 mLs by nebulization every 6 (six) hours as needed.  360 mL  5  . metolazone (ZAROXOLYN) 2.5 MG tablet Take 2.5 mg 30 min prior to Lasix on Wed. 12/3 and Sat. 12/6 and then every Wednesday.  30 tablet  3  . nitroGLYCERIN (NITROSTAT) 0.4 MG SL tablet Place 1 tablet (0.4 mg total) under the tongue every 5 (five) minutes as needed for chest pain.  25 tablet  12  . pantoprazole (PROTONIX) 40 MG tablet Take 40 mg by mouth at bedtime.       . potassium chloride (KLOR-CON 10) 10 MEQ tablet Take 4 tablets (40 mEq total) by mouth 2 (two) times daily.  240 tablet  5  . sitaGLIPtin (JANUVIA) 100 MG tablet Take 100 mg by mouth daily before breakfast.       . tamsulosin (FLOMAX) 0.4 MG CAPS capsule Take 0.4 mg by mouth at bedtime.         Objective: BP 119/67  Pulse 97  Temp(Src) 99.5 F (37.5 C) (Core (Comment))  Resp 25  Ht 5' 5.5" (1.664 m)  Wt 224 lb 5.8 oz (101.77 kg)  BMI 36.75 kg/m2  SpO2 98%  Intake/Output Summary (Last 24 hours) at 07/11/13 1152 Last data filed at 07/11/13 0900  Gross per 24 hour  Intake 7980.63 ml  Output   1940 ml  Net 6040.63 ml    Exam: General: intubated. Sedated. Diaphoretic.  Cardiovascular: RRR.  Respiratory: Intubated. Diffuse coarse rhonchi. Abdomen: Soft.  Distended. BS hypoactive.  Incision C/D/I.  Extremities: No erythema, No edema. Moving arms. SCD's in place and functioning.   Labs and Imaging:   Recent Labs Lab 07/10/13 1431 07/11/13 0500  NA 136* 135*  K 5.4* 4.6  CL 102 101  CO2 22 22  GLUCOSE 285* 245*  BUN 31* 41*  CREATININE 1.71* 3.07*  CALCIUM 7.2* 7.1*  MG 1.3* 1.6    Recent Labs Lab 07/10/13 1431 07/11/13 0500  HGB 14.8 12.8*  HCT 43.4 39.0  WBC 15.9* 12.5*  PLT 105* 92*    Dg  Chest Portable 1 View  07/11/2013   CLINICAL DATA:  Status post abdominal aortic aneurysm repair.  EXAM: PORTABLE CHEST - 1 VIEW  COMPARISON:  07/10/2013  FINDINGS: Endotracheal tube remains with the tip approximately 3.5 cm above the carina. Swan-Ganz catheter present with the tip in the proximal right pulmonary artery. A nasogastric tube extends below the diaphragm. Lungs show low volumes with stable interstitial and vascular prominence remaining suggestive of mild interstitial edema. No significant pleural fluid is seen. The heart size is stable.  IMPRESSION: Low lung volumes with stable suggestion of mild interstitial edema.   Electronically Signed   By: Aletta Edouard M.D.   On: 07/11/2013 08:03   Dg Chest Portable 1 View  07/10/2013   CLINICAL DATA:  Line placement.  EXAM: PORTABLE CHEST - 1 VIEW  COMPARISON:  07/04/2013  FINDINGS: Sternotomy wires unchanged. There has been placement of an enteric tube which courses through the region of the stomach and off the film as tip is not visualized. An endotracheal tube has been placed with tip approximately 3 cm above the carina. Left IJ Swan-Ganz catheter is present with tip overlying the proximal right lower lobar pulmonary artery. No evidence of pneumothorax.  Lungs are hypoinflated with subtle left basilar opacification likely atelectasis. There is blunting of the right costophrenic angle likely a small amount of pleural fluid. There is stable cardiomegaly. Remainder of the exam is unchanged.   IMPRESSION: Suggestion of a small right pleural effusion. Minimal left basilar opacification suggesting atelectasis.  Tubes and lines as described.   Electronically Signed   By: Marin Olp M.D.   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  EXAM: PORTABLE ABDOMEN - 1 VIEW  COMPARISON:  Preoperative CTA abdomen and pelvis 12/31/2012.  FINDINGS: Midline surgical skin staples. Nasogastric tube in the stomach. Bowel gas pattern unremarkable without evidence of obstruction or significant ileus. No visible free intraperitoneal air on the supine image. No unexpected opaque foreign bodies. No visible opaque urinary tract calculi. Degenerative changes involving the lower lumbar spine.  IMPRESSION: No acute abdominal abnormality.   Electronically Signed   By: Evangeline Dakin M.D.   On: 07/10/2013 15:18   Assessment: 1.) p-op day #1 s/p AAA repair 2.) Acute  On chronic renal failure s/p AAA repair d/t hypotension, cross clamping of renal arteries causing ischemic changes. Baseline Scr ~1.5 in the past, with ARF history with Scr of 4.73. Pt receiving lasix and vanc. ~100cc dark yellow urine in foley bag.  3.) CHF 4.) DM 5.) CAD  Plan:  1.) Monitor renal function daily 2.) Monitor BP closely 3.) Strict I/O 4.) DC IVF 5.) Start lasix 160 mg IV TID 6.) Urine: UA,  Na and Cr ordered.     Ma Hillock, DO 07/11/2013, 11:27 AM PGY-2 I have seen and examined this patient and agree with plan per Dr Raoul Pitch.  74 yo WM with CKD and now SP AAA repair with cross clamping proximal to renal arteries and some post op hypotension.  Acute on CKD sec ischemic ATN from hypotension and cross clamping.  BAseline Scr 1.5 and now 3 with decreasing UO.  On neo for BP support and BP in 110-120 range.  + Fluid balance.  CVP 11.  Since BP is better, will Dc IV at 125cc/hr and try to stimulate UO with IV lasix.  Will check  urine studies  and follow Scr daily. Kelyn Koskela T,MD 07/11/2013 12:35 PM

## 2013-07-12 ENCOUNTER — Inpatient Hospital Stay (HOSPITAL_COMMUNITY): Payer: Medicare Other

## 2013-07-12 DIAGNOSIS — I509 Heart failure, unspecified: Secondary | ICD-10-CM

## 2013-07-12 DIAGNOSIS — I5033 Acute on chronic diastolic (congestive) heart failure: Secondary | ICD-10-CM

## 2013-07-12 LAB — CBC WITH DIFFERENTIAL/PLATELET
BASOS ABS: 0 10*3/uL (ref 0.0–0.1)
BASOS PCT: 0 % (ref 0–1)
Eosinophils Absolute: 0.1 10*3/uL (ref 0.0–0.7)
Eosinophils Relative: 1 % (ref 0–5)
HCT: 33.9 % — ABNORMAL LOW (ref 39.0–52.0)
HEMOGLOBIN: 11.2 g/dL — AB (ref 13.0–17.0)
LYMPHS PCT: 3 % — AB (ref 12–46)
Lymphs Abs: 0.4 10*3/uL — ABNORMAL LOW (ref 0.7–4.0)
MCH: 29.8 pg (ref 26.0–34.0)
MCHC: 33 g/dL (ref 30.0–36.0)
MCV: 90.2 fL (ref 78.0–100.0)
Monocytes Absolute: 0.9 10*3/uL (ref 0.1–1.0)
Monocytes Relative: 7 % (ref 3–12)
NEUTROS ABS: 11.8 10*3/uL — AB (ref 1.7–7.7)
Neutrophils Relative %: 90 % — ABNORMAL HIGH (ref 43–77)
Platelets: 75 10*3/uL — ABNORMAL LOW (ref 150–400)
RBC: 3.76 MIL/uL — ABNORMAL LOW (ref 4.22–5.81)
RDW: 15.9 % — AB (ref 11.5–15.5)
WBC: 13.2 10*3/uL — ABNORMAL HIGH (ref 4.0–10.5)

## 2013-07-12 LAB — GLUCOSE, CAPILLARY
GLUCOSE-CAPILLARY: 152 mg/dL — AB (ref 70–99)
Glucose-Capillary: 103 mg/dL — ABNORMAL HIGH (ref 70–99)
Glucose-Capillary: 124 mg/dL — ABNORMAL HIGH (ref 70–99)
Glucose-Capillary: 136 mg/dL — ABNORMAL HIGH (ref 70–99)
Glucose-Capillary: 140 mg/dL — ABNORMAL HIGH (ref 70–99)
Glucose-Capillary: 152 mg/dL — ABNORMAL HIGH (ref 70–99)

## 2013-07-12 LAB — COMPREHENSIVE METABOLIC PANEL
ALT: 29 U/L (ref 0–53)
AST: 59 U/L — ABNORMAL HIGH (ref 0–37)
Albumin: 2.5 g/dL — ABNORMAL LOW (ref 3.5–5.2)
Alkaline Phosphatase: 67 U/L (ref 39–117)
BUN: 51 mg/dL — AB (ref 6–23)
CALCIUM: 7 mg/dL — AB (ref 8.4–10.5)
CO2: 18 mEq/L — ABNORMAL LOW (ref 19–32)
Chloride: 95 mEq/L — ABNORMAL LOW (ref 96–112)
Creatinine, Ser: 5.15 mg/dL — ABNORMAL HIGH (ref 0.50–1.35)
GFR, EST AFRICAN AMERICAN: 12 mL/min — AB (ref >90)
GFR, EST NON AFRICAN AMERICAN: 10 mL/min — AB (ref >90)
GLUCOSE: 166 mg/dL — AB (ref 70–99)
Potassium: 4.8 mEq/L (ref 3.7–5.3)
Sodium: 130 mEq/L — ABNORMAL LOW (ref 137–147)
TOTAL PROTEIN: 5.4 g/dL — AB (ref 6.0–8.3)
Total Bilirubin: 1 mg/dL (ref 0.3–1.2)

## 2013-07-12 MED ORDER — IPRATROPIUM BROMIDE 0.02 % IN SOLN: 0.5000 mg | RESPIRATORY_TRACT | Status: DC | PRN

## 2013-07-12 MED ORDER — ARFORMOTEROL TARTRATE 15 MCG/2ML IN NEBU
15.0000 ug | INHALATION_SOLUTION | Freq: Two times a day (BID) | RESPIRATORY_TRACT | Status: DC
  Administered 2013-07-12 – 2013-07-25 (×26): 15 ug via RESPIRATORY_TRACT
  Filled 2013-07-12 (×30): qty 2

## 2013-07-12 MED ORDER — PHENYLEPHRINE HCL 10 MG/ML IJ SOLN
30.0000 ug/min | INTRAVENOUS | Status: DC
  Administered 2013-07-12: 30 ug/min via INTRAVENOUS
  Administered 2013-07-13 – 2013-07-15 (×3): 15 ug/min via INTRAVENOUS
  Filled 2013-07-12 (×4): qty 4

## 2013-07-12 MED ORDER — CHLORHEXIDINE GLUCONATE CLOTH 2 % EX PADS
6.0000 | MEDICATED_PAD | Freq: Every day | CUTANEOUS | Status: AC
  Administered 2013-07-13 – 2013-07-16 (×5): 6 via TOPICAL

## 2013-07-12 MED ORDER — BUDESONIDE 0.5 MG/2ML IN SUSP
0.5000 mg | Freq: Two times a day (BID) | RESPIRATORY_TRACT | Status: DC
  Administered 2013-07-12 – 2013-07-25 (×27): 0.5 mg via RESPIRATORY_TRACT
  Filled 2013-07-12 (×29): qty 2

## 2013-07-12 MED ORDER — ALBUTEROL SULFATE (2.5 MG/3ML) 0.083% IN NEBU: 2.5000 mg | INHALATION_SOLUTION | RESPIRATORY_TRACT | Status: DC | PRN

## 2013-07-12 NOTE — Progress Notes (Signed)
Pt increasingly more tachycardic with HR now in 110's and increasingly more febrile with a core temp >101F.  Dr Elsworth Soho notified of changes at 1600.  Pt given prn Acetaminophen 650mg  suppository.  Orders for respiratory culture received and sample sent to lab at 1700.  Will continue to monitor.

## 2013-07-12 NOTE — Progress Notes (Signed)
Name: [name] MRN: [id number] DOB: 1941/01/02    ADMISSION DATE:  07/10/2013 CONSULTATION DATE:  1/8  REFERRING MD :  Kellie Simmering   CHIEF COMPLAINT:  Post-op   BRIEF PATIENT DESCRIPTION:  73 year old male w/ multiple medical co-morbids: OSA, CAD, prior CABG and AVR w/ bioprosthetic valve,  s/p resection and grafting of perirenal AAA 1/8. Intra op course notable for estimated 50 minutes clamp time above the renals. Returned to the ICU post-op on the vent. PCCM asked to assist w/ post-op care.   SIGNIFICANT EVENTS / STUDIES:   1/8: s/p resection and grafting of perirenal AAA 1/8  LINES / TUBES: OETT 1/8>>> Left IJ PCA 1/8>>> Left rad-aline 1/8>>>  CULTURES: Urine 1/9 >>>>  ANTIBIOTICS: vanc 1/8>>>1/9  SUBJECTIVE:  Tolerates some pressure support.  Remains on pressors.  Minimal urine outpt.  VITAL SIGNS: Temp:  [99.5 F (37.5 C)-100.8 F (38.2 C)] 100.6 F (38.1 C) (01/10 0800) Pulse Rate:  [95-118] 107 (01/10 0800) Resp:  [19-36] 31 (01/10 0800) BP: (81-125)/(42-72) 104/55 mmHg (01/10 0800) SpO2:  [89 %-100 %] 98 % (01/10 0849) Arterial Line BP: (79-148)/(40-75) 110/54 mmHg (01/10 0800) FiO2 (%):  [40 %-50 %] 40 % (01/10 0849) Weight:  [253 lb 15.5 oz (115.2 kg)] 253 lb 15.5 oz (115.2 kg) (01/10 0400) HEMODYNAMICS: PAP: (29-58)/(2-28) 34/12 mmHg CVP:  [2 mmHg-18 mmHg] 4 mmHg CO:  [7 L/min] 7 L/min CI:  [3.4 L/min/m2] 3.4 L/min/m2 VENTILATOR SETTINGS: Vent Mode:  [-] PRVC FiO2 (%):  [40 %-50 %] 40 % Set Rate:  [16 bmp] 16 bmp Vt Set:  [500 mL] 500 mL PEEP:  [5 cmH20] 5 cmH20 INTAKE / OUTPUT: Intake/Output     01/09 0701 - 01/10 0700 01/10 0701 - 01/11 0700   I.V. (mL/kg) 2288.5 (19.9) 61.8 (0.5)   Blood     Other 220    NG/GT 150 30   IV Piggyback 266    Total Intake(mL/kg) 2924.5 (25.4) 91.8 (0.8)   Urine (mL/kg/hr) 368 (0.1) 20 (0.1)   Emesis/NG output 525 (0.2)    Blood     Total Output 893 20   Net +2031.5 +71.8          PHYSICAL  EXAMINATION: General: ill appearing Neuro: RASS -1, opens eyes with stimulation HEENT: ETT in place Cardiovascular: regular, tachycardic Lungs: scattered rhonchi Abdomen: soft, wound site clean Musculoskeletal: 1+ edema Skin: lower extremities cool  LABS:  CBC  Recent Labs Lab 07/10/13 1431 07/11/13 0500 07/12/13 0338  WBC 15.9* 12.5* 13.2*  HGB 14.8 12.8* 11.2*  HCT 43.4 39.0 33.9*  PLT 105* 92* 75*   Coag's  Recent Labs Lab 07/10/13 1431  APTT 33  INR 1.34   BMET  Recent Labs Lab 07/10/13 1431 07/11/13 0500 07/12/13 0338  NA 136* 135* 130*  K 5.4* 4.6 4.8  CL 102 101 95*  CO2 22 22 18*  BUN 31* 41* 51*  CREATININE 1.71* 3.07* 5.15*  GLUCOSE 285* 245* 166*   Electrolytes  Recent Labs Lab 07/10/13 1431 07/11/13 0500 07/11/13 1300 07/12/13 0338  CALCIUM 7.2* 7.1*  --  7.0*  MG 1.3* 1.6  --   --   PHOS  --   --  3.9  --    ABG  Recent Labs Lab 07/10/13 1447 07/10/13 1647 07/11/13 0401  PHART 7.278* 7.280* 7.327*  PCO2ART 50.2* 51.5* 42.4  PO2ART 75.0* 99.0 65.0*   Liver Enzymes  Recent Labs Lab 07/11/13 0500 07/12/13 [id number]  AST 64* 59*  ALT 25 29  ALKPHOS 71 67  BILITOT 1.3* 1.0  ALBUMIN 2.7* 2.5*   Glucose  Recent Labs Lab 07/11/13 1145 07/11/13 1602 07/11/13 1959 07/12/13 0044 07/12/13 0329 07/12/13 0821  GLUCAP 155* 128* 126* 136* 152* 140*    Imaging Dg Chest Port 1 View  07/12/2013   CLINICAL DATA:  Status post repair of abdominal aortic aneurysm.  EXAM: PORTABLE CHEST - 1 VIEW  COMPARISON:  07/11/2013  FINDINGS: Endotracheal tube remains with the tip approximately 3 cm above the carina. Swan-Ganz catheter tip is in the proximal right pulmonary artery. Bibasilar atelectasis present. No pneumothorax. Suggestion of mild interstitial edema is relatively stable. No significant pleural fluid is identified. There is stable cardiomegaly.  IMPRESSION: Bibasilar atelectasis. Stable suggestion of mild interstitial edema.    Electronically Signed   By: Aletta Edouard M.D.   On: 07/12/2013 08:40   Dg Chest Portable 1 View  07/11/2013   CLINICAL DATA:  Status post abdominal aortic aneurysm repair.  EXAM: PORTABLE CHEST - 1 VIEW  COMPARISON:  07/10/2013  FINDINGS: Endotracheal tube remains with the tip approximately 3.5 cm above the carina. Swan-Ganz catheter present with the tip in the proximal right pulmonary artery. A nasogastric tube extends below the diaphragm. Lungs show low volumes with stable interstitial and vascular prominence remaining suggestive of mild interstitial edema. No significant pleural fluid is seen. The heart size is stable.  IMPRESSION: Low lung volumes with stable suggestion of mild interstitial edema.   Electronically Signed   By: Aletta Edouard M.D.   On: 07/11/2013 08:03   Dg Chest Portable 1 View  07/10/2013   CLINICAL DATA:  Line placement.  EXAM: PORTABLE CHEST - 1 VIEW  COMPARISON:  07/04/2013  FINDINGS: Sternotomy wires unchanged. There has been placement of an enteric tube which courses through the region of the stomach and off the film as tip is not visualized. An endotracheal tube has been placed with tip approximately 3 cm above the carina. Left IJ Swan-Ganz catheter is present with tip overlying the proximal right lower lobar pulmonary artery. No evidence of pneumothorax.  Lungs are hypoinflated with subtle left basilar opacification likely atelectasis. There is blunting of the right costophrenic angle likely a small amount of pleural fluid. There is stable cardiomegaly. Remainder of the exam is unchanged.  IMPRESSION: Suggestion of a small right pleural effusion. Minimal left basilar opacification suggesting atelectasis.  Tubes and lines as described.   Electronically Signed   By: Marin Olp M.D.   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  EXAM: PORTABLE ABDOMEN - 1 VIEW   COMPARISON:  Preoperative CTA abdomen and pelvis 12/31/2012.  FINDINGS: Midline surgical skin staples. Nasogastric tube in the stomach. Bowel gas pattern unremarkable without evidence of obstruction or significant ileus. No visible free intraperitoneal air on the supine image. No unexpected opaque foreign bodies. No visible opaque urinary tract calculi. Degenerative changes involving the lower lumbar spine.  IMPRESSION: No acute abdominal abnormality.   Electronically Signed   By: Evangeline Dakin M.D.   On: 07/10/2013 15:18   ASSESSMENT / PLAN:  PULMONARY A:  Acute respiratory failure 2nd to shock, metabolic derangements, atelectasis. Hx of OSA, COPD. P:   -pressure support wean as tolerated >> not ready for extubation yet -f/u CXR -change to brovana, pulmicort with prn albuterol/ipratropium  CARDIOVASCULAR A:  S/p resection  and grafting of perirenal AAA  1/8 . Hx of CAD and severe AS s/p CABG and AVR w/ Bioprosthetic valve 4/14, last EF 55-60% 8/14. Hx of HTN. P:  -wean of pressors per primary team  RENAL A:  Acute kidney injury. Hx of CKD stage 3A. P:   -lasix per renal >> concerned he may need dialysis  GASTROINTESTINAL A:   Post op ileus. Nutrition. Hx of GERD. P:   -nutrition per [facility] -protonix for SUP  HEMATOLOGIC A:   Acute blood loss anemia after AAA repair. Thrombocytopenia. P:  -f/u CBC -SCD's for DVT prevention  INFECTIOUS A:   No acute infection. P:   -monitor clinically  ENDOCRINE A:   DM w/ hyperglycemia  P:   -SSI  NEUROLOGIC A:   Acute encephalopathy 2nd to shock, renal failure, respiratory failure.  P:   -sedation protocol >> goal RASS -1   CC time 35 minutes.  Chesley Mires, MD [facility] Pulmonary/Critical Care 07/12/2013, 9:28 AM Pager:  [phone] After 3pm call: [phone]

## 2013-07-12 NOTE — Progress Notes (Addendum)
    Subjective  - POD #2  Remains intubated.  Will arouse and follow commands.   Physical Exam:  Intubated, sedated Abdomen is tender but soft. Incisions are healing nicely.       Assessment/Plan:  POD #2, status post open aneurysm repair  Renal: The patient is making 15-20 cc of urine per hour.  His creatinine has increased to 5 today.  Nephrology is following.  Appreciate their assistance Pulmonary: CCM is managing the ventilator.  The patient does respond to commands.  Hopefully the patient's pulmonary status will enable him to be extubated soon. Acute blood loss anemia: The patient's hematocrit has remained stable postoperatively.  No indication for transfusion currently Cardiovascular: Remains on Neo.  We'll try to wean today Prophylaxis: Okay to start Lovenox prophylaxis today.  Continue with SCDs GI: NG tube put out approximately 500 cc yesterday.  We'll continue with NG tube decompression, and n.p.o.  Myles Tavella IV, [name] 07/12/2013 7:57 AM --  Filed Vitals:   07/12/13 0700  BP: 102/54  Pulse: 110  Temp: 100.4 F (38 C)  Resp: 26    Intake/Output Summary (Last 24 hours) at 07/12/13 0757 Last data filed at 07/12/13 0730  Gross per 24 hour  Intake 2954.5 ml  Output    893 ml  Net 2061.5 ml     Laboratory CBC    Component Value Date/Time   WBC 13.2* 07/12/2013 0338   HGB 11.2* 07/12/2013 0338   HCT 33.9* 07/12/2013 0338   PLT 75* 07/12/2013 0338    BMET    Component Value Date/Time   NA 130* 07/12/2013 0338   K 4.8 07/12/2013 0338   CL 95* 07/12/2013 0338   CO2 18* 07/12/2013 0338   GLUCOSE 166* 07/12/2013 0338   BUN 51* 07/12/2013 0338   CREATININE 5.15* 07/12/2013 0338   CREATININE 4.17* 01/02/2013 1618   CALCIUM 7.0* 07/12/2013 0338   GFRNONAA 10* 07/12/2013 0338   GFRAA 12* 07/12/2013 0338    COAG Lab Results  Component Value Date   INR 1.34 07/10/2013   INR 1.04 07/04/2013   INR 1.13 11/04/2012   No results found for this basename: PTT     Antibiotics Anti-infectives   Start     Dose/Rate Route Frequency Ordered Stop   07/10/13 2000  vancomycin (VANCOCIN) IVPB 1000 mg/200 mL premix     1,000 mg 200 mL/hr over 60 Minutes Intravenous Every 12 hours 07/10/13 1431 07/11/13 0900   07/09/13 1249  vancomycin (VANCOCIN) 1,500 mg in sodium chloride 0.9 % 500 mL IVPB     1,500 mg 250 mL/hr over 120 Minutes Intravenous 120 min pre-op 07/09/13 1249 07/10/13 0735       V. Leia Alf, M.D. [facility] Office: [phone] Pager:  [phone]

## 2013-07-12 NOTE — Progress Notes (Signed)
Respiratory therapy note-Increased VE and Respiratory rate, placed back to full support.

## 2013-07-12 NOTE — Progress Notes (Signed)
[facility] Progress Note    Subjective:   Intubated / sedated but arouses to speech and follows simple commands.   Objective:   BP 104/55  Pulse 107  Temp(Src) 100.6 F (38.1 C) (Core (Comment))  Resp 31  Ht 5' 5.5" (1.664 m)  Wt 253 lb 15.5 oz (115.2 kg)  BMI 41.61 kg/m2  SpO2 96%  Intake/Output Summary (Last 24 hours) at 07/12/13 [id number] Last data filed at 07/12/13 0800  Gross per 24 hour  Intake 2560.15 ml  Output    748 ml  Net 1812.15 ml   Weight change: 29 lb 9.7 oz (13.43 kg)  Physical Exam: Gen: intubated / sedated but arouses to speech, not acutely distressed CVS: Sl tachy Resp: diffuse coarse breath sounds, not breathing over vent Abd: soft, moderately distended with hypoactive BS; incision with clean / dry / intact dressing Ext: no erythema, minimal ankle edema, moves arms to command Feet cool to touch but no ischemic changes Imaging: Dg Chest Portable 1 View  07/11/2013   CLINICAL DATA:  Status post abdominal aortic aneurysm repair.  EXAM: PORTABLE CHEST - 1 VIEW  COMPARISON:  07/10/2013  FINDINGS: Endotracheal tube remains with the tip approximately 3.5 cm above the carina. Swan-Ganz catheter present with the tip in the proximal right pulmonary artery. A nasogastric tube extends below the diaphragm. Lungs show low volumes with stable interstitial and vascular prominence remaining suggestive of mild interstitial edema. No significant pleural fluid is seen. The heart size is stable.  IMPRESSION: Low lung volumes with stable suggestion of mild interstitial edema.   Electronically Signed   By: Aletta Edouard M.D.   On: 07/11/2013 08:03   Dg Chest Portable 1 View  07/10/2013   CLINICAL DATA:  Line placement.  EXAM: PORTABLE CHEST - 1 VIEW  COMPARISON:  07/04/2013  FINDINGS: Sternotomy wires unchanged. There has been placement of an enteric tube which courses through the region of the stomach and off the film as tip is not visualized. An endotracheal tube  has been placed with tip approximately 3 cm above the carina. Left IJ Swan-Ganz catheter is present with tip overlying the proximal right lower lobar pulmonary artery. No evidence of pneumothorax.  Lungs are hypoinflated with subtle left basilar opacification likely atelectasis. There is blunting of the right costophrenic angle likely a small amount of pleural fluid. There is stable cardiomegaly. Remainder of the exam is unchanged.  IMPRESSION: Suggestion of a small right pleural effusion. Minimal left basilar opacification suggesting atelectasis.  Tubes and lines as described.   Electronically Signed   By: Marin Olp M.D.   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  EXAM: PORTABLE ABDOMEN - 1 VIEW  COMPARISON:  Preoperative CTA abdomen and pelvis 12/31/2012.  FINDINGS: Midline surgical skin staples. Nasogastric tube in the stomach. Bowel gas pattern unremarkable without evidence of obstruction or significant ileus. No visible free intraperitoneal air on the supine image. No unexpected opaque foreign bodies. No visible opaque urinary tract calculi. Degenerative changes involving the lower lumbar spine.  IMPRESSION: No acute abdominal abnormality.   Electronically Signed   By: Evangeline Dakin M.D.   On: 07/10/2013 15:18    Labs: BMET  Recent Labs Lab 07/10/13 1431 07/11/13 0500 07/11/13 1300 07/12/13 0338  NA 136* 135*  --  130*  K 5.4* 4.6  --  4.8  CL 102 101  --  95*  CO2 22 22  --  18*  GLUCOSE 285* 245*  --  166*  BUN 31* 41*  --  51*  CREATININE 1.71* 3.07*  --  5.15*  CALCIUM 7.2* 7.1*  --  7.0*  PHOS  --   --  3.9  --    CBC  Recent Labs Lab 07/10/13 1431 07/11/13 0500 07/12/13 0338  WBC 15.9* 12.5* 13.2*  NEUTROABS  --   --  11.8*  HGB 14.8 12.8* 11.2*  HCT 43.4 39.0 33.9*  MCV 89.9 90.1 90.2  PLT 105* 92* 75*    Medications:    . albuterol  2.5 mg  Nebulization Q4H  . antiseptic oral rinse  15 mL Mouth Rinse QID  . aspirin  324 mg Per NG tube Daily  . chlorhexidine  15 mL Mouth Rinse BID  . furosemide  160 mg Intravenous TID  . insulin aspart  0-15 Units Subcutaneous Q4H  . pantoprazole (PROTONIX) IV  40 mg Intravenous QHS   Assessment/ Plan:   Assessment:  1.) POD #2 s/p AAA repair, remains ventilated / sedated, on presors 2.) Acute On chronic renal failure s/p AAA repair - acute injury secondary to hypotension, cross clamping of renal arteries (ischemic insults); pt did also receive vancomycin postop. FENa 3 suggests ATN. Baseline Cr ~1.5 in the past; hx ARF history with previous peak of 4.73, now up to 5.15.  ~50 mL dark yellow urine in Foley bag, UOP only about 368 past 24h.  3.) CHF  4.) DM  5.) CAD   Plan:  1.) Renal function daily  2.) Close BP monitoring, pressors per CCM / surgery 3.) Strict I/O  4.) Continue Lasix 160 mg IV TID, for now  Please see also pending attending cosign for edits / additions.  Emmaline Kluver, MD PGY-2, [facility] 07/12/2013, 8:29 AM I have seen and examined this patient and agree with plan per Dr Venetia Maxon.  On 2 pressors for BP.  UO decreasing and Scr increasing.  I suspect he will need HD in next 1-2 days.  Cont with IV lasix and BP support.  Daily labs.Marland Kitchen Kharon Hixon T,MD 07/12/2013 10:27 AM

## 2013-07-12 NOTE — Progress Notes (Signed)
PT Cancellation Note  Patient Details Name: [name] MRN: [id number] DOB: 24-Jun-1941   Cancelled Treatment:    Reason Eval/Treat Not Completed: Patient not medically ready.  Pt remains on intubated   Vamsi Apfel 07/12/2013, 10:02 AM  Antoine Poche, PT DPT [phone]

## 2013-07-13 ENCOUNTER — Inpatient Hospital Stay (HOSPITAL_COMMUNITY): Payer: Medicare Other

## 2013-07-13 LAB — GLUCOSE, CAPILLARY
GLUCOSE-CAPILLARY: 99 mg/dL (ref 70–99)
GLUCOSE-CAPILLARY: 91 mg/dL (ref 70–99)
Glucose-Capillary: 128 mg/dL — ABNORMAL HIGH (ref 70–99)
Glucose-Capillary: 110 mg/dL — ABNORMAL HIGH (ref 70–99)
Glucose-Capillary: 92 mg/dL (ref 70–99)
Glucose-Capillary: 122 mg/dL — ABNORMAL HIGH (ref 70–99)

## 2013-07-13 LAB — RENAL FUNCTION PANEL
Albumin: 2.3 g/dL — ABNORMAL LOW (ref 3.5–5.2)
Albumin: 2.3 g/dL — ABNORMAL LOW (ref 3.5–5.2)
BUN: 72 mg/dL — AB (ref 6–23)
BUN: 76 mg/dL — ABNORMAL HIGH (ref 6–23)
CALCIUM: 7.9 mg/dL — AB (ref 8.4–10.5)
CO2: 20 mEq/L (ref 19–32)
CO2: 18 mEq/L — ABNORMAL LOW (ref 19–32)
Calcium: 7.5 mg/dL — ABNORMAL LOW (ref 8.4–10.5)
Chloride: 94 mEq/L — ABNORMAL LOW (ref 96–112)
Chloride: 94 mEq/L — ABNORMAL LOW (ref 96–112)
Creatinine, Ser: 7.14 mg/dL — ABNORMAL HIGH (ref 0.50–1.35)
Creatinine, Ser: 7.07 mg/dL — ABNORMAL HIGH (ref 0.50–1.35)
GFR calc Af Amer: 8 mL/min — ABNORMAL LOW (ref >90)
GFR calc non Af Amer: 7 mL/min — ABNORMAL LOW (ref >90)
GFR calc non Af Amer: 7 mL/min — ABNORMAL LOW (ref >90)
GFR, EST AFRICAN AMERICAN: 8 mL/min — AB (ref >90)
GLUCOSE: 111 mg/dL — AB (ref 70–99)
Glucose, Bld: 133 mg/dL — ABNORMAL HIGH (ref 70–99)
PHOSPHORUS: 6.6 mg/dL — AB (ref 2.3–4.6)
Phosphorus: 6.7 mg/dL — ABNORMAL HIGH (ref 2.3–4.6)
Potassium: 5.1 mEq/L (ref 3.7–5.3)
Potassium: 5.2 mEq/L (ref 3.7–5.3)
SODIUM: 131 meq/L — AB (ref 137–147)
Sodium: 130 mEq/L — ABNORMAL LOW (ref 137–147)

## 2013-07-13 LAB — POCT I-STAT 3, ART BLOOD GAS (G3+)
ACID-BASE DEFICIT: 8 mmol/L — AB (ref 0.0–2.0)
Bicarbonate: 16.9 mEq/L — ABNORMAL LOW (ref 20.0–24.0)
O2 SAT: 93 %
PO2 ART: 76 mmHg — AB (ref 80.0–100.0)
Patient temperature: 38
TCO2: 18 mmol/L (ref 0–100)
pCO2 arterial: 31.8 mmHg — ABNORMAL LOW (ref 35.0–45.0)
pH, Arterial: 7.339 — ABNORMAL LOW (ref 7.350–7.450)

## 2013-07-13 LAB — CBC
HEMATOCRIT: 29.6 % — AB (ref 39.0–52.0)
Hemoglobin: 10 g/dL — ABNORMAL LOW (ref 13.0–17.0)
MCH: 29.9 pg (ref 26.0–34.0)
MCHC: 33.8 g/dL (ref 30.0–36.0)
MCV: 88.6 fL (ref 78.0–100.0)
Platelets: 61 10*3/uL — ABNORMAL LOW (ref 150–400)
RBC: 3.34 MIL/uL — ABNORMAL LOW (ref 4.22–5.81)
RDW: 15.4 % (ref 11.5–15.5)
WBC: 4.8 10*3/uL (ref 4.0–10.5)

## 2013-07-13 MED ORDER — PRISMASOL BGK 4/2.5 32-4-2.5 MEQ/L IV SOLN
INTRAVENOUS | Status: DC
  Administered 2013-07-13 – 2013-07-15 (×12): via INTRAVENOUS_CENTRAL
  Filled 2013-07-13 (×31): qty 5000

## 2013-07-13 MED ORDER — SODIUM CHLORIDE 0.9 % IV SOLN
250.0000 mg | Freq: Two times a day (BID) | INTRAVENOUS | Status: DC
  Administered 2013-07-13 (×2): 250 mg via INTRAVENOUS
  Filled 2013-07-13 (×4): qty 250

## 2013-07-13 MED ORDER — VITAL AF 1.2 CAL PO LIQD
1000.0000 mL | ORAL | Status: DC
  Administered 2013-07-13: 1000 mL
  Filled 2013-07-13 (×3): qty 1000

## 2013-07-13 MED ORDER — SODIUM CHLORIDE 0.9 % FOR CRRT
INTRAVENOUS_CENTRAL | Status: DC | PRN
  Filled 2013-07-13: qty 1000

## 2013-07-13 MED ORDER — HEPARIN SODIUM (PORCINE) 5000 UNIT/ML IJ SOLN
5000.0000 [IU] | Freq: Three times a day (TID) | INTRAMUSCULAR | Status: DC
  Administered 2013-07-13 – 2013-07-25 (×37): 5000 [IU] via SUBCUTANEOUS
  Filled 2013-07-13 (×41): qty 1

## 2013-07-13 MED ORDER — LINEZOLID 2 MG/ML IV SOLN
600.0000 mg | Freq: Two times a day (BID) | INTRAVENOUS | Status: DC
  Administered 2013-07-13 – 2013-07-18 (×11): 600 mg via INTRAVENOUS
  Filled 2013-07-13 (×12): qty 300

## 2013-07-13 MED ORDER — PRISMASOL BGK 4/2.5 32-4-2.5 MEQ/L IV SOLN
INTRAVENOUS | Status: DC
  Administered 2013-07-13: 16:00:00 via INTRAVENOUS_CENTRAL
  Filled 2013-07-13 (×4): qty 5000

## 2013-07-13 MED ORDER — PRISMASOL BGK 4/2.5 32-4-2.5 MEQ/L IV SOLN
INTRAVENOUS | Status: DC
  Administered 2013-07-13 – 2013-07-14 (×4): via INTRAVENOUS_CENTRAL
  Filled 2013-07-13 (×10): qty 5000

## 2013-07-13 MED ORDER — HEPARIN SODIUM (PORCINE) 1000 UNIT/ML IJ SOLN
1000.0000 [IU] | Freq: Once | INTRAMUSCULAR | Status: DC
  Filled 2013-07-13 (×3): qty 1

## 2013-07-13 MED ORDER — SODIUM CHLORIDE 0.9 % IV SOLN
INTRAVENOUS | Status: DC
  Administered 2013-07-13: 16:00:00 via INTRAVENOUS

## 2013-07-13 MED ORDER — HEPARIN SODIUM (PORCINE) 1000 UNIT/ML DIALYSIS
1000.0000 [IU] | INTRAMUSCULAR | Status: DC | PRN
  Administered 2013-07-13 – 2013-07-15 (×2): 3200 [IU] via INTRAVENOUS_CENTRAL
  Filled 2013-07-13 (×2): qty 6
  Filled 2013-07-13: qty 2
  Filled 2013-07-13: qty 3
  Filled 2013-07-13: qty 6

## 2013-07-13 NOTE — Procedures (Signed)
    Central Venous hemodialysis Catheter Insertion Procedure Note FRANCIS KRYGOWSKI [id number] September 26, 1940  Procedure: Insertion of Central hemodialysisVenous Catheter Indications: hemodialysis   Procedure Details Consent: Risks of procedure as well as the alternatives and risks of each were explained to the (patient/caregiver).  Consent for procedure obtained. Time Out: Verified patient identification, verified procedure, site/side was marked, verified correct patient position, special equipment/implants available, medications/allergies/relevent history reviewed, required imaging and test results available.  Performed Real time [place] was used to ID and cannulate the vessel  Maximum sterile technique was used including antiseptics, cap, gloves, gown, hand hygiene, mask and sheet. Skin prep: Chlorhexidine; local anesthetic administered A antimicrobial bonded/coated triple lumen catheter was placed in the left femoral vein due to patient being a dialysis patient using the Seldinger technique.  Evaluation Blood flow good Complications: No apparent complications Patient did tolerate procedure well. Chest X-ray ordered to verify placement.  CXR: not required .  BABCOCK,PETE 07/13/2013, 10:40 AM   Chesley Mires, MD [facility] Pulmonary/Critical Care 07/13/2013, 11:47 AM Pager:  [phone] After 3pm call: [phone]

## 2013-07-13 NOTE — Progress Notes (Signed)
    Subjective  - POD #3, status post open abdominal aorta aneurysm repair  Afebrile overnight Remains intubated Denies pain  Physical Exam:  General: Arouses to verbal commands Neuro: Able to grip both upper extremities and move both feet.  Verbalizes understanding. Cardiac: Remains on in the and dopamine.  Tachycardia. Vascular: Extremities are warm and well perfused without cyanosis or mottling. Incisions: Midline abdominal incision is intact.  There is a small amount of drainage from the right femoral incision.      Assessment/Plan:  POD #3  Cardiac: [facility] Swan-Ganz catheter today.  We'll attempt to wean knee L. and dopamine drips Renal: Made 745 cc of urine yesterday, however his creatinine has increased to greater than 7.  Potassium is 5.1 this morning.  Nephrology is following him likely initiate CVVHD this morning. GI: No bowel function as of yet, however the NG tube has approximately 100 cc per shift.  The patient will likely remain intubated for several days, will need to consider starting enteral nutrition within the next one to 2 days. Pulmonary: Not much progress made on the ventilator.  He is now febrile with purulent secretions.  This was cultured yesterday.  CCM is following and will likely start antibiotics today. Prophylaxis: From our perspective Lovenox would be able to be started  Vincent IV, [name] 07/13/2013 8:11 AM --  Filed Vitals:   07/13/13 0737  BP:   Pulse: 111  Temp: 102 F (38.9 C)  Resp: 25    Intake/Output Summary (Last 24 hours) at 07/13/13 0811 Last data filed at 07/13/13 0737  Gross per 24 hour  Intake 1665.92 ml  Output   1215 ml  Net 450.92 ml     Laboratory CBC    Component Value Date/Time   WBC 4.8 07/13/2013 0330   HGB 10.0* 07/13/2013 0330   HCT 29.6* 07/13/2013 0330   PLT 61* 07/13/2013 0330    BMET    Component Value Date/Time   NA 131* 07/13/2013 0330   K 5.1 07/13/2013 0330   CL 94* 07/13/2013 0330   CO2 20  07/13/2013 0330   GLUCOSE 133* 07/13/2013 0330   BUN 72* 07/13/2013 0330   CREATININE 7.14* 07/13/2013 0330   CREATININE 4.17* 01/02/2013 1618   CALCIUM 7.5* 07/13/2013 0330   GFRNONAA 7* 07/13/2013 0330   GFRAA 8* 07/13/2013 0330    COAG Lab Results  Component Value Date   INR 1.34 07/10/2013   INR 1.04 07/04/2013   INR 1.13 11/04/2012   No results found for this basename: PTT    Antibiotics Anti-infectives   Start     Dose/Rate Route Frequency Ordered Stop   07/10/13 2000  vancomycin (VANCOCIN) IVPB 1000 mg/200 mL premix     1,000 mg 200 mL/hr over 60 Minutes Intravenous Every 12 hours 07/10/13 1431 07/11/13 0900   07/09/13 1249  vancomycin (VANCOCIN) 1,500 mg in sodium chloride 0.9 % 500 mL IVPB     1,500 mg 250 mL/hr over 120 Minutes Intravenous 120 min pre-op 07/09/13 1249 07/10/13 0735       V. Leia Alf, M.D. [facility] Office: [phone] Pager:  [phone]

## 2013-07-13 NOTE — Progress Notes (Signed)
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 Intra op course notable for estimated 50 minutes clamp time above the renals. Returned to the ICU post-op on the vent. PCCM asked to assist w/ post-op care.   SIGNIFICANT EVENTS / STUDIES:   1/8: s/p resection and grafting of perirenal AAA 1/8 1/11: change to pressure control  LINES / TUBES: OETT 1/8>>> Left IJ PCA 1/8>>> Left rad-aline 1/8>>> Right HD 1/11>>>  CULTURES: Urine 1/9 >>>> resp culture 1/10>>>  ANTIBIOTICS: vanc 1/8>>>1/9 Zosyn 1/11>>> zyvox 1/11>>>  SUBJECTIVE:  Febrile   VITAL SIGNS: Temp:  [99.5 F (37.5 C)-102.3 F (39.1 C)] 102.3 F (39.1 C) (01/11 0816) Pulse Rate:  [102-117] 111 (01/11 0737) Resp:  [18-39] 25 (01/11 0737) BP: (82-132)/(43-71) 94/60 mmHg (01/11 0700) SpO2:  [91 %-100 %] 99 % (01/11 0737) Arterial Line BP: (83-145)/(47-69) 99/56 mmHg (01/11 0737) FiO2 (%):  [40 %-80 %] 50 % (01/11 0751) Weight:  [111.7 kg (246 lb 4.1 oz)] 111.7 kg (246 lb 4.1 oz) (01/11 0400) HEMODYNAMICS: PAP: (33-55)/(5-26) 45/21 mmHg CVP:  [1 mmHg-19 mmHg] 13 mmHg VENTILATOR SETTINGS: Vent Mode:  [-] PRVC FiO2 (%):  [40 %-80 %] 50 % Set Rate:  [16 bmp] 16 bmp Vt Set:  [500 mL] 500 mL PEEP:  [5 cmH20] 5 cmH20 Pressure Support:  [10 cmH20] 10 cmH20 Plateau Pressure:  [15 cmH20-20 cmH20] 20 cmH20 INTAKE / OUTPUT: Intake/Output     01/10 0701 - 01/11 0700 01/11 0701 - 01/12 0700   I.V. (mL/kg) 1227.2 (11)    Other     NG/GT 160 30   IV Piggyback 330    Total Intake(mL/kg) 1717.2 (15.4) 30 (0.3)   Urine (mL/kg/hr) 745 (0.3)    Emesis/NG output 450 (0.2) 40 (0.3)   Total Output 1195 40   Net +522.2 -10           PHYSICAL EXAMINATION: General: ill appearing, extremely volume overloaded  Neuro: RASS -1, opens eyes with stimulation HEENT: ETT in place Cardiovascular: regular, tachycardic Lungs: scattered rhonchi Abdomen: soft, wound site clean, distended, firm  Musculoskeletal: 1+ edema Skin: lower extremities cool  LABS:  CBC  Recent Labs Lab 07/11/13 0500 07/12/13 0338 07/13/13 0330  WBC 12.5* 13.2* 4.8  HGB 12.8* 11.2* 10.0*  HCT 39.0 33.9* 29.6*  PLT 92* 75* 61*   Coag's  Recent Labs Lab 07/10/13 1431  APTT 33  INR 1.34   BMET  Recent Labs Lab 07/11/13 0500 07/12/13 0338 07/13/13 0330  NA 135* 130* 131*  K 4.6 4.8 5.1  CL 101 95* 94*  CO2 22 18* 20  BUN 41* 51* 72*  CREATININE 3.07* 5.15* 7.14*  GLUCOSE 245* 166* 133*   Electrolytes  Recent Labs Lab 07/10/13 1431 07/11/13 0500 07/11/13 1300 07/12/13 0338 07/13/13 0330  CALCIUM 7.2* 7.1*  --  7.0* 7.5*  MG 1.3* 1.6  --   --   --   PHOS  --   --  3.9  --  6.6*   ABG  Recent Labs Lab 07/10/13 1447 07/10/13 1647 07/11/13 0401  PHART 7.278* 7.280* 7.327*  PCO2ART 50.2* 51.5* 42.4  PO2ART 75.0* 99.0 65.0*  Liver Enzymes  Recent Labs Lab 07/11/13 0500 07/12/13 0338 07/13/13 0330  AST 64* 59*  --   ALT 25 29  --   ALKPHOS 71 67  --   BILITOT 1.3* 1.0  --   ALBUMIN 2.7* 2.5* 2.3*   Glucose  Recent Labs Lab 07/12/13 0821 07/12/13 1215 07/12/13 1607 07/12/13 2020 07/13/13 0005 07/13/13 0813  GLUCAP 140* 152* 124* 103* 128* 99    Imaging Dg Chest Port 1 View  07/12/2013   CLINICAL DATA:  Status post repair of abdominal aortic aneurysm.  EXAM: PORTABLE CHEST - 1 VIEW  COMPARISON:  07/11/2013  FINDINGS: Endotracheal tube remains with the tip approximately 3 cm above the carina. Swan-Ganz catheter tip is in the proximal right pulmonary artery. Bibasilar atelectasis present. No pneumothorax. Suggestion of mild interstitial edema is relatively stable. No significant pleural fluid is  identified. There is stable cardiomegaly.  IMPRESSION: Bibasilar atelectasis. Stable suggestion of mild interstitial edema.   Electronically Signed   By: Aletta Edouard M.D.   On: 07/12/2013 08:40   ASSESSMENT / PLAN:  PULMONARY A:  Acute respiratory failure 2nd to shock, metabolic derangements, atelectasis. Hx of OSA, COPD. Right > left airspace disease. 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 Hx of CAD and severe AS s/p CABG and AVR w/ Bioprosthetic valve 4/14, last EF 55-60% 8/14. Hx of HTN. >remains pressor dependant  P:  -wean of pressors per primary team  RENAL A:  Acute kidney injury. Hx of CKD stage 3A. P:   -start HD 1/11  GASTROINTESTINAL A:   Post op ileus. Nutrition. Hx of GERD. P:   -start nutrition  -protonix for SUP  HEMATOLOGIC A:   Acute blood loss anemia after AAA repair. Thrombocytopenia. P:  -f/u CBC -start [place] heparin   INFECTIOUS A:   Fever, new RLL airspace disease  P:   -respiratory culture pending -start HCAP coverage -trend PCT   ENDOCRINE A:   DM w/ hyperglycemia  P:   -SSI  NEUROLOGIC A:   Acute encephalopathy 2nd to shock, renal failure, respiratory failure.  P:   -sedation protocol >> goal RASS -1    Reviewed above, examined pt, and agree with assessment/plan.  Remains on pressors.  Likely needs renal replacement therapy.  Change to pressure control.  CC time 35 minutes.  Chesley Mires, MD [facility] Pulmonary/Critical Care 07/13/2013, 8:53 AM Pager:  [phone] After 3pm call: [phone]

## 2013-07-13 NOTE — Progress Notes (Signed)
PT Cancellation Note  Patient Details Name: [name] MRN: [id number] DOB: 1940/12/28   Cancelled Treatment:    Reason Eval/Treat Not Completed: Patient not medically ready   [name] 07/13/2013, 1:27 PM

## 2013-07-13 NOTE — Progress Notes (Signed)
Patient ID: HEVER MISHOE, male   DOB: 19-Apr-1941, 73 y.o.   MRN: [id number] [facility]  Patient name: VISHNU JESBERGER Medical record number: [id number] Date of birth: [birth year] Age: 73 y.o. Gender: male  Primary Care Provider: [name], MD   Subjective: Patient febrile (T max 102). Respiratory panel pending.   Objective: Temp:  [99.5 F (37.5 C)-102 F (38.9 C)] 102 F (38.9 C) (01/11 0700) Pulse Rate:  [102-117] 109 (01/11 0700) Resp:  [18-39] 35 (01/11 0700) BP: (82-132)/(43-71) 94/60 mmHg (01/11 0700) SpO2:  [91 %-100 %] 97 % (01/11 0700) Arterial Line BP: (83-145)/(47-69) 110/59 mmHg (01/11 0700) FiO2 (%):  [40 %-80 %] 60 % (01/11 0435) Weight:  [246 lb 4.1 oz (111.7 kg)] 246 lb 4.1 oz (111.7 kg) (01/11 0400)  Intake/Output Summary (Last 24 hours) at 07/13/13 0813 Last data filed at 07/13/13 0737  Gross per 24 hour  Intake 1665.92 ml  Output   1215 ml  Net 450.92 ml   25 cc yellow (Clear) in foley bag.   Physical Exam: General: Intubated.  Cardiovascular:  Respiratory: Mild rhonchi bilaterally. Abdomen: Soft. Distended. BS not appreciated. Extremities: No erythema or trace edema. Lt Fem HD cath Laboratory:  Recent Labs Lab 07/11/13 0500 07/12/13 0338 07/13/13 0330  WBC 12.5* 13.2* 4.8  HGB 12.8* 11.2* 10.0*  HCT 39.0 33.9* 29.6*  PLT 92* 75* 61*    Recent Labs Lab 07/11/13 0500 07/12/13 0338 07/13/13 0330  NA 135* 130* 131*  K 4.6 4.8 5.1  CL 101 95* 94*  CO2 22 18* 20  BUN 41* 51* 72*  CREATININE 3.07* 5.15* 7.14*  CALCIUM 7.1* 7.0* 7.5*  PROT 5.1* 5.4*  --   BILITOT 1.3* 1.0  --   ALKPHOS 71 67  --   ALT 25 29  --   AST 64* 59*  --   GLUCOSE 245* 166* 133*      Imaging/Diagnostic Tests: Dg Chest Port 1 View  07/12/2013   CLINICAL DATA:  Status post repair of abdominal aortic aneurysm.  EXAM: PORTABLE CHEST - 1 VIEW  COMPARISON:  07/11/2013  FINDINGS: Endotracheal tube remains with the tip approximately 3 cm above  the carina. Swan-Ganz catheter tip is in the proximal right pulmonary artery. Bibasilar atelectasis present. No pneumothorax. Suggestion of mild interstitial edema is relatively stable. No significant pleural fluid is identified. There is stable cardiomegaly.  IMPRESSION: Bibasilar atelectasis. Stable suggestion of mild interstitial edema.   Electronically Signed   By: Aletta Edouard M.D.   On: 07/12/2013 08:40   Assessment:  1.) POD #2 s/p AAA repair, remains ventilated / sedated, on presors  2.) Acute On chronic renal failure s/p AAA repair - acute injury secondary to hypotension, cross clamping of renal arteries (ischemic insults); pt did also receive vancomycin postop. FENa 3 suggests ATN. Baseline Cr ~1.5 in the past; hx ARF history with previous peak of 4.73, now up to (5.15--> 7.14). 25cc clear yellow urine in Foley bag, UOP only about 745 past 24h.  3.) CHF  4.) DM  5.) CAD   Plan:  1.) Renal function daily; likely will need HD with increasing Scr and poor UOP 2.) Close BP monitoring, pressors per CCM / surgery  3.) Strict I/O    Ma Hillock, DO 07/13/2013, 7:25 AM PGY-2 I have seen and examined this patient and agree with plan per Dr Raoul Pitch.  Renal fx cont to worsen and UO decreasing.  He is on 2 pressors will lowish BP.  Will plan to start CVVHD. Cormac Wint T,MD 07/13/2013 10:35 AM

## 2013-07-13 NOTE — Procedures (Signed)
    Central Venous Catheter Insertion Procedure Note [name] [id number] 15-Sep-1940  Procedure: Insertion of Central Venous Catheter Indications: Assessment of intravascular volume, Drug and/or fluid administration and Frequent blood sampling  Procedure Details Consent: Risks of procedure as well as the alternatives and risks of each were explained to the (patient/caregiver).  Consent for procedure obtained. Time Out: Verified patient identification, verified procedure, site/side was marked, verified correct patient position, special equipment/implants available, medications/allergies/relevent history reviewed, required imaging and test results available.  Performed Real time [place] was used to ID and cannulate the vessel  Maximum sterile technique was used including antiseptics, cap, gloves, gown, hand hygiene, mask and sheet. Skin prep: Chlorhexidine; local anesthetic administered A antimicrobial bonded/coated triple lumen catheter was placed in the left internal jugular vein using the Seldinger technique.  Evaluation Blood flow good Complications: No apparent complications Patient did tolerate procedure well. Chest X-ray ordered to verify placement.  CXR: pending.  BABCOCK,PETE 07/13/2013, 10:38 AM  Chesley Mires, MD [facility] Pulmonary/Critical Care 07/13/2013, 11:47 AM Pager:  [phone] After 3pm call: [phone]

## 2013-07-13 NOTE — Progress Notes (Signed)
Brief Nutrition Note  Consult received for enteral/tube feeding initiation and management.  Adult Enteral Nutrition Protocol initiated. Full assessment to follow.   Vital 1.2 @ 20 ml/hr ordered.   Admitting Dx: AAA  Body mass index is 40.34 kg/(m^2). Pt meets criteria for morbid obesity based on current BMI.  Labs:   Recent Labs Lab 07/10/13 1431 07/11/13 0500 07/11/13 1300 07/12/13 0338 07/13/13 0330  NA 136* 135*  --  130* 131*  K 5.4* 4.6  --  4.8 5.1  CL 102 101  --  95* 94*  CO2 22 22  --  18* 20  BUN 31* 41*  --  51* 72*  CREATININE 1.71* 3.07*  --  5.15* 7.14*  CALCIUM 7.2* 7.1*  --  7.0* 7.5*  MG 1.3* 1.6  --   --   --   PHOS  --   --  3.9  --  6.6*  GLUCOSE 285* 245*  --  166* 133*    Terrace Arabia RD, LDN

## 2013-07-13 NOTE — Progress Notes (Addendum)
ANTIBIOTIC CONSULT NOTE - INITIAL  Pharmacy Consult for Primaxin Indication: HCAP  Allergies  Allergen Reactions  . Omnipaque [Iohexol] Other (See Comments)    Decreased kidney function  . Cephalosporins Rash    Blisters    Patient Measurements: Height: 5' 5.5" (166.4 cm) Weight: 246 lb 4.1 oz (111.7 kg) IBW/kg (Calculated) : 62.65 Adjusted Body Weight:   Vital Signs: Temp: 102.3 F (39.1 C) (01/11 0816) Temp src: Oral (01/11 0816) BP: 90/60 mmHg (01/11 0900) Pulse Rate: 113 (01/11 0900) Intake/Output from previous day: 01/10 0701 - 01/11 0700 In: 1717.2 [I.V.:1227.2; NG/GT:160; IV Piggyback:330] Out: [id number] [Urine:745; Emesis/NG output:450] Intake/Output from this shift: Total I/O In: 30 [NG/GT:30] Out: 40 [Emesis/NG output:40]  Labs:  Recent Labs  07/11/13 0500 07/11/13 1345 07/12/13 0338 07/13/13 0330  WBC 12.5*  --  13.2* 4.8  HGB 12.8*  --  11.2* 10.0*  PLT 92*  --  75* 61*  LABCREA  --  32.91  --   --   CREATININE 3.07*  --  5.15* 7.14*   Estimated Creatinine Clearance: 11 ml/min (by C-G formula based on Cr of 7.14). 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  Test performance has     been validated by [employer] for patients greater     than or equal to 49 year old.     It is not intended     to diagnose infection nor to     guide or  monitor treatment.  URINE CULTURE     Status: None   Collection Time    07/11/13  1:45 PM      Result Value Range Status   Specimen Description URINE, CATHETERIZED   Final   Special Requests NONE   Final   Culture  Setup Time     Final   Value: 07/11/2013 15:28     Performed at [facility] PENDING   Incomplete   Culture     Final   Value: Culture reincubated for better growth     Performed at [facility]   Report Status PENDING   Incomplete  CULTURE, RESPIRATORY (NON-EXPECTORATED)     Status: None   Collection Time    07/12/13  5:00 PM      Result Value Range Status   Specimen Description TRACHEAL ASPIRATE   Final   Special Requests NONE   Final   Gram Stain PENDING   Incomplete   Culture     Final   Value: Culture reincubated for better growth     Performed at [facility]   Report Status PENDING   Incomplete    Medical History: Past Medical History  Diagnosis Date  .  Hypertension   . Hyperlipidemia   . Prediabetes   . CHF (congestive heart failure) 07/2012; 10/17/2012  . COPD (chronic obstructive pulmonary disease)     a.  History of heavy smoking. PFTs (4/14) with mixed obstructive (COPD) and restrictive (post-ARDS) picture.   . Pneumonia 07/2012    PNA (H1N1 influenza + pneumococcus) in [id number] complicated by respiratory failure/ARDS. Required tracheostomy, now weaned off. Had left empyema requiring chest tube.   . Diabetes mellitus without complication   . History of blood transfusion     "lots since January" (10/17/2012)  . Ischemic cardiomyopathy   . Anemia of chronic disease   . Aortic stenosis     Moderate to severe by echo in 4/14. Bioprosthetic #21 [facility] Ease aortic valve replacement in 4/14.   Marland Kitchen CAD (coronary artery disease)     a. 4/14 CABG: LIMA-LAD, seq SVG-ramus & OM1, SVG-PDA  . Carotid arterial disease     Carotid dopplers (0000000) with [id number] LICA stenosis.   Marland Kitchen AAA (abdominal aortic aneurysm)     5/14 CT  showed > 6 cm AAA, also right iliac aneurysm. Not stent graft candidate.   . Esophageal reflux   . Ischemic cardiomyopathy     a. 4/14 LHC: pLAD 95, ost ramus 70-80, mLCx 90, EF 30%  . Chronic systolic heart failure     a. 1/14 ECHO: sev dil LV, EF30-35%, diff HK, mild MR, AS severe, AV grad 35 b. 8/14 ECHO: EF 55-60%, mild biopros AV sten mn grad. 25, RV mild dil, RA mild dil  . Complication of anesthesia     during last surgery had to be given special medicine b/c "something dropped" during surgery   . Anxiety     pt. admits that he has anxiety at times   . Shortness of breath     still goes deer hunting by himself  . OSA (obstructive sleep apnea)     on cpap- every sleep time.   . Chronic kidney disease     increased creatinine recently - being followed, near kidney failure fr. contrast dye   . Prostate cancer      s/p radiation treatment. - (PT. DENIES)Has indwelling foley.   . Arthritis     Medications:  Scheduled:  . antiseptic oral rinse  15 mL Mouth Rinse QID  . arformoterol  15 mcg Nebulization BID  . aspirin  324 mg Per NG tube Daily  . budesonide (PULMICORT) nebulizer solution  0.5 mg Nebulization BID  . chlorhexidine  15 mL Mouth Rinse BID  . Chlorhexidine Gluconate Cloth  6 each Topical Daily  . furosemide  160 mg Intravenous TID  . heparin subcutaneous  5,000 Units Subcutaneous Q8H  . insulin aspart  0-15 Units Subcutaneous Q4H  . linezolid  600 mg Intravenous Q12H  . pantoprazole (PROTONIX) IV  40 mg Intravenous QHS   Assessment: 73yo male with HCAP.  He is on Zyvox 600mg  IV q12, not requiring renal adjustment, and to start Primaxin as well.  Pt with hx of CKD-3a, now with AKI and sharp increase in Cr with plans for dialysis today.   Goal of Therapy:  Resolution of infection  Plan:  Primaxin 250mg  IV q12  Gracy Bruins, PharmD Clinical Pharmacist [facility]

## 2013-07-14 ENCOUNTER — Encounter (HOSPITAL_COMMUNITY): Payer: Self-pay | Admitting: Vascular Surgery

## 2013-07-14 LAB — COMPREHENSIVE METABOLIC PANEL
ALBUMIN: 2.2 g/dL — AB (ref 3.5–5.2)
ALK PHOS: 84 U/L (ref 39–117)
ALT: 23 U/L (ref 0–53)
AST: 23 U/L (ref 0–37)
BUN: 64 mg/dL — ABNORMAL HIGH (ref 6–23)
CHLORIDE: 95 meq/L — AB (ref 96–112)
CO2: 21 mEq/L (ref 19–32)
Calcium: 8.2 mg/dL — ABNORMAL LOW (ref 8.4–10.5)
Creatinine, Ser: 5.44 mg/dL — ABNORMAL HIGH (ref 0.50–1.35)
GFR calc Af Amer: 11 mL/min — ABNORMAL LOW (ref >90)
GFR calc non Af Amer: 10 mL/min — ABNORMAL LOW (ref >90)
Glucose, Bld: 139 mg/dL — ABNORMAL HIGH (ref 70–99)
POTASSIUM: 4.7 meq/L (ref 3.7–5.3)
SODIUM: 131 meq/L — AB (ref 137–147)
Total Bilirubin: 1 mg/dL (ref 0.3–1.2)
Total Protein: 5.7 g/dL — ABNORMAL LOW (ref 6.0–8.3)

## 2013-07-14 LAB — CBC
HCT: 28.2 % — ABNORMAL LOW (ref 39.0–52.0)
Hemoglobin: 9.8 g/dL — ABNORMAL LOW (ref 13.0–17.0)
MCH: 30.3 pg (ref 26.0–34.0)
MCHC: 34.8 g/dL (ref 30.0–36.0)
MCV: 87.3 fL (ref 78.0–100.0)
Platelets: 79 10*3/uL — ABNORMAL LOW (ref 150–400)
RBC: 3.23 MIL/uL — ABNORMAL LOW (ref 4.22–5.81)
RDW: 15.5 % (ref 11.5–15.5)
WBC: 8.3 10*3/uL (ref 4.0–10.5)

## 2013-07-14 LAB — POCT I-STAT 3, ART BLOOD GAS (G3+)
ACID-BASE DEFICIT: 4 mmol/L — AB (ref 0.0–2.0)
BICARBONATE: 21.4 meq/L (ref 20.0–24.0)
O2 Saturation: 94 %
PO2 ART: 74 mmHg — AB (ref 80.0–100.0)
Patient temperature: 98.4
TCO2: 23 mmol/L (ref 0–100)
pCO2 arterial: 37.5 mmHg (ref 35.0–45.0)
pH, Arterial: 7.364 (ref 7.350–7.450)

## 2013-07-14 LAB — GLUCOSE, CAPILLARY
Glucose-Capillary: 109 mg/dL — ABNORMAL HIGH (ref 70–99)
Glucose-Capillary: 115 mg/dL — ABNORMAL HIGH (ref 70–99)
Glucose-Capillary: 118 mg/dL — ABNORMAL HIGH (ref 70–99)
Glucose-Capillary: 138 mg/dL — ABNORMAL HIGH (ref 70–99)
Glucose-Capillary: 76 mg/dL (ref 70–99)
Glucose-Capillary: 97 mg/dL (ref 70–99)

## 2013-07-14 LAB — TSH: TSH: 0.139 u[IU]/mL — AB (ref 0.350–4.500)

## 2013-07-14 LAB — RENAL FUNCTION PANEL
ALBUMIN: 2 g/dL — AB (ref 3.5–5.2)
BUN: 55 mg/dL — AB (ref 6–23)
CO2: 23 mEq/L (ref 19–32)
Calcium: 8.2 mg/dL — ABNORMAL LOW (ref 8.4–10.5)
Chloride: 99 mEq/L (ref 96–112)
Creatinine, Ser: 4.31 mg/dL — ABNORMAL HIGH (ref 0.50–1.35)
GFR calc Af Amer: 15 mL/min — ABNORMAL LOW (ref >90)
GFR, EST NON AFRICAN AMERICAN: 13 mL/min — AB (ref >90)
Glucose, Bld: 114 mg/dL — ABNORMAL HIGH (ref 70–99)
PHOSPHORUS: 4.8 mg/dL — AB (ref 2.3–4.6)
POTASSIUM: 4.3 meq/L (ref 3.7–5.3)
SODIUM: 135 meq/L — AB (ref 137–147)

## 2013-07-14 LAB — CORTISOL: Cortisol, Plasma: 16.8 ug/dL

## 2013-07-14 LAB — PHOSPHORUS: Phosphorus: 6.2 mg/dL — ABNORMAL HIGH (ref 2.3–4.6)

## 2013-07-14 LAB — PROCALCITONIN: Procalcitonin: 23.96 ng/mL

## 2013-07-14 LAB — MAGNESIUM: MAGNESIUM: 2.4 mg/dL (ref 1.5–2.5)

## 2013-07-14 MED ORDER — VITAL HIGH PROTEIN PO LIQD
1000.0000 mL | ORAL | Status: DC
  Administered 2013-07-14 – 2013-07-15 (×2): 1000 mL
  Filled 2013-07-14 (×15): qty 1000

## 2013-07-14 MED ORDER — SODIUM CHLORIDE 0.9 % IV SOLN
250.0000 mg | Freq: Four times a day (QID) | INTRAVENOUS | Status: DC
  Administered 2013-07-14 – 2013-07-15 (×5): 250 mg via INTRAVENOUS
  Filled 2013-07-14 (×7): qty 250

## 2013-07-14 MED ORDER — PRO-STAT SUGAR FREE PO LIQD
30.0000 mL | Freq: Every day | ORAL | Status: DC
  Administered 2013-07-14 – 2013-07-21 (×4): 30 mL
  Filled 2013-07-14 (×9): qty 30

## 2013-07-14 NOTE — Progress Notes (Signed)
Name: [name] MRN: [id number] DOB: [birth year]    ADMISSION DATE:  07/10/2013 CONSULTATION DATE:  1/8  REFERRING MD :  Kellie Simmering   CHIEF COMPLAINT:  Post-op   BRIEF PATIENT DESCRIPTION:  73 year old male w/ multiple medical co-morbids: OSA, CAD, prior CABG and AVR w/ bioprosthetic valve,  s/p resection and grafting of perirenal AAA 1/8. Intra op course notable for estimated 50 minutes clamp time above the renals. Returned to the ICU post-op on the vent. PCCM asked to assist w/ post-op care.   SIGNIFICANT EVENTS / STUDIES:   1/8: s/p resection and grafting of perirenal AAA 1/8 1/11: change to pressure control 1/11 renal replacement therapy  LINES / TUBES: OETT 1/8>>> Left IJ PCA 1/8>>> Left rad-aline 1/8>>> Right HD 1/11>>>  CULTURES: Urine 1/9 >>>> resp culture 1/10>>>  ANTIBIOTICS: vanc 1/8>>>1/9 Zosyn 1/11>>> zyvox 1/11>>>  SUBJECTIVE:  cvvhd  VITAL SIGNS: Temp:  [97.9 F (36.6 C)-100 F (37.8 C)] 98.1 F (36.7 C) (01/12 0848) Pulse Rate:  [93-125] 102 (01/12 1000) Resp:  [0-36] 24 (01/12 1000) BP: (65-133)/(47-79) 103/63 mmHg (01/12 1000) SpO2:  [92 %-100 %] 95 % (01/12 1000) Arterial Line BP: (80-149)/(48-78) 123/71 mmHg (01/12 1000) FiO2 (%):  [50 %-100 %] 50 % (01/12 0835) Weight:  [112.2 kg (247 lb 5.7 oz)] 112.2 kg (247 lb 5.7 oz) (01/12 0451) HEMODYNAMICS:   VENTILATOR SETTINGS: Vent Mode:  [-] PCV FiO2 (%):  [50 %-100 %] 50 % Set Rate:  [22 bmp] 22 bmp PEEP:  [8 cmH20] 8 cmH20 Plateau Pressure:  [22 [id number] cmH20] 28 cmH20 INTAKE / OUTPUT: Intake/Output     01/11 0701 - 01/12 0700 01/12 0701 - 01/13 0700   I.V. (mL/kg) 1315 (11.7) 152.5 (1.4)   NG/GT 410 60   IV Piggyback 866 466   Total Intake(mL/kg) 2591 (23.1) 678.5 (6)   Urine (mL/kg/hr) 515 (0.2) 45 (0.1)   Emesis/NG output 40 (0)    Other 2365 (0.9) 341 (0.8)   Total Output 2920 386   Net -329 +292.5          PHYSICAL EXAMINATION: General: ill appearing Neuro: RASS  -2, opens eyes with stimulation HEENT: ETT in place Cardiovascular:  s1 s2 rrr Lungs: coarse Abdomen: soft, wound site clean, distended Musculoskeletal: 2+ edema Skin: dopplerable pulses  LABS:  CBC  Recent Labs Lab 07/12/13 0338 07/13/13 0330 07/14/13 0411  WBC 13.2* 4.8 8.3  HGB 11.2* 10.0* 9.8*  HCT 33.9* 29.6* 28.2*  PLT 75* 61* 79*   Coag's  Recent Labs Lab 07/10/13 1431  APTT 33  INR 1.34   BMET  Recent Labs Lab 07/13/13 0330 07/13/13 1600 07/14/13 0411  NA 131* 130* 131*  K 5.1 5.2 4.7  CL 94* 94* 95*  CO2 20 18* 21  BUN 72* 76* 64*  CREATININE 7.14* 7.07* 5.44*  GLUCOSE 133* 111* 139*   Electrolytes  Recent Labs Lab 07/10/13 1431 07/11/13 0500  07/13/13 0330 07/13/13 1600 07/14/13 0400 07/14/13 0411  CALCIUM 7.2* 7.1*  < > 7.5* 7.9*  --  8.2*  MG 1.3* 1.6  --   --   --   --  2.4  PHOS  --   --   < > 6.6* 6.7* 6.2*  --   < > = values in this interval not displayed. ABG  Recent Labs Lab 07/10/13 1647 07/11/13 0401 07/13/13 0936  PHART 7.280* 7.327* 7.339*  PCO2ART 51.5* 42.4 31.8*  PO2ART 99.0 65.0* 76.0*  Liver Enzymes  Recent Labs Lab 07/11/13 0500 07/12/13 0338 07/13/13 0330 07/13/13 1600 07/14/13 0411  AST 64* 59*  --   --  23  ALT 25 29  --   --  23  ALKPHOS 71 67  --   --  84  BILITOT 1.3* 1.0  --   --  1.0  ALBUMIN 2.7* 2.5* 2.3* 2.3* 2.2*   Glucose  Recent Labs Lab 07/13/13 0813 07/13/13 1158 07/13/13 1621 07/13/13 1957 07/14/13 0009 07/14/13 0416  GLUCAP 99 110* 92 91 115* 138*    Imaging Dg Chest Port 1 View  07/13/2013   CLINICAL DATA:  Central line placement  EXAM: PORTABLE CHEST - 1 VIEW  COMPARISON:  07/13/2013 at 0743 hr  FINDINGS: Cardiomegaly with mild interstitial edema. Mild patchy bibasilar opacities, likely atelectasis. Small left pleural effusion. No pneumothorax.  Endotracheal tube terminates 5 cm above the carina.  Interval removal of prior left IJ venous catheter with stable left IJ  venous sheath. Interval placement of a new left IJ venous catheter with its tip in the mid SVC.  Postsurgical changes related to prior CABG.  IMPRESSION: Interval placement of new left IJ venous catheter with its tip in the mid SVC. No pneumothorax.  Cardiomegaly with mild interstitial edema and suspected small left pleural effusion.  Associated bibasilar opacities, likely atelectasis.   Electronically Signed   By: Julian Hy M.D.   On: 07/13/2013 12:11   Dg Chest Port 1 View  07/13/2013   CLINICAL DATA:  73 year old male with respiratory difficulty. Abdominal aortic aneurysm repair.  EXAM: PORTABLE CHEST - 1 VIEW  COMPARISON:  07/12/2013 and prior radiographs  FINDINGS: An endotracheal tube with tip 4 cm above the carina, left IJ Swan-Ganz catheter with tip overlying the main/right pulmonary artery, and NG tube entering the stomach again noted.  Cardiomegaly, mild interstitial edema and right basilar atelectasis again noted.  Left basilar atelectasis is slightly increased.  Cardiomegaly and cardiac surgical changes again identified.  There is no evidence of pneumothorax.  IMPRESSION: Slightly increased left basilar atelectasis without other significant change. Support apparatus and interstitial edema as described.   Electronically Signed   By: Hassan Rowan M.D.   On: 07/13/2013 09:39   ASSESSMENT / PLAN:  PULMONARY A:  Acute respiratory failure 2nd to shock, metabolic derangements, atelectasis. Hx of OSA, COPD. Right > left airspace disease. Ddx: HCAP, evolving TRALI, volume overload (favor volume) P:   -abg reviewed from 11th, now on PCV, repeat abg now, ensure dont need redcution in MV as CVVVHD started -After abg reviewed, will consider SBT if to pssp 5 successful -pcxr in am  -abg in am   CARDIOVASCULAR A:  S/p resection and grafting of perirenal AAA  1/8 . Hx of CAD and severe AS s/p CABG and AVR w/ Bioprosthetic valve 4/14, last EF 55-60% 8/14. Hx of HTN. >remains pressor dependant   P:  -MAP goal 65, will use neo to this goal -assess tsh, cortisol -goal to dc dopamine, no role -cvp assessment needed  RENAL A:  Acute kidney injury. Hx of CKD stage 3A. P:   -start HD 1/11 -per rebal -lasix per renal  GASTROINTESTINAL A:   Post op ileus. Nutrition. Hx of GERD. P:   -start nutrition  -protonix for SUP -lft in am   HEMATOLOGIC A:   Acute blood loss anemia after AAA repair. Thrombocytopenia, MODS, dilution, consumption P:  -f/u CBC -[place] heparin   INFECTIOUS A:   Fever, new RLL  airspace disease  P:   -respiratory culture pending -Maintain HCAP coverage -trend PCT may be helpful -if no mrsa, enteroccus, dc zyvox with plat noted  ENDOCRINE A:   DM w/ hyperglycemia  P:   -SSI -assess cortisol  NEUROLOGIC A:   Acute encephalopathy 2nd to shock, renal failure, respiratory failure.  P:   -sedation protocol >> goal RASS -1  Dc precedex with shock wua  Dc precedex, dc dopamine, cvvhd  CC time 35 minutes.  Lavon Paganini. Titus Mould, MD, [place] Pgr: [facility]

## 2013-07-14 NOTE — Progress Notes (Signed)
Patient ID: [name], male   DOB: May 15, 1941, 73 y.o.   MRN: [id number] [facility]  Patient name: [name] Medical record number: [id number] Date of birth: 09-14-1940 Age: 73 y.o. Gender: male  Primary Care Provider: [name], MD   Subjective: Patient afebrile overnight. Urine culture resulting in enterococcus species. Patient currently on zosyn and zyvox. Pt awake and denies pain.   Objective: Temp:  [97.9 F (36.6 C)-102.3 F (39.1 C)] 98.3 F (36.8 C) (01/12 0400) Pulse Rate:  [96-131] 102 (01/12 0630) Resp:  [0-36] 27 (01/12 0630) BP: (87-133)/(47-79) 90/47 mmHg (01/12 0600) SpO2:  [89 %-100 %] 96 % (01/12 0630) Arterial Line BP: (99-149)/(56-78) 112/63 mmHg (01/12 0630) FiO2 (%):  [50 %-100 %] 60 % (01/12 0400) Weight:  [247 lb 5.7 oz (112.2 kg)] 247 lb 5.7 oz (112.2 kg) (01/12 0451)  Intake/Output Summary (Last 24 hours) at 07/14/13 0732 Last data filed at 07/14/13 0500  Gross per 24 hour  Intake 2439.8 ml  Output   2612 ml  Net -172.2 ml   CRT: 75-100 ml/h ~300 dark yellow urine in foley bag.  490 UOP yesterday  Physical Exam: General: Intubated. Responds appropriately with head shake to questions.  Cardiovascular: RRR. Mildly tachy intermittently  Respiratory: Coarse rhonchi bilaterally. Abdomen: Soft. Distended. BS not appreciated. Extremities: No erythema or +1-2 edema.  Laboratory:  Recent Labs Lab 07/12/13 0338 07/13/13 0330 07/14/13 0411  WBC 13.2* 4.8 8.3  HGB 11.2* 10.0* 9.8*  HCT 33.9* 29.6* 28.2*  PLT 75* 61* 79*    Recent Labs Lab 07/11/13 0500 07/12/13 0338 07/13/13 0330 07/13/13 1600 07/14/13 0411  NA 135* 130* 131* 130* 131*  K 4.6 4.8 5.1 5.2 4.7  CL 101 95* 94* 94* 95*  CO2 22 18* 20 18* 21  BUN 41* 51* 72* 76* 64*  CREATININE 3.07* 5.15* 7.14* 7.07* 5.44*  CALCIUM 7.1* 7.0* 7.5* 7.9* 8.2*  PROT 5.1* 5.4*  --   --  5.7*  BILITOT 1.3* 1.0  --   --  1.0  ALKPHOS 71 67  --   --  84  ALT 25 29  --    --  23  AST 64* 59*  --   --  23  GLUCOSE 245* 166* 133* 111* 139*      Imaging/Diagnostic Tests: Dg Chest Port 1 View  07/13/2013   CLINICAL DATA:  Central line placement  EXAM: PORTABLE CHEST - 1 VIEW  COMPARISON:  07/13/2013 at 0743 hr  FINDINGS: Cardiomegaly with mild interstitial edema. Mild patchy bibasilar opacities, likely atelectasis. Small left pleural effusion. No pneumothorax.  Endotracheal tube terminates 5 cm above the carina.  Interval removal of prior left IJ venous catheter with stable left IJ venous sheath. Interval placement of a new left IJ venous catheter with its tip in the mid SVC.  Postsurgical changes related to prior CABG.  IMPRESSION: Interval placement of new left IJ venous catheter with its tip in the mid SVC. No pneumothorax.  Cardiomegaly with mild interstitial edema and suspected small left pleural effusion.  Associated bibasilar opacities, likely atelectasis.   Electronically Signed   By: Julian Hy M.D.   On: 07/13/2013 12:11   Dg Chest Port 1 View  07/13/2013   CLINICAL DATA:  73 year old male with respiratory difficulty. Abdominal aortic aneurysm repair.  EXAM: PORTABLE CHEST - 1 VIEW  COMPARISON:  07/12/2013 and prior radiographs  FINDINGS: An endotracheal tube with tip 4 cm above the carina, left IJ Swan-Ganz catheter  with tip overlying the main/right pulmonary artery, and NG tube entering the stomach again noted.  Cardiomegaly, mild interstitial edema and right basilar atelectasis again noted.  Left basilar atelectasis is slightly increased.  Cardiomegaly and cardiac surgical changes again identified.  There is no evidence of pneumothorax.  IMPRESSION: Slightly increased left basilar atelectasis without other significant change. Support apparatus and interstitial edema as described.   Electronically Signed   By: Hassan Rowan M.D.   On: 07/13/2013 09:39   Assessment:  1.) POD #2 s/p AAA repair, remains ventilated / sedated, on presors  2.) Acute On chronic  renal failure s/p AAA repair - acute injury secondary to hypotension, cross clamping of renal arteries (ischemic insults); pt did also receive vancomycin postop. FENa 3 suggests ATN. Baseline Cr ~1.5 in the past; hx ARF history with previous peak of 4.73, improving today with CRT 5.44. CRT started 1/11. UOP 490/d. 3.) CHF  4.) DM  5.) CAD 6. Enterococcus UTI: Awaiting speciation and sensitivities. Patient currently receiving zosyn and zyvox. Afebrile overnight.    Plan:  1.) Renal function BID while on CRT. If CMP is ordered then will need to add phos lab.  2.) Close BP monitoring, pressors per CCM / surgery  3.) Strict I/O  4.) CRT 0-100 ml/hr   Ma Hillock, DO 07/14/2013, 7:32 AM PGY-2

## 2013-07-14 NOTE — Progress Notes (Signed)
OT Cancellation/Discharge Note  Patient Details Name: [name] MRN: [id number] DOB: 30-Apr-1941   Cancelled Treatment:    Reason Eval/Treat Not Completed: Patient not medically ready.  Remains sedated on vent requiring pressors. Now with femoral dialysis catheter   Lucille Passy, OTR/L [id number]  07/14/2013, 10:31 AM

## 2013-07-14 NOTE — Progress Notes (Signed)
Patient ID: [name], male   DOB: [birth year], 73 y.o.   MRN: [id number] Vascular Surgery Progress Note  Subjective: 4 days post resection and grafting of peri-renal abdominal aortic aneurysm with aortobiiliac graft. Patient remains on full ventilatory support. Hemodialysis initiated yesterday afternoon through left femoral dialysis catheter. Patient continues to make 10-15 cc per hour of urine. Remains on a dopamine drip at 5 mcg per kilogram per minute with slow weaning of Neo-Synephrine drip to 10 mcg per minute.  Objective:  Filed Vitals:   07/14/13 0848  BP:   Pulse:   Temp: 98.1 F (36.7 C)  Resp:     Gen. alert and responds to questions appropriately-with endotracheal tube in place-on ventilator-nasogastric tube in place Lungs bilateral rhonchi Cardiac-rate equals 100 per minute-sinus tach Abdomen-softer with no inappropriate tenderness or rebound Right inguinal wound healing satisfactorily-no drainage Minimal mottling distally with palpable posterior tibial pulses bilaterally   Labs:  Recent Labs Lab 07/13/13 0330 07/13/13 1600 07/14/13 0411  CREATININE 7.14* 7.07* 5.44*    Recent Labs Lab 07/13/13 0330 07/13/13 1600 07/14/13 0411  NA 131* 130* 131*  K 5.1 5.2 4.7  CL 94* 94* 95*  CO2 20 18* 21  BUN 72* 76* 64*  CREATININE 7.14* 7.07* 5.44*  GLUCOSE 133* 111* 139*  CALCIUM 7.5* 7.9* 8.2*    Recent Labs Lab 07/12/13 0338 07/13/13 0330 07/14/13 0411  WBC 13.2* 4.8 8.3  HGB 11.2* 10.0* 9.8*  HCT 33.9* 29.6* 28.2*  PLT 75* 61* 79*    Recent Labs Lab 07/10/13 1431  INR 1.34    I/O last 3 completed shifts: In: 3473.3 [I.V.:1975.3; NG/GT:500; IV Piggyback:998] Out: 3400 [Urine:895; Emesis/NG output:140; Other:2365]  Imaging: Dg Chest Port 1 View  07/13/2013   CLINICAL DATA:  Central line placement  EXAM: PORTABLE CHEST - 1 VIEW  COMPARISON:  07/13/2013 at 0743 hr  FINDINGS: Cardiomegaly with mild interstitial edema. Mild patchy bibasilar  opacities, likely atelectasis. Small left pleural effusion. No pneumothorax.  Endotracheal tube terminates 5 cm above the carina.  Interval removal of prior left IJ venous catheter with stable left IJ venous sheath. Interval placement of a new left IJ venous catheter with its tip in the mid SVC.  Postsurgical changes related to prior CABG.  IMPRESSION: Interval placement of new left IJ venous catheter with its tip in the mid SVC. No pneumothorax.  Cardiomegaly with mild interstitial edema and suspected small left pleural effusion.  Associated bibasilar opacities, likely atelectasis.   Electronically Signed   By: Julian Hy M.D.   On: 07/13/2013 12:11   Dg Chest Port 1 View  07/13/2013   CLINICAL DATA:  73 year old male with respiratory difficulty. Abdominal aortic aneurysm repair.  EXAM: PORTABLE CHEST - 1 VIEW  COMPARISON:  07/12/2013 and prior radiographs  FINDINGS: An endotracheal tube with tip 4 cm above the carina, left IJ Swan-Ganz catheter with tip overlying the main/right pulmonary artery, and NG tube entering the stomach again noted.  Cardiomegaly, mild interstitial edema and right basilar atelectasis again noted.  Left basilar atelectasis is slightly increased.  Cardiomegaly and cardiac surgical changes again identified.  There is no evidence of pneumothorax.  IMPRESSION: Slightly increased left basilar atelectasis without other significant change. Support apparatus and interstitial edema as described.   Electronically Signed   By: Hassan Rowan M.D.   On: 07/13/2013 09:39    Assessment/Plan:  POD #4  LOS: 4 days  s/p Procedure(s): Resection and Graftiong of perirenal AAA; Insertion 14 x 8  Hemashield Graft Aorta to Left Common Iliac and to Right Common Femoral Artery With Ligastion of Right External and Interanl Iliac Artery  The patient continues to make appropriate progress with need for ventilatory support due to fluid overload pulmonary congestion  Remains alert and follows commands  appropriately Urine output continues to be low but urine is clear and yesterday urine output was 550 cc for 24 hours with Lasix 160 mg 3 times a day  #1 renal-renal function is stable currently being dialyzed with potassium 4.8 and urine output 10-20 cc per hour #2 GI patient receiving 10 cc tube feeds per hour-trickle----abdomen sofer--- NG output had diminished to 100-200 cc per shift #3 vascular-palpable pulses in both lower extremities with satisfactory healing of wounds #4 hematologic--- WBC decreased to 8300 today from 13 K. over weekend.-Temperature down-patient on Primaxin and Zyvox---enterococcus cultured from urine-sensitivities pending #5-pulmonary-patient stable on a ventilator with full ventilatory support   Tinnie Gens, MD 07/14/2013 8:56 AM

## 2013-07-14 NOTE — Progress Notes (Signed)
Pharmacy: Primaxin  72yom started on primaxin yesterday for HCAP. Serum creatinine with sharp increase yesterday requiring dialysis. However, since he is hypotensive on two pressors, CRRT was initiated. Today he continues on CRRT so will need to adjust primaxin dose.  Primaxin 1/11 >> Zyvox 1/11>>  1/9 Urine Cx >> 30k enterococcus 1/10 Trach asp >>   Plan: 1) Change primaxin to 250mg  IV q6 2) Continue to follow renal plans  Nena Jordan, PharmD, BCPS 07/14/2013, 9:32 AM

## 2013-07-14 NOTE — Progress Notes (Addendum)
NUTRITION CONSULT/FOLLOW UP  INTERVENTION: Vital High Protein formula -- initiate at 20 ml/hr and increase by 10 ml every 4 hours to goal rate of 60 ml/hr with Prostat liquid protein 30 ml daily via tube to provide 1540 kcals (66% of estimated kcal needs), 141 gm protein (100% of estimated protein needs), 1204 ml of free water RD to follow for nutrition care plan  NUTRITION DIAGNOSIS: Inadequate oral intake related to inability to eat as evidenced by NPO status, ongoing  Goal: EN to provide 60-70% of estimated calorie needs (22-25 kcals/kg ideal body weight) and 100% of estimated protein needs, based on ASPEN guidelines for permissive underfeeding in critically ill obese individuals, progressing  Monitor:  EN regimen & tolerance, respiratory status, weight, labs, I/O's  ASSESSMENT: Patient with PMH of OSA, CAD, prior CABG and AVR; presented for surgery with diagnosis of AAA.  Patient s/p procedure 1/8: RESECTION AND GRAFTING OF PERIRENAL AAA  Patient is currently intubated on ventilator support MV: 16.3 L/min Temp (24hrs), Avg:98.5 F (36.9 C), Min:97.9 F (36.6 C), Max:100 F (37.8 C)   Vital AF 1.2 formula initiated via Adult Tube Feeding Protocol 1/11 -- currently infusing at 20 ml/hr via NGT.  Central venous HD catheter inserted & CRRT initiated 1/11.  RD consulted for EN initiation & management.  Patient is currently receiving Zyvox, a medication with potential interactions with high tyramine-containing foods.  RD will readdress if pt continues on this medication and is advanced to a PO diet.   Height: Ht Readings from Last 1 Encounters:  07/10/13 5' 5.5" (1.664 m)    Weight: Wt Readings from Last 1 Encounters:  07/14/13 247 lb 5.7 oz (112.2 kg)    Re-estimated needs: Kcal: 2345 Protein: 130-140 gm Fluid: per MD  Skin: abdominal surgical incision  Diet Order: NPO   Intake/Output Summary (Last 24 hours) at 07/14/13 0942 Last data filed at 07/14/13 0900   Gross per 24 hour  Intake 2238.5 ml  Output   3044 ml  Net -805.5 ml    Labs:   Recent Labs Lab 07/10/13 1431 07/11/13 0500  07/13/13 0330 07/13/13 1600 07/14/13 0400 07/14/13 0411  NA 136* 135*  < > 131* 130*  --  131*  K 5.4* 4.6  < > 5.1 5.2  --  4.7  CL 102 101  < > 94* 94*  --  95*  CO2 22 22  < > 20 18*  --  21  BUN 31* 41*  < > 72* 76*  --  64*  CREATININE 1.71* 3.07*  < > 7.14* 7.07*  --  5.44*  CALCIUM 7.2* 7.1*  < > 7.5* 7.9*  --  8.2*  MG 1.3* 1.6  --   --   --   --  2.4  PHOS  --   --   < > 6.6* 6.7* 6.2*  --   GLUCOSE 285* 245*  < > 133* 111*  --  139*  < > = values in this interval not displayed.  CBG (last 3)   Recent Labs  07/13/13 1957 07/14/13 0009 07/14/13 0416  GLUCAP 91 115* 138*    Scheduled Meds: . antiseptic oral rinse  15 mL Mouth Rinse QID  . arformoterol  15 mcg Nebulization BID  . aspirin  324 mg Per NG tube Daily  . budesonide (PULMICORT) nebulizer solution  0.5 mg Nebulization BID  . chlorhexidine  15 mL Mouth Rinse BID  . Chlorhexidine Gluconate Cloth  6 each Topical Daily  .  furosemide  160 mg Intravenous TID  . heparin  1,000 Units Intravenous Once  . heparin subcutaneous  5,000 Units Subcutaneous Q8H  . imipenem-cilastatin  250 mg Intravenous Q6H  . insulin aspart  0-15 Units Subcutaneous Q4H  . linezolid  600 mg Intravenous Q12H  . pantoprazole (PROTONIX) IV  40 mg Intravenous QHS    Continuous Infusions: . sodium chloride 10 mL/hr at 07/13/13 2100  . dexmedetomidine 1.2 mcg/kg/hr (07/14/13 0821)  . DOPamine 5 mcg/kg/min (07/13/13 1900)  . feeding supplement (VITAL AF 1.2 CAL) 1,000 mL (07/13/13 2100)  . phenylephrine (NEO-SYNEPHRINE) Adult infusion 10 mcg/min (07/13/13 2100)  . dialysis replacement fluid (prismasate) 500 mL/hr at 07/14/13 0159  . dialysis replacement fluid (prismasate) 200 mL/hr at 07/13/13 1600  . dialysate (PRISMASATE) 1,500 mL/hr at 07/14/13 0203    Past Medical History  Diagnosis Date  .  Hypertension   . Hyperlipidemia   . Prediabetes   . CHF (congestive heart failure) 07/2012; 10/17/2012  . COPD (chronic obstructive pulmonary disease)     a.  History of heavy smoking. PFTs (4/14) with mixed obstructive (COPD) and restrictive (post-ARDS) picture.   . Pneumonia 07/2012    PNA (H1N1 influenza + pneumococcus) in [id number] complicated by respiratory failure/ARDS. Required tracheostomy, now weaned off. Had left empyema requiring chest tube.   . Diabetes mellitus without complication   . History of blood transfusion     "lots since January" (10/17/2012)  . Ischemic cardiomyopathy   . Anemia of chronic disease   . Aortic stenosis     Moderate to severe by echo in 4/14. Bioprosthetic #21 [facility] Ease aortic valve replacement in 4/14.   Marland Kitchen CAD (coronary artery disease)     a. 4/14 CABG: LIMA-LAD, seq SVG-ramus & OM1, SVG-PDA  . Carotid arterial disease     Carotid dopplers (0000000) with [id number] LICA stenosis.   Marland Kitchen AAA (abdominal aortic aneurysm)     5/14 CT showed > 6 cm AAA, also right iliac aneurysm. Not stent graft candidate.   . Esophageal reflux   . Ischemic cardiomyopathy     a. 4/14 LHC: pLAD 95, ost ramus 70-80, mLCx 90, EF 30%  . Chronic systolic heart failure     a. 1/14 ECHO: sev dil LV, EF30-35%, diff HK, mild MR, AS severe, AV grad 35 b. 8/14 ECHO: EF 55-60%, mild biopros AV sten mn grad. 25, RV mild dil, RA mild dil  . Complication of anesthesia     during last surgery had to be given special medicine b/c "something dropped" during surgery   . Anxiety     pt. admits that he has anxiety at times   . Shortness of breath     still goes deer hunting by himself  . OSA (obstructive sleep apnea)     on cpap- every sleep time.   . Chronic kidney disease     increased creatinine recently - being followed, near kidney failure fr. contrast dye   . Prostate cancer      s/p radiation treatment. - (PT. DENIES)Has indwelling foley.   . Arthritis     Past Surgical History   Procedure Laterality Date  . Transurethral microwave therapy  10/15/2012  . Nasal fracture surgery  1970's  . Tracheostomy  07/2012  . Tracheostomy closure  08/2012  . Anal fissure repair  2008  . Coronary artery bypass graft N/A 10/25/2012    Procedure: CORONARY ARTERY BYPASS GRAFTING (CABG);  Surgeon: Melrose Nakayama, MD;  Location: [facility]  OR;  Service: Open Heart Surgery;  Laterality: N/A;  times 4 using left internal mammary artery and endoscopically harvested bilateral saphenous vein   . Aortic valve replacement N/A 10/25/2012    Procedure: AORTIC VALVE REPLACEMENT (AVR);  Surgeon: Melrose Nakayama, MD;  Location: [place];  Service: Open Heart Surgery;  Laterality: N/A;  . Intraoperative transesophageal echocardiogram N/A 10/25/2012    Procedure: INTRAOPERATIVE TRANSESOPHAGEAL ECHOCARDIOGRAM;  Surgeon: Melrose Nakayama, MD;  Location: [place];  Service: Open Heart Surgery;  Laterality: N/A;  . Cardiac valve replacement      Arthur Holms, RD, LDN Pager #: [phone] After-Hours Pager #: [phone]

## 2013-07-14 NOTE — Progress Notes (Signed)
I have seen and examined this patient and agree with the plan of care [facility] W 07/14/2013, 10:37 AM

## 2013-07-14 NOTE — Progress Notes (Signed)
PT Cancellation/Discharge Note  Patient Details Name: [name] MRN: [id number] DOB: 1940-10-22   Cancelled Treatment:    Reason Eval Not Completed: Patient not medically ready. Remains sedated on vent requiring pressors. Now with femoral dialysis catheter.  PT signing off. Please re-order when activity becomes appropriate.   [name] 07/14/2013, 9:11 AM Pager [phone]

## 2013-07-15 ENCOUNTER — Inpatient Hospital Stay (HOSPITAL_COMMUNITY): Payer: Medicare Other

## 2013-07-15 LAB — COMPREHENSIVE METABOLIC PANEL
ALT: 24 U/L (ref 0–53)
AST: 30 U/L (ref 0–37)
Albumin: 2 g/dL — ABNORMAL LOW (ref 3.5–5.2)
Alkaline Phosphatase: 132 U/L — ABNORMAL HIGH (ref 39–117)
BILIRUBIN TOTAL: 1 mg/dL (ref 0.3–1.2)
BUN: 54 mg/dL — ABNORMAL HIGH (ref 6–23)
CALCIUM: 8 mg/dL — AB (ref 8.4–10.5)
CHLORIDE: 99 meq/L (ref 96–112)
CO2: 23 meq/L (ref 19–32)
Creatinine, Ser: 3.81 mg/dL — ABNORMAL HIGH (ref 0.50–1.35)
GFR, EST AFRICAN AMERICAN: 17 mL/min — AB (ref >90)
GFR, EST NON AFRICAN AMERICAN: 15 mL/min — AB (ref >90)
GLUCOSE: 125 mg/dL — AB (ref 70–99)
Potassium: 4 mEq/L (ref 3.7–5.3)
Sodium: 136 mEq/L — ABNORMAL LOW (ref 137–147)
Total Protein: 5.4 g/dL — ABNORMAL LOW (ref 6.0–8.3)

## 2013-07-15 LAB — URINE CULTURE: Colony Count: 30000

## 2013-07-15 LAB — POCT I-STAT 3, ART BLOOD GAS (G3+)
Acid-base deficit: 1 mmol/L (ref 0.0–2.0)
Bicarbonate: 23.7 mEq/L (ref 20.0–24.0)
O2 Saturation: 93 %
PCO2 ART: 40 mmHg (ref 35.0–45.0)
PH ART: 7.381 (ref 7.350–7.450)
TCO2: 25 mmol/L (ref 0–100)
pO2, Arterial: 70 mmHg — ABNORMAL LOW (ref 80.0–100.0)

## 2013-07-15 LAB — RENAL FUNCTION PANEL
Albumin: 2.1 g/dL — ABNORMAL LOW (ref 3.5–5.2)
BUN: 63 mg/dL — ABNORMAL HIGH (ref 6–23)
CO2: 23 meq/L (ref 19–32)
Calcium: 7.8 mg/dL — ABNORMAL LOW (ref 8.4–10.5)
Chloride: 101 mEq/L (ref 96–112)
Creatinine, Ser: 4.2 mg/dL — ABNORMAL HIGH (ref 0.50–1.35)
GFR calc non Af Amer: 13 mL/min — ABNORMAL LOW (ref >90)
GFR, EST AFRICAN AMERICAN: 15 mL/min — AB (ref >90)
GLUCOSE: 113 mg/dL — AB (ref 70–99)
POTASSIUM: 3.9 meq/L (ref 3.7–5.3)
Phosphorus: 3.8 mg/dL (ref 2.3–4.6)
Sodium: 138 mEq/L (ref 137–147)

## 2013-07-15 LAB — GLUCOSE, CAPILLARY
GLUCOSE-CAPILLARY: 86 mg/dL (ref 70–99)
GLUCOSE-CAPILLARY: 92 mg/dL (ref 70–99)
Glucose-Capillary: 102 mg/dL — ABNORMAL HIGH (ref 70–99)
Glucose-Capillary: 140 mg/dL — ABNORMAL HIGH (ref 70–99)

## 2013-07-15 LAB — CBC WITH DIFFERENTIAL/PLATELET
Basophils Absolute: 0 10*3/uL (ref 0.0–0.1)
Basophils Relative: 0 % (ref 0–1)
EOS PCT: 2 % (ref 0–5)
Eosinophils Absolute: 0.3 10*3/uL (ref 0.0–0.7)
HEMATOCRIT: 25.4 % — AB (ref 39.0–52.0)
HEMOGLOBIN: 8.8 g/dL — AB (ref 13.0–17.0)
LYMPHS ABS: 0.5 10*3/uL — AB (ref 0.7–4.0)
LYMPHS PCT: 4 % — AB (ref 12–46)
MCH: 29.7 pg (ref 26.0–34.0)
MCHC: 34.6 g/dL (ref 30.0–36.0)
MCV: 85.8 fL (ref 78.0–100.0)
MONO ABS: 0.8 10*3/uL (ref 0.1–1.0)
Monocytes Relative: 7 % (ref 3–12)
NEUTROS ABS: 9.4 10*3/uL — AB (ref 1.7–7.7)
Neutrophils Relative %: 86 % — ABNORMAL HIGH (ref 43–77)
Platelets: 92 10*3/uL — ABNORMAL LOW (ref 150–400)
RBC: 2.96 MIL/uL — AB (ref 4.22–5.81)
RDW: 15.5 % (ref 11.5–15.5)
WBC: 10.8 10*3/uL — AB (ref 4.0–10.5)

## 2013-07-15 LAB — PROCALCITONIN: Procalcitonin: 22.72 ng/mL

## 2013-07-15 LAB — PHOSPHORUS: Phosphorus: 3.5 mg/dL (ref 2.3–4.6)

## 2013-07-15 LAB — MAGNESIUM: Magnesium: 2.6 mg/dL — ABNORMAL HIGH (ref 1.5–2.5)

## 2013-07-15 MED ORDER — POLYVINYL ALCOHOL 1.4 % OP SOLN
2.0000 [drp] | Freq: Two times a day (BID) | OPHTHALMIC | Status: DC
  Administered 2013-07-15 – 2013-07-25 (×21): 2 [drp] via OPHTHALMIC
  Filled 2013-07-15 (×2): qty 15

## 2013-07-15 MED ORDER — SODIUM CHLORIDE 0.9 % IV SOLN
250.0000 mg | Freq: Two times a day (BID) | INTRAVENOUS | Status: DC
  Administered 2013-07-15 – 2013-07-16 (×3): 250 mg via INTRAVENOUS
  Filled 2013-07-15 (×3): qty 250

## 2013-07-15 MED ORDER — SODIUM CHLORIDE 0.9 % IV BOLUS (SEPSIS)
250.0000 mL | Freq: Once | INTRAVENOUS | Status: AC
  Administered 2013-07-15: 250 mL via INTRAVENOUS

## 2013-07-15 NOTE — Progress Notes (Addendum)
[facility]  Subjective  - Intubated, but responds to all conversation with head nods.  Post op day 5 resection and grafting of peri-renal abdominal aortic aneurysm with aortobiiliac graft.       Objective 102/59 106 99.1 F (37.3 C) (Oral) 27 99%  Intake/Output Summary (Last 24 hours) at 07/15/13 0737 Last data filed at 07/15/13 0600  Gross per 24 hour  Intake 2808.2 ml  Output   2738 ml  Net   70.2 ml    Alert and responsive with head nods. Abdomin soft with incisional tenderness. No bowel sounds auscultated. Incisions healing well. Doppler signals 2+ DP/PT skin warm to touch.  Assessment/Planning:  Post op day 5 resection and grafting of peri-renal abdominal aortic aneurysm with aortobiiliac graft.    Intubated plan to wean off this am Neo 10 mcg/min. NG tube feeding Heparin for DVT prophylaxis   Laurence Slate [facility] 07/15/2013 7:37 AM --  Laboratory Lab Results:  Recent Labs  07/14/13 0411 07/15/13 0500  WBC 8.3 10.8*  HGB 9.8* 8.8*  HCT 28.2* 25.4*  PLT 79* 92*   BMET  Recent Labs  07/14/13 1500 07/15/13 0500  NA 135* 136*  K 4.3 4.0  CL 99 99  CO2 23 23  GLUCOSE 114* 125*  BUN 55* 54*  CREATININE 4.31* 3.81*  CALCIUM 8.2* 8.0*    COAG Lab Results  Component Value Date   INR 1.34 07/10/2013   INR 1.04 07/04/2013   INR 1.13 11/04/2012   No results found for this basename: PTT   Agree with above assessment Patient is alert and oriented and follows commands Seems to be tolerating ventilator well Abdomen is distended but not tender and incision healing nicely Right inguinal wound is stable with no drainage Bilateral posterior tibial pulses 2+ and palpable with good sensation and motion of both feet  Patient continuing to make approximately 10-20 cc of urine per hour-370 cc past 24 hours Currently on neo Synephrine drip with blood pressure in the 0000000 systolic range  Appreciate CCM and renals  assistance I will be out-of-town for the next 5 days and patient will be followed by Dr. [place] Alar I think he is making steady progress and hopefully renal function will begin to improve send with increased urinary output and this will assist in weaning process from the ventilator. X Talked with the patient's wife yesterday to update her regarding these issues

## 2013-07-15 NOTE — Progress Notes (Signed)
Name: [name] MRN: [id number] DOB: [birth year]    ADMISSION DATE:  07/10/2013 CONSULTATION DATE:  1/8  REFERRING MD :  Kellie Simmering   CHIEF COMPLAINT:  Post-op   BRIEF PATIENT DESCRIPTION:  73 year old male w/ multiple medical co-morbids: OSA, CAD, prior CABG and AVR w/ bioprosthetic valve,  s/p resection and grafting of perirenal AAA 1/8. Intra op course notable for estimated 50 minutes clamp time above the renals. Returned to the ICU post-op on the vent. PCCM asked to assist w/ post-op care.   SIGNIFICANT EVENTS / STUDIES:   1/8: s/p resection and grafting of perirenal AAA 1/8 1/11: change to pressure control 1/11 renal replacement therapy  LINES / TUBES: OETT 1/8>>> Left IJ PCA 1/8>>> Left rad-aline 1/8>>> Right HD 1/11>>>  CULTURES: Urine 1/9 >>>> resp culture 1/10>>>  ANTIBIOTICS: vanc 1/8>>>1/9 Zosyn 1/11>>> zyvox 1/11>>>  SUBJECTIVE:    07/15/13: Still on some neo. CRRT off; reassess tomorrow. Getting tachypneic with SBT. Normal mental status  VITAL SIGNS: Temp:  [97.2 F (36.2 C)-99.1 F (37.3 C)] 98.5 F (36.9 C) (01/13 0800) Pulse Rate:  [77-113] 108 (01/13 0800) Resp:  [20-36] 27 (01/13 0800) BP: (84-128)/(49-72) 103/63 mmHg (01/13 0800) SpO2:  [94 %-100 %] 100 % (01/13 0800) Arterial Line BP: (90-137)/(50-77) 109/56 mmHg (01/13 0800) FiO2 (%):  [40 %] 40 % (01/13 0800) Weight:  [111.9 kg (246 lb 11.1 oz)] 111.9 kg (246 lb 11.1 oz) (01/13 0500) HEMODYNAMICS: CVP:  [8 mmHg-18 mmHg] 8 mmHg VENTILATOR SETTINGS: Vent Mode:  [-] PSV;CPAP FiO2 (%):  [40 %] 40 % Set Rate:  [22 bmp] 22 bmp PEEP:  [5 cmH20] 5 cmH20 Pressure Support:  [10 cmH20] 10 cmH20 Plateau Pressure:  [17 [id number] cmH20] 28 cmH20 INTAKE / OUTPUT: Intake/Output     01/12 0701 - 01/13 0700 01/13 0701 - 01/14 0700   P.O. 120    I.V. (mL/kg) 554.2 (5) 47.6 (0.4)   NG/GT 870 120   IV Piggyback 1264    Total Intake(mL/kg) 2808.2 (25.1) 167.6 (1.5)   Urine (mL/kg/hr) 315 (0.1) 30 (0.1)    Emesis/NG output     Other 2357 (0.9) 194 (0.7)   Total Output 2672 224   Net +136.2 -56.4          PHYSICAL EXAMINATION: General: ill appearing Neuro: RASS o0. CAM-ICU neg for delirium,. Moves all 4s - prn sedations HEENT: ETT in place Cardiovascular:  s1 s2 rrr Lungs: coarse Abdomen: soft, wound site clean, distended Musculoskeletal: 2+ edema Skin: dopplerable pulses  LABS: PULMONARY  Recent Labs Lab 07/10/13 1647 07/11/13 0401 07/13/13 0936 07/14/13 1100 07/15/13 0433  PHART 7.280* 7.327* 7.339* 7.364 7.381  PCO2ART 51.5* 42.4 31.8* 37.5 40.0  PO2ART 99.0 65.0* 76.0* 74.0* 70.0*  HCO3 24.2* 21.8 16.9* 21.4 23.7  TCO2 26 23 18 23 25   O2SAT 97.0 89.0 93.0 94.0 93.0    CBC  Recent Labs Lab 07/13/13 0330 07/14/13 0411 07/15/13 0500  HGB 10.0* 9.8* 8.8*  HCT 29.6* 28.2* 25.4*  WBC 4.8 8.3 10.8*  PLT 61* 79* 92*    COAGULATION  Recent Labs Lab 07/10/13 1431  INR 1.34    CARDIAC  No results found for this basename: TROPONINI,  in the last 168 hours No results found for this basename: PROBNP,  in the last 168 hours   CHEMISTRY  Recent Labs Lab 07/10/13 1431 07/11/13 0500  07/13/13 0330 07/13/13 1600 07/14/13 0400 07/14/13 0411 07/14/13 1500 07/15/13 0500  NA 136* 135*  < >  131* 130*  --  131* 135* 136*  K 5.4* 4.6  < > 5.1 5.2  --  4.7 4.3 4.0  CL 102 101  < > 94* 94*  --  95* 99 99  CO2 22 22  < > 20 18*  --  21 23 23   GLUCOSE 285* 245*  < > 133* 111*  --  139* 114* 125*  BUN 31* 41*  < > 72* 76*  --  64* 55* 54*  CREATININE 1.71* 3.07*  < > 7.14* 7.07*  --  5.44* 4.31* 3.81*  CALCIUM 7.2* 7.1*  < > 7.5* 7.9*  --  8.2* 8.2* 8.0*  MG 1.3* 1.6  --   --   --   --  2.4  --  2.6*  PHOS  --   --   < > 6.6* 6.7* 6.2*  --  4.8* 3.5  < > = values in this interval not displayed. Estimated Creatinine Clearance: 20.7 ml/min (by C-G formula based on Cr of 3.81).   LIVER  Recent Labs Lab 07/10/13 1431  07/11/13 0500 07/12/13 0338  07/13/13 0330 07/13/13 1600 07/14/13 0411 07/14/13 1500 07/15/13 0500  AST  --   --  64* 59*  --   --  23  --  30  ALT  --   --  25 29  --   --  23  --  24  ALKPHOS  --   --  71 67  --   --  84  --  132*  BILITOT  --   --  1.3* 1.0  --   --  1.0  --  1.0  PROT  --   --  5.1* 5.4*  --   --  5.7*  --  5.4*  ALBUMIN  --   < > 2.7* 2.5* 2.3* 2.3* 2.2* 2.0* 2.0*  INR 1.34  --   --   --   --   --   --   --   --   < > = values in this interval not displayed.   INFECTIOUS  Recent Labs Lab 07/14/13 1057 07/15/13 0500  PROCALCITON 23.96 22.72     ENDOCRINE CBG (last 3)   Recent Labs  07/15/13 0018 07/15/13 0356 07/15/13 0728  GLUCAP 92 86 102*         IMAGING x48h  Dg Chest Port 1 View  07/15/2013   CLINICAL DATA:  Status post portable chest x-ray of July 13, 2012 abdominal aortic aneurysm repair and graft placement.  EXAM: PORTABLE CHEST - 1 VIEW  COMPARISON:  Portable chest x-ray of July 13, 2013  FINDINGS: The lungs are reasonably well inflated. The pulmonary interstitial markings remain diffusely increased but are not greatly changed. The left hemidiaphragm is slightly better demonstrated today. The cardiopericardial silhouette remains enlarged. The pulmonary vascularity is prominent centrally but better defined today. The endotracheal tube tip is difficult to demonstrate but appears to lie approximately 4.6 cm above the crotch of the carina. The esophagogastric tube tip and proximal port project off the film. The left internal jugular venous catheter tip appears stable in the region of the proximal SVC.  IMPRESSION: There has been slight interval improvement in the appearance of the pulmonary interstitium in the left hemidiaphragm is better demonstrated. There remains considerable increased interstitial density however throughout both lungs likely reflecting interstitial edema. The support tubes and lines are in adequate position radiographically.   Electronically Signed    By: Shanon Brow  [place]   On: 07/15/2013 08:07   Dg Chest Port 1 View  07/13/2013   CLINICAL DATA:  Central line placement  EXAM: PORTABLE CHEST - 1 VIEW  COMPARISON:  07/13/2013 at 0743 hr  FINDINGS: Cardiomegaly with mild interstitial edema. Mild patchy bibasilar opacities, likely atelectasis. Small left pleural effusion. No pneumothorax.  Endotracheal tube terminates 5 cm above the carina.  Interval removal of prior left IJ venous catheter with stable left IJ venous sheath. Interval placement of a new left IJ venous catheter with its tip in the mid SVC.  Postsurgical changes related to prior CABG.  IMPRESSION: Interval placement of new left IJ venous catheter with its tip in the mid SVC. No pneumothorax.  Cardiomegaly with mild interstitial edema and suspected small left pleural effusion.  Associated bibasilar opacities, likely atelectasis.   Electronically Signed   By: Julian Hy M.D.   On: 07/13/2013 12:11     ASSESSMENT / PLAN:  PULMONARY A:  Acute respiratory failure 2nd to shock, metabolic derangements, atelectasis. Hx of OSA, COPD. Right > left airspace disease. Ddx: HCAP, evolving TRALI, volume overload (favor volume)  07/15/13: Failing SBT due to tachycardia  P:   - Full vent support - Daily SBT   CARDIOVASCULAR A:  S/p resection and grafting of perirenal AAA  1/8 . Hx of CAD and severe AS s/p CABG and AVR w/ Bioprosthetic valve 4/14, last EF 55-60% 8/14. Hx of HTN. >remains pressor dependant  P:  -MAP goal 65, will use neo to this goal -assess tsh, cortisol -goal to dc dopamine, no role -cvp assessment needed  RENAL A:  Acute kidney injury. Hx of CKD stage 3A. STarted HD 07/13/13  07/15/13 On break from CRRT P:   -start HD 1/11 -per rebal  GASTROINTESTINAL A:   Post op ileus. Nutrition. Hx of GERD. P:   - nutrition  -protonix for SUP -lft in am   HEMATOLOGIC A:   Acute blood loss anemia after AAA repair. Thrombocytopenia, MODS, dilution,  consumption P:  -f/u CBC -[place] heparin   INFECTIOUS A:   Fever, new RLL airspace disease  P:   -respiratory culture pending -Maintain HCAP coverage -trend PCT may be helpful -if no mrsa, enteroccus, dc zyvox with plat noted  ENDOCRINE A:   DM w/ hyperglycemia  P:   -SSI -assess cortisol  NEUROLOGIC A:   Acute encephalopathy 2nd to shock, renal failure, respiratory failure.  P:   -sedation protocol >> goal RASS -1  Dc precedex with shock wua   GLOBAL 07/15/13: No family at bedside   The patient is critically ill with multiple organ systems failure and requires high complexity decision making for assessment and support, frequent evaluation and titration of therapies, application of advanced monitoring technologies and extensive interpretation of multiple databases.   Critical Care Time devoted to patient care services described in this note is  35  Minutes.  Dr. Brand Males, M.D., [facility].C.P Pulmonary and Critical Care Medicine Staff Physician [place] [facility] Pager: [phone], If no answer or between  15:00h - 7:00h: call [phone]  07/15/2013 9:42 AM

## 2013-07-15 NOTE — Progress Notes (Signed)
I have seen and examined this patient and agree with the plan of care , More alert but some hypotension  No fluid taken off with CVVHD. Possibility of improved urine output will monitor off CVVHD today [name] 07/15/2013, 11:16 AM

## 2013-07-15 NOTE — Progress Notes (Signed)
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 Brand Males, M.D., [facility].C.P Pulmonary and Critical Care Medicine Staff Physician [place] [facility] Pager: [phone], If no answer or between  15:00h - 7:00h: call [phone]  07/15/2013 11:17 AM

## 2013-07-15 NOTE — Care Management Note (Signed)
    Page 1 of 2   07/25/2013     4:04:55 PM   CARE MANAGEMENT NOTE 07/25/2013  Patient:  [name], [name]   Account Number:  [id number]  Date Initiated:  07/14/2013  Documentation initiated by:  [facility]  Subjective/Objective Assessment:   Post op peri renal abdominal aortic aneurysm repair.  On vent - HD started on 1-10.     Action/Plan:   Anticipated DC Date:  07/23/2013   Anticipated DC Plan:  SKILLED NURSING FACILITY  In-house referral  Clinical Social Worker      DC Planning Services  CM consult      Choice offered to / List presented to:             Status of service:  Completed, signed off Medicare Important Message given?   (If response is "NO", the following Medicare IM given date fields will be blank) Date Medicare IM given:   Date Additional Medicare IM given:    Discharge Disposition:  [place]  Per UR Regulation:  Reviewed for med. necessity/level of care/duration of stay  If discussed at [place] of Stay Meetings, dates discussed:   07/17/2013  07/22/2013    Comments:  Contact:  Mcguirt,Peggy Spouse     [phone]                 Mckinzie,Christy Daughter     [phone]                  Santo Held Daughter     [phone]  07/25/13 Alle Difabio,RN,BSN [id number] PT DISCHARGING TO SNF TODAY, PER CSW ARRANGEMENTS.  07/22/13 Duran Ohern,RN,BSN [id number] PLANNING DC TO SNF 1-2 DAYS, PER MD NOTES.  WILL FOLLOW PROGRESS.  07-18-13 [place] [phone] Talked with patient and wife at bedside.  States I knew that they would like him to go home but that we are still following his renals.  If he will need continued dialysis he may need to go back to [facility].  It will just depend on his progression.  Were very understanding and agreeable to going back if that level  if this would be needed at discharge.  CM will continue to follow.  In past has also been at [facility] SNF.  07-17-13 [facility]  rest for past two - days but creat increasing - may need today.  Remains on vent.  May need to go back to [facility] even though patient and family want him to come home with them.  CM will continue to follow.  07-15-13 2:30pm Luz Lex, RNBSN [phone] Know patient and family well through past admissions. Lives at home with wife and one daughter.  Very supportive family.  has been at [facility] in past.  Loved [facility] but are hoping he will progress and not need this.  CM will continue to follow for progression.  Off CRRT today - failed weaning on vent.

## 2013-07-15 NOTE — Progress Notes (Signed)
CRITICAL VALUE ALERT  Critical value received:  Urine positive for VRE  Date of notification:  07/15/13  Time of notification:  [id number]  Critical value read back:yes  Nurse who received alert:  Vista Lawman, RN  MD notified (1st page):  Dr. Chase Caller  Time of first page:  1057  Responding MD:  Dr. Chase Caller  Time MD responded:  1058

## 2013-07-15 NOTE — Progress Notes (Signed)
CRRT stopped per MD Justin Mend.  CRRT to be off for 24 hours, then need reassessed.  250cc saline bolus given per order.   Will continue to monitor.

## 2013-07-15 NOTE — Progress Notes (Signed)
Dr. Alva Garnet made aware pt with episode of projective vomiting large amount of tube feeds. Tube feeds off and NG to low wall intermittent suction. PRN zofran given. Per pt, he didn't feel sick, the vomiting episode came on quickly. Will leave TF off for now per MD. Will continue to monitor closely. Fulton Reek, RN

## 2013-07-15 NOTE — Progress Notes (Signed)
Patient ID: [name], male   DOB: Oct 21, 1940, 73 y.o.   MRN: [id number] [facility]  Patient name: [name] Medical record number: [id number] Date of birth: 08-Apr-1941 Age: 73 y.o. Gender: male  Primary Care Provider: [name], MD   Subjective: Patient afebrile overnight. Urine culture resulting in enterococcus species. Patient currently on zosyn and zyvox. Pt awake and denies pain. Agrees he is feeling better.  Objective: Temp:  [97.2 F (36.2 C)-99.1 F (37.3 C)] 99.1 F (37.3 C) (01/13 0000) Pulse Rate:  [77-113] 106 (01/13 0700) Resp:  [20-36] 27 (01/13 0700) BP: (84-128)/(49-77) 102/59 mmHg (01/13 0700) SpO2:  [94 %-100 %] 99 % (01/13 0700) Arterial Line BP: (90-138)/(50-77) 104/53 mmHg (01/13 0700) FiO2 (%):  [40 %-60 %] 40 % (01/13 0400) Weight:  [246 lb 11.1 oz (111.9 kg)] 246 lb 11.1 oz (111.9 kg) (01/13 0500)  Intake/Output Summary (Last 24 hours) at 07/15/13 0748 Last data filed at 07/15/13 0700  Gross per 24 hour  Intake 2808.2 ml  Output   2672 ml  Net  136.2 ml   CRT: even  25cc dark yellow urine in foley bag.  315 UOP yesterday  Physical Exam: General: ETT tube in place Cardiovascular: RRR. Mildly tachy intermittently  Respiratory: Coarse rhonchi bilaterally. Abdomen: Soft. Distended. BS appreciated. Extremities: No erythema or +1 edema. Neuro: More alert. Awake. Trying to speak. Responds appropriately with head nods  Laboratory:  Recent Labs Lab 07/13/13 0330 07/14/13 0411 07/15/13 0500  WBC 4.8 8.3 10.8*  HGB 10.0* 9.8* 8.8*  HCT 29.6* 28.2* 25.4*  PLT 61* 79* 92*    Recent Labs Lab 07/12/13 0338  07/14/13 0411 07/14/13 1500 07/15/13 0500  NA 130*  < > 131* 135* 136*  K 4.8  < > 4.7 4.3 4.0  CL 95*  < > 95* 99 99  CO2 18*  < > 21 23 23   BUN 51*  < > 64* 55* 54*  CREATININE 5.15*  < > 5.44* 4.31* 3.81*  CALCIUM 7.0*  < > 8.2* 8.2* 8.0*  PROT 5.4*  --  5.7*  --  5.4*  BILITOT 1.0  --  1.0  --  1.0   ALKPHOS 67  --  84  --  132*  ALT 29  --  23  --  24  AST 59*  --  23  --  30  GLUCOSE 166*  < > 139* 114* 125*  < > = values in this interval not displayed.    Imaging/Diagnostic Tests: Dg Chest Port 1 View  07/13/2013   CLINICAL DATA:  Central line placement  EXAM: PORTABLE CHEST - 1 VIEW  COMPARISON:  07/13/2013 at 0743 hr  FINDINGS: Cardiomegaly with mild interstitial edema. Mild patchy bibasilar opacities, likely atelectasis. Small left pleural effusion. No pneumothorax.  Endotracheal tube terminates 5 cm above the carina.  Interval removal of prior left IJ venous catheter with stable left IJ venous sheath. Interval placement of a new left IJ venous catheter with its tip in the mid SVC.  Postsurgical changes related to prior CABG.  IMPRESSION: Interval placement of new left IJ venous catheter with its tip in the mid SVC. No pneumothorax.  Cardiomegaly with mild interstitial edema and suspected small left pleural effusion.  Associated bibasilar opacities, likely atelectasis.   Electronically Signed   By: Julian Hy M.D.   On: 07/13/2013 12:11   Dg Chest Port 1 View  07/13/2013   CLINICAL DATA:  73 year old male with respiratory difficulty.  Abdominal aortic aneurysm repair.  EXAM: PORTABLE CHEST - 1 VIEW  COMPARISON:  07/12/2013 and prior radiographs  FINDINGS: An endotracheal tube with tip 4 cm above the carina, left IJ Swan-Ganz catheter with tip overlying the main/right pulmonary artery, and NG tube entering the stomach again noted.  Cardiomegaly, mild interstitial edema and right basilar atelectasis again noted.  Left basilar atelectasis is slightly increased.  Cardiomegaly and cardiac surgical changes again identified.  There is no evidence of pneumothorax.  IMPRESSION: Slightly increased left basilar atelectasis without other significant change. Support apparatus and interstitial edema as described.   Electronically Signed   By: Hassan Rowan M.D.   On: 07/13/2013 09:39   Assessment:   1.) POD #2 s/p AAA repair, remains ventilated / sedated, on presors  2.) Acute On chronic renal failure s/p AAA repair - acute injury secondary to hypotension, cross clamping of renal arteries (ischemic insults); pt did also receive vancomycin postop. FENa 3 suggests ATN. Baseline Cr ~1.5 in the past; hx ARF history with previous peak of 4.73, improving today with CRT. CRT started 1/11. UOP 382ml/d. Cr improving 3.81. Electrolytes look good.  3.) CHF  4.) DM  5.) CAD 6. Enterococcus UTI: Awaiting speciation and sensitivities. Patient currently receiving impenem and zyvox. Afebrile overnight.    Plan:  1.) Renal function BID while on CRT. If CMP is ordered then will need to add phos lab.  2.) Close BP monitoring,  per CCM / surgery.   3.) Strict I/O  4.) Hold on CRT for now d/t BP control. May restart in 24 hours if needed. 250cc NS bolus now will hopefully help with UOP and BP. D/C lasix today.   Ma Hillock, DO 07/15/2013, 7:48 AM PGY-2

## 2013-07-16 ENCOUNTER — Inpatient Hospital Stay (HOSPITAL_COMMUNITY): Payer: Medicare Other

## 2013-07-16 LAB — RENAL FUNCTION PANEL
ALBUMIN: 2 g/dL — AB (ref 3.5–5.2)
Albumin: 2.1 g/dL — ABNORMAL LOW (ref 3.5–5.2)
BUN: 76 mg/dL — AB (ref 6–23)
BUN: 89 mg/dL — AB (ref 6–23)
CO2: 22 meq/L (ref 19–32)
CO2: 23 mEq/L (ref 19–32)
CREATININE: 5.1 mg/dL — AB (ref 0.50–1.35)
Calcium: 8.3 mg/dL — ABNORMAL LOW (ref 8.4–10.5)
Calcium: 8.4 mg/dL (ref 8.4–10.5)
Chloride: 99 mEq/L (ref 96–112)
Chloride: 98 mEq/L (ref 96–112)
Creatinine, Ser: 5.92 mg/dL — ABNORMAL HIGH (ref 0.50–1.35)
GFR calc Af Amer: 12 mL/min — ABNORMAL LOW (ref >90)
GFR calc Af Amer: 10 mL/min — ABNORMAL LOW (ref >90)
GFR calc non Af Amer: 10 mL/min — ABNORMAL LOW (ref >90)
GFR calc non Af Amer: 9 mL/min — ABNORMAL LOW (ref >90)
GLUCOSE: 108 mg/dL — AB (ref 70–99)
Glucose, Bld: 118 mg/dL — ABNORMAL HIGH (ref 70–99)
POTASSIUM: 3.8 meq/L (ref 3.7–5.3)
Phosphorus: 5 mg/dL — ABNORMAL HIGH (ref 2.3–4.6)
Phosphorus: 5.9 mg/dL — ABNORMAL HIGH (ref 2.3–4.6)
Potassium: 4 mEq/L (ref 3.7–5.3)
Sodium: 137 mEq/L (ref 137–147)
Sodium: 136 mEq/L — ABNORMAL LOW (ref 137–147)

## 2013-07-16 LAB — CBC WITH DIFFERENTIAL/PLATELET
Basophils Absolute: 0 10*3/uL (ref 0.0–0.1)
Basophils Relative: 0 % (ref 0–1)
Eosinophils Absolute: 0.3 10*3/uL (ref 0.0–0.7)
Eosinophils Relative: 3 % (ref 0–5)
HCT: 23.8 % — ABNORMAL LOW (ref 39.0–52.0)
Hemoglobin: 8 g/dL — ABNORMAL LOW (ref 13.0–17.0)
LYMPHS PCT: 6 % — AB (ref 12–46)
Lymphs Abs: 0.6 10*3/uL — ABNORMAL LOW (ref 0.7–4.0)
MCH: 29.7 pg (ref 26.0–34.0)
MCHC: 33.6 g/dL (ref 30.0–36.0)
MCV: 88.5 fL (ref 78.0–100.0)
MONOS PCT: 5 % (ref 3–12)
Monocytes Absolute: 0.5 10*3/uL (ref 0.1–1.0)
NEUTROS PCT: 86 % — AB (ref 43–77)
Neutro Abs: 8.5 10*3/uL — ABNORMAL HIGH (ref 1.7–7.7)
PLATELETS: 125 10*3/uL — AB (ref 150–400)
RBC: 2.69 MIL/uL — ABNORMAL LOW (ref 4.22–5.81)
RDW: 15.5 % (ref 11.5–15.5)
WBC: 9.8 10*3/uL (ref 4.0–10.5)

## 2013-07-16 LAB — POCT I-STAT 7, (LYTES, BLD GAS, ICA,H+H)
ACID-BASE EXCESS: 1 mmol/L (ref 0.0–2.0)
Acid-Base Excess: 4 mmol/L — ABNORMAL HIGH (ref 0.0–2.0)
Bicarbonate: 28.9 mEq/L — ABNORMAL HIGH (ref 20.0–24.0)
Bicarbonate: 25.9 mEq/L — ABNORMAL HIGH (ref 20.0–24.0)
CALCIUM ION: 1.18 mmol/L (ref 1.13–1.30)
Calcium, Ion: 1.13 mmol/L (ref 1.13–1.30)
HCT: 38 % — ABNORMAL LOW (ref 39.0–52.0)
HEMATOCRIT: 38 % — AB (ref 39.0–52.0)
HEMOGLOBIN: 12.9 g/dL — AB (ref 13.0–17.0)
Hemoglobin: 12.9 g/dL — ABNORMAL LOW (ref 13.0–17.0)
O2 SAT: 100 %
O2 Saturation: 97 %
PCO2 ART: 43.2 mmHg (ref 35.0–45.0)
PCO2 ART: 41.8 mmHg (ref 35.0–45.0)
PH ART: 7.432 (ref 7.350–7.450)
POTASSIUM: 4.6 meq/L (ref 3.7–5.3)
POTASSIUM: 4.2 meq/L (ref 3.7–5.3)
Patient temperature: 36.4
Sodium: 140 mEq/L (ref 137–147)
Sodium: 138 mEq/L (ref 137–147)
TCO2: 30 mmol/L (ref 0–100)
TCO2: 27 mmol/L (ref 0–100)
pH, Arterial: 7.399 (ref 7.350–7.450)
pO2, Arterial: 84 mmHg (ref 80.0–100.0)
pO2, Arterial: 221 mmHg — ABNORMAL HIGH (ref 80.0–100.0)

## 2013-07-16 LAB — BASIC METABOLIC PANEL
BUN: 76 mg/dL — ABNORMAL HIGH (ref 6–23)
CALCIUM: 8.3 mg/dL — AB (ref 8.4–10.5)
CO2: 22 meq/L (ref 19–32)
Chloride: 100 mEq/L (ref 96–112)
Creatinine, Ser: 5.15 mg/dL — ABNORMAL HIGH (ref 0.50–1.35)
GFR calc Af Amer: 12 mL/min — ABNORMAL LOW (ref >90)
GFR, EST NON AFRICAN AMERICAN: 10 mL/min — AB (ref >90)
GLUCOSE: 118 mg/dL — AB (ref 70–99)
POTASSIUM: 3.8 meq/L (ref 3.7–5.3)
Sodium: 138 mEq/L (ref 137–147)

## 2013-07-16 LAB — GLUCOSE, CAPILLARY
GLUCOSE-CAPILLARY: 116 mg/dL — AB (ref 70–99)
GLUCOSE-CAPILLARY: 104 mg/dL — AB (ref 70–99)
Glucose-Capillary: 97 mg/dL (ref 70–99)
Glucose-Capillary: 103 mg/dL — ABNORMAL HIGH (ref 70–99)

## 2013-07-16 LAB — MAGNESIUM: Magnesium: 2.7 mg/dL — ABNORMAL HIGH (ref 1.5–2.5)

## 2013-07-16 LAB — PROCALCITONIN: PROCALCITONIN: 19.21 ng/mL

## 2013-07-16 MED ORDER — FUROSEMIDE 10 MG/ML IJ SOLN
80.0000 mg | Freq: Four times a day (QID) | INTRAMUSCULAR | Status: DC
  Administered 2013-07-16 – 2013-07-20 (×17): 80 mg via INTRAVENOUS
  Filled 2013-07-16 (×21): qty 8

## 2013-07-16 MED ORDER — BISACODYL 10 MG RE SUPP
10.0000 mg | Freq: Every day | RECTAL | Status: DC
  Administered 2013-07-17 – 2013-07-19 (×3): 10 mg via RECTAL
  Filled 2013-07-16 (×4): qty 1

## 2013-07-16 MED ORDER — DEXTROSE 5 % IV SOLN
1.0000 g | Freq: Three times a day (TID) | INTRAVENOUS | Status: DC
  Administered 2013-07-17 – 2013-07-19 (×8): 1 g via INTRAVENOUS
  Filled 2013-07-16 (×10): qty 1

## 2013-07-16 NOTE — Progress Notes (Signed)
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 Intra op course notable for estimated 50 minutes clamp time above the renals. Returned to the ICU post-op on the vent. PCCM asked to assist w/ post-op care.   SIGNIFICANT EVENTS / STUDIES:   1/8: s/p resection and grafting of perirenal AAA 1/8 07/11/13 ECHO ef 55% 1/11: change to pressure control 1/11 renal replacement therapy 07/15/13: 07/15/13: Still on some neo. CRRT off; reassess tomorrow. Getting tachypneic with SBT. Normal mental status  LINES / TUBES: OETT 1/8>>> Left IJ PCA 1/8>>> Left rad-aline 1/8>>> Right HD 1/11>>>  CULTURES: Urine 1/9 >>>> 30K VRE resp culture 1/10>>> ABUNDDANT PSEUMONOAS  Results for orders placed during the hospital encounter of 07/10/13  URINE CULTURE     Status: None   Collection Time    07/11/13  1:45 PM      Result Value Range Status   Specimen Description URINE, CATHETERIZED   Final   Special Requests NONE   Final   Culture  Setup Time     Final   Value: 07/11/2013 15:28     Performed at [place]     Final   Value: 30,000 COLONIES/ML     Performed at [facility]   Culture     Final   Value: VANCOMYCIN RESISTANT ENTEROCOCCUS ISOLATED     Note: CRITICAL RESULT CALLED TO, READ BACK BY AND VERIFIED WITH: SHANNON LOVE @ 10:53AM 07/15/13 BY DWEEKS     Performed at [facility]   Report Status 07/15/2013 FINAL   Final   Organism ID, Bacteria VANCOMYCIN RESISTANT ENTEROCOCCUS ISOLATED   Final  CULTURE, RESPIRATORY (NON-EXPECTORATED)     Status: None   Collection Time    07/12/13  5:00 PM      Result Value Range Status   Specimen Description TRACHEAL ASPIRATE    Final   Special Requests NONE   Final   Gram Stain     Final   Value: MODERATE WBC PRESENT, PREDOMINANTLY PMN     FEW SQUAMOUS EPITHELIAL CELLS PRESENT     FEW GRAM NEGATIVE RODS     RARE GRAM POSITIVE RODS     Performed at [facility]   Culture     Final   Value: ABUNDANT PSEUDOMONAS AERUGINOSA     Performed at [facility]   Report Status 07/16/2013 FINAL   Final   Organism ID, Bacteria PSEUDOMONAS AERUGINOSA   Final     ANTIBIOTICS: vanc 1/8>>>1/9 Zosyn 1/11>>> zyvox 1/11>>>  SUBJECTIVE:   07/16/13: 5/5 RR 40s and then copiouys think ET tube secretions. CXR looks very wet    VITAL SIGNS: Temp:  [97.8 F (36.6 C)-98.5 F (36.9 C)] 97.8 F (36.6 C) (01/14 0741) Pulse Rate:  [93-120] 98 (01/14 1000) Resp:  [18-39] 34 (01/14 1000) BP: (85-126)/(48-67) 107/61 mmHg (01/14 1000) SpO2:  [84 %-100 %] 97 % (01/14 1000) Arterial Line BP: (82-279)/(46-227) 121/53 mmHg (01/14 1000) FiO2 (%):  [40 %] 40 % (01/14 1000) Weight:  [107.5 kg (236 lb 15.9 oz)] 107.5 kg (236 lb 15.9 oz) (01/14 0500) HEMODYNAMICS: CVP:  [8 mmHg-13 mmHg] 13 mmHg VENTILATOR SETTINGS:  Vent Mode:  [-] CPAP;PSV FiO2 (%):  [40 %] 40 % Set Rate:  [22 bmp] 22 bmp PEEP:  [5 cmH20] 5 cmH20 Pressure Support:  [15 cmH20] 15 cmH20 Plateau Pressure:  [22 [id number] cmH20] 24 cmH20 INTAKE / OUTPUT: Intake/Output     01/13 0701 - 01/14 0700 01/14 0701 - 01/15 0700   P.O.     I.V. (mL/kg) 562.7 (5.2) 60 (0.6)   NG/GT 690 30   IV Piggyback 1050 400   Total Intake(mL/kg) 2302.7 (21.4) 490 (4.6)   Urine (mL/kg/hr) 340 (0.1) 50 (0.1)   Emesis/NG output 1000 (0.4) 100 (0.3)   Other 194 (0.1)    Stool 1 (0)    Total Output 1535 150   Net +767.7 +340          PHYSICAL EXAMINATION: General: ill appearing Neuro: RASS o0. CAM-ICU neg for delirium,. Moves all 4s - prn sedations HEENT: ETT in place Cardiovascular:  s1 s2 rrr Lungs: coarse Abdomen: soft, wound site clean,  distended Musculoskeletal: 2+ edema Skin: dopplerable pulses  LABS: PULMONARY  Recent Labs Lab 07/10/13 1647 07/11/13 0401 07/13/13 0936 07/14/13 1100 07/15/13 0433  PHART 7.280* 7.327* 7.339* 7.364 7.381  PCO2ART 51.5* 42.4 31.8* 37.5 40.0  PO2ART 99.0 65.0* 76.0* 74.0* 70.0*  HCO3 24.2* 21.8 16.9* 21.4 23.7  TCO2 26 23 18 23 25   O2SAT 97.0 89.0 93.0 94.0 93.0    CBC  Recent Labs Lab 07/14/13 0411 07/15/13 0500 07/16/13 0342  HGB 9.8* 8.8* 8.0*  HCT 28.2* 25.4* 23.8*  WBC 8.3 10.8* 9.8  PLT 79* 92* 125*    COAGULATION  Recent Labs Lab 07/10/13 1431  INR 1.34    CARDIAC  No results found for this basename: TROPONINI,  in the last 168 hours No results found for this basename: PROBNP,  in the last 168 hours   CHEMISTRY  Recent Labs Lab 07/10/13 1431 07/11/13 0500  07/14/13 0400 07/14/13 0411 07/14/13 1500 07/15/13 0500 07/15/13 1500 07/16/13 0342  NA 136* 135*  < >  --  131* 135* 136* 138 137  138  K 5.4* 4.6  < >  --  4.7 4.3 4.0 3.9 3.8  3.8  CL 102 101  < >  --  95* 99 99 101 99  100  CO2 22 22  < >  --  21 23 23 23 22  22   GLUCOSE 285* 245*  < >  --  139* 114* 125* 113* 118*  118*  BUN 31* 41*  < >  --  64* 55* 54* 63* 76*  76*  CREATININE 1.71* 3.07*  < >  --  5.44* 4.31* 3.81* 4.20* 5.10*  5.15*  CALCIUM 7.2* 7.1*  < >  --  8.2* 8.2* 8.0* 7.8* 8.3*  8.3*  MG 1.3* 1.6  --   --  2.4  --  2.6*  --  2.7*  PHOS  --   --   < > 6.2*  --  4.8* 3.5 3.8 5.0*  < > = values in this interval not displayed. Estimated Creatinine Clearance: 15.1 ml/min (by C-G formula based on Cr of 5.1).   LIVER  Recent Labs Lab 07/10/13 1431  07/11/13 0500 07/12/13 0338  07/14/13 0411 07/14/13 1500 07/15/13 0500 07/15/13 1500 07/16/13 0342  AST  --   --  64* 59*  --  23  --  30  --   --   ALT  --   --  25 29  --  23  --  24  --   --   ALKPHOS  --   --  71 67  --  84  --  132*  --   --   BILITOT  --   --  1.3* 1.0  --  1.0  --  1.0  --   --   PROT   --   --  5.1* 5.4*  --  5.7*  --  5.4*  --   --   ALBUMIN  --   < > 2.7* 2.5*  < > 2.2* 2.0* 2.0* 2.1* 2.0*  INR 1.34  --   --   --   --   --   --   --   --   --   < > = values in this interval not displayed.   INFECTIOUS  Recent Labs Lab 07/14/13 1057 07/15/13 0500 07/16/13 0342  PROCALCITON 23.96 22.72 19.21     ENDOCRINE CBG (last 3)   Recent Labs  07/15/13 1530 07/16/13 0330 07/16/13 0753  GLUCAP 97 116* 104*         IMAGING x48h  Dg Chest Port 1 View  07/16/2013   CLINICAL DATA:  Check endotracheal tube.  EXAM: PORTABLE CHEST - 1 VIEW  COMPARISON:  DG CHEST 1V PORT dated 07/15/2013; DG CHEST 2 VIEW dated 07/04/2013; DG CHEST 1V PORT dated 07/10/2013; DG CHEST 1V PORT dated 07/12/2013; DG CHEST 1V PORT dated 07/13/2013; DG CHEST 1V PORT dated 07/13/2013  FINDINGS: Endotracheal tube is in satisfactory position. Nasogastric tube is followed into the stomach with the tip projecting beyond the inferior margin of the image. Left IJ central line tip projects over the SVC.  Heart is enlarged, stable. Thoracic aorta is calcified. Pulmonary hila appear prominent as well, unchanged. Diffuse bilateral mixed interstitial and airspace disease persists. No definite pleural fluid.  IMPRESSION: Persistent mixed interstitial and airspace disease, favored represent edema.   Electronically Signed   By: Lorin Picket M.D.   On: 07/16/2013 07:39   Dg Chest Port 1 View  07/15/2013   CLINICAL DATA:  Status post portable chest x-ray of July 13, 2012 abdominal aortic aneurysm repair and graft placement.  EXAM: PORTABLE CHEST - 1 VIEW  COMPARISON:  Portable chest x-ray of July 13, 2013  FINDINGS: The lungs are reasonably well inflated. The pulmonary interstitial markings remain diffusely increased but are not greatly changed. The left hemidiaphragm is slightly better demonstrated today. The cardiopericardial silhouette remains enlarged. The pulmonary vascularity is prominent centrally but better  defined today. The endotracheal tube tip is difficult to demonstrate but appears to lie approximately 4.6 cm above the crotch of the carina. The esophagogastric tube tip and proximal port project off the film. The left internal jugular venous catheter tip appears stable in the region of the proximal SVC.  IMPRESSION: There has been slight interval improvement in the appearance of the pulmonary interstitium in the left hemidiaphragm is better demonstrated. There remains considerable increased interstitial density however throughout both lungs likely reflecting interstitial edema. The support tubes and lines are in adequate position radiographically.   Electronically Signed   By: David  Martinique   On: 07/15/2013 08:07     ASSESSMENT / PLAN:  PULMONARY A:  Acute respiratory failure 2nd to shock, metabolic derangements, atelectasis. Hx of OSA, COPD. Right > left airspace disease. Ddx: HCAP, evolving TRALI, volume overload (favor volume)  07/15/13: Failing SBT due to tachypnea ? Volume overload v Pna  P:   -  Full vent support - Daily SBT -- Consider extubate to bipap   CARDIOVASCULAR A:  S/p resection and grafting of perirenal AAA  1/8 . Hx of CAD and severe AS s/p CABG and AVR w/ Bioprosthetic valve 4/14, last EF 55-60% 8/14. Hx of HTN.  07/16/13: Off pressors x 24h    P:  -MAP goal 65  RENAL A:  Acute kidney injury. Hx of CKD stage 3A. STarted HD 07/13/13  07/15/13 On break from CRRT 07/16/13: No CRRT per renal. Rx lasix  P:   -per rebal - might need HD/CRRT 07/17/13 to facilitate extubation  GASTROINTESTINAL A:   Post op ileus. Nutrition. Hx of GERD.  07/16/13: Vomit P:   - Hold Tube feeds x 24h - nutrition  -protonix for SUP -lft in am   HEMATOLOGIC A:   Acute blood loss anemia after AAA repair. Thrombocytopenia, MODS, dilution, consumption P:  -f/u CBC -Hewitt heparin  - PRBC for hgb , 7gm%  INFECTIOUS A:   Fever, new RLL airspace disease  -  07/12/13  .07/16/13: Afebrile x 72h on abx. Has Pseumdonas pna and VRE urine  P:   -cont zyvox and Zosyn   ENDOCRINE A:   DM w/ hyperglycemia  P:   -SSI -assess cortisol  NEUROLOGIC A:   Acute encephalopathy 2nd to shock, renal failure, respiratory failure.   07/16/13: Normal metnasl status x 2  P:   -prn sedation protocol >> goal RASS -1  D wua   GLOBAL 07/15/13 and 07/16/13: No family at bedside  D/w Dr Justin Mend   The patient is critically ill with multiple organ systems failure and requires high complexity decision making for assessment and support, frequent evaluation and titration of therapies, application of advanced monitoring technologies and extensive interpretation of multiple databases.   Critical Care Time devoted to patient care services described in this note is  35  Minutes.  Dr. Brand Males, M.D., [facility].C.P Pulmonary and Critical Care Medicine Staff Physician [place] [facility] Pager: [phone], If no answer or between  15:00h - 7:00h: call [phone]  07/16/2013 10:32 AM

## 2013-07-16 NOTE — Progress Notes (Signed)
[facility] Progress Note  07/16/2013 7:46 AM 6 Days Post-Op  Subjective:  Per RN, patient had an episode of projectile vomiting yesterday and tube feedings were discontinued. Had a BM yesterday. No melena or hematochezia.  Pt feels hot, but lower extremities are cooler today.   Tmax 99991111 BP systolic 0000000 Pulse 95  Filed Vitals:   07/16/13 0700  BP: 117/67  Pulse: 95  Temp:   Resp: 27    Physical Exam: Alert and responsive to questions with head nods Abdomen:  Positive BS, moderate distension, no tenderness to palpation Incisions:  Midline abdominal wound clean and dry, right inguinal wound is clean with no drainage Extremities: palpable L PT pulse, Doppler signals 2+ DP/PT, feet are cool to touch Neuro: sensation intact of lower extremities with good movement  CBC    Component Value Date/Time   WBC 9.8 07/16/2013 0342   RBC 2.69* 07/16/2013 0342   HGB 8.0* 07/16/2013 0342   HCT 23.8* 07/16/2013 0342   PLT 125* 07/16/2013 0342   MCV 88.5 07/16/2013 0342   MCH 29.7 07/16/2013 0342   MCHC 33.6 07/16/2013 0342   RDW 15.5 07/16/2013 0342   LYMPHSABS 0.6* 07/16/2013 0342   MONOABS 0.5 07/16/2013 0342   EOSABS 0.3 07/16/2013 0342   BASOSABS 0.0 07/16/2013 0342    BMET    Component Value Date/Time   NA 137 07/16/2013 0342   NA 138 07/16/2013 0342   K 3.8 07/16/2013 0342   K 3.8 07/16/2013 0342   CL 99 07/16/2013 0342   CL 100 07/16/2013 0342   CO2 22 07/16/2013 0342   CO2 22 07/16/2013 0342   GLUCOSE 118* 07/16/2013 0342   GLUCOSE 118* 07/16/2013 0342   BUN 76* 07/16/2013 0342   BUN 76* 07/16/2013 0342   CREATININE 5.10* 07/16/2013 0342   CREATININE 5.15* 07/16/2013 0342   CREATININE 4.17* 01/02/2013 1618   CALCIUM 8.3* 07/16/2013 0342   CALCIUM 8.3* 07/16/2013 0342   GFRNONAA 10* 07/16/2013 0342   GFRNONAA 10* 07/16/2013 0342   GFRAA 12* 07/16/2013 0342   GFRAA 12* 07/16/2013 0342    INR    Component Value Date/Time   INR 1.34 07/10/2013 1431     Intake/Output  Summary (Last 24 hours) at 07/16/13 0746 Last data filed at 07/16/13 0700  Gross per 24 hour  Intake 2282.72 ml  Output   1535 ml  Net 747.72 ml     Assessment/Plan:  73 y.o. male is s/p  resection and grafting of peri-renal abdominal aortic aneurysm with aortobiiliac graft  6 Days Post-Op  Tolerating ventilator well, will wean off per CCM  NG tube feeding discontinued yesterday due to projectile vomiting, re-continue per CCM Neo drip discontinued, blood pressure stable today Daily urine output improved, nephrology following DVT prophylaxis: Heparin   Will discuss plan of care with Dr. Hoy Finlay, PA-S [facility] [phone] 07/16/2013 7:46 AM

## 2013-07-16 NOTE — Progress Notes (Addendum)
   [facility]  Subjective  -Pt with vomiting overnight.  Currently tube feeds off   Objective 124/84 104 97.8 F (36.6 C) (Oral) 24 97%  Intake/Output Summary (Last 24 hours) at 07/16/13 1632 Last data filed at 07/16/13 1500  Gross per 24 hour  Intake 1334.25 ml  Output   1595 ml  Net -260.75 ml   Abdomen obese slightly distended Feet pink warm Incisions all healing Still on vent Marginal renal function no dialysis today  Assessment/Planning: POD # 6 AAA aorto right femoral left iliac with suprarenal clamp  1. ATN most likely due to underlying poor function and suprarenal clamp hopefully recovers: Nephrology following 2. VDRF multifactorial Critical care following 3. GI vomiting last pm off tube feeds will check abdominal film if persistant ileus will need TPN  Addendum: abdominal film shows slightly dilated colon paucity of gas otherwise.  Will rest gut a few days TPN for now. Daily suppository  Shanelle Clontz E 07/16/2013 4:32 PM --  Laboratory Lab Results:  Recent Labs  07/15/13 0500 07/16/13 0342  WBC 10.8* 9.8  HGB 8.8* 8.0*  HCT 25.4* 23.8*  PLT 92* 125*   BMET  Recent Labs  07/16/13 0342 07/16/13 1500  NA 137  138 136*  K 3.8  3.8 4.0  CL 99  100 98  CO2 22  22 23   GLUCOSE 118*  118* 108*  BUN 76*  76* 89*  CREATININE 5.10*  5.15* 5.92*  CALCIUM 8.3*  8.3* 8.4    COAG Lab Results  Component Value Date   INR 1.34 07/10/2013   INR 1.04 07/04/2013   INR 1.13 11/04/2012   No results found for this basename: PTT

## 2013-07-16 NOTE — Progress Notes (Addendum)
[facility] Progress Note Patient Name: [name] DOB: 1941-04-27 MRN: [id number]  Date of Service  07/16/2013   HPI/Events of Note   Pt with new rash, just started primaxin. Note allergic to cephalosporins with bullae and hives  eICU Interventions  D/c primaxin  Has pseudomonas in hcap>>start aztreonam Needs ID consult   Intervention Category Intermediate Interventions: Infection - evaluation and management  Asencion Noble 07/16/2013, 10:38 PM

## 2013-07-16 NOTE — Progress Notes (Signed)
[facility] ROUNDING NOTE   Subjective:   Interval History: more alert afebrile weight down but still oliguric   Objective:  Vital signs in last 24 hours:  Temp:  [97.8 F (36.6 C)-98.5 F (36.9 C)] 97.8 F (36.6 C) (01/14 0741) Pulse Rate:  [93-120] 95 (01/14 0813) Resp:  [18-39] 30 (01/14 0813) BP: (65-126)/(39-67) 126/56 mmHg (01/14 0813) SpO2:  [90 %-100 %] 100 % (01/14 0813) Arterial Line BP: (68-279)/(36-227) 136/61 mmHg (01/14 0800) FiO2 (%):  [40 %] 40 % (01/14 0813) Weight:  [107.5 kg (236 lb 15.9 oz)] 107.5 kg (236 lb 15.9 oz) (01/14 0500)  Weight change: -4.4 kg (-9 lb 11.2 oz) Filed Weights   07/14/13 0451 07/15/13 0500 07/16/13 0500  Weight: 112.2 kg (247 lb 5.7 oz) 111.9 kg (246 lb 11.1 oz) 107.5 kg (236 lb 15.9 oz)    Intake/Output: I/O last 3 completed shifts: In: 3753.2 [I.V.:781.2; NG/GT:1290; IV [id number] Out: 2875 [Urine:485; Emesis/NG output:1000; NN:[id number]; Stool:1]   Intake/Output this shift:  Total I/O In: 50 [I.V.:20; NG/GT:30] Out: 25 [Urine:25]  General: ETT tube in place  Cardiovascular: RRR. Mildly tachy intermittently  Respiratory: Coarse rhonchi bilaterally.  Abdomen: Soft. Distended. BS appreciated.  Extremities: No erythema or +1 edema.  Neuro: More alert. Awake. Trying to speak. Responds appropriately with head nods    Basic Metabolic Panel:  Recent Labs Lab 07/10/13 1431 07/11/13 0500  07/14/13 0400 07/14/13 0411 07/14/13 1500 07/15/13 0500 07/15/13 1500 07/16/13 0342  NA 136* 135*  < >  --  131* 135* 136* 138 137  138  K 5.4* 4.6  < >  --  4.7 4.3 4.0 3.9 3.8  3.8  CL 102 101  < >  --  95* 99 99 101 99  100  CO2 22 22  < >  --  21 23 23 23 22  22   GLUCOSE 285* 245*  < >  --  139* 114* 125* 113* 118*  118*  BUN 31* 41*  < >  --  64* 55* 54* 63* 76*  76*  CREATININE 1.71* 3.07*  < >  --  5.44* 4.31* 3.81* 4.20* 5.10*  5.15*  CALCIUM 7.2* 7.1*  < >  --  8.2* 8.2* 8.0* 7.8* 8.3*  8.3*  MG  1.3* 1.6  --   --  2.4  --  2.6*  --  2.7*  PHOS  --   --   < > 6.2*  --  4.8* 3.5 3.8 5.0*  < > = values in this interval not displayed.  Liver Function Tests:  Recent Labs Lab 07/11/13 0500 07/12/13 0338  07/14/13 0411 07/14/13 1500 07/15/13 0500 07/15/13 1500 07/16/13 0342  AST 64* 59*  --  23  --  30  --   --   ALT 25 29  --  23  --  24  --   --   ALKPHOS 71 67  --  84  --  132*  --   --   BILITOT 1.3* 1.0  --  1.0  --  1.0  --   --   PROT 5.1* 5.4*  --  5.7*  --  5.4*  --   --   ALBUMIN 2.7* 2.5*  < > 2.2* 2.0* 2.0* 2.1* 2.0*  < > = values in this interval not displayed.  Recent Labs Lab 07/11/13 0500  AMYLASE 245*   No results found for this basename: AMMONIA,  in the last 168 hours  CBC:  Recent Labs Lab 07/12/13 0338 07/13/13 0330 07/14/13 0411 07/15/13 0500 07/16/13 0342  WBC 13.2* 4.8 8.3 10.8* 9.8  NEUTROABS 11.8*  --   --  9.4* 8.5*  HGB 11.2* 10.0* 9.8* 8.8* 8.0*  HCT 33.9* 29.6* 28.2* 25.4* 23.8*  MCV 90.2 88.6 87.3 85.8 88.5  PLT 75* 61* 79* 92* 125*    Cardiac Enzymes: No results found for this basename: CKTOTAL, CKMB, CKMBINDEX, TROPONINI,  in the last 168 hours  BNP: No components found with this basename: POCBNP,   CBG:  Recent Labs Lab 07/15/13 0728 07/15/13 1135 07/15/13 1530 07/16/13 0330 07/16/13 0753  GLUCAP 102* 140* 97 116* 104*    Microbiology: Results for orders placed during the hospital encounter of 07/10/13  URINE CULTURE     Status: None   Collection Time    07/11/13  1:45 PM      Result Value Range Status   Specimen Description URINE, CATHETERIZED   Final   Special Requests NONE   Final   Culture  Setup Time     Final   Value: 07/11/2013 15:28     Performed at [facility]     Final   Value: 30,000 COLONIES/ML     Performed at [facility]   Culture     Final   Value: VANCOMYCIN RESISTANT ENTEROCOCCUS ISOLATED     Note: CRITICAL RESULT CALLED TO, READ BACK BY AND VERIFIED  WITH: SHANNON LOVE @ 10:53AM 07/15/13 BY DWEEKS     Performed at [facility]   Report Status 07/15/2013 FINAL   Final   Organism ID, Bacteria VANCOMYCIN RESISTANT ENTEROCOCCUS ISOLATED   Final  CULTURE, RESPIRATORY (NON-EXPECTORATED)     Status: None   Collection Time    07/12/13  5:00 PM      Result Value Range Status   Specimen Description TRACHEAL ASPIRATE   Final   Special Requests NONE   Final   Gram Stain     Final   Value: MODERATE WBC PRESENT, PREDOMINANTLY PMN     FEW SQUAMOUS EPITHELIAL CELLS PRESENT     FEW GRAM NEGATIVE RODS     RARE GRAM POSITIVE RODS     Performed at [facility]   Culture     Final   Value: ABUNDANT PSEUDOMONAS AERUGINOSA     Performed at [facility]   Report Status PENDING   Incomplete    Coagulation Studies: No results found for this basename: LABPROT, INR,  in the last 72 hours  Urinalysis: No results found for this basename: COLORURINE, APPERANCEUR, LABSPEC, [place], GLUCOSEU, HGBUR, BILIRUBINUR, KETONESUR, PROTEINUR, UROBILINOGEN, NITRITE, LEUKOCYTESUR,  in the last 72 hours    Imaging: Dg Chest Port 1 View  07/16/2013   CLINICAL DATA:  Check endotracheal tube.  EXAM: PORTABLE CHEST - 1 VIEW  COMPARISON:  DG CHEST 1V PORT dated 07/15/2013; DG CHEST 2 VIEW dated 07/04/2013; DG CHEST 1V PORT dated 07/10/2013; DG CHEST 1V PORT dated 07/12/2013; DG CHEST 1V PORT dated 07/13/2013; DG CHEST 1V PORT dated 07/13/2013  FINDINGS: Endotracheal tube is in satisfactory position. Nasogastric tube is followed into the stomach with the tip projecting beyond the inferior margin of the image. Left IJ central line tip projects over the SVC.  Heart is enlarged, stable. Thoracic aorta is calcified. Pulmonary hila appear prominent as well, unchanged. Diffuse bilateral mixed interstitial and airspace disease persists. No definite pleural fluid.  IMPRESSION: Persistent mixed interstitial and airspace disease, favored  represent edema.   Electronically  Signed   By: Lorin Picket M.D.   On: 07/16/2013 07:39   Dg Chest Port 1 View  07/15/2013   CLINICAL DATA:  Status post portable chest x-ray of July 13, 2012 abdominal aortic aneurysm repair and graft placement.  EXAM: PORTABLE CHEST - 1 VIEW  COMPARISON:  Portable chest x-ray of July 13, 2013  FINDINGS: The lungs are reasonably well inflated. The pulmonary interstitial markings remain diffusely increased but are not greatly changed. The left hemidiaphragm is slightly better demonstrated today. The cardiopericardial silhouette remains enlarged. The pulmonary vascularity is prominent centrally but better defined today. The endotracheal tube tip is difficult to demonstrate but appears to lie approximately 4.6 cm above the crotch of the carina. The esophagogastric tube tip and proximal port project off the film. The left internal jugular venous catheter tip appears stable in the region of the proximal SVC.  IMPRESSION: There has been slight interval improvement in the appearance of the pulmonary interstitium in the left hemidiaphragm is better demonstrated. There remains considerable increased interstitial density however throughout both lungs likely reflecting interstitial edema. The support tubes and lines are in adequate position radiographically.   Electronically Signed   By: David  Martinique   On: 07/15/2013 08:07     Medications:   . sodium chloride 20 mL/hr at 07/15/13 1600  . feeding supplement (VITAL HIGH PROTEIN) Stopped (07/15/13 1600)  . phenylephrine (NEO-SYNEPHRINE) Adult infusion Stopped (07/15/13 2100)  . dialysis replacement fluid (prismasate) 500 mL/hr at 07/14/13 2202  . dialysis replacement fluid (prismasate) 200 mL/hr at 07/13/13 1600  . dialysate (PRISMASATE) 1,500 mL/hr at 07/15/13 0756   . antiseptic oral rinse  15 mL Mouth Rinse QID  . arformoterol  15 mcg Nebulization BID  . aspirin  324 mg Per NG tube Daily  . budesonide (PULMICORT) nebulizer solution  0.5 mg  Nebulization BID  . chlorhexidine  15 mL Mouth Rinse BID  . Chlorhexidine Gluconate Cloth  6 each Topical Daily  . feeding supplement (PRO-STAT SUGAR FREE 64)  30 mL Per Tube Q1400  . furosemide  80 mg Intravenous Q6H  . heparin  1,000 Units Intravenous Once  . heparin subcutaneous  5,000 Units Subcutaneous Q8H  . imipenem-cilastatin  250 mg Intravenous Q12H  . insulin aspart  0-15 Units Subcutaneous Q4H  . linezolid  600 mg Intravenous Q12H  . pantoprazole (PROTONIX) IV  40 mg Intravenous QHS  . polyvinyl alcohol  2 drop Both Eyes BID   acetaminophen, albuterol, fentaNYL, heparin, hydrALAZINE, ipratropium, labetalol, metoprolol, midazolam, nitroGLYCERIN, ondansetron, sodium chloride  Assessment/ Plan:  1.) POD #6 s/p AAA repair, remains ventilated / sedated,off presors  2.) Acute On chronic renal failure s/p AAA repair - acute injury secondary to hypotension, cross clamping of renal arteries (ischemic insults); pt did also receive vancomycin postop. FENa 3 suggests ATN. Baseline Cr ~1.5 in the past;  CRT started 1/11.and stopped 1/13  UOP oliguria. Cr trending up at present. Electrolytes look good.  3.) CHF start lasix 80mg  IV q6h 4.) DM  5.) CAD  6. Enterococcus UTI: Awaiting speciation and sensitivities. Patient currently receiving impenem and zyvox. Afebrile overnight.   No indications for dialysis at this time will continue to observe    LOS: 6 [name] @TODAY @8 :44 AM

## 2013-07-17 DIAGNOSIS — Z2239 Carrier of other specified bacterial diseases: Secondary | ICD-10-CM

## 2013-07-17 DIAGNOSIS — J151 Pneumonia due to Pseudomonas: Secondary | ICD-10-CM

## 2013-07-17 DIAGNOSIS — L270 Generalized skin eruption due to drugs and medicaments taken internally: Secondary | ICD-10-CM

## 2013-07-17 DIAGNOSIS — T368X5A Adverse effect of other systemic antibiotics, initial encounter: Secondary | ICD-10-CM

## 2013-07-17 DIAGNOSIS — R82998 Other abnormal findings in urine: Secondary | ICD-10-CM

## 2013-07-17 LAB — RENAL FUNCTION PANEL
Albumin: 2.1 g/dL — ABNORMAL LOW (ref 3.5–5.2)
BUN: 102 mg/dL — ABNORMAL HIGH (ref 6–23)
CALCIUM: 8.4 mg/dL (ref 8.4–10.5)
CO2: 21 meq/L (ref 19–32)
Chloride: 99 mEq/L (ref 96–112)
Creatinine, Ser: 6.67 mg/dL — ABNORMAL HIGH (ref 0.50–1.35)
GFR calc Af Amer: 9 mL/min — ABNORMAL LOW (ref >90)
GFR, EST NON AFRICAN AMERICAN: 7 mL/min — AB (ref >90)
Glucose, Bld: 114 mg/dL — ABNORMAL HIGH (ref 70–99)
PHOSPHORUS: 6.5 mg/dL — AB (ref 2.3–4.6)
Potassium: 4 mEq/L (ref 3.7–5.3)
SODIUM: 139 meq/L (ref 137–147)

## 2013-07-17 LAB — GLUCOSE, CAPILLARY
GLUCOSE-CAPILLARY: 109 mg/dL — AB (ref 70–99)
GLUCOSE-CAPILLARY: 140 mg/dL — AB (ref 70–99)
GLUCOSE-CAPILLARY: 142 mg/dL — AB (ref 70–99)
GLUCOSE-CAPILLARY: 86 mg/dL (ref 70–99)
GLUCOSE-CAPILLARY: 95 mg/dL (ref 70–99)
Glucose-Capillary: 105 mg/dL — ABNORMAL HIGH (ref 70–99)
Glucose-Capillary: 111 mg/dL — ABNORMAL HIGH (ref 70–99)
Glucose-Capillary: 119 mg/dL — ABNORMAL HIGH (ref 70–99)
Glucose-Capillary: 120 mg/dL — ABNORMAL HIGH (ref 70–99)
Glucose-Capillary: 129 mg/dL — ABNORMAL HIGH (ref 70–99)
Glucose-Capillary: 92 mg/dL (ref 70–99)

## 2013-07-17 LAB — BASIC METABOLIC PANEL
BUN: 101 mg/dL — ABNORMAL HIGH (ref 6–23)
CO2: 20 mEq/L (ref 19–32)
CREATININE: 6.65 mg/dL — AB (ref 0.50–1.35)
Calcium: 8.5 mg/dL (ref 8.4–10.5)
Chloride: 99 mEq/L (ref 96–112)
GFR calc non Af Amer: 7 mL/min — ABNORMAL LOW (ref >90)
GFR, EST AFRICAN AMERICAN: 9 mL/min — AB (ref >90)
Glucose, Bld: 115 mg/dL — ABNORMAL HIGH (ref 70–99)
Potassium: 4 mEq/L (ref 3.7–5.3)
SODIUM: 139 meq/L (ref 137–147)

## 2013-07-17 LAB — CBC WITH DIFFERENTIAL/PLATELET
BASOS ABS: 0 10*3/uL (ref 0.0–0.1)
Basophils Relative: 0 % (ref 0–1)
Eosinophils Absolute: 0.4 10*3/uL (ref 0.0–0.7)
Eosinophils Relative: 4 % (ref 0–5)
HCT: 24.8 % — ABNORMAL LOW (ref 39.0–52.0)
Hemoglobin: 8.1 g/dL — ABNORMAL LOW (ref 13.0–17.0)
Lymphocytes Relative: 3 % — ABNORMAL LOW (ref 12–46)
Lymphs Abs: 0.3 10*3/uL — ABNORMAL LOW (ref 0.7–4.0)
MCH: 29.3 pg (ref 26.0–34.0)
MCHC: 32.7 g/dL (ref 30.0–36.0)
MCV: 89.9 fL (ref 78.0–100.0)
Monocytes Absolute: 0.8 10*3/uL (ref 0.1–1.0)
Monocytes Relative: 6 % (ref 3–12)
NEUTROS ABS: 10.7 10*3/uL — AB (ref 1.7–7.7)
NEUTROS PCT: 87 % — AB (ref 43–77)
Platelets: 160 10*3/uL (ref 150–400)
RBC: 2.76 MIL/uL — ABNORMAL LOW (ref 4.22–5.81)
RDW: 15.7 % — AB (ref 11.5–15.5)
WBC: 12.3 10*3/uL — AB (ref 4.0–10.5)

## 2013-07-17 LAB — MAGNESIUM: MAGNESIUM: 2.8 mg/dL — AB (ref 1.5–2.5)

## 2013-07-17 MED ORDER — TRACE MINERALS CR-CU-F-FE-I-MN-MO-SE-ZN IV SOLN
INTRAVENOUS | Status: DC
  Filled 2013-07-17: qty 2000

## 2013-07-17 MED ORDER — FENTANYL CITRATE 0.05 MG/ML IJ SOLN: 12.5000 ug | INTRAMUSCULAR | Status: DC | PRN

## 2013-07-17 MED ORDER — FAT EMULSION 20 % IV EMUL
120.0000 mL | INTRAVENOUS | Status: DC
  Filled 2013-07-17: qty 200

## 2013-07-17 MED ORDER — DIPHENHYDRAMINE HCL 50 MG/ML IJ SOLN: 25.0000 mg | Freq: Four times a day (QID) | INTRAMUSCULAR | Status: DC | PRN

## 2013-07-17 MED ORDER — SODIUM CHLORIDE 0.9 % IV SOLN
INTRAVENOUS | Status: DC
  Administered 2013-07-19: 10 mL/h via INTRAVENOUS

## 2013-07-17 NOTE — Progress Notes (Signed)
Dr Oneida Alar updated on plan to extubate. MD updated updated CCM stated to d/c NGT and start ice chips and hold TNA for now. MD ok to d/c NGT, but first crush ASA and give down NGT, but keep pt strict NPO for now, will hold on speech consult and TNA for now. Reather Laurence

## 2013-07-17 NOTE — Progress Notes (Signed)
Dr Chase Caller updated at bedside. Updated pt on cpap 5/5 since ~0800, tolerating well, follows commands, nods head appropriately. Updated output via NGT decreased. MD ok to extubate, no ABG. MD stated extubated to bipap x2 hour then wean as tolerates and bipap qhs. MD stated ok to pull NGT and start ice chips and will consult speech therapy, MD updated vascular surgery note stated to start TNA today and keep NPO, will updated surgeon on plan to extubate and see if MD ok to pull NGT/start diet. Updated off pressors, sbp >110, SR. Will continue to monitor. [name]

## 2013-07-17 NOTE — Progress Notes (Signed)
I have seen and examined this patient and agree with the plan of care . Continue to monitor renal function hopefully should recover [facility] W 07/17/2013, 11:30 AM

## 2013-07-17 NOTE — Progress Notes (Addendum)
NUTRITION CONSULT/FOLLOW UP  INTERVENTION:  Await swallow evaluation/SLP recommendations  ? TPN vs EN re-initiation RD to follow for nutrition care plan  NUTRITION DIAGNOSIS: Inadequate oral intake related to inability to eat, s/p extubation as evidenced by NPO status, ongoing  Goal: PO diet vs nutrition support to meet >/= 90% of estimated nutrition needs, currently unmet  Monitor:  PO diet advancement vs nutrition support re-initiation, weight, labs, I/O's  ASSESSMENT: Patient with PMH of OSA, CAD, prior CABG and AVR; presented for surgery with diagnosis of AAA.  Patient s/p procedure 1/8: RESECTION AND GRAFTING OF PERIRENAL AAA  Patient extubated this AM.  Vital AF 1.2 formula on hold.  Patient with episode of vomiting large amount of tube feeding 1/13.  CT abdomen impression 1/14: mild colonic ileus.  CRRT stopped 1/13.  CWOCN note 1/15 reviewed -- patient with suspected deep tissue injury on R and L buttock.  Bedside swallow evaluation pending.  Patient is currently receiving Zyvox, a medication with potential interactions with high tyramine-containing foods.  RD will readdress if pt continues on this medication and is advanced to a PO diet.   Height: Ht Readings from Last 1 Encounters:  07/10/13 5' 5.5" (1.664 m)    Weight: Wt Readings from Last 1 Encounters:  07/17/13 238 lb 8.6 oz (108.2 kg)    Re-estimated needs: Kcal: 2345 Protein: 130-140 gm Fluid: per MD  Skin: abdominal surgical incision  Diet Order: NPO   Intake/Output Summary (Last 24 hours) at 07/17/13 1242 Last data filed at 07/17/13 1009  Gross per 24 hour  Intake   1350 ml  Output    870 ml  Net    480 ml    Labs:   Recent Labs Lab 07/15/13 0500  07/16/13 0342 07/16/13 1500 07/17/13 0430  NA 136*  < > 137  138 136* 139  139  K 4.0  < > 3.8  3.8 4.0 4.0  4.0  CL 99  < > 99  100 98 99  99  CO2 23  < > 22  22 23 21  20   BUN 54*  < > 76*  76* 89* 102*  101*   CREATININE 3.81*  < > 5.10*  5.15* 5.92* 6.67*  6.65*  CALCIUM 8.0*  < > 8.3*  8.3* 8.4 8.4  8.5  MG 2.6*  --  2.7*  --  2.8*  PHOS 3.5  < > 5.0* 5.9* 6.5*  GLUCOSE 125*  < > 118*  118* 108* 114*  115*  < > = values in this interval not displayed.  CBG (last 3)   Recent Labs  07/17/13 0427 07/17/13 0758 07/17/13 1135  GLUCAP 111* 95 129*    Scheduled Meds: . antiseptic oral rinse  15 mL Mouth Rinse QID  . arformoterol  15 mcg Nebulization BID  . aspirin  324 mg Per NG tube Daily  . aztreonam  1 g Intravenous Q8H  . bisacodyl  10 mg Rectal Daily  . budesonide (PULMICORT) nebulizer solution  0.5 mg Nebulization BID  . chlorhexidine  15 mL Mouth Rinse BID  . feeding supplement (PRO-STAT SUGAR FREE 64)  30 mL Per Tube Q1400  . furosemide  80 mg Intravenous Q6H  . heparin subcutaneous  5,000 Units Subcutaneous Q8H  . insulin aspart  0-15 Units Subcutaneous Q4H  . linezolid  600 mg Intravenous Q12H  . pantoprazole (PROTONIX) IV  40 mg Intravenous QHS  . polyvinyl alcohol  2 drop Both Eyes BID  Continuous Infusions: . feeding supplement (VITAL HIGH PROTEIN) Stopped (07/15/13 1600)    Past Medical History  Diagnosis Date  . Hypertension   . Hyperlipidemia   . Prediabetes   . CHF (congestive heart failure) 07/2012; 10/17/2012  . COPD (chronic obstructive pulmonary disease)     a.  History of heavy smoking. PFTs (4/14) with mixed obstructive (COPD) and restrictive (post-ARDS) picture.   . Pneumonia 07/2012    PNA (H1N1 influenza + pneumococcus) in [id number] complicated by respiratory failure/ARDS. Required tracheostomy, now weaned off. Had left empyema requiring chest tube.   . Diabetes mellitus without complication   . History of blood transfusion     "lots since January" (10/17/2012)  . Ischemic cardiomyopathy   . Anemia of chronic disease   . Aortic stenosis     Moderate to severe by echo in 4/14. Bioprosthetic #21 [facility] Ease aortic valve replacement in 4/14.    Marland Kitchen CAD (coronary artery disease)     a. 4/14 CABG: LIMA-LAD, seq SVG-ramus & OM1, SVG-PDA  . Carotid arterial disease     Carotid dopplers (0000000) with [id number] LICA stenosis.   Marland Kitchen AAA (abdominal aortic aneurysm)     5/14 CT showed > 6 cm AAA, also right iliac aneurysm. Not stent graft candidate.   . Esophageal reflux   . Ischemic cardiomyopathy     a. 4/14 LHC: pLAD 95, ost ramus 70-80, mLCx 90, EF 30%  . Chronic systolic heart failure     a. 1/14 ECHO: sev dil LV, EF30-35%, diff HK, mild MR, AS severe, AV grad 35 b. 8/14 ECHO: EF 55-60%, mild biopros AV sten mn grad. 25, RV mild dil, RA mild dil  . Complication of anesthesia     during last surgery had to be given special medicine b/c "something dropped" during surgery   . Anxiety     pt. admits that he has anxiety at times   . Shortness of breath     still goes deer hunting by himself  . OSA (obstructive sleep apnea)     on cpap- every sleep time.   . Chronic kidney disease     increased creatinine recently - being followed, near kidney failure fr. contrast dye   . Prostate cancer      s/p radiation treatment. - (PT. DENIES)Has indwelling foley.   . Arthritis     Past Surgical History  Procedure Laterality Date  . Transurethral microwave therapy  10/15/2012  . Nasal fracture surgery  1970's  . Tracheostomy  07/2012  . Tracheostomy closure  08/2012  . Anal fissure repair  2008  . Coronary artery bypass graft N/A 10/25/2012    Procedure: CORONARY ARTERY BYPASS GRAFTING (CABG);  Surgeon: Melrose Nakayama, MD;  Location: [facility];  Service: Open Heart Surgery;  Laterality: N/A;  times 4 using left internal mammary artery and endoscopically harvested bilateral saphenous vein   . Aortic valve replacement N/A 10/25/2012    Procedure: AORTIC VALVE REPLACEMENT (AVR);  Surgeon: Melrose Nakayama, MD;  Location: [facility];  Service: Open Heart Surgery;  Laterality: N/A;  . Intraoperative transesophageal echocardiogram N/A 10/25/2012     Procedure: INTRAOPERATIVE TRANSESOPHAGEAL ECHOCARDIOGRAM;  Surgeon: Melrose Nakayama, MD;  Location: [facility];  Service: Open Heart Surgery;  Laterality: N/A;  . Cardiac valve replacement    . Abdominal aortic aneurysm repair N/A 07/10/2013    Procedure: Resection and Graftiong of perirenal AAA; Insertion 14 x 8 Hemashield Graft Aorta to Left Common Iliac and to  Right Common Femoral Artery With Ligastion of Right External and Interanl Iliac Artery;  Surgeon: Mal Misty, MD;  Location: [place];  Service: Vascular;  Laterality: N/A;    Arthur Holms, RD, LDN Pager #: [phone] After-Hours Pager #: [phone]

## 2013-07-17 NOTE — Consult Note (Signed)
WOC wound consult note Reason for Consult: evaluation of possible sDTI.  Pt s/p AAA repair, on the vent, pressors.  On Sport bed for offloading and sacral prophylactic dressing in place.  Noted per staff to have thin, linear deep tissue injury on the right buttock and left buttock Wound type: sDTI (supected deep tissue injury) device related, could be sheet wrinkle or line Measurement: 0.1cm x 4.0cm x 0 area dark, maroon, linear, skin intact Wound bed: not open Drainage (amount, consistency, odor) none Periwound:intact, pt has had abtx skin rash that is present over the entire body.  Skin assessed under sacrum, intact with no issues. Dressing procedure/placement/frequency: continue sacral prophylactic dressing as long as patient meets criteria.  Monitor this new area, it is very superficial and I think with close monitoring and turning will resolve. Explained this to patient and bedside nursing staff.    WOC will remain available, re-consult if needed or if this area changes. Leanna Hamid Liane Comber RN,CWOCN [id number]

## 2013-07-17 NOTE — Progress Notes (Signed)
Name: [name] MRN: [id number] DOB: [birth year]    ADMISSION DATE:  07/10/2013 CONSULTATION DATE:  1/8 LOS 7 days   REFERRING MD :  Kellie Simmering   CHIEF COMPLAINT:  Post-op   BRIEF PATIENT DESCRIPTION:  73 year old male w/ multiple medical co-morbids: OSA, CAD, prior CABG and AVR w/ bioprosthetic valve,  s/p resection and grafting of perirenal AAA 1/8. Intra op course notable for estimated 50 minutes clamp time above the renals. Returned to the ICU post-op on the vent. PCCM asked to assist w/ post-op care.   SIGNIFICANT EVENTS / STUDIES:   1/8: s/p resection and grafting of perirenal AAA 1/8 07/11/13 ECHO ef 55% 1/11: change to pressure control 1/11 renal replacement therapy 07/15/13:Still on some neo. CRRT off; reassess tomorrow. Getting tachypneic with SBT. Normal mental status 07/16/13: 5/5 RR 40s and then copiouys think ET tube secretions. CXR looks very wet   LINES / TUBES: OETT 1/8>>> Left IJ PCA 1/8>>> Left rad-aline 1/8>>> Right HD 1/11>>>  CULTURES: Urine 1/9 >>>> 30K VRE resp culture 1/10>>> ABUNDDANT PSEUMONOAS     ANTIBIOTICS: vanc 1/8>>>1/9 Zosyn 1/11>>> ? Given at all Imipenem 07/13/13 >>07/16/13 (rash), aztreonam 07/16/13>> zyvox 1/11>>>    SUBJECTIVE/OVERNIGHT/INTERVAL HX  07/17/13: weaning on 5/5. Rash after imipenem yesterday; changed to aztreonam. Had vomitus 48h ago: NG output 300cc x 24h         VITAL SIGNS: Temp:  [97.1 F (36.2 C)-98.6 F (37 C)] 97.6 F (36.4 C) (01/15 0805) Pulse Rate:  [83-104] 89 (01/15 0900) Resp:  [20-34] 26 (01/15 0900) BP: (97-139)/(53-84) 121/59 mmHg (01/15 0900) SpO2:  [85 %-100 %] 96 % (01/15 0900) Arterial Line BP: (109-145)/(52-70) 131/60 mmHg (01/15 0900) FiO2 (%):  [40 %] 40 % (01/15 0800) Weight:  [108.2 kg (238 lb 8.6 oz)] 108.2 kg (238 lb 8.6 oz) (01/15 0500) HEMODYNAMICS: CVP:  [10 mmHg-14 mmHg] 11 mmHg VENTILATOR SETTINGS: Vent Mode:  [-] CPAP;PSV FiO2 (%):  [40 %] 40 % Set Rate:  [22 bmp] 22  bmp PEEP:  [5 cmH20] 5 cmH20 Pressure Support:  [5 cmH20-15 cmH20] 5 cmH20 Plateau Pressure:  [19 cmH20-23 cmH20] 20 cmH20 INTAKE / OUTPUT: Intake/Output     01/14 0701 - 01/15 0700 01/15 0701 - 01/16 0700   I.V. (mL/kg) 480 (4.4) 20 (0.2)   NG/GT 150 30   IV Piggyback 800    Total Intake(mL/kg) 1430 (13.2) 50 (0.5)   Urine (mL/kg/hr) 740 (0.3) 45 (0.2)   Emesis/NG output 300 (0.1)    Other     Stool     Total Output 1040 45   Net +390 +5        Stool Occurrence 1 x      PHYSICAL EXAMINATION: General: ill appearing Neuro: RASS o0. CAM-ICU neg for delirium,. Moves all 4s - prn sedations HEENT: ETT in place Cardiovascular:  s1 s2 rrr Lungs: coarse Abdomen: soft, wound site clean, distended Musculoskeletal: 2+ edema Skin: dopplerable pulses  LABS: PULMONARY  Recent Labs Lab 07/10/13 1647 07/11/13 0401 07/13/13 0936 07/14/13 1100 07/15/13 0433  PHART 7.280* 7.327* 7.339* 7.364 7.381  PCO2ART 51.5* 42.4 31.8* 37.5 40.0  PO2ART 99.0 65.0* 76.0* 74.0* 70.0*  HCO3 24.2* 21.8 16.9* 21.4 23.7  TCO2 26 23 18 23 25   O2SAT 97.0 89.0 93.0 94.0 93.0    CBC  Recent Labs Lab 07/15/13 0500 07/16/13 0342 07/17/13 0430  HGB 8.8* 8.0* 8.1*  HCT 25.4* 23.8* 24.8*  WBC 10.8* 9.8 12.3*  PLT 92* 125* 160  COAGULATION  Recent Labs Lab 07/10/13 1431  INR 1.34    CARDIAC  No results found for this basename: TROPONINI,  in the last 168 hours No results found for this basename: PROBNP,  in the last 168 hours   CHEMISTRY  Recent Labs Lab 07/11/13 0500  07/14/13 0411  07/15/13 0500 07/15/13 1500 07/16/13 0342 07/16/13 1500 07/17/13 0430  NA 135*  < > 131*  < > 136* 138 137  138 136* 139  139  K 4.6  < > 4.7  < > 4.0 3.9 3.8  3.8 4.0 4.0  4.0  CL 101  < > 95*  < > 99 101 99  100 98 99  99  CO2 22  < > 21  < > 23 23 22  22 23 21  20   GLUCOSE 245*  < > 139*  < > 125* 113* 118*  118* 108* 114*  115*  BUN 41*  < > 64*  < > 54* 63* 76*  76* 89* 102*   101*  CREATININE 3.07*  < > 5.44*  < > 3.81* 4.20* 5.10*  5.15* 5.92* 6.67*  6.65*  CALCIUM 7.1*  < > 8.2*  < > 8.0* 7.8* 8.3*  8.3* 8.4 8.4  8.5  MG 1.6  --  2.4  --  2.6*  --  2.7*  --  2.8*  PHOS  --   < >  --   < > 3.5 3.8 5.0* 5.9* 6.5*  < > = values in this interval not displayed. Estimated Creatinine Clearance: 11.6 ml/min (by C-G formula based on Cr of 6.67).   LIVER  Recent Labs Lab 07/10/13 1431  07/11/13 0500 07/12/13 0338  07/14/13 0411  07/15/13 0500 07/15/13 1500 07/16/13 0342 07/16/13 1500 07/17/13 0430  AST  --   --  64* 59*  --  23  --  30  --   --   --   --   ALT  --   --  25 29  --  23  --  24  --   --   --   --   ALKPHOS  --   --  71 67  --  84  --  132*  --   --   --   --   BILITOT  --   --  1.3* 1.0  --  1.0  --  1.0  --   --   --   --   PROT  --   --  5.1* 5.4*  --  5.7*  --  5.4*  --   --   --   --   ALBUMIN  --   < > 2.7* 2.5*  < > 2.2*  < > 2.0* 2.1* 2.0* 2.1* 2.1*  INR 1.34  --   --   --   --   --   --   --   --   --   --   --   < > = values in this interval not displayed.   INFECTIOUS  Recent Labs Lab 07/14/13 1057 07/15/13 0500 07/16/13 0342  PROCALCITON 23.96 22.72 19.21     ENDOCRINE CBG (last 3)   Recent Labs  07/16/13 2022 07/17/13 0024 07/17/13 0427  GLUCAP 109* 140* 111*         IMAGING x48h  Dg Chest Port 1 View  07/16/2013   CLINICAL DATA:  Check endotracheal tube.  EXAM: PORTABLE CHEST - 1 VIEW  COMPARISON:  DG CHEST 1V PORT dated 07/15/2013; DG CHEST 2 VIEW dated 07/04/2013; DG CHEST 1V PORT dated 07/10/2013; DG CHEST 1V PORT dated 07/12/2013; DG CHEST 1V PORT dated 07/13/2013; DG CHEST 1V PORT dated 07/13/2013  FINDINGS: Endotracheal tube is in satisfactory position. Nasogastric tube is followed into the stomach with the tip projecting beyond the inferior margin of the image. Left IJ central line tip projects over the SVC.  Heart is enlarged, stable. Thoracic aorta is calcified. Pulmonary hila appear prominent as well,  unchanged. Diffuse bilateral mixed interstitial and airspace disease persists. No definite pleural fluid.  IMPRESSION: Persistent mixed interstitial and airspace disease, favored represent edema.   Electronically Signed   By: Lorin Picket M.D.   On: 07/16/2013 07:39   Dg Abd Portable 1v  07/16/2013   CLINICAL DATA:  Post abdominal aortic aneurysm repair.  EXAM: PORTABLE ABDOMEN - 1 VIEW  COMPARISON:  July 10, 2013  FINDINGS: Nasogastric tube tip and side port are in the stomach. There is mild dilatation in the proximal transverse colon, probably representing mild colonic ileus. There is no evidence suggesting small bowel obstruction. No free air. There is a suprapubic drain.  IMPRESSION: Suspect mild colonic ileus. No bowel obstruction or free air seen on this supine examination. Nasogastric tube tip and side port in stomach.   Electronically Signed   By: Lowella Grip M.D.   On: 07/16/2013 17:26     ASSESSMENT / PLAN:  PULMONARY A:  Acute respiratory failure 2nd to shock, metabolic derangements, atelectasis. Hx of OSA, COPD. Right > left airspace disease. Ddx: HCAP, evolving TRALI, volume overload (favor volume)  1./15/15: Meets extubation criteria  P:   - Extubate - Bipap qhs + daytime prn  CARDIOVASCULAR A:  S/p resection and grafting of perirenal AAA  1/8 . Hx of CAD and severe AS s/p CABG and AVR w/ Bioprosthetic valve 4/14, last EF 55-60% 8/14. Hx of HTN.  07/17/13: Off pressors x 48h    P:  -MAP goal 65  RENAL A:  Acute kidney injury. Hx of CKD stage 3A. STarted HD 07/13/13  07/15/13 On break from CRRT 07/16/13: No CRRT per renal. Rx lasix  P:   -per renal   GASTROINTESTINAL A:   Post op ileus. Nutrition. Hx of GERD.  07/16/13: Vomit 07/17/13: total NG putput only 300cc x 24h which is minimal  P:   - Hold Tube feeds to continue and consider extubation - nutrition  -protonix for SUP -advance diet as tolerated  HEMATOLOGIC A:   Acute blood loss  anemia after AAA repair. Thrombocytopenia, MODS, dilution, consumption P:  -f/u CBC -[place] heparin  - PRBC for hgb , 7gm%  INFECTIOUS A:   Fever, new RLL airspace disease  - 07/12/13  .07/16/13: Afebrile x 4days on abx. Has Pseumdonas pna and VRE urine but developed rash and abx chnated  P:   -cont zyvox and aztreonam - call ID consult   ENDOCRINE A:   DM w/ hyperglycemia  P:   -SSI -assess cortisol  NEUROLOGIC A:   Acute encephalopathy 2nd to shock, renal failure, respiratory failure.   07/16/13: Normal metnasl status x 2  P:   -prn sedation protocol >> goal RASS -1  D wua   SKIN A: Diffuse red Rash 07/16/13 after imipenem. Also new sacaral redness P Per [place]  GLOBAL 07/15/13 and 07/16/13 and 07/17/13: No family at bedside    The patient is critically ill with multiple organ systems failure and requires high  complexity decision making for assessment and support, frequent evaluation and titration of therapies, application of advanced monitoring technologies and extensive interpretation of multiple databases.   Critical Care Time devoted to patient care services described in this note is  35  Minutes.  Dr. Brand Males, M.D., [facility]C.P Pulmonary and Critical Care Medicine Staff Physician [facility] Pager: [phone], If no answer or between  15:00h - 7:00h: call [phone]  07/17/2013 9:27 AM

## 2013-07-17 NOTE — Evaluation (Signed)
Physical Therapy Evaluation Patient Details Name: [name] MRN: [id number] DOB: 1941/06/19 Today's Date: 07/17/2013 Time: [id number] PT Time Calculation (min): 31 min  PT Assessment / Plan / Recommendation History of Present Illness  ANEURYSM ABDOMINAL AORTIC REPAIR & RIGHT COMMON ILIAC ARTERY ANEURYSM REPAIR (N/A) as a surgical intervention   Clinical Impression  Pt admitted for above surgery and returned on vent.  Pt currently with functional limitations due to the deficits listed below (see PT Problem List).  Pt will benefit from skilled PT to increase their independence and safety with mobility to allow discharge to the venue listed below.       PT Assessment  Patient needs continued PT services    Follow Up Recommendations  SNF    Does the patient have the potential to tolerate intense rehabilitation      Barriers to Discharge        Equipment Recommendations  Other (comment) (TBA)    Recommendations for Other Services     Frequency Min 3X/week    Precautions / Restrictions Precautions Precautions: Fall Restrictions Weight Bearing Restrictions: No   Pertinent Vitals/Pain On 6L [place] post recent extubation, pt maintained sats >= 88%.   EHR in 110's      Mobility  Bed Mobility Overal bed mobility: Needs Assistance Bed Mobility: Supine to Sit;Sit to Supine Supine to sit: Mod assist Sit to supine: Mod assist General bed mobility comments: truncal assist needed Transfers Overall transfer level: Needs assistance Equipment used: Rolling walker (2 wheeled) Transfers: Sit to/from Stand Sit to Stand: Min assist;Mod assist (dependent on height of surface) General transfer comment: cues for transfer safety, assist to come forward and up Ambulation/Gait Ambulation/Gait assistance: Mod assist Ambulation Distance (Feet): 30 Feet Assistive device:  (pushing w/c) Gait Pattern/deviations: Step-through pattern;Decreased step length - right;Decreased step length -  left;Decreased stride length;Wide base of support    Exercises     PT Diagnosis: Difficulty walking;Generalized weakness  PT Problem List: Decreased strength;Decreased activity tolerance;Decreased balance;Decreased mobility;Decreased coordination;Decreased safety awareness;Decreased knowledge of precautions;Decreased knowledge of use of DME PT Treatment Interventions: DME instruction;Gait training;Functional mobility training;Therapeutic activities;Balance training;Patient/family education     PT Goals(Current goals can be found in the care plan section) Acute Rehab PT Goals Patient Stated Goal: back home and able to do for myself PT Goal Formulation: With patient Time For Goal Achievement: 07/31/13 Potential to Achieve Goals: Good  Visit Information  Last PT Received On: 07/17/13 Assistance Needed: +2 (lines tubes, safety) History of Present Illness: ANEURYSM ABDOMINAL AORTIC REPAIR & RIGHT COMMON ILIAC ARTERY ANEURYSM REPAIR (N/A) as a surgical intervention        Prior Functioning  Home Living Family/patient expects to be discharged to:: Private residence Living Arrangements: Spouse/significant other;Other (Comment) (at his daughter's home) Available Help at Discharge: Family;Available 24 hours/day Type of Home: House Home Access: Stairs to enter [employer] of Steps: 2 Entrance Stairs-Rails: None Home Layout: One level Home Equipment: Walker - 2 wheels;Bedside commode Prior Function Level of Independence: Independent Dominant Hand: Right    Cognition  Cognition Arousal/Alertness: Awake/alert Behavior During Therapy: WFL for tasks assessed/performed Overall Cognitive Status: Within Functional Limits for tasks assessed    Extremity/Trunk Assessment Upper Extremity Assessment Upper Extremity Assessment: Generalized weakness Lower Extremity Assessment Lower Extremity Assessment: Generalized weakness Cervical / Trunk Assessment Cervical / Trunk Assessment:  Normal   Balance Balance Overall balance assessment: Needs assistance Sitting-balance support: No upper extremity supported;Single extremity supported;Feet supported Sitting balance-Leahy Scale: Fair Standing balance support: Bilateral upper  extremity supported Standing balance-Leahy Scale: Fair  End of Session PT - End of Session Equipment Utilized During Treatment: Oxygen Activity Tolerance: Patient limited by fatigue;Patient tolerated treatment well Patient left: in bed;with call bell/phone within reach;with family/visitor present Nurse Communication: Mobility status  GP     Deepika [place], [name] 07/17/2013, 5:58 PM 07/17/2013  Donnella Sham, [place] [phone]  (pager)

## 2013-07-17 NOTE — Consult Note (Addendum)
[facility]  Total days of antibiotics 7        Day 5 linezolid        Day 2 aztreo        (4 days imi, 2 days of vanco)       Reason for Consult: antibiotic rash    Referring Physician: ramaswamy  Active Problems:   AAA (abdominal aortic aneurysm)   Pseudomonas pneumonia   VRE (vancomycin resistant enterococcus) culture positive    HPI: [name] is a 73 y.o. male  w/ MMP including OSA, CAD, prior CABG and AVR w/ bioprosthetic valve, who was admitted on 1/8 for elective surgery for aaa repair and right common iliac aneurysm repaint he is POD #7 s/p resection and grafting of perirenal AAA . Intra op course notable for estimated 50 minutes clamp time above the renals thus leading to renal ischemia having AKI on CKD (bl cr of 1.5) requiring dialysis. Also required 4 u RBC transfusion. He had post op fever thru 1/11, and also having respiratory distress requiring ventilation, extubated today now on now on nasal cannula.  Due to fevers, he was started on imipenem (due to hives with cephalosporins) and linezolid. His infectious work up revealed pan sensitive PsA on trach aspirate culture And 30K amp S VRE in urine cx, ua showed pyuria/bacturia. While on linezolid and imipenem,He was noted to break out in diffuse rash on 1/14, thus changed from imipenem to aztreonam. He reports tolerating fluoroquinolones in the past without difficulty.   Past Medical History  Diagnosis Date  . Hypertension   . Hyperlipidemia   . Prediabetes   . CHF (congestive heart failure) 07/2012; 10/17/2012  . COPD (chronic obstructive pulmonary disease)     a.  History of heavy smoking. PFTs (4/14) with mixed obstructive (COPD) and restrictive (post-ARDS) picture.   . Pneumonia 07/2012    PNA (H1N1 influenza + pneumococcus) in 0/05 complicated by respiratory failure/ARDS. Required tracheostomy, now weaned off. Had left empyema requiring chest tube.   . Diabetes mellitus without  complication   . History of blood transfusion     "lots since January" (10/17/2012)  . Ischemic cardiomyopathy   . Anemia of chronic disease   . Aortic stenosis     Moderate to severe by echo in 4/14. Bioprosthetic #21 [facility] Ease aortic valve replacement in 4/14.   Marland Kitchen CAD (coronary artery disease)     a. 4/14 CABG: LIMA-LAD, seq SVG-ramus & OM1, SVG-PDA  . Carotid arterial disease     Carotid dopplers (1/10) with 21-11% LICA stenosis.   Marland Kitchen AAA (abdominal aortic aneurysm)     5/14 CT showed > 6 cm AAA, also right iliac aneurysm. Not stent graft candidate.   . Esophageal reflux   . Ischemic cardiomyopathy     a. 4/14 LHC: pLAD 95, ost ramus 70-80, mLCx 90, EF 30%  . Chronic systolic heart failure     a. 1/14 ECHO: sev dil LV, EF30-35%, diff HK, mild MR, AS severe, AV grad 35 b. 8/14 ECHO: EF 55-60%, mild biopros AV sten mn grad. 25, RV mild dil, RA mild dil  . Complication of anesthesia     during last surgery had to be given special medicine b/c "something dropped" during surgery   . Anxiety     pt. admits that he has anxiety at times   . Shortness of breath     still goes deer hunting by himself  . OSA (obstructive  sleep apnea)     on cpap- every sleep time.   . Chronic kidney disease     increased creatinine recently - being followed, near kidney failure fr. contrast dye   . Prostate cancer      s/p radiation treatment. - (PT. DENIES)Has indwelling foley.   . Arthritis     Allergies:  Allergies  Allergen Reactions  . Omnipaque [Iohexol] Other (See Comments)    Decreased kidney function  . Primaxin [Imipenem] Hives  . Cephalosporins Rash    Blisters    MEDICATIONS: . antiseptic oral rinse  15 mL Mouth Rinse QID  . arformoterol  15 mcg Nebulization BID  . aspirin  324 mg Per NG tube Daily  . aztreonam  1 g Intravenous Q8H  . bisacodyl  10 mg Rectal Daily  . budesonide (PULMICORT) nebulizer solution  0.5 mg Nebulization BID  . chlorhexidine  15 mL Mouth Rinse BID    . feeding supplement (PRO-STAT SUGAR FREE 64)  30 mL Per Tube Q1400  . furosemide  80 mg Intravenous Q6H  . heparin subcutaneous  5,000 Units Subcutaneous Q8H  . insulin aspart  0-15 Units Subcutaneous Q4H  . linezolid  600 mg Intravenous Q12H  . pantoprazole (PROTONIX) IV  40 mg Intravenous QHS  . polyvinyl alcohol  2 drop Both Eyes BID    History  Substance Use Topics  . Smoking status: Former Smoker -- 2.00 packs/day for 58 years    Types: Cigarettes    Quit date: 07/20/2012  . Smokeless tobacco: Never Used  . Alcohol Use: No     Comment: 10/17/2012 "years since I've had a drink; never had problem with it"    Family History  Problem Relation Age of Onset  . Congestive Heart Failure    . Heart disease      Review of Systems  Constitutional: Negative for fever, chills, diaphoresis, activity change, appetite change, fatigue and unexpected weight change.  HENT: Negative for congestion, sore throat, rhinorrhea, sneezing, trouble swallowing and sinus pressure.  Eyes: Negative for photophobia and visual disturbance.  Respiratory: Negative for cough, chest tightness, shortness of breath, wheezing and stridor.  Cardiovascular: Negative for chest pain, palpitations and leg swelling.  Gastrointestinal: Negative for nausea, vomiting, abdominal pain, diarrhea, constipation, blood in stool, abdominal distention and anal bleeding.  Genitourinary: Negative for dysuria, hematuria, flank pain and difficulty urinating.  Musculoskeletal: Negative for myalgias, back pain, joint swelling, arthralgias and gait problem.  Skin: positive for rash Neurological: Negative for dizziness, tremors, weakness and light-headedness.  Hematological: Negative for adenopathy. Does not bruise/bleed easily.  Psychiatric/Behavioral: Negative for behavioral problems, confusion, sleep disturbance, dysphoric mood, decreased concentration and agitation.     LINES / TUBES:  OETT 1/8>>>  Left IJ PCA 1/8>>>  Left  rad-aline 1/8>>>  Right HD 1/11>>>  OBJECTIVE: Temp:  [97 F (36.1 C)-98.6 F (37 C)] 97 F (36.1 C) (01/15 1138) Pulse Rate:  [76-104] 82 (01/15 1512) Resp:  [20-31] 21 (01/15 1512) BP: (97-139)/(54-84) 104/54 mmHg (01/15 1500) SpO2:  [85 %-100 %] 100 % (01/15 1512) Arterial Line BP: (118-145)/(55-70) 128/60 mmHg (01/15 1400) FiO2 (%):  [40 %-50 %] 50 % (01/15 1400) Weight:  [238 lb 8.6 oz (108.2 kg)] 238 lb 8.6 oz (108.2 kg) (01/15 0500) General: older than stated age, chronically ill appearing Neuro:  Moves all 4s spontaneously HEENT: dry oral mucosa Neck: left IJ Cardiovascular: s1 s2, rrr, no g/m/r Lungs: coarse lung sounds throughout anterior lung fields Abdomen: soft, wound site  clean, distended  Musculoskeletal: 2+ edema  Skin: scattered erythamatous macular patches to chest,abdomen, legs; scattered echymosis    LABS: Results for orders placed during the hospital encounter of 07/10/13 (from the past 48 hour(s))  GLUCOSE, CAPILLARY     Status: Abnormal   Collection Time    07/16/13  3:30 AM      Result Value Range   Glucose-Capillary 116 (*) 70 - 99 mg/dL  MAGNESIUM     Status: Abnormal   Collection Time    07/16/13  3:42 AM      Result Value Range   Magnesium 2.7 (*) 1.5 - 2.5 mg/dL  RENAL FUNCTION PANEL     Status: Abnormal   Collection Time    07/16/13  3:42 AM      Result Value Range   Sodium 137  137 - 147 mEq/L   Potassium 3.8  3.7 - 5.3 mEq/L   Chloride 99  96 - 112 mEq/L   CO2 22  19 - 32 mEq/L   Glucose, Bld 118 (*) 70 - 99 mg/dL   BUN 76 (*) 6 - 23 mg/dL   Creatinine, Ser 5.10 (*) 0.50 - 1.35 mg/dL   Calcium 8.3 (*) 8.4 - 10.5 mg/dL   Phosphorus 5.0 (*) 2.3 - 4.6 mg/dL   Albumin 2.0 (*) 3.5 - 5.2 g/dL   GFR calc non Af Amer 10 (*) >90 mL/min   GFR calc Af Amer 12 (*) >90 mL/min   Comment: (NOTE)     The eGFR has been calculated using the CKD EPI equation.     This calculation has not been validated in all clinical situations.     eGFR's  persistently <90 mL/min signify possible Chronic Kidney     Disease.  PROCALCITONIN     Status: None   Collection Time    07/16/13  3:42 AM      Result Value Range   Procalcitonin 19.21     Comment:            Interpretation:     PCT >= 10 ng/mL:     Important systemic inflammatory response,     almost exclusively due to severe bacterial     sepsis or septic shock.     (NOTE)             ICU PCT Algorithm               Non ICU PCT Algorithm        ----------------------------     ------------------------------             PCT < 0.25 ng/mL                 PCT < 0.1 ng/mL         Stopping of antibiotics            Stopping of antibiotics           strongly encouraged.               strongly encouraged.        ----------------------------     ------------------------------           PCT level decrease by               PCT < 0.25 ng/mL           >= 80% from peak PCT           OR PCT 0.25 - 0.5 ng/mL  Stopping of antibiotics                                                 encouraged.         Stopping of antibiotics               encouraged.        ----------------------------     ------------------------------           PCT level decrease by              PCT >= 0.25 ng/mL           < 80% from peak PCT            AND PCT >= 0.5 ng/mL            Continuing antibiotics                                                  encouraged.           Continuing antibiotics                encouraged.        ----------------------------     ------------------------------         PCT level increase compared          PCT > 0.5 ng/mL             with peak PCT AND              PCT >= 0.5 ng/mL             Escalation of antibiotics                                              strongly encouraged.          Escalation of antibiotics            strongly encouraged.  BASIC METABOLIC PANEL     Status: Abnormal   Collection Time    07/16/13  3:42 AM      Result Value Range   Sodium 138  137 - 147  mEq/L   Potassium 3.8  3.7 - 5.3 mEq/L   Chloride 100  96 - 112 mEq/L   CO2 22  19 - 32 mEq/L   Glucose, Bld 118 (*) 70 - 99 mg/dL   BUN 76 (*) 6 - 23 mg/dL   Creatinine, Ser 5.15 (*) 0.50 - 1.35 mg/dL   Calcium 8.3 (*) 8.4 - 10.5 mg/dL   GFR calc non Af Amer 10 (*) >90 mL/min   GFR calc Af Amer 12 (*) >90 mL/min   Comment: (NOTE)     The eGFR has been calculated using the CKD EPI equation.     This calculation has not been validated in all clinical situations.     eGFR's persistently <90 mL/min signify possible Chronic Kidney     Disease.  CBC WITH DIFFERENTIAL     Status: Abnormal   Collection Time    07/16/13  3:42 AM  Result Value Range   WBC 9.8  4.0 - 10.5 K/uL   RBC 2.69 (*) 4.22 - 5.81 MIL/uL   Hemoglobin 8.0 (*) 13.0 - 17.0 g/dL   HCT 23.8 (*) 39.0 - 52.0 %   MCV 88.5  78.0 - 100.0 fL   MCH 29.7  26.0 - 34.0 pg   MCHC 33.6  30.0 - 36.0 g/dL   RDW 15.5  11.5 - 15.5 %   Platelets 125 (*) 150 - 400 K/uL   Neutrophils Relative % 86 (*) 43 - 77 %   Neutro Abs 8.5 (*) 1.7 - 7.7 K/uL   Lymphocytes Relative 6 (*) 12 - 46 %   Lymphs Abs 0.6 (*) 0.7 - 4.0 K/uL   Monocytes Relative 5  3 - 12 %   Monocytes Absolute 0.5  0.1 - 1.0 K/uL   Eosinophils Relative 3  0 - 5 %   Eosinophils Absolute 0.3  0.0 - 0.7 K/uL   Basophils Relative 0  0 - 1 %   Basophils Absolute 0.0  0.0 - 0.1 K/uL  GLUCOSE, CAPILLARY     Status: Abnormal   Collection Time    07/16/13  7:53 AM      Result Value Range   Glucose-Capillary 104 (*) 70 - 99 mg/dL  GLUCOSE, CAPILLARY     Status: Abnormal   Collection Time    07/16/13 11:02 AM      Result Value Range   Glucose-Capillary 142 (*) 70 - 99 mg/dL  RENAL FUNCTION PANEL     Status: Abnormal   Collection Time    07/16/13  3:00 PM      Result Value Range   Sodium 136 (*) 137 - 147 mEq/L   Potassium 4.0  3.7 - 5.3 mEq/L   Chloride 98  96 - 112 mEq/L   CO2 23  19 - 32 mEq/L   Glucose, Bld 108 (*) 70 - 99 mg/dL   BUN 89 (*) 6 - 23 mg/dL    Creatinine, Ser 5.92 (*) 0.50 - 1.35 mg/dL   Calcium 8.4  8.4 - 10.5 mg/dL   Phosphorus 5.9 (*) 2.3 - 4.6 mg/dL   Albumin 2.1 (*) 3.5 - 5.2 g/dL   GFR calc non Af Amer 9 (*) >90 mL/min   GFR calc Af Amer 10 (*) >90 mL/min   Comment: (NOTE)     The eGFR has been calculated using the CKD EPI equation.     This calculation has not been validated in all clinical situations.     eGFR's persistently <90 mL/min signify possible Chronic Kidney     Disease.  GLUCOSE, CAPILLARY     Status: Abnormal   Collection Time    07/16/13  3:22 PM      Result Value Range   Glucose-Capillary 103 (*) 70 - 99 mg/dL  GLUCOSE, CAPILLARY     Status: Abnormal   Collection Time    07/16/13  8:22 PM      Result Value Range   Glucose-Capillary 109 (*) 70 - 99 mg/dL  GLUCOSE, CAPILLARY     Status: Abnormal   Collection Time    07/17/13 12:24 AM      Result Value Range   Glucose-Capillary 140 (*) 70 - 99 mg/dL  GLUCOSE, CAPILLARY     Status: Abnormal   Collection Time    07/17/13  4:27 AM      Result Value Range   Glucose-Capillary 111 (*) 70 - 99 mg/dL  MAGNESIUM  Status: Abnormal   Collection Time    07/17/13  4:30 AM      Result Value Range   Magnesium 2.8 (*) 1.5 - 2.5 mg/dL  RENAL FUNCTION PANEL     Status: Abnormal   Collection Time    07/17/13  4:30 AM      Result Value Range   Sodium 139  137 - 147 mEq/L   Potassium 4.0  3.7 - 5.3 mEq/L   Chloride 99  96 - 112 mEq/L   CO2 21  19 - 32 mEq/L   Glucose, Bld 114 (*) 70 - 99 mg/dL   BUN 102 (*) 6 - 23 mg/dL   Creatinine, Ser 6.67 (*) 0.50 - 1.35 mg/dL   Calcium 8.4  8.4 - 10.5 mg/dL   Phosphorus 6.5 (*) 2.3 - 4.6 mg/dL   Albumin 2.1 (*) 3.5 - 5.2 g/dL   GFR calc non Af Amer 7 (*) >90 mL/min   GFR calc Af Amer 9 (*) >90 mL/min   Comment: (NOTE)     The eGFR has been calculated using the CKD EPI equation.     This calculation has not been validated in all clinical situations.     eGFR's persistently <90 mL/min signify possible Chronic  Kidney     Disease.  BASIC METABOLIC PANEL     Status: Abnormal   Collection Time    07/17/13  4:30 AM      Result Value Range   Sodium 139  137 - 147 mEq/L   Potassium 4.0  3.7 - 5.3 mEq/L   Chloride 99  96 - 112 mEq/L   CO2 20  19 - 32 mEq/L   Glucose, Bld 115 (*) 70 - 99 mg/dL   BUN 101 (*) 6 - 23 mg/dL   Creatinine, Ser 6.65 (*) 0.50 - 1.35 mg/dL   Calcium 8.5  8.4 - 10.5 mg/dL   GFR calc non Af Amer 7 (*) >90 mL/min   GFR calc Af Amer 9 (*) >90 mL/min   Comment: (NOTE)     The eGFR has been calculated using the CKD EPI equation.     This calculation has not been validated in all clinical situations.     eGFR's persistently <90 mL/min signify possible Chronic Kidney     Disease.  CBC WITH DIFFERENTIAL     Status: Abnormal   Collection Time    07/17/13  4:30 AM      Result Value Range   WBC 12.3 (*) 4.0 - 10.5 K/uL   RBC 2.76 (*) 4.22 - 5.81 MIL/uL   Hemoglobin 8.1 (*) 13.0 - 17.0 g/dL   HCT 24.8 (*) 39.0 - 52.0 %   MCV 89.9  78.0 - 100.0 fL   MCH 29.3  26.0 - 34.0 pg   MCHC 32.7  30.0 - 36.0 g/dL   RDW 15.7 (*) 11.5 - 15.5 %   Platelets 160  150 - 400 K/uL   Neutrophils Relative % 87 (*) 43 - 77 %   Neutro Abs 10.7 (*) 1.7 - 7.7 K/uL   Lymphocytes Relative 3 (*) 12 - 46 %   Lymphs Abs 0.3 (*) 0.7 - 4.0 K/uL   Monocytes Relative 6  3 - 12 %   Monocytes Absolute 0.8  0.1 - 1.0 K/uL   Eosinophils Relative 4  0 - 5 %   Eosinophils Absolute 0.4  0.0 - 0.7 K/uL   Basophils Relative 0  0 - 1 %   Basophils Absolute  0.0  0.0 - 0.1 K/uL  GLUCOSE, CAPILLARY     Status: None   Collection Time    07/17/13  7:58 AM      Result Value Range   Glucose-Capillary 95  70 - 99 mg/dL  GLUCOSE, CAPILLARY     Status: Abnormal   Collection Time    07/17/13 11:35 AM      Result Value Range   Glucose-Capillary 129 (*) 70 - 99 mg/dL   Comment 1 Notify RN      MICRO:  IMAGING: Dg Chest Port 1 View  07/16/2013   CLINICAL DATA:  Check endotracheal tube.  EXAM: PORTABLE CHEST - 1  VIEW  COMPARISON:  DG CHEST 1V PORT dated 07/15/2013; DG CHEST 2 VIEW dated 07/04/2013; DG CHEST 1V PORT dated 07/10/2013; DG CHEST 1V PORT dated 07/12/2013; DG CHEST 1V PORT dated 07/13/2013; DG CHEST 1V PORT dated 07/13/2013  FINDINGS: Endotracheal tube is in satisfactory position. Nasogastric tube is followed into the stomach with the tip projecting beyond the inferior margin of the image. Left IJ central line tip projects over the SVC.  Heart is enlarged, stable. Thoracic aorta is calcified. Pulmonary hila appear prominent as well, unchanged. Diffuse bilateral mixed interstitial and airspace disease persists. No definite pleural fluid.  IMPRESSION: Persistent mixed interstitial and airspace disease, favored represent edema.   Electronically Signed   By: Lorin Picket M.D.   On: 07/16/2013 07:39   Dg Abd Portable 1v  07/16/2013   CLINICAL DATA:  Post abdominal aortic aneurysm repair.  EXAM: PORTABLE ABDOMEN - 1 VIEW  COMPARISON:  July 10, 2013  FINDINGS: Nasogastric tube tip and side port are in the stomach. There is mild dilatation in the proximal transverse colon, probably representing mild colonic ileus. There is no evidence suggesting small bowel obstruction. No free air. There is a suprapubic drain.  IMPRESSION: Suspect mild colonic ileus. No bowel obstruction or free air seen on this supine examination. Nasogastric tube tip and side port in stomach.   Electronically Signed   By: Lowella Grip M.D.   On: 07/16/2013 17:26   Assessment/Plan:  73yo M with recent AAA repair complicated by AKi requiring dialysis, Respiratory failure requiring vent support, and fevers, being treated for PsA pneumonia and VRE UTI but developed drug rash on imipenem.  PsA pneumonia: - can continue with aztreonam for now. Lab is checking sensitivities to aztreonma - if he clinically worsens would add moxifloxacin. For discharge would treat for total of 14 days (inc. Days of imipenem), and convert over to oral moxifloxacin/or  levofloxacin.  Questionable VRE UTI: only 30K colonies. Fevers were likely due to pneumonia. He did have pyuria and bacturia but likely due to being oliguric. Recommend to discontinue linezolid.   Drug rash: use prn hydroxyzine or benadryl if needed, should resolve over the next 7 days  Spent 45 min with patient, with greater than 50% spent on reviewing records, coordination of care  Marica Trentham B. [facility] [phone]

## 2013-07-17 NOTE — Progress Notes (Addendum)
[facility]  Subjective  - New rash development 07/16/2012.  Note allergic to cephalosporins with bullae and hives. D/c primaxin  Has pseudomonas in hcap>>start aztreonam  Needs ID consult     Objective 125/67 83 97.6 F (36.4 C) (Oral) 24 85%  Intake/Output Summary (Last 24 hours) at 07/17/13 0857 Last data filed at 07/17/13 0800  Gross per 24 hour  Intake   1430 ml  Output   1060 ml  Net    370 ml    Abdomin distended with + BS.  BM 2 days ago. Skin- macular rash diffusely pt says not itching  Feet cool to touch right cooler than left. Doppler signals Bil.     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 GASTROINTESTINAL  A: Post op ileus NPO plan to start NG tube feeding over the next couple of days.  On TPN  Infections: VRE in urine  Also MRSA and MSSA PCR positive  07/16/2013  Has Pseumdonas pna and VRE urine -cont zyvox and Zosyn Pt with new rash, just started primaxin. Note allergic to cephalosporins with bullae and hives  Plan: D/c primaxin  Has pseudomonas in hcap>>start aztreonam   ID consult was called.    Laurence Slate [facility] 07/17/2013 8:57 AM --  Laboratory Lab Results:  Recent Labs  07/16/13 0342 07/17/13 0430  WBC 9.8 12.3*  HGB 8.0* 8.1*  HCT 23.8* 24.8*  PLT 125* 160   BMET  Recent Labs  07/16/13 1500 07/17/13 0430  NA 136* 139  139  K 4.0 4.0  4.0  CL 98 99  99  CO2 23 21  20   GLUCOSE 108* 114*  115*  BUN 89* 102*  101*  CREATININE 5.92*  6.67*  6.65*  CALCIUM 8.4 8.4  8.5    COAG Lab Results  Component Value Date   INR 1.34 07/10/2013   INR 1.04 07/04/2013   INR 1.13 11/04/2012   No results found for this basename: PTT

## 2013-07-17 NOTE — Progress Notes (Signed)
PARENTERAL NUTRITION CONSULT NOTE - INITIAL  Pharmacy Consult for TPN Indication: prolonged ileus  Allergies  Allergen Reactions  . Omnipaque [Iohexol] Other (See Comments)    Decreased kidney function  . Primaxin [Imipenem] Hives  . Cephalosporins Rash    Blisters    Patient Measurements: Height: 5' 5.5" (166.4 cm) Weight: 238 lb 8.6 oz (108.2 kg) IBW/kg (Calculated) : 62.65  Vital Signs: Temp: 97.6 F (36.4 C) (01/15 0805) Temp src: Oral (01/15 0805) BP: 125/67 mmHg (01/15 0800) Pulse Rate: 83 (01/15 0800) Intake/Output from previous day: 01/14 0701 - 01/15 0700 In: 1430 [I.V.:480; NG/GT:150; IV Piggyback:800] Out: 1040 [Urine:740; Emesis/NG output:300] Intake/Output from this shift:    Labs:  Recent Labs  07/15/13 0500 07/16/13 0342 07/17/13 0430  WBC 10.8* 9.8 12.3*  HGB 8.8* 8.0* 8.1*  HCT 25.4* 23.8* 24.8*  PLT 92* 125* 160     Recent Labs  07/15/13 0500  07/16/13 0342 07/16/13 1500 07/17/13 0430  NA 136*  < > 137  138 136* 139  139  K 4.0  < > 3.8  3.8 4.0 4.0  4.0  CL 99  < > 99  100 98 99  99  CO2 23  < > 22  22 23 21  20   GLUCOSE 125*  < > 118*  118* 108* 114*  115*  BUN 54*  < > 76*  76* 89* 102*  101*  CREATININE 3.81*  < > 5.10*  5.15* 5.92* 6.67*  6.65*  CALCIUM 8.0*  < > 8.3*  8.3* 8.4 8.4  8.5  MG 2.6*  --  2.7*  --  2.8*  PHOS 3.5  < > 5.0* 5.9* 6.5*  PROT 5.4*  --   --   --   --   ALBUMIN 2.0*  < > 2.0* 2.1* 2.1*  AST 30  --   --   --   --   ALT 24  --   --   --   --   ALKPHOS 132*  --   --   --   --   BILITOT 1.0  --   --   --   --   < > = values in this interval not displayed. Estimated Creatinine Clearance: 11.6 ml/min (by C-G formula based on Cr of 6.67).    Recent Labs  07/16/13 2022 07/17/13 0024 07/17/13 0427  GLUCAP 109* 140* 111*    Medical History:  HTN, HLD, OSA, CHF, COPD, DM, anxiety, CAD, prior CABG and AVR, a/p AAA repair   GI: prolonged postop ileus Endo: DM  On ssi moderate scale   cbgs 103-140     4 units insulin last 24 hours Lytes:  Na 139, K 4.0, Mag 2.8, Phos 6.5 Ca8.4 Renal:  Acute on chronic renal failure s/p AAA repair, ischemic insult with cross clamping of renal arteries  He started HD 1/11, was on CRRT which may need to be restarted   SrCr 6.67 with UOP 0.3 ml/kg/hr on IV lasix Pulm: remains on vent Cards: S/p resection and grafting of perirenal AAA 1/8   125/67  HR 85    Not on pressors Hepatobil:  Neuro: no continuous sedation ID:   Pseudomonas PNA and VRE in urine, plan ID consult Best Practices:  Sq hep, iv ppi TPN Access: CVC LIJ 1/11 TPN day#:1 Nutritional Goals:  2200-2300 kCal,100-110 grams of protein per day  Plan:  Start Clinimix with no electrolytes at 40 ml/hr Lipids at 5 ml/hr Will f/u RD  recommendations and need for CRRT or HD. F/u am TPN labs F/u CBGS  [name] 07/17/2013,8:55 AM

## 2013-07-17 NOTE — Progress Notes (Signed)
New orders received. Spoke with RN Anderson Malta who requested evaluation be held until 1/16 at the earliest. Will contact RN on 1/16 to check for readiness for evaluation.  [place], [place] [phone]

## 2013-07-17 NOTE — Procedures (Signed)
Extubation Procedure Note  Patient Details:   Name: [name] DOB: 06/11/1941 MRN: [id number]   Airway Documentation:   Prior to extubation, pt did have + air leak around cuff.    Evaluation  O2 sats: stable throughout Complications: No apparent complications Patient did tolerate procedure well. Bilateral Breath Sounds: Diminished (coarse) Suctioning: Airway;Oral Yes, pt able to speak.  No stridor noted.  No resp distress noted.   Lenna Sciara 07/17/2013, 10:14 AM

## 2013-07-17 NOTE — Progress Notes (Signed)
Patient ID: [name], male   DOB: 03/04/1941, 73 y.o.   MRN: [id number] [facility]  Patient name: [name] Medical record number: [id number] Date of birth: Oct 27, 1940 Age: 73 y.o. Gender: male  Primary Care Provider: [name], MD   Subjective: Patient reports he is more tired this morning than he has been.   VRE UTI 30K Resp Trach: Pseudomonas  Objective: Temp:  [97.1 F (36.2 C)-98.6 F (37 C)] 98.4 F (36.9 C) (01/15 0400) Pulse Rate:  [86-113] 87 (01/15 0700) Resp:  [20-34] 20 (01/15 0700) BP: (97-134)/(53-84) 109/65 mmHg (01/15 0700) SpO2:  [84 %-100 %] 94 % (01/15 0755) Arterial Line BP: (109-145)/(52-70) 131/70 mmHg (01/15 0700) FiO2 (%):  [40 %] 40 % (01/15 0755) Weight:  [238 lb 8.6 oz (108.2 kg)] 238 lb 8.6 oz (108.2 kg) (01/15 0500)  Intake/Output Summary (Last 24 hours) at 07/17/13 0800 Last data filed at 07/17/13 0700  Gross per 24 hour  Intake   1380 ml  Output   1015 ml  Net    365 ml    Physical Exam: General: ETT tube in place Cardiovascular: RRR. Respiratory: Mild rhonchi bilaterally. Abdomen: Soft. Distended. BS  hypoactive Extremities: No erythema, +1 edema. Neuro: Awake. Responds appropriately with head nods. More tired this morning.  Skin: Rash over legs   Laboratory:  Recent Labs Lab 07/15/13 0500 07/16/13 0342 07/17/13 0430  WBC 10.8* 9.8 12.3*  HGB 8.8* 8.0* 8.1*  HCT 25.4* 23.8* 24.8*  PLT 92* 125* 160    Recent Labs Lab 07/12/13 0338  07/14/13 0411  07/15/13 0500  07/16/13 0342 07/16/13 1500 07/17/13 0430  NA 130*  < > 131*  < > 136*  < > 137  138 136* 139  139  K 4.8  < > 4.7  < > 4.0  < > 3.8  3.8 4.0 4.0  4.0  CL 95*  < > 95*  < > 99  < > 99  100 98 99  99  CO2 18*  < > 21  < > 23  < > 22  22 23 21  20   BUN 51*  < > 64*  < > 54*  < > 76*  76* 89* 102*  101*  CREATININE 5.15*  < > 5.44*  < > 3.81*  < > 5.10*  5.15* 5.92* 6.67*  6.65*  CALCIUM 7.0*  < > 8.2*  < > 8.0*  < > 8.3*   8.3* 8.4 8.4  8.5  PROT 5.4*  --  5.7*  --  5.4*  --   --   --   --   BILITOT 1.0  --  1.0  --  1.0  --   --   --   --   ALKPHOS 67  --  84  --  132*  --   --   --   --   ALT 29  --  23  --  24  --   --   --   --   AST 59*  --  23  --  30  --   --   --   --   GLUCOSE 166*  < > 139*  < > 125*  < > 118*  118* 108* 114*  115*  < > = values in this interval not displayed.    Imaging/Diagnostic Tests: Dg Chest Port 1 View  07/16/2013   CLINICAL DATA:  Check endotracheal tube.  EXAM: PORTABLE CHEST -  1 VIEW  COMPARISON:  DG CHEST 1V PORT dated 07/15/2013; DG CHEST 2 VIEW dated 07/04/2013; DG CHEST 1V PORT dated 07/10/2013; DG CHEST 1V PORT dated 07/12/2013; DG CHEST 1V PORT dated 07/13/2013; DG CHEST 1V PORT dated 07/13/2013  FINDINGS: Endotracheal tube is in satisfactory position. Nasogastric tube is followed into the stomach with the tip projecting beyond the inferior margin of the image. Left IJ central line tip projects over the SVC.  Heart is enlarged, stable. Thoracic aorta is calcified. Pulmonary hila appear prominent as well, unchanged. Diffuse bilateral mixed interstitial and airspace disease persists. No definite pleural fluid.  IMPRESSION: Persistent mixed interstitial and airspace disease, favored represent edema.   Electronically Signed   By: Lorin Picket M.D.   On: 07/16/2013 07:39   Dg Abd Portable 1v  07/16/2013   CLINICAL DATA:  Post abdominal aortic aneurysm repair.  EXAM: PORTABLE ABDOMEN - 1 VIEW  COMPARISON:  July 10, 2013  FINDINGS: Nasogastric tube tip and side port are in the stomach. There is mild dilatation in the proximal transverse colon, probably representing mild colonic ileus. There is no evidence suggesting small bowel obstruction. No free air. There is a suprapubic drain.  IMPRESSION: Suspect mild colonic ileus. No bowel obstruction or free air seen on this supine examination. Nasogastric tube tip and side port in stomach.   Electronically Signed   By: Lowella Grip  M.D.   On: 07/16/2013 17:26   Assessment:  1.) POD #2 s/p AAA repair, remains ventilated / sedated, on presors  2.) Acute On chronic renal failure s/p AAA repair - acute injury secondary to hypotension, cross clamping of renal arteries (ischemic insults); FENa 3 suggests ATN. Baseline Cr ~1.5 in the past; hx ARF history with previous peak of 4.73. Electrolytes look good. Cr rising to 6.67 today. UOP still diminished @740cc  yesterday 3.) CHF: 80 mg IV Q6h, by primary team.  4.) DM  5.) CAD 6.) VRE UTI:  Patient currently receiving impenem and zyvox. Afebrile overnight.  7.) Pseudomonas Resp culture: sensitive to imipenem    Plan:  1.) Close BP monitoring,  per CCM / surgery.   2.) Strict I/O  3.) Will continue to monitor UOP,still diminished, on 80 mg IV lasix QID, with rising Scr. Patient may need CRT restart tomorrow or through the weekend. Will hold for now.    [place], DO 07/17/2013, 8:00 AM PGY-2

## 2013-07-17 NOTE — Progress Notes (Signed)
[facility] [place] [facility]  Subjective  - New skin rash overnight thought to be secondary to primaxin stopped last pm, tolerating vent wean   Objective 109/65 87 97.6 F (36.4 C) (Oral) 20 94%  Intake/Output Summary (Last 24 hours) at 07/17/13 0817 Last data filed at 07/17/13 0700  Gross per 24 hour  Intake   1380 ml  Output   1015 ml  Net    365 ml   Abdomen obese, skin edematous maybe some distention, apparently had BM yesterday Extremities right foot cooler than left doppler signals bilaterally Skin- macular rash diffusely pt says not itching  Assessment/Planning: 1. Ileus start TPN today,NG bilious but not high output rest gut a few days hopefully restart tube feeds in the next few days 2. ATN will probably need more dialysis as BUN and Cr trending up and bicarb trending down.  Renal following. 3.  VDRF per CCM hopefully continue to make progress on wean 4.  Rash continue to follow give Benadryl 5.  ID VRE UTI, Pseudomonas Respiratory agree with ID consult for antibiotic management will defer stopping antibiotics date and reculture to CCM  Raianna Slight E 07/17/2013 8:17 AM --  Laboratory Lab Results:  Recent Labs  07/16/13 0342 07/17/13 0430  WBC 9.8 12.3*  HGB 8.0* 8.1*  HCT 23.8* 24.8*  PLT 125* 160   BMET  Recent Labs  07/16/13 1500 07/17/13 0430  NA 136* 139  139  K 4.0 4.0  4.0  CL 98 99  99  CO2 23 21  20   GLUCOSE 108* 114*  115*  BUN 89* 102*  101*  CREATININE 5.92* 6.67*  6.65*  CALCIUM 8.4 8.4  8.5    COAG Lab Results  Component Value Date   INR 1.34 07/10/2013   INR 1.04 07/04/2013   INR 1.13 11/04/2012   No results found for this basename: PTT

## 2013-07-17 NOTE — Progress Notes (Signed)
Pt was mouth breathing, pt changed to 50% VM, sats 98-100%

## 2013-07-17 NOTE — Progress Notes (Signed)
PT Cancellation Note  Patient Details Name: [name] MRN: [id number] DOB: 1941-02-20   Cancelled Treatment:    Reason Eval/Treat Not Completed: Other (comment) (Pt just extubated at 10am will hold until at least 4hrs out)   Melford Aase 07/17/2013, 11:32 AM Elwyn Reach, [place]

## 2013-07-18 ENCOUNTER — Ambulatory Visit: Payer: Medicare Other | Admitting: Cardiology

## 2013-07-18 LAB — GLUCOSE, CAPILLARY
GLUCOSE-CAPILLARY: 96 mg/dL (ref 70–99)
Glucose-Capillary: 98 mg/dL (ref 70–99)
Glucose-Capillary: 110 mg/dL — ABNORMAL HIGH (ref 70–99)
Glucose-Capillary: 129 mg/dL — ABNORMAL HIGH (ref 70–99)
Glucose-Capillary: 103 mg/dL — ABNORMAL HIGH (ref 70–99)

## 2013-07-18 LAB — RENAL FUNCTION PANEL
ALBUMIN: 2 g/dL — AB (ref 3.5–5.2)
BUN: 120 mg/dL — AB (ref 6–23)
CO2: 21 mEq/L (ref 19–32)
CREATININE: 7.71 mg/dL — AB (ref 0.50–1.35)
Calcium: 8.3 mg/dL — ABNORMAL LOW (ref 8.4–10.5)
Chloride: 98 mEq/L (ref 96–112)
GFR calc Af Amer: 7 mL/min — ABNORMAL LOW (ref >90)
GFR, EST NON AFRICAN AMERICAN: 6 mL/min — AB (ref >90)
Glucose, Bld: 105 mg/dL — ABNORMAL HIGH (ref 70–99)
PHOSPHORUS: 7.6 mg/dL — AB (ref 2.3–4.6)
POTASSIUM: 3.9 meq/L (ref 3.7–5.3)
Sodium: 138 mEq/L (ref 137–147)

## 2013-07-18 LAB — CBC WITH DIFFERENTIAL/PLATELET
BASOS ABS: 0 10*3/uL (ref 0.0–0.1)
BASOS PCT: 0 % (ref 0–1)
Eosinophils Absolute: 0.6 10*3/uL (ref 0.0–0.7)
Eosinophils Relative: 5 % (ref 0–5)
HEMATOCRIT: 23.5 % — AB (ref 39.0–52.0)
Hemoglobin: 7.7 g/dL — ABNORMAL LOW (ref 13.0–17.0)
Lymphocytes Relative: 4 % — ABNORMAL LOW (ref 12–46)
Lymphs Abs: 0.5 10*3/uL — ABNORMAL LOW (ref 0.7–4.0)
MCH: 29.3 pg (ref 26.0–34.0)
MCHC: 32.8 g/dL (ref 30.0–36.0)
MCV: 89.4 fL (ref 78.0–100.0)
MONO ABS: 0.7 10*3/uL (ref 0.1–1.0)
Monocytes Relative: 6 % (ref 3–12)
NEUTROS ABS: 10.2 10*3/uL — AB (ref 1.7–7.7)
Neutrophils Relative %: 85 % — ABNORMAL HIGH (ref 43–77)
Platelets: 183 10*3/uL (ref 150–400)
RBC: 2.63 MIL/uL — ABNORMAL LOW (ref 4.22–5.81)
RDW: 15.5 % (ref 11.5–15.5)
WBC: 11.9 10*3/uL — AB (ref 4.0–10.5)

## 2013-07-18 LAB — PREPARE RBC (CROSSMATCH)

## 2013-07-18 LAB — PREALBUMIN: PREALBUMIN: 8.9 mg/dL — AB (ref 17.0–34.0)

## 2013-07-18 LAB — MAGNESIUM: Magnesium: 2.8 mg/dL — ABNORMAL HIGH (ref 1.5–2.5)

## 2013-07-18 NOTE — Progress Notes (Addendum)
[facility] Progress Note   CC:  "feeling apprehensive today"  CV:    0000000 systolic HR  99991111 regular    Filed Vitals:   07/18/13 0758  BP:   Pulse:   Temp: 97.3 F (36.3 C)  Resp:     LUNGS:  extubated yesterday Decreased BS at bases 94% 6LO2NC No recent CXR  ABG    Component Value Date/Time   PHART 7.381 07/15/2013 0433   PCO2ART 40.0 07/15/2013 0433   PO2ART 70.0* 07/15/2013 0433   HCO3 23.7 07/15/2013 0433   TCO2 25 07/15/2013 0433   ACIDBASEDEF 1.0 07/15/2013 0433   O2SAT 93.0 07/15/2013 0433    NEURO:   Awake, up in chair.  Neuro in tact.  RENAL:  Intake/Output Summary (Last 24 hours) at 07/18/13 0836 Last data filed at 07/18/13 0700  Gross per 24 hour  Intake   1250 ml  Output   1460 ml  Net   -210 ml   No data found.  BMET    Component Value Date/Time   NA 138 07/18/2013 0340   K 3.9 07/18/2013 0340   CL 98 07/18/2013 0340   CO2 21 07/18/2013 0340   GLUCOSE 105* 07/18/2013 0340   BUN 120* 07/18/2013 0340   CREATININE 7.71* 07/18/2013 0340   CREATININE 4.17* 01/02/2013 1618   CALCIUM 8.3* 07/18/2013 0340   GFRNONAA 6* 07/18/2013 0340   GFRAA 7* 07/18/2013 0340     HEME/ID:   Afebrile x 24 hrs .Pseudomonas PNA-Cx  ABx:  Aztreonam and Linezolid  CBC    Component Value Date/Time   WBC 11.9* 07/18/2013 0340   RBC 2.63* 07/18/2013 0340   HGB 7.7* 07/18/2013 0340   HCT 23.5* 07/18/2013 0340   PLT 183 07/18/2013 0340   MCV 89.4 07/18/2013 0340   MCH 29.3 07/18/2013 0340   MCHC 32.8 07/18/2013 0340   RDW 15.5 07/18/2013 0340   LYMPHSABS 0.5* 07/18/2013 0340   MONOABS 0.7 07/18/2013 0340   EOSABS 0.6 07/18/2013 0340   BASOSABS 0.0 07/18/2013 0340    Blood Culture    Component Value Date/Time   SDES TRACHEAL ASPIRATE 07/12/2013 1700   SPECREQUEST NONE 07/12/2013 1700   CULT  Value: ABUNDANT PSEUDOMONAS AERUGINOSA Performed at [facility] 07/12/2013 1700   REPTSTATUS PENDING 07/12/2013 1700    ABDOMEN: Distended;  faint BS; +flatus; -BM since Tuesday  EXTREMITIES: + biphasic doppler signals PT bilaterally; bilateral feet are warm.   A/P: 73 y.o. 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 Start TPA; NGT removed -pt UOP improving this am although Cr worsening.  pt is + 10L since admission-may need dialysis.   Nephrology is following -WBC improving; on ABx for pseudomonas PNA-managed by ID-appreciate assistance. -pt with continued rash-treating with hydroxyzine and Benadryl. -postoperative anemia with Hgb 7.7 today down from 8.1 yesterday-tolerating, but may need PRBC's.  Will d/w Dr. Oneida Alar.    Leontine Locket, PA-C [facility] [phone]   History and exam as above.  BUN/Cr continuing to rise daily despite some urine output.  Hemoglobin slowly drifting down.  Most likely dilutional.  Since no plan for dialysis today will transfuse 2  Units of packed red cells for symptomatic anemia.  Currently on 5 L O2.  Will check phosphate level in addition to BUN/Cr tomorrow.  Bowel function returning will start clears today.  Ruta Hinds, MD [facility] [place] Office: [phone] Pager: [phone]

## 2013-07-18 NOTE — Progress Notes (Signed)
Patient ID: [name], male   DOB: 1940/11/05, 73 y.o.   MRN: [id number] [facility]  Patient name: [name] Medical record number: [id number] Date of birth: 04/20/1941 Age: 73 y.o. Gender: male  Primary Care Provider: [name], MD  Subjective: Patient feeling well today, stating he has increasing energy.  Denies fever, chills, sweats.   Objective: Temp:  [97 F (36.1 C)-97.7 F (36.5 C)] 97.5 F (36.4 C) (01/16 0400) Pulse Rate:  [74-94] 81 (01/16 0700) Resp:  [19-31] 22 (01/16 0700) BP: (104-139)/(54-67) 123/60 mmHg (01/16 0700) SpO2:  [85 %-100 %] 98 % (01/16 0700) Arterial Line BP: (118-140)/(55-66) 128/60 mmHg (01/15 1400) FiO2 (%):  [40 %-50 %] 40 % (01/16 0047) Weight:  [233 lb 11 oz (106 kg)] 233 lb 11 oz (106 kg) (01/16 0500)  Intake/Output Summary (Last 24 hours) at 07/18/13 0746 Last data filed at 07/18/13 0700  Gross per 24 hour  Intake   1300 ml  Output   1505 ml  Net   -205 ml    Physical Exam: General: NAD  Cardiovascular: RRR. Respiratory: Mild rhonchi bilaterally. Abdomen: Soft. BS  hypoactive Extremities: No erythema, +1 edema. Neuro: Awake, alert, oriented x 3 Skin: Rash over legs L>R  Laboratory:  Recent Labs Lab 07/16/13 0342 07/17/13 0430 07/18/13 0340  WBC 9.8 12.3* 11.9*  HGB 8.0* 8.1* 7.7*  HCT 23.8* 24.8* 23.5*  PLT 125* 160 183    Recent Labs Lab 07/12/13 0338  07/14/13 0411  07/15/13 0500  07/16/13 1500 07/17/13 0430 07/18/13 0340  NA 130*  < > 131*  < > 136*  < > 136* 139  139 138  K 4.8  < > 4.7  < > 4.0  < > 4.0 4.0  4.0 3.9  CL 95*  < > 95*  < > 99  < > 98 99  99 98  CO2 18*  < > 21  < > 23  < > 23 21  20 21   BUN 51*  < > 64*  < > 54*  < > 89* 102*  101* 120*  CREATININE 5.15*  < > 5.44*  < > 3.81*  < > 5.92* 6.67*  6.65* 7.71*  CALCIUM 7.0*  < > 8.2*  < > 8.0*  < > 8.4 8.4  8.5 8.3*  PROT 5.4*  --  5.7*  --  5.4*  --   --   --   --   BILITOT 1.0  --  1.0  --  1.0  --   --   --   --    ALKPHOS 67  --  84  --  132*  --   --   --   --   ALT 29  --  23  --  24  --   --   --   --   AST 59*  --  23  --  30  --   --   --   --   GLUCOSE 166*  < > 139*  < > 125*  < > 108* 114*  115* 105*  < > = values in this interval not displayed.    Imaging/Diagnostic Tests: Dg Abd Portable 1v  07/16/2013   CLINICAL DATA:  Post abdominal aortic aneurysm repair.  EXAM: PORTABLE ABDOMEN - 1 VIEW  COMPARISON:  July 10, 2013  FINDINGS: Nasogastric tube tip and side port are in the stomach. There is mild dilatation in the proximal transverse colon, probably  representing mild colonic ileus. There is no evidence suggesting small bowel obstruction. No free air. There is a suprapubic drain.  IMPRESSION: Suspect mild colonic ileus. No bowel obstruction or free air seen on this supine examination. Nasogastric tube tip and side port in stomach.   Electronically Signed   By: Lowella Grip M.D.   On: 07/16/2013 17:26   Assessment:  1.) POD #3 s/p AAA repair, remains ventilated / sedated, on presors  2.) AKI on CKD s/p AAA repair - acute injury secondary to hypotension, cross clamping of renal arteries (ischemic insults); FENa 3 suggests ATN. Baseline Cr ~1.5 in the past; hx ARF history with previous peak of 4.73. Electrolytes look good. Cr rising to 7.71 today. UOP still diminished @1480cc  yesterday but overall improving 3.) CHF: 80 mg IV Q6h, by primary team.  4.) DM  5.) CAD 6.) VRE UTI:  Patient currently receiving impenem and zyvox. Afebrile overnight.  7.) Pseudomonas Resp culture: sensitive to imipenem    Plan:  1.) Close BP monitoring,  per CCM / surgery.   2.) Strict I/O  3.) Will continue to monitor UOP,still diminished, on 80 mg IV lasix QID, with rising Scr. May need to restart CVVHD today.   Nolon Rod, DO 07/18/2013, 7:46 AM PGY-2

## 2013-07-18 NOTE — Progress Notes (Addendum)
  Anemia evaluation    Recent Labs Lab 07/14/13 0411 07/15/13 0500 07/16/13 0342 07/17/13 0430 07/18/13 0340  HGB 9.8* 8.8* 8.0* 8.1* 7.7*    No results found for this basename: TROPONINI,  in the last 168 hours  Recent Labs Lab 07/15/13 1500 07/16/13 0342 07/16/13 1500 07/17/13 0430 07/18/13 0340  CREATININE 4.20* 5.10*  5.15* 5.92* 6.67*  6.65* 7.71*    Patient has slowly drifting down hgb since admit; profile c/w Anemia of critical illness (<1-2gm/24h) BP/HR stable MAP 64 with HR 84 No obvious bleed No evidnce of RP bleed or hemodynamic instability  A Anemia of Critical Illness  He has severe CAD but no active MI and recent echo 07/21/13 was okay  P Trigger hgb for this patient is </= 6.9gm%. So, will cancel PRBC transfusion. However, will closely monitor him. Will recheck troponin. If any concern he has Unstable angina or NSTEMI or physician concern about cardiac/vascular status, we can increase transfusion trigger to 8gm%  Will try to give 1 unit prbc each time (has already received 2 U prbc and 2 u ffp this admit)   If any suggestion of bleeding at any time then trnasufse based on clinical grounds  D/w Dr Oneida Alar   Dr. Brand Males, M.D., [facility].C.P Pulmonary and Critical Care Medicine Staff Physician [facility] Pager: [phone], If no answer or between  15:00h - 7:00h: call [phone]  07/18/2013 2:13 PM

## 2013-07-18 NOTE — Progress Notes (Signed)
ANTIBIOTIC CONSULT NOTE - FOLLOW UP  Pharmacy Consult for aztreonam Indication: rule out pneumonia  Allergies  Allergen Reactions  . Omnipaque [Iohexol] Other (See Comments)    Decreased kidney function  . Primaxin [Imipenem] Hives  . Cephalosporins Rash    Blisters    Patient Measurements: Height: 5' 5.5" (166.4 cm) Weight: 233 lb 11 oz (106 kg) IBW/kg (Calculated) : 62.65 Adjusted Body Weight:   Vital Signs: Temp: 97.3 F (36.3 C) (01/16 0758) Temp src: Oral (01/16 0758) BP: 112/65 mmHg (01/16 0800) Pulse Rate: 85 (01/16 0800) Intake/Output from previous day: 01/15 0701 - 01/16 0700 In: 1300 [I.V.:400; NG/GT:150; IV Piggyback:750] Out: 1505 [Urine:1480; Emesis/NG output:25] Intake/Output from this shift: Total I/O In: -  Out: 100 [Urine:100]  Labs:  Recent Labs  07/16/13 0342 07/16/13 1500 07/17/13 0430 07/18/13 0340  WBC 9.8  --  12.3* 11.9*  HGB 8.0*  --  8.1* 7.7*  PLT 125*  --  160 183  CREATININE 5.10*  5.15* 5.92* 6.67*  6.65* 7.71*   Estimated Creatinine Clearance: 9.9 ml/min (by C-G formula based on Cr of 7.71). No results found for this basename: VANCOTROUGH, Corlis Leak, VANCORANDOM, GENTTROUGH, GENTPEAK, GENTRANDOM, TOBRATROUGH, TOBRAPEAK, TOBRARND, AMIKACINPEAK, AMIKACINTROU, AMIKACIN,  in the last 72 hours   Microbiology: Recent Results (from the past 720 hour(s))  SURGICAL PCR SCREEN     Status: Abnormal   Collection Time    07/04/13 10:38 AM      Result Value Range Status   MRSA, PCR POSITIVE (*) NEGATIVE Final   Staphylococcus aureus POSITIVE (*) NEGATIVE Final   Comment:            The Xpert SA Assay (FDA     approved for NASAL specimens     in patients over 72 years of age),     is one component of     a comprehensive surveillance     program.  Test performance has     been validated by [employer] for patients greater     than or equal to 72 year old.     It is not intended     to diagnose infection nor to     guide or  monitor treatment.  URINE CULTURE     Status: None   Collection Time    07/11/13  1:45 PM      Result Value Range Status   Specimen Description URINE, CATHETERIZED   Final   Special Requests NONE   Final   Culture  Setup Time     Final   Value: 07/11/2013 15:28     Performed at [facility]     Final   Value: 30,000 COLONIES/ML     Performed at [facility]   Culture     Final   Value: VANCOMYCIN RESISTANT ENTEROCOCCUS ISOLATED     Note: CRITICAL RESULT CALLED TO, READ BACK BY AND VERIFIED WITH: SHANNON LOVE @ 10:53AM 07/15/13 BY DWEEKS     Performed at [facility]   Report Status 07/15/2013 FINAL   Final   Organism ID, Bacteria VANCOMYCIN RESISTANT ENTEROCOCCUS ISOLATED   Final  CULTURE, RESPIRATORY (NON-EXPECTORATED)     Status: None   Collection Time    07/12/13  5:00 PM      Result Value Range Status   Specimen Description TRACHEAL ASPIRATE   Final   Special Requests NONE   Final   Gram Stain  Final   Value: MODERATE WBC PRESENT, PREDOMINANTLY PMN     FEW SQUAMOUS EPITHELIAL CELLS PRESENT     FEW GRAM NEGATIVE RODS     RARE GRAM POSITIVE RODS     Performed at [facility]   Culture     Final   Value: ABUNDANT PSEUDOMONAS AERUGINOSA     Performed at [facility]   Report Status PENDING   Incomplete   Organism ID, Bacteria PSEUDOMONAS AERUGINOSA   Final    Anti-infectives   Start     Dose/Rate Route Frequency Ordered Stop   07/16/13 2359  aztreonam (AZACTAM) 1 g in dextrose 5 % 50 mL IVPB     1 g 100 mL/hr over 30 Minutes Intravenous 3 times per day 07/16/13 2300     07/15/13 2200  imipenem-cilastatin (PRIMAXIN) 250 mg in sodium chloride 0.9 % 100 mL IVPB  Status:  Discontinued     250 mg 200 mL/hr over 30 Minutes Intravenous Every 12 hours 07/15/13 1040 07/16/13 2237   07/14/13 1000  imipenem-cilastatin (PRIMAXIN) 250 mg in sodium chloride 0.9 % 100 mL IVPB  Status:  Discontinued     250 mg 200 mL/hr over 30  Minutes Intravenous 4 times per day 07/14/13 0935 07/15/13 1040   07/13/13 1015  imipenem-cilastatin (PRIMAXIN) 250 mg in sodium chloride 0.9 % 100 mL IVPB  Status:  Discontinued     250 mg 200 mL/hr over 30 Minutes Intravenous Every 12 hours 07/13/13 0938 07/14/13 0935   07/13/13 1000  linezolid (ZYVOX) IVPB 600 mg     600 mg 300 mL/hr over 60 Minutes Intravenous Every 12 hours 07/13/13 0843     07/10/13 2000  vancomycin (VANCOCIN) IVPB 1000 mg/200 mL premix     1,000 mg 200 mL/hr over 60 Minutes Intravenous Every 12 hours 07/10/13 1431 07/11/13 0900   07/09/13 1249  vancomycin (VANCOCIN) 1,500 mg in sodium chloride 0.9 % 500 mL IVPB     1,500 mg 250 mL/hr over 120 Minutes Intravenous 120 min pre-op 07/09/13 1249 07/10/13 0735      Assessment: 73 yom has been on broad spectrum antibiotics for treatment of pneumonia. Pt is currently on aztreonam (D#3) and linezolid (D#6). Pt is afebrile and WBC is mildly elevated at 11.9. Pt was previously on CRRT but this is currently off. However, renal is considering restarting today. ID recommends DC linezolid since VRE in urine may be a contaminant. ID is having the lab check pseudomonas sensitivities with aztreonam.   Imipenem 1/11 >>1/14 (stopped d/t new rash) Aztreonam 1/14>> Zyvox 1/11>>  1/9 Urine Cx >> 30k VRE 1/10 Trach asp >> abundant pseudo (pan-sens)  Goal of Therapy:  Eradication of infection  Plan:  1. Continue aztreonam 1gm IV Q8H - may need dose adjustment if CRRT starts or if CRRT is not resume and CrCl decreases further 2. Consider DC linezolid per ID recommendations 3. F/u renal plans 4. F/u pseudomonas sensitivies  Saul Dorsi, Rande Lawman 07/18/2013,9:01 AM

## 2013-07-18 NOTE — Progress Notes (Addendum)
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 Intra op course notable for estimated 50 minutes clamp time above the renals. Returned to the ICU post-op on the vent. PCCM asked to assist w/ post-op care.   SIGNIFICANT EVENTS / STUDIES:   1/8: s/p resection and grafting of perirenal AAA 1/8 07/11/13 ECHO ef 55% 1/11: change to pressure control 1/11 renal replacement therapy 07/15/13:Still on some neo. CRRT off; reassess tomorrow. Getting tachypneic with SBT. Normal mental status 07/16/13: 5/5 RR 40s and then copiouys think ET tube secretions. CXR looks very wet 07/17/13: EXTUBATED to QHS BIPAP  LINES / TUBES: OETT 1/8>>> Left IJ PCA 1/8>>> Left rad-aline 1/8>>> Right HD 1/11>>>  CULTURES: Urine 1/9 >>>> 30K VRE resp culture 1/10>>> ABUNDDANT PSEUMONOAS     ANTIBIOTICS: vanc 1/8>>>1/9 Zosyn 1/11>>> ? Given at all Imipenem 07/13/13 >>07/16/13 (rash), aztreonam 07/16/13>> (ID recommend totalk 14 days from 07/13/13) zyvox 1/11>>>1/15 (VRE thought colonizer by ID)    SUBJECTIVE/OVERNIGHT/INTERVAL HX  07/18/13: on 5L post extubation. Doing QHS bipap. Creat rising with high dose lasix. Rash present but not worse. Seen by ID.  Still NPO due to ileus concern but has bowel sounds, no vomit since extubation and has passed flatus        VITAL SIGNS: Temp:  [97 F (36.1 C)-97.7 F (36.5 C)] 97.3 F (36.3 C) (01/16 0758) Pulse Rate:  [74-94] 78 (01/16 0900) Resp:  [19-31] 22 (01/16 0900) BP: (104-134)/(54-65) 134/59 mmHg (01/16 0900) SpO2:  [89 %-100 %] 97 % (01/16 0900) Arterial Line BP: (118-128)/(55-60) 128/60 mmHg (01/15 1400) FiO2 (%):  [40 %-50 %] 40 % (01/16  0047) Weight:  [106 kg (233 lb 11 oz)] 106 kg (233 lb 11 oz) (01/16 0500) HEMODYNAMICS: CVP:  [8 mmHg-11 mmHg] 11 mmHg VENTILATOR SETTINGS: Vent Mode:  [-] BIPAP FiO2 (%):  [40 %-50 %] 40 % Set Rate:  [10 bmp] 10 bmp PEEP:  [5 cmH20] 5 cmH20 Pressure Support:  [5 cmH20] 5 cmH20 INTAKE / OUTPUT: Intake/Output     01/15 0701 - 01/16 0700 01/16 0701 - 01/17 0700   I.V. (mL/kg) 400 (3.8)    NG/GT 150    IV Piggyback 750    Total Intake(mL/kg) 1300 (12.3)    Urine (mL/kg/hr) 1480 (0.6) 100 (0.3)   Emesis/NG output 25 (0)    Total Output 1505 100   Net -205 -100          PHYSICAL EXAMINATION: General: ill appearing but looking better.  Neuro: RASS o0. CAM-ICU neg for delirium,. Moves all 4s - Sitting in chair. Watcher TV. Walked with PT HEENT: Obese neck Cardiovascular:  s1 s2 rrr Lungs: coarse Abdomen: soft, distended. BS +. Long scar - looks well Musculoskeletal: 2+ edema Skin: dopplerable pulses  LABS: PULMONARY  Recent Labs Lab 07/13/13 0936 07/14/13 1100 07/15/13 0433  PHART 7.339* 7.364 7.381  PCO2ART 31.8* 37.5 40.0  PO2ART 76.0* 74.0* 70.0*  HCO3 16.9* 21.4 23.7  TCO2 18 23 25   O2SAT 93.0 94.0 93.0    CBC  Recent Labs Lab 07/16/13 0342 07/17/13 0430 07/18/13 0340  HGB 8.0* 8.1* 7.7*  HCT 23.8* 24.8* 23.5*  WBC 9.8  12.3* 11.9*  PLT 125* 160 183    COAGULATION No results found for this basename: INR,  in the last 168 hours  CARDIAC  No results found for this basename: TROPONINI,  in the last 168 hours No results found for this basename: PROBNP,  in the last 168 hours   CHEMISTRY  Recent Labs Lab 07/14/13 0411  07/15/13 0500 07/15/13 1500 07/16/13 0342 07/16/13 1500 07/17/13 0430 07/18/13 0340  NA 131*  < > 136* 138 137  138 136* 139  139 138  K 4.7  < > 4.0 3.9 3.8  3.8 4.0 4.0  4.0 3.9  CL 95*  < > 99 101 99  100 98 99  99 98  CO2 21  < > 23 23 22  22 23 21  20 21   GLUCOSE 139*  < > 125* 113* 118*  118* 108* 114*  115*  105*  BUN 64*  < > 54* 63* 76*  76* 89* 102*  101* 120*  CREATININE 5.44*  < > 3.81* 4.20* 5.10*  5.15* 5.92* 6.67*  6.65* 7.71*  CALCIUM 8.2*  < > 8.0* 7.8* 8.3*  8.3* 8.4 8.4  8.5 8.3*  MG 2.4  --  2.6*  --  2.7*  --  2.8* 2.8*  PHOS  --   < > 3.5 3.8 5.0* 5.9* 6.5* 7.6*  < > = values in this interval not displayed. Estimated Creatinine Clearance: 9.9 ml/min (by C-G formula based on Cr of 7.71).   LIVER  Recent Labs Lab 07/12/13 0338  07/14/13 0411  07/15/13 0500 07/15/13 1500 07/16/13 0342 07/16/13 1500 07/17/13 0430 07/18/13 0340  AST 59*  --  23  --  30  --   --   --   --   --   ALT 29  --  23  --  24  --   --   --   --   --   ALKPHOS 67  --  84  --  132*  --   --   --   --   --   BILITOT 1.0  --  1.0  --  1.0  --   --   --   --   --   PROT 5.4*  --  5.7*  --  5.4*  --   --   --   --   --   ALBUMIN 2.5*  < > 2.2*  < > 2.0* 2.1* 2.0* 2.1* 2.1* 2.0*  < > = values in this interval not displayed.   INFECTIOUS  Recent Labs Lab 07/14/13 1057 07/15/13 0500 07/16/13 0342  PROCALCITON 23.96 22.72 19.21     ENDOCRINE CBG (last 3)   Recent Labs  07/17/13 2337 07/18/13 0334 07/18/13 0802  GLUCAP 120* 98 96         IMAGING x48h  Dg Abd Portable 1v  07/16/2013   CLINICAL DATA:  Post abdominal aortic aneurysm repair.  EXAM: PORTABLE ABDOMEN - 1 VIEW  COMPARISON:  July 10, 2013  FINDINGS: Nasogastric tube tip and side port are in the stomach. There is mild dilatation in the proximal transverse colon, probably representing mild colonic ileus. There is no evidence suggesting small bowel obstruction. No free air. There is a suprapubic drain.  IMPRESSION: Suspect mild colonic ileus. No bowel obstruction or free air seen on this supine examination. Nasogastric tube tip and side port in stomach.   Electronically Signed   By: Lowella Grip M.D.  On: 07/16/2013 17:26     ASSESSMENT / PLAN:  PULMONARY A:  Acute respiratory failure 2nd to shock, metabolic  derangements, atelectasis. Hx of OSA, COPD. Right > left airspace disease. Ddx: Pseduomonas HCAP, evolving TRALI, volume overload (favor volume and HCAP)  07/18/13: s/p extubation 24h ago. Now on 5L [place]  P:   - Extubate - Bipap qhs + daytime prn  CARDIOVASCULAR A:  S/p resection and grafting of perirenal AAA  1/8 . Hx of CAD and severe AS s/p CABG and AVR w/ Bioprosthetic valve 4/14, last EF 55-60% 8/14. Hx of HTN.  07/18/13: Off pressors x 72h   P:  -MAP goal 65  RENAL A:  Acute kidney injury. Hx of CKD stage 3A. STarted HD 07/13/13  07/15/13 On break from CRRT 07/16/13: No CRRT per renal. Rx lasix 07/08/13: Making urine with High dose lasix but creat rising  P:   -per renal   GASTROINTESTINAL A:   Post op ileus. Nutrition. Hx of GERD.  07/16/13: Vomit 07/17/13: total NG putput only 300cc x 24h which is minimal 07/08/13: Clinically no evidence of ileus  P:   - PPI - advance diet as tolerated  HEMATOLOGIC A:   Acute blood loss anemia after AAA repair. Thrombocytopenia, MODS, dilution, consumption P:  -f/u CBC -[place] heparin  - PRBC for hgb < 7gm% only  INFECTIOUS A:   Fever, new RLL airspace disease  Pseduomonas HCAP - 07/12/13 VRE thought to be colonier  .07/18/13: Afebrile x 5days on abx. Has Pseumdonas pna    P:   -abx per ID consult - see flows  ENDOCRINE A:   DM w/ hyperglycemia  P:   -SSI -assess cortisol  NEUROLOGIC A:   Acute encephalopathy 2nd to shock, renal failure, respiratory failure.   07/18/13: Normal metnasl status x 2  P:   -monitor   SKIN A: Diffuse red Rash 07/16/13 after imipenem. Also new sacaral redness P Per [place]  GLOBAL 07/15/13 and 07/16/13 and 07/17/13 and 07/18/13: No family at bedside   PCCM will see again 07/19/13 and if well will skip 07/20/13    Dr. Brand Males, M.D., [facility].C.P Pulmonary and Critical Care Medicine Staff Physician [facility] Pager: [phone],  If no answer or between  15:00h - 7:00h: call [phone]  07/18/2013 9:46 AM

## 2013-07-18 NOTE — Evaluation (Signed)
Clinical/Bedside Swallow Evaluation Patient Details  Name: [name] MRN: [id number] Date of Birth: 1940/12/01  Today's Date: 07/18/2013 Time: 1224-1244 SLP Time Calculation (min): 20 min  Past Medical History:  Past Medical History  Diagnosis Date  . Hypertension   . Hyperlipidemia   . Prediabetes   . CHF (congestive heart failure) 07/2012; 10/17/2012  . COPD (chronic obstructive pulmonary disease)     a.  History of heavy smoking. PFTs (4/14) with mixed obstructive (COPD) and restrictive (post-ARDS) picture.   . Pneumonia 07/2012    PNA (H1N1 influenza + pneumococcus) in [id number] complicated by respiratory failure/ARDS. Required tracheostomy, now weaned off. Had left empyema requiring chest tube.   . Diabetes mellitus without complication   . History of blood transfusion     "lots since January" (10/17/2012)  . Ischemic cardiomyopathy   . Anemia of chronic disease   . Aortic stenosis     Moderate to severe by echo in 4/14. Bioprosthetic #21 [facility] Ease aortic valve replacement in 4/14.   Marland Kitchen CAD (coronary artery disease)     a. 4/14 CABG: LIMA-LAD, seq SVG-ramus & OM1, SVG-PDA  . Carotid arterial disease     Carotid dopplers (0000000) with [id number] LICA stenosis.   Marland Kitchen AAA (abdominal aortic aneurysm)     5/14 CT showed > 6 cm AAA, also right iliac aneurysm. Not stent graft candidate.   . Esophageal reflux   . Ischemic cardiomyopathy     a. 4/14 LHC: pLAD 95, ost ramus 70-80, mLCx 90, EF 30%  . Chronic systolic heart failure     a. 1/14 ECHO: sev dil LV, EF30-35%, diff HK, mild MR, AS severe, AV grad 35 b. 8/14 ECHO: EF 55-60%, mild biopros AV sten mn grad. 25, RV mild dil, RA mild dil  . Complication of anesthesia     during last surgery had to be given special medicine b/c "something dropped" during surgery   . Anxiety     pt. admits that he has anxiety at times   . Shortness of breath     still goes deer hunting by himself  . OSA (obstructive sleep apnea)     on cpap- every  sleep time.   . Chronic kidney disease     increased creatinine recently - being followed, near kidney failure fr. contrast dye   . Prostate cancer      s/p radiation treatment. - (PT. DENIES)Has indwelling foley.   . Arthritis    Past Surgical History:  Past Surgical History  Procedure Laterality Date  . Transurethral microwave therapy  10/15/2012  . Nasal fracture surgery  1970's  . Tracheostomy  07/2012  . Tracheostomy closure  08/2012  . Anal fissure repair  2008  . Coronary artery bypass graft N/A 10/25/2012    Procedure: CORONARY ARTERY BYPASS GRAFTING (CABG);  Surgeon: Melrose Nakayama, MD;  Location: [place];  Service: Open Heart Surgery;  Laterality: N/A;  times 4 using left internal mammary artery and endoscopically harvested bilateral saphenous vein   . Aortic valve replacement N/A 10/25/2012    Procedure: AORTIC VALVE REPLACEMENT (AVR);  Surgeon: Melrose Nakayama, MD;  Location: [place];  Service: Open Heart Surgery;  Laterality: N/A;  . Intraoperative transesophageal echocardiogram N/A 10/25/2012    Procedure: INTRAOPERATIVE TRANSESOPHAGEAL ECHOCARDIOGRAM;  Surgeon: Melrose Nakayama, MD;  Location: [place];  Service: Open Heart Surgery;  Laterality: N/A;  . Cardiac valve replacement    . Abdominal aortic aneurysm repair N/A 07/10/2013  Procedure: Resection and Graftiong of perirenal AAA; Insertion 14 x 8 Hemashield Graft Aorta to Left Common Iliac and to Right Common Femoral Artery With Ligastion of Right External and Interanl Iliac Artery;  Surgeon: Mal Misty, MD;  Location: [facility];  Service: Vascular;  Laterality: N/A;   HPI:  73 year old male w/ multiple medical co-morbids: OSA, CAD, prior CABG and AVR w/ bioprosthetic valve,  s/p resection and grafting of perirenal AAA 1/8. Intra op course notable for estimated 50 minutes clamp time above the renals. Returned to the ICU post-op on the vent. OETT 1/8>>>1/15 - BiPap.  Today 4 l . NPO due to ileus concern but has bowel  sounds, no vomit since extubation and has passed flatus   Assessment / Plan / Recommendation Clinical Impression  Pt presents with generally functional swallow s/p seven-day intubation.  RR compatible with respiratory/swallow cycle; quality of phonation is good.  There were no concerning s/s for aspiration excluding when pt consumed multiple, successive boluses of thin liquid.  Recommend advancing diet per MD given concerns for ileus.  No consistency restrictions recommended - pt should be cautious initially with POs and take small bites/sips.  Will f/u x 1 to ensure safety with diet.     Aspiration Risk  Mild    Diet Recommendation Regular;Thin liquid   Liquid Administration via: Cup;Straw Medication Administration: Whole meds with liquid Supervision: Patient able to self feed;Staff to assist with self feeding Compensations: Slow rate;Small sips/bites Postural Changes and/or Swallow Maneuvers: Seated upright 90 degrees    Other  Recommendations Oral Care Recommendations: Oral care BID   Follow Up Recommendations  None    Frequency and Duration min 1 x/week  1 week       SLP Swallow Goals  see care plan   Swallow Study Prior Functional Status       General Date of Onset: 07/10/13 \ Type of Study: Bedside swallow evaluation Previous Swallow Assessment: dysphagia with trach 1/14 - followed at [place] Prior to this Study: NPO Temperature Spikes Noted: No Respiratory Status: Nasal cannula (4l) History of Recent Intubation: Yes Length of Intubations (days): 7 days Date extubated: 07/17/13 Behavior/Cognition: Alert;Cooperative;Pleasant mood Oral Cavity - Dentition: Missing dentition Self-Feeding Abilities: Able to feed self;Needs assist Patient Positioning: Upright in bed Baseline Vocal Quality: Clear Volitional Cough: Strong Volitional Swallow: Able to elicit    Oral/Motor/Sensory Function Overall Oral Motor/Sensory Function: Appears within functional limits for  tasks assessed   Ice Chips Ice chips: Within functional limits Presentation: Spoon   Thin Liquid Thin Liquid: Impaired Presentation: Cup Pharyngeal  Phase Impairments: Cough - Immediate (with consumption of multiple, successive boluses) ; no cough with single boluses   Nectar Thick Nectar Thick Liquid: Not tested   Honey Thick Honey Thick Liquid: Not tested   Puree Puree: Within functional limits Presentation: Self Fed;Spoon   Solid   Whitman Meinhardt L. [place], [place] CCC/SLP Pager [phone]     Solid: Within functional limits Presentation: Self Fed       Juan Quam Laurice 07/18/2013,1:33 PM

## 2013-07-18 NOTE — Progress Notes (Signed)
[place] for Infectious Disease    Date of Admission:  07/10/2013   Total days of antibiotics 8        Day 6 linezolid        Day 3 aztreonam        ( 4 days imi, 1 days vanco)   ID: [name] is a 73 y.o. male  with recent AAA repair complicated by [facility] requiring dialysis, Respiratory failure requiring vent support, and fevers, being treated for PsA pneumonia and VRE UTI but developed drug rash on imipenem.  Active Problems:   AAA (abdominal aortic aneurysm)   Pseudomonas pneumonia   VRE (vancomycin resistant enterococcus) culture positive    Subjective: Remains afebrile, and bipap in evening but still on nasal cannula  Medications:  . antiseptic oral rinse  15 mL Mouth Rinse QID  . arformoterol  15 mcg Nebulization BID  . aspirin  324 mg Per NG tube Daily  . aztreonam  1 g Intravenous Q8H  . bisacodyl  10 mg Rectal Daily  . budesonide (PULMICORT) nebulizer solution  0.5 mg Nebulization BID  . chlorhexidine  15 mL Mouth Rinse BID  . feeding supplement (PRO-STAT SUGAR FREE 64)  30 mL Per Tube Q1400  . furosemide  80 mg Intravenous Q6H  . heparin subcutaneous  5,000 Units Subcutaneous Q8H  . insulin aspart  0-15 Units Subcutaneous Q4H  . pantoprazole (PROTONIX) IV  40 mg Intravenous QHS  . polyvinyl alcohol  2 drop Both Eyes BID    Objective: Vital signs in last 24 hours: Temp:  [97 F (36.1 C)-97.7 F (36.5 C)] 97.3 F (36.3 C) (01/16 0758) Pulse Rate:  [74-92] 78 (01/16 0900) Resp:  [19-29] 22 (01/16 0900) BP: (104-134)/(54-65) 134/59 mmHg (01/16 0900) SpO2:  [89 %-100 %] 97 % (01/16 0900) Arterial Line BP: (119-128)/(57-60) 128/60 mmHg (01/15 1400) FiO2 (%):  [40 %-50 %] 40 % (01/16 0047) Weight:  [233 lb 11 oz (106 kg)] 233 lb 11 oz (106 kg) (01/16 0500) General: older than stated age, chronically ill appearing  Neuro: Moves all 4s spontaneously  HEENT: dry oral mucosa  Neck: left IJ  Cardiovascular: s1 s2, rrr, no g/m/r  Lungs: coarse lung  sounds throughout anterior lung fields  Abdomen: soft, wound site clean, distended  Musculoskeletal: 2+ edema  Skin: scattered erythamatous macular patches to chest,abdomen, legs; scattered echymosis     Lab Results  Recent Labs  07/17/13 0430 07/18/13 0340  WBC 12.3* 11.9*  HGB 8.1* 7.7*  HCT 24.8* 23.5*  NA 139  139 138  K 4.0  4.0 3.9  CL 99  99 98  CO2 21  20 21   BUN 102*  101* 120*  CREATININE 6.67*  6.65* 7.71*   Liver Panel  Recent Labs  07/17/13 0430 07/18/13 0340  ALBUMIN 2.1* 2.0*    Microbiology: 1/10 sputum; PsA ( pansensitive), aztreonam S pending 1/9 urine: VRE but amp S, 30K  Studies/Results: Dg Abd Portable 1v  07/16/2013   CLINICAL DATA:  Post abdominal aortic aneurysm repair.  EXAM: PORTABLE ABDOMEN - 1 VIEW  COMPARISON:  July 10, 2013  FINDINGS: Nasogastric tube tip and side port are in the stomach. There is mild dilatation in the proximal transverse colon, probably representing mild colonic ileus. There is no evidence suggesting small bowel obstruction. No free air. There is a suprapubic drain.  IMPRESSION: Suspect mild colonic ileus. No bowel obstruction or free air seen on this supine examination. Nasogastric tube tip  and side port in stomach.   Electronically Signed   By: Lowella Grip M.D.   On: 07/16/2013 17:26     Assessment/Plan: 73yo M with recent AAA repair complicated by AKi requiring dialysis, Respiratory failure requiring vent support, and fevers, being treated for PsA pneumonia and VRE UTI but developed drug rash on imipenem.   PsA pneumonia:  - can continue with aztreonam for now. Lab is checking sensitivities to aztreonam to come out over the weekend - if he clinically worsens would add moxifloxacin. For discharge would treat for total of 14 days (inc. Days of imipenem), and convert over to oral moxifloxacin/or levofloxacin.  - continue with renal dosing  Questionable VRE UTI: only 30K colonies. Likely colonizer.  Discontinued linezolid today  aki on ckd = bumped in cr today, likely due to diuresis, defer to nephrology for continued management  Drug rash: use prn hydroxyzine or benadryl if needed, should resolve over the next 7 days   Dr Lucianne Lei dam available for questions over the weekend. I will return to see the patient on monday  Tran Arzuaga, [facility] for Infectious Diseases Cell: [phone] Pager: [phone]  07/18/2013, 10:23 AM

## 2013-07-18 NOTE — Progress Notes (Signed)
I have seen and examined this patient and agree with the plan of care . Encouraging sign that urine output improving will continue to monitor [facility] W 07/18/2013, 11:47 AM

## 2013-07-19 DIAGNOSIS — R0902 Hypoxemia: Secondary | ICD-10-CM

## 2013-07-19 LAB — PHOSPHORUS: PHOSPHORUS: 8.6 mg/dL — AB (ref 2.3–4.6)

## 2013-07-19 LAB — CBC WITH DIFFERENTIAL/PLATELET
BASOS ABS: 0 10*3/uL (ref 0.0–0.1)
Basophils Relative: 0 % (ref 0–1)
Eosinophils Absolute: 0.5 10*3/uL (ref 0.0–0.7)
Eosinophils Relative: 5 % (ref 0–5)
HEMATOCRIT: 23.9 % — AB (ref 39.0–52.0)
Hemoglobin: 7.9 g/dL — ABNORMAL LOW (ref 13.0–17.0)
LYMPHS ABS: 0.7 10*3/uL (ref 0.7–4.0)
LYMPHS PCT: 6 % — AB (ref 12–46)
MCH: 29.7 pg (ref 26.0–34.0)
MCHC: 33.1 g/dL (ref 30.0–36.0)
MCV: 89.8 fL (ref 78.0–100.0)
MONO ABS: 0.5 10*3/uL (ref 0.1–1.0)
Monocytes Relative: 5 % (ref 3–12)
Neutro Abs: 9.2 10*3/uL — ABNORMAL HIGH (ref 1.7–7.7)
Neutrophils Relative %: 84 % — ABNORMAL HIGH (ref 43–77)
PLATELETS: 213 10*3/uL (ref 150–400)
RBC: 2.66 MIL/uL — AB (ref 4.22–5.81)
RDW: 15.7 % — ABNORMAL HIGH (ref 11.5–15.5)
WBC: 11 10*3/uL — AB (ref 4.0–10.5)

## 2013-07-19 LAB — CULTURE, RESPIRATORY W GRAM STAIN

## 2013-07-19 LAB — CULTURE, RESPIRATORY

## 2013-07-19 LAB — BASIC METABOLIC PANEL
BUN: 139 mg/dL — ABNORMAL HIGH (ref 6–23)
CO2: 21 meq/L (ref 19–32)
Calcium: 8.1 mg/dL — ABNORMAL LOW (ref 8.4–10.5)
Chloride: 97 mEq/L (ref 96–112)
Creatinine, Ser: 8.1 mg/dL — ABNORMAL HIGH (ref 0.50–1.35)
GFR calc Af Amer: 7 mL/min — ABNORMAL LOW (ref >90)
GFR calc non Af Amer: 6 mL/min — ABNORMAL LOW (ref >90)
Glucose, Bld: 107 mg/dL — ABNORMAL HIGH (ref 70–99)
Potassium: 3.7 mEq/L (ref 3.7–5.3)
Sodium: 138 mEq/L (ref 137–147)

## 2013-07-19 LAB — CBC
HEMATOCRIT: 23.8 % — AB (ref 39.0–52.0)
Hemoglobin: 7.8 g/dL — ABNORMAL LOW (ref 13.0–17.0)
MCH: 29.3 pg (ref 26.0–34.0)
MCHC: 32.8 g/dL (ref 30.0–36.0)
MCV: 89.5 fL (ref 78.0–100.0)
Platelets: 191 10*3/uL (ref 150–400)
RBC: 2.66 MIL/uL — ABNORMAL LOW (ref 4.22–5.81)
RDW: 15.6 % — AB (ref 11.5–15.5)
WBC: 10.4 10*3/uL (ref 4.0–10.5)

## 2013-07-19 LAB — GLUCOSE, CAPILLARY
GLUCOSE-CAPILLARY: 93 mg/dL (ref 70–99)
Glucose-Capillary: 101 mg/dL — ABNORMAL HIGH (ref 70–99)
Glucose-Capillary: 109 mg/dL — ABNORMAL HIGH (ref 70–99)
Glucose-Capillary: 123 mg/dL — ABNORMAL HIGH (ref 70–99)
Glucose-Capillary: 96 mg/dL (ref 70–99)
Glucose-Capillary: 96 mg/dL (ref 70–99)

## 2013-07-19 LAB — MAGNESIUM: MAGNESIUM: 2.8 mg/dL — AB (ref 1.5–2.5)

## 2013-07-19 LAB — TROPONIN I

## 2013-07-19 MED ORDER — DEXTROSE 5 % IV SOLN
500.0000 mg | Freq: Three times a day (TID) | INTRAVENOUS | Status: DC
  Administered 2013-07-19 – 2013-07-25 (×18): 500 mg via INTRAVENOUS
  Filled 2013-07-19 (×25): qty 0.5

## 2013-07-19 NOTE — Progress Notes (Signed)
Patient ID: [name], male   DOB: 05-Sep-1940, 73 y.o.   MRN: [id number] [facility]  Patient name: [name] Medical record number: [id number] Date of birth: June 02, 1941 Age: 73 y.o. Gender: male  Primary Care Provider: [name], MD  Subjective:   Feeling better this am. Sitting up in chair. Reports good urine output.  Passing flatus.  Objective: Temp:  [97.2 F (36.2 C)-97.8 F (36.6 C)] 97.2 F (36.2 C) (01/17 0000) Pulse Rate:  [77-106] 85 (01/17 0445) Resp:  [16-30] 16 (01/17 0445) BP: (95-134)/(47-89) 116/60 mmHg (01/16 2000) SpO2:  [88 %-100 %] 100 % (01/17 0445) FiO2 (%):  [40 %] 40 % (01/17 0400) Weight:  [239 lb 10.2 oz (108.7 kg)] 239 lb 10.2 oz (108.7 kg) (01/17 0445)  Intake/Output Summary (Last 24 hours) at 07/19/13 0713 Last data filed at 07/19/13 0600  Gross per 24 hour  Intake   1240 ml  Output   2403 ml  Net  -1163 ml   Physical Exam: General: well appearing, NAD.  Cardiovascular: RRR. Respiratory: Crackles noted - bibasilar.  Abdomen: Soft. Slightly tender to palpation. Mildly distended.  Incision healing well.  Extremities: 2+ LE edema.  Neuro: No focal deficits.  Laboratory:  Recent Labs Lab 07/18/13 0340 07/19/13 07/19/13 0500  WBC 11.9* 10.4 11.0*  HGB 7.7* 7.8* 7.9*  HCT 23.5* 23.8* 23.9*  PLT 183 191 213    Recent Labs Lab 07/14/13 0411  07/15/13 0500  07/17/13 0430 07/18/13 0340 07/19/13 0500  NA 131*  < > 136*  < > 139  139 138 138  K 4.7  < > 4.0  < > 4.0  4.0 3.9 3.7  CL 95*  < > 99  < > 99  99 98 97  CO2 21  < > 23  < > 21  20 21 21   BUN 64*  < > 54*  < > 102*  101* 120* 139*  CREATININE 5.44*  < > 3.81*  < > 6.67*  6.65* 7.71* 8.10*  CALCIUM 8.2*  < > 8.0*  < > 8.4  8.5 8.3* 8.1*  PROT 5.7*  --  5.4*  --   --   --   --   BILITOT 1.0  --  1.0  --   --   --   --   ALKPHOS 84  --  132*  --   --   --   --   ALT 23  --  24  --   --   --   --   AST 23  --  30  --   --   --   --   GLUCOSE  139*  < > 125*  < > 114*  115* 105* 107*  < > = values in this interval not displayed.  Imaging/Diagnostic Tests: No results found.  Assessment:  1.) POD #3 s/p AAA repair 2.) AKI on CKD s/p AAA repair - acute injury secondary to hypotension, cross clamping of renal arteries (ischemic insults); FENa 3 suggests ATN. Baseline Cr ~1.5 in the past 3.) CHF: 80 mg IV Q6h, by primary team.  4.) DM  5.) CAD 6.) VRE UTI:  Patient currently receiving impenem and zyvox 7.) Pseudomonas Resp culture: sensitive to imipenem    Plan:  1.) Close BP monitoring,  per CCM / surgery.   2.) Will continue to monitor UOP - Urine output improving (2400 mL over past 24 hours) - Creatinine continuing to rise slowly.  8.10 today (up from 7.71 yesterday) - As patient is diuresing and doing well clinically, will hold off on dialysis today.   Coral Spikes, DO 07/19/2013, 7:13 AM PGY-2

## 2013-07-19 NOTE — Progress Notes (Signed)
BIPAP removed from pt's room, and replaced with CPAP. CPAP set up per pt's home setting of 13, with full face mask. Advised pt to notify RN when ready to go on CPAP. RT will continue to monitor.

## 2013-07-19 NOTE — Progress Notes (Signed)
ANTIBIOTIC CONSULT NOTE - FOLLOW UP  Pharmacy Consult for aztreonam Indication: rule out pneumonia  Allergies  Allergen Reactions  . Omnipaque [Iohexol] Other (See Comments)    Decreased kidney function  . Primaxin [Imipenem] Hives  . Cephalosporins Rash    Blisters    Patient Measurements: Height: 5' 5.5" (166.4 cm) Weight: 239 lb 10.2 oz (108.7 kg) IBW/kg (Calculated) : 62.65  Vital Signs: Temp: 97.6 F (36.4 C) (01/17 1202) Temp src: Oral (01/17 1202) BP: 93/63 mmHg (01/17 1200) Pulse Rate: 98 (01/17 1200) Intake/Output from previous day: 01/16 0701 - 01/17 0700 In: 1380 [P.O.:750; I.V.:480; IV Piggyback:150] Out: 2553 [Urine:2550; Stool:3] Intake/Output from this shift: Total I/O In: 460 [P.O.:360; I.V.:100] Out: 725 [Urine:725]  Labs:  Recent Labs  07/17/13 0430 07/18/13 0340 07/19/13 07/19/13 0500  WBC 12.3* 11.9* 10.4 11.0*  HGB 8.1* 7.7* 7.8* 7.9*  PLT 160 183 191 213  CREATININE 6.67*  6.65* 7.71*  --  8.10*   Estimated Creatinine Clearance: 9.6 ml/min (by C-G formula based on Cr of 8.1).   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  Test performance has     been validated by [employer] for patients greater     than or equal to 46 year old.     It is not intended     to diagnose infection nor to     guide or monitor treatment.  URINE CULTURE     Status: None   Collection Time    07/11/13  1:45 PM      Result Value Range Status   Specimen Description URINE, CATHETERIZED   Final   Special Requests NONE   Final   Culture  Setup  Time     Final   Value: 07/11/2013 15:28     Performed at [facility]     Final   Value: 30,000 COLONIES/ML     Performed at [facility]   Culture     Final   Value: VANCOMYCIN RESISTANT ENTEROCOCCUS ISOLATED     Note: CRITICAL RESULT CALLED TO, READ BACK BY AND VERIFIED WITH: SHANNON LOVE @ 10:53AM 07/15/13 BY DWEEKS     Performed at [facility]   Report Status 07/15/2013 FINAL   Final   Organism ID, Bacteria VANCOMYCIN RESISTANT ENTEROCOCCUS ISOLATED   Final  CULTURE, RESPIRATORY (NON-EXPECTORATED)     Status: None   Collection Time    07/12/13  5:00 PM      Result Value Range Status   Specimen Description TRACHEAL ASPIRATE   Final   Special Requests NONE   Final   Gram Stain     Final   Value: MODERATE WBC PRESENT, PREDOMINANTLY PMN     FEW SQUAMOUS EPITHELIAL CELLS PRESENT     FEW GRAM NEGATIVE RODS     RARE GRAM POSITIVE RODS  Performed at [facility]     Final   Value: ABUNDANT PSEUDOMONAS AERUGINOSA     Performed at [facility]   Report Status 07/19/2013 FINAL   Final   Organism ID, Bacteria PSEUDOMONAS AERUGINOSA   Final    Anti-infectives   Start     Dose/Rate Route Frequency Ordered Stop   07/16/13 2359  aztreonam (AZACTAM) 1 g in dextrose 5 % 50 mL IVPB     1 g 100 mL/hr over 30 Minutes Intravenous 3 times per day 07/16/13 2300     07/15/13 2200  imipenem-cilastatin (PRIMAXIN) 250 mg in sodium chloride 0.9 % 100 mL IVPB  Status:  Discontinued     250 mg 200 mL/hr over 30 Minutes Intravenous Every 12 hours 07/15/13 1040 07/16/13 2237   07/14/13 1000  imipenem-cilastatin (PRIMAXIN) 250 mg in sodium chloride 0.9 % 100 mL IVPB  Status:  Discontinued     250 mg 200 mL/hr over 30 Minutes Intravenous 4 times per day 07/14/13 0935 07/15/13 1040   07/13/13 1015  imipenem-cilastatin (PRIMAXIN) 250 mg in sodium chloride 0.9 % 100 mL IVPB  Status:  Discontinued     250 mg 200 mL/hr over 30 Minutes  Intravenous Every 12 hours 07/13/13 0938 07/14/13 0935   07/13/13 1000  linezolid (ZYVOX) IVPB 600 mg  Status:  Discontinued     600 mg 300 mL/hr over 60 Minutes Intravenous Every 12 hours 07/13/13 0843 07/18/13 1020   07/10/13 2000  vancomycin (VANCOCIN) IVPB 1000 mg/200 mL premix     1,000 mg 200 mL/hr over 60 Minutes Intravenous Every 12 hours 07/10/13 1431 07/11/13 0900   07/09/13 1249  vancomycin (VANCOCIN) 1,500 mg in sodium chloride 0.9 % 500 mL IVPB     1,500 mg 250 mL/hr over 120 Minutes Intravenous 120 min pre-op 07/09/13 1249 07/10/13 0735      Assessment: 73 yom being treated for pneumonia with aztreonam (D#4). Pt is afebrile and WBC is mildly elevated at 11. Patient with poor renal function, but increasing UOP. Renal plans to hold off on restarting CRRT today.  Goal of Therapy:  Eradication of infection  Plan:  1. Reduce aztreonam to 500mg  IV q8h since CRRT is not being resumed 2. Continue to follow renal plans/function, LOT, clinical progression, ID recommendations  Shyenne Maggard D. Alysabeth Scalia, PharmD, BCPS Clinical Pharmacist Pager: [phone] 07/19/2013 12:52 PM

## 2013-07-19 NOTE — Progress Notes (Deleted)
Pt with Arterial SBP mid 70s the past 30 minutes.  Cardiac monitor  HR 70 NSR,  U/O  approx 20-25 cc/hr, R CT drainage <50 cc/hr.  Pt alert, oriented, using PCA dilaudid sparingly for pain post op, Right posterior surgical dressing dry and intact. IVF NS at 100 cc/hr infusing.     Findings called to Dr Roxy Manns. Albumin 5% 250 cc ordered and infusing .  Will continue to monitor VS, U/O and CT output.

## 2013-07-19 NOTE — Progress Notes (Signed)
Name: [name] MRN: [id number] DOB: 07-30-1940    ADMISSION DATE:  07/10/2013 CONSULTATION DATE:  1/8 LOS 9 days   REFERRING MD :  Kellie Simmering   CHIEF COMPLAINT:  Post-op   BRIEF PATIENT DESCRIPTION:  73 year old male w/ multiple medical co-morbids: OSA, CAD, prior CABG and AVR w/ bioprosthetic valve,  s/p resection and grafting of perirenal AAA 1/8. Intra op course notable for estimated 50 minutes clamp time above the renals. Returned to the ICU post-op on the vent. PCCM asked to assist w/ post-op care.   SIGNIFICANT EVENTS / STUDIES:   1/8: s/p resection and grafting of perirenal AAA 1/8 07/11/13 ECHO ef 55% 1/11: change to pressure control 1/11 renal replacement therapy 07/15/13:Still on some neo. CRRT off; reassess tomorrow. Getting tachypneic with SBT. Normal mental status 07/16/13: 5/5 RR 40s and then copiouys think ET tube secretions. CXR looks very wet 07/17/13: EXTUBATED to QHS BIPAP 1/17 change BiPAP to CPAP (uses at home).  LINES / TUBES: OETT 1/8>>>1/15 Left IJ PCA 1/8>>> Left rad-aline 1/8>>> Right HD 1/11>>>  CULTURES: Urine 1/9 >>>> 30K VRE Resp culture 1/10>>> ABUNDDANT PSEUMONOAS  ANTIBIOTICS: vanc 1/8>>>1/9 Zosyn 1/11>>> ? Given at all Imipenem 07/13/13 >>07/16/13 (rash), aztreonam 07/16/13>> (ID recommend totalk 14 days from 07/13/13) zyvox 1/11>>>1/15 (VRE thought colonizer by ID) Aztreonam 1/15>>>  SUBJECTIVE/OVERNIGHT/INTERVAL HX Tolerated BiPAP overnight, no desaturation and feels well.  VITAL SIGNS: Temp:  [97.2 F (36.2 C)-98.8 F (37.1 C)] 97.4 F (36.3 C) (01/17 0747) Pulse Rate:  [77-106] 81 (01/17 0800) Resp:  [16-30] 19 (01/17 0800) BP: (95-136)/(47-89) 136/71 mmHg (01/17 0800) SpO2:  [88 %-100 %] 100 % (01/17 0800) FiO2 (%):  [40 %] 40 % (01/17 0400) Weight:  [108.7 kg (239 lb 10.2 oz)] 108.7 kg (239 lb 10.2 oz) (01/17 0445) HEMODYNAMICS: CVP:  [8 mmHg-12 mmHg] 10 mmHg CO:  [11 L/min] 11 L/min VENTILATOR SETTINGS: Vent Mode:  [-]  BIPAP FiO2 (%):  [40 %] 40 % Set Rate:  [10 bmp] 10 bmp INTAKE / OUTPUT: Intake/Output     01/16 0701 - 01/17 0700 01/17 0701 - 01/18 0700   P.O. 750 120   I.V. (mL/kg) 480 (4.4) 20 (0.2)   NG/GT     IV Piggyback 150    Total Intake(mL/kg) 1380 (12.7) 140 (1.3)   Urine (mL/kg/hr) 2550 (1) 125 (0.7)   Emesis/NG output     Stool 3 (0)    Total Output 2553 125   Net -1173 +15         PHYSICAL EXAMINATION: General: Chronically ill appearing but feels well. Neuro: Alert and interactive, following all commands. HEENT: Obese neck Cardiovascular: RRR, Nl S1/S2, -M/R/G. Lungs: Decreased BS at the bases. Abdomen: soft, distended. BS +. Long scar - looks well Musculoskeletal: 2+ edema Skin: dopplerable pulses  LABS: PULMONARY  Recent Labs Lab 07/13/13 0936 07/14/13 1100 07/15/13 0433  PHART 7.339* 7.364 7.381  PCO2ART 31.8* 37.5 40.0  PO2ART 76.0* 74.0* 70.0*  HCO3 16.9* 21.4 23.7  TCO2 18 23 25   O2SAT 93.0 94.0 93.0   CBC  Recent Labs Lab 07/18/13 0340 07/19/13 07/19/13 0500  HGB 7.7* 7.8* 7.9*  HCT 23.5* 23.8* 23.9*  WBC 11.9* 10.4 11.0*  PLT 183 191 213   COAGULATION No results found for this basename: INR,  in the last 168 hours  CARDIAC  Recent Labs Lab 07/19/13 0500  TROPONINI <0.30   No results found for this basename: PROBNP,  in the last 168 hours  CHEMISTRY  Recent Labs Lab 07/15/13 0500  07/16/13 0342 07/16/13 1500 07/17/13 0430 07/18/13 0340 07/19/13 0500  NA 136*  < > 137  138 136* 139  139 138 138  K 4.0  < > 3.8  3.8 4.0 4.0  4.0 3.9 3.7  CL 99  < > 99  100 98 99  99 98 97  CO2 23  < > 22  22 23 21  20 21 21   GLUCOSE 125*  < > 118*  118* 108* 114*  115* 105* 107*  BUN 54*  < > 76*  76* 89* 102*  101* 120* 139*  CREATININE 3.81*  < > 5.10*  5.15* 5.92* 6.67*  6.65* 7.71* 8.10*  CALCIUM 8.0*  < > 8.3*  8.3* 8.4 8.4  8.5 8.3* 8.1*  MG 2.6*  --  2.7*  --  2.8* 2.8* 2.8*  PHOS 3.5  < > 5.0* 5.9* 6.5* 7.6* 8.6*  < > =  values in this interval not displayed. Estimated Creatinine Clearance: 9.6 ml/min (by C-G formula based on Cr of 8.1).   LIVER  Recent Labs Lab 07/14/13 0411  07/15/13 0500 07/15/13 1500 07/16/13 0342 07/16/13 1500 07/17/13 0430 07/18/13 0340  AST 23  --  30  --   --   --   --   --   ALT 23  --  24  --   --   --   --   --   ALKPHOS 84  --  132*  --   --   --   --   --   BILITOT 1.0  --  1.0  --   --   --   --   --   PROT 5.7*  --  5.4*  --   --   --   --   --   ALBUMIN 2.2*  < > 2.0* 2.1* 2.0* 2.1* 2.1* 2.0*  < > = values in this interval not displayed.   INFECTIOUS  Recent Labs Lab 07/14/13 1057 07/15/13 0500 07/16/13 0342  PROCALCITON 23.96 22.72 19.21   ENDOCRINE CBG (last 3)   Recent Labs  07/18/13 2010 07/18/13 2357 07/19/13 0429  GLUCAP 103* 101* 96   IMAGING x48h  No results found.  ASSESSMENT / PLAN:  PULMONARY A:  Acute respiratory failure 2nd to shock, metabolic derangements, atelectasis. Hx of OSA, COPD. Right > left airspace disease. Ddx: Pseduomonas HCAP, evolving TRALI, volume overload (favor volume and HCAP)  P:   - Extubated and on O2.  Will tolerated. - Change BiPAP to CPAP QHS.  CARDIOVASCULAR A:  S/p resection and grafting of perirenal AAA  1/8 . Hx of CAD and severe AS s/p CABG and AVR w/ Bioprosthetic valve 4/14, last EF 55-60% 8/14. Hx of HTN.  P:  - MAP goal 65.  RENAL A:  Acute kidney injury. Hx of CKD stage 3A. STarted HD 07/13/13  07/15/13 On break from CRRT 07/16/13: No CRRT per renal. Rx lasix 07/08/13: Making urine with High dose lasix but creat rising  P:   - Per renal  GASTROINTESTINAL A:   Post op ileus. Nutrition. Hx of GERD.  07/16/13: Vomit 07/17/13: total NG putput only 300cc x 24h which is minimal 07/08/13: Clinically no evidence of ileus  P:   - PPI - Advance diet as tolerated  HEMATOLOGIC A:   Acute blood loss anemia after AAA repair. Thrombocytopenia, MODS, dilution, consumption P:  -  F/u CBC - [place] heparin  - PRBC for  hgb < 7gm% only  INFECTIOUS A:   Fever, new RLL airspace disease  Pseduomonas HCAP - 07/12/13 VRE thought to be colonier  07/18/13: Afebrile x 5days on abx. Has Pseumdonas pna   P:   - Abx per ID consult - see flows  ENDOCRINE A:   DM w/ hyperglycemia  P:   - SSI  NEUROLOGIC A:   Acute encephalopathy 2nd to shock, renal failure, respiratory failure.   07/18/13: Normal metnasl status x 2  P:   - Monitor  SKIN A: Diffuse red Rash 07/16/13 after imipenem. Also new sacaral redness P - Per WOC.  GLOBAL 07/15/13 and 07/16/13 and 07/17/13 and 07/18/13: No family at bedside  Rush Farmer, M.D. [facility] Pulmonary/Critical Care Medicine. Pager: [phone]. After hours pager: [phone].

## 2013-07-19 NOTE — Progress Notes (Signed)
[facility]  Subjective  - Feeling stronger   Objective 116/60 85 97.4 F (36.3 C) (Oral) 16 100%  Intake/Output Summary (Last 24 hours) at 07/19/13 0749 Last data filed at 07/19/13 0700  Gross per 24 hour  Intake   1380 ml  Output   2403 ml  Net  -1023 ml   Abdomen no incisional drainage, groins clean Feet pink warm 4-5 L O2   Assessment/Planning: Slowly improving.  Advance diet.  OOB ambulate Renal function slowly rising BUN/Cr, Phos 8.6 dialysis on hold per renal will defer to them  [name] 07/19/2013 7:49 AM --  Laboratory Lab Results:  Recent Labs  07/19/13 07/19/13 0500  WBC 10.4 11.0*  HGB 7.8* 7.9*  HCT 23.8* 23.9*  PLT 191 213   BMET  Recent Labs  07/18/13 0340 07/19/13 0500  NA 138 138  K 3.9 3.7  CL 98 97  CO2 21 21  GLUCOSE 105* 107*  BUN 120* 139*  CREATININE 7.71* 8.10*  CALCIUM 8.3* 8.1*    COAG Lab Results  Component Value Date   INR 1.34 07/10/2013   INR 1.04 07/04/2013   INR 1.13 11/04/2012   No results found for this basename: PTT

## 2013-07-19 NOTE — Significant Event (Signed)
At bedside to mobilize patient from bed to chair. Nigel Bridgeman RN and I are working to transfer the patient unfortunately the Triple Lumen catheter in the left IJ came out. Pressure dressing applied. CVL checked and its intact. No untoward signs and symptoms noted on the patient. No bleeding or shortness of breath. Stayed in chair for 1.5 hours and requested to be put back to bed.

## 2013-07-19 NOTE — Progress Notes (Signed)
I have seen and examined this patient and agree with the plan of care. There is no indication for dialysis and encouraged by the improvement in urine output. Will still continue to follow [facility] W 07/19/2013, 11:05 AM

## 2013-07-20 DIAGNOSIS — J9589 Other postprocedural complications and disorders of respiratory system, not elsewhere classified: Secondary | ICD-10-CM

## 2013-07-20 DIAGNOSIS — G4733 Obstructive sleep apnea (adult) (pediatric): Secondary | ICD-10-CM

## 2013-07-20 LAB — BASIC METABOLIC PANEL
BUN: 144 mg/dL — ABNORMAL HIGH (ref 6–23)
CALCIUM: 8.2 mg/dL — AB (ref 8.4–10.5)
CO2: 20 mEq/L (ref 19–32)
CREATININE: 8.16 mg/dL — AB (ref 0.50–1.35)
Chloride: 96 mEq/L (ref 96–112)
GFR calc Af Amer: 7 mL/min — ABNORMAL LOW (ref >90)
GFR calc non Af Amer: 6 mL/min — ABNORMAL LOW (ref >90)
Glucose, Bld: 135 mg/dL — ABNORMAL HIGH (ref 70–99)
Potassium: 3.7 mEq/L (ref 3.7–5.3)
Sodium: 139 mEq/L (ref 137–147)

## 2013-07-20 LAB — GLUCOSE, CAPILLARY
GLUCOSE-CAPILLARY: 122 mg/dL — AB (ref 70–99)
Glucose-Capillary: 124 mg/dL — ABNORMAL HIGH (ref 70–99)
Glucose-Capillary: 118 mg/dL — ABNORMAL HIGH (ref 70–99)
Glucose-Capillary: 155 mg/dL — ABNORMAL HIGH (ref 70–99)
Glucose-Capillary: 108 mg/dL — ABNORMAL HIGH (ref 70–99)
Glucose-Capillary: 138 mg/dL — ABNORMAL HIGH (ref 70–99)

## 2013-07-20 LAB — CBC WITH DIFFERENTIAL/PLATELET
BASOS ABS: 0 10*3/uL (ref 0.0–0.1)
Basophils Relative: 0 % (ref 0–1)
Eosinophils Absolute: 0.5 10*3/uL (ref 0.0–0.7)
Eosinophils Relative: 5 % (ref 0–5)
HCT: 24.5 % — ABNORMAL LOW (ref 39.0–52.0)
Hemoglobin: 8.4 g/dL — ABNORMAL LOW (ref 13.0–17.0)
LYMPHS ABS: 0.7 10*3/uL (ref 0.7–4.0)
Lymphocytes Relative: 7 % — ABNORMAL LOW (ref 12–46)
MCH: 30.1 pg (ref 26.0–34.0)
MCHC: 34.3 g/dL (ref 30.0–36.0)
MCV: 87.8 fL (ref 78.0–100.0)
MONO ABS: 0.5 10*3/uL (ref 0.1–1.0)
Monocytes Relative: 5 % (ref 3–12)
Neutro Abs: 8.4 10*3/uL — ABNORMAL HIGH (ref 1.7–7.7)
Neutrophils Relative %: 83 % — ABNORMAL HIGH (ref 43–77)
PLATELETS: 216 10*3/uL (ref 150–400)
RBC: 2.79 MIL/uL — ABNORMAL LOW (ref 4.22–5.81)
RDW: 15.8 % — AB (ref 11.5–15.5)
WBC: 10.1 10*3/uL (ref 4.0–10.5)

## 2013-07-20 LAB — MAGNESIUM: Magnesium: 2.6 mg/dL — ABNORMAL HIGH (ref 1.5–2.5)

## 2013-07-20 LAB — PHOSPHORUS: Phosphorus: 8.2 mg/dL — ABNORMAL HIGH (ref 2.3–4.6)

## 2013-07-20 MED ORDER — OXYCODONE-ACETAMINOPHEN 5-325 MG PO TABS: 1.0000 | ORAL_TABLET | ORAL | Status: DC | PRN

## 2013-07-20 MED ORDER — DOCUSATE SODIUM 100 MG PO CAPS
100.0000 mg | ORAL_CAPSULE | Freq: Two times a day (BID) | ORAL | Status: DC | PRN
  Administered 2013-07-23: 100 mg via ORAL
  Filled 2013-07-20: qty 1

## 2013-07-20 NOTE — Progress Notes (Signed)
Patient ID: SHAWNELL GADALETA, male   DOB: 1940-09-21, 73 y.o.   MRN: [id number] [facility]  Patient name: [name] Medical record number: [id number] Date of birth: 02/25/1941 Age: 73 y.o. Gender: male  Primary Care Provider: [name], MD  Subjective:   No complaints this AM, doing well, tolerating diet, denies SOB or increased LE edema   Objective: Temp:  [97.3 F (36.3 C)-97.9 F (36.6 C)] 97.5 F (36.4 C) (01/18 0400) Pulse Rate:  [81-98] 93 (01/18 0700) Resp:  [16-28] 23 (01/18 0700) BP: (93-164)/(48-89) 124/73 mmHg (01/18 0700) SpO2:  [90 %-100 %] 100 % (01/18 0700) Weight:  [228 lb 8 oz (103.647 kg)] 228 lb 8 oz (103.647 kg) (01/18 0500)  Intake/Output Summary (Last 24 hours) at 07/20/13 0739 Last data filed at 07/20/13 0600  Gross per 24 hour  Intake 1199.67 ml  Output   3600 ml  Net -2400.33 ml   Physical Exam: General: well appearing, NAD.  Cardiovascular: RRR. Respiratory: Crackles noted - bibasilar.  Abdomen: Soft. Slightly tender to palpation. Mildly distended.  Incision healing well.  Extremities: 1+ LE edema.  Neuro: No focal deficits.  Laboratory:  Recent Labs Lab 07/19/13 07/19/13 0500 07/20/13 0451  WBC 10.4 11.0* 10.1  HGB 7.8* 7.9* 8.4*  HCT 23.8* 23.9* 24.5*  PLT 191 213 216    Recent Labs Lab 07/14/13 0411  07/15/13 0500  07/18/13 0340 07/19/13 0500 07/20/13 0451  NA 131*  < > 136*  < > 138 138 139  K 4.7  < > 4.0  < > 3.9 3.7 3.7  CL 95*  < > 99  < > 98 97 96  CO2 21  < > 23  < > 21 21 20   BUN 64*  < > 54*  < > 120* 139* 144*  CREATININE 5.44*  < > 3.81*  < > 7.71* 8.10* 8.16*  CALCIUM 8.2*  < > 8.0*  < > 8.3* 8.1* 8.2*  PROT 5.7*  --  5.4*  --   --   --   --   BILITOT 1.0  --  1.0  --   --   --   --   ALKPHOS 84  --  132*  --   --   --   --   ALT 23  --  24  --   --   --   --   AST 23  --  30  --   --   --   --   GLUCOSE 139*  < > 125*  < > 105* 107* 135*  < > = values in this interval not  displayed.  Imaging/Diagnostic Tests: No results found.  Assessment:  1.) POD #4 s/p AAA repair 2.) AKI on CKD s/p AAA repair - acute injury secondary to hypotension, cross clamping of renal arteries (ischemic insults); FENa 3 suggests ATN. Baseline Cr ~1.5 in the past 3.) CHF: 80 mg IV Q6h, by primary team.  4.) DM  5.) CAD 6.) VRE UTI:  Transitioned to Aztreonam  7.) Pseudomonas Resp culture: sensitive to imipenem    Plan:  1) Close BP monitoring,  per CCM / surgery.  2) Will continue to monitor UOP - Urine output improving (3642ml/24 hrs) - Creatinine stable, hopefully will start to decrease with post-ATN diuresis  - As patient is diuresing and doing well clinically, will hold off on dialysis again today  Nolon Rod, DO 07/20/2013, 7:39 AM PGY-2 I have seen and examined  this patient and agree with the plan of care. Baseline creatinine 1.5 appears to be diuresing very well recovering from ischemic ATN. Stop lasix today as patient appears to be in diuretic phase of recovery. Will D/C femoral dialysis catheter. Sarabeth Benton W 07/20/2013, 11:33 AM

## 2013-07-20 NOTE — Progress Notes (Signed)
Name: [name] MRN: [id number] DOB: January 31, 1941    ADMISSION DATE:  07/10/2013 CONSULTATION DATE:  1/8 LOS 10 days  REFERRING MD :  Kellie Simmering   CHIEF COMPLAINT:  Post-op   BRIEF PATIENT DESCRIPTION:  73 year old male w/ multiple medical co-morbids: OSA, CAD, prior CABG and AVR w/ bioprosthetic valve,  s/p resection and grafting of perirenal AAA 1/8. Intra op course notable for estimated 50 minutes clamp time above the renals. Returned to the ICU post-op on the vent. PCCM asked to assist w/ post-op care.   SIGNIFICANT EVENTS / STUDIES:   1/8: s/p resection and grafting of perirenal AAA 1/8 07/11/13 ECHO ef 55% 1/11: change to pressure control 1/11 renal replacement therapy 07/15/13:Still on some neo. CRRT off; reassess tomorrow. Getting tachypneic with SBT. Normal mental status 07/16/13: 5/5 RR 40s and then copiouys think ET tube secretions. CXR looks very wet 07/17/13: EXTUBATED to QHS BIPAP 1/17 change BiPAP to CPAP (uses at home).  LINES / TUBES: OETT 1/8>>>1/15 Left IJ PCA 1/8>>> Left rad-aline 1/8>>> Right HD 1/11>>>  CULTURES: Urine 1/9 >>>> 30K VRE Resp culture 1/10>>> ABUNDDANT PSEUMONOAS  ANTIBIOTICS: vanc 1/8>>>1/9 Zosyn 1/11>>> ? Given at all Imipenem 07/13/13 >>07/16/13 (rash), aztreonam 07/16/13>> (ID recommend totalk 14 days from 07/13/13) zyvox 1/11>>>1/15 (VRE thought colonizer by ID) Aztreonam 1/15>>>  SUBJECTIVE/OVERNIGHT/INTERVAL HX CPAP overnight tolerated for OSA not respiratory failure.  VITAL SIGNS: Temp:  [97.2 F (36.2 C)-97.9 F (36.6 C)] 97.2 F (36.2 C) (01/18 0819) Pulse Rate:  [82-104] 101 (01/18 1000) Resp:  [16-28] 23 (01/18 1000) BP: (93-164)/(48-89) 108/64 mmHg (01/18 1000) SpO2:  [90 %-100 %] 100 % (01/18 1000) Weight:  [103.647 kg (228 lb 8 oz)] 103.647 kg (228 lb 8 oz) (01/18 0500) HEMODYNAMICS:   VENTILATOR SETTINGS:   INTAKE / OUTPUT: Intake/Output     01/17 0701 - 01/18 0700 01/18 0701 - 01/19 0700   P.O. 720    I.V.  (mL/kg) 329.7 (3.2) 30 (0.3)   IV Piggyback 150    Total Intake(mL/kg) 1199.7 (11.6) 30 (0.3)   Urine (mL/kg/hr) 3600 (1.4) 700 (1.6)   Stool     Total Output 3600 700   Net -2400.3 -670        Stool Occurrence 3 x     PHYSICAL EXAMINATION: General: Chronically ill appearing but feels well. Neuro: Alert and interactive, following all commands. HEENT: Obese neck Cardiovascular: RRR, Nl S1/S2, -M/R/G. Lungs: Decreased BS at the bases. Abdomen: soft, distended. BS +. Long scar - looks well Musculoskeletal: 2+ edema Skin: dopplerable pulses  LABS: PULMONARY  Recent Labs Lab 07/14/13 1100 07/15/13 0433  PHART 7.364 7.381  PCO2ART 37.5 40.0  PO2ART 74.0* 70.0*  HCO3 21.4 23.7  TCO2 23 25  O2SAT 94.0 93.0   CBC  Recent Labs Lab 07/19/13 07/19/13 0500 07/20/13 0451  HGB 7.8* 7.9* 8.4*  HCT 23.8* 23.9* 24.5*  WBC 10.4 11.0* 10.1  PLT 191 213 216   COAGULATION No results found for this basename: INR,  in the last 168 hours  CARDIAC  Recent Labs Lab 07/19/13 0500  TROPONINI <0.30   No results found for this basename: PROBNP,  in the last 168 hours  CHEMISTRY  Recent Labs Lab 07/16/13 0342 07/16/13 1500 07/17/13 0430 07/18/13 0340 07/19/13 0500 07/20/13 0451  NA 137  138 136* 139  139 138 138 139  K 3.8  3.8 4.0 4.0  4.0 3.9 3.7 3.7  CL 99  100 98 99  99 98 97  96  CO2 22  22 23 21  20 21 21 20   GLUCOSE 118*  118* 108* 114*  115* 105* 107* 135*  BUN 76*  76* 89* 102*  101* 120* 139* 144*  CREATININE 5.10*  5.15* 5.92* 6.67*  6.65* 7.71* 8.10* 8.16*  CALCIUM 8.3*  8.3* 8.4 8.4  8.5 8.3* 8.1* 8.2*  MG 2.7*  --  2.8* 2.8* 2.8* 2.6*  PHOS 5.0* 5.9* 6.5* 7.6* 8.6* 8.2*   Estimated Creatinine Clearance: 9.3 ml/min (by C-G formula based on Cr of 8.16).  LIVER  Recent Labs Lab 07/14/13 0411  07/15/13 0500 07/15/13 1500 07/16/13 0342 07/16/13 1500 07/17/13 0430 07/18/13 0340  AST 23  --  30  --   --   --   --   --   ALT 23  --  24   --   --   --   --   --   ALKPHOS 84  --  132*  --   --   --   --   --   BILITOT 1.0  --  1.0  --   --   --   --   --   PROT 5.7*  --  5.4*  --   --   --   --   --   ALBUMIN 2.2*  < > 2.0* 2.1* 2.0* 2.1* 2.1* 2.0*  < > = values in this interval not displayed.  INFECTIOUS  Recent Labs Lab 07/14/13 1057 07/15/13 0500 07/16/13 0342  PROCALCITON 23.96 22.72 19.21   ENDOCRINE CBG (last 3)   Recent Labs  07/19/13 1727 07/19/13 2020 07/20/13 0012  GLUCAP 109* 96 124*   IMAGING x48h  No results found.  ASSESSMENT / PLAN:  PULMONARY A:  Acute respiratory failure 2nd to shock, metabolic derangements, atelectasis. Hx of OSA, COPD. Right > left airspace disease. Ddx: Pseduomonas HCAP, evolving TRALI, volume overload (favor volume and HCAP)  P:   - Extubated and on O2.  Will tolerated. - CPAP as ordered.  CARDIOVASCULAR A:  S/p resection and grafting of perirenal AAA  1/8 . Hx of CAD and severe AS s/p CABG and AVR w/ Bioprosthetic valve 4/14, last EF 55-60% 8/14. Hx of HTN.  P:  - MAP goal 65.  RENAL A:  Acute kidney injury. Hx of CKD stage 3A. STarted HD 07/13/13  07/15/13 On break from CRRT 07/16/13: No CRRT per renal. Rx lasix 07/08/13: Making urine with High dose lasix but creat rising  P:   - Per renal  GASTROINTESTINAL A:   Post op ileus. Nutrition. Hx of GERD.  07/16/13: Vomit 07/17/13: total NG putput only 300cc x 24h which is minimal 07/08/13: Clinically no evidence of ileus  P:   - PPI - Advance diet as tolerated  HEMATOLOGIC A:   Acute blood loss anemia after AAA repair. 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 Has [place] pna   P:   - Abx per ID consult - see flows  ENDOCRINE A:   DM w/ hyperglycemia  P:   - SSI  NEUROLOGIC A:    Acute encephalopathy 2nd to shock, renal failure, respiratory failure.   07/18/13: Normal metnasl status x 2  P:   - Monitor  SKIN A: Diffuse  red Rash 07/16/13 after imipenem. Also new sacaral redness P - Per WOC.  GLOBAL HD catheter out today, doing well from a PCCM standpoint, PCCM will sign off, please call back if needed.  Rush Farmer, M.D. [facility] Pulmonary/Critical Care Medicine. Pager: [phone]. After hours pager: [phone].

## 2013-07-20 NOTE — Progress Notes (Signed)
Pt transferred to 2W11, pt vs WNL, AAOX4, receiving RN in room upon arrival. Additional bedside report given, pt safety maintained.

## 2013-07-20 NOTE — Progress Notes (Signed)
Placed pt. On cpap. Pt. Is tolerating well at this time.  

## 2013-07-20 NOTE — Progress Notes (Signed)
Pt states this morning that he felt CPAP pressure of 13cmH2o was too much for him, said "it felt like he was in a hurricane". i set up CPAP yesterday with home setting of 13cmH2o per pt. Pt said he wanted to "try a pressure of 10cmH20". CPAP settings adjusted per pt's recommendation. Advised pt if 10cmH20 was too strong tonight, to notify RN and we would place him on AUTO CPAP instead. RT will continue to monitor.

## 2013-07-20 NOTE — Progress Notes (Addendum)
[facility]  Subjective  - Feels ok   Objective 124/73 93 97.5 F (36.4 C) (Oral) 23 100%  Intake/Output Summary (Last 24 hours) at 07/20/13 0745 Last data filed at 07/20/13 0600  Gross per 24 hour  Intake 1199.67 ml  Output   3600 ml  Net -2400.33 ml   Abdomen: obese, incision healing Extremities feet warm well perfused 2-5 liters O2   Assessment/Planning: ATN: BUN Cr still elevated but seems to be plateauing today, HD per renal PRN, will remove femoral catheter if no dialysis plans today  Pneumonia continue antibiotics Nutrition eating now should improve with time Needs peripheral IV since will most likely d/c femoral line,  If no peripheral IV possible will need IJ central line prior to transfer from ICU Transfer 2 W after above  Rorey Hodges E 07/20/2013 7:45 AM --  Laboratory Lab Results:  Recent Labs  07/19/13 0500 07/20/13 0451  WBC 11.0* 10.1  HGB 7.9* 8.4*  HCT 23.9* 24.5*  PLT 213 216   BMET  Recent Labs  07/19/13 0500 07/20/13 0451  NA 138 139  K 3.7 3.7  CL 97 96  CO2 21 20  GLUCOSE 107* 135*  BUN 139* 144*  CREATININE 8.10* 8.16*  CALCIUM 8.1* 8.2*    COAG Lab Results  Component Value Date   INR 1.34 07/10/2013   INR 1.04 07/04/2013   INR 1.13 11/04/2012   No results found for this basename: PTT

## 2013-07-21 DIAGNOSIS — N390 Urinary tract infection, site not specified: Secondary | ICD-10-CM

## 2013-07-21 DIAGNOSIS — N189 Chronic kidney disease, unspecified: Secondary | ICD-10-CM

## 2013-07-21 LAB — CBC WITH DIFFERENTIAL/PLATELET
Basophils Absolute: 0.1 10*3/uL (ref 0.0–0.1)
Basophils Relative: 1 % (ref 0–1)
EOS PCT: 3 % (ref 0–5)
Eosinophils Absolute: 0.3 10*3/uL (ref 0.0–0.7)
HCT: 24.8 % — ABNORMAL LOW (ref 39.0–52.0)
Hemoglobin: 8.4 g/dL — ABNORMAL LOW (ref 13.0–17.0)
Lymphocytes Relative: 8 % — ABNORMAL LOW (ref 12–46)
Lymphs Abs: 0.8 10*3/uL (ref 0.7–4.0)
MCH: 29.6 pg (ref 26.0–34.0)
MCHC: 33.9 g/dL (ref 30.0–36.0)
MCV: 87.3 fL (ref 78.0–100.0)
MONO ABS: 0.4 10*3/uL (ref 0.1–1.0)
Monocytes Relative: 4 % (ref 3–12)
NEUTROS ABS: 7.8 10*3/uL — AB (ref 1.7–7.7)
Neutrophils Relative %: 84 % — ABNORMAL HIGH (ref 43–77)
PLATELETS: 229 10*3/uL (ref 150–400)
RBC: 2.84 MIL/uL — ABNORMAL LOW (ref 4.22–5.81)
RDW: 15.9 % — ABNORMAL HIGH (ref 11.5–15.5)
WBC: 9.4 10*3/uL (ref 4.0–10.5)

## 2013-07-21 LAB — BASIC METABOLIC PANEL
BUN: 150 mg/dL — ABNORMAL HIGH (ref 6–23)
CHLORIDE: 96 meq/L (ref 96–112)
CO2: 19 meq/L (ref 19–32)
Calcium: 8.2 mg/dL — ABNORMAL LOW (ref 8.4–10.5)
Creatinine, Ser: 7.43 mg/dL — ABNORMAL HIGH (ref 0.50–1.35)
GFR calc Af Amer: 8 mL/min — ABNORMAL LOW (ref >90)
GFR, EST NON AFRICAN AMERICAN: 7 mL/min — AB (ref >90)
GLUCOSE: 107 mg/dL — AB (ref 70–99)
POTASSIUM: 4 meq/L (ref 3.7–5.3)
Sodium: 139 mEq/L (ref 137–147)

## 2013-07-21 LAB — GLUCOSE, CAPILLARY
GLUCOSE-CAPILLARY: 113 mg/dL — AB (ref 70–99)
GLUCOSE-CAPILLARY: 100 mg/dL — AB (ref 70–99)
GLUCOSE-CAPILLARY: 139 mg/dL — AB (ref 70–99)
Glucose-Capillary: 126 mg/dL — ABNORMAL HIGH (ref 70–99)
Glucose-Capillary: 103 mg/dL — ABNORMAL HIGH (ref 70–99)
Glucose-Capillary: 155 mg/dL — ABNORMAL HIGH (ref 70–99)

## 2013-07-21 LAB — MAGNESIUM: Magnesium: 2.4 mg/dL (ref 1.5–2.5)

## 2013-07-21 MED ORDER — PANTOPRAZOLE SODIUM 40 MG PO PACK
40.0000 mg | PACK | Freq: Every day | ORAL | Status: DC
  Administered 2013-07-21 – 2013-07-24 (×4): 40 mg
  Filled 2013-07-21 (×6): qty 20

## 2013-07-21 MED ORDER — INSULIN ASPART 100 UNIT/ML ~~LOC~~ SOLN
0.0000 [IU] | Freq: Three times a day (TID) | SUBCUTANEOUS | Status: DC
Start: 2013-07-21 — End: 2013-07-25
  Administered 2013-07-21: 2 [IU] via SUBCUTANEOUS
  Administered 2013-07-22: 3 [IU] via SUBCUTANEOUS
  Administered 2013-07-23 (×2): 2 [IU] via SUBCUTANEOUS
  Administered 2013-07-23 – 2013-07-24 (×2): 3 [IU] via SUBCUTANEOUS
  Administered 2013-07-24: 2 [IU] via SUBCUTANEOUS

## 2013-07-21 NOTE — Progress Notes (Addendum)
Physical Therapy Treatment Patient Details Name: [name] MRN: [id number] DOB: 1940-10-31 Today's Date: 07/21/2013 Time: [id number] PT Time Calculation (min): 25 min  PT Assessment / Plan / Recommendation  History of Present Illness ANEURYSM ABDOMINAL AORTIC REPAIR & RIGHT COMMON ILIAC ARTERY ANEURYSM REPAIR (N/A) as a surgical intervention    PT Comments   Pt progressing well,  sats are maintained at mid to low 90's with exertion  EHR in 110's, but with moderate dyspnea at times.   Follow Up Recommendations  SNF     Does the patient have the potential to tolerate intense rehabilitation     Barriers to Discharge        Equipment Recommendations  Other (comment) (TBA)    Recommendations for Other Services    Frequency Min 3X/week   Progress towards PT Goals Progress towards PT goals: Progressing toward goals  Plan Current plan remains appropriate    Precautions / Restrictions Precautions Precautions: Fall Restrictions Weight Bearing Restrictions: No   Pertinent Vitals/Pain See above    Mobility  Bed Mobility Overal bed mobility: Needs Assistance Bed Mobility: Sit to Supine Sit to supine: Supervision Transfers Overall transfer level: Needs assistance Equipment used: Rolling walker (2 wheeled) Transfers: Sit to/from Stand Sit to Stand: Min guard ( ) General transfer comment: cues for safety Ambulation/Gait Ambulation/Gait assistance: Min guard Ambulation Distance (Feet): 140 Feet Assistive device: Rolling walker (2 wheeled) (pushing w/c) Gait Pattern/deviations: Step-through pattern;Decreased step length - right;Decreased step length - left;Decreased stride length Gait velocity: slower, but can change speed appreciably Stairs: Yes Stairs assistance: +2 safety/equipment Stair Management: Step to pattern;Forwards;Two rails    Exercises     PT Diagnosis:    PT Problem List:   PT Treatment Interventions:     PT Goals (current goals can now be found in the  care plan section) Acute Rehab PT Goals Patient Stated Goal: back home and able to do for myself PT Goal Formulation: With patient Time For Goal Achievement: 07/31/13 Potential to Achieve Goals: Good  Visit Information  Last PT Received On: 07/21/13 Assistance Needed: +1 ( ) History of Present Illness: ANEURYSM ABDOMINAL AORTIC REPAIR & RIGHT COMMON ILIAC ARTERY ANEURYSM REPAIR (N/A) as a surgical intervention     Subjective Data  Patient Stated Goal: back home and able to do for myself   Cognition  Cognition Arousal/Alertness: Awake/alert Behavior During Therapy: WFL for tasks assessed/performed Overall Cognitive Status: Within Functional Limits for tasks assessed    Balance  Balance Overall balance assessment: Needs assistance Sitting-balance support: No upper extremity supported Sitting balance-Leahy Scale: Good Standing balance support: Bilateral upper extremity supported;During functional activity Standing balance-Leahy Scale: Good  End of Session PT - End of Session Equipment Utilized During Treatment: Oxygen Activity Tolerance: Patient limited by fatigue;Patient tolerated treatment well Patient left: in bed;with call bell/phone within reach Nurse Communication: Mobility status   GP     Loyal Holzheimer, [name] 07/21/2013, 2:36 PM 07/21/2013  Donnella Sham, [place] [phone]  (pager)

## 2013-07-21 NOTE — Progress Notes (Signed)
Clinical Social Work Department CLINICAL SOCIAL WORK PLACEMENT NOTE 07/21/2013  Patient:  [name], [name]  Account Number:  [id number] Admit date:  07/10/2013  Clinical Social Worker:  Megan Salon  Date/time:  07/21/2013 03:36 PM  Clinical Social Work is seeking post-discharge placement for this patient at the following level of care:   [place]   (*CSW will update this form in Epic as items are completed)   07/21/2013  Patient/family provided with [place] Department of Clinical Social Work's list of facilities offering this level of care within the geographic area requested by the patient (or if unable, by the patient's family).  07/21/2013  Patient/family informed of their freedom to choose among providers that offer the needed level of care, that participate in Medicare, Medicaid or managed care program needed by the patient, have an available bed and are willing to accept the patient.  07/21/2013  Patient/family informed of [facility]' ownership interest in [facility], as well as of the fact that they are under no obligation to receive care at this facility.  PASARR submitted to EDS on 07/21/2013 PASARR number received from EDS on 07/21/2013  FL2 transmitted to all facilities in geographic area requested by pt/family on  07/21/2013 FL2 transmitted to all facilities within larger geographic area on   Patient informed that his/her managed care company has contracts with or will negotiate with  certain facilities, including the following:     Patient/family informed of bed offers received:   Patient chooses bed at  Physician recommends and patient chooses bed at    Patient to be transferred to  on   Patient to be transferred to facility by   The following physician request were entered in Epic:   Additional Comments:  Jeanette Caprice, MSW, [place]

## 2013-07-21 NOTE — Progress Notes (Addendum)
[facility] for Infectious Disease    Date of Admission:  07/10/2013   Total days of antibiotics 11        Day 6 aztreonam                  ID: [name] is a 73 y.o. male  with recent AAA repair complicated by [facility] requiring dialysis, Respiratory failure requiring vent support, and fevers, being treated for PsA pneumonia and VRE UTI but developed drug rash on imipenem.  Active Problems:   AAA (abdominal aortic aneurysm)   Pseudomonas pneumonia   VRE (vancomycin resistant enterococcus) culture positive    Subjective: Remains afebrile, ambulated yesterday, feeling better  Medications:  . antiseptic oral rinse  15 mL Mouth Rinse QID  . arformoterol  15 mcg Nebulization BID  . aspirin  324 mg Per NG tube Daily  . aztreonam  500 mg Intravenous Q8H  . bisacodyl  10 mg Rectal Daily  . budesonide (PULMICORT) nebulizer solution  0.5 mg Nebulization BID  . chlorhexidine  15 mL Mouth Rinse BID  . feeding supplement (PRO-STAT SUGAR FREE 64)  30 mL Per Tube Q1400  . heparin subcutaneous  5,000 Units Subcutaneous Q8H  . insulin aspart  0-15 Units Subcutaneous Q4H  . pantoprazole (PROTONIX) IV  40 mg Intravenous QHS  . polyvinyl alcohol  2 drop Both Eyes BID    Objective: Vital signs in last 24 hours: Temp:  [97.4 F (36.3 C)-98.8 F (37.1 C)] 98.8 F (37.1 C) (01/19 0431) Pulse Rate:  [95-102] 97 (01/19 0431) Resp:  [18-22] 18 (01/19 0431) BP: (110-127)/(57-71) 113/61 mmHg (01/19 0431) SpO2:  [92 %-100 %] 97 % (01/19 0431) General: older than stated age, fatigued appearing Neuro: Moves all 4s spontaneously  HEENT: dry oral mucosa  Cardiovascular: s1 s2, rrr, no g/m/r  Lungs: coarse lung sounds throughout anterior lung fields  Abdomen: soft, wound site clean along midline staples, distended ,left upper thigh bandaged Musculoskeletal: 2+ edema  Skin: scattered echymosis     Lab Results  Recent Labs  07/20/13 0451 07/21/13 0335  WBC 10.1 9.4  HGB 8.4* 8.4*    HCT 24.5* 24.8*  NA 139 139  K 3.7 4.0  CL 96 96  CO2 20 19  BUN 144* 150*  CREATININE 8.16* 7.43*   Liver Panel No results found for this basename: PROT, ALBUMIN, AST, ALT, ALKPHOS, BILITOT, BILIDIR, IBILI,  in the last 72 hours  Microbiology: 1/10 sputum; PsA ( pansensitive), aztreonam S  1/9 urine: VRE but amp S, 30K  Studies/Results: No results found.   Assessment/Plan: 73yo M with recent AAA repair complicated by [facility] requiring dialysis, Respiratory failure requiring vent support, and fevers, being treated for PsA pneumonia and VRE UTI but developed drug rash on imipenem.   PsA pneumonia:  - can continue with aztreonam since isolate is sensitive. Currently on day 9 of 14 of pseudomonal coverage. Would treat for addn 5 days to finish 14 day course with January 24th being the last day of antibiotics. If ready for discharge can switch the remaining course to oral moxifloxacin 400mg  po daily.  vre colonization of urine = no need to treat  aki on ckd = creatinine improving with post atn diuresis. defer to nephrology for continued management  Drug rash: resolved  Will sign off  [name] for Infectious Diseases [place], [facility] Cell: [phone] Pager: [phone]  07/21/2013,  11:25 AM

## 2013-07-21 NOTE — Progress Notes (Signed)
Clinical Social Work Department BRIEF PSYCHOSOCIAL ASSESSMENT 07/21/2013  Patient:  [name], [name]     Account Number:  [id number]     Admit date:  07/10/2013  Clinical Social Worker:  Megan Salon  Date/Time:  07/21/2013 03:30 PM  Referred by:  Physician  Date Referred:  07/21/2013 Referred for  SNF Placement   Other Referral:   Interview type:  Patient Other interview type:    PSYCHOSOCIAL DATA Living Status:  FAMILY Admitted from facility:   Level of care:   Primary support name:  [name] Primary support relationship to patient:  SPOUSE Degree of support available:   Good, patient states he lives with his wife and daughter    CURRENT CONCERNS Current Concerns  Post-Acute Placement   Other Concerns:    SOCIAL WORK ASSESSMENT / PLAN Clinical Social Worker received referral for SNF placement at d/c. CSW introduced self and explained reason for visit. CSW explained SNF process to patient, patient stated he is familiar with this process and is open to snf. Patient reported he is agreeable for SNF placement and has been to [facility] in [place] two times before. CSW asked patient if he had support at home and patient stated he has support at home from his wife and daughter. CSW encouraged patient to think about additional SNF options pending availability of preferred facility. CSW will complete FL2 for MD's signature and will update patient when bed offers are received.   Assessment/plan status:  Psychosocial Support/Ongoing Assessment of Needs Other assessment/ plan:   Information/referral to community resources:   SNF information    PATIENT'S/FAMILY'S RESPONSE TO PLAN OF CARE: Patient states he is open to SNF and has been to one before and thought it fine.       Jeanette Caprice, MSW, [place]

## 2013-07-21 NOTE — Progress Notes (Signed)
Patient ID: [name], male   DOB: 09-Jun-1941, 73 y.o.   MRN: [id number] [facility]  Patient name: [name] Medical record number: [id number] Date of birth: [birth year] Age: 73 y.o. Gender: male  Primary Care Provider: [name], MD  Subjective:   No complaints this AM, appetite has returned.  LE has improved  Objective: Temp:  [97.2 F (36.2 C)-98.8 F (37.1 C)] 98.8 F (37.1 C) (01/19 0431) Pulse Rate:  [95-104] 97 (01/19 0431) Resp:  [18-28] 18 (01/19 0431) BP: (108-127)/(57-71) 113/61 mmHg (01/19 0431) SpO2:  [92 %-100 %] 97 % (01/19 0431)  Intake/Output Summary (Last 24 hours) at 07/21/13 0810 Last data filed at 07/21/13 0432  Gross per 24 hour  Intake    420 ml  Output   3700 ml  Net  -3280 ml   Physical Exam: General: well appearing, NAD.  Cardiovascular: RRR. Gr 3/6 SEM Respiratory: scattered wheezes, no appreciated crackles  Abdomen: Soft. Slightly tender to palpation. Mildly distended.  Incision healing well. Liver down 5 cm Extremities: Trace LE edema  2+edema Neuro: No focal deficits.  Laboratory:  Recent Labs Lab 07/19/13 0500 07/20/13 0451 07/21/13 0335  WBC 11.0* 10.1 9.4  HGB 7.9* 8.4* 8.4*  HCT 23.9* 24.5* 24.8*  PLT 213 216 229    Recent Labs Lab 07/15/13 0500  07/19/13 0500 07/20/13 0451 07/21/13 0335  NA 136*  < > 138 139 139  K 4.0  < > 3.7 3.7 4.0  CL 99  < > 97 96 96  CO2 23  < > 21 20 19   BUN 54*  < > 139* 144* 150*  CREATININE 3.81*  < > 8.10* 8.16* 7.43*  CALCIUM 8.0*  < > 8.1* 8.2* 8.2*  PROT 5.4*  --   --   --   --   BILITOT 1.0  --   --   --   --   ALKPHOS 132*  --   --   --   --   ALT 24  --   --   --   --   AST 30  --   --   --   --   GLUCOSE 125*  < > 107* 135* 107*  < > = values in this interval not displayed.  Imaging/Diagnostic Tests: No results found.  Assessment:  1.) POD #11 s/p AAA repair 2.) AKI on CKD s/p AAA repair - acute injury secondary to hypotension, cross clamping of  renal arteries (ischemic insults); FENa 3 suggests ATN. Baseline Cr ~1.5 in the past 3rd episode AKI.  Improving and nonoliguric 3.) CHF: Fluid status stable, holding off further diuresis today  4.) DM  5.) CAD 6.) VRE UTI:  Transitioned to Aztreonam  7.) Pseudomonas Resp culture: sensitive to imipenem  8 COPD 9 OSA 10 PVD   Plan:  - Close BP monitoring,  per CCM / surgery.  - UOP continues to improve (3.8 L yesterday) - Creatinine improving from post-ATN diuresis, will hold off further diuresis at this point   Nolon Rod, DO 07/21/2013, 8:04 AM I have seen and examined this patient and agree with the plan of care seen, eval, examined, discussed and counseled. Known well to me  Calton Harshfield L 07/21/2013, 1:02 PM  PGY-2

## 2013-07-21 NOTE — Progress Notes (Addendum)
[facility] Progress Note  07/21/2013 7:47 AM 11 Days Post-Op  Subjective:  Feeling better this am.  "I'm passing wind this morning" "I walked 250 ft yesterday"  Afebrile VSS Now on 3LO2NC (CPAP last pm)  Filed Vitals:   07/21/13 0431  BP: 113/61  Pulse: 97  Temp: 98.8 F (37.1 C)  Resp: 18    Physical Exam: Cardiac:  regular Lungs:  Non labored Abdomen:  Less distended today; +flatus Incisions:  Laparotomy incision is c/d/i Extremities:  + biphasic PT doppler signal bilateally  CBC    Component Value Date/Time   WBC 9.4 07/21/2013 0335   RBC 2.84* 07/21/2013 0335   HGB 8.4* 07/21/2013 0335   HCT 24.8* 07/21/2013 0335   PLT 229 07/21/2013 0335   MCV 87.3 07/21/2013 0335   MCH 29.6 07/21/2013 0335   MCHC 33.9 07/21/2013 0335   RDW 15.9* 07/21/2013 0335   LYMPHSABS 0.8 07/21/2013 0335   MONOABS 0.4 07/21/2013 0335   EOSABS 0.3 07/21/2013 0335   BASOSABS 0.1 07/21/2013 0335    BMET    Component Value Date/Time   NA 139 07/21/2013 0335   K 4.0 07/21/2013 0335   CL 96 07/21/2013 0335   CO2 19 07/21/2013 0335   GLUCOSE 107* 07/21/2013 0335   BUN 150* 07/21/2013 0335   CREATININE 7.43* 07/21/2013 0335   CREATININE 4.17* 01/02/2013 1618   CALCIUM 8.2* 07/21/2013 0335   GFRNONAA 7* 07/21/2013 0335   GFRAA 8* 07/21/2013 0335    INR    Component Value Date/Time   INR 1.34 07/10/2013 1431     Intake/Output Summary (Last 24 hours) at 07/21/13 0747 Last data filed at 07/21/13 0432  Gross per 24 hour  Intake    430 ml  Output   3800 ml  Net  -3370 ml     Assessment/Plan:  73 y.o. 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  Foley is left in to keep strict UOP. -continue mobilization-ambulated in  halls yesterday 250 ft. -eating well without difficulty; + flatus -PNA-continue ABx -anemia also stable   Leontine Locket, PA-C [facility] [phone] 07/21/2013 7:47 AM  Agree with above assessment Abdomen relatively soft with nice healing of the midline incision and right inguinal wound. 2+ posterior tibial pulses palpable bilaterally. Patient alert and oriented ambulating in halls independently. Respiratory status is improving daily Currently having excellent urinary output-in the diuretic phase of ATN  with improving creatinine-potassium 4.0-3700 cc of urine output yesterday  Plan continue ambulation and IV antibiotics Hope to DC patient home later this week We'll need to  check with PCCM 2 determine if IV antibiotics will be necessary following discharge-if so will need a PICC line Also will need to no-when renal feels comfortable with patient being discharged home Patient making good progress

## 2013-07-22 LAB — CBC WITH DIFFERENTIAL/PLATELET
Basophils Absolute: 0 10*3/uL (ref 0.0–0.1)
Basophils Relative: 0 % (ref 0–1)
EOS ABS: 0.2 10*3/uL (ref 0.0–0.7)
EOS PCT: 2 % (ref 0–5)
HEMATOCRIT: 23.4 % — AB (ref 39.0–52.0)
Hemoglobin: 7.8 g/dL — ABNORMAL LOW (ref 13.0–17.0)
Lymphocytes Relative: 6 % — ABNORMAL LOW (ref 12–46)
Lymphs Abs: 0.6 10*3/uL — ABNORMAL LOW (ref 0.7–4.0)
MCH: 29.7 pg (ref 26.0–34.0)
MCHC: 33.3 g/dL (ref 30.0–36.0)
MCV: 89 fL (ref 78.0–100.0)
MONO ABS: 0.4 10*3/uL (ref 0.1–1.0)
Monocytes Relative: 4 % (ref 3–12)
Neutro Abs: 9.1 10*3/uL — ABNORMAL HIGH (ref 1.7–7.7)
Neutrophils Relative %: 88 % — ABNORMAL HIGH (ref 43–77)
Platelets: 200 10*3/uL (ref 150–400)
RBC: 2.63 MIL/uL — ABNORMAL LOW (ref 4.22–5.81)
RDW: 15.7 % — AB (ref 11.5–15.5)
WBC: 10.3 10*3/uL (ref 4.0–10.5)

## 2013-07-22 LAB — BASIC METABOLIC PANEL
BUN: 141 mg/dL — ABNORMAL HIGH (ref 6–23)
CALCIUM: 8.1 mg/dL — AB (ref 8.4–10.5)
CO2: 23 mEq/L (ref 19–32)
CREATININE: 6.66 mg/dL — AB (ref 0.50–1.35)
Chloride: 98 mEq/L (ref 96–112)
GFR calc Af Amer: 9 mL/min — ABNORMAL LOW (ref >90)
GFR, EST NON AFRICAN AMERICAN: 7 mL/min — AB (ref >90)
Glucose, Bld: 123 mg/dL — ABNORMAL HIGH (ref 70–99)
Potassium: 3.1 mEq/L — ABNORMAL LOW (ref 3.7–5.3)
Sodium: 141 mEq/L (ref 137–147)

## 2013-07-22 LAB — TYPE AND SCREEN
ABO/RH(D): O POS
Antibody Screen: NEGATIVE
UNIT DIVISION: 0
Unit division: 0

## 2013-07-22 LAB — GLUCOSE, CAPILLARY
GLUCOSE-CAPILLARY: 120 mg/dL — AB (ref 70–99)
GLUCOSE-CAPILLARY: 156 mg/dL — AB (ref 70–99)
Glucose-Capillary: 170 mg/dL — ABNORMAL HIGH (ref 70–99)
Glucose-Capillary: 95 mg/dL (ref 70–99)

## 2013-07-22 MED ORDER — POTASSIUM CHLORIDE CRYS ER 20 MEQ PO TBCR
40.0000 meq | EXTENDED_RELEASE_TABLET | Freq: Once | ORAL | Status: AC
  Administered 2013-07-22: 40 meq via ORAL
  Filled 2013-07-22: qty 2

## 2013-07-22 NOTE — Progress Notes (Signed)
Patient ID: [name], male   DOB: 1941/06/06, 73 y.o.   MRN: [id number] Vascular Surgery Progress Note  Subjective: 12 days post resection and grafting of peri-renal abdominal aortic aneurysm and right common iliac artery aneurysm with insertion aorto to right common femoral and left common iliac bypass-patient had postop Pseudomonas pneumonia. Now feeling much better. Ambulating independently some. Good appetite. Having daily bowel movements. Urine output improving. Remains on nasal oxygen but breathing comfortably according to patient.  Objective:  Filed Vitals:   07/22/13 0608  BP: 106/59  Pulse: 97  Temp: 97.4 F (36.3 C)  Resp: 18    General alert and oriented x3 in no apparent distress Lungs bilateral rhonchi Abdomen soft nontender with well healing midline incision Lower extremities well-perfused with 2+ posterior tibial pulses palpable. Right inguinal wound healing satisfactorily   Labs:  Recent Labs Lab 07/20/13 0451 07/21/13 0335 07/22/13 0515  CREATININE 8.16* 7.43* 6.66*    Recent Labs Lab 07/20/13 0451 07/21/13 0335 07/22/13 0515  NA 139 139 141  K 3.7 4.0 3.1*  CL 96 96 98  CO2 20 19 23   BUN 144* 150* 141*  CREATININE 8.16* 7.43* 6.66*  GLUCOSE 135* 107* 123*  CALCIUM 8.2* 8.2* 8.1*    Recent Labs Lab 07/20/13 0451 07/21/13 0335 07/22/13 0515  WBC 10.1 9.4 10.3  HGB 8.4* 8.4* 7.8*  HCT 24.5* 24.8* 23.4*  PLT 216 229 200   No results found for this basename: INR,  in the last 168 hours  I/O last 3 completed shifts: In: 980 [P.O.:830; IV Piggyback:150] Out: [id number] [Urine:4325; Stool:2]  Imaging: No results found.  Assessment/Plan:  POD #12   LOS: 12 days  s/p Procedure(s): Resection and Graftiong of perirenal AAA; Insertion 14 x 8 Hemashield Graft Aorta to Left Common Iliac and to Right Common Femoral Artery With Ligastion of Right External and Interanl Iliac Artery  Patient making excellent progress Renal function is improving  daily with creatinine today 6.6 down from 8.22 days ago-urine output 3000 cc -24-hours Pulmonary function continues to improve-patient on nasal oxygen with good O2 sats-needs 4 more days of antibiotics for pseudomonas pneumonia Hematologic --hematocrit 23.5% today which has been stable over past 5-6 days-platelets 200 k Vascular-excellent circulation the lower trimming these with adequately healing incisions GI-good return of bowel function and appetite  Patient and wife have expressed desire to go to [facility] in [place] If okay with renal-could possibly discharge to nursing facility tomorrow and we'll continue oral antibiotics through January 24 as per infectious disease Plan DC Foley catheter today and if discharged tomorrow can DC skin staples prior to discharge in a.m.  Tinnie Gens, MD 07/22/2013 8:59 AM

## 2013-07-22 NOTE — Progress Notes (Signed)
Foley Discontinued Per MD order and protocol. Pt instructed to void in urinal when he has the urge. Will continue to monitor.

## 2013-07-22 NOTE — Progress Notes (Signed)
Per RN, patient's wife wanted to speak to Education officer, museum. CSW spoke to patient's wife over the phone to explain the SNF process and that PT recommends a short term rehab. Patient's wife was very pleasant and was just wondering if patient would be able to get stronger while in the hospital, to be able to go home. CSW stated she was not sure and that physical therapy would work with patient again and encouraged wife to voice concerns. CSW also explained that social worker is waiting on [facility] to inform of bed situation. Wife understanding and will speak to Education officer, museum tomorrow.   Jeanette Caprice, MSW, [place]

## 2013-07-22 NOTE — Progress Notes (Signed)
CSW called [facility] in [place] to see if they are able to take patient when medically ready. [facility] stated they are waiting on a nurse to review the clinicals and they will call social worker back asap.  Jeanette Caprice, MSW, [place]

## 2013-07-22 NOTE — Progress Notes (Signed)
Patient ID: [name], male   DOB: 03/04/1941, 73 y.o.   MRN: [id number] [facility]  Patient name: [name] Medical record number: [id number] Date of birth: 06-Jul-1940 Age: 73 y.o. Gender: male  Primary Care Provider: [name], MD  Subjective:   Continues to improve, no complaints today, has been ambulating however SOB with transfers  Objective: Temp:  [97.4 F (36.3 C)-98.7 F (37.1 C)] 97.4 F (36.3 C) (01/20 0608) Pulse Rate:  [87-97] 97 (01/20 0608) Resp:  [16-18] 18 (01/20 0608) BP: (106-116)/(59-72) 106/59 mmHg (01/20 0608) SpO2:  [96 %-100 %] 97 % (01/20 [id number])  Intake/Output Summary (Last 24 hours) at 07/22/13 0757 Last data filed at 07/22/13 0326  Gross per 24 hour  Intake    980 ml  Output   2927 ml  Net  -1947 ml   Physical Exam: General: well appearing, NAD.  Cardiovascular: RRR. SEM 3/6 RUSB Respiratory: scattered wheezes, no appreciated crackles  Abdomen: Soft. Slightly tender to palpation. Mildly distended.  Incision healing well. Liver down 5 cm Extremities: Trace LE edema Neuro: No focal deficits.  Laboratory:  Recent Labs Lab 07/20/13 0451 07/21/13 0335 07/22/13 0515  WBC 10.1 9.4 10.3  HGB 8.4* 8.4* 7.8*  HCT 24.5* 24.8* 23.4*  PLT 216 229 200    Recent Labs Lab 07/20/13 0451 07/21/13 0335 07/22/13 0515  NA 139 139 141  K 3.7 4.0 3.1*  CL 96 96 98  CO2 20 19 23   BUN 144* 150* 141*  CREATININE 8.16* 7.43* 6.66*  CALCIUM 8.2* 8.2* 8.1*  GLUCOSE 135* 107* 123*    Imaging/Diagnostic Tests: No results found.  Assessment:  1.) POD #12 s/p AAA repair 2.) AKI on CKD s/p AAA repair - acute injury secondary to hypotension, cross clamping of renal arteries (ischemic insults); FENa 3 suggests ATN. Baseline Cr ~1.5 in the past 3rd episode AKI - Continues to have good UOP, improving  3.) CHF: Fluid status stable, holding further diuresis Very low GFR, slow recovery, vol xs, severe weakness. Would keep in hospital  until at least Fri and only D/C if improbing 4.) DM  5.) CAD 6.) VRE UTI:  Transitioned to Aztreonam  7.) Pseudomonas Resp culture: sensitive to imipenem  8 COPD 9 OSA 10 PVD   Plan:  - Close BP monitoring,  per CCM / surgery.  - UOP stable (2.9 L yesterday) - Creatinine improving daily from post-ATN diuresis - Recommend pt be monitored as inpt status for the next 1-2 days minimum to follow LE edema and renal function   Nolon Rod, DO 07/22/2013, 7:57 AM  PGY-2  I have seen and examined this patient and agree with the plan of care seen, eval, examined and discussed with residents, patient , and family. .  Danniell Rotundo L 07/22/2013, 12:05 PM

## 2013-07-22 NOTE — Progress Notes (Signed)
NUTRITION FOLLOW UP  Intervention:   No nutrition intervention warranted at this time RD to continue to follow  Nutrition Dx:   Inadequate oral intake, resolved  Goal:   Pt to meet >/= 90% of their estimated nutrition needs, met  Monitor:   PO intake, weight, labs, I/O's  Assessment:   Patient with PMH of OSA, CAD, prior CABG and AVR; presented for surgery with diagnosis of AAA.   Patient s/p procedure 1/8:  RESECTION AND GRAFTING OF PERIRENAL AAA  Patient transferred to 2W-Cardiac from 2S-Surgical 1/18.  S/p bedside swallow evaluation 1/16.  Patient with functional swallow s/p extubation.  Advanced to Renal 80/90-08-04-1198 ml diet 1/17.  Bowel function returned.  Patient's appetite good.  PO intake mostly 75-100% per flowsheet records.  Height: Ht Readings from Last 1 Encounters:  07/10/13 5' 5.5" (1.664 m)    Weight Status:   Wt Readings from Last 1 Encounters:  07/20/13 228 lb 8 oz (103.647 kg)    Re-estimated needs:  Kcal: 2000-2200 Protein: 100-110 gm Fluid: 2.1-2.3 L  Skin: deep tissue injury to coccyx  Diet Order: Renal 80/90-08-04-1198 ml   Intake/Output Summary (Last 24 hours) at 07/22/13 0954 Last data filed at 07/22/13 0326  Gross per 24 hour  Intake    780 ml  Output   2927 ml  Net  -2147 ml    Labs:   Recent Labs Lab 07/18/13 0340 07/19/13 0500 07/20/13 0451 07/21/13 0335 07/22/13 0515  NA 138 138 139 139 141  K 3.9 3.7 3.7 4.0 3.1*  CL 98 97 96 96 98  CO2 21 21 20 19 23   BUN 120* 139* 144* 150* 141*  CREATININE 7.71* 8.10* 8.16* 7.43* 6.66*  CALCIUM 8.3* 8.1* 8.2* 8.2* 8.1*  MG 2.8* 2.8* 2.6* 2.4  --   PHOS 7.6* 8.6* 8.2*  --   --   GLUCOSE 105* 107* 135* 107* 123*    CBG (last 3)   Recent Labs  07/21/13 1605 07/21/13 2108 07/22/13 0603  GLUCAP 103* 155* 120*    Scheduled Meds: . antiseptic oral rinse  15 mL Mouth Rinse QID  . arformoterol  15 mcg Nebulization BID  . aspirin  324 mg Per NG tube Daily  . aztreonam  500  mg Intravenous Q8H  . bisacodyl  10 mg Rectal Daily  . budesonide (PULMICORT) nebulizer solution  0.5 mg Nebulization BID  . chlorhexidine  15 mL Mouth Rinse BID  . heparin subcutaneous  5,000 Units Subcutaneous Q8H  . insulin aspart  0-15 Units Subcutaneous TID WC  . pantoprazole sodium  40 mg Per Tube QHS  . polyvinyl alcohol  2 drop Both Eyes BID  . potassium chloride  40 mEq Oral Once    Continuous Infusions: . sodium chloride Stopped (07/20/13 1100)    Arthur Holms, RD, LDN Pager #: [phone] After-Hours Pager #: [phone]

## 2013-07-23 LAB — IRON AND TIBC
Iron: 12 ug/dL — ABNORMAL LOW (ref 42–135)
Saturation Ratios: 7 % — ABNORMAL LOW (ref 20–55)
TIBC: 176 ug/dL — ABNORMAL LOW (ref 215–435)
UIBC: 164 ug/dL (ref 125–400)

## 2013-07-23 LAB — RENAL FUNCTION PANEL
Albumin: 2.2 g/dL — ABNORMAL LOW (ref 3.5–5.2)
BUN: 122 mg/dL — AB (ref 6–23)
CO2: 20 meq/L (ref 19–32)
CREATININE: 5.27 mg/dL — AB (ref 0.50–1.35)
Calcium: 8.1 mg/dL — ABNORMAL LOW (ref 8.4–10.5)
Chloride: 102 mEq/L (ref 96–112)
GFR calc Af Amer: 11 mL/min — ABNORMAL LOW (ref >90)
GFR calc non Af Amer: 10 mL/min — ABNORMAL LOW (ref >90)
Glucose, Bld: 131 mg/dL — ABNORMAL HIGH (ref 70–99)
Phosphorus: 5.8 mg/dL — ABNORMAL HIGH (ref 2.3–4.6)
Potassium: 3.6 mEq/L — ABNORMAL LOW (ref 3.7–5.3)
Sodium: 142 mEq/L (ref 137–147)

## 2013-07-23 LAB — GLUCOSE, CAPILLARY
GLUCOSE-CAPILLARY: 122 mg/dL — AB (ref 70–99)
Glucose-Capillary: 148 mg/dL — ABNORMAL HIGH (ref 70–99)
Glucose-Capillary: 180 mg/dL — ABNORMAL HIGH (ref 70–99)
Glucose-Capillary: 140 mg/dL — ABNORMAL HIGH (ref 70–99)

## 2013-07-23 NOTE — Progress Notes (Signed)
Attempted to ambulate the patient a second time but he became dizzy and SOB. Returned to the room and symptoms subsided.

## 2013-07-23 NOTE — Progress Notes (Signed)
Patient ID: [name], male   DOB: [birth year], 73 y.o.   MRN: [id number] Seen, examined, evaluated and discussed with resident , Dr. Awanda Mink. Also discussed with Dr. Kellie Simmering.  Patient counseled.

## 2013-07-23 NOTE — Progress Notes (Signed)
Patient ID: [name], male   DOB: [birth year], 73 y.o.   MRN: [id number] Vascular Surgery Progress Note  Subjective: Continues to slowly improve. Not ambulating as much as he would like. Denies any nausea or vomiting. Having bowel movements daily. Continues to have adequate urinary output. Foley DC'd yesterday now with condom catheter. Denies pain or numbness in lower extremities. Objective:  Filed Vitals:   07/23/13 0555  BP: 129/70  Pulse: 100  Temp: 99.4 F (37.4 C)  Resp: 18    General alert and oriented x3 in no apparent distress Lungs bilateral rhonchi Abdomen soft nontender well-healed midline incision Right inguinal wound continues to heal satisfactorily with 2+ posterior tibial pulses bilaterally palpable    Labs:  Recent Labs Lab 07/21/13 0335 07/22/13 0515 07/23/13 0510  CREATININE 7.43* 6.66* 5.27*    Recent Labs Lab 07/21/13 0335 07/22/13 0515 07/23/13 0510  NA 139 141 142  K 4.0 3.1* 3.6*  CL 96 98 102  CO2 19 23 20   BUN 150* 141* 122*  CREATININE 7.43* 6.66* 5.27*  GLUCOSE 107* 123* 131*  CALCIUM 8.2* 8.1* 8.1*    Recent Labs Lab 07/20/13 0451 07/21/13 0335 07/22/13 0515  WBC 10.1 9.4 10.3  HGB 8.4* 8.4* 7.8*  HCT 24.5* 24.8* 23.4*  PLT 216 229 200   No results found for this basename: INR,  in the last 168 hours  I/O last 3 completed shifts: In: 600 [P.O.:600] Out: 2501 [Urine:2500; Stool:1]  Imaging: No results found.  Assessment/Plan:  POD #13  LOS: 13 days  s/p Procedure(s): Resection and Graftiong of perirenal AAA; Insertion 14 x 8 Hemashield Graft Aorta to Left Common Iliac and to Right Common Femoral Artery With Ligastion of Right External and Interanl Iliac Artery  Discuss situation with Dr. Jimmy Footman who feels that patient should remain in the hospital at least until Friday to watch renal function improvement He did continue to ambulate in the halls at least twice daily-discussed with nursing staff Urine output is  stable now off of diuretics with 1200 cc made yesterday Continue IV antibiotics until January 24  To DC by Friday or the weekend to either home or clap nursing facility   Tinnie Gens, MD 07/23/2013 10:35 AM

## 2013-07-23 NOTE — Progress Notes (Signed)
Patient ID: [name], male   DOB: Mar 31, 1941, 73 y.o.   MRN: [id number] [facility]  Patient name: [name] Medical record number: [id number] Date of birth: [birth year] Age: 73 y.o. Gender: male  Primary Care Provider: [name], MD  Subjective:   Pt with minimal SOB, only with transfer or prolonged amublation  Objective: Temp:  [97.8 F (36.6 C)-99.4 F (37.4 C)] 99.4 F (37.4 C) (01/21 0555) Pulse Rate:  [91-107] 100 (01/21 0555) Resp:  [18-19] 18 (01/21 0555) BP: (128-132)/(70-76) 129/70 mmHg (01/21 0555) SpO2:  [91 %-98 %] 96 % (01/21 0555)  Intake/Output Summary (Last 24 hours) at 07/23/13 0734 Last data filed at 07/22/13 2157  Gross per 24 hour  Intake    600 ml  Output   1275 ml  Net   -675 ml   Physical Exam: General: well appearing, NAD.  Cardiovascular: RRR. SEM 3/6 RUSB Respiratory: scattered wheezes, no appreciated crackles  Abdomen: Soft. Slightly tender to palpation. Mildly distended.  Incision healing well. Liver down 5 cm Extremities: +1 LE edema B/L  Neuro: No focal deficits.  Laboratory:  Recent Labs Lab 07/20/13 0451 07/21/13 0335 07/22/13 0515  WBC 10.1 9.4 10.3  HGB 8.4* 8.4* 7.8*  HCT 24.5* 24.8* 23.4*  PLT 216 229 200    Recent Labs Lab 07/21/13 0335 07/22/13 0515 07/23/13 0510  NA 139 141 142  K 4.0 3.1* 3.6*  CL 96 98 102  CO2 19 23 20   BUN 150* 141* 122*  CREATININE 7.43* 6.66* 5.27*  CALCIUM 8.2* 8.1* 8.1*  GLUCOSE 107* 123* 131*    Imaging/Diagnostic Tests: No results found.  Assessment:  1.) POD #13 s/p AAA repair 2.) AKI on CKD s/p AAA repair - acute injury secondary to hypotension, cross clamping of renal arteries (ischemic insults); FENa 3 suggests ATN. Baseline Cr ~1.5 in the past 3rd episode AKI - Slowly improving renal function.  3.) CHF: Fluid status stable, holding further diuresis  4.) DM  5.) CAD 6.) VRE UTI:  Transitioned to Aztreonam  7.) Pseudomonas Resp culture: sensitive to  imipenem  8 COPD 9 OSA 10 PVD   Plan:  - BP stable, continue to monitor,  per CCM / surgery.  - UOP decreasing slightly, monitor very closely (1.2L yesterday) - Creatinine improving daily from post-ATN diuresis - Recommend inpt status for 1-2 more days to follow UOP, creatinine, edema  Nolon Rod, DO 07/23/2013, 7:34 AM

## 2013-07-23 NOTE — Progress Notes (Signed)
RN ambulated patient in the hall.

## 2013-07-23 NOTE — Progress Notes (Signed)
CSW updated patient and patient's wife that there is a bed at [facility] in [place]. Patient's wife asked social worker to speak to them in a little bit as they are going to talk about home vs. snf.  Jeanette Caprice, MSW, [place]

## 2013-07-24 LAB — GLUCOSE, CAPILLARY
GLUCOSE-CAPILLARY: 120 mg/dL — AB (ref 70–99)
Glucose-Capillary: 125 mg/dL — ABNORMAL HIGH (ref 70–99)
Glucose-Capillary: 173 mg/dL — ABNORMAL HIGH (ref 70–99)
Glucose-Capillary: 137 mg/dL — ABNORMAL HIGH (ref 70–99)

## 2013-07-24 LAB — RENAL FUNCTION PANEL
ALBUMIN: 2.4 g/dL — AB (ref 3.5–5.2)
BUN: 99 mg/dL — AB (ref 6–23)
CO2: 24 mEq/L (ref 19–32)
Calcium: 8.4 mg/dL (ref 8.4–10.5)
Chloride: 105 mEq/L (ref 96–112)
Creatinine, Ser: 4.25 mg/dL — ABNORMAL HIGH (ref 0.50–1.35)
GFR calc Af Amer: 15 mL/min — ABNORMAL LOW (ref >90)
GFR calc non Af Amer: 13 mL/min — ABNORMAL LOW (ref >90)
Glucose, Bld: 126 mg/dL — ABNORMAL HIGH (ref 70–99)
PHOSPHORUS: 5.2 mg/dL — AB (ref 2.3–4.6)
POTASSIUM: 3.2 meq/L — AB (ref 3.7–5.3)
Sodium: 145 mEq/L (ref 137–147)

## 2013-07-24 LAB — CBC
HCT: 23.7 % — ABNORMAL LOW (ref 39.0–52.0)
Hemoglobin: 7.9 g/dL — ABNORMAL LOW (ref 13.0–17.0)
MCH: 30 pg (ref 26.0–34.0)
MCHC: 33.3 g/dL (ref 30.0–36.0)
MCV: 90.1 fL (ref 78.0–100.0)
PLATELETS: 259 10*3/uL (ref 150–400)
RBC: 2.63 MIL/uL — ABNORMAL LOW (ref 4.22–5.81)
RDW: 16.4 % — ABNORMAL HIGH (ref 11.5–15.5)
WBC: 9.6 10*3/uL (ref 4.0–10.5)

## 2013-07-24 MED ORDER — DSS 100 MG PO CAPS: 100.0000 mg | ORAL_CAPSULE | Freq: Two times a day (BID) | ORAL | Status: DC | PRN

## 2013-07-24 MED ORDER — ARFORMOTEROL TARTRATE 15 MCG/2ML IN NEBU: 15.0000 ug | INHALATION_SOLUTION | Freq: Two times a day (BID) | RESPIRATORY_TRACT | Status: DC

## 2013-07-24 MED ORDER — SODIUM CHLORIDE 0.9 % IV SOLN
1020.0000 mg | Freq: Once | INTRAVENOUS | Status: AC
  Administered 2013-07-24: 1020 mg via INTRAVENOUS
  Filled 2013-07-24 (×2): qty 34

## 2013-07-24 MED ORDER — OXYCODONE-ACETAMINOPHEN 5-325 MG PO TABS: 1.0000 | ORAL_TABLET | Freq: Four times a day (QID) | ORAL | Status: DC | PRN

## 2013-07-24 MED ORDER — IPRATROPIUM BROMIDE 0.02 % IN SOLN: 0.5000 mg | RESPIRATORY_TRACT | Status: DC | PRN

## 2013-07-24 MED ORDER — ALBUTEROL SULFATE (2.5 MG/3ML) 0.083% IN NEBU: 2.5000 mg | INHALATION_SOLUTION | RESPIRATORY_TRACT | Status: DC | PRN

## 2013-07-24 MED ORDER — POTASSIUM CHLORIDE CRYS ER 20 MEQ PO TBCR
40.0000 meq | EXTENDED_RELEASE_TABLET | Freq: Two times a day (BID) | ORAL | Status: AC
  Administered 2013-07-24 (×2): 40 meq via ORAL
  Filled 2013-07-24 (×2): qty 2

## 2013-07-24 MED ORDER — BUDESONIDE 0.5 MG/2ML IN SUSP: 0.5000 mg | Freq: Two times a day (BID) | RESPIRATORY_TRACT | Status: DC

## 2013-07-24 NOTE — Progress Notes (Addendum)
Physical Therapy Treatment Patient Details Name: [name] MRN: [id number] DOB: [birth year] Today's Date: 07/24/2013 Time: 0950-1013 PT Time Calculation (min): 23 min  PT Assessment / Plan / Recommendation  History of Present Illness ANEURYSM ABDOMINAL AORTIC REPAIR & RIGHT COMMON ILIAC ARTERY ANEURYSM REPAIR (N/A) as a surgical intervention    PT Comments   Expect pt will stay a short time for rehab at snf before returning home at [place]. Level.  Maintained sats in low 90's on 2L [place]  Follow Up Recommendations  SNF     Does the patient have the potential to tolerate intense rehabilitation     Barriers to Discharge        Equipment Recommendations  Other (comment) (TBA next venue)    Recommendations for Other Services    Frequency Min 3X/week   Progress towards PT Goals Progress towards PT goals: Progressing toward goals  Plan Current plan remains appropriate    Precautions / Restrictions Precautions Precautions: Fall Restrictions Weight Bearing Restrictions: No   Pertinent Vitals/Pain Sats remain in 90's on 2L [place] though worked on pt feeling when his EHR too high and when to stop and rest.  EHR running between low of 110's to highs in low 130's    Mobility  Bed Mobility Overal bed mobility: Needs Assistance Transfers Overall transfer level: Needs assistance Equipment used: Rolling walker (2 wheeled) Transfers: Sit to/from Stand Sit to Stand: Supervision ( ) General transfer comment: cues for safety Ambulation/Gait Ambulation/Gait assistance: Supervision Ambulation Distance (Feet): 150 Feet (times 2 with 3 standing rest breaks) Assistive device: Rolling walker (2 wheeled) (pushing w/c) Gait Pattern/deviations: Step-through pattern Stairs: Yes Stair Management: One rail Right;Alternating pattern;Forwards Number of Stairs: 3 General stair comments: became tired on stairs and sat down on the stairs.    Exercises     PT Diagnosis:    PT Problem List:   PT  Treatment Interventions:     PT Goals (current goals can now be found in the care plan section) Acute Rehab PT Goals Patient Stated Goal: back home and able to do for myself PT Goal Formulation: With patient Time For Goal Achievement: 07/31/13 Potential to Achieve Goals: Good  Visit Information  Last PT Received On: 07/24/13 Assistance Needed: +1 ( ) History of Present Illness: ANEURYSM ABDOMINAL AORTIC REPAIR & RIGHT COMMON ILIAC ARTERY ANEURYSM REPAIR (N/A) as a surgical intervention     Subjective Data  Subjective: I'm going to get some further therapy before going home. Patient Stated Goal: back home and able to do for myself   Cognition  Cognition Arousal/Alertness: Awake/alert Behavior During Therapy: WFL for tasks assessed/performed Overall Cognitive Status: Within Functional Limits for tasks assessed    Balance  Balance Overall balance assessment: No apparent balance deficits (not formally assessed) Sitting balance-Leahy Scale: Good Standing balance-Leahy Scale: Good  End of Session PT - End of Session Equipment Utilized During Treatment: Oxygen Activity Tolerance: Patient tolerated treatment well Patient left: with call bell/phone within reach;Other (comment) (sitting EOB) Nurse Communication: Mobility status   GP     [name], [name] 07/24/2013, 12:48 PM

## 2013-07-24 NOTE — Care Management Utilization Note (Signed)
UR Completed.    Samuell Knoble Wise Chinaza Rooke, RN, BSN Phone #[phone] 

## 2013-07-24 NOTE — Progress Notes (Signed)
CSW has spoke to [facility] and has confirmed a bed for dc for tomorrow.  Jeanette Caprice, MSW, [place]

## 2013-07-24 NOTE — Progress Notes (Signed)
Patient not ready to go on CPAP at this time.  Patient wanted to eat his snack and use the restroom.  Told patient to let RN know when he was ready to put CPAP on and RT would come back to his room to place him on CPAP.

## 2013-07-24 NOTE — Progress Notes (Signed)
Patient ID: [name], male   DOB: 02-14-1941, 73 y.o.   MRN: [id number] [facility]  Patient name: [name] Medical record number: [id number] Date of birth: 1940-09-01 Age: 73 y.o. Gender: male  Primary Care Provider: [name], MD  Subjective:   Pt continues to have SOB, no abdominal pain   Objective: Temp:  [97.5 F (36.4 C)-98.8 F (37.1 C)] 98.8 F (37.1 C) (01/22 0433) Pulse Rate:  [91-101] 101 (01/22 0433) Resp:  [18] 18 (01/22 0433) BP: (115-129)/(61-68) 129/66 mmHg (01/22 0433) SpO2:  [96 %-97 %] 96 % (01/22 0433) Weight:  [221 lb 8 oz (100.472 kg)] 221 lb 8 oz (100.472 kg) (01/22 0444)  Intake/Output Summary (Last 24 hours) at 07/24/13 0802 Last data filed at 07/24/13 0600  Gross per 24 hour  Intake   1062 ml  Output    851 ml  Net    211 ml   Physical Exam: General: well appearing, NAD.  Cardiovascular: RRR. SEM 3/6 RUSB Respiratory: scattered wheezes, no appreciated crackles  Abdomen: Soft. Slightly tender to palpation. Mildly distended.  Incision healing well. Liver down 5 cm Extremities: +1 LE edema B/L  Neuro: No focal deficits.  Laboratory:  Recent Labs Lab 07/21/13 0335 07/22/13 0515 07/24/13 0500  WBC 9.4 10.3 9.6  HGB 8.4* 7.8* 7.9*  HCT 24.8* 23.4* 23.7*  PLT 229 200 259    Recent Labs Lab 07/22/13 0515 07/23/13 0510 07/24/13 0601  NA 141 142 145  K 3.1* 3.6* 3.2*  CL 98 102 105  CO2 23 20 24   BUN 141* 122* 99*  CREATININE 6.66* 5.27* 4.25*  CALCIUM 8.1* 8.1* 8.4  GLUCOSE 123* 131* 126*    Imaging/Diagnostic Tests: No results found.  Assessment:  1.) POD #14 s/p AAA repair 2.) AKI on CKD s/p AAA repair - acute injury secondary to hypotension, cross clamping of renal arteries (ischemic insults); FENa 3 suggests ATN. Baseline Cr ~1.5 in the past 3rd episode AKI - Slowly improving renal function. Doubt will get to baseline.  Steady improvment 3.) CHF: Fluid status stable, holding further diuresis  4.)  DM  5.) CAD 6.) VRE UTI:  Transitioned to Aztreonam  7.) Pseudomonas Resp culture: sensitive to imipenem  8 COPD 9 OSA 10 PVD   Plan:  - BP stable, continue to monitor,  per CCM / surgery.  - UOP continues to decrease (850 mL from 1.2 L )  - Creatinine improving daily from post-ATN diuresis, gfr improving too - Possible d/c tomorrow if creatinine and gfr trend down F/U otupatient  Nolon Rod, DO 07/24/2013, 8:02 AM I have seen and examined this patient and agree with the plan of care seen, examined, eval and discussed.   .  Yaden Seith L 07/24/2013, 11:19 AM

## 2013-07-24 NOTE — Progress Notes (Signed)
Patient gets sob when ambulating and requires 1 1/2-2 liters of oxygen

## 2013-07-24 NOTE — Discharge Summary (Signed)
[facility] AAA Discharge Summary  [name] Nov 09, 1940 73 y.o. male  [id number]  Admission Date: 07/10/2013  Discharge Date: 07/25/2013  Physician: Mal Misty, MD  Admission Diagnosis: AAA   HPI:   This is a 73 y.o. 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 He underwent coronary artery bypass grafting and pericardial aortic valve replacement by Dr. Roxan Hockey April 24 vein. He has been slowly recuperating and now is back to his preoperative strength level. He saw Dr. Loralie Champagne last week who agrees that he is a stable cardiac-wise as he will be he denies any abdominal or back pain. He is ambulating on a treadmill and walks up to a few blocks  Hospital Course:  The patient was admitted to the hospital and taken to the operating room on 07/10/2013 and underwent: Resection and Graftiong of perirenal AAA; Insertion 14 x 8 Hemashield Graft Aorta to Left Common Iliac and to Right Common Femoral Artery With Ligastion of Right External and Interanl Iliac Artery to treat large right common iliac artery aneurysm.  Dopamine had been in place throughout the entire procedure.  Patient was left on the ventilator and taken directly to the ICU in stable condition.  Intraoperatively, he received 2 units of FFP, 4 units of PRBC's, and 1500 cc blood from the cell saver.  He did have a 2D echo with the following results on 07/11/13: Impressions: - Normal LV size with mild LV hypertrophy. EF 55-60%. Mildly dilated RV with mildly decreased systolic function. D-shaped interventricular septum suggests RV pressure/volume overload. There is a poorly-visualized bioprosthetic aortic valve. The mean gradient across the valve is elevated to 32 mmHg. This is a bit higher than the last echo and may reflect a high flow state.  The pt did have an increase in the creatinine on POD 1 to 3.07 with UOP 10-15 cc/hr.   Patient had approximately 1 hour of renal ischemia time to deal with the aneurysm which extended up to and involved the origin of the left renal artery. Excellent Doppler flow in both renal arteries at conclusion of procedure. Patient does have history of ATN with previous pneumonia and cardiac surgery last year.  A renal consult was obtained.  While in the ICU, he was also place on Neo-Synephrine gtt.  These were eventually weaned successfully.  On POD 3, he was still requiring pressor support with decreasing UOP and was placed on CVVHD.  Also on POD 3, he did have purulent secretions and was febrile.  Culture revealed abundant Pseudomonas Aeruginosa and he was placed on the appropriate ABx.  He also grew enterococcus from urine.   Pt was also started on TF's.  On POD 6, these were discontinued due to projective vomiting.  His abdominal film revealed slightly dilated colon paucity of gas.  TPN was started to rest his gut for a few days.  On POD 6, pt developed a new rash.  He had just been started on primaxin and it was then discontinued.  He does have an allergy to cephalosporins with bullae and hives.  An ID consult was then obtained.  It was recommended use of prn hydroxyzine or benadryl be used as needed.   His rash did resolve.  Pt was extubated on POD 7 and tolerated well.  He did slowly continue to improve.  His UOP continued to increase and creatinine was also improving.  He did not require any further dialysis.  His foley catheter is removed day before discharge as it has remained in due to needing strict urine output.  His skin staples are removed prior to discharge.  He will follow up with Dr. Kellie Simmering in 2-3 weeks.  We want a CBC and BMET draw at [facility] the day prior to his visit for Dr. Kellie Simmering to check.  The remainder of the hospital course consisted of increasing mobilization and increasing intake of solids without difficulty.  CBC    Component Value Date/Time   WBC 9.6 07/24/2013  0500   RBC 2.63* 07/24/2013 0500   HGB 7.9* 07/24/2013 0500   HCT 23.7* 07/24/2013 0500   PLT 259 07/24/2013 0500   MCV 90.1 07/24/2013 0500   MCH 30.0 07/24/2013 0500   MCHC 33.3 07/24/2013 0500   RDW 16.4* 07/24/2013 0500   LYMPHSABS 0.6* 07/22/2013 0515   MONOABS 0.4 07/22/2013 0515   EOSABS 0.2 07/22/2013 0515   BASOSABS 0.0 07/22/2013 0515    BMET    Component Value Date/Time   NA 145 07/24/2013 0601   K 3.2* 07/24/2013 0601   CL 105 07/24/2013 0601   CO2 24 07/24/2013 0601   GLUCOSE 126* 07/24/2013 0601   BUN 99* 07/24/2013 0601   CREATININE 4.25* 07/24/2013 0601   CREATININE 4.17* 01/02/2013 1618   CALCIUM 8.4 07/24/2013 0601   GFRNONAA 13* 07/24/2013 0601   GFRAA 15* 07/24/2013 0601     Discharge Instructions:   The patient is discharged to home with extensive instructions on wound care and progressive ambulation.  They are instructed not to drive or perform any heavy lifting until returning to see the physician in his office.  Discharge Orders   Future Appointments Provider Department Dept Phone   08/11/2013 3:30 PM Larey Dresser, MD [place] Office [phone]   Future Orders Complete By Expires   Call MD for:  redness, tenderness, or signs of infection (pain, swelling, bleeding, redness, odor or green/yellow discharge around incision site)  As directed    Call MD for:  severe or increased pain, loss or decreased feeling  in affected limb(s)  As directed    Call MD for:  temperature >100.5  As directed    Discharge instructions  As directed    Comments:     Continue physical therapy and strength training. Continue increasing mobilization.   Discharge wound care:  As directed    Comments:     Shower daily with soap and water starting 07/25/13   Lifting restrictions  As directed    Comments:     No lifting for 4 weeks   Resume previous diet  As directed       Discharge Diagnosis:  AAA  Secondary Diagnosis: Patient Active Problem List   Diagnosis Date Noted   . Pseudomonas pneumonia 07/17/2013  . VRE (vancomycin resistant enterococcus) culture positive 07/17/2013  . AAA (abdominal aortic aneurysm) 07/10/2013  . OSA (obstructive sleep apnea) 06/17/2013  . Chronic systolic heart failure   . Ischemic cardiomyopathy   . Chronic diastolic heart failure [id number]  . AKI (acute kidney injury) 01/04/2013  . Hypotension 01/04/2013  . Peripheral vascular disease, unspecified 12/31/2012  . Acute-on-chronic respiratory failure 12/26/2012  . Anxiety 12/26/2012  . History of aortic valve replacement with bioprosthetic valve 12/26/2012  . Hypoxia 12/26/2012  . Type 2 diabetes mellitus 12/26/2012  . Acute renal insufficiency 12/26/2012  . CAD (coronary artery disease) 12/25/2012  . Hx of CABG 12/25/2012  . Orthostatic hypotension 12/25/2012  .  Acute on chronic diastolic CHF (congestive heart failure) 12/19/2012  . AAA (abdominal aortic aneurysm) without rupture 10/23/2012  . Aneurysm of right iliac artery 10/23/2012  . 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 COPD (chronic obstructive pulmonary disease) 10/23/2012  . Aortic stenosis 10/20/2012  . ARDS (adult respiratory distress syndrome) 07/20/2012   Past Medical History  Diagnosis Date  . Hypertension   . Hyperlipidemia   . Prediabetes   . CHF (congestive heart failure) 07/2012; 10/17/2012  . COPD (chronic obstructive pulmonary disease)     a.  History of heavy smoking. PFTs (4/14) with mixed obstructive (COPD) and restrictive (post-ARDS) picture.   . Pneumonia 07/2012    PNA (H1N1 influenza + pneumococcus) in [id number] complicated by respiratory failure/ARDS. Required tracheostomy, now weaned off. Had left empyema requiring chest tube.   . Diabetes mellitus without complication   . History of blood transfusion     "lots since January" (10/17/2012)  . Ischemic cardiomyopathy   . Anemia of chronic disease   . Aortic stenosis     Moderate to severe by echo in 4/14. Bioprosthetic #21 [facility] Ease aortic valve replacement in 4/14.   Marland Kitchen CAD (coronary artery disease)     a. 4/14 CABG: LIMA-LAD, seq SVG-ramus & OM1, SVG-PDA  . Carotid arterial disease     Carotid dopplers (0000000) with [id number] LICA stenosis.   Marland Kitchen AAA (abdominal aortic aneurysm)     5/14 CT showed > 6 cm AAA, also right iliac aneurysm. Not stent graft candidate.   . Esophageal reflux   . Ischemic cardiomyopathy     a. 4/14 LHC: pLAD 95, ost ramus 70-80, mLCx 90, EF 30%  . Chronic systolic heart failure     a. 1/14 ECHO: sev dil LV, EF30-35%, diff HK, mild MR, AS severe, AV grad 35 b. 8/14 ECHO: EF 55-60%, mild biopros AV sten mn grad. 25, RV mild dil, RA mild dil  . Complication of anesthesia     during last surgery had to be given special medicine b/c "something dropped" during surgery   . Anxiety     pt. admits that he has anxiety at times   . Shortness of breath     still goes deer hunting by himself  . OSA (obstructive sleep apnea)     on cpap- every sleep time.   . Chronic kidney disease     increased creatinine recently - being followed, near kidney failure fr. contrast dye   . Prostate cancer      s/p radiation treatment. - (PT. DENIES)Has indwelling foley.   . Arthritis        Medication List    STOP taking these medications       ipratropium-albuterol 0.5-2.5 (3) MG/3ML Soln  Commonly known as:  DUONEB     metolazone 2.5 MG tablet  Commonly known as:  ZAROXOLYN     potassium chloride 10 MEQ tablet  Commonly known as:  KLOR-CON 10     sitaGLIPtin 100 MG tablet  Commonly known as:  JANUVIA      TAKE these medications       albuterol (2.5 MG/3ML) 0.083% nebulizer solution  Commonly known as:  PROVENTIL  Take 3 mLs (2.5 mg total) by nebulization every 2 (two) hours as needed for wheezing or shortness of breath.     allopurinol 300 MG tablet  Commonly known as:  ZYLOPRIM  Take 300 mg by mouth daily.     arformoterol 15 MCG/2ML Nebu  Commonly known  as:  BROVANA  Take 2 mLs (15 mcg  total) by nebulization 2 (two) times daily.     aspirin 325 MG EC tablet  Take 1 tablet (325 mg total) by mouth daily.     atorvastatin 80 MG tablet  Commonly known as:  LIPITOR  Take 80 mg by mouth daily before breakfast.     bisoprolol 5 MG tablet  Commonly known as:  ZEBETA  Take 2.5 mg by mouth daily.     budesonide 0.25 MG/2ML nebulizer solution  Commonly known as:  PULMICORT  Take 2 mLs (0.25 mg total) by nebulization 2 (two) times daily. Dx 496     budesonide 0.5 MG/2ML nebulizer solution  Commonly known as:  PULMICORT  Take 2 mLs (0.5 mg total) by nebulization 2 (two) times daily.     clonazePAM 0.5 MG tablet  Commonly known as:  KLONOPIN  Take 1 tablet (0.5 mg total) by mouth 2 (two) times daily as needed (anxiety).     DSS 100 MG Caps  Take 100 mg by mouth 2 (two) times daily as needed for mild constipation.     furosemide 80 MG tablet  Commonly known as:  LASIX  Take 120 mg by mouth 2 (two) times daily.     ipratropium 0.02 % nebulizer solution  Commonly known as:  ATROVENT  Take 2.5 mLs (0.5 mg total) by nebulization every 2 (two) hours as needed for wheezing or shortness of breath.     mupirocin ointment 2 %  Commonly known as:  BACTROBAN  1 Application, nasally, 2 times daily for 5 days prior to surgery     nitroGLYCERIN 0.4 MG SL tablet  Commonly known as:  NITROSTAT  Place 1 tablet (0.4 mg total) under the tongue every 5 (five) minutes as needed for chest pain.     oxyCODONE-acetaminophen 5-325 MG per tablet  Commonly known as:  PERCOCET/ROXICET  Take 1-2 tablets by mouth every 6 (six) hours as needed for severe pain.     pantoprazole 40 MG tablet  Commonly known as:  PROTONIX  Take 40 mg by mouth at bedtime.     tamsulosin 0.4 MG Caps capsule  Commonly known as:  FLOMAX  Take 0.4 mg by mouth at bedtime.       Pt will need facility's sliding scale for insulin implemented.  Roxicodone #30 No Refill  Disposition: SNF  Patient's condition:  is Good  Follow up: 1. Dr. Kellie Simmering in 2 weeks with labwork 2. Dr. Jimmy Footman 3 weeks with Renard Hamper, PA-C [facility] [phone] 07/24/2013  11:55 AM   - For VQI Registry use ---   Post-op:  Time to Extubation: [ ]  In OR, [ ]  < 12 hrs, [ ]  12-24 hrs, [ x] >=24 hrs Vasopressors Req. Post-op: Yes ICU Stay: 10 days Transfusion: Yes  If yes, 4 PRBC's units given. 2FFP; and 1500 cc of cellsaver. MI: No, [ ]  Troponin only, [ ]  EKG or Clinical New Arrhythmia: No  Complications: CHF: No Resp failure: Yes, [ x] Pneumonia, [ x] Ventilator Chg in renal function: Yes, [ ]  Inc. Cr > 0.5, [ x] Temp. Dialysis, [ ]  Permanent dialysis Leg ischemia: No, no Surgery needed, [ ]  Yes, Surgery needed, [ ]  Amputation Bowel ischemia: No, [ ]  Medical Rx, [ ]  Surgical Rx Wound complication: No, [ ]  Superficial separation/infection, [ ]  Return to OR Return to OR: No  Return to OR for bleeding: No Stroke: No, [ ]   Minor, [ ]  Major  Discharge medications: Statin use:  Yes If No: [ ]  For Medical reasons, [ ]  Non-compliant ASA use:  Yes  If No: [ ]  For Medical reasons, [ ]  Non-compliant Plavix use:  No If No: [ ]  For Medical reasons, [ ]  Non-compliant Beta blocker use:  Yes If No: [ ]  For Medical reasons, [ ]  Non-compliant

## 2013-07-24 NOTE — Progress Notes (Signed)
Would appreciate nephrology's input for pt's outpatient medication regarding diuretics/K+ that he should be discharged on if any.  Appreciate your input!  Leontine Locket 07/24/2013 11:51 AM

## 2013-07-24 NOTE — Progress Notes (Addendum)
[facility] [place]  Subjective  - Doing well.  He walked 2 times yesterday.  Taking PO's well.   Objective 129/66 101 98.8 F (37.1 C) (Oral) 18 96%  Intake/Output Summary (Last 24 hours) at 07/24/13 0817 Last data filed at 07/24/13 0600  Gross per 24 hour  Intake   1062 ml  Output    851 ml  Net    211 ml    Feet warm well perfused Abdomin soft +BS Voiding independently Left groin dressing removed soft without hematoma    Assessment/Planning: POD #14 Resection and Graftiong of perirenal AAA; Insertion 14 x 8 Hemashield Graft Aorta to Left Common Iliac and to Right Common Femoral Artery With Ligastion of Right External and Interanl Iliac Artery  Urine out put continues to improve,  Cr 4.25 down from 5.27 07/23/2013 Will continue to encourage walking.   Laurence Slate [facility] 07/24/2013 8:17 AM --  Laboratory Lab Results:  Recent Labs  07/22/13 0515 07/24/13 0500  WBC 10.3 9.6  HGB 7.8* 7.9*  HCT 23.4* 23.7*  PLT 200 259   BMET  Recent Labs  07/23/13 0510 07/24/13 0601  NA 142 145  K 3.6* 3.2*  CL 102 105  CO2 20 24  GLUCOSE 131* 126*  BUN 122* 99*  CREATININE 5.27* 4.25*  CALCIUM 8.1* 8.4    COAG Lab Results  Component Value Date   INR 1.34 07/10/2013   INR 1.04 07/04/2013   INR 1.13 11/04/2012   No results found for this basename: PTT     Agree with the above assessment Abdomen softer today and nontender with nicely healing midline incision Right inguinal wound healing nicely with 2+ posterior tibial pulses palpable, bilaterally  Continues to have daily improvement in renal function with creatinine today and 4.2 range Plan DC 2 clap nursing facility tomorrow Will DC skin staples prior to discharge tomorrow Will continue IV antibiotics through tomorrow until discharge and then discontinue antibiotics  Patient to return to see me in 2 weeks with CBC and c met

## 2013-07-24 NOTE — Progress Notes (Signed)
Patient placed on CPAP.  Tolerating well at this time.  RT will continue to monitor.

## 2013-07-25 ENCOUNTER — Telehealth: Payer: Self-pay | Admitting: Vascular Surgery

## 2013-07-25 ENCOUNTER — Other Ambulatory Visit: Payer: Self-pay | Admitting: *Deleted

## 2013-07-25 LAB — CBC
HEMATOCRIT: 23.5 % — AB (ref 39.0–52.0)
Hemoglobin: 7.4 g/dL — ABNORMAL LOW (ref 13.0–17.0)
MCH: 29 pg (ref 26.0–34.0)
MCHC: 31.5 g/dL (ref 30.0–36.0)
MCV: 92.2 fL (ref 78.0–100.0)
Platelets: 292 10*3/uL (ref 150–400)
RBC: 2.55 MIL/uL — ABNORMAL LOW (ref 4.22–5.81)
RDW: 16.6 % — ABNORMAL HIGH (ref 11.5–15.5)
WBC: 8.8 10*3/uL (ref 4.0–10.5)

## 2013-07-25 LAB — GLUCOSE, CAPILLARY
GLUCOSE-CAPILLARY: 115 mg/dL — AB (ref 70–99)
Glucose-Capillary: 111 mg/dL — ABNORMAL HIGH (ref 70–99)

## 2013-07-25 LAB — RENAL FUNCTION PANEL
ALBUMIN: 2.5 g/dL — AB (ref 3.5–5.2)
BUN: 79 mg/dL — AB (ref 6–23)
CALCIUM: 8.6 mg/dL (ref 8.4–10.5)
CO2: 24 mEq/L (ref 19–32)
Chloride: 109 mEq/L (ref 96–112)
Creatinine, Ser: 3.35 mg/dL — ABNORMAL HIGH (ref 0.50–1.35)
GFR calc Af Amer: 20 mL/min — ABNORMAL LOW (ref >90)
GFR calc non Af Amer: 17 mL/min — ABNORMAL LOW (ref >90)
GLUCOSE: 119 mg/dL — AB (ref 70–99)
Phosphorus: 4.2 mg/dL (ref 2.3–4.6)
Potassium: 3.5 mEq/L — ABNORMAL LOW (ref 3.7–5.3)
Sodium: 148 mEq/L — ABNORMAL HIGH (ref 137–147)

## 2013-07-25 NOTE — Progress Notes (Signed)
Pt discharge to [facility]. Transport by daughter. Home care instructions given, daughter and wife and pt verb understanding.

## 2013-07-25 NOTE — Telephone Encounter (Addendum)
Message copied by Gena Fray on Fri Jul 25, 2013 10:41 AM ------      Message from: Peter Minium K      Created: Thu Jul 24, 2013 12:26 PM      Regarding: schedule                   ----- Message -----         From: Gabriel Earing, PA-C         Sent: 07/24/2013  11:53 AM           To: Mena Goes, CMA, [facility] Charge Pool            S/p AAA.  F/u with Dr. Kellie Simmering in 2 weeks.  Please have [facility] draw CBC and BMP the day before he arrives as Dr. Kellie Simmering wants to see his labs at his appt.            Thanks,       Aldona Bar ------  07/25/13: I spoke with Hassan Rowan at [facility] to inform of patients follow up scheduled for 08/12/2013 at 9:15am. I also requested that a CBC and BMP be drawn on 08/11/13. I have faxed the order to [facility] attention [phone] (confirmation was recvd). dpm

## 2013-07-25 NOTE — Progress Notes (Addendum)
[facility] Progress Note  07/25/2013 7:30 AM 15 Days Post-Op  Subjective:  No complaints "tired of eggs"  afebrile [id number] systolic HR [id number] regular 92% 2LO2NC  Filed Vitals:   07/25/13 0430  BP: 139/85  Pulse: 98  Temp: 98.1 F (36.7 C)  Resp: 20    Physical Exam: Cardiac:  Regular Lungs:  Expiratory wheezes, otherwise clear Abdomen:  Soft; NT; +BM +BS Incisions:  C/d/i with staples in tact Extremities:  Bilateral feet are warm  CBC    Component Value Date/Time   WBC 8.8 07/25/2013 0358   RBC 2.55* 07/25/2013 0358   HGB 7.4* 07/25/2013 0358   HCT 23.5* 07/25/2013 0358   PLT 292 07/25/2013 0358   MCV 92.2 07/25/2013 0358   MCH 29.0 07/25/2013 0358   MCHC 31.5 07/25/2013 0358   RDW 16.6* 07/25/2013 0358   LYMPHSABS 0.6* 07/22/2013 0515   MONOABS 0.4 07/22/2013 0515   EOSABS 0.2 07/22/2013 0515   BASOSABS 0.0 07/22/2013 0515    BMET    Component Value Date/Time   NA 148* 07/25/2013 0358   K 3.5* 07/25/2013 0358   CL 109 07/25/2013 0358   CO2 24 07/25/2013 0358   GLUCOSE 119* 07/25/2013 0358   BUN 79* 07/25/2013 0358   CREATININE 3.35* 07/25/2013 0358   CREATININE 4.17* 01/02/2013 1618   CALCIUM 8.6 07/25/2013 0358   GFRNONAA 17* 07/25/2013 0358   GFRAA 20* 07/25/2013 0358    INR    Component Value Date/Time   INR 1.34 07/10/2013 1431     Intake/Output Summary (Last 24 hours) at 07/25/13 0730 Last data filed at 07/25/13 0430  Gross per 24 hour  Intake    960 ml  Output   1350 ml  Net   -390 ml     Assessment:  73 y.o. male is s/p:  Resection and Graftiong of perirenal AAA; Insertion 14 x 8 Hemashield Graft Aorta to Left Common Iliac and to Right Common Femoral Artery With Ligastion of Right External and Interanl Iliac Artery  15 Days Post-Op  Plan: -Creatinine is down today to 3.35 from 4.25 yesterday --will need renal's input on discharge diuretic/K+ medications -Hgb 7.4 today which has been stable over the last several days. Will  hold off on transfusion. -possible discharge to [place] today-expiratory wheezes-continue resp treatments -continue to mobilize -remove staples today -DVT prophylaxis:  Heparin SQ tid   Leontine Locket, PA-C [facility] [phone] 07/25/2013 7:30 AM

## 2013-07-25 NOTE — Progress Notes (Signed)
Clinical Social Work Department CLINICAL SOCIAL WORK PLACEMENT NOTE 07/25/2013  Patient:  [name], [name]  Account Number:  [id number] Admit date:  07/10/2013  Clinical Social Worker:  Megan Salon  Date/time:  07/21/2013 03:36 PM  Clinical Social Work is seeking post-discharge placement for this patient at the following level of care:   [place]   (*CSW will update this form in Epic as items are completed)   07/21/2013  Patient/family provided with [place] Department of Clinical Social Work's list of facilities offering this level of care within the geographic area requested by the patient (or if unable, by the patient's family).  07/21/2013  Patient/family informed of their freedom to choose among providers that offer the needed level of care, that participate in Medicare, Medicaid or managed care program needed by the patient, have an available bed and are willing to accept the patient.  07/21/2013  Patient/family informed of [facility]' ownership interest in [facility], as well as of the fact that they are under no obligation to receive care at this facility.  PASARR submitted to EDS on 07/21/2013 PASARR number received from EDS on 07/21/2013  FL2 transmitted to all facilities in geographic area requested by pt/family on  07/21/2013 FL2 transmitted to all facilities within larger geographic area on   Patient informed that his/her managed care company has contracts with or will negotiate with  certain facilities, including the following:     Patient/family informed of bed offers received:  07/25/2013 Patient chooses bed at OTHER Physician recommends and patient chooses bed at    Patient to be transferred to OTHER on  07/25/2013 Patient to be transferred to facility by [facility]  The following physician request were entered in Epic:   Additional Comments: Patient going to [facility] in [place].  Jeanette Caprice, MSW, [place]

## 2013-07-25 NOTE — Progress Notes (Signed)
Patient ID: [name], male   DOB: February 07, 1941, 73 y.o.   MRN: [id number] [facility]  Patient name: [name] Medical record number: [id number] Date of birth: June 27, 1941 Age: 72 y.o. Gender: male  Primary Care Provider: [name], MD  Subjective:   Pt continues to have minimal SOB, no abdominal pain.  Ready to leave the hospital   Objective: Temp:  [97.4 F (36.3 C)-98.4 F (36.9 C)] 98.1 F (36.7 C) (01/23 0430) Pulse Rate:  [83-98] 98 (01/23 0430) Resp:  [18-22] 20 (01/23 0430) BP: (116-139)/(45-85) 139/85 mmHg (01/23 0430) SpO2:  [92 %-98 %] 92 % (01/23 0430) Weight:  [221 lb 12.5 oz (100.6 kg)] 221 lb 12.5 oz (100.6 kg) (01/23 0430)  Intake/Output Summary (Last 24 hours) at 07/25/13 0756 Last data filed at 07/25/13 0430  Gross per 24 hour  Intake    960 ml  Output   1350 ml  Net   -390 ml   Physical Exam: General: well appearing, NAD.  Cardiovascular: RRR. SEM 3/6 RUSB Respiratory: scattered wheezes, no appreciated crackles  Abdomen: Soft. Slightly tender to palpation. Mildly distended.  Incision healing well. Liver down 5 cm Extremities: Trace LE edema B/L  Neuro: No focal deficits.  Laboratory:  Recent Labs Lab 07/22/13 0515 07/24/13 0500 07/25/13 0358  WBC 10.3 9.6 8.8  HGB 7.8* 7.9* 7.4*  HCT 23.4* 23.7* 23.5*  PLT 200 259 292    Recent Labs Lab 07/23/13 0510 07/24/13 0601 07/25/13 0358  NA 142 145 148*  K 3.6* 3.2* 3.5*  CL 102 105 109  CO2 20 24 24   BUN 122* 99* 79*  CREATININE 5.27* 4.25* 3.35*  CALCIUM 8.1* 8.4 8.6  GLUCOSE 131* 126* 119*    Imaging/Diagnostic Tests: No results found.  Assessment:  1.) POD #14 s/p AAA repair 2.) AKI on CKD s/p AAA repair - acute injury secondary to hypotension, cross clamping of renal arteries (ischemic insults); FENa 3 suggests ATN. Baseline Cr ~1.5 in the past 3rd episode AKI - Slowly improving renal function. Do not know where will equilibrate  3.) CHF: Fluid status stable,  holding further diuresis  4.) DM  5.) CAD 6.) VRE UTI:  Transitioned to Aztreonam  7.) Pseudomonas Resp culture: sensitive to imipenem  8 COPD 9 OSA 10 PVD   Plan:  - BP stable, continue to monitor,  per CCM / surgery.  - UOP stable at 1.3 L  - Creatinine improving daily from post-ATN diuresis, gfr improving too.  Unsure of how far his creatinine will trend down and what his new baseline will be - Possible d/c today to NH, f/u scheduled with renal with labs in one week/ f/u in 3 weeks - Would d/c on Lasix 80 mg BID and KDur 20 meq qd and reassess at f/u if needed  Nolon Rod, DO 07/25/2013, 7:56 AM I have seen and examined this patient and agree with the plan of care seen, eval, and discussed.  F/u scheduled .  Jayleene Glaeser L 07/25/2013, 10:55 AM

## 2013-07-25 NOTE — Progress Notes (Signed)
Clinical Social Worker facilitated patient discharge by contacting the patient, patient's wife by bedside and facility, [facility] in [place]. Patient agreeable to this plan and arranging transport via family. CSW will sign off, as social work intervention is no longer needed.  Jeanette Caprice, MSW, [place]

## 2013-07-28 NOTE — Progress Notes (Signed)
Patient ID: [name], male   DOB: 31-Jul-1940, 73 y.o.   MRN: [id number]

## 2013-07-28 NOTE — Addendum Note (Signed)
Encounter addended by: Mal Misty, MD on: 07/28/2013  3:16 PM<BR>     Documentation filed: Clinical Notes

## 2013-07-29 NOTE — Addendum Note (Signed)
Encounter addended by: Mal Misty, MD on: 07/29/2013  4:19 PM<BR>     Documentation filed: Clinical Notes

## 2013-08-11 ENCOUNTER — Ambulatory Visit (INDEPENDENT_AMBULATORY_CARE_PROVIDER_SITE_OTHER): Payer: Medicare Other | Admitting: Cardiology

## 2013-08-11 ENCOUNTER — Encounter: Payer: Self-pay | Admitting: Cardiology

## 2013-08-11 ENCOUNTER — Encounter: Payer: Self-pay | Admitting: *Deleted

## 2013-08-11 ENCOUNTER — Encounter: Payer: Self-pay | Admitting: Vascular Surgery

## 2013-08-11 VITALS — BP 116/76 | HR 92 | Ht 66.5 in | Wt 209.8 lb

## 2013-08-11 DIAGNOSIS — N183 Chronic kidney disease, stage 3 unspecified: Secondary | ICD-10-CM

## 2013-08-11 DIAGNOSIS — I359 Nonrheumatic aortic valve disorder, unspecified: Secondary | ICD-10-CM

## 2013-08-11 DIAGNOSIS — R0602 Shortness of breath: Secondary | ICD-10-CM

## 2013-08-11 DIAGNOSIS — J8410 Pulmonary fibrosis, unspecified: Secondary | ICD-10-CM

## 2013-08-11 DIAGNOSIS — Z953 Presence of xenogenic heart valve: Secondary | ICD-10-CM

## 2013-08-11 DIAGNOSIS — I5032 Chronic diastolic (congestive) heart failure: Secondary | ICD-10-CM

## 2013-08-11 DIAGNOSIS — I739 Peripheral vascular disease, unspecified: Secondary | ICD-10-CM

## 2013-08-11 DIAGNOSIS — Z952 Presence of prosthetic heart valve: Secondary | ICD-10-CM

## 2013-08-11 NOTE — Patient Instructions (Signed)
Your physician recommends that you have  lab work today--BMET/BNP  Your physician has requested that you have an echocardiogram. Echocardiography is a painless test that uses sound waves to create images of your heart. It provides your doctor with information about the size and shape of your heart and how well your heart's chambers and valves are working. This procedure takes approximately one hour. There are no restrictions for this procedure.  You have been referred to Cardiac Rehab at [facility].   Your physician recommends that you schedule a follow-up appointment in: 2 months with Dr [name].

## 2013-08-12 ENCOUNTER — Ambulatory Visit (INDEPENDENT_AMBULATORY_CARE_PROVIDER_SITE_OTHER): Payer: Self-pay | Admitting: Vascular Surgery

## 2013-08-12 ENCOUNTER — Encounter: Payer: Self-pay | Admitting: Vascular Surgery

## 2013-08-12 VITALS — BP 127/81 | HR 88 | Resp 18 | Ht 65.5 in | Wt 209.0 lb

## 2013-08-12 DIAGNOSIS — I714 Abdominal aortic aneurysm, without rupture, unspecified: Secondary | ICD-10-CM

## 2013-08-12 DIAGNOSIS — N183 Chronic kidney disease, stage 3 unspecified: Secondary | ICD-10-CM | POA: Insufficient documentation

## 2013-08-12 LAB — BASIC METABOLIC PANEL
BUN: 33 mg/dL — ABNORMAL HIGH (ref 6–23)
CALCIUM: 8.8 mg/dL (ref 8.4–10.5)
CHLORIDE: 102 meq/L (ref 96–112)
CO2: 29 mEq/L (ref 19–32)
CREATININE: 1.6 mg/dL — AB (ref 0.4–1.5)
GFR: 44.19 mL/min — ABNORMAL LOW (ref >60.00)
GLUCOSE: 141 mg/dL — AB (ref 70–99)
Potassium: 4 mEq/L (ref 3.5–5.1)
Sodium: 139 mEq/L (ref 135–145)

## 2013-08-12 LAB — BRAIN NATRIURETIC PEPTIDE: Pro B Natriuretic peptide (BNP): 166 pg/mL — ABNORMAL HIGH (ref 0.0–100.0)

## 2013-08-12 NOTE — Progress Notes (Signed)
Subjective:     Patient ID: [name], male   DOB: 02-02-1941, 73 y.o.   MRN: [id number]  HPI this 73 year old male returns for initial followup regarding his abdominal aortic aneurysm resection with aorto to right common femoral and left common iliac bypass. He has severe COPD and chronic congestive heart failure. He developed ATN postoperatively with a creatinine increasing to approximately 7 but this recovered and he has had excellent urinary output since discharge. He continues to be on Lasix 120 mg twice a day. He is breathing without nasal oxygen comfortably and is beginning to ambulate well. He is scheduled to be released from [facility] nursing facility today he was told yesterday that there was a problem with his right inguinal wound and it has packing in it today.  Past Medical History  Diagnosis Date  . Hypertension   . Hyperlipidemia   . Prediabetes   . CHF (congestive heart failure) 07/2012; 10/17/2012  . COPD (chronic obstructive pulmonary disease)     a.  History of heavy smoking. PFTs (4/14) with mixed obstructive (COPD) and restrictive (post-ARDS) picture.   . Pneumonia 07/2012    PNA (H1N1 influenza + pneumococcus) in [id number] complicated by respiratory failure/ARDS. Required tracheostomy, now weaned off. Had left empyema requiring chest tube.   . Diabetes mellitus without complication   . History of blood transfusion     "lots since January" (10/17/2012)  . Ischemic cardiomyopathy   . Anemia of chronic disease   . Aortic stenosis     Moderate to severe by echo in 4/14. Bioprosthetic #21 [facility] Ease aortic valve replacement in 4/14.   Marland Kitchen CAD (coronary artery disease)     a. 4/14 CABG: LIMA-LAD, seq SVG-ramus & OM1, SVG-PDA  . Carotid arterial disease     Carotid dopplers (0000000) with A999333 LICA stenosis.   Marland Kitchen AAA (abdominal aortic aneurysm)     5/14 CT showed > 6 cm AAA, also right iliac aneurysm. Not stent graft candidate.   . Esophageal reflux   . Ischemic cardiomyopathy      a. 4/14 LHC: pLAD 95, ost ramus 70-80, mLCx 90, EF 30%  . Chronic systolic heart failure     a. 1/14 ECHO: sev dil LV, EF30-35%, diff HK, mild MR, AS severe, AV grad 35 b. 8/14 ECHO: EF 55-60%, mild biopros AV sten mn grad. 25, RV mild dil, RA mild dil  . Complication of anesthesia     during last surgery had to be given special medicine b/c "something dropped" during surgery   . Anxiety     pt. admits that he has anxiety at times   . Shortness of breath     still goes deer hunting by himself  . OSA (obstructive sleep apnea)     on cpap- every sleep time.   . Chronic kidney disease     increased creatinine recently - being followed, near kidney failure fr. contrast dye   . Prostate cancer      s/p radiation treatment. - (PT. DENIES)Has indwelling foley.   . Arthritis     History  Substance Use Topics  . Smoking status: Former Smoker -- 2.00 packs/day for 58 years    Types: Cigarettes    Quit date: 07/20/2012  . Smokeless tobacco: Never Used  . Alcohol Use: No     Comment: 10/17/2012 "years since I've had a drink; never had problem with it"    Family History  Problem Relation Age of Onset  . Congestive Heart  Failure    . Heart disease      Allergies  Allergen Reactions  . Omnipaque [Iohexol] Other (See Comments)    Decreased kidney function  . Primaxin [Imipenem] Hives  . Cephalosporins Rash    Blisters    Current outpatient prescriptions:albuterol (PROVENTIL) (2.5 MG/3ML) 0.083% nebulizer solution, Take 3 mLs (2.5 mg total) by nebulization every 2 (two) hours as needed for wheezing or shortness of breath., Disp: , Rfl: ;  allopurinol (ZYLOPRIM) 300 MG tablet, Take 300 mg by mouth daily., Disp: , Rfl: ;  aspirin EC 325 MG EC tablet, Take 1 tablet (325 mg total) by mouth daily., Disp: 30 tablet, Rfl:  atorvastatin (LIPITOR) 80 MG tablet, Take 80 mg by mouth daily before breakfast., Disp: , Rfl: ;  bisoprolol (ZEBETA) 5 MG tablet, Take 2.5 mg by mouth daily., Disp: , Rfl:  ;  docusate sodium 100 MG CAPS, Take 100 mg by mouth 2 (two) times daily as needed for mild constipation., Disp: 10 capsule, Rfl: 0;  furosemide (LASIX) 80 MG tablet, Take 120 mg by mouth 2 (two) times daily., Disp: , Rfl:  nitroGLYCERIN (NITROSTAT) 0.4 MG SL tablet, Place 1 tablet (0.4 mg total) under the tongue every 5 (five) minutes as needed for chest pain., Disp: 25 tablet, Rfl: 12;  pantoprazole (PROTONIX) 40 MG tablet, Take 40 mg by mouth at bedtime. , Disp: , Rfl: ;  potassium chloride SA (K-DUR,KLOR-CON) 20 MEQ tablet, Take 20 mEq by mouth 2 (two) times daily., Disp: , Rfl:  tamsulosin (FLOMAX) 0.4 MG CAPS capsule, Take 0.4 mg by mouth at bedtime. , Disp: , Rfl: ;  zaleplon (SONATA) 10 MG capsule, Take 10 mg by mouth at bedtime as needed for sleep., Disp: , Rfl: ;  arformoterol (BROVANA) 15 MCG/2ML NEBU, Take 2 mLs (15 mcg total) by nebulization 2 (two) times daily., Disp: , Rfl:  budesonide (PULMICORT) 0.25 MG/2ML nebulizer solution, Take 2 mLs (0.25 mg total) by nebulization 2 (two) times daily. Dx 496, Disp: 120 mL, Rfl: 12;  budesonide (PULMICORT) 0.5 MG/2ML nebulizer solution, Take 2 mLs (0.5 mg total) by nebulization 2 (two) times daily., Disp: , Rfl: ;  clonazePAM (KLONOPIN) 0.5 MG tablet, Take 1 tablet (0.5 mg total) by mouth 2 (two) times daily as needed (anxiety)., Disp: 60 tablet, Rfl: 3 oxyCODONE-acetaminophen (PERCOCET/ROXICET) 5-325 MG per tablet, Take 1-2 tablets by mouth every 6 (six) hours as needed for severe pain., Disp: 30 tablet, Rfl: 0  BP 127/81  Pulse 88  Resp 18  Ht 5' 5.5" (1.664 m)  Wt 209 lb (94.802 kg)  BMI 34.24 kg/m2  Body mass index is 34.24 kg/(m^2).           Review of Systems     Objective:   Physical Exam BP 127/81  Pulse 88  Resp 18  Ht 5' 5.5" (1.664 m)  Wt 209 lb (94.802 kg)  BMI 34.24 kg/m2  Gen. chronically ill-appearing elderly male no apparent distress alert and oriented x3 breathing comfortably Lungs rhonchi or wheezing Abdomen  with well-healed midline incision no evidence of ventral hernia Left leg 3+ femoral pulse palpable. Right inguinal wound has packing within it. The inguinal wound has slightly dehisced with the skin and subcutaneous tissue opened. The deep closure is intact. There is no pertinent drainage. This was debridement sharply and repacked with moist saline gauze. Both feet are well-perfused.  Laboratory values today revealed creatinine has now improved to 1.58 with BUN of 29 potassium 4.1 and his hematocrit is  34% with a white count of 6300      Assessment:     Progressing nicely following difficult abdominal aortic aneurysm resection in patient with multiple comorbidities-biggest concern is right inguinal wound which is currently being packed    Plan:     Will begin home health care packing of right inguinal wound on daily basis and a nurse in the family will pack the wound one additional time daily. Will return in one week for me to further examine this Otherwise patient will continue to increase his activity as tolerated

## 2013-08-12 NOTE — Progress Notes (Signed)
Patient ID: [name], male   DOB: [birth year], 73 y.o.   MRN: [id number] PCP: Dr. [name]  73 yo with history of ARDS/respiratory failure and tracheostomy in 1/14 as well as DM, HTN, and COPD presented to the ER at [facility] with CHF in 4/14. Patient had a prolonged hospitalization in 1/14 with H1N1 influenza and pneumococcal PNA. This progressed to ARDS. He was intubated and ended up with tracheostomy. He has had the trach removed. He also had a left empyema requiring chest tube, septic shock with elevated troponin, and AKI which resolved.  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   He was admitted again in 4/14 from his nursing home with exertional dyspnea and orthopnea. He had gained 21 lbs. Echo at admission showed EF 35% with global hypokinesis that looked worse in the anteroseptal wall. He also was noted to have aortic stenosis rated as moderate to severe. He was diuresed and cathed, showing severe 3 vessel disease. He then had CABG-AVR (bioprosthetic valve).   He had a large AAA and underwent surgical repair in 1/15.  He had AKI and Pseudomonas PNA post-operatively.  He was discharged to a nursing home and is actually leaving the nursing home today. Last echo in 1/15 showed EF 55-60% with mildly dilated and dysfunctional RV and a bioprosthetic aortic valve with mean gradient 32 mmHg (higher than expected).    Symptomatically, he is stable.  Weight is down 14 lbs since his last appointment here.  He is not short of breath walking on flat ground and is able to climb steps.  No chest pain.  Last creatinine remained elevated at 3.35.   ECG: NSR, LAFB, RBBB  Labs (5/14): K 3.6, creatinine 0.86, BNP 11508  Labs (6/14): K 4, creatinine 1.1 Labs (7/14): Creatinine 1.57 Potassium 4.3 Labs (8/14): TSH normal, BNP 1372 Labs (9/14): K 3.5, creatinine 1.5 Labs (12/14): K 3.5, creatinine 1.21 Labs (1/15): K 3.5, creatinine 3.35  ROS: All systems negative except as listed  in HPI, PMH and Problem List.  PMH: 1. PNA (H1N1 influenza + pneumococcus) in [id number] complicated by respiratory failure/ARDS. Required tracheostomy, now weaned off. Had left empyema requiring chest tube.  Has developed post-infectious pulmonary fibrosis.  2. Prostate CA s/p radiation treatment. Has indwelling foley.  3. OSA: using CPAP 4. HTN  5. Hyperlipidemia  6. COPD: History of heavy smoking. PFTs (4/14) with mixed obstructive (COPD) and restrictive (post-ARDS) picture.  7. Type II diabetes  8. Anemia of chronic disease.  9. Ischemic cardiomyopathy: Echo (1/14) with severely dilated LV, EF 30-35%, diffuse hypokinesis worse in the anteroseptal wall, grade II diastolic dysfunction, mild MR, AS interpreted as "moderate to severe" with aortic valve mean gradient 23 mmHg. Echo (4/14) witih EF 35-40%, mid to apical anteroseptal akinesis, moderate to severe AS with mean gradient 33 mmHg, AVA 0.91 cm^2. Echo (5/14) post CABG showed EF 50%, anteroseptal hypokinesis, bioprosthetic aortic valve well-seated. Echo (1/15) with EF 55-60%, mild LVH, mildly dilated RV with mildly decreased systolic function, D-shaped interventricular septum, bioprosthetic aortic valve with mean gardient 32 mmHg.  10. Aortic stenosis: Moderate to severe by echo in 4/14. Bioprosthetic #21 [facility] Ease aortic valve replacement in 4/14.  11. CAD: LHC (4/14) with 95% pLAD, 70-80% ostial ramus, 70% mLCx, 90% pRCA, EF 35%. CABG 4/14 with LIMA-LAD, seq SVG-ramus and OM1, SVG-PDA.  12. Carotid stenosis: Carotid dopplers (0000000) with A999333 LICA stenosis.  Carotids (123456) with 123456 LICA stenosis.  13. AAA: 5/14 CT showed >  6 cm AAA, also right iliac aneurysm. Not stent graft candidate.  7/14 CT showed 7 cm AAA.  Surgical repair 1/15.  14. CKD: AKI in 7/14 from contrast nephropathy.  AKI in 1/15 after AAA repair.  15. Contrast allergy.  16. ABIs 9/14 were normal.  17. Gout  SH: Quit smoking in 1/14, married, no ETOH, lives in  [place].   FH: No premature CAD  ROS: All systems reviewed and negative except as per HPI.    Current Outpatient Prescriptions  Medication Sig Dispense Refill  . albuterol (PROVENTIL) (2.5 MG/3ML) 0.083% nebulizer solution Take 3 mLs (2.5 mg total) by nebulization every 2 (two) hours as needed for wheezing or shortness of breath.      . allopurinol (ZYLOPRIM) 300 MG tablet Take 300 mg by mouth daily.      Marland Kitchen arformoterol (BROVANA) 15 MCG/2ML NEBU Take 2 mLs (15 mcg total) by nebulization 2 (two) times daily.      Marland Kitchen aspirin EC 325 MG EC tablet Take 1 tablet (325 mg total) by mouth daily.  30 tablet    . atorvastatin (LIPITOR) 80 MG tablet Take 80 mg by mouth daily before breakfast.      . bisoprolol (ZEBETA) 5 MG tablet Take 2.5 mg by mouth daily.      . budesonide (PULMICORT) 0.25 MG/2ML nebulizer solution Take 2 mLs (0.25 mg total) by nebulization 2 (two) times daily. Dx 496  120 mL  12  . budesonide (PULMICORT) 0.5 MG/2ML nebulizer solution Take 2 mLs (0.5 mg total) by nebulization 2 (two) times daily.      . clonazePAM (KLONOPIN) 0.5 MG tablet Take 1 tablet (0.5 mg total) by mouth 2 (two) times daily as needed (anxiety).  60 tablet  3  . docusate sodium 100 MG CAPS Take 100 mg by mouth 2 (two) times daily as needed for mild constipation.  10 capsule  0  . furosemide (LASIX) 80 MG tablet Take 120 mg by mouth 2 (two) times daily.      . nitroGLYCERIN (NITROSTAT) 0.4 MG SL tablet Place 1 tablet (0.4 mg total) under the tongue every 5 (five) minutes as needed for chest pain.  25 tablet  12  . oxyCODONE-acetaminophen (PERCOCET/ROXICET) 5-325 MG per tablet Take 1-2 tablets by mouth every 6 (six) hours as needed for severe pain.  30 tablet  0  . pantoprazole (PROTONIX) 40 MG tablet Take 40 mg by mouth at bedtime.       . potassium chloride SA (K-DUR,KLOR-CON) 20 MEQ tablet Take 20 mEq by mouth 2 (two) times daily.      . tamsulosin (FLOMAX) 0.4 MG CAPS capsule Take 0.4 mg by mouth at bedtime.        . zaleplon (SONATA) 10 MG capsule Take 10 mg by mouth at bedtime as needed for sleep.       No current facility-administered medications for this visit.     PHYSICAL EXAM: Filed Vitals:   08/11/13 1557  BP: 116/76  Pulse: 92  Height: 5' 6.5" (1.689 m)  Weight: 95.165 kg (209 lb 12.8 oz)    General:  NAD.  HEENT: normal Neck: Thick, JVP 7. Carotids 2+ bilaterally; no bruits. No lymphadenopathy or thryomegaly appreciated. Cor: PMI normal. Regular rate & rhythm. No rubs, gallops. 1/6 SEM RUSB.  Lungs: Slight crackles at bases.  Abdomen: soft, nontender, nondistended. No hepatosplenomegaly. No bruits or masses. Good bowel sounds. Extremities: no cyanosis, clubbing, rash, no edema. Neuro: alert & orientedx3, cranial nerves grossly  intact. Moves all 4 extremities w/o difficulty. Affect pleasant.  ASSESSMENT & PLAN: 1. CAD: Status post CABG for 3VD. No chest pain. - Continue ASA, statin, and bisoprolol.  2. Chronic diastolic CHF: EF around [id number] prior to surgery, appeared to improve post-op with last echo showing EF around 55-60%. Stable NYHA class II-III symptoms.  He is on high dose Lasix and does not appear significantly volume overloaded.  - Continue Lasix 120 mg bid.   - BMET/BNP 3. 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 I suspect that intrinsic lung disease (post-infectious pulmonary fibrosis) is a contributor to his dyspnea and hypoxemia. He is actually now off oxygen (just using CPAP at night).  4. Carotid stenosis: Followed at [facility]. 5. Hyperlipidemia: Continue statin.   6. AAA: s/p surgical repair. 7. Status post bioprosthetic AVR: Gradient across the aortic valve was elevated in 1/15.  However, this was around the time of his AAA surgery and may be reflective of high flow.  I would like to get a repeat echo to reassess his bioprosthetic valve.  8. CKD: BMET today.  9. I recommended that he do cardiac rehab again.    Loralie Champagne 08/12/2013

## 2013-08-18 ENCOUNTER — Other Ambulatory Visit: Payer: Self-pay | Admitting: Cardiology

## 2013-08-19 ENCOUNTER — Ambulatory Visit: Payer: Medicare Other | Admitting: Vascular Surgery

## 2013-08-20 ENCOUNTER — Other Ambulatory Visit: Payer: Self-pay

## 2013-08-20 MED ORDER — ATORVASTATIN CALCIUM 80 MG PO TABS: 80.0000 mg | ORAL_TABLET | Freq: Every day | ORAL | Status: DC

## 2013-08-25 ENCOUNTER — Encounter: Payer: Self-pay | Admitting: Vascular Surgery

## 2013-08-26 ENCOUNTER — Ambulatory Visit (INDEPENDENT_AMBULATORY_CARE_PROVIDER_SITE_OTHER): Payer: Self-pay | Admitting: Vascular Surgery

## 2013-08-26 ENCOUNTER — Encounter: Payer: Self-pay | Admitting: Vascular Surgery

## 2013-08-26 VITALS — BP 124/84 | HR 106 | Resp 16 | Ht 65.5 in | Wt 211.0 lb

## 2013-08-26 DIAGNOSIS — I714 Abdominal aortic aneurysm, without rupture, unspecified: Secondary | ICD-10-CM

## 2013-08-26 NOTE — Progress Notes (Signed)
Subjective:     Patient ID: [name], male   DOB: [birth year], 73 y.o.   MRN: [id number]  HPI this 73 year old male returns for continued followup regarding his extensive abdominal aortic aneurysm resection which required postoperative ventilation. He also had ATN and required hemodialysis briefly. His renal function has returned and his biggest issue has been nonhealing anal wound which has been packed by the family over the last few weeks. Home health is visiting once a week. Wife reports that the wound has looked much better and has decreased in size. He has had no chills and fever. His appetite is good. Beginning to ambulate better.  Review of Systems     Objective:   Physical Exam BP 124/84  Pulse 106  Resp 16  Ht 5' 5.5" (1.664 m)  Wt 211 lb (95.709 kg)  BMI 34.57 kg/m2  Gen. were noted x3 in no apparent stress The abdominal incision remains well healed no evidence ventral hernia Right inguinal wound granulating nicely and is closed in except for the most superficial one to 2 cm. It measures about 2.5 x 2.5 cm. 3+ posterior tibial pulses palpable bilaterally with minimal distal edema.     Assessment:     Patient continued to make excellent progress with nicely granulating right inguinal wound.     Plan:     Will return in 6 weeks for further wound check. Continue followup with medical doctor, cardiologist, and nephrologist.  patient making great progress

## 2013-08-27 ENCOUNTER — Telehealth: Payer: Self-pay | Admitting: Cardiology

## 2013-08-27 NOTE — Telephone Encounter (Signed)
I did not know these meds were changed.  Have no idea why bisoprolol would have been stopped.  That may be why his HR is up.  He needs to restart bisoprolol 5 mg daily (same as before).  I will see him in the office early next week (please arrange for CHF clinic Tuesday or Wednesday morning) and I will see if he needs to go back up on Lasix.  His creatinine had been much better.

## 2013-08-27 NOTE — Telephone Encounter (Signed)
Spoke with patient who states the home health nurse came by earlier today and told him his heart rate was high at 111 bpm.  Patient states Dr. Jimmy Footman decreased his Lasix to 80 mg BID and told him to stop Bisoprolol and Metolazone.  Patient denies complaints; states he may be a little more SOB than normal but his chief complaint is the increased heart rate.  Patient states BP has been WNL - today it was 124/70.  I advised patient that I will send message to Dr. [name] as I am not sure whether he is aware of the medication changes by Dr. Jimmy Footman.  I advised patient that our office will be closing early today and will not open tomorrow due to weather but to call the on call provider or 911 if symptoms worsen.  Patient verbalized understanding and agreement.

## 2013-08-27 NOTE — Telephone Encounter (Signed)
New message   Patient calling C/O increase heart rate 112 . 124/70    Home health in the home  : Zollie Scale  [phone] x 2 since she has seen the patient.

## 2013-08-27 NOTE — Telephone Encounter (Signed)
Called [facility] and scheduled patient to see Dr. [name] on Wed. 3/4 at 9:20 am. Patient has an echo scheduled for Tues. 3/3 here at [facility].  Patient states he has only taken Bisoprolol 2.5 mg daily and will resume this dosage per Dr. Claris Gladden recommendation.  I advised patient of appointments for next week and to call on-call provider if needed prior to appointments next week as our office may be closed due to bad weather.  Patient verbalized understanding and agreement.  I called Zollie Scale, the home health nurse and updated her on Dr. Claris Gladden advice and patient's appointments.  Jackelyn Poling states the patient has her phone number and she lives very close to him so she can go and check on him if he needs help.  Jackelyn Poling thanked me for the call.

## 2013-09-02 ENCOUNTER — Ambulatory Visit (HOSPITAL_COMMUNITY): Payer: Medicare Other | Attending: Cardiology | Admitting: Radiology

## 2013-09-02 ENCOUNTER — Other Ambulatory Visit (HOSPITAL_COMMUNITY): Payer: Self-pay | Admitting: Radiology

## 2013-09-02 ENCOUNTER — Encounter: Payer: Self-pay | Admitting: Cardiology

## 2013-09-02 ENCOUNTER — Other Ambulatory Visit (HOSPITAL_COMMUNITY): Payer: Self-pay | Admitting: Cardiology

## 2013-09-02 DIAGNOSIS — I359 Nonrheumatic aortic valve disorder, unspecified: Secondary | ICD-10-CM | POA: Insufficient documentation

## 2013-09-02 DIAGNOSIS — R0602 Shortness of breath: Secondary | ICD-10-CM | POA: Insufficient documentation

## 2013-09-02 NOTE — Progress Notes (Signed)
Echocardiogram Performed. 

## 2013-09-03 ENCOUNTER — Ambulatory Visit (HOSPITAL_COMMUNITY)
Admission: RE | Admit: 2013-09-03 | Discharge: 2013-09-03 | Disposition: A | Discharge status: Home / Self Care | Payer: Medicare Other | Source: Ambulatory Visit | Attending: Internal Medicine | Admitting: Internal Medicine

## 2013-09-03 VITALS — BP 110/74 | HR 90 | Wt 210.5 lb

## 2013-09-03 DIAGNOSIS — I5022 Chronic systolic (congestive) heart failure: Secondary | ICD-10-CM | POA: Insufficient documentation

## 2013-09-03 DIAGNOSIS — Z952 Presence of prosthetic heart valve: Secondary | ICD-10-CM | POA: Insufficient documentation

## 2013-09-03 DIAGNOSIS — I255 Ischemic cardiomyopathy: Secondary | ICD-10-CM

## 2013-09-03 DIAGNOSIS — I714 Abdominal aortic aneurysm, without rupture, unspecified: Secondary | ICD-10-CM | POA: Insufficient documentation

## 2013-09-03 DIAGNOSIS — I2589 Other forms of chronic ischemic heart disease: Secondary | ICD-10-CM

## 2013-09-03 DIAGNOSIS — Z953 Presence of xenogenic heart valve: Secondary | ICD-10-CM

## 2013-09-03 DIAGNOSIS — I428 Other cardiomyopathies: Secondary | ICD-10-CM | POA: Insufficient documentation

## 2013-09-03 DIAGNOSIS — N183 Chronic kidney disease, stage 3 unspecified: Secondary | ICD-10-CM | POA: Insufficient documentation

## 2013-09-03 DIAGNOSIS — I2510 Atherosclerotic heart disease of native coronary artery without angina pectoris: Secondary | ICD-10-CM | POA: Insufficient documentation

## 2013-09-03 LAB — BASIC METABOLIC PANEL
BUN: 30 mg/dL — AB (ref 6–23)
CALCIUM: 9.8 mg/dL (ref 8.4–10.5)
CO2: 26 meq/L (ref 19–32)
Chloride: 103 mEq/L (ref 96–112)
Creatinine, Ser: 1.76 mg/dL — ABNORMAL HIGH (ref 0.50–1.35)
GFR calc Af Amer: 43 mL/min — ABNORMAL LOW (ref >90)
GFR calc non Af Amer: 37 mL/min — ABNORMAL LOW (ref >90)
GLUCOSE: 123 mg/dL — AB (ref 70–99)
POTASSIUM: 4.2 meq/L (ref 3.7–5.3)
SODIUM: 144 meq/L (ref 137–147)

## 2013-09-03 LAB — LIPID PANEL
CHOLESTEROL: 151 mg/dL (ref 0–200)
HDL: 26 mg/dL — ABNORMAL LOW (ref >39)
LDL CALC: 75 mg/dL (ref 0–99)
TRIGLYCERIDES: 249 mg/dL — AB (ref <150)
Total CHOL/HDL Ratio: 5.8 RATIO
VLDL: 50 mg/dL — ABNORMAL HIGH (ref 0–40)

## 2013-09-03 LAB — CBC
HEMATOCRIT: 36.8 % — AB (ref 39.0–52.0)
Hemoglobin: 11.6 g/dL — ABNORMAL LOW (ref 13.0–17.0)
MCH: 29 pg (ref 26.0–34.0)
MCHC: 31.5 g/dL (ref 30.0–36.0)
MCV: 92 fL (ref 78.0–100.0)
Platelets: 191 10*3/uL (ref 150–400)
RBC: 4 MIL/uL — ABNORMAL LOW (ref 4.22–5.81)
RDW: 18.5 % — ABNORMAL HIGH (ref 11.5–15.5)
WBC: 7.1 10*3/uL (ref 4.0–10.5)

## 2013-09-03 LAB — PRO B NATRIURETIC PEPTIDE: Pro B Natriuretic peptide (BNP): 1037 pg/mL — ABNORMAL HIGH (ref 0–125)

## 2013-09-03 NOTE — Patient Instructions (Signed)
Labs today  We will contact you in 3 months to schedule your next appointment.  

## 2013-09-03 NOTE — Progress Notes (Signed)
Patient ID: [name], male   DOB: [birth year], 73 y.o.   MRN: [id number] PCP: Dr. [name]  73 yo with history of ARDS/respiratory failure and tracheostomy in 1/14 as well as DM, HTN, and COPD presented to the ER at [facility] with CHF in 4/14. Patient had a prolonged hospitalization in 1/14 with H1N1 influenza and pneumococcal PNA. This progressed to ARDS. He was intubated and ended up with tracheostomy. He has had the trach removed. He also had a left empyema requiring chest tube, septic shock with elevated troponin, and AKI which resolved.  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   He was admitted again in 4/14 from his nursing home with exertional dyspnea and orthopnea. He had gained 21 lbs. Echo at admission showed EF 35% with global hypokinesis that looked worse in the anteroseptal wall. He also was noted to have aortic stenosis rated as moderate to severe. He was diuresed and cathed, showing severe 3 vessel disease. He then had CABG-AVR (bioprosthetic valve).   He had a large AAA and underwent surgical repair in 1/15.  He had AKI and Pseudomonas PNA post-operatively.  He was discharged to a nursing home and is actually leaving the nursing home today. Echo in 1/15 showed EF 55-60% with mildly dilated and dysfunctional RV and a bioprosthetic aortic valve with mean gradient 32 mmHg (higher than expected).   Repeat limited echo in 3/15 showed shower aortic valve mean gradient of 21 mmHg.   Symptomatically, he is stable.  Weight is stable.  No chest pain.  He is able to walk 750 feet with no dyspnea.  He has occasional mild dizziness if he has been sitting for a long time and stands abruptly.  This has been getting better.  Apparently, this is why his bisoprolol was recently stopped.  After stopping bisoprolol, HR rose to the 120s so I restarted it.  HR is around 90 bpm today.  Patient has been anemic and had a transfusion around the beginning of February.  He is doing  cardiac rehab at [facility]. Creatinine has improved.   ECG (prior): NSR, LAFB, RBBB  Labs (5/14): K 3.6, creatinine 0.86, BNP 11508  Labs (6/14): K 4, creatinine 1.1 Labs (7/14): Creatinine 1.57 Potassium 4.3 Labs (8/14): TSH normal, BNP 1372 Labs (9/14): K 3.5, creatinine 1.5 Labs (12/14): K 3.5, creatinine 1.21 Labs (1/15): K 3.5, creatinine 3.35 Labs (2/15): K 4, creatinine 1.6, BNP 166  ROS: All systems negative except as listed in HPI, PMH and Problem List.  PMH: 1. PNA (H1N1 influenza + pneumococcus) in [id number] complicated by respiratory failure/ARDS. Required tracheostomy, now weaned off. Had left empyema requiring chest tube.  Has developed post-infectious pulmonary fibrosis.  2. Prostate CA s/p radiation treatment. Has indwelling foley.  3. OSA: using CPAP 4. HTN  5. Hyperlipidemia  6. COPD: History of heavy smoking. PFTs (4/14) with mixed obstructive (COPD) and restrictive (post-ARDS) picture.  7. Type II diabetes  8. Anemia of chronic disease.  9. Ischemic cardiomyopathy: Echo (1/14) with severely dilated LV, EF 30-35%, diffuse hypokinesis worse in the anteroseptal wall, grade II diastolic dysfunction, mild MR, AS interpreted as "moderate to severe" with aortic valve mean gradient 23 mmHg. Echo (4/14) witih EF 35-40%, mid to apical anteroseptal akinesis, moderate to severe AS with mean gradient 33 mmHg, AVA 0.91 cm^2. Echo (5/14) post CABG showed EF 50%, anteroseptal hypokinesis, bioprosthetic aortic valve well-seated. Echo (1/15) with EF 55-60%, mild LVH, mildly dilated RV with mildly decreased systolic function,  D-shaped interventricular septum, bioprosthetic aortic valve with mean gardient 32 mmHg.  Echo (3/15) with EF 55-60%, mild LVH, bioprosthetic aortic valve with mean gradient 21 mmHg (lower), no AI, RV moderately dilated with mild to moderately decreased systolic function.  10. Aortic stenosis: Moderate to severe by echo in 4/14. Bioprosthetic #21 [facility] Ease  aortic valve replacement in 4/14.  11. CAD: LHC (4/14) with 95% pLAD, 70-80% ostial ramus, 70% mLCx, 90% pRCA, EF 35%. CABG 4/14 with LIMA-LAD, seq SVG-ramus and OM1, SVG-PDA.  12. Carotid stenosis: Carotid dopplers (0000000) with A999333 LICA stenosis.  Carotids (123456) with 123456 LICA stenosis.  13. AAA: 5/14 CT showed > 6 cm AAA, also right iliac aneurysm. Not stent graft candidate.  7/14 CT showed 7 cm AAA.  Surgical repair 1/15.  14. CKD: AKI in 7/14 from contrast nephropathy.  AKI in 1/15 after AAA repair.  15. Contrast allergy.  16. ABIs 9/14 were normal.  17. Gout  SH: Quit smoking in 1/14, married, no ETOH, lives in [place].   FH: No premature CAD  ROS: All systems reviewed and negative except as per HPI.    Current Outpatient Prescriptions  Medication Sig Dispense Refill  . albuterol (PROVENTIL) (2.5 MG/3ML) 0.083% nebulizer solution Take 3 mLs (2.5 mg total) by nebulization every 2 (two) hours as needed for wheezing or shortness of breath.      . allopurinol (ZYLOPRIM) 300 MG tablet Take 300 mg by mouth daily.      Marland Kitchen aspirin EC 325 MG EC tablet Take 1 tablet (325 mg total) by mouth daily.  30 tablet    . atorvastatin (LIPITOR) 80 MG tablet Take 1 tablet (80 mg total) by mouth daily before breakfast.  30 tablet  6  . bisoprolol (ZEBETA) 5 MG tablet Take 2.5 mg by mouth daily.      . budesonide (PULMICORT) 0.25 MG/2ML nebulizer solution Take 2 mLs (0.25 mg total) by nebulization 2 (two) times daily. Dx 496  120 mL  12  . clonazePAM (KLONOPIN) 0.5 MG tablet Take 1 tablet (0.5 mg total) by mouth 2 (two) times daily as needed (anxiety).  60 tablet  3  . docusate sodium 100 MG CAPS Take 100 mg by mouth 2 (two) times daily as needed for mild constipation.  10 capsule  0  . furosemide (LASIX) 80 MG tablet Take 80 mg by mouth 2 (two) times daily.       . nitroGLYCERIN (NITROSTAT) 0.4 MG SL tablet Place 1 tablet (0.4 mg total) under the tongue every 5 (five) minutes as needed for chest pain.   25 tablet  12  . pantoprazole (PROTONIX) 40 MG tablet Take 40 mg by mouth at bedtime.       . potassium chloride SA (K-DUR,KLOR-CON) 20 MEQ tablet Take 20 mEq by mouth 2 (two) times daily.      . tamsulosin (FLOMAX) 0.4 MG CAPS capsule Take 0.4 mg by mouth at bedtime.        No current facility-administered medications for this encounter.     PHYSICAL EXAM: Filed Vitals:   09/03/13 0955  BP: 110/74  Pulse: 90  Weight: 210 lb 8 oz (95.482 kg)  SpO2: 93%    General:  NAD, overweight HEENT: normal Neck: Thick, JVP 7. Carotids 2+ bilaterally; no bruits. No lymphadenopathy or thryomegaly appreciated. Cor: PMI normal. Regular rate & rhythm. No rubs, gallops. 1/6 SEM RUSB.  Lungs: Slight crackles at bases.  Abdomen: soft, nontender, nondistended. No hepatosplenomegaly. No bruits or  masses. Good bowel sounds. Extremities: no cyanosis, clubbing, rash, no edema. Neuro: alert & orientedx3, cranial nerves grossly intact. Moves all 4 extremities w/o difficulty. Affect pleasant.  ASSESSMENT & PLAN: 1. CAD: Status post CABG for 3VD. No chest pain. - Continue ASA, statin, and bisoprolol.  2. Chronic diastolic CHF: EF around [id number] prior to surgery, appeared to improve post-op with last echo showing EF around 55-60%. Stable NYHA class II-III symptoms. He does not look volume overloaded on exam. - Continue Lasix 80 mg bid.   - BMET/BNP today. 3. 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 I suspect that intrinsic lung disease (post-infectious pulmonary fibrosis) is a contributor to his dyspnea and hypoxemia. He is actually now off oxygen (just using CPAP at night).  4. Carotid stenosis: Followed at [facility]. 5. Hyperlipidemia: Continue statin.  Will check lipids today.  6. AAA: s/p surgical repair. 7. Status post bioprosthetic AVR: Gradient across the aortic valve was elevated in 1/15.  However, this was around the time of his AAA surgery and may be reflective of high flow.  Repeat  echo in 3/15 showed lower gradient, 21 mmHg.  8. CKD: BMET today. Last creatinine showed improvement.  9. Anemia: Will get CBC today.    Loralie Champagne 09/03/2013

## 2013-10-04 ENCOUNTER — Other Ambulatory Visit (HOSPITAL_COMMUNITY): Payer: Self-pay | Admitting: Adult Health

## 2013-10-11 ENCOUNTER — Other Ambulatory Visit (HOSPITAL_COMMUNITY): Payer: Self-pay | Admitting: Adult Health

## 2013-10-12 ENCOUNTER — Other Ambulatory Visit (HOSPITAL_COMMUNITY): Payer: Self-pay | Admitting: Cardiology

## 2013-10-13 ENCOUNTER — Encounter: Payer: Self-pay | Admitting: Vascular Surgery

## 2013-10-14 ENCOUNTER — Ambulatory Visit (INDEPENDENT_AMBULATORY_CARE_PROVIDER_SITE_OTHER): Payer: Medicare Other | Admitting: Vascular Surgery

## 2013-10-14 ENCOUNTER — Encounter: Payer: Self-pay | Admitting: Vascular Surgery

## 2013-10-14 VITALS — BP 105/62 | HR 81 | Ht 65.5 in | Wt 215.0 lb

## 2013-10-14 DIAGNOSIS — I714 Abdominal aortic aneurysm, without rupture, unspecified: Secondary | ICD-10-CM

## 2013-10-14 DIAGNOSIS — Z48812 Encounter for surgical aftercare following surgery on the circulatory system: Secondary | ICD-10-CM

## 2013-10-14 NOTE — Addendum Note (Signed)
Addended by: Dorthula Rue L on: 10/14/2013 04:47 PM   Modules accepted: Orders

## 2013-10-14 NOTE — Progress Notes (Signed)
Subjective:     Patient ID: [name], male   DOB: Dec 30, 1940, 73 y.o.   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 He had postoperative respiratory failure and was on the ventilator and also had some renal failure requiring hemodialysis briefly. He's done well since his discharge from the hospital. He had a right inguinal wound which was slow to heal and has been packed and now healed completely. He denies any specific symptoms. He is out occasionally milling his daughters long and his O2 sats today were 100%.  Past Medical History  Diagnosis Date  . Hypertension   . Hyperlipidemia   . Prediabetes   . CHF (congestive heart failure) 07/2012; 10/17/2012  . COPD (chronic obstructive pulmonary disease)     a.  History of heavy smoking. PFTs (4/14) with mixed obstructive (COPD) and restrictive (post-ARDS) picture.   . Pneumonia 07/2012    PNA (H1N1 influenza + pneumococcus) in [id number] complicated by respiratory failure/ARDS. Required tracheostomy, now weaned off. Had left empyema requiring chest tube.   . Diabetes mellitus without complication   . History of blood transfusion     "lots since January" (10/17/2012)  . Ischemic cardiomyopathy   . Anemia of chronic disease   . Aortic stenosis     Moderate to severe by echo in 4/14. Bioprosthetic #21 [facility] Ease aortic valve replacement in 4/14.   Marland Kitchen CAD (coronary artery disease)     a. 4/14 CABG: LIMA-LAD, seq SVG-ramus & OM1, SVG-PDA  . Carotid arterial disease     Carotid dopplers (0000000) with A999333 LICA stenosis.   Marland Kitchen AAA (abdominal aortic aneurysm)     5/14 CT showed > 6 cm AAA, also right iliac aneurysm. Not stent graft candidate.   . Esophageal reflux   . Ischemic cardiomyopathy     a. 4/14 LHC: pLAD 95, ost ramus 70-80, mLCx 90, EF 30%  . Chronic systolic heart failure     a. 1/14 ECHO: sev dil  LV, EF30-35%, diff HK, mild MR, AS severe, AV grad 35 b. 8/14 ECHO: EF 55-60%, mild biopros AV sten mn grad. 25, RV mild dil, RA mild dil  . Complication of anesthesia     during last surgery had to be given special medicine b/c "something dropped" during surgery   . Anxiety     pt. admits that he has anxiety at times   . Shortness of breath     still goes deer hunting by himself  . OSA (obstructive sleep apnea)     on cpap- every sleep time.   . Chronic kidney disease     increased creatinine recently - being followed, near kidney failure fr. contrast dye   . Prostate cancer      s/p radiation treatment. - (PT. DENIES)Has indwelling foley.   . Arthritis     History  Substance Use Topics  . Smoking status: Former Smoker -- 2.00 packs/day for 58 years    Types: Cigarettes    Quit date: 07/20/2012  . Smokeless tobacco: Never Used  . Alcohol Use: No     Comment: 10/17/2012 "years since I've had a drink; never had problem with it"    Family History  Problem Relation Age of Onset  . Congestive Heart Failure    . Heart disease      Allergies  Allergen Reactions  . Omnipaque [Iohexol] Other (See  Comments)    Decreased kidney function  . Primaxin [Imipenem] Hives  . Cephalosporins Rash    Blisters    Current outpatient prescriptions:albuterol (PROVENTIL) (2.5 MG/3ML) 0.083% nebulizer solution, Take 3 mLs (2.5 mg total) by nebulization every 2 (two) hours as needed for wheezing or shortness of breath., Disp: , Rfl: ;  allopurinol (ZYLOPRIM) 300 MG tablet, Take 300 mg by mouth daily., Disp: , Rfl: ;  aspirin EC 325 MG EC tablet, Take 1 tablet (325 mg total) by mouth daily., Disp: 30 tablet, Rfl:  atorvastatin (LIPITOR) 80 MG tablet, Take 1 tablet (80 mg total) by mouth daily before breakfast., Disp: 30 tablet, Rfl: 6;  bisoprolol (ZEBETA) 5 MG tablet, Take 2.5 mg by mouth daily., Disp: , Rfl: ;  bisoprolol (ZEBETA) 5 MG tablet, TAKE 1/2 TABLET BY MOUTH AT BEDTIME, Disp: 15 tablet, Rfl:  3;  bisoprolol (ZEBETA) 5 MG tablet, TAKE 1/2 TABLET BY MOUTH AT BEDTIME, Disp: 15 tablet, Rfl: 3 budesonide (PULMICORT) 0.25 MG/2ML nebulizer solution, Take 2 mLs (0.25 mg total) by nebulization 2 (two) times daily. Dx 496, Disp: 120 mL, Rfl: 12;  clonazePAM (KLONOPIN) 0.5 MG tablet, Take 1 tablet (0.5 mg total) by mouth 2 (two) times daily as needed (anxiety)., Disp: 60 tablet, Rfl: 3;  docusate sodium 100 MG CAPS, Take 100 mg by mouth 2 (two) times daily as needed for mild constipation., Disp: 10 capsule, Rfl: 0 furosemide (LASIX) 80 MG tablet, Take 80 mg by mouth 2 (two) times daily. , Disp: , Rfl: ;  furosemide (LASIX) 80 MG tablet, Take 1 tablet (80 mg total) by mouth 2 (two) times daily., Disp: 60 tablet, Rfl: 0;  nitroGLYCERIN (NITROSTAT) 0.4 MG SL tablet, Place 1 tablet (0.4 mg total) under the tongue every 5 (five) minutes as needed for chest pain., Disp: 25 tablet, Rfl: 12 pantoprazole (PROTONIX) 40 MG tablet, Take 40 mg by mouth at bedtime. , Disp: , Rfl: ;  potassium chloride SA (K-DUR,KLOR-CON) 20 MEQ tablet, Take 20 mEq by mouth 2 (two) times daily., Disp: , Rfl: ;  tamsulosin (FLOMAX) 0.4 MG CAPS capsule, Take 0.4 mg by mouth at bedtime. , Disp: , Rfl:   BP 105/62  Pulse 81  Ht 5' 5.5" (1.664 m)  Wt 215 lb (97.523 kg)  BMI 35.22 kg/m2  SpO2 99%  Body mass index is 35.22 kg/(m^2).           Review of Systems denies active chest pain but does have dyspnea on exertion and also denies hemoptysis and lower extremity claudication symptoms. No lateralizing weakness, aphasia, amaurosis fugax, other systems negative review of systems    Objective:   Physical Exam BP 105/62  Pulse 81  Ht 5' 5.5" (1.664 m)  Wt 215 lb (97.523 kg)  BMI 35.22 kg/m2  SpO2 99%  Gen.-alert and oriented x3 in no apparent distress HEENT normal for age Lungs no rhonchi or wheezing Cardiovascular regular rhythm no murmurs carotid pulses 3+ palpable no bruits audible Abdomen soft nontender no palpable  masses-obese-midline incision well healed with no evidence of ventral hernia  Musculoskeletal free of  major deformities Skin clear -no rashes Neurologic normal Lower extremities 3+ femoral and dorsalis pedis pulses palpable bilaterally with no edema right inguinal wound well healed         Assessment:     Excellent recovery following difficult aneurysm resection with large right common iliac artery aneurysm requiring postoperative ventilation and hemodialysis-currently creatinine in the 1.6 range    Plan:  Patient will return in 9 months with CT scan of abdomen and pelvis without contrast and carotid duplex exam and ABIs  bilateral lower extremities

## 2013-11-06 ENCOUNTER — Encounter (HOSPITAL_COMMUNITY): Payer: Self-pay

## 2013-11-06 ENCOUNTER — Ambulatory Visit (HOSPITAL_COMMUNITY)
Admission: RE | Admit: 2013-11-06 | Discharge: 2013-11-06 | Disposition: A | Discharge status: Home / Self Care | Payer: Medicare Other | Source: Ambulatory Visit | Attending: Internal Medicine | Admitting: Internal Medicine

## 2013-11-06 VITALS — BP 96/58 | HR 87 | Resp 18 | Wt 216.0 lb

## 2013-11-06 DIAGNOSIS — I714 Abdominal aortic aneurysm, without rupture, unspecified: Secondary | ICD-10-CM

## 2013-11-06 DIAGNOSIS — Z7982 Long term (current) use of aspirin: Secondary | ICD-10-CM | POA: Insufficient documentation

## 2013-11-06 DIAGNOSIS — I509 Heart failure, unspecified: Secondary | ICD-10-CM | POA: Insufficient documentation

## 2013-11-06 DIAGNOSIS — I2589 Other forms of chronic ischemic heart disease: Secondary | ICD-10-CM | POA: Insufficient documentation

## 2013-11-06 DIAGNOSIS — N189 Chronic kidney disease, unspecified: Secondary | ICD-10-CM | POA: Insufficient documentation

## 2013-11-06 DIAGNOSIS — Z87891 Personal history of nicotine dependence: Secondary | ICD-10-CM | POA: Insufficient documentation

## 2013-11-06 DIAGNOSIS — Z951 Presence of aortocoronary bypass graft: Secondary | ICD-10-CM | POA: Insufficient documentation

## 2013-11-06 DIAGNOSIS — N183 Chronic kidney disease, stage 3 unspecified: Secondary | ICD-10-CM

## 2013-11-06 DIAGNOSIS — E119 Type 2 diabetes mellitus without complications: Secondary | ICD-10-CM | POA: Insufficient documentation

## 2013-11-06 DIAGNOSIS — E785 Hyperlipidemia, unspecified: Secondary | ICD-10-CM | POA: Insufficient documentation

## 2013-11-06 DIAGNOSIS — Z79899 Other long term (current) drug therapy: Secondary | ICD-10-CM | POA: Insufficient documentation

## 2013-11-06 DIAGNOSIS — I658 Occlusion and stenosis of other precerebral arteries: Secondary | ICD-10-CM | POA: Insufficient documentation

## 2013-11-06 DIAGNOSIS — G4733 Obstructive sleep apnea (adult) (pediatric): Secondary | ICD-10-CM

## 2013-11-06 DIAGNOSIS — I6529 Occlusion and stenosis of unspecified carotid artery: Secondary | ICD-10-CM | POA: Insufficient documentation

## 2013-11-06 DIAGNOSIS — E663 Overweight: Secondary | ICD-10-CM | POA: Insufficient documentation

## 2013-11-06 DIAGNOSIS — J9589 Other postprocedural complications and disorders of respiratory system, not elsewhere classified: Secondary | ICD-10-CM | POA: Insufficient documentation

## 2013-11-06 DIAGNOSIS — D638 Anemia in other chronic diseases classified elsewhere: Secondary | ICD-10-CM | POA: Insufficient documentation

## 2013-11-06 DIAGNOSIS — Z954 Presence of other heart-valve replacement: Secondary | ICD-10-CM | POA: Insufficient documentation

## 2013-11-06 DIAGNOSIS — I5022 Chronic systolic (congestive) heart failure: Secondary | ICD-10-CM

## 2013-11-06 DIAGNOSIS — I2510 Atherosclerotic heart disease of native coronary artery without angina pectoris: Secondary | ICD-10-CM | POA: Insufficient documentation

## 2013-11-06 DIAGNOSIS — I5032 Chronic diastolic (congestive) heart failure: Secondary | ICD-10-CM | POA: Insufficient documentation

## 2013-11-06 NOTE — Progress Notes (Signed)
Patient ID: [name], male   DOB: 1941-02-10, 73 y.o.   MRN: [id number] PCP: Dr. [name] Nephrologist: Dr Deterding Vascular: Dr Kellie Simmering  73 yo with history of ARDS/respiratory failure and tracheostomy in 07/2012 as well as DM, HTN, and COPD presented to the ER at [facility] with CHF in 10/2012. Patient had a prolonged hospitalization in 07/2012 with H1N1 influenza and pneumococcal PNA. This progressed to ARDS. He was intubated and ended up with tracheostomy. He has had the trach removed. He also had a left empyema requiring chest tube, septic shock with elevated troponin, and AKI which resolved.  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   He was admitted again in 10/2012 from his nursing home with exertional dyspnea and orthopnea. He had gained 21 lbs. Echo at admission showed EF 35% with global hypokinesis that looked worse in the anteroseptal wall. He also was noted to have aortic stenosis rated as moderate to severe. He was diuresed and cathed, showing severe 3 vessel disease. He then had CABG-AVR (bioprosthetic valve).   He had a large AAA and underwent surgical repair in 07/2013.  He had AKI and Pseudomonas PNA post-operatively.  He was discharged to a nursing home. Echo in 1/15 showed EF 55-60% with mildly dilated and dysfunctional RV and a bioprosthetic aortic valve with mean gradient 32 mmHg (higher than expected).   Repeat limited echo in 08/2013 showed shower aortic valve mean gradient of 21 mmHg.   Follow up: Doing great. At home now and has no complaints. Denies SOB, orthopnea, CP or palpitations. + trace LE edema. Mowing his daughters lawn with no issues. Participating in [place] for 30 min 3 x a week with issues. No dizziness. Taking medications as prescribed. Following low salt diet and drinking less than 2L a day. No blood in stools.   Labs (5/14): K 3.6, creatinine 0.86, BNP 11508  Labs (6/14): K 4, creatinine 1.1 Labs (7/14): Creatinine  1.57 Potassium 4.3 Labs (8/14): TSH normal, BNP 1372 Labs (9/14): K 3.5, creatinine 1.5 Labs (12/14): K 3.5, creatinine 1.21 Labs (1/15): K 3.5, creatinine 3.35 Labs (2/15): K 4, creatinine 1.6, BNP 166 Labs (3/15): Hgb 11.6, pro-BNP 1037, creatinine 1.76, BUN 30  ROS: All systems negative except as listed in HPI, PMH and Problem List.  PMH: 1. PNA (H1N1 influenza + pneumococcus) in [id number] complicated by respiratory failure/ARDS. Required tracheostomy, now weaned off. Had left empyema requiring chest tube.  Has developed post-infectious pulmonary fibrosis.  2. Prostate CA s/p radiation treatment. Has indwelling foley.  3. OSA: using CPAP 4. HTN  5. Hyperlipidemia  6. COPD: History of heavy smoking. PFTs (4/14) with mixed obstructive (COPD) and restrictive (post-ARDS) picture.  7. Type II diabetes  8. Anemia of chronic disease.  9. Ischemic cardiomyopathy: Echo (1/14) with severely dilated LV, EF 30-35%, diffuse hypokinesis worse in the anteroseptal wall, grade II diastolic dysfunction, mild MR, AS interpreted as "moderate to severe" with aortic valve mean gradient 23 mmHg. Echo (4/14) witih EF 35-40%, mid to apical anteroseptal akinesis, moderate to severe AS with mean gradient 33 mmHg, AVA 0.91 cm^2. Echo (5/14) post CABG showed EF 50%, anteroseptal hypokinesis, bioprosthetic aortic valve well-seated. Echo (1/15) with EF 55-60%, mild LVH, mildly dilated RV with mildly decreased systolic function, D-shaped interventricular septum, bioprosthetic aortic valve with mean gardient 32 mmHg.  Echo (3/15) with EF 55-60%, mild LVH, bioprosthetic aortic valve with mean gradient 21 mmHg (lower), no AI, RV moderately dilated with mild to moderately  decreased systolic function.  10. Aortic stenosis: Moderate to severe by echo in 4/14. Bioprosthetic #21 [facility] Ease aortic valve replacement in 4/14.  11. CAD: LHC (4/14) with 95% pLAD, 70-80% ostial ramus, 70% mLCx, 90% pRCA, EF 35%. CABG 4/14 with  LIMA-LAD, seq SVG-ramus and OM1, SVG-PDA.  12. Carotid stenosis: Carotid dopplers (0000000) with A999333 LICA stenosis.  Carotids (123456) with 123456 LICA stenosis.  13. AAA: 5/14 CT showed > 6 cm AAA, also right iliac aneurysm. Not stent graft candidate.  7/14 CT showed 7 cm AAA.  Surgical repair 1/15.  14. CKD: AKI in 7/14 from contrast nephropathy.  AKI in 1/15 after AAA repair.  15. Contrast allergy.  16. ABIs 9/14 were normal.  17. Gout  SH: Quit smoking in 1/14, married, no ETOH, lives in [place].   FH: No premature CAD  ROS: All systems reviewed and negative except as per HPI.    Current Outpatient Prescriptions  Medication Sig Dispense Refill  . albuterol (PROVENTIL) (2.5 MG/3ML) 0.083% nebulizer solution Take 3 mLs (2.5 mg total) by nebulization every 2 (two) hours as needed for wheezing or shortness of breath.      . allopurinol (ZYLOPRIM) 300 MG tablet Take 300 mg by mouth daily.      Marland Kitchen aspirin EC 325 MG EC tablet Take 1 tablet (325 mg total) by mouth daily.  30 tablet    . atorvastatin (LIPITOR) 80 MG tablet Take 1 tablet (80 mg total) by mouth daily before breakfast.  30 tablet  6  . bisoprolol (ZEBETA) 5 MG tablet Take 2.5 mg by mouth daily.      . budesonide (PULMICORT) 0.25 MG/2ML nebulizer solution Take 2 mLs (0.25 mg total) by nebulization 2 (two) times daily. Dx 496  120 mL  12  . clonazePAM (KLONOPIN) 0.5 MG tablet Take 1 tablet (0.5 mg total) by mouth 2 (two) times daily as needed (anxiety).  60 tablet  3  . furosemide (LASIX) 80 MG tablet Take 80 mg by mouth 2 (two) times daily.       . nitroGLYCERIN (NITROSTAT) 0.4 MG SL tablet Place 1 tablet (0.4 mg total) under the tongue every 5 (five) minutes as needed for chest pain.  25 tablet  12  . pantoprazole (PROTONIX) 40 MG tablet Take 40 mg by mouth at bedtime.       . potassium chloride SA (K-DUR,KLOR-CON) 20 MEQ tablet Take 20 mEq by mouth 2 (two) times daily.      . tamsulosin (FLOMAX) 0.4 MG CAPS capsule Take 0.4 mg by  mouth at bedtime.       . Vitamin D, Ergocalciferol, (DRISDOL) 50000 UNITS CAPS capsule Take 50,000 Units by mouth every 7 (seven) days.       No current facility-administered medications for this encounter.    Filed Vitals:   11/06/13 1521  BP: 96/58  Pulse: 87  Resp: 18  Weight: 216 lb (97.977 kg)  SpO2: 97%   PHYSICAL EXAM: General:  NAD, overweight, wife present HEENT: normal Neck: Thick, JVP 7. Carotids 2+ bilaterally; no bruits. No lymphadenopathy or thryomegaly appreciated. Cor: PMI normal. Regular rate & rhythm. No rubs, gallops. 1/6 SEM RUSB.  Lungs: Slight crackles at bases.  Abdomen: soft, nontender, nondistended. No hepatosplenomegaly. No bruits or masses. Good bowel sounds. Extremities: no cyanosis, clubbing, rash, no edema. Neuro: alert & orientedx3, cranial nerves grossly intact. Moves all 4 extremities w/o difficulty. Affect pleasant.  ASSESSMENT & PLAN: 1. CAD: Status post CABG for 3VD. No  chest pain.  - Continue ASA, statin, and bisoprolol.  2. Chronic diastolic CHF: Last EF improved to 55-60% (08/2013). Stable NYHA class II symptoms and volume status stable. Will continue lasix 80 mg BID. Had BMET checked at nephrology today.  3. 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 I suspect that intrinsic lung disease (post-infectious pulmonary fibrosis) is a contributor to his dyspnea and hypoxemia. He is actually now off oxygen (just using CPAP at night).  4. Carotid stenosis: Followed at [facility]. 5. Hyperlipidemia: Continue statin.   6. AAA: s/p surgical repair. Will have repeat CT at [facility] in January.  7. Status post bioprosthetic AVR: Gradient across the aortic valve was elevated in 1/15.  However, this was around the time of his AAA surgery and may be reflective of high flow.  Repeat echo in 3/15 showed lower gradient, 21 mmHg.  8. CKD: Recent BMET with Dr. Jimmy Footman. Will continue to follow 9. Anemia: Last Hgb 11.6 and no s/s of bleeding.    F/U  3 months Rande Brunt 11/06/2013

## 2013-11-06 NOTE — Patient Instructions (Signed)
Doing great.  Call any issues.  Follow up in 3 months.  Do the following things EVERYDAY: 1) Weigh yourself in the morning before breakfast. Write it down and keep it in a log. 2) Take your medicines as prescribed 3) Eat low salt foods-Limit salt (sodium) to 2000 mg per day.  4) Stay as active as you can everyday 5) Limit all fluids for the day to less than 2 liters 6)

## 2013-11-07 ENCOUNTER — Ambulatory Visit: Payer: Medicare Other | Admitting: Cardiology

## 2013-12-13 ENCOUNTER — Other Ambulatory Visit (HOSPITAL_COMMUNITY): Payer: Self-pay | Admitting: Cardiology

## 2014-01-11 ENCOUNTER — Other Ambulatory Visit (HOSPITAL_COMMUNITY): Payer: Self-pay | Admitting: Cardiology

## 2014-01-21 IMAGING — CR DG CHEST 1V PORT
1 series · 1 of 1 positions shown · non-contrast
Comparison: 03/31/2013

CLINICAL DATA: Post thoracentesis.

PORTABLE CHEST - 1 VIEW

[view not recorded]
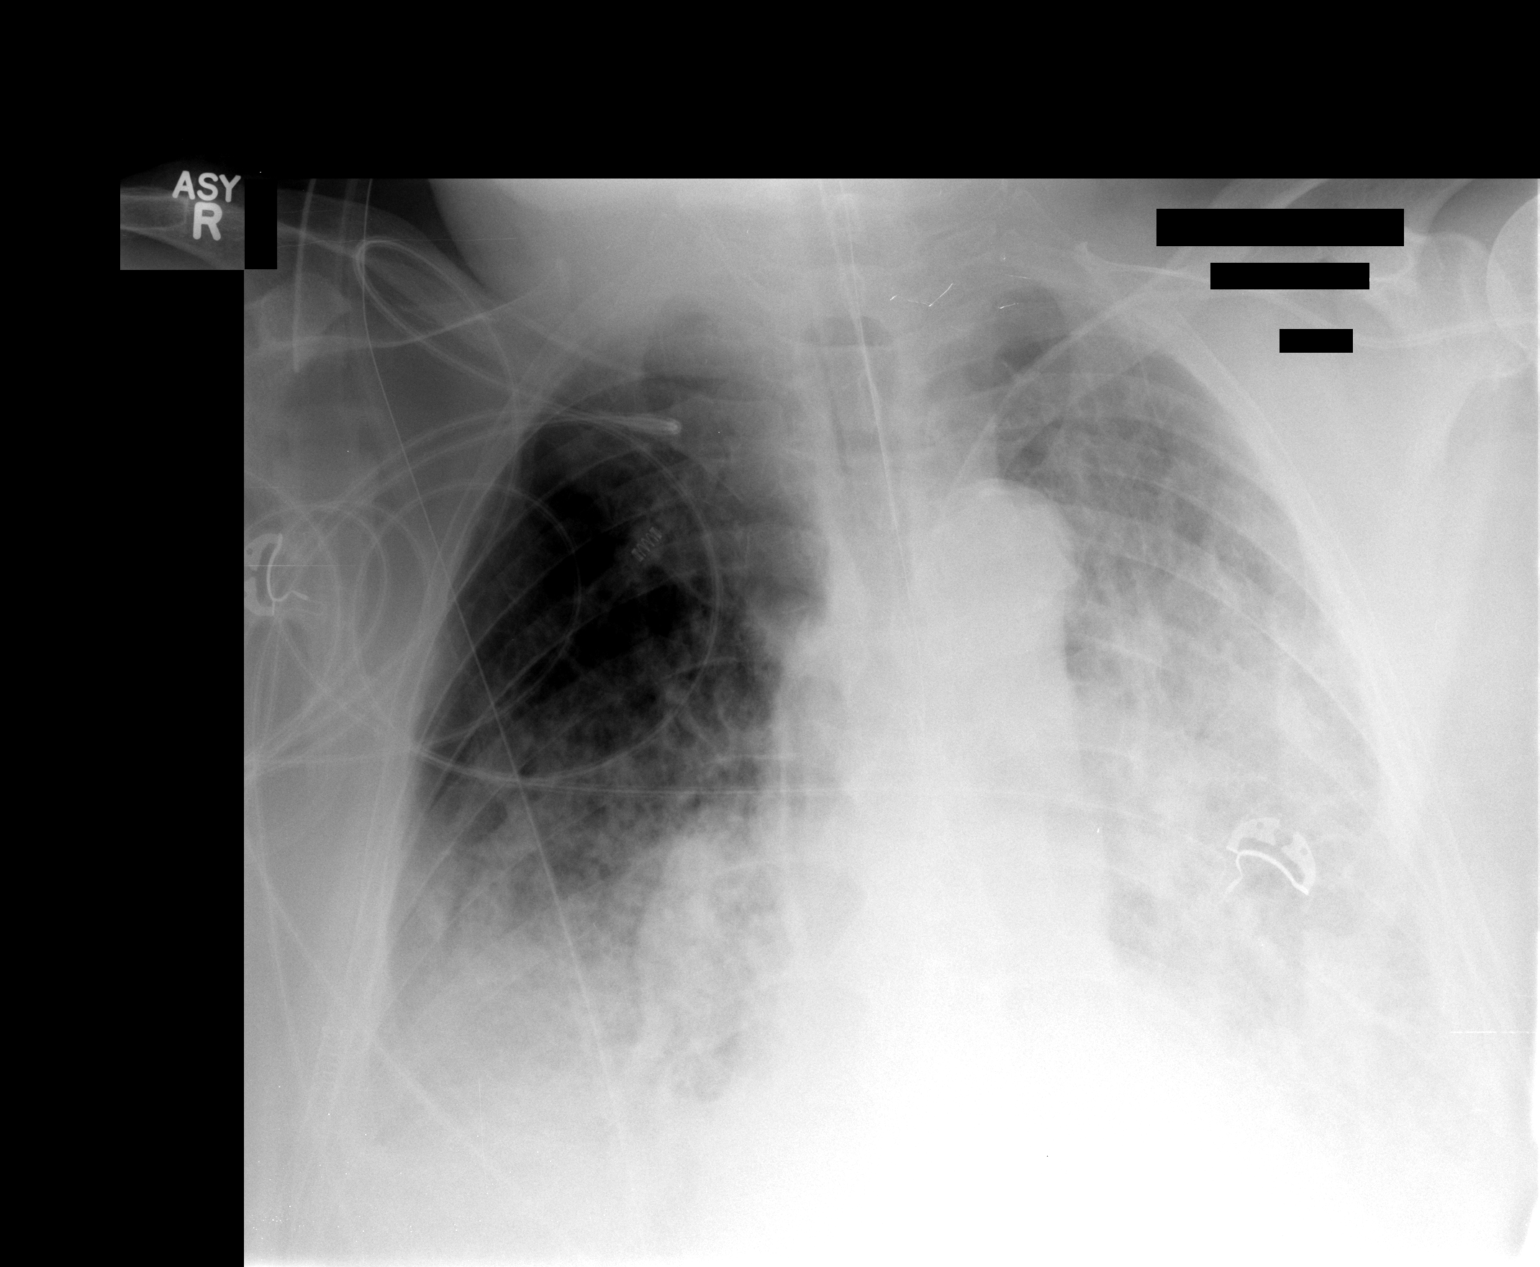

[1 of 1 positions shown; findings below may reference images not displayed]

FINDINGS: 9055 hours.  Endotracheal tube tip is 5.2 cm above the
base of the carina.  No evidence for pneumothorax status post
thoracentesis.  There is bibasilar atelectasis/infiltrate.
Probable associated small bilateral pleural effusions.
Cardiopericardial silhouette is at upper limits of normal for size.
Left subclavian central line tip overlies the central mediastinum,
cranial to the carina. Telemetry leads overlie the chest.
IMPRESSION: No evidence for pneumothorax status post thoracentesis.

## 2014-01-21 IMAGING — CR DG CHEST 1V PORT
1 series · 1 of 1 positions shown · non-contrast
Comparison: none

CLINICAL DATA: Respiratory failure, evaluate endotracheal tube
positioning

PORTABLE CHEST - 1 VIEW

[AP]
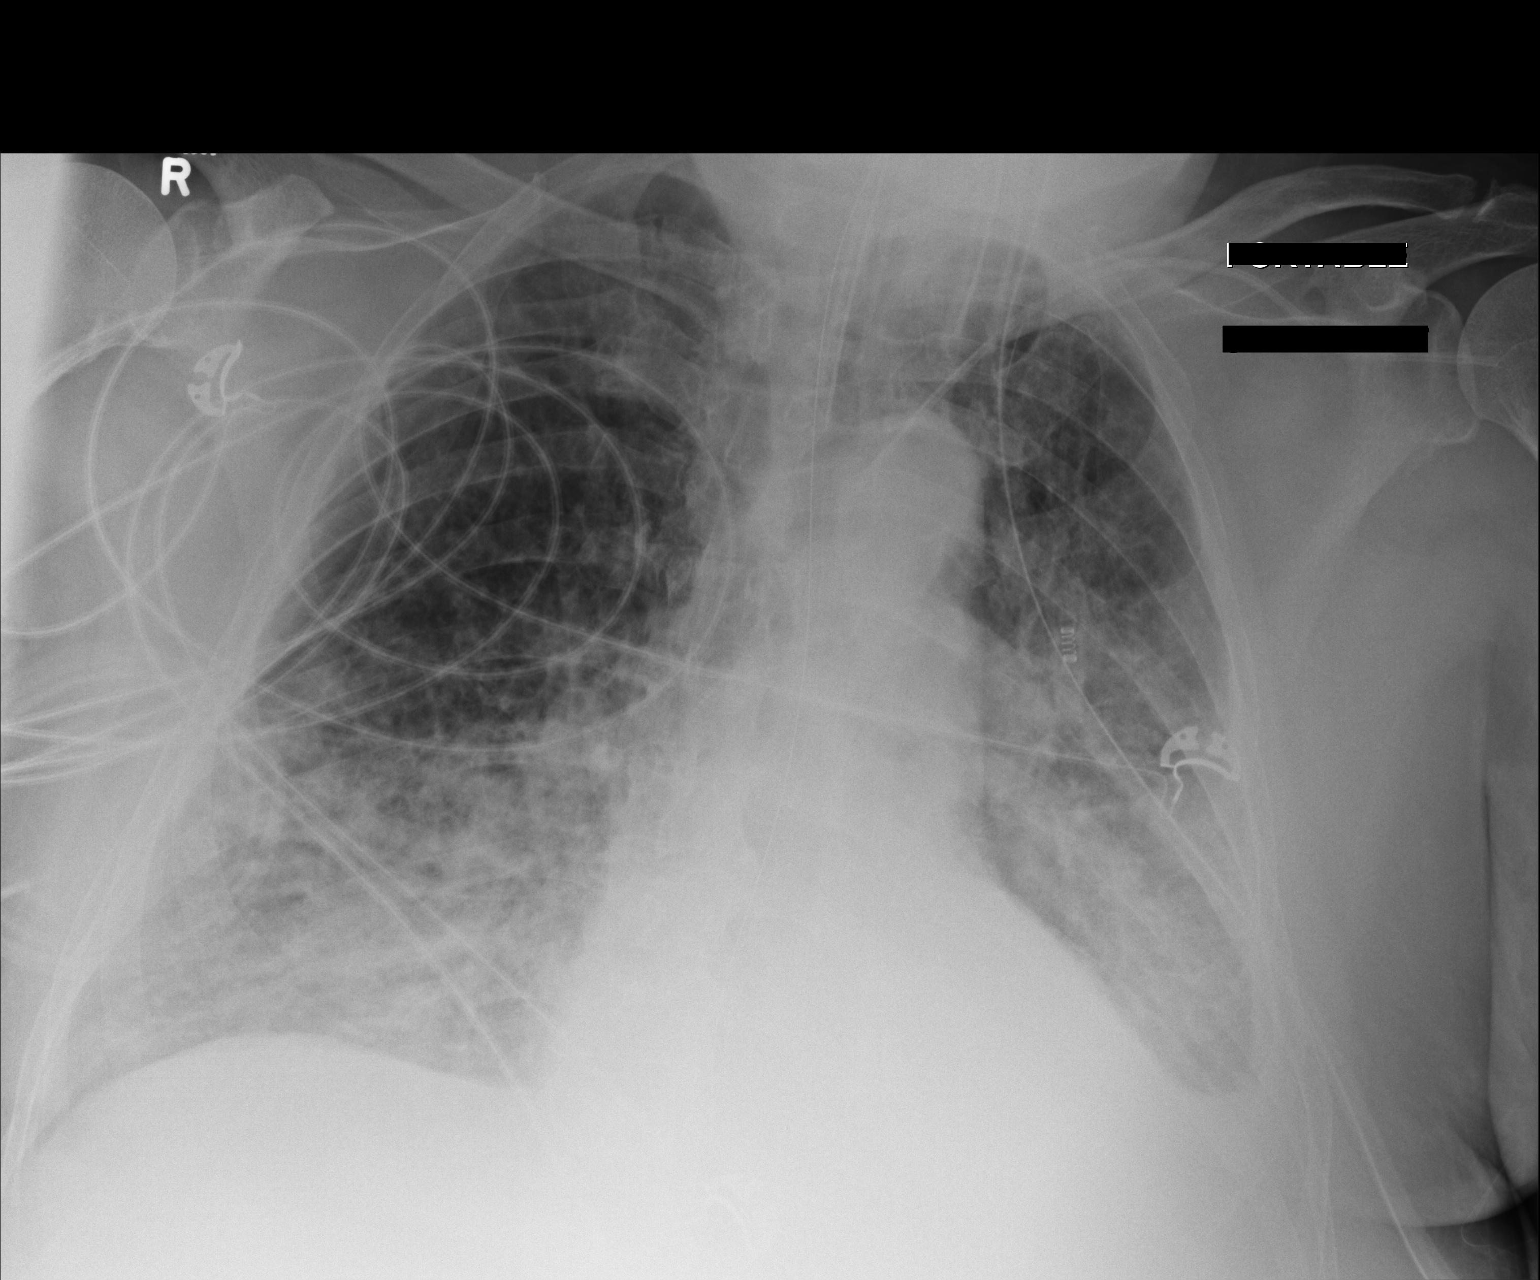

[1 of 1 positions shown; findings below may reference images not displayed]

FINDINGS: Grossly unchanged cardiac silhouette and mediastinal
contours.  Stable positioning of support apparatus including left
subclavian vein approach central venous catheter tip projecting
over the central aspect of the left innominate vein.  Grossly
unchanged small bilateral pleural effusions and associated
bibasilar opacities, left greater than right.  Pulmonary
vasculature remains indistinct with cephalization of flow.  No
pneumothorax.  Unchanged bones.
IMPRESSION: 1.  Stable positioning of support apparatus.  No pneumothorax.
2.  Grossly unchanged findings suggestive of pulmonary edema, small
bilateral effusions and bibasilar opacities, left greater than
right, atelectasis versus infiltrate.

## 2014-01-22 IMAGING — CR DG CHEST 1V PORT
2 series · 2 of 2 positions shown · non-contrast
Comparison: 08/01/2012.

CLINICAL DATA: Chest tube.

PORTABLE CHEST - 1 VIEW

[AP (1 of 2)]
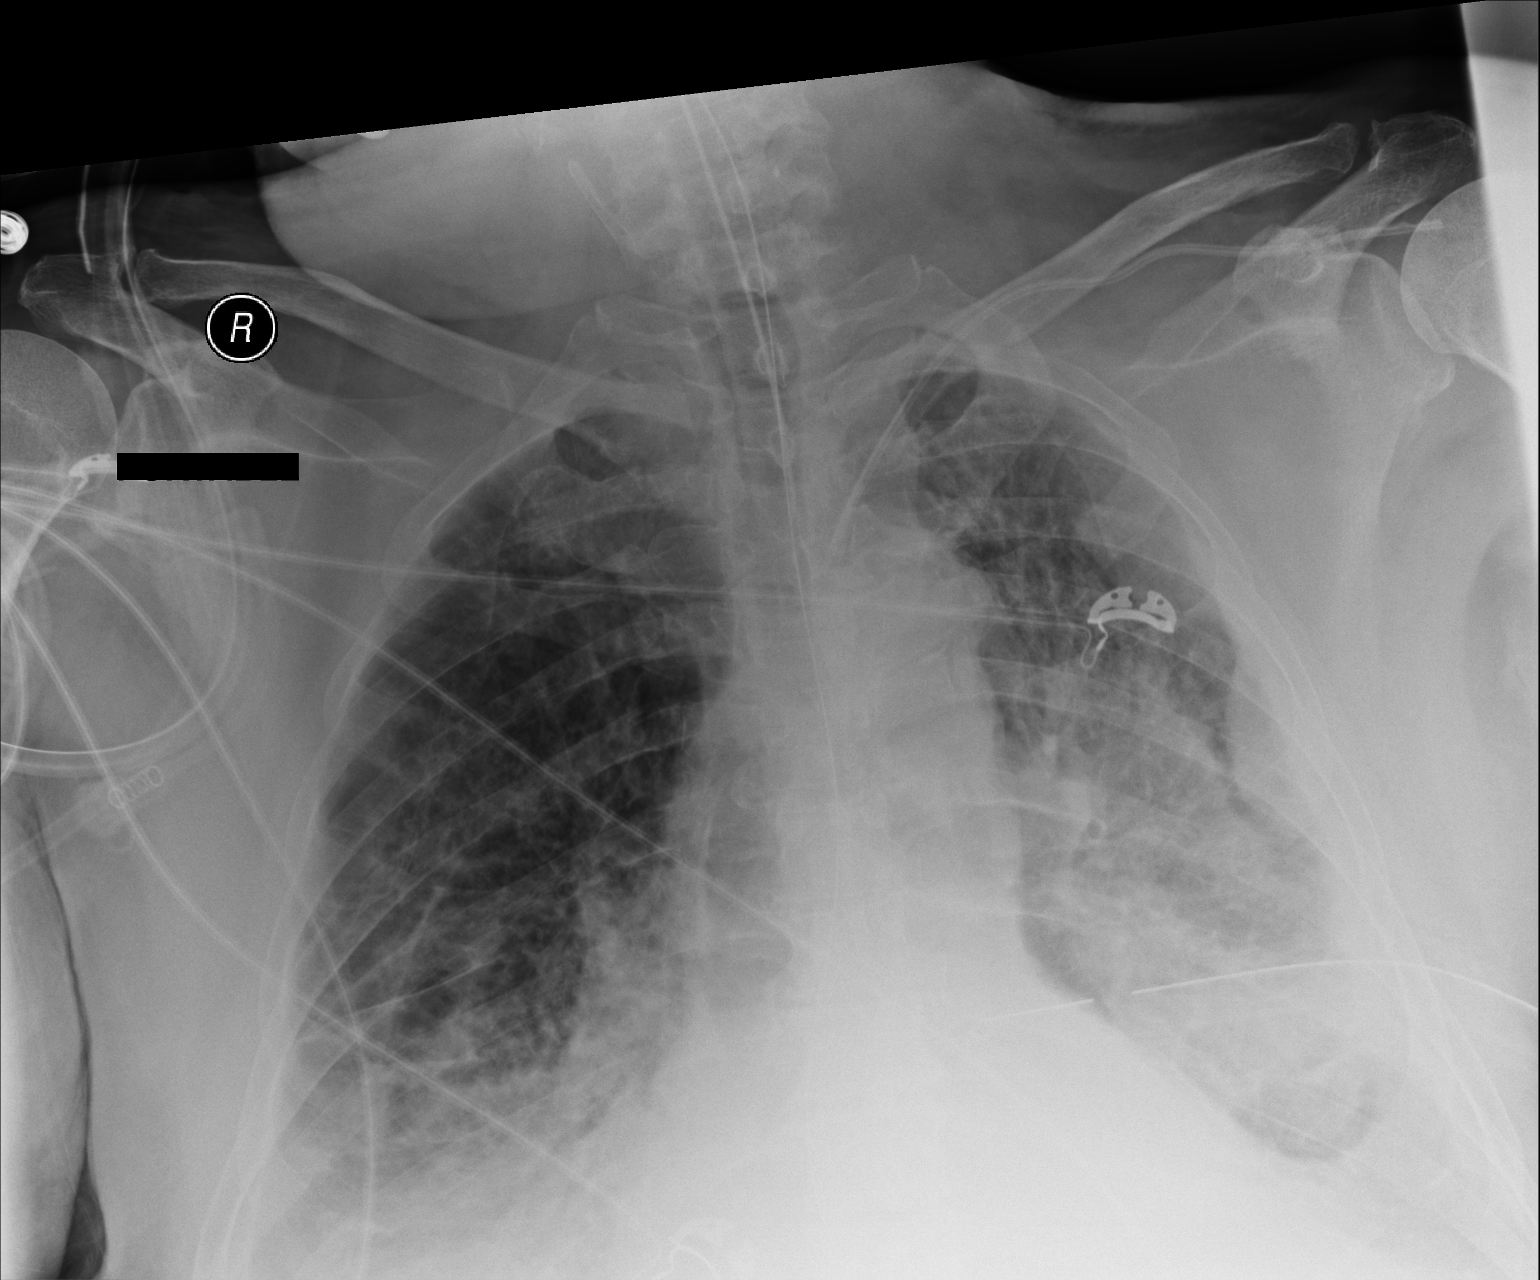

[AP (2 of 2)]
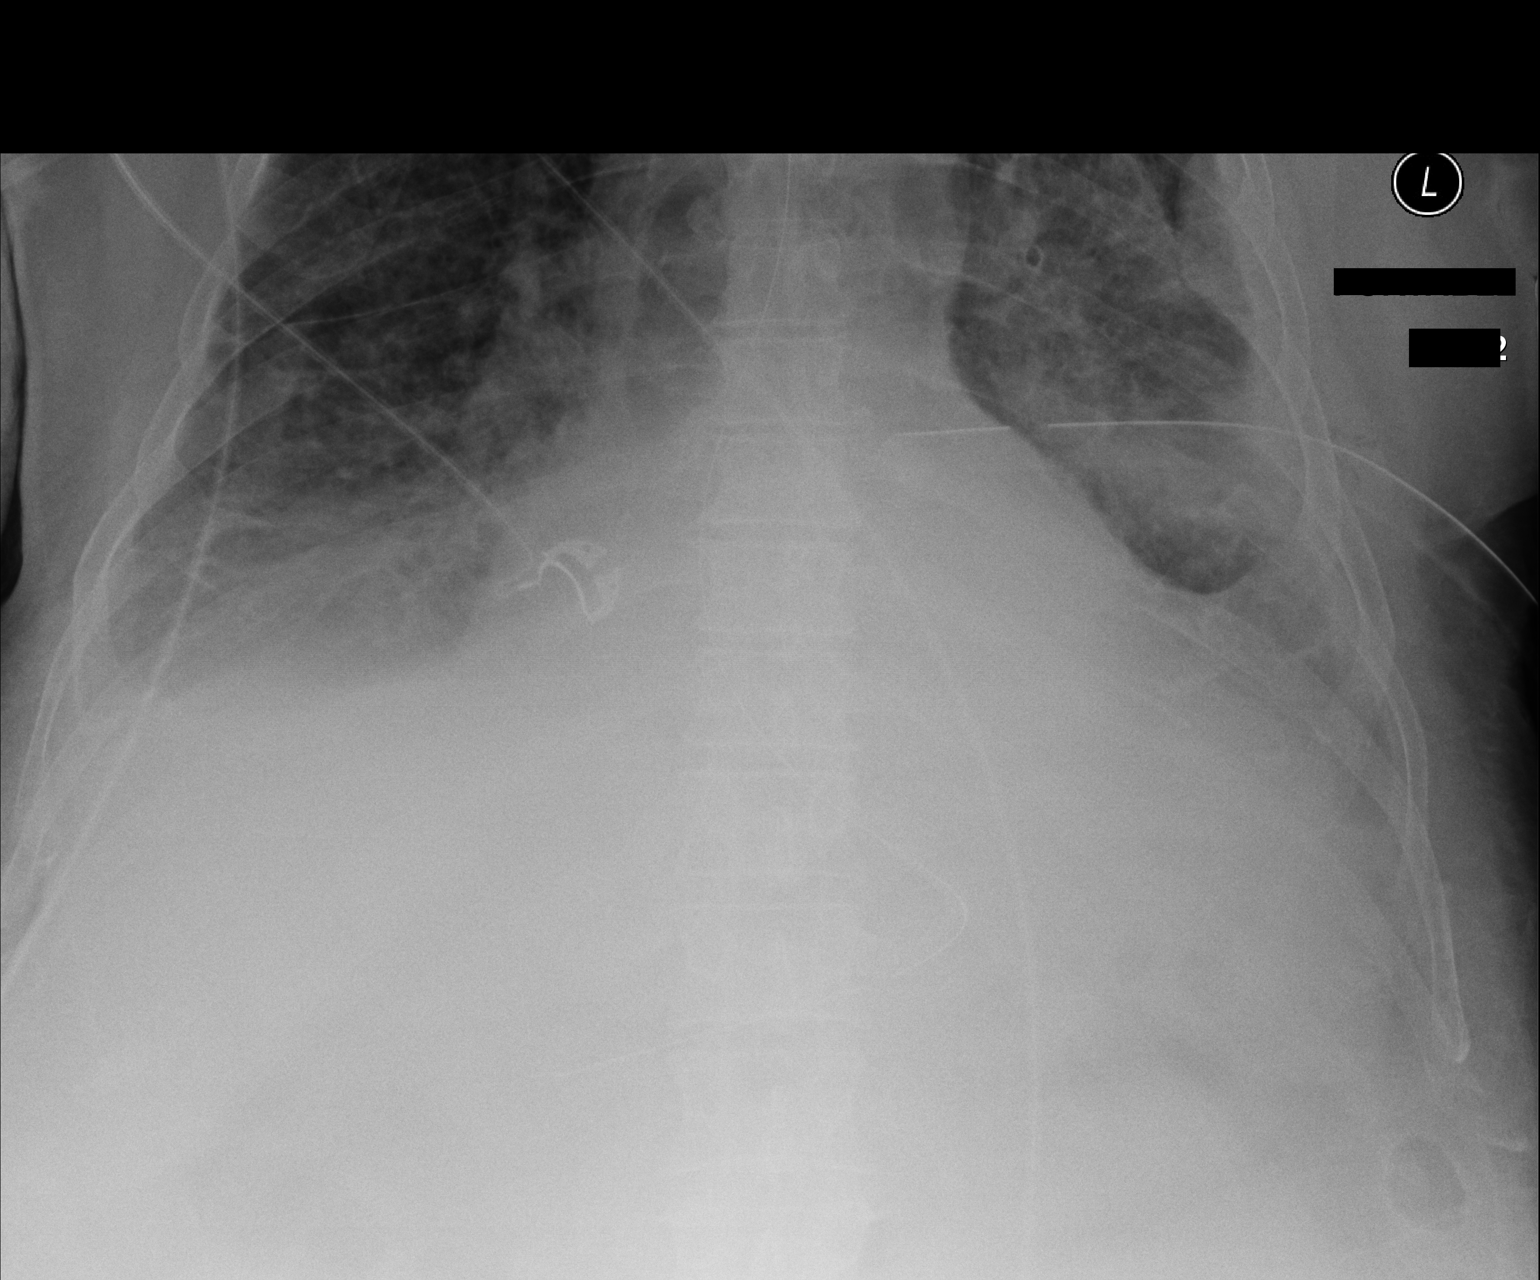

[2 of 2 positions shown; findings below may reference images not displayed]

FINDINGS: Endotracheal tube tip is 7.4 cm from the carina.  There
is a new left basilar thoracostomy tube.  There is no pneumothorax
identified.  The left subclavian line and enteric tube appear
unchanged.  Evacuation of some of the left pleural effusion with
improving aeration of the left lung compared to recent priors.
Right pleural effusion remains present.  Bilateral airspace disease
and atelectasis.

Loculated pleural fluid is present along the left mid chest.
IMPRESSION: New left basilar thoracostomy tube.  No pneumothorax.  Evacuation
of some of the left pleural effusion. Other support apparatus
unchanged.

## 2014-01-22 IMAGING — CR DG CHEST 1V PORT
1 series · 1 of 1 positions shown · non-contrast
Comparison: Chest x-ray 07/31/2012.

CLINICAL DATA: Intubated patient.  Edema.  Shortness of breath.

PORTABLE CHEST - 1 VIEW

[AP]
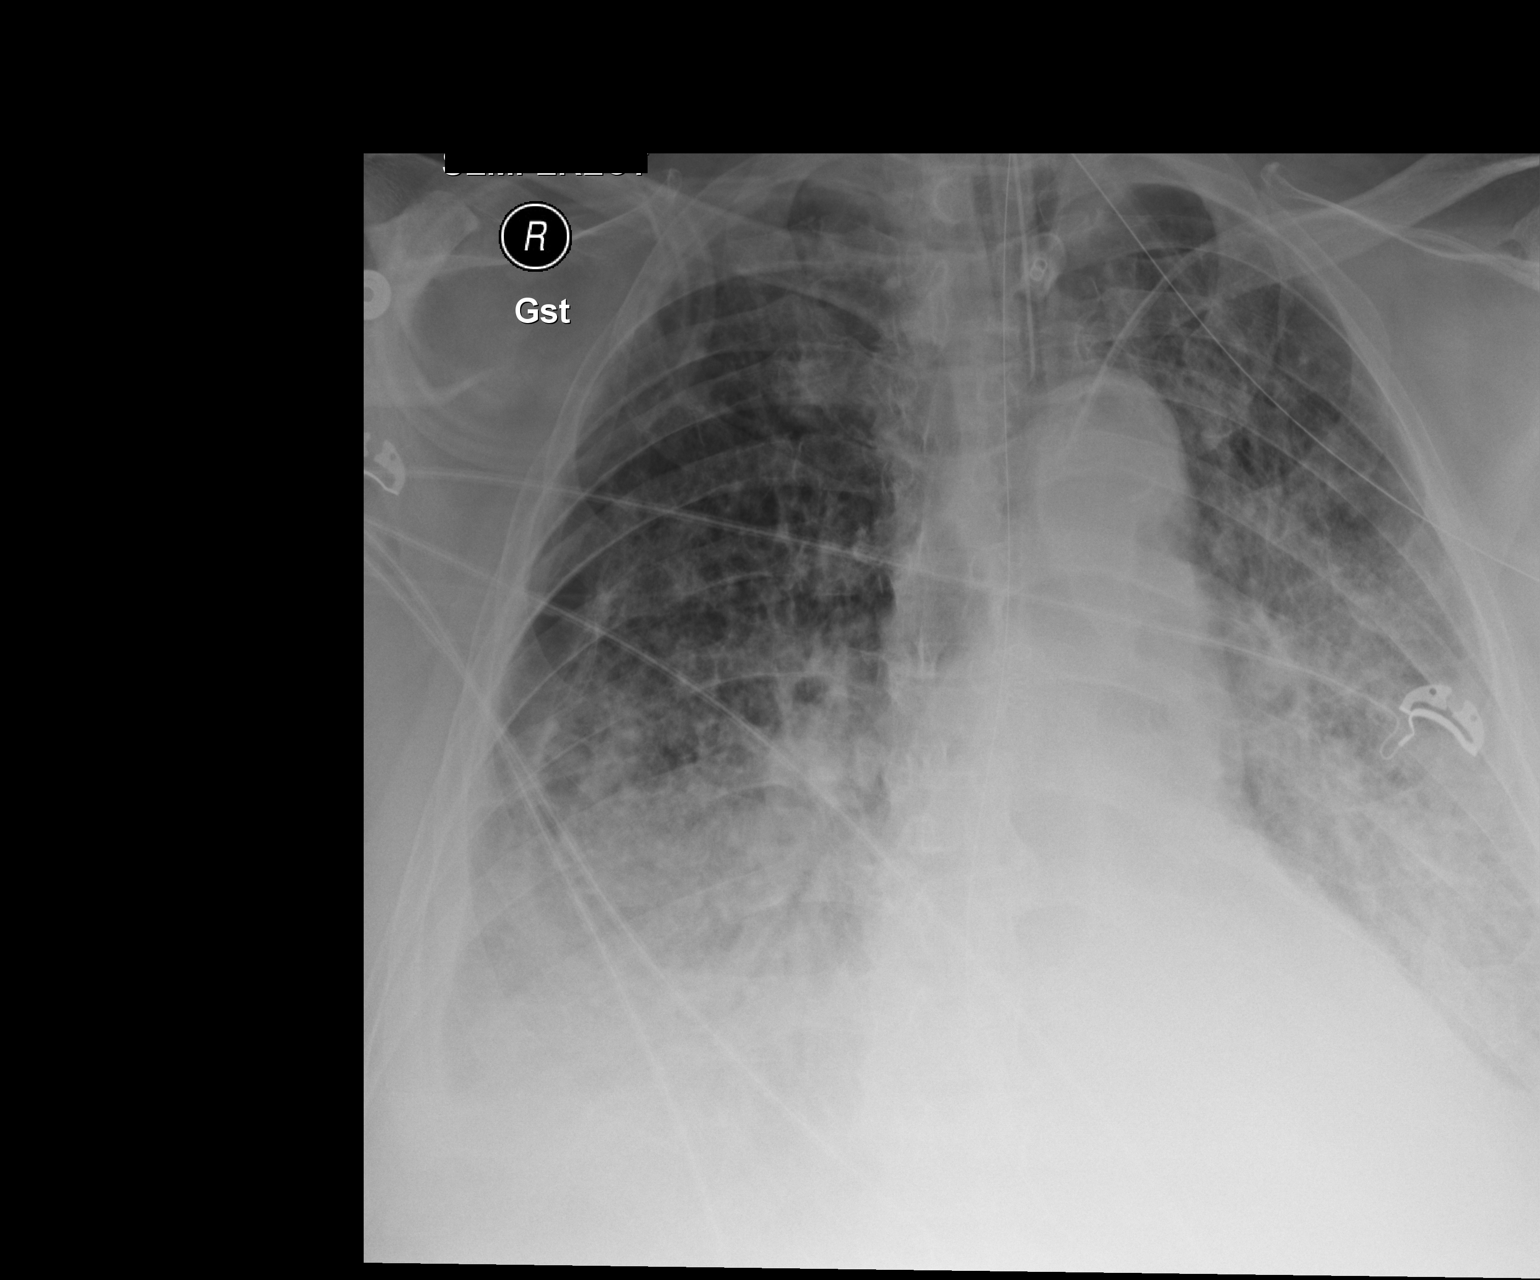

[1 of 1 positions shown; findings below may reference images not displayed]

FINDINGS: An endotracheal tube is in place with tip 6.4 cm above
the carina. A nasogastric tube is seen extending into the stomach,
however, the tip of the nasogastric tube extends below the lower
margin of the image. There is a left-sided subclavian central
venous catheter with tip terminating in the distal left innominate
vein. Extensive bibasilar opacities may reflect areas of
atelectasis and/or consolidation. There is cephalization of the
pulmonary vasculature and slight indistinctness of the interstitial
markings suggestive of mild pulmonary edema.  Mild cardiomegaly.
Moderate bilateral pleural effusions.  Atherosclerosis in the
thoracic aorta.
IMPRESSION: 1.  Allowing for slight differences in patient positioning, the
radiographic appearance of the chest is essentially unchanged, as
above.

## 2014-01-27 ENCOUNTER — Encounter (HOSPITAL_COMMUNITY): Payer: Self-pay | Admitting: Vascular Surgery

## 2014-02-16 ENCOUNTER — Other Ambulatory Visit (HOSPITAL_COMMUNITY): Payer: Self-pay | Admitting: Cardiology

## 2014-02-24 ENCOUNTER — Encounter (HOSPITAL_COMMUNITY): Payer: Self-pay

## 2014-02-24 ENCOUNTER — Ambulatory Visit (HOSPITAL_COMMUNITY)
Admission: RE | Admit: 2014-02-24 | Discharge: 2014-02-24 | Disposition: A | Discharge status: Home / Self Care | Payer: Medicare Other | Source: Ambulatory Visit | Attending: Cardiology | Admitting: Cardiology

## 2014-02-24 VITALS — BP 132/80 | HR 88 | Wt 221.1 lb

## 2014-02-24 DIAGNOSIS — E785 Hyperlipidemia, unspecified: Secondary | ICD-10-CM | POA: Insufficient documentation

## 2014-02-24 DIAGNOSIS — Z953 Presence of xenogenic heart valve: Secondary | ICD-10-CM

## 2014-02-24 DIAGNOSIS — N183 Chronic kidney disease, stage 3 unspecified: Secondary | ICD-10-CM

## 2014-02-24 DIAGNOSIS — Z7982 Long term (current) use of aspirin: Secondary | ICD-10-CM | POA: Insufficient documentation

## 2014-02-24 DIAGNOSIS — Z954 Presence of other heart-valve replacement: Secondary | ICD-10-CM | POA: Diagnosis not present

## 2014-02-24 DIAGNOSIS — J449 Chronic obstructive pulmonary disease, unspecified: Secondary | ICD-10-CM | POA: Diagnosis not present

## 2014-02-24 DIAGNOSIS — I2510 Atherosclerotic heart disease of native coronary artery without angina pectoris: Secondary | ICD-10-CM | POA: Diagnosis not present

## 2014-02-24 DIAGNOSIS — Z8701 Personal history of pneumonia (recurrent): Secondary | ICD-10-CM | POA: Insufficient documentation

## 2014-02-24 DIAGNOSIS — G4733 Obstructive sleep apnea (adult) (pediatric): Secondary | ICD-10-CM | POA: Diagnosis not present

## 2014-02-24 DIAGNOSIS — M109 Gout, unspecified: Secondary | ICD-10-CM | POA: Insufficient documentation

## 2014-02-24 DIAGNOSIS — I5042 Chronic combined systolic (congestive) and diastolic (congestive) heart failure: Secondary | ICD-10-CM | POA: Insufficient documentation

## 2014-02-24 DIAGNOSIS — I359 Nonrheumatic aortic valve disorder, unspecified: Secondary | ICD-10-CM | POA: Diagnosis not present

## 2014-02-24 DIAGNOSIS — I129 Hypertensive chronic kidney disease with stage 1 through stage 4 chronic kidney disease, or unspecified chronic kidney disease: Secondary | ICD-10-CM | POA: Diagnosis not present

## 2014-02-24 DIAGNOSIS — C61 Malignant neoplasm of prostate: Secondary | ICD-10-CM | POA: Diagnosis not present

## 2014-02-24 DIAGNOSIS — Z87891 Personal history of nicotine dependence: Secondary | ICD-10-CM | POA: Insufficient documentation

## 2014-02-24 DIAGNOSIS — I509 Heart failure, unspecified: Secondary | ICD-10-CM | POA: Insufficient documentation

## 2014-02-24 DIAGNOSIS — J9610 Chronic respiratory failure, unspecified whether with hypoxia or hypercapnia: Secondary | ICD-10-CM | POA: Diagnosis not present

## 2014-02-24 DIAGNOSIS — I714 Abdominal aortic aneurysm, without rupture, unspecified: Secondary | ICD-10-CM | POA: Diagnosis not present

## 2014-02-24 DIAGNOSIS — I2589 Other forms of chronic ischemic heart disease: Secondary | ICD-10-CM | POA: Diagnosis not present

## 2014-02-24 DIAGNOSIS — N039 Chronic nephritic syndrome with unspecified morphologic changes: Secondary | ICD-10-CM

## 2014-02-24 DIAGNOSIS — Z91041 Radiographic dye allergy status: Secondary | ICD-10-CM | POA: Insufficient documentation

## 2014-02-24 DIAGNOSIS — D631 Anemia in chronic kidney disease: Secondary | ICD-10-CM | POA: Diagnosis not present

## 2014-02-24 DIAGNOSIS — I5022 Chronic systolic (congestive) heart failure: Secondary | ICD-10-CM

## 2014-02-24 DIAGNOSIS — I6529 Occlusion and stenosis of unspecified carotid artery: Secondary | ICD-10-CM | POA: Insufficient documentation

## 2014-02-24 DIAGNOSIS — N189 Chronic kidney disease, unspecified: Secondary | ICD-10-CM | POA: Diagnosis not present

## 2014-02-24 DIAGNOSIS — J4489 Other specified chronic obstructive pulmonary disease: Secondary | ICD-10-CM | POA: Insufficient documentation

## 2014-02-24 DIAGNOSIS — Z952 Presence of prosthetic heart valve: Secondary | ICD-10-CM

## 2014-02-24 DIAGNOSIS — J8410 Pulmonary fibrosis, unspecified: Secondary | ICD-10-CM

## 2014-02-24 LAB — BASIC METABOLIC PANEL
Anion gap: 10 (ref 5–15)
BUN: 29 mg/dL — AB (ref 6–23)
CALCIUM: 9.5 mg/dL (ref 8.4–10.5)
CHLORIDE: 102 meq/L (ref 96–112)
CO2: 29 meq/L (ref 19–32)
CREATININE: 1.49 mg/dL — AB (ref 0.50–1.35)
GFR calc Af Amer: 53 mL/min — ABNORMAL LOW (ref >90)
GFR calc non Af Amer: 45 mL/min — ABNORMAL LOW (ref >90)
GLUCOSE: 94 mg/dL (ref 70–99)
Potassium: 4.2 mEq/L (ref 3.7–5.3)
Sodium: 141 mEq/L (ref 137–147)

## 2014-02-24 LAB — PRO B NATRIURETIC PEPTIDE: Pro B Natriuretic peptide (BNP): 387.1 pg/mL — ABNORMAL HIGH (ref 0–125)

## 2014-02-24 NOTE — Patient Instructions (Signed)
Labs today  Your physician recommends that you schedule a follow-up appointment in: 4 months

## 2014-02-25 NOTE — Progress Notes (Signed)
Patient ID: [name], male   DOB: 1941/01/09, 73 y.o.   MRN: [id number] PCP: Dr. [name] Nephrologist: Dr Deterding Vascular: Dr Kellie Simmering  73 yo with history of ARDS/respiratory failure and tracheostomy in 07/2012 as well as DM, HTN, and COPD presented to the ER at [facility] with CHF in 10/2012. Patient had a prolonged hospitalization in 07/2012 with H1N1 influenza and pneumococcal PNA. This progressed to ARDS. He was intubated and ended up with tracheostomy. He has had the trach removed. He also had a left empyema requiring chest tube, septic shock with elevated troponin, and AKI which resolved.  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   He was admitted again in 10/2012 from his nursing home with exertional dyspnea and orthopnea. He had gained 21 lbs. Echo at admission showed EF 35% with global hypokinesis that looked worse in the anteroseptal wall. He also was noted to have aortic stenosis rated as moderate to severe. He was diuresed and cathed, showing severe 3 vessel disease. He then had CABG-AVR (bioprosthetic valve).   He had a large AAA and underwent surgical repair in 07/2013.  He had AKI and Pseudomonas PNA post-operatively.  He was discharged to a nursing home. Echo in 1/15 showed EF 55-60% with mildly dilated and dysfunctional RV and a bioprosthetic aortic valve with mean gradient 32 mmHg (higher than expected).   Repeat limited echo in 08/2013 showed shower aortic valve mean gradient of 21 mmHg.   Follow up: Doing great. Denies SOB, orthopnea, CP or palpitations. He rides a stationary bike at the gym 4 days/week for 40 minutes.  He can push mow his yard.  He is using CPAP. No dizziness. Taking medications as prescribed. Following low salt diet and drinking less than 2L a day. No blood in stools. Main complaint is bilateral hip pain.   Labs (5/14): K 3.6, creatinine 0.86, BNP 11508  Labs (6/14): K 4, creatinine 1.1 Labs (7/14): Creatinine 1.57 Potassium 4.3 Labs  (8/14): TSH normal, BNP 1372 Labs (9/14): K 3.5, creatinine 1.5 Labs (12/14): K 3.5, creatinine 1.21 Labs (1/15): K 3.5, creatinine 3.35 Labs (2/15): K 4, creatinine 1.6, BNP 166 Labs (3/15): Hgb 11.6, pro-BNP 1037, creatinine 1.76, BUN 30, LDL 75, HDL 26  ROS: All systems negative except as listed in HPI, PMH and Problem List.  PMH: 1. PNA (H1N1 influenza + pneumococcus) in [id number] complicated by respiratory failure/ARDS. Required tracheostomy, now weaned off. Had left empyema requiring chest tube.  Has developed post-infectious pulmonary fibrosis.  2. Prostate CA s/p radiation treatment. Has indwelling foley.  3. OSA: using CPAP 4. HTN  5. Hyperlipidemia  6. COPD: History of heavy smoking. PFTs (4/14) with mixed obstructive (COPD) and restrictive (post-ARDS) picture.  7. Type II diabetes  8. Anemia of chronic disease.  9. Ischemic cardiomyopathy: Echo (1/14) with severely dilated LV, EF 30-35%, diffuse hypokinesis worse in the anteroseptal wall, grade II diastolic dysfunction, mild MR, AS interpreted as "moderate to severe" with aortic valve mean gradient 23 mmHg. Echo (4/14) witih EF 35-40%, mid to apical anteroseptal akinesis, moderate to severe AS with mean gradient 33 mmHg, AVA 0.91 cm^2. Echo (5/14) post CABG showed EF 50%, anteroseptal hypokinesis, bioprosthetic aortic valve well-seated. Echo (1/15) with EF 55-60%, mild LVH, mildly dilated RV with mildly decreased systolic function, D-shaped interventricular septum, bioprosthetic aortic valve with mean gardient 32 mmHg.  Echo (3/15) with EF 55-60%, mild LVH, bioprosthetic aortic valve with mean gradient 21 mmHg (lower), no AI, RV moderately dilated with mild  to moderately decreased systolic function.  10. Aortic stenosis: Moderate to severe by echo in 4/14. Bioprosthetic #21 [facility] Ease aortic valve replacement in 4/14.  11. CAD: LHC (4/14) with 95% pLAD, 70-80% ostial ramus, 70% mLCx, 90% pRCA, EF 35%. CABG 4/14 with LIMA-LAD, seq  SVG-ramus and OM1, SVG-PDA.  12. Carotid stenosis: Carotid dopplers (0000000) with [id number] LICA stenosis.  Carotids (123456) with 123456 LICA stenosis.  13. AAA: 5/14 CT showed > 6 cm AAA, also right iliac aneurysm. Not stent graft candidate.  7/14 CT showed 7 cm AAA.  Surgical repair 1/15.  14. CKD: AKI in 7/14 from contrast nephropathy.  AKI in 1/15 after AAA repair.  15. Contrast allergy.  16. ABIs 9/14 were normal.  17. Gout  SH: Quit smoking in 1/14, married, no ETOH, lives in [place].   FH: No premature CAD  ROS: All systems reviewed and negative except as per HPI.    Current Outpatient Prescriptions  Medication Sig Dispense Refill  . albuterol (PROVENTIL) (2.5 MG/3ML) 0.083% nebulizer solution Take 3 mLs (2.5 mg total) by nebulization every 2 (two) hours as needed for wheezing or shortness of breath.      . allopurinol (ZYLOPRIM) 100 MG tablet Take 100 mg by mouth daily. At nighttime to equal 400 daily      . allopurinol (ZYLOPRIM) 300 MG tablet Take 300 mg by mouth daily.      Marland Kitchen aspirin EC 325 MG EC tablet Take 1 tablet (325 mg total) by mouth daily.  30 tablet    . atorvastatin (LIPITOR) 80 MG tablet Take 1 tablet (80 mg total) by mouth daily before breakfast.  30 tablet  6  . bisoprolol (ZEBETA) 5 MG tablet Take 2.5 mg by mouth daily.      . budesonide (PULMICORT) 0.25 MG/2ML nebulizer solution Take 2 mLs (0.25 mg total) by nebulization 2 (two) times daily. Dx 496  120 mL  12  . calcitRIOL (ROCALTROL) 0.25 MCG capsule Take 0.25 mcg by mouth every other day.      . furosemide (LASIX) 80 MG tablet TAKE 1 TABLET BY MOUTH TWICE A DAY  60 tablet  0  . nitroGLYCERIN (NITROSTAT) 0.4 MG SL tablet Place 1 tablet (0.4 mg total) under the tongue every 5 (five) minutes as needed for chest pain.  25 tablet  12  . pantoprazole (PROTONIX) 40 MG tablet Take 40 mg by mouth at bedtime.       . potassium chloride SA (K-DUR,KLOR-CON) 20 MEQ tablet Take 20 mEq by mouth 2 (two) times daily.      .  tamsulosin (FLOMAX) 0.4 MG CAPS capsule Take 0.4 mg by mouth at bedtime.        No current facility-administered medications for this encounter.    Filed Vitals:   02/24/14 1528  BP: 132/80  Pulse: 88  Weight: 221 lb 1.9 oz (100.299 kg)  SpO2: 95%   PHYSICAL EXAM: General:  NAD, overweight, wife present HEENT: normal Neck: Thick, JVP 7. Carotids 2+ bilaterally; no bruits. No lymphadenopathy or thryomegaly appreciated. Cor: PMI normal. Regular rate & rhythm. No rubs, gallops. 1/6 SEM RUSB.  Lungs: Slight crackles at bases.  Abdomen: soft, nontender, nondistended. No hepatosplenomegaly. No bruits or masses. Good bowel sounds. Extremities: no cyanosis, clubbing, rash, no edema. Neuro: alert & orientedx3, cranial nerves grossly intact. Moves all 4 extremities w/o difficulty. Affect pleasant.  ASSESSMENT & PLAN: 1. CAD: Status post CABG for 3VD. No chest pain.  - Continue ASA, statin, and  bisoprolol.  2. Chronic diastolic CHF: Last EF improved to 55-60% (08/2013). Stable NYHA class II symptoms and volume status stable. Will continue lasix 80 mg BID. Check BMET and BNP today.  3. 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 I suspect that intrinsic lung disease (post-infectious pulmonary fibrosis) is a contributor to his dyspnea and hypoxemia. He is actually now off oxygen (just using CPAP at night).  4. Carotid stenosis: Followed at [facility]. 5. Hyperlipidemia: Continue statin, good lipids in 3/15.    6. AAA: s/p surgical repair. Will have repeat CT at [facility] in January.  7. Status post bioprosthetic AVR: Gradient across the aortic valve was elevated in 1/15.  However, this was around the time of his AAA surgery and may be reflective of high flow.  Repeat echo in 3/15 showed lower gradient, 21 mmHg. Followup with echo in 3/16. He will need antibiotic prophylaxis with dental work.  8. CKD: Check BMET today.    F/U 4 months  Loralie Champagne 02/25/2014

## 2014-03-04 ENCOUNTER — Encounter: Payer: Self-pay | Admitting: Cardiology

## 2014-03-06 ENCOUNTER — Encounter: Payer: Self-pay | Admitting: Cardiology

## 2014-03-14 ENCOUNTER — Other Ambulatory Visit: Payer: Self-pay | Admitting: Cardiovascular Disease

## 2014-03-20 NOTE — Progress Notes (Signed)
Per pt info added to his chart to reflect he is allergic to clindamycin, he had pna vaccine 03/02/14 at Dr Adela Ports office and had an eye exam with Dr Julio Alm in [place]

## 2014-03-25 ENCOUNTER — Encounter: Payer: Self-pay | Admitting: Cardiology

## 2014-04-22 ENCOUNTER — Other Ambulatory Visit (HOSPITAL_COMMUNITY): Payer: Self-pay | Admitting: Internal Medicine

## 2014-04-22 ENCOUNTER — Telehealth (HOSPITAL_COMMUNITY): Payer: Self-pay | Admitting: Adult Health

## 2014-04-22 ENCOUNTER — Telehealth: Payer: Self-pay | Admitting: Cardiology

## 2014-04-22 DIAGNOSIS — R001 Bradycardia, unspecified: Secondary | ICD-10-CM

## 2014-04-22 NOTE — Telephone Encounter (Signed)
Pt called because his HR is 44 beats/ minute his BP is 120/70. Pulse rate was taken on his wrist by a family member and was verified HR of 44 beats/minute and  regular. Pt denies any other symptoms. Pt takes Bisoprolol 2.5 mg in the evenings. Pt has not taken this medication today.

## 2014-04-22 NOTE — Telephone Encounter (Signed)
May hold bisoprolol for now, needs 48 hour holter.

## 2014-04-22 NOTE — Telephone Encounter (Signed)
Per Pt is aware and was instructed by Amy Clegg to peak up the 48 hrs monitor at this office on Octuber 28.th

## 2014-04-22 NOTE — Telephone Encounter (Signed)
Instructed to pick up 48 hour monitor on October 28 th at 10:00.   Mr Tosti verbalized understanding.   Jayleene Glaeser NP-C  4:18 PM

## 2014-04-22 NOTE — Telephone Encounter (Signed)
New message      Heart rate is 44.  Pt is not having any symptoms. Pt said he sees Dr [name] and Dr Haroldine Laws.  Please advise

## 2014-04-22 NOTE — Telephone Encounter (Signed)
   Received phone call from Mr  [name] regarding his heart rate being low.    He reports his heart has has been low for the last few days.  He says hs heart rate iis consistently  44 and usually runs in the 80s. Denies SOB.  BP stable 120/70.   Discussed with Dr [name].   I have instructed him to stop bisoprolol. Will place 48 hour holter monitor.   I have contacted [facility] for 48 hour holter monitor.    Joandy Burget NP-C  4:11 PM

## 2014-04-29 ENCOUNTER — Encounter: Payer: Self-pay | Admitting: *Deleted

## 2014-04-29 ENCOUNTER — Encounter (INDEPENDENT_AMBULATORY_CARE_PROVIDER_SITE_OTHER): Payer: Medicare Other

## 2014-04-29 DIAGNOSIS — R001 Bradycardia, unspecified: Secondary | ICD-10-CM

## 2014-04-29 NOTE — Progress Notes (Signed)
Patient ID: [name], male   DOB: [birth year], 73 y.o.   MRN: [id number] EVO 48 hour holter monitor applied to patient.

## 2014-05-07 ENCOUNTER — Telehealth: Payer: Self-pay | Admitting: Cardiology

## 2014-05-07 NOTE — Telephone Encounter (Signed)
New message      Pt want monitor results

## 2014-05-07 NOTE — Telephone Encounter (Signed)
Patient informed that his tracings were being scanned by [facility], but that he would receive results from the CHF clinic. He voiced good understanding.

## 2014-05-13 ENCOUNTER — Telehealth (HOSPITAL_COMMUNITY): Payer: Self-pay | Admitting: Vascular Surgery

## 2014-05-13 ENCOUNTER — Encounter: Payer: Self-pay | Admitting: Cardiology

## 2014-05-13 NOTE — Telephone Encounter (Signed)
Pt called wanting his 48hr holter results..please advise

## 2014-05-14 ENCOUNTER — Telehealth (HOSPITAL_COMMUNITY): Payer: Self-pay | Admitting: Vascular Surgery

## 2014-05-14 DIAGNOSIS — I441 Atrioventricular block, second degree: Secondary | ICD-10-CM

## 2014-05-14 NOTE — Telephone Encounter (Signed)
Pt called he would like his test results from his bradycardia test he took 3 weeks ago.[name] Please advise

## 2014-05-15 ENCOUNTER — Encounter: Payer: Self-pay | Admitting: Cardiology

## 2014-05-15 ENCOUNTER — Other Ambulatory Visit (HOSPITAL_COMMUNITY): Payer: Self-pay | Admitting: Internal Medicine

## 2014-05-21 ENCOUNTER — Other Ambulatory Visit (HOSPITAL_COMMUNITY): Payer: Self-pay | Admitting: Internal Medicine

## 2014-05-21 DIAGNOSIS — I5022 Chronic systolic (congestive) heart failure: Secondary | ICD-10-CM

## 2014-05-22 NOTE — Telephone Encounter (Signed)
Dr [name] has reviewed monitor results:  "2nd degree AV block, maybe type II, make sure pt off beta blocker and refer to EP."  Pt is aware and agreeable, he is sch to see Dr Caryl Comes 06/12/14 at 10:45

## 2014-06-11 ENCOUNTER — Encounter (HOSPITAL_COMMUNITY): Payer: Self-pay | Admitting: Cardiology

## 2014-06-12 ENCOUNTER — Encounter: Payer: Self-pay | Admitting: Internal Medicine

## 2014-06-12 ENCOUNTER — Ambulatory Visit (INDEPENDENT_AMBULATORY_CARE_PROVIDER_SITE_OTHER): Payer: Medicare Other | Admitting: Internal Medicine

## 2014-06-12 ENCOUNTER — Encounter: Payer: Self-pay | Admitting: *Deleted

## 2014-06-12 VITALS — BP 106/60 | HR 64 | Ht 65.0 in | Wt 220.2 lb

## 2014-06-12 DIAGNOSIS — I441 Atrioventricular block, second degree: Secondary | ICD-10-CM

## 2014-06-12 DIAGNOSIS — I452 Bifascicular block: Secondary | ICD-10-CM

## 2014-06-12 DIAGNOSIS — Z01812 Encounter for preprocedural laboratory examination: Secondary | ICD-10-CM

## 2014-06-12 DIAGNOSIS — I2510 Atherosclerotic heart disease of native coronary artery without angina pectoris: Secondary | ICD-10-CM

## 2014-06-12 DIAGNOSIS — Z951 Presence of aortocoronary bypass graft: Secondary | ICD-10-CM

## 2014-06-12 NOTE — Patient Instructions (Signed)
Your physician recommends that you continue on your current medications as directed. Please refer to the Current Medication list given to you today.  Your physician recommends that you return for pre-procedure lab work in: 07/07/14  Your physician has recommended that you have a pacemaker inserted. A pacemaker is a small device that is placed under the skin of your chest or abdomen to help control abnormal heart rhythms. This device uses electrical pulses to prompt the heart to beat at a normal rate. Pacemakers are used to treat heart rhythms that are too slow. Wire (leads) are attached to the pacemaker that goes into the chambers of you heart. This is done in the hospital and usually requires and overnight stay. Please see the instruction sheet given to you today for more information.  Your wound check is scheduled for 07/27/14 at 10:00 a.m. at [address]

## 2014-06-12 NOTE — Progress Notes (Signed)
ELECTROPHYSIOLOGY CONSULT NOTE  Patient ID: [name], MRN: [id number], DOB/AGE: [birth year] 71 y.o. Admit date: (Not on file) Date of Consult: 06/12/2014  Primary Physician: [name], MD Primary Cardiologist: DM  Chief Complaint:  Heart block   HPI [name] is a 73 y.o. male   He has a history of coronary artery disease with prior bypass surgery and bioprosthetic aortic valve replacement. Echocardiogram 1/15 demonstrated near-normal LV function which had represented an interval improvement from 35% the year before.   He has a  history of respiratory failure [id number] complicated by ARDS requiring tracheostomy in the context of COPD  And and influenza pneumonia. It was further complicated by empyema associated shock. He underwent AAA repair which was complicated by   acute kidney failure his renal function has normalized.  He has residual post infectious pulmonary fibrosis.  He noted while he was riding his exercise bicycle that his heart rate, normally in the 80s was now in the 40s. This was confirmed by his sister-in-law, a nurse, and then a Holter monitor was undertaken which demonstrated, during exercise, a decrease in the heart rate from 80-44.  He also had nocturnal Wenckebach  He has orthostatic lightheadedness but no lightheadedness otherwise.  Exercise tolerance is modestly impaired; he has some peripheral edema. He has had no significant chest pain..  Past Medical History  Diagnosis Date  . Hypertension   . Hyperlipidemia   . Prediabetes   . CHF (congestive heart failure) 07/2012; 10/17/2012  . COPD (chronic obstructive pulmonary disease)     a.  History of heavy smoking. PFTs (4/14) with mixed obstructive (COPD) and restrictive (post-ARDS) picture.   . Pneumonia 07/2012    PNA (H1N1 influenza + pneumococcus) in [id number] complicated by respiratory failure/ARDS. Required tracheostomy, now weaned off. Had left empyema requiring chest tube.   . Diabetes mellitus  without complication   . History of blood transfusion     "lots since January" (10/17/2012)  . Ischemic cardiomyopathy   . Anemia of chronic disease   . Aortic stenosis     Moderate to severe by echo in 4/14. Bioprosthetic #21 [facility] Ease aortic valve replacement in 4/14.   Marland Kitchen CAD (coronary artery disease)     a. 4/14 CABG: LIMA-LAD, seq SVG-ramus & OM1, SVG-PDA  . Carotid arterial disease     Carotid dopplers (0000000) with A999333 LICA stenosis.   Marland Kitchen AAA (abdominal aortic aneurysm)     5/14 CT showed > 6 cm AAA, also right iliac aneurysm. Not stent graft candidate.   . Esophageal reflux   . Ischemic cardiomyopathy     a. 4/14 LHC: pLAD 95, ost ramus 70-80, mLCx 90, EF 30%  . Chronic systolic heart failure     a. 1/14 ECHO: sev dil LV, EF30-35%, diff HK, mild MR, AS severe, AV grad 35 b. 8/14 ECHO: EF 55-60%, mild biopros AV sten mn grad. 25, RV mild dil, RA mild dil  . Complication of anesthesia     during last surgery had to be given special medicine b/c "something dropped" during surgery   . Anxiety     pt. admits that he has anxiety at times   . Shortness of breath     still goes deer hunting by himself  . OSA (obstructive sleep apnea)     on cpap- every sleep time.   . Chronic kidney disease     increased creatinine recently - being followed, near kidney failure fr. contrast dye   .  Prostate cancer      s/p radiation treatment. - (PT. DENIES)Has indwelling foley.   . Arthritis       Surgical History:  Past Surgical History  Procedure Laterality Date  . Transurethral microwave therapy  10/15/2012  . Nasal fracture surgery  1970's  . Tracheostomy  07/2012  . Tracheostomy closure  08/2012  . Anal fissure repair  2008  . Coronary artery bypass graft N/A 10/25/2012    Procedure: CORONARY ARTERY BYPASS GRAFTING (CABG);  Surgeon: Melrose Nakayama, MD;  Location: [place];  Service: Open Heart Surgery;  Laterality: N/A;  times 4 using left internal mammary artery and  endoscopically harvested bilateral saphenous vein   . Aortic valve replacement N/A 10/25/2012    Procedure: AORTIC VALVE REPLACEMENT (AVR);  Surgeon: Melrose Nakayama, MD;  Location: [place];  Service: Open Heart Surgery;  Laterality: N/A;  . Intraoperative transesophageal echocardiogram N/A 10/25/2012    Procedure: INTRAOPERATIVE TRANSESOPHAGEAL ECHOCARDIOGRAM;  Surgeon: Melrose Nakayama, MD;  Location: [place];  Service: Open Heart Surgery;  Laterality: N/A;  . Cardiac valve replacement    . Abdominal aortic aneurysm repair N/A 07/10/2013    Procedure: Resection and Graftiong of perirenal AAA; Insertion 14 x 8 Hemashield Graft Aorta to Left Common Iliac and to Right Common Femoral Artery With Ligastion of Right External and Interanl Iliac Artery;  Surgeon: Mal Misty, MD;  Location[facility];  Service: Vascular;  Laterality: N/A;  . Left and right heart catheterization with coronary angiogram N/A 10/22/2012    Procedure: LEFT AND RIGHT HEART CATHETERIZATION WITH CORONARY ANGIOGRAM;  Surgeon: Larey Dresser, MD;  Location[facility] CATH LAB;  Service: Cardiovascular;  Laterality: N/A;     Home Meds: Prior to Admission medications   Medication Sig Start Date End Date Taking? Authorizing Provider  albuterol (PROVENTIL) (2.5 MG/3ML) 0.083% nebulizer solution Take 3 mLs (2.5 mg total) by nebulization every 2 (two) hours as needed for wheezing or shortness of breath. 07/24/13   Samantha J Rhyne, PA-C  allopurinol (ZYLOPRIM) 100 MG tablet Take 100 mg by mouth daily. At nighttime to equal 400 daily    Historical Provider, MD  allopurinol (ZYLOPRIM) 300 MG tablet Take 300 mg by mouth daily.    Historical Provider, MD  aspirin EC 325 MG EC tablet Take 1 tablet (325 mg total) by mouth daily. 11/04/12   Donielle Liston Alba, PA-C  atorvastatin (LIPITOR) 80 MG tablet TAKE 1 TABLET (80 MG TOTAL) BY MOUTH DAILY BEFORE BREAKFAST. 03/16/14   Blane Ohara, MD  bisoprolol (ZEBETA) 5 MG tablet TAKE 1/2 TABLET BY MOUTH  AT BEDTIME 05/15/14   Larey Dresser, MD  bisoprolol (ZEBETA) 5 MG tablet TAKE 1/2 TABLET BY MOUTH AT BEDTIME 05/22/14   Jolaine Artist, MD  budesonide (PULMICORT) 0.25 MG/2ML nebulizer solution Take 2 mLs (0.25 mg total) by nebulization 2 (two) times daily. Dx 496 01/21/13   Tammy S Parrett, NP  calcitRIOL (ROCALTROL) 0.25 MCG capsule Take 0.25 mcg by mouth every other day.    Historical Provider, MD  furosemide (LASIX) 80 MG tablet TAKE 1 TABLET BY MOUTH TWICE A DAY 04/23/14   Jolaine Artist, MD  nitroGLYCERIN (NITROSTAT) 0.4 MG SL tablet Place 1 tablet (0.4 mg total) under the tongue every 5 (five) minutes as needed for chest pain. 12/26/12   Roger A Arguello, PA-C  pantoprazole (PROTONIX) 40 MG tablet Take 40 mg by mouth at bedtime.  12/17/12   Historical Provider, MD  potassium  chloride SA (K-DUR,KLOR-CON) 20 MEQ tablet Take 20 mEq by mouth 2 (two) times daily.    Historical Provider, MD  tamsulosin (FLOMAX) 0.4 MG CAPS capsule Take 0.4 mg by mouth at bedtime.  03/04/13   Historical Provider, MD    Allergies:  Allergies  Allergen Reactions  . Omnipaque [Iohexol] Other (See Comments)    Decreased kidney function  . Primaxin [Imipenem] Hives  . Cephalosporins Rash    Blisters  . Clindamycin/Lincomycin     History   Social History  . Marital Status: Married    Spouse Name: N/A    Number of Children: N/A  . Years of Education: N/A   Occupational History  . Not on file.   Social History Main Topics  . Smoking status: Former Smoker -- 2.00 packs/day for 58 years    Types: Cigarettes    Quit date: 07/20/2012  . Smokeless tobacco: Never Used  . Alcohol Use: No     Comment: 10/17/2012 "years since I've had a drink; never had problem with it"  . Drug Use: No  . Sexual Activity: Not Currently   Other Topics Concern  . Not on file   Social History Narrative   Patient lives with his wife.       Family History  Problem Relation Age of Onset  . Congestive Heart Failure      . Heart disease       ROS:  Please see the history of present illness.     All other systems reviewed and negative.    Physical Exam:  Blood pressure 106/60, pulse 64, height 5\' 5"  (1.651 m), weight 220 lb 3.2 oz (99.882 kg). General: Well developed, cherubic  male in no acute distress. Head: Normocephalic, atraumatic, sclera non-icteric, no xanthomas, nares are without discharge. EENT: normal Lymph Nodes:  none Back: without scoliosis/kyphosis , no CVA tendersness Neck: Negative for carotid bruits. JVD not elevated. Lungs: Clear bilaterally to auscultation without wheezes, rales, or rhonchi. Breathing is unlabored. Heart: RRR with S1 S2.  2/6 systolic murmur , rubs, or gallops appreciated. Abdomen: Soft, non-tender, non-distended with normoactive bowel sounds. No hepatomegaly. No rebound/guarding. No obvious abdominal masses. Msk:  Strength and tone appear normal for age. Extremities: No clubbing or cyanosis. 2 edema.  Distal pedal pulses are 2+ and equal bilaterally. Skin: Warm and Dry Neuro: Alert and oriented X 3. CN III-XII intact Grossly normal sensory and motor function . Psych:  Responds to questions appropriately with a normal affect.      Labs: Cardiac Enzymes No results for input(s): CKTOTAL, CKMB, TROPONINI in the last 72 hours. CBC Lab Results  Component Value Date   WBC 7.1 09/03/2013   HGB 11.6* 09/03/2013   HCT 36.8* 09/03/2013   MCV 92.0 09/03/2013   PLT 191 09/03/2013   PROTIME: No results for input(s): LABPROT, INR in the last 72 hours. Chemistry No results for input(s): NA, K, CL, CO2, BUN, CREATININE, CALCIUM, PROT, BILITOT, ALKPHOS, ALT, AST, GLUCOSE in the last 168 hours.  Invalid input(s): LABALBU Lipids Lab Results  Component Value Date   CHOL 151 09/03/2013   HDL 26* 09/03/2013   LDLCALC 75 09/03/2013   TRIG 249* 09/03/2013   BNP PRO B NATRIURETIC PEPTIDE (BNP)  Date/Time Value Ref Range Status  02/24/2014 03:58 PM 387.1* 0 - 125 pg/mL  Final  09/03/2013 10:40 AM 1037.0* 0 - 125 pg/mL Final  08/11/2013 04:26 PM 166.0* 0.0 - 100.0 pg/mL Final  06/12/2013 12:03 PM 50.0 0.0 - 100.0  pg/mL Final   Miscellaneous No results found for: DDIMER  Radiology/Studies:  No results found.  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  He has had exercise associated heart block. This to the infranodal location of the heart block which would be anticipated to improve rather than worsened during exercise if it were AV nodal. This is a class I indication for pacing as he has chronic bifascicular block and second-degree AV block that has persisted despite the removal of his beta blockers.  The benefits and risks were reviewed including but not limited to death,  perforation, infection, lead dislodgement and device malfunction.  The patient understands agrees and is willing to proceed.    Virl Axe

## 2014-06-23 ENCOUNTER — Telehealth: Payer: Self-pay | Admitting: *Deleted

## 2014-06-23 NOTE — Telephone Encounter (Signed)
Informed patient procedure time moved to 1 pm on 07/14/14. Be at hospital at 11 am. Patient verbalized understanding and agreeable to plan.

## 2014-06-29 ENCOUNTER — Encounter (HOSPITAL_COMMUNITY): Payer: Medicare Other

## 2014-07-07 ENCOUNTER — Ambulatory Visit (HOSPITAL_COMMUNITY)
Admission: RE | Admit: 2014-07-07 | Discharge: 2014-07-07 | Disposition: A | Discharge status: Home / Self Care | Payer: Medicare Other | Source: Ambulatory Visit | Attending: Cardiology | Admitting: Cardiology

## 2014-07-07 ENCOUNTER — Encounter (HOSPITAL_COMMUNITY): Payer: Medicare Other

## 2014-07-07 ENCOUNTER — Other Ambulatory Visit (INDEPENDENT_AMBULATORY_CARE_PROVIDER_SITE_OTHER): Payer: Medicare Other | Admitting: *Deleted

## 2014-07-07 VITALS — BP 102/54 | HR 54 | Wt 220.8 lb

## 2014-07-07 DIAGNOSIS — E119 Type 2 diabetes mellitus without complications: Secondary | ICD-10-CM | POA: Insufficient documentation

## 2014-07-07 DIAGNOSIS — I441 Atrioventricular block, second degree: Secondary | ICD-10-CM | POA: Insufficient documentation

## 2014-07-07 DIAGNOSIS — J449 Chronic obstructive pulmonary disease, unspecified: Secondary | ICD-10-CM | POA: Insufficient documentation

## 2014-07-07 DIAGNOSIS — Z954 Presence of other heart-valve replacement: Secondary | ICD-10-CM

## 2014-07-07 DIAGNOSIS — Z960 Presence of urogenital implants: Secondary | ICD-10-CM | POA: Diagnosis not present

## 2014-07-07 DIAGNOSIS — Z923 Personal history of irradiation: Secondary | ICD-10-CM | POA: Insufficient documentation

## 2014-07-07 DIAGNOSIS — Z87891 Personal history of nicotine dependence: Secondary | ICD-10-CM | POA: Insufficient documentation

## 2014-07-07 DIAGNOSIS — Z79899 Other long term (current) drug therapy: Secondary | ICD-10-CM | POA: Diagnosis not present

## 2014-07-07 DIAGNOSIS — Z951 Presence of aortocoronary bypass graft: Secondary | ICD-10-CM | POA: Insufficient documentation

## 2014-07-07 DIAGNOSIS — I452 Bifascicular block: Secondary | ICD-10-CM

## 2014-07-07 DIAGNOSIS — I350 Nonrheumatic aortic (valve) stenosis: Secondary | ICD-10-CM | POA: Diagnosis not present

## 2014-07-07 DIAGNOSIS — E785 Hyperlipidemia, unspecified: Secondary | ICD-10-CM | POA: Diagnosis not present

## 2014-07-07 DIAGNOSIS — I5032 Chronic diastolic (congestive) heart failure: Secondary | ICD-10-CM

## 2014-07-07 DIAGNOSIS — G4733 Obstructive sleep apnea (adult) (pediatric): Secondary | ICD-10-CM | POA: Insufficient documentation

## 2014-07-07 DIAGNOSIS — Z7982 Long term (current) use of aspirin: Secondary | ICD-10-CM | POA: Insufficient documentation

## 2014-07-07 DIAGNOSIS — D638 Anemia in other chronic diseases classified elsewhere: Secondary | ICD-10-CM | POA: Insufficient documentation

## 2014-07-07 DIAGNOSIS — N183 Chronic kidney disease, stage 3 unspecified: Secondary | ICD-10-CM

## 2014-07-07 DIAGNOSIS — I2510 Atherosclerotic heart disease of native coronary artery without angina pectoris: Secondary | ICD-10-CM | POA: Diagnosis not present

## 2014-07-07 DIAGNOSIS — C61 Malignant neoplasm of prostate: Secondary | ICD-10-CM | POA: Insufficient documentation

## 2014-07-07 DIAGNOSIS — M109 Gout, unspecified: Secondary | ICD-10-CM | POA: Diagnosis not present

## 2014-07-07 DIAGNOSIS — I129 Hypertensive chronic kidney disease with stage 1 through stage 4 chronic kidney disease, or unspecified chronic kidney disease: Secondary | ICD-10-CM | POA: Diagnosis not present

## 2014-07-07 DIAGNOSIS — I5022 Chronic systolic (congestive) heart failure: Secondary | ICD-10-CM

## 2014-07-07 DIAGNOSIS — I255 Ischemic cardiomyopathy: Secondary | ICD-10-CM | POA: Diagnosis not present

## 2014-07-07 DIAGNOSIS — Z952 Presence of prosthetic heart valve: Secondary | ICD-10-CM | POA: Insufficient documentation

## 2014-07-07 DIAGNOSIS — I6522 Occlusion and stenosis of left carotid artery: Secondary | ICD-10-CM | POA: Insufficient documentation

## 2014-07-07 DIAGNOSIS — I714 Abdominal aortic aneurysm, without rupture: Secondary | ICD-10-CM | POA: Insufficient documentation

## 2014-07-07 DIAGNOSIS — Z01812 Encounter for preprocedural laboratory examination: Secondary | ICD-10-CM

## 2014-07-07 DIAGNOSIS — N189 Chronic kidney disease, unspecified: Secondary | ICD-10-CM | POA: Insufficient documentation

## 2014-07-07 DIAGNOSIS — Z953 Presence of xenogenic heart valve: Secondary | ICD-10-CM

## 2014-07-07 LAB — BASIC METABOLIC PANEL
BUN: 35 mg/dL — AB (ref 6–23)
CO2: 28 meq/L (ref 19–32)
Calcium: 9.2 mg/dL (ref 8.4–10.5)
Chloride: 104 mEq/L (ref 96–112)
Creatinine, Ser: 1.6 mg/dL — ABNORMAL HIGH (ref 0.4–1.5)
GFR: 46.35 mL/min — ABNORMAL LOW (ref >60.00)
Glucose, Bld: 102 mg/dL — ABNORMAL HIGH (ref 70–99)
POTASSIUM: 4.3 meq/L (ref 3.5–5.1)
SODIUM: 139 meq/L (ref 135–145)

## 2014-07-07 LAB — CBC WITH DIFFERENTIAL/PLATELET
BASOS PCT: 0.3 % (ref 0.0–3.0)
Basophils Absolute: 0 10*3/uL (ref 0.0–0.1)
Eosinophils Absolute: 0.1 10*3/uL (ref 0.0–0.7)
Eosinophils Relative: 2 % (ref 0.0–5.0)
HEMATOCRIT: 42.9 % (ref 39.0–52.0)
Hemoglobin: 13.7 g/dL (ref 13.0–17.0)
Lymphocytes Relative: 7.9 % — ABNORMAL LOW (ref 12.0–46.0)
Lymphs Abs: 0.6 10*3/uL — ABNORMAL LOW (ref 0.7–4.0)
MCHC: 31.9 g/dL (ref 30.0–36.0)
MCV: 90.8 fl (ref 78.0–100.0)
MONOS PCT: 7.8 % (ref 3.0–12.0)
Monocytes Absolute: 0.6 10*3/uL (ref 0.1–1.0)
NEUTROS ABS: 5.9 10*3/uL (ref 1.4–7.7)
Neutrophils Relative %: 82 % — ABNORMAL HIGH (ref 43.0–77.0)
Platelets: 153 10*3/uL (ref 150.0–400.0)
RBC: 4.73 Mil/uL (ref 4.22–5.81)
RDW: 18.3 % — ABNORMAL HIGH (ref 11.5–15.5)
WBC: 7.3 10*3/uL (ref 4.0–10.5)

## 2014-07-07 NOTE — Patient Instructions (Signed)
Your physician recommends that you schedule a follow-up appointment in: March with an echocardiogram

## 2014-07-08 NOTE — Addendum Note (Signed)
Encounter addended by: Asencion Gowda, CCT on: 07/08/2014 12:08 PM<BR>     Documentation filed: Charges VN

## 2014-07-08 NOTE — Progress Notes (Signed)
Patient ID: [name], male   DOB: Dec 08, 1940, 74 y.o.   MRN: [id number] PCP: Dr. [name] Nephrologist: Dr Deterding Vascular: Dr Kellie Simmering  74 yo with history of ARDS/respiratory failure and tracheostomy in 07/2012 as well as DM, HTN, and COPD presented to the ER at [facility] with CHF in 10/2012. Patient had a prolonged hospitalization in 07/2012 with H1N1 influenza and pneumococcal PNA. This progressed to ARDS. He was intubated and ended up with tracheostomy. He has had the trach removed. He also had a left empyema requiring chest tube, septic shock with elevated troponin, and AKI which resolved.  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   He was admitted again in 10/2012 from his nursing home with exertional dyspnea and orthopnea. He had gained 21 lbs. Echo at admission showed EF 35% with global hypokinesis that looked worse in the anteroseptal wall. He also was noted to have aortic stenosis rated as moderate to severe. He was diuresed and cathed, showing severe 3 vessel disease. He then had CABG-AVR (bioprosthetic valve).   He had a large AAA and underwent surgical repair in 07/2013.  He had AKI and Pseudomonas PNA post-operatively.  He was discharged to a nursing home. Echo in 1/15 showed EF 55-60% with mildly dilated and dysfunctional RV and a bioprosthetic aortic valve with mean gradient 32 mmHg (higher than expected).   Repeat limited echo in 08/2013 showed shower aortic valve mean gradient of 21 mmHg.   In 11/15, patient had a holter monitor placed for bradycardia.  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  Baseline bifascicular block.  His HR also was noted to drop into the 40s with exercise, suggesting exercise-associated heart block. He saw Dr Caryl Comes and is set up for a pacemaker next week. He is off nodal blockers.   Generally feeling ok.  No lightheadedness or syncope.  Uses stationary bike for 45 minutes most days without dyspnea.  Uses CPAP.  Walks in  grocery store and [employer].  No chest pain.  Limited by hip pain.   Labs (5/14): K 3.6, creatinine 0.86, BNP 11508  Labs (6/14): K 4, creatinine 1.1 Labs (7/14): Creatinine 1.57 Potassium 4.3 Labs (8/14): TSH normal, BNP 1372 Labs (9/14): K 3.5, creatinine 1.5 Labs (12/14): K 3.5, creatinine 1.21 Labs (1/15): K 3.5, creatinine 3.35 Labs (2/15): K 4, creatinine 1.6, BNP 166 Labs (3/15): Hgb 11.6, pro-BNP 1037, creatinine 1.76, BUN 30, LDL 75, HDL 26 Labs (8/15): K 3.9, creatinine 1.55, HCT 40.6, LDL 71, HDL 27  ECG: NSR with RBBB and LAFB, Mobitz type II block with preserved heart rate  ROS: All systems negative except as listed in HPI, PMH and Problem List.  PMH: 1. PNA (H1N1 influenza + pneumococcus) in [id number] complicated by respiratory failure/ARDS. Required tracheostomy, now weaned off. Had left empyema requiring chest tube.  Has developed post-infectious pulmonary fibrosis.  2. Prostate CA s/p radiation treatment. Has indwelling foley.  3. OSA: using CPAP 4. HTN  5. Hyperlipidemia  6. COPD: History of heavy smoking. PFTs (4/14) with mixed obstructive (COPD) and restrictive (post-ARDS) picture.  7. Type II diabetes  8. Anemia of chronic disease.  9. Ischemic cardiomyopathy: Echo (1/14) with severely dilated LV, EF 30-35%, diffuse hypokinesis worse in the anteroseptal wall, grade II diastolic dysfunction, mild MR, AS interpreted as "moderate to severe" with aortic valve mean gradient 23 mmHg. Echo (4/14) witih EF 35-40%, mid to apical anteroseptal akinesis, moderate to severe AS with mean gradient 33 mmHg, AVA 0.91  cm^2. Echo (5/14) post CABG showed EF 50%, anteroseptal hypokinesis, bioprosthetic aortic valve well-seated. Echo (1/15) with EF 55-60%, mild LVH, mildly dilated RV with mildly decreased systolic function, D-shaped interventricular septum, bioprosthetic aortic valve with mean gardient 32 mmHg.  Echo (3/15) with EF 55-60%, mild LVH, bioprosthetic aortic valve with mean gradient 21  mmHg (lower), no AI, RV moderately dilated with mild to moderately decreased systolic function.  10. Aortic stenosis: Moderate to severe by echo in 4/14. Bioprosthetic #21 [facility] Ease aortic valve replacement in 4/14.  11. CAD: LHC (4/14) with 95% pLAD, 70-80% ostial ramus, 70% mLCx, 90% pRCA, EF 35%. CABG 4/14 with LIMA-LAD, seq SVG-ramus and OM1, SVG-PDA.  12. Carotid stenosis: Carotid dopplers (0000000) with A999333 LICA stenosis.  Carotids (123456) with 123456 LICA stenosis.  13. AAA: 5/14 CT showed > 6 cm AAA, also right iliac aneurysm. Not stent graft candidate.  7/14 CT showed 7 cm AAA.  Surgical repair 1/15.  14. CKD: AKI in 7/14 from contrast nephropathy.  AKI in 1/15 after AAA repair.  15. Contrast allergy.  16. ABIs 9/14 were normal.  17. Gout 18. 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  HR drops with exercise (likely exercise-induced heart block).    SH: Quit smoking in 1/14, married, no ETOH, lives in [place].   FH: No premature CAD  ROS: All systems reviewed and negative except as per HPI.    Current Outpatient Prescriptions  Medication Sig Dispense Refill  . albuterol (PROVENTIL) (2.5 MG/3ML) 0.083% nebulizer solution Take 3 mLs (2.5 mg total) by nebulization every 2 (two) hours as needed for wheezing or shortness of breath.    . allopurinol (ZYLOPRIM) 100 MG tablet Take 100 mg by mouth daily. At nighttime to equal 400 daily    . allopurinol (ZYLOPRIM) 300 MG tablet Take 300 mg by mouth daily.    Marland Kitchen aspirin EC 325 MG EC tablet Take 1 tablet (325 mg total) by mouth daily. (Patient taking differently: Take 81 mg by mouth daily. ) 30 tablet   . atorvastatin (LIPITOR) 80 MG tablet TAKE 1 TABLET (80 MG TOTAL) BY MOUTH DAILY BEFORE BREAKFAST. 30 tablet 6  . budesonide (PULMICORT) 0.25 MG/2ML nebulizer solution Take 2 mLs (0.25 mg total) by nebulization 2 (two) times daily. Dx 496 120 mL 12  . calcitRIOL (ROCALTROL) 0.25 MCG capsule Take 0.25 mcg by  mouth every other day.    . furosemide (LASIX) 80 MG tablet TAKE 1 TABLET BY MOUTH TWICE A DAY 60 tablet 11  . nitroGLYCERIN (NITROSTAT) 0.4 MG SL tablet Place 1 tablet (0.4 mg total) under the tongue every 5 (five) minutes as needed for chest pain. 25 tablet 12  . pantoprazole (PROTONIX) 40 MG tablet Take 40 mg by mouth at bedtime.     . potassium chloride SA (K-DUR,KLOR-CON) 20 MEQ tablet Take 20 mEq by mouth 2 (two) times daily.    . bisoprolol (ZEBETA) 5 MG tablet TAKE 1/2 TABLET BY MOUTH AT BEDTIME (Patient not taking: Reported on 07/07/2014) 15 tablet 3  . bisoprolol (ZEBETA) 5 MG tablet TAKE 1/2 TABLET BY MOUTH AT BEDTIME (Patient not taking: Reported on 07/07/2014) 15 tablet 3  . tamsulosin (FLOMAX) 0.4 MG CAPS capsule Take 0.4 mg by mouth at bedtime.      No current facility-administered medications for this encounter.    Filed Vitals:   07/07/14 1208  BP: 102/54  Pulse: 54  Weight: 220 lb 12.8 oz (100.154 kg)  SpO2:  95%   PHYSICAL EXAM: General:  NAD, overweight, wife present HEENT: normal Neck: Thick, JVP 7. Carotids 2+ bilaterally; no bruits. No lymphadenopathy or thryomegaly appreciated. Cor: PMI normal. Regular rate & rhythm. No rubs, gallops. 1/6 SEM RUSB.  Lungs: Slight crackles at bases.  Abdomen: soft, nontender, nondistended. No hepatosplenomegaly. No bruits or masses. Good bowel sounds. Extremities: no cyanosis, clubbing, rash, no edema. Neuro: alert & orientedx3, cranial nerves grossly intact. Moves all 4 extremities w/o difficulty. Affect pleasant.  ASSESSMENT & PLAN: 1. CAD: Status post CABG for 3VD. No chest pain.  - Continue ASA, statin, and bisoprolol.  2. Chronic diastolic CHF: Last EF improved to 55-60% (08/2013). Stable NYHA class II symptoms and volume status stable. Will continue lasix 80 mg BID. Check BMET and BNP today.  3. 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 I suspect that intrinsic lung disease (post-infectious pulmonary  fibrosis) is a contributor to his dyspnea and hypoxemia. He is actually now off oxygen (just using CPAP at night).  4. Carotid stenosis: Followed at [facility]. 5. Hyperlipidemia: Continue statin, good lipids in 8/15.    6. AAA: s/p surgical repair. Will have repeat CT at [facility] in January.  7. Status post bioprosthetic AVR: Gradient across the aortic valve was elevated in 1/15.  However, this was around the time of his AAA surgery and may be reflective of high flow.  Repeat echo in 3/15 showed lower gradient, 21 mmHg. Followup with echo in 3/16. He will need antibiotic prophylaxis with dental work.  8. CKD: Check BMET today.   9. 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  Plan for PCM next week with Dr. Caryl Comes.  He denies lightheadedness or syncope.   Loralie Champagne 07/08/2014

## 2014-07-09 NOTE — Telephone Encounter (Signed)
Called patient to reschedule PPM -- moved date to 1/13. Patient is to be at hospital at 1:30 pm for 3:30 procedure. Patient aware ok to have light breakfast by 7am. Patient verbalized understanding and agreeable to plan.

## 2014-07-15 ENCOUNTER — Encounter (HOSPITAL_COMMUNITY): Payer: Self-pay | Admitting: *Deleted

## 2014-07-15 ENCOUNTER — Encounter (HOSPITAL_COMMUNITY)
Admission: RE | Disposition: A | Discharge status: Home / Self Care | Payer: Self-pay | Source: Ambulatory Visit | Attending: Internal Medicine

## 2014-07-15 ENCOUNTER — Ambulatory Visit (HOSPITAL_COMMUNITY)
Admission: RE | Admit: 2014-07-15 | Discharge: 2014-07-17 | Disposition: A | Discharge status: Home / Self Care | Payer: Medicare Other | Source: Ambulatory Visit | Attending: Internal Medicine | Admitting: Internal Medicine

## 2014-07-15 DIAGNOSIS — E785 Hyperlipidemia, unspecified: Secondary | ICD-10-CM | POA: Diagnosis not present

## 2014-07-15 DIAGNOSIS — Z951 Presence of aortocoronary bypass graft: Secondary | ICD-10-CM | POA: Insufficient documentation

## 2014-07-15 DIAGNOSIS — J449 Chronic obstructive pulmonary disease, unspecified: Secondary | ICD-10-CM | POA: Insufficient documentation

## 2014-07-15 DIAGNOSIS — I255 Ischemic cardiomyopathy: Secondary | ICD-10-CM | POA: Insufficient documentation

## 2014-07-15 DIAGNOSIS — I129 Hypertensive chronic kidney disease with stage 1 through stage 4 chronic kidney disease, or unspecified chronic kidney disease: Secondary | ICD-10-CM | POA: Diagnosis not present

## 2014-07-15 DIAGNOSIS — J8410 Pulmonary fibrosis, unspecified: Secondary | ICD-10-CM | POA: Insufficient documentation

## 2014-07-15 DIAGNOSIS — K219 Gastro-esophageal reflux disease without esophagitis: Secondary | ICD-10-CM | POA: Diagnosis not present

## 2014-07-15 DIAGNOSIS — E119 Type 2 diabetes mellitus without complications: Secondary | ICD-10-CM | POA: Insufficient documentation

## 2014-07-15 DIAGNOSIS — Z959 Presence of cardiac and vascular implant and graft, unspecified: Secondary | ICD-10-CM

## 2014-07-15 DIAGNOSIS — I441 Atrioventricular block, second degree: Principal | ICD-10-CM | POA: Insufficient documentation

## 2014-07-15 DIAGNOSIS — Z953 Presence of xenogenic heart valve: Secondary | ICD-10-CM | POA: Insufficient documentation

## 2014-07-15 DIAGNOSIS — Z7951 Long term (current) use of inhaled steroids: Secondary | ICD-10-CM | POA: Insufficient documentation

## 2014-07-15 DIAGNOSIS — F419 Anxiety disorder, unspecified: Secondary | ICD-10-CM | POA: Diagnosis not present

## 2014-07-15 DIAGNOSIS — G4733 Obstructive sleep apnea (adult) (pediatric): Secondary | ICD-10-CM | POA: Insufficient documentation

## 2014-07-15 DIAGNOSIS — Z79899 Other long term (current) drug therapy: Secondary | ICD-10-CM | POA: Insufficient documentation

## 2014-07-15 DIAGNOSIS — N189 Chronic kidney disease, unspecified: Secondary | ICD-10-CM | POA: Insufficient documentation

## 2014-07-15 DIAGNOSIS — I4510 Unspecified right bundle-branch block: Secondary | ICD-10-CM | POA: Diagnosis not present

## 2014-07-15 DIAGNOSIS — Z7982 Long term (current) use of aspirin: Secondary | ICD-10-CM | POA: Insufficient documentation

## 2014-07-15 DIAGNOSIS — I2510 Atherosclerotic heart disease of native coronary artery without angina pectoris: Secondary | ICD-10-CM | POA: Diagnosis not present

## 2014-07-15 DIAGNOSIS — I5022 Chronic systolic (congestive) heart failure: Secondary | ICD-10-CM | POA: Insufficient documentation

## 2014-07-15 DIAGNOSIS — I442 Atrioventricular block, complete: Secondary | ICD-10-CM | POA: Diagnosis present

## 2014-07-15 HISTORY — PX: PERMANENT PACEMAKER INSERTION: SHX5480

## 2014-07-15 LAB — GLUCOSE, CAPILLARY
GLUCOSE-CAPILLARY: 103 mg/dL — AB (ref 70–99)
GLUCOSE-CAPILLARY: 128 mg/dL — AB (ref 70–99)

## 2014-07-15 LAB — SURGICAL PCR SCREEN
MRSA, PCR: POSITIVE — AB
STAPHYLOCOCCUS AUREUS: POSITIVE — AB

## 2014-07-15 SURGERY — PERMANENT PACEMAKER INSERTION
Anesthesia: LOCAL

## 2014-07-15 MED ORDER — PANTOPRAZOLE SODIUM 40 MG PO TBEC
40.0000 mg | DELAYED_RELEASE_TABLET | Freq: Every day | ORAL | Status: DC
  Administered 2014-07-15 – 2014-07-16 (×2): 40 mg via ORAL
  Filled 2014-07-15 (×2): qty 1

## 2014-07-15 MED ORDER — BUDESONIDE 0.25 MG/2ML IN SUSP
0.2500 mg | Freq: Two times a day (BID) | RESPIRATORY_TRACT | Status: DC
  Administered 2014-07-15 – 2014-07-17 (×4): 0.25 mg via RESPIRATORY_TRACT
  Filled 2014-07-15 (×5): qty 2

## 2014-07-15 MED ORDER — ALLOPURINOL 100 MG PO TABS: 100.0000 mg | ORAL_TABLET | Freq: Every day | ORAL | Status: DC

## 2014-07-15 MED ORDER — ACETAMINOPHEN 325 MG PO TABS: 325.0000 mg | ORAL_TABLET | ORAL | Status: DC | PRN

## 2014-07-15 MED ORDER — CHLORHEXIDINE GLUCONATE 4 % EX LIQD
60.0000 mL | Freq: Once | CUTANEOUS | Status: DC
  Filled 2014-07-15: qty 60

## 2014-07-15 MED ORDER — DIPHENHYDRAMINE HCL 50 MG/ML IJ SOLN
INTRAMUSCULAR | Status: AC
  Filled 2014-07-15: qty 1

## 2014-07-15 MED ORDER — SODIUM CHLORIDE 0.9 % IV SOLN
INTRAVENOUS | Status: DC
  Administered 2014-07-15: 15:00:00 via INTRAVENOUS

## 2014-07-15 MED ORDER — NITROGLYCERIN 0.4 MG SL SUBL: 0.4000 mg | SUBLINGUAL_TABLET | SUBLINGUAL | Status: DC | PRN

## 2014-07-15 MED ORDER — MIDAZOLAM HCL 5 MG/5ML IJ SOLN
INTRAMUSCULAR | Status: AC
  Filled 2014-07-15: qty 5

## 2014-07-15 MED ORDER — BISOPROLOL FUMARATE 5 MG PO TABS
2.5000 mg | ORAL_TABLET | Freq: Every day | ORAL | Status: DC
  Administered 2014-07-15 – 2014-07-16 (×2): 2.5 mg via ORAL
  Filled 2014-07-15 (×3): qty 0.5

## 2014-07-15 MED ORDER — ALLOPURINOL 300 MG PO TABS
300.0000 mg | ORAL_TABLET | Freq: Every day | ORAL | Status: DC
  Administered 2014-07-16 – 2014-07-17 (×2): 300 mg via ORAL
  Filled 2014-07-15 (×3): qty 1

## 2014-07-15 MED ORDER — DIPHENHYDRAMINE HCL 50 MG/ML IJ SOLN
25.0000 mg | Freq: Once | INTRAMUSCULAR | Status: AC
Start: 2014-07-16 — End: 2014-07-16
  Administered 2014-07-16: 25 mg via INTRAVENOUS
  Filled 2014-07-15: qty 1

## 2014-07-15 MED ORDER — FUROSEMIDE 80 MG PO TABS
80.0000 mg | ORAL_TABLET | Freq: Two times a day (BID) | ORAL | Status: DC
  Administered 2014-07-15 – 2014-07-17 (×4): 80 mg via ORAL
  Filled 2014-07-15 (×7): qty 1

## 2014-07-15 MED ORDER — ASPIRIN EC 81 MG PO TBEC
81.0000 mg | DELAYED_RELEASE_TABLET | Freq: Every day | ORAL | Status: DC
  Administered 2014-07-16 – 2014-07-17 (×2): 81 mg via ORAL
  Filled 2014-07-15 (×2): qty 1

## 2014-07-15 MED ORDER — LIDOCAINE HCL (PF) 1 % IJ SOLN
INTRAMUSCULAR | Status: AC
  Filled 2014-07-15: qty 30

## 2014-07-15 MED ORDER — ONDANSETRON HCL 4 MG/2ML IJ SOLN: 4.0000 mg | Freq: Four times a day (QID) | INTRAMUSCULAR | Status: DC | PRN

## 2014-07-15 MED ORDER — TAMSULOSIN HCL 0.4 MG PO CAPS
0.4000 mg | ORAL_CAPSULE | Freq: Every day | ORAL | Status: DC
  Administered 2014-07-16: 0.4 mg via ORAL
  Filled 2014-07-15 (×3): qty 1

## 2014-07-15 MED ORDER — LABETALOL HCL 5 MG/ML IV SOLN
INTRAVENOUS | Status: AC
  Filled 2014-07-15: qty 4

## 2014-07-15 MED ORDER — SODIUM CHLORIDE 0.9 % IV SOLN
INTRAVENOUS | Status: AC
  Administered 2014-07-15: 50 mL/h via INTRAVENOUS

## 2014-07-15 MED ORDER — LIDOCAINE HCL (PF) 1 % IJ SOLN
INTRAMUSCULAR | Status: AC
  Filled 2014-07-15: qty 60

## 2014-07-15 MED ORDER — MUPIROCIN 2 % EX OINT
1.0000 | TOPICAL_OINTMENT | Freq: Two times a day (BID) | CUTANEOUS | Status: DC
Start: 2014-07-15 — End: 2014-07-17
  Administered 2014-07-15 – 2014-07-17 (×4): 1 via NASAL

## 2014-07-15 MED ORDER — MUPIROCIN 2 % EX OINT
TOPICAL_OINTMENT | Freq: Two times a day (BID) | CUTANEOUS | Status: DC
  Administered 2014-07-15: 1 via NASAL
  Filled 2014-07-15: qty 22

## 2014-07-15 MED ORDER — VANCOMYCIN HCL IN DEXTROSE 1-5 GM/200ML-% IV SOLN
1000.0000 mg | Freq: Two times a day (BID) | INTRAVENOUS | Status: AC
  Administered 2014-07-16: 1000 mg via INTRAVENOUS
  Filled 2014-07-15: qty 200

## 2014-07-15 MED ORDER — FENTANYL CITRATE 0.05 MG/ML IJ SOLN
INTRAMUSCULAR | Status: AC
  Filled 2014-07-15: qty 2

## 2014-07-15 MED ORDER — ATORVASTATIN CALCIUM 80 MG PO TABS
80.0000 mg | ORAL_TABLET | Freq: Every day | ORAL | Status: DC
  Administered 2014-07-16 – 2014-07-17 (×2): 80 mg via ORAL
  Filled 2014-07-15 (×2): qty 1

## 2014-07-15 MED ORDER — POTASSIUM CHLORIDE CRYS ER 20 MEQ PO TBCR
20.0000 meq | EXTENDED_RELEASE_TABLET | Freq: Two times a day (BID) | ORAL | Status: DC
  Administered 2014-07-15 – 2014-07-17 (×4): 20 meq via ORAL
  Filled 2014-07-15 (×5): qty 1

## 2014-07-15 MED ORDER — CHLORHEXIDINE GLUCONATE CLOTH 2 % EX PADS
6.0000 | MEDICATED_PAD | Freq: Every day | CUTANEOUS | Status: DC
  Administered 2014-07-16 – 2014-07-17 (×2): 6 via TOPICAL

## 2014-07-15 MED ORDER — MUPIROCIN 2 % EX OINT
TOPICAL_OINTMENT | CUTANEOUS | Status: AC
  Administered 2014-07-15: 1 via NASAL
  Filled 2014-07-15: qty 22

## 2014-07-15 MED ORDER — ALLOPURINOL 100 MG PO TABS
100.0000 mg | ORAL_TABLET | Freq: Every day | ORAL | Status: DC
  Administered 2014-07-15 – 2014-07-16 (×2): 100 mg via ORAL
  Filled 2014-07-15 (×3): qty 1

## 2014-07-15 MED ORDER — VANCOMYCIN HCL 10 G IV SOLR
1500.0000 mg | INTRAVENOUS | Status: AC
  Administered 2014-07-15: 1500 mg via INTRAVENOUS
  Filled 2014-07-15: qty 1500

## 2014-07-15 MED ORDER — SODIUM CHLORIDE 0.9 % IR SOLN
80.0000 mg | Status: AC
  Administered 2014-07-15: 80 mg
  Filled 2014-07-15: qty 2

## 2014-07-15 MED ORDER — ALBUTEROL SULFATE (2.5 MG/3ML) 0.083% IN NEBU: 2.5000 mg | INHALATION_SOLUTION | RESPIRATORY_TRACT | Status: DC | PRN

## 2014-07-15 MED ORDER — CALCITRIOL 0.25 MCG PO CAPS
0.2500 ug | ORAL_CAPSULE | ORAL | Status: DC
  Administered 2014-07-17: 0.25 ug via ORAL
  Filled 2014-07-15: qty 1

## 2014-07-15 NOTE — Progress Notes (Signed)
PT WITH some erythema over the right arm through which was given the vanc and labetolol   Initially with the former,  We gave benadryl and the symptoms are subsiding

## 2014-07-15 NOTE — Progress Notes (Signed)
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 MRSA order set placed for positive MRSA PCR. Wife and daughters at bedside and informed of contact precautions (to gown, glove and wash hands when exiting room).

## 2014-07-15 NOTE — Progress Notes (Signed)
Pt has developed device dependence, not surprisingly   Will admit to ICU instead

## 2014-07-15 NOTE — H&P (Signed)
Patient Care Team: [name], MD as PCP - General (Internal Medicine) Larey Dresser, MD as Attending Physician (Cardiology)   HPI  [name] is a 74 y.o. male ADMITTED  For pacer insertion for exercise assoc bradycardia consistent with infraHISian block.  He remains with exercise intolerance    He  has a history of coronary artery disease with prior bypass surgery and bioprosthetic aortic valve replacement. Echocardiogram 1/15 demonstrated near-normal LV function which had represented an interval improvement from 35% the year before.  He has a history of respiratory failure [id number] complicated by ARDS requiring tracheostomy in the context of COPD And   influenza pneumonia. It was further complicated by empyema associated shock. He underwent AAA repair which was complicated by acute kidney failure;  his renal function has normalized.  He has residual post infectious pulmonary fibrosis.  He noted while he was riding his exercise bicycle that his heart rate, normally in the 80s was now in the 40s. This was confirmed by his sister-in-law, a nurse, and then a Holter monitor was undertaken which demonstrated, during exercise, a decrease in the heart rate from 80-44.  He also had nocturnal Wenckebach  He has orthostatic lightheadedness but no lightheadedness otherwise.     Past Medical History  Diagnosis Date  . Hypertension   . Hyperlipidemia   . Prediabetes   . CHF (congestive heart failure) 07/2012; 10/17/2012  . COPD (chronic obstructive pulmonary disease)     a.  History of heavy smoking. PFTs (4/14) with mixed obstructive (COPD) and restrictive (post-ARDS) picture.   . Pneumonia 07/2012    PNA (H1N1 influenza + pneumococcus) in [id number] complicated by respiratory failure/ARDS. Required tracheostomy, now weaned off. Had left empyema requiring chest tube.   . Diabetes mellitus without complication   . History of blood transfusion     "lots since January" (10/17/2012)   . Ischemic cardiomyopathy   . Anemia of chronic disease   . Aortic stenosis     Moderate to severe by echo in 4/14. Bioprosthetic #21 [facility] Ease aortic valve replacement in 4/14.   Marland Kitchen CAD (coronary artery disease)     a. 4/14 CABG: LIMA-LAD, seq SVG-ramus & OM1, SVG-PDA  . Carotid arterial disease     Carotid dopplers (0000000) with A999333 LICA stenosis.   Marland Kitchen AAA (abdominal aortic aneurysm)     5/14 CT showed > 6 cm AAA, also right iliac aneurysm. Not stent graft candidate.   . Esophageal reflux   . Ischemic cardiomyopathy     a. 4/14 LHC: pLAD 95, ost ramus 70-80, mLCx 90, EF 30%  . Chronic systolic heart failure     a. 1/14 ECHO: sev dil LV, EF30-35%, diff HK, mild MR, AS severe, AV grad 35 b. 8/14 ECHO: EF 55-60%, mild biopros AV sten mn grad. 25, RV mild dil, RA mild dil  . Complication of anesthesia     during last surgery had to be given special medicine b/c "something dropped" during surgery   . Anxiety     pt. admits that he has anxiety at times   . Shortness of breath     still goes deer hunting by himself  . OSA (obstructive sleep apnea)     on cpap- every sleep time.   . Chronic kidney disease     increased creatinine recently - being followed, near kidney failure fr. contrast dye   . Prostate cancer      s/p radiation treatment. - (  PT. DENIES)Has indwelling foley.   . Arthritis     Past Surgical History  Procedure Laterality Date  . Transurethral microwave therapy  10/15/2012  . Nasal fracture surgery  1970's  . Tracheostomy  07/2012  . Tracheostomy closure  08/2012  . Anal fissure repair  2008  . Coronary artery bypass graft N/A 10/25/2012    Procedure: CORONARY ARTERY BYPASS GRAFTING (CABG);  Surgeon: Melrose Nakayama, MD;  Location: [place];  Service: Open Heart Surgery;  Laterality: N/A;  times 4 using left internal mammary artery and endoscopically harvested bilateral saphenous vein   . Aortic valve replacement N/A 10/25/2012    Procedure: AORTIC VALVE  REPLACEMENT (AVR);  Surgeon: Melrose Nakayama, MD;  Location: [place];  Service: Open Heart Surgery;  Laterality: N/A;  . Intraoperative transesophageal echocardiogram N/A 10/25/2012    Procedure: INTRAOPERATIVE TRANSESOPHAGEAL ECHOCARDIOGRAM;  Surgeon: Melrose Nakayama, MD;  Location: [place];  Service: Open Heart Surgery;  Laterality: N/A;  . Cardiac valve replacement    . Abdominal aortic aneurysm repair N/A 07/10/2013    Procedure: Resection and Graftiong of perirenal AAA; Insertion 14 x 8 Hemashield Graft Aorta to Left Common Iliac and to Right Common Femoral Artery With Ligastion of Right External and Interanl Iliac Artery;  Surgeon: Mal Misty, MD;  Location: [facility] OR;  Service: Vascular;  Laterality: N/A;  . Left and right heart catheterization with coronary angiogram N/A 10/22/2012    Procedure: LEFT AND RIGHT HEART CATHETERIZATION WITH CORONARY ANGIOGRAM;  Surgeon: Larey Dresser, MD;  Location: [facility] CATH LAB;  Service: Cardiovascular;  Laterality: N/A;    Current Facility-Administered Medications  Medication Dose Route Frequency Provider Last Rate Last Dose  . 0.9 %  sodium chloride infusion   Intravenous Continuous Deboraha Sprang, MD      . 0.9 %  sodium chloride infusion   Intravenous Continuous Deboraha Sprang, MD      . chlorhexidine (HIBICLENS) 4 % liquid 4 application  60 mL Topical Once Deboraha Sprang, MD      . gentamicin (GARAMYCIN) 80 mg in sodium chloride irrigation 0.9 % 500 mL irrigation  80 mg Irrigation On Call Deboraha Sprang, MD      . mupirocin ointment Drue Stager) 2 %   Nasal BID Deboraha Sprang, MD      . mupirocin ointment (BACTROBAN) 2 %           . vancomycin (VANCOCIN) 1,500 mg in sodium chloride 0.9 % 500 mL IVPB  1,500 mg Intravenous On Call Deboraha Sprang, MD         Medication List    ASK your doctor about these medications        albuterol (2.5 MG/3ML) 0.083% nebulizer solution  Commonly known as:  PROVENTIL  Take 3 mLs (2.5 mg total) by nebulization  every 2 (two) hours as needed for wheezing or shortness of breath.     allopurinol 300 MG tablet  Commonly known as:  ZYLOPRIM  Take 300 mg by mouth daily.     allopurinol 100 MG tablet  Commonly known as:  ZYLOPRIM  Take 100 mg by mouth daily. At nighttime to equal 400 daily     aspirin EC 81 MG tablet  Take 81 mg by mouth daily.     aspirin 325 MG EC tablet  Take 1 tablet (325 mg total) by mouth daily.     atorvastatin 80 MG tablet  Commonly known as:  LIPITOR  Take  80 mg by mouth daily.     atorvastatin 80 MG tablet  Commonly known as:  LIPITOR  TAKE 1 TABLET (80 MG TOTAL) BY MOUTH DAILY BEFORE BREAKFAST.     bisoprolol 5 MG tablet  Commonly known as:  ZEBETA  TAKE 1/2 TABLET BY MOUTH AT BEDTIME     bisoprolol 5 MG tablet  Commonly known as:  ZEBETA  TAKE 1/2 TABLET BY MOUTH AT BEDTIME     budesonide 0.25 MG/2ML nebulizer solution  Commonly known as:  PULMICORT  Take 2 mLs (0.25 mg total) by nebulization 2 (two) times daily. Dx 496     calcitRIOL 0.25 MCG capsule  Commonly known as:  ROCALTROL  Take 0.25 mcg by mouth every other day.     furosemide 80 MG tablet  Commonly known as:  LASIX  TAKE 1 TABLET BY MOUTH TWICE A DAY     nitroGLYCERIN 0.4 MG SL tablet  Commonly known as:  NITROSTAT  Place 1 tablet (0.4 mg total) under the tongue every 5 (five) minutes as needed for chest pain.     pantoprazole 40 MG tablet  Commonly known as:  PROTONIX  Take 40 mg by mouth at bedtime.     potassium chloride SA 20 MEQ tablet  Commonly known as:  K-DUR,KLOR-CON  Take 20 mEq by mouth 2 (two) times daily.     tamsulosin 0.4 MG Caps capsule  Commonly known as:  FLOMAX  Take 0.4 mg by mouth at bedtime.         Allergies  Allergen Reactions  . Omnipaque [Iohexol] Other (See Comments)    Decreased kidney function  . Primaxin [Imipenem] Hives  . Cephalosporins Rash    Blisters  . Clindamycin/Lincomycin     Review of Systems negative except from HPI and  PMH  Physical Exam BP 152/61 mmHg  Pulse 47  Temp(Src) 97.4 F (36.3 C) (Oral)  Resp 18  Ht 5\' 5"  (1.651 m)  Wt 219 lb (99.338 kg)  BMI 36.44 kg/m2  SpO2 98% Well developed and well nourished in no acute distress HENT normal E scleral and icterus clear Neck Supple JVP flat; carotids brisk and full Clear to ausculation  Regular rate and rhythm, 3/6  Murmur without gallops or rub Soft with active bowel sounds No clubbing cyanosis    Edema Alert and oriented, grossly normal motor and sensory function Skin Warm and Dry  ECG 1/5 2AVB2 with RBBB  Assessment and  Plan  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  The patient understands agrees and is willing to proceed.

## 2014-07-15 NOTE — Progress Notes (Signed)
eLink Physician-Brief Progress Note Patient Name: [name] DOB: February 17, 1941 MRN: [id number]   Date of Service  07/15/2014  HPI/Events of Note  S/p pacer, now dep on pacer, no distress, sats good, speaking well to family Labs reviewed  eICU Interventions  Any declines - stat pcxr required, Ill assume flourop Use to r/o pTX     Intervention Category Evaluation Type: New Patient Evaluation  Raylene Miyamoto. 07/15/2014, 6:08 PM

## 2014-07-15 NOTE — CV Procedure (Signed)
Preop DX:: 2 degree non reversible symptomatic heart block with RBBB  Post op DX:: same  Procedure   dual =pacemaker implantation  After routine prep and drape, lidocaine was infiltrated in the prepectoral subclavicular region on the left side an incision was made and carried down to later the prepectoral fascia using electrocautery and sharp dissection a pocket was formed similarly. Hemostasis was obtained.  After this, we turned our attention to gaining accessm to the extrathoracic,left subclavian vein. This was accomplished without difficulty and without the aspiration of air or puncture of the artery. 2 separate venipunctures were accomplished; guidewires were placed and retained and sequentially 7 French sheath through which were  passed a Medtronic MRI compatible 5076 ventricular lead serial [id number] and an Medtronic MRI compatible  atrial lead serial number [id number] .  The ventricular lead was manipulated to the right ventricular apex with a bipolar R wave was 14, the pacing impedance was 1100, the threshold was 0.6 @ 0.5 msec  Current at threshold was   0.5  Ma and the current of injury was  BRISK.  The right atrial lead was manipulated to the right atrial FREE WALL as the RAA failed to deliver injury current with a bipolar P-wave  1.4, the pacing impedance was 780, the threshold 1.4@ 0.5 msec   Current at threshold was 2.0  Ma and the current of injury was brisk.  The leads were affixed to the prepectoral fascia and attached to a  Medtronic MRI compatible  pulse generator serial number [id number]  Hemostasis was obtained. The pocket was copiously irrigated with antibiotic containing saline solution. The leads and the pulse generator were placed in the pocket and affixed to the prepectoral fascia. The wound was then closed in 2 layers in the normal fashion.  The wound was washed dried . And a dermabond adhesive was applied  EBL minimal   Needle  Count, sponge counts and  instrument counts were correct at the end of the procedure .   The patient tolerated the procedure without apparent complication.  [name].D.

## 2014-07-16 ENCOUNTER — Ambulatory Visit (HOSPITAL_COMMUNITY): Payer: Medicare Other

## 2014-07-16 DIAGNOSIS — I4510 Unspecified right bundle-branch block: Secondary | ICD-10-CM | POA: Diagnosis not present

## 2014-07-16 DIAGNOSIS — I442 Atrioventricular block, complete: Secondary | ICD-10-CM

## 2014-07-16 DIAGNOSIS — Z951 Presence of aortocoronary bypass graft: Secondary | ICD-10-CM | POA: Diagnosis not present

## 2014-07-16 DIAGNOSIS — I2510 Atherosclerotic heart disease of native coronary artery without angina pectoris: Secondary | ICD-10-CM | POA: Diagnosis not present

## 2014-07-16 DIAGNOSIS — I441 Atrioventricular block, second degree: Secondary | ICD-10-CM | POA: Diagnosis not present

## 2014-07-16 LAB — GLUCOSE, CAPILLARY: Glucose-Capillary: 123 mg/dL — ABNORMAL HIGH (ref 70–99)

## 2014-07-16 NOTE — Progress Notes (Signed)
1640 no elevation of L arm above shoulder maintained

## 2014-07-16 NOTE — Progress Notes (Signed)
Utilization Review Completed.Donne Anon T1/14/2016

## 2014-07-16 NOTE — Progress Notes (Signed)
Patient Name: [name]      SUBJECTIVE  Admittedfollowing pacemaker yesterday for advanced 2 HB with subsequent device dependence  Feels much better  Past Medical History  Diagnosis Date  . Hypertension   . Hyperlipidemia   . Prediabetes   . CHF (congestive heart failure) 07/2012; 10/17/2012  . COPD (chronic obstructive pulmonary disease)     a.  History of heavy smoking. PFTs (4/14) with mixed obstructive (COPD) and restrictive (post-ARDS) picture.   . Pneumonia 07/2012    PNA (H1N1 influenza + pneumococcus) in [id number] complicated by respiratory failure/ARDS. Required tracheostomy, now weaned off. Had left empyema requiring chest tube.   . Diabetes mellitus without complication   . History of blood transfusion     "lots since January" (10/17/2012)  . Ischemic cardiomyopathy   . Anemia of chronic disease   . Aortic stenosis     Moderate to severe by echo in 4/14. Bioprosthetic #21 [facility] Ease aortic valve replacement in 4/14.   Marland Kitchen CAD (coronary artery disease)     a. 4/14 CABG: LIMA-LAD, seq SVG-ramus & OM1, SVG-PDA  . Carotid arterial disease     Carotid dopplers (0000000) with A999333 LICA stenosis.   Marland Kitchen AAA (abdominal aortic aneurysm)     5/14 CT showed > 6 cm AAA, also right iliac aneurysm. Not stent graft candidate.   . Esophageal reflux   . Ischemic cardiomyopathy     a. 4/14 LHC: pLAD 95, ost ramus 70-80, mLCx 90, EF 30%  . Chronic systolic heart failure     a. 1/14 ECHO: sev dil LV, EF30-35%, diff HK, mild MR, AS severe, AV grad 35 b. 8/14 ECHO: EF 55-60%, mild biopros AV sten mn grad. 25, RV mild dil, RA mild dil  . Complication of anesthesia     during last surgery had to be given special medicine b/c "something dropped" during surgery   . Anxiety     pt. admits that he has anxiety at times   . Shortness of breath     still goes deer hunting by himself  . OSA (obstructive sleep apnea)     on cpap- every sleep time.   . Chronic kidney disease    increased creatinine recently - being followed, near kidney failure fr. contrast dye   . Prostate cancer      s/p radiation treatment. - (PT. DENIES)Has indwelling foley.   . Arthritis     Scheduled Meds:  Scheduled Meds: . allopurinol  100 mg Oral QHS  . allopurinol  300 mg Oral Daily  . aspirin EC  81 mg Oral Daily  . atorvastatin  80 mg Oral Daily  . bisoprolol  2.5 mg Oral QHS  . budesonide  0.25 mg Nebulization BID  . [START ON 07/17/2014] calcitRIOL  0.25 mcg Oral QODAY  . Chlorhexidine Gluconate Cloth  6 each Topical Q0600  . furosemide  80 mg Oral BID  . mupirocin ointment  1 application Nasal BID  . pantoprazole  40 mg Oral QHS  . potassium chloride SA  20 mEq Oral BID  . tamsulosin  0.4 mg Oral QHS   Continuous Infusions:  acetaminophen, albuterol, nitroGLYCERIN, ondansetron (ZOFRAN) IV    PHYSICAL EXAM Filed Vitals:   07/16/14 0500 07/16/14 0600 07/16/14 0623 07/16/14 0700  BP:  113/65    Pulse: 81 79 84 81  Temp:      TempSrc:      Resp: 22 27 21  25  Height:      Weight:      SpO2: 96% 100% 96% 95%    Well developed and nourished in no acute distress HENT normal Neck supple with JVP-flat Clear weirtghiout hhematoma Regular rate and rhythm, no murmurs or gallops Abd-soft with active BS No Clubbing cyanosis edema Skin-warm and dry A & Oriented  Grossly normal sensory and motor function  TELEMETRY: Reviewed telemetry pt in  P-synchronous/ AV  pacing     Intake/Output Summary (Last 24 hours) at 07/16/14 0721 Last data filed at 07/16/14 0600  Gross per 24 hour  Intake   1532 ml  Output   1900 ml  Net   -368 ml    LABS: BNP: BNP (last 3 results)  Recent Labs  08/11/13 1626 09/03/13 1040 02/24/14 1558  PROBNP 166.0* 1037.0* 387.1*     Device Interrogation: normal device function    ASSESSMENT AND PLAN:  Active Problems:   Symptomatic infraHisian 2 degree heart block   Complete heart block  Much improved   Transfer to  floor  Signed, Virl Axe MD  07/16/2014  \

## 2014-07-16 NOTE — Progress Notes (Signed)
1525 report received from Crystal<RN  1557 arrived to 3e 03 via wheelchair fully awake ,alert and oriented . Ambulatory without distress Made CMT aware of the pt

## 2014-07-17 ENCOUNTER — Other Ambulatory Visit (HOSPITAL_COMMUNITY): Payer: Self-pay | Admitting: Cardiology

## 2014-07-17 DIAGNOSIS — I441 Atrioventricular block, second degree: Secondary | ICD-10-CM | POA: Diagnosis not present

## 2014-07-17 DIAGNOSIS — I5022 Chronic systolic (congestive) heart failure: Secondary | ICD-10-CM

## 2014-07-17 MED ORDER — BISOPROLOL FUMARATE 5 MG PO TABS: 2.5000 mg | ORAL_TABLET | Freq: Every day | ORAL | Status: DC

## 2014-07-17 NOTE — Discharge Summary (Signed)
ELECTROPHYSIOLOGY PROCEDURE DISCHARGE SUMMARY    Patient ID: [name],  MRN: [id number], DOB/AGE: 10/11/1940 74 y.o.  Admit date: 07/15/2014 Discharge date: 07/17/2014  Primary Care Physician: [name], MD Primary Cardiologist: [name] Electrophysiologist: Caryl Comes  Primary Discharge Diagnosis:  Symptomatic bradycardia status post pacemaker implantation this admission  Secondary Discharge Diagnosis:  1.  CAD s/p CABG 2.  Aortic valve disease s/p AVR 3.  COPD 4.  AAA s/p repair 5.  Pulmonary fibrosis  Allergies  Allergen Reactions  . Omnipaque [Iohexol] Other (See Comments)    Decreased kidney function  . Primaxin [Imipenem] Hives  . Cephalosporins Rash    Blisters  . Clindamycin/Lincomycin      Procedures This Admission:  1.  Implantation of a Medtronic dual chamber PPM on 07-15-2014 by Dr Caryl Comes.  The patient received a MDT model number Advisa PPM with model number 5076 right atrial lead and 5076 right ventricular lead. There were no immediate post procedure complications. 2.  CXR on 07-16-2014 demonstrated no pneumothorax status post device implantation.   Brief HPI: [name] is a 74 y.o. male was referred to electrophysiology in the outpatient setting for consideration of PPM implantation.  He has developed symptomatic bradycardia documented by holter monitor and associated with exercise intolerance and fatigue. The patient has had symptomatic bradycardia without reversible causes identified.  Risks, benefits, and alternatives to PPM implantation were reviewed with the patient who wished to proceed.   Hospital Course:  The patient was admitted and underwent implantation of a MDT dual chamber pacemaker with details as outlined above.  He  was monitored on telemetry overnight which demonstrated sinus rhythm with ventricular pacing.  Left chest was without hematoma or ecchymosis.  The device was interrogated and found to be functioning normally.  CXR was  obtained and demonstrated no pneumothorax status post device implantation.  Wound care, arm mobility, and restrictions were reviewed with the patient. He was kept an additional 24 hours due to device dependence.  Dr Caryl Comes examined the patient and considered them stable for discharge to home.   No complaints this am   Discharge Vitals: Blood pressure 100/59, pulse 86, temperature 98 F (36.7 C), temperature source Oral, resp. rate 20, height 5\' 5"  (1.651 m), weight 214 lb 8.1 oz (97.3 kg), SpO2 100 %.   Well developed and nourished in no acute distress HENT normal Neck supple with JVP-flat Clear Device pocket ; without hematoma or erythema.   Steri strips applied Regular rate and rhythm, no murmurs or gallops Abd-soft with active BS No Clubbing cyanosis edema Skin-warm and dry A & Oriented  Grossly normal sensory and motor function   Labs:   Lab Results  Component Value Date   WBC 7.3 07/07/2014   HGB 13.7 07/07/2014   HCT 42.9 07/07/2014   MCV 90.8 07/07/2014   PLT 153.0 07/07/2014   No results for input(s): NA, K, CL, CO2, BUN, CREATININE, CALCIUM, PROT, BILITOT, ALKPHOS, ALT, AST, GLUCOSE in the last 168 hours.  Invalid input(s): LABALBU  Discharge Medications:    Medication List    ASK your doctor about these medications        albuterol (2.5 MG/3ML) 0.083% nebulizer solution  Commonly known as:  PROVENTIL  Take 3 mLs (2.5 mg total) by nebulization every 2 (two) hours as needed for wheezing or shortness of breath.     allopurinol 300 MG tablet  Commonly known as:  ZYLOPRIM  Take 300 mg by mouth daily.  allopurinol 100 MG tablet  Commonly known as:  ZYLOPRIM  Take 100 mg by mouth daily. At nighttime to equal 400 daily     aspirin EC 81 MG tablet  Take 81 mg by mouth daily.     aspirin 325 MG EC tablet  Take 1 tablet (325 mg total) by mouth daily.     atorvastatin 80 MG tablet  Commonly known as:  LIPITOR  Take 80 mg by mouth daily.     atorvastatin 80  MG tablet  Commonly known as:  LIPITOR  TAKE 1 TABLET (80 MG TOTAL) BY MOUTH DAILY BEFORE BREAKFAST.     bisoprolol 5 MG tablet  Commonly known as:  ZEBETA  TAKE 1/2 TABLET BY MOUTH AT BEDTIME     bisoprolol 5 MG tablet  Commonly known as:  ZEBETA  TAKE 1/2 TABLET BY MOUTH AT BEDTIME     budesonide 0.25 MG/2ML nebulizer solution  Commonly known as:  PULMICORT  Take 2 mLs (0.25 mg total) by nebulization 2 (two) times daily. Dx 496     calcitRIOL 0.25 MCG capsule  Commonly known as:  ROCALTROL  Take 0.25 mcg by mouth every other day.     furosemide 80 MG tablet  Commonly known as:  LASIX  TAKE 1 TABLET BY MOUTH TWICE A DAY     nitroGLYCERIN 0.4 MG SL tablet  Commonly known as:  NITROSTAT  Place 1 tablet (0.4 mg total) under the tongue every 5 (five) minutes as needed for chest pain.     pantoprazole 40 MG tablet  Commonly known as:  PROTONIX  Take 40 mg by mouth at bedtime.     potassium chloride SA 20 MEQ tablet  Commonly known as:  K-DUR,KLOR-CON  Take 20 mEq by mouth 2 (two) times daily.     tamsulosin 0.4 MG Caps capsule  Commonly known as:  FLOMAX  Take 0.4 mg by mouth at bedtime.        Disposition: for discharge    Duration of Discharge Encounter: Greater than 30 minutes including physician time.  Signed,

## 2014-07-17 NOTE — Progress Notes (Signed)
Discharge instruction to pt and family. Wheeled to lobby by Nt

## 2014-07-17 NOTE — Discharge Instructions (Signed)
° °  Supplemental Discharge Instructions for  Pacemaker/Defibrillator Patients  Activity No heavy lifting or vigorous activity with your left/right arm for 6 to 8 weeks.  Do not raise your left/right arm above your head for one week.  Gradually raise your affected arm as drawn below.                       01/18                   01/19                    01/20                    01/21            NO DRIVING for  1 week    ; you may begin driving on     [id number]     . WOUND CARE - Keep the wound area clean and dry.  Do not get this area wet for one week. No showers for one week; you may shower on      01/22        . - The tape/steri-strips on your wound will fall off; do not pull them off.  No bandage is needed on the site.  DO  NOT apply any creams, oils, or ointments to the wound area. - If you notice any drainage or discharge from the wound, any swelling or bruising at the site, or you develop a fever > 101? F after you are discharged home, call the office at once.  Special Instructions - You are still able to use cellular telephones; use the ear opposite the side where you have your pacemaker/defibrillator.  Avoid carrying your cellular phone near your device. - When traveling through airports, show security personnel your identification card to avoid being screened in the metal detectors.  Ask the security personnel to use the hand wand. - Avoid arc welding equipment, MRI testing (magnetic resonance imaging), TENS units (transcutaneous nerve stimulators).  Call the office for questions about other devices. - Avoid electrical appliances that are in poor condition or are not properly grounded. - Microwave ovens are safe to be near or to operate.  Additional information for defibrillator patients should your device go off: - If your device goes off ONCE and you feel fine afterward, notify the device clinic nurses. - If your device goes off ONCE and you do not feel well afterward, call 911. - If  your device goes off TWICE, call 911. - If your device goes off THREE times in one day, call 911.  DO NOT DRIVE YOURSELF OR A FAMILY MEMBER WITH A DEFIBRILLATOR TO THE HOSPITAL--CALL 911.

## 2014-07-21 ENCOUNTER — Ambulatory Visit: Payer: Medicare Other | Admitting: Vascular Surgery

## 2014-07-21 ENCOUNTER — Encounter (HOSPITAL_COMMUNITY): Payer: Medicare Other

## 2014-07-21 ENCOUNTER — Other Ambulatory Visit (HOSPITAL_COMMUNITY): Payer: Medicare Other

## 2014-07-21 ENCOUNTER — Other Ambulatory Visit: Payer: Medicare Other

## 2014-07-23 ENCOUNTER — Telehealth: Payer: Self-pay | Admitting: Adult Health

## 2014-07-23 NOTE — Telephone Encounter (Signed)
Called number given on PA form [phone] Member ID# [id number] Rx Grp [id number] Medication : Budesonide .25mg /8mL susp #158mL (1 vial twice daily ) Was transferred to PA dept to initiate PA  Spoke with [facility] with PA dept, gave Dr information. Advised that I was being transferred to Clinical Dept for PA initiation, placed on long hold and call was disconnected.  Will hold in triage to call back this afternoon.  WCB

## 2014-07-27 ENCOUNTER — Ambulatory Visit: Payer: Medicare Other

## 2014-07-28 NOTE — Telephone Encounter (Signed)
Attempted to call to do PA. Office does not open until 9am. Will try back later.

## 2014-07-29 NOTE — Telephone Encounter (Signed)
Called number for PA services [phone] Spoke with Bess Harvest Medication will be sent to Clinical Review.  Determination will be faxed (triage) within 72 hour time frame. Will send to [facility] to follow up on as this is an [place] patient.

## 2014-07-30 ENCOUNTER — Ambulatory Visit (INDEPENDENT_AMBULATORY_CARE_PROVIDER_SITE_OTHER): Payer: Medicare Other | Admitting: *Deleted

## 2014-07-30 DIAGNOSIS — I442 Atrioventricular block, complete: Secondary | ICD-10-CM | POA: Diagnosis not present

## 2014-07-30 LAB — MDC_IDC_ENUM_SESS_TYPE_INCLINIC
Brady Statistic AP VS Percent: 0 %
Brady Statistic AS VP Percent: 99.85 %
Brady Statistic AS VS Percent: 0.08 %
Brady Statistic RA Percent Paced: 0.07 %
Brady Statistic RV Percent Paced: 99.92 %
Date Time Interrogation Session: 20160128164736
Lead Channel Impedance Value: 342 Ohm
Lead Channel Impedance Value: 437 Ohm
Lead Channel Impedance Value: 456 Ohm
Lead Channel Impedance Value: 570 Ohm
Lead Channel Pacing Threshold Pulse Width: 0.4 ms
Lead Channel Sensing Intrinsic Amplitude: 3 mV
Lead Channel Setting Pacing Amplitude: 3.5 V
Lead Channel Setting Pacing Amplitude: 3.5 V
Lead Channel Setting Sensing Sensitivity: 2 mV
MDC IDC MSMT BATTERY REMAINING LONGEVITY: 128 mo
MDC IDC MSMT BATTERY VOLTAGE: 3.1 V
MDC IDC MSMT LEADCHNL RA PACING THRESHOLD AMPLITUDE: 0.75 V
MDC IDC MSMT LEADCHNL RA PACING THRESHOLD PULSEWIDTH: 0.4 ms
MDC IDC MSMT LEADCHNL RA SENSING INTR AMPL: 3 mV
MDC IDC MSMT LEADCHNL RV PACING THRESHOLD AMPLITUDE: 0.5 V
MDC IDC MSMT LEADCHNL RV SENSING INTR AMPL: 14.25 mV
MDC IDC MSMT LEADCHNL RV SENSING INTR AMPL: 14.25 mV
MDC IDC SET LEADCHNL RV PACING PULSEWIDTH: 0.4 ms
MDC IDC SET ZONE DETECTION INTERVAL: 350 ms
MDC IDC STAT BRADY AP VP PERCENT: 0.07 %
Zone Setting Detection Interval: 400 ms

## 2014-07-30 NOTE — Progress Notes (Signed)
Wound check appointment. Dermabond removed. Wound without redness or edema. Incision edges approximated, wound well healed. Normal device function. Thresholds, sensing, and impedances consistent with implant measurements. Device programmed at 3.5V/auto capture programmed on for extra safety margin until 3 month visit. Histogram distribution appropriate for patient and level of activity. No mode switches or high ventricular rates noted. Patient educated about wound care, arm mobility, lifting restrictions. ROV in 3 months with implanting physician. ?

## 2014-08-03 ENCOUNTER — Telehealth: Payer: Self-pay | Admitting: Adult Health

## 2014-08-03 DIAGNOSIS — J449 Chronic obstructive pulmonary disease, unspecified: Secondary | ICD-10-CM

## 2014-08-03 MED ORDER — BUDESONIDE 0.25 MG/2ML IN SUSP: 0.2500 mg | Freq: Two times a day (BID) | RESPIRATORY_TRACT | Status: DC

## 2014-08-03 NOTE — Addendum Note (Signed)
Addended by: Parke Poisson E on: 08/03/2014 02:54 PM   Modules accepted: Orders

## 2014-08-03 NOTE — Telephone Encounter (Signed)
Rx printed instead of being sent electronically Will do so

## 2014-08-03 NOTE — Telephone Encounter (Signed)
[facility] Nothing further was needed.

## 2014-08-04 NOTE — Telephone Encounter (Signed)
No, have not seen anything for Mr. Biamonte.

## 2014-08-04 NOTE — Telephone Encounter (Signed)
Did we receive this yet? Thanks.

## 2014-08-06 NOTE — Telephone Encounter (Signed)
Called and spoke to PA rep. I was informed PA was denied because the PA was ran under Medicare part D when pt's Medicare part B will cover the medication. Medication will need to be sent to pharmacy under medicare part B. Per 08/03/2014 phone note medication has already been sent under Medicare part B. Nothing further needed.

## 2014-08-07 MED ORDER — BUDESONIDE 0.25 MG/2ML IN SUSP: 0.2500 mg | Freq: Two times a day (BID) | RESPIRATORY_TRACT | Status: DC

## 2014-08-07 NOTE — Telephone Encounter (Signed)
Had note on my desk from [place] [facility]) requesting budesonide rx be refaxed This has been done via e-fax

## 2014-08-07 NOTE — Addendum Note (Signed)
Addended by: Parke Poisson E on: 08/07/2014 05:41 PM   Modules accepted: Orders

## 2014-08-08 ENCOUNTER — Encounter: Payer: Self-pay | Admitting: Cardiology

## 2014-08-10 ENCOUNTER — Encounter: Payer: Self-pay | Admitting: Vascular Surgery

## 2014-08-10 ENCOUNTER — Telehealth: Payer: Self-pay | Admitting: Adult Health

## 2014-08-10 NOTE — Telephone Encounter (Signed)
Pt returning call.[name] ° °

## 2014-08-10 NOTE — Telephone Encounter (Signed)
Per the 1.28.16 phone note, PA was initiated for pt's Budesonide neb solution Per the 2.1.16 phone note, [facility] now [facility] that the B[facility] PA has been denied  There are 2 issues: the patient has not been seen in the office since 12.16.14, was rec'd to follow up with RA 3 months from that time and nebs typically are not covered when sent to the local pharmacy - should be sent to DME.  Pt's chart shows that he uses O2 through [facility].  Is this still correct?  Will need to discuss the above with patient and schedule appt with RA.  (would send rx's under RA's name).  LMOM TCB x1 for pt.

## 2014-08-11 ENCOUNTER — Ambulatory Visit
Admission: RE | Admit: 2014-08-11 | Discharge: 2014-08-11 | Disposition: A | Discharge status: Home / Self Care | Payer: Medicare Other | Source: Ambulatory Visit | Attending: Vascular Surgery | Admitting: Vascular Surgery

## 2014-08-11 ENCOUNTER — Ambulatory Visit (INDEPENDENT_AMBULATORY_CARE_PROVIDER_SITE_OTHER)
Admission: RE | Admit: 2014-08-11 | Discharge: 2014-08-11 | Disposition: A | Discharge status: Home / Self Care | Payer: Medicare Other | Source: Ambulatory Visit | Attending: Vascular Surgery | Admitting: Vascular Surgery

## 2014-08-11 ENCOUNTER — Ambulatory Visit (INDEPENDENT_AMBULATORY_CARE_PROVIDER_SITE_OTHER): Payer: Medicare Other | Admitting: Vascular Surgery

## 2014-08-11 ENCOUNTER — Other Ambulatory Visit: Payer: Self-pay | Admitting: Vascular Surgery

## 2014-08-11 ENCOUNTER — Encounter: Payer: Self-pay | Admitting: Internal Medicine

## 2014-08-11 ENCOUNTER — Ambulatory Visit (HOSPITAL_COMMUNITY)
Admission: RE | Admit: 2014-08-11 | Discharge: 2014-08-11 | Disposition: A | Discharge status: Home / Self Care | Payer: Medicare Other | Source: Ambulatory Visit | Attending: Vascular Surgery | Admitting: Vascular Surgery

## 2014-08-11 ENCOUNTER — Encounter: Payer: Self-pay | Admitting: Vascular Surgery

## 2014-08-11 VITALS — BP 124/86 | HR 93 | Resp 16 | Ht 65.5 in | Wt 228.0 lb

## 2014-08-11 DIAGNOSIS — I714 Abdominal aortic aneurysm, without rupture, unspecified: Secondary | ICD-10-CM

## 2014-08-11 DIAGNOSIS — Z952 Presence of prosthetic heart valve: Secondary | ICD-10-CM

## 2014-08-11 DIAGNOSIS — I6523 Occlusion and stenosis of bilateral carotid arteries: Secondary | ICD-10-CM | POA: Diagnosis not present

## 2014-08-11 DIAGNOSIS — E119 Type 2 diabetes mellitus without complications: Secondary | ICD-10-CM | POA: Insufficient documentation

## 2014-08-11 DIAGNOSIS — Z48812 Encounter for surgical aftercare following surgery on the circulatory system: Secondary | ICD-10-CM

## 2014-08-11 DIAGNOSIS — Z87891 Personal history of nicotine dependence: Secondary | ICD-10-CM | POA: Diagnosis not present

## 2014-08-11 DIAGNOSIS — E785 Hyperlipidemia, unspecified: Secondary | ICD-10-CM | POA: Insufficient documentation

## 2014-08-11 DIAGNOSIS — Z954 Presence of other heart-valve replacement: Secondary | ICD-10-CM

## 2014-08-11 NOTE — Telephone Encounter (Signed)
LMOMTCB x 1 

## 2014-08-11 NOTE — Progress Notes (Signed)
Subjective:     Patient ID: [name], male   DOB: 1941/03/14, 74 y.o.   MRN: [id number]  HPI This 74 year old male returns for continued follow-up regarding his mild carotid occlusive disease and previous abdominal aortic aneurysm resection. He denies any neurologic symptoms such as lateralizing weakness, aphasia, amaurosis fugax, diplopia, blurred vision, or syncope. He has felt slightly bloated recently in the abdomen. He had a pacemaker inserted by Dr. Jolyn Nap last month for bradycardia. He is not on any anticoagulation medications other than aspirin daily   Past Medical History  Diagnosis Date  . Hypertension   . Hyperlipidemia   . Prediabetes   . CHF (congestive heart failure) 07/2012; 10/17/2012  . COPD (chronic obstructive pulmonary disease)     a.  History of heavy smoking. PFTs (4/14) with mixed obstructive (COPD) and restrictive (post-ARDS) picture.   . Pneumonia 07/2012    PNA (H1N1 influenza + pneumococcus) in [id number] complicated by respiratory failure/ARDS. Required tracheostomy, now weaned off. Had left empyema requiring chest tube.   . Diabetes mellitus without complication   . History of blood transfusion     "lots since January" (10/17/2012)  . Ischemic cardiomyopathy   . Anemia of chronic disease   . Aortic stenosis     Moderate to severe by echo in 4/14. Bioprosthetic #21 [facility] Ease aortic valve replacement in 4/14.   Marland Kitchen CAD (coronary artery disease)     a. 4/14 CABG: LIMA-LAD, seq SVG-ramus & OM1, SVG-PDA  . Carotid arterial disease     Carotid dopplers (0000000) with A999333 LICA stenosis.   Marland Kitchen AAA (abdominal aortic aneurysm)     5/14 CT showed > 6 cm AAA, also right iliac aneurysm. Not stent graft candidate.   . Esophageal reflux   . Ischemic cardiomyopathy     a. 4/14 LHC: pLAD 95, ost ramus 70-80, mLCx 90, EF 30%  . Chronic systolic heart failure     a. 1/14 ECHO: sev dil LV, EF30-35%, diff HK, mild MR, AS severe, AV grad 35 b. 8/14 ECHO: EF 55-60%, mild  biopros AV sten mn grad. 25, RV mild dil, RA mild dil  . Complication of anesthesia     during last surgery had to be given special medicine b/c "something dropped" during surgery   . Anxiety     pt. admits that he has anxiety at times   . Shortness of breath     still goes deer hunting by himself  . OSA (obstructive sleep apnea)     on cpap- every sleep time.   . Chronic kidney disease     increased creatinine recently - being followed, near kidney failure fr. contrast dye   . Prostate cancer      s/p radiation treatment. - (PT. DENIES)Has indwelling foley.   . Arthritis     History  Substance Use Topics  . Smoking status: Former Smoker -- 2.00 packs/day for 58 years    Types: Cigarettes    Quit date: 07/20/2012  . Smokeless tobacco: Never Used  . Alcohol Use: No     Comment: 10/17/2012 "years since I've had a drink; never had problem with it"    Family History  Problem Relation Age of Onset  . Congestive Heart Failure    . Heart disease    . Cancer Mother   . Heart disease Father     Allergies  Allergen Reactions  . Omnipaque [Iohexol] Other (See Comments)    Decreased kidney function  .  Primaxin [Imipenem] Hives  . Cephalosporins Rash    Blisters  . Clindamycin/Lincomycin      Current outpatient prescriptions:  .  albuterol (PROVENTIL) (2.5 MG/3ML) 0.083% nebulizer solution, Take 3 mLs (2.5 mg total) by nebulization every 2 (two) hours as needed for wheezing or shortness of breath., Disp: , Rfl:  .  allopurinol (ZYLOPRIM) 100 MG tablet, Take 100 mg by mouth daily. At nighttime to equal 400 daily, Disp: , Rfl:  .  allopurinol (ZYLOPRIM) 300 MG tablet, Take 300 mg by mouth daily., Disp: , Rfl:  .  aspirin EC 81 MG tablet, Take 81 mg by mouth daily., Disp: , Rfl:  .  atorvastatin (LIPITOR) 80 MG tablet, Take 80 mg by mouth daily., Disp: , Rfl:  .  bisoprolol (ZEBETA) 5 MG tablet, Take 0.5 tablets (2.5 mg total) by mouth at bedtime., Disp: 15 tablet, Rfl: 3 .   budesonide (PULMICORT) 0.25 MG/2ML nebulizer solution, Take 2 mLs (0.25 mg total) by nebulization 2 (two) times daily. Dx J44.9, Disp: 120 mL, Rfl: 12 .  calcitRIOL (ROCALTROL) 0.25 MCG capsule, Take 0.25 mcg by mouth every other day., Disp: , Rfl:  .  furosemide (LASIX) 80 MG tablet, TAKE 1 TABLET BY MOUTH TWICE A DAY, Disp: 60 tablet, Rfl: 11 .  nitroGLYCERIN (NITROSTAT) 0.4 MG SL tablet, Place 1 tablet (0.4 mg total) under the tongue every 5 (five) minutes as needed for chest pain., Disp: 25 tablet, Rfl: 12 .  pantoprazole (PROTONIX) 40 MG tablet, Take 40 mg by mouth at bedtime. , Disp: , Rfl:  .  potassium chloride SA (K-DUR,KLOR-CON) 20 MEQ tablet, Take 20 mEq by mouth 2 (two) times daily., Disp: , Rfl:  .  tamsulosin (FLOMAX) 0.4 MG CAPS capsule, Take 0.4 mg by mouth at bedtime. , Disp: , Rfl:   BP 124/86 mmHg  Pulse 93  Resp 16  Ht 5' 5.5" (1.664 m)  Wt 228 lb (103.42 kg)  BMI 37.35 kg/m2  Body mass index is 37.35 kg/(m^2).           Review of Systems  Has dyspnea on exertion which is chronic. Denies chest pain. Has had no syncope. No claudication symptoms but patient does not ambulate significant distances. See history of present illness -also other systems negative and complete review of systems     Objective:   Physical Exam BP 124/86 mmHg  Pulse 93  Resp 16  Ht 5' 5.5" (1.664 m)  Wt 228 lb (103.42 kg)  BMI 37.35 kg/m2  Gen.-alert and oriented x3 in no apparent distress -obese HEENT normal for age Lungs no rhonchi or wheezing Cardiovascular regular rhythm no murmurs carotid pulses 3+ palpable no bruits audible Abdomen soft nontender no palpable masses Musculoskeletal free of  major deformities Skin clear -no rashes Neurologic normal Lower extremities 3+ femoral and dorsalis pedis pulses palpable bilaterally with  1+ edema     Today I ordered a carotid duplex exam and ABIs which I reviewed and interpreted. He also had a CT scan of the abdomen and pelvis. There  is no problem with the aortic aneurysm repair. Carotid artery stenosis on the left is mild in the 40-50% range. ABIs in both legs exceed 1.0 with triphasic flow.       Assessment:      doing well post abdominal aortic aneurysm resection   stable mild carotid occlusive disease-asymptomatic   recent bradycardia requiring pacemaker insertion by Dr. Jolyn Nap -follow-up ongoing       Plan:  return in one year for repeat carotid duplex exam and see nurse practitioner

## 2014-08-11 NOTE — Telephone Encounter (Signed)
Pt returned call - he stated that he was getting one neb thru [facility] and one [facility].  Was told that he has to get both nebs thru the same company.  So he has [facility] thru Medicare Part B and this is covered.  Nothing further needed regarding this.  Appt scheduled w/ RA for 3.22.16 at 1115 for follow up  Will sign off

## 2014-08-11 NOTE — Addendum Note (Signed)
Addended by: Mena Goes on: 08/11/2014 04:33 PM   Modules accepted: Orders

## 2014-08-17 ENCOUNTER — Other Ambulatory Visit: Payer: Self-pay | Admitting: Cardiology

## 2014-09-03 ENCOUNTER — Other Ambulatory Visit: Payer: Self-pay | Admitting: Adult Health

## 2014-09-22 ENCOUNTER — Encounter: Payer: Self-pay | Admitting: Pulmonary Disease

## 2014-09-22 ENCOUNTER — Ambulatory Visit (INDEPENDENT_AMBULATORY_CARE_PROVIDER_SITE_OTHER): Payer: Medicare Other | Admitting: Pulmonary Disease

## 2014-09-22 VITALS — BP 122/84 | HR 81 | Ht 65.5 in | Wt 234.0 lb

## 2014-09-22 DIAGNOSIS — I6523 Occlusion and stenosis of bilateral carotid arteries: Secondary | ICD-10-CM | POA: Diagnosis not present

## 2014-09-22 DIAGNOSIS — J439 Emphysema, unspecified: Secondary | ICD-10-CM

## 2014-09-22 NOTE — Patient Instructions (Signed)
[facility] under [facility] If still expensive, then OK to STOP Trial of spiriva -sample Let us know in 1 week if this is expensive If so, we will send Rx for albuterol-atrovent combination neb to use 2-3 /day

## 2014-09-22 NOTE — Assessment & Plan Note (Signed)
[facility] If still expensive, then OK to STOP Trial of spiriva -sample Let us know in 1 week if this is expensive If so, we will send Rx for albuterol-atrovent combination neb to use 2-3 /day

## 2014-09-22 NOTE — Progress Notes (Signed)
   Subjective:    Patient ID: [name], male    DOB: 12/20/1940, 74 y.o.   MRN: [id number]  HPI  74 yo ex-heavy smoker for FU of COPD & OSA. He smoked 2-3 PPD prior to his admission in Jan but has quit since.  He had prolonged hospitalization in 07/2012 with H1N1 influenza and pneumococcal PNA/ ARDS requiring tracheostomy. He also had a left empyema requiring chest tube, septic shock with elevated troponin, and AKI which resolved.   He had aortic stenosis - moderate to severe & severe 3 vessel disease on cath. He then had CABG-AVR (bioprosthetic valve). He was discharged on home O2.  Echo in 10/2012  showed EF up to 50%.     Admitted 12/2012 with hyperkalemia 6.5 and elevated creatinine 4.7 thought to be from contrast given with CTA abdomen/pelvis. Coreg, hydralazine, IMDUR, Torsemide were stopped. He was started on 160 mg of lasix twice a day. Discharge weight was 207 pounds  Significant tests/ events  10/23/12 PFTs reveal severe obstructive / restrictive disease & severely reduced DLCO.   PFT 01/21/13 >FEV1 58%, ratio 58, +25% change after BD , DLCO 38 .    09/22/2014  Chief Complaint  Patient presents with  . Follow-up    Breathing has been okay, tried [facility], insurance denied.       1 yr FU Underwent AAA surgery - complicated by AKI - now cr 1.6 last 07/2014 CT abd 08/2014 reviewed Also required PPM  Did not require O2 x 3 yrs, completed rehab Denies any wheezing, no chest tx Patient wears CPAP every night for at least 6 hours. Says he feels rested the next day. Patient continues on budesonide nebulizer twice daily. He uses albuterol  nebulizer 3 times a day. He was given a trial of Breo in 2014 , but was unable to afford this inhaler.   Review of Systems neg for any significant sore throat, dysphagia, itching, sneezing, nasal congestion or excess/ purulent secretions, fever, chills, sweats, unintended wt loss, pleuritic or exertional cp,  hempoptysis, orthopnea pnd or change in chronic leg swelling. Also denies presyncope, palpitations, heartburn, abdominal pain, nausea, vomiting, diarrhea or change in bowel or urinary habits, dysuria,hematuria, rash, arthralgias, visual complaints, headache, numbness weakness or ataxia.     Objective:   Physical Exam  Gen. Pleasant, obese, in no distress ENT - no lesions, no post nasal drip Neck: No JVD, no thyromegaly, no carotid bruits Lungs: no use of accessory muscles, no dullness to percussion, decreased without rales or rhonchi  Cardiovascular: Rhythm regular, heart sounds  normal, no murmurs or gallops, no peripheral edema Musculoskeletal: No deformities, no cyanosis or clubbing , no tremors       Assessment & Plan:

## 2014-09-25 ENCOUNTER — Other Ambulatory Visit: Payer: Self-pay | Admitting: Cardiovascular Disease

## 2014-10-06 ENCOUNTER — Telehealth: Payer: Self-pay | Admitting: Pulmonary Disease

## 2014-10-06 MED ORDER — IPRATROPIUM-ALBUTEROL 0.5-2.5 (3) MG/3ML IN SOLN: 3.0000 mL | RESPIRATORY_TRACT | Status: DC | PRN

## 2014-10-06 NOTE — Addendum Note (Signed)
Addended by: Mathis Dad on: 10/06/2014 05:18 PM   Modules accepted: Orders

## 2014-10-06 NOTE — Telephone Encounter (Signed)
Rx sent to pharmacy. Patient notified. Nothing further needed.  

## 2014-10-06 NOTE — Telephone Encounter (Signed)
ok 

## 2014-10-06 NOTE — Telephone Encounter (Addendum)
Patient says that the Spiriva is too expensive and would like RX for Albuterol/Atrovent neb.  Ok for [employer] of Albuterol/Atrovent neb?   RA - please advise.

## 2014-10-07 ENCOUNTER — Other Ambulatory Visit: Payer: Self-pay | Admitting: Pulmonary Disease

## 2014-10-07 ENCOUNTER — Telehealth: Payer: Self-pay | Admitting: Pulmonary Disease

## 2014-10-07 NOTE — Telephone Encounter (Signed)
Spoke with pt, states that he was supposed to have a rx for albuterol-atrovent nebulizer, and albuterol-ipratropium was sent. I advised pt that ipratropium is atrovent and that this is the correct med. Nothing further needed.

## 2014-10-09 ENCOUNTER — Other Ambulatory Visit: Payer: Self-pay | Admitting: Adult Health

## 2014-10-12 ENCOUNTER — Other Ambulatory Visit: Payer: Self-pay | Admitting: *Deleted

## 2014-10-12 NOTE — Telephone Encounter (Signed)
Budesonide refilled.

## 2014-10-26 ENCOUNTER — Ambulatory Visit (INDEPENDENT_AMBULATORY_CARE_PROVIDER_SITE_OTHER): Payer: Medicare Other | Admitting: Internal Medicine

## 2014-10-26 ENCOUNTER — Encounter: Payer: Self-pay | Admitting: Internal Medicine

## 2014-10-26 VITALS — BP 106/70 | HR 87 | Ht 65.5 in | Wt 234.8 lb

## 2014-10-26 DIAGNOSIS — I441 Atrioventricular block, second degree: Secondary | ICD-10-CM | POA: Diagnosis not present

## 2014-10-26 DIAGNOSIS — Z45018 Encounter for adjustment and management of other part of cardiac pacemaker: Secondary | ICD-10-CM | POA: Diagnosis not present

## 2014-10-26 DIAGNOSIS — I6523 Occlusion and stenosis of bilateral carotid arteries: Secondary | ICD-10-CM

## 2014-10-26 DIAGNOSIS — I442 Atrioventricular block, complete: Secondary | ICD-10-CM | POA: Diagnosis not present

## 2014-10-26 LAB — MDC_IDC_ENUM_SESS_TYPE_INCLINIC
Battery Voltage: 3.05 V
Brady Statistic AP VP Percent: 0.12 %
Brady Statistic AP VS Percent: 0 %
Brady Statistic AS VS Percent: 0.1 %
Brady Statistic RV Percent Paced: 99.9 %
Date Time Interrogation Session: 20160425165457
Lead Channel Impedance Value: 437 Ohm
Lead Channel Pacing Threshold Amplitude: 0.5 V
Lead Channel Pacing Threshold Pulse Width: 0.4 ms
Lead Channel Pacing Threshold Pulse Width: 0.4 ms
Lead Channel Sensing Intrinsic Amplitude: 9.75 mV
Lead Channel Setting Pacing Amplitude: 1.5 V
Lead Channel Setting Pacing Amplitude: 2 V
Lead Channel Setting Pacing Pulse Width: 0.4 ms
Lead Channel Setting Sensing Sensitivity: 2 mV
MDC IDC MSMT BATTERY REMAINING LONGEVITY: 134 mo
MDC IDC MSMT LEADCHNL RA IMPEDANCE VALUE: 361 Ohm
MDC IDC MSMT LEADCHNL RA SENSING INTR AMPL: 2.625 mV
MDC IDC MSMT LEADCHNL RA SENSING INTR AMPL: 3.75 mV
MDC IDC MSMT LEADCHNL RV IMPEDANCE VALUE: 475 Ohm
MDC IDC MSMT LEADCHNL RV IMPEDANCE VALUE: 532 Ohm
MDC IDC MSMT LEADCHNL RV PACING THRESHOLD AMPLITUDE: 0.5 V
MDC IDC MSMT LEADCHNL RV SENSING INTR AMPL: 9.75 mV
MDC IDC STAT BRADY AS VP PERCENT: 99.78 %
MDC IDC STAT BRADY RA PERCENT PACED: 0.12 %
Zone Setting Detection Interval: 350 ms
Zone Setting Detection Interval: 400 ms

## 2014-10-26 NOTE — Patient Instructions (Addendum)
Medication Instructions:  Your physician recommends that you continue on your current medications as directed. Please refer to the Current Medication list given to you today.  Labwork: None  Testing/Procedures: None  Follow-Up: Your physician recommends that you schedule a follow-up appointment in: 4 months with Dr. [name]  Remote monitoring is used to monitor your Pacemaker of ICD from home. This monitoring reduces the number of office visits required to check your device to one time per year. It allows [place] to keep an eye on the functioning of your device to ensure it is working properly. You are scheduled for a device check from home on 01/25/15. You may send your transmission at any time that day. If you have a wireless device, the transmission will be sent automatically. After your physician reviews your transmission, you will receive a postcard with your next transmission date.  Your physician wants you to follow-up in: 9 months with Dr. Caryl Comes.  You will receive a reminder letter in the mail two months in advance. If you don't receive a letter, please call our office to schedule the follow-up appointment.   Any Other Special Instructions Will Be Listed Below (If Applicable).

## 2014-10-26 NOTE — Progress Notes (Signed)
Patient Care Team: [name], MD as PCP - General (Internal Medicine) Larey Dresser, MD as Attending Physician (Cardiology)   HPI  [name] is a 74 y.o. 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 He underwent device implantation January 2016.  Cardiac evaluation has included an echocardiogram 1/15 demonstrated near-normal LV function.  He is past medical history is notable for ARDS requiring tracheostomy.  He has a history of acute renal failure with subsequent normalization.  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  Feeling better with less lightheadedness.  Past Medical History  Diagnosis Date  . Hypertension   . Hyperlipidemia   . Prediabetes   . CHF (congestive heart failure) 07/2012; 10/17/2012  . COPD (chronic obstructive pulmonary disease)     a.  History of heavy smoking. PFTs (4/14) with mixed obstructive (COPD) and restrictive (post-ARDS) picture.   . Pneumonia 07/2012    PNA (H1N1 influenza + pneumococcus) in [id number] complicated by respiratory failure/ARDS. Required tracheostomy, now weaned off. Had left empyema requiring chest tube.   . Diabetes mellitus without complication   . History of blood transfusion     "lots since January" (10/17/2012)  . Ischemic cardiomyopathy   . Anemia of chronic disease   . Aortic stenosis     Moderate to severe by echo in 4/14. Bioprosthetic #21 [facility] Ease aortic valve replacement in 4/14.   Marland Kitchen CAD (coronary artery disease)     a. 4/14 CABG: LIMA-LAD, seq SVG-ramus & OM1, SVG-PDA  . Carotid arterial disease     Carotid dopplers (0000000) with A999333 LICA stenosis.   Marland Kitchen AAA (abdominal aortic aneurysm)     5/14 CT showed > 6 cm AAA, also right iliac aneurysm. Not stent graft candidate.   . Esophageal reflux   . Ischemic cardiomyopathy     a. 4/14 LHC: pLAD 95, ost ramus 70-80, mLCx 90, EF 30%  .  Chronic systolic heart failure     a. 1/14 ECHO: sev dil LV, EF30-35%, diff HK, mild MR, AS severe, AV grad 35 b. 8/14 ECHO: EF 55-60%, mild biopros AV sten mn grad. 25, RV mild dil, RA mild dil  . Complication of anesthesia     during last surgery had to be given special medicine b/c "something dropped" during surgery   . Anxiety     pt. admits that he has anxiety at times   . Shortness of breath     still goes deer hunting by himself  . OSA (obstructive sleep apnea)     on cpap- every sleep time.   . Chronic kidney disease     increased creatinine recently - being followed, near kidney failure fr. contrast dye   . Prostate cancer      s/p radiation treatment. - (PT. DENIES)Has indwelling foley.   . Arthritis     Past Surgical History  Procedure Laterality Date  . Transurethral microwave therapy  10/15/2012  . Nasal fracture surgery  1970's  . Tracheostomy  07/2012  . Tracheostomy closure  08/2012  . Anal fissure repair  2008  . Coronary artery bypass graft N/A 10/25/2012    Procedure: CORONARY ARTERY BYPASS GRAFTING (CABG);  Surgeon: Melrose Nakayama, MD;  Location: [place];  Service: Open Heart Surgery;  Laterality: N/A;  times 4 using left internal mammary artery and endoscopically harvested bilateral saphenous vein   . Aortic valve  replacement N/A 10/25/2012    Procedure: AORTIC VALVE REPLACEMENT (AVR);  Surgeon: Melrose Nakayama, MD;  Location: [place];  Service: Open Heart Surgery;  Laterality: N/A;  . Intraoperative transesophageal echocardiogram N/A 10/25/2012    Procedure: INTRAOPERATIVE TRANSESOPHAGEAL ECHOCARDIOGRAM;  Surgeon: Melrose Nakayama, MD;  Location: [place];  Service: Open Heart Surgery;  Laterality: N/A;  . Cardiac valve replacement    . Abdominal aortic aneurysm repair N/A 07/10/2013    Procedure: Resection and Graftiong of perirenal AAA; Insertion 14 x 8 Hemashield Graft Aorta to Left Common Iliac and to Right Common Femoral Artery With Ligastion of Right  External and Interanl Iliac Artery;  Surgeon: Mal Misty, MD;  Location: [facility] OR;  Service: Vascular;  Laterality: N/A;  . Left and right heart catheterization with coronary angiogram N/A 10/22/2012    Procedure: LEFT AND RIGHT HEART CATHETERIZATION WITH CORONARY ANGIOGRAM;  Surgeon: Larey Dresser, MD;  Location: [facility] CATH LAB;  Service: Cardiovascular;  Laterality: N/A;  . Permanent pacemaker insertion N/A 07/15/2014    Procedure: PERMANENT PACEMAKER INSERTION;  Surgeon: Deboraha Sprang, MD;  Location: [facility] CATH LAB;  Service: Cardiovascular;  Laterality: N/A;    Current Outpatient Prescriptions  Medication Sig Dispense Refill  . allopurinol (ZYLOPRIM) 100 MG tablet Take 100 mg by mouth daily. At nighttime to equal 400 daily    . allopurinol (ZYLOPRIM) 300 MG tablet Take 300 mg by mouth daily.    Marland Kitchen aspirin EC 81 MG tablet Take 81 mg by mouth daily.    Marland Kitchen atorvastatin (LIPITOR) 80 MG tablet TAKE 1 TABLET (80 MG TOTAL) BY MOUTH DAILY BEFORE BREAKFAST. 30 tablet 6  . bisoprolol (ZEBETA) 5 MG tablet Take 0.5 tablets (2.5 mg total) by mouth at bedtime. 15 tablet 3  . budesonide (PULMICORT) 0.25 MG/2ML nebulizer solution INHALE 1 VIAL VIA NEBULIZER TWICE A DAY (EVERY 12 HOURS) 120 mL 11  . calcitRIOL (ROCALTROL) 0.25 MCG capsule Take 0.25 mcg by mouth every other day.    . furosemide (LASIX) 80 MG tablet TAKE 1 TABLET BY MOUTH TWICE A DAY 60 tablet 11  . ipratropium-albuterol (DUONEB) 0.5-2.5 (3) MG/3ML SOLN Take 3 mLs by nebulization every 4 (four) hours as needed. 360 mL 1  . nitroGLYCERIN (NITROSTAT) 0.4 MG SL tablet Place 1 tablet (0.4 mg total) under the tongue every 5 (five) minutes as needed for chest pain. 25 tablet 12  . pantoprazole (PROTONIX) 40 MG tablet Take 40 mg by mouth at bedtime.     . potassium chloride (K-DUR,KLOR-CON) 10 MEQ tablet Take 20 mEq by mouth 2 (two) times daily. TAKE 2 TABLETS (20 meq) BY MOUTH TWICE A DAY, FOR A TOTAL OF 40 meq DAILY.    . tamsulosin (FLOMAX) 0.4 MG CAPS  capsule Take 0.4 mg by mouth at bedtime.      No current facility-administered medications for this visit.    Allergies  Allergen Reactions  . Omnipaque [Iohexol] Other (See Comments)    Decreased kidney function  . Primaxin [Imipenem] Hives  . Cephalosporins Rash    Blisters  . Clindamycin/Lincomycin     Review of Systems negative except from HPI and PMH  Physical Exam BP 106/70 mmHg  Pulse 87  Ht 5' 5.5" (1.664 m)  Wt 234 lb 12.8 oz (106.505 kg)  BMI 38.46 kg/m2 Well developed and well nourished in no acute distress HENT normal E scleral and icterus clear Neck Supple JVP flat; carotids brisk and full Clear to ausculation Device pocket well healed;  without hematoma or erythema.  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  The information was reviewed. No changes were made in the programming.     Ischemic heart disease with prior bypass surgery  Without symptoms of ischemia  Improved exercise tolerance  Discussed the importance of weight loss and portion control.

## 2014-10-27 ENCOUNTER — Other Ambulatory Visit: Payer: Self-pay | Admitting: Physician Assistant

## 2014-11-04 ENCOUNTER — Other Ambulatory Visit (HOSPITAL_COMMUNITY): Payer: Self-pay | Admitting: Internal Medicine

## 2014-11-06 ENCOUNTER — Other Ambulatory Visit: Payer: Self-pay | Admitting: Adult Health

## 2014-11-06 NOTE — Telephone Encounter (Signed)
Last information in chart is rx for 10 mEq tablets. Patient is to take 2 tablets (20 mEq total) twice daily. This would make 40 mEq total for the day. Please call patient to verify before refilling. Thanks

## 2014-11-09 ENCOUNTER — Other Ambulatory Visit: Payer: Self-pay | Admitting: Adult Health

## 2015-01-25 ENCOUNTER — Ambulatory Visit (INDEPENDENT_AMBULATORY_CARE_PROVIDER_SITE_OTHER): Payer: Medicare Other | Admitting: *Deleted

## 2015-01-25 DIAGNOSIS — I442 Atrioventricular block, complete: Secondary | ICD-10-CM

## 2015-02-02 LAB — CUP PACEART REMOTE DEVICE CHECK
Battery Remaining Longevity: 123 mo
Battery Voltage: 3.03 V
Brady Statistic AP VP Percent: 0.22 %
Brady Statistic AS VP Percent: 99.69 %
Brady Statistic AS VS Percent: 0.09 %
Brady Statistic RV Percent Paced: 99.91 %
Date Time Interrogation Session: 20160725114530
Lead Channel Impedance Value: 361 Ohm
Lead Channel Impedance Value: 513 Ohm
Lead Channel Pacing Threshold Pulse Width: 0.4 ms
Lead Channel Sensing Intrinsic Amplitude: 3.875 mV
Lead Channel Sensing Intrinsic Amplitude: 3.875 mV
Lead Channel Sensing Intrinsic Amplitude: 8.25 mV
Lead Channel Sensing Intrinsic Amplitude: 8.25 mV
Lead Channel Setting Pacing Amplitude: 2 V
Lead Channel Setting Pacing Pulse Width: 0.4 ms
Lead Channel Setting Sensing Sensitivity: 2 mV
MDC IDC MSMT LEADCHNL RA IMPEDANCE VALUE: 418 Ohm
MDC IDC MSMT LEADCHNL RA PACING THRESHOLD AMPLITUDE: 0.5 V
MDC IDC MSMT LEADCHNL RA PACING THRESHOLD PULSEWIDTH: 0.4 ms
MDC IDC MSMT LEADCHNL RV IMPEDANCE VALUE: 456 Ohm
MDC IDC MSMT LEADCHNL RV PACING THRESHOLD AMPLITUDE: 0.5 V
MDC IDC SET LEADCHNL RA PACING AMPLITUDE: 1.5 V
MDC IDC SET ZONE DETECTION INTERVAL: 350 ms
MDC IDC SET ZONE DETECTION INTERVAL: 400 ms
MDC IDC STAT BRADY AP VS PERCENT: 0 %
MDC IDC STAT BRADY RA PERCENT PACED: 0.22 %

## 2015-02-02 NOTE — Progress Notes (Signed)
Pacemaker remote check. Device function reviewed. Impedance, sensing, auto capture thresholds consistent with previous measurements. Histograms appropriate for patient and level of activity. All other diagnostic data reviewed and is appropriate and stable for patient. Real time/magnet EGM shows appropriate sensing and capture. No mode switch or ventricular high rate episodes. Estimated longevity 10 years. Plan to follow in 3 months remotely, to see in office annually.

## 2015-02-16 ENCOUNTER — Encounter: Payer: Self-pay | Admitting: Cardiology

## 2015-02-25 ENCOUNTER — Encounter: Payer: Self-pay | Admitting: Internal Medicine

## 2015-03-18 ENCOUNTER — Other Ambulatory Visit (HOSPITAL_COMMUNITY): Payer: Self-pay | Admitting: Internal Medicine

## 2015-03-19 ENCOUNTER — Ambulatory Visit: Payer: Medicare Other | Admitting: Cardiology

## 2015-03-25 ENCOUNTER — Ambulatory Visit (INDEPENDENT_AMBULATORY_CARE_PROVIDER_SITE_OTHER): Payer: Medicare Other | Admitting: Adult Health

## 2015-03-25 ENCOUNTER — Ambulatory Visit: Payer: Medicare Other | Admitting: Adult Health

## 2015-03-25 ENCOUNTER — Encounter: Payer: Self-pay | Admitting: Adult Health

## 2015-03-25 VITALS — BP 108/76 | HR 88 | Temp 98.1°F | Ht 65.0 in | Wt 239.0 lb

## 2015-03-25 DIAGNOSIS — I6523 Occlusion and stenosis of bilateral carotid arteries: Secondary | ICD-10-CM | POA: Diagnosis not present

## 2015-03-25 DIAGNOSIS — G4733 Obstructive sleep apnea (adult) (pediatric): Secondary | ICD-10-CM

## 2015-03-25 DIAGNOSIS — J449 Chronic obstructive pulmonary disease, unspecified: Secondary | ICD-10-CM

## 2015-03-25 NOTE — Assessment & Plan Note (Signed)
Compensated on present regimen   Plan  continue on current regimen follow up Dr. Elsworth Soho  In 6 months and As needed

## 2015-03-25 NOTE — Progress Notes (Signed)
   Subjective:    Patient ID: [name], male    DOB: 10-Apr-1941, 74 y.o.   MRN: [id number]  HPI  74 yo ex-heavy smoker for FU of COPD & OSA. He smoked 2-3 PPD prior to his admission in Jan but has quit since.  He had prolonged hospitalization in 07/2012 with H1N1 influenza and pneumococcal PNA/ ARDS requiring tracheostomy. He also had a left empyema requiring chest tube, septic shock with elevated troponin, and AKI which resolved.   He had aortic stenosis - moderate to severe & severe 3 vessel disease on cath. He then had CABG-AVR (bioprosthetic valve). He was discharged on home O2.  Echo in 10/2012  showed EF up to 50%.     Admitted 12/2012 with hyperkalemia 6.5 and elevated creatinine 4.7 thought to be from contrast given with CTA abdomen/pelvis. Coreg, hydralazine, IMDUR, Torsemide were stopped. He was started on 160 mg of lasix twice a day. Discharge weight was 207 pounds  Significant tests/ events  10/23/12 PFTs reveal severe obstructive / restrictive disease & severely reduced DLCO.   PFT 01/21/13 >FEV1 58%, ratio 58, +25% change after BD , DLCO 38 .    1 yr FU Underwent AAA surgery - complicated by AKI - now cr 1.6 last 07/2014 CT abd 08/2014 reviewed Also required PPM  Did not require O2 x 3 yrs, completed rehab Denies any wheezing, no chest tx Patient wears CPAP every night for at least 6 hours. Says he feels rested the next day. Patient continues on budesonide nebulizer twice daily. He uses albuterol  nebulizer 3 times a day. He was given a trial of Breo in 2014 , but was unable to afford this inhaler.  03/25/2015 Follow up : COPD and OSA  Pt returns for 6 month follow up .  Remains on Pulmicort Twice daily  And Duoneb As needed  (takes most days 1-2 times) .  Says overall breathing is at baseline. No flare in cough or dyspnea.  Flu shot is utd.   Wears CPAP everynight , for 8hrs hours  No mask issues. Download requested . Marland Kitchen Feels rested.  No sign daytime  sleepiness.    Review of Systems neg for any significant sore throat, dysphagia, itching, sneezing, nasal congestion or excess/ purulent secretions, fever, chills, sweats, unintended wt loss, pleuritic or exertional cp, hempoptysis, orthopnea pnd or change in chronic leg swelling. Also denies presyncope, palpitations, heartburn, abdominal pain, nausea, vomiting, diarrhea or change in bowel or urinary habits, dysuria,hematuria, rash, arthralgias, visual complaints, headache, numbness weakness or ataxia.     Objective:   Physical Exam  Gen. Pleasant, obese, in no distress ENT - no lesions, no post nasal drip Neck: No JVD, no thyromegaly, no carotid bruits, old trach scar healed  Lungs: no use of accessory muscles, no dullness to percussion, decreased without rales or rhonchi  Cardiovascular: Rhythm regular, heart sounds  normal, no murmurs or gallops, tr  peripheral edema Musculoskeletal: No deformities, no cyanosis or clubbing , no tremors       Assessment & Plan:

## 2015-03-25 NOTE — Assessment & Plan Note (Signed)
Cont on nocturnal CPAP  Wt loss

## 2015-03-25 NOTE — Patient Instructions (Signed)
Continue on current regimen Continue on CPAP At bedtime   follow up Dr. Elsworth Soho  In 6 months and As needed

## 2015-03-28 ENCOUNTER — Other Ambulatory Visit: Payer: Self-pay | Admitting: Internal Medicine

## 2015-03-29 NOTE — Telephone Encounter (Signed)
Please advise on how patient should be taking this medication as the chart has multiple directions listed. Thanks, MI

## 2015-03-29 NOTE — Progress Notes (Signed)
Reviewed & agree with plan  

## 2015-03-30 ENCOUNTER — Ambulatory Visit (HOSPITAL_COMMUNITY)
Admission: RE | Admit: 2015-03-30 | Discharge: 2015-03-30 | Disposition: A | Discharge status: Home / Self Care | Payer: Medicare Other | Source: Ambulatory Visit | Attending: Internal Medicine | Admitting: Internal Medicine

## 2015-03-30 VITALS — BP 110/72 | HR 90 | Wt 239.0 lb

## 2015-03-30 DIAGNOSIS — Z953 Presence of xenogenic heart valve: Secondary | ICD-10-CM | POA: Diagnosis not present

## 2015-03-30 DIAGNOSIS — Z951 Presence of aortocoronary bypass graft: Secondary | ICD-10-CM

## 2015-03-30 DIAGNOSIS — G4733 Obstructive sleep apnea (adult) (pediatric): Secondary | ICD-10-CM | POA: Insufficient documentation

## 2015-03-30 DIAGNOSIS — N189 Chronic kidney disease, unspecified: Secondary | ICD-10-CM | POA: Diagnosis not present

## 2015-03-30 DIAGNOSIS — E1122 Type 2 diabetes mellitus with diabetic chronic kidney disease: Secondary | ICD-10-CM | POA: Insufficient documentation

## 2015-03-30 DIAGNOSIS — I2510 Atherosclerotic heart disease of native coronary artery without angina pectoris: Secondary | ICD-10-CM | POA: Diagnosis not present

## 2015-03-30 DIAGNOSIS — Z7982 Long term (current) use of aspirin: Secondary | ICD-10-CM | POA: Insufficient documentation

## 2015-03-30 DIAGNOSIS — Z79899 Other long term (current) drug therapy: Secondary | ICD-10-CM | POA: Diagnosis not present

## 2015-03-30 DIAGNOSIS — Z87891 Personal history of nicotine dependence: Secondary | ICD-10-CM | POA: Insufficient documentation

## 2015-03-30 DIAGNOSIS — I441 Atrioventricular block, second degree: Secondary | ICD-10-CM | POA: Insufficient documentation

## 2015-03-30 DIAGNOSIS — I5022 Chronic systolic (congestive) heart failure: Secondary | ICD-10-CM | POA: Diagnosis not present

## 2015-03-30 DIAGNOSIS — Z91041 Radiographic dye allergy status: Secondary | ICD-10-CM | POA: Diagnosis not present

## 2015-03-30 DIAGNOSIS — E785 Hyperlipidemia, unspecified: Secondary | ICD-10-CM | POA: Diagnosis not present

## 2015-03-30 DIAGNOSIS — I5032 Chronic diastolic (congestive) heart failure: Secondary | ICD-10-CM | POA: Diagnosis not present

## 2015-03-30 DIAGNOSIS — I129 Hypertensive chronic kidney disease with stage 1 through stage 4 chronic kidney disease, or unspecified chronic kidney disease: Secondary | ICD-10-CM | POA: Diagnosis not present

## 2015-03-30 DIAGNOSIS — Z954 Presence of other heart-valve replacement: Secondary | ICD-10-CM | POA: Diagnosis not present

## 2015-03-30 DIAGNOSIS — Z8546 Personal history of malignant neoplasm of prostate: Secondary | ICD-10-CM | POA: Insufficient documentation

## 2015-03-30 LAB — BASIC METABOLIC PANEL
Anion gap: 8 (ref 5–15)
BUN: 32 mg/dL — AB (ref 6–20)
CHLORIDE: 104 mmol/L (ref 101–111)
CO2: 26 mmol/L (ref 22–32)
Calcium: 9.3 mg/dL (ref 8.9–10.3)
Creatinine, Ser: 1.68 mg/dL — ABNORMAL HIGH (ref 0.61–1.24)
GFR calc Af Amer: 45 mL/min — ABNORMAL LOW (ref >60)
GFR calc non Af Amer: 39 mL/min — ABNORMAL LOW (ref >60)
Glucose, Bld: 206 mg/dL — ABNORMAL HIGH (ref 65–99)
Potassium: 3.9 mmol/L (ref 3.5–5.1)
SODIUM: 138 mmol/L (ref 135–145)

## 2015-03-30 LAB — LIPID PANEL
Cholesterol: 126 mg/dL (ref 0–200)
HDL: 21 mg/dL — ABNORMAL LOW (ref >40)
LDL Cholesterol: 49 mg/dL (ref 0–99)
Total CHOL/HDL Ratio: 6 RATIO
Triglycerides: 282 mg/dL — ABNORMAL HIGH (ref <150)
VLDL: 56 mg/dL — ABNORMAL HIGH (ref 0–40)

## 2015-03-30 LAB — BRAIN NATRIURETIC PEPTIDE: B Natriuretic Peptide: 66.1 pg/mL (ref 0.0–100.0)

## 2015-03-30 NOTE — Progress Notes (Signed)
Advanced Heart Failure Note   Patient ID: [name], male   DOB: Nov 25, 1940, 74 y.o.   MRN: [id number] PCP: Dr. [name] Nephrologist: Dr Deterding Vascular: Dr Kellie Simmering HF: [name]  74 yo with history of ARDS/respiratory failure and tracheostomy in 07/2012 as well as DM, HTN, and COPD presented to the ER at [place] with CHF in 10/2012. Patient had a prolonged hospitalization in 07/2012 with H1N1 influenza and pneumococcal PNA. This progressed to ARDS. He was intubated and ended up with tracheostomy. He had the trach removed. He also had a left empyema requiring chest tube, septic shock with elevated troponin, and AKI which resolved.  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   He was admitted again in 10/2012 from his nursing home with exertional dyspnea and orthopnea. He had gained 21 lbs. Echo at admission showed EF 35% with global hypokinesis that looked worse in the anteroseptal wall. He also was noted to have aortic stenosis rated as moderate to severe. He was diuresed and cathed, showing severe 3 vessel disease. He then had CABG-AVR (bioprosthetic valve).   He had a large AAA and underwent surgical repair in 07/2013.  He had AKI and Pseudomonas PNA post-operatively.  He was discharged to a nursing home. Echo in 1/15 showed EF 55-60% with mildly dilated and dysfunctional RV and a bioprosthetic aortic valve with mean gradient 32 mmHg (higher than expected).   Repeat limited echo in 08/2013 showed shower aortic valve mean gradient of 21 mmHg.   In 11/15, patient had a holter monitor placed for bradycardia.  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  Baseline bifascicular block.  His HR also was noted to drop into the 40s with exercise, suggesting exercise-associated heart block.  He underwent Medtronic-PCM placement in 1/16 by Dr Caryl Comes.   He returns today for regular HF follow up. Overall feels ok.  Anxious today as grand-daughter is in ER with abdominal pain. Has gained  weight since after having his pacemaker placed and became less active. He is up 11 lbs since his last visit (although that was in February.)  Is less active, but uses a stationary bike 2-3 times a week 35-40 minutes without SOB.  No lightheadedness or syncope.  Uses CPAP.  No chest pain.  +Bendopnea.  Dyspnea with heavy lifting.  Hip pain limits his ambulation.   Labs (2/15): K 4, creatinine 1.6, BNP 166 Labs (3/15): Hgb 11.6, pro-BNP 1037, creatinine 1.76, BUN 30, LDL 75, HDL 26 Labs (8/15): K 3.9, creatinine 1.55, HCT 40.6, LDL 71, HDL 27 Labs (1/16): K 4.3, Creatinine 1.6, HCT 42.9  ROS: All systems negative except as listed in HPI, PMH and Problem List.  PMH: 1. PNA (H1N1 influenza + pneumococcus) in [id number] complicated by respiratory failure/ARDS. Required tracheostomy, now weaned off. Had left empyema requiring chest tube.  Has developed post-infectious pulmonary fibrosis.  2. Prostate CA s/p radiation treatment. Has indwelling foley.  3. OSA: using CPAP 4. HTN  5. Hyperlipidemia  6. COPD: History of heavy smoking. PFTs (4/14) with mixed obstructive (COPD) and restrictive (post-ARDS) picture.  7. Type II diabetes  8. Anemia of chronic disease.  9. Ischemic cardiomyopathy: Echo (1/14) with severely dilated LV, EF 30-35%, diffuse hypokinesis worse in the anteroseptal wall, grade II diastolic dysfunction, mild MR, AS interpreted as "moderate to severe" with aortic valve mean gradient 23 mmHg. Echo (4/14) witih EF 35-40%, mid to apical anteroseptal akinesis, moderate to severe AS with mean gradient 33 mmHg, AVA 0.91 cm^2. Echo (  5/14) post CABG showed EF 50%, anteroseptal hypokinesis, bioprosthetic aortic valve well-seated. Echo (1/15) with EF 55-60%, mild LVH, mildly dilated RV with mildly decreased systolic function, D-shaped interventricular septum, bioprosthetic aortic valve with mean gardient 32 mmHg.  Echo (3/15) with EF 55-60%, mild LVH, bioprosthetic aortic valve with mean gradient 21 mmHg  (lower), no AI, RV moderately dilated with mild to moderately decreased systolic function.  10. Aortic stenosis: Moderate to severe by echo in 4/14. Bioprosthetic #21 [facility] Ease aortic valve replacement in 4/14.  11. CAD: LHC (4/14) with 95% pLAD, 70-80% ostial ramus, 70% mLCx, 90% pRCA, EF 35%. CABG 4/14 with LIMA-LAD, seq SVG-ramus and OM1, SVG-PDA.  12. Carotid stenosis: Carotid dopplers (0000000) with [id number] LICA stenosis.  Carotids (123456) with 123456 LICA stenosis.  13. AAA: 5/14 CT showed > 6 cm AAA, also right iliac aneurysm. Not stent graft candidate.  7/14 CT showed 7 cm AAA.  Surgical repair 1/15.  14. CKD: AKI in 7/14 from contrast nephropathy.  AKI in 1/15 after AAA repair.  15. Contrast allergy.  16. ABIs 9/14 were normal.  17. Gout 18. 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  HR drops with exercise (likely exercise-induced heart block).  Medtronic dual chamber PPM placed in 2016.   SH: Quit smoking in 1/14, married, no ETOH, lives in [place].   FH: No premature CAD  ROS: All systems reviewed and negative except as per HPI.    Current Outpatient Prescriptions  Medication Sig Dispense Refill  . allopurinol (ZYLOPRIM) 100 MG tablet Take 100 mg by mouth daily. At nighttime to equal 400 daily    . allopurinol (ZYLOPRIM) 300 MG tablet Take 300 mg by mouth daily.    Marland Kitchen aspirin EC 81 MG tablet Take 81 mg by mouth daily.    Marland Kitchen atorvastatin (LIPITOR) 80 MG tablet TAKE 1 TABLET (80 MG TOTAL) BY MOUTH DAILY BEFORE BREAKFAST. 30 tablet 6  . bisoprolol (ZEBETA) 5 MG tablet TAKE 0.5 TABLETS (2.5 MG TOTAL) BY MOUTH AT BEDTIME. 15 tablet 3  . budesonide (PULMICORT) 0.25 MG/2ML nebulizer solution INHALE 1 VIAL VIA NEBULIZER TWICE A DAY (EVERY 12 HOURS) 120 mL 11  . calcitRIOL (ROCALTROL) 0.25 MCG capsule Take 0.25 mcg by mouth daily.     . fexofenadine (ALLEGRA) 180 MG tablet Take 1/2 tablet by mouth daily    . furosemide (LASIX) 80 MG tablet TAKE 1 TABLET BY  MOUTH TWICE A DAY 60 tablet 6  . ipratropium-albuterol (DUONEB) 0.5-2.5 (3) MG/3ML SOLN Take 3 mLs by nebulization every 4 (four) hours as needed (SOB).    Marland Kitchen JANUVIA 100 MG tablet Take 100 mg by mouth daily.  6  . pantoprazole (PROTONIX) 40 MG tablet Take 40 mg by mouth at bedtime.     . potassium chloride (K-DUR,KLOR-CON) 10 MEQ tablet Take 20 mEq by mouth 2 (two) times daily. TAKE 2 TABLETS (20 meq) BY MOUTH TWICE A DAY, FOR A TOTAL OF 40 meq DAILY.    . tamsulosin (FLOMAX) 0.4 MG CAPS capsule Take 0.4 mg by mouth at bedtime.     Marland Kitchen NITROSTAT 0.4 MG SL tablet PLACE 1 TABLET UNDER TONGUE EVERY 5 MINUTES AS NEEDED FOR CHEST PAIN (Patient not taking: Reported on 03/30/2015) 25 tablet 0   No current facility-administered medications for this encounter.    Filed Vitals:   03/30/15 1045  BP: 110/72  Pulse: 90  Weight: 239 lb (108.41 kg)  SpO2: 96%   PHYSICAL EXAM: General:  NAD, overweight, wife present HEENT: normal Neck: Thick, JVP 7 cm. Carotids 2+ bilaterally; no bruits. No lymphadenopathy or thryomegaly. Cor: PMI normal. Regular rate & rhythm. No rubs, gallops. 1/6 SEM RUSB.  Lungs: slight crackles Abdomen: Obese,,nontender, nondistended. No hepatosplenomegaly. No bruits or masses. +BS Extremities: no cyanosis, clubbing, rash.  1+ ankle edema. Neuro: alert & orientedx3, cranial nerves grossly intact. Moves all 4 extremities w/o difficulty. Affect pleasant.  ASSESSMENT & PLAN: 1. CAD: Status post CABG for 3VD. No chest pain.  - Continue ASA, statin, and bisoprolol.  2. Chronic diastolic CHF: Last EF improved to 55-60% (08/2013). Stable NYHA class II symptoms and volume status stable. I think weight gain is more due to poor diet and lack of exercise than fluid overload.   - Continue lasix 80 mg BID.  - Check BMET and BNP today. - Encouraged to limit fluid intake and work on his diet with more vegetables and chicken and fish as opposed to high fat meats. 3. Pulmonary: Mixed restrictive  (post-ARDS)/obstructive picture on CT. Suspect that intrinsic lung disease (post-infectious pulmonary fibrosis) is a contributor to his dyspnea and hypoxemia. He is actually now off oxygen (just using CPAP at night).  4. Carotid stenosis: Followed at [facility]. 5. Hyperlipidemia: Continue statin. Lipids today. 6. AAA: s/p surgical repair. Stable on CT 08/11/14.  7. Status post bioprosthetic AVR: Gradient across the aortic valve was elevated in 1/15.  However, this was around the time of his AAA surgery and may be reflective of high flow.  Repeat echo in 3/15 showed lower gradient, 21 mmHg. Overdue for repeat Echo will arrange. He will need antibiotic prophylaxis with dental work.  8. CKD: Check BMET today.   9. 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  S/p Medtronic PCM 1/16  BMET, BNP, and Lipids today.  Needs echo. 4 months.   Shirley Friar PA-C 03/30/2015   Patient seen with PA, agree with the above note.  Weight is up, but I think this is more due to poor diet and lack of exercise than fluid overload.  Continue current Lasix.  I will arrange for repeat echo to follow bioprosthetic aortic valve, had high gradient across it on a prior echo but most recent echo showed improvement in gradient.    Loralie Champagne 03/30/2015

## 2015-03-30 NOTE — Progress Notes (Signed)
Advanced Heart Failure Medication Review by a Pharmacist  Does the patient  feel that his/her medications are working for him/her?  yes  Has the patient been experiencing any side effects to the medications prescribed?  no  Does the patient measure his/her own blood pressure or blood glucose at home?  yes   Does the patient have any problems obtaining medications due to transportation or finances?   no  Understanding of regimen: good Understanding of indications: good Potential of compliance: good    Pharmacist comments:  [name] is a pleasant 74 yo M presenting without a medication list but able to verbalize most of his medications including dosages to me. He reports excellent compliance and we reviewed the indications for his HF medications. He did not have any specific medication-related questions or concerns for me at this time.   Ruta Hinds. Velva Harman, PharmD, BCPS, CPP Clinical Pharmacist Pager: [phone] Phone: [phone] 03/30/2015 10:58 AM

## 2015-03-30 NOTE — Patient Instructions (Signed)
Labs today will call if abnormal  Echo of heart within 4 months   Follow up in 4 months   Echocardiogram An echocardiogram, or echocardiography, uses sound waves (ultrasound) to produce an image of your heart. The echocardiogram is simple, painless, obtained within a short period of time, and offers valuable information to your health care provider. The images from an echocardiogram can provide information such as:  Evidence of coronary artery disease (CAD).  Heart size.  Heart muscle function.  Heart valve function.  Aneurysm detection.  Evidence of a past heart attack.  Fluid buildup around the heart.  Heart muscle thickening.  Assess heart valve function. LET [facility] CARE PROVIDER KNOW ABOUT:  Any allergies you have.  All medicines you are taking, including vitamins, herbs, eye drops, creams, and over-the-counter medicines.  Previous problems you or members of your family have had with the use of anesthetics.  Any blood disorders you have.  Previous surgeries you have had.  Medical conditions you have.  Possibility of pregnancy, if this applies. BEFORE THE PROCEDURE  No special preparation is needed. Eat and drink normally.  PROCEDURE   In order to produce an image of your heart, gel will be applied to your chest and a wand-like tool (transducer) will be moved over your chest. The gel will help transmit the sound waves from the transducer. The sound waves will harmlessly bounce off your heart to allow the heart images to be captured in real-time motion. These images will then be recorded.  You may need an IV to receive a medicine that improves the quality of the pictures. AFTER THE PROCEDURE You may return to your normal schedule including diet, activities, and medicines, unless your health care provider tells you otherwise. Document Released: 06/16/2000 Document Revised: 11/03/2013 Document Reviewed: 02/24/2013 [facility] Patient Information 2015 [place],  [place]. This information is not intended to replace advice given to you by your health care provider. Make sure you discuss any questions you have with your health care provider.

## 2015-03-31 MED ORDER — POTASSIUM CHLORIDE CRYS ER 10 MEQ PO TBCR: EXTENDED_RELEASE_TABLET | ORAL | Status: DC

## 2015-03-31 NOTE — Telephone Encounter (Signed)
I called the patient and verified he is taking potassium 10 meq two tablets (20 meq) by mouth twice daily. Inquired if the patient was getting # 30 / # 90 days supply. He states he was getting 240 tablets. 30 day supply would be- 120 tablets 90 day supply would be - 360 tablets Will send in for #90 days worth.

## 2015-04-01 ENCOUNTER — Other Ambulatory Visit: Payer: Self-pay | Admitting: Cardiology

## 2015-04-09 ENCOUNTER — Other Ambulatory Visit (HOSPITAL_COMMUNITY): Payer: Self-pay | Admitting: Internal Medicine

## 2015-04-13 ENCOUNTER — Ambulatory Visit (HOSPITAL_COMMUNITY): Payer: Medicare Other | Attending: Cardiology

## 2015-04-13 ENCOUNTER — Other Ambulatory Visit: Payer: Self-pay

## 2015-04-13 DIAGNOSIS — Z952 Presence of prosthetic heart valve: Secondary | ICD-10-CM | POA: Insufficient documentation

## 2015-04-13 DIAGNOSIS — I509 Heart failure, unspecified: Secondary | ICD-10-CM | POA: Diagnosis not present

## 2015-04-13 DIAGNOSIS — I255 Ischemic cardiomyopathy: Secondary | ICD-10-CM | POA: Insufficient documentation

## 2015-04-13 DIAGNOSIS — I517 Cardiomegaly: Secondary | ICD-10-CM | POA: Insufficient documentation

## 2015-04-13 DIAGNOSIS — N189 Chronic kidney disease, unspecified: Secondary | ICD-10-CM | POA: Insufficient documentation

## 2015-04-13 DIAGNOSIS — I5022 Chronic systolic (congestive) heart failure: Secondary | ICD-10-CM | POA: Diagnosis not present

## 2015-04-13 DIAGNOSIS — E785 Hyperlipidemia, unspecified: Secondary | ICD-10-CM | POA: Diagnosis not present

## 2015-04-13 DIAGNOSIS — G4733 Obstructive sleep apnea (adult) (pediatric): Secondary | ICD-10-CM | POA: Insufficient documentation

## 2015-04-13 DIAGNOSIS — I129 Hypertensive chronic kidney disease with stage 1 through stage 4 chronic kidney disease, or unspecified chronic kidney disease: Secondary | ICD-10-CM | POA: Insufficient documentation

## 2015-04-13 DIAGNOSIS — E119 Type 2 diabetes mellitus without complications: Secondary | ICD-10-CM | POA: Insufficient documentation

## 2015-04-13 DIAGNOSIS — Z87891 Personal history of nicotine dependence: Secondary | ICD-10-CM | POA: Diagnosis not present

## 2015-04-13 DIAGNOSIS — I714 Abdominal aortic aneurysm, without rupture: Secondary | ICD-10-CM | POA: Insufficient documentation

## 2015-04-20 ENCOUNTER — Encounter: Payer: Self-pay | Admitting: Cardiology

## 2015-04-21 ENCOUNTER — Encounter: Payer: Self-pay | Admitting: Cardiology

## 2015-04-23 ENCOUNTER — Encounter (HOSPITAL_COMMUNITY): Payer: Self-pay | Admitting: *Deleted

## 2015-04-26 ENCOUNTER — Telehealth (HOSPITAL_COMMUNITY): Payer: Self-pay | Admitting: *Deleted

## 2015-04-26 ENCOUNTER — Telehealth (HOSPITAL_COMMUNITY): Payer: Self-pay

## 2015-04-26 DIAGNOSIS — I5020 Unspecified systolic (congestive) heart failure: Secondary | ICD-10-CM

## 2015-04-26 NOTE — Telephone Encounter (Signed)
Left message on voicemail per DPR in reference to upcoming appointment scheduled on 04/28/15 at [place] with detailed instructions given per Myocardial Perfusion Study Information Sheet for the test. LM to arrive 15 minutes early, and that it is imperative to arrive on time for appointment to keep from having the test rescheduled. If you need to cancel or reschedule your appointment, please call the office within 24 hours of your appointment. Failure to do so may result in a cancellation of your appointment, and a $50 no show fee. Phone number given for call back for any questions. Znya Albino, Ranae Palms

## 2015-04-26 NOTE — Telephone Encounter (Signed)
Patient returned call and I gave him the results of his echo.  Explained because of the results of his echo that Dr. [name] would like to schedule a Lexiscan with cardiolyte  Patient has been scheduled by Clarene Critchley at [facility] for Wednesday, October 26th at 0745am .  Patient made aware and understands directions to get to his appointment.

## 2015-04-28 ENCOUNTER — Ambulatory Visit (HOSPITAL_COMMUNITY): Payer: Medicare Other | Attending: Cardiology

## 2015-04-28 DIAGNOSIS — R9439 Abnormal result of other cardiovascular function study: Secondary | ICD-10-CM | POA: Insufficient documentation

## 2015-04-28 DIAGNOSIS — R0609 Other forms of dyspnea: Secondary | ICD-10-CM | POA: Diagnosis not present

## 2015-04-28 DIAGNOSIS — I779 Disorder of arteries and arterioles, unspecified: Secondary | ICD-10-CM | POA: Diagnosis not present

## 2015-04-28 DIAGNOSIS — I5020 Unspecified systolic (congestive) heart failure: Secondary | ICD-10-CM | POA: Diagnosis present

## 2015-04-28 LAB — MYOCARDIAL PERFUSION IMAGING
CHL CUP RESTING HR STRESS: 76 {beats}/min
LV dias vol: 124 mL
LVSYSVOL: 72 mL
NUC STRESS TID: 1.13
Peak HR: 80 {beats}/min
RATE: 0.28
SDS: 0
SRS: 6
SSS: 6

## 2015-04-28 MED ORDER — TECHNETIUM TC 99M SESTAMIBI GENERIC - CARDIOLITE
32.3000 | Freq: Once | INTRAVENOUS | Status: AC | PRN
  Administered 2015-04-28: 32.3 via INTRAVENOUS

## 2015-04-28 MED ORDER — TECHNETIUM TC 99M SESTAMIBI GENERIC - CARDIOLITE
10.9000 | Freq: Once | INTRAVENOUS | Status: AC | PRN
  Administered 2015-04-28: 10.9 via INTRAVENOUS

## 2015-04-28 MED ORDER — REGADENOSON 0.4 MG/5ML IV SOLN
0.4000 mg | Freq: Once | INTRAVENOUS | Status: AC
  Administered 2015-04-28: 0.4 mg via INTRAVENOUS

## 2015-05-04 ENCOUNTER — Ambulatory Visit (INDEPENDENT_AMBULATORY_CARE_PROVIDER_SITE_OTHER): Payer: Medicare Other | Admitting: *Deleted

## 2015-05-04 DIAGNOSIS — I442 Atrioventricular block, complete: Secondary | ICD-10-CM | POA: Diagnosis not present

## 2015-05-05 NOTE — Progress Notes (Signed)
Remote pacemaker transmission.   

## 2015-05-13 LAB — CUP PACEART REMOTE DEVICE CHECK
Battery Remaining Longevity: 112 mo
Battery Voltage: 3.02 V
Brady Statistic AP VP Percent: 0.13 %
Brady Statistic AP VS Percent: 0 %
Brady Statistic AS VS Percent: 0.11 %
Brady Statistic RV Percent Paced: 99.89 %
Implantable Lead Implant Date: 20160113
Implantable Lead Model: 5076
Lead Channel Impedance Value: 437 Ohm
Lead Channel Impedance Value: 437 Ohm
Lead Channel Impedance Value: 513 Ohm
Lead Channel Pacing Threshold Amplitude: 0.5 V
Lead Channel Pacing Threshold Amplitude: 0.5 V
Lead Channel Sensing Intrinsic Amplitude: 2.5 mV
Lead Channel Sensing Intrinsic Amplitude: 2.5 mV
Lead Channel Setting Pacing Amplitude: 1.5 V
Lead Channel Setting Sensing Sensitivity: 2 mV
MDC IDC LEAD IMPLANT DT: 20160113
MDC IDC LEAD LOCATION: 753859
MDC IDC LEAD LOCATION: 753860
MDC IDC MSMT LEADCHNL RA IMPEDANCE VALUE: 361 Ohm
MDC IDC MSMT LEADCHNL RA PACING THRESHOLD PULSEWIDTH: 0.4 ms
MDC IDC MSMT LEADCHNL RV PACING THRESHOLD PULSEWIDTH: 0.4 ms
MDC IDC MSMT LEADCHNL RV SENSING INTR AMPL: 9.625 mV
MDC IDC MSMT LEADCHNL RV SENSING INTR AMPL: 9.625 mV
MDC IDC SESS DTM: 20161101130725
MDC IDC SET LEADCHNL RV PACING AMPLITUDE: 2 V
MDC IDC SET LEADCHNL RV PACING PULSEWIDTH: 0.4 ms
MDC IDC STAT BRADY AS VP PERCENT: 99.75 %
MDC IDC STAT BRADY RA PERCENT PACED: 0.13 %

## 2015-05-16 ENCOUNTER — Other Ambulatory Visit: Payer: Self-pay | Admitting: Pulmonary Disease

## 2015-05-24 ENCOUNTER — Telehealth: Payer: Self-pay | Admitting: Pulmonary Disease

## 2015-05-24 MED ORDER — IPRATROPIUM-ALBUTEROL 0.5-2.5 (3) MG/3ML IN SOLN: RESPIRATORY_TRACT | Status: DC

## 2015-05-24 NOTE — Telephone Encounter (Signed)
I have re faxed the rx over with diagnosis code for duoneb. Nothing further needed

## 2015-06-16 ENCOUNTER — Encounter: Payer: Self-pay | Admitting: Cardiology

## 2015-07-29 ENCOUNTER — Encounter: Payer: Self-pay | Admitting: Internal Medicine

## 2015-07-29 ENCOUNTER — Telehealth (HOSPITAL_COMMUNITY): Payer: Self-pay | Admitting: Vascular Surgery

## 2015-07-29 ENCOUNTER — Other Ambulatory Visit (HOSPITAL_COMMUNITY): Payer: Self-pay | Admitting: Internal Medicine

## 2015-07-29 ENCOUNTER — Ambulatory Visit (INDEPENDENT_AMBULATORY_CARE_PROVIDER_SITE_OTHER): Payer: Medicare Other | Admitting: Internal Medicine

## 2015-07-29 VITALS — BP 110/76 | HR 89 | Ht 65.0 in | Wt 233.9 lb

## 2015-07-29 DIAGNOSIS — Z950 Presence of cardiac pacemaker: Secondary | ICD-10-CM

## 2015-07-29 DIAGNOSIS — I442 Atrioventricular block, complete: Secondary | ICD-10-CM

## 2015-07-29 DIAGNOSIS — R000 Tachycardia, unspecified: Secondary | ICD-10-CM

## 2015-07-29 DIAGNOSIS — I519 Heart disease, unspecified: Secondary | ICD-10-CM

## 2015-07-29 DIAGNOSIS — I5042 Chronic combined systolic (congestive) and diastolic (congestive) heart failure: Secondary | ICD-10-CM

## 2015-07-29 LAB — CUP PACEART INCLINIC DEVICE CHECK
Battery Remaining Longevity: 110 mo
Brady Statistic AP VP Percent: 0.16 %
Brady Statistic AP VS Percent: 0 %
Brady Statistic AS VP Percent: 99.66 %
Brady Statistic AS VS Percent: 0.19 %
Implantable Lead Implant Date: 20160113
Implantable Lead Location: 753859
Implantable Lead Location: 753860
Lead Channel Impedance Value: 475 Ohm
Lead Channel Impedance Value: 494 Ohm
Lead Channel Pacing Threshold Amplitude: 0.5 V
Lead Channel Pacing Threshold Pulse Width: 0.4 ms
Lead Channel Sensing Intrinsic Amplitude: 2.375 mV
Lead Channel Sensing Intrinsic Amplitude: 2.5 mV
Lead Channel Sensing Intrinsic Amplitude: 2.75 mV
Lead Channel Setting Pacing Amplitude: 1.5 V
Lead Channel Setting Pacing Amplitude: 2 V
MDC IDC LEAD IMPLANT DT: 20160113
MDC IDC MSMT BATTERY VOLTAGE: 3.02 V
MDC IDC MSMT LEADCHNL RA IMPEDANCE VALUE: 380 Ohm
MDC IDC MSMT LEADCHNL RA PACING THRESHOLD AMPLITUDE: 0.625 V
MDC IDC MSMT LEADCHNL RA SENSING INTR AMPL: 2.625 mV
MDC IDC MSMT LEADCHNL RV IMPEDANCE VALUE: 532 Ohm
MDC IDC MSMT LEADCHNL RV PACING THRESHOLD PULSEWIDTH: 0.4 ms
MDC IDC SESS DTM: 20170126164541
MDC IDC SET LEADCHNL RV PACING PULSEWIDTH: 0.4 ms
MDC IDC SET LEADCHNL RV SENSING SENSITIVITY: 0.9 mV
MDC IDC STAT BRADY RA PERCENT PACED: 0.16 %
MDC IDC STAT BRADY RV PERCENT PACED: 99.81 %

## 2015-07-29 MED ORDER — BISOPROLOL FUMARATE 5 MG PO TABS: 2.5000 mg | ORAL_TABLET | Freq: Two times a day (BID) | ORAL | Status: DC

## 2015-07-29 MED ORDER — TORSEMIDE 20 MG PO TABS: ORAL_TABLET | ORAL | Status: DC

## 2015-07-29 NOTE — Telephone Encounter (Signed)
Left pt message to make f/u appt w/ [facility] 

## 2015-07-29 NOTE — Patient Instructions (Addendum)
Medication Instructions: 1.) INCREASE Bisoprolol 5mg  take one-half tablet (2.5mg ) twice daily. 2.) Stop furosemide 3) Start Torsemide 20mg  two tablets (40 mg) by mouth twice daily.     Labwork: Your physician recommends that you return for lab work in 2 weeks (BMP, TSH)   Procedures/Testing: Your physician has requested that you have an echocardiogram in 2 weeks (same day as labs). Echocardiography is a painless test that uses sound waves to create images of your heart. It provides your doctor with information about the size and shape of your heart and how well your heart's chambers and valves are working. This procedure takes approximately one hour. There are no restrictions for this procedure.  Your physician has recommended that you have a sleep study-[facility] will call to schedule. This test records several body functions during sleep, including: brain activity, eye movement, oxygen and carbon dioxide blood levels, heart rate and rhythm, breathing rate and rhythm, the flow of air through your mouth and nose, snoring, body muscle movements, and chest and belly movement.     Follow-Up: Remote monitoring is used to monitor your Pacemaker of ICD from home. This monitoring reduces the number of office visits required to check your device to one time per year. It allows [place] to keep an eye on the functioning of your device to ensure it is working properly. You are scheduled for a device check from home on 10/28/2015. You may send your transmission at any time that day. If you have a wireless device, the transmission will be sent automatically. After your physician reviews your transmission, you will receive a postcard with your next transmission date.  Your physician wants you to follow-up in 1 year with Dr. Caryl Comes. You will receive a reminder letter in the mail two months in advance. If you don't receive a letter, please call our office to schedule the follow-up appointment.    Any Additional  Special Instructions Will Be Listed Below (If Applicable).     If you need a refill on your cardiac medications before your next appointment, please call your pharmacy.

## 2015-07-29 NOTE — Progress Notes (Signed)
Patient Care Team: [name], MD as PCP - General (Internal Medicine) Larey Dresser, MD as Attending Physician (Cardiology)   HPI  [name] is a 75 y.o. 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 He underwent device implantation January 2016.  Cardiac evaluation has included an echocardiogram 1/15 demonstrated near-normal LV function.  Interval echocardiogram 10/26 demonstrated LV dysfunction with an EF of 30-35%. Myoview confirmed this.  His tachycardia remains an issue.  Dyspnea on exertion is worsening. He has some peripheral edema.  He has sleep apnea which has been treated with based on a sleep study almost a decade ago. His machine has never been read.    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    Past Medical History  Diagnosis Date  . Hypertension   . Hyperlipidemia   . Prediabetes   . CHF (congestive heart failure) ([place]) 07/2012; 10/17/2012  . COPD (chronic obstructive pulmonary disease) ([place])     a.  History of heavy smoking. PFTs (4/14) with mixed obstructive (COPD) and restrictive (post-ARDS) picture.   . Pneumonia 07/2012    PNA (H1N1 influenza + pneumococcus) in [id number] complicated by respiratory failure/ARDS. Required tracheostomy, now weaned off. Had left empyema requiring chest tube.   . Diabetes mellitus without complication ([name])   . History of blood transfusion     "lots since January" (10/17/2012)  . Ischemic cardiomyopathy   . Anemia of chronic disease   . Aortic stenosis     Moderate to severe by echo in 4/14. Bioprosthetic #21 [facility] Ease aortic valve replacement in 4/14.   Marland Kitchen CAD (coronary artery disease)     a. 4/14 CABG: LIMA-LAD, seq SVG-ramus & OM1, SVG-PDA  . Carotid arterial disease (HCC)     Carotid dopplers (0000000) with A999333 LICA stenosis.   Marland Kitchen AAA (abdominal aortic aneurysm) ([place])     5/14 CT showed >  6 cm AAA, also right iliac aneurysm. Not stent graft candidate.   . Esophageal reflux   . Ischemic cardiomyopathy     a. 4/14 LHC: pLAD 95, ost ramus 70-80, mLCx 90, EF 30%  . Chronic systolic heart failure (HCC)     a. 1/14 ECHO: sev dil LV, EF30-35%, diff HK, mild MR, AS severe, AV grad 35 b. 8/14 ECHO: EF 55-60%, mild biopros AV sten mn grad. 25, RV mild dil, RA mild dil  . Complication of anesthesia     during last surgery had to be given special medicine b/c "something dropped" during surgery   . Anxiety     pt. admits that he has anxiety at times   . Shortness of breath     still goes deer hunting by himself  . OSA (obstructive sleep apnea)     on cpap- every sleep time.   . Chronic kidney disease     increased creatinine recently - being followed, near kidney failure fr. contrast dye   . Prostate cancer [facility])      s/p radiation treatment. - (PT. DENIES)Has indwelling foley.   . Arthritis     Past Surgical History  Procedure Laterality Date  . Transurethral microwave therapy  10/15/2012  . Nasal fracture surgery  1970's  . Tracheostomy  07/2012  . Tracheostomy closure  08/2012  . Anal fissure repair  2008  . Coronary artery bypass graft N/A 10/25/2012    Procedure: CORONARY ARTERY BYPASS GRAFTING (  CABG);  Surgeon: Melrose Nakayama, MD;  Location: [place];  Service: Open Heart Surgery;  Laterality: N/A;  times 4 using left internal mammary artery and endoscopically harvested bilateral saphenous vein   . Aortic valve replacement N/A 10/25/2012    Procedure: AORTIC VALVE REPLACEMENT (AVR);  Surgeon: Melrose Nakayama, MD;  Location: [place];  Service: Open Heart Surgery;  Laterality: N/A;  . Intraoperative transesophageal echocardiogram N/A 10/25/2012    Procedure: INTRAOPERATIVE TRANSESOPHAGEAL ECHOCARDIOGRAM;  Surgeon: Melrose Nakayama, MD;  Location: [place];  Service: Open Heart Surgery;  Laterality: N/A;  . Cardiac valve replacement    . Abdominal aortic aneurysm repair N/A  07/10/2013    Procedure: Resection and Graftiong of perirenal AAA; Insertion 14 x 8 Hemashield Graft Aorta to Left Common Iliac and to Right Common Femoral Artery With Ligastion of Right External and Interanl Iliac Artery;  Surgeon: Mal Misty, MD;  Location[facility] OR;  Service: Vascular;  Laterality: N/A;  . Left and right heart catheterization with coronary angiogram N/A 10/22/2012    Procedure: LEFT AND RIGHT HEART CATHETERIZATION WITH CORONARY ANGIOGRAM;  Surgeon: Larey Dresser, MD;  Location: [facility] CATH LAB;  Service: Cardiovascular;  Laterality: N/A;  . Permanent pacemaker insertion N/A 07/15/2014    Procedure: PERMANENT PACEMAKER INSERTION;  Surgeon: Deboraha Sprang, MD;  Location[facility] C[facility] LAB;  Service: Cardiovascular;  Laterality: N/A;    Current Outpatient Prescriptions  Medication Sig Dispense Refill  . allopurinol (ZYLOPRIM) 300 MG tablet Take 300 mg by mouth daily.    Marland Kitchen amoxicillin (AMOXIL) 500 MG capsule Take 500 mg by mouth as needed. As prescribed for dental work    . aspirin EC 81 MG tablet Take 81 mg by mouth daily.    Marland Kitchen atorvastatin (LIPITOR) 80 MG tablet TAKE 1 TABLET (80 MG TOTAL) BY MOUTH DAILY BEFORE BREAKFAST. 30 tablet 6  . bisoprolol (ZEBETA) 5 MG tablet TAKE 0.5 TABLETS (2.5 MG TOTAL) BY MOUTH AT BEDTIME. 15 tablet 3  . budesonide (PULMICORT) 0.25 MG/2ML nebulizer solution INHALE 1 VIAL VIA NEBULIZER TWICE A DAY (EVERY 12 HOURS) 120 mL 11  . calcitRIOL (ROCALTROL) 0.25 MCG capsule Take 0.25 mcg by mouth daily.     . fexofenadine (ALLEGRA) 180 MG tablet Take 1/2 tablet by mouth daily    . furosemide (LASIX) 80 MG tablet TAKE 1 TABLET BY MOUTH TWICE A DAY 60 tablet 6  . ipratropium-albuterol (DUONEB) 0.5-2.5 (3) MG/3ML SOLN TAKE 3 MLS BY NEBULIZATION EVERY 4 (FOUR) HOURS AS NEEDED. DX J44.9 360 mL 1  . JANUVIA 100 MG tablet Take 100 mg by mouth daily.  6  . NITROSTAT 0.4 MG SL tablet PLACE 1 TABLET UNDER TONGUE EVERY 5 MINUTES AS NEEDED FOR CHEST PAIN 25 tablet 0  .  pantoprazole (PROTONIX) 40 MG tablet Take 40 mg by mouth at bedtime.     . potassium chloride (K-DUR,KLOR-CON) 10 MEQ tablet Take two tablets (20 mEq) by mouth twice daily 360 tablet 3  . tamsulosin (FLOMAX) 0.4 MG CAPS capsule Take 0.4 mg by mouth at bedtime.      No current facility-administered medications for this visit.    Allergies  Allergen Reactions  . Omnipaque [Iohexol] Other (See Comments)    Decreased kidney function  . Primaxin [Imipenem] Hives  . Cephalosporins Rash    Blisters  . Clindamycin/Lincomycin Swelling and Rash    Review of Systems negative except from HPI and PMH  Physical Exam BP 110/76 mmHg  Pulse 89  Ht 5\' 5"  (1.651 m)  Wt 233 lb 14.4 oz (106.096 kg)  BMI 38.92 kg/m2  SpO2 92% Well developed and well nourished in no acute distress HENT normal E scleral and icterus clear Neck Supple JVP9; carotids brisk and full Clear to ausculation Device pocket well healed; without hematoma or erythema.  There is no tethering Regular rate and rhythm, no murmurs gallops or rub Soft with active bowel sounds No clubbing cyanosis tr Edema Alert and oriented, grossly normal motor and sensory function Skin Warm and Dry  ECG demonstrates P synchronous pacing  Assessment and  Plan  High-grade heart block now device dependent  Obesity  Obstructive sleep apnea  Cardiomyopathy question pacemaker  Congestive heart failure-chronic-mixed  Sinus tachycardia  Pacemaker-[employer]onic The patient's device was interrogated.  The information was reviewed. No changes were made in the programming.     Ischemic heart disease with prior bypass surgery  Without symptoms of ischemia  We have discussed exercise in the importance of doing it increasingly frequently.  We will change his furosemide--torsemide to see if we can't augment his diuresis. He is volume overloaded.  Sinus tachycardia goes back 2014. We will try and increase his bisoprolol 2.5--2.5 twice a day. With  his progressive cardiomyopathy renal disease, it would also be appropriate to consider ACE inhibitor although his blood pressure may be limiting.  His LV dysfunction occurs in the context of RV apical pacing. We will plan to undertake an echocardiogram to see if there is then further interval deterioration in LV systolic function and he'll see Dr. [name] with whom I have discussed this plan and will consider RV--CRT   pacing upgrade   He has been years since he had a sleep study. We will arrange for repeat sleep study. His CPAP has not been read in that same interval.

## 2015-07-30 ENCOUNTER — Other Ambulatory Visit (HOSPITAL_COMMUNITY): Payer: Self-pay | Admitting: Internal Medicine

## 2015-08-10 ENCOUNTER — Encounter: Payer: Self-pay | Admitting: Family

## 2015-08-10 ENCOUNTER — Encounter: Payer: Self-pay | Admitting: Cardiology

## 2015-08-12 ENCOUNTER — Ambulatory Visit (HOSPITAL_COMMUNITY): Payer: Medicare Other | Attending: Cardiology

## 2015-08-12 ENCOUNTER — Other Ambulatory Visit (INDEPENDENT_AMBULATORY_CARE_PROVIDER_SITE_OTHER): Payer: Medicare Other | Admitting: *Deleted

## 2015-08-12 ENCOUNTER — Other Ambulatory Visit: Payer: Self-pay | Admitting: *Deleted

## 2015-08-12 ENCOUNTER — Other Ambulatory Visit: Payer: Self-pay

## 2015-08-12 DIAGNOSIS — I071 Rheumatic tricuspid insufficiency: Secondary | ICD-10-CM | POA: Diagnosis not present

## 2015-08-12 DIAGNOSIS — I5042 Chronic combined systolic (congestive) and diastolic (congestive) heart failure: Secondary | ICD-10-CM | POA: Diagnosis present

## 2015-08-12 DIAGNOSIS — E119 Type 2 diabetes mellitus without complications: Secondary | ICD-10-CM | POA: Diagnosis not present

## 2015-08-12 DIAGNOSIS — E785 Hyperlipidemia, unspecified: Secondary | ICD-10-CM | POA: Insufficient documentation

## 2015-08-12 DIAGNOSIS — R000 Tachycardia, unspecified: Secondary | ICD-10-CM | POA: Diagnosis not present

## 2015-08-12 DIAGNOSIS — I442 Atrioventricular block, complete: Secondary | ICD-10-CM

## 2015-08-12 DIAGNOSIS — I350 Nonrheumatic aortic (valve) stenosis: Secondary | ICD-10-CM | POA: Insufficient documentation

## 2015-08-12 DIAGNOSIS — I517 Cardiomegaly: Secondary | ICD-10-CM | POA: Insufficient documentation

## 2015-08-12 DIAGNOSIS — I519 Heart disease, unspecified: Secondary | ICD-10-CM

## 2015-08-12 DIAGNOSIS — I340 Nonrheumatic mitral (valve) insufficiency: Secondary | ICD-10-CM | POA: Diagnosis not present

## 2015-08-12 DIAGNOSIS — G4733 Obstructive sleep apnea (adult) (pediatric): Secondary | ICD-10-CM

## 2015-08-12 LAB — BASIC METABOLIC PANEL
BUN: 37 mg/dL — ABNORMAL HIGH (ref 7–25)
CALCIUM: 8.8 mg/dL (ref 8.6–10.3)
CO2: 27 mmol/L (ref 20–31)
CREATININE: 1.64 mg/dL — AB (ref 0.70–1.18)
Chloride: 104 mmol/L (ref 98–110)
Glucose, Bld: 143 mg/dL — ABNORMAL HIGH (ref 65–99)
Potassium: 4 mmol/L (ref 3.5–5.3)
Sodium: 140 mmol/L (ref 135–146)

## 2015-08-12 LAB — TSH: TSH: 1.4 m[IU]/L (ref 0.40–4.50)

## 2015-08-17 ENCOUNTER — Ambulatory Visit (HOSPITAL_COMMUNITY)
Admission: RE | Admit: 2015-08-17 | Discharge: 2015-08-17 | Disposition: A | Discharge status: Home / Self Care | Payer: Medicare Other | Source: Ambulatory Visit | Attending: Vascular Surgery | Admitting: Vascular Surgery

## 2015-08-17 ENCOUNTER — Ambulatory Visit (INDEPENDENT_AMBULATORY_CARE_PROVIDER_SITE_OTHER): Payer: Medicare Other | Admitting: Family

## 2015-08-17 ENCOUNTER — Encounter: Payer: Self-pay | Admitting: Family

## 2015-08-17 VITALS — BP 120/81 | HR 88 | Temp 97.0°F | Resp 16 | Ht 66.5 in | Wt 231.0 lb

## 2015-08-17 DIAGNOSIS — E1122 Type 2 diabetes mellitus with diabetic chronic kidney disease: Secondary | ICD-10-CM | POA: Diagnosis not present

## 2015-08-17 DIAGNOSIS — Z4889 Encounter for other specified surgical aftercare: Secondary | ICD-10-CM

## 2015-08-17 DIAGNOSIS — I714 Abdominal aortic aneurysm, without rupture, unspecified: Secondary | ICD-10-CM

## 2015-08-17 DIAGNOSIS — E785 Hyperlipidemia, unspecified: Secondary | ICD-10-CM | POA: Diagnosis not present

## 2015-08-17 DIAGNOSIS — Z9889 Other specified postprocedural states: Secondary | ICD-10-CM

## 2015-08-17 DIAGNOSIS — I129 Hypertensive chronic kidney disease with stage 1 through stage 4 chronic kidney disease, or unspecified chronic kidney disease: Secondary | ICD-10-CM | POA: Insufficient documentation

## 2015-08-17 DIAGNOSIS — I6523 Occlusion and stenosis of bilateral carotid arteries: Secondary | ICD-10-CM | POA: Insufficient documentation

## 2015-08-17 DIAGNOSIS — Z48812 Encounter for surgical aftercare following surgery on the circulatory system: Secondary | ICD-10-CM

## 2015-08-17 DIAGNOSIS — N189 Chronic kidney disease, unspecified: Secondary | ICD-10-CM | POA: Diagnosis not present

## 2015-08-17 NOTE — Patient Instructions (Signed)
Stroke Prevention Some medical conditions and behaviors are associated with an increased chance of having a stroke. You may prevent a stroke by making healthy choices and managing medical conditions. HOW CAN I REDUCE MY RISK OF HAVING A STROKE?   Stay physically active. Get at least 30 minutes of activity on most or all days.  Do not smoke. It may also be helpful to avoid exposure to secondhand smoke.  Limit alcohol use. Moderate alcohol use is considered to be:  No more than 2 drinks per day for men.  No more than 1 drink per day for nonpregnant women.  Eat healthy foods. This involves:  Eating 5 or more servings of fruits and vegetables a day.  Making dietary changes that address high blood pressure (hypertension), high cholesterol, diabetes, or obesity.  Manage your cholesterol levels.  Making food choices that are high in fiber and low in saturated fat, trans fat, and cholesterol may control cholesterol levels.  Take any prescribed medicines to control cholesterol as directed by your health care provider.  Manage your diabetes.  Controlling your carbohydrate and sugar intake is recommended to manage diabetes.  Take any prescribed medicines to control diabetes as directed by your health care provider.  Control your hypertension.  Making food choices that are low in salt (sodium), saturated fat, trans fat, and cholesterol is recommended to manage hypertension.  Ask your health care provider if you need treatment to lower your blood pressure. Take any prescribed medicines to control hypertension as directed by your health care provider.  If you are 18-39 years of age, have your blood pressure checked every 3-5 years. If you are 40 years of age or older, have your blood pressure checked every year.  Maintain a healthy weight.  Reducing calorie intake and making food choices that are low in sodium, saturated fat, trans fat, and cholesterol are recommended to manage  weight.  Stop drug abuse.  Avoid taking birth control pills.  Talk to your health care provider about the risks of taking birth control pills if you are over 35 years old, smoke, get migraines, or have ever had a blood clot.  Get evaluated for sleep disorders (sleep apnea).  Talk to your health care provider about getting a sleep evaluation if you snore a lot or have excessive sleepiness.  Take medicines only as directed by your health care provider.  For some people, aspirin or blood thinners (anticoagulants) are helpful in reducing the risk of forming abnormal blood clots that can lead to stroke. If you have the irregular heart rhythm of atrial fibrillation, you should be on a blood thinner unless there is a good reason you cannot take them.  Understand all your medicine instructions.  Make sure that other conditions (such as anemia or atherosclerosis) are addressed. SEEK IMMEDIATE MEDICAL CARE IF:   You have sudden weakness or numbness of the face, arm, or leg, especially on one side of the body.  Your face or eyelid droops to one side.  You have sudden confusion.  You have trouble speaking (aphasia) or understanding.  You have sudden trouble seeing in one or both eyes.  You have sudden trouble walking.  You have dizziness.  You have a loss of balance or coordination.  You have a sudden, severe headache with no known cause.  You have new chest pain or an irregular heartbeat. Any of these symptoms may represent a serious problem that is an emergency. Do not wait to see if the symptoms will   go away. Get medical help at once. Call your local emergency services (911 in [place] Do not drive yourself to the hospital.   This information is not intended to replace advice given to you by your health care provider. Make sure you discuss any questions you have with your health care provider.   Document Released: 07/27/2004 Document Revised: 07/10/2014 Document Reviewed:  12/20/2012 Elsevier Interactive Patient Education 2016 Elsevier Inc.  

## 2015-08-17 NOTE — Progress Notes (Signed)
Chief Complaint: Extracranial Carotid Artery Stenosis   History of Present Illness  [name] is a 75 y.o. male patient of Dr. Kellie Simmering returns for continued follow-up regarding his mild carotid occlusive disease and previous abdominal aortic aneurysm resection. He denies any neurologic symptoms such as lateralizing weakness, aphasia, amaurosis fugax, diplopia, blurred vision, or syncope.  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 He is not on any anticoagulation medications other than aspirin daily   He rides a stationary bike several times/week.  The patient reports New Medical or Surgical History: states his left ventricle is having issues, states he may need a pacemaker adjustment  Pt Diabetic: yes, states his last A1C was 7.2; he states this was higher than his usual A1C in the 6.? Since he had just received a kenalog injection for a generalized rash Pt smoker: former smoker, quit in 2014  Pt meds include: Statin : yes ASA: yes Other anticoagulants/antiplatelets: no   Past Medical History  Diagnosis Date  . Hypertension   . Hyperlipidemia   . Prediabetes   . CHF (congestive heart failure) ([place]) 07/2012; 10/17/2012  . COPD (chronic obstructive pulmonary disease) ([place])     a.  History of heavy smoking. PFTs (4/14) with mixed obstructive (COPD) and restrictive (post-ARDS) picture.   . Pneumonia 07/2012    PNA (H1N1 influenza + pneumococcus) in [id number] complicated by respiratory failure/ARDS. Required tracheostomy, now weaned off. Had left empyema requiring chest tube.   . Diabetes mellitus without complication ([place])   . History of blood transfusion     "lots since January" (10/17/2012)  . Ischemic cardiomyopathy   . Anemia of chronic disease   . Aortic stenosis     Moderate to severe by echo in 4/14. Bioprosthetic #21 [facility] Ease aortic valve replacement in 4/14.   Marland Kitchen CAD (coronary artery disease)     a. 4/14 CABG: LIMA-LAD, seq SVG-ramus & OM1,  SVG-PDA  . Carotid arterial disease (HCC)     Carotid dopplers (0000000) with A999333 LICA stenosis.   Marland Kitchen AAA (abdominal aortic aneurysm) ([place])     5/14 CT showed > 6 cm AAA, also right iliac aneurysm. Not stent graft candidate.   . Esophageal reflux   . Ischemic cardiomyopathy     a. 4/14 LHC: pLAD 95, ost ramus 70-80, mLCx 90, EF 30%  . Chronic systolic heart failure (HCC)     a. 1/14 ECHO: sev dil LV, EF30-35%, diff HK, mild MR, AS severe, AV grad 35 b. 8/14 ECHO: EF 55-60%, mild biopros AV sten mn grad. 25, RV mild dil, RA mild dil  . Complication of anesthesia     during last surgery had to be given special medicine b/c "something dropped" during surgery   . Anxiety     pt. admits that he has anxiety at times   . Shortness of breath     still goes deer hunting by himself  . OSA (obstructive sleep apnea)     on cpap- every sleep time.   . Chronic kidney disease     increased creatinine recently - being followed, near kidney failure fr. contrast dye   . Prostate cancer [facility])      s/p radiation treatment. - (PT. DENIES)Has indwelling foley.   . Arthritis     Social History Social History  Substance Use Topics  . Smoking status: Former Smoker -- 2.00 packs/day for 58 years    Types: Cigarettes    Quit date: 07/20/2012  .  Smokeless tobacco: Never Used  . Alcohol Use: No     Comment: 10/17/2012 "years since I've had a drink; never had problem with it"    Family History Family History  Problem Relation Age of Onset  . Congestive Heart Failure    . Heart disease    . Cancer Mother   . Heart disease Father     Surgical History Past Surgical History  Procedure Laterality Date  . Transurethral microwave therapy  10/15/2012  . Nasal fracture surgery  1970's  . Tracheostomy  07/2012  . Tracheostomy closure  08/2012  . Anal fissure repair  2008  . Coronary artery bypass graft N/A 10/25/2012    Procedure: CORONARY ARTERY BYPASS GRAFTING (CABG);  Surgeon: Melrose Nakayama, MD;   Location: [place];  Service: Open Heart Surgery;  Laterality: N/A;  times 4 using left internal mammary artery and endoscopically harvested bilateral saphenous vein   . Aortic valve replacement N/A 10/25/2012    Procedure: AORTIC VALVE REPLACEMENT (AVR);  Surgeon: Melrose Nakayama, MD;  Location: [place];  Service: Open Heart Surgery;  Laterality: N/A;  . Intraoperative transesophageal echocardiogram N/A 10/25/2012    Procedure: INTRAOPERATIVE TRANSESOPHAGEAL ECHOCARDIOGRAM;  Surgeon: Melrose Nakayama, MD;  Location: [place];  Service: Open Heart Surgery;  Laterality: N/A;  . Cardiac valve replacement    . Abdominal aortic aneurysm repair N/A 07/10/2013    Procedure: Resection and Graftiong of perirenal AAA; Insertion 14 x 8 Hemashield Graft Aorta to Left Common Iliac and to Right Common Femoral Artery With Ligastion of Right External and Interanl Iliac Artery;  Surgeon: Mal Misty, MD;  Location: [facility] OR;  Service: Vascular;  Laterality: N/A;  . Left and right heart catheterization with coronary angiogram N/A 10/22/2012    Procedure: LEFT AND RIGHT HEART CATHETERIZATION WITH CORONARY ANGIOGRAM;  Surgeon: Larey Dresser, MD;  Location: [facility] CATH LAB;  Service: Cardiovascular;  Laterality: N/A;  . Permanent pacemaker insertion N/A 07/15/2014    Procedure: PERMANENT PACEMAKER INSERTION;  Surgeon: Deboraha Sprang, MD;  Location: [facility] CATH LAB;  Service: Cardiovascular;  Laterality: N/A;    Allergies  Allergen Reactions  . Omnipaque [Iohexol] Other (See Comments)    Decreased kidney function  . Primaxin [Imipenem] Hives  . Cephalosporins Rash    Blisters  . Clindamycin/Lincomycin Swelling and Rash    Current Outpatient Prescriptions  Medication Sig Dispense Refill  . allopurinol (ZYLOPRIM) 300 MG tablet Take 300 mg by mouth daily.    Marland Kitchen amoxicillin (AMOXIL) 500 MG capsule Take 500 mg by mouth as needed. As prescribed for dental work    . aspirin EC 81 MG tablet Take 81 mg by mouth daily.    Marland Kitchen  atorvastatin (LIPITOR) 80 MG tablet TAKE 1 TABLET (80 MG TOTAL) BY MOUTH DAILY BEFORE BREAKFAST. 30 tablet 6  . bisoprolol (ZEBETA) 5 MG tablet Take 0.5 tablets (2.5 mg total) by mouth 2 (two) times daily. 90 tablet 3  . bisoprolol (ZEBETA) 5 MG tablet TAKE 0.5 TABLETS (2.5 MG TOTAL) BY MOUTH AT BEDTIME. 15 tablet 3  . budesonide (PULMICORT) 0.25 MG/2ML nebulizer solution INHALE 1 VIAL VIA NEBULIZER TWICE A DAY (EVERY 12 HOURS) 120 mL 11  . calcitRIOL (ROCALTROL) 0.25 MCG capsule Take 0.25 mcg by mouth daily.     . fexofenadine (ALLEGRA) 180 MG tablet Take 1/2 tablet by mouth daily    . ipratropium-albuterol (DUONEB) 0.5-2.5 (3) MG/3ML SOLN TAKE 3 MLS BY NEBULIZATION EVERY 4 (FOUR) HOURS AS NEEDED. DX  J44.9 360 mL 1  . JANUVIA 100 MG tablet Take 100 mg by mouth daily.  6  . NITROSTAT 0.4 MG SL tablet PLACE 1 TABLET UNDER TONGUE EVERY 5 MINUTES AS NEEDED FOR CHEST PAIN 25 tablet 0  . pantoprazole (PROTONIX) 40 MG tablet Take 40 mg by mouth at bedtime.     . potassium chloride (K-DUR,KLOR-CON) 10 MEQ tablet Take two tablets (20 mEq) by mouth twice daily 360 tablet 3  . tamsulosin (FLOMAX) 0.4 MG CAPS capsule Take 0.4 mg by mouth at bedtime.     . torsemide (DEMADEX) 20 MG tablet Take two tablets (40 mg) by mouth twice daily 360 tablet 3   No current facility-administered medications for this visit.    Review of Systems : See HPI for pertinent positives and negatives.  Physical Examination  Filed Vitals:   08/17/15 1133 08/17/15 1135  BP: 116/73 120/81  Pulse: 88 88  Temp: 97 F (36.1 C)   Resp: 16   Height: 5' 6.5" (1.689 m)   Weight: 231 lb (104.781 kg)   SpO2: 94%    Body mass index is 36.73 kg/(m^2).  General: WDWN obese male in NAD GAIT: normal Eyes: PERRLA Pulmonary:  Slightly labored at rest, few rales in left posterior fields, little air movement in right posterior fields, few wheezes in left posterior fields.  Cardiac: regular rhythm,  no detected murmur.  VASCULAR  EXAM Carotid Bruits Right Left   Negative Negative    Aorta is not palpable. Radial pulses are 1+ palpable and equal.                                                                                                                            LE Pulses Right Left       FEMORAL  faintly palpable  1+ palpable        POPLITEAL  not palpable   2+ palpable       POSTERIOR TIBIAL  2+ palpable   1+ palpable        DORSALIS PEDIS      ANTERIOR TIBIAL not palpable  not palpable     Gastrointestinal: soft, nontender, BS WNL, no r/g,  no palpable masses.  Musculoskeletal: no muscle atrophy/wasting. M/S 5/5 throughout, extremities without ischemic changes. He is wearing knee high compression hose.  Neurologic: A&O X 3; Appropriate Affect, Speech is normal CN 2-12 intact except is slightly hard of hearing, is wearing bilateral hearing aids, pain and light touch intact in extremities, Motor exam as listed above.   Non-Invasive Vascular Imaging CAROTID DUPLEX 08/17/2015   CEREBROVASCULAR DUPLEX EVALUATION    INDICATION: Carotid artery disease    PREVIOUS INTERVENTION(S): N/A    DUPLEX EXAM: Carotid duplex    RIGHT  LEFT  Peak Systolic Velocities (cm/s) End Diastolic Velocities (cm/s) Plaque LOCATION Peak Systolic Velocities (cm/s) End Diastolic Velocities (cm/s) Plaque  52 15  CCA PROXIMAL 61 25   49 17  CCA MID  54 20   52 21 HT CCA DISTAL 52 21 HT  77 15  ECA 98 30 HT  64 24 HT ICA PROXIMAL 134 50 HT  48 21  ICA MID 104 34   49 22  ICA DISTAL 44 17     1.3 ICA / CCA Ratio (PSV) 2.4  Antegrade Vertebral Flow Antegrade  - Brachial Systolic Pressure (mmHg) -  Triphasic Brachial Artery Waveforms Triphasic    Plaque Morphology:  HM = Homogeneous, HT = Heterogeneous, CP = Calcific Plaque, SP = Smooth Plaque, IP = Irregular Plaque     ADDITIONAL FINDINGS: Multiphasic subclavian arteries    IMPRESSION: 1. Less than 40% right internal carotid artery stenosis 2. 40 - 59% left  internal carotid artery stenosis    Compared to the previous exam:  No change since exam of 08/11/2014       Assessment: [name] is a 75 y.o. male who has no history of stroke or TIA. Today's carotid duplex suggests minimal right ICA stenosis and mild/moderate left ICA stenosis. No change since exam of 08/11/2014.   His atherosclerotic risk factors include DM, former smoker, CAD, and obesity.  ABI's in February 2016 were normal with triphasic waveforms.   Plan: Follow-up in 1 year with Carotid Duplex scan.   I discussed in depth with the patient the nature of atherosclerosis, and emphasized the importance of maximal medical management including strict control of blood pressure, blood glucose, and lipid levels, obtaining regular exercise, and continued cessation of smoking.  The patient is aware that without maximal medical management the underlying atherosclerotic disease process will progress, limiting the benefit of any interventions. The patient was given information about stroke prevention and what symptoms should prompt the patient to seek immediate medical care. Thank you for allowing [place] to participate in this patient's care.  Clemon Chambers, RN, MSN, FNP-C [facility] Office: [phone]  Clinic Physician: Kellie Simmering  08/17/2015 10:56 AM

## 2015-08-18 NOTE — Addendum Note (Signed)
Addended by: Mena Goes on: 08/18/2015 10:01 AM   Modules accepted: Orders

## 2015-08-20 ENCOUNTER — Encounter: Payer: Self-pay | Admitting: Internal Medicine

## 2015-08-20 ENCOUNTER — Encounter (HOSPITAL_COMMUNITY): Payer: Self-pay

## 2015-08-20 ENCOUNTER — Ambulatory Visit (HOSPITAL_COMMUNITY)
Admission: RE | Admit: 2015-08-20 | Discharge: 2015-08-20 | Disposition: A | Discharge status: Home / Self Care | Payer: Medicare Other | Source: Ambulatory Visit | Attending: Cardiology | Admitting: Cardiology

## 2015-08-20 VITALS — BP 110/68 | HR 89 | Wt 232.5 lb

## 2015-08-20 DIAGNOSIS — Z954 Presence of other heart-valve replacement: Secondary | ICD-10-CM

## 2015-08-20 DIAGNOSIS — I350 Nonrheumatic aortic (valve) stenosis: Secondary | ICD-10-CM | POA: Diagnosis not present

## 2015-08-20 DIAGNOSIS — I6522 Occlusion and stenosis of left carotid artery: Secondary | ICD-10-CM | POA: Insufficient documentation

## 2015-08-20 DIAGNOSIS — G4733 Obstructive sleep apnea (adult) (pediatric): Secondary | ICD-10-CM | POA: Diagnosis not present

## 2015-08-20 DIAGNOSIS — I130 Hypertensive heart and chronic kidney disease with heart failure and stage 1 through stage 4 chronic kidney disease, or unspecified chronic kidney disease: Secondary | ICD-10-CM | POA: Diagnosis not present

## 2015-08-20 DIAGNOSIS — N189 Chronic kidney disease, unspecified: Secondary | ICD-10-CM | POA: Diagnosis not present

## 2015-08-20 DIAGNOSIS — M109 Gout, unspecified: Secondary | ICD-10-CM | POA: Insufficient documentation

## 2015-08-20 DIAGNOSIS — J449 Chronic obstructive pulmonary disease, unspecified: Secondary | ICD-10-CM | POA: Diagnosis not present

## 2015-08-20 DIAGNOSIS — Z951 Presence of aortocoronary bypass graft: Secondary | ICD-10-CM | POA: Insufficient documentation

## 2015-08-20 DIAGNOSIS — Z953 Presence of xenogenic heart valve: Secondary | ICD-10-CM | POA: Insufficient documentation

## 2015-08-20 DIAGNOSIS — Z923 Personal history of irradiation: Secondary | ICD-10-CM | POA: Insufficient documentation

## 2015-08-20 DIAGNOSIS — E1122 Type 2 diabetes mellitus with diabetic chronic kidney disease: Secondary | ICD-10-CM | POA: Diagnosis not present

## 2015-08-20 DIAGNOSIS — I5022 Chronic systolic (congestive) heart failure: Secondary | ICD-10-CM | POA: Diagnosis not present

## 2015-08-20 DIAGNOSIS — Z950 Presence of cardiac pacemaker: Secondary | ICD-10-CM | POA: Insufficient documentation

## 2015-08-20 DIAGNOSIS — I2510 Atherosclerotic heart disease of native coronary artery without angina pectoris: Secondary | ICD-10-CM | POA: Insufficient documentation

## 2015-08-20 DIAGNOSIS — Z79899 Other long term (current) drug therapy: Secondary | ICD-10-CM | POA: Insufficient documentation

## 2015-08-20 DIAGNOSIS — I441 Atrioventricular block, second degree: Secondary | ICD-10-CM | POA: Diagnosis not present

## 2015-08-20 DIAGNOSIS — Z8546 Personal history of malignant neoplasm of prostate: Secondary | ICD-10-CM | POA: Insufficient documentation

## 2015-08-20 DIAGNOSIS — Z87891 Personal history of nicotine dependence: Secondary | ICD-10-CM | POA: Insufficient documentation

## 2015-08-20 DIAGNOSIS — Z7982 Long term (current) use of aspirin: Secondary | ICD-10-CM | POA: Insufficient documentation

## 2015-08-20 DIAGNOSIS — Z7984 Long term (current) use of oral hypoglycemic drugs: Secondary | ICD-10-CM | POA: Insufficient documentation

## 2015-08-20 DIAGNOSIS — Z91041 Radiographic dye allergy status: Secondary | ICD-10-CM | POA: Insufficient documentation

## 2015-08-20 DIAGNOSIS — E785 Hyperlipidemia, unspecified: Secondary | ICD-10-CM | POA: Diagnosis not present

## 2015-08-20 MED ORDER — POTASSIUM CHLORIDE CRYS ER 10 MEQ PO TBCR: 30.0000 meq | EXTENDED_RELEASE_TABLET | Freq: Two times a day (BID) | ORAL | Status: DC

## 2015-08-20 MED ORDER — LISINOPRIL 2.5 MG PO TABS: 2.5000 mg | ORAL_TABLET | Freq: Every day | ORAL | Status: DC

## 2015-08-20 MED ORDER — TORSEMIDE 20 MG PO TABS: 60.0000 mg | ORAL_TABLET | Freq: Two times a day (BID) | ORAL | Status: DC

## 2015-08-20 NOTE — Patient Instructions (Signed)
INCREASE torsemide to 60 mg (3 tabs) twice daily.  INCREASE potassium to 30 meq (3 tabs) twice daily.  START Lisinopril 2.5 mg tablet once daily at bedtime.  Will schedule you for an echocardiogram stress test at [facility]. Address: [address] ([address] Floor), [place], [place] [address]  Phone: [phone] Avoid heavy meal before test (light meal or snack okay). Avoid caffeine, tobacco, and alcohol products 8 hrs before test.  Will need to see Dr. Caryl Comes as soon as possible for device revision.  Follow up with Dr. [name] in 2 weeks with lab work.  Do the following things EVERYDAY: 1) Weigh yourself in the morning before breakfast. Write it down and keep it in a log. 2) Take your medicines as prescribed 3) Eat low salt foods-Limit salt (sodium) to 2000 mg per day.  4) Stay as active as you can everyday 5) Limit all fluids for the day to less than 2 liters

## 2015-08-20 NOTE — Progress Notes (Signed)
Advanced Heart Failure Note   Patient ID: [name], male   DOB: 1941-01-06, 75 y.o.   MRN: [id number] PCP: Dr. Jilda Pierce Nephrologist: Dr [name] Vascular: Dr [name] HF: [name]  75 yo with history of ARDS/respiratory failure and tracheostomy in 07/2012 as well as DM, HTN, and COPD presented to the ER at [facility] with CHF in 10/2012. Patient had a prolonged hospitalization in 07/2012 with H1N1 influenza and pneumococcal PNA. This progressed to ARDS. He was intubated and ended up with tracheostomy. He had the trach removed. He also had a left empyema requiring chest tube, septic shock with elevated troponin, and AKI which resolved.  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   He was admitted again in 10/2012 from his nursing home with exertional dyspnea and orthopnea. He had gained 21 lbs. Echo at admission showed EF 35% with global hypokinesis that looked worse in the anteroseptal wall. He also was noted to have aortic stenosis rated as moderate to severe. He was diuresed and cathed, showing severe 3 vessel disease. He then had CABG-AVR (bioprosthetic valve).   He had a large AAA and underwent surgical repair in 07/2013.  He had AKI and Pseudomonas PNA post-operatively.  He was discharged to a nursing home. Echo in 1/15 showed EF 55-60% with mildly dilated and dysfunctional RV and a bioprosthetic aortic valve with mean gradient 32 mmHg (higher than expected).   Repeat limited echo in 08/2013 showed shower aortic valve mean gradient of 21 mmHg.   In 11/15, patient had a holter monitor placed for bradycardia.  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  Baseline bifascicular block.  His HR also was noted to drop into the 40s with exercise, suggesting exercise-associated heart block.  He underwent Medtronic-PCM placement in 1/16 by Dr [name].   Echo in 10/16 showed fall in EF to 30% with mean aortic valve gradient 21 mmHg.  Repeat echo in 2/17 showed EF 20-25%, mean aortic valve  gradient 24 mmHg with moderately decreased RV systolic function.  Cardiolite in 10/16 showed inferior scar with no ischemia.  He is RV pacing almost all the time.    He returns HF follow up. Overall feels ok but has increased fatigue. Able to walk on the treadmill 8 minutes. SOB walking to the mailbox (120 feet). Rides stationary bike 4 days a week 40 minutes at a time. Weight at home 229 pounds.  Increased cough during the day and at night.  Using nebulizer 3 times a day. Uses CPAP.  No chest pain.  +Bendopnea.  Ongoing hip pain limits his ambulation. Eating out a lot with relatively high sodium diet. Drinking < 2 liters.   PPM interrogated today => he is RV pacing and is pacer-dependent.   Labs (2/15): K 4, creatinine 1.6, BNP 166 Labs (3/15): Hgb 11.6, pro-BNP 1037, creatinine 1.76, BUN 30, LDL 75, HDL 26 Labs (8/15): K 3.9, creatinine 1.55, HCT 40.6, LDL 71, HDL 27 Labs (1/16): K 4.3, Creatinine 1.6, HCT 42.9 Labs (2/17): K 4.0 Creatinine 1.64.   ROS: All systems negative except as listed in HPI, PMH and Problem List.  PMH: 1. PNA (H1N1 influenza + pneumococcus) in [id number] complicated by respiratory failure/ARDS. Required tracheostomy, now weaned off. Had left empyema requiring chest tube.  Has developed post-infectious pulmonary fibrosis.  2. Prostate CA s/p radiation treatment. Has indwelling foley.  3. OSA: using CPAP 4. HTN  5. Hyperlipidemia  6. COPD: History of heavy smoking. PFTs (4/14) with mixed obstructive (COPD) and  restrictive (post-ARDS) picture.  7. Type II diabetes  8. Anemia of chronic disease.  9. Ischemic cardiomyopathy: Echo (1/14) with severely dilated LV, EF 30-35%, diffuse hypokinesis worse in the anteroseptal wall, grade II diastolic dysfunction, mild MR, AS interpreted as "moderate to severe" with aortic valve mean gradient 23 mmHg. Echo (4/14) witih EF 35-40%, mid to apical anteroseptal akinesis, moderate to severe AS with mean gradient 33 mmHg, AVA 0.91 cm^2. Echo  (5/14) post CABG showed EF 50%, anteroseptal hypokinesis, bioprosthetic aortic valve well-seated. Echo (1/15) with EF 55-60%, mild LVH, mildly dilated RV with mildly decreased systolic function, D-shaped interventricular septum, bioprosthetic aortic valve with mean gardient 32 mmHg.  Echo (3/15) with EF 55-60%, mild LVH, bioprosthetic aortic valve with mean gradient 21 mmHg (lower), no AI, RV moderately dilated with mild to moderately decreased systolic function.  Echo (10/16) with EF 30%, bioprosthetic aortic valve with mean gradient 21 mmHg.  Echo (2/17) with EF 20-25%, mild LVH, aortic valve mean gradient 24 mmHg/peak 39 mmHg, RV moderately dilated with moderately decreased systolic function.  10. Aortic stenosis: Moderate to severe by echo in 4/14. Bioprosthetic #21 [facility] Ease aortic valve replacement in 4/14.  11. CAD: LHC (4/14) with 95% pLAD, 70-80% ostial ramus, 70% mLCx, 90% pRCA, EF 35%. CABG 4/14 with LIMA-LAD, seq SVG-ramus and OM1, SVG-PDA. Cardiolite (10/16) with EF 42%, fixed inferior defect, no ischemia.   12. Carotid stenosis: Carotid dopplers (0000000) with A999333 LICA stenosis.  Carotids (123456) with 123456 LICA stenosis. Carotids (Q000111Q) with 123456 LICA stenosis.  13. AAA: 5/14 CT showed > 6 cm AAA, also right iliac aneurysm. Not stent graft candidate.  7/14 CT showed 7 cm AAA.  Surgical repair 1/15.  14. CKD: AKI in 7/14 from contrast nephropathy.  AKI in 1/15 after AAA repair.  15. Contrast allergy.  16. ABIs 9/14 were normal.  17. Gout 18. 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  HR drops with exercise (likely exercise-induced heart block).  Medtronic dual chamber PPM placed in 2016.  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   SH: Quit smoking in 1/14, married, no ETOH, lives in [place].   FH: No premature CAD  ROS: All systems reviewed and negative except as per HPI.    Current Outpatient  Prescriptions  Medication Sig Dispense Refill  . allopurinol (ZYLOPRIM) 100 MG tablet Take 100 mg by mouth at bedtime.    Marland Kitchen allopurinol (ZYLOPRIM) 300 MG tablet Take 300 mg by mouth daily.    Marland Kitchen aspirin EC 81 MG tablet Take 81 mg by mouth daily.    Marland Kitchen atorvastatin (LIPITOR) 80 MG tablet TAKE 1 TABLET (80 MG TOTAL) BY MOUTH DAILY BEFORE BREAKFAST. 30 tablet 6  . bisoprolol (ZEBETA) 5 MG tablet Take 0.5 tablets (2.5 mg total) by mouth 2 (two) times daily. 90 tablet 3  . budesonide (PULMICORT) 0.25 MG/2ML nebulizer solution INHALE 1 VIAL VIA NEBULIZER TWICE A DAY (EVERY 12 HOURS) 120 mL 11  . calcitRIOL (ROCALTROL) 0.25 MCG capsule Take 0.25 mcg by mouth daily.     . fexofenadine (ALLEGRA) 180 MG tablet Take 1/2 tablet by mouth daily    . ipratropium-albuterol (DUONEB) 0.5-2.5 (3) MG/3ML SOLN TAKE 3 MLS BY NEBULIZATION EVERY 4 (FOUR) HOURS AS NEEDED. DX J44.9 360 mL 1  . JANUVIA 100 MG tablet Take 50 mg by mouth daily.   6  . pantoprazole (PROTONIX) 40 MG tablet Take 40 mg by mouth at bedtime.     Marland Kitchen  potassium chloride (K-DUR,KLOR-CON) 10 MEQ tablet Take two tablets (20 mEq) by mouth twice daily 360 tablet 3  . tamsulosin (FLOMAX) 0.4 MG CAPS capsule Take 0.4 mg by mouth at bedtime.     . torsemide (DEMADEX) 20 MG tablet Take two tablets (40 mg) by mouth twice daily 360 tablet 3  . amoxicillin (AMOXIL) 500 MG capsule Take 500 mg by mouth as needed. Reported on 08/20/2015    . NITROSTAT 0.4 MG SL tablet PLACE 1 TABLET UNDER TONGUE EVERY 5 MINUTES AS NEEDED FOR CHEST PAIN (Patient not taking: Reported on 08/20/2015) 25 tablet 0   No current facility-administered medications for this encounter.    Filed Vitals:   08/20/15 1130  BP: 110/68  Pulse: 89  Weight: 232 lb 8 oz (105.461 kg)  SpO2: 96%   PHYSICAL EXAM: General:  NAD, overweight, wife present HEENT: normal Neck: Thick, JVP 8-9 cm. Carotids 2+ bilaterally; no bruits. No lymphadenopathy or thryomegaly. Cor: PMI normal. Regular rate &  rhythm. No rubs, gallops. 1/6 SEM RUSB.  Lungs:Decreased  Abdomen: Obese,,nontender, nondistended. No hepatosplenomegaly. No bruits or masses. +BS Extremities: no cyanosis, clubbing, rash.  R and LLE 1-2+ edema to knees bilaterally.  Neuro: alert & orientedx3, cranial nerves grossly intact. Moves all 4 extremities w/o difficulty. Affect pleasant.  ASSESSMENT & PLAN: 1. CAD: Status post CABG for 3VD.  Cardiolite in 10/16 with inferior scar, no ischemia.  No chest pain.  - Continue ASA, statin, and bisoprolol.  2. Chronic systolic CHF:  Most recent echo in 2/17 showed EF down to 20-25%.  No ischemia on Cardiolite.  Constant RV pacing may have led to decline in EF. Also need to consider bioprosthetic aortic valve stenosis (calculated AVA low with elevated mean gradient in setting of low EF).  NYHA class III symptoms and volume status elevated. - Increase torsemide to 60 mg twice a day.  Increase potassium to 30 meq twice a day.  - Add 2.5 mg lisinopril at bed time.   - Will need to follow creatinine closely, check BMET in 2 wks.   - Continue bisoprolol 2.5 mg twice a day.  - Encouraged to limit fluid intake and work on his diet with more vegetables and chicken and fish as opposed to high fat meats. - I will send him back to Dr [name] for CRT-D upgrade.  3. 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 Suspect that intrinsic lung disease (post-infectious pulmonary fibrosis) is a contributor to his dyspnea and hypoxemia. He is actually now off oxygen (just using CPAP at night).  4. Carotid stenosis: Followed at [facility]. 5. Hyperlipidemia: Continue statin. 6. AAA: s/p surgical repair. Stable on CT 08/11/14.  7. Status post bioprosthetic AVR: Gradient across the aortic valve has been elevated . There is some concern for possible low gradient severe bioprosthetic aortic stenosis based on most recent echo.   - He will  need antibiotic prophylaxis with dental work.  - I will arrange for  low dose dobutamine stress echo to assess for degree of bioprosthetic aortic stenosis. 8. CKD: Repeat BMET in 2 wks.    9. 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  S/p Medtronic PCM 1/16.  On pacemaker check today, patient is pacemaker-dependent with complete heart block.  He is persistently RV pacing.  Refer back to Dr [name] for upgrade to CRT-D.   10. OSA: Has sleep study next month.   Follow up in 2 weeks with Dr [name].   Amy  Clegg NP-C  08/20/2015   Patient see with NP, agree with the above note. EF has fallen by echo and he is volume overloaded on exam.  Mean gradient across the bioprosthetic aortic valve is increased.  He is peristently RV pacing.   - Refer back to Dr [name] for CRT-D. - Increase torsemide and add lisinopril as above, follow creatinine closely.  - Consider possibility of role for low gradient severe bioprosthetic aortic stenosis.  As above, will arrange for low dose DSE to assess degree of valvular stenosis.  - Followup 2 wks.   Loralie Champagne 08/22/2015

## 2015-08-23 ENCOUNTER — Encounter: Payer: Self-pay | Admitting: Internal Medicine

## 2015-08-23 ENCOUNTER — Ambulatory Visit (INDEPENDENT_AMBULATORY_CARE_PROVIDER_SITE_OTHER): Payer: Medicare Other | Admitting: Internal Medicine

## 2015-08-23 VITALS — BP 102/62 | HR 89 | Ht 66.5 in | Wt 232.0 lb

## 2015-08-23 DIAGNOSIS — I442 Atrioventricular block, complete: Secondary | ICD-10-CM

## 2015-08-23 DIAGNOSIS — I5022 Chronic systolic (congestive) heart failure: Secondary | ICD-10-CM | POA: Diagnosis not present

## 2015-08-23 DIAGNOSIS — I452 Bifascicular block: Secondary | ICD-10-CM

## 2015-08-23 DIAGNOSIS — R000 Tachycardia, unspecified: Secondary | ICD-10-CM

## 2015-08-23 DIAGNOSIS — I6523 Occlusion and stenosis of bilateral carotid arteries: Secondary | ICD-10-CM

## 2015-08-23 DIAGNOSIS — Z950 Presence of cardiac pacemaker: Secondary | ICD-10-CM

## 2015-08-23 LAB — CUP PACEART INCLINIC DEVICE CHECK
Battery Remaining Longevity: 109 mo
Battery Voltage: 3.01 V
Brady Statistic RA Percent Paced: 0.05 %
Brady Statistic RV Percent Paced: 99.68 %
Implantable Lead Implant Date: 20160113
Implantable Lead Location: 753860
Implantable Lead Model: 5076
Implantable Lead Model: 5076
Lead Channel Impedance Value: 380 Ohm
Lead Channel Impedance Value: 513 Ohm
Lead Channel Pacing Threshold Amplitude: 0.625 V
Lead Channel Pacing Threshold Pulse Width: 0.4 ms
Lead Channel Pacing Threshold Pulse Width: 0.4 ms
Lead Channel Sensing Intrinsic Amplitude: 2.625 mV
Lead Channel Sensing Intrinsic Amplitude: 3.625 mV
Lead Channel Setting Sensing Sensitivity: 2.8 mV
MDC IDC LEAD IMPLANT DT: 20160113
MDC IDC LEAD LOCATION: 753859
MDC IDC MSMT LEADCHNL RA IMPEDANCE VALUE: 456 Ohm
MDC IDC MSMT LEADCHNL RV IMPEDANCE VALUE: 456 Ohm
MDC IDC MSMT LEADCHNL RV PACING THRESHOLD AMPLITUDE: 0.375 V
MDC IDC MSMT LEADCHNL RV SENSING INTR AMPL: 8.125 mV
MDC IDC MSMT LEADCHNL RV SENSING INTR AMPL: 8.125 mV
MDC IDC SESS DTM: 20170220151502
MDC IDC SET LEADCHNL RA PACING AMPLITUDE: 1.5 V
MDC IDC SET LEADCHNL RV PACING AMPLITUDE: 2.25 V
MDC IDC SET LEADCHNL RV PACING PULSEWIDTH: 0.4 ms
MDC IDC STAT BRADY AP VP PERCENT: 0.05 %
MDC IDC STAT BRADY AP VS PERCENT: 0 %
MDC IDC STAT BRADY AS VP PERCENT: 99.63 %
MDC IDC STAT BRADY AS VS PERCENT: 0.32 %

## 2015-08-23 NOTE — Patient Instructions (Signed)
Medication Instructions: - no changes  Labwork: - pending date of procedure  Procedures/Testing: - Your physician has recommended that you have an upgrade to your device pending the results of your stress echo- Nira Conn will call you to arrange this once results are back on your stress echo  Follow-Up: - pending  Any Additional Special Instructions Will Be Listed Below (If Applicable).     If you need a refill on your cardiac medications before your next appointment, please call your pharmacy.

## 2015-08-23 NOTE — Progress Notes (Signed)
Patient Care Team: [name], MD as PCP - General (Internal Medicine) Larey Dresser, MD as Attending Physician (Cardiology)   HPI  [name] is a 75 y.o. 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 He underwent device implantation January 2016.  Cardiac evaluation has included an echocardiogram 1/15 demonstrated near-normal LV function.  Interval echocardiogram 10/26 demonstrated LV dysfunction with an EF of 30-35%. Myoview confirmed this.  Repeat Echo unfortunately showed further interval deterioration 20-25%.  He continues to show with shortness of breath fatigue. There is no chest pain. He's had no syncope.  His tachycardia remains an issue.    Past Medical History  Diagnosis Date  . Hypertension   . Hyperlipidemia   . Prediabetes   . CHF (congestive heart failure) ([place]) 07/2012; 10/17/2012  . COPD (chronic obstructive pulmonary disease) ([place])     a.  History of heavy smoking. PFTs (4/14) with mixed obstructive (COPD) and restrictive (post-ARDS) picture.   . Pneumonia 07/2012    PNA (H1N1 influenza + pneumococcus) in [id number] complicated by respiratory failure/ARDS. Required tracheostomy, now weaned off. Had left empyema requiring chest tube.   . Diabetes mellitus without complication ([place])   . History of blood transfusion     "lots since January" (10/17/2012)  . Ischemic cardiomyopathy   . Anemia of chronic disease   . Aortic stenosis     Moderate to severe by echo in 4/14. Bioprosthetic #21 [facility] Ease aortic valve replacement in 4/14.   Marland Kitchen CAD (coronary artery disease)     a. 4/14 CABG: LIMA-LAD, seq SVG-ramus & OM1, SVG-PDA  . Carotid arterial disease (HCC)     Carotid dopplers (0000000) with A999333 LICA stenosis.   Marland Kitchen AAA (abdominal aortic aneurysm) ([place])     5/14 CT showed > 6 cm AAA, also right iliac aneurysm. Not stent graft candidate.   . Esophageal reflux   . Ischemic  cardiomyopathy     a. 4/14 LHC: pLAD 95, ost ramus 70-80, mLCx 90, EF 30%  . Chronic systolic heart failure (HCC)     a. 1/14 ECHO: sev dil LV, EF30-35%, diff HK, mild MR, AS severe, AV grad 35 b. 8/14 ECHO: EF 55-60%, mild biopros AV sten mn grad. 25, RV mild dil, RA mild dil  . Complication of anesthesia     during last surgery had to be given special medicine b/c "something dropped" during surgery   . Anxiety     pt. admits that he has anxiety at times   . Shortness of breath     still goes deer hunting by himself  . OSA (obstructive sleep apnea)     on cpap- every sleep time.   . Chronic kidney disease     increased creatinine recently - being followed, near kidney failure fr. contrast dye   . Prostate cancer [facility])      s/p radiation treatment. - (PT. DENIES)Has indwelling foley.   . Arthritis     Past Surgical History  Procedure Laterality Date  . Transurethral microwave therapy  10/15/2012  . Nasal fracture surgery  1970's  . Tracheostomy  07/2012  . Tracheostomy closure  08/2012  . Anal fissure repair  2008  . Coronary artery bypass graft N/A 10/25/2012    Procedure: CORONARY ARTERY BYPASS GRAFTING (CABG);  Surgeon: Melrose Nakayama, MD;  Location: [facility];  Service: Open Heart Surgery;  Laterality: N/A;  times 4 using  left internal mammary artery and endoscopically harvested bilateral saphenous vein   . Aortic valve replacement N/A 10/25/2012    Procedure: AORTIC VALVE REPLACEMENT (AVR);  Surgeon: Melrose Nakayama, MD;  Location: [facility];  Service: Open Heart Surgery;  Laterality: N/A;  . Intraoperative transesophageal echocardiogram N/A 10/25/2012    Procedure: INTRAOPERATIVE TRANSESOPHAGEAL ECHOCARDIOGRAM;  Surgeon: Melrose Nakayama, MD;  Location: [facility];  Service: Open Heart Surgery;  Laterality: N/A;  . Cardiac valve replacement    . Abdominal aortic aneurysm repair N/A 07/10/2013    Procedure: Resection and Graftiong of perirenal AAA; Insertion 14 x 8 Hemashield Graft  Aorta to Left Common Iliac and to Right Common Femoral Artery With Ligastion of Right External and Interanl Iliac Artery;  Surgeon: Mal Misty, MD;  Location[facility] OR;  Service: Vascular;  Laterality: N/A;  . Left and right heart catheterization with coronary angiogram N/A 10/22/2012    Procedure: LEFT AND RIGHT HEART CATHETERIZATION WITH CORONARY ANGIOGRAM;  Surgeon: Larey Dresser, MD;  Location: [facility] CATH LAB;  Service: Cardiovascular;  Laterality: N/A;  . Permanent pacemaker insertion N/A 07/15/2014    Procedure: PERMANENT PACEMAKER INSERTION;  Surgeon: Deboraha Sprang, MD;  Location: [facility] CATH LAB;  Service: Cardiovascular;  Laterality: N/A;    Current Outpatient Prescriptions  Medication Sig Dispense Refill  . ipratropium-albuterol (DUONEB) 0.5-2.5 (3) MG/3ML SOLN TAKE 3 MLS BY NEBULIZATION EVERY 4 (FOUR) HOURS AS NEEDED FOR SHORTNESS OF BREATH. DX J44.9    . allopurinol (ZYLOPRIM) 100 MG tablet Take 100 mg by mouth at bedtime.    Marland Kitchen allopurinol (ZYLOPRIM) 300 MG tablet Take 300 mg by mouth daily.    Marland Kitchen amoxicillin (AMOXIL) 500 MG capsule Take 500 mg by mouth as directed. Reported on 08/20/2015    . aspirin EC 81 MG tablet Take 81 mg by mouth daily.    Marland Kitchen atorvastatin (LIPITOR) 80 MG tablet TAKE 1 TABLET (80 MG TOTAL) BY MOUTH DAILY BEFORE BREAKFAST. 30 tablet 6  . bisoprolol (ZEBETA) 5 MG tablet Take 0.5 tablets (2.5 mg total) by mouth 2 (two) times daily. 90 tablet 3  . budesonide (PULMICORT) 0.25 MG/2ML nebulizer solution INHALE 1 VIAL VIA NEBULIZER TWICE A DAY (EVERY 12 HOURS) 120 mL 11  . calcitRIOL (ROCALTROL) 0.25 MCG capsule Take 0.25 mcg by mouth daily.     . fexofenadine (ALLEGRA) 180 MG tablet Take 1/2 tablet by mouth daily    . JANUVIA 100 MG tablet Take 50 mg by mouth daily.   6  . lisinopril (PRINIVIL,ZESTRIL) 2.5 MG tablet Take 1 tablet (2.5 mg total) by mouth at bedtime. 30 tablet 6  . NITROSTAT 0.4 MG SL tablet PLACE 1 TABLET UNDER TONGUE EVERY 5 MINUTES AS NEEDED FOR CHEST PAIN  (Patient not taking: Reported on 08/20/2015) 25 tablet 0  . pantoprazole (PROTONIX) 40 MG tablet Take 40 mg by mouth at bedtime.     . potassium chloride (K-DUR,KLOR-CON) 10 MEQ tablet Take 3 tablets (30 mEq total) by mouth 2 (two) times daily. 360 tablet 3  . tamsulosin (FLOMAX) 0.4 MG CAPS capsule Take 0.4 mg by mouth at bedtime.     . torsemide (DEMADEX) 20 MG tablet Take 3 tablets (60 mg total) by mouth 2 (two) times daily. 360 tablet 3   No current facility-administered medications for this visit.    Allergies  Allergen Reactions  . Omnipaque [Iohexol] Other (See Comments)    Decreased kidney function  . Primaxin [Imipenem] Hives  . Cephalosporins Rash  Blisters  . Clindamycin/Lincomycin Swelling and Rash    Review of Systems negative except from HPI and PMH  Physical Exam BP 102/62 mmHg  Pulse 89  Ht 5' 6.5" (1.689 m)  Wt 232 lb (105.235 kg)  BMI 36.89 kg/m2 Well developed and well nourished in no acute distress HENT normal E scleral and icterus clear Neck Supple JVP9; carotids brisk and full Clear to ausculation Device pocket well healed; without hematoma or erythema.  There is no tethering Regular rate and rhythm, no murmurs gallops or rub Soft with active bowel sounds No clubbing cyanosis tr Edema Alert and oriented, grossly normal motor and sensory function Skin Warm and Dry  ECG demonstrates P synchronous pacing  Assessment and  Plan  High-grade heart block now device dependent  Obesity  Obstructive sleep apnea  Cardiomyopathy progressive question pacemaker  Congestive heart failure-chronic-mixed  Sinus tachycardia  Pacemaker-Medtronic The patient's device was interrogated.  The information was reviewed. No changes were made in the programming.     Ischemic heart disease with prior bypass surgery     He is progressive left ventricular dysfunction. It is reasonable this point to consider RV apical pacing is contributing to the progressive  deterioration. CRT upgrade is appropriate. At the time of the procedure, it would be reasonable to place a high voltage lead also.  Question of timing however is begged by the concern about aortic stenosis and whether there may be a need for surgical revision. I will discuss this with Dr. DM. The event that that were the case with suggest that we place an LV epicardial lead at that time as her some possibility that surgical repair of his aortic stenosis with associated with improved LV function  I've also discussed with Dr. DM the role of Ivabradine; we will defer this initiation for right now   We spent more than 50% of our >25 min visit in face to face counseling regarding the above

## 2015-08-24 ENCOUNTER — Ambulatory Visit (HOSPITAL_COMMUNITY): Payer: Medicare Other | Attending: Cardiology

## 2015-08-24 ENCOUNTER — Ambulatory Visit (HOSPITAL_BASED_OUTPATIENT_CLINIC_OR_DEPARTMENT_OTHER): Payer: Medicare Other

## 2015-08-24 DIAGNOSIS — I509 Heart failure, unspecified: Secondary | ICD-10-CM | POA: Diagnosis not present

## 2015-08-24 DIAGNOSIS — I2510 Atherosclerotic heart disease of native coronary artery without angina pectoris: Secondary | ICD-10-CM | POA: Insufficient documentation

## 2015-08-24 DIAGNOSIS — Z950 Presence of cardiac pacemaker: Secondary | ICD-10-CM | POA: Diagnosis not present

## 2015-08-24 DIAGNOSIS — Z6837 Body mass index (BMI) 37.0-37.9, adult: Secondary | ICD-10-CM | POA: Insufficient documentation

## 2015-08-24 DIAGNOSIS — E119 Type 2 diabetes mellitus without complications: Secondary | ICD-10-CM | POA: Diagnosis not present

## 2015-08-24 DIAGNOSIS — Z951 Presence of aortocoronary bypass graft: Secondary | ICD-10-CM | POA: Insufficient documentation

## 2015-08-24 DIAGNOSIS — I110 Hypertensive heart disease with heart failure: Secondary | ICD-10-CM | POA: Insufficient documentation

## 2015-08-24 DIAGNOSIS — I255 Ischemic cardiomyopathy: Secondary | ICD-10-CM | POA: Insufficient documentation

## 2015-08-24 DIAGNOSIS — I350 Nonrheumatic aortic (valve) stenosis: Secondary | ICD-10-CM | POA: Diagnosis present

## 2015-08-24 DIAGNOSIS — E785 Hyperlipidemia, unspecified: Secondary | ICD-10-CM | POA: Insufficient documentation

## 2015-08-24 DIAGNOSIS — R0989 Other specified symptoms and signs involving the circulatory and respiratory systems: Secondary | ICD-10-CM

## 2015-08-24 MED ORDER — DOBUTAMINE INFUSION FOR EP/ECHO/NUC (1000 MCG/ML)
20.0000 ug/kg/min | Freq: Once | INTRAVENOUS | Status: AC
  Administered 2015-08-24: 20 ug/kg/min via INTRAVENOUS

## 2015-08-27 ENCOUNTER — Encounter: Payer: Self-pay | Admitting: Cardiology

## 2015-08-27 NOTE — Telephone Encounter (Signed)
Mr. [name],  Per Dr. [name], the echo stress test showed that the valve is stable - it does not need to be replaced at this time.  He thinks that the change with your device that Dr. Caryl Comes discussed with you at your appointment would be helpful.  I will forward a message to Dr. Olin Pia nurse to talk with him about the next step for you.  Thank you, Christen Bame RN

## 2015-08-30 ENCOUNTER — Other Ambulatory Visit: Payer: Self-pay | Admitting: Adult Health

## 2015-08-31 ENCOUNTER — Other Ambulatory Visit: Payer: Self-pay | Admitting: Adult Health

## 2015-08-31 MED ORDER — BUDESONIDE 0.25 MG/2ML IN SUSP: RESPIRATORY_TRACT | Status: DC

## 2015-09-03 ENCOUNTER — Telehealth: Payer: Self-pay | Admitting: Internal Medicine

## 2015-09-03 ENCOUNTER — Telehealth: Payer: Self-pay

## 2015-09-03 ENCOUNTER — Ambulatory Visit (HOSPITAL_COMMUNITY)
Admission: RE | Admit: 2015-09-03 | Discharge: 2015-09-03 | Disposition: A | Discharge status: Home / Self Care | Payer: Medicare Other | Source: Ambulatory Visit | Attending: Cardiology | Admitting: Cardiology

## 2015-09-03 ENCOUNTER — Encounter (HOSPITAL_COMMUNITY): Payer: Self-pay

## 2015-09-03 VITALS — BP 100/58 | HR 81 | Wt 231.2 lb

## 2015-09-03 DIAGNOSIS — E785 Hyperlipidemia, unspecified: Secondary | ICD-10-CM | POA: Insufficient documentation

## 2015-09-03 DIAGNOSIS — Z8546 Personal history of malignant neoplasm of prostate: Secondary | ICD-10-CM | POA: Diagnosis not present

## 2015-09-03 DIAGNOSIS — I5032 Chronic diastolic (congestive) heart failure: Secondary | ICD-10-CM

## 2015-09-03 DIAGNOSIS — I442 Atrioventricular block, complete: Secondary | ICD-10-CM | POA: Diagnosis not present

## 2015-09-03 DIAGNOSIS — I130 Hypertensive heart and chronic kidney disease with heart failure and stage 1 through stage 4 chronic kidney disease, or unspecified chronic kidney disease: Secondary | ICD-10-CM | POA: Diagnosis not present

## 2015-09-03 DIAGNOSIS — Z87891 Personal history of nicotine dependence: Secondary | ICD-10-CM | POA: Insufficient documentation

## 2015-09-03 DIAGNOSIS — Z950 Presence of cardiac pacemaker: Secondary | ICD-10-CM | POA: Insufficient documentation

## 2015-09-03 DIAGNOSIS — N189 Chronic kidney disease, unspecified: Secondary | ICD-10-CM | POA: Diagnosis not present

## 2015-09-03 DIAGNOSIS — J449 Chronic obstructive pulmonary disease, unspecified: Secondary | ICD-10-CM | POA: Diagnosis not present

## 2015-09-03 DIAGNOSIS — Z79899 Other long term (current) drug therapy: Secondary | ICD-10-CM

## 2015-09-03 DIAGNOSIS — Z91041 Radiographic dye allergy status: Secondary | ICD-10-CM | POA: Diagnosis not present

## 2015-09-03 DIAGNOSIS — Z7984 Long term (current) use of oral hypoglycemic drugs: Secondary | ICD-10-CM | POA: Insufficient documentation

## 2015-09-03 DIAGNOSIS — N183 Chronic kidney disease, stage 3 unspecified: Secondary | ICD-10-CM

## 2015-09-03 DIAGNOSIS — Z951 Presence of aortocoronary bypass graft: Secondary | ICD-10-CM | POA: Diagnosis not present

## 2015-09-03 DIAGNOSIS — Z7982 Long term (current) use of aspirin: Secondary | ICD-10-CM | POA: Diagnosis not present

## 2015-09-03 DIAGNOSIS — I441 Atrioventricular block, second degree: Secondary | ICD-10-CM

## 2015-09-03 DIAGNOSIS — Z923 Personal history of irradiation: Secondary | ICD-10-CM | POA: Diagnosis not present

## 2015-09-03 DIAGNOSIS — I2510 Atherosclerotic heart disease of native coronary artery without angina pectoris: Secondary | ICD-10-CM

## 2015-09-03 DIAGNOSIS — Z01812 Encounter for preprocedural laboratory examination: Secondary | ICD-10-CM

## 2015-09-03 DIAGNOSIS — E1122 Type 2 diabetes mellitus with diabetic chronic kidney disease: Secondary | ICD-10-CM | POA: Insufficient documentation

## 2015-09-03 DIAGNOSIS — I5022 Chronic systolic (congestive) heart failure: Secondary | ICD-10-CM

## 2015-09-03 DIAGNOSIS — M109 Gout, unspecified: Secondary | ICD-10-CM | POA: Diagnosis not present

## 2015-09-03 DIAGNOSIS — I6522 Occlusion and stenosis of left carotid artery: Secondary | ICD-10-CM | POA: Insufficient documentation

## 2015-09-03 DIAGNOSIS — G4733 Obstructive sleep apnea (adult) (pediatric): Secondary | ICD-10-CM | POA: Diagnosis not present

## 2015-09-03 DIAGNOSIS — R42 Dizziness and giddiness: Secondary | ICD-10-CM | POA: Diagnosis not present

## 2015-09-03 DIAGNOSIS — Z953 Presence of xenogenic heart valve: Secondary | ICD-10-CM | POA: Diagnosis not present

## 2015-09-03 LAB — BASIC METABOLIC PANEL
ANION GAP: 11 (ref 5–15)
BUN: 75 mg/dL — ABNORMAL HIGH (ref 6–20)
CALCIUM: 9.5 mg/dL (ref 8.9–10.3)
CO2: 22 mmol/L (ref 22–32)
Chloride: 105 mmol/L (ref 101–111)
Creatinine, Ser: 2.39 mg/dL — ABNORMAL HIGH (ref 0.61–1.24)
GFR calc Af Amer: 29 mL/min — ABNORMAL LOW (ref >60)
GFR calc non Af Amer: 25 mL/min — ABNORMAL LOW (ref >60)
GLUCOSE: 169 mg/dL — AB (ref 65–99)
POTASSIUM: 5.1 mmol/L (ref 3.5–5.1)
Sodium: 138 mmol/L (ref 135–145)

## 2015-09-03 LAB — BRAIN NATRIURETIC PEPTIDE: B Natriuretic Peptide: 59.2 pg/mL (ref 0.0–100.0)

## 2015-09-03 MED ORDER — POTASSIUM CHLORIDE CRYS ER 10 MEQ PO TBCR: 20.0000 meq | EXTENDED_RELEASE_TABLET | Freq: Once | ORAL | Status: DC

## 2015-09-03 MED ORDER — IVABRADINE HCL 5 MG PO TABS
5.0000 mg | ORAL_TABLET | Freq: Two times a day (BID) | ORAL | Status: DC
Start: 2015-09-03 — End: 2016-01-03

## 2015-09-03 MED ORDER — TORSEMIDE 20 MG PO TABS: 40.0000 mg | ORAL_TABLET | Freq: Two times a day (BID) | ORAL | Status: DC

## 2015-09-03 NOTE — Telephone Encounter (Signed)
Called patient about Dr. Claris Gladden instructions. Patient verbalized understanding and will come in for lab work on Tuesday.  Notes Recorded by Larey Dresser, MD on 09/03/2015 at 5:06 PM Stop lisinopril. Hold torsemide x 2 days then decrease back to 40 mg bid. Hold KCl x 2 days then decrease to 20 once a day. Repeat BMET on Tuesday.

## 2015-09-03 NOTE — Progress Notes (Signed)
Advanced Heart Failure Note   Patient ID: [name], male   DOB: 1941/05/28, 75 y.o.   MRN: [id number] PCP: Dr. Jilda Pierce Nephrologist: Dr [name] Vascular: Dr [name] HF: [name]  75 yo with history of ARDS/respiratory failure and tracheostomy in 07/2012 as well as DM, HTN, and COPD presented to the ER at [facility] with CHF in 10/2012. Patient had a prolonged hospitalization in 07/2012 with H1N1 influenza and pneumococcal PNA. This progressed to ARDS. He was intubated and ended up with tracheostomy. He had the trach removed. He also had a left empyema requiring chest tube, septic shock with elevated troponin, and AKI which resolved.  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   He was admitted again in 10/2012 from his nursing home with exertional dyspnea and orthopnea. He had gained 21 lbs. Echo at admission showed EF 35% with global hypokinesis that looked worse in the anteroseptal wall. He also was noted to have aortic stenosis rated as moderate to severe. He was diuresed and cathed, showing severe 3 vessel disease. He then had CABG-AVR (bioprosthetic valve).   He had a large AAA and underwent surgical repair in 07/2013.  He had AKI and Pseudomonas PNA post-operatively.  He was discharged to a nursing home. Echo in 1/15 showed EF 55-60% with mildly dilated and dysfunctional RV and a bioprosthetic aortic valve with mean gradient 32 mmHg (higher than expected).   Repeat limited echo in 08/2013 showed shower aortic valve mean gradient of 21 mmHg.   In 11/15, patient had a holter monitor placed for bradycardia.  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  Baseline bifascicular block.  His HR also was noted to drop into the 40s with exercise, suggesting exercise-associated heart block.  He underwent Medtronic-PCM placement in 1/16 by Dr [name].   Echo in 10/16 showed fall in EF to 30% with mean aortic valve gradient 21 mmHg.  Repeat echo in 2/17 showed EF 20-25%, mean aortic valve  gradient 24 mmHg with moderately decreased RV systolic function.  Cardiolite in 10/16 showed inferior scar with no ischemia.  He is RV pacing almost all the time.  Low dose DSE was done in 2/17 to assess for significant bioprosthetic valve stenosis => mean gradient with dobutamine was only 31 mmHg, so suspect no more than moderate stenosis.   He returns HF follow up. At last visit we increase torsemide and added lisinopril. SBPs have been a little lower in the 90-100s, lowest reading 88.  Uses a wrist cuff that they don't think is very accurate. No lightheadedness or dizziness. Weight at home going down with increased torsemide ~ 228 now. Doesn't feel like he's peeing as much over the past few days. Still SOB with walking to the mailbox (120 feet), said he did OK this morning.  Had been riding stationary bike 4 days a week 40 minutes at a time, has had to stop at 35 minutes twice this week, was fine yesterday.  Still with nighttime coughing and nebulizer 2-3 times a day. Uses CPAP everynight.  No chest pain. Still has bendopnea.  Ongoing hip pain limits his ambulation. Watching salt better now, hasn't noticed much of a difference in fluid.   ECG (1/17): NSR, HR 101 with RV pacing  Labs (2/15): K 4, creatinine 1.6, BNP 166 Labs (3/15): Hgb 11.6, pro-BNP 1037, creatinine 1.76, BUN 30, LDL 75, HDL 26 Labs (8/15): K 3.9, creatinine 1.55, HCT 40.6, LDL 71, HDL 27 Labs (1/16): K 4.3, Creatinine 1.6, HCT 42.9  Labs (2/17): K 4.0 Creatinine 1.64, TSH normal.   ROS: All systems negative except as listed in HPI, PMH and Problem List.  PMH: 1. PNA (H1N1 influenza + pneumococcus) in [id number] complicated by respiratory failure/ARDS. Required tracheostomy, now weaned off. Had left empyema requiring chest tube.  Has developed post-infectious pulmonary fibrosis.  2. Prostate CA s/p radiation treatment. Has indwelling foley.  3. OSA: using CPAP 4. HTN  5. Hyperlipidemia  6. COPD: History of heavy smoking. PFTs (4/14)  with mixed obstructive (COPD) and restrictive (post-ARDS) picture.  7. Type II diabetes  8. Anemia of chronic disease.  9. Ischemic cardiomyopathy: Echo (1/14) with severely dilated LV, EF 30-35%, diffuse hypokinesis worse in the anteroseptal wall, grade II diastolic dysfunction, mild MR, AS interpreted as "moderate to severe" with aortic valve mean gradient 23 mmHg. Echo (4/14) witih EF 35-40%, mid to apical anteroseptal akinesis, moderate to severe AS with mean gradient 33 mmHg, AVA 0.91 cm^2. Echo (5/14) post CABG showed EF 50%, anteroseptal hypokinesis, bioprosthetic aortic valve well-seated. Echo (1/15) with EF 55-60%, mild LVH, mildly dilated RV with mildly decreased systolic function, D-shaped interventricular septum, bioprosthetic aortic valve with mean gardient 32 mmHg.  Echo (3/15) with EF 55-60%, mild LVH, bioprosthetic aortic valve with mean gradient 21 mmHg (lower), no AI, RV moderately dilated with mild to moderately decreased systolic function.  Echo (10/16) with EF 30%, bioprosthetic aortic valve with mean gradient 21 mmHg.  Echo (2/17) with EF 20-25%, mild LVH, aortic valve mean gradient 24 mmHg/peak 39 mmHg, RV moderately dilated with moderately decreased systolic function.  10. Aortic stenosis: Moderate to severe by echo in 4/14. Bioprosthetic #21 [facility] Ease aortic valve replacement in 4/14.  Low dose DSE (2/17) showed that Mr [name] likely has no more than moderate bioprosthetic valve stenosis with mean gradient up to 31 mmHg with dobutamine.  11. CAD: LHC (4/14) with 95% pLAD, 70-80% ostial ramus, 70% mLCx, 90% pRCA, EF 35%. CABG 4/14 with LIMA-LAD, seq SVG-ramus and OM1, SVG-PDA. Cardiolite (10/16) with EF 42%, fixed inferior defect, no ischemia.   12. Carotid stenosis: Carotid dopplers (0000000) with [id number] LICA stenosis.  Carotids (123456) with 123456 LICA stenosis. Carotids (Q000111Q) with 123456 LICA stenosis.  13. AAA: 5/14 CT showed > 6 cm AAA, also right iliac aneurysm. Not stent  graft candidate.  7/14 CT showed 7 cm AAA.  Surgical repair 1/15.  14. CKD: AKI in 7/14 from contrast nephropathy.  AKI in 1/15 after AAA repair.  15. Contrast allergy.  16. ABIs 9/14 were normal.  17. Gout 18. 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  HR drops with exercise (likely exercise-induced heart block).  Medtronic dual chamber PPM placed in 2016.  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   SH: Quit smoking in 1/14, married, no ETOH, lives in [place].   FH: No premature CAD  ROS: All systems reviewed and negative except as per HPI.    Current Outpatient Prescriptions  Medication Sig Dispense Refill  . allopurinol (ZYLOPRIM) 100 MG tablet Take 100 mg by mouth at bedtime. Also take a 300 mg tablet during the day    . allopurinol (ZYLOPRIM) 300 MG tablet Take 300 mg by mouth daily.    Marland Kitchen aspirin EC 81 MG tablet Take 81 mg by mouth daily.    Marland Kitchen atorvastatin (LIPITOR) 80 MG tablet TAKE 1 TABLET (80 MG TOTAL) BY MOUTH DAILY BEFORE BREAKFAST. 30 tablet 6  . bisoprolol (ZEBETA)  5 MG tablet Take 0.5 tablets (2.5 mg total) by mouth 2 (two) times daily. 90 tablet 3  . budesonide (PULMICORT) 0.25 MG/2ML nebulizer solution INHALE 1 VIAL VIA NEBULIZER EVERY 12 HOURS 120 mL 11  . calcitRIOL (ROCALTROL) 0.25 MCG capsule Take 0.25 mcg by mouth daily.     . fexofenadine (ALLEGRA) 180 MG tablet Take 1/2 tablet by mouth daily    . ipratropium-albuterol (DUONEB) 0.5-2.5 (3) MG/3ML SOLN TAKE 3 MLS BY NEBULIZATION EVERY 4 (FOUR) HOURS AS NEEDED FOR SHORTNESS OF BREATH. DX J44.9    . JANUVIA 100 MG tablet Take 50 mg by mouth daily.   6  . lisinopril (PRINIVIL,ZESTRIL) 2.5 MG tablet Take 1 tablet (2.5 mg total) by mouth at bedtime. 30 tablet 6  . pantoprazole (PROTONIX) 40 MG tablet Take 40 mg by mouth at bedtime.     . potassium chloride (K-DUR,KLOR-CON) 10 MEQ tablet Take 3 tablets (30 mEq total) by mouth 2 (two) times daily. 360  tablet 3  . tamsulosin (FLOMAX) 0.4 MG CAPS capsule Take 0.4 mg by mouth at bedtime.     . torsemide (DEMADEX) 20 MG tablet Take 3 tablets (60 mg total) by mouth 2 (two) times daily. 360 tablet 3  . amoxicillin (AMOXIL) 500 MG capsule Take 500 mg by mouth as directed. Reported on 09/03/2015    . NITROSTAT 0.4 MG SL tablet PLACE 1 TABLET UNDER TONGUE EVERY 5 MINUTES AS NEEDED FOR CHEST PAIN (Patient not taking: Reported on 09/03/2015) 25 tablet 0   No current facility-administered medications for this encounter.    Filed Vitals:   09/03/15 1034  BP: 100/58  Pulse: 81  Weight: 231 lb 4 oz (104.894 kg)  SpO2: 99%    Wt Readings from Last 3 Encounters:  09/03/15 231 lb 4 oz (104.894 kg)  08/23/15 232 lb (105.235 kg)  08/20/15 232 lb 8 oz (105.461 kg)    PHYSICAL EXAM: General:  NAD, overweight, wife present HEENT: normal Neck: Thick, JVP 7-8 cm. Carotids 2+ bilaterally; no bruits. No thyromegaly or nodule noted.  Cor: PMI normal. RRR. No rubs, gallops. 1/6 SEM RUSB.  Lungs: Decreased bilaterally Abdomen: Obese, NT, mildly distended, no HSM. No bruits or masses. +BS  Extremities: no cyanosis, clubbing, rash.  1+ ankle edema.  Neuro: alert & orientedx3, cranial nerves grossly intact. Moves all 4 extremities w/o difficulty. Affect pleasant.  ASSESSMENT & PLAN: 1. CAD: Status post CABG for 3VD.  Cardiolite in 10/16 with inferior scar, no ischemia.  No chest pain.  - Continue ASA, statin, and bisoprolol.  2. Chronic systolic CHF:  Most recent echo in 2/17 showed EF down to 20-25%.  No ischemia on Cardiolite.  Constant RV pacing may have led to decline in EF.  We considered bioprosthetic valve stenosis as cause of fall in EF, but low gradient DSE did not suggest severe stenosis.  NYHA class III, volume looks ok. - Continue torsemide 60 mg twice a day.  Continue potassium 30 meq twice a day. BMET today.  - Continue 2.5 mg lisinopril qhs => Getting BMET today to make sure that creatinine has not  gone up on lisinopril.  No BP room for titration. - Continue bisoprolol 2.5 mg twice a day.  - HR continues to run high, add Corlanor 5 mg bid.  I do not think that he can tolerate more bisoprolol with borderline BP.  - Encouraged to limit fluid intake and work on his diet with more vegetables and chicken and fish as opposed  to high fat meats. - Plan for CRT-D upgrade => will arrange with Dr [name] given constant RV pacing.   3. 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 Suspect that intrinsic lung disease (post-infectious pulmonary fibrosis) is a contributor to his dyspnea and hypoxemia. He is actually now off oxygen (just using CPAP at night).  4. Carotid stenosis: Followed at [facility]. 5. Hyperlipidemia: Continue statin. 6. AAA: s/p surgical repair. Stable on CT 08/11/14.  7. Status post bioprosthetic AVR: Gradient across the aortic valve has been elevated . There is some concern for possible low gradient severe bioprosthetic aortic stenosis based on most recent echo, but recent low dose DSE suggests that valvular stenosis is no more than moderate.   - He will need antibiotic prophylaxis with dental work.  8. CKD: BMET today. 9. 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  S/p Medtronic PCM 1/16.   - On pacemaker check last visit, patient is pacemaker-dependent with complete heart block.  He is persistently RV pacing.  Sent back to Dr [name] for upgrade to CRT-D.  Awaiting scheduling for CRT-D.  10. OSA: Has sleep study 10/2015.   Satira Mccallum Tillery PA-C 09/03/2015   Patient seen with PA, agree with the above note.  Volume status looks ok today.  He has occasional lightheadedness.  HR remains relatively higher, no BP room to increase bisoprolol.  Will add Corlanor 5 mg bid.  Need BMET with addition of lisinopril at low dose and increase in torsemide.  Low dose DSE did not suggest severe bioprosthetic aortic valve stenosis.  I think that RV pacing  has played a role in EF fall.  Will need LV lead placed by Dr. Caryl Pierce.   Loralie Champagne 09/05/2015

## 2015-09-03 NOTE — Addendum Note (Signed)
Addended by: Alvis Lemmings C on: 09/03/2015 06:13 PM   Modules accepted: Orders

## 2015-09-03 NOTE — Patient Instructions (Signed)
Routine lab work today. Will notify you of abnormal results, otherwise no news is good news!  START Corlanor 5mg  tablet twice daily.  Follow up 6 weeks.  Do the following things EVERYDAY: 1) Weigh yourself in the morning before breakfast. Write it down and keep it in a log. 2) Take your medicines as prescribed 3) Eat low salt foods-Limit salt (sodium) to 2000 mg per day.  4) Stay as active as you can everyday 5) Limit all fluids for the day to less than 2 liters

## 2015-09-03 NOTE — Telephone Encounter (Signed)
I called and spoke with the patient to schedule him for a CRT-D upgrade of his device. I offered the date of 09/22/15 to him and he is agreeable with this. Will schedule for a 9:30 am case with Dr. Caryl Comes. I advised the patient that I saw where another member of our staff had called him earlier today with medication recommendations from Dr. [name] and that Dr. [name] had wanted him to repeat his labs on 3/7. I advised the patient that if I could have him come in for labs on 3/8 this I could get his pre-procedure labs along with what Dr. [name] has ordered. He is driving from [place], so he is ok with this. He wanted me to notify Dr. [name] of the change in date. Will forward to Dr. [name] as an Juluis Rainier. I advised I will leave a copy of instructions for his procedure at the front desk to be picked up on Wednesday next week.

## 2015-09-03 NOTE — Telephone Encounter (Signed)
-----   Message from Larey Dresser, MD sent at 09/03/2015  5:08 PM EST ----- Please see my note on this patient. I tried to call him and could not get hold of him.  Please try him later today.

## 2015-09-07 ENCOUNTER — Other Ambulatory Visit: Payer: Medicare Other

## 2015-09-08 ENCOUNTER — Encounter: Payer: Self-pay | Admitting: *Deleted

## 2015-09-08 ENCOUNTER — Other Ambulatory Visit (INDEPENDENT_AMBULATORY_CARE_PROVIDER_SITE_OTHER): Payer: Medicare Other | Admitting: *Deleted

## 2015-09-08 DIAGNOSIS — I5022 Chronic systolic (congestive) heart failure: Secondary | ICD-10-CM

## 2015-09-08 DIAGNOSIS — Z79899 Other long term (current) drug therapy: Secondary | ICD-10-CM

## 2015-09-08 DIAGNOSIS — Z01812 Encounter for preprocedural laboratory examination: Secondary | ICD-10-CM

## 2015-09-08 LAB — CBC WITH DIFFERENTIAL/PLATELET
BASOS ABS: 0.1 10*3/uL (ref 0.0–0.1)
Basophils Relative: 1 % (ref 0–1)
Eosinophils Absolute: 0.2 10*3/uL (ref 0.0–0.7)
Eosinophils Relative: 3 % (ref 0–5)
HEMATOCRIT: 40.2 % (ref 39.0–52.0)
HEMOGLOBIN: 13.2 g/dL (ref 13.0–17.0)
LYMPHS ABS: 0.8 10*3/uL (ref 0.7–4.0)
LYMPHS PCT: 11 % — AB (ref 12–46)
MCH: 30 pg (ref 26.0–34.0)
MCHC: 32.8 g/dL (ref 30.0–36.0)
MCV: 91.4 fL (ref 78.0–100.0)
MONO ABS: 0.4 10*3/uL (ref 0.1–1.0)
MPV: 10.2 fL (ref 8.6–12.4)
Monocytes Relative: 6 % (ref 3–12)
NEUTROS ABS: 5.6 10*3/uL (ref 1.7–7.7)
Neutrophils Relative %: 79 % — ABNORMAL HIGH (ref 43–77)
Platelets: 126 10*3/uL — ABNORMAL LOW (ref 150–400)
RBC: 4.4 MIL/uL (ref 4.22–5.81)
RDW: 17.4 % — ABNORMAL HIGH (ref 11.5–15.5)
WBC: 7.1 10*3/uL (ref 4.0–10.5)

## 2015-09-08 LAB — BASIC METABOLIC PANEL
BUN: 51 mg/dL — ABNORMAL HIGH (ref 7–25)
CHLORIDE: 102 mmol/L (ref 98–110)
CO2: 27 mmol/L (ref 20–31)
Calcium: 9 mg/dL (ref 8.6–10.3)
Creat: 2.14 mg/dL — ABNORMAL HIGH (ref 0.70–1.18)
GLUCOSE: 133 mg/dL — AB (ref 65–99)
POTASSIUM: 4.4 mmol/L (ref 3.5–5.3)
SODIUM: 139 mmol/L (ref 135–146)

## 2015-09-09 LAB — PROTIME-INR
INR: 1.04 (ref <1.50)
Prothrombin Time: 13.7 seconds (ref 11.6–15.2)

## 2015-09-17 ENCOUNTER — Other Ambulatory Visit: Payer: Self-pay | Admitting: Pulmonary Disease

## 2015-09-18 ENCOUNTER — Encounter: Payer: Self-pay | Admitting: Internal Medicine

## 2015-09-20 ENCOUNTER — Telehealth: Payer: Self-pay | Admitting: Internal Medicine

## 2015-09-20 NOTE — Telephone Encounter (Signed)
New message      Daughter has the flu. All family member has to take tamaflu.  Is this going to affect his procedure scheduled for this week?

## 2015-09-20 NOTE — Telephone Encounter (Signed)
Addressed with patient via MyChart message.

## 2015-09-21 ENCOUNTER — Telehealth: Payer: Self-pay | Admitting: Pulmonary Disease

## 2015-09-21 NOTE — Telephone Encounter (Signed)
Spoke with pharmacist at [facility], states a fax has been sent for pt's duoneb that needs to be signed with an [id number] code attached.  This form is being refaxed to our office.  Will hold message until fax is received.

## 2015-09-22 ENCOUNTER — Encounter (HOSPITAL_COMMUNITY)
Admission: RE | Disposition: A | Discharge status: Home / Self Care | Payer: Self-pay | Source: Ambulatory Visit | Attending: Internal Medicine

## 2015-09-22 ENCOUNTER — Encounter (HOSPITAL_COMMUNITY): Payer: Self-pay | Admitting: General Practice

## 2015-09-22 ENCOUNTER — Ambulatory Visit (HOSPITAL_COMMUNITY)
Admission: RE | Admit: 2015-09-22 | Discharge: 2015-09-23 | Disposition: A | Discharge status: Home / Self Care | Payer: Medicare Other | Source: Ambulatory Visit | Attending: Internal Medicine | Admitting: Internal Medicine

## 2015-09-22 DIAGNOSIS — G4733 Obstructive sleep apnea (adult) (pediatric): Secondary | ICD-10-CM | POA: Insufficient documentation

## 2015-09-22 DIAGNOSIS — I714 Abdominal aortic aneurysm, without rupture: Secondary | ICD-10-CM | POA: Insufficient documentation

## 2015-09-22 DIAGNOSIS — K219 Gastro-esophageal reflux disease without esophagitis: Secondary | ICD-10-CM | POA: Diagnosis not present

## 2015-09-22 DIAGNOSIS — Z6837 Body mass index (BMI) 37.0-37.9, adult: Secondary | ICD-10-CM | POA: Diagnosis not present

## 2015-09-22 DIAGNOSIS — Z953 Presence of xenogenic heart valve: Secondary | ICD-10-CM | POA: Insufficient documentation

## 2015-09-22 DIAGNOSIS — Z7951 Long term (current) use of inhaled steroids: Secondary | ICD-10-CM | POA: Insufficient documentation

## 2015-09-22 DIAGNOSIS — I442 Atrioventricular block, complete: Secondary | ICD-10-CM

## 2015-09-22 DIAGNOSIS — Z959 Presence of cardiac and vascular implant and graft, unspecified: Secondary | ICD-10-CM

## 2015-09-22 DIAGNOSIS — Z79899 Other long term (current) drug therapy: Secondary | ICD-10-CM | POA: Insufficient documentation

## 2015-09-22 DIAGNOSIS — N189 Chronic kidney disease, unspecified: Secondary | ICD-10-CM | POA: Insufficient documentation

## 2015-09-22 DIAGNOSIS — I255 Ischemic cardiomyopathy: Secondary | ICD-10-CM | POA: Insufficient documentation

## 2015-09-22 DIAGNOSIS — Z87891 Personal history of nicotine dependence: Secondary | ICD-10-CM | POA: Insufficient documentation

## 2015-09-22 DIAGNOSIS — I5022 Chronic systolic (congestive) heart failure: Secondary | ICD-10-CM | POA: Insufficient documentation

## 2015-09-22 DIAGNOSIS — N183 Chronic kidney disease, stage 3 unspecified: Secondary | ICD-10-CM | POA: Diagnosis present

## 2015-09-22 DIAGNOSIS — I2510 Atherosclerotic heart disease of native coronary artery without angina pectoris: Secondary | ICD-10-CM | POA: Insufficient documentation

## 2015-09-22 DIAGNOSIS — Z7982 Long term (current) use of aspirin: Secondary | ICD-10-CM | POA: Diagnosis not present

## 2015-09-22 DIAGNOSIS — Z7984 Long term (current) use of oral hypoglycemic drugs: Secondary | ICD-10-CM | POA: Insufficient documentation

## 2015-09-22 DIAGNOSIS — E785 Hyperlipidemia, unspecified: Secondary | ICD-10-CM | POA: Diagnosis not present

## 2015-09-22 DIAGNOSIS — I509 Heart failure, unspecified: Secondary | ICD-10-CM | POA: Diagnosis not present

## 2015-09-22 DIAGNOSIS — I130 Hypertensive heart and chronic kidney disease with heart failure and stage 1 through stage 4 chronic kidney disease, or unspecified chronic kidney disease: Secondary | ICD-10-CM | POA: Insufficient documentation

## 2015-09-22 DIAGNOSIS — Z951 Presence of aortocoronary bypass graft: Secondary | ICD-10-CM | POA: Diagnosis not present

## 2015-09-22 DIAGNOSIS — J449 Chronic obstructive pulmonary disease, unspecified: Secondary | ICD-10-CM | POA: Diagnosis not present

## 2015-09-22 DIAGNOSIS — E669 Obesity, unspecified: Secondary | ICD-10-CM | POA: Diagnosis not present

## 2015-09-22 DIAGNOSIS — E1122 Type 2 diabetes mellitus with diabetic chronic kidney disease: Secondary | ICD-10-CM | POA: Diagnosis not present

## 2015-09-22 HISTORY — PX: EP IMPLANTABLE DEVICE: SHX172B

## 2015-09-22 HISTORY — PX: PACEMAKER REVISION: SHX5999

## 2015-09-22 LAB — BASIC METABOLIC PANEL
Anion gap: 16 — ABNORMAL HIGH (ref 5–15)
BUN: 37 mg/dL — AB (ref 6–20)
CHLORIDE: 104 mmol/L (ref 101–111)
CO2: 23 mmol/L (ref 22–32)
CREATININE: 1.51 mg/dL — AB (ref 0.61–1.24)
Calcium: 9.3 mg/dL (ref 8.9–10.3)
GFR calc Af Amer: 51 mL/min — ABNORMAL LOW (ref >60)
GFR calc non Af Amer: 44 mL/min — ABNORMAL LOW (ref >60)
GLUCOSE: 146 mg/dL — AB (ref 65–99)
POTASSIUM: 4.3 mmol/L (ref 3.5–5.1)
SODIUM: 143 mmol/L (ref 135–145)

## 2015-09-22 LAB — GLUCOSE, CAPILLARY: Glucose-Capillary: 149 mg/dL — ABNORMAL HIGH (ref 65–99)

## 2015-09-22 LAB — SURGICAL PCR SCREEN
MRSA, PCR: POSITIVE — AB
Staphylococcus aureus: POSITIVE — AB

## 2015-09-22 SURGERY — BIV UPGRADE
Anesthesia: LOCAL

## 2015-09-22 MED ORDER — MIDAZOLAM HCL 5 MG/5ML IJ SOLN
INTRAMUSCULAR | Status: DC | PRN
  Administered 2015-09-22 (×4): 1 mg via INTRAVENOUS

## 2015-09-22 MED ORDER — ASPIRIN EC 81 MG PO TBEC: 81.0000 mg | DELAYED_RELEASE_TABLET | Freq: Every day | ORAL | Status: DC

## 2015-09-22 MED ORDER — LORATADINE 10 MG PO TABS: 10.0000 mg | ORAL_TABLET | Freq: Every day | ORAL | Status: DC

## 2015-09-22 MED ORDER — PANTOPRAZOLE SODIUM 40 MG PO TBEC: 40.0000 mg | DELAYED_RELEASE_TABLET | Freq: Every day | ORAL | Status: DC

## 2015-09-22 MED ORDER — SODIUM CHLORIDE 0.9 % IR SOLN: 80.0000 mg | Status: DC

## 2015-09-22 MED ORDER — VANCOMYCIN HCL IN DEXTROSE 1-5 GM/200ML-% IV SOLN
1000.0000 mg | Freq: Two times a day (BID) | INTRAVENOUS | Status: AC
  Administered 2015-09-22: 1000 mg via INTRAVENOUS
  Filled 2015-09-22: qty 200

## 2015-09-22 MED ORDER — DIPHENHYDRAMINE HCL 50 MG/ML IJ SOLN
INTRAMUSCULAR | Status: AC
  Filled 2015-09-22: qty 1

## 2015-09-22 MED ORDER — ALLOPURINOL 100 MG PO TABS
100.0000 mg | ORAL_TABLET | Freq: Every day | ORAL | Status: DC
  Filled 2015-09-22: qty 1

## 2015-09-22 MED ORDER — OSELTAMIVIR PHOSPHATE 75 MG PO CAPS
75.0000 mg | ORAL_CAPSULE | Freq: Every day | ORAL | Status: DC
  Filled 2015-09-22 (×3): qty 1

## 2015-09-22 MED ORDER — MUPIROCIN 2 % EX OINT
TOPICAL_OINTMENT | CUTANEOUS | Status: AC
  Administered 2015-09-22: 1 via TOPICAL
  Filled 2015-09-22: qty 22

## 2015-09-22 MED ORDER — IPRATROPIUM-ALBUTEROL 0.5-2.5 (3) MG/3ML IN SOLN
3.0000 mL | Freq: Four times a day (QID) | RESPIRATORY_TRACT | Status: DC
  Administered 2015-09-22 – 2015-09-23 (×3): 3 mL via RESPIRATORY_TRACT
  Filled 2015-09-22 (×3): qty 3

## 2015-09-22 MED ORDER — IPRATROPIUM-ALBUTEROL 0.5-2.5 (3) MG/3ML IN SOLN: RESPIRATORY_TRACT | Status: DC

## 2015-09-22 MED ORDER — LIDOCAINE HCL (PF) 1 % IJ SOLN
INTRAMUSCULAR | Status: AC
  Filled 2015-09-22: qty 30

## 2015-09-22 MED ORDER — METHYLPREDNISOLONE SODIUM SUCC 125 MG IJ SOLR
INTRAMUSCULAR | Status: AC
Start: 2015-09-22 — End: 2015-09-22
  Filled 2015-09-22: qty 2

## 2015-09-22 MED ORDER — MUPIROCIN 2 % EX OINT
1.0000 "application " | TOPICAL_OINTMENT | Freq: Once | CUTANEOUS | Status: AC
  Administered 2015-09-22: 1 via TOPICAL

## 2015-09-22 MED ORDER — SODIUM CHLORIDE 0.9 % IR SOLN
Status: DC | PRN
  Administered 2015-09-22: 12:00:00

## 2015-09-22 MED ORDER — FENTANYL CITRATE (PF) 100 MCG/2ML IJ SOLN
INTRAMUSCULAR | Status: AC
  Filled 2015-09-22: qty 2

## 2015-09-22 MED ORDER — SODIUM CHLORIDE 0.9 % IV SOLN: INTRAVENOUS | Status: AC

## 2015-09-22 MED ORDER — POTASSIUM CHLORIDE CRYS ER 20 MEQ PO TBCR: 20.0000 meq | EXTENDED_RELEASE_TABLET | Freq: Every day | ORAL | Status: DC

## 2015-09-22 MED ORDER — IOHEXOL 350 MG/ML SOLN
INTRAVENOUS | Status: DC | PRN
  Administered 2015-09-22: 8.5 mL via INTRAVENOUS

## 2015-09-22 MED ORDER — CALCITRIOL 0.25 MCG PO CAPS
0.2500 ug | ORAL_CAPSULE | Freq: Every day | ORAL | Status: DC
  Filled 2015-09-22 (×3): qty 1

## 2015-09-22 MED ORDER — ACETAMINOPHEN 325 MG PO TABS: 325.0000 mg | ORAL_TABLET | ORAL | Status: DC | PRN

## 2015-09-22 MED ORDER — HEPARIN (PORCINE) IN NACL 2-0.9 UNIT/ML-% IJ SOLN
INTRAMUSCULAR | Status: AC
  Filled 2015-09-22: qty 500

## 2015-09-22 MED ORDER — AMOXICILLIN 500 MG PO CAPS: 2000.0000 mg | ORAL_CAPSULE | ORAL | Status: DC

## 2015-09-22 MED ORDER — DIPHENHYDRAMINE HCL 50 MG/ML IJ SOLN
INTRAMUSCULAR | Status: DC | PRN
Start: 2015-09-22 — End: 2015-09-22
  Administered 2015-09-22: 25 mg via INTRAVENOUS

## 2015-09-22 MED ORDER — MIDAZOLAM HCL 5 MG/5ML IJ SOLN
INTRAMUSCULAR | Status: AC
  Filled 2015-09-22: qty 5

## 2015-09-22 MED ORDER — VANCOMYCIN HCL IN DEXTROSE 1-5 GM/200ML-% IV SOLN
1000.0000 mg | INTRAVENOUS | Status: AC
  Administered 2015-09-22: 1000 mg via INTRAVENOUS
  Filled 2015-09-22: qty 200

## 2015-09-22 MED ORDER — BISOPROLOL FUMARATE 5 MG PO TABS
2.5000 mg | ORAL_TABLET | Freq: Every day | ORAL | Status: DC
  Filled 2015-09-22: qty 0.5

## 2015-09-22 MED ORDER — LIDOCAINE HCL (PF) 1 % IJ SOLN
INTRAMUSCULAR | Status: DC | PRN
  Administered 2015-09-22: 42 mL

## 2015-09-22 MED ORDER — SODIUM CHLORIDE 0.9 % IV SOLN: INTRAVENOUS | Status: DC

## 2015-09-22 MED ORDER — TORSEMIDE 20 MG PO TABS
40.0000 mg | ORAL_TABLET | Freq: Two times a day (BID) | ORAL | Status: DC
  Filled 2015-09-22 (×3): qty 2

## 2015-09-22 MED ORDER — LINAGLIPTIN 5 MG PO TABS: 5.0000 mg | ORAL_TABLET | Freq: Every day | ORAL | Status: DC

## 2015-09-22 MED ORDER — IVABRADINE HCL 5 MG PO TABS
5.0000 mg | ORAL_TABLET | Freq: Two times a day (BID) | ORAL | Status: DC
  Filled 2015-09-22 (×3): qty 1

## 2015-09-22 MED ORDER — FENTANYL CITRATE (PF) 100 MCG/2ML IJ SOLN
INTRAMUSCULAR | Status: DC | PRN
  Administered 2015-09-22: 12.5 ug via INTRAVENOUS
  Administered 2015-09-22: 25 ug via INTRAVENOUS
  Administered 2015-09-22: 12.5 ug via INTRAVENOUS
  Administered 2015-09-22: 25 ug via INTRAVENOUS

## 2015-09-22 MED ORDER — TAMSULOSIN HCL 0.4 MG PO CAPS: 0.4000 mg | ORAL_CAPSULE | Freq: Every day | ORAL | Status: DC

## 2015-09-22 MED ORDER — ATORVASTATIN CALCIUM 80 MG PO TABS: 80.0000 mg | ORAL_TABLET | Freq: Every day | ORAL | Status: DC

## 2015-09-22 MED ORDER — CHLORHEXIDINE GLUCONATE 4 % EX LIQD: 60.0000 mL | Freq: Once | CUTANEOUS | Status: DC

## 2015-09-22 MED ORDER — SODIUM CHLORIDE 0.9 % IV SOLN
INTRAVENOUS | Status: DC
  Administered 2015-09-22 (×2): via INTRAVENOUS

## 2015-09-22 MED ORDER — BUDESONIDE 0.25 MG/2ML IN SUSP
0.2500 mg | Freq: Two times a day (BID) | RESPIRATORY_TRACT | Status: DC
  Administered 2015-09-22 – 2015-09-23 (×2): 0.25 mg via RESPIRATORY_TRACT
  Filled 2015-09-22 (×2): qty 2

## 2015-09-22 MED ORDER — METHYLPREDNISOLONE SODIUM SUCC 125 MG IJ SOLR
INTRAMUSCULAR | Status: DC | PRN
Start: 2015-09-22 — End: 2015-09-22
  Administered 2015-09-22: 125 mg via INTRAVENOUS

## 2015-09-22 MED ORDER — ALLOPURINOL 300 MG PO TABS
300.0000 mg | ORAL_TABLET | ORAL | Status: DC
  Filled 2015-09-22: qty 1

## 2015-09-22 MED ORDER — ONDANSETRON HCL 4 MG/2ML IJ SOLN
4.0000 mg | Freq: Four times a day (QID) | INTRAMUSCULAR | Status: DC | PRN
Start: 2015-09-22 — End: 2015-09-23

## 2015-09-22 MED ORDER — HEPARIN SODIUM (PORCINE) 1000 UNIT/ML IJ SOLN
INTRAMUSCULAR | Status: DC | PRN
  Administered 2015-09-22: 500 [IU] via INTRAVENOUS

## 2015-09-22 MED ORDER — SODIUM CHLORIDE 0.9 % IR SOLN
Status: AC
  Filled 2015-09-22: qty 2

## 2015-09-22 SURGICAL SUPPLY — 18 items
ALLURE CRT PM3262 (Pacemaker) ×2 IMPLANT
BALLN COR SINUS VENO 6FR 80 (BALLOONS) ×2
BALLOON COR SINUS VENO 6FR 80 (BALLOONS) ×1 IMPLANT
CABLE SURGICAL S-101-97-12 (CABLE) ×2 IMPLANT
CATH CPS DIRECT 135 DS2C020 (CATHETERS) ×2 IMPLANT
HEMOSTAT SURGICEL 2X4 FIBR (HEMOSTASIS) ×2 IMPLANT
KIT ESSENTIALS PG (KITS) ×2 IMPLANT
LEAD QUARTET 1456Q-86 (Lead) ×1 IMPLANT
PACEMAKER ALLURE CRT (Pacemaker) ×1 IMPLANT
PAD DEFIB LIFELINK (PAD) ×2 IMPLANT
QUARTET 1456Q-86 (Lead) ×2 IMPLANT
SET INTRODUCER MICROPUNCT 5F (INTRODUCER) ×2 IMPLANT
SHEATH CLASSIC 9.5F (SHEATH) ×4 IMPLANT
SHIELD RADPAD SCOOP 12X17 (MISCELLANEOUS) ×2 IMPLANT
SLITTER UNIVERSAL DS2A003 (MISCELLANEOUS) ×2 IMPLANT
TRAY PACEMAKER INSERTION (PACKS) ×2 IMPLANT
WIRE ACUITY WHISPER EDS 4648 (WIRE) ×4 IMPLANT
WIRE HI TORQ VERSACORE-J 145CM (WIRE) ×2 IMPLANT

## 2015-09-22 NOTE — Discharge Summary (Signed)
ELECTROPHYSIOLOGY PROCEDURE DISCHARGE SUMMARY    Patient ID: [name],  MRN: [id number], DOB/AGE: 1941-01-28 75 y.o.  Admit date: 09/22/2015 Discharge date: 09/23/15  Primary Care Physician: [name], MD  Primary Cardiologist: Dr. [name] Electrophysiologist: Dr. Caryl Comes  Primary Discharge Diagnosis:  1. ICM 2. Chronic CHF (systolic)  Secondary Discharge Diagnosis:  1. CAD (CABG) 2. VHD (AVR-bioprosthetic) with mod AS 3. 2nd degree AVblock II, conduction disease 4. CRI 5. AAA repaired  Allergies  Allergen Reactions  . Omnipaque [Iohexol] Other (See Comments)    Decreased kidney function  . Primaxin [Imipenem] Hives  . Cephalosporins Rash    Blisters  . Clindamycin/Lincomycin Swelling and Rash     Procedures This Admission:  1.  Upgrade of PPM to CRT- P (patient declined ICD) on 09/22/15 by Dr Caryl Comes.  The patient received a STJ ICD, please see procedure report for details. There were no immediate post procedure complications. 2.  CXR on 09/23/15 demonstrated no pneumothorax status post device implantation.   Brief HPI: [name] is a 75 y.o. male was referred PPM upgrade to CRT -D device in the outpatient setting secondary to cardiomyopathy and constant RV pacing, the patient though declined ICD.  Past medical history includes ICM, VHD, heart block, CRI, CHF, COPD, DM .  The patient has persistent LV dysfunction despite guideline directed therapy.  Risks, benefits, and alternatives to ICD implantation were reviewed with the patient who wished to proceed.   Hospital Course:  The patient was admitted and underwent upgrade of dual chamber PPM to CRT-pacer with details as outlined above. He was monitored on telemetry overnight which demonstrated SR, V paced  Left chest was without hematoma or ecchymosis.  The device was interrogated and found to be functioning normally.  CXR was obtained and demonstrated no pneumothorax status post device implantation.  Wound  care, arm mobility, and restrictions were reviewed with the patient.  He is instructed on bactroban nasal tx to complete 5 day course, the patient was on tamiflu at home, to complete as directed by his PMD/prescribing MD  He feels well, is afebrile, without signs/symptoms of illness.  States he never did have symptoms of flu but apparently had sick exposure in the family and was given as preventative measures.  The patient relates that his Lisinopril was discontinued secondary to low BP and will defer this to Dr. [name] service, and his bisoprolol down-titrated to the stated dose. The patient was examined by Dr. Caryl Comes and considered stable for discharge to home.   The patient's discharge medications include an ACE-I (stopped with reports of hypotension) and beta blocker (bisoprolol).   Physical Exam: Filed Vitals:   09/23/15 0324 09/23/15 0649 09/23/15 0850 09/23/15 0853  BP:  100/40    Pulse:  78    Temp:  97.7 F (36.5 C)    TempSrc:  Oral    Resp:  22    Height:      Weight:  231 lb 7.7 oz (105 kg)    SpO2: 96% 91% 95% 95%    GEN- The patient is obese, well appearing, alert and oriented x 3 today.   HEENT: normocephalic, atraumatic; sclera clear, conjunctiva pink; hearing intact; oropharynx clear Lungs- Clear to ausculation bilaterally, normal work of breathing.  No wheezes, rales, rhonchi Heart- Regular rate and rhythm, no murmurs, rubs or gallops, PMI not laterally displaced GI- soft, non-tender, non-distended, bowel sounds present Extremities- no clubbing, cyanosis, or edema MS- no significant deformity or atrophy Skin-  warm and dry, no rash or lesion, left chest without hematoma/ecchymosis Psych- euthymic mood, full affect Neuro- no gross defecits  Labs:   Lab Results  Component Value Date   WBC 7.1 09/08/2015   HGB 13.2 09/08/2015   HCT 40.2 09/08/2015   MCV 91.4 09/08/2015   PLT 126* 09/08/2015     Recent Labs Lab 09/22/15 0917  NA 143  K 4.3  CL 104  CO2 23    BUN 37*  CREATININE 1.51*  CALCIUM 9.3  GLUCOSE 146*    Discharge Medications:    Medication List    TAKE these medications        allopurinol 300 MG tablet  Commonly known as:  ZYLOPRIM  Take 300 mg by mouth every morning.     allopurinol 100 MG tablet  Commonly known as:  ZYLOPRIM  Take 100 mg by mouth at bedtime. Also take a 300 mg tablet during the day     amoxicillin 500 MG capsule  Commonly known as:  AMOXIL  Take 2,000 mg by mouth as directed. For Dental Procedures     aspirin EC 81 MG tablet  Take 81 mg by mouth daily.     atorvastatin 80 MG tablet  Commonly known as:  LIPITOR  TAKE 1 TABLET (80 MG TOTAL) BY MOUTH DAILY BEFORE BREAKFAST.     bisoprolol 5 MG tablet  Commonly known as:  ZEBETA  Take 0.5 tablets (2.5 mg total) by mouth at bedtime.     budesonide 0.25 MG/2ML nebulizer solution  Commonly known as:  PULMICORT  INHALE 1 VIAL VIA NEBULIZER EVERY 12 HOURS     calcitRIOL 0.25 MCG capsule  Commonly known as:  ROCALTROL  Take 0.25 mcg by mouth daily.     fexofenadine 180 MG tablet  Commonly known as:  ALLEGRA  Take 90 mg by mouth daily. Take 1/2 tablet by mouth daily     ipratropium-albuterol 0.5-2.5 (3) MG/3ML Soln  Commonly known as:  DUONEB  TAKE 3 MLS BY NEBULIZATION EVERY 4 (FOUR) HOURS AS NEEDED.     ivabradine 5 MG Tabs tablet  Commonly known as:  CORLANOR  Take 1 tablet (5 mg total) by mouth 2 (two) times daily with a meal.     JANUVIA 100 MG tablet  Generic drug:  sitaGLIPtin  Take 50 mg by mouth daily.     mupirocin ointment 2 %  Commonly known as:  BACTROBAN  Place 1 application into the nose 2 (two) times daily.     NITROSTAT 0.4 MG SL tablet  Generic drug:  nitroGLYCERIN  PLACE 1 TABLET UNDER TONGUE EVERY 5 MINUTES AS NEEDED FOR CHEST PAIN     oseltamivir 75 MG capsule  Commonly known as:  TAMIFLU  Take 75 mg by mouth daily.  Notes to Patient:  Take/complete this medication as you were instructed by the original  prescribing physician     pantoprazole 40 MG tablet  Commonly known as:  PROTONIX  Take 40 mg by mouth at bedtime.     potassium chloride 10 MEQ tablet  Commonly known as:  K-DUR,KLOR-CON  Take 2 tablets (20 mEq total) by mouth daily.     tamsulosin 0.4 MG Caps capsule  Commonly known as:  FLOMAX  Take 0.4 mg by mouth at bedtime.     torsemide 20 MG tablet  Commonly known as:  DEMADEX  Take 2 tablets (40 mg total) by mouth 2 (two) times daily.  Disposition: Home Discharge Instructions    Diet - low sodium heart healthy    Complete by:  As directed      Increase activity slowly    Complete by:  As directed           Follow-up Information    Follow up with [facility] On 10/04/2015.   Specialty:  Cardiology   Why:  9:30AM wound check   Contact information:   [address] [place] [address] [phone]      Follow up with Virl Axe, MD On 12/28/2015.   Specialty:  Cardiology   Why:  3:15PM   Contact information:   [address] [address] [phone]       Duration of Discharge Encounter: Greater than 30 minutes including physician time.  Venetia Night, PA-C 09/23/2015 9:08 AM

## 2015-09-22 NOTE — Progress Notes (Signed)
Nils Flack, RN, anager of unit states she spoke with Dr. Caryl Comes and that Dr. Caryl Comes is fine with patient taking his home medication. We are not to verify the medication was given in the [facility] but to chart as refused.

## 2015-09-22 NOTE — Plan of Care (Signed)
Problem: Phase I Progression Outcomes Goal: IV antibiotics available for on-call Outcome: Progressing Patient refused all other hospital medications except for IV antibiotics. Patient verbalized understanding that these reduce the chance of infection.

## 2015-09-22 NOTE — Progress Notes (Signed)
Patient placed on CPAP on auto mode for procedure.  Patient wears CPAP at home.  Currently tolerating well.  Will continue to monitor.

## 2015-09-22 NOTE — Telephone Encounter (Signed)
Spoke with pharmacist at [facility] and she stated that Rx could be resent with ICD-10 code included. Rx resent via EScripts.

## 2015-09-22 NOTE — Interval H&P Note (Signed)
History and Physical Interval Note:  09/22/2015 9:37 AM  [name]  has presented today for surgery, with the diagnosis of chf  The various methods of treatment have been discussed with the patient and family. After consideration of risks, benefits and other options for treatment, the patient has consented to  Procedure(s): BiV Upgrade (N/A) as a surgical intervention .  The patient's history has been reviewed, patient examined, no change in status, stable for surgery.  I have reviewed the patient's chart and labs.  Questions were answered to the patient's satisfaction.     Virl Axe  Cr improved  1.6  Functional status stable  Pt desires CRT-P upgrade and not CRT-D  Will use CPAP support

## 2015-09-22 NOTE — H&P (View-Only) (Signed)
Patient Care Team: [name], MD as PCP - General (Internal Medicine) Larey Dresser, MD as Attending Physician (Cardiology)   HPI  [name] is a 75 y.o. 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 He underwent device implantation January 2016.  Cardiac evaluation has included an echocardiogram 1/15 demonstrated near-normal LV function.  Interval echocardiogram 10/26 demonstrated LV dysfunction with an EF of 30-35%. Myoview confirmed this.  Repeat Echo unfortunately showed further interval deterioration 20-25%.  He continues to show with shortness of breath fatigue. There is no chest pain. He's had no syncope.  His tachycardia remains an issue.    Past Medical History  Diagnosis Date  . Hypertension   . Hyperlipidemia   . Prediabetes   . CHF (congestive heart failure) ([place]) 07/2012; 10/17/2012  . COPD (chronic obstructive pulmonary disease) ([place])     a.  History of heavy smoking. PFTs (4/14) with mixed obstructive (COPD) and restrictive (post-ARDS) picture.   . Pneumonia 07/2012    PNA (H1N1 influenza + pneumococcus) in [id number] complicated by respiratory failure/ARDS. Required tracheostomy, now weaned off. Had left empyema requiring chest tube.   . Diabetes mellitus without complication ([place])   . History of blood transfusion     "lots since January" (10/17/2012)  . Ischemic cardiomyopathy   . Anemia of chronic disease   . Aortic stenosis     Moderate to severe by echo in 4/14. Bioprosthetic #21 [facility] Ease aortic valve replacement in 4/14.   Marland Kitchen CAD (coronary artery disease)     a. 4/14 CABG: LIMA-LAD, seq SVG-ramus & OM1, SVG-PDA  . Carotid arterial disease (HCC)     Carotid dopplers (0000000) with [id number] LICA stenosis.   Marland Kitchen AAA (abdominal aortic aneurysm) ([place])     5/14 CT showed > 6 cm AAA, also right iliac aneurysm. Not stent graft candidate.   . Esophageal reflux   . Ischemic  cardiomyopathy     a. 4/14 LHC: pLAD 95, ost ramus 70-80, mLCx 90, EF 30%  . Chronic systolic heart failure (HCC)     a. 1/14 ECHO: sev dil LV, EF30-35%, diff HK, mild MR, AS severe, AV grad 35 b. 8/14 ECHO: EF 55-60%, mild biopros AV sten mn grad. 25, RV mild dil, RA mild dil  . Complication of anesthesia     during last surgery had to be given special medicine b/c "something dropped" during surgery   . Anxiety     pt. admits that he has anxiety at times   . Shortness of breath     still goes deer hunting by himself  . OSA (obstructive sleep apnea)     on cpap- every sleep time.   . Chronic kidney disease     increased creatinine recently - being followed, near kidney failure fr. contrast dye   . Prostate cancer [facility])      s/p radiation treatment. - (PT. DENIES)Has indwelling foley.   . Arthritis     Past Surgical History  Procedure Laterality Date  . Transurethral microwave therapy  10/15/2012  . Nasal fracture surgery  1970's  . Tracheostomy  07/2012  . Tracheostomy closure  08/2012  . Anal fissure repair  2008  . Coronary artery bypass graft N/A 10/25/2012    Procedure: CORONARY ARTERY BYPASS GRAFTING (CABG);  Surgeon: Melrose Nakayama, MD;  Location: [place];  Service: Open Heart Surgery;  Laterality: N/A;  times 4 using  left internal mammary artery and endoscopically harvested bilateral saphenous vein   . Aortic valve replacement N/A 10/25/2012    Procedure: AORTIC VALVE REPLACEMENT (AVR);  Surgeon: Melrose Nakayama, MD;  Location: [place];  Service: Open Heart Surgery;  Laterality: N/A;  . Intraoperative transesophageal echocardiogram N/A 10/25/2012    Procedure: INTRAOPERATIVE TRANSESOPHAGEAL ECHOCARDIOGRAM;  Surgeon: Melrose Nakayama, MD;  Location: [place];  Service: Open Heart Surgery;  Laterality: N/A;  . Cardiac valve replacement    . Abdominal aortic aneurysm repair N/A 07/10/2013    Procedure: Resection and Graftiong of perirenal AAA; Insertion 14 x 8 Hemashield Graft  Aorta to Left Common Iliac and to Right Common Femoral Artery With Ligastion of Right External and Interanl Iliac Artery;  Surgeon: Mal Misty, MD;  Location: [facility] OR;  Service: Vascular;  Laterality: N/A;  . Left and right heart catheterization with coronary angiogram N/A 10/22/2012    Procedure: LEFT AND RIGHT HEART CATHETERIZATION WITH CORONARY ANGIOGRAM;  Surgeon: Larey Dresser, MD;  Location: [facility] CATH LAB;  Service: Cardiovascular;  Laterality: N/A;  . Permanent pacemaker insertion N/A 07/15/2014    Procedure: PERMANENT PACEMAKER INSERTION;  Surgeon: Deboraha Sprang, MD;  Location: [facility] CATH LAB;  Service: Cardiovascular;  Laterality: N/A;    Current Outpatient Prescriptions  Medication Sig Dispense Refill  . ipratropium-albuterol (DUONEB) 0.5-2.5 (3) MG/3ML SOLN TAKE 3 MLS BY NEBULIZATION EVERY 4 (FOUR) HOURS AS NEEDED FOR SHORTNESS OF BREATH. DX J44.9    . allopurinol (ZYLOPRIM) 100 MG tablet Take 100 mg by mouth at bedtime.    Marland Kitchen allopurinol (ZYLOPRIM) 300 MG tablet Take 300 mg by mouth daily.    Marland Kitchen amoxicillin (AMOXIL) 500 MG capsule Take 500 mg by mouth as directed. Reported on 08/20/2015    . aspirin EC 81 MG tablet Take 81 mg by mouth daily.    Marland Kitchen atorvastatin (LIPITOR) 80 MG tablet TAKE 1 TABLET (80 MG TOTAL) BY MOUTH DAILY BEFORE BREAKFAST. 30 tablet 6  . bisoprolol (ZEBETA) 5 MG tablet Take 0.5 tablets (2.5 mg total) by mouth 2 (two) times daily. 90 tablet 3  . budesonide (PULMICORT) 0.25 MG/2ML nebulizer solution INHALE 1 VIAL VIA NEBULIZER TWICE A DAY (EVERY 12 HOURS) 120 mL 11  . calcitRIOL (ROCALTROL) 0.25 MCG capsule Take 0.25 mcg by mouth daily.     . fexofenadine (ALLEGRA) 180 MG tablet Take 1/2 tablet by mouth daily    . JANUVIA 100 MG tablet Take 50 mg by mouth daily.   6  . lisinopril (PRINIVIL,ZESTRIL) 2.5 MG tablet Take 1 tablet (2.5 mg total) by mouth at bedtime. 30 tablet 6  . NITROSTAT 0.4 MG SL tablet PLACE 1 TABLET UNDER TONGUE EVERY 5 MINUTES AS NEEDED FOR CHEST PAIN  (Patient not taking: Reported on 08/20/2015) 25 tablet 0  . pantoprazole (PROTONIX) 40 MG tablet Take 40 mg by mouth at bedtime.     . potassium chloride (K-DUR,KLOR-CON) 10 MEQ tablet Take 3 tablets (30 mEq total) by mouth 2 (two) times daily. 360 tablet 3  . tamsulosin (FLOMAX) 0.4 MG CAPS capsule Take 0.4 mg by mouth at bedtime.     . torsemide (DEMADEX) 20 MG tablet Take 3 tablets (60 mg total) by mouth 2 (two) times daily. 360 tablet 3   No current facility-administered medications for this visit.    Allergies  Allergen Reactions  . Omnipaque [Iohexol] Other (See Comments)    Decreased kidney function  . Primaxin [Imipenem] Hives  . Cephalosporins Rash  Blisters  . Clindamycin/Lincomycin Swelling and Rash    Review of Systems negative except from HPI and PMH  Physical Exam BP 102/62 mmHg  Pulse 89  Ht 5' 6.5" (1.689 m)  Wt 232 lb (105.235 kg)  BMI 36.89 kg/m2 Well developed and well nourished in no acute distress HENT normal E scleral and icterus clear Neck Supple JVP9; carotids brisk and full Clear to ausculation Device pocket well healed; without hematoma or erythema.  There is no tethering Regular rate and rhythm, no murmurs gallops or rub Soft with active bowel sounds No clubbing cyanosis tr Edema Alert and oriented, grossly normal motor and sensory function Skin Warm and Dry  ECG demonstrates P synchronous pacing  Assessment and  Plan  High-grade heart block now device dependent  Obesity  Obstructive sleep apnea  Cardiomyopathy progressive question pacemaker  Congestive heart failure-chronic-mixed  Sinus tachycardia  Pacemaker-[employer]tronic The patient's device was interrogated.  The information was reviewed. No changes were made in the programming.     Ischemic heart disease with prior bypass surgery     He is progressive left ventricular dysfunction. It is reasonable this point to consider RV apical pacing is contributing to the progressive  deterioration. CRT upgrade is appropriate. At the time of the procedure, it would be reasonable to place a high voltage lead also.  Question of timing however is begged by the concern about aortic stenosis and whether there may be a need for surgical revision. I will discuss this with Dr. DM. The event that that were the case with suggest that we place an LV epicardial lead at that time as her some possibility that surgical repair of his aortic stenosis with associated with improved LV function  I've also discussed with Dr. DM the role of Ivabradine; we will defer this initiation for right now   We spent more than 50% of our >25 min visit in face to face counseling regarding the above

## 2015-09-22 NOTE — Plan of Care (Signed)
Problem: Education: Goal: Knowledge of [facility] Education information/materials will improve Outcome: Progressing Education given by admission RN

## 2015-09-22 NOTE — Progress Notes (Signed)
Patient refused to take hospital medications due to Medicaid not paying for them during his last visit. Patient states he brought his own medication and will be taking them. I asked for the medication for pharmacy to verify them. He states they are "loose" and not in the prescription bottles. I checked with pharmacy and they said they are unable to verify medications that are not in the prescription bottles due to it being unsafe.  Patient and family educated that it is against hospital policy for him to take home medication that is not verified. Patient states he will still be "refusing" our medication while here.

## 2015-09-22 NOTE — Progress Notes (Signed)
Education provided to patient, daughters and wife about patient not being able to take unverified home medication . We request the wife bring pill bottles but she said she lives an hour away and the patient will be discharged tomorrow so that is not going to happen. Patient yells and states "this is about money and he doesn't care what we have to say". Family is supportive of hospital decisions however patient is upset.

## 2015-09-23 ENCOUNTER — Encounter (HOSPITAL_COMMUNITY): Payer: Self-pay | Admitting: Internal Medicine

## 2015-09-23 ENCOUNTER — Ambulatory Visit (HOSPITAL_COMMUNITY): Payer: Medicare Other

## 2015-09-23 ENCOUNTER — Ambulatory Visit: Payer: Medicare Other | Admitting: Pulmonary Disease

## 2015-09-23 DIAGNOSIS — I255 Ischemic cardiomyopathy: Secondary | ICD-10-CM | POA: Diagnosis not present

## 2015-09-23 DIAGNOSIS — I442 Atrioventricular block, complete: Secondary | ICD-10-CM | POA: Diagnosis not present

## 2015-09-23 DIAGNOSIS — I130 Hypertensive heart and chronic kidney disease with heart failure and stage 1 through stage 4 chronic kidney disease, or unspecified chronic kidney disease: Secondary | ICD-10-CM | POA: Diagnosis not present

## 2015-09-23 DIAGNOSIS — E1122 Type 2 diabetes mellitus with diabetic chronic kidney disease: Secondary | ICD-10-CM | POA: Diagnosis not present

## 2015-09-23 LAB — GLUCOSE, CAPILLARY: Glucose-Capillary: 326 mg/dL — ABNORMAL HIGH (ref 65–99)

## 2015-09-23 MED ORDER — INSULIN ASPART 100 UNIT/ML ~~LOC~~ SOLN
0.0000 [IU] | Freq: Three times a day (TID) | SUBCUTANEOUS | Status: DC
  Administered 2015-09-23: 09:00:00 11 [IU] via SUBCUTANEOUS

## 2015-09-23 MED ORDER — POTASSIUM CHLORIDE CRYS ER 10 MEQ PO TBCR: 20.0000 meq | EXTENDED_RELEASE_TABLET | Freq: Every day | ORAL | Status: DC

## 2015-09-23 MED ORDER — MUPIROCIN 2 % EX OINT: 1.0000 "application " | TOPICAL_OINTMENT | Freq: Two times a day (BID) | CUTANEOUS | Status: DC

## 2015-09-23 MED ORDER — MUPIROCIN 2 % EX OINT: 1.0000 "application " | TOPICAL_OINTMENT | Freq: Two times a day (BID) | CUTANEOUS | Status: AC

## 2015-09-23 MED ORDER — IPRATROPIUM-ALBUTEROL 0.5-2.5 (3) MG/3ML IN SOLN
3.0000 mL | Freq: Two times a day (BID) | RESPIRATORY_TRACT | Status: DC
  Administered 2015-09-23: 3 mL via RESPIRATORY_TRACT
  Filled 2015-09-23: qty 3

## 2015-09-23 MED ORDER — BISOPROLOL FUMARATE 5 MG PO TABS: 2.5000 mg | ORAL_TABLET | Freq: Every day | ORAL | Status: DC

## 2015-09-23 MED ORDER — CHLORHEXIDINE GLUCONATE CLOTH 2 % EX PADS
6.0000 | MEDICATED_PAD | Freq: Every day | CUTANEOUS | Status: DC
  Administered 2015-09-23: 07:00:00 6 via TOPICAL

## 2015-09-23 MED FILL — Heparin Sodium (Porcine) 2 Unit/ML in Sodium Chloride 0.9%: INTRAMUSCULAR | Qty: 500 | Status: AC

## 2015-09-23 NOTE — Discharge Instructions (Signed)
° ° °  Supplemental Discharge Instructions for  Pacemaker/Defibrillator Patients  Activity No heavy lifting or vigorous activity with your left/right arm for 6 to 8 weeks.  Do not raise your left/right arm above your head for one week.  Gradually raise your affected arm as drawn below.             09/27/15                     09/28/15                    09/29/15                  09/30/15 __  NO DRIVING for  1 week   ; you may begin driving on   [id number]  .  WOUND CARE - Keep the wound area clean and dry.  Do not get this area wet for 24 hours. No showers for 24 hours; you may shower on 09/24/15    . - The tape/steri-strips on your wound will fall off; do not pull them off.  No bandage is needed on the site.  DO  NOT apply any creams, oils, or ointments to the wound area. - If you notice any drainage or discharge from the wound, any swelling or bruising at the site, or you develop a fever > 101? F after you are discharged home, call the office at once.  Special Instructions - You are still able to use cellular telephones; use the ear opposite the side where you have your pacemaker/defibrillator.  Avoid carrying your cellular phone near your device. - When traveling through airports, show security personnel your identification card to avoid being screened in the metal detectors.  Ask the security personnel to use the hand wand. - Avoid arc welding equipment, MRI testing (magnetic resonance imaging), TENS units (transcutaneous nerve stimulators).  Call the office for questions about other devices. - Avoid electrical appliances that are in poor condition or are not properly grounded. - Microwave ovens are safe to be near or to operate.  Additional information for defibrillator patients should your device go off: - If your device goes off ONCE and you feel fine afterward, notify the device clinic nurses. - If your device goes off ONCE and you do not feel well afterward, call 911. - If your device goes  off TWICE, call 911. - If your device goes off THREE times in one day, call 911.  DO NOT DRIVE YOURSELF OR A FAMILY MEMBER WITH A DEFIBRILLATOR TO THE HOSPITAL--CALL 911.

## 2015-09-23 NOTE — Op Note (Signed)
NAMEKENYATA, [name]               ACCOUNT NO.:  [id number]  MEDICAL RECORD NO.:  [id number]  LOCATION:  [id number]                        FACILITY:  [facility]  PHYSICIAN:  Deboraha Sprang, MD, FACCDATE OF BIRTH:  [birth year]  DATE OF PROCEDURE:  09/22/2015 DATE OF DISCHARGE:                              OPERATIVE REPORT   PREOPERATIVE DIAGNOSES:  Complete heart block, congestive heart failure, ischemic cardiomyopathy.  POSTOPERATIVE DIAGNOSES:  Complete heart block, congestive heart failure, ischemic cardiomyopathy.  PROCEDURE:  Implantation of a left ventricular lead and device generator replacement.  PROCEDURE:  Following obtaining informed consent, the patient was brought to electrophysiology laboratory and placed on the fluoroscopic table in the supine position.  After routine prep and drape of the left upper chest, lidocaine was infiltrated along the line of the previous incision and carried down to the layer of the prepectoral fashion with care not to open up the device pocket.  We then obtained access to the extrathoracic left subclavian vein without difficulty using a micropuncture system, and then we were able to upsize placing a 9.5- French sheath through which we passed a St. Jude 135 CS cannulation sheath catheter.  The Wholey wire by itself cannulated the coronary sinus.  Interestingly, however, on flash venography, there were multiple tiny vein branches in which the sheath grounded itself.  We ultimately ended up at the trifurcation on the midlateral left and the first, which went cephalad and then anterior and posterior was not able to handle the lead.  The most anteriorly projected branch also would not allow [place] to pass a lead and venography had now suggested there was a stenosis.  We then shocked towards the third of these branches which was on the lower aspect of this branch, and we were able to pass a St. Jude 1456 lead, serial [id number] to a midpoint between the mid  and distal third.  In the 3-4 location, the LV sensed amplitude of the RV paced beat was 16.3, the impedance of 577, threshold of 0.8 at 0.5.  __________ configuration.  With these acceptable parameters recorded, the deployment system was removed in its entirety.  The lead was secured and then the pacemaker pocket was opened.  The pacemaker pocket had to be revised to house a much larger pacemaker pulse generator.  Hemostasis was obtained.  The pocket was copiously irrigated with antibiotic containing saline solution and the leads were then transferred to a St. Jude __________ device, serial [id number].  Through the device, the bipolar P-wave was 3.6 with a pace impedance of 430, threshold of 0.7 at 0.4, the RV impedance was 550 with a threshold of 0.5 at 0.4 and the LV impedance in the 2 CAN configuration which had a very narrow QRS was 710 with a pacing threshold of 1 V at 0.4 milliseconds.  The device was implanted.  The device was secured to the prepectoral fascia.  Surgicel was placed on the cephalad medial and lateral aspect of the pocket.  The wound was then closed in 2 layers in normal fashion.  The wound was washed dried and a benzoin Steri-Strip dressing was applied. Needle counts, sponge counts, and instrument counts were correct at the end  of the procedure according to the staff.     Deboraha Sprang, MD, [facility]     SCK/MEDQ  D:  09/22/2015  T:  09/22/2015  Job:  [id number]

## 2015-09-26 ENCOUNTER — Encounter: Payer: Self-pay | Admitting: Internal Medicine

## 2015-09-29 ENCOUNTER — Ambulatory Visit (INDEPENDENT_AMBULATORY_CARE_PROVIDER_SITE_OTHER): Payer: Medicare Other | Admitting: Pulmonary Disease

## 2015-09-29 ENCOUNTER — Ambulatory Visit (INDEPENDENT_AMBULATORY_CARE_PROVIDER_SITE_OTHER)
Admission: RE | Admit: 2015-09-29 | Discharge: 2015-09-29 | Disposition: A | Discharge status: Home / Self Care | Payer: Medicare Other | Source: Ambulatory Visit | Attending: Pulmonary Disease | Admitting: Pulmonary Disease

## 2015-09-29 ENCOUNTER — Observation Stay (HOSPITAL_COMMUNITY)
Admission: EM | Admit: 2015-09-29 | Discharge: 2015-10-01 | Disposition: A | Discharge status: Home / Self Care | Payer: Medicare Other | Attending: Internal Medicine | Admitting: Internal Medicine

## 2015-09-29 ENCOUNTER — Telehealth: Payer: Self-pay | Admitting: Internal Medicine

## 2015-09-29 ENCOUNTER — Ambulatory Visit (INDEPENDENT_AMBULATORY_CARE_PROVIDER_SITE_OTHER): Payer: Medicare Other | Admitting: *Deleted

## 2015-09-29 ENCOUNTER — Telehealth: Payer: Self-pay | Admitting: Student

## 2015-09-29 ENCOUNTER — Encounter (HOSPITAL_COMMUNITY): Payer: Self-pay | Admitting: Emergency Medicine

## 2015-09-29 ENCOUNTER — Encounter: Payer: Self-pay | Admitting: Internal Medicine

## 2015-09-29 ENCOUNTER — Encounter: Payer: Self-pay | Admitting: Pulmonary Disease

## 2015-09-29 ENCOUNTER — Emergency Department (HOSPITAL_COMMUNITY): Payer: Medicare Other

## 2015-09-29 VITALS — BP 118/78 | HR 72 | Temp 98.2°F | Ht 65.5 in | Wt 230.8 lb

## 2015-09-29 DIAGNOSIS — E1165 Type 2 diabetes mellitus with hyperglycemia: Secondary | ICD-10-CM

## 2015-09-29 DIAGNOSIS — Z950 Presence of cardiac pacemaker: Secondary | ICD-10-CM | POA: Insufficient documentation

## 2015-09-29 DIAGNOSIS — G4733 Obstructive sleep apnea (adult) (pediatric): Secondary | ICD-10-CM

## 2015-09-29 DIAGNOSIS — J982 Interstitial emphysema: Principal | ICD-10-CM | POA: Diagnosis present

## 2015-09-29 DIAGNOSIS — Z8249 Family history of ischemic heart disease and other diseases of the circulatory system: Secondary | ICD-10-CM | POA: Insufficient documentation

## 2015-09-29 DIAGNOSIS — I2510 Atherosclerotic heart disease of native coronary artery without angina pectoris: Secondary | ICD-10-CM | POA: Diagnosis not present

## 2015-09-29 DIAGNOSIS — I130 Hypertensive heart and chronic kidney disease with heart failure and stage 1 through stage 4 chronic kidney disease, or unspecified chronic kidney disease: Secondary | ICD-10-CM | POA: Insufficient documentation

## 2015-09-29 DIAGNOSIS — E785 Hyperlipidemia, unspecified: Secondary | ICD-10-CM | POA: Diagnosis not present

## 2015-09-29 DIAGNOSIS — Z8546 Personal history of malignant neoplasm of prostate: Secondary | ICD-10-CM | POA: Insufficient documentation

## 2015-09-29 DIAGNOSIS — N189 Chronic kidney disease, unspecified: Secondary | ICD-10-CM | POA: Insufficient documentation

## 2015-09-29 DIAGNOSIS — J939 Pneumothorax, unspecified: Secondary | ICD-10-CM

## 2015-09-29 DIAGNOSIS — E1159 Type 2 diabetes mellitus with other circulatory complications: Secondary | ICD-10-CM

## 2015-09-29 DIAGNOSIS — E119 Type 2 diabetes mellitus without complications: Secondary | ICD-10-CM | POA: Diagnosis not present

## 2015-09-29 DIAGNOSIS — I6523 Occlusion and stenosis of bilateral carotid arteries: Secondary | ICD-10-CM | POA: Diagnosis not present

## 2015-09-29 DIAGNOSIS — Z955 Presence of coronary angioplasty implant and graft: Secondary | ICD-10-CM | POA: Diagnosis not present

## 2015-09-29 DIAGNOSIS — N183 Chronic kidney disease, stage 3 unspecified: Secondary | ICD-10-CM | POA: Diagnosis present

## 2015-09-29 DIAGNOSIS — J449 Chronic obstructive pulmonary disease, unspecified: Secondary | ICD-10-CM

## 2015-09-29 DIAGNOSIS — I350 Nonrheumatic aortic (valve) stenosis: Secondary | ICD-10-CM

## 2015-09-29 DIAGNOSIS — I5022 Chronic systolic (congestive) heart failure: Secondary | ICD-10-CM | POA: Diagnosis not present

## 2015-09-29 DIAGNOSIS — Z953 Presence of xenogenic heart valve: Secondary | ICD-10-CM | POA: Diagnosis not present

## 2015-09-29 DIAGNOSIS — Z87891 Personal history of nicotine dependence: Secondary | ICD-10-CM | POA: Insufficient documentation

## 2015-09-29 LAB — CUP PACEART INCLINIC DEVICE CHECK
Battery Remaining Longevity: 63.6
Date Time Interrogation Session: 20170329172000
Implantable Lead Implant Date: 20160113
Implantable Lead Implant Date: 20170322
Implantable Lead Location: 753858
Implantable Lead Location: 753860
Lead Channel Impedance Value: 350 Ohm
Lead Channel Pacing Threshold Amplitude: 0.75 V
Lead Channel Pacing Threshold Amplitude: 0.75 V
Lead Channel Pacing Threshold Amplitude: 2.25 V
Lead Channel Pacing Threshold Pulse Width: 0.4 ms
Lead Channel Sensing Intrinsic Amplitude: 3.8 mV
Lead Channel Setting Pacing Pulse Width: 0.4 ms
Lead Channel Setting Sensing Sensitivity: 5 mV
MDC IDC LEAD IMPLANT DT: 20160113
MDC IDC LEAD LOCATION: 753859
MDC IDC MSMT BATTERY VOLTAGE: 3.01 V
MDC IDC MSMT LEADCHNL LV PACING THRESHOLD PULSEWIDTH: 0.4 ms
MDC IDC MSMT LEADCHNL RA IMPEDANCE VALUE: 450 Ohm
MDC IDC MSMT LEADCHNL RA PACING THRESHOLD PULSEWIDTH: 0.4 ms
MDC IDC MSMT LEADCHNL RV IMPEDANCE VALUE: 487.5 Ohm
MDC IDC MSMT LEADCHNL RV PACING THRESHOLD AMPLITUDE: 0.5 V
MDC IDC MSMT LEADCHNL RV PACING THRESHOLD AMPLITUDE: 0.5 V
MDC IDC MSMT LEADCHNL RV PACING THRESHOLD PULSEWIDTH: 0.4 ms
MDC IDC MSMT LEADCHNL RV PACING THRESHOLD PULSEWIDTH: 0.4 ms
MDC IDC SET LEADCHNL LV PACING AMPLITUDE: 3.25 V
MDC IDC SET LEADCHNL RA PACING AMPLITUDE: 1.625
MDC IDC SET LEADCHNL RV PACING AMPLITUDE: 2.5 V
MDC IDC SET LEADCHNL RV PACING PULSEWIDTH: 0.4 ms
MDC IDC STAT BRADY RA PERCENT PACED: 1.6 %
MDC IDC STAT BRADY RV PERCENT PACED: 99.52 %
Pulse Gen Model: 3262
Pulse Gen Serial Number: 7838612

## 2015-09-29 NOTE — Progress Notes (Signed)
Subjective:    Patient ID: [name], male    DOB: 1941/05/25, 75 y.o.   MRN: [id number]  HPI  75 yo ex-heavy smoker for FU of COPD & OSA. He smoked 2-3 PPD until he quit in 07/2012  He had prolonged hospitalization in 07/2012 with H1N1 influenza and pneumococcal PNA/ ARDS requiring tracheostomy. He also had a left empyema requiring chest tube, septic shock and AKI which resolved.   He had CABG-AVR (bioprosthetic valve). He was discharged on home O2.    09/29/2015  Chief Complaint  Patient presents with  . Follow-up    breathing doing better.  Not as much phlegm.  CAT score: 20;  Wearing CPAP machine every night.    42m FU He had BiV PPM placed  - dr Caryl Comes C/o increased swelling in pocket, no pain, palpitations or fevers  Patient wears CPAP every night for at least 6 hours. Says he feels rested the next day. Download 09/2015 shows good usage incl naps  Patient continues on budesonide nebulizer twice daily. & duonebs 3 times a day. He was given a trial of Breo in 2014 , but was unable to afford this inhaler.   Chest x-ray obtained stat-this suggests air in the mediastinum, pacemaker pocket difficult to delineate  Significant tests/ events  10/23/12 PFTs reveal severe obstructive / restrictive disease & severely reduced DLCO.   PFT 01/21/13 >FEV1 58%, ratio 58, +25% change after BD , DLCO 38 .    Past Medical History  Diagnosis Date  . Hypertension   . Hyperlipidemia   . Prediabetes   . CHF (congestive heart failure) ([place]) 07/2012; 10/17/2012  . COPD (chronic obstructive pulmonary disease) ([place])     a.  History of heavy smoking. PFTs (4/14) with mixed obstructive (COPD) and restrictive (post-ARDS) picture.   . Pneumonia 07/2012    PNA (H1N1 influenza + pneumococcus) in [id number] complicated by respiratory failure/ARDS. Required tracheostomy, now weaned off. Had left empyema requiring chest tube.   . Diabetes mellitus without complication ([place])   . History of blood  transfusion     "lots since January" (10/17/2012)  . Ischemic cardiomyopathy   . Anemia of chronic disease   . Aortic stenosis     Moderate to severe by echo in 4/14. Bioprosthetic #21 [facility] Ease aortic valve replacement in 4/14.   Marland Kitchen CAD (coronary artery disease)     a. 4/14 CABG: LIMA-LAD, seq SVG-ramus & OM1, SVG-PDA  . Carotid arterial disease (HCC)     Carotid dopplers (0000000) with A999333 LICA stenosis.   Marland Kitchen AAA (abdominal aortic aneurysm) ([place])     5/14 CT showed > 6 cm AAA, also right iliac aneurysm. Not stent graft candidate.   . Esophageal reflux   . Ischemic cardiomyopathy     a. 4/14 LHC: pLAD 95, ost ramus 70-80, mLCx 90, EF 30%  . Chronic systolic heart failure (HCC)     a. 1/14 ECHO: sev dil LV, EF30-35%, diff HK, mild MR, AS severe, AV grad 35 b. 8/14 ECHO: EF 55-60%, mild biopros AV sten mn grad. 25, RV mild dil, RA mild dil  . Complication of anesthesia     during last surgery had to be given special medicine b/c "something dropped" during surgery   . Anxiety     pt. admits that he has anxiety at times   . Shortness of breath     still goes deer hunting by himself  . OSA (obstructive sleep apnea)  on cpap- every sleep time.   . Chronic kidney disease     increased creatinine recently - being followed, near kidney failure fr. contrast dye   . Prostate cancer [facility])      s/p radiation treatment. - (PT. DENIES)Has indwelling foley.   . Arthritis     Review of Systems Patient denies significant dyspnea,cough, hemoptysis,  chest pain, palpitations, pedal edema, orthopnea, paroxysmal nocturnal dyspnea, lightheadedness, nausea, vomiting, abdominal or  leg pains      Objective:   Physical Exam  Gen. Pleasant, obese, in no distress ENT - no lesions, no post nasal drip Neck: No JVD, no thyromegaly, no carotid bruits Chest - PPM pocket, soft, nontender, fluctuant Lungs: no use of accessory muscles, no dullness to percussion, decreased without rales or rhonchi    Cardiovascular: Rhythm regular, heart sounds  normal, no murmurs or gallops, no peripheral edema Musculoskeletal: No deformities, no cyanosis or clubbing , no tremors       Assessment & Plan:

## 2015-09-29 NOTE — Assessment & Plan Note (Signed)
Ct CPAP Weight loss encouraged, compliance with goal of at least 4-6 hrs every night is the expectation. Advised against medications with sedative side effects Cautioned against driving when sleepy - understanding that sleepiness will vary on a day to day basis  

## 2015-09-29 NOTE — Telephone Encounter (Signed)
New Message  Pt c/o of swelling/puffiness on his device site- denies pain; Please call back and discuss.

## 2015-09-29 NOTE — Progress Notes (Signed)
Wound check appointment. Dermabond removed. Swelling noted around incision site- fluctuance over device, no firm borders. Wound without redness or drainage. Incision edges approximated, wound well healed. I have advised the patient and his wife to monitor wound size and to call if the swelling increases or if firmness is noted. Normal device function. Thresholds, sensing, and impedances consistent with implant measurements. LV threshold elevated from implant (increase by 1.25V- CSR ordered by Dr. Elsworth Soho- no evident change in lead position). Histogram distribution appropriate for patient and level of activity. No mode switches or high ventricular rates noted. Patient educated about wound care, arm mobility, lifting restrictions. ROV with SK 12/28/15. Received phone call from Dr. Caryl Comes after 5pm regarding CXR results and Dr. Bari Mantis concern with shadow noted on left lung. Plan to call patient and recommend that he follow up with MD on call tonight to discuss follow up CT.  Spoke with patient- he will call back once his wife is in the car to contact on call MD. Dr. Caryl Comes has spoken with Dr. Debara Pickett.

## 2015-09-29 NOTE — Assessment & Plan Note (Signed)
Ct pulmicort/duonebs CXR today for PPM pocket evaluation

## 2015-09-29 NOTE — ED Provider Notes (Signed)
CSN: [id number]     Arrival date & time 09/29/15  1806 History   First MD Initiated Contact with Patient 09/29/15 2048     Chief Complaint  Patient presents with  . Wound Check     Patient is a 75 y.o. male presenting with wound check. The history is provided by the patient.  Wound Check Pertinent negatives include no chest pain, no abdominal pain and no shortness of breath.  Patient was sent in for a CT scan. He had a pacemaker surgery recently in the hospital 1 week ago. This morning he had more swelling at the pacemaker site. No redness. Patient states he feels fine. Followed up with his pulmonologist and had a abnormal chest x-ray. Pulmonology discussed with cardiology and they sent in for CT scan of the chest. Patient states he feels fine. States there is more swelling at the pacemaker site. No real difficulty breathing. No real chest pain.  Past Medical History  Diagnosis Date  . Hypertension   . Hyperlipidemia   . Prediabetes   . CHF (congestive heart failure) ([place]) 07/2012; 10/17/2012  . COPD (chronic obstructive pulmonary disease) ([place])     a.  History of heavy smoking. PFTs (4/14) with mixed obstructive (COPD) and restrictive (post-ARDS) picture.   . Pneumonia 07/2012    PNA (H1N1 influenza + pneumococcus) in [id number] complicated by respiratory failure/ARDS. Required tracheostomy, now weaned off. Had left empyema requiring chest tube.   . Diabetes mellitus without complication ([place])   . History of blood transfusion     "lots since January" (10/17/2012)  . Ischemic cardiomyopathy   . Anemia of chronic disease   . Aortic stenosis     Moderate to severe by echo in 4/14. Bioprosthetic #21 [facility] Ease aortic valve replacement in 4/14.   Marland Kitchen CAD (coronary artery disease)     a. 4/14 CABG: LIMA-LAD, seq SVG-ramus & OM1, SVG-PDA  . Carotid arterial disease (HCC)     Carotid dopplers (0000000) with [id number] LICA stenosis.   Marland Kitchen AAA (abdominal aortic aneurysm) ([place])     5/14 CT showed > 6 cm  AAA, also right iliac aneurysm. Not stent graft candidate.   . Esophageal reflux   . Ischemic cardiomyopathy     a. 4/14 LHC: pLAD 95, ost ramus 70-80, mLCx 90, EF 30%  . Chronic systolic heart failure (HCC)     a. 1/14 ECHO: sev dil LV, EF30-35%, diff HK, mild MR, AS severe, AV grad 35 b. 8/14 ECHO: EF 55-60%, mild biopros AV sten mn grad. 25, RV mild dil, RA mild dil  . Complication of anesthesia     during last surgery had to be given special medicine b/c "something dropped" during surgery   . Anxiety     pt. admits that he has anxiety at times   . Shortness of breath     still goes deer hunting by himself  . OSA (obstructive sleep apnea)     on cpap- every sleep time.   . Chronic kidney disease     increased creatinine recently - being followed, near kidney failure fr. contrast dye   . Prostate cancer [facility])      s/p radiation treatment. - (PT. DENIES)Has indwelling foley.   . Arthritis    Past Surgical History  Procedure Laterality Date  . Transurethral microwave therapy  10/15/2012  . Nasal fracture surgery  1970's  . Tracheostomy  07/2012  . Tracheostomy closure  08/2012  . Anal fissure repair  2008  .  Coronary artery bypass graft N/A 10/25/2012    Procedure: CORONARY ARTERY BYPASS GRAFTING (CABG);  Surgeon: Melrose Nakayama, MD;  Location: [place];  Service: Open Heart Surgery;  Laterality: N/A;  times 4 using left internal mammary artery and endoscopically harvested bilateral saphenous vein   . Aortic valve replacement N/A 10/25/2012    Procedure: AORTIC VALVE REPLACEMENT (AVR);  Surgeon: Melrose Nakayama, MD;  Location: [place];  Service: Open Heart Surgery;  Laterality: N/A;  . Intraoperative transesophageal echocardiogram N/A 10/25/2012    Procedure: INTRAOPERATIVE TRANSESOPHAGEAL ECHOCARDIOGRAM;  Surgeon: Melrose Nakayama, MD;  Location: [place];  Service: Open Heart Surgery;  Laterality: N/A;  . Cardiac valve replacement    . Abdominal aortic aneurysm repair N/A  07/10/2013    Procedure: Resection and Graftiong of perirenal AAA; Insertion 14 x 8 Hemashield Graft Aorta to Left Common Iliac and to Right Common Femoral Artery With Ligastion of Right External and Interanl Iliac Artery;  Surgeon: Mal Misty, MD;  Location[facility] OR;  Service: Vascular;  Laterality: N/A;  . Left and right heart catheterization with coronary angiogram N/A 10/22/2012    Procedure: LEFT AND RIGHT HEART CATHETERIZATION WITH CORONARY ANGIOGRAM;  Surgeon: Larey Dresser, MD;  Location: [facility] CATH LAB;  Service: Cardiovascular;  Laterality: N/A;  . Permanent pacemaker insertion N/A 07/15/2014    Procedure: PERMANENT PACEMAKER INSERTION;  Surgeon: Deboraha Sprang, MD;  Location: [facility] CATH LAB;  Service: Cardiovascular;  Laterality: N/A;  . Pacemaker revision  09/22/2015    pacemaker upgrade   . Ep implantable device N/A 09/22/2015    Procedure: BiV Upgrade;  Surgeon: Deboraha Sprang, MD;  Location: [place] CV LAB;  Service: Cardiovascular;  Laterality: N/A;   Family History  Problem Relation Age of Onset  . Congestive Heart Failure    . Heart disease    . Cancer Mother   . Heart disease Father    Social History  Substance Use Topics  . Smoking status: Former Smoker -- 2.00 packs/day for 58 years    Types: Cigarettes    Quit date: 07/20/2012  . Smokeless tobacco: Never Used  . Alcohol Use: No     Comment: 10/17/2012 "years since I've had a drink; never had problem with it"    Review of Systems  Constitutional: Negative for appetite change.  HENT: Negative for facial swelling.   Respiratory: Negative for shortness of breath.   Cardiovascular: Negative for chest pain.  Gastrointestinal: Negative for abdominal pain.  Musculoskeletal: Negative for back pain.  Skin: Negative for wound.      Allergies  Omnipaque; Primaxin; Cephalosporins; and Clindamycin/lincomycin  Home Medications   Prior to Admission medications   Medication Sig Start Date End Date Taking? Authorizing  Provider  allopurinol (ZYLOPRIM) 100 MG tablet Take 100 mg by mouth at bedtime. Also take a 300 mg tablet during the day   Yes Historical Provider, MD  allopurinol (ZYLOPRIM) 300 MG tablet Take 300 mg by mouth every morning.    Yes Historical Provider, MD  amoxicillin (AMOXIL) 500 MG capsule Take 2,000 mg by mouth daily as needed (prior to dental procedures).    Yes Historical Provider, MD  aspirin EC 81 MG tablet Take 81 mg by mouth daily.   Yes Historical Provider, MD  atorvastatin (LIPITOR) 80 MG tablet TAKE 1 TABLET (80 MG TOTAL) BY MOUTH DAILY BEFORE BREAKFAST. 04/01/15  Yes Larey Dresser, MD  bisoprolol (ZEBETA) 5 MG tablet Take 0.5 tablets (2.5 mg total) by  mouth at bedtime. 09/23/15  Yes Renee Dyane Dustman, PA-C  budesonide (PULMICORT) 0.25 MG/2ML nebulizer solution INHALE 1 VIAL VIA NEBULIZER EVERY 12 HOURS Patient taking differently: Take 0.25 mg by nebulization 2 (two) times daily. INHALE 1 VIAL VIA NEBULIZER EVERY 12 HOURS 08/31/15  Yes Tammy S Parrett, NP  calcitRIOL (ROCALTROL) 0.25 MCG capsule Take 0.25 mcg by mouth daily.    Yes Historical Provider, MD  fexofenadine (ALLEGRA) 180 MG tablet Take 90 mg by mouth daily.    Yes Historical Provider, MD  ipratropium-albuterol (DUONEB) 0.5-2.5 (3) MG/3ML SOLN TAKE 3 MLS BY NEBULIZATION EVERY 4 (FOUR) HOURS AS NEEDED. Patient taking differently: Inhale 3 mLs into the lungs every 4 (four) hours as needed (shortness of breath).  09/22/15  Yes Rigoberto Noel, MD  ivabradine (CORLANOR) 5 MG TABS tablet Take 1 tablet (5 mg total) by mouth 2 (two) times daily with a meal. 09/03/15  Yes Larey Dresser, MD  JANUVIA 100 MG tablet Take 50 mg by mouth daily.  03/02/15  Yes Historical Provider, MD  NITROSTAT 0.4 MG SL tablet PLACE 1 TABLET UNDER TONGUE EVERY 5 MINUTES AS NEEDED FOR CHEST PAIN 10/29/14  Yes Jolaine Artist, MD  pantoprazole (PROTONIX) 40 MG tablet Take 40 mg by mouth at bedtime.  12/17/12  Yes Historical Provider, MD  potassium chloride  (K-DUR,KLOR-CON) 10 MEQ tablet Take 2 tablets (20 mEq total) by mouth daily. 09/23/15  Yes Renee Dyane Dustman, PA-C  tamsulosin (FLOMAX) 0.4 MG CAPS capsule Take 0.4 mg by mouth at bedtime.  03/04/13  Yes Historical Provider, MD  torsemide (DEMADEX) 20 MG tablet Take 2 tablets (40 mg total) by mouth 2 (two) times daily. 09/03/15  Yes Larey Dresser, MD   BP 117/82 mmHg  Pulse 71  Temp(Src) 98.2 F (36.8 C) (Oral)  Resp 16  SpO2 94% Physical Exam  Constitutional: He appears well-developed.  HENT:  Head: Atraumatic.  Neck: Neck supple.  Cardiovascular: Normal rate.   Pulmonary/Chest:  Some soft swelling around pacemaker site. Wound appears to be healing well. Decreased breath sounds anteriorly on left chest wall.   Musculoskeletal: Normal range of motion.  Neurological: He is alert.  Skin: Skin is warm.    ED Course  Procedures (including critical care time) Labs Review Labs Reviewed - No data to display  Imaging Review Dg Chest 2 View  09/29/2015  This is the back office imaging final text. See progress note for physician's narrative and interpretation.   Ct Chest Wo Contrast  09/29/2015  CLINICAL DATA:  Chronic shortness breath. Recent pacemaker replacement on 09/22/2015. Swelling near the pacemaker insertion site. Abnormal chest radiograph with hyperlucency in the left hemithorax of uncertain etiology. EXAM: CT CHEST WITHOUT CONTRAST TECHNIQUE: Multidetector CT imaging of the chest was performed following the standard protocol without IV contrast. COMPARISON:  Chest radiograph from earlier today. 08/11/2014 CT abdomen/pelvis. 08/11/2012 chest CT. FINDINGS: Mediastinum/Nodes: Normal heart size. No pericardial fluid/thickening. Three lead left subclavian pacemaker is noted with lead tips in the right atrium, right ventricular apex and coronary sinus. Aortic valve prosthesis is in place. Three-vessel coronary atherosclerosis status post CABG with left internal mammary and ascending aortic  bypass grafts. Atherosclerotic nonaneurysmal thoracic aorta. Normal caliber pulmonary arteries. There is a large collection of gas with thin internal septations at the interface of the mediastinum and left lung, which is new since the 08/11/2014 CT abdomen study, which demonstrates mass-effect on the left lung most prominently in the lingula with compressive atelectasis in  the lingula, and which appears to conform to the mediastinal contour, favoring large volume loculated left pneumomediastinum. Normal visualized thyroid. Normal esophagus. No pathologically enlarged axillary, mediastinal or gross hilar lymph nodes, noting limited sensitivity for the detection of hilar adenopathy on this noncontrast study. Lungs/Pleura: No pneumothorax. No pleural effusion. Mild-to-moderate centrilobular and paraseptal emphysema with diffuse bronchial wall thickening. Moderate compressive atelectasis in the left upper lobe, most prominent in the lingula. No acute consolidative airspace disease, significant pulmonary nodules or lung masses. Nonspecific subpleural reticulation in the lower lobes is unchanged. Upper abdomen: Tiny hiatal hernia . Musculoskeletal: No aggressive appearing focal osseous lesions. Mild degenerative changes in the thoracic spine. Sternotomy wires appear aligned and intact. Small to moderate amount of loculated subcutaneous emphysema at the anterior margin of the left chest wall pacemaker battery pack. No fluid collection. IMPRESSION: 1. Large collection of gas at the interface of the mediastinum and left lung, favored to represent a large loculated left pneumomediastinum given the moderate compressive atelectasis in the left upper lobe, and which is new since the most recent comparison study of 08/11/2014. No pneumothorax. 2. Small to moderate loculated subcutaneous emphysema at the anterior margin of the left chest wall pacemaker battery pack. 3. Mild-to-moderate centrilobular and paraseptal emphysema with  diffuse bronchial wall thickening, suggesting COPD. No acute consolidative airspace disease. 4. Three-vessel coronary atherosclerosis status post CABG. 5. Tiny hiatal hernia. Electronically Signed   By: Ilona Sorrel M.D.   On: 09/29/2015 21:58   I have personally reviewed and evaluated these images and lab results as part of my medical decision-making.   EKG Interpretation None      MDM   Final diagnoses:  Pneumomediastinum [facility])    Patient presents after abnormal chest x-ray after pacemaker placement. Found to have pneumomediastinum on CT it was ordered by cardiology. Will admit to cardiology. Patient is asymptomatic.     Davonna Belling, MD 09/30/15 [phone]

## 2015-09-29 NOTE — Patient Instructions (Signed)
CXR today Stay on CPAP

## 2015-09-29 NOTE — ED Notes (Addendum)
Pt here for CT scan for possible air embolism to pacemaker insertion site that is now swollen; pt sent to also r/o pneumothorax

## 2015-09-29 NOTE — ED Notes (Signed)
EDP at bedside  

## 2015-09-29 NOTE — Telephone Encounter (Signed)
The patient was seen in the office today for increased swelling around the site of his PPM. He had a CXR today which showed concern for a small pneumothorax.  The patient was contacted with these results and that he should report to his nearest ED for a Chest CT and further evaluation.   He reports [facility] is the closest ED and he is less than 20 minutes away. I called the [facility] Triage nurse and made her aware.  Signed, Erma Heritage, PA-C 09/29/2015, 6:02 PM Pager: [phone]

## 2015-09-29 NOTE — Telephone Encounter (Signed)
[name] reports a tennis-ball size firm area of swelling around new PPM. He denies fever chills, redness around incision or drainage from incision. He has an appt this afternoon in the area and will be able to come by the office afterwards for evaluation. Appt made for 3:30pm today on device clinic schedule- he is aware we are working him in and knows to be patient with [place].

## 2015-09-30 ENCOUNTER — Encounter (HOSPITAL_COMMUNITY): Payer: Self-pay | Admitting: *Deleted

## 2015-09-30 DIAGNOSIS — J982 Interstitial emphysema: Secondary | ICD-10-CM

## 2015-09-30 DIAGNOSIS — I350 Nonrheumatic aortic (valve) stenosis: Secondary | ICD-10-CM | POA: Diagnosis not present

## 2015-09-30 DIAGNOSIS — I5022 Chronic systolic (congestive) heart failure: Secondary | ICD-10-CM | POA: Diagnosis not present

## 2015-09-30 LAB — CBC
HCT: 42.9 % (ref 39.0–52.0)
Hemoglobin: 13.1 g/dL (ref 13.0–17.0)
MCH: 29.4 pg (ref 26.0–34.0)
MCHC: 30.5 g/dL (ref 30.0–36.0)
MCV: 96.2 fL (ref 78.0–100.0)
PLATELETS: 149 10*3/uL — AB (ref 150–400)
RBC: 4.46 MIL/uL (ref 4.22–5.81)
RDW: 18.3 % — AB (ref 11.5–15.5)
WBC: 7.4 10*3/uL (ref 4.0–10.5)

## 2015-09-30 LAB — GLUCOSE, CAPILLARY
GLUCOSE-CAPILLARY: 230 mg/dL — AB (ref 65–99)
GLUCOSE-CAPILLARY: 138 mg/dL — AB (ref 65–99)

## 2015-09-30 LAB — COMPREHENSIVE METABOLIC PANEL
ALBUMIN: 3.5 g/dL (ref 3.5–5.0)
ALK PHOS: 92 U/L (ref 38–126)
ALT: 33 U/L (ref 17–63)
ANION GAP: 10 (ref 5–15)
AST: 31 U/L (ref 15–41)
BILIRUBIN TOTAL: 1.3 mg/dL — AB (ref 0.3–1.2)
BUN: 30 mg/dL — ABNORMAL HIGH (ref 6–20)
CALCIUM: 9.5 mg/dL (ref 8.9–10.3)
CO2: 28 mmol/L (ref 22–32)
Chloride: 107 mmol/L (ref 101–111)
Creatinine, Ser: 1.49 mg/dL — ABNORMAL HIGH (ref 0.61–1.24)
GFR, EST AFRICAN AMERICAN: 52 mL/min — AB (ref >60)
GFR, EST NON AFRICAN AMERICAN: 45 mL/min — AB (ref >60)
Glucose, Bld: 115 mg/dL — ABNORMAL HIGH (ref 65–99)
POTASSIUM: 4.5 mmol/L (ref 3.5–5.1)
Sodium: 145 mmol/L (ref 135–145)
TOTAL PROTEIN: 6.2 g/dL — AB (ref 6.5–8.1)

## 2015-09-30 LAB — PROTIME-INR
INR: 0.94 (ref 0.00–1.49)
PROTHROMBIN TIME: 12.8 s (ref 11.6–15.2)

## 2015-09-30 MED ORDER — TORSEMIDE 20 MG PO TABS
40.0000 mg | ORAL_TABLET | Freq: Two times a day (BID) | ORAL | Status: DC
  Administered 2015-09-30: 40 mg via ORAL
  Filled 2015-09-30 (×2): qty 2

## 2015-09-30 MED ORDER — LORATADINE 10 MG PO TABS
10.0000 mg | ORAL_TABLET | Freq: Every day | ORAL | Status: DC
  Administered 2015-09-30 – 2015-10-01 (×2): 10 mg via ORAL
  Filled 2015-09-30 (×2): qty 1

## 2015-09-30 MED ORDER — ONDANSETRON HCL 4 MG/2ML IJ SOLN: 4.0000 mg | Freq: Four times a day (QID) | INTRAMUSCULAR | Status: DC | PRN

## 2015-09-30 MED ORDER — BUDESONIDE 0.25 MG/2ML IN SUSP
0.2500 mg | Freq: Two times a day (BID) | RESPIRATORY_TRACT | Status: DC
Start: 2015-09-30 — End: 2015-10-01
  Administered 2015-09-30 – 2015-10-01 (×2): 0.25 mg via RESPIRATORY_TRACT
  Filled 2015-09-30 (×3): qty 2

## 2015-09-30 MED ORDER — IPRATROPIUM-ALBUTEROL 0.5-2.5 (3) MG/3ML IN SOLN
3.0000 mL | RESPIRATORY_TRACT | Status: DC | PRN
  Administered 2015-09-30 – 2015-10-01 (×4): 3 mL via RESPIRATORY_TRACT
  Filled 2015-09-30 (×4): qty 3

## 2015-09-30 MED ORDER — ALLOPURINOL 100 MG PO TABS
100.0000 mg | ORAL_TABLET | Freq: Every day | ORAL | Status: DC
  Administered 2015-09-30: 100 mg via ORAL
  Filled 2015-09-30: qty 1

## 2015-09-30 MED ORDER — ALLOPURINOL 300 MG PO TABS
300.0000 mg | ORAL_TABLET | ORAL | Status: DC
  Administered 2015-09-30 – 2015-10-01 (×2): 300 mg via ORAL
  Filled 2015-09-30: qty 3
  Filled 2015-09-30: qty 1

## 2015-09-30 MED ORDER — TAMSULOSIN HCL 0.4 MG PO CAPS
0.4000 mg | ORAL_CAPSULE | Freq: Every day | ORAL | Status: DC
  Administered 2015-09-30: 0.4 mg via ORAL
  Filled 2015-09-30: qty 1

## 2015-09-30 MED ORDER — ATORVASTATIN CALCIUM 80 MG PO TABS
80.0000 mg | ORAL_TABLET | Freq: Every day | ORAL | Status: DC
  Administered 2015-09-30: 80 mg via ORAL
  Filled 2015-09-30: qty 1

## 2015-09-30 MED ORDER — BISOPROLOL FUMARATE 5 MG PO TABS
2.5000 mg | ORAL_TABLET | Freq: Every day | ORAL | Status: DC
  Administered 2015-09-30 (×2): 2.5 mg via ORAL
  Filled 2015-09-30: qty 0.5
  Filled 2015-09-30: qty 1

## 2015-09-30 MED ORDER — POTASSIUM CHLORIDE CRYS ER 20 MEQ PO TBCR
20.0000 meq | EXTENDED_RELEASE_TABLET | Freq: Every day | ORAL | Status: DC
  Administered 2015-09-30 – 2015-10-01 (×2): 20 meq via ORAL
  Filled 2015-09-30 (×2): qty 1

## 2015-09-30 MED ORDER — CALCITRIOL 0.25 MCG PO CAPS
0.2500 ug | ORAL_CAPSULE | Freq: Every day | ORAL | Status: DC
  Administered 2015-09-30 – 2015-10-01 (×2): 0.25 ug via ORAL
  Filled 2015-09-30 (×2): qty 1

## 2015-09-30 MED ORDER — TORSEMIDE 20 MG PO TABS
40.0000 mg | ORAL_TABLET | Freq: Two times a day (BID) | ORAL | Status: DC
  Administered 2015-09-30 – 2015-10-01 (×2): 40 mg via ORAL
  Filled 2015-09-30 (×3): qty 2

## 2015-09-30 MED ORDER — BUDESONIDE 0.25 MG/2ML IN SUSP
0.2500 mg | Freq: Two times a day (BID) | RESPIRATORY_TRACT | Status: DC
  Filled 2015-09-30: qty 2

## 2015-09-30 MED ORDER — IVABRADINE HCL 5 MG PO TABS
5.0000 mg | ORAL_TABLET | Freq: Two times a day (BID) | ORAL | Status: DC
  Administered 2015-09-30 – 2015-10-01 (×3): 5 mg via ORAL
  Filled 2015-09-30 (×5): qty 1

## 2015-09-30 MED ORDER — ACETAMINOPHEN 325 MG PO TABS: 650.0000 mg | ORAL_TABLET | ORAL | Status: DC | PRN

## 2015-09-30 MED ORDER — PANTOPRAZOLE SODIUM 40 MG PO TBEC
40.0000 mg | DELAYED_RELEASE_TABLET | Freq: Every day | ORAL | Status: DC
  Administered 2015-09-30 (×2): 40 mg via ORAL
  Filled 2015-09-30 (×2): qty 1

## 2015-09-30 MED ORDER — ASPIRIN EC 81 MG PO TBEC
81.0000 mg | DELAYED_RELEASE_TABLET | Freq: Every day | ORAL | Status: DC
  Administered 2015-09-30 – 2015-10-01 (×2): 81 mg via ORAL
  Filled 2015-09-30 (×2): qty 1

## 2015-09-30 MED ORDER — INSULIN ASPART 100 UNIT/ML ~~LOC~~ SOLN
0.0000 [IU] | Freq: Three times a day (TID) | SUBCUTANEOUS | Status: DC
  Administered 2015-09-30: 3 [IU] via SUBCUTANEOUS
  Administered 2015-10-01: 1 [IU] via SUBCUTANEOUS
  Administered 2015-10-01: 2 [IU] via SUBCUTANEOUS

## 2015-09-30 NOTE — ED Notes (Signed)
Called respiratory therapist as patient is requesting CPAP. He is also very upset that medications have not been given for night time, called Jeneen Rinks in pharmacy and asked to have all night time meds rescheduled. Patient acknowledges plan of care. Still frustrated as he does not have a room assignment.

## 2015-09-30 NOTE — ED Notes (Signed)
Pt being transported upstairs by trimaine, EMT

## 2015-09-30 NOTE — ED Notes (Signed)
Pt and family stated Surgeon said he could eat. Family verbalized understanding of the needed for NPO with surgery as an option. RN Hassan Rowan gave patient [place] sandwich.

## 2015-09-30 NOTE — Consult Note (Signed)
Reason for Consult:Pneumomediastinum Referring Physician: Dr. Isa Rankin is an 75 y.o. male.  HPI: Mr. Petro is a 75 year old gentleman on whom I did coronary bypass grafting 4 and aortic valve replacement with a bioprosthetic valve in 2014. He has an extensive past medical history including coronary disease, aortic stenosis, type 2 diabetes, chronic systolic heart failure, chronic kidney disease stage III, peripheral arterial disease, obstructive sleep apnea, and complete heart block. He had a pacemaker upgrade about a week ago. He was seen in the office yesterday due to some puffiness around the device. A chest x-ray showed concern for pneumothorax versus a pneumomediastinum. He was told to come to the emergency room. A CT of the chest was done, which showed the same finding and the radiologist interpreted as a pneumomediastinum. He was observed overnight. He denies any chest pain or shortness of breath. He does not have any difficulty swallowing. He thinks the subcutaneous emphysema around the pacemaker has decreased over the course of the evening.   Past Medical History  Diagnosis Date  . Hypertension   . Hyperlipidemia   . Prediabetes   . CHF (congestive heart failure) ([place]) 07/2012; 10/17/2012  . COPD (chronic obstructive pulmonary disease) ([place])     a.  History of heavy smoking. PFTs (4/14) with mixed obstructive (COPD) and restrictive (post-ARDS) picture.   . Pneumonia 07/2012    PNA (H1N1 influenza + pneumococcus) in 2/77 complicated by respiratory failure/ARDS. Required tracheostomy, now weaned off. Had left empyema requiring chest tube.   . Diabetes mellitus without complication ([place])   . History of blood transfusion     "lots since January" (10/17/2012)  . Ischemic cardiomyopathy   . Anemia of chronic disease   . Aortic stenosis     Moderate to severe by echo in 4/14. Bioprosthetic #21 [facility] Ease aortic valve replacement in 4/14.   Marland Kitchen CAD (coronary artery  disease)     a. 4/14 CABG: LIMA-LAD, seq SVG-ramus & OM1, SVG-PDA  . Carotid arterial disease (HCC)     Carotid dopplers (4/12) with 87-86% LICA stenosis.   Marland Kitchen AAA (abdominal aortic aneurysm) ([place])     5/14 CT showed > 6 cm AAA, also right iliac aneurysm. Not stent graft candidate.   . Esophageal reflux   . Ischemic cardiomyopathy     a. 4/14 LHC: pLAD 95, ost ramus 70-80, mLCx 90, EF 30%  . Chronic systolic heart failure (HCC)     a. 1/14 ECHO: sev dil LV, EF30-35%, diff HK, mild MR, AS severe, AV grad 35 b. 8/14 ECHO: EF 55-60%, mild biopros AV sten mn grad. 25, RV mild dil, RA mild dil  . Complication of anesthesia     during last surgery had to be given special medicine b/c "something dropped" during surgery   . Anxiety     pt. admits that he has anxiety at times   . Shortness of breath     still goes deer hunting by himself  . OSA (obstructive sleep apnea)     on cpap- every sleep time.   . Chronic kidney disease     increased creatinine recently - being followed, near kidney failure fr. contrast dye   . Prostate cancer [facility])      s/p radiation treatment. - (PT. DENIES)Has indwelling foley.   . Arthritis     Past Surgical History  Procedure Laterality Date  . Transurethral microwave therapy  10/15/2012  . Nasal fracture surgery  1970's  . Tracheostomy  07/2012  . Tracheostomy closure  08/2012  . Anal fissure repair  2008  . Coronary artery bypass graft N/A 10/25/2012    Procedure: CORONARY ARTERY BYPASS GRAFTING (CABG);  Surgeon: [place] Nakayama, MD;  Location: [place];  Service: Open Heart Surgery;  Laterality: N/A;  times 4 using left internal mammary artery and endoscopically harvested bilateral saphenous vein   . Aortic valve replacement N/A 10/25/2012    Procedure: AORTIC VALVE REPLACEMENT (AVR);  Surgeon: [place] Nakayama, MD;  Location: [place];  Service: Open Heart Surgery;  Laterality: N/A;  . Intraoperative transesophageal echocardiogram N/A 10/25/2012    Procedure:  INTRAOPERATIVE TRANSESOPHAGEAL ECHOCARDIOGRAM;  Surgeon: [place] Nakayama, MD;  Location: [place];  Service: Open Heart Surgery;  Laterality: N/A;  . Cardiac valve replacement    . Abdominal aortic aneurysm repair N/A 07/10/2013    Procedure: Resection and Graftiong of perirenal AAA; Insertion 14 x 8 Hemashield Graft Aorta to Left Common Iliac and to Right Common Femoral Artery With Ligastion of Right External and Interanl Iliac Artery;  Surgeon: Mal Misty, MD;  Location: [facility] OR;  Service: Vascular;  Laterality: N/A;  . Left and right heart catheterization with coronary angiogram N/A 10/22/2012    Procedure: LEFT AND RIGHT HEART CATHETERIZATION WITH CORONARY ANGIOGRAM;  Surgeon: Larey Dresser, MD;  Location: [facility] CATH LAB;  Service: Cardiovascular;  Laterality: N/A;  . Permanent pacemaker insertion N/A 07/15/2014    Procedure: PERMANENT PACEMAKER INSERTION;  Surgeon: Deboraha Sprang, MD;  Location: [facility] CATH LAB;  Service: Cardiovascular;  Laterality: N/A;  . Pacemaker revision  09/22/2015    pacemaker upgrade   . Ep implantable device N/A 09/22/2015    Procedure: BiV Upgrade;  Surgeon: Deboraha Sprang, MD;  Location: [place] CV LAB;  Service: Cardiovascular;  Laterality: N/A;    Family History  Problem Relation Age of Onset  . Congestive Heart Failure    . Heart disease    . Cancer Mother   . Heart disease Father     Social History:  reports that he quit smoking about 3 years ago. His smoking use included Cigarettes. He has a 116 pack-year smoking history. He has never used smokeless tobacco. He reports that he does not drink alcohol or use illicit drugs.  Allergies:  Allergies  Allergen Reactions  . Omnipaque [Iohexol] Other (See Comments)    Decreased kidney function  . Primaxin [Imipenem] Hives  . Cephalosporins Rash    Blisters  . Clindamycin/Lincomycin Swelling and Rash    Medications: Prior to Admission:  (Not in a hospital admission)  Results for orders placed or  performed during the hospital encounter of 09/29/15 (from the past 48 hour(s))  CBC     Status: Abnormal   Collection Time: 09/30/15  2:29 AM  Result Value Ref Range   WBC 7.4 4.0 - 10.5 K/uL   RBC 4.46 4.22 - 5.81 MIL/uL   Hemoglobin 13.1 13.0 - 17.0 g/dL   HCT 42.9 39.0 - 52.0 %   MCV 96.2 78.0 - 100.0 fL   MCH 29.4 26.0 - 34.0 pg   MCHC 30.5 30.0 - 36.0 g/dL   RDW 18.3 (H) 11.5 - 15.5 %   Platelets 149 (L) 150 - 400 K/uL  Protime-INR     Status: None   Collection Time: 09/30/15  2:29 AM  Result Value Ref Range   Prothrombin Time 12.8 11.6 - 15.2 seconds   INR 0.94 0.00 - 1.49  Comprehensive metabolic panel  Status: Abnormal   Collection Time: 09/30/15  2:29 AM  Result Value Ref Range   Sodium 145 135 - 145 mmol/L   Potassium 4.5 3.5 - 5.1 mmol/L   Chloride 107 101 - 111 mmol/L   CO2 28 22 - 32 mmol/L   Glucose, Bld 115 (H) 65 - 99 mg/dL   BUN 30 (H) 6 - 20 mg/dL   Creatinine, Ser 1.49 (H) 0.61 - 1.24 mg/dL   Calcium 9.5 8.9 - 10.3 mg/dL   Total Protein 6.2 (L) 6.5 - 8.1 g/dL   Albumin 3.5 3.5 - 5.0 g/dL   AST 31 15 - 41 U/L   ALT 33 17 - 63 U/L   Alkaline Phosphatase 92 38 - 126 U/L   Total Bilirubin 1.3 (H) 0.3 - 1.2 mg/dL   GFR calc non Af Amer 45 (L) >60 mL/min   GFR calc Af Amer 52 (L) >60 mL/min    Comment: (NOTE) The eGFR has been calculated using the CKD EPI equation. This calculation has not been validated in all clinical situations. eGFR's persistently <60 mL/min signify possible Chronic Kidney Disease.    Anion gap 10 5 - 15    Dg Chest 2 View  09/29/2015  This is the back office imaging final text. See progress note for physician's narrative and interpretation.   Ct Chest Wo Contrast  09/29/2015  CLINICAL DATA:  Chronic shortness breath. Recent pacemaker replacement on 09/22/2015. Swelling near the pacemaker insertion site. Abnormal chest radiograph with hyperlucency in the left hemithorax of uncertain etiology. EXAM: CT CHEST WITHOUT CONTRAST  TECHNIQUE: Multidetector CT imaging of the chest was performed following the standard protocol without IV contrast. COMPARISON:  Chest radiograph from earlier today. 08/11/2014 CT abdomen/pelvis. 08/11/2012 chest CT. FINDINGS: Mediastinum/Nodes: Normal heart size. No pericardial fluid/thickening. Three lead left subclavian pacemaker is noted with lead tips in the right atrium, right ventricular apex and coronary sinus. Aortic valve prosthesis is in place. Three-vessel coronary atherosclerosis status post CABG with left internal mammary and ascending aortic bypass grafts. Atherosclerotic nonaneurysmal thoracic aorta. Normal caliber pulmonary arteries. There is a large collection of gas with thin internal septations at the interface of the mediastinum and left lung, which is new since the 08/11/2014 CT abdomen study, which demonstrates mass-effect on the left lung most prominently in the lingula with compressive atelectasis in the lingula, and which appears to conform to the mediastinal contour, favoring large volume loculated left pneumomediastinum. Normal visualized thyroid. Normal esophagus. No pathologically enlarged axillary, mediastinal or gross hilar lymph nodes, noting limited sensitivity for the detection of hilar adenopathy on this noncontrast study. Lungs/Pleura: No pneumothorax. No pleural effusion. Mild-to-moderate centrilobular and paraseptal emphysema with diffuse bronchial wall thickening. Moderate compressive atelectasis in the left upper lobe, most prominent in the lingula. No acute consolidative airspace disease, significant pulmonary nodules or lung masses. Nonspecific subpleural reticulation in the lower lobes is unchanged. Upper abdomen: Tiny hiatal hernia . Musculoskeletal: No aggressive appearing focal osseous lesions. Mild degenerative changes in the thoracic spine. Sternotomy wires appear aligned and intact. Small to moderate amount of loculated subcutaneous emphysema at the anterior margin  of the left chest wall pacemaker battery pack. No fluid collection. IMPRESSION: 1. Large collection of gas at the interface of the mediastinum and left lung, favored to represent a large loculated left pneumomediastinum given the moderate compressive atelectasis in the left upper lobe, and which is new since the most recent comparison study of 08/11/2014. No pneumothorax. 2. Small to moderate loculated subcutaneous emphysema  at the anterior margin of the left chest wall pacemaker battery pack. 3. Mild-to-moderate centrilobular and paraseptal emphysema with diffuse bronchial wall thickening, suggesting COPD. No acute consolidative airspace disease. 4. Three-vessel coronary atherosclerosis status post CABG. 5. Tiny hiatal hernia. Electronically Signed   By: Ilona Sorrel M.D.   On: 09/29/2015 21:58    ROS Blood pressure 102/87, pulse 74, temperature 97.7 F (36.5 C), temperature source Oral, resp. rate 18, SpO2 100 %. Physical Exam  Vitals reviewed. Constitutional:  Obese  HENT:  Head: Normocephalic and atraumatic.  Eyes: Conjunctivae and EOM are normal. No scleral icterus.  Neck: Neck supple. No tracheal deviation present. No thyromegaly present.  Cardiovascular: Normal rate and regular rhythm.   Murmur (2/6 systolic) heard. Respiratory: No respiratory distress. He has wheezes (Faint bilateral). He has no rales.  Subcutaneous air around pacemaker  GI: Soft. He exhibits no distension. There is no tenderness.  Musculoskeletal: He exhibits no edema.  Lymphadenopathy:    He has no cervical adenopathy.  Neurological: He is alert. No cranial nerve deficit.  Skin: Skin is warm and dry.    Assessment/Plan: 75 year old man with multiple medical problems who has a spontaneous pneumomediastinum versus a contained pneumothorax. Radiology feels most likely a pneumomediastinum. In either event he is asymptomatic with stable vitals at present time and the appropriate management is observation.  He has  already noted some decrease in the amount of subcutaneous air in the pacemaker pocket. I think it's very unlikely that he will need any type of tube placement, but would recommend observing him overnight and discharged tomorrow if he remains clinically stable.  [place] Nakayama 09/30/2015, 2:59 PM

## 2015-09-30 NOTE — ED Notes (Signed)
Admitting MD at the bedside.  

## 2015-09-30 NOTE — Assessment & Plan Note (Signed)
Chest x-ray suggests pneumomediastinum. I called Dr. Caryl Comes and discussed with him-patient has gone to cardiology for wound evaluation. He will need a CT chest urgently and Dr. Caryl Comes will schedule

## 2015-09-30 NOTE — ED Notes (Signed)
Paged MD about patient's request for Protonix.

## 2015-09-30 NOTE — H&P (Signed)
[name] is an 75 y.o. male.   Chief Complaint: abnormal chest x-ray Primary Cardiologist: Klein/McClean HPI: [name] is a 75 yo man with PMH of hypertension, COPD, chronic systolic heart failure, AAA, who had CRT-D upgrade ~ 1 week ago seen and noted to have increased swelling/fullness around device site and had chest x-ray concerning for PTX vs. Pneumomediastinum. He had subsequent CT scan revealing pneumomediastinum. He's been largely asymptomatic (no breathlessness, chest pain or worsening dyspnea with exertion). We are observing him overnight and will discuss possible intervention with CT surgery in AM. He has been compliant with his medications and denies infectious symptoms or change in diet or medication issues.   Past Medical History  Diagnosis Date  . Hypertension   . Hyperlipidemia   . Prediabetes   . CHF (congestive heart failure) ([place]) 07/2012; 10/17/2012  . COPD (chronic obstructive pulmonary disease) ([place])     a.  History of heavy smoking. PFTs (4/14) with mixed obstructive (COPD) and restrictive (post-ARDS) picture.   . Pneumonia 07/2012    PNA (H1N1 influenza + pneumococcus) in [id number] complicated by respiratory failure/ARDS. Required tracheostomy, now weaned off. Had left empyema requiring chest tube.   . Diabetes mellitus without complication ([place])   . History of blood transfusion     "lots since January" (10/17/2012)  . Ischemic cardiomyopathy   . Anemia of chronic disease   . Aortic stenosis     Moderate to severe by echo in 4/14. Bioprosthetic #21 [facility] Ease aortic valve replacement in 4/14.   Marland Kitchen CAD (coronary artery disease)     a. 4/14 CABG: LIMA-LAD, seq SVG-ramus & OM1, SVG-PDA  . Carotid arterial disease (HCC)     Carotid dopplers (0000000) with [id number] LICA stenosis.   Marland Kitchen AAA (abdominal aortic aneurysm) ([place])     5/14 CT showed > 6 cm AAA, also right iliac aneurysm. Not stent graft candidate.   . Esophageal reflux   . Ischemic cardiomyopathy     a. 4/14  LHC: pLAD 95, ost ramus 70-80, mLCx 90, EF 30%  . Chronic systolic heart failure (HCC)     a. 1/14 ECHO: sev dil LV, EF30-35%, diff HK, mild MR, AS severe, AV grad 35 b. 8/14 ECHO: EF 55-60%, mild biopros AV sten mn grad. 25, RV mild dil, RA mild dil  . Complication of anesthesia     during last surgery had to be given special medicine b/c "something dropped" during surgery   . Anxiety     pt. admits that he has anxiety at times   . Shortness of breath     still goes deer hunting by himself  . OSA (obstructive sleep apnea)     on cpap- every sleep time.   . Chronic kidney disease     increased creatinine recently - being followed, near kidney failure fr. contrast dye   . Prostate cancer [name])      s/p radiation treatment. - (PT. DENIES)Has indwelling foley.   . Arthritis     Past Surgical History  Procedure Laterality Date  . Transurethral microwave therapy  10/15/2012  . Nasal fracture surgery  1970's  . Tracheostomy  07/2012  . Tracheostomy closure  08/2012  . Anal fissure repair  2008  . Coronary artery bypass graft N/A 10/25/2012    Procedure: CORONARY ARTERY BYPASS GRAFTING (CABG);  Surgeon: Melrose Nakayama, MD;  Location: [place];  Service: Open Heart Surgery;  Laterality: N/A;  times 4 using left internal mammary artery  and endoscopically harvested bilateral saphenous vein   . Aortic valve replacement N/A 10/25/2012    Procedure: AORTIC VALVE REPLACEMENT (AVR);  Surgeon: Melrose Nakayama, MD;  Location: [place];  Service: Open Heart Surgery;  Laterality: N/A;  . Intraoperative transesophageal echocardiogram N/A 10/25/2012    Procedure: INTRAOPERATIVE TRANSESOPHAGEAL ECHOCARDIOGRAM;  Surgeon: Melrose Nakayama, MD;  Location: [place];  Service: Open Heart Surgery;  Laterality: N/A;  . Cardiac valve replacement    . Abdominal aortic aneurysm repair N/A 07/10/2013    Procedure: Resection and Graftiong of perirenal AAA; Insertion 14 x 8 Hemashield Graft Aorta to Left Common Iliac  and to Right Common Femoral Artery With Ligastion of Right External and Interanl Iliac Artery;  Surgeon: Mal Misty, MD;  Location: [facility];  Service: Vascular;  Laterality: N/A;  . Left and right heart catheterization with coronary angiogram N/A 10/22/2012    Procedure: LEFT AND RIGHT HEART CATHETERIZATION WITH CORONARY ANGIOGRAM;  Surgeon: Larey Dresser, MD;  Location: [facility] CATH LAB;  Service: Cardiovascular;  Laterality: N/A;  . Permanent pacemaker insertion N/A 07/15/2014    Procedure: PERMANENT PACEMAKER INSERTION;  Surgeon: Deboraha Sprang, MD;  Location: [facility] CATH LAB;  Service: Cardiovascular;  Laterality: N/A;  . Pacemaker revision  09/22/2015    pacemaker upgrade   . Ep implantable device N/A 09/22/2015    Procedure: BiV Upgrade;  Surgeon: Deboraha Sprang, MD;  Location: [place] CV LAB;  Service: Cardiovascular;  Laterality: N/A;    Family History  Problem Relation Age of Onset  . Congestive Heart Failure    . Heart disease    . Cancer Mother   . Heart disease Father    Social History:  reports that he quit smoking about 3 years ago. His smoking use included Cigarettes. He has a 116 pack-year smoking history. He has never used smokeless tobacco. He reports that he does not drink alcohol or use illicit drugs.  Allergies:  Allergies  Allergen Reactions  . Omnipaque [Iohexol] Other (See Comments)    Decreased kidney function  . Primaxin [Imipenem] Hives  . Cephalosporins Rash    Blisters  . Clindamycin/Lincomycin Swelling and Rash     (Not in a [facility] admission)  No results found for this or any previous visit (from the past 48 hour(s)). Dg Chest 2 View  09/29/2015  This is the back office imaging final text. See progress note for physician's narrative and interpretation.   Ct Chest Wo Contrast  09/29/2015  CLINICAL DATA:  Chronic shortness breath. Recent pacemaker replacement on 09/22/2015. Swelling near the pacemaker insertion site. Abnormal chest radiograph with  hyperlucency in the left hemithorax of uncertain etiology. EXAM: CT CHEST WITHOUT CONTRAST TECHNIQUE: Multidetector CT imaging of the chest was performed following the standard protocol without IV contrast. COMPARISON:  Chest radiograph from earlier today. 08/11/2014 CT abdomen/pelvis. 08/11/2012 chest CT. FINDINGS: Mediastinum/Nodes: Normal heart size. No pericardial fluid/thickening. Three lead left subclavian pacemaker is noted with lead tips in the right atrium, right ventricular apex and coronary sinus. Aortic valve prosthesis is in place. Three-vessel coronary atherosclerosis status post CABG with left internal mammary and ascending aortic bypass grafts. Atherosclerotic nonaneurysmal thoracic aorta. Normal caliber pulmonary arteries. There is a large collection of gas with thin internal septations at the interface of the mediastinum and left lung, which is new since the 08/11/2014 CT abdomen study, which demonstrates mass-effect on the left lung most prominently in the lingula with compressive atelectasis in the lingula, and which appears  to conform to the mediastinal contour, favoring large volume loculated left pneumomediastinum. Normal visualized thyroid. Normal esophagus. No pathologically enlarged axillary, mediastinal or gross hilar lymph nodes, noting limited sensitivity for the detection of hilar adenopathy on this noncontrast study. Lungs/Pleura: No pneumothorax. No pleural effusion. Mild-to-moderate centrilobular and paraseptal emphysema with diffuse bronchial wall thickening. Moderate compressive atelectasis in the left upper lobe, most prominent in the lingula. No acute consolidative airspace disease, significant pulmonary nodules or lung masses. Nonspecific subpleural reticulation in the lower lobes is unchanged. Upper abdomen: Tiny hiatal hernia . Musculoskeletal: No aggressive appearing focal osseous lesions. Mild degenerative changes in the thoracic spine. Sternotomy wires appear aligned and  intact. Small to moderate amount of loculated subcutaneous emphysema at the anterior margin of the left chest wall pacemaker battery pack. No fluid collection. IMPRESSION: 1. Large collection of gas at the interface of the mediastinum and left lung, favored to represent a large loculated left pneumomediastinum given the moderate compressive atelectasis in the left upper lobe, and which is new since the most recent comparison study of 08/11/2014. No pneumothorax. 2. Small to moderate loculated subcutaneous emphysema at the anterior margin of the left chest wall pacemaker battery pack. 3. Mild-to-moderate centrilobular and paraseptal emphysema with diffuse bronchial wall thickening, suggesting COPD. No acute consolidative airspace disease. 4. Three-vessel coronary atherosclerosis status post CABG. 5. Tiny hiatal hernia. Electronically Signed   By: Ilona Sorrel M.D.   On: 09/29/2015 21:58    Review of Systems  Constitutional: Negative for fever, chills, weight loss, malaise/fatigue and diaphoresis.  HENT: Negative for ear discharge, ear pain and tinnitus.   Eyes: Negative for photophobia, pain and discharge.  Respiratory: Negative for cough, sputum production and shortness of breath.   Cardiovascular: Negative for palpitations, orthopnea and claudication.       Left chest fullness over pocket site  Gastrointestinal: Negative for nausea, vomiting and abdominal pain.  Genitourinary: Negative for dysuria, urgency and frequency.  Skin: Negative for rash.  Neurological: Negative for tingling, tremors and sensory change.  Endo/Heme/Allergies: Negative for polydipsia. Bruises/bleeds easily.  Psychiatric/Behavioral: Negative for depression, suicidal ideas, hallucinations and substance abuse.    Blood pressure 117/82, pulse 71, temperature 98.2 F (36.8 C), temperature source Oral, resp. rate 16, SpO2 94 %. Physical Exam  Nursing note and vitals reviewed. Constitutional: He is oriented to person, place,  and time. He appears well-developed and well-nourished. No distress.  HENT:  Head: Normocephalic and atraumatic.  Nose: Nose normal.  Mouth/Throat: Oropharynx is clear and moist. No oropharyngeal exudate.  Eyes: Conjunctivae and EOM are normal. Pupils are equal, round, and reactive to light. No scleral icterus.  Neck: Normal range of motion. Neck supple. No JVD present. No tracheal deviation present.  Cardiovascular: Normal rate, regular rhythm and intact distal pulses.   Murmur heard. Respiratory: Effort normal. No respiratory distress. He has no wheezes. He has no rales.  Decreased BS over left mid lung  GI: Soft. Bowel sounds are normal. He exhibits no distension. There is no tenderness. There is no rebound.  Musculoskeletal: Normal range of motion. He exhibits no edema.  Neurological: He is alert and oriented to person, place, and time. No cranial nerve deficit. Coordination normal.  Skin: Skin is warm and dry. No rash noted. He is not diaphoretic. No erythema.  Psychiatric: He has a normal mood and affect. His behavior is normal. Judgment and thought content normal.   CT chest 1. Large collection of gas at the interface of the mediastinum and left lung,  favored to represent a large loculated left pneumomediastinum given the moderate compressive atelectasis in the left upper lobe, and which is new since the most recent comparison study of 08/11/2014. No pneumothorax. 2. Small to moderate loculated subcutaneous emphysema at the anterior margin of the left chest wall pacemaker battery pack. 3. Mild-to-moderate centrilobular and paraseptal emphysema with diffuse bronchial wall thickening, suggesting COPD. No acute consolidative airspace disease. 4. Three-vessel coronary atherosclerosis status post CABG. 5. Tiny hiatal hernia.  08/12/15 echo: EF 20-25%, mean AV gradient 24 Assessment/Plan Mr. Karpowicz is a 75 yo man with PMH of hypertension, COPD, chronic systolic heart failure, AAA, who  had CRT-D upgrade ~ 1 week ago seen and noted to have increased swelling/fullness around device site and had chest x-ray concerning for PTX vs. Pneumomediastinum. CT scan revealed pneumomediastinum. Device site with air in pocket. No infectious symptoms. Plan to observe overnight on telemetry, continue home medications (not on anticoagulation) and discuss with CT surgery in AM as well as EP.   Problem List 1. Pneumomediastinum s/p recent CRT-D upgrade 2. Chronic systolic heart failure 3. Moderate AV stenosis: ? Low flow/low gradient: plan as outpatient to perform dobutamine stress echo 4. COPD 5. T2DM 6. OSA on CPAP Plan 1. Continue home medications 2. Observation on telemetry 3. NPO for possible procedure in AM 4. Consult/discuss with CT surgery in AM unless symptoms change 5. Continue home CPAP Jules Husbands, MD 09/30/2015, 12:18 AM

## 2015-09-30 NOTE — Progress Notes (Signed)
SUBJECTIVE: The patient is doing well today.  At this time, he denies any chest pain, no shortness of breath.  . allopurinol  100 mg Oral QHS  . allopurinol  300 mg Oral BH-q7a  . aspirin EC  81 mg Oral Daily  . atorvastatin  80 mg Oral q1800  . bisoprolol  2.5 mg Oral QHS  . budesonide  0.25 mg Nebulization BID  . calcitRIOL  0.25 mcg Oral Daily  . ivabradine  5 mg Oral BID WC  . loratadine  10 mg Oral Daily  . pantoprazole  40 mg Oral QHS  . potassium chloride  20 mEq Oral Daily  . tamsulosin  0.4 mg Oral QHS  . torsemide  40 mg Oral BID      OBJECTIVE: Physical Exam: Filed Vitals:   09/30/15 0500 09/30/15 0650 09/30/15 0740 09/30/15 0800  BP: 98/68 123/74 122/70 119/68  Pulse: 85  73 74  Temp:      TempSrc:      Resp: 20 26 19 16   SpO2: 93%  94% 93%   No intake or output data in the 24 hours ending 09/30/15 0829  Telemetry reveals  SR, V pacing  GEN- The patient is obese, well appearing and in NAD, saturating 94% on RA,  alert and oriented x 3 today.   Head- normocephalic, atraumatic Eyes-  Sclera clear, conjunctiva pink Ears- hearing intact Oropharynx- clear Neck- supple, no JVP Lungs- decreased left upper/mid lung, no wheezes, rales, or crackles normal work of breathing Heart- Regular rate and rhythm, no significant murmurs, no rubs or gallops GI- soft, NT, ND Extremities- no clubbing, cyanosis, 1++ edema Skin- no rash or lesion Psych- euthymic mood, full affect Neuro- no gross deficits appreciated  LABS: Basic Metabolic Panel:  Recent Labs  09/30/15 0229  NA 145  K 4.5  CL 107  CO2 28  GLUCOSE 115*  BUN 30*  CREATININE 1.49*  CALCIUM 9.5   Liver Function Tests:  Recent Labs  09/30/15 0229  AST 31  ALT 33  ALKPHOS 92  BILITOT 1.3*  PROT 6.2*  ALBUMIN 3.5   CBC:  Recent Labs  09/30/15 0229  WBC 7.4  HGB 13.1  HCT 42.9  MCV 96.2  PLT 149*    RADIOLOGY: Dg Chest 2 View 09/29/2015  This is the back office imaging final  text. See progress note for physician's narrative and interpretation.  (Notes in Epic report findings concerning for pneumothorax, official reading not available)  Dg Chest 2 View 09/23/2015  CLINICAL DATA:  Status post pacemaker revision EXAM: CHEST  2 VIEW COMPARISON:  07/16/2014 FINDINGS: Cardiac shadow is stable. Postsurgical changes are noted. Pacing device is again seen with a new third lead identified. No pneumothorax is noted. Mild bibasilar atelectatic changes are seen. IMPRESSION: Bibasilar atelectatic changes. No pneumothorax following pacemaker revision. Electronically Signed   By: Inez Catalina M.D.   On: 09/23/2015 07:38   Ct Chest Wo Contrast 09/29/2015   IMPRESSION: 1. Large collection of gas at the interface of the mediastinum and left lung, favored to represent a large loculated left pneumomediastinum given the moderate compressive atelectasis in the left upper lobe, and which is new since the most recent comparison study of 08/11/2014. No pneumothorax. 2. Small to moderate loculated subcutaneous emphysema at the anterior margin of the left chest wall pacemaker battery pack. 3. Mild-to-moderate centrilobular and paraseptal emphysema with diffuse bronchial wall thickening, suggesting COPD. No acute consolidative airspace disease. 4. Three-vessel coronary atherosclerosis status  post CABG. 5. Tiny hiatal hernia. Electronically Signed   By: Ilona Sorrel M.D.   On: 09/29/2015 21:58   08/12/15: Echocardiogram Impressions: - When compared to the prior study from 04/13/2015 LVEF has further  deteriorated, now 20-25%. Aortic stenosis is moderate by  gradient, however with low flow most probably severe. If  clinically indicated consider a Dobutamine echocardiogram for  further evaluation.  DEVICE information: STJ CRT-P, implanted (updrade from dual chamber PPM) on 09/22/15 Dr. Caryl Comes  ASSESSMENT AND PLAN:   1. Pneumomediastinum      Patient denies any SOB outside of his baseline with  known COPD.       There was an incidental finding after a CXR done out patient to evaluate swelling at his PPM site at a planned visit with his pulmonologist     and was sent for further evaluation     I have spoken with Thurmond Butts with [facility] surgery their service Hanadi Stanly evaluate the patient, await their recommendations     VSS  2. s/p recent CRT-P upgrade on 09/22/15 with Dr. Caryl Comes, site swelling     No evidence of infection, soft, nontender  3. Chronic systolic heart failure, ICM     + edema, the patient reports at his baseline, no new symptoms  4. VHD      history of bioprosthetic AVR 2014     Mod AS  5. CAD     Remote CABG 2014     No CP  6. COPD     Hx of ARDS/trach 2014  7. T2DM  8. OSA on CPAP  Tommye Standard, PA-C 09/30/2015 8:29 AM   I have seen and examined this patient with Tommye Standard.  Agree with above, note added to reflect my findings.  On exam, regular rhythm, no murmurs, lungs clear.  Has possible PTX vs pneumediastinum on CT scan.  Mahala Rommel have CT surgery see him to duscuss further options.  Device works well without issues.  Continue to monitor.    Jovana Rembold M. Mirjana Tarleton MD 09/30/2015 2:07 PM

## 2015-09-30 NOTE — ED Notes (Signed)
Attempted Report x1.   

## 2015-10-01 ENCOUNTER — Observation Stay (HOSPITAL_COMMUNITY): Payer: Medicare Other

## 2015-10-01 ENCOUNTER — Telehealth: Payer: Self-pay | Admitting: Internal Medicine

## 2015-10-01 DIAGNOSIS — J982 Interstitial emphysema: Secondary | ICD-10-CM | POA: Diagnosis not present

## 2015-10-01 LAB — GLUCOSE, CAPILLARY
Glucose-Capillary: 147 mg/dL — ABNORMAL HIGH (ref 65–99)
Glucose-Capillary: 157 mg/dL — ABNORMAL HIGH (ref 65–99)

## 2015-10-01 NOTE — Care Management Obs Status (Signed)
[place] NOTIFICATION   Patient Details  Name: [name] MRN: [id number] Date of Birth: [birth year]   Medicare Observation Status Notification Given:  Yes    Royston Bake, RN 10/01/2015, 11:29 AM

## 2015-10-01 NOTE — Progress Notes (Signed)
  Subjective: No complaints, wants to go home Hasn't had CXR yet  Objective: Vital signs in last 24 hours: Temp:  [97 F (36.1 C)-98.2 F (36.8 C)] 97 F (36.1 C) (03/31 1318) Pulse Rate:  [63-97] 77 (03/31 1318) Cardiac Rhythm:  [-] Ventricular paced (03/31 1142) Resp:  [15-24] 18 (03/31 1318) BP: (102-115)/(54-87) 115/61 mmHg (03/31 1318) SpO2:  [90 %-100 %] 98 % (03/31 1318) Weight:  [226 lb 9.6 oz (102.785 kg)] 226 lb 9.6 oz (102.785 kg) (03/31 0548)  Hemodynamic parameters for last 24 hours:    Intake/Output from previous day:   Intake/Output this shift: Total I/O In: 340 [P.O.:340] Out: 600 [Urine:600]  General appearance: alert, cooperative and no distress Heart: regular rate and rhythm and murmur unchanged Lungs: diminished breath sounds bilaterally and equal  Lab Results:  Recent Labs  09/30/15 0229  WBC 7.4  HGB 13.1  HCT 42.9  PLT 149*   BMET:  Recent Labs  09/30/15 0229  NA 145  K 4.5  CL 107  CO2 28  GLUCOSE 115*  BUN 30*  CREATININE 1.49*  CALCIUM 9.5    PT/INR:  Recent Labs  09/30/15 0229  LABPROT 12.8  INR 0.94   ABG    Component Value Date/Time   PHART 7.381 07/15/2013 0433   HCO3 23.7 07/15/2013 0433   TCO2 25 07/15/2013 0433   ACIDBASEDEF 1.0 07/15/2013 0433   O2SAT 93.0 07/15/2013 0433   CBG (last 3)   Recent Labs  09/30/15 2301 10/01/15 0542 10/01/15 1211  GLUCAP 138* 147* 157*    Assessment/Plan: S/P   -  He is doing well clinically. OK for dc from my standpoint if CXR stable      Melrose Nakayama 10/01/2015

## 2015-10-01 NOTE — Discharge Summary (Signed)
DISCHARGE SUMMARY    Patient ID: [name],  MRN: [id number], DOB/AGE: [birth year] 75 y.o.  Admit date: 09/29/2015 Discharge date: 10/01/2015  Primary Care Physician: [name], MD Primary Cardiologist/Heart Failure: Dr. [name] Electrophysiologist: Dr. Caryl Comes Pulmonary: Dr. Elsworth Soho  Primary Discharge Diagnosis:  1. pneumomediastinum vs contained pneumothorax  Secondary Discharge Diagnosis:  1. CAD hx of CABG 2. 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 COPD 5. DM 6. OSA compliant with CPAP  Allergies  Allergen Reactions  . Omnipaque [Iohexol] Other (See Comments)    Decreased kidney function  . Primaxin [Imipenem] Hives  . Cephalosporins Rash    Blisters  . Clindamycin/Lincomycin Swelling and Rash     Procedures This Admission:  None.   Brief HPI: [name] is a 75 y.o. male was referred to the ER for out patient setting after a CXR suspected a pneumomediastinum.  Hospital Course:  The patient was admitted 09/30/15 with suspect pneumomediastinum by his pulmonologist.  The patient was at his office for a routinely scheduled visit when he mentioned swelling at his new PPM implant site.  A CXR was done to further evaluate this and an incidental finding noted.  In the interem of that visit, the patient was seen by the device clinic with a check on his device that showed normal function and his site intact.  He was called afterwards regarding the cxr finding and recommended to go to the ER for further evaluation.  At no time did the patient feel poorly, he has no new c/o of any SOB (with underlying COPD and some degree of baseline SOB that has remained unchanged), no CP, palpitations, no dizziness, near syncope or syncope.  No shocks from his device.  He had a CT upon arrival scan noting: IMPRESSION: 1. Large collection of gas at the interface of the mediastinum and left lung, favored to represent a large  loculated left pneumomediastinum given the moderate compressive atelectasis in the left upper lobe, and which is new since the most recent comparison study of 08/11/2014. No pneumothorax. 2. Small to moderate loculated subcutaneous emphysema at the anterior margin of the left chest wall pacemaker battery pack. 3. Mild-to-moderate centrilobular and paraseptal emphysema with diffuse bronchial wall thickening, suggesting COPD. No acute consolidative airspace disease. 4. Three-vessel coronary atherosclerosis status post CABG. 5. Tiny hiatal hernia.  CTS service was consulted and Dr. Roxan Hockey evaluated the patient felt either a pneumomediastinum vs contained pneumothorax with recommendations to pursue conservative observation given stability of the patient and no symptoms and if remained stable after observation overnight to discharge.   The patient remains feeling well without symptoms. His vitals have remained stable, O2 sats on RA 93-100%, afebrile.  His PPM site is stable.  Dr. Roxan Hockey has cleared the patient for discharge if f/u CXR is stable, this is pending.  Anticipiate discharge pending the result.  Physical Exam: Filed Vitals:   10/01/15 1112 10/01/15 1143 10/01/15 1151 10/01/15 1318  BP: 106/60   115/61  Pulse: 65   77  Temp: 97.7 F (36.5 C)   97 F (36.1 C)  TempSrc: Oral   Oral  Resp:    18  Weight:      SpO2: 95% 96% 96% 98%    Labs:   Lab Results  Component Value Date   WBC 7.4 09/30/2015   HGB 13.1 09/30/2015   HCT 42.9 09/30/2015   MCV 96.2  09/30/2015   PLT 149* 09/30/2015     Recent Labs Lab 09/30/15 0229  NA 145  K 4.5  CL 107  CO2 28  BUN 30*  CREATININE 1.49*  CALCIUM 9.5  PROT 6.2*  BILITOT 1.3*  ALKPHOS 92  ALT 33  AST 31  GLUCOSE 115*    Discharge Medications:    Medication List    TAKE these medications        allopurinol 300 MG tablet  Commonly known as:  ZYLOPRIM  Take 300 mg by mouth every morning.     allopurinol  100 MG tablet  Commonly known as:  ZYLOPRIM  Take 100 mg by mouth at bedtime. Also take a 300 mg tablet during the day     amoxicillin 500 MG capsule  Commonly known as:  AMOXIL  Take 2,000 mg by mouth daily as needed (prior to dental procedures).     aspirin EC 81 MG tablet  Take 81 mg by mouth daily.     atorvastatin 80 MG tablet  Commonly known as:  LIPITOR  TAKE 1 TABLET (80 MG TOTAL) BY MOUTH DAILY BEFORE BREAKFAST.     bisoprolol 5 MG tablet  Commonly known as:  ZEBETA  Take 0.5 tablets (2.5 mg total) by mouth at bedtime.     budesonide 0.25 MG/2ML nebulizer solution  Commonly known as:  PULMICORT  INHALE 1 VIAL VIA NEBULIZER EVERY 12 HOURS     calcitRIOL 0.25 MCG capsule  Commonly known as:  ROCALTROL  Take 0.25 mcg by mouth daily.     fexofenadine 180 MG tablet  Commonly known as:  ALLEGRA  Take 90 mg by mouth daily.     ipratropium-albuterol 0.5-2.5 (3) MG/3ML Soln  Commonly known as:  DUONEB  TAKE 3 MLS BY NEBULIZATION EVERY 4 (FOUR) HOURS AS NEEDED.     ivabradine 5 MG Tabs tablet  Commonly known as:  CORLANOR  Take 1 tablet (5 mg total) by mouth 2 (two) times daily with a meal.     JANUVIA 100 MG tablet  Generic drug:  sitaGLIPtin  Take 50 mg by mouth daily.     NITROSTAT 0.4 MG SL tablet  Generic drug:  nitroGLYCERIN  PLACE 1 TABLET UNDER TONGUE EVERY 5 MINUTES AS NEEDED FOR CHEST PAIN     pantoprazole 40 MG tablet  Commonly known as:  PROTONIX  Take 40 mg by mouth at bedtime.     potassium chloride 10 MEQ tablet  Commonly known as:  K-DUR,KLOR-CON  Take 2 tablets (20 mEq total) by mouth daily.     tamsulosin 0.4 MG Caps capsule  Commonly known as:  FLOMAX  Take 0.4 mg by mouth at bedtime.     torsemide 20 MG tablet  Commonly known as:  DEMADEX  Take 2 tablets (40 mg total) by mouth 2 (two) times daily.        Disposition: Home  Follow-up Information    Follow up with Virl Axe, MD.   Specialty:  Cardiology   Why:  An office  scheduleer [facility] be calling you to schedule a follow up appointment   Contact information:   [address] [place] [address] [phone]       Duration of Discharge Encounter: Greater than 30 minutes including physician time.  Venetia Night, PA-C 10/01/2015 2:59 PM  I have seen and examined this patient with [name].  Agree with above, note added to reflect my findings.  On exam, regular  rhythm, no murmurs, lungs clear.  Found to have pneumomediastinum vs. Pneumothorax on CXR and CT scan.  Seen by surgery who feel that watchful waiting approach is the safest.  Jamarien Rodkey plan to discharge today with follow up in surgery clinic for further work up.    Isatou Agredano M. Braedan Meuth MD 10/01/2015 3:42 PM

## 2015-10-01 NOTE — Progress Notes (Signed)
      [address]       [place] [address]             [phone]      Subjective:  [name] has no complaints.  States he is doing okay.  He thinks the pocket around his pacemaker is a little smaller.  Objective: Vital signs in last 24 hours: Temp:  [97.7 F (36.5 C)-98.2 F (36.8 C)] 97.7 F (36.5 C) (03/31 1112) Pulse Rate:  [63-97] 65 (03/31 1112) Cardiac Rhythm:  [-] Ventricular paced (03/31 1142) Resp:  [15-24] 20 (03/31 0548) BP: (102-115)/(54-96) 106/60 mmHg (03/31 1112) SpO2:  [90 %-100 %] 96 % (03/31 1151) Weight:  [226 lb 9.6 oz (102.785 kg)] 226 lb 9.6 oz (102.785 kg) (03/31 0548)  Intake/Output this shift: Total I/O In: 120 [P.O.:120] Out: 350 [Urine:350]  General appearance: alert, cooperative and no distress Heart: regular rate and rhythm Lungs: mildly diminished LUL Abdomen: soft, non-tender; bowel sounds normal; no masses,  no organomegaly Wound: clean, sub cutaneous air present  Lab Results:  Recent Labs  09/30/15 0229  WBC 7.4  HGB 13.1  HCT 42.9  PLT 149*   BMET:  Recent Labs  09/30/15 0229  NA 145  K 4.5  CL 107  CO2 28  GLUCOSE 115*  BUN 30*  CREATININE 1.49*  CALCIUM 9.5    PT/INR:  Recent Labs  09/30/15 0229  LABPROT 12.8  INR 0.94   ABG    Component Value Date/Time   PHART 7.381 07/15/2013 0433   HCO3 23.7 07/15/2013 0433   TCO2 25 07/15/2013 0433   ACIDBASEDEF 1.0 07/15/2013 0433   O2SAT 93.0 07/15/2013 0433   CBG (last 3)   Recent Labs  09/30/15 2301 10/01/15 0542 10/01/15 1211  GLUCAP 138* 147* 157*    Assessment/Plan:  1. Pneumomediastinum vs. Contained Pneumothorax- patient remains asymptomatic.  Will get CXR in stable or better, patient is safe to discharge from our standpoint      [name], [name] 10/01/2015

## 2015-10-01 NOTE — Progress Notes (Signed)
SUBJECTIVE: The patient is doing well today.  At this time, he denies chest pain, shortness of breath, or any new concerns.  Marland Kitchen allopurinol  100 mg Oral QHS  . allopurinol  300 mg Oral BH-q7a  . aspirin EC  81 mg Oral Daily  . atorvastatin  80 mg Oral q1800  . bisoprolol  2.5 mg Oral QHS  . budesonide  0.25 mg Nebulization BID  . calcitRIOL  0.25 mcg Oral Daily  . insulin aspart  0-9 Units Subcutaneous TID WC  . ivabradine  5 mg Oral BID WC  . loratadine  10 mg Oral Daily  . pantoprazole  40 mg Oral QHS  . potassium chloride  20 mEq Oral Daily  . tamsulosin  0.4 mg Oral QHS  . torsemide  40 mg Oral BID      OBJECTIVE: Physical Exam: Filed Vitals:   09/30/15 1500 09/30/15 2010 09/30/15 2100 10/01/15 0548  BP:   115/60 103/54  Pulse: 71  78 63  Temp:   97.8 F (36.6 C) 98.2 F (36.8 C)  TempSrc:   Oral Oral  Resp: 24  18 20   Weight:    226 lb 9.6 oz (102.785 kg)  SpO2: 93% 95% 96% 98%    Intake/Output Summary (Last 24 hours) at 10/01/15 0915 Last data filed at 10/01/15 0754  Gross per 24 hour  Intake      0 ml  Output    100 ml  Net   -100 ml    Telemetry reveals SR, V paced  GEN- The patient is obese, well appearing, alert and oriented x 3 today.   Head- normocephalic, atraumatic Eyes-  Sclera clear, conjunctiva pink Ears- hearing intact Oropharynx- clear Neck- supple, no JVP Lungs- diminished towards LUL, no absent BS, normal work of breathing Heart- Regular rate and rhythm, no significant murmurs, no rubs or gallops GI- soft, NT, ND Extremities- no clubbing, cyanosis, or edema Skin- no rash or lesion Psych- euthymic mood, full affect Neuro- no gross deficits appreciated  LABS: Basic Metabolic Panel:  Recent Labs  09/30/15 0229  NA 145  K 4.5  CL 107  CO2 28  GLUCOSE 115*  BUN 30*  CREATININE 1.49*  CALCIUM 9.5   Liver Function Tests:  Recent Labs  09/30/15 0229  AST 31  ALT 33  ALKPHOS 92  BILITOT 1.3*  PROT 6.2*  ALBUMIN 3.5     No results for input(s): LIPASE, AMYLASE in the last 72 hours. CBC:  Recent Labs  09/30/15 0229  WBC 7.4  HGB 13.1  HCT 42.9  MCV 96.2  PLT 149*      ASSESSMENT AND PLAN:  1. Pneumomediastinum vs contained pneumothorax per CTS  patient remains asymptomatic this mornig  VSS     Await final plan and disposition recommendations from CTS     2. s/p recent CRT-P upgrade on 09/22/15 with Dr. Caryl Comes, site swelling  No evidence of infection, soft, nontender  3. Chronic systolic heart failure, ICM  + edema, the patient reports at his baseline, no new symptoms  4. 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 CAD  Remote CABG 2014  No CP  6. COPD  Hx of ARDS/trach 2014  7. T2DM  8. OSA on CPAP  Tommye Standard, PA-C 10/01/2015 9:15 AM   I have seen and examined this patient with Tommye Standard.  Agree with above, note added to reflect my findings.  On exam, regular rhythm,  no murmurs, lung sounds decreased but clear.  Found to have pneumo mediastinum vs thorax on CT scan.  No symptoms currently.  Per surgery, watchful waiting approach.  Continue to follow along.    Oakley Kossman M. Genell Thede MD 10/01/2015 11:12 AM

## 2015-10-01 NOTE — Discharge Instructions (Signed)
Please continue your post pacemaker activity instructions/restrictions from your implant last week.

## 2015-10-01 NOTE — Progress Notes (Signed)
Pt. able to place self on CPAP for h/s use, aware to notify if needed, humidifier filled, no oxygen.

## 2015-10-01 NOTE — Telephone Encounter (Signed)
Initiated PA for [employer] via [place] will be faxed within 1-5 business days  Key: [facility]

## 2015-10-03 ENCOUNTER — Encounter: Payer: Self-pay | Admitting: Cardiology

## 2015-10-04 ENCOUNTER — Ambulatory Visit: Payer: Medicare Other

## 2015-10-04 NOTE — Telephone Encounter (Signed)
Received fax stating pt's Duoneb was denied under Medicare Part D because this is covered under Medicare Part B. Will call pharmacy on 10/05/2015 to have medication filed under Medicare Part B.

## 2015-10-08 ENCOUNTER — Telehealth (HOSPITAL_COMMUNITY): Payer: Self-pay | Admitting: Pharmacist

## 2015-10-08 NOTE — Telephone Encounter (Signed)
PA required for ivabradine approved by [employer] on 10/08/15, however will cost pt $206.86/mo. Pt was enrolled in [employer] with available balance of $1500 from 10/08/15-10/07/15. Information was relayed to pt's pharmacy ([facility]).  Billing ID: [id number] Person Code: [id number] RX Group: [id number] [place]: [id number] PCN for Part D: [id number]   Heloise Ochoa, Pharm.D., BCPS PGY2 Cardiology Pharmacy Resident Pager: [phone]

## 2015-10-17 ENCOUNTER — Ambulatory Visit (HOSPITAL_BASED_OUTPATIENT_CLINIC_OR_DEPARTMENT_OTHER): Payer: Medicare Other | Attending: Internal Medicine | Admitting: Radiology

## 2015-10-17 VITALS — Ht 66.5 in | Wt 231.0 lb

## 2015-10-17 DIAGNOSIS — R0683 Snoring: Secondary | ICD-10-CM | POA: Diagnosis not present

## 2015-10-17 DIAGNOSIS — G4733 Obstructive sleep apnea (adult) (pediatric): Secondary | ICD-10-CM | POA: Diagnosis not present

## 2015-10-17 DIAGNOSIS — G4736 Sleep related hypoventilation in conditions classified elsewhere: Secondary | ICD-10-CM | POA: Insufficient documentation

## 2015-10-17 DIAGNOSIS — Z79899 Other long term (current) drug therapy: Secondary | ICD-10-CM | POA: Insufficient documentation

## 2015-10-18 NOTE — Sleep Study (Signed)
Pt had a panic attack and went home.

## 2015-10-19 ENCOUNTER — Ambulatory Visit: Payer: Medicare Other | Admitting: Cardiology

## 2015-10-21 ENCOUNTER — Telehealth: Payer: Self-pay | Admitting: Cardiology

## 2015-10-21 ENCOUNTER — Encounter (HOSPITAL_COMMUNITY): Payer: Self-pay

## 2015-10-21 ENCOUNTER — Ambulatory Visit (HOSPITAL_COMMUNITY)
Admission: RE | Admit: 2015-10-21 | Discharge: 2015-10-21 | Disposition: A | Discharge status: Home / Self Care | Payer: Medicare Other | Source: Ambulatory Visit | Attending: Cardiology | Admitting: Cardiology

## 2015-10-21 VITALS — BP 124/88 | HR 69 | Wt 231.8 lb

## 2015-10-21 DIAGNOSIS — I951 Orthostatic hypotension: Secondary | ICD-10-CM | POA: Diagnosis not present

## 2015-10-21 DIAGNOSIS — N189 Chronic kidney disease, unspecified: Secondary | ICD-10-CM | POA: Insufficient documentation

## 2015-10-21 DIAGNOSIS — I714 Abdominal aortic aneurysm, without rupture: Secondary | ICD-10-CM | POA: Diagnosis not present

## 2015-10-21 DIAGNOSIS — Z951 Presence of aortocoronary bypass graft: Secondary | ICD-10-CM | POA: Diagnosis not present

## 2015-10-21 DIAGNOSIS — I6522 Occlusion and stenosis of left carotid artery: Secondary | ICD-10-CM | POA: Diagnosis not present

## 2015-10-21 DIAGNOSIS — Z950 Presence of cardiac pacemaker: Secondary | ICD-10-CM | POA: Insufficient documentation

## 2015-10-21 DIAGNOSIS — E785 Hyperlipidemia, unspecified: Secondary | ICD-10-CM | POA: Insufficient documentation

## 2015-10-21 DIAGNOSIS — I2510 Atherosclerotic heart disease of native coronary artery without angina pectoris: Secondary | ICD-10-CM | POA: Diagnosis not present

## 2015-10-21 DIAGNOSIS — M109 Gout, unspecified: Secondary | ICD-10-CM | POA: Diagnosis not present

## 2015-10-21 DIAGNOSIS — Z79899 Other long term (current) drug therapy: Secondary | ICD-10-CM | POA: Diagnosis not present

## 2015-10-21 DIAGNOSIS — I442 Atrioventricular block, complete: Secondary | ICD-10-CM | POA: Diagnosis not present

## 2015-10-21 DIAGNOSIS — I130 Hypertensive heart and chronic kidney disease with heart failure and stage 1 through stage 4 chronic kidney disease, or unspecified chronic kidney disease: Secondary | ICD-10-CM | POA: Diagnosis not present

## 2015-10-21 DIAGNOSIS — I5022 Chronic systolic (congestive) heart failure: Secondary | ICD-10-CM | POA: Insufficient documentation

## 2015-10-21 DIAGNOSIS — Z87891 Personal history of nicotine dependence: Secondary | ICD-10-CM | POA: Insufficient documentation

## 2015-10-21 DIAGNOSIS — Z7982 Long term (current) use of aspirin: Secondary | ICD-10-CM | POA: Insufficient documentation

## 2015-10-21 DIAGNOSIS — R0902 Hypoxemia: Secondary | ICD-10-CM | POA: Diagnosis not present

## 2015-10-21 DIAGNOSIS — Z91041 Radiographic dye allergy status: Secondary | ICD-10-CM | POA: Insufficient documentation

## 2015-10-21 DIAGNOSIS — I509 Heart failure, unspecified: Secondary | ICD-10-CM

## 2015-10-21 LAB — BASIC METABOLIC PANEL
Anion gap: 12 (ref 5–15)
BUN: 24 mg/dL — AB (ref 6–20)
CHLORIDE: 105 mmol/L (ref 101–111)
CO2: 25 mmol/L (ref 22–32)
CREATININE: 1.48 mg/dL — AB (ref 0.61–1.24)
Calcium: 9 mg/dL (ref 8.9–10.3)
GFR calc non Af Amer: 45 mL/min — ABNORMAL LOW (ref >60)
GFR, EST AFRICAN AMERICAN: 52 mL/min — AB (ref >60)
GLUCOSE: 152 mg/dL — AB (ref 65–99)
Potassium: 4 mmol/L (ref 3.5–5.1)
Sodium: 142 mmol/L (ref 135–145)

## 2015-10-21 LAB — BRAIN NATRIURETIC PEPTIDE: B Natriuretic Peptide: 124.7 pg/mL — ABNORMAL HIGH (ref 0.0–100.0)

## 2015-10-21 MED ORDER — SPIRONOLACTONE 25 MG PO TABS: 12.5000 mg | ORAL_TABLET | Freq: Every evening | ORAL | Status: DC

## 2015-10-21 NOTE — Sleep Study (Signed)
      Patient Name: [name], [name] MRN: [id number] Study Date: 10/17/2015 Gender: Male D.O.B: 01/26/1941 Age (years): 57 Referring Provider: Virl Pierce Interpreting Physician: Fransico Him MD, ABSM RPSGT: Zadie Rhine  Weight (lbs): 231 BMI: 37 Height (inches): 67 Neck Size: 18.00   CLINICAL INFORMATION Sleep Study Type: NPSG Indication for sleep study: OSA Epworth Sleepiness Score: 11  SLEEP STUDY TECHNIQUE As per the AASM Manual for the Scoring of Sleep and Associated Events v2.3 (April 2016) with a hypopnea requiring 4% desaturations. The channels recorded and monitored were frontal, central and occipital EEG, electrooculogram (EOG), submentalis EMG (chin), nasal and oral airflow, thoracic and abdominal wall motion, anterior tibialis EMG, snore microphone, electrocardiogram, and pulse oximetry.  MEDICATIONS Patient's medications include: Allopurinol, ASA. Lipitor, Zebeta, Pulmicort, Calcitriol, Allegra, Duoneb, Corlanor, Januvia, Protonix, potassium, Aldactone, Flomax, Demadex. Medications self-administered by patient during sleep study : No sleep medicine administered.  SLEEP ARCHITECTURE The study was initiated at 10:32:30 PM and ended at 4:41:49 AM. Sleep onset time was 10.1 minutes and the sleep efficiency was markedly reduced at 30.5%. The total sleep time was reduced at 112.5 minutes. Stage REM latency was N/A minutes. The patient spent 21.33% of the night in stage N1 sleep, 78.67% in stage N2 sleep, 0.00% in stage N3 and 0.00% in REM. Alpha intrusion was absent. Supine sleep was 16.45%.  RESPIRATORY PARAMETERS The overall apnea/hypopnea index (AHI) was 78.4 per hour. There were 77 total apneas, including 77 obstructive, 0 central and 0 mixed apneas. There were 70 hypopneas and 10 RERAs. The AHI during Stage REM sleep was N/A per hour. AHI while supine was 94.0 per hour. The mean oxygen saturation was 89.29%. The minimum SpO2 during sleep was 80.00%.  Total  time spent with oxygen saturations < 88% was 44 minutes.  Soft snoring was noted during this study.  CARDIAC DATA The 2 lead EKG demonstrated NSR with V paced rhythm. The mean heart rate was 55.21 beats per minute. Other EKG findings include: None.  LEG MOVEMENT DATA The total PLMS were 0 with a resulting PLMS index of 0.00. Associated arousal with leg movement index was 0.0 .  IMPRESSIONS - Severe obstructive sleep apnea occurred during this study (AHI = 78.4/h). - No significant central sleep apnea occurred during this study (CAI = 0.0/h). - Moderate oxygen desaturation was noted during this study (Min O2 = 80.00%).  Total time spent with oxygen saturations < 88% was 44 minutes.  - The patient snored with Soft snoring volume. - No cardiac abnormalities were noted during this study. - Clinically significant periodic limb movements did not occur during sleep. No significant associated arousals  DIAGNOSIS - Obstructive Sleep Apnea (327.23 [G47.33 ICD-10]) - Nocturnal Hypoxemia (327.26 [G47.36 ICD-10])  RECOMMENDATIONS - Therapeutic CPAP titration to determine optimal pressure required to alleviate sleep disordered breathing. - Positional therapy avoiding supine position during sleep. - Avoid alcohol, sedatives and other CNS depressants that may worsen sleep apnea and disrupt normal sleep architecture. - Sleep hygiene should be reviewed to assess factors that may improve sleep quality. - Weight management and regular exercise should be initiated or continued if appropriate.   Sueanne Margarita Diplomate, American Board of Sleep Medicine  ELECTRONICALLY SIGNED ON:  10/21/2015, 8:36 PM [place] PH: [phone]   FX: [phone] [place]

## 2015-10-21 NOTE — Telephone Encounter (Signed)
Please let patient know that they have sleep apnea and recommend CPAP titration. Please set up titration in the sleep lab. 

## 2015-10-21 NOTE — Patient Instructions (Signed)
Start Spironolactone 12.5 mg (1/2 tablet) every evening.  Your provider requests you have a sleep study.   Your provider has referred you to Cardiac Rehab at [facility] in [place], [place].  Routine lab work today. Will notify you of abnormal results  Your provider requests you have repeat lab work on 11/01/2015. Please have Results faxed to [phone]   Please Follow up with Dr.McLean in 2 months.

## 2015-10-21 NOTE — Progress Notes (Signed)
Advanced Heart Failure Medication Review by a Pharmacist  Does the patient  feel that his/her medications are working for him/her?  yes  Has the patient been experiencing any side effects to the medications prescribed?  no  Does the patient measure his/her own blood pressure or blood glucose at home?  yes   Does the patient have any problems obtaining medications due to transportation or finances?   no  Understanding of regimen: good Understanding of indications: good Potential of compliance: good Patient understands to avoid NSAIDs. Patient understands to avoid decongestants.  Issues to address at subsequent visits: None   Pharmacist comments:  [name] is a pleasant 75 yo M presenting with his wife and a current medication list. They report good compliance with his regimen but state he did have to discontinue lisinopril use 2/2 significant orthostatic hypotension. He did ask if he could take 400 mg of allopurinol all together instead of 300 mg qam and 100 mg qpm and I don't see any issues with this. He did not have any other medication-related questions or concerns for me at this time.   Ruta Hinds. Velva Harman, PharmD, BCPS, CPP Clinical Pharmacist Pager: [phone] Phone: [phone] 10/21/2015 12:10 PM      Time with patient: 16 minutes Preparation and documentation time: 2 minutes Total time: 18 minutes

## 2015-10-23 NOTE — Progress Notes (Signed)
Patient ID: [name], male   DOB: 1940-09-29, 75 y.o.   MRN: [id number]   Advanced Heart Failure Note   Patient ID: [name], male   DOB: 06/10/1941, 75 y.o.   MRN: [id number] PCP: Dr. [name] Nephrologist: Dr Deterding Vascular: Dr Kellie Simmering HF: [name]  75 yo with history of ARDS/respiratory failure and tracheostomy in 07/2012 as well as DM, HTN, and COPD presented to the ER at [facility] with CHF in 10/2012. Patient had a prolonged hospitalization in 07/2012 with H1N1 influenza and pneumococcal PNA. This progressed to ARDS. He was intubated and ended up with tracheostomy. He had the trach removed. He also had a left empyema requiring chest tube, septic shock with elevated troponin, and AKI which resolved.  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   He was admitted again in 10/2012 from his nursing home with exertional dyspnea and orthopnea. He had gained 21 lbs. Echo at admission showed EF 35% with global hypokinesis that looked worse in the anteroseptal wall. He also was noted to have aortic stenosis rated as moderate to severe. He was diuresed and cathed, showing severe 3 vessel disease. He then had CABG-AVR (bioprosthetic valve).   He had a large AAA and underwent surgical repair in 07/2013.  He had AKI and Pseudomonas PNA post-operatively.  He was discharged to a nursing home. Echo in 1/15 showed EF 55-60% with mildly dilated and dysfunctional RV and a bioprosthetic aortic valve with mean gradient 32 mmHg (higher than expected).   Repeat limited echo in 08/2013 showed shower aortic valve mean gradient of 21 mmHg.   In 11/15, patient had a holter monitor placed for bradycardia.  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  Baseline bifascicular block.  His HR also was noted to drop into the 40s with exercise, suggesting exercise-associated heart block.  He underwent Medtronic-PCM placement in 1/16 by Dr Caryl Comes.   Echo in 10/16 showed fall in EF to 30% with mean aortic  valve gradient 21 mmHg.  Repeat echo in 2/17 showed EF 20-25%, mean aortic valve gradient 24 mmHg with moderately decreased RV systolic function.  Cardiolite in 10/16 showed inferior scar with no ischemia.  He is RV pacing almost all the time.  Low dose DSE was done in 2/17 to assess for significant bioprosthetic valve stenosis => mean gradient with dobutamine was only 31 mmHg, so suspect no more than moderate stenosis.  He had St Jude CRT upgrade in 3/17.  He decided against ICD.   He returns HF follow up. He seems to be doing ok.  Weight is stable.  He is limited more by hip pain than by dyspnea.  He can walk short distances on flat ground with no problems.  He can climb a flight of steps without problems.  No chest pain.  Mild lightheadedness with rapid standing.  He had to stop lisinopril because he developed orthostasis.    ECG (10/01/15): NSR, BiV pacing  Labs (2/15): K 4, creatinine 1.6, BNP 166 Labs (3/15): Hgb 11.6, pro-BNP 1037, creatinine 1.76, BUN 30, LDL 75, HDL 26 Labs (8/15): K 3.9, creatinine 1.55, HCT 40.6, LDL 71, HDL 27 Labs (1/16): K 4.3, Creatinine 1.6, HCT 42.9 Labs (2/17): K 4.0 Creatinine 1.64, TSH normal.  Labs (3/17): K 4.5, creatinine 1.49, HCT 42.9  ROS: All systems negative except as listed in HPI, PMH and Problem List.  PMH: 1. PNA (H1N1 influenza + pneumococcus) in [id number] complicated by respiratory failure/ARDS. Required tracheostomy, now weaned off.  Had left empyema requiring chest tube.  Has developed post-infectious pulmonary fibrosis.  2. Prostate CA s/p radiation treatment. Has indwelling foley.  3. OSA: using CPAP 4. HTN  5. Hyperlipidemia  6. COPD: History of heavy smoking. PFTs (4/14) with mixed obstructive (COPD) and restrictive (post-ARDS) picture.  7. Type II diabetes  8. Anemia of chronic disease.  9. Ischemic cardiomyopathy:  - Echo (1/14) with severely dilated LV, EF 30-35%, diffuse hypokinesis worse in the anteroseptal wall, grade II diastolic  dysfunction, mild MR, AS interpreted as "moderate to severe" with aortic valve mean gradient 23 mmHg.  - Echo (4/14) witih EF 35-40%, mid to apical anteroseptal akinesis, moderate to severe AS with mean gradient 33 mmHg, AVA 0.91 cm^2.  - cho (5/14) post CABG showed EF 50%, anteroseptal hypokinesis, bioprosthetic aortic valve well-seated. - Echo (1/15) with EF 55-60%, mild LVH, mildly dilated RV with mildly decreased systolic function, D-shaped interventricular septum, bioprosthetic aortic valve with mean gardient 32 mmHg.   - Echo (3/15) with EF 55-60%, mild LVH, bioprosthetic aortic valve with mean gradient 21 mmHg (lower), no AI, RV moderately dilated with mild to moderately decreased systolic function.   - Echo (10/16) with EF 30%, bioprosthetic aortic valve with mean gradient 21 mmHg.   - Echo (2/17) with EF 20-25%, mild LVH, aortic valve mean gradient 24 mmHg/peak 39 mmHg, RV moderately dilated with moderately decreased systolic function.  - St Jude CRT upgrade in 3/17 (did not want ICD).  10. Aortic stenosis: Moderate to severe by echo in 4/14. Bioprosthetic #21 [facility] Ease aortic valve replacement in 4/14.  Low dose DSE (2/17) showed that Mr Fruin likely has no more than moderate bioprosthetic valve stenosis with mean gradient up to 31 mmHg with dobutamine.  11. CAD: LHC (4/14) with 95% pLAD, 70-80% ostial ramus, 70% mLCx, 90% pRCA, EF 35%.  - CABG 4/14 with LIMA-LAD, seq SVG-ramus and OM1, SVG-PDA.  - Cardiolite (10/16) with EF 42%, fixed inferior defect, no ischemia.   12. Carotid stenosis: Carotid dopplers (0000000) with A999333 LICA stenosis.  Carotids (123456) with 123456 LICA stenosis. Carotids (Q000111Q) with 123456 LICA stenosis.  13. AAA: 5/14 CT showed > 6 cm AAA, also right iliac aneurysm. Not stent graft candidate.  7/14 CT showed 7 cm AAA.  Surgical repair 1/15.  14. CKD: AKI in 7/14 from contrast nephropathy.  AKI in 1/15 after AAA repair.  15. Contrast allergy.  16. ABIs 9/14 were  normal.  17. Gout 18. 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  HR drops with exercise (likely exercise-induced heart block).  Medtronic dual chamber PPM placed in 2016.  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   SH: Quit smoking in 1/14, married, no ETOH, lives in [place].   FH: No premature CAD  ROS: All systems reviewed and negative except as per HPI.    Current Outpatient Prescriptions  Medication Sig Dispense Refill  . allopurinol (ZYLOPRIM) 100 MG tablet Take 100 mg by mouth at bedtime. Also take a 300 mg tablet during the day    . allopurinol (ZYLOPRIM) 300 MG tablet Take 300 mg by mouth every morning.     Marland Kitchen aspirin EC 81 MG tablet Take 81 mg by mouth daily.    Marland Kitchen atorvastatin (LIPITOR) 80 MG tablet TAKE 1 TABLET (80 MG TOTAL) BY MOUTH DAILY BEFORE BREAKFAST. 30 tablet 6  . bisoprolol (ZEBETA) 5 MG tablet Take 0.5 tablets (2.5 mg total) by mouth  at bedtime. 90 tablet 3  . budesonide (PULMICORT) 0.25 MG/2ML nebulizer solution Take 0.25 mg by nebulization 2 (two) times daily.    . calcitRIOL (ROCALTROL) 0.25 MCG capsule Take 0.25 mcg by mouth daily.     . fexofenadine (ALLEGRA) 180 MG tablet Take 90 mg by mouth daily.     Marland Kitchen ipratropium-albuterol (DUONEB) 0.5-2.5 (3) MG/3ML SOLN Take 3 mLs by nebulization every 4 (four) hours as needed (shortness of breath).    . ivabradine (CORLANOR) 5 MG TABS tablet Take 1 tablet (5 mg total) by mouth 2 (two) times daily with a meal. 60 tablet 3  . JANUVIA 100 MG tablet Take 50 mg by mouth daily.   6  . pantoprazole (PROTONIX) 40 MG tablet Take 40 mg by mouth at bedtime.     . potassium chloride (K-DUR,KLOR-CON) 10 MEQ tablet Take 2 tablets (20 mEq total) by mouth daily. 360 tablet 3  . tamsulosin (FLOMAX) 0.4 MG CAPS capsule Take 0.4 mg by mouth at bedtime.     . torsemide (DEMADEX) 20 MG tablet Take 2 tablets (40 mg total) by mouth 2 (two) times daily. 360 tablet 3  .  amoxicillin (AMOXIL) 500 MG capsule Take 2,000 mg by mouth daily as needed (prior to dental procedures). Reported on 10/21/2015    . NITROSTAT 0.4 MG SL tablet PLACE 1 TABLET UNDER TONGUE EVERY 5 MINUTES AS NEEDED FOR CHEST PAIN (Patient not taking: Reported on 10/21/2015) 25 tablet 0  . spironolactone (ALDACTONE) 25 MG tablet Take 0.5 tablets (12.5 mg total) by mouth every evening. 15 tablet 3   No current facility-administered medications for this encounter.    Filed Vitals:   10/21/15 1130  BP: 124/88  Pulse: 69  Weight: 231 lb 12 oz (105.121 kg)  SpO2: 97%    Wt Readings from Last 3 Encounters:  10/21/15 231 lb 12 oz (105.121 kg)  10/17/15 231 lb (104.781 kg)  10/01/15 226 lb 9.6 oz (102.785 kg)    PHYSICAL EXAM: General:  NAD, overweight, wife present HEENT: normal Neck: Thick, JVP 7 cm. Carotids 2+ bilaterally; no bruits. No thyromegaly or nodule noted.  Cor: PMI normal. RRR. No rubs, gallops. 2/6 early SEM RUSB.  Lungs: Decreased bilaterally Abdomen: Obese, NT, mildly distended, no HSM. No bruits or masses. +BS  Extremities: no cyanosis, clubbing, rash.  1+ edema 1/2 up lower legs bilaterally.  Neuro: alert & orientedx3, cranial nerves grossly intact. Moves all 4 extremities w/o difficulty. Affect pleasant.  ASSESSMENT & PLAN: 1. CAD: Status post CABG for 3VD.  Cardiolite in 10/16 with inferior scar, no ischemia.  No chest pain.  - Continue ASA, statin, and bisoprolol.  - I will set him up for cardiac rehab at the hospital in [place].  2. Chronic systolic CHF:  Most recent echo in 2/17 showed EF down to 20-25%.  No ischemia on Cardiolite.  Constant RV pacing may have led to decline in EF.  We considered bioprosthetic valve stenosis as cause of fall in EF, but low gradient DSE did not suggest severe stenosis.  NYHA class II-III, volume looks ok.  He is now s/p upgrade of pacemaker to CRT.  - Continue torsemide 40 mg twice a day.  - Lisinopril stopped due to orthostatic  hypotension.  - Continue bisoprolol 2.5 mg twice a day and ivabradine 5 mg bid.  - Add spironolactone 12.5 mg qhs.  BMET/BNP today and repeat in 10 days.  - Echo in 3-4 months to assess for improvement with CRT.  3. 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 Suspect that intrinsic lung disease (post-infectious pulmonary fibrosis) is a contributor to his dyspnea and hypoxemia. He is now off oxygen (just using CPAP at night).  4. Carotid stenosis: Followed at [facility]. 5. Hyperlipidemia: Continue statin. 6. AAA: s/p surgical repair. Stable on CT 08/11/14.  7. Status post bioprosthetic AVR: Gradient across the aortic valve has been elevated. There was some concern for possible low gradient severe bioprosthetic aortic stenosis based on most recent echo, but recent low dose DSE suggests that valvular stenosis is no more than moderate.   - He will need antibiotic prophylaxis with dental work.  8. CKD: BMET today. 9. 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  S/p Medtronic PCM 1/16.   Patient is pacemaker-dependent with complete heart block.  He had upgrade to CRT in 3/17.   10. OSA: Sleep study this month.   Loralie Champagne 10/23/2015

## 2015-10-24 ENCOUNTER — Other Ambulatory Visit: Payer: Self-pay | Admitting: Cardiology

## 2015-10-28 ENCOUNTER — Telehealth: Payer: Self-pay | Admitting: Pulmonary Disease

## 2015-10-28 NOTE — Telephone Encounter (Signed)
Per pt chart sleep study was completed today, 10/28/15.  LMTCB for [place].

## 2015-10-28 NOTE — Telephone Encounter (Signed)
Making sure our office saw sleep results and wanting RA to follow view and make changes to any sleep problems patient is having per sleep study results, no cb needed

## 2015-10-28 NOTE — Telephone Encounter (Signed)
Spoke with Romelle Starcher  She wants to make RA aware that there is an updated PSG on pt  It was ordered by cards  Please review and advise if there need to be any changes made in current treatment plan  Thanks

## 2015-10-28 NOTE — Telephone Encounter (Signed)
Patient is seen by pulmonology for OSA. And per patient and Dr. Bari Mantis office, they will be following.  Patient is aware that if he has questions regarding his CPAP treatment, he is to call pulmonary.

## 2015-10-29 ENCOUNTER — Encounter (HOSPITAL_COMMUNITY): Payer: Self-pay | Admitting: Cardiology

## 2015-10-29 NOTE — Progress Notes (Signed)
Referral for cardiac rehab signed by Dr [name] and faxed, along w/pt's records, to [facility]

## 2015-11-04 NOTE — Telephone Encounter (Signed)
RA please advise 

## 2015-11-06 ENCOUNTER — Encounter: Payer: Self-pay | Admitting: Pulmonary Disease

## 2015-11-09 NOTE — Telephone Encounter (Signed)
RA - please advise. Thanks! 

## 2015-11-11 ENCOUNTER — Telehealth: Payer: Self-pay | Admitting: Pulmonary Disease

## 2015-11-11 NOTE — Telephone Encounter (Signed)
LVM for pt to return call

## 2015-11-12 NOTE — Telephone Encounter (Signed)
Patient returned call, may speak with wife if he does not answer.

## 2015-11-12 NOTE — Telephone Encounter (Signed)
Attempted to contact pt. Went straight to voicemail which is full. Will try back.

## 2015-11-12 NOTE — Telephone Encounter (Signed)
Pt aware that message has been sent to Dr Elsworth Soho. We will call as soon as we hear back from RA.

## 2015-11-12 NOTE — Telephone Encounter (Signed)
Non-Urgent Medical Question     From   Rodman Key    To   Rigoberto Noel, MD    Sent   11/06/2015 10:51 AM       Dr Olin Pia nurse called me on 4/27  And said she was canceling  The appt. that Dr Oleh Genin office had made for a split night sleep study on June 11. And that dr.  Elsworth Soho would reschedule a sleep study for a cpap only study as they had already determined from my sleep study on 4/16 that I have sleep apnea.  Evidently this has fell through the cracks as I have heard nothing since 4/27 and the split night sleep study still shows on June 11 on My Chart. Could someone please check on this issue for me?        It looks like there is a message open from 10/28/15 that RA has not addressed. It's in regards to the same matter. lmtcb x1 for pt

## 2015-11-12 NOTE — Telephone Encounter (Signed)
Patient calling back regarding my chart message - prm

## 2015-11-16 NOTE — Telephone Encounter (Signed)
Duplicate phone note- see 11/11/15 phone note.

## 2015-11-16 NOTE — Telephone Encounter (Signed)
Sleep study printed and given to [facility] to give to RA, as I did not see his look-at folder in his cubby and he is here in clinic today.  RA please advise on sleep study-it looks like it was ordered by Dr. Claris Gladden office with the intent that you would be able to review results with pt.  Thanks.

## 2015-11-16 NOTE — Telephone Encounter (Signed)
I have not seen sleep study, who ordered it - was this read by dr turner? Pl ask sleep lab to place this in my box so I can see/ read it. Based on that, we can change the next study to CPAP ttiration

## 2015-11-17 ENCOUNTER — Telehealth: Payer: Self-pay | Admitting: Internal Medicine

## 2015-11-17 NOTE — Telephone Encounter (Signed)
Attempted to call pt. No answer and unable to leave a message.

## 2015-11-17 NOTE — Telephone Encounter (Signed)
Spoke w/ pt and informed him that he will not be billed for the remote transmissions that was received today. Pt verbalized understanding and stated that [employer] has ordered him a new monitor and he will have it in about 4 weeks.

## 2015-11-17 NOTE — Telephone Encounter (Signed)
New Message  Pt called states that the monitor to send a transmission is bad. Its transmitting when its not asked to and he doesn't want to be charged on signals that he did not send. Request a call back to discuss

## 2015-11-18 NOTE — Telephone Encounter (Signed)
RA - please advise. Thanks! 

## 2015-11-19 NOTE — Telephone Encounter (Signed)
Please ask sleep lab to place study in my box so I can read it

## 2015-11-22 NOTE — Telephone Encounter (Signed)
Attempted to contact the sleep lab. No answer, no option to leave a message. Will try back.

## 2015-11-22 NOTE — Telephone Encounter (Signed)
Sleep lab advised to place in Dr. Bari Mantis box. Nothing further needed.

## 2015-11-25 ENCOUNTER — Telehealth: Payer: Self-pay | Admitting: Pulmonary Disease

## 2015-11-25 NOTE — Telephone Encounter (Signed)
Patient returning our call-prm  °

## 2015-11-25 NOTE — Telephone Encounter (Signed)
Pt returning call.[name] ° °

## 2015-11-25 NOTE — Telephone Encounter (Signed)
LMOM TCB x1 for pt  5.6.17 e-mail from pt: Non-Urgent Medical Question       From    [name]     To    Rigoberto Noel, MD     Sent    11/06/2015 10:51 AM          Dr Olin Pia nurse called me on 4/27  And said she was canceling  The appt. that Dr Oleh Genin office had made for a split night sleep study on June 11. And that dr.  Elsworth Soho would reschedule a sleep study for a cpap only study as they had already determined from my sleep study on 4/16 that I have sleep apnea.  Evidently this has fell through the cracks as I have heard nothing since 4/27 and the split night sleep study still shows on June 11 on My Chart. Could someone please check on this issue for me?         There is some additional documentation in both the 4.27 and 5.11 phone notes, but it appears as though the patient has not been updated since 5.12

## 2015-11-25 NOTE — Telephone Encounter (Signed)
LVM for pt to return call

## 2015-11-26 NOTE — Telephone Encounter (Signed)
Have not seen this sleep study, Dr. Elsworth Soho, have you reviewed his sleep study?

## 2015-11-26 NOTE — Telephone Encounter (Signed)
Sharyn Lull, do you know if RA has read pt's sleep study yet? He is calling back. Thanks!

## 2015-11-30 NOTE — Telephone Encounter (Signed)
[phone], pt cb, he is very aggravated because he states he did study 10/17/15 at has yet to get results, states he is tired of playing phone tag with nurses, please advise

## 2015-11-30 NOTE — Telephone Encounter (Signed)
Patient is getting very upset because he has not heard back regarding the sleep study results yet. The results were placed in Dr. Bari Mantis box at the hospital to be reviewed on 11/22/15. Dr. Elsworth Soho, please advise if you have read these results yet.

## 2015-12-02 NOTE — Telephone Encounter (Signed)
Dr Elsworth Soho have you seen sleep study results?   Please advise. Thanks!

## 2015-12-05 ENCOUNTER — Encounter: Payer: Self-pay | Admitting: Pulmonary Disease

## 2015-12-06 ENCOUNTER — Encounter: Payer: Self-pay | Admitting: Internal Medicine

## 2015-12-06 NOTE — Telephone Encounter (Signed)
This has not been placed and my reading boxy yet Please call sleep lab [phone] and have them do this ASAP

## 2015-12-07 NOTE — Telephone Encounter (Signed)
Dr. Bari Mantis office is handling.

## 2015-12-07 NOTE — Telephone Encounter (Signed)
RA please advise on results.

## 2015-12-07 NOTE — Telephone Encounter (Signed)
Please see telephone encounters from 4/20 and 4/27 - patient was informed of his results right after his study was read and was told to followup with Pulmonary

## 2015-12-07 NOTE — Telephone Encounter (Signed)
He has been calling for this report for 2 months  This sleep study from 4/20 was ordered by cardiology-was read by Dr. Radford Pax It showed severe OSA-he stopped breathing 78 times per hour We just need to review a download on a CPAP machine to confirm that his CPAP is working well and correcting these events

## 2015-12-07 NOTE — Telephone Encounter (Signed)
Called and spoke with Lynnae Sandhoff, results are being routed into your box now and should be able to be read.  Please advise Dr Elsworth Soho. Thanks.

## 2015-12-07 NOTE — Telephone Encounter (Signed)
Vernon from sleep lab called me & he put Terri on the line. She states results are under notes tab on date of 10/21/15.  Dr Radford Pax read the results because it was ordered by the heart care doctor. Pt was scheduled for this coming Sunday but that has been cancelled by heart care because they said they would let Dr Elsworth Soho follow up with pt care. Terri states this was per [employer].

## 2015-12-09 ENCOUNTER — Encounter: Payer: Self-pay | Admitting: *Deleted

## 2015-12-09 ENCOUNTER — Encounter: Payer: Self-pay | Admitting: Pulmonary Disease

## 2015-12-09 NOTE — Telephone Encounter (Signed)
Sent webmail message to patient requesting that he bring SD card by our office to get download. Awaiting response from patient.

## 2015-12-12 ENCOUNTER — Encounter (HOSPITAL_BASED_OUTPATIENT_CLINIC_OR_DEPARTMENT_OTHER): Payer: Medicare Other

## 2015-12-23 ENCOUNTER — Encounter: Payer: Self-pay | Admitting: Pulmonary Disease

## 2015-12-24 ENCOUNTER — Encounter (HOSPITAL_COMMUNITY): Payer: Self-pay

## 2015-12-24 ENCOUNTER — Telehealth: Payer: Self-pay | Admitting: Pulmonary Disease

## 2015-12-24 ENCOUNTER — Ambulatory Visit (HOSPITAL_COMMUNITY)
Admission: RE | Admit: 2015-12-24 | Discharge: 2015-12-24 | Disposition: A | Discharge status: Home / Self Care | Payer: Medicare Other | Source: Ambulatory Visit | Attending: Cardiology | Admitting: Cardiology

## 2015-12-24 VITALS — BP 110/78 | HR 70 | Wt 226.8 lb

## 2015-12-24 DIAGNOSIS — Z91041 Radiographic dye allergy status: Secondary | ICD-10-CM | POA: Diagnosis not present

## 2015-12-24 DIAGNOSIS — N189 Chronic kidney disease, unspecified: Secondary | ICD-10-CM | POA: Insufficient documentation

## 2015-12-24 DIAGNOSIS — D631 Anemia in chronic kidney disease: Secondary | ICD-10-CM | POA: Diagnosis not present

## 2015-12-24 DIAGNOSIS — Z950 Presence of cardiac pacemaker: Secondary | ICD-10-CM | POA: Insufficient documentation

## 2015-12-24 DIAGNOSIS — E669 Obesity, unspecified: Secondary | ICD-10-CM | POA: Insufficient documentation

## 2015-12-24 DIAGNOSIS — I714 Abdominal aortic aneurysm, without rupture: Secondary | ICD-10-CM | POA: Diagnosis not present

## 2015-12-24 DIAGNOSIS — I130 Hypertensive heart and chronic kidney disease with heart failure and stage 1 through stage 4 chronic kidney disease, or unspecified chronic kidney disease: Secondary | ICD-10-CM | POA: Insufficient documentation

## 2015-12-24 DIAGNOSIS — M109 Gout, unspecified: Secondary | ICD-10-CM | POA: Diagnosis not present

## 2015-12-24 DIAGNOSIS — I5022 Chronic systolic (congestive) heart failure: Secondary | ICD-10-CM

## 2015-12-24 DIAGNOSIS — E1122 Type 2 diabetes mellitus with diabetic chronic kidney disease: Secondary | ICD-10-CM | POA: Insufficient documentation

## 2015-12-24 DIAGNOSIS — I723 Aneurysm of iliac artery: Secondary | ICD-10-CM | POA: Diagnosis not present

## 2015-12-24 DIAGNOSIS — I2510 Atherosclerotic heart disease of native coronary artery without angina pectoris: Secondary | ICD-10-CM | POA: Insufficient documentation

## 2015-12-24 DIAGNOSIS — J449 Chronic obstructive pulmonary disease, unspecified: Secondary | ICD-10-CM | POA: Diagnosis not present

## 2015-12-24 DIAGNOSIS — I509 Heart failure, unspecified: Secondary | ICD-10-CM

## 2015-12-24 DIAGNOSIS — I6529 Occlusion and stenosis of unspecified carotid artery: Secondary | ICD-10-CM | POA: Diagnosis not present

## 2015-12-24 DIAGNOSIS — I350 Nonrheumatic aortic (valve) stenosis: Secondary | ICD-10-CM | POA: Diagnosis not present

## 2015-12-24 DIAGNOSIS — N183 Chronic kidney disease, stage 3 unspecified: Secondary | ICD-10-CM

## 2015-12-24 DIAGNOSIS — Z7982 Long term (current) use of aspirin: Secondary | ICD-10-CM | POA: Insufficient documentation

## 2015-12-24 DIAGNOSIS — I441 Atrioventricular block, second degree: Secondary | ICD-10-CM | POA: Diagnosis not present

## 2015-12-24 DIAGNOSIS — E785 Hyperlipidemia, unspecified: Secondary | ICD-10-CM | POA: Diagnosis not present

## 2015-12-24 DIAGNOSIS — Z953 Presence of xenogenic heart valve: Secondary | ICD-10-CM | POA: Insufficient documentation

## 2015-12-24 DIAGNOSIS — I255 Ischemic cardiomyopathy: Secondary | ICD-10-CM | POA: Diagnosis not present

## 2015-12-24 DIAGNOSIS — Z79899 Other long term (current) drug therapy: Secondary | ICD-10-CM | POA: Diagnosis not present

## 2015-12-24 DIAGNOSIS — Z8546 Personal history of malignant neoplasm of prostate: Secondary | ICD-10-CM | POA: Diagnosis not present

## 2015-12-24 DIAGNOSIS — Z923 Personal history of irradiation: Secondary | ICD-10-CM | POA: Diagnosis not present

## 2015-12-24 DIAGNOSIS — G4733 Obstructive sleep apnea (adult) (pediatric): Secondary | ICD-10-CM

## 2015-12-24 DIAGNOSIS — I442 Atrioventricular block, complete: Secondary | ICD-10-CM

## 2015-12-24 DIAGNOSIS — J80 Acute respiratory distress syndrome: Secondary | ICD-10-CM | POA: Insufficient documentation

## 2015-12-24 DIAGNOSIS — Z9889 Other specified postprocedural states: Secondary | ICD-10-CM | POA: Insufficient documentation

## 2015-12-24 DIAGNOSIS — Z951 Presence of aortocoronary bypass graft: Secondary | ICD-10-CM | POA: Diagnosis not present

## 2015-12-24 DIAGNOSIS — Z87891 Personal history of nicotine dependence: Secondary | ICD-10-CM | POA: Diagnosis not present

## 2015-12-24 DIAGNOSIS — J8410 Pulmonary fibrosis, unspecified: Secondary | ICD-10-CM | POA: Diagnosis not present

## 2015-12-24 DIAGNOSIS — Z6836 Body mass index (BMI) 36.0-36.9, adult: Secondary | ICD-10-CM | POA: Diagnosis not present

## 2015-12-24 LAB — BASIC METABOLIC PANEL
Anion gap: 10 (ref 5–15)
BUN: 51 mg/dL — ABNORMAL HIGH (ref 6–20)
CALCIUM: 10 mg/dL (ref 8.9–10.3)
CHLORIDE: 100 mmol/L — AB (ref 101–111)
CO2: 25 mmol/L (ref 22–32)
CREATININE: 2.3 mg/dL — AB (ref 0.61–1.24)
GFR calc Af Amer: 31 mL/min — ABNORMAL LOW (ref >60)
GFR calc non Af Amer: 27 mL/min — ABNORMAL LOW (ref >60)
Glucose, Bld: 161 mg/dL — ABNORMAL HIGH (ref 65–99)
Potassium: 4.9 mmol/L (ref 3.5–5.1)
SODIUM: 135 mmol/L (ref 135–145)

## 2015-12-24 LAB — LIPID PANEL
CHOLESTEROL: 100 mg/dL (ref 0–200)
HDL: 24 mg/dL — ABNORMAL LOW (ref >40)
LDL Cholesterol: 27 mg/dL (ref 0–99)
TRIGLYCERIDES: 245 mg/dL — AB (ref <150)
Total CHOL/HDL Ratio: 4.2 RATIO
VLDL: 49 mg/dL — AB (ref 0–40)

## 2015-12-24 MED ORDER — LISINOPRIL 2.5 MG PO TABS: 2.5000 mg | ORAL_TABLET | Freq: Every day | ORAL | Status: DC

## 2015-12-24 NOTE — Telephone Encounter (Signed)
Patient brought by his SD card to see if we could get a download from it. Downloaded successfully. Download has been placed in Dr. Bari Mantis box for his review.

## 2015-12-24 NOTE — Patient Instructions (Signed)
START Lisinopril 2.5 mg, one tab daily at bedtime  Labs today and again in one week  Your physician has requested that you have an echocardiogram. Echocardiography is a painless test that uses sound waves to create images of your heart. It provides your doctor with information about the size and shape of your heart and how well your heart's chambers and valves are working. This procedure takes approximately one hour. There are no restrictions for this procedure.  Your physician recommends that you schedule a follow-up appointment in: 2 months with Dr [name]  Do the following things EVERYDAY: 1) Weigh yourself in the morning before breakfast. Write it down and keep it in a log. 2) Take your medicines as prescribed 3) Eat low salt foods-Limit salt (sodium) to 2000 mg per day.  4) Stay as active as you can everyday 5) Limit all fluids for the day to less than 2 liters 6)

## 2015-12-24 NOTE — Telephone Encounter (Signed)
Called spoke with Estill Bamberg at [facility]. She states she received the sleep study but needs an order for the CPAP supplies. I explained to her that I would placed the order today. She voiced understanding and had no further questions. Order placed. Nothing further needed.

## 2015-12-26 NOTE — Progress Notes (Signed)
Advanced Heart Failure Note   Patient ID: [name], male   DOB: 08-01-1940, 75 y.o.   MRN: [id number] PCP: Dr. [name] Nephrologist: Dr Deterding Vascular: Dr Kellie Simmering HF: [name]  76 yo with history of ARDS/respiratory failure and tracheostomy in 07/2012 as well as DM, HTN, and COPD presented to the ER at [facility] with CHF in 10/2012. Patient had a prolonged hospitalization in 07/2012 with H1N1 influenza and pneumococcal PNA. This progressed to ARDS. He was intubated and ended up with tracheostomy. He had the trach removed. He also had a left empyema requiring chest tube, septic shock with elevated troponin, and AKI which resolved.  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   He was admitted again in 10/2012 from his nursing home with exertional dyspnea and orthopnea. He had gained 21 lbs. Echo at admission showed EF 35% with global hypokinesis that looked worse in the anteroseptal wall. He also was noted to have aortic stenosis rated as moderate to severe. He was diuresed and cathed, showing severe 3 vessel disease. He then had CABG-AVR (bioprosthetic valve).   He had a large AAA and underwent surgical repair in 07/2013.  He had AKI and Pseudomonas PNA post-operatively.  He was discharged to a nursing home. Echo in 1/15 showed EF 55-60% with mildly dilated and dysfunctional RV and a bioprosthetic aortic valve with mean gradient 32 mmHg (higher than expected).   Repeat limited echo in 08/2013 showed shower aortic valve mean gradient of 21 mmHg.   In 11/15, patient had a holter monitor placed for bradycardia.  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  Baseline bifascicular block.  His HR also was noted to drop into the 40s with exercise, suggesting exercise-associated heart block.  He underwent Medtronic-PCM placement in 1/16 by Dr Caryl Comes.   Echo in 10/16 showed fall in EF to 30% with mean aortic valve gradient 21 mmHg.  Repeat echo in 2/17 showed EF 20-25%, mean aortic valve  gradient 24 mmHg with moderately decreased RV systolic function.  Cardiolite in 10/16 showed inferior scar with no ischemia.  He is RV pacing almost all the time.  Low dose DSE was done in 2/17 to assess for significant bioprosthetic valve stenosis => mean gradient with dobutamine was only 31 mmHg, so suspect no more than moderate stenosis.  He had St Jude CRT upgrade in 3/17.  He decided against ICD.   He returns HF follow up. He seems to be doing ok.  Weight is down 5 lbs.  He is short of breath with inclines/hills.  Does ok generally on flat ground, thinks CRT has helped.  Able to wash his car without problems and can walk 1/4 mile with no difficulty.  No chest pain.  No lightheadedness. He is using CPAP.  I walked him in the office today, oxygen saturation stayed 92-94% on room air.  Doing cardiac rehab in [place].   Labs (2/15): K 4, creatinine 1.6, BNP 166 Labs (3/15): Hgb 11.6, pro-BNP 1037, creatinine 1.76, BUN 30, LDL 75, HDL 26 Labs (8/15): K 3.9, creatinine 1.55, HCT 40.6, LDL 71, HDL 27 Labs (1/16): K 4.3, Creatinine 1.6, HCT 42.9 Labs (2/17): K 4.0 Creatinine 1.64, TSH normal.  Labs (3/17): K 4.5, creatinine 1.49, HCT 42.9 Labs (4/17): K 4, creatinine 1.48, BNP 125  ROS: All systems negative except as listed in HPI, PMH and Problem List.  PMH: 1. PNA (H1N1 influenza + pneumococcus) in [id number] complicated by respiratory failure/ARDS. Required tracheostomy, now weaned off. Had  left empyema requiring chest tube.  Has developed post-infectious pulmonary fibrosis.  2. Prostate CA s/p radiation treatment. Has indwelling foley.  3. OSA: using CPAP 4. HTN  5. Hyperlipidemia  6. COPD: History of heavy smoking. PFTs (4/14) with mixed obstructive (COPD) and restrictive (post-ARDS) picture.  7. Type II diabetes  8. Anemia of chronic disease.  9. Ischemic cardiomyopathy:  - Echo (1/14) with severely dilated LV, EF 30-35%, diffuse hypokinesis worse in the anteroseptal wall, grade II diastolic  dysfunction, mild MR, AS interpreted as "moderate to severe" with aortic valve mean gradient 23 mmHg.  - Echo (4/14) witih EF 35-40%, mid to apical anteroseptal akinesis, moderate to severe AS with mean gradient 33 mmHg, AVA 0.91 cm^2.  - cho (5/14) post CABG showed EF 50%, anteroseptal hypokinesis, bioprosthetic aortic valve well-seated. - Echo (1/15) with EF 55-60%, mild LVH, mildly dilated RV with mildly decreased systolic function, D-shaped interventricular septum, bioprosthetic aortic valve with mean gardient 32 mmHg.   - Echo (3/15) with EF 55-60%, mild LVH, bioprosthetic aortic valve with mean gradient 21 mmHg (lower), no AI, RV moderately dilated with mild to moderately decreased systolic function.   - Echo (10/16) with EF 30%, bioprosthetic aortic valve with mean gradient 21 mmHg.   - Echo (2/17) with EF 20-25%, mild LVH, aortic valve mean gradient 24 mmHg/peak 39 mmHg, RV moderately dilated with moderately decreased systolic function.  - St Jude CRT upgrade in 3/17 (did not want ICD).  10. Aortic stenosis: Moderate to severe by echo in 4/14. Bioprosthetic #21 [facility] Ease aortic valve replacement in 4/14.  Low dose DSE (2/17) showed that Mr Hillis likely has no more than moderate bioprosthetic valve stenosis with mean gradient up to 31 mmHg with dobutamine.  11. CAD: LHC (4/14) with 95% pLAD, 70-80% ostial ramus, 70% mLCx, 90% pRCA, EF 35%.  - CABG 4/14 with LIMA-LAD, seq SVG-ramus and OM1, SVG-PDA.  - Cardiolite (10/16) with EF 42%, fixed inferior defect, no ischemia.   12. Carotid stenosis: Carotid dopplers (0000000) with A999333 LICA stenosis.  Carotids (123456) with 123456 LICA stenosis. Carotids (Q000111Q) with 123456 LICA stenosis.  13. AAA: 5/14 CT showed > 6 cm AAA, also right iliac aneurysm. Not stent graft candidate.  7/14 CT showed 7 cm AAA.  Surgical repair 1/15.  14. CKD: AKI in 7/14 from contrast nephropathy.  AKI in 1/15 after AAA repair.  15. Contrast allergy.  16. ABIs 9/14 were  normal.  17. Gout 18. 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  HR drops with exercise (likely exercise-induced heart block).  Medtronic dual chamber PPM placed in 2016.  Recent pacemaker interrogation showed complete heart block.  He is pacemaker-dependent, upgraded to CRT in 3/17.   SH: Quit smoking in 1/14, married, no ETOH, lives in [place].   FH: No premature CAD  ROS: All systems reviewed and negative except as per HPI.    Current Outpatient Prescriptions  Medication Sig Dispense Refill  . allopurinol (ZYLOPRIM) 100 MG tablet Take 100 mg by mouth at bedtime. Also take a 300 mg tablet during the day    . allopurinol (ZYLOPRIM) 300 MG tablet Take 300 mg by mouth every morning.     Marland Kitchen aspirin EC 81 MG tablet Take 81 mg by mouth daily.    Marland Kitchen atorvastatin (LIPITOR) 80 MG tablet TAKE 1 TABLET BY MOUTH EVERY DAY BEFORE BREAKFAST 30 tablet 1  . bisoprolol (ZEBETA) 5 MG tablet Take 0.5 tablets (2.5 mg total)  by mouth at bedtime. 90 tablet 3  . budesonide (PULMICORT) 0.25 MG/2ML nebulizer solution Take 0.25 mg by nebulization 2 (two) times daily.    . calcitRIOL (ROCALTROL) 0.25 MCG capsule Take 0.25 mcg by mouth daily.     . fexofenadine (ALLEGRA) 180 MG tablet Take 90 mg by mouth daily.     Marland Kitchen ipratropium-albuterol (DUONEB) 0.5-2.5 (3) MG/3ML SOLN Take 3 mLs by nebulization every 4 (four) hours as needed (shortness of breath).    . ivabradine (CORLANOR) 5 MG TABS tablet Take 1 tablet (5 mg total) by mouth 2 (two) times daily with a meal. 60 tablet 3  . JANUVIA 100 MG tablet Take 50 mg by mouth daily.   6  . NITROSTAT 0.4 MG SL tablet PLACE 1 TABLET UNDER TONGUE EVERY 5 MINUTES AS NEEDED FOR CHEST PAIN 25 tablet 0  . pantoprazole (PROTONIX) 40 MG tablet Take 40 mg by mouth at bedtime.     . potassium chloride (K-DUR,KLOR-CON) 10 MEQ tablet Take 2 tablets (20 mEq total) by mouth daily. 360 tablet 3  . spironolactone (ALDACTONE) 25 MG tablet Take 0.5 tablets (12.5 mg  total) by mouth every evening. 15 tablet 3  . tamsulosin (FLOMAX) 0.4 MG CAPS capsule Take 0.4 mg by mouth at bedtime.     . torsemide (DEMADEX) 20 MG tablet Take 2 tablets (40 mg total) by mouth 2 (two) times daily. 360 tablet 3  . lisinopril (PRINIVIL,ZESTRIL) 2.5 MG tablet Take 1 tablet (2.5 mg total) by mouth daily. 90 tablet 3   No current facility-administered medications for this encounter.    Filed Vitals:   12/24/15 1057  BP: 110/78  Pulse: 70  Weight: 226 lb 12.8 oz (102.876 kg)  SpO2: 92%    Wt Readings from Last 3 Encounters:  12/24/15 226 lb 12.8 oz (102.876 kg)  10/21/15 231 lb 12 oz (105.121 kg)  10/17/15 231 lb (104.781 kg)    PHYSICAL EXAM: General:  NAD, overweight, wife present HEENT: normal Neck: Thick, JVP 7 cm. Carotids 2+ bilaterally; no bruits. No thyromegaly or nodule noted.  Cor: PMI normal. RRR. No rubs, gallops. 2/6 early SEM RUSB.  Lungs: Decreased bilaterally Abdomen: Obese, NT, mildly distended, no HSM. No bruits or masses. +BS  Extremities: no cyanosis, clubbing, rash.  Trace ankle edema.   Neuro: alert & orientedx3, cranial nerves grossly intact. Moves all 4 extremities w/o difficulty. Affect pleasant.  ASSESSMENT & PLAN: 1. CAD: Status post CABG for 3VD.  Cardiolite in 10/16 with inferior scar, no ischemia.  No chest pain.  - Continue ASA, statin, and bisoprolol.  - Continues cardiac rehab.  2. Chronic systolic CHF:  Most recent echo in 2/17 showed EF down to 20-25%.  No ischemia on Cardiolite.  Constant RV pacing may have led to decline in EF.  We considered bioprosthetic valve stenosis as cause of fall in EF, but low gradient DSE did not suggest severe stenosis.  NYHA class II-III, volume looks ok.  He is now s/p upgrade of pacemaker to CRT.  - Continue torsemide 40 mg twice a day.  - I will try him on lisinopril again, he will take 2.5 mg in the evening.  BMET today and repeat in 2 wks.  - Continue bisoprolol 2.5 mg twice a day and ivabradine  5 mg bid.  - Continue spironolactone 12.5 mg qhs.  - Echo in 8/17 to assess for improvement with CRT.   3. 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 Suspect that intrinsic lung  disease (post-infectious pulmonary fibrosis) is a contributor to his dyspnea and hypoxemia. He is now off oxygen (just using CPAP at night).  Oxygen saturation was acceptable with ambulation in office today.  4. Carotid stenosis: Followed at [facility]. 5. Hyperlipidemia: Continue statin.  Check lipids today.  6. AAA: s/p surgical repair. Stable on CT 08/11/14.  7. Status post bioprosthetic AVR: Gradient across the aortic valve has been elevated. There was some concern for possible low gradient severe bioprosthetic aortic stenosis based on most recent echo, but recent low dose DSE suggests that valvular stenosis is no more than moderate.   - He will need antibiotic prophylaxis with dental work.  8. CKD: BMET today. 9. 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  S/p Medtronic PCM 1/16.   Patient is pacemaker-dependent with complete heart block.  He had upgrade to CRT in 3/17.   10. OSA: He is on CPAP.    Loralie Champagne 12/26/2015

## 2015-12-28 ENCOUNTER — Encounter: Payer: Self-pay | Admitting: Internal Medicine

## 2015-12-28 ENCOUNTER — Other Ambulatory Visit: Payer: Self-pay | Admitting: Cardiology

## 2015-12-28 ENCOUNTER — Other Ambulatory Visit: Payer: Self-pay | Admitting: *Deleted

## 2015-12-28 ENCOUNTER — Ambulatory Visit (INDEPENDENT_AMBULATORY_CARE_PROVIDER_SITE_OTHER): Payer: Medicare Other | Admitting: Internal Medicine

## 2015-12-28 VITALS — BP 118/74 | HR 72 | Ht 65.5 in | Wt 230.0 lb

## 2015-12-28 DIAGNOSIS — I6523 Occlusion and stenosis of bilateral carotid arteries: Secondary | ICD-10-CM

## 2015-12-28 DIAGNOSIS — I442 Atrioventricular block, complete: Secondary | ICD-10-CM

## 2015-12-28 DIAGNOSIS — I429 Cardiomyopathy, unspecified: Secondary | ICD-10-CM

## 2015-12-28 DIAGNOSIS — Z950 Presence of cardiac pacemaker: Secondary | ICD-10-CM

## 2015-12-28 DIAGNOSIS — I5042 Chronic combined systolic (congestive) and diastolic (congestive) heart failure: Secondary | ICD-10-CM | POA: Diagnosis not present

## 2015-12-28 DIAGNOSIS — R000 Tachycardia, unspecified: Secondary | ICD-10-CM | POA: Diagnosis not present

## 2015-12-28 LAB — CUP PACEART INCLINIC DEVICE CHECK
Battery Remaining Longevity: 75.6
Battery Voltage: 2.99 V
Brady Statistic RA Percent Paced: 3.9 %
Brady Statistic RV Percent Paced: 99.17 %
Date Time Interrogation Session: 20170627163619
Implantable Lead Implant Date: 20160113
Implantable Lead Implant Date: 20160113
Implantable Lead Implant Date: 20170322
Implantable Lead Location: 753858
Implantable Lead Location: 753859
Implantable Lead Location: 753860
Implantable Lead Model: 5076
Implantable Lead Model: 5076
Lead Channel Impedance Value: 400 Ohm
Lead Channel Impedance Value: 512.5 Ohm
Lead Channel Impedance Value: 537.5 Ohm
Lead Channel Pacing Threshold Amplitude: 0.5 V
Lead Channel Pacing Threshold Amplitude: 0.5 V
Lead Channel Pacing Threshold Amplitude: 1.875 V
Lead Channel Pacing Threshold Pulse Width: 0.4 ms
Lead Channel Pacing Threshold Pulse Width: 0.4 ms
Lead Channel Pacing Threshold Pulse Width: 0.4 ms
Lead Channel Sensing Intrinsic Amplitude: 3.5 mV
Lead Channel Setting Pacing Amplitude: 1.5 V
Lead Channel Setting Pacing Amplitude: 2.5 V
Lead Channel Setting Pacing Amplitude: 2.875
Lead Channel Setting Pacing Pulse Width: 0.4 ms
Lead Channel Setting Pacing Pulse Width: 0.4 ms
Lead Channel Setting Sensing Sensitivity: 5 mV
Pulse Gen Model: 3262
Pulse Gen Serial Number: 7838612

## 2015-12-28 MED ORDER — TORSEMIDE 20 MG PO TABS: 40.0000 mg | ORAL_TABLET | Freq: Every day | ORAL | Status: DC

## 2015-12-28 NOTE — Patient Instructions (Signed)
Medication Instructions: - Your physician recommends that you continue on your current medications as directed. Please refer to the Current Medication list given to you today.  Labwork: - none  Procedures/Testing: - echo as scheduled  Follow-Up: - Remote monitoring is used to monitor your Pacemaker of ICD from home. This monitoring reduces the number of office visits required to check your device to one time per year. It allows [place] to keep an eye on the functioning of your device to ensure it is working properly. You are scheduled for a device check from home on 03/28/16. You may send your transmission at any time that day. If you have a wireless device, the transmission will be sent automatically. After your physician reviews your transmission, you will receive a postcard with your next transmission date.  - Your physician wants you to follow-up in: 9 months with Dr. Caryl Comes. You will receive a reminder letter in the mail two months in advance. If you don't receive a letter, please call our office to schedule the follow-up appointment.  Any Additional Special Instructions Will Be Listed Below (If Applicable).     If you need a refill on your cardiac medications before your next appointment, please call your pharmacy.

## 2015-12-28 NOTE — Progress Notes (Signed)
Patient Care Team: [name], MD as PCP - General (Internal Medicine) Larey Dresser, MD as Attending Physician (Cardiology)   HPI  [name] is a 75 y.o. 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 He underwent device implantation January 2016.  Cardiac evaluation has included an echocardiogram 1/15 demonstrated near-normal LV function.  Interval echocardiogram 10/26 demonstrated LV dysfunction with an EF of 30-35%. Myoview confirmed this.  Repeat Echo unfortunately showed further interval deterioration 20-25%.  Because of this he underwent CRT upgrade. He is noted much less shortness of breath. He is scheduled for repeat echo in 2 months on the care of Dr. DM.  There is no chest pain. He's had no syncope.   .    Past Medical History  Diagnosis Date  . Hypertension   . Hyperlipidemia   . Prediabetes   . CHF (congestive heart failure) ([place]) 07/2012; 10/17/2012  . COPD (chronic obstructive pulmonary disease) ([place])     a.  History of heavy smoking. PFTs (4/14) with mixed obstructive (COPD) and restrictive (post-ARDS) picture.   . Pneumonia 07/2012    PNA (H1N1 influenza + pneumococcus) in [id number] complicated by respiratory failure/ARDS. Required tracheostomy, now weaned off. Had left empyema requiring chest tube.   . Diabetes mellitus without complication ([place])   . History of blood transfusion     "lots since January" (10/17/2012)  . Ischemic cardiomyopathy   . Anemia of chronic disease   . Aortic stenosis     Moderate to severe by echo in 4/14. Bioprosthetic #21 [facility] Ease aortic valve replacement in 4/14.   Marland Kitchen CAD (coronary artery disease)     a. 4/14 CABG: LIMA-LAD, seq SVG-ramus & OM1, SVG-PDA  . Carotid arterial disease (HCC)     Carotid dopplers (0000000) with A999333 LICA stenosis.   Marland Kitchen AAA (abdominal aortic aneurysm) ([place])     5/14 CT showed > 6 cm AAA, also right iliac aneurysm.  Not stent graft candidate.   . Esophageal reflux   . Ischemic cardiomyopathy     a. 4/14 LHC: pLAD 95, ost ramus 70-80, mLCx 90, EF 30%  . Chronic systolic heart failure (HCC)     a. 1/14 ECHO: sev dil LV, EF30-35%, diff HK, mild MR, AS severe, AV grad 35 b. 8/14 ECHO: EF 55-60%, mild biopros AV sten mn grad. 25, RV mild dil, RA mild dil  . Complication of anesthesia     during last surgery had to be given special medicine b/c "something dropped" during surgery   . Anxiety     pt. admits that he has anxiety at times   . Shortness of breath     still goes deer hunting by himself  . OSA (obstructive sleep apnea)     on cpap- every sleep time.   . Chronic kidney disease     increased creatinine recently - being followed, near kidney failure fr. contrast dye   . Prostate cancer [facility])      s/p radiation treatment. - (PT. DENIES)Has indwelling foley.   . Arthritis     Past Surgical History  Procedure Laterality Date  . Transurethral microwave therapy  10/15/2012  . Nasal fracture surgery  1970's  . Tracheostomy  07/2012  . Tracheostomy closure  08/2012  . Anal fissure repair  2008  . Coronary artery bypass graft N/A 10/25/2012    Procedure: CORONARY ARTERY BYPASS GRAFTING (CABG);  Surgeon: Remo Lipps  Chaya Jan, MD;  Location: [facility];  Service: Open Heart Surgery;  Laterality: N/A;  times 4 using left internal mammary artery and endoscopically harvested bilateral saphenous vein   . Aortic valve replacement N/A 10/25/2012    Procedure: AORTIC VALVE REPLACEMENT (AVR);  Surgeon: Melrose Nakayama, MD;  Location: [place];  Service: Open Heart Surgery;  Laterality: N/A;  . Intraoperative transesophageal echocardiogram N/A 10/25/2012    Procedure: INTRAOPERATIVE TRANSESOPHAGEAL ECHOCARDIOGRAM;  Surgeon: Melrose Nakayama, MD;  Location: [place];  Service: Open Heart Surgery;  Laterality: N/A;  . Cardiac valve replacement    . Abdominal aortic aneurysm repair N/A 07/10/2013    Procedure: Resection and  Graftiong of perirenal AAA; Insertion 14 x 8 Hemashield Graft Aorta to Left Common Iliac and to Right Common Femoral Artery With Ligastion of Right External and Interanl Iliac Artery;  Surgeon: Mal Misty, MD;  Location: [facility] OR;  Service: Vascular;  Laterality: N/A;  . Left and right heart catheterization with coronary angiogram N/A 10/22/2012    Procedure: LEFT AND RIGHT HEART CATHETERIZATION WITH CORONARY ANGIOGRAM;  Surgeon: Larey Dresser, MD;  Location: [facility] CATH LAB;  Service: Cardiovascular;  Laterality: N/A;  . Permanent pacemaker insertion N/A 07/15/2014    Procedure: PERMANENT PACEMAKER INSERTION;  Surgeon: Deboraha Sprang, MD;  Location: [facility] CATH LAB;  Service: Cardiovascular;  Laterality: N/A;  . Pacemaker revision  09/22/2015    pacemaker upgrade   . Ep implantable device N/A 09/22/2015    Procedure: BiV Upgrade;  Surgeon: Deboraha Sprang, MD;  Location: [facility] CV LAB;  Service: Cardiovascular;  Laterality: N/A;    Current Outpatient Prescriptions  Medication Sig Dispense Refill  . allopurinol (ZYLOPRIM) 100 MG tablet Take 100 mg by mouth at bedtime. Also take a 300 mg tablet during the day    . allopurinol (ZYLOPRIM) 300 MG tablet Take 300 mg by mouth every morning.     Marland Kitchen aspirin EC 81 MG tablet Take 81 mg by mouth daily.    Marland Kitchen atorvastatin (LIPITOR) 80 MG tablet TAKE 1 TABLET BY MOUTH EVERY DAY BEFORE BREAKFAST 30 tablet 1  . bisoprolol (ZEBETA) 5 MG tablet Take 0.5 tablets (2.5 mg total) by mouth at bedtime. 90 tablet 3  . budesonide (PULMICORT) 0.25 MG/2ML nebulizer solution Take 0.25 mg by nebulization 2 (two) times daily.    . calcitRIOL (ROCALTROL) 0.25 MCG capsule Take 0.25 mcg by mouth daily.     . fexofenadine (ALLEGRA) 180 MG tablet Take 90 mg by mouth daily.     Marland Kitchen ipratropium-albuterol (DUONEB) 0.5-2.5 (3) MG/3ML SOLN Take 3 mLs by nebulization every 4 (four) hours as needed (shortness of breath).    . ivabradine (CORLANOR) 5 MG TABS tablet Take 1 tablet (5 mg total) by  mouth 2 (two) times daily with a meal. 60 tablet 3  . JANUVIA 100 MG tablet Take 50 mg by mouth daily.   6  . NITROSTAT 0.4 MG SL tablet PLACE 1 TABLET UNDER TONGUE EVERY 5 MINUTES AS NEEDED FOR CHEST PAIN 25 tablet 0  . pantoprazole (PROTONIX) 40 MG tablet Take 40 mg by mouth at bedtime.     . potassium chloride (K-DUR,KLOR-CON) 10 MEQ tablet Take 2 tablets (20 mEq total) by mouth daily. 360 tablet 3  . spironolactone (ALDACTONE) 25 MG tablet Take 0.5 tablets (12.5 mg total) by mouth every evening. 15 tablet 3  . tamsulosin (FLOMAX) 0.4 MG CAPS capsule Take 0.4 mg by mouth at bedtime.     Marland Kitchen  torsemide (DEMADEX) 20 MG tablet Take 2 tablets (40 mg total) by mouth 2 (two) times daily. 360 tablet 3   No current facility-administered medications for this visit.    Allergies  Allergen Reactions  . Omnipaque [Iohexol] Other (See Comments)    Decreased kidney function  . Primaxin [Imipenem] Hives  . Cephalosporins Rash    Blisters  . Lisinopril     Drops Blood pressure  nausea   . Clindamycin/Lincomycin Swelling and Rash    Review of Systems negative except from HPI and PMH  Physical Exam BP 118/74 mmHg  Pulse 72  Ht 5' 5.5" (1.664 m)  Wt 230 lb (104.327 kg)  BMI 37.68 kg/m2 Well developed and well nourished in no acute distress HENT normal E scleral and icterus clear Neck Supple JVP9; carotids brisk and full Clear to ausculation Device pocket well healed; without hematoma or erythema.  There is no tethering Regular rate and rhythm, no murmurs gallops or rub Soft with active bowel sounds No clubbing cyanosis tr Edema Alert and oriented, grossly normal motor and sensory function Skin Warm and Dry  ECG demonstrates P synchronous pacing  Assessment and  Plan  High-grade heart block now device dependent  Obesity  Obstructive sleep apnea  Cardiomyopathy progressive question pacemaker  Congestive heart failure-chronic-mixed  Sinus tachycardia  CRT Pacemaker-Medtronic  The patient's device was interrogated.  The information was reviewed. No changes were made in the programming.     Ischemic heart disease with prior bypass surgery   Sinus tachycardia is better with Ivabradine.  Dyspnea is better with resynchronization.  Repeat echo is pending  Euvolemic continue current meds

## 2015-12-30 NOTE — Telephone Encounter (Signed)
Awaiting RA's return to office

## 2016-01-02 NOTE — Telephone Encounter (Signed)
Excellent usage Pr adequate

## 2016-01-03 ENCOUNTER — Other Ambulatory Visit: Payer: Self-pay | Admitting: *Deleted

## 2016-01-03 MED ORDER — IVABRADINE HCL 5 MG PO TABS: 5.0000 mg | ORAL_TABLET | Freq: Two times a day (BID) | ORAL | Status: DC

## 2016-01-03 NOTE — Telephone Encounter (Signed)
Patient is returning Michelle's call, CB is [phone].

## 2016-01-03 NOTE — Telephone Encounter (Signed)
Left message for patient to call back  

## 2016-01-03 NOTE — Telephone Encounter (Signed)
Spoke with pt, aware of results/recs.  Nothing further needed.  

## 2016-01-05 ENCOUNTER — Other Ambulatory Visit: Payer: Self-pay | Admitting: Cardiology

## 2016-01-07 LAB — BASIC METABOLIC PANEL
BUN / CREAT RATIO: 20 (ref 10–24)
BUN: 34 mg/dL — AB (ref 8–27)
CALCIUM: 9.7 mg/dL (ref 8.6–10.2)
CO2: 25 mmol/L (ref 18–29)
Chloride: 96 mmol/L (ref 96–106)
Creatinine, Ser: 1.71 mg/dL — ABNORMAL HIGH (ref 0.76–1.27)
GFR, EST AFRICAN AMERICAN: 45 mL/min/{1.73_m2} — AB (ref >59)
GFR, EST NON AFRICAN AMERICAN: 39 mL/min/{1.73_m2} — AB (ref >59)
Glucose: 108 mg/dL — ABNORMAL HIGH (ref 65–99)
Potassium: 4.9 mmol/L (ref 3.5–5.2)
Sodium: 138 mmol/L (ref 134–144)

## 2016-01-11 ENCOUNTER — Encounter (HOSPITAL_COMMUNITY): Payer: Self-pay | Admitting: Cardiology

## 2016-01-11 ENCOUNTER — Encounter: Payer: Self-pay | Admitting: Cardiology

## 2016-01-13 ENCOUNTER — Other Ambulatory Visit (HOSPITAL_COMMUNITY): Payer: Self-pay | Admitting: Cardiology

## 2016-01-18 ENCOUNTER — Encounter: Payer: Self-pay | Admitting: Internal Medicine

## 2016-01-26 ENCOUNTER — Telehealth (HOSPITAL_COMMUNITY): Payer: Self-pay | Admitting: *Deleted

## 2016-01-26 NOTE — Telephone Encounter (Signed)
received note from [place], pt's BP was in the 80/60 and improved to 100/62 after 2 glases of water, after exercise was 87/57 and 98/60 before they d/c'd him home, pt was asymptomatic.  Per Dr [name] pt should check BP at hime for 1 week and call us with readings.  Attempted to call pt and Left message to call back

## 2016-01-27 NOTE — Telephone Encounter (Signed)
Spoke with pt about taking his BP for a week and then calling us back with the readings. He said that he had been checking them and they have been good. He will call with update next week.

## 2016-01-27 NOTE — Telephone Encounter (Signed)
Left detailed message and asked pt to call back to let me know he received the voice message

## 2016-01-28 ENCOUNTER — Encounter: Payer: Self-pay | Admitting: Pulmonary Disease

## 2016-01-31 ENCOUNTER — Other Ambulatory Visit (HOSPITAL_COMMUNITY): Payer: Self-pay | Admitting: Cardiology

## 2016-01-31 ENCOUNTER — Ambulatory Visit (INDEPENDENT_AMBULATORY_CARE_PROVIDER_SITE_OTHER): Payer: Medicare Other | Admitting: Adult Health

## 2016-01-31 ENCOUNTER — Encounter: Payer: Self-pay | Admitting: Adult Health

## 2016-01-31 DIAGNOSIS — I6523 Occlusion and stenosis of bilateral carotid arteries: Secondary | ICD-10-CM | POA: Diagnosis not present

## 2016-01-31 DIAGNOSIS — G4733 Obstructive sleep apnea (adult) (pediatric): Secondary | ICD-10-CM

## 2016-01-31 DIAGNOSIS — J449 Chronic obstructive pulmonary disease, unspecified: Secondary | ICD-10-CM | POA: Diagnosis not present

## 2016-01-31 NOTE — Assessment & Plan Note (Signed)
Compensated on current regimen We'll check with primary care physician regarding Prevnar  Plan  Continue on Pulmicort nebulizer twice daily Continue on CPAP At bedtime   Work on weight loss Do not drive if sleepy follow up Dr. Elsworth Soho  In 6 months and As needed   Check with Primary MD if you have gotten Prevnar 13.

## 2016-01-31 NOTE — Patient Instructions (Addendum)
Continue on Pulmicort nebulizer twice daily Continue on CPAP At bedtime   Work on weight loss Do not drive if sleepy follow up Dr. Elsworth Soho  In 6 months and As needed   Check with Primary MD if you have gotten Prevnar 13.

## 2016-01-31 NOTE — Assessment & Plan Note (Signed)
Compensated on CPAP   Plan  Continue on Pulmicort nebulizer twice daily Continue on CPAP At bedtime   Work on weight loss Do not drive if sleepy follow up Dr. Elsworth Soho  In 6 months and As needed   Check with Primary MD if you have gotten Prevnar 13.

## 2016-01-31 NOTE — Progress Notes (Signed)
Subjective:    Patient ID: CAYL PIKE, male    DOB: 05/22/1941, 75 y.o.   MRN: [id number]  HPI 75 yo ex-heavy smoker for FU of COPD & OSA. He smoked 2-3 PPD   He had prolonged hospitalization in 07/2012 with H1N1 influenza and pneumococcal PNA/ ARDS requiring tracheostomy. He also had a left empyema requiring chest tube, septic shock with elevated troponin, and AKI which resolved.   He had aortic stenosis - moderate to severe & severe 3 vessel disease on cath. He then had CABG-AVR (bioprosthetic valve). He was discharged on home O2.  Echo in 10/2012  showed EF up to 50%.    Admitted 12/2012 with hyperkalemia 6.5 and elevated creatinine 4.7 thought to be from contrast given with CTA abdomen/pelvis. Coreg, hydralazine, IMDUR, Torsemide were stopped. He was started on 160 mg of lasix twice a day. Discharge weight was 207 pounds  Significant tests/ events 10/23/12 PFTs reveal severe obstructive / restrictive disease & severely reduced DLCO.  PFT 01/21/13 >FEV1 58%, ratio 58, +25% change after BD , DLCO 38 .  Underwent AAA surgery - complicated by AKI - now cr 1.6 last 07/2014  01/31/2016 Follow up : COPD and OSA  Pt returns for 6 month follow up .  Remains on Pulmicort Twice daily  And Duoneb As needed  (takes most days 1-2 times) .  Says overall breathing is at baseline. No flare in cough or dyspnea.  [place] 2009 utd .  Wears CPAP everynight , for 8-9hrs No mask issues. Download Shows excellent compliance with average usage at 9 hours . No AHI  Recorded.  Feels rested. Wants a new machine but is working out details with DME.  He says DME is has inaccurate info and will not give him supplies.  He is having to pay for himself.  No sign daytime sleepiness.   Past Medical History:  Diagnosis Date  . AAA (abdominal aortic aneurysm) ([place])    5/14 CT showed > 6 cm AAA, also right iliac aneurysm. Not stent graft candidate.   . Anemia of chronic disease   . Anxiety    pt. admits that he has  anxiety at times   . Aortic stenosis    Moderate to severe by echo in 4/14. Bioprosthetic #21 [facility] Ease aortic valve replacement in 4/14.   . Arthritis   . CAD (coronary artery disease)    a. 4/14 CABG: LIMA-LAD, seq SVG-ramus & OM1, SVG-PDA  . Carotid arterial disease (HCC)    Carotid dopplers (0000000) with A999333 LICA stenosis.   . CHF (congestive heart failure) ([place]) 07/2012; 10/17/2012  . Chronic kidney disease    increased creatinine recently - being followed, near kidney failure fr. contrast dye   . Chronic systolic heart failure (HCC)    a. 1/14 ECHO: sev dil LV, EF30-35%, diff HK, mild MR, AS severe, AV grad 35 b. 8/14 ECHO: EF 55-60%, mild biopros AV sten mn grad. 25, RV mild dil, RA mild dil  . Complication of anesthesia    during last surgery had to be given special medicine b/c "something dropped" during surgery   . COPD (chronic obstructive pulmonary disease) ([place])    a.  History of heavy smoking. PFTs (4/14) with mixed obstructive (COPD) and restrictive (post-ARDS) picture.   . Diabetes mellitus without complication ([place])   . Esophageal reflux   . History of blood transfusion    "lots since January" (10/17/2012)  . Hyperlipidemia   . Hypertension   .  Ischemic cardiomyopathy   . Ischemic cardiomyopathy    a. 4/14 LHC: pLAD 95, ost ramus 70-80, mLCx 90, EF 30%  . OSA (obstructive sleep apnea)    on cpap- every sleep time.   . Pneumonia 07/2012   PNA (H1N1 influenza + pneumococcus) in [id number] complicated by respiratory failure/ARDS. Required tracheostomy, now weaned off. Had left empyema requiring chest tube.   . Prediabetes   . Prostate cancer [facility])     s/p radiation treatment. - (PT. DENIES)Has indwelling foley.   . Shortness of breath    still goes deer hunting by himself   Current Outpatient Prescriptions on File Prior to Visit  Medication Sig Dispense Refill  . allopurinol (ZYLOPRIM) 100 MG tablet Take 100 mg by mouth at bedtime. Also take a 300 mg tablet during  the day    . allopurinol (ZYLOPRIM) 300 MG tablet Take 300 mg by mouth every morning.     Marland Kitchen aspirin EC 81 MG tablet Take 81 mg by mouth daily.    Marland Kitchen atorvastatin (LIPITOR) 80 MG tablet TAKE 1 TABLET BY MOUTH EVERY DAY BEFORE BREAKFAST 30 tablet 1  . bisoprolol (ZEBETA) 5 MG tablet Take 0.5 tablets (2.5 mg total) by mouth at bedtime. 90 tablet 3  . budesonide (PULMICORT) 0.25 MG/2ML nebulizer solution Take 0.25 mg by nebulization 2 (two) times daily.    . calcitRIOL (ROCALTROL) 0.25 MCG capsule Take 0.25 mcg by mouth daily.     . fexofenadine (ALLEGRA) 180 MG tablet Take 90 mg by mouth daily.     Marland Kitchen ipratropium-albuterol (DUONEB) 0.5-2.5 (3) MG/3ML SOLN Take 3 mLs by nebulization every 4 (four) hours as needed (shortness of breath).    . ivabradine (CORLANOR) 5 MG TABS tablet Take 1 tablet (5 mg total) by mouth 2 (two) times daily with a meal. 60 tablet 3  . JANUVIA 100 MG tablet Take 50 mg by mouth daily.   6  . NITROSTAT 0.4 MG SL tablet PLACE 1 TABLET UNDER TONGUE EVERY 5 MINUTES AS NEEDED FOR CHEST PAIN 25 tablet 0  . pantoprazole (PROTONIX) 40 MG tablet Take 40 mg by mouth at bedtime.     . potassium chloride (K-DUR,KLOR-CON) 10 MEQ tablet Take 2 tablets (20 mEq total) by mouth daily. 360 tablet 3  . spironolactone (ALDACTONE) 25 MG tablet Take 0.5 tablets (12.5 mg total) by mouth every evening. 15 tablet 3  . tamsulosin (FLOMAX) 0.4 MG CAPS capsule Take 0.4 mg by mouth at bedtime.     . torsemide (DEMADEX) 20 MG tablet Take 2 tablets (40 mg total) by mouth daily.     No current facility-administered medications on file prior to visit.       Review of Systems neg for any significant sore throat, dysphagia, itching, sneezing, nasal congestion or excess/ purulent secretions, fever, chills, sweats, unintended wt loss, pleuritic or exertional cp, hempoptysis, orthopnea pnd or change in chronic leg swelling. Also denies presyncope, palpitations, heartburn, abdominal pain, nausea, vomiting,  diarrhea or change in bowel or urinary habits, dysuria,hematuria, rash, arthralgias, visual complaints, headache, numbness weakness or ataxia.     Objective:   Physical Exam Vitals:   01/31/16 1154  BP: 124/64  Pulse: 69  Temp: 97.9 F (36.6 C)  TempSrc: Oral  SpO2: 93%  Weight: 230 lb (104.3 kg)  Height: 5\' 5"  (1.651 m)  Body mass index is 38.27 kg/m.    Gen. Pleasant, obese, in no distress ENT - no lesions, no post nasal drip, class 2-3 MP airway  Neck: No JVD, no thyromegaly, no carotid bruits, old trach scar healed  Lungs: no use of accessory muscles, no dullness to percussion, decreased without rales or rhonchi  Cardiovascular: Rhythm regular, heart sounds  normal, no murmurs or gallops, tr  peripheral edema Musculoskeletal: No deformities, no cyanosis or clubbing , no tremors    Tammy Parrett NP-C  [facility]  01/31/2016

## 2016-01-31 NOTE — Telephone Encounter (Signed)
Please advise 

## 2016-02-01 ENCOUNTER — Telehealth: Payer: Self-pay | Admitting: Adult Health

## 2016-02-01 NOTE — Telephone Encounter (Signed)
Budesonide is not covered by pt's insurance. PA has been started on Cover My Meds. Key: [id number]. Your information has been submitted to [place] Medicare Part D.  [employer] Part D will review the request and will issue a decision, typically within 1-3 days from your submission.  Will route to [facility] for follow up.

## 2016-02-03 ENCOUNTER — Encounter: Payer: Self-pay | Admitting: Pulmonary Disease

## 2016-02-03 ENCOUNTER — Encounter: Payer: Self-pay | Admitting: Adult Health

## 2016-02-03 NOTE — Telephone Encounter (Signed)
Pulmicort denied according to covermymeds  TP, please advise, thanks

## 2016-02-04 ENCOUNTER — Encounter: Payer: Self-pay | Admitting: Adult Health

## 2016-02-04 NOTE — Telephone Encounter (Signed)
What is covered?

## 2016-02-04 NOTE — Telephone Encounter (Signed)
See pt email dated 02/04/16. Per pt Budesonide is now covered. It was billed under wrong MCR coding. Pharmacy has corrected. Nothing further needed.

## 2016-02-10 ENCOUNTER — Encounter: Payer: Self-pay | Admitting: Adult Health

## 2016-02-14 ENCOUNTER — Ambulatory Visit (HOSPITAL_BASED_OUTPATIENT_CLINIC_OR_DEPARTMENT_OTHER)
Admission: RE | Admit: 2016-02-14 | Discharge: 2016-02-14 | Disposition: A | Discharge status: Home / Self Care | Payer: Medicare Other | Source: Ambulatory Visit | Attending: Internal Medicine | Admitting: Internal Medicine

## 2016-02-14 ENCOUNTER — Encounter (HOSPITAL_COMMUNITY): Payer: Self-pay

## 2016-02-14 ENCOUNTER — Ambulatory Visit (HOSPITAL_COMMUNITY)
Admission: RE | Admit: 2016-02-14 | Discharge: 2016-02-14 | Disposition: A | Discharge status: Home / Self Care | Payer: Medicare Other | Source: Ambulatory Visit | Attending: Cardiology | Admitting: Cardiology

## 2016-02-14 VITALS — BP 128/66 | HR 64 | Wt 225.2 lb

## 2016-02-14 DIAGNOSIS — I442 Atrioventricular block, complete: Secondary | ICD-10-CM | POA: Insufficient documentation

## 2016-02-14 DIAGNOSIS — G4733 Obstructive sleep apnea (adult) (pediatric): Secondary | ICD-10-CM | POA: Insufficient documentation

## 2016-02-14 DIAGNOSIS — I2510 Atherosclerotic heart disease of native coronary artery without angina pectoris: Secondary | ICD-10-CM | POA: Insufficient documentation

## 2016-02-14 DIAGNOSIS — N183 Chronic kidney disease, stage 3 unspecified: Secondary | ICD-10-CM

## 2016-02-14 DIAGNOSIS — Z950 Presence of cardiac pacemaker: Secondary | ICD-10-CM | POA: Diagnosis not present

## 2016-02-14 DIAGNOSIS — I509 Heart failure, unspecified: Secondary | ICD-10-CM

## 2016-02-14 DIAGNOSIS — I5042 Chronic combined systolic (congestive) and diastolic (congestive) heart failure: Secondary | ICD-10-CM

## 2016-02-14 DIAGNOSIS — Z951 Presence of aortocoronary bypass graft: Secondary | ICD-10-CM | POA: Diagnosis not present

## 2016-02-14 NOTE — Progress Notes (Signed)
  Echocardiogram 2D Echocardiogram has been performed.  Donata Clay 02/14/2016, 11:18 AM

## 2016-02-14 NOTE — Patient Instructions (Signed)
STOP Corlanor.  Follow up 3 months with Dr. [name]. We will call you closer to this time, or you may call our office to schedule 1 month before you are due to be seen.  Do the following things EVERYDAY: 1) Weigh yourself in the morning before breakfast. Write it down and keep it in a log. 2) Take your medicines as prescribed 3) Eat low salt foods-Limit salt (sodium) to 2000 mg per day.  4) Stay as active as you can everyday 5) Limit all fluids for the day to less than 2 liters

## 2016-02-15 NOTE — Progress Notes (Signed)
Advanced Heart Failure Note   Patient ID: [name], male   DOB: Apr 12, 1941, 75 y.o.   MRN: [id number] PCP: Dr. [name] Nephrologist: Dr Deterding Vascular: Dr Kellie Simmering HF: [name]  75 yo with history of ARDS/respiratory failure and tracheostomy in 07/2012 as well as DM, HTN, and COPD presented to the ER at [facility] with CHF in 10/2012. Patient had a prolonged hospitalization in 07/2012 with H1N1 influenza and pneumococcal PNA. This progressed to ARDS. He was intubated and ended up with tracheostomy. He had the trach removed. He also had a left empyema requiring chest tube, septic shock with elevated troponin, and AKI which resolved.  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   He was admitted again in 10/2012 from his nursing home with exertional dyspnea and orthopnea. He had gained 21 lbs. Echo at admission showed EF 35% with global hypokinesis that looked worse in the anteroseptal wall. He also was noted to have aortic stenosis rated as moderate to severe. He was diuresed and cathed, showing severe 3 vessel disease. He then had CABG-AVR (bioprosthetic valve).   He had a large AAA and underwent surgical repair in 07/2013.  He had AKI and Pseudomonas PNA post-operatively.  He was discharged to a nursing home. Echo in 1/15 showed EF 55-60% with mildly dilated and dysfunctional RV and a bioprosthetic aortic valve with mean gradient 32 mmHg (higher than expected).   Repeat limited echo in 08/2013 showed shower aortic valve mean gradient of 21 mmHg.   In 11/15, patient had a holter monitor placed for bradycardia.  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  Baseline bifascicular block.  His HR also was noted to drop into the 40s with exercise, suggesting exercise-associated heart block.  He underwent Medtronic-PCM placement in 1/16 by Dr Caryl Comes.   Echo in 10/16 showed fall in EF to 30% with mean aortic valve gradient 21 mmHg.  Repeat echo in 2/17 showed EF 20-25%, mean aortic valve  gradient 24 mmHg with moderately decreased RV systolic function.  Cardiolite in 10/16 showed inferior scar with no ischemia.  He is RV pacing almost all the time.  Low dose DSE was done in 2/17 to assess for significant bioprosthetic valve stenosis => mean gradient with dobutamine was only 31 mmHg, so suspect no more than moderate stenosis.  He had St Jude CRT upgrade in 3/17.  He decided against ICD.  Repeat echo post-CRT in 8/17 showed EF 50%, diffuse hypokinesis, bioprosthetic aortic valve with mean gradient 27 mmHg.    He returns HF follow up. He still has some orthostatic BP readings from home but not symptomatically dizzy or lightheaded.  No falls. No syncope.  Not a lot of energy but short of breath only with heavy exertion such as carrying a heavy load. No chest pain.  No lightheadedness. He is using CPAP.  He is exercising at the gym now, completed cardiac rehab.  He is somewhat limited by low back pain.  He is off lisinopril because of elevated creatinine.  Weight is down 1 lb.   Labs (2/15): K 4, creatinine 1.6, BNP 166 Labs (3/15): Hgb 11.6, pro-BNP 1037, creatinine 1.76, BUN 30, LDL 75, HDL 26 Labs (8/15): K 3.9, creatinine 1.55, HCT 40.6, LDL 71, HDL 27 Labs (1/16): K 4.3, Creatinine 1.6, HCT 42.9 Labs (2/17): K 4.0 Creatinine 1.64, TSH normal.  Labs (3/17): K 4.5, creatinine 1.49, HCT 42.9 Labs (4/17): K 4, creatinine 1.48, BNP 125 Labs (6/17): LDL 27, HDL 24, TGs 245  Labs (7/17): K 4.9, creatinine 1.71 Labs (8/17): K 4.3, creatinine 1.7, LDL 64, HDl 25, TGs 198  ROS: All systems negative except as listed in HPI, PMH and Problem List.  PMH: 1. PNA (H1N1 influenza + pneumococcus) in [id number] complicated by respiratory failure/ARDS. Required tracheostomy, now weaned off. Had left empyema requiring chest tube.  Has developed post-infectious pulmonary fibrosis.  2. Prostate CA s/p radiation treatment. Has indwelling foley.  3. OSA: using CPAP 4. HTN  5. Hyperlipidemia  6. COPD: History  of heavy smoking. PFTs (4/14) with mixed obstructive (COPD) and restrictive (post-ARDS) picture.  7. Type II diabetes  8. Anemia of chronic disease.  9. Ischemic cardiomyopathy:  - Echo (1/14) with severely dilated LV, EF 30-35%, diffuse hypokinesis worse in the anteroseptal wall, grade II diastolic dysfunction, mild MR, AS interpreted as "moderate to severe" with aortic valve mean gradient 23 mmHg.  - Echo (4/14) witih EF 35-40%, mid to apical anteroseptal akinesis, moderate to severe AS with mean gradient 33 mmHg, AVA 0.91 cm^2.  - cho (5/14) post CABG showed EF 50%, anteroseptal hypokinesis, bioprosthetic aortic valve well-seated. - Echo (1/15) with EF 55-60%, mild LVH, mildly dilated RV with mildly decreased systolic function, D-shaped interventricular septum, bioprosthetic aortic valve with mean gardient 32 mmHg.   - Echo (3/15) with EF 55-60%, mild LVH, bioprosthetic aortic valve with mean gradient 21 mmHg (lower), no AI, RV moderately dilated with mild to moderately decreased systolic function.   - Echo (10/16) with EF 30%, bioprosthetic aortic valve with mean gradient 21 mmHg.   - Echo (2/17) with EF 20-25%, mild LVH, aortic valve mean gradient 24 mmHg/peak 39 mmHg, RV moderately dilated with moderately decreased systolic function.  - St Jude CRT upgrade in 3/17 (did not want ICD).  - Echo (8/17) with EF 50%, diffuse hypokinesis, bioprosthetic aortic valve with mean gradient 27 mmHg, normal RV size with mildly decreased systolic function.   - AKI with lisinopril.  10. Aortic stenosis: Moderate to severe by echo in 4/14. Bioprosthetic #21 [facility] Ease aortic valve replacement in 4/14.  Low dose DSE (2/17) showed that [name] likely has no more than moderate bioprosthetic valve stenosis with mean gradient up to 31 mmHg with dobutamine.  11. CAD: LHC (4/14) with 95% pLAD, 70-80% ostial ramus, 70% mLCx, 90% pRCA, EF 35%.  - CABG 4/14 with LIMA-LAD, seq SVG-ramus and OM1, SVG-PDA.  -  Cardiolite (10/16) with EF 42%, fixed inferior defect, no ischemia.   12. Carotid stenosis: Carotid dopplers (0000000) with A999333 LICA stenosis.  Carotids (123456) with 123456 LICA stenosis. Carotids (Q000111Q) with 123456 LICA stenosis.  13. AAA: 5/14 CT showed > 6 cm AAA, also right iliac aneurysm. Not stent graft candidate.  7/14 CT showed 7 cm AAA.  Surgical repair 1/15.  14. CKD: AKI in 7/14 from contrast nephropathy.  AKI in 1/15 after AAA repair.  15. Contrast allergy.  16. ABIs 9/14 were normal.  17. Gout 18. 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  HR drops with exercise (likely exercise-induced heart block).  Medtronic dual chamber PPM placed in 2016.  Recent pacemaker interrogation showed complete heart block.  He is pacemaker-dependent, upgraded to CRT in 3/17.   SH: Quit smoking in 1/14, married, no ETOH, lives in [place].   FH: No premature CAD  ROS: All systems reviewed and negative except as per HPI.    Current Outpatient Prescriptions  Medication Sig Dispense Refill  . allopurinol (ZYLOPRIM) 100 MG  tablet Take 100 mg by mouth at bedtime. Also take a 300 mg tablet during the day    . allopurinol (ZYLOPRIM) 300 MG tablet Take 300 mg by mouth every morning.     Marland Kitchen aspirin EC 81 MG tablet Take 81 mg by mouth daily.    Marland Kitchen atorvastatin (LIPITOR) 80 MG tablet TAKE 1 TABLET BY MOUTH EVERY DAY BEFORE BREAKFAST 30 tablet 1  . bisoprolol (ZEBETA) 5 MG tablet Take 0.5 tablets (2.5 mg total) by mouth at bedtime. 90 tablet 3  . budesonide (PULMICORT) 0.25 MG/2ML nebulizer solution Take 0.25 mg by nebulization 2 (two) times daily.    . calcitRIOL (ROCALTROL) 0.25 MCG capsule Take 0.25 mcg by mouth daily.     . fexofenadine (ALLEGRA) 180 MG tablet Take 90 mg by mouth daily.     Marland Kitchen ipratropium-albuterol (DUONEB) 0.5-2.5 (3) MG/3ML SOLN Take 3 mLs by nebulization every 4 (four) hours as needed (shortness of breath).    Marland Kitchen JANUVIA 100 MG tablet Take 50 mg by mouth daily.   6   . pantoprazole (PROTONIX) 40 MG tablet Take 40 mg by mouth at bedtime.     . potassium chloride (K-DUR,KLOR-CON) 10 MEQ tablet Take 2 tablets (20 mEq total) by mouth daily. 360 tablet 3  . spironolactone (ALDACTONE) 25 MG tablet TAKE 1/2 TABLET (12.5 MG TOTAL) BY MOUTH EVERY EVENING. 15 tablet 3  . tamsulosin (FLOMAX) 0.4 MG CAPS capsule Take 0.4 mg by mouth at bedtime.     . torsemide (DEMADEX) 20 MG tablet Take 2 tablets (40 mg total) by mouth daily.    Marland Kitchen NITROSTAT 0.4 MG SL tablet PLACE 1 TABLET UNDER TONGUE EVERY 5 MINUTES AS NEEDED FOR CHEST PAIN (Patient not taking: Reported on 02/14/2016) 25 tablet 0   No current facility-administered medications for this encounter.     Vitals:   02/14/16 1121  BP: 128/66  Pulse: 64  SpO2: 98%  Weight: 225 lb 4 oz (102.2 kg)    Wt Readings from Last 3 Encounters:  02/14/16 225 lb 4 oz (102.2 kg)  01/31/16 230 lb (104.3 kg)  12/28/15 230 lb (104.3 kg)    PHYSICAL EXAM: General:  NAD, overweight, wife present HEENT: normal Neck: Thick, JVP 7 cm. Carotids 2+ bilaterally; no bruits. No thyromegaly or nodule noted.  Cor: PMI normal. RRR. No rubs, gallops. 2/6 early SEM RUSB.  Lungs: Decreased bilaterally Abdomen: Obese, NT, mildly distended, no HSM. No bruits or masses. +BS  Extremities: no cyanosis, clubbing, rash.  1+ ankle edema bilaterally.   Neuro: alert & orientedx3, cranial nerves grossly intact. Moves all 4 extremities w/o difficulty. Affect pleasant.  ASSESSMENT & PLAN: 1. CAD: Status post CABG for 3VD.  Cardiolite in 10/16 with inferior scar, no ischemia.  No chest pain.  - Continue ASA, statin, and bisoprolol.  2. Chronic systolic CHF:  Echo in [id number] showed EF down to 20-25%.  No ischemia on Cardiolite.  Constant RV pacing may have led to decline in EF.  We considered bioprosthetic valve stenosis as cause of fall in EF, but low gradient DSE did not suggest severe stenosis.  Repeat echo today after CRT upgrade shows improved EF to 50%.   NYHA class II, volume looks ok.   - Continue torsemide 40 mg daily.  - AKI with lisinopril, will leave off.  - Continue bisoprolol 2.5 mg twice a day. Will not increase bisoprolol with ongoing orthostatic BP drops though asymptomatic.  I think we can stop ivabradine at this point.  -  Continue spironolactone 12.5 mg qhs.  3. 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 Suspect that intrinsic lung disease (post-infectious pulmonary fibrosis) is a contributor to his dyspnea and hypoxemia. He is now off oxygen (just using CPAP at night).   4. Carotid stenosis: Followed at [facility]. 5. Hyperlipidemia: Continue statin.  Good LDL recently.  6. AAA: s/p surgical repair. Stable on CT 08/11/14.  7. Status post bioprosthetic AVR: Gradient across the aortic valve has been elevated. There was some concern for possible low gradient severe bioprosthetic aortic stenosis based on most recent echo, but recent low dose DSE suggests that valvular stenosis is no more than moderate.  Mean gradient 27 mmHg across bioprosthetic valve on echo today, stable.  - He will need antibiotic prophylaxis with dental work.  8. CKD: Creatinine stable at 1.7. 9. 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  S/p Medtronic PCM 1/16.   Patient is pacemaker-dependent with complete heart block.  He had upgrade to CRT in 3/17.   10. OSA: He is on CPAP.    Loralie Champagne 02/15/2016

## 2016-02-18 ENCOUNTER — Encounter: Payer: Self-pay | Admitting: Adult Health

## 2016-02-25 ENCOUNTER — Other Ambulatory Visit: Payer: Self-pay | Admitting: Cardiology

## 2016-03-06 ENCOUNTER — Encounter: Payer: Self-pay | Admitting: Pulmonary Disease

## 2016-03-06 ENCOUNTER — Encounter: Payer: Self-pay | Admitting: Internal Medicine

## 2016-03-28 ENCOUNTER — Encounter: Payer: Self-pay | Admitting: Internal Medicine

## 2016-03-28 ENCOUNTER — Ambulatory Visit (INDEPENDENT_AMBULATORY_CARE_PROVIDER_SITE_OTHER): Payer: Medicare Other | Admitting: *Deleted

## 2016-03-28 DIAGNOSIS — I442 Atrioventricular block, complete: Secondary | ICD-10-CM | POA: Diagnosis not present

## 2016-03-28 NOTE — Progress Notes (Signed)
Remote pacemaker transmission.   

## 2016-03-29 ENCOUNTER — Encounter: Payer: Self-pay | Admitting: Cardiology

## 2016-04-25 ENCOUNTER — Other Ambulatory Visit: Payer: Self-pay | Admitting: Internal Medicine

## 2016-04-25 LAB — CUP PACEART REMOTE DEVICE CHECK
Battery Remaining Longevity: 75 mo
Brady Statistic AP VP Percent: 4.4 %
Brady Statistic AP VS Percent: 1 %
Brady Statistic RA Percent Paced: 3.9 %
Implantable Lead Implant Date: 20160113
Implantable Lead Implant Date: 20170322
Implantable Lead Location: 753858
Implantable Lead Location: 753860
Implantable Lead Model: 5076
Lead Channel Impedance Value: 460 Ohm
Lead Channel Impedance Value: 530 Ohm
Lead Channel Impedance Value: 590 Ohm
Lead Channel Pacing Threshold Pulse Width: 0.4 ms
Lead Channel Sensing Intrinsic Amplitude: 7.8 mV
Lead Channel Setting Pacing Amplitude: 3.125
MDC IDC LEAD IMPLANT DT: 20160113
MDC IDC LEAD LOCATION: 753859
MDC IDC MSMT BATTERY REMAINING PERCENTAGE: 95.5 %
MDC IDC MSMT BATTERY VOLTAGE: 2.98 V
MDC IDC MSMT LEADCHNL LV PACING THRESHOLD AMPLITUDE: 2.125 V
MDC IDC MSMT LEADCHNL LV PACING THRESHOLD PULSEWIDTH: 0.4 ms
MDC IDC MSMT LEADCHNL RA PACING THRESHOLD AMPLITUDE: 0.625 V
MDC IDC MSMT LEADCHNL RA SENSING INTR AMPL: 3 mV
MDC IDC MSMT LEADCHNL RV PACING THRESHOLD AMPLITUDE: 0.5 V
MDC IDC MSMT LEADCHNL RV PACING THRESHOLD PULSEWIDTH: 0.4 ms
MDC IDC PG SERIAL: 7838612
MDC IDC SESS DTM: 20170926133622
MDC IDC SET LEADCHNL LV PACING PULSEWIDTH: 0.4 ms
MDC IDC SET LEADCHNL RA PACING AMPLITUDE: 1.625
MDC IDC SET LEADCHNL RV PACING AMPLITUDE: 2.5 V
MDC IDC SET LEADCHNL RV PACING PULSEWIDTH: 0.4 ms
MDC IDC SET LEADCHNL RV SENSING SENSITIVITY: 5 mV
MDC IDC STAT BRADY AS VP PERCENT: 95 %
MDC IDC STAT BRADY AS VS PERCENT: 1 %
Pulse Gen Model: 3262

## 2016-05-05 ENCOUNTER — Telehealth: Payer: Self-pay | Admitting: Pulmonary Disease

## 2016-05-05 DIAGNOSIS — J449 Chronic obstructive pulmonary disease, unspecified: Secondary | ICD-10-CM

## 2016-05-05 NOTE — Telephone Encounter (Signed)
Pt returned call. Informed pt that the order for a new neb machine has been ordered and the order is now pending. Pt is requesting this be taken care of today because he needs his neb meds this weekend.   PCC's please advise if this can done today. Thanks.

## 2016-05-05 NOTE — Telephone Encounter (Signed)
Patient returned my call.  I advised him of Estill Bamberg [email or web address]  here rec's.  Pt will call the [facility] office.

## 2016-05-05 NOTE — Telephone Encounter (Signed)
lmtcb for pt.  

## 2016-05-05 NOTE — Telephone Encounter (Signed)
I called the pt back and lmom to make him aware that we are going to send in the new order for the nebulizer machine.

## 2016-05-05 NOTE — Telephone Encounter (Signed)
Patient called back - pr

## 2016-05-05 NOTE — Telephone Encounter (Signed)
lmomtcb x 2  

## 2016-05-05 NOTE — Telephone Encounter (Signed)
I called & spoke with [name] @[email or web address] .  She is going to get this Order over to their [place] office.  She advised to have the pt check [facility] in approx 30-45 mins.    I tried to reach pt to make him aware.  LMOM to call me

## 2016-05-05 NOTE — Telephone Encounter (Signed)
Patient returning call - can be reached at [phone]

## 2016-05-08 ENCOUNTER — Telehealth (HOSPITAL_COMMUNITY): Payer: Self-pay | Admitting: Vascular Surgery

## 2016-05-08 NOTE — Telephone Encounter (Signed)
Pt left VM , returned pt call

## 2016-05-18 ENCOUNTER — Other Ambulatory Visit (HOSPITAL_COMMUNITY): Payer: Self-pay | Admitting: Cardiology

## 2016-05-23 ENCOUNTER — Other Ambulatory Visit (HOSPITAL_COMMUNITY): Payer: Self-pay | Admitting: Cardiology

## 2016-05-27 ENCOUNTER — Other Ambulatory Visit: Payer: Self-pay | Admitting: Pulmonary Disease

## 2016-05-30 ENCOUNTER — Telehealth: Payer: Self-pay | Admitting: Pulmonary Disease

## 2016-05-30 NOTE — Telephone Encounter (Signed)
lmomtcb x1 

## 2016-05-30 NOTE — Telephone Encounter (Signed)
Patient is returning call, states he is having problems with his phone, CB is [phone].

## 2016-05-30 NOTE — Telephone Encounter (Signed)
Left detailed message for pt to call back with the name of the medication he needs refilled, just to be sure, and the pharmacy that it needs to be sent to.

## 2016-05-31 ENCOUNTER — Telehealth (HOSPITAL_COMMUNITY): Payer: Self-pay | Admitting: *Deleted

## 2016-05-31 ENCOUNTER — Encounter (HOSPITAL_COMMUNITY): Payer: Self-pay | Admitting: *Deleted

## 2016-05-31 NOTE — Telephone Encounter (Signed)
Dr Kalman Jewels office called stating that Dr Comer Locket had spoken w/Dr [name] via phone regarding pt needing surgery and that Dr [name] had given clearance, however they need this in writing.  Spoke w/Dr [name] via phone he states pt is cleared to proceed with surgery.  Note done and faxed to them at [phone]

## 2016-06-02 NOTE — Telephone Encounter (Signed)
Spoke with pt. And he was set up with a follow up appointment with Dr. Elsworth Soho for in Jan. Nothing further needed at this time. Also a refill of his medication was sent already according to chart review.

## 2016-06-02 NOTE — Telephone Encounter (Signed)
lmomtcb x 3  

## 2016-06-16 ENCOUNTER — Ambulatory Visit (HOSPITAL_COMMUNITY)
Admission: RE | Admit: 2016-06-16 | Discharge: 2016-06-16 | Disposition: A | Discharge status: Home / Self Care | Payer: Medicare Other | Source: Ambulatory Visit | Attending: Cardiology | Admitting: Cardiology

## 2016-06-16 VITALS — BP 110/74 | HR 85 | Wt 226.8 lb

## 2016-06-16 DIAGNOSIS — I5042 Chronic combined systolic (congestive) and diastolic (congestive) heart failure: Secondary | ICD-10-CM

## 2016-06-16 DIAGNOSIS — Z79899 Other long term (current) drug therapy: Secondary | ICD-10-CM | POA: Diagnosis not present

## 2016-06-16 DIAGNOSIS — I2510 Atherosclerotic heart disease of native coronary artery without angina pectoris: Secondary | ICD-10-CM | POA: Diagnosis not present

## 2016-06-16 DIAGNOSIS — J80 Acute respiratory distress syndrome: Secondary | ICD-10-CM | POA: Diagnosis not present

## 2016-06-16 DIAGNOSIS — G4733 Obstructive sleep apnea (adult) (pediatric): Secondary | ICD-10-CM | POA: Diagnosis not present

## 2016-06-16 DIAGNOSIS — I714 Abdominal aortic aneurysm, without rupture: Secondary | ICD-10-CM | POA: Insufficient documentation

## 2016-06-16 DIAGNOSIS — I442 Atrioventricular block, complete: Secondary | ICD-10-CM | POA: Insufficient documentation

## 2016-06-16 DIAGNOSIS — Z951 Presence of aortocoronary bypass graft: Secondary | ICD-10-CM | POA: Diagnosis not present

## 2016-06-16 DIAGNOSIS — Z953 Presence of xenogenic heart valve: Secondary | ICD-10-CM | POA: Insufficient documentation

## 2016-06-16 DIAGNOSIS — Z87891 Personal history of nicotine dependence: Secondary | ICD-10-CM | POA: Diagnosis not present

## 2016-06-16 DIAGNOSIS — I6529 Occlusion and stenosis of unspecified carotid artery: Secondary | ICD-10-CM | POA: Diagnosis not present

## 2016-06-16 DIAGNOSIS — Z7982 Long term (current) use of aspirin: Secondary | ICD-10-CM | POA: Diagnosis not present

## 2016-06-16 DIAGNOSIS — N183 Chronic kidney disease, stage 3 unspecified: Secondary | ICD-10-CM

## 2016-06-16 DIAGNOSIS — I509 Heart failure, unspecified: Secondary | ICD-10-CM | POA: Diagnosis not present

## 2016-06-16 DIAGNOSIS — E785 Hyperlipidemia, unspecified: Secondary | ICD-10-CM | POA: Diagnosis not present

## 2016-06-16 DIAGNOSIS — I130 Hypertensive heart and chronic kidney disease with heart failure and stage 1 through stage 4 chronic kidney disease, or unspecified chronic kidney disease: Secondary | ICD-10-CM | POA: Diagnosis not present

## 2016-06-16 DIAGNOSIS — Z950 Presence of cardiac pacemaker: Secondary | ICD-10-CM | POA: Insufficient documentation

## 2016-06-16 DIAGNOSIS — J449 Chronic obstructive pulmonary disease, unspecified: Secondary | ICD-10-CM | POA: Insufficient documentation

## 2016-06-16 DIAGNOSIS — E1122 Type 2 diabetes mellitus with diabetic chronic kidney disease: Secondary | ICD-10-CM | POA: Diagnosis not present

## 2016-06-16 DIAGNOSIS — I5022 Chronic systolic (congestive) heart failure: Secondary | ICD-10-CM | POA: Diagnosis present

## 2016-06-16 LAB — BASIC METABOLIC PANEL
Anion gap: 10 (ref 5–15)
BUN: 43 mg/dL — AB (ref 6–20)
CALCIUM: 8.9 mg/dL (ref 8.9–10.3)
CO2: 22 mmol/L (ref 22–32)
CREATININE: 1.84 mg/dL — AB (ref 0.61–1.24)
Chloride: 104 mmol/L (ref 101–111)
GFR calc Af Amer: 40 mL/min — ABNORMAL LOW (ref >60)
GFR, EST NON AFRICAN AMERICAN: 34 mL/min — AB (ref >60)
GLUCOSE: 205 mg/dL — AB (ref 65–99)
Potassium: 4.9 mmol/L (ref 3.5–5.1)
Sodium: 136 mmol/L (ref 135–145)

## 2016-06-16 LAB — BRAIN NATRIURETIC PEPTIDE: B Natriuretic Peptide: 163.4 pg/mL — ABNORMAL HIGH (ref 0.0–100.0)

## 2016-06-16 MED ORDER — ATORVASTATIN CALCIUM 80 MG PO TABS: ORAL_TABLET | ORAL | 3 refills | Status: DC

## 2016-06-16 NOTE — Patient Instructions (Signed)
Labs today (will call for abnormal results, otherwise no news is good news)  START taking Atorvastatin 80 mg (1 Tab) Once Daily  Follow up in 4 months

## 2016-06-17 NOTE — Progress Notes (Signed)
Advanced Heart Failure Note   Patient ID: [name], male   DOB: Feb 18, 1941, 75 y.o.   MRN: [id number] PCP: Dr. [name] Nephrologist: Dr Deterding Vascular: Dr Kellie Simmering HF: [name]  75 yo with history of ARDS/respiratory failure and tracheostomy in 07/2012 as well as DM, HTN, and COPD presented to the ER at [facility] with CHF in 10/2012. Patient had a prolonged hospitalization in 07/2012 with H1N1 influenza and pneumococcal PNA. This progressed to ARDS. He was intubated and ended up with tracheostomy. He had the trach removed. He also had a left empyema requiring chest tube, septic shock with elevated troponin, and AKI which resolved.  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   He was admitted again in 10/2012 from his nursing home with exertional dyspnea and orthopnea. He had gained 21 lbs. Echo at admission showed EF 35% with global hypokinesis that looked worse in the anteroseptal wall. He also was noted to have aortic stenosis rated as moderate to severe. He was diuresed and cathed, showing severe 3 vessel disease. He then had CABG-AVR (bioprosthetic valve).   He had a large AAA and underwent surgical repair in 07/2013.  He had AKI and Pseudomonas PNA post-operatively.  He was discharged to a nursing home. Echo in 1/15 showed EF 55-60% with mildly dilated and dysfunctional RV and a bioprosthetic aortic valve with mean gradient 32 mmHg (higher than expected).   Repeat limited echo in 08/2013 showed shower aortic valve mean gradient of 21 mmHg.   In 11/15, patient had a holter monitor placed for bradycardia.  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  Baseline bifascicular block.  His HR also was noted to drop into the 40s with exercise, suggesting exercise-associated heart block.  He underwent Medtronic-PCM placement in 1/16 by Dr Caryl Comes.   Echo in 10/16 showed fall in EF to 30% with mean aortic valve gradient 21 mmHg.  Repeat echo in 2/17 showed EF 20-25%, mean aortic valve  gradient 24 mmHg with moderately decreased RV systolic function.  Cardiolite in 10/16 showed inferior scar with no ischemia.  He is RV pacing almost all the time.  Low dose DSE was done in 2/17 to assess for significant bioprosthetic valve stenosis => mean gradient with dobutamine was only 31 mmHg, so suspect no more than moderate stenosis.  He had St Jude CRT upgrade in 3/17.  He decided against ICD.  Repeat echo post-CRT in 8/17 showed EF 50%, diffuse hypokinesis, bioprosthetic aortic valve with mean gradient 27 mmHg.    He returns HF follow up. He seems to be doing ok.  No lightheadedness. No falls. No syncope.  Not a lot of energy but short of breath only with heavy exertion such as carrying a heavy load. No chest pain.  No lightheadedness. He is using CPAP.  He is exercising at the gym now, completed cardiac rehab.  He is limited by low back pain.  He is off lisinopril because of elevated creatinine.  Weight is stable.  He has a UTI currently, had procedure recently for BPH.  He is on antibiotic per urology.    Labs (2/15): K 4, creatinine 1.6, BNP 166 Labs (3/15): Hgb 11.6, pro-BNP 1037, creatinine 1.76, BUN 30, LDL 75, HDL 26 Labs (8/15): K 3.9, creatinine 1.55, HCT 40.6, LDL 71, HDL 27 Labs (1/16): K 4.3, Creatinine 1.6, HCT 42.9 Labs (2/17): K 4.0 Creatinine 1.64, TSH normal.  Labs (3/17): K 4.5, creatinine 1.49, HCT 42.9 Labs (4/17): K 4, creatinine 1.48, BNP  125 Labs (6/17): LDL 27, HDL 24, TGs 245 Labs (7/17): K 4.9, creatinine 1.71 Labs (8/17): K 4.3, creatinine 1.7, LDL 64, HDl 25, TGs 198  1. PNA (H1N1 influenza + pneumococcus) in 3/15 complicated by respiratory failure/ARDS. Required tracheostomy, now weaned off. Had left empyema requiring chest tube.  Has developed post-infectious pulmonary fibrosis.  2. Prostate CA s/p radiation treatment. Has indwelling foley.  3. OSA: using CPAP 4. HTN  5. Hyperlipidemia  6. COPD: History of heavy smoking. PFTs (4/14) with mixed obstructive  (COPD) and restrictive (post-ARDS) picture.  7. Type II diabetes  8. Anemia of chronic disease.  9. Ischemic cardiomyopathy:  - Echo (1/14) with severely dilated LV, EF 30-35%, diffuse hypokinesis worse in the anteroseptal wall, grade II diastolic dysfunction, mild MR, AS interpreted as "moderate to severe" with aortic valve mean gradient 23 mmHg.  - Echo (4/14) witih EF 35-40%, mid to apical anteroseptal akinesis, moderate to severe AS with mean gradient 33 mmHg, AVA 0.91 cm^2.  -Echo (5/14) post CABG showed EF 50%, anteroseptal hypokinesis, bioprosthetic aortic valve well-seated. - Echo (1/15) with EF 55-60%, mild LVH, mildly dilated RV with mildly decreased systolic function, D-shaped interventricular septum, bioprosthetic aortic valve with mean gardient 32 mmHg.   - Echo (3/15) with EF 55-60%, mild LVH, bioprosthetic aortic valve with mean gradient 21 mmHg (lower), no AI, RV moderately dilated with mild to moderately decreased systolic function.   - Echo (10/16) with EF 30%, bioprosthetic aortic valve with mean gradient 21 mmHg.   - Echo (2/17) with EF 20-25%, mild LVH, aortic valve mean gradient 24 mmHg/peak 39 mmHg, RV moderately dilated with moderately decreased systolic function.  - St Jude CRT upgrade in 3/17 (did not want ICD).  - Echo (8/17) with EF 50%, diffuse hypokinesis, bioprosthetic aortic valve with mean gradient 27 mmHg, normal RV size with mildly decreased systolic function.   - AKI with lisinopril.  10. Aortic stenosis: Moderate to severe by echo in 4/14. Bioprosthetic #21 [facility] Ease aortic valve replacement in 4/14.  Low dose DSE (2/17) showed that Mr Mestre likely has no more than moderate bioprosthetic valve stenosis with mean gradient up to 31 mmHg with dobutamine.  11. CAD: LHC (4/14) with 95% pLAD, 70-80% ostial ramus, 70% mLCx, 90% pRCA, EF 35%.  - CABG 4/14 with LIMA-LAD, seq SVG-ramus and OM1, SVG-PDA.  - Cardiolite (10/16) with EF 42%, fixed inferior defect, no  ischemia.   12. Carotid stenosis: Carotid dopplers (1/74) with 16-07% LICA stenosis.  Carotids (3/71) with 06-26% LICA stenosis. Carotids (9/48) with 54-62% LICA stenosis.  13. AAA: 5/14 CT showed > 6 cm AAA, also right iliac aneurysm. Not stent graft candidate.  7/14 CT showed 7 cm AAA.  Surgical repair 1/15.  14. CKD: AKI in 7/14 from contrast nephropathy.  AKI in 1/15 after AAA repair.  15. Contrast allergy.  16. ABIs 9/14 were normal.  17. Gout 18. 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  HR drops with exercise (likely exercise-induced heart block).  Medtronic dual chamber PPM placed in 2016.  Recent pacemaker interrogation showed complete heart block.  He is pacemaker-dependent, upgraded to CRT in 3/17.   SH: Quit smoking in 1/14, married, no ETOH, lives in [place].   FH: No premature CAD  ROS: All systems reviewed and negative except as per HPI.    Current Outpatient Prescriptions  Medication Sig Dispense Refill  . allopurinol (ZYLOPRIM) 100 MG tablet Take 100 mg by mouth at  bedtime. Also take a 300 mg tablet during the day    . allopurinol (ZYLOPRIM) 300 MG tablet Take 300 mg by mouth every morning.     Marland Kitchen aspirin EC 81 MG tablet Take 81 mg by mouth daily.    Marland Kitchen atorvastatin (LIPITOR) 80 MG tablet TAKE 1 TABLET BY MOUTH EVERY DAY BEFORE BREAKFAST 30 tablet 3  . bisoprolol (ZEBETA) 5 MG tablet Take 0.5 tablets (2.5 mg total) by mouth at bedtime. 90 tablet 3  . budesonide (PULMICORT) 0.25 MG/2ML nebulizer solution Take 0.25 mg by nebulization 2 (two) times daily.    . calcitRIOL (ROCALTROL) 0.25 MCG capsule Take 0.25 mcg by mouth daily.     Marland Kitchen doxycycline (VIBRA-TABS) 100 MG tablet Take 100 mg by mouth 2 (two) times daily.    . fexofenadine (ALLEGRA) 180 MG tablet Take 90 mg by mouth daily.     Marland Kitchen ipratropium-albuterol (DUONEB) 0.5-2.5 (3) MG/3ML SOLN TAKE 3 MLS BY NEBULIZATION EVERY 4 (FOUR) HOURS AS NEEDED. 360 mL 5  . JANUVIA 100 MG tablet Take 50 mg by  mouth daily.   6  . NITROSTAT 0.4 MG SL tablet PLACE 1 TABLET UNDER TONGUE EVERY 5 MINUTES AS NEEDED FOR CHEST PAIN 25 tablet 0  . pantoprazole (PROTONIX) 40 MG tablet Take 40 mg by mouth at bedtime.     . potassium chloride (K-DUR,KLOR-CON) 10 MEQ tablet Take 2 tablets (20 mEq total) by mouth daily. 360 tablet 3  . spironolactone (ALDACTONE) 25 MG tablet TAKE 1/2 TABLET (12.5 MG TOTAL) BY MOUTH EVERY EVENING. 15 tablet 3  . tamsulosin (FLOMAX) 0.4 MG CAPS capsule Take 0.4 mg by mouth at bedtime.     . torsemide (DEMADEX) 20 MG tablet Take 2 tablets (40 mg total) by mouth daily. 90 tablet 3   No current facility-administered medications for this encounter.     Vitals:   06/16/16 1150  BP: 110/74  Pulse: 85  SpO2: 96%  Weight: 226 lb 12 oz (102.9 kg)    Wt Readings from Last 3 Encounters:  06/16/16 226 lb 12 oz (102.9 kg)  02/14/16 225 lb 4 oz (102.2 kg)  01/31/16 230 lb (104.3 kg)    PHYSICAL EXAM: General:  NAD, overweight, wife present HEENT: normal Neck: Thick, JVP 7 cm. Carotids 2+ bilaterally; no bruits. No thyromegaly or nodule noted.  Cor: PMI normal. RRR. No rubs, gallops. 2/6 early SEM RUSB.  Lungs: Decreased bilaterally Abdomen: Obese, NT, mildly distended, no HSM. No bruits or masses. +BS  Extremities: no cyanosis, clubbing, rash.  No edema.   Neuro: alert & orientedx3, cranial nerves grossly intact. Moves all 4 extremities w/o difficulty. Affect pleasant.  ASSESSMENT & PLAN: 1. CAD: Status post CABG for 3VD.  Cardiolite in 10/16 with inferior scar, no ischemia.  No chest pain.  - Continue ASA, statin, and bisoprolol.  2. Chronic systolic CHF:  Echo in 6/01 showed EF down to 20-25%.  No ischemia on Cardiolite.  Constant RV pacing may have led to decline in EF.  We considered bioprosthetic valve stenosis as cause of fall in EF, but low gradient DSE did not suggest severe stenosis.  Repeat echo today after CRT upgrade shows improved EF to 50%.  NYHA class II, volume  looks ok.   - Continue torsemide 40 mg daily. BMET/BNP today.  - AKI with lisinopril, will leave off.  - Continue bisoprolol 2.5 mg twice a day. Will not increase bisoprolol with ongoing orthostatic BP drops though asymptomatic.  - Continue spironolactone  12.5 mg qhs.  3. 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 Suspect that intrinsic lung disease (post-infectious pulmonary fibrosis) is a contributor to his dyspnea and hypoxemia. He is now off oxygen (just using CPAP at night).   4. Carotid stenosis: Followed at [facility], repeat dopplers 2/18. 5. Hyperlipidemia: Continue statin.  Good LDL recently.  6. AAA: s/p surgical repair. Stable on CT 08/11/14.  7. Status post bioprosthetic AVR: Gradient across the aortic valve has been elevated. There was some concern for possible low gradient severe bioprosthetic aortic stenosis based on most recent echo, but recent low dose DSE suggests that valvular stenosis is no more than moderate.  Mean gradient 27 mmHg across bioprosthetic valve on echo today, stable.  - He will need antibiotic prophylaxis with dental work.  8. CKD: Stage III.  BMET today.  9. 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  S/p Medtronic PCM 1/16.   Patient is pacemaker-dependent with complete heart block.  He had upgrade to CRT in 3/17.   10. OSA: He is on CPAP.    Loralie Champagne 06/17/2016

## 2016-06-27 ENCOUNTER — Ambulatory Visit (INDEPENDENT_AMBULATORY_CARE_PROVIDER_SITE_OTHER): Payer: Medicare Other | Admitting: *Deleted

## 2016-06-27 DIAGNOSIS — I442 Atrioventricular block, complete: Secondary | ICD-10-CM

## 2016-06-28 NOTE — Progress Notes (Signed)
Remote pacemaker transmission.   

## 2016-06-29 LAB — CUP PACEART REMOTE DEVICE CHECK
Battery Remaining Longevity: 69 mo
Battery Remaining Percentage: 91 %
Brady Statistic AP VS Percent: 1 %
Brady Statistic AS VP Percent: 96 %
Date Time Interrogation Session: 20171226122902
Implantable Lead Implant Date: 20160113
Implantable Lead Implant Date: 20170322
Implantable Lead Location: 753859
Lead Channel Impedance Value: 440 Ohm
Lead Channel Impedance Value: 530 Ohm
Lead Channel Pacing Threshold Pulse Width: 0.4 ms
Lead Channel Pacing Threshold Pulse Width: 0.4 ms
Lead Channel Sensing Intrinsic Amplitude: 12 mV
Lead Channel Sensing Intrinsic Amplitude: 3.4 mV
Lead Channel Setting Pacing Amplitude: 1.5 V
Lead Channel Setting Pacing Amplitude: 2.5 V
Lead Channel Setting Pacing Pulse Width: 0.4 ms
MDC IDC LEAD IMPLANT DT: 20160113
MDC IDC LEAD LOCATION: 753858
MDC IDC LEAD LOCATION: 753860
MDC IDC MSMT BATTERY VOLTAGE: 2.96 V
MDC IDC MSMT LEADCHNL LV IMPEDANCE VALUE: 640 Ohm
MDC IDC MSMT LEADCHNL LV PACING THRESHOLD AMPLITUDE: 2.375 V
MDC IDC MSMT LEADCHNL RA PACING THRESHOLD AMPLITUDE: 0.5 V
MDC IDC MSMT LEADCHNL RA PACING THRESHOLD PULSEWIDTH: 0.4 ms
MDC IDC MSMT LEADCHNL RV PACING THRESHOLD AMPLITUDE: 0.5 V
MDC IDC PG IMPLANT DT: 20170322
MDC IDC PG SERIAL: 7838612
MDC IDC SET LEADCHNL LV PACING AMPLITUDE: 3.375
MDC IDC SET LEADCHNL LV PACING PULSEWIDTH: 0.4 ms
MDC IDC SET LEADCHNL RV SENSING SENSITIVITY: 5 mV
MDC IDC STAT BRADY AP VP PERCENT: 2.9 %
MDC IDC STAT BRADY AS VS PERCENT: 1 %
MDC IDC STAT BRADY RA PERCENT PACED: 2.6 %
Pulse Gen Model: 3262

## 2016-06-30 ENCOUNTER — Encounter: Payer: Self-pay | Admitting: Cardiology

## 2016-06-30 ENCOUNTER — Encounter: Payer: Self-pay | Admitting: Internal Medicine

## 2016-07-04 DIAGNOSIS — N318 Other neuromuscular dysfunction of bladder: Secondary | ICD-10-CM | POA: Diagnosis not present

## 2016-07-04 DIAGNOSIS — N3000 Acute cystitis without hematuria: Secondary | ICD-10-CM | POA: Diagnosis not present

## 2016-07-04 DIAGNOSIS — R972 Elevated prostate specific antigen [PSA]: Secondary | ICD-10-CM | POA: Diagnosis not present

## 2016-07-04 DIAGNOSIS — N451 Epididymitis: Secondary | ICD-10-CM | POA: Diagnosis not present

## 2016-07-13 ENCOUNTER — Ambulatory Visit (INDEPENDENT_AMBULATORY_CARE_PROVIDER_SITE_OTHER): Payer: Medicare Other | Admitting: Pulmonary Disease

## 2016-07-13 ENCOUNTER — Encounter: Payer: Self-pay | Admitting: Pulmonary Disease

## 2016-07-13 DIAGNOSIS — G4733 Obstructive sleep apnea (adult) (pediatric): Secondary | ICD-10-CM | POA: Diagnosis not present

## 2016-07-13 DIAGNOSIS — J449 Chronic obstructive pulmonary disease, unspecified: Secondary | ICD-10-CM

## 2016-07-13 MED ORDER — FLUTICASONE-SALMETEROL 113-14 MCG/ACT IN AEPB: 1.0000 | INHALATION_SPRAY | Freq: Two times a day (BID) | RESPIRATORY_TRACT | 0 refills | Status: DC

## 2016-07-13 NOTE — Progress Notes (Signed)
   Subjective:    Patient ID: [name], male    DOB: Mar 21, 1941, 76 y.o.   MRN: [id number]  HPI  76 yo ex-heavy smoker for FU of COPD & OSA. He smoked 2-3 PPD until he quit in 07/2012  He had prolonged hospitalization in 07/2012 with H1N1 influenza and pneumococcal PNA/ ARDS requiring tracheostomy. He also had a left empyema requiring chest tube, septic shock and AKI which resolved.   He had CABG-AVR (bioprosthetic valve).   07/13/2016  Chief Complaint  Patient presents with  . Follow-up    6 month follow up. Breathing has been ok. Occasional SOB. Denies any chest pain.      75m FU He remains on budesonide nebulizer twice daily. & duonebs 3 times a day. He was given a trial of Breo in 2014 , but was unable to afford this inhaler. He is ready to try a different type of inhaler- his wife feels his ambulation is limited by dyspnea, he states that he is more limited by hip pain  He got a new CPAP machine and this is working well except for small leak He feels rested in the morning when he wakes up  Significant tests/ events  10/23/12 PFTs reveal severe obstructive / restrictive disease & severely reduced DLCO.   PFT 01/21/13 >FEV1 58%, ratio 58, +25% change after BD , DLCO 38 .    Review of Systems Pt denies any significant  nasal congestion or excess secretions, fever, chills, sweats, unintended wt loss, pleuritic or exertional cp, orthopnea pnd or leg swelling.  Pt also denies any obvious fluctuation in symptoms with weather or environmental change or other alleviating or aggravating factors.     Pt denies any increase in rescue therapy over baseline, denies waking up needing it or having early am exacerbations or coughing/wheezing/ or dyspnea      Objective:   Physical Exam  Gen. Pleasant, obese, in no distress ENT - no lesions, no post nasal drip Neck: No JVD, no thyromegaly, no carotid bruits Lungs: no use of accessory muscles, no dullness to percussion,  decreased without rales or rhonchi  Cardiovascular: Rhythm regular, heart sounds  normal, no murmurs or gallops, no peripheral edema Musculoskeletal: No deformities, no cyanosis or clubbing , no tremors       Assessment & Plan:

## 2016-07-13 NOTE — Addendum Note (Signed)
Addended by: Valerie Salts on: 07/13/2016 12:24 PM   Modules accepted: Orders

## 2016-07-13 NOTE — Assessment & Plan Note (Signed)
  CPAP download will be checked for leak  Weight loss encouraged, compliance with goal of at least 4-6 hrs every night is the expectation. Advised against medications with sedative side effects Cautioned against driving when sleepy - understanding that sleepiness will vary on a day to day basis

## 2016-07-13 NOTE — Assessment & Plan Note (Signed)
Trial of AIRDUO respiclick one puff twice daily Call back for prescription of this works  Alternatives for you would include ANORO, [place], [place] or DULERA  Please find out which one is covered by her insurance and we can prescribe accordingly   Used Duonebs twice daily as needed

## 2016-07-13 NOTE — Patient Instructions (Signed)
Trial of AIRDUO respiclick one puff twice daily Call back for prescription of this works  Alternatives for you would include ANORO, [place], [place] or DULERA  Please find out which one is covered by her insurance and we can prescribe accordingly   Used Duonebs twice daily as needed  CPAP download will be checked for leak

## 2016-08-03 ENCOUNTER — Telehealth: Payer: Self-pay | Admitting: Pulmonary Disease

## 2016-08-03 NOTE — Telephone Encounter (Signed)
Received fax from [facility]. Current formulary alternatives include advair hfa and advair diskus.

## 2016-08-03 NOTE — Telephone Encounter (Signed)
Pl send RX for ANORO 1 puff daily X 3 refills

## 2016-08-03 NOTE — Telephone Encounter (Signed)
Spoke with Chioma with [facility], who states PA is needed for airduo. Chioma states anoro & symbicort are on pt's medication formulary.  Chioma states pt had requested refills on this rx, therefore it will be denied.  Per RA's OV alternatives would be anoro, symbicort, stiolto or dulera. I have LM to make pt aware of this.  RA please advise on which alternative you would like.

## 2016-08-04 MED ORDER — UMECLIDINIUM-VILANTEROL 62.5-25 MCG/INH IN AEPB: 1.0000 | INHALATION_SPRAY | Freq: Every day | RESPIRATORY_TRACT | 3 refills | Status: DC

## 2016-08-04 NOTE — Telephone Encounter (Signed)
Patient returning our call - he can be reached at [phone] -pr

## 2016-08-04 NOTE — Telephone Encounter (Signed)
Called and spoke with pt and he is aware of the anoro that has been sent.   Pt will call back if needed.

## 2016-08-09 DIAGNOSIS — E119 Type 2 diabetes mellitus without complications: Secondary | ICD-10-CM | POA: Diagnosis not present

## 2016-08-09 DIAGNOSIS — N183 Chronic kidney disease, stage 3 (moderate): Secondary | ICD-10-CM | POA: Diagnosis not present

## 2016-08-09 DIAGNOSIS — M545 Low back pain: Secondary | ICD-10-CM | POA: Diagnosis not present

## 2016-08-09 DIAGNOSIS — B351 Tinea unguium: Secondary | ICD-10-CM | POA: Diagnosis not present

## 2016-08-09 DIAGNOSIS — I119 Hypertensive heart disease without heart failure: Secondary | ICD-10-CM | POA: Diagnosis not present

## 2016-08-09 DIAGNOSIS — M109 Gout, unspecified: Secondary | ICD-10-CM | POA: Diagnosis not present

## 2016-08-09 DIAGNOSIS — I2510 Atherosclerotic heart disease of native coronary artery without angina pectoris: Secondary | ICD-10-CM | POA: Diagnosis not present

## 2016-08-14 ENCOUNTER — Encounter: Payer: Self-pay | Admitting: Family

## 2016-08-22 ENCOUNTER — Ambulatory Visit (INDEPENDENT_AMBULATORY_CARE_PROVIDER_SITE_OTHER): Payer: Medicare Other | Admitting: Family

## 2016-08-22 ENCOUNTER — Ambulatory Visit (HOSPITAL_COMMUNITY)
Admission: RE | Admit: 2016-08-22 | Discharge: 2016-08-22 | Disposition: A | Discharge status: Home / Self Care | Payer: Medicare Other | Source: Ambulatory Visit | Attending: Family | Admitting: Family

## 2016-08-22 ENCOUNTER — Encounter: Payer: Self-pay | Admitting: Family

## 2016-08-22 VITALS — BP 105/74 | HR 76 | Temp 97.0°F | Resp 16 | Ht 65.0 in | Wt 225.0 lb

## 2016-08-22 DIAGNOSIS — Z4889 Encounter for other specified surgical aftercare: Secondary | ICD-10-CM | POA: Insufficient documentation

## 2016-08-22 DIAGNOSIS — Z48812 Encounter for surgical aftercare following surgery on the circulatory system: Secondary | ICD-10-CM

## 2016-08-22 DIAGNOSIS — I6523 Occlusion and stenosis of bilateral carotid arteries: Secondary | ICD-10-CM

## 2016-08-22 DIAGNOSIS — Z9889 Other specified postprocedural states: Secondary | ICD-10-CM | POA: Diagnosis not present

## 2016-08-22 DIAGNOSIS — I714 Abdominal aortic aneurysm, without rupture, unspecified: Secondary | ICD-10-CM

## 2016-08-22 LAB — VAS US CAROTID
LEFT ECA DIAS: -24 cm/s
LEFT VERTEBRAL DIAS: 15 cm/s
LICAPDIAS: -40 cm/s
Left CCA dist dias: 25 cm/s
Left CCA dist sys: 58 cm/s
Left CCA prox dias: 26 cm/s
Left CCA prox sys: 67 cm/s
Left ICA dist dias: -20 cm/s
Left ICA dist sys: -52 cm/s
Left ICA prox sys: -127 cm/s
RCCAPDIAS: 12 cm/s
RCCAPSYS: 43 cm/s
RIGHT CCA MID DIAS: -17 cm/s
RIGHT ECA DIAS: -15 cm/s
RIGHT VERTEBRAL DIAS: -10 cm/s
Right cca dist sys: -52 cm/s

## 2016-08-22 NOTE — Progress Notes (Signed)
Chief Complaint: Follow up Extracranial Carotid Artery Stenosis   History of Present Illness  [name] is a 76 y.o. male patient of Dr. Kellie Simmering returns for continued follow-up regarding his mild carotid occlusive disease and previous abdominal aortic aneurysm resection. He denies any neurologic symptoms such as lateralizing weakness, aphasia, amaurosis fugax, diplopia, blurred vision, or syncope.  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 He is not on any anticoagulation medications other than aspirin daily   He is riding recumbent bike 5 days/week.  His low back and hips hurt with walking. He has BPH, had a TURP and is able to urinate better.  Pt Diabetic: yes, states his last A1C was 7.2? Pt smoker: former smoker, quit in 2014  Pt meds include: Statin : yes ASA: yes Other anticoagulants/antiplatelets: no   Past Medical History:  Diagnosis Date  . AAA (abdominal aortic aneurysm) ([place])    5/14 CT showed > 6 cm AAA, also right iliac aneurysm. Not stent graft candidate.   . Anemia of chronic disease   . Anxiety    pt. admits that he has anxiety at times   . Aortic stenosis    Moderate to severe by echo in 4/14. Bioprosthetic #21 [facility] Ease aortic valve replacement in 4/14.   . Arthritis   . CAD (coronary artery disease)    a. 4/14 CABG: LIMA-LAD, seq SVG-ramus & OM1, SVG-PDA  . Carotid arterial disease (HCC)    Carotid dopplers (1/28) with 78-67% LICA stenosis.   . CHF (congestive heart failure) ([place]) 07/2012; 10/17/2012  . Chronic kidney disease    increased creatinine recently - being followed, near kidney failure fr. contrast dye   . Chronic systolic heart failure (HCC)    a. 1/14 ECHO: sev dil LV, EF30-35%, diff HK, mild MR, AS severe, AV grad 35 b. 8/14 ECHO: EF 55-60%, mild biopros AV sten mn grad. 25, RV mild dil, RA mild dil  . Complication of anesthesia    during last surgery had to be given special medicine b/c "something  dropped" during surgery   . COPD (chronic obstructive pulmonary disease) ([place])    a.  History of heavy smoking. PFTs (4/14) with mixed obstructive (COPD) and restrictive (post-ARDS) picture.   . Diabetes mellitus without complication ([place])   . Esophageal reflux   . History of blood transfusion    "lots since January" (10/17/2012)  . Hyperlipidemia   . Hypertension   . Ischemic cardiomyopathy   . Ischemic cardiomyopathy    a. 4/14 LHC: pLAD 95, ost ramus 70-80, mLCx 90, EF 30%  . OSA (obstructive sleep apnea)    on cpap- every sleep time.   . Pneumonia 07/2012   PNA (H1N1 influenza + pneumococcus) in 6/72 complicated by respiratory failure/ARDS. Required tracheostomy, now weaned off. Had left empyema requiring chest tube.   . Prediabetes   . Prostate cancer [facility])     s/p radiation treatment. - (PT. DENIES)Has indwelling foley.   . Shortness of breath    still goes deer hunting by himself    Social History Social History  Substance Use Topics  . Smoking status: Former Smoker    Packs/day: 2.00    Years: 58.00    Types: Cigarettes    Quit date: 07/20/2012  . Smokeless tobacco: Never Used  . Alcohol use No     Comment: 10/17/2012 "years since I've had a drink; never had problem with it"    Family History Family History  Problem Relation Age of Onset  . Congestive Heart Failure    . Heart disease    . Cancer Mother   . Heart disease Father     Surgical History Past Surgical History:  Procedure Laterality Date  . ABDOMINAL AORTIC ANEURYSM REPAIR N/A 07/10/2013   Procedure: Resection and Graftiong of perirenal AAA; Insertion 14 x 8 Hemashield Graft Aorta to Left Common Iliac and to Right Common Femoral Artery With Ligastion of Right External and Interanl Iliac Artery;  Surgeon: Mal Misty, MD;  Location: [place];  Service: Vascular;  Laterality: N/A;  . ANAL FISSURE REPAIR  2008  . AORTIC VALVE REPLACEMENT N/A 10/25/2012   Procedure: AORTIC VALVE REPLACEMENT (AVR);  Surgeon:  Melrose Nakayama, MD;  Location: [place];  Service: Open Heart Surgery;  Laterality: N/A;  . CARDIAC VALVE REPLACEMENT    . CORONARY ARTERY BYPASS GRAFT N/A 10/25/2012   Procedure: CORONARY ARTERY BYPASS GRAFTING (CABG);  Surgeon: Melrose Nakayama, MD;  Location: [place];  Service: Open Heart Surgery;  Laterality: N/A;  times 4 using left internal mammary artery and endoscopically harvested bilateral saphenous vein   . EP IMPLANTABLE DEVICE N/A 09/22/2015   Procedure: BiV Upgrade;  Surgeon: Deboraha Sprang, MD;  Location: [place] CV LAB;  Service: Cardiovascular;  Laterality: N/A;  . INTRAOPERATIVE TRANSESOPHAGEAL ECHOCARDIOGRAM N/A 10/25/2012   Procedure: INTRAOPERATIVE TRANSESOPHAGEAL ECHOCARDIOGRAM;  Surgeon: Melrose Nakayama, MD;  Location: [place];  Service: Open Heart Surgery;  Laterality: N/A;  . LEFT AND RIGHT HEART CATHETERIZATION WITH CORONARY ANGIOGRAM N/A 10/22/2012   Procedure: LEFT AND RIGHT HEART CATHETERIZATION WITH CORONARY ANGIOGRAM;  Surgeon: Larey Dresser, MD;  Location: [facility] CATH LAB;  Service: Cardiovascular;  Laterality: N/A;  . NASAL FRACTURE SURGERY  1970's  . PACEMAKER REVISION  09/22/2015   pacemaker upgrade   . PERMANENT PACEMAKER INSERTION N/A 07/15/2014   Procedure: PERMANENT PACEMAKER INSERTION;  Surgeon: Deboraha Sprang, MD;  Location: [facility] CATH LAB;  Service: Cardiovascular;  Laterality: N/A;  . TRACHEOSTOMY  07/2012  . TRACHEOSTOMY CLOSURE  08/2012  . TRANSURETHRAL MICROWAVE THERAPY  10/15/2012    Allergies  Allergen Reactions  . Omnipaque [Iohexol] Other (See Comments)    Decreased kidney function  . Primaxin [Imipenem] Hives  . Cephalosporins Rash    Blisters  . Lisinopril     Drops Blood pressure  nausea   . Clindamycin/Lincomycin Swelling and Rash    Current Outpatient Prescriptions  Medication Sig Dispense Refill  . allopurinol (ZYLOPRIM) 100 MG tablet Take 100 mg by mouth at bedtime. Also take a 300 mg tablet during the day    . allopurinol  (ZYLOPRIM) 300 MG tablet Take 300 mg by mouth daily.    Marland Kitchen aspirin EC 81 MG tablet Take 81 mg by mouth daily.    Marland Kitchen atorvastatin (LIPITOR) 80 MG tablet TAKE 1 TABLET BY MOUTH EVERY DAY BEFORE BREAKFAST 30 tablet 3  . bisoprolol (ZEBETA) 5 MG tablet Take 0.5 tablets (2.5 mg total) by mouth at bedtime. 90 tablet 3  . calcitRIOL (ROCALTROL) 0.25 MCG capsule Take 0.25 mcg by mouth daily.     . fexofenadine (ALLEGRA) 180 MG tablet Take 90 mg by mouth daily.     Marland Kitchen JANUVIA 100 MG tablet Take 50 mg by mouth daily.   6  . NITROSTAT 0.4 MG SL tablet PLACE 1 TABLET UNDER TONGUE EVERY 5 MINUTES AS NEEDED FOR CHEST PAIN 25 tablet 0  . pantoprazole (PROTONIX) 40 MG tablet Take 40  mg by mouth at bedtime.     . potassium chloride (K-DUR,KLOR-CON) 10 MEQ tablet Take 2 tablets (20 mEq total) by mouth daily. 360 tablet 3  . spironolactone (ALDACTONE) 25 MG tablet TAKE 1/2 TABLET (12.5 MG TOTAL) BY MOUTH EVERY EVENING. 15 tablet 3  . torsemide (DEMADEX) 20 MG tablet Take 2 tablets (40 mg total) by mouth daily. 90 tablet 3  . umeclidinium-vilanterol (ANORO ELLIPTA) 62.5-25 MCG/INH AEPB Inhale 1 puff into the lungs daily. 60 each 3   No current facility-administered medications for this visit.     Review of Systems : See HPI for pertinent positives and negatives.  Physical Examination  Vitals:   08/22/16 1104 08/22/16 1106  BP: 112/73 105/74  Pulse: 76   Resp: 16   Temp: 97 F (36.1 C)   TempSrc: Oral   SpO2: 91%   Weight: 225 lb (102.1 kg)   Height: 5\' 5"  (1.651 m)    Body mass index is 37.44 kg/m.  General: WDWN obese male in NAD GAIT: normal Eyes: PERRLA Pulmonary:  Slightly labored at rest, few rales in left posterior fields, little air movement in all posterior fields, few wheezes in left posterior fields.  Cardiac: regular rhythm,  no detected murmur. Pacemaker palpated left upper chest.   VASCULAR EXAM Carotid Bruits Right Left   Negative Negative    Aorta is not palpable. Radial  pulses are 1+ palpable and equal.                                                                                                                                          LE Pulses Right Left       FEMORAL  faintly palpable  1+ palpable        POPLITEAL  not palpable   2+ palpable       POSTERIOR TIBIAL  2+ palpable   1+ palpable        DORSALIS PEDIS      ANTERIOR TIBIAL not palpable  not palpable     Gastrointestinal: soft, nontender, BS WNL, no r/g,  no palpable masses.  Musculoskeletal: no muscle atrophy/wasting. M/S 5/5 throughout, extremities without ischemic changes. He is wearing knee high compression hose. Ecchymosis and friable appearing skin at the dorsal aspects of both forearms.   Neurologic: A&O X 3; Appropriate Affect, Speech is normal CN 2-12 intact except is slightly hard of hearing, is wearing bilateral hearing aids, pain and light touch intact in extremities, Motor exam as listed above.      Assessment: [name] is a 76 y.o. male who has no history of stroke or TIA.  His atherosclerotic risk factors include almost in control DM, former smoker, CAD, stage 3 CKD, and obesity.   DATA Today's carotid duplex suggests <40% right ICA stenosis and 40-59%bleft ICA stenosis. Bilateral vertebral artery flow is antegrade.  Bilateral subclavian artery waveforms are  normal.  No change since exams of 08/11/2014 and 08-17-15.   ABI's in February 2016 were normal with triphasic waveforms.   Plan: Follow-up in 1 year with Carotid Duplex scan and ABI's.   I discussed in depth with the patient the nature of atherosclerosis, and emphasized the importance of maximal medical management including strict control of blood pressure, blood glucose, and lipid levels, obtaining regular exercise, and continued cessation of smoking.  The patient is aware that without maximal medical management the underlying atherosclerotic disease process will progress, limiting the  benefit of any interventions. The patient was given information about stroke prevention and what symptoms should prompt the patient to seek immediate medical care. Thank you for allowing [place] to participate in this patient's care.  Clemon Chambers, RN, MSN, FNP-C [facility] Office: [phone]  Clinic Physician: Early  08/22/16 11:11 AM

## 2016-08-22 NOTE — Patient Instructions (Signed)
Stroke Prevention Some medical conditions and behaviors are associated with an increased chance of having a stroke. You may prevent a stroke by making healthy choices and managing medical conditions. How can I reduce my risk of having a stroke?  Stay physically active. Get at least 30 minutes of activity on most or all days.  Do not smoke. It may also be helpful to avoid exposure to secondhand smoke.  Limit alcohol use. Moderate alcohol use is considered to be:  No more than 2 drinks per day for men.  No more than 1 drink per day for nonpregnant women.  Eat healthy foods. This involves:  Eating 5 or more servings of fruits and vegetables a day.  Making dietary changes that address high blood pressure (hypertension), high cholesterol, diabetes, or obesity.  Manage your cholesterol levels.  Making food choices that are high in fiber and low in saturated fat, trans fat, and cholesterol may control cholesterol levels.  Take any prescribed medicines to control cholesterol as directed by your health care provider.  Manage your diabetes.  Controlling your carbohydrate and sugar intake is recommended to manage diabetes.  Take any prescribed medicines to control diabetes as directed by your health care provider.  Control your hypertension.  Making food choices that are low in salt (sodium), saturated fat, trans fat, and cholesterol is recommended to manage hypertension.  Ask your health care provider if you need treatment to lower your blood pressure. Take any prescribed medicines to control hypertension as directed by your health care provider.  If you are 18-39 years of age, have your blood pressure checked every 3-5 years. If you are 40 years of age or older, have your blood pressure checked every year.  Maintain a healthy weight.  Reducing calorie intake and making food choices that are low in sodium, saturated fat, trans fat, and cholesterol are recommended to manage  weight.  Stop drug abuse.  Avoid taking birth control pills.  Talk to your health care provider about the risks of taking birth control pills if you are over 35 years old, smoke, get migraines, or have ever had a blood clot.  Get evaluated for sleep disorders (sleep apnea).  Talk to your health care provider about getting a sleep evaluation if you snore a lot or have excessive sleepiness.  Take medicines only as directed by your health care provider.  For some people, aspirin or blood thinners (anticoagulants) are helpful in reducing the risk of forming abnormal blood clots that can lead to stroke. If you have the irregular heart rhythm of atrial fibrillation, you should be on a blood thinner unless there is a good reason you cannot take them.  Understand all your medicine instructions.  Make sure that other conditions (such as anemia or atherosclerosis) are addressed. Get help right away if:  You have sudden weakness or numbness of the face, arm, or leg, especially on one side of the body.  Your face or eyelid droops to one side.  You have sudden confusion.  You have trouble speaking (aphasia) or understanding.  You have sudden trouble seeing in one or both eyes.  You have sudden trouble walking.  You have dizziness.  You have a loss of balance or coordination.  You have a sudden, severe headache with no known cause.  You have new chest pain or an irregular heartbeat. Any of these symptoms may represent a serious problem that is an emergency. Do not wait to see if the symptoms will go away.   Get medical help at once. Call your local emergency services (911 in [place] Do not drive yourself to the hospital. This information is not intended to replace advice given to you by your health care provider. Make sure you discuss any questions you have with your health care provider. Document Released: 07/27/2004 Document Revised: 11/25/2015 Document Reviewed: 12/20/2012 Elsevier  Interactive Patient Education  2017 Elsevier Inc.  

## 2016-08-25 NOTE — Addendum Note (Signed)
Addended by: Lianne Cure A on: 08/25/2016 02:58 PM   Modules accepted: Orders

## 2016-08-27 ENCOUNTER — Other Ambulatory Visit (HOSPITAL_COMMUNITY): Payer: Self-pay | Admitting: Cardiology

## 2016-09-04 DIAGNOSIS — N183 Chronic kidney disease, stage 3 (moderate): Secondary | ICD-10-CM | POA: Diagnosis not present

## 2016-09-04 DIAGNOSIS — E1129 Type 2 diabetes mellitus with other diabetic kidney complication: Secondary | ICD-10-CM | POA: Diagnosis not present

## 2016-09-04 DIAGNOSIS — D631 Anemia in chronic kidney disease: Secondary | ICD-10-CM | POA: Diagnosis not present

## 2016-09-04 DIAGNOSIS — I129 Hypertensive chronic kidney disease with stage 1 through stage 4 chronic kidney disease, or unspecified chronic kidney disease: Secondary | ICD-10-CM | POA: Diagnosis not present

## 2016-09-04 DIAGNOSIS — N2581 Secondary hyperparathyroidism of renal origin: Secondary | ICD-10-CM | POA: Diagnosis not present

## 2016-09-04 DIAGNOSIS — E782 Mixed hyperlipidemia: Secondary | ICD-10-CM | POA: Diagnosis not present

## 2016-09-04 DIAGNOSIS — I350 Nonrheumatic aortic (valve) stenosis: Secondary | ICD-10-CM | POA: Diagnosis not present

## 2016-09-04 DIAGNOSIS — I2510 Atherosclerotic heart disease of native coronary artery without angina pectoris: Secondary | ICD-10-CM | POA: Diagnosis not present

## 2016-09-04 DIAGNOSIS — Z950 Presence of cardiac pacemaker: Secondary | ICD-10-CM | POA: Diagnosis not present

## 2016-09-04 DIAGNOSIS — M109 Gout, unspecified: Secondary | ICD-10-CM | POA: Diagnosis not present

## 2016-09-04 DIAGNOSIS — I255 Ischemic cardiomyopathy: Secondary | ICD-10-CM | POA: Diagnosis not present

## 2016-09-04 DIAGNOSIS — N390 Urinary tract infection, site not specified: Secondary | ICD-10-CM | POA: Diagnosis not present

## 2016-09-06 ENCOUNTER — Other Ambulatory Visit (HOSPITAL_COMMUNITY): Payer: Self-pay | Admitting: Cardiology

## 2016-09-08 ENCOUNTER — Encounter: Payer: Self-pay | Admitting: Internal Medicine

## 2016-09-22 ENCOUNTER — Telehealth: Payer: Self-pay | Admitting: Pulmonary Disease

## 2016-09-22 NOTE — Telephone Encounter (Signed)
Called [employer] and spoke with Melissa and she will let [place] know that I have faxed over the OV notes that she needed.

## 2016-09-27 ENCOUNTER — Encounter: Payer: Self-pay | Admitting: Pulmonary Disease

## 2016-09-27 ENCOUNTER — Encounter: Payer: Self-pay | Admitting: Internal Medicine

## 2016-10-09 ENCOUNTER — Ambulatory Visit (INDEPENDENT_AMBULATORY_CARE_PROVIDER_SITE_OTHER): Payer: Medicare Other | Admitting: Internal Medicine

## 2016-10-09 ENCOUNTER — Encounter: Payer: Self-pay | Admitting: Internal Medicine

## 2016-10-09 VITALS — BP 111/73 | HR 80 | Ht 65.0 in | Wt 232.0 lb

## 2016-10-09 DIAGNOSIS — I442 Atrioventricular block, complete: Secondary | ICD-10-CM | POA: Diagnosis not present

## 2016-10-09 DIAGNOSIS — Z950 Presence of cardiac pacemaker: Secondary | ICD-10-CM

## 2016-10-09 DIAGNOSIS — I6523 Occlusion and stenosis of bilateral carotid arteries: Secondary | ICD-10-CM

## 2016-10-09 DIAGNOSIS — I429 Cardiomyopathy, unspecified: Secondary | ICD-10-CM

## 2016-10-09 DIAGNOSIS — I5042 Chronic combined systolic (congestive) and diastolic (congestive) heart failure: Secondary | ICD-10-CM | POA: Diagnosis not present

## 2016-10-09 DIAGNOSIS — R000 Tachycardia, unspecified: Secondary | ICD-10-CM | POA: Diagnosis not present

## 2016-10-09 NOTE — Progress Notes (Signed)
Patient Care Team: [name], MD as PCP - General (Internal Medicine) Larey Dresser, MD as Attending Physician (Cardiology)   HPI  [name] is a 76 y.o. 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 He underwent device implantation January 2016.  Cardiac evaluation has included an echocardiogram 1/15 demonstrated near-normal LV function.  Interval echocardiogram 10/26 demonstrated LV dysfunction with an EF of 30-35%. Myoview confirmed this.  Repeat Echo unfortunately showed further interval deterioration 20-25%.  DATE TEST    2/17    echo   EF 20-25 %   8/17    echo   EF 50 %           Because of this he underwent CRT upgrade. He is noted much less shortness of breath. There is no chest pain. He's had no syncope. He continues to struggle with f,luid especially he was given a new withi diabetic medication   .    Past Medical History:  Diagnosis Date  . AAA (abdominal aortic aneurysm) ([place])    5/14 CT showed > 6 cm AAA, also right iliac aneurysm. Not stent graft candidate.   . Anemia of chronic disease   . Anxiety    pt. admits that he has anxiety at times   . Aortic stenosis    Moderate to severe by echo in 4/14. Bioprosthetic #21 [facility] Ease aortic valve replacement in 4/14.   . Arthritis   . CAD (coronary artery disease)    a. 4/14 CABG: LIMA-LAD, seq SVG-ramus & OM1, SVG-PDA  . Carotid arterial disease (HCC)    Carotid dopplers (1/60) with 73-71% LICA stenosis.   . CHF (congestive heart failure) ([place]) 07/2012; 10/17/2012  . Chronic kidney disease    increased creatinine recently - being followed, near kidney failure fr. contrast dye   . Chronic systolic heart failure (HCC)    a. 1/14 ECHO: sev dil LV, EF30-35%, diff HK, mild MR, AS severe, AV grad 35 b. 8/14 ECHO: EF 55-60%, mild biopros AV sten mn grad. 25, RV mild dil, RA mild dil  . Complication of anesthesia    during last surgery had to be given special medicine b/c "something dropped" during surgery   . COPD (chronic obstructive pulmonary disease) ([place])    a.  History of heavy smoking. PFTs (4/14) with mixed obstructive (COPD) and restrictive (post-ARDS) picture.   . Diabetes mellitus without complication ([place])   . Esophageal reflux   . History of blood transfusion    "lots since January" (10/17/2012)  . Hyperlipidemia   . Hypertension   . Ischemic cardiomyopathy   . Ischemic cardiomyopathy    a. 4/14 LHC: pLAD 95, ost ramus 70-80, mLCx 90, EF 30%  . OSA (obstructive sleep apnea)    on cpap- every sleep time.   . Pneumonia 07/2012   PNA (H1N1 influenza + pneumococcus) in 0/62 complicated by respiratory failure/ARDS. Required tracheostomy, now weaned off. Had left empyema requiring chest tube.   . Prediabetes   . Prostate cancer [facility])     s/p radiation treatment. - (PT. DENIES)Has indwelling foley.   . Shortness of breath    still goes deer hunting by himself    Past Surgical History:  Procedure Laterality Date  . ABDOMINAL AORTIC ANEURYSM REPAIR N/A 07/10/2013   Procedure: Resection and Graftiong of perirenal AAA; Insertion 14 x 8 Hemashield Graft Aorta to Left Common Iliac and to Right Common  Femoral Artery With Ligastion of Right External and Interanl Iliac Artery;  Surgeon: Mal Misty, MD;  Location: [place];  Service: Vascular;  Laterality: N/A;  . ANAL FISSURE REPAIR  2008  . AORTIC VALVE REPLACEMENT N/A 10/25/2012   Procedure: AORTIC VALVE REPLACEMENT (AVR);  Surgeon: Melrose Nakayama, MD;  Location: [place];  Service: Open Heart Surgery;  Laterality: N/A;  . CARDIAC VALVE REPLACEMENT    . CORONARY ARTERY BYPASS GRAFT N/A 10/25/2012   Procedure: CORONARY ARTERY BYPASS GRAFTING (CABG);  Surgeon: Melrose Nakayama, MD;  Location: [place];  Service: Open Heart Surgery;  Laterality: N/A;  times 4 using left internal mammary artery and endoscopically harvested bilateral saphenous vein   .  EP IMPLANTABLE DEVICE N/A 09/22/2015   Procedure: BiV Upgrade;  Surgeon: Deboraha Sprang, MD;  Location: [place] CV LAB;  Service: Cardiovascular;  Laterality: N/A;  . INTRAOPERATIVE TRANSESOPHAGEAL ECHOCARDIOGRAM N/A 10/25/2012   Procedure: INTRAOPERATIVE TRANSESOPHAGEAL ECHOCARDIOGRAM;  Surgeon: Melrose Nakayama, MD;  Location: [place];  Service: Open Heart Surgery;  Laterality: N/A;  . LEFT AND RIGHT HEART CATHETERIZATION WITH CORONARY ANGIOGRAM N/A 10/22/2012   Procedure: LEFT AND RIGHT HEART CATHETERIZATION WITH CORONARY ANGIOGRAM;  Surgeon: Larey Dresser, MD;  Location: [facility] CATH LAB;  Service: Cardiovascular;  Laterality: N/A;  . NASAL FRACTURE SURGERY  1970's  . PACEMAKER REVISION  09/22/2015   pacemaker upgrade   . PERMANENT PACEMAKER INSERTION N/A 07/15/2014   Procedure: PERMANENT PACEMAKER INSERTION;  Surgeon: Deboraha Sprang, MD;  Location: [facility] CATH LAB;  Service: Cardiovascular;  Laterality: N/A;  . TRACHEOSTOMY  07/2012  . TRACHEOSTOMY CLOSURE  08/2012  . TRANSURETHRAL MICROWAVE THERAPY  10/15/2012    Current Outpatient Prescriptions  Medication Sig Dispense Refill  . allopurinol (ZYLOPRIM) 100 MG tablet Take 100 mg by mouth at bedtime. Also take a 300 mg tablet during the day    . allopurinol (ZYLOPRIM) 300 MG tablet Take 300 mg by mouth daily.    Marland Kitchen aspirin EC 81 MG tablet Take 81 mg by mouth daily.    Marland Kitchen atorvastatin (LIPITOR) 80 MG tablet TAKE 1 TABLET BY MOUTH EVERY DAY BEFORE BREAKFAST 30 tablet 3  . bisoprolol (ZEBETA) 5 MG tablet Take 0.5 tablets (2.5 mg total) by mouth at bedtime. 90 tablet 3  . calcitRIOL (ROCALTROL) 0.25 MCG capsule Take 0.25 mcg by mouth daily.     . fexofenadine (ALLEGRA) 180 MG tablet Take 90 mg by mouth daily.     Marland Kitchen JANUVIA 100 MG tablet Take 50 mg by mouth daily.   6  . NITROSTAT 0.4 MG SL tablet PLACE 1 TABLET UNDER TONGUE EVERY 5 MINUTES AS NEEDED FOR CHEST PAIN 25 tablet 0  . pantoprazole (PROTONIX) 40 MG tablet Take 40 mg by mouth at bedtime.      . potassium chloride (K-DUR,KLOR-CON) 10 MEQ tablet Take 2 tablets (20 mEq total) by mouth daily. 360 tablet 3  . potassium chloride (KLOR-CON M10) 10 MEQ tablet Take 2 tablets (20 mEq total) by mouth 2 (two) times daily. 360 tablet 1  . torsemide (DEMADEX) 20 MG tablet Take 2 tablets (40 mg total) by mouth daily. 90 tablet 3  . umeclidinium-vilanterol (ANORO ELLIPTA) 62.5-25 MCG/INH AEPB Inhale 1 puff into the lungs daily. 60 each 3   No current facility-administered medications for this visit.     Allergies  Allergen Reactions  . Omnipaque [Iohexol] Other (See Comments)    Decreased kidney function  . Primaxin [Imipenem] Hives  .  Cephalosporins Rash    Blisters  . Lisinopril     Drops Blood pressure  nausea   . Clindamycin/Lincomycin Swelling and Rash    Review of Systems negative except from HPI and PMH  Physical Exam BP 111/73   Pulse 80   Ht 5\' 5"  (1.651 m)   Wt 232 lb (105.2 kg)   SpO2 90%   BMI 38.61 kg/m  Well developed and well nourished in no acute distress HENT normal E scleral and icterus clear Neck Supple JVP9; carotids brisk and full Clear to ausculation Device pocket well healed; without hematoma or erythema.  There is no tethering Regular rate and rhythm, no murmurs gallops or rub Soft with active bowel sounds No clubbing cyanosis tr Edema Alert and oriented, grossly normal motor and sensory function Skin Warm and Dry  ECG demonstrates P synchronous pacing At 82 intervals 21/14/46 RS V1 qR lead 1    Assessment and  Plan  High-grade heart block now device dependent  Obesity  Obstructive sleep apnea  Cardiomyopathy    Congestive heart failure-chronic-mixed  Sinus tachycardia  CRT Pacemaker-Medtronic The patient's device was interrogated.  The information was reviewed.   Ischemic heart disease with prior bypass surgery   Without symptoms of ischemia  Ejection fraction is much better. I've asked him to call Dr. Jimmy Footman and his  primary care physician to see if they get on a different diabetic regime that will not be associated with volume retention.  Device reprogrammed to augment resynchronization   Encouraged weight loss given obesity

## 2016-10-09 NOTE — Patient Instructions (Signed)
Medication Instructions: - Your physician recommends that you continue on your current medications as directed. Please refer to the Current Medication list given to you today.  Labwork: - none ordered  Procedures/Testing: - none ordered  Follow-Up: - Remote monitoring is used to monitor your Pacemaker of ICD from home. This monitoring reduces the number of office visits required to check your device to one time per year. It allows [place] to keep an eye on the functioning of your device to ensure it is working properly. You are scheduled for a device check from home on 01/08/17. You may send your transmission at any time that day. If you have a wireless device, the transmission will be sent automatically. After your physician reviews your transmission, you will receive a postcard with your next transmission date.  - Your physician wants you to follow-up in: 1 year with Dr. Caryl Comes. You will receive a reminder letter in the mail two months in advance. If you don't receive a letter, please call our office to schedule the follow-up appointment.   Any Additional Special Instructions Will Be Listed Below (If Applicable).     If you need a refill on your cardiac medications before your next appointment, please call your pharmacy.

## 2016-10-10 LAB — CUP PACEART INCLINIC DEVICE CHECK
Implantable Lead Implant Date: 20160113
Implantable Lead Implant Date: 20170322
Implantable Lead Location: 753858
Implantable Lead Location: 753860
Implantable Lead Model: 5076
Implantable Lead Model: 5076
MDC IDC LEAD IMPLANT DT: 20160113
MDC IDC LEAD LOCATION: 753859
MDC IDC PG IMPLANT DT: 20170322
MDC IDC SESS DTM: 20180410194250
Pulse Gen Serial Number: 7838612

## 2016-10-18 DIAGNOSIS — H02103 Unspecified ectropion of right eye, unspecified eyelid: Secondary | ICD-10-CM | POA: Diagnosis not present

## 2016-10-31 DIAGNOSIS — H02103 Unspecified ectropion of right eye, unspecified eyelid: Secondary | ICD-10-CM | POA: Diagnosis not present

## 2016-11-08 DIAGNOSIS — E119 Type 2 diabetes mellitus without complications: Secondary | ICD-10-CM | POA: Diagnosis not present

## 2016-11-08 DIAGNOSIS — N183 Chronic kidney disease, stage 3 (moderate): Secondary | ICD-10-CM | POA: Diagnosis not present

## 2016-11-08 DIAGNOSIS — M109 Gout, unspecified: Secondary | ICD-10-CM | POA: Diagnosis not present

## 2016-11-08 DIAGNOSIS — G4733 Obstructive sleep apnea (adult) (pediatric): Secondary | ICD-10-CM | POA: Diagnosis not present

## 2016-11-08 DIAGNOSIS — I119 Hypertensive heart disease without heart failure: Secondary | ICD-10-CM | POA: Diagnosis not present

## 2016-11-08 DIAGNOSIS — D539 Nutritional anemia, unspecified: Secondary | ICD-10-CM | POA: Diagnosis not present

## 2016-11-13 DIAGNOSIS — E119 Type 2 diabetes mellitus without complications: Secondary | ICD-10-CM | POA: Diagnosis not present

## 2016-11-13 DIAGNOSIS — H25813 Combined forms of age-related cataract, bilateral: Secondary | ICD-10-CM | POA: Diagnosis not present

## 2016-12-07 ENCOUNTER — Other Ambulatory Visit: Payer: Self-pay | Admitting: Pulmonary Disease

## 2016-12-11 ENCOUNTER — Encounter: Payer: Self-pay | Admitting: Pulmonary Disease

## 2016-12-11 DIAGNOSIS — H02103 Unspecified ectropion of right eye, unspecified eyelid: Secondary | ICD-10-CM | POA: Diagnosis not present

## 2016-12-20 ENCOUNTER — Ambulatory Visit (HOSPITAL_COMMUNITY)
Admission: RE | Admit: 2016-12-20 | Discharge: 2016-12-20 | Disposition: A | Discharge status: Home / Self Care | Payer: Medicare Other | Source: Ambulatory Visit | Attending: Cardiology | Admitting: Cardiology

## 2016-12-20 VITALS — BP 112/70 | HR 101 | Wt 236.6 lb

## 2016-12-20 DIAGNOSIS — J449 Chronic obstructive pulmonary disease, unspecified: Secondary | ICD-10-CM | POA: Insufficient documentation

## 2016-12-20 DIAGNOSIS — I509 Heart failure, unspecified: Secondary | ICD-10-CM | POA: Diagnosis not present

## 2016-12-20 DIAGNOSIS — N183 Chronic kidney disease, stage 3 unspecified: Secondary | ICD-10-CM

## 2016-12-20 DIAGNOSIS — I5042 Chronic combined systolic (congestive) and diastolic (congestive) heart failure: Secondary | ICD-10-CM

## 2016-12-20 DIAGNOSIS — Z923 Personal history of irradiation: Secondary | ICD-10-CM | POA: Insufficient documentation

## 2016-12-20 DIAGNOSIS — I442 Atrioventricular block, complete: Secondary | ICD-10-CM | POA: Diagnosis not present

## 2016-12-20 DIAGNOSIS — M109 Gout, unspecified: Secondary | ICD-10-CM | POA: Diagnosis not present

## 2016-12-20 DIAGNOSIS — Z8546 Personal history of malignant neoplasm of prostate: Secondary | ICD-10-CM | POA: Insufficient documentation

## 2016-12-20 DIAGNOSIS — Z7982 Long term (current) use of aspirin: Secondary | ICD-10-CM | POA: Diagnosis not present

## 2016-12-20 DIAGNOSIS — I5022 Chronic systolic (congestive) heart failure: Secondary | ICD-10-CM | POA: Insufficient documentation

## 2016-12-20 DIAGNOSIS — Z930 Tracheostomy status: Secondary | ICD-10-CM | POA: Insufficient documentation

## 2016-12-20 DIAGNOSIS — I2510 Atherosclerotic heart disease of native coronary artery without angina pectoris: Secondary | ICD-10-CM | POA: Diagnosis not present

## 2016-12-20 DIAGNOSIS — Z953 Presence of xenogenic heart valve: Secondary | ICD-10-CM | POA: Insufficient documentation

## 2016-12-20 DIAGNOSIS — J80 Acute respiratory distress syndrome: Secondary | ICD-10-CM | POA: Insufficient documentation

## 2016-12-20 DIAGNOSIS — Z87891 Personal history of nicotine dependence: Secondary | ICD-10-CM | POA: Insufficient documentation

## 2016-12-20 DIAGNOSIS — Z951 Presence of aortocoronary bypass graft: Secondary | ICD-10-CM | POA: Diagnosis not present

## 2016-12-20 DIAGNOSIS — Z950 Presence of cardiac pacemaker: Secondary | ICD-10-CM | POA: Diagnosis not present

## 2016-12-20 DIAGNOSIS — I6529 Occlusion and stenosis of unspecified carotid artery: Secondary | ICD-10-CM | POA: Diagnosis not present

## 2016-12-20 DIAGNOSIS — E785 Hyperlipidemia, unspecified: Secondary | ICD-10-CM | POA: Insufficient documentation

## 2016-12-20 DIAGNOSIS — Z9989 Dependence on other enabling machines and devices: Secondary | ICD-10-CM | POA: Diagnosis not present

## 2016-12-20 DIAGNOSIS — G4733 Obstructive sleep apnea (adult) (pediatric): Secondary | ICD-10-CM | POA: Diagnosis not present

## 2016-12-20 LAB — BASIC METABOLIC PANEL
Anion gap: 6 (ref 5–15)
BUN: 26 mg/dL — AB (ref 6–20)
CHLORIDE: 103 mmol/L (ref 101–111)
CO2: 31 mmol/L (ref 22–32)
CREATININE: 1.57 mg/dL — AB (ref 0.61–1.24)
Calcium: 9.4 mg/dL (ref 8.9–10.3)
GFR calc Af Amer: 48 mL/min — ABNORMAL LOW (ref >60)
GFR calc non Af Amer: 42 mL/min — ABNORMAL LOW (ref >60)
Glucose, Bld: 109 mg/dL — ABNORMAL HIGH (ref 65–99)
Potassium: 4.4 mmol/L (ref 3.5–5.1)
SODIUM: 140 mmol/L (ref 135–145)

## 2016-12-20 LAB — BRAIN NATRIURETIC PEPTIDE: B Natriuretic Peptide: 55.3 pg/mL (ref 0.0–100.0)

## 2016-12-20 MED ORDER — METOLAZONE 2.5 MG PO TABS: 2.5000 mg | ORAL_TABLET | Freq: Every day | ORAL | 0 refills | Status: DC

## 2016-12-20 NOTE — Patient Instructions (Addendum)
Routine lab work today. Will notify you of abnormal results, otherwise no news is good news!  Take EXTRA 40 mg (2 tabs) Torsemide today and tomorrow in the afternoon, then resume normal daily dose of 40 mg (2 tabs) once daily.  Take metolazone 2.5 mg tablet once today only.  Follow up 4-6 weeks with Dr. [name]. Take all medication as prescribed the day of your appointment. Bring all medications with you to your appointment.  Do the following things EVERYDAY: 1) Weigh yourself in the morning before breakfast. Write it down and keep it in a log. 2) Take your medicines as prescribed 3) Eat low salt foods-Limit salt (sodium) to 2000 mg per day.  4) Stay as active as you can everyday 5) Limit all fluids for the day to less than 2 liters

## 2016-12-20 NOTE — Progress Notes (Signed)
Advanced Heart Failure Note   Patient ID: [name], male   DOB: 1941/06/07, 76 y.o.   MRN: [id number] PCP: Dr. [name] Nephrologist: Dr Deterding Vascular: Dr Kellie Simmering HF: [place] Pulmonary: Dr Elsworth Soho  76 yo with history of ARDS/respiratory failure and tracheostomy in 07/2012 as well as DM, HTN, and COPD presented to the ER at [facility] with CHF in 10/2012. Patient had a prolonged hospitalization in 07/2012 with H1N1 influenza and pneumococcal PNA. This progressed to ARDS. He was intubated and ended up with tracheostomy. He had the trach removed. He also had a left empyema requiring chest tube, septic shock with elevated troponin, and AKI which resolved.  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   He was admitted again in 10/2012 from his nursing home with exertional dyspnea and orthopnea. He had gained 21 lbs. Echo at admission showed EF 35% with global hypokinesis that looked worse in the anteroseptal wall. He also was noted to have aortic stenosis rated as moderate to severe. He was diuresed and cathed, showing severe 3 vessel disease. He then had CABG-AVR (bioprosthetic valve).   He had a large AAA and underwent surgical repair in 07/2013.  He had AKI and Pseudomonas PNA post-operatively.  He was discharged to a nursing home. Echo in 1/15 showed EF 55-60% with mildly dilated and dysfunctional RV and a bioprosthetic aortic valve with mean gradient 32 mmHg (higher than expected).   Repeat limited echo in 08/2013 showed shower aortic valve mean gradient of 21 mmHg.   In 11/15, patient had a holter monitor placed for bradycardia.  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  Baseline bifascicular block.  His HR also was noted to drop into the 40s with exercise, suggesting exercise-associated heart block.  He underwent Medtronic-PCM placement in 1/16 by Dr Caryl Comes.   Echo in 10/16 showed fall in EF to 30% with mean aortic valve gradient 21 mmHg.  Repeat echo in 2/17 showed EF  20-25%, mean aortic valve gradient 24 mmHg with moderately decreased RV systolic function.  Cardiolite in 10/16 showed inferior scar with no ischemia.  He is RV pacing almost all the time.  Low dose DSE was done in 2/17 to assess for significant bioprosthetic valve stenosis => mean gradient with dobutamine was only 31 mmHg, so suspect no more than moderate stenosis.  He had St Jude CRT upgrade in 3/17.  He decided against ICD.  Repeat echo post-CRT in 8/17 showed EF 50%, diffuse hypokinesis, bioprosthetic aortic valve with mean gradient 27 mmHg.    Today he returns for HF follow up. Overall feeling just ok.  Weight has been trending up. A month ago he doubled torsemide but says he had no improvement. Weight at home trending up 223- 235 pounds. SOB steps. Denies PND/Orthopnea.  Exercising 5 days week 45 minutes at a time. Continue CPAP at night. Eating high salt foods such as pretzel and chips. Drinking > 2 liters of fluid. Taking all medications.   Labs (2/15): K 4, creatinine 1.6, BNP 166 Labs (3/15): Hgb 11.6, pro-BNP 1037, creatinine 1.76, BUN 30, LDL 75, HDL 26 Labs (8/15): K 3.9, creatinine 1.55, HCT 40.6, LDL 71, HDL 27 Labs (1/16): K 4.3, Creatinine 1.6, HCT 42.9 Labs (2/17): K 4.0 Creatinine 1.64, TSH normal.  Labs (3/17): K 4.5, creatinine 1.49, HCT 42.9 Labs (4/17): K 4, creatinine 1.48, BNP 125 Labs (6/17): LDL 27, HDL 24, TGs 245 Labs (7/17): K 4.9, creatinine 1.71 Labs (8/17): K 4.3, creatinine 1.7, LDL 64,  HDl 25, TGs 198 Lab (06/16/2016): K 4.9 Creatinine 1.84  Labs (10/2016): Creatinine 1.5 K 3.9   1. PNA (H1N1 influenza + pneumococcus) in 0/35 complicated by respiratory failure/ARDS. Required tracheostomy, now weaned off. Had left empyema requiring chest tube.  Has developed post-infectious pulmonary fibrosis.  2. Prostate CA s/p radiation treatment. Has indwelling foley.  3. OSA: using CPAP 4. HTN  5. Hyperlipidemia  6. COPD: History of heavy smoking. PFTs (4/14) with mixed  obstructive (COPD) and restrictive (post-ARDS) picture.  7. Type II diabetes  8. Anemia of chronic disease.  9. Ischemic cardiomyopathy:  - Echo (1/14) with severely dilated LV, EF 30-35%, diffuse hypokinesis worse in the anteroseptal wall, grade II diastolic dysfunction, mild MR, AS interpreted as "moderate to severe" with aortic valve mean gradient 23 mmHg.  - Echo (4/14) witih EF 35-40%, mid to apical anteroseptal akinesis, moderate to severe AS with mean gradient 33 mmHg, AVA 0.91 cm^2.  -Echo (5/14) post CABG showed EF 50%, anteroseptal hypokinesis, bioprosthetic aortic valve well-seated. - Echo (1/15) with EF 55-60%, mild LVH, mildly dilated RV with mildly decreased systolic function, D-shaped interventricular septum, bioprosthetic aortic valve with mean gardient 32 mmHg.   - Echo (3/15) with EF 55-60%, mild LVH, bioprosthetic aortic valve with mean gradient 21 mmHg (lower), no AI, RV moderately dilated with mild to moderately decreased systolic function.   - Echo (10/16) with EF 30%, bioprosthetic aortic valve with mean gradient 21 mmHg.   - Echo (2/17) with EF 20-25%, mild LVH, aortic valve mean gradient 24 mmHg/peak 39 mmHg, RV moderately dilated with moderately decreased systolic function.  - St Jude CRT upgrade in 3/17 (did not want ICD).  - Echo (8/17) with EF 50%, diffuse hypokinesis, bioprosthetic aortic valve with mean gradient 27 mmHg, normal RV size with mildly decreased systolic function.   - AKI with lisinopril.  10. Aortic stenosis: Moderate to severe by echo in 4/14. Bioprosthetic #21 [facility] Ease aortic valve replacement in 4/14.  Low dose DSE (2/17) showed that Mr Strohmeier likely has no more than moderate bioprosthetic valve stenosis with mean gradient up to 31 mmHg with dobutamine.  11. CAD: LHC (4/14) with 95% pLAD, 70-80% ostial ramus, 70% mLCx, 90% pRCA, EF 35%.  - CABG 4/14 with LIMA-LAD, seq SVG-ramus and OM1, SVG-PDA.  - Cardiolite (10/16) with EF 42%, fixed inferior  defect, no ischemia.   12. Carotid stenosis: Carotid dopplers (4/65) with 68-12% LICA stenosis.  Carotids (7/51) with 70-01% LICA stenosis. Carotids (7/49) with 44-96% LICA stenosis.  13. AAA: 5/14 CT showed > 6 cm AAA, also right iliac aneurysm. Not stent graft candidate.  7/14 CT showed 7 cm AAA.  Surgical repair 1/15.  14. CKD: AKI in 7/14 from contrast nephropathy.  AKI in 1/15 after AAA repair.  15. Contrast allergy.  16. ABIs 9/14 were normal.  17. Gout 18. 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  HR drops with exercise (likely exercise-induced heart block).  Medtronic dual chamber PPM placed in 2016.  Recent pacemaker interrogation showed complete heart block.  He is pacemaker-dependent, upgraded to CRT in 3/17.   SH: Quit smoking in 1/14, married, no ETOH, lives in [place].   FH: No premature CAD  ROS: All systems reviewed and negative except as per HPI.    Current Outpatient Prescriptions  Medication Sig Dispense Refill  . allopurinol (ZYLOPRIM) 100 MG tablet Take 100 mg by mouth at bedtime. Also take a 300 mg tablet during the  day    . allopurinol (ZYLOPRIM) 300 MG tablet Take 300 mg by mouth daily.    Jearl Klinefelter ELLIPTA 62.5-25 MCG/INH AEPB INHALE 1 PUFF INTO THE LUNGS DAILY. 60 each 3  . aspirin EC 81 MG tablet Take 81 mg by mouth daily.    Marland Kitchen atorvastatin (LIPITOR) 80 MG tablet TAKE 1 TABLET BY MOUTH EVERY DAY BEFORE BREAKFAST 30 tablet 3  . calcitRIOL (ROCALTROL) 0.25 MCG capsule Take 0.25 mcg by mouth daily.     . fexofenadine (ALLEGRA) 180 MG tablet Take 90 mg by mouth daily.     Marland Kitchen JANUVIA 100 MG tablet Take 50 mg by mouth daily.   6  . pantoprazole (PROTONIX) 40 MG tablet Take 40 mg by mouth at bedtime.     . potassium chloride (KLOR-CON M10) 10 MEQ tablet Take 2 tablets (20 mEq total) by mouth 2 (two) times daily. 360 tablet 1  . torsemide (DEMADEX) 20 MG tablet Take 2 tablets (40 mg total) by mouth daily. 90 tablet 3  . NITROSTAT 0.4 MG SL  tablet PLACE 1 TABLET UNDER TONGUE EVERY 5 MINUTES AS NEEDED FOR CHEST PAIN (Patient not taking: Reported on 12/20/2016) 25 tablet 0   No current facility-administered medications for this encounter.     Vitals:   12/20/16 1029  BP: 112/70  Pulse: (!) 101  SpO2: 90%  Weight: 236 lb 9.6 oz (107.3 kg)    Wt Readings from Last 3 Encounters:  12/20/16 236 lb 9.6 oz (107.3 kg)  10/09/16 232 lb (105.2 kg)  08/22/16 225 lb (102.1 kg)    PHYSICAL EXAM General:  Appears fatigued. No resp difficulty. Wife present.  HEENT: normal Neck: supple. JVP 9-10. Carotids 2+ bilat; no bruits. No lymphadenopathy or thryomegaly appreciated. Cor: PMI nondisplaced. Regular rate & rhythm. No rubs, gallops or murmurs. Lungs: clear Abdomen: soft, nontender, ++ ndistended. No hepatosplenomegaly. No bruits or masses. Good bowel sounds. Extremities: no cyanosis, clubbing, rash, R and LLE 1+ edema Neuro: alert & orientedx3, cranial nerves grossly intact. moves all 4 extremities w/o difficulty. Affect pleasant  EKG- A sensed V paced 92 bpm  ASSESSMENT & PLAN: 1. CAD: Status post CABG for 3VD.  Cardiolite in 10/16 with inferior scar, no ischemia.  No chest pain.  - Continue ASA, statin, and bisoprolol.  2. Chronic systolic CHF:  Echo in 0/07 showed EF down to 20-25%.  No ischemia on Cardiolite.  Constant RV pacing may have led to decline in EF.  We considered bioprosthetic valve stenosis as cause of fall in EF, but low gradient DSE did not suggest severe stenosis.  Repeat echo today after CRT upgrade shows improved EF to 50%.     NYHA II-III. Volume status elevated in the setting of high salt diet and increased fluid intake. CORVUE ok but volume elevated on exam.   Increase torsemide to 40 mg twice a day then back to torsemide 40 mg daily. and give 2.5 mg metolazone x1.   - AKI with lisinopril, will leave off.  - Continue bisoprolol 2.5 mg twice a day. Will not increase with volume overload.  Off spiro in April.  Consider at next visit.  3. 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 Suspect that intrinsic lung disease (post-infectious pulmonary fibrosis) is a contributor to his dyspnea and hypoxemia. He is now off oxygen (just using CPAP at night).   4. Carotid stenosis: Followed at [facility], repeat dopplers 2/18. 5. Hyperlipidemia: Continue statin.  Good LDL recently.  6. AAA: s/p  surgical repair. Stable on CT 08/11/14.  7. Status post bioprosthetic AVR: Gradient across the aortic valve has been elevated. There was some concern for possible low gradient severe bioprosthetic aortic stenosis based on most recent echo, but recent low dose DSE suggests that valvular stenosis is no more than moderate.  Mean gradient 27 mmHg across bioprosthetic valve on echo today, stable.  - He will need antibiotic prophylaxis with dental work.  8. CKD: Stage III- Check BMT  9. 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  S/p Medtronic PCM 1/16.   Patient is pacemaker-dependent with complete heart block.  He had upgrade to CRT in 3/17.   10. OSA: He is on CPAP.  Continue CPAP nightly.  11. Gout- on allopurinol.  12. COPD- per pulmonary. On nebulizer per Dr Elsworth Soho.   Follow up 4 weeks with Dr [name].  Greater than 50% of the (total minutes 25) visit spent in counseling/coordination of care regarding diuretic regime and HF diet.   Ethne Jeon NP-C  12/20/2016

## 2016-12-20 NOTE — Progress Notes (Signed)
112/70 11   1 

## 2016-12-29 ENCOUNTER — Other Ambulatory Visit (HOSPITAL_COMMUNITY): Payer: Self-pay | Admitting: Cardiology

## 2017-01-01 DIAGNOSIS — N451 Epididymitis: Secondary | ICD-10-CM | POA: Diagnosis not present

## 2017-01-01 DIAGNOSIS — N318 Other neuromuscular dysfunction of bladder: Secondary | ICD-10-CM | POA: Diagnosis not present

## 2017-01-01 DIAGNOSIS — N3020 Other chronic cystitis without hematuria: Secondary | ICD-10-CM | POA: Diagnosis not present

## 2017-01-01 DIAGNOSIS — N401 Enlarged prostate with lower urinary tract symptoms: Secondary | ICD-10-CM | POA: Diagnosis not present

## 2017-01-08 ENCOUNTER — Other Ambulatory Visit: Payer: Self-pay

## 2017-01-08 ENCOUNTER — Telehealth: Payer: Self-pay | Admitting: Cardiology

## 2017-01-08 ENCOUNTER — Ambulatory Visit (INDEPENDENT_AMBULATORY_CARE_PROVIDER_SITE_OTHER): Payer: Medicare Other | Admitting: *Deleted

## 2017-01-08 DIAGNOSIS — I442 Atrioventricular block, complete: Secondary | ICD-10-CM

## 2017-01-08 DIAGNOSIS — I5042 Chronic combined systolic (congestive) and diastolic (congestive) heart failure: Secondary | ICD-10-CM

## 2017-01-08 NOTE — Telephone Encounter (Signed)
LMOVM reminding pt to send remote transmission.   

## 2017-01-09 NOTE — Progress Notes (Signed)
Remote pacemaker transmission.   

## 2017-01-10 ENCOUNTER — Ambulatory Visit (INDEPENDENT_AMBULATORY_CARE_PROVIDER_SITE_OTHER)
Admission: RE | Admit: 2017-01-10 | Discharge: 2017-01-10 | Disposition: A | Discharge status: Home / Self Care | Payer: Medicare Other | Source: Ambulatory Visit | Attending: Adult Health | Admitting: Adult Health

## 2017-01-10 ENCOUNTER — Ambulatory Visit (INDEPENDENT_AMBULATORY_CARE_PROVIDER_SITE_OTHER): Payer: Medicare Other | Admitting: Adult Health

## 2017-01-10 ENCOUNTER — Encounter: Payer: Self-pay | Admitting: Adult Health

## 2017-01-10 VITALS — BP 104/80 | HR 97 | Ht 65.5 in | Wt 231.0 lb

## 2017-01-10 DIAGNOSIS — J449 Chronic obstructive pulmonary disease, unspecified: Secondary | ICD-10-CM

## 2017-01-10 DIAGNOSIS — J9811 Atelectasis: Secondary | ICD-10-CM | POA: Diagnosis not present

## 2017-01-10 DIAGNOSIS — I6523 Occlusion and stenosis of bilateral carotid arteries: Secondary | ICD-10-CM | POA: Diagnosis not present

## 2017-01-10 DIAGNOSIS — G4733 Obstructive sleep apnea (adult) (pediatric): Secondary | ICD-10-CM | POA: Diagnosis not present

## 2017-01-10 LAB — CUP PACEART REMOTE DEVICE CHECK
Battery Remaining Longevity: 98 mo
Brady Statistic AP VS Percent: 1 %
Brady Statistic AS VS Percent: 1 %
Brady Statistic RA Percent Paced: 1 %
Date Time Interrogation Session: 20180709184902
Implantable Lead Implant Date: 20160113
Implantable Lead Implant Date: 20170322
Implantable Lead Location: 753859
Implantable Lead Model: 5076
Implantable Pulse Generator Implant Date: 20170322
Lead Channel Impedance Value: 410 Ohm
Lead Channel Impedance Value: 610 Ohm
Lead Channel Pacing Threshold Amplitude: 0.5 V
Lead Channel Pacing Threshold Amplitude: 0.5 V
Lead Channel Pacing Threshold Amplitude: 0.75 V
Lead Channel Pacing Threshold Pulse Width: 0.4 ms
Lead Channel Pacing Threshold Pulse Width: 0.4 ms
Lead Channel Pacing Threshold Pulse Width: 0.4 ms
Lead Channel Sensing Intrinsic Amplitude: 12 mV
Lead Channel Setting Pacing Amplitude: 1.5 V
Lead Channel Setting Pacing Amplitude: 2 V
Lead Channel Setting Pacing Pulse Width: 0.4 ms
Lead Channel Setting Sensing Sensitivity: 5 mV
MDC IDC LEAD IMPLANT DT: 20160113
MDC IDC LEAD LOCATION: 753858
MDC IDC LEAD LOCATION: 753860
MDC IDC MSMT BATTERY REMAINING PERCENTAGE: 95.5 %
MDC IDC MSMT BATTERY VOLTAGE: 2.98 V
MDC IDC MSMT LEADCHNL RA SENSING INTR AMPL: 3 mV
MDC IDC MSMT LEADCHNL RV IMPEDANCE VALUE: 530 Ohm
MDC IDC SET LEADCHNL LV PACING PULSEWIDTH: 0.4 ms
MDC IDC SET LEADCHNL RV PACING AMPLITUDE: 2.5 V
MDC IDC STAT BRADY AP VP PERCENT: 1 %
MDC IDC STAT BRADY AS VP PERCENT: 99 %
Pulse Gen Model: 3262
Pulse Gen Serial Number: 7838612

## 2017-01-10 MED ORDER — IPRATROPIUM-ALBUTEROL 0.5-2.5 (3) MG/3ML IN SOLN: 3.0000 mL | Freq: Four times a day (QID) | RESPIRATORY_TRACT | 3 refills | Status: DC | PRN

## 2017-01-10 NOTE — Patient Instructions (Addendum)
Continue on Duoneb Four times a day  .  Continue on CPAP At bedtime   Work on weight loss Do not drive if sleepy Chest xray today .  follow up Dr. Elsworth Soho  In 6 months and As needed

## 2017-01-10 NOTE — Progress Notes (Signed)
@Patient  ID: [name], male    DOB: [birth year], 76 y.o.   MRN: [id number]  Chief Complaint  Patient presents with  . Follow-up    COPD /OSA     Referring provider: [name], MD  HPI: 76 yo ex-heavy smoker for FU of COPD & OSA. He smoked 2-3 PPD until he quit in 07/2012  He had prolonged hospitalization in 07/2012 with H1N1 influenza and pneumococcal PNA/ ARDS requiring tracheostomy. He also had a left empyema requiring chest tube, septic shock and AKI which resolved.  He had CABG-AVR (bioprosthetic valve).   Significant tests/ events  10/23/12 PFTs reveal severe obstructive / restrictive disease &severely reduced DLCO.   PFT 01/21/13 >FEV1 58%, ratio 58, +25% change after BD , DLCO 38 .    01/11/2017 Follow up : COPD /OSA  Pt returns for 6 month follow up . He has moderate to severe COPD . Has been tried on several inhalers in past but unable to afford the inhalers. He is in the [place] hole and can not afford the inhalers. Wants to use nebs. Currently on Duoneb Four times a day  . Says it is working good.  No flare of cough or wheeizng.   Has OSA on CPAP At bedtime  . Says he feels rested with no significant datyimes sleepiness.  Wears everynight for at least 8 hr . Cant go without it.  Download shows execellent compliance with AHI 0.8. avg usage at 9hr. On 13cm H20 . + leaks. Feels mask is working good.        Allergies  Allergen Reactions  . Omnipaque [Iohexol] Other (See Comments)    Decreased kidney function  . Primaxin [Imipenem] Hives  . Cephalosporins Rash    Blisters  . Lisinopril     Drops Blood pressure  nausea   . Clindamycin/Lincomycin Swelling and Rash    Immunization History  Administered Date(s) Administered  . Influenza Split 03/13/2013  . Influenza Whole 04/02/2012  . Influenza, High Dose Seasonal PF 04/02/2016  . Influenza,inj,Quad PF,36+ Mos 03/13/2015  . Influenza-Unspecified 04/16/2014  . Pneumococcal Conjugate-13  04/03/2015  . Pneumococcal-Unspecified 03/03/2008  . Zoster 07/03/2006    Past Medical History:  Diagnosis Date  . AAA (abdominal aortic aneurysm) ([place])    5/14 CT showed > 6 cm AAA, also right iliac aneurysm. Not stent graft candidate.   . Anemia of chronic disease   . Anxiety    pt. admits that he has anxiety at times   . Aortic stenosis    Moderate to severe by echo in 4/14. Bioprosthetic #21 [facility] Ease aortic valve replacement in 4/14.   . Arthritis   . CAD (coronary artery disease)    a. 4/14 CABG: LIMA-LAD, seq SVG-ramus & OM1, SVG-PDA  . Carotid arterial disease (HCC)    Carotid dopplers (5/17) with 00-17% LICA stenosis.   . CHF (congestive heart failure) ([place]) 07/2012; 10/17/2012  . Chronic kidney disease    increased creatinine recently - being followed, near kidney failure fr. contrast dye   . Chronic systolic heart failure (HCC)    a. 1/14 ECHO: sev dil LV, EF30-35%, diff HK, mild MR, AS severe, AV grad 35 b. 8/14 ECHO: EF 55-60%, mild biopros AV sten mn grad. 25, RV mild dil, RA mild dil  . Complication of anesthesia    during last surgery had to be given special medicine b/c "something dropped" during surgery   . COPD (chronic obstructive pulmonary disease) ([place])  a.  History of heavy smoking. PFTs (4/14) with mixed obstructive (COPD) and restrictive (post-ARDS) picture.   . Diabetes mellitus without complication ([place])   . Esophageal reflux   . History of blood transfusion    "lots since January" (10/17/2012)  . Hyperlipidemia   . Hypertension   . Ischemic cardiomyopathy   . Ischemic cardiomyopathy    a. 4/14 LHC: pLAD 95, ost ramus 70-80, mLCx 90, EF 30%  . OSA (obstructive sleep apnea)    on cpap- every sleep time.   . Pneumonia 07/2012   PNA (H1N1 influenza + pneumococcus) in 8/08 complicated by respiratory failure/ARDS. Required tracheostomy, now weaned off. Had left empyema requiring chest tube.   . Prediabetes   . Prostate cancer [facility])     s/p  radiation treatment. - (PT. DENIES)Has indwelling foley.   . Shortness of breath    still goes deer hunting by himself    Tobacco History: History  Smoking Status  . Former Smoker  . Packs/day: 2.00  . Years: 58.00  . Types: Cigarettes  . Quit date: 07/20/2012  Smokeless Tobacco  . Never Used   Counseling given: Not Answered   Outpatient Encounter Prescriptions as of 01/10/2017  Medication Sig  . allopurinol (ZYLOPRIM) 100 MG tablet Take 100 mg by mouth at bedtime. Also take a 300 mg tablet during the day  . allopurinol (ZYLOPRIM) 300 MG tablet Take 300 mg by mouth daily.  Marland Kitchen aspirin EC 81 MG tablet Take 81 mg by mouth daily.  Marland Kitchen atorvastatin (LIPITOR) 80 MG tablet TAKE 1 TABLET BY MOUTH EVERY DAY BEFORE BREAKFAST  . calcitRIOL (ROCALTROL) 0.25 MCG capsule Take 0.25 mcg by mouth daily.   . fexofenadine (ALLEGRA) 180 MG tablet Take 90 mg by mouth daily.   Marland Kitchen ipratropium-albuterol (DUONEB) 0.5-2.5 (3) MG/3ML SOLN Take 3 mLs by nebulization every 6 (six) hours as needed. Dx: J44.9  . JANUVIA 100 MG tablet Take 50 mg by mouth daily.   Marland Kitchen NITROSTAT 0.4 MG SL tablet PLACE 1 TABLET UNDER TONGUE EVERY 5 MINUTES AS NEEDED FOR CHEST PAIN  . pantoprazole (PROTONIX) 40 MG tablet Take 40 mg by mouth at bedtime.   . potassium chloride (KLOR-CON M10) 10 MEQ tablet Take 2 tablets (20 mEq total) by mouth 2 (two) times daily. (Patient taking differently: Take 20 mEq by mouth daily. )  . torsemide (DEMADEX) 20 MG tablet Take 2 tablets (40 mg total) by mouth daily.  . [DISCONTINUED] ipratropium-albuterol (DUONEB) 0.5-2.5 (3) MG/3ML SOLN Take 3 mLs by nebulization every 6 (six) hours as needed.  . metolazone (ZAROXOLYN) 2.5 MG tablet Take 1 tablet (2.5 mg total) by mouth daily. (Patient not taking: Reported on 01/10/2017)  . [DISCONTINUED] ANORO ELLIPTA 62.5-25 MCG/INH AEPB INHALE 1 PUFF INTO THE LUNGS DAILY. (Patient not taking: Reported on 01/10/2017)   No facility-administered encounter medications on  file as of 01/10/2017.      Review of Systems  Constitutional:   No  weight loss, night sweats,  Fevers, chills,  +fatigue, or  lassitude.  HEENT:   No headaches,  Difficulty swallowing,  Tooth/dental problems, or  Sore throat,                No sneezing, itching, ear ache, nasal congestion, post nasal drip,   CV:  No chest pain,  Orthopnea, PND, swelling in lower extremities, anasarca, dizziness, palpitations, syncope.   GI  No heartburn, indigestion, abdominal pain, nausea, vomiting, diarrhea, change in bowel habits, loss of appetite, bloody stools.  Resp:  .  No chest wall deformity  Skin: no rash or lesions.  GU: no dysuria, change in color of urine, no urgency or frequency.  No flank pain, no hematuria   MS:  No joint pain or swelling.  No decreased range of motion.  No back pain.    Physical Exam  BP 104/80 (BP Location: Left Arm, Cuff Size: Normal)   Pulse 97   Ht 5' 5.5" (1.664 m)   Wt 231 lb (104.8 kg)   SpO2 96%   BMI 37.86 kg/m   GEN: A/Ox3; pleasant , NAD obese    HEENT:  [place]/AT,  EACs-clear, TMs-wnl, NOSE-clear, THROAT-clear, no lesions, no postnasal drip or exudate noted. Class 2-3 MP airway   NECK:  Supple w/ fair ROM; no JVD; normal carotid impulses w/o bruits; no thyromegaly or nodules palpated; no lymphadenopathy.    RESP  Clear  P & A; w/o, wheezes/ rales/ or rhonchi. no accessory muscle use, no dullness to percussion  CARD:  RRR, no m/r/g, no peripheral edema, pulses intact, no cyanosis or clubbing.  GI:   Soft & nt; nml bowel sounds; no organomegaly or masses detected.   Musco: Warm bil, no deformities or joint swelling noted.   Neuro: alert, no focal deficits noted.    Skin: Warm, no lesions or rashes    Lab Results:  CBC  BNP  Imaging: Dg Chest 2 View  Result Date: 01/10/2017 CLINICAL DATA:  History of abdominal aortic aneurysm EXAM: CHEST  2 VIEW COMPARISON:  10/01/2015 FINDINGS: Left subclavian pacemaker device and leads are  stable and intact. Aortic valve replacement hardware is stable. Postoperative changes from CABG. Lungs are under aerated with bibasilar atelectasis. Normal heart size. Upper lungs clear. There is a tiny bubble of gas at the left costophrenic angle. Loculated pneumothorax or a small pulmonary air cyst cannot be excluded. IMPRESSION: Possible tiny loculated pneumothorax or pulmonary air cyst at the left costophrenic angle Bibasilar atelectasis. Electronically Signed   By: Marybelle Killings M.D.   On: 01/10/2017 15:25     Assessment & Plan:   No problem-specific Assessment & Plan notes found for this encounter.     Rexene Edison NP -C

## 2017-01-11 NOTE — Assessment & Plan Note (Signed)
Compensated on present regimen  Chest xray today   Plan  Patient Instructions  Continue on Duoneb Four times a day  .  Continue on CPAP At bedtime   Work on weight loss Do not drive if sleepy Chest xray today .  follow up Dr. Elsworth Soho  In 6 months and As needed

## 2017-01-11 NOTE — Assessment & Plan Note (Signed)
Cont on CPAP . Doing well w/ good control and compliance .

## 2017-01-15 ENCOUNTER — Telehealth: Payer: Self-pay | Admitting: Adult Health

## 2017-01-15 ENCOUNTER — Encounter: Payer: Self-pay | Admitting: Cardiology

## 2017-01-15 DIAGNOSIS — J8410 Pulmonary fibrosis, unspecified: Secondary | ICD-10-CM

## 2017-01-15 NOTE — Telephone Encounter (Signed)
Spoke with patient. He is aware of CXR results. Will place the order for a repeat CXR. He is aware that this will need to be done in 1 month.

## 2017-01-15 NOTE — Progress Notes (Signed)
ATC, left message for patient to contact office.

## 2017-01-15 NOTE — Telephone Encounter (Signed)
Notes recorded by Melvenia Needles, NP on 01/12/2017 at 2:56 PM EDT CXR shows a very small loculated pTX vs air cyst on left'-could be chronic  He has not symptoms . Will follow  Recheck cxr in 1 month , does not need ov . Will call him with xray results  Please contact office for sooner follow up if symptoms do not improve or worsen or seek emergency care  ------------------------------- lmtcb x1 for pt.

## 2017-01-15 NOTE — Telephone Encounter (Signed)
Patient is returning phone call. Patient request for nurse to mail him a letter with results due to not able to speak with anyone on the phone.

## 2017-01-15 NOTE — Telephone Encounter (Signed)
Patient is returning phone call [phone].

## 2017-01-18 ENCOUNTER — Ambulatory Visit (HOSPITAL_COMMUNITY)
Admission: RE | Admit: 2017-01-18 | Discharge: 2017-01-18 | Disposition: A | Discharge status: Home / Self Care | Payer: Medicare Other | Source: Ambulatory Visit | Attending: Cardiology | Admitting: Cardiology

## 2017-01-18 ENCOUNTER — Encounter: Payer: Self-pay | Admitting: Cardiology

## 2017-01-18 ENCOUNTER — Encounter (HOSPITAL_COMMUNITY): Payer: Self-pay | Admitting: Cardiology

## 2017-01-18 VITALS — BP 120/77 | HR 93 | Wt 232.0 lb

## 2017-01-18 DIAGNOSIS — I6523 Occlusion and stenosis of bilateral carotid arteries: Secondary | ICD-10-CM | POA: Diagnosis not present

## 2017-01-18 DIAGNOSIS — D638 Anemia in other chronic diseases classified elsewhere: Secondary | ICD-10-CM | POA: Insufficient documentation

## 2017-01-18 DIAGNOSIS — Z923 Personal history of irradiation: Secondary | ICD-10-CM | POA: Diagnosis not present

## 2017-01-18 DIAGNOSIS — Z8546 Personal history of malignant neoplasm of prostate: Secondary | ICD-10-CM | POA: Insufficient documentation

## 2017-01-18 DIAGNOSIS — I2510 Atherosclerotic heart disease of native coronary artery without angina pectoris: Secondary | ICD-10-CM | POA: Diagnosis not present

## 2017-01-18 DIAGNOSIS — J449 Chronic obstructive pulmonary disease, unspecified: Secondary | ICD-10-CM | POA: Diagnosis not present

## 2017-01-18 DIAGNOSIS — E1122 Type 2 diabetes mellitus with diabetic chronic kidney disease: Secondary | ICD-10-CM | POA: Diagnosis not present

## 2017-01-18 DIAGNOSIS — G4733 Obstructive sleep apnea (adult) (pediatric): Secondary | ICD-10-CM | POA: Insufficient documentation

## 2017-01-18 DIAGNOSIS — I5022 Chronic systolic (congestive) heart failure: Secondary | ICD-10-CM | POA: Diagnosis not present

## 2017-01-18 DIAGNOSIS — Z7984 Long term (current) use of oral hypoglycemic drugs: Secondary | ICD-10-CM | POA: Insufficient documentation

## 2017-01-18 DIAGNOSIS — Z951 Presence of aortocoronary bypass graft: Secondary | ICD-10-CM | POA: Insufficient documentation

## 2017-01-18 DIAGNOSIS — M109 Gout, unspecified: Secondary | ICD-10-CM | POA: Diagnosis not present

## 2017-01-18 DIAGNOSIS — Z87891 Personal history of nicotine dependence: Secondary | ICD-10-CM | POA: Diagnosis not present

## 2017-01-18 DIAGNOSIS — Z7982 Long term (current) use of aspirin: Secondary | ICD-10-CM | POA: Insufficient documentation

## 2017-01-18 DIAGNOSIS — R918 Other nonspecific abnormal finding of lung field: Secondary | ICD-10-CM | POA: Diagnosis not present

## 2017-01-18 DIAGNOSIS — N183 Chronic kidney disease, stage 3 unspecified: Secondary | ICD-10-CM

## 2017-01-18 DIAGNOSIS — I442 Atrioventricular block, complete: Secondary | ICD-10-CM

## 2017-01-18 DIAGNOSIS — Z953 Presence of xenogenic heart valve: Secondary | ICD-10-CM | POA: Insufficient documentation

## 2017-01-18 DIAGNOSIS — I130 Hypertensive heart and chronic kidney disease with heart failure and stage 1 through stage 4 chronic kidney disease, or unspecified chronic kidney disease: Secondary | ICD-10-CM | POA: Insufficient documentation

## 2017-01-18 DIAGNOSIS — Z8701 Personal history of pneumonia (recurrent): Secondary | ICD-10-CM | POA: Diagnosis not present

## 2017-01-18 DIAGNOSIS — I5042 Chronic combined systolic (congestive) and diastolic (congestive) heart failure: Secondary | ICD-10-CM | POA: Diagnosis not present

## 2017-01-18 DIAGNOSIS — Z950 Presence of cardiac pacemaker: Secondary | ICD-10-CM | POA: Diagnosis not present

## 2017-01-18 DIAGNOSIS — Z79899 Other long term (current) drug therapy: Secondary | ICD-10-CM | POA: Diagnosis not present

## 2017-01-18 DIAGNOSIS — I255 Ischemic cardiomyopathy: Secondary | ICD-10-CM | POA: Diagnosis not present

## 2017-01-18 DIAGNOSIS — E785 Hyperlipidemia, unspecified: Secondary | ICD-10-CM | POA: Insufficient documentation

## 2017-01-18 DIAGNOSIS — Z91041 Radiographic dye allergy status: Secondary | ICD-10-CM | POA: Insufficient documentation

## 2017-01-18 LAB — LIPID PANEL
CHOL/HDL RATIO: 5 ratio
Cholesterol: 110 mg/dL (ref 0–200)
HDL: 22 mg/dL — AB (ref >40)
LDL CALC: 58 mg/dL (ref 0–99)
Triglycerides: 150 mg/dL — ABNORMAL HIGH (ref <150)
VLDL: 30 mg/dL (ref 0–40)

## 2017-01-18 LAB — BASIC METABOLIC PANEL
ANION GAP: 8 (ref 5–15)
BUN: 34 mg/dL — AB (ref 6–20)
CALCIUM: 9.2 mg/dL (ref 8.9–10.3)
CO2: 27 mmol/L (ref 22–32)
Chloride: 105 mmol/L (ref 101–111)
Creatinine, Ser: 1.64 mg/dL — ABNORMAL HIGH (ref 0.61–1.24)
GFR calc Af Amer: 46 mL/min — ABNORMAL LOW (ref >60)
GFR calc non Af Amer: 40 mL/min — ABNORMAL LOW (ref >60)
GLUCOSE: 94 mg/dL (ref 65–99)
Potassium: 4.3 mmol/L (ref 3.5–5.1)
Sodium: 140 mmol/L (ref 135–145)

## 2017-01-18 MED ORDER — BISOPROLOL FUMARATE 5 MG PO TABS
2.5000 mg | ORAL_TABLET | Freq: Every day | ORAL | 3 refills | Status: DC
Start: 2017-01-18 — End: 2018-02-13

## 2017-01-18 NOTE — Patient Instructions (Addendum)
Routine lab work today. Will notify you of abnormal results, otherwise no news is good news!  START Bisoprolol 2.5 mg (1/2 tablet) once daily at bedtime.  Will schedule you for an echocardiogram at [facility] in September. Address: [address] ([place]), [place], [place] [address]  Phone: [phone]  Follow up 3 months with Dr. [name]. Take all medication as prescribed the day of your appointment. Bring all medications with you to your appointment.  Do the following things EVERYDAY: 1) Weigh yourself in the morning before breakfast. Write it down and keep it in a log. 2) Take your medicines as prescribed 3) Eat low salt foods-Limit salt (sodium) to 2000 mg per day.  4) Stay as active as you can everyday 5) Limit all fluids for the day to less than 2 liters

## 2017-01-20 ENCOUNTER — Other Ambulatory Visit: Payer: Self-pay | Admitting: Cardiology

## 2017-01-20 NOTE — Progress Notes (Signed)
Advanced Heart Failure Note   Patient ID: [name], male   DOB: [birth year], 76 y.o.   MRN: [id number] PCP: Dr. Jilda Pierce Nephrologist: Dr [name] Vascular: Dr [name] HF: [name] Pulmonary: Dr [name]  76 yo with history of ARDS/respiratory failure and tracheostomy in 07/2012 as well as DM, HTN, and COPD presented to the ER at [facility] with CHF in 10/2012. Patient had a prolonged hospitalization in 07/2012 with H1N1 influenza and pneumococcal PNA. This progressed to ARDS. He was intubated and ended up with tracheostomy. He had the trach removed. He also had a left empyema requiring chest tube, septic shock with elevated troponin, and AKI which resolved.  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   He was admitted again in 10/2012 from his nursing home with exertional dyspnea and orthopnea. He had gained 21 lbs. Echo at admission showed EF 35% with global hypokinesis that looked worse in the anteroseptal wall. He also was noted to have aortic stenosis rated as moderate to severe. He was diuresed and cathed, showing severe 3 vessel disease. He then had CABG-AVR (bioprosthetic valve).   He had a large AAA and underwent surgical repair in 07/2013.  He had AKI and Pseudomonas PNA post-operatively.  He was discharged to a nursing home. Echo in 1/15 showed EF 55-60% with mildly dilated and dysfunctional RV and a bioprosthetic aortic valve with mean gradient 32 mmHg (higher than expected).   Repeat limited echo in 08/2013 showed shower aortic valve mean gradient of 21 mmHg.   In 11/15, patient had a holter monitor placed for bradycardia.  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  Baseline bifascicular block.  His HR also was noted to drop into the 40s with exercise, suggesting exercise-associated heart block.  He underwent Medtronic-PCM placement in 1/16 by Dr [name].   Echo in 10/16 showed fall in EF to 30% with mean aortic valve gradient 21 mmHg.  Repeat echo in 2/17 showed EF  20-25%, mean aortic valve gradient 24 mmHg with moderately decreased RV systolic function.  Cardiolite in 10/16 showed inferior scar with no ischemia.  He is RV pacing almost all the time.  Low dose DSE was done in 2/17 to assess for significant bioprosthetic valve stenosis => mean gradient with dobutamine was only 31 mmHg, so suspect no more than moderate stenosis.  He had St Jude CRT upgrade in 3/17.  He decided against ICD.  Repeat echo post-CRT in 8/17 showed EF 50%, diffuse hypokinesis, bioprosthetic aortic valve with mean gradient 27 mmHg.    Today he returns for HF followup. Breathing is stable.  He uses Duonebs regularly.  No problems walking on flat ground.  He is short of breath walking up a hill or walking fast up stairs.  No chest pain.  No lightheadedness.  No orthopnea/PND.    Corevue reviewed: >99% BiV paced, impedance stable.   Labs (2/15): K 4, creatinine 1.6, BNP 166 Labs (3/15): Hgb 11.6, pro-BNP 1037, creatinine 1.76, BUN 30, LDL 75, HDL 26 Labs (8/15): K 3.9, creatinine 1.55, HCT 40.6, LDL 71, HDL 27 Labs (1/16): K 4.3, Creatinine 1.6, HCT 42.9 Labs (2/17): K 4.0 Creatinine 1.64, TSH normal.  Labs (3/17): K 4.5, creatinine 1.49, HCT 42.9 Labs (4/17): K 4, creatinine 1.48, BNP 125 Labs (6/17): LDL 27, HDL 24, TGs 245 Labs (7/17): K 4.9, creatinine 1.71 Labs (8/17): K 4.3, creatinine 1.7, LDL 64, HDl 25, TGs 198 Lab (06/16/2016): K 4.9 Creatinine 1.84  Labs (10/2016): Creatinine 1.5 K 3.9  Labs (6/18): K 4.4, creatinine 1.57, BNP 55  1. PNA (H1N1 influenza + pneumococcus) in 5/62 complicated by respiratory failure/ARDS. Required tracheostomy, now weaned off. Had left empyema requiring chest tube.  Has developed post-infectious pulmonary fibrosis.  2. Prostate CA s/p radiation treatment. Has indwelling foley.  3. OSA: using CPAP 4. HTN  5. Hyperlipidemia  6. COPD: History of heavy smoking. PFTs (4/14) with mixed obstructive (COPD) and restrictive (post-ARDS) picture.  7.  Type II diabetes  8. Anemia of chronic disease.  9. Ischemic cardiomyopathy:  - Echo (1/14) with severely dilated LV, EF 30-35%, diffuse hypokinesis worse in the anteroseptal wall, grade II diastolic dysfunction, mild Vincent, AS interpreted as "moderate to severe" with aortic valve mean gradient 23 mmHg.  - Echo (4/14) witih EF 35-40%, mid to apical anteroseptal akinesis, moderate to severe AS with mean gradient 33 mmHg, AVA 0.91 cm^2.  -Echo (5/14) post CABG showed EF 50%, anteroseptal hypokinesis, bioprosthetic aortic valve well-seated. - Echo (1/15) with EF 55-60%, mild LVH, mildly dilated RV with mildly decreased systolic function, D-shaped interventricular septum, bioprosthetic aortic valve with mean gardient 32 mmHg.   - Echo (3/15) with EF 55-60%, mild LVH, bioprosthetic aortic valve with mean gradient 21 mmHg (lower), no AI, RV moderately dilated with mild to moderately decreased systolic function.   - Echo (10/16) with EF 30%, bioprosthetic aortic valve with mean gradient 21 mmHg.   - Echo (2/17) with EF 20-25%, mild LVH, aortic valve mean gradient 24 mmHg/peak 39 mmHg, RV moderately dilated with moderately decreased systolic function.  - St Jude CRT upgrade in 3/17 (did not want ICD).  - Echo (8/17) with EF 50%, diffuse hypokinesis, bioprosthetic aortic valve with mean gradient 27 mmHg, normal RV size with mildly decreased systolic function.   - AKI with lisinopril.  10. Aortic stenosis: Moderate to severe by echo in 4/14. Bioprosthetic #21 [facility] Ease aortic valve replacement in 4/14.  Low dose DSE (2/17) showed that [name] likely has no more than moderate bioprosthetic valve stenosis with mean gradient up to 31 mmHg with dobutamine.  11. CAD: LHC (4/14) with 95% pLAD, 70-80% ostial ramus, 70% mLCx, 90% pRCA, EF 35%.  - CABG 4/14 with LIMA-LAD, seq SVG-ramus and OM1, SVG-PDA.  - Cardiolite (10/16) with EF 42%, fixed inferior defect, no ischemia.   12. Carotid stenosis: Carotid  dopplers (1/30) with 86-57% LICA stenosis.  Carotids (8/46) with 96-29% LICA stenosis. Carotids (5/28) with 41-32% LICA stenosis.  - Carotid dopplers (4/40): 10-27% LICA stenosis.  13. AAA: 5/14 CT showed > 6 cm AAA, also right iliac aneurysm. Not stent graft candidate.  7/14 CT showed 7 cm AAA.  Surgical repair 1/15.  14. CKD: AKI in 7/14 from contrast nephropathy.  AKI in 1/15 after AAA repair.  15. Contrast allergy.  16. ABIs 9/14 were normal.  17. Gout 18. 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  HR drops with exercise (likely exercise-induced heart block).  Medtronic dual chamber PPM placed in 2016.  Recent pacemaker interrogation showed complete heart block.  He is pacemaker-dependent, upgraded to CRT in 3/17.   SH: Quit smoking in 1/14, married, no ETOH, lives in [place].   FH: No premature CAD  ROS: All systems reviewed and negative except as per HPI.    Current Outpatient Prescriptions  Medication Sig Dispense Refill  . allopurinol (ZYLOPRIM) 100 MG tablet Take 100 mg by mouth at bedtime. Also take a 300 mg tablet during the day    .  allopurinol (ZYLOPRIM) 300 MG tablet Take 300 mg by mouth daily.    Marland Kitchen aspirin EC 81 MG tablet Take 81 mg by mouth daily.    Marland Kitchen atorvastatin (LIPITOR) 80 MG tablet TAKE 1 TABLET BY MOUTH EVERY DAY BEFORE BREAKFAST 30 tablet 3  . calcitRIOL (ROCALTROL) 0.25 MCG capsule Take 0.25 mcg by mouth daily.     . fexofenadine (ALLEGRA) 180 MG tablet Take 90 mg by mouth daily.     Marland Kitchen ipratropium-albuterol (DUONEB) 0.5-2.5 (3) MG/3ML SOLN Take 3 mLs by nebulization every 6 (six) hours as needed. Dx: J44.9 360 mL 3  . JANUVIA 100 MG tablet Take 50 mg by mouth daily.   6  . NITROSTAT 0.4 MG SL tablet PLACE 1 TABLET UNDER TONGUE EVERY 5 MINUTES AS NEEDED FOR CHEST PAIN 25 tablet 0  . pantoprazole (PROTONIX) 40 MG tablet Take 40 mg by mouth at bedtime.     . potassium chloride (KLOR-CON M10) 10 MEQ tablet Take 2 tablets (20 mEq total) by  mouth 2 (two) times daily. (Patient taking differently: Take 20 mEq by mouth daily. ) 360 tablet 1  . torsemide (DEMADEX) 20 MG tablet Take 2 tablets (40 mg total) by mouth daily. 90 tablet 3  . bisoprolol (ZEBETA) 5 MG tablet Take 0.5 tablets (2.5 mg total) by mouth daily. 45 tablet 3  . metolazone (ZAROXOLYN) 2.5 MG tablet Take 2.5 mg by mouth as needed. Only as directed     No current facility-administered medications for this encounter.     Vitals:   01/18/17 1144  BP: 120/77  Pulse: 93  SpO2: 96%  Weight: 232 lb (105.2 kg)    Wt Readings from Last 3 Encounters:  01/18/17 232 lb (105.2 kg)  01/10/17 231 lb (104.8 kg)  12/20/16 236 lb 9.6 oz (107.3 kg)    PHYSICAL EXAM General: NAD Neck: No JVD, no thyromegaly or thyroid nodule.  Lungs: Clear to auscultation bilaterally with normal respiratory effort. CV: Nondisplaced PMI.  Heart regular S1/S2, no S3/S4, 2/6 SEM RUSB with clear S2.  Trace ankle edema.  No carotid bruit.  Normal pedal pulses.  Abdomen: Soft, nontender, no hepatosplenomegaly, mild distention.  Skin: Intact without lesions or rashes.  Neurologic: Alert and oriented x 3.  Psych: Normal affect. Extremities: No clubbing or cyanosis.  HEENT: Normal.   ASSESSMENT & PLAN: 1. CAD: Status post CABG for 3VD.  Cardiolite in 10/16 with inferior scar, no ischemia.  No chest pain.  - Continue ASA, statin.  2. Chronic systolic CHF:  Echo in 1/24 showed EF down to 20-25%.  No ischemia on Cardiolite.  Constant RV pacing may have led to decline in EF.  We considered bioprosthetic valve stenosis as cause of fall in EF, but low gradient DSE did not suggest severe stenosis.  Echo 8/17 after CRT upgrade shows improved EF to 50%.  NYHA class II-III symptoms, stable.  Not volume overloaded on exam or by Corevue.  - Restart bisoprolol 2.5 mg qhs (tends to get orthostatic if takes during the day).   - AKI with lisinopril, will leave off.  - Continue torsemide 40 mg daily. BMET today.    - Needs repeat echo in 8/18 to reassess LV EF and follow aortic stenosis.   3. 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 Suspect that intrinsic lung disease (post-infectious pulmonary fibrosis) is a contributor to his dyspnea and hypoxemia. He is now off oxygen (just using CPAP at night).   4. Carotid stenosis: Followed at [facility],  repeat dopplers 2/19. 5. Hyperlipidemia: Continue statin. Check lipids today.  6. AAA: s/p surgical repair. 7. Status post bioprosthetic AVR: Gradient across the aortic valve has been elevated. There was some concern for possible low gradient severe bioprosthetic aortic stenosis based on past echo, but low dose DSE suggests that valvular stenosis was no more than moderate.  Mean gradient 27 mmHg across bioprosthetic valve on echo 8/17.  - He will need antibiotic prophylaxis with dental work.  - Echo to follow aortic valve in 8/18.  8. CKD: Stage III. Check BMET today.  9. 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  S/p Medtronic PCM 1/16.   Patient is pacemaker-dependent with complete heart block.  He had upgrade to CRT in 3/17.   10. OSA: He is on CPAP.  Continue CPAP nightly.  11. Gout: on allopurinol.  12. COPD:  per pulmonary.   Followup 3 months.   Loralie Champagne 01/20/2017

## 2017-02-08 DIAGNOSIS — N183 Chronic kidney disease, stage 3 (moderate): Secondary | ICD-10-CM | POA: Diagnosis not present

## 2017-02-08 DIAGNOSIS — N2581 Secondary hyperparathyroidism of renal origin: Secondary | ICD-10-CM | POA: Diagnosis not present

## 2017-02-08 DIAGNOSIS — D631 Anemia in chronic kidney disease: Secondary | ICD-10-CM | POA: Diagnosis not present

## 2017-02-08 DIAGNOSIS — I2510 Atherosclerotic heart disease of native coronary artery without angina pectoris: Secondary | ICD-10-CM | POA: Diagnosis not present

## 2017-02-08 DIAGNOSIS — M109 Gout, unspecified: Secondary | ICD-10-CM | POA: Diagnosis not present

## 2017-02-08 DIAGNOSIS — I129 Hypertensive chronic kidney disease with stage 1 through stage 4 chronic kidney disease, or unspecified chronic kidney disease: Secondary | ICD-10-CM | POA: Diagnosis not present

## 2017-02-08 DIAGNOSIS — Z950 Presence of cardiac pacemaker: Secondary | ICD-10-CM | POA: Diagnosis not present

## 2017-02-08 DIAGNOSIS — I350 Nonrheumatic aortic (valve) stenosis: Secondary | ICD-10-CM | POA: Diagnosis not present

## 2017-02-08 DIAGNOSIS — I255 Ischemic cardiomyopathy: Secondary | ICD-10-CM | POA: Diagnosis not present

## 2017-02-08 DIAGNOSIS — E782 Mixed hyperlipidemia: Secondary | ICD-10-CM | POA: Diagnosis not present

## 2017-02-08 DIAGNOSIS — E1129 Type 2 diabetes mellitus with other diabetic kidney complication: Secondary | ICD-10-CM | POA: Diagnosis not present

## 2017-02-13 ENCOUNTER — Ambulatory Visit (INDEPENDENT_AMBULATORY_CARE_PROVIDER_SITE_OTHER)
Admission: RE | Admit: 2017-02-13 | Discharge: 2017-02-13 | Disposition: A | Discharge status: Home / Self Care | Payer: Medicare Other | Source: Ambulatory Visit | Attending: Adult Health | Admitting: Adult Health

## 2017-02-13 DIAGNOSIS — G4733 Obstructive sleep apnea (adult) (pediatric): Secondary | ICD-10-CM | POA: Diagnosis not present

## 2017-02-13 DIAGNOSIS — I2510 Atherosclerotic heart disease of native coronary artery without angina pectoris: Secondary | ICD-10-CM | POA: Diagnosis not present

## 2017-02-13 DIAGNOSIS — J8410 Pulmonary fibrosis, unspecified: Secondary | ICD-10-CM | POA: Diagnosis not present

## 2017-02-13 DIAGNOSIS — E119 Type 2 diabetes mellitus without complications: Secondary | ICD-10-CM | POA: Diagnosis not present

## 2017-02-13 DIAGNOSIS — Z6839 Body mass index (BMI) 39.0-39.9, adult: Secondary | ICD-10-CM | POA: Diagnosis not present

## 2017-02-13 DIAGNOSIS — I119 Hypertensive heart disease without heart failure: Secondary | ICD-10-CM | POA: Diagnosis not present

## 2017-02-13 DIAGNOSIS — Z950 Presence of cardiac pacemaker: Secondary | ICD-10-CM | POA: Diagnosis not present

## 2017-02-13 DIAGNOSIS — N183 Chronic kidney disease, stage 3 (moderate): Secondary | ICD-10-CM | POA: Diagnosis not present

## 2017-02-20 DIAGNOSIS — L300 Nummular dermatitis: Secondary | ICD-10-CM | POA: Diagnosis not present

## 2017-02-21 ENCOUNTER — Other Ambulatory Visit: Payer: Self-pay | Admitting: Internal Medicine

## 2017-03-13 DIAGNOSIS — H02103 Unspecified ectropion of right eye, unspecified eyelid: Secondary | ICD-10-CM | POA: Diagnosis not present

## 2017-03-14 ENCOUNTER — Other Ambulatory Visit: Payer: Self-pay

## 2017-03-14 ENCOUNTER — Ambulatory Visit (HOSPITAL_COMMUNITY): Payer: Medicare Other | Attending: Cardiovascular Disease

## 2017-03-14 DIAGNOSIS — I420 Dilated cardiomyopathy: Secondary | ICD-10-CM | POA: Insufficient documentation

## 2017-03-14 DIAGNOSIS — I5030 Unspecified diastolic (congestive) heart failure: Secondary | ICD-10-CM | POA: Diagnosis not present

## 2017-03-14 DIAGNOSIS — I5022 Chronic systolic (congestive) heart failure: Secondary | ICD-10-CM

## 2017-03-14 DIAGNOSIS — Z952 Presence of prosthetic heart valve: Secondary | ICD-10-CM | POA: Insufficient documentation

## 2017-03-15 ENCOUNTER — Telehealth (HOSPITAL_COMMUNITY): Payer: Self-pay | Admitting: *Deleted

## 2017-03-15 NOTE — Telephone Encounter (Signed)
ECHOCARDIOGRAM COMPLETE  Order: [id number]  Status:  Final result Visible to patient:  Yes (MyChart) Dx:  Chronic systolic heart failure ([place])  Notes recorded by Darron Doom, RN on 03/15/2017 at 3:49 PM EDT Called and spoke with patient, he is aware and no further questions. ------  Notes recorded by Larey Dresser, MD on 03/14/2017 at 4:14 PM EDT EF 50-55%. Mean AoV gradient 25 mmHg, stable.

## 2017-04-02 DIAGNOSIS — L300 Nummular dermatitis: Secondary | ICD-10-CM | POA: Diagnosis not present

## 2017-04-02 DIAGNOSIS — I8310 Varicose veins of unspecified lower extremity with inflammation: Secondary | ICD-10-CM | POA: Diagnosis not present

## 2017-04-06 DIAGNOSIS — Z23 Encounter for immunization: Secondary | ICD-10-CM | POA: Diagnosis not present

## 2017-04-18 ENCOUNTER — Ambulatory Visit (INDEPENDENT_AMBULATORY_CARE_PROVIDER_SITE_OTHER): Payer: Medicare Other | Admitting: *Deleted

## 2017-04-18 DIAGNOSIS — I5022 Chronic systolic (congestive) heart failure: Secondary | ICD-10-CM

## 2017-04-18 DIAGNOSIS — I442 Atrioventricular block, complete: Secondary | ICD-10-CM

## 2017-04-19 NOTE — Progress Notes (Signed)
Remote pacemaker transmission.   

## 2017-04-20 ENCOUNTER — Encounter: Payer: Self-pay | Admitting: Cardiology

## 2017-05-09 ENCOUNTER — Other Ambulatory Visit (HOSPITAL_COMMUNITY): Payer: Self-pay | Admitting: Internal Medicine

## 2017-05-10 LAB — CUP PACEART REMOTE DEVICE CHECK
Battery Remaining Longevity: 92 mo
Battery Remaining Percentage: 95.5 %
Battery Voltage: 2.96 V
Brady Statistic RA Percent Paced: 1 %
Implantable Lead Implant Date: 20170322
Implantable Lead Location: 753858
Implantable Lead Location: 753860
Implantable Lead Model: 5076
Implantable Lead Model: 5076
Implantable Pulse Generator Implant Date: 20170322
Lead Channel Impedance Value: 400 Ohm
Lead Channel Impedance Value: 460 Ohm
Lead Channel Impedance Value: 590 Ohm
Lead Channel Pacing Threshold Amplitude: 0.5 V
Lead Channel Pacing Threshold Pulse Width: 0.4 ms
Lead Channel Sensing Intrinsic Amplitude: 12 mV
Lead Channel Sensing Intrinsic Amplitude: 4 mV
Lead Channel Setting Pacing Amplitude: 1.5 V
Lead Channel Setting Pacing Amplitude: 2 V
Lead Channel Setting Pacing Amplitude: 2.5 V
Lead Channel Setting Pacing Pulse Width: 0.4 ms
MDC IDC LEAD IMPLANT DT: 20160113
MDC IDC LEAD IMPLANT DT: 20160113
MDC IDC LEAD LOCATION: 753859
MDC IDC MSMT LEADCHNL LV PACING THRESHOLD AMPLITUDE: 0.75 V
MDC IDC MSMT LEADCHNL LV PACING THRESHOLD PULSEWIDTH: 0.4 ms
MDC IDC MSMT LEADCHNL RV PACING THRESHOLD AMPLITUDE: 0.5 V
MDC IDC MSMT LEADCHNL RV PACING THRESHOLD PULSEWIDTH: 0.4 ms
MDC IDC PG SERIAL: 7838612
MDC IDC SESS DTM: 20181017120055
MDC IDC SET LEADCHNL RV PACING PULSEWIDTH: 0.4 ms
MDC IDC SET LEADCHNL RV SENSING SENSITIVITY: 5 mV
MDC IDC STAT BRADY AP VP PERCENT: 1 %
MDC IDC STAT BRADY AP VS PERCENT: 1 %
MDC IDC STAT BRADY AS VP PERCENT: 99 %
MDC IDC STAT BRADY AS VS PERCENT: 1 %

## 2017-05-11 ENCOUNTER — Encounter: Payer: Self-pay | Admitting: Internal Medicine

## 2017-05-13 ENCOUNTER — Other Ambulatory Visit (HOSPITAL_COMMUNITY): Payer: Self-pay | Admitting: Cardiology

## 2017-05-14 DIAGNOSIS — G4733 Obstructive sleep apnea (adult) (pediatric): Secondary | ICD-10-CM | POA: Diagnosis not present

## 2017-05-14 DIAGNOSIS — I2510 Atherosclerotic heart disease of native coronary artery without angina pectoris: Secondary | ICD-10-CM | POA: Diagnosis not present

## 2017-05-14 DIAGNOSIS — N183 Chronic kidney disease, stage 3 (moderate): Secondary | ICD-10-CM | POA: Diagnosis not present

## 2017-05-14 DIAGNOSIS — C61 Malignant neoplasm of prostate: Secondary | ICD-10-CM | POA: Diagnosis not present

## 2017-05-14 DIAGNOSIS — Z0000 Encounter for general adult medical examination without abnormal findings: Secondary | ICD-10-CM | POA: Diagnosis not present

## 2017-05-14 DIAGNOSIS — E119 Type 2 diabetes mellitus without complications: Secondary | ICD-10-CM | POA: Diagnosis not present

## 2017-05-14 DIAGNOSIS — I119 Hypertensive heart disease without heart failure: Secondary | ICD-10-CM | POA: Diagnosis not present

## 2017-05-16 DIAGNOSIS — H9212 Otorrhea, left ear: Secondary | ICD-10-CM | POA: Diagnosis not present

## 2017-05-16 DIAGNOSIS — H6121 Impacted cerumen, right ear: Secondary | ICD-10-CM | POA: Diagnosis not present

## 2017-06-04 DIAGNOSIS — H9212 Otorrhea, left ear: Secondary | ICD-10-CM | POA: Diagnosis not present

## 2017-07-11 DIAGNOSIS — Z950 Presence of cardiac pacemaker: Secondary | ICD-10-CM | POA: Diagnosis not present

## 2017-07-11 DIAGNOSIS — I350 Nonrheumatic aortic (valve) stenosis: Secondary | ICD-10-CM | POA: Diagnosis not present

## 2017-07-11 DIAGNOSIS — I2510 Atherosclerotic heart disease of native coronary artery without angina pectoris: Secondary | ICD-10-CM | POA: Diagnosis not present

## 2017-07-11 DIAGNOSIS — N2581 Secondary hyperparathyroidism of renal origin: Secondary | ICD-10-CM | POA: Diagnosis not present

## 2017-07-11 DIAGNOSIS — I255 Ischemic cardiomyopathy: Secondary | ICD-10-CM | POA: Diagnosis not present

## 2017-07-11 DIAGNOSIS — E1122 Type 2 diabetes mellitus with diabetic chronic kidney disease: Secondary | ICD-10-CM | POA: Diagnosis not present

## 2017-07-11 DIAGNOSIS — E782 Mixed hyperlipidemia: Secondary | ICD-10-CM | POA: Diagnosis not present

## 2017-07-11 DIAGNOSIS — D631 Anemia in chronic kidney disease: Secondary | ICD-10-CM | POA: Diagnosis not present

## 2017-07-11 DIAGNOSIS — M109 Gout, unspecified: Secondary | ICD-10-CM | POA: Diagnosis not present

## 2017-07-11 DIAGNOSIS — N183 Chronic kidney disease, stage 3 (moderate): Secondary | ICD-10-CM | POA: Diagnosis not present

## 2017-07-11 DIAGNOSIS — I129 Hypertensive chronic kidney disease with stage 1 through stage 4 chronic kidney disease, or unspecified chronic kidney disease: Secondary | ICD-10-CM | POA: Diagnosis not present

## 2017-07-18 ENCOUNTER — Ambulatory Visit (INDEPENDENT_AMBULATORY_CARE_PROVIDER_SITE_OTHER): Payer: Medicare Other | Admitting: *Deleted

## 2017-07-18 DIAGNOSIS — I442 Atrioventricular block, complete: Secondary | ICD-10-CM

## 2017-07-18 DIAGNOSIS — I5022 Chronic systolic (congestive) heart failure: Secondary | ICD-10-CM

## 2017-07-19 NOTE — Progress Notes (Signed)
Remote pacemaker transmission.   

## 2017-07-20 ENCOUNTER — Encounter: Payer: Self-pay | Admitting: Cardiology

## 2017-07-22 ENCOUNTER — Other Ambulatory Visit: Payer: Self-pay | Admitting: Adult Health

## 2017-07-24 LAB — CUP PACEART REMOTE DEVICE CHECK
Battery Remaining Longevity: 98 mo
Battery Voltage: 2.98 V
Brady Statistic AP VS Percent: 1 %
Brady Statistic AS VP Percent: 99 %
Brady Statistic AS VS Percent: 1 %
Implantable Lead Implant Date: 20160113
Implantable Lead Implant Date: 20170322
Implantable Lead Location: 753858
Implantable Lead Location: 753859
Implantable Lead Location: 753860
Implantable Lead Model: 5076
Lead Channel Impedance Value: 410 Ohm
Lead Channel Impedance Value: 490 Ohm
Lead Channel Impedance Value: 630 Ohm
Lead Channel Pacing Threshold Amplitude: 0.5 V
Lead Channel Pacing Threshold Amplitude: 0.5 V
Lead Channel Pacing Threshold Pulse Width: 0.4 ms
Lead Channel Pacing Threshold Pulse Width: 0.4 ms
Lead Channel Pacing Threshold Pulse Width: 0.4 ms
Lead Channel Sensing Intrinsic Amplitude: 6.1 mV
Lead Channel Setting Pacing Amplitude: 1.5 V
Lead Channel Setting Pacing Amplitude: 2 V
Lead Channel Setting Pacing Amplitude: 2.5 V
Lead Channel Setting Pacing Pulse Width: 0.4 ms
Lead Channel Setting Pacing Pulse Width: 0.4 ms
MDC IDC LEAD IMPLANT DT: 20160113
MDC IDC MSMT BATTERY REMAINING PERCENTAGE: 95.5 %
MDC IDC MSMT LEADCHNL LV PACING THRESHOLD AMPLITUDE: 0.875 V
MDC IDC MSMT LEADCHNL RA SENSING INTR AMPL: 5 mV
MDC IDC PG IMPLANT DT: 20170322
MDC IDC PG SERIAL: 7838612
MDC IDC SESS DTM: 20190116085450
MDC IDC SET LEADCHNL RV SENSING SENSITIVITY: 5 mV
MDC IDC STAT BRADY AP VP PERCENT: 1 %
MDC IDC STAT BRADY RA PERCENT PACED: 1 %

## 2017-08-02 ENCOUNTER — Encounter: Payer: Self-pay | Admitting: Internal Medicine

## 2017-08-06 DIAGNOSIS — G4733 Obstructive sleep apnea (adult) (pediatric): Secondary | ICD-10-CM | POA: Diagnosis not present

## 2017-08-06 DIAGNOSIS — I2510 Atherosclerotic heart disease of native coronary artery without angina pectoris: Secondary | ICD-10-CM | POA: Diagnosis not present

## 2017-08-06 DIAGNOSIS — I119 Hypertensive heart disease without heart failure: Secondary | ICD-10-CM | POA: Diagnosis not present

## 2017-08-06 DIAGNOSIS — M109 Gout, unspecified: Secondary | ICD-10-CM | POA: Diagnosis not present

## 2017-08-06 DIAGNOSIS — E559 Vitamin D deficiency, unspecified: Secondary | ICD-10-CM | POA: Diagnosis not present

## 2017-08-06 DIAGNOSIS — E119 Type 2 diabetes mellitus without complications: Secondary | ICD-10-CM | POA: Diagnosis not present

## 2017-08-06 DIAGNOSIS — N183 Chronic kidney disease, stage 3 (moderate): Secondary | ICD-10-CM | POA: Diagnosis not present

## 2017-08-07 ENCOUNTER — Encounter: Payer: Self-pay | Admitting: Internal Medicine

## 2017-08-27 ENCOUNTER — Ambulatory Visit (INDEPENDENT_AMBULATORY_CARE_PROVIDER_SITE_OTHER): Payer: Medicare Other | Admitting: Family

## 2017-08-27 ENCOUNTER — Encounter: Payer: Self-pay | Admitting: Family

## 2017-08-27 ENCOUNTER — Ambulatory Visit (HOSPITAL_COMMUNITY)
Admission: RE | Admit: 2017-08-27 | Discharge: 2017-08-27 | Disposition: A | Discharge status: Home / Self Care | Payer: Medicare Other | Source: Ambulatory Visit | Attending: Family | Admitting: Family

## 2017-08-27 VITALS — BP 109/70 | HR 76 | Temp 97.3°F | Resp 16 | Wt 244.2 lb

## 2017-08-27 DIAGNOSIS — I6523 Occlusion and stenosis of bilateral carotid arteries: Secondary | ICD-10-CM | POA: Diagnosis not present

## 2017-08-27 DIAGNOSIS — Z87891 Personal history of nicotine dependence: Secondary | ICD-10-CM | POA: Insufficient documentation

## 2017-08-27 DIAGNOSIS — Z794 Long term (current) use of insulin: Secondary | ICD-10-CM | POA: Insufficient documentation

## 2017-08-27 DIAGNOSIS — E119 Type 2 diabetes mellitus without complications: Secondary | ICD-10-CM | POA: Insufficient documentation

## 2017-08-27 DIAGNOSIS — I2510 Atherosclerotic heart disease of native coronary artery without angina pectoris: Secondary | ICD-10-CM | POA: Insufficient documentation

## 2017-08-27 LAB — VAS US CAROTID
LCCADDIAS: 16 cm/s
LCCADSYS: 49 cm/s
LCCAPSYS: 51 cm/s
LEFT ECA DIAS: -20 cm/s
LEFT VERTEBRAL DIAS: 10 cm/s
LICADSYS: -64 cm/s
Left CCA prox dias: 15 cm/s
Left ICA dist dias: -24 cm/s
Left ICA prox dias: -45 cm/s
Left ICA prox sys: -173 cm/s
RIGHT CCA MID DIAS: 22 cm/s
RIGHT ECA DIAS: -21 cm/s
RIGHT VERTEBRAL DIAS: 12 cm/s
Right CCA prox dias: 15 cm/s
Right CCA prox sys: 46 cm/s
Right cca dist sys: -51 cm/s

## 2017-08-27 NOTE — Progress Notes (Signed)
Chief Complaint: Follow up Extracranial Carotid Artery Stenosis   History of Present Illness  [name] is a 77 y.o. male returns for continued follow-up regarding his mild/moderate carotid artery occlusive disease and previous abdominal aortic aneurysm resection by Dr. Kellie Simmering. He denies any neurologic symptoms such as lateralizing weakness, aphasia, amaurosis fugax, diplopia, blurred vision, or syncope.  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 He is not on any anticoagulation medications other than aspirin daily. He is s/p CABG and aortic valve replacement in 2014 by Dr. Roxan Hockey.   He is riding recumbent bike 5 days/week.  His low back and hips hurt with walking. He has BPH, had a TURP and is able to urinate better.  Pt Diabetic: yes, states his last A1C was 5.8 Pt smoker: former smoker, quit in 2014, smoked x +50  Pt meds include: Statin : yes ASA: yes Other anticoagulants/antiplatelets: no    Past Medical History:  Diagnosis Date  . AAA (abdominal aortic aneurysm) ([place])    5/14 CT showed > 6 cm AAA, also right iliac aneurysm. Not stent graft candidate.   . Anemia of chronic disease   . Anxiety    pt. admits that he has anxiety at times   . Aortic stenosis    Moderate to severe by echo in 4/14. Bioprosthetic #21 [facility] Ease aortic valve replacement in 4/14.   . Arthritis   . CAD (coronary artery disease)    a. 4/14 CABG: LIMA-LAD, seq SVG-ramus & OM1, SVG-PDA  . Carotid arterial disease (HCC)    Carotid dopplers (3/64) with 68-03% LICA stenosis.   . CHF (congestive heart failure) ([place]) 07/2012; 10/17/2012  . Chronic kidney disease    increased creatinine recently - being followed, near kidney failure fr. contrast dye   . Chronic systolic heart failure (HCC)    a. 1/14 ECHO: sev dil LV, EF30-35%, diff HK, mild MR, AS severe, AV grad 35 b. 8/14 ECHO: EF 55-60%, mild biopros AV sten mn grad. 25, RV mild dil, RA mild dil  .  Complication of anesthesia    during last surgery had to be given special medicine b/c "something dropped" during surgery   . COPD (chronic obstructive pulmonary disease) ([place])    a.  History of heavy smoking. PFTs (4/14) with mixed obstructive (COPD) and restrictive (post-ARDS) picture.   . Diabetes mellitus without complication ([place])   . Esophageal reflux   . History of blood transfusion    "lots since January" (10/17/2012)  . Hyperlipidemia   . Hypertension   . Ischemic cardiomyopathy   . Ischemic cardiomyopathy    a. 4/14 LHC: pLAD 95, ost ramus 70-80, mLCx 90, EF 30%  . OSA (obstructive sleep apnea)    on cpap- every sleep time.   . Pneumonia 07/2012   PNA (H1N1 influenza + pneumococcus) in 2/12 complicated by respiratory failure/ARDS. Required tracheostomy, now weaned off. Had left empyema requiring chest tube.   . Prediabetes   . Prostate cancer [facility])     s/p radiation treatment. - (PT. DENIES)Has indwelling foley.   . Shortness of breath    still goes deer hunting by himself    Social History Social History   Tobacco Use  . Smoking status: Former Smoker    Packs/day: 2.00    Years: 58.00    Pack years: 116.00    Types: Cigarettes    Last attempt to quit: 07/20/2012    Years since quitting: 5.1  . Smokeless  tobacco: Never Used  Substance Use Topics  . Alcohol use: No    Alcohol/week: 0.0 oz    Comment: 10/17/2012 "years since I've had a drink; never had problem with it"  . Drug use: No    Family History Family History  Problem Relation Age of Onset  . Cancer Mother   . Heart disease Father   . Congestive Heart Failure Unknown   . Heart disease Unknown     Surgical History Past Surgical History:  Procedure Laterality Date  . ABDOMINAL AORTIC ANEURYSM REPAIR N/A 07/10/2013   Procedure: Resection and Graftiong of perirenal AAA; Insertion 14 x 8 Hemashield Graft Aorta to Left Common Iliac and to Right Common Femoral Artery With Ligastion of Right External and  Interanl Iliac Artery;  Surgeon: Mal Misty, MD;  Location: [place];  Service: Vascular;  Laterality: N/A;  . ANAL FISSURE REPAIR  2008  . AORTIC VALVE REPLACEMENT N/A 10/25/2012   Procedure: AORTIC VALVE REPLACEMENT (AVR);  Surgeon: Melrose Nakayama, MD;  Location: [place];  Service: Open Heart Surgery;  Laterality: N/A;  . CARDIAC VALVE REPLACEMENT    . CORONARY ARTERY BYPASS GRAFT N/A 10/25/2012   Procedure: CORONARY ARTERY BYPASS GRAFTING (CABG);  Surgeon: Melrose Nakayama, MD;  Location: [place];  Service: Open Heart Surgery;  Laterality: N/A;  times 4 using left internal mammary artery and endoscopically harvested bilateral saphenous vein   . EP IMPLANTABLE DEVICE N/A 09/22/2015   Procedure: BiV Upgrade;  Surgeon: Deboraha Sprang, MD;  Location: [place] [facility];  Service: Cardiovascular;  Laterality: N/A;  . INTRAOPERATIVE TRANSESOPHAGEAL ECHOCARDIOGRAM N/A 10/25/2012   Procedure: INTRAOPERATIVE TRANSESOPHAGEAL ECHOCARDIOGRAM;  Surgeon: Melrose Nakayama, MD;  Location: [place];  Service: Open Heart Surgery;  Laterality: N/A;  . LEFT AND RIGHT HEART CATHETERIZATION WITH CORONARY ANGIOGRAM N/A 10/22/2012   Procedure: LEFT AND RIGHT HEART CATHETERIZATION WITH CORONARY ANGIOGRAM;  Surgeon: Larey Dresser, MD;  Location: [facility] C[facility] LAB;  Service: Cardiovascular;  Laterality: N/A;  . NASAL FRACTURE SURGERY  1970's  . PACEMAKER REVISION  09/22/2015   pacemaker upgrade   . PERMANENT PACEMAKER INSERTION N/A 07/15/2014   Procedure: PERMANENT PACEMAKER INSERTION;  Surgeon: Deboraha Sprang, MD;  Location[facility] C[facility] LAB;  Service: Cardiovascular;  Laterality: N/A;  . TRACHEOSTOMY  07/2012  . TRACHEOSTOMY CLOSURE  08/2012  . TRANSURETHRAL MICROWAVE THERAPY  10/15/2012    Allergies  Allergen Reactions  . Omnipaque [Iohexol] Other (See Comments)    Decreased kidney function  . Primaxin [Imipenem] Hives  . Cephalosporins Rash    Blisters  . Lisinopril     Drops Blood pressure  nausea   .  Clindamycin/Lincomycin Swelling and Rash    Current Outpatient Medications  Medication Sig Dispense Refill  . allopurinol (ZYLOPRIM) 300 MG tablet Take 300 mg by mouth daily.    Marland Kitchen aspirin EC 81 MG tablet Take 81 mg by mouth daily.    Marland Kitchen atorvastatin (LIPITOR) 80 MG tablet TAKE 1 TABLET BY MOUTH EVERY DAY BEFORE BREAKFAST 30 tablet 3  . bisoprolol (ZEBETA) 5 MG tablet Take 0.5 tablets (2.5 mg total) by mouth daily. 45 tablet 3  . calcitRIOL (ROCALTROL) 0.25 MCG capsule Take 0.25 mcg by mouth daily.     . fexofenadine (ALLEGRA) 180 MG tablet Take 90 mg by mouth daily.     Marland Kitchen ipratropium-albuterol (DUONEB) 0.5-2.5 (3) MG/3ML SOLN TAKE 3 MLS BY NEBULIZATION EVERY 6 (SIX) HOURS AS NEEDED. DX: J44.9 120 mL 0  .  KLOR-CON M10 10 MEQ tablet TAKE 2 TABLETS (20 MEQ TOTAL) BY MOUTH 2 (TWO) TIMES DAILY. 360 tablet 1  . nitroGLYCERIN (NITROSTAT) 0.4 MG SL tablet PLACE 1 TABLET UNDER TONGUE EVERY 5 MINUTES AS NEEDED FOR CHEST PAIN 25 tablet 0  . pantoprazole (PROTONIX) 40 MG tablet Take 40 mg by mouth at bedtime.     . torsemide (DEMADEX) 20 MG tablet Take 2 tablets (40 mg total) daily by mouth. 60 tablet 3  . TRESIBA FLEXTOUCH 200 UNIT/ML SOPN Inject 56 Units into the skin daily.    Marland Kitchen allopurinol (ZYLOPRIM) 100 MG tablet Take 100 mg by mouth at bedtime. Also take a 300 mg tablet during the day    . atorvastatin (LIPITOR) 80 MG tablet TAKE 1 TABLET BY MOUTH EVERY DAY BEFORE BREAKFAST (Patient not taking: Reported on 08/27/2017) 90 tablet 3  . JANUVIA 100 MG tablet Take 50 mg by mouth daily.   6  . metolazone (ZAROXOLYN) 2.5 MG tablet Take 2.5 mg by mouth as needed. Only as directed    . torsemide (DEMADEX) 20 MG tablet Take 2 tablets (40 mg total) by mouth daily. (Patient not taking: Reported on 08/27/2017) 90 tablet 3   No current facility-administered medications for this visit.     Review of Systems : See HPI for pertinent positives and negatives.  Physical Examination  Vitals:   08/27/17 1144  08/27/17 1147  BP: 95/70 109/70  Pulse: 76   Resp: 16   Temp: (!) 97.3 F (36.3 C)   TempSrc: Oral   SpO2: 94%   Weight: 244 lb 3.2 oz (110.8 kg)    Body mass index is 40.02 kg/m.  General: WDWN morbidly obese male in NAD GAIT:normal HENT: No gross abnormalities  Eyes: PERRLA Pulmonary: Respirations are non labored at rest, fair air movement in all fields, no rales, rhonchi, or wheezes. Cardiac: regular rhythm, no detected murmur. Pacemaker palpated left upper chest.   VASCULAR EXAM Carotid Bruits Right Left   Negative Negative   Abdominal aortic pulse is not palpable. Radial pulses are 1+ palpable and equal.   LE Pulses Right Left  FEMORAL faintly palpable 1+ palpable   POPLITEAL not palpable  2+ palpable  POSTERIOR TIBIAL 1+ palpable  1+ palpable   DORSALIS PEDIS ANTERIOR TIBIAL not palpable  not palpable     Gastrointestinal: soft, nontender, BS WNL, no r/g, no palpable masses. Musculoskeletal: no muscle atrophy/wasting. M/S 5/5 throughout, extremities without ischemic changes. He is wearing knee high compression hose. 1+ bilateral pretibial pitting.  Skin: No rashes, no ulcers, no cellulitis.   Neurologic:  A&O X 3; appropriate affect, sensation is normal; speech is normal, CN 2-12 intact except is slightly hard of hearing, is wearing bilateral hearing aids, pain and light touch intact in extremities, motor exam as listed above. Psychiatric: Normal thought content, mood appropriate to clinical situation.       Assessment: PERCIVAL GLASHEEN is a 77 y.o. male who has no history of stroke or TIA. He is s/p abdominal aortic aneurysm resection on 07-10-13.   His atherosclerotic risk factors include currently in control DM, former smoker, CAD, stage 3 CKD, and morbid obesity.  He states he has gained 40 pounds since taking insulin, but his A1C has improved to 5.8.   DATA Carotid Duplex  (08/27/17): 1-39% right ICA stenosis 40-59% left ICA stenosis. Bilateral vertebral artery flow is antegrade.  Bilateral subclavian artery waveforms are normal.  No change since exams of 08/11/2014, 08-17-15, and 08-22-16.  ABI's in February 2016 were normal with triphasic waveforms.   Plan: Follow-up in 1 year with Carotid Duplex scan.  I discussed in depth with the patient the nature of atherosclerosis, and emphasized the importance of maximal medical management including strict control of blood pressure, blood glucose, and lipid levels, obtaining regular exercise, and continued cessation of smoking.  The patient is aware that without maximal medical management the underlying atherosclerotic disease process will progress, limiting the benefit of any interventions. The patient was given information about stroke prevention and what symptoms should prompt the patient to seek immediate medical care. Thank you for allowing [place] to participate in this patient's care.  Vincent Chambers, RN, MSN, FNP-C [facility] Office: [phone]  Clinic Physician: Trula Slade  08/27/17 12:29 PM

## 2017-08-27 NOTE — Patient Instructions (Signed)

## 2017-09-02 ENCOUNTER — Other Ambulatory Visit (HOSPITAL_COMMUNITY): Payer: Self-pay | Admitting: Cardiology

## 2017-09-11 DIAGNOSIS — H0259 Other disorders affecting eyelid function: Secondary | ICD-10-CM | POA: Diagnosis not present

## 2017-09-11 DIAGNOSIS — H02103 Unspecified ectropion of right eye, unspecified eyelid: Secondary | ICD-10-CM | POA: Diagnosis not present

## 2017-09-18 ENCOUNTER — Other Ambulatory Visit: Payer: Self-pay | Admitting: Adult Health

## 2017-10-17 ENCOUNTER — Ambulatory Visit (INDEPENDENT_AMBULATORY_CARE_PROVIDER_SITE_OTHER): Payer: Medicare Other | Admitting: *Deleted

## 2017-10-17 ENCOUNTER — Encounter: Payer: Self-pay | Admitting: Internal Medicine

## 2017-10-17 DIAGNOSIS — I5022 Chronic systolic (congestive) heart failure: Secondary | ICD-10-CM

## 2017-10-17 DIAGNOSIS — I442 Atrioventricular block, complete: Secondary | ICD-10-CM

## 2017-10-17 NOTE — Progress Notes (Signed)
Remote pacemaker transmission.   

## 2017-10-18 ENCOUNTER — Encounter: Payer: Self-pay | Admitting: Cardiology

## 2017-10-23 LAB — CUP PACEART REMOTE DEVICE CHECK
Battery Remaining Percentage: 95.5 %
Brady Statistic AP VP Percent: 1 %
Brady Statistic AP VS Percent: 1 %
Brady Statistic AS VP Percent: 99 %
Brady Statistic AS VS Percent: 1 %
Brady Statistic RA Percent Paced: 1 %
Date Time Interrogation Session: 20190417121635
Implantable Lead Implant Date: 20160113
Implantable Lead Location: 753859
Lead Channel Impedance Value: 490 Ohm
Lead Channel Pacing Threshold Amplitude: 0.5 V
Lead Channel Pacing Threshold Amplitude: 0.75 V
Lead Channel Pacing Threshold Pulse Width: 0.4 ms
Lead Channel Pacing Threshold Pulse Width: 0.4 ms
Lead Channel Sensing Intrinsic Amplitude: 12 mV
Lead Channel Sensing Intrinsic Amplitude: 5 mV
Lead Channel Setting Pacing Amplitude: 1.5 V
Lead Channel Setting Pacing Amplitude: 2.5 V
Lead Channel Setting Pacing Pulse Width: 0.4 ms
Lead Channel Setting Sensing Sensitivity: 5 mV
MDC IDC LEAD IMPLANT DT: 20160113
MDC IDC LEAD IMPLANT DT: 20170322
MDC IDC LEAD LOCATION: 753858
MDC IDC LEAD LOCATION: 753860
MDC IDC MSMT BATTERY REMAINING LONGEVITY: 94 mo
MDC IDC MSMT BATTERY VOLTAGE: 2.96 V
MDC IDC MSMT LEADCHNL LV IMPEDANCE VALUE: 680 Ohm
MDC IDC MSMT LEADCHNL RA IMPEDANCE VALUE: 400 Ohm
MDC IDC MSMT LEADCHNL RA PACING THRESHOLD AMPLITUDE: 0.5 V
MDC IDC MSMT LEADCHNL RA PACING THRESHOLD PULSEWIDTH: 0.4 ms
MDC IDC PG IMPLANT DT: 20170322
MDC IDC SET LEADCHNL LV PACING AMPLITUDE: 2 V
MDC IDC SET LEADCHNL LV PACING PULSEWIDTH: 0.4 ms
Pulse Gen Model: 3262
Pulse Gen Serial Number: 7838612

## 2017-10-24 ENCOUNTER — Other Ambulatory Visit (HOSPITAL_COMMUNITY): Payer: Self-pay | Admitting: Cardiology

## 2017-10-28 ENCOUNTER — Other Ambulatory Visit (HOSPITAL_COMMUNITY): Payer: Self-pay | Admitting: Cardiology

## 2017-10-29 ENCOUNTER — Ambulatory Visit (INDEPENDENT_AMBULATORY_CARE_PROVIDER_SITE_OTHER): Payer: Medicare Other | Admitting: Internal Medicine

## 2017-10-29 ENCOUNTER — Encounter: Payer: Self-pay | Admitting: Internal Medicine

## 2017-10-29 ENCOUNTER — Other Ambulatory Visit: Payer: Self-pay | Admitting: Adult Health

## 2017-10-29 VITALS — BP 130/70 | HR 75 | Ht 65.0 in | Wt 240.0 lb

## 2017-10-29 DIAGNOSIS — Z950 Presence of cardiac pacemaker: Secondary | ICD-10-CM

## 2017-10-29 DIAGNOSIS — I5022 Chronic systolic (congestive) heart failure: Secondary | ICD-10-CM

## 2017-10-29 DIAGNOSIS — I442 Atrioventricular block, complete: Secondary | ICD-10-CM | POA: Diagnosis not present

## 2017-10-29 DIAGNOSIS — I429 Cardiomyopathy, unspecified: Secondary | ICD-10-CM | POA: Diagnosis not present

## 2017-10-29 DIAGNOSIS — I6523 Occlusion and stenosis of bilateral carotid arteries: Secondary | ICD-10-CM

## 2017-10-29 DIAGNOSIS — I5042 Chronic combined systolic (congestive) and diastolic (congestive) heart failure: Secondary | ICD-10-CM

## 2017-10-29 LAB — CUP PACEART INCLINIC DEVICE CHECK
Battery Remaining Longevity: 90 mo
Brady Statistic RA Percent Paced: 0.09 %
Brady Statistic RV Percent Paced: 99.45 %
Date Time Interrogation Session: 20190429165448
Implantable Lead Implant Date: 20160113
Implantable Lead Implant Date: 20160113
Implantable Lead Location: 753858
Implantable Lead Location: 753859
Implantable Pulse Generator Implant Date: 20170322
Lead Channel Impedance Value: 425 Ohm
Lead Channel Impedance Value: 512.5 Ohm
Lead Channel Impedance Value: 637.5 Ohm
Lead Channel Pacing Threshold Amplitude: 0.5 V
Lead Channel Pacing Threshold Pulse Width: 0.4 ms
Lead Channel Pacing Threshold Pulse Width: 0.4 ms
Lead Channel Sensing Intrinsic Amplitude: 3.1 mV
Lead Channel Setting Pacing Amplitude: 1.5 V
Lead Channel Setting Pacing Amplitude: 2.5 V
Lead Channel Setting Pacing Pulse Width: 0.4 ms
MDC IDC LEAD IMPLANT DT: 20170322
MDC IDC LEAD LOCATION: 753860
MDC IDC MSMT BATTERY VOLTAGE: 2.96 V
MDC IDC MSMT LEADCHNL LV PACING THRESHOLD AMPLITUDE: 0.75 V
MDC IDC MSMT LEADCHNL RA PACING THRESHOLD AMPLITUDE: 0.5 V
MDC IDC MSMT LEADCHNL RA PACING THRESHOLD PULSEWIDTH: 0.4 ms
MDC IDC SET LEADCHNL LV PACING AMPLITUDE: 2 V
MDC IDC SET LEADCHNL RV PACING PULSEWIDTH: 0.4 ms
MDC IDC SET LEADCHNL RV SENSING SENSITIVITY: 5 mV
Pulse Gen Model: 3262
Pulse Gen Serial Number: 7838612

## 2017-10-29 NOTE — Progress Notes (Signed)
Patient Care Team: [name], MD as PCP - General (Internal Medicine) Larey Dresser, MD as Attending Physician (Cardiology)   HPI  [name] is a 77 y.o. 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 He underwent device implantation January 2016 and CRT upgrade 3/17  He remains much imprjoved following CRT  He exercises 45 min/day but has been unable to lose weight.  He has been discussing with PCP a new diabetic meds to minimize fluid retention  DATE TEST    10/16 Echo  EF 30   2/17    echo   EF 20-25 %   8/17    echo   EF 50 %         Mild edema, no chest pain but chronic dyspnea     .    Past Medical History:  Diagnosis Date  . AAA (abdominal aortic aneurysm) ([place])    5/14 CT showed > 6 cm AAA, also right iliac aneurysm. Not stent graft candidate.   . Anemia of chronic disease   . Anxiety    pt. admits that he has anxiety at times   . Aortic stenosis    Moderate to severe by echo in 4/14. Bioprosthetic #21 [facility] Ease aortic valve replacement in 4/14.   . Arthritis   . CAD (coronary artery disease)    a. 4/14 CABG: LIMA-LAD, seq SVG-ramus & OM1, SVG-PDA  . Carotid arterial disease (HCC)    Carotid dopplers (4/70) with 96-28% LICA stenosis.   . CHF (congestive heart failure) ([place]) 07/2012; 10/17/2012  . Chronic kidney disease    increased creatinine recently - being followed, near kidney failure fr. contrast dye   . Chronic systolic heart failure (HCC)    a. 1/14 ECHO: sev dil LV, EF30-35%, diff HK, mild MR, AS severe, AV grad 35 b. 8/14 ECHO: EF 55-60%, mild biopros AV sten mn grad. 25, RV mild dil, RA mild dil  . Complication of anesthesia    during last surgery had to be given special medicine b/c "something dropped" during surgery   . COPD (chronic obstructive pulmonary disease) ([place])    a.  History of heavy smoking. PFTs (4/14) with mixed obstructive (COPD)  and restrictive (post-ARDS) picture.   . Diabetes mellitus without complication ([place])   . Esophageal reflux   . History of blood transfusion    "lots since January" (10/17/2012)  . Hyperlipidemia   . Hypertension   . Ischemic cardiomyopathy   . Ischemic cardiomyopathy    a. 4/14 LHC: pLAD 95, ost ramus 70-80, mLCx 90, EF 30%  . OSA (obstructive sleep apnea)    on cpap- every sleep time.   . Pneumonia 07/2012   PNA (H1N1 influenza + pneumococcus) in 3/66 complicated by respiratory failure/ARDS. Required tracheostomy, now weaned off. Had left empyema requiring chest tube.   . Prediabetes   . Prostate cancer [facility])     s/p radiation treatment. - (PT. DENIES)Has indwelling foley.   . Shortness of breath    still goes deer hunting by himself    Past Surgical History:  Procedure Laterality Date  . ABDOMINAL AORTIC ANEURYSM REPAIR N/A 07/10/2013   Procedure: Resection and Graftiong of perirenal AAA; Insertion 14 x 8 Hemashield Graft Aorta to Left Common Iliac and to Right Common Femoral Artery With Ligastion of Right External and Interanl Iliac Artery;  Surgeon: Mal Misty, MD;  Location: [facility]  OR;  Service: Vascular;  Laterality: N/A;  . ANAL FISSURE REPAIR  2008  . AORTIC VALVE REPLACEMENT N/A 10/25/2012   Procedure: AORTIC VALVE REPLACEMENT (AVR);  Surgeon: Melrose Nakayama, MD;  Location: [place];  Service: Open Heart Surgery;  Laterality: N/A;  . CARDIAC VALVE REPLACEMENT    . CORONARY ARTERY BYPASS GRAFT N/A 10/25/2012   Procedure: CORONARY ARTERY BYPASS GRAFTING (CABG);  Surgeon: Melrose Nakayama, MD;  Location: [place];  Service: Open Heart Surgery;  Laterality: N/A;  times 4 using left internal mammary artery and endoscopically harvested bilateral saphenous vein   . EP IMPLANTABLE DEVICE N/A 09/22/2015   Procedure: BiV Upgrade;  Surgeon: Deboraha Sprang, MD;  Location: [place] CV LAB;  Service: Cardiovascular;  Laterality: N/A;  . INTRAOPERATIVE TRANSESOPHAGEAL ECHOCARDIOGRAM N/A  10/25/2012   Procedure: INTRAOPERATIVE TRANSESOPHAGEAL ECHOCARDIOGRAM;  Surgeon: Melrose Nakayama, MD;  Location: [place];  Service: Open Heart Surgery;  Laterality: N/A;  . LEFT AND RIGHT HEART CATHETERIZATION WITH CORONARY ANGIOGRAM N/A 10/22/2012   Procedure: LEFT AND RIGHT HEART CATHETERIZATION WITH CORONARY ANGIOGRAM;  Surgeon: Larey Dresser, MD;  Location: [facility] CATH LAB;  Service: Cardiovascular;  Laterality: N/A;  . NASAL FRACTURE SURGERY  1970's  . PACEMAKER REVISION  09/22/2015   pacemaker upgrade   . PERMANENT PACEMAKER INSERTION N/A 07/15/2014   Procedure: PERMANENT PACEMAKER INSERTION;  Surgeon: Deboraha Sprang, MD;  Location: [facility] CATH LAB;  Service: Cardiovascular;  Laterality: N/A;  . TRACHEOSTOMY  07/2012  . TRACHEOSTOMY CLOSURE  08/2012  . TRANSURETHRAL MICROWAVE THERAPY  10/15/2012    Current Outpatient Medications  Medication Sig Dispense Refill  . allopurinol (ZYLOPRIM) 100 MG tablet Take 100 mg by mouth at bedtime. Also take a 300 mg tablet during the day    . allopurinol (ZYLOPRIM) 300 MG tablet Take 300 mg by mouth daily.    Marland Kitchen aspirin EC 81 MG tablet Take 81 mg by mouth daily.    Marland Kitchen atorvastatin (LIPITOR) 80 MG tablet TAKE 1 TABLET BY MOUTH EVERY DAY BEFORE BREAKFAST 30 tablet 3  . atorvastatin (LIPITOR) 80 MG tablet TAKE 1 TABLET BY MOUTH EVERY DAY BEFORE BREAKFAST 90 tablet 3  . bisoprolol (ZEBETA) 5 MG tablet Take 0.5 tablets (2.5 mg total) by mouth daily. 45 tablet 3  . calcitRIOL (ROCALTROL) 0.25 MCG capsule Take 0.25 mcg by mouth daily.     . fexofenadine (ALLEGRA) 180 MG tablet Take 90 mg by mouth daily.     Marland Kitchen ipratropium-albuterol (DUONEB) 0.5-2.5 (3) MG/3ML SOLN TAKE 3 MLS BY NEBULIZATION EVERY 6 (SIX) HOURS AS NEEDED. DX: J44.9 360 mL 0  . KLOR-CON M10 10 MEQ tablet TAKE 2 TABLETS (20 MEQ TOTAL) BY MOUTH 2 (TWO) TIMES DAILY. 360 tablet 1  . nitroGLYCERIN (NITROSTAT) 0.4 MG SL tablet PLACE 1 TABLET UNDER TONGUE EVERY 5 MINUTES AS NEEDED FOR CHEST PAIN 25 tablet 0  .  pantoprazole (PROTONIX) 40 MG tablet Take 40 mg by mouth at bedtime.     . torsemide (DEMADEX) 20 MG tablet TAKE 2 TABLETS (40 MG TOTAL) DAILY BY MOUTH. 60 tablet 1   No current facility-administered medications for this visit.     Allergies  Allergen Reactions  . Omnipaque [Iohexol] Other (See Comments)    Decreased kidney function  . Primaxin [Imipenem] Hives  . Cephalosporins Rash    Blisters  . Lisinopril     Drops Blood pressure  nausea   . Clindamycin/Lincomycin Swelling and Rash    Review of Systems  negative except from HPI and PMH  Physical Exam BP 130/70   Pulse 75   Ht 5\' 5"  (1.651 m)   Wt 240 lb (108.9 kg)   SpO2 98%   BMI 39.94 kg/m  Well developed and Morbidly obese  in no acute distress malodorous HENT normal Neck supple with JVP-flat Clear Regular rate and rhythm, no murmurs or gallops Abd-soft with active BS No Clubbing cyanosis 1+ edema Skin-warm and dry A & Oriented  Grossly normal sensory and motor function     Assessment and  Plan  High-grade heart block now device dependent  Obesity  Obstructive sleep apnea  Cardiomyopathy    Congestive heart failure-chronic-mixed  Sinus tachycardia  CRT Pacemaker-Medtronic The patient's device was interrogated.  The information was reviewed. No changes were made in the programming.      Ischemic heart disease with prior bypass surgery  HR much better controlled    Without symptoms of ischemia  Mild volume overload-- encouraged salt restriction and exercise and discussions re diabetec meds     We spent more than 50% of our >25 min visit in face to face counseling regarding the above

## 2017-10-29 NOTE — Patient Instructions (Signed)
Medication Instructions:  Your physician recommends that you continue on your current medications as directed. Please refer to the Current Medication list given to you today.  Labwork: None ordered.  Testing/Procedures: None ordered.  Follow-Up: Your physician wants you to follow-up in: One Year with Dr Caryl Comes. You will receive a reminder letter in the mail two months in advance. If you don't receive a letter, please call our office to schedule the follow-up appointment.  Remote monitoring is used to monitor your Pacemaker of ICD from home. This monitoring reduces the number of office visits required to check your device to one time per year. It allows [place] to keep an eye on the functioning of your device to ensure it is working properly. You are scheduled for a device check from home on 01/16/2018. You may send your transmission at any time that day. If you have a wireless device, the transmission will be sent automatically. After your physician reviews your transmission, you will receive a postcard with your next transmission date.  Any Other Special Instructions Will Be Listed Below (If Applicable).     If you need a refill on your cardiac medications before your next appointment, please call your pharmacy.

## 2017-11-01 DIAGNOSIS — Z9889 Other specified postprocedural states: Secondary | ICD-10-CM | POA: Diagnosis not present

## 2017-11-01 DIAGNOSIS — H9213 Otorrhea, bilateral: Secondary | ICD-10-CM | POA: Diagnosis not present

## 2017-11-01 DIAGNOSIS — H6690 Otitis media, unspecified, unspecified ear: Secondary | ICD-10-CM | POA: Diagnosis not present

## 2017-11-07 DIAGNOSIS — I119 Hypertensive heart disease without heart failure: Secondary | ICD-10-CM | POA: Diagnosis not present

## 2017-11-07 DIAGNOSIS — E559 Vitamin D deficiency, unspecified: Secondary | ICD-10-CM | POA: Diagnosis not present

## 2017-11-07 DIAGNOSIS — I2510 Atherosclerotic heart disease of native coronary artery without angina pectoris: Secondary | ICD-10-CM | POA: Diagnosis not present

## 2017-11-07 DIAGNOSIS — E119 Type 2 diabetes mellitus without complications: Secondary | ICD-10-CM | POA: Diagnosis not present

## 2017-11-15 DIAGNOSIS — E119 Type 2 diabetes mellitus without complications: Secondary | ICD-10-CM | POA: Diagnosis not present

## 2017-11-15 DIAGNOSIS — H524 Presbyopia: Secondary | ICD-10-CM | POA: Diagnosis not present

## 2017-11-15 DIAGNOSIS — H25813 Combined forms of age-related cataract, bilateral: Secondary | ICD-10-CM | POA: Diagnosis not present

## 2017-11-19 DIAGNOSIS — H6690 Otitis media, unspecified, unspecified ear: Secondary | ICD-10-CM | POA: Diagnosis not present

## 2017-11-19 DIAGNOSIS — H9213 Otorrhea, bilateral: Secondary | ICD-10-CM | POA: Diagnosis not present

## 2017-11-19 DIAGNOSIS — H6980 Other specified disorders of Eustachian tube, unspecified ear: Secondary | ICD-10-CM | POA: Diagnosis not present

## 2017-11-29 ENCOUNTER — Other Ambulatory Visit: Payer: Self-pay | Admitting: Adult Health

## 2017-11-30 DIAGNOSIS — E782 Mixed hyperlipidemia: Secondary | ICD-10-CM | POA: Diagnosis not present

## 2017-11-30 DIAGNOSIS — N2581 Secondary hyperparathyroidism of renal origin: Secondary | ICD-10-CM | POA: Diagnosis not present

## 2017-11-30 DIAGNOSIS — M109 Gout, unspecified: Secondary | ICD-10-CM | POA: Diagnosis not present

## 2017-11-30 DIAGNOSIS — I2510 Atherosclerotic heart disease of native coronary artery without angina pectoris: Secondary | ICD-10-CM | POA: Diagnosis not present

## 2017-11-30 DIAGNOSIS — N183 Chronic kidney disease, stage 3 (moderate): Secondary | ICD-10-CM | POA: Diagnosis not present

## 2017-11-30 DIAGNOSIS — D631 Anemia in chronic kidney disease: Secondary | ICD-10-CM | POA: Diagnosis not present

## 2017-11-30 DIAGNOSIS — I255 Ischemic cardiomyopathy: Secondary | ICD-10-CM | POA: Diagnosis not present

## 2017-11-30 DIAGNOSIS — C61 Malignant neoplasm of prostate: Secondary | ICD-10-CM | POA: Diagnosis not present

## 2017-11-30 DIAGNOSIS — Z950 Presence of cardiac pacemaker: Secondary | ICD-10-CM | POA: Diagnosis not present

## 2017-11-30 DIAGNOSIS — E1122 Type 2 diabetes mellitus with diabetic chronic kidney disease: Secondary | ICD-10-CM | POA: Diagnosis not present

## 2017-11-30 DIAGNOSIS — I129 Hypertensive chronic kidney disease with stage 1 through stage 4 chronic kidney disease, or unspecified chronic kidney disease: Secondary | ICD-10-CM | POA: Diagnosis not present

## 2017-11-30 DIAGNOSIS — I350 Nonrheumatic aortic (valve) stenosis: Secondary | ICD-10-CM | POA: Diagnosis not present

## 2017-12-04 DIAGNOSIS — Z8669 Personal history of other diseases of the nervous system and sense organs: Secondary | ICD-10-CM | POA: Diagnosis not present

## 2017-12-04 DIAGNOSIS — H7292 Unspecified perforation of tympanic membrane, left ear: Secondary | ICD-10-CM | POA: Diagnosis not present

## 2017-12-04 DIAGNOSIS — H9213 Otorrhea, bilateral: Secondary | ICD-10-CM | POA: Diagnosis not present

## 2018-01-03 ENCOUNTER — Other Ambulatory Visit: Payer: Self-pay | Admitting: Cardiology

## 2018-01-16 ENCOUNTER — Ambulatory Visit (INDEPENDENT_AMBULATORY_CARE_PROVIDER_SITE_OTHER): Payer: Medicare Other | Admitting: *Deleted

## 2018-01-16 DIAGNOSIS — I5022 Chronic systolic (congestive) heart failure: Secondary | ICD-10-CM

## 2018-01-16 DIAGNOSIS — I442 Atrioventricular block, complete: Secondary | ICD-10-CM | POA: Diagnosis not present

## 2018-01-16 NOTE — Progress Notes (Signed)
Remote pacemaker transmission.   

## 2018-01-17 ENCOUNTER — Encounter: Payer: Self-pay | Admitting: Cardiology

## 2018-01-25 ENCOUNTER — Telehealth (HOSPITAL_COMMUNITY): Payer: Self-pay | Admitting: Cardiology

## 2018-01-25 MED ORDER — TORSEMIDE 20 MG PO TABS: 60.0000 mg | ORAL_TABLET | Freq: Every day | ORAL | 1 refills | Status: DC

## 2018-01-25 NOTE — Telephone Encounter (Signed)
Patient seen by Dr Detterding last week and torsemide was increased to 60 mg daily however quantity at pharmacy does not match new dose.    New rx and follow up made for patient as he is over due for appt in the CHF clinic

## 2018-01-27 LAB — CUP PACEART REMOTE DEVICE CHECK
Brady Statistic AP VP Percent: 1.6 %
Brady Statistic AS VP Percent: 96 %
Brady Statistic AS VS Percent: 1 %
Date Time Interrogation Session: 20190717144439
Implantable Lead Implant Date: 20160113
Implantable Lead Implant Date: 20160113
Implantable Lead Location: 753858
Lead Channel Impedance Value: 460 Ohm
Lead Channel Pacing Threshold Amplitude: 0.5 V
Lead Channel Pacing Threshold Amplitude: 0.75 V
Lead Channel Pacing Threshold Pulse Width: 0.4 ms
Lead Channel Pacing Threshold Pulse Width: 0.4 ms
Lead Channel Pacing Threshold Pulse Width: 0.4 ms
Lead Channel Sensing Intrinsic Amplitude: 12 mV
Lead Channel Setting Pacing Amplitude: 2 V
Lead Channel Setting Pacing Amplitude: 2.5 V
Lead Channel Setting Sensing Sensitivity: 5 mV
MDC IDC LEAD IMPLANT DT: 20170322
MDC IDC LEAD LOCATION: 753859
MDC IDC LEAD LOCATION: 753860
MDC IDC MSMT BATTERY REMAINING LONGEVITY: 95 mo
MDC IDC MSMT BATTERY REMAINING PERCENTAGE: 95.5 %
MDC IDC MSMT BATTERY VOLTAGE: 2.96 V
MDC IDC MSMT LEADCHNL LV IMPEDANCE VALUE: 630 Ohm
MDC IDC MSMT LEADCHNL RA IMPEDANCE VALUE: 400 Ohm
MDC IDC MSMT LEADCHNL RA PACING THRESHOLD AMPLITUDE: 0.375 V
MDC IDC MSMT LEADCHNL RA SENSING INTR AMPL: 4.4 mV
MDC IDC PG IMPLANT DT: 20170322
MDC IDC SET LEADCHNL LV PACING PULSEWIDTH: 0.4 ms
MDC IDC SET LEADCHNL RA PACING AMPLITUDE: 1.375
MDC IDC SET LEADCHNL RV PACING PULSEWIDTH: 0.4 ms
MDC IDC STAT BRADY AP VS PERCENT: 1 %
MDC IDC STAT BRADY RA PERCENT PACED: 1 %
Pulse Gen Model: 3262
Pulse Gen Serial Number: 7838612

## 2018-01-30 ENCOUNTER — Other Ambulatory Visit: Payer: Self-pay | Admitting: Cardiology

## 2018-02-05 DIAGNOSIS — N318 Other neuromuscular dysfunction of bladder: Secondary | ICD-10-CM | POA: Diagnosis not present

## 2018-02-05 DIAGNOSIS — R972 Elevated prostate specific antigen [PSA]: Secondary | ICD-10-CM | POA: Diagnosis not present

## 2018-02-05 DIAGNOSIS — N3020 Other chronic cystitis without hematuria: Secondary | ICD-10-CM | POA: Diagnosis not present

## 2018-02-05 DIAGNOSIS — N401 Enlarged prostate with lower urinary tract symptoms: Secondary | ICD-10-CM | POA: Diagnosis not present

## 2018-02-06 DIAGNOSIS — E559 Vitamin D deficiency, unspecified: Secondary | ICD-10-CM | POA: Diagnosis not present

## 2018-02-06 DIAGNOSIS — I2510 Atherosclerotic heart disease of native coronary artery without angina pectoris: Secondary | ICD-10-CM | POA: Diagnosis not present

## 2018-02-06 DIAGNOSIS — Z950 Presence of cardiac pacemaker: Secondary | ICD-10-CM | POA: Diagnosis not present

## 2018-02-06 DIAGNOSIS — N183 Chronic kidney disease, stage 3 (moderate): Secondary | ICD-10-CM | POA: Diagnosis not present

## 2018-02-06 DIAGNOSIS — I119 Hypertensive heart disease without heart failure: Secondary | ICD-10-CM | POA: Diagnosis not present

## 2018-02-06 DIAGNOSIS — E119 Type 2 diabetes mellitus without complications: Secondary | ICD-10-CM | POA: Diagnosis not present

## 2018-02-06 DIAGNOSIS — M109 Gout, unspecified: Secondary | ICD-10-CM | POA: Diagnosis not present

## 2018-02-13 ENCOUNTER — Other Ambulatory Visit (HOSPITAL_COMMUNITY): Payer: Self-pay | Admitting: Cardiology

## 2018-02-16 ENCOUNTER — Other Ambulatory Visit (HOSPITAL_COMMUNITY): Payer: Self-pay | Admitting: Cardiology

## 2018-03-06 DIAGNOSIS — Z23 Encounter for immunization: Secondary | ICD-10-CM | POA: Diagnosis not present

## 2018-03-06 DIAGNOSIS — E119 Type 2 diabetes mellitus without complications: Secondary | ICD-10-CM | POA: Diagnosis not present

## 2018-03-09 ENCOUNTER — Other Ambulatory Visit (HOSPITAL_COMMUNITY): Payer: Self-pay | Admitting: Cardiology

## 2018-03-15 ENCOUNTER — Other Ambulatory Visit (HOSPITAL_COMMUNITY): Payer: Self-pay | Admitting: Cardiology

## 2018-03-15 DIAGNOSIS — H0259 Other disorders affecting eyelid function: Secondary | ICD-10-CM | POA: Diagnosis not present

## 2018-03-15 DIAGNOSIS — H02103 Unspecified ectropion of right eye, unspecified eyelid: Secondary | ICD-10-CM | POA: Diagnosis not present

## 2018-04-02 ENCOUNTER — Encounter (HOSPITAL_COMMUNITY): Payer: Self-pay | Admitting: Cardiology

## 2018-04-02 ENCOUNTER — Other Ambulatory Visit: Payer: Self-pay

## 2018-04-02 ENCOUNTER — Ambulatory Visit (HOSPITAL_COMMUNITY)
Admission: RE | Admit: 2018-04-02 | Discharge: 2018-04-02 | Disposition: A | Discharge status: Home / Self Care | Payer: Medicare Other | Source: Ambulatory Visit | Attending: Cardiology | Admitting: Cardiology

## 2018-04-02 VITALS — BP 124/85 | HR 73 | Wt 238.2 lb

## 2018-04-02 DIAGNOSIS — Z87891 Personal history of nicotine dependence: Secondary | ICD-10-CM | POA: Diagnosis not present

## 2018-04-02 DIAGNOSIS — Z953 Presence of xenogenic heart valve: Secondary | ICD-10-CM | POA: Insufficient documentation

## 2018-04-02 DIAGNOSIS — E119 Type 2 diabetes mellitus without complications: Secondary | ICD-10-CM

## 2018-04-02 DIAGNOSIS — N179 Acute kidney failure, unspecified: Secondary | ICD-10-CM | POA: Diagnosis not present

## 2018-04-02 DIAGNOSIS — Z950 Presence of cardiac pacemaker: Secondary | ICD-10-CM | POA: Insufficient documentation

## 2018-04-02 DIAGNOSIS — Z7982 Long term (current) use of aspirin: Secondary | ICD-10-CM | POA: Insufficient documentation

## 2018-04-02 DIAGNOSIS — Z951 Presence of aortocoronary bypass graft: Secondary | ICD-10-CM | POA: Insufficient documentation

## 2018-04-02 DIAGNOSIS — N183 Chronic kidney disease, stage 3 (moderate): Secondary | ICD-10-CM | POA: Insufficient documentation

## 2018-04-02 DIAGNOSIS — E1122 Type 2 diabetes mellitus with diabetic chronic kidney disease: Secondary | ICD-10-CM | POA: Diagnosis not present

## 2018-04-02 DIAGNOSIS — E785 Hyperlipidemia, unspecified: Secondary | ICD-10-CM | POA: Insufficient documentation

## 2018-04-02 DIAGNOSIS — J449 Chronic obstructive pulmonary disease, unspecified: Secondary | ICD-10-CM | POA: Diagnosis not present

## 2018-04-02 DIAGNOSIS — Z930 Tracheostomy status: Secondary | ICD-10-CM | POA: Insufficient documentation

## 2018-04-02 DIAGNOSIS — T50995A Adverse effect of other drugs, medicaments and biological substances, initial encounter: Secondary | ICD-10-CM | POA: Insufficient documentation

## 2018-04-02 DIAGNOSIS — I130 Hypertensive heart and chronic kidney disease with heart failure and stage 1 through stage 4 chronic kidney disease, or unspecified chronic kidney disease: Secondary | ICD-10-CM | POA: Insufficient documentation

## 2018-04-02 DIAGNOSIS — Z8546 Personal history of malignant neoplasm of prostate: Secondary | ICD-10-CM | POA: Diagnosis not present

## 2018-04-02 DIAGNOSIS — Z79899 Other long term (current) drug therapy: Secondary | ICD-10-CM | POA: Diagnosis not present

## 2018-04-02 DIAGNOSIS — I441 Atrioventricular block, second degree: Secondary | ICD-10-CM | POA: Insufficient documentation

## 2018-04-02 DIAGNOSIS — I714 Abdominal aortic aneurysm, without rupture: Secondary | ICD-10-CM | POA: Insufficient documentation

## 2018-04-02 DIAGNOSIS — I442 Atrioventricular block, complete: Secondary | ICD-10-CM | POA: Diagnosis not present

## 2018-04-02 DIAGNOSIS — I5032 Chronic diastolic (congestive) heart failure: Secondary | ICD-10-CM

## 2018-04-02 DIAGNOSIS — I5022 Chronic systolic (congestive) heart failure: Secondary | ICD-10-CM | POA: Diagnosis not present

## 2018-04-02 DIAGNOSIS — M109 Gout, unspecified: Secondary | ICD-10-CM | POA: Insufficient documentation

## 2018-04-02 DIAGNOSIS — G4733 Obstructive sleep apnea (adult) (pediatric): Secondary | ICD-10-CM | POA: Diagnosis not present

## 2018-04-02 DIAGNOSIS — Z9989 Dependence on other enabling machines and devices: Secondary | ICD-10-CM

## 2018-04-02 DIAGNOSIS — I2510 Atherosclerotic heart disease of native coronary artery without angina pectoris: Secondary | ICD-10-CM | POA: Insufficient documentation

## 2018-04-02 LAB — CBC
HEMATOCRIT: 48.3 % (ref 39.0–52.0)
HEMOGLOBIN: 14.5 g/dL (ref 13.0–17.0)
MCH: 28.9 pg (ref 26.0–34.0)
MCHC: 30 g/dL (ref 30.0–36.0)
MCV: 96.4 fL (ref 78.0–100.0)
Platelets: 133 10*3/uL — ABNORMAL LOW (ref 150–400)
RBC: 5.01 MIL/uL (ref 4.22–5.81)
RDW: 17.2 % — ABNORMAL HIGH (ref 11.5–15.5)
WBC: 5.7 10*3/uL (ref 4.0–10.5)

## 2018-04-02 LAB — BASIC METABOLIC PANEL
Anion gap: 8 (ref 5–15)
BUN: 29 mg/dL — AB (ref 8–23)
CO2: 30 mmol/L (ref 22–32)
CREATININE: 1.52 mg/dL — AB (ref 0.61–1.24)
Calcium: 9.7 mg/dL (ref 8.9–10.3)
Chloride: 102 mmol/L (ref 98–111)
GFR calc Af Amer: 50 mL/min — ABNORMAL LOW (ref >60)
GFR calc non Af Amer: 43 mL/min — ABNORMAL LOW (ref >60)
GLUCOSE: 151 mg/dL — AB (ref 70–99)
Potassium: 3.9 mmol/L (ref 3.5–5.1)
SODIUM: 140 mmol/L (ref 135–145)

## 2018-04-02 LAB — LIPID PANEL
CHOLESTEROL: 127 mg/dL (ref 0–200)
HDL: 25 mg/dL — ABNORMAL LOW (ref >40)
LDL Cholesterol: 61 mg/dL (ref 0–99)
TRIGLYCERIDES: 203 mg/dL — AB (ref <150)
Total CHOL/HDL Ratio: 5.1 RATIO
VLDL: 41 mg/dL — AB (ref 0–40)

## 2018-04-02 MED ORDER — TORSEMIDE 20 MG PO TABS: 40.0000 mg | ORAL_TABLET | Freq: Two times a day (BID) | ORAL | 6 refills | Status: DC

## 2018-04-02 NOTE — Patient Instructions (Signed)
Torsemide increased to 40mg  twice a day  Your physician has requested that you have an echocardiogram. Echocardiography is a painless test that uses sound waves to create images of your heart. It provides your doctor with information about the size and shape of your heart and how well your heart's chambers and valves are working. This procedure takes approximately one hour. There are no restrictions for this procedure.  Referral sent for endocrinology Dr. Cruzita Lederer, they will call you to schedule appointment   Labs in 10 days  Your physician recommends that you schedule a follow-up appointment in: one month with APP  Your physician recommends that you schedule a follow-up appointment in: 3 months with Dr. [name]

## 2018-04-02 NOTE — Progress Notes (Signed)
Advanced Heart Failure Note   Patient ID: [name], male   DOB: Feb 16, 1941, 77 y.o.   MRN: [id number] PCP: Dr. [name] Nephrologist: Dr Deterding Vascular: Dr Kellie Simmering HF: Creola Krotz Pulmonary: Dr Elsworth Soho  77 y.o. with history of ARDS/respiratory failure and tracheostomy in 07/2012 as well as DM, HTN, and COPD presented to the ER at [facility] with CHF in 10/2012. Patient had a prolonged hospitalization in 07/2012 with H1N1 influenza and pneumococcal PNA. This progressed to ARDS. He was intubated and ended up with tracheostomy. He had the trach removed. He also had a left empyema requiring chest tube, septic shock with elevated troponin, and AKI which resolved.  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   He was admitted again in 10/2012 from his nursing home with exertional dyspnea and orthopnea. He had gained 21 lbs. Echo at admission showed EF 35% with global hypokinesis that looked worse in the anteroseptal wall. He also was noted to have aortic stenosis rated as moderate to severe. He was diuresed and cathed, showing severe 3 vessel disease. He then had CABG-AVR (bioprosthetic valve).   He had a large AAA and underwent surgical repair in 07/2013.  He had AKI and Pseudomonas PNA post-operatively.  He was discharged to a nursing home. Echo in 1/15 showed EF 55-60% with mildly dilated and dysfunctional RV and a bioprosthetic aortic valve with mean gradient 32 mmHg (higher than expected).   Repeat limited echo in 08/2013 showed shower aortic valve mean gradient of 21 mmHg.   In 11/15, patient had a holter monitor placed for bradycardia.  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  Baseline bifascicular block.  His HR also was noted to drop into the 40s with exercise, suggesting exercise-associated heart block.  He underwent Medtronic-PCM placement in 1/16 by Dr Caryl Comes.   Echo in 10/16 showed fall in EF to 30% with mean aortic valve gradient 21 mmHg.  Repeat echo in 2/17 showed EF  20-25%, mean aortic valve gradient 24 mmHg with moderately decreased RV systolic function.  Cardiolite in 10/16 showed inferior scar with no ischemia.  He is RV pacing almost all the time.  Low dose DSE was done in 2/17 to assess for significant bioprosthetic valve stenosis => mean gradient with dobutamine was only 31 mmHg, so suspect no more than moderate stenosis.  He had St Jude CRT upgrade in 3/17.  He decided against ICD.  Repeat echo post-CRT in 8/17 showed EF 50%, diffuse hypokinesis, bioprosthetic aortic valve with mean gradient 27 mmHg.  Echo in 9/18 showed EF 50-55%, bioprosthetic aortic valve with mean gradient 25 mmHg.    Today he returns for followup of CHF.  Weight is up about 6 lbs recently.  Per his wife, he seems short of breath walking around though he denies significant dyspnea.  He does have orthopnea and has slept chronically with a wedge.  No chest pain.  Uses CPAP regularly.   He rides his recumbent bike for 45 minutes or so a day.   St Jude device interrogation: 98% BiV pacing, decreased thoracic impedance suggesting volume overload.   Labs (2/15): K 4, creatinine 1.6, BNP 166 Labs (3/15): Hgb 11.6, pro-BNP 1037, creatinine 1.76, BUN 30, LDL 75, HDL 26 Labs (8/15): K 3.9, creatinine 1.55, HCT 40.6, LDL 71, HDL 27 Labs (1/16): K 4.3, Creatinine 1.6, HCT 42.9 Labs (2/17): K 4.0 Creatinine 1.64, TSH normal.  Labs (3/17): K 4.5, creatinine 1.49, HCT 42.9 Labs (4/17): K 4, creatinine 1.48, BNP 125  Labs (6/17): LDL 27, HDL 24, TGs 245 Labs (7/17): K 4.9, creatinine 1.71 Labs (8/17): K 4.3, creatinine 1.7, LDL 64, HDl 25, TGs 198 Lab (06/16/2016): K 4.9 Creatinine 1.84  Labs (10/2016): Creatinine 1.5 K 3.9  Labs (6/18): K 4.4, creatinine 1.57, BNP 55 Labs (7/18): LDL 58, HDL 22, creatinine 1.64  1. PNA (H1N1 influenza + pneumococcus) in 8/29 complicated by respiratory failure/ARDS. Required tracheostomy, now weaned off. Had left empyema requiring chest tube.  Has developed  post-infectious pulmonary fibrosis.  2. Prostate CA s/p radiation treatment. Has indwelling foley.  3. OSA: using CPAP 4. HTN  5. Hyperlipidemia  6. COPD: History of heavy smoking. PFTs (4/14) with mixed obstructive (COPD) and restrictive (post-ARDS) picture.  7. Type II diabetes  8. Anemia of chronic disease.  9. Ischemic cardiomyopathy:  - Echo (1/14) with severely dilated LV, EF 30-35%, diffuse hypokinesis worse in the anteroseptal wall, grade II diastolic dysfunction, mild MR, AS interpreted as "moderate to severe" with aortic valve mean gradient 23 mmHg.  - Echo (4/14) witih EF 35-40%, mid to apical anteroseptal akinesis, moderate to severe AS with mean gradient 33 mmHg, AVA 0.91 cm^2.  -Echo (5/14) post CABG showed EF 50%, anteroseptal hypokinesis, bioprosthetic aortic valve well-seated. - Echo (1/15) with EF 55-60%, mild LVH, mildly dilated RV with mildly decreased systolic function, D-shaped interventricular septum, bioprosthetic aortic valve with mean gardient 32 mmHg.   - Echo (3/15) with EF 55-60%, mild LVH, bioprosthetic aortic valve with mean gradient 21 mmHg (lower), no AI, RV moderately dilated with mild to moderately decreased systolic function.   - Echo (10/16) with EF 30%, bioprosthetic aortic valve with mean gradient 21 mmHg.   - Echo (2/17) with EF 20-25%, mild LVH, aortic valve mean gradient 24 mmHg/peak 39 mmHg, RV moderately dilated with moderately decreased systolic function.  - St Jude CRT upgrade in 3/17 (did not want ICD).  - Echo (8/17) with EF 50%, diffuse hypokinesis, bioprosthetic aortic valve with mean gradient 27 mmHg, normal RV size with mildly decreased systolic function.   - AKI with lisinopril.  - Echo (9/18): EF 50-55%, bioprosthetic aortic valve with mean gradient 25 mmHg.  10. Aortic stenosis: Moderate to severe by echo in 4/14. Bioprosthetic #21 [facility] Ease aortic valve replacement in 4/14.  Low dose DSE (2/17) showed that Mr Medinger likely has no  more than moderate bioprosthetic valve stenosis with mean gradient up to 31 mmHg with dobutamine.  11. CAD: LHC (4/14) with 95% pLAD, 70-80% ostial ramus, 70% mLCx, 90% pRCA, EF 35%.  - CABG 4/14 with LIMA-LAD, seq SVG-ramus and OM1, SVG-PDA.  - Cardiolite (10/16) with EF 42%, fixed inferior defect, no ischemia.   12. Carotid stenosis: Carotid dopplers (5/62) with 13-08% LICA stenosis.  Carotids (6/57) with 84-69% LICA stenosis. Carotids (6/29) with 52-84% LICA stenosis.  - Carotid dopplers (1/32): 44-01% LICA stenosis.  - Carotid dopplers (0/27): 25-36% LICA stenosis.  13. AAA: 5/14 CT showed > 6 cm AAA, also right iliac aneurysm. Not stent graft candidate.  7/14 CT showed 7 cm AAA.  Surgical repair 1/15.  14. CKD: AKI in 7/14 from contrast nephropathy.  AKI in 1/15 after AAA repair.  15. Contrast allergy.  16. ABIs 9/14 were normal.  17. Gout 18. 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  HR drops with exercise (likely exercise-induced heart block).  Medtronic dual chamber PPM placed in 2016.  Recent pacemaker interrogation showed complete heart block.  He is pacemaker-dependent,  upgraded to CRT in 3/17.   SH: Quit smoking in 1/14, married, no ETOH, lives in [place].   FH: No premature CAD  ROS: All systems reviewed and negative except as per HPI.    Current Outpatient Medications  Medication Sig Dispense Refill  . allopurinol (ZYLOPRIM) 300 MG tablet Take 300 mg by mouth daily.    Marland Kitchen aspirin EC 81 MG tablet Take 81 mg by mouth daily.    Marland Kitchen atorvastatin (LIPITOR) 80 MG tablet TAKE 1 TABLET BY MOUTH EVERY DAY BEFORE BREAKFAST 30 tablet 3  . atorvastatin (LIPITOR) 80 MG tablet TAKE 1 TABLET BY MOUTH EVERY DAY BEFORE BREAKFAST 30 tablet 3  . bisoprolol (ZEBETA) 5 MG tablet TAKE 1/2 TABLET BY MOUTH DAILY 15 tablet 0  . calcitRIOL (ROCALTROL) 0.25 MCG capsule Take 0.25 mcg by mouth daily.     . fexofenadine (ALLEGRA) 180 MG tablet Take 90 mg by mouth daily.     Marland Kitchen  ipratropium-albuterol (DUONEB) 0.5-2.5 (3) MG/3ML SOLN TAKE 3 MLS BY NEBULIZATION EVERY 6 (SIX) HOURS AS NEEDED. DX: J44.9 360 mL 5  . KLOR-CON M10 10 MEQ tablet TAKE 2 TABLETS (20 MEQ TOTAL) BY MOUTH 2 (TWO) TIMES DAILY. 360 tablet 1  . nitroGLYCERIN (NITROSTAT) 0.4 MG SL tablet PLACE 1 TABLET UNDER TONGUE EVERY 5 MINUTES AS NEEDED FOR CHEST PAIN 25 tablet 0  . pantoprazole (PROTONIX) 40 MG tablet Take 40 mg by mouth at bedtime.     . torsemide (DEMADEX) 20 MG tablet Take 2 tablets (40 mg total) by mouth 2 (two) times daily. 120 tablet 6   No current facility-administered medications for this encounter.     Vitals:   04/02/18 1345  BP: 124/85  Pulse: 73  SpO2: 98%  Weight: 108.1 kg (238 lb 4 oz)    Wt Readings from Last 3 Encounters:  04/02/18 108.1 kg (238 lb 4 oz)  10/29/17 108.9 kg (240 lb)  08/27/17 110.8 kg (244 lb 3.2 oz)    PHYSICAL EXAM General: NAD Neck: Thick, JVP 8-9 cm, no thyromegaly or thyroid nodule.  Lungs: Clear to auscultation bilaterally with normal respiratory effort. CV: Nondisplaced PMI.  Heart regular S1/S2, no S3/S4, 2/6 SEM RUSB with clear S2  1+ edema 1/4 to knees bilaterally.  No carotid bruit.  Normal pedal pulses.  Abdomen: Soft, nontender, no hepatosplenomegaly, no distention.  Skin: Intact without lesions or rashes.  Neurologic: Alert and oriented x 3.  Psych: Normal affect. Extremities: No clubbing or cyanosis.  HEENT: Normal.   ASSESSMENT & PLAN: 1. CAD: Status post CABG for 3VD.  Cardiolite in 10/16 with inferior scar, no ischemia.  No chest pain.  - Continue ASA, statin.   2. Chronic systolic CHF:  Echo in 8/11 showed EF down to 20-25%.  No ischemia on Cardiolite.  Constant RV pacing may have led to decline in EF.  We considered bioprosthetic valve stenosis as cause of fall in EF, but low gradient DSE did not suggest severe stenosis.  Echo 8/17 after CRT upgrade shows improved EF to 50%.  Last echo in 9/18 with EF 50-55%.  On exam and by  [facility] he is mildly volume overloaded and weight is up.  - Continue bisoprolol 2.5 mg daily.   - AKI with lisinopril, will leave off.  - Increase torsemide to 40 mg bid.  BMET today and again in 10 days.  - I will arrange for echocardiogram to follow EF and aortic valve.    3. Pulmonary: Mixed restrictive (post-ARDS)/obstructive picture  on CT. Suspect that intrinsic lung disease (post-infectious pulmonary fibrosis) is a contributor to his dyspnea and hypoxemia. He is now off oxygen (just using CPAP at night).   4. Carotid stenosis: Followed at [facility], repeat dopplers 3/20. 5. Hyperlipidemia: Continue statin. Check lipids today.  6. AAA: s/p surgical repair. 7. Status post bioprosthetic AVR: Gradient across the aortic valve has been elevated. There was some concern for possible low gradient severe bioprosthetic aortic stenosis based on past echo, but low dose DSE suggests that valvular stenosis was no more than moderate.  Mean gradient 25 mmHg across bioprosthetic valve on echo 9/18.   - He will need antibiotic prophylaxis with dental work.  - Echo to follow aortic valve to be arranged.   8. CKD: Stage III. Check BMET today.  9. 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  S/p Medtronic PCM 1/16.   Patient is pacemaker-dependent with complete heart block.  He had upgrade to CRT in 3/17.   10. OSA: He is on CPAP.  Continue CPAP nightly.  11. Gout: on allopurinol.  12. COPD:  per pulmonary.  13. Type II diabetes: Asks for referral to endocrinology, will send to Dr. Cruzita Lederer .  Followup 1 month with APP to reassess volume and 3 months with me.   Loralie Champagne 04/02/2018

## 2018-04-03 ENCOUNTER — Telehealth (HOSPITAL_COMMUNITY): Payer: Self-pay | Admitting: Pharmacist

## 2018-04-03 ENCOUNTER — Other Ambulatory Visit (HOSPITAL_COMMUNITY): Payer: Self-pay | Admitting: Cardiology

## 2018-04-03 ENCOUNTER — Other Ambulatory Visit (HOSPITAL_COMMUNITY): Payer: Self-pay | Admitting: Pharmacist

## 2018-04-03 MED ORDER — OMEGA-3-ACID ETHYL ESTERS 1 G PO CAPS: 2.0000 g | ORAL_CAPSULE | Freq: Two times a day (BID) | ORAL | 5 refills | Status: DC

## 2018-04-03 MED ORDER — ICOSAPENT ETHYL 1 G PO CAPS: 2.0000 g | ORAL_CAPSULE | Freq: Two times a day (BID) | ORAL | 5 refills | Status: DC

## 2018-04-03 NOTE — Telephone Encounter (Signed)
Patient is not able to afford the $150 copay for Vascepa. Will switch to generic Lovaza 2 gm BID which is no charge through his insurance. Patient aware and agreeable to change.  ID: [id number] BIN: [id number] PCN: [id number] GRP: [id number]  [name] K. Velva Harman, PharmD, BCPS, CPP Clinical Pharmacist Phone: [phone] 04/03/2018 2:42 PM

## 2018-04-04 ENCOUNTER — Other Ambulatory Visit (HOSPITAL_COMMUNITY): Payer: Self-pay | Admitting: Pharmacist

## 2018-04-04 NOTE — Telephone Encounter (Signed)
[facility] pharmacy called back today to say that the generic Lovaza is in fact NOT covered by Mr. [place] insurance and will be >$250/mo. Mr. Rosenbloom is aware and he would rather pay the $150/mo for the Vascepa or try to get samples at his PCP's office.   Ruta Hinds. Velva Harman, PharmD, BCPS, CPP Clinical Pharmacist Phone: [phone] 04/04/2018 2:39 PM

## 2018-04-08 ENCOUNTER — Other Ambulatory Visit (HOSPITAL_COMMUNITY): Payer: Self-pay | Admitting: Cardiology

## 2018-04-11 ENCOUNTER — Other Ambulatory Visit (HOSPITAL_COMMUNITY): Payer: Self-pay | Admitting: Cardiology

## 2018-04-16 ENCOUNTER — Ambulatory Visit (HOSPITAL_COMMUNITY)
Admission: RE | Admit: 2018-04-16 | Discharge: 2018-04-16 | Disposition: A | Discharge status: Home / Self Care | Payer: Medicare Other | Source: Ambulatory Visit | Attending: Cardiology | Admitting: Cardiology

## 2018-04-16 ENCOUNTER — Ambulatory Visit (HOSPITAL_BASED_OUTPATIENT_CLINIC_OR_DEPARTMENT_OTHER)
Admission: RE | Admit: 2018-04-16 | Discharge: 2018-04-16 | Disposition: A | Discharge status: Home / Self Care | Payer: Medicare Other | Source: Ambulatory Visit

## 2018-04-16 DIAGNOSIS — I5022 Chronic systolic (congestive) heart failure: Secondary | ICD-10-CM | POA: Diagnosis not present

## 2018-04-16 DIAGNOSIS — Z953 Presence of xenogenic heart valve: Secondary | ICD-10-CM | POA: Diagnosis not present

## 2018-04-16 DIAGNOSIS — I5032 Chronic diastolic (congestive) heart failure: Secondary | ICD-10-CM

## 2018-04-16 DIAGNOSIS — I5042 Chronic combined systolic (congestive) and diastolic (congestive) heart failure: Secondary | ICD-10-CM | POA: Insufficient documentation

## 2018-04-16 DIAGNOSIS — I350 Nonrheumatic aortic (valve) stenosis: Secondary | ICD-10-CM | POA: Diagnosis not present

## 2018-04-16 DIAGNOSIS — I517 Cardiomegaly: Secondary | ICD-10-CM | POA: Diagnosis not present

## 2018-04-16 LAB — BASIC METABOLIC PANEL
Anion gap: 9 (ref 5–15)
BUN: 44 mg/dL — ABNORMAL HIGH (ref 8–23)
CO2: 30 mmol/L (ref 22–32)
Calcium: 9.4 mg/dL (ref 8.9–10.3)
Chloride: 101 mmol/L (ref 98–111)
Creatinine, Ser: 2.14 mg/dL — ABNORMAL HIGH (ref 0.61–1.24)
GFR, EST AFRICAN AMERICAN: 33 mL/min — AB (ref >60)
GFR, EST NON AFRICAN AMERICAN: 28 mL/min — AB (ref >60)
Glucose, Bld: 227 mg/dL — ABNORMAL HIGH (ref 70–99)
Potassium: 3.5 mmol/L (ref 3.5–5.1)
SODIUM: 140 mmol/L (ref 135–145)

## 2018-04-16 NOTE — Progress Notes (Signed)
  Echocardiogram 2D Echocardiogram has been performed.  Madelaine Etienne 04/16/2018, 10:48 AM

## 2018-04-17 ENCOUNTER — Ambulatory Visit (INDEPENDENT_AMBULATORY_CARE_PROVIDER_SITE_OTHER): Payer: Medicare Other | Admitting: *Deleted

## 2018-04-17 ENCOUNTER — Other Ambulatory Visit (HOSPITAL_COMMUNITY): Payer: Self-pay | Admitting: *Deleted

## 2018-04-17 DIAGNOSIS — I442 Atrioventricular block, complete: Secondary | ICD-10-CM

## 2018-04-17 DIAGNOSIS — I5022 Chronic systolic (congestive) heart failure: Secondary | ICD-10-CM | POA: Diagnosis not present

## 2018-04-17 MED ORDER — TORSEMIDE 20 MG PO TABS: 40.0000 mg | ORAL_TABLET | Freq: Two times a day (BID) | ORAL | 6 refills | Status: DC

## 2018-04-17 NOTE — Progress Notes (Signed)
Remote pacemaker transmission.   

## 2018-04-19 ENCOUNTER — Encounter: Payer: Self-pay | Admitting: Cardiology

## 2018-04-25 ENCOUNTER — Ambulatory Visit (HOSPITAL_COMMUNITY)
Admission: RE | Admit: 2018-04-25 | Discharge: 2018-04-25 | Disposition: A | Discharge status: Home / Self Care | Payer: Medicare Other | Source: Ambulatory Visit | Attending: Internal Medicine | Admitting: Internal Medicine

## 2018-04-25 DIAGNOSIS — I5022 Chronic systolic (congestive) heart failure: Secondary | ICD-10-CM

## 2018-04-25 LAB — BASIC METABOLIC PANEL
Anion gap: 7 (ref 5–15)
BUN: 35 mg/dL — AB (ref 8–23)
CALCIUM: 9.7 mg/dL (ref 8.9–10.3)
CO2: 31 mmol/L (ref 22–32)
Chloride: 102 mmol/L (ref 98–111)
Creatinine, Ser: 1.82 mg/dL — ABNORMAL HIGH (ref 0.61–1.24)
GFR calc Af Amer: 40 mL/min — ABNORMAL LOW (ref >60)
GFR, EST NON AFRICAN AMERICAN: 34 mL/min — AB (ref >60)
Glucose, Bld: 159 mg/dL — ABNORMAL HIGH (ref 70–99)
Potassium: 4.1 mmol/L (ref 3.5–5.1)
Sodium: 140 mmol/L (ref 135–145)

## 2018-04-29 DIAGNOSIS — E782 Mixed hyperlipidemia: Secondary | ICD-10-CM | POA: Diagnosis not present

## 2018-04-29 DIAGNOSIS — N2581 Secondary hyperparathyroidism of renal origin: Secondary | ICD-10-CM | POA: Diagnosis not present

## 2018-04-29 DIAGNOSIS — D631 Anemia in chronic kidney disease: Secondary | ICD-10-CM | POA: Diagnosis not present

## 2018-04-29 DIAGNOSIS — Z950 Presence of cardiac pacemaker: Secondary | ICD-10-CM | POA: Diagnosis not present

## 2018-04-29 DIAGNOSIS — I350 Nonrheumatic aortic (valve) stenosis: Secondary | ICD-10-CM | POA: Diagnosis not present

## 2018-04-29 DIAGNOSIS — N183 Chronic kidney disease, stage 3 (moderate): Secondary | ICD-10-CM | POA: Diagnosis not present

## 2018-04-29 DIAGNOSIS — E1122 Type 2 diabetes mellitus with diabetic chronic kidney disease: Secondary | ICD-10-CM | POA: Diagnosis not present

## 2018-04-29 DIAGNOSIS — I129 Hypertensive chronic kidney disease with stage 1 through stage 4 chronic kidney disease, or unspecified chronic kidney disease: Secondary | ICD-10-CM | POA: Diagnosis not present

## 2018-04-29 DIAGNOSIS — I255 Ischemic cardiomyopathy: Secondary | ICD-10-CM | POA: Diagnosis not present

## 2018-04-29 DIAGNOSIS — I2510 Atherosclerotic heart disease of native coronary artery without angina pectoris: Secondary | ICD-10-CM | POA: Diagnosis not present

## 2018-04-29 DIAGNOSIS — M109 Gout, unspecified: Secondary | ICD-10-CM | POA: Diagnosis not present

## 2018-04-29 DIAGNOSIS — C61 Malignant neoplasm of prostate: Secondary | ICD-10-CM | POA: Diagnosis not present

## 2018-04-30 ENCOUNTER — Other Ambulatory Visit: Payer: Self-pay | Admitting: Internal Medicine

## 2018-04-30 ENCOUNTER — Encounter: Payer: Self-pay | Admitting: Internal Medicine

## 2018-04-30 ENCOUNTER — Ambulatory Visit (INDEPENDENT_AMBULATORY_CARE_PROVIDER_SITE_OTHER): Payer: Medicare Other | Admitting: Internal Medicine

## 2018-04-30 VITALS — BP 128/80 | HR 69 | Ht 65.0 in | Wt 237.0 lb

## 2018-04-30 DIAGNOSIS — E1165 Type 2 diabetes mellitus with hyperglycemia: Secondary | ICD-10-CM

## 2018-04-30 DIAGNOSIS — E1159 Type 2 diabetes mellitus with other circulatory complications: Secondary | ICD-10-CM

## 2018-04-30 DIAGNOSIS — I6523 Occlusion and stenosis of bilateral carotid arteries: Secondary | ICD-10-CM

## 2018-04-30 LAB — POCT GLYCOSYLATED HEMOGLOBIN (HGB A1C): Hemoglobin A1C: 8.3 % — AB (ref 4.0–5.6)

## 2018-04-30 MED ORDER — SEMAGLUTIDE(0.25 OR 0.5MG/DOS) 2 MG/1.5ML ~~LOC~~ SOPN: 0.5000 mg | PEN_INJECTOR | SUBCUTANEOUS | 5 refills | Status: DC

## 2018-04-30 MED ORDER — INSULIN DEGLUDEC 200 UNIT/ML ~~LOC~~ SOPN: 36.0000 [IU] | PEN_INJECTOR | Freq: Every day | SUBCUTANEOUS | 5 refills | Status: DC

## 2018-04-30 NOTE — Progress Notes (Signed)
Patient ID: [name], male   DOB: [birth year], 77 y.o.   MRN: [id number]   HPI: [name] is a 77 y.o.-year-old male, referred by his cardiologist, Dr. 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  He is here with his wife offers part of the history especially about his diet and past medical history.  Last hemoglobin A1c was: Lab Results  Component Value Date   HGBA1C 5.5 10/24/2012   HGBA1C 6.4 (H) 07/24/2012   Pt is on a regimen of: - Tresiba 36 units daily - restarted 03/06/2018 He was on Januvia 50 mg daily, but this did not help >> stopped 2018. He was on Jardiance. He was on Ozempic but taken off - ineffective.  Pt checks his sugars 1x a day and they are-per meter download: - am: 135-237, 256 - 2h after b'fast: n/c - before lunch: n/c - 2h after lunch: n/c - before dinner: n/c - 2h after dinner: n/c - bedtime: n/c - nighttime: n/c Lowest sugar was 135; ? hypoglycemia awareness.  Highest sugar was 256.  Glucometer: One Touch Verio  Pt's meals are: - Breakfast: Banana, 2 clementines, different fruit in season, low-salt chips - Lunch: Meat, 2 veggies, toast, and sweet tea - Dinner: Sandwich (banana or meat, tomato), 1 Clementine - Snacks: Frequently after dinner He exercises on the recumbent bike 4 to 5 days a week 45 to 50 minutes each day.  - + CKD, last BUN/creatinine:  Lab Results  Component Value Date   BUN 35 (H) 04/25/2018   BUN 44 (H) 04/16/2018   CREATININE 1.82 (H) 04/25/2018   CREATININE 2.14 (H) 04/16/2018   - + HL; last set of lipids: Lab Results  Component Value Date   CHOL 127 04/02/2018   HDL 25 (L) 04/02/2018   LDLCALC 61 04/02/2018   LDLDIRECT 61.5 05/15/2013   TRIG 203 (H) 04/02/2018   CHOLHDL 5.1 04/02/2018  On Lipitor 80.  His cardiologist also recommended Vascepa - samples for 6 weeks -  but he could not afford this. On Fish oil.   - last eye exam was in 10/2017. No DR reportedly.   - no numbness and tingling in his feet.  Pt has FH of DM in S, B, Father.  He also has HTN, OSA.  ROS: Constitutional: no weight gain, no weight loss, + fatigue, no subjective hyperthermia, no subjective hypothermia, + nocturia Eyes: no blurry vision, no xerophthalmia ENT: no sore throat, no nodules palpated in neck, no dysphagia, no odynophagia, no hoarseness, no tinnitus, + hypoacusis Cardiovascular: no CP, + SOB, no palpitations, + leg swelling Respiratory: no cough, + SOB, no wheezing Gastrointestinal: no N, no V, + D, no C + acid reflux Musculoskeletal: no muscle, + joint aches Skin: + Rash on both forearms, no hair loss, + itching, + easy bruising Neurological: no tremors, no numbness or tingling/no dizziness/no HAs Psychiatric: no depression, no anxiety + Low libido  Past Medical History:  Diagnosis Date  . AAA (abdominal aortic aneurysm) ([place])    5/14 CT showed > 6 cm AAA, also right iliac aneurysm. Not stent graft candidate.   . Anemia of chronic disease   . Anxiety    pt. admits that he has anxiety at times   . Aortic stenosis    Moderate to severe by echo in 4/14. Bioprosthetic #[facility] Ease aortic valve replacement  in 4/14.   . Arthritis   . CAD (coronary artery disease)    a. 4/14 CABG: LIMA-LAD, seq SVG-ramus & OM1, SVG-PDA  . Carotid arterial disease (HCC)    Carotid dopplers (7/74) with 12-87% LICA stenosis.   . CHF (congestive heart failure) ([place]) 07/2012; 10/17/2012  . Chronic kidney disease    increased creatinine recently - being followed, near kidney failure fr. contrast dye   . Chronic systolic heart failure (HCC)    a. 1/14 ECHO: sev dil LV, EF30-35%, diff HK, mild MR, AS severe, AV grad 35 b. 8/14 ECHO: EF 55-60%, mild biopros AV sten mn grad. 25, RV mild dil, RA mild dil  . Complication of anesthesia    during last surgery had to be given special  medicine b/c "something dropped" during surgery   . COPD (chronic obstructive pulmonary disease) ([place])    a.  History of heavy smoking. PFTs (4/14) with mixed obstructive (COPD) and restrictive (post-ARDS) picture.   . Diabetes mellitus without complication ([place])   . Esophageal reflux   . History of blood transfusion    "lots since January" (10/17/2012)  . Hyperlipidemia   . Hypertension   . Ischemic cardiomyopathy   . Ischemic cardiomyopathy    a. 4/14 LHC: pLAD 95, ost ramus 70-80, mLCx 90, EF 30%  . OSA (obstructive sleep apnea)    on cpap- every sleep time.   . Pneumonia 07/2012   PNA (H1N1 influenza + pneumococcus) in 8/67 complicated by respiratory failure/ARDS. Required tracheostomy, now weaned off. Had left empyema requiring chest tube.   . Prediabetes   . Prostate cancer [facility])     s/p radiation treatment. - (PT. DENIES)Has indwelling foley.   . Shortness of breath    still goes deer hunting by himself   Past Surgical History:  Procedure Laterality Date  . ABDOMINAL AORTIC ANEURYSM REPAIR N/A 07/10/2013   Procedure: Resection and Graftiong of perirenal AAA; Insertion 14 x 8 Hemashield Graft Aorta to Left Common Iliac and to Right Common Femoral Artery With Ligastion of Right External and Interanl Iliac Artery;  Surgeon: Mal Misty, MD;  Location: [place];  Service: Vascular;  Laterality: N/A;  . ANAL FISSURE REPAIR  2008  . AORTIC VALVE REPLACEMENT N/A 10/25/2012   Procedure: AORTIC VALVE REPLACEMENT (AVR);  Surgeon: Melrose Nakayama, MD;  Location: [place];  Service: Open Heart Surgery;  Laterality: N/A;  . CARDIAC VALVE REPLACEMENT    . CORONARY ARTERY BYPASS GRAFT N/A 10/25/2012   Procedure: CORONARY ARTERY BYPASS GRAFTING (CABG);  Surgeon: Melrose Nakayama, MD;  Location: [place];  Service: Open Heart Surgery;  Laterality: N/A;  times 4 using left internal mammary artery and endoscopically harvested bilateral saphenous vein   . EP IMPLANTABLE DEVICE N/A 09/22/2015    Procedure: BiV Upgrade;  Surgeon: Deboraha Sprang, MD;  Location: [place] CV LAB;  Service: Cardiovascular;  Laterality: N/A;  . INTRAOPERATIVE TRANSESOPHAGEAL ECHOCARDIOGRAM N/A 10/25/2012   Procedure: INTRAOPERATIVE TRANSESOPHAGEAL ECHOCARDIOGRAM;  Surgeon: Melrose Nakayama, MD;  Location: [place];  Service: Open Heart Surgery;  Laterality: N/A;  . LEFT AND RIGHT HEART CATHETERIZATION WITH CORONARY ANGIOGRAM N/A 10/22/2012   Procedure: LEFT AND RIGHT HEART CATHETERIZATION WITH CORONARY ANGIOGRAM;  Surgeon: Larey Dresser, MD;  Location: [facility] CATH LAB;  Service: Cardiovascular;  Laterality: N/A;  . NASAL FRACTURE SURGERY  1970's  . PACEMAKER REVISION  09/22/2015   pacemaker upgrade   . PERMANENT PACEMAKER INSERTION N/A 07/15/2014   Procedure: PERMANENT  PACEMAKER INSERTION;  Surgeon: Deboraha Sprang, MD;  Location: [facility] CATH LAB;  Service: Cardiovascular;  Laterality: N/A;  . TRACHEOSTOMY  07/2012  . TRACHEOSTOMY CLOSURE  08/2012  . TRANSURETHRAL MICROWAVE THERAPY  10/15/2012   Social History   Socioeconomic History  . Marital status: Married    Spouse name: Not on file  . Number of children:  2  . Years of education: Not on file  . Highest education level: Not on file  Occupational History  .  Retired  Scientific laboratory technician  . Financial resource strain: Not on file  . Food insecurity:    Worry: Not on file    Inability: Not on file  . Transportation needs:    Medical: Not on file    Non-medical: Not on file  Tobacco Use  . Smoking status: Former Smoker    Packs/day: 2.00    Years: 58.00    Pack years: 116.00    Types: Cigarettes    Last attempt to quit: 07/20/2012    Years since quitting: 5.7  . Smokeless tobacco: Never Used  Substance and Sexual Activity  . Alcohol use: No    Alcohol/week:  Beer once or twice a year: 2 drinks    Comment: 10/17/2012 "years since I've had a drink; never had problem with it"  . Drug use: No  . Sexual activity: Not Currently   Current Outpatient  Medications on File Prior to Visit  Medication Sig Dispense Refill  . allopurinol (ZYLOPRIM) 300 MG tablet Take 300 mg by mouth daily.    Marland Kitchen aspirin EC 81 MG tablet Take 81 mg by mouth daily.    Marland Kitchen atorvastatin (LIPITOR) 80 MG tablet TAKE 1 TABLET BY MOUTH EVERY DAY BEFORE BREAKFAST 30 tablet 3  . atorvastatin (LIPITOR) 80 MG tablet TAKE 1 TABLET BY MOUTH EVERY DAY BEFORE BREAKFAST 30 tablet 3  . bisoprolol (ZEBETA) 5 MG tablet TAKE 1/2 TABLET BY MOUTH DAILY 15 tablet 5  . calcitRIOL (ROCALTROL) 0.25 MCG capsule Take 0.25 mcg by mouth daily.     . fexofenadine (ALLEGRA) 180 MG tablet Take 90 mg by mouth daily.     Vanessa Kick Ethyl (VASCEPA) 1 g CAPS Take 2 capsules (2 g total) by mouth 2 (two) times daily. 120 capsule 5  . ipratropium-albuterol (DUONEB) 0.5-2.5 (3) MG/3ML SOLN TAKE 3 MLS BY NEBULIZATION EVERY 6 (SIX) HOURS AS NEEDED. DX: J44.9 360 mL 5  . KLOR-CON M10 10 MEQ tablet TAKE 2 TABLETS (20 MEQ TOTAL) BY MOUTH 2 (TWO) TIMES DAILY. 360 tablet 1  . nitroGLYCERIN (NITROSTAT) 0.4 MG SL tablet PLACE 1 TABLET UNDER TONGUE EVERY 5 MINUTES AS NEEDED FOR CHEST PAIN 25 tablet 0  . pantoprazole (PROTONIX) 40 MG tablet Take 40 mg by mouth at bedtime.     . torsemide (DEMADEX) 20 MG tablet Take 2 tablets (40 mg total) by mouth 2 (two) times daily. 120 tablet 6   No current facility-administered medications on file prior to visit.    Allergies  Allergen Reactions  . Omnipaque [Iohexol] Other (See Comments)    Decreased kidney function  . Primaxin [Imipenem] Hives  . Cephalosporins Rash    Blisters  . Lisinopril     Drops Blood pressure  nausea   . Clindamycin/Lincomycin Swelling and Rash   Family History  Problem Relation Age of Onset  . Cancer Mother   . Heart disease Father   . Congestive Heart Failure Unknown   . Heart disease Unknown    Also,  hyperlipidemia, heart disease, hypertension and brother. Thyroid disease and cancer in mother.  PE: BP 128/80   Pulse 69   Ht 5\' 5"   (1.651 m) Comment: measured  Wt 237 lb (107.5 kg)   BMI 39.44 kg/m  Wt Readings from Last 3 Encounters:  04/30/18 237 lb (107.5 kg)  04/02/18 238 lb 4 oz (108.1 kg)  10/29/17 240 lb (108.9 kg)   Constitutional: overweight, in NAD Eyes: PERRLA, EOMI, no exophthalmos ENT: moist mucous membranes, no thyromegaly, no cervical lymphadenopathy Cardiovascular: RRR, No MRG Respiratory: CTA B Gastrointestinal: abdomen soft, NT, ND, BS+ Musculoskeletal: no deformities, strength intact in all 4 Skin: moist, warm, + violaceous rash on both forearms, with sanguinolent small bullae at the site of scratching Neurological: no tremor with outstretched hands, DTR normal in all 4  ASSESSMENT: 1. 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 Patient with long-standing, uncontrolled diabetes, on basal insulin only, which became insufficient. HbA1c today: 8.3% (higher). Reviewing his meter downloads, he is only checking his sugars in the morning and they are fluctuating between slightly above target to 250s.  This morning, for example, his fasting CBG was 135 as he did not snack last night after dinner.  He mentions that he is a snacking frequently at night and sugars are higher depending on this.  He is not usually missing his insulin injections. -We discussed that he absolutely needs to cut down the snacks and ideally not eat or drink anything except water after dinner -We also discussed about the fact that we are limited in our medication choices by his kidney disease, but we can definitely retry Ozempic.  This was ineffective for him in the past, however, we are not trying this by itself, but in conjunction with [place].  I would have preferred to be able to start an SGL 2 inhibitor due to his previous history of CHF, however, his kidney function is too low for this. - Patient  was given a sample of Ozempic and of [place].  We will start Ozempic at a low dose for 1 injection and then increase to 0.5 mg weekly. - I suggested to:  Patient Instructions  Please continue: - Tresiba U200 36 units daily  Please start Ozempic 0.25 mg weekly in a.m. (for example on Sunday morning) x 1 week, then increase to 0.5 mg weekly in a.m. if no nausea or hypoglycemia.  Please return in 3 months with your sugar log.   - Strongly advised him to check sugars at different times of the day - check 1x a day, but start rotating checks - given sugar log and advised how to fill it and to bring it at next appt  - given foot care handout and explained the principles  - given instructions for hypoglycemia management "15-15 rule"  - advised for yearly eye exams  - Return to clinic in 3 mo with sugar log   Philemon Kingdom, MD PhD [facility]

## 2018-04-30 NOTE — Patient Instructions (Signed)
Please continue: - Tresiba U200 36 units daily  Please start Ozempic 0.25 mg weekly in a.m. (for example on Sunday morning) x 1 week, then increase to 0.5 mg weekly in a.m. if no nausea or hypoglycemia.  Please return in 3 months with your sugar log.   PATIENT INSTRUCTIONS FOR TYPE 2 DIABETES:  **Please join MyChart!** - see attached instructions about how to join if you have not done so already.  DIET AND EXERCISE Diet and exercise is an important part of diabetic treatment.  We recommended aerobic exercise in the form of brisk walking (working between 40-60% of maximal aerobic capacity, similar to brisk walking) for 150 minutes per week (such as 30 minutes five days per week) along with 3 times per week performing 'resistance' training (using various gauge rubber tubes with handles) 5-10 exercises involving the major muscle groups (upper body, lower body and core) performing 10-15 repetitions (or near fatigue) each exercise. Start at half the above goal but build slowly to reach the above goals. If limited by weight, joint pain, or disability, we recommend daily walking in a swimming pool with water up to waist to reduce pressure from joints while allow for adequate exercise.    BLOOD GLUCOSES Monitoring your blood glucoses is important for continued management of your diabetes. Please check your blood glucoses 2-4 times a day: fasting, before meals and at bedtime (you can rotate these measurements - e.g. one day check before the 3 meals, the next day check before 2 of the meals and before bedtime, etc.).   HYPOGLYCEMIA (low blood sugar) Hypoglycemia is usually a reaction to not eating, exercising, or taking too much insulin/ other diabetes drugs.  Symptoms include tremors, sweating, hunger, confusion, headache, etc. Treat IMMEDIATELY with 15 grams of Carbs: . 4 glucose tablets .  cup regular juice/soda . 2 tablespoons raisins . 4 teaspoons sugar . 1 tablespoon honey Recheck blood  glucose in 15 mins and repeat above if still symptomatic/blood glucose <100.  RECOMMENDATIONS TO REDUCE YOUR RISK OF DIABETIC COMPLICATIONS: * Take your prescribed MEDICATION(S) * Follow a DIABETIC diet: Complex carbs, fiber rich foods, (monounsaturated and polyunsaturated) fats * AVOID saturated/trans fats, high fat foods, >2,300 mg salt per day. * EXERCISE at least 5 times a week for 30 minutes or preferably daily.  * DO NOT SMOKE OR DRINK more than 1 drink a day. * Check your FEET every day. Do not wear tightfitting shoes. Contact us if you develop an ulcer * See your EYE doctor once a year or more if needed * Get a FLU shot once a year * Get a PNEUMONIA vaccine once before and once after age 90 years  GOALS:  * Your Hemoglobin A1c of <7%  * fasting sugars need to be <130 * after meals sugars need to be <180 (2h after you start eating) * Your Systolic BP should be 967 or lower  * Your Diastolic BP should be 80 or lower  * Your HDL (Good Cholesterol) should be 40 or higher  * Your LDL (Bad Cholesterol) should be 100 or lower. * Your Triglycerides should be 150 or lower  * Your Urine microalbumin (kidney function) should be <30 * Your Body Mass Index should be 25 or lower    Please consider the following ways to cut down carbs and fat and increase fiber and micronutrients in your diet: - substitute whole grain for white bread or pasta - substitute brown rice for white rice - substitute 90-calorie flat bread  pieces for slices of bread when possible - substitute sweet potatoes or yams for white potatoes - substitute humus for margarine - substitute tofu for cheese when possible - substitute almond or rice milk for regular milk (would not drink soy milk daily due to concern for soy estrogen influence on breast cancer risk) - substitute dark chocolate for other sweets when possible - substitute water - can add lemon or orange slices for taste - for diet sodas (artificial sweeteners  will trick your body that you can eat sweets without getting calories and will lead you to overeating and weight gain in the long run) - do not skip breakfast or other meals (this will slow down the metabolism and will result in more weight gain over time)  - can try smoothies made from fruit and almond/rice milk in am instead of regular breakfast - can also try old-fashioned (not instant) oatmeal made with almond/rice milk in am - order the dressing on the side when eating salad at a restaurant (pour less than half of the dressing on the salad) - eat as little meat as possible - can try juicing, but should not forget that juicing will get rid of the fiber, so would alternate with eating raw veg./fruits or drinking smoothies - use as little oil as possible, even when using olive oil - can dress a salad with a mix of balsamic vinegar and lemon juice, for e.g. - use agave nectar, stevia sugar, or regular sugar rather than artificial sweateners - steam or broil/roast veggies  - snack on veggies/fruit/nuts (unsalted, preferably) when possible, rather than processed foods - reduce or eliminate aspartame in diet (it is in diet sodas, chewing gum, etc) Read the labels!  Try to read Dr. Janene Harvey book: "Program for Reversing Diabetes" for other ideas for healthy eating.

## 2018-04-30 NOTE — Addendum Note (Signed)
Addended by: Cardell Peach I on: 04/30/2018 02:00 PM   Modules accepted: Orders

## 2018-05-06 ENCOUNTER — Encounter: Payer: Self-pay | Admitting: Internal Medicine

## 2018-05-06 NOTE — Progress Notes (Signed)
Advanced Heart Failure Note   Patient ID: [name], male   DOB: September 09, 1940, 77 y.o.   MRN: [id number] PCP: Dr. [name] Nephrologist: Dr Deterding Vascular: Dr Kellie Simmering HF: [place] Pulmonary: Dr Elsworth Soho  77 y.o. with history of ARDS/respiratory failure and tracheostomy in 07/2012 as well as DM, HTN, and COPD presented to the ER at [facility] with CHF in 10/2012. Patient had a prolonged hospitalization in 07/2012 with H1N1 influenza and pneumococcal PNA. This progressed to ARDS. He was intubated and ended up with tracheostomy. He had the trach removed. He also had a left empyema requiring chest tube, septic shock with elevated troponin, and AKI which resolved.  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   He was admitted again in 10/2012 from his nursing home with exertional dyspnea and orthopnea. He had gained 21 lbs. Echo at admission showed EF 35% with global hypokinesis that looked worse in the anteroseptal wall. He also was noted to have aortic stenosis rated as moderate to severe. He was diuresed and cathed, showing severe 3 vessel disease. He then had CABG-AVR (bioprosthetic valve).   He had a large AAA and underwent surgical repair in 07/2013.  He had AKI and Pseudomonas PNA post-operatively.  He was discharged to a nursing home. Echo in 1/15 showed EF 55-60% with mildly dilated and dysfunctional RV and a bioprosthetic aortic valve with mean gradient 32 mmHg (higher than expected).   Repeat limited echo in 08/2013 showed shower aortic valve mean gradient of 21 mmHg.   In 11/15, patient had a holter monitor placed for bradycardia.  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  Baseline bifascicular block.  His HR also was noted to drop into the 40s with exercise, suggesting exercise-associated heart block.  He underwent Medtronic-PCM placement in 1/16 by Dr Caryl Comes.   Echo in 10/16 showed fall in EF to 30% with mean aortic valve gradient 21 mmHg.  Repeat echo in 2/17 showed EF  20-25%, mean aortic valve gradient 24 mmHg with moderately decreased RV systolic function.  Cardiolite in 10/16 showed inferior scar with no ischemia.  He is RV pacing almost all the time.  Low dose DSE was done in 2/17 to assess for significant bioprosthetic valve stenosis => mean gradient with dobutamine was only 31 mmHg, so suspect no more than moderate stenosis.  He had St Jude CRT upgrade in 3/17.  He decided against ICD.  Repeat echo post-CRT in 8/17 showed EF 50%, diffuse hypokinesis, bioprosthetic aortic valve with mean gradient 27 mmHg.  Echo in 9/18 showed EF 50-55%, bioprosthetic aortic valve with mean gradient 25 mmHg.    Today he returns for HF follow up with his wife. Last visit torsemide was increased. Creatinine was elevated, so torsemide had to be decreased back to 40 mg am/20 mg pm. Overall doing okay. He feels the same. He gets SOB after walking longer distances on flat ground, but says back pain limits him more than SOB. He has mild BLE edema. Sleeps on a wedge at baseline. Wearing CPAP qHS and during naps. No dizziness or CP. Rides stationary bike for 30-45 minutes/day. Weights ~238 lbs at home, have gone up since restarting [place] in September. Taking all medications.   St Jude device interrogation: Thoracic impedence above threshold. Active 2-4 hours/day. No VT/VF. No AF/AT.  Labs (2/15): K 4, creatinine 1.6, BNP 166 Labs (3/15): Hgb 11.6, pro-BNP 1037, creatinine 1.76, BUN 30, LDL 75, HDL 26 Labs (8/15): K 3.9, creatinine 1.55, HCT 40.6, LDL  71, HDL 27 Labs (1/16): K 4.3, Creatinine 1.6, HCT 42.9 Labs (2/17): K 4.0 Creatinine 1.64, TSH normal.  Labs (3/17): K 4.5, creatinine 1.49, HCT 42.9 Labs (4/17): K 4, creatinine 1.48, BNP 125 Labs (6/17): LDL 27, HDL 24, TGs 245 Labs (7/17): K 4.9, creatinine 1.71 Labs (8/17): K 4.3, creatinine 1.7, LDL 64, HDl 25, TGs 198 Lab (06/16/2016): K 4.9 Creatinine 1.84  Labs (10/2016): Creatinine 1.5 K 3.9  Labs (6/18): K 4.4, creatinine  1.57, BNP 55 Labs (7/18): LDL 58, HDL 22, creatinine 1.64  1. PNA (H1N1 influenza + pneumococcus) in 5/39 complicated by respiratory failure/ARDS. Required tracheostomy, now weaned off. Had left empyema requiring chest tube.  Has developed post-infectious pulmonary fibrosis.  2. Prostate CA s/p radiation treatment. Has indwelling foley.  3. OSA: using CPAP 4. HTN  5. Hyperlipidemia  6. COPD: History of heavy smoking. PFTs (4/14) with mixed obstructive (COPD) and restrictive (post-ARDS) picture.  7. Type II diabetes  8. Anemia of chronic disease.  9. Ischemic cardiomyopathy:  - Echo (1/14) with severely dilated LV, EF 30-35%, diffuse hypokinesis worse in the anteroseptal wall, grade II diastolic dysfunction, mild MR, AS interpreted as "moderate to severe" with aortic valve mean gradient 23 mmHg.  - Echo (4/14) witih EF 35-40%, mid to apical anteroseptal akinesis, moderate to severe AS with mean gradient 33 mmHg, AVA 0.91 cm^2.  -Echo (5/14) post CABG showed EF 50%, anteroseptal hypokinesis, bioprosthetic aortic valve well-seated. - Echo (1/15) with EF 55-60%, mild LVH, mildly dilated RV with mildly decreased systolic function, D-shaped interventricular septum, bioprosthetic aortic valve with mean gardient 32 mmHg.   - Echo (3/15) with EF 55-60%, mild LVH, bioprosthetic aortic valve with mean gradient 21 mmHg (lower), no AI, RV moderately dilated with mild to moderately decreased systolic function.   - Echo (10/16) with EF 30%, bioprosthetic aortic valve with mean gradient 21 mmHg.   - Echo (2/17) with EF 20-25%, mild LVH, aortic valve mean gradient 24 mmHg/peak 39 mmHg, RV moderately dilated with moderately decreased systolic function.  - St Jude CRT upgrade in 3/17 (did not want ICD).  - Echo (8/17) with EF 50%, diffuse hypokinesis, bioprosthetic aortic valve with mean gradient 27 mmHg, normal RV size with mildly decreased systolic function.   - AKI with lisinopril.  - Echo (9/18): EF 50-55%,  bioprosthetic aortic valve with mean gradient 25 mmHg.  10. Aortic stenosis: Moderate to severe by echo in 4/14. Bioprosthetic #21 [facility]  Ease aortic valve replacement in 4/14.  Low dose DSE (2/17) showed that Mr Breeze likely has no more than moderate bioprosthetic valve stenosis with mean gradient up to 31 mmHg with dobutamine.  11. CAD: LHC (4/14) with 95% pLAD, 70-80% ostial ramus, 70% mLCx, 90% pRCA, EF 35%.  - CABG 4/14 with LIMA-LAD, seq SVG-ramus and OM1, SVG-PDA.  - Cardiolite (10/16) with EF 42%, fixed inferior defect, no ischemia.   12. Carotid stenosis: Carotid dopplers (7/67) with 34-19% LICA stenosis.  Carotids (3/79) with 02-40% LICA stenosis. Carotids (9/73) with 53-29% LICA stenosis.  - Carotid dopplers (9/24): 26-83% LICA stenosis.  - Carotid dopplers (4/19): 62-22% LICA stenosis.  13. AAA: 5/14 CT showed > 6 cm AAA, also right iliac aneurysm. Not stent graft candidate.  7/14 CT showed 7 cm AAA.  Surgical repair 1/15.  14. CKD: AKI in 7/14 from contrast nephropathy.  AKI in 1/15 after AAA repair.  15. Contrast allergy.  16. ABIs 9/14 were normal.  17. Gout 18. Heart block: Holter 11/15 with  2nd degree AV block, probably type II.  HR drops with exercise (likely exercise-induced heart block).  Medtronic dual chamber PPM placed in 2016.  Recent pacemaker interrogation showed complete heart block.  He is pacemaker-dependent, upgraded to CRT in 3/17.   SH: Quit smoking in 1/14, married, no ETOH, lives in [place].   FH: No premature CAD  Review of systems complete and found to be negative unless listed in HPI.   Current Outpatient Medications  Medication Sig Dispense Refill  . allopurinol (ZYLOPRIM) 300 MG tablet Take 300 mg by mouth daily.    Marland Kitchen aspirin EC 81 MG tablet Take 81 mg by mouth daily.    Marland Kitchen atorvastatin (LIPITOR) 80 MG tablet TAKE 1 TABLET BY MOUTH EVERY DAY BEFORE BREAKFAST 30 tablet 3  . atorvastatin (LIPITOR) 80 MG tablet TAKE 1 TABLET BY MOUTH EVERY DAY  BEFORE BREAKFAST 30 tablet 3  . bisoprolol (ZEBETA) 5 MG tablet TAKE 1/2 TABLET BY MOUTH DAILY 15 tablet 5  . calcitRIOL (ROCALTROL) 0.25 MCG capsule Take 0.25 mcg by mouth daily.     . fexofenadine (ALLEGRA) 180 MG tablet Take 90 mg by mouth daily.     Vanessa Kick Ethyl (VASCEPA) 1 g CAPS Take 2 capsules (2 g total) by mouth 2 (two) times daily. 120 capsule 5  . Insulin Degludec (TRESIBA FLEXTOUCH) 200 UNIT/ML SOPN Inject 36 Units into the skin daily. 6 pen 5  . ipratropium-albuterol (DUONEB) 0.5-2.5 (3) MG/3ML SOLN TAKE 3 MLS BY NEBULIZATION EVERY 6 (SIX) HOURS AS NEEDED. DX: J44.9 360 mL 5  . nitroGLYCERIN (NITROSTAT) 0.4 MG SL tablet PLACE 1 TABLET UNDER TONGUE EVERY 5 MINUTES AS NEEDED FOR CHEST PAIN 25 tablet 0  . pantoprazole (PROTONIX) 40 MG tablet Take 40 mg by mouth at bedtime.     . potassium chloride (K-DUR,KLOR-CON) 10 MEQ tablet Take 20 mEq by mouth daily.    . Semaglutide,0.25 or 0.5MG /DOS, (OZEMPIC, 0.25 OR 0.5 MG/DOSE,) 2 MG/1.5ML SOPN Inject 0.5 mg into the skin once a week. 2 pen 5  . torsemide (DEMADEX) 20 MG tablet Take 20 mg by mouth daily. 2 tablets in the morning and 1 tablet at night     No current facility-administered medications for this encounter.     Vitals:   05/07/18 1418  BP: 108/80  Pulse: 83  SpO2: 94%  Weight: 109.1 kg (240 lb 8 oz)    Wt Readings from Last 3 Encounters:  05/07/18 109.1 kg (240 lb 8 oz)  04/30/18 107.5 kg (237 lb)  04/02/18 108.1 kg (238 lb 4 oz)    PHYSICAL EXAM General:  No resp difficulty. Obese HEENT: Normal Neck: Supple. JVP 5-6. Carotids 2+ bilat; no bruits. No thyromegaly or nodule noted. Cor: PMI nondisplaced. RRR, 2/6 SEM RUSB with clear S2 Lungs: CTAB, normal effort. Abdomen: obese, non-tender, non-distended, no HSM. No bruits or masses. +BS  Extremities: No cyanosis, clubbing, or rash. R and LLE no edema.  Neuro: Alert & orientedx3, cranial nerves grossly intact. moves all 4 extremities w/o difficulty. Affect  pleasant  ASSESSMENT & PLAN: 1. CAD: Status post CABG for 3VD.  Cardiolite in 10/16 with inferior scar, no ischemia.  No CP.  - Continue ASA, statin.   2. Chronic systolic CHF:  Echo in 4/01 showed EF down to 20-25%.  No ischemia on Cardiolite.  Constant RV pacing may have led to decline in EF.  We considered bioprosthetic valve stenosis as cause of fall in EF, but low gradient DSE did not suggest  severe stenosis.  Echo 8/17 after CRT upgrade shows improved EF to 50%.  Echo in 9/18 with EF 50-55%.  Echo 04/2018: EF 50-55%. NYHA III.  Volume stable on exam and on corvue.  - Continue torsemide 40 mg am/20 mg pm. BMET today.  - Continue bisoprolol 2.5 mg daily.   - AKI with lisinopril, will leave off.  - Considered spiro, but SBP on low side and had recent AKI. 3. 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 Suspect that intrinsic lung disease (post-infectious pulmonary fibrosis) is a contributor to his dyspnea and hypoxemia. He is now off oxygen (just using CPAP at night).  No change.  4. Carotid stenosis: Followed at [facility], repeat dopplers 3/20. No change.  5. Hyperlipidemia: Continue statin. LDL 61 04/02/18. TG high, unable to afford lovaza or vascepa 6. AAA: s/p surgical repair. No change.  7. Status post bioprosthetic AVR: Gradient across the aortic valve has been elevated. There was some concern for possible low gradient severe bioprosthetic aortic stenosis based on past echo, but low dose DSE suggests that valvular stenosis was no more than moderate.  Mean gradient 25 mmHg across bioprosthetic valve on echo 9/18.   - He will need antibiotic prophylaxis with dental work.  - Echo 04/2018: mildly elevated mean gradient (25 mm Hg) across aortic valve, no change from previous 8. CKD: Stage III. Check BMET today.  9. 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  S/p Medtronic PCM 1/16.   Patient is pacemaker-dependent with complete heart block.  He  had upgrade to CRT in 3/17.   10. OSA: Continue CPAP nightly. No change.  11. Gout: on allopurinol.  12. COPD:  per pulmonary.  13. Type II diabetes: Followed by Dr. Cruzita Lederer  Keep follow up with Dr [name] 07/04/2018 BMET today  Georgiana Shore 05/07/2018  Greater than 50% of the 25 minute visit was spent in counseling/coordination of care regarding disease state education, salt/fluid restriction, sliding scale diuretics, and medication compliance.

## 2018-05-07 ENCOUNTER — Other Ambulatory Visit: Payer: Self-pay

## 2018-05-07 ENCOUNTER — Ambulatory Visit (HOSPITAL_COMMUNITY)
Admission: RE | Admit: 2018-05-07 | Discharge: 2018-05-07 | Disposition: A | Discharge status: Home / Self Care | Payer: Medicare Other | Source: Ambulatory Visit | Attending: Cardiology | Admitting: Cardiology

## 2018-05-07 ENCOUNTER — Encounter (HOSPITAL_COMMUNITY): Payer: Self-pay

## 2018-05-07 VITALS — BP 108/80 | HR 83 | Wt 240.5 lb

## 2018-05-07 DIAGNOSIS — N179 Acute kidney failure, unspecified: Secondary | ICD-10-CM | POA: Diagnosis not present

## 2018-05-07 DIAGNOSIS — G4733 Obstructive sleep apnea (adult) (pediatric): Secondary | ICD-10-CM

## 2018-05-07 DIAGNOSIS — J80 Acute respiratory distress syndrome: Secondary | ICD-10-CM | POA: Diagnosis not present

## 2018-05-07 DIAGNOSIS — I2510 Atherosclerotic heart disease of native coronary artery without angina pectoris: Secondary | ICD-10-CM

## 2018-05-07 DIAGNOSIS — I130 Hypertensive heart and chronic kidney disease with heart failure and stage 1 through stage 4 chronic kidney disease, or unspecified chronic kidney disease: Secondary | ICD-10-CM | POA: Insufficient documentation

## 2018-05-07 DIAGNOSIS — I6529 Occlusion and stenosis of unspecified carotid artery: Secondary | ICD-10-CM | POA: Insufficient documentation

## 2018-05-07 DIAGNOSIS — Z953 Presence of xenogenic heart valve: Secondary | ICD-10-CM | POA: Diagnosis not present

## 2018-05-07 DIAGNOSIS — I441 Atrioventricular block, second degree: Secondary | ICD-10-CM | POA: Diagnosis not present

## 2018-05-07 DIAGNOSIS — E1122 Type 2 diabetes mellitus with diabetic chronic kidney disease: Secondary | ICD-10-CM | POA: Diagnosis not present

## 2018-05-07 DIAGNOSIS — E785 Hyperlipidemia, unspecified: Secondary | ICD-10-CM | POA: Diagnosis not present

## 2018-05-07 DIAGNOSIS — Z794 Long term (current) use of insulin: Secondary | ICD-10-CM | POA: Diagnosis not present

## 2018-05-07 DIAGNOSIS — I509 Heart failure, unspecified: Secondary | ICD-10-CM

## 2018-05-07 DIAGNOSIS — M109 Gout, unspecified: Secondary | ICD-10-CM | POA: Diagnosis not present

## 2018-05-07 DIAGNOSIS — Z87891 Personal history of nicotine dependence: Secondary | ICD-10-CM | POA: Insufficient documentation

## 2018-05-07 DIAGNOSIS — I5022 Chronic systolic (congestive) heart failure: Secondary | ICD-10-CM

## 2018-05-07 DIAGNOSIS — D631 Anemia in chronic kidney disease: Secondary | ICD-10-CM | POA: Diagnosis not present

## 2018-05-07 DIAGNOSIS — Z7982 Long term (current) use of aspirin: Secondary | ICD-10-CM | POA: Insufficient documentation

## 2018-05-07 DIAGNOSIS — Z951 Presence of aortocoronary bypass graft: Secondary | ICD-10-CM | POA: Insufficient documentation

## 2018-05-07 DIAGNOSIS — J449 Chronic obstructive pulmonary disease, unspecified: Secondary | ICD-10-CM | POA: Insufficient documentation

## 2018-05-07 DIAGNOSIS — I714 Abdominal aortic aneurysm, without rupture: Secondary | ICD-10-CM | POA: Diagnosis not present

## 2018-05-07 DIAGNOSIS — Z79899 Other long term (current) drug therapy: Secondary | ICD-10-CM | POA: Diagnosis not present

## 2018-05-07 DIAGNOSIS — Z9989 Dependence on other enabling machines and devices: Secondary | ICD-10-CM

## 2018-05-07 DIAGNOSIS — N183 Chronic kidney disease, stage 3 unspecified: Secondary | ICD-10-CM

## 2018-05-07 DIAGNOSIS — Z91041 Radiographic dye allergy status: Secondary | ICD-10-CM | POA: Diagnosis not present

## 2018-05-07 DIAGNOSIS — Z950 Presence of cardiac pacemaker: Secondary | ICD-10-CM | POA: Diagnosis not present

## 2018-05-07 LAB — BASIC METABOLIC PANEL
ANION GAP: 4 — AB (ref 5–15)
BUN: 32 mg/dL — ABNORMAL HIGH (ref 8–23)
CALCIUM: 9.1 mg/dL (ref 8.9–10.3)
CO2: 32 mmol/L (ref 22–32)
Chloride: 101 mmol/L (ref 98–111)
Creatinine, Ser: 1.52 mg/dL — ABNORMAL HIGH (ref 0.61–1.24)
GFR, EST AFRICAN AMERICAN: 50 mL/min — AB (ref >60)
GFR, EST NON AFRICAN AMERICAN: 43 mL/min — AB (ref >60)
Glucose, Bld: 122 mg/dL — ABNORMAL HIGH (ref 70–99)
POTASSIUM: 3.9 mmol/L (ref 3.5–5.1)
Sodium: 137 mmol/L (ref 135–145)

## 2018-05-07 NOTE — Patient Instructions (Signed)
It was great to see you today! No medication changes are needed at this time.  Labs today We will only contact you if something comes back abnormal or we need to make some changes. Otherwise no news is good news!   PLEASE KEEP YOUR FOLLOW UP AS SCHEDULED IN 8 WEEKS WITH DR [facility]  Do the following things EVERYDAY: 1) Weigh yourself in the morning before breakfast. Write it down and keep it in a log. 2) Take your medicines as prescribed 3) Eat low salt foods-Limit salt (sodium) to 2000 mg per day.  4) Stay as active as you can everyday 5) Limit all fluids for the day to less than 2 liters

## 2018-05-20 DIAGNOSIS — E119 Type 2 diabetes mellitus without complications: Secondary | ICD-10-CM | POA: Diagnosis not present

## 2018-05-20 DIAGNOSIS — I119 Hypertensive heart disease without heart failure: Secondary | ICD-10-CM | POA: Diagnosis not present

## 2018-05-20 DIAGNOSIS — Z6839 Body mass index (BMI) 39.0-39.9, adult: Secondary | ICD-10-CM | POA: Diagnosis not present

## 2018-05-20 DIAGNOSIS — N183 Chronic kidney disease, stage 3 (moderate): Secondary | ICD-10-CM | POA: Diagnosis not present

## 2018-05-20 DIAGNOSIS — I714 Abdominal aortic aneurysm, without rupture: Secondary | ICD-10-CM | POA: Diagnosis not present

## 2018-05-20 DIAGNOSIS — C61 Malignant neoplasm of prostate: Secondary | ICD-10-CM | POA: Diagnosis not present

## 2018-05-20 DIAGNOSIS — Z0001 Encounter for general adult medical examination with abnormal findings: Secondary | ICD-10-CM | POA: Diagnosis not present

## 2018-05-20 DIAGNOSIS — I2510 Atherosclerotic heart disease of native coronary artery without angina pectoris: Secondary | ICD-10-CM | POA: Diagnosis not present

## 2018-05-23 ENCOUNTER — Ambulatory Visit (INDEPENDENT_AMBULATORY_CARE_PROVIDER_SITE_OTHER): Payer: Medicare Other | Admitting: Adult Health

## 2018-05-23 ENCOUNTER — Encounter: Payer: Self-pay | Admitting: Adult Health

## 2018-05-23 VITALS — BP 116/76 | HR 88 | Ht 65.5 in | Wt 237.8 lb

## 2018-05-23 DIAGNOSIS — J449 Chronic obstructive pulmonary disease, unspecified: Secondary | ICD-10-CM | POA: Diagnosis not present

## 2018-05-23 DIAGNOSIS — G4733 Obstructive sleep apnea (adult) (pediatric): Secondary | ICD-10-CM

## 2018-05-23 DIAGNOSIS — I6523 Occlusion and stenosis of bilateral carotid arteries: Secondary | ICD-10-CM

## 2018-05-23 DIAGNOSIS — Z87891 Personal history of nicotine dependence: Secondary | ICD-10-CM | POA: Diagnosis not present

## 2018-05-23 NOTE — Assessment & Plan Note (Signed)
Moderate to severe COPD.  Patient is encouraged to use DuoNeb 3 times a day.  Continue with pulmonary hygiene regimen. Refer for low-dose CT screening program.  Plan  Patient Instructions  Increase Duoneb Three times a day  .  Mucinex DM Twice daily  As needed  Cough/congestion .  Continue on CPAP At bedtime   Work on weight loss Do not drive if sleepy Refer for CT screening program with Anselm Lis.   Follow up Dr. Elsworth Soho  In 1 year and As needed

## 2018-05-23 NOTE — Progress Notes (Signed)
@Patient  ID: [name], male    DOB: July 22, 1940, 77 y.o.   MRN: [id number]  Chief Complaint  Patient presents with  . Follow-up    COPD     Referring provider: [name], MD  HPI: [id number] ex-heavy smoker for FU of COPD &OSA. He smoked 2-3 PPD until he quit in 07/2012  He had prolonged hospitalization in 07/2012 with H1N1 influenza and pneumococcal PNA/ ARDS requiring tracheostomy. He also had a left empyema requiring chest tube, septic shock and AKI  Medical hx significant for CABG-AVR (bioprosthetic valve). , CHF , DM   Significant tests/ events  10/23/12 PFTs reveal severe obstructive / restrictive disease &severely reduced DLCO.   PFT 01/21/13 >FEV1 58%, ratio 58, +25% change after BD , DLCO 38 .   05/23/2018 Follow up : COPD /OSA  Patient presents for a one-year follow-up.  Patient has underlying moderate to severe COPD.  He has tried several inhalers in the past but is unable to afford them.  He says he does well on the DuoNeb nebulizer.  Says for the most part he uses it through 2-3 times a day. Has noticed lately these had a little bit of increased congestion and cough.  He denies any fever, discolored mucus, chest pain orthopnea or increased leg swelling.  Patient has underlying sleep apnea.  He is on CPAP.  Says he never misses a night.  He feels rested with no significant daytime sleepiness.  Has noticed that his mask is been irritating his nose slightly.  And wonders if it is causing some of his dry eye problem.  Download shows excellent compliance with average usage around 9 hours.  AHI 2.9.    Allergies  Allergen Reactions  . Iodinated Diagnostic Agents   . Omnipaque [Iohexol] Other (See Comments)    Decreased kidney function  . Primaxin [Imipenem] Hives  . Cephalosporins Rash    Blisters  . Lisinopril     Drops Blood pressure  nausea   . Clindamycin/Lincomycin Swelling and Rash    Immunization History  Administered Date(s) Administered  .  Influenza Split 03/13/2013  . Influenza Whole 04/02/2012  . Influenza, High Dose Seasonal PF 04/02/2016, 03/06/2018  . Influenza,inj,Quad PF,6+ Mos 03/13/2015  . Influenza-Unspecified 04/16/2014  . Pneumococcal Conjugate-13 04/03/2015  . Pneumococcal-Unspecified 03/03/2008  . Zoster 07/03/2006    Past Medical History:  Diagnosis Date  . AAA (abdominal aortic aneurysm) ([place])    5/14 CT showed > 6 cm AAA, also right iliac aneurysm. Not stent graft candidate.   . Anemia of chronic disease   . Anxiety    pt. admits that he has anxiety at times   . Aortic stenosis    Moderate to severe by echo in 4/14. Bioprosthetic #21 [facility] Ease aortic valve replacement in 4/14.   . Arthritis   . CAD (coronary artery disease)    a. 4/14 CABG: LIMA-LAD, seq SVG-ramus & OM1, SVG-PDA  . Carotid arterial disease (HCC)    Carotid dopplers (8/92) with 11-94% LICA stenosis.   . CHF (congestive heart failure) ([place]) 07/2012; 10/17/2012  . Chronic kidney disease    increased creatinine recently - being followed, near kidney failure fr. contrast dye   . Chronic systolic heart failure (HCC)    a. 1/14 ECHO: sev dil LV, EF30-35%, diff HK, mild MR, AS severe, AV grad 35 b. 8/14 ECHO: EF 55-60%, mild biopros AV sten mn grad. 25, RV mild dil, RA mild dil  . Complication of anesthesia  during last surgery had to be given special medicine b/c "something dropped" during surgery   . COPD (chronic obstructive pulmonary disease) ([place])    a.  History of heavy smoking. PFTs (4/14) with mixed obstructive (COPD) and restrictive (post-ARDS) picture.   . Diabetes mellitus without complication ([place])   . Esophageal reflux   . History of blood transfusion    "lots since January" (10/17/2012)  . Hyperlipidemia   . Hypertension   . Ischemic cardiomyopathy   . Ischemic cardiomyopathy    a. 4/14 LHC: pLAD 95, ost ramus 70-80, mLCx 90, EF 30%  . OSA (obstructive sleep apnea)    on cpap- every sleep time.   . Pneumonia  07/2012   PNA (H1N1 influenza + pneumococcus) in 5/63 complicated by respiratory failure/ARDS. Required tracheostomy, now weaned off. Had left empyema requiring chest tube.   . Prediabetes   . Prostate cancer [facility])     s/p radiation treatment. - (PT. DENIES)Has indwelling foley.   . Shortness of breath    still goes deer hunting by himself    Tobacco History: Social History   Tobacco Use  Smoking Status Former Smoker  . Packs/day: 2.00  . Years: 58.00  . Pack years: 116.00  . Types: Cigarettes  . Last attempt to quit: 07/20/2012  . Years since quitting: 5.8  Smokeless Tobacco Never Used   Counseling given: Not Answered   Outpatient Medications Prior to Visit  Medication Sig Dispense Refill  . allopurinol (ZYLOPRIM) 300 MG tablet Take 300 mg by mouth daily.    Marland Kitchen aspirin EC 81 MG tablet Take 81 mg by mouth daily.    Marland Kitchen atorvastatin (LIPITOR) 80 MG tablet TAKE 1 TABLET BY MOUTH EVERY DAY BEFORE BREAKFAST 30 tablet 3  . bisoprolol (ZEBETA) 5 MG tablet TAKE 1/2 TABLET BY MOUTH DAILY 15 tablet 5  . calcitRIOL (ROCALTROL) 0.25 MCG capsule Take 0.25 mcg by mouth daily.     . fexofenadine (ALLEGRA) 180 MG tablet Take 90 mg by mouth daily.     . Insulin Degludec (TRESIBA FLEXTOUCH) 200 UNIT/ML SOPN Inject 36 Units into the skin daily. 6 pen 5  . ipratropium-albuterol (DUONEB) 0.5-2.5 (3) MG/3ML SOLN TAKE 3 MLS BY NEBULIZATION EVERY 6 (SIX) HOURS AS NEEDED. DX: J44.9 360 mL 5  . nitroGLYCERIN (NITROSTAT) 0.4 MG SL tablet PLACE 1 TABLET UNDER TONGUE EVERY 5 MINUTES AS NEEDED FOR CHEST PAIN 25 tablet 0  . pantoprazole (PROTONIX) 40 MG tablet Take 40 mg by mouth at bedtime.     . potassium chloride (K-DUR,KLOR-CON) 10 MEQ tablet 2 tabs in the morning and 1 at night    . Semaglutide,0.25 or 0.5MG /DOS, (OZEMPIC, 0.25 OR 0.5 MG/DOSE,) 2 MG/1.5ML SOPN Inject 0.5 mg into the skin once a week. 2 pen 5  . torsemide (DEMADEX) 20 MG tablet 2 tablets in the morning and 1 tablet at night     . Icosapent  Ethyl (VASCEPA) 1 g CAPS Take 2 capsules (2 g total) by mouth 2 (two) times daily. 120 capsule 5  . atorvastatin (LIPITOR) 80 MG tablet TAKE 1 TABLET BY MOUTH EVERY DAY BEFORE BREAKFAST 30 tablet 3   No facility-administered medications prior to visit.      Review of Systems  Constitutional:   No  weight loss, night sweats,  Fevers, chills, fatigue, or  lassitude.  HEENT:   No headaches,  Difficulty swallowing,  Tooth/dental problems, or  Sore throat,  No sneezing, itching, ear ache, nasal congestion, post nasal drip,   CV:  No chest pain,  Orthopnea, PND, swelling in lower extremities, anasarca, dizziness, palpitations, syncope.   GI  No heartburn, indigestion, abdominal pain, nausea, vomiting, diarrhea, change in bowel habits, loss of appetite, bloody stools.   Resp:  No chest wall deformity  Skin: no rash or lesions.  GU: no dysuria, change in color of urine, no urgency or frequency.  No flank pain, no hematuria   MS:  No joint pain or swelling.  No decreased range of motion.  No back pain.    Physical Exam  BP 116/76 (BP Location: Left Arm, Cuff Size: Normal)   Pulse 88   Ht 5' 5.5" (1.664 m)   Wt 237 lb 12.8 oz (107.9 kg)   SpO2 94%   BMI 38.97 kg/m   GEN: A/Ox3; pleasant , NAD, obese    HEENT:  [place]/AT,  EACs-clear, TMs-wnl, NOSE-clear, THROAT-clear, no lesions, no postnasal drip or exudate noted. Class 2-3 MP airway   NECK:  Supple w/ fair ROM; no JVD; normal carotid impulses w/o bruits; no thyromegaly or nodules palpated; no lymphadenopathy.    RESP  Clear  P & A; w/o, wheezes/ rales/ or rhonchi. no accessory muscle use, no dullness to percussion  CARD:  RRR, no m/r/g, no peripheral edema, pulses intact, no cyanosis or clubbing.  GI:   Soft & nt; nml bowel sounds; no organomegaly or masses detected.   Musco: Warm bil, no deformities or joint swelling noted.   Neuro: alert, no focal deficits noted.    Skin: Warm, no lesions or rashes    Lab  Results:  CBC  BNP    No flowsheet data found.  No results found for: NITRICOXIDE      Assessment & Plan:   COPD (chronic obstructive pulmonary disease) (HCC) Moderate to severe COPD.  Patient is encouraged to use DuoNeb 3 times a day.  Continue with pulmonary hygiene regimen. Refer for low-dose CT screening program.  Plan  Patient Instructions  Increase Duoneb Three times a day  .  Mucinex DM Twice daily  As needed  Cough/congestion .  Continue on CPAP At bedtime   Work on weight loss Do not drive if sleepy Refer for CT screening program with Anselm Lis.   Follow up Dr. Elsworth Soho  In 1 year and As needed         OSA (obstructive sleep apnea) Excellent control on CPAP.  With excellent compliance. Patient would like to try the DreamWear full facemask.  Sample was given  Plan  Patient Instructions  Increase Duoneb Three times a day  .  Mucinex DM Twice daily  As needed  Cough/congestion .  Continue on CPAP At bedtime   Work on weight loss Do not drive if sleepy Refer for CT screening program with Anselm Lis.   Follow up Dr. Elsworth Soho  In 1 year and As needed            Rexene Edison, NP 05/23/2018

## 2018-05-23 NOTE — Patient Instructions (Addendum)
Increase Duoneb Three times a day  .  Mucinex DM Twice daily  As needed  Cough/congestion .  Continue on CPAP At bedtime   Work on weight loss Do not drive if sleepy Refer for CT screening program with Anselm Lis.   Follow up Dr. Elsworth Soho  In 1 year and As needed

## 2018-05-23 NOTE — Assessment & Plan Note (Signed)
Excellent control on CPAP.  With excellent compliance. Patient would like to try the DreamWear full facemask.  Sample was given  Plan  Patient Instructions  Increase Duoneb Three times a day  .  Mucinex DM Twice daily  As needed  Cough/congestion .  Continue on CPAP At bedtime   Work on weight loss Do not drive if sleepy Refer for CT screening program with Anselm Lis.   Follow up Dr. Elsworth Soho  In 1 year and As needed

## 2018-05-24 ENCOUNTER — Other Ambulatory Visit: Payer: Self-pay | Admitting: Acute Care

## 2018-05-24 DIAGNOSIS — Z87891 Personal history of nicotine dependence: Secondary | ICD-10-CM

## 2018-05-24 DIAGNOSIS — Z122 Encounter for screening for malignant neoplasm of respiratory organs: Secondary | ICD-10-CM

## 2018-06-03 ENCOUNTER — Other Ambulatory Visit: Payer: Self-pay | Admitting: Cardiology

## 2018-06-14 ENCOUNTER — Encounter: Payer: Self-pay | Admitting: Internal Medicine

## 2018-06-17 ENCOUNTER — Ambulatory Visit (INDEPENDENT_AMBULATORY_CARE_PROVIDER_SITE_OTHER)
Admission: RE | Admit: 2018-06-17 | Discharge: 2018-06-17 | Disposition: A | Discharge status: Home / Self Care | Payer: Medicare Other | Source: Ambulatory Visit | Attending: Acute Care | Admitting: Acute Care

## 2018-06-17 ENCOUNTER — Encounter: Payer: Self-pay | Admitting: Acute Care

## 2018-06-17 ENCOUNTER — Other Ambulatory Visit: Payer: Self-pay

## 2018-06-17 ENCOUNTER — Ambulatory Visit (INDEPENDENT_AMBULATORY_CARE_PROVIDER_SITE_OTHER): Payer: Medicare Other | Admitting: Acute Care

## 2018-06-17 VITALS — BP 132/82 | HR 106 | Ht 65.5 in | Wt 235.0 lb

## 2018-06-17 DIAGNOSIS — Z87891 Personal history of nicotine dependence: Secondary | ICD-10-CM

## 2018-06-17 DIAGNOSIS — Z122 Encounter for screening for malignant neoplasm of respiratory organs: Secondary | ICD-10-CM

## 2018-06-17 MED ORDER — INSULIN PEN NEEDLE 31G X 4 MM MISC: 1.0000 | Freq: Every day | 2 refills | Status: AC

## 2018-06-17 NOTE — Progress Notes (Signed)
Shared Decision Making Visit Lung Cancer Screening Program [id number])   Eligibility:  Age 77 y.o.  Pack Years Smoking History Calculation 116 pack year smoking history (# packs/per year x # years smoked)  Recent History of coughing up blood  no  Unexplained weight loss? no ( >Than 15 pounds within the last 6 months )  Prior History Lung / other cancer no (Diagnosis within the last 5 years already requiring surveillance chest CT Scans).  Smoking Status Former Smoker  Former Smokers: Years since quit: 5 years  Quit Date: 2014  Visit Components:  Discussion included one or more decision making aids. yes  Discussion included risk/benefits of screening. yes  Discussion included potential follow up diagnostic testing for abnormal scans. yes  Discussion included meaning and risk of over diagnosis. yes  Discussion included meaning and risk of False Positives. yes  Discussion included meaning of total radiation exposure. yes  Counseling Included:  Importance of adherence to annual lung cancer LDCT screening. yes  Impact of comorbidities on ability to participate in the program. yes  Ability and willingness to under diagnostic treatment. yes  Smoking Cessation Counseling:  Current Smokers:   Discussed importance of smoking cessation. yes  Information about tobacco cessation classes and interventions provided to patient. yes  Patient provided with "ticket" for LDCT Scan. yes  Symptomatic Patient. no  Counseling  Diagnosis Code: Tobacco Use Z72.0  Asymptomatic Patient yes  Counseling (Intermediate counseling: > three minutes counseling) P3825  Former Smokers:   Discussed the importance of maintaining cigarette abstinence. yes  Diagnosis Code: Personal History of Nicotine Dependence. K53.976  Information about tobacco cessation classes and interventions provided to patient. Yes  Patient provided with "ticket" for LDCT Scan. yes  Written Order for Lung Cancer  Screening with LDCT placed in Epic. Yes (CT Chest Lung Cancer Screening Low Dose W/O CM) [id number] Z12.2-Screening of respiratory organs Z87.891-Personal history of nicotine dependence  I spent 25 minutes of face to face time with Mr.Santini discussing the risks and benefits of lung cancer screening. We viewed a power point together that explained in detail the above noted topics. We took the time to pause the power point at intervals to allow for questions to be asked and answered to ensure understanding. We discussed that he had taken the single most powerful action possible to decrease his risk of developing lung cancer when he quit smoking. I counseled him to remain smoke free, and to contact me if he ever had the desire to smoke again so that I can provide resources and tools to help support the effort to remain smoke free. We discussed the time and location of the scan, and that either  Doroteo Glassman RN or I will call with the results within  24-48 hours of receiving them. He has my card and contact information in the event he needs to speak with me.  He verbalized understanding of all of the above and had no further questions upon leaving the office.     I explained to the patient that there has been a high incidence of coronary artery disease noted on these exams. I explained that this is a non-gated exam therefore degree or severity cannot be determined. This patient is currently on statin therapy. I have asked the patient to follow-up with their PCP regarding any incidental finding of coronary artery disease and management with diet or medication as they feel is clinically indicated. The patient verbalized understanding of the above and had no further questions.  Magdalen Spatz, AGACNP-BC  [facility]   06/17/2018 3:45 PM

## 2018-06-21 ENCOUNTER — Other Ambulatory Visit: Payer: Self-pay | Admitting: Acute Care

## 2018-06-21 DIAGNOSIS — Z87891 Personal history of nicotine dependence: Secondary | ICD-10-CM

## 2018-06-21 DIAGNOSIS — Z122 Encounter for screening for malignant neoplasm of respiratory organs: Secondary | ICD-10-CM

## 2018-06-22 LAB — CUP PACEART REMOTE DEVICE CHECK
Battery Remaining Longevity: 96 mo
Battery Remaining Percentage: 95.5 %
Battery Voltage: 2.96 V
Brady Statistic RA Percent Paced: 1 %
Implantable Lead Implant Date: 20160113
Implantable Lead Implant Date: 20170322
Implantable Lead Location: 753858
Implantable Lead Location: 753859
Implantable Lead Location: 753860
Implantable Lead Model: 5076
Implantable Lead Model: 5076
Lead Channel Impedance Value: 410 Ohm
Lead Channel Impedance Value: 650 Ohm
Lead Channel Pacing Threshold Amplitude: 0.5 V
Lead Channel Pacing Threshold Pulse Width: 0.4 ms
Lead Channel Sensing Intrinsic Amplitude: 5.9 mV
Lead Channel Setting Pacing Amplitude: 2 V
Lead Channel Setting Pacing Amplitude: 2.5 V
Lead Channel Setting Pacing Pulse Width: 0.4 ms
Lead Channel Setting Pacing Pulse Width: 0.4 ms
MDC IDC LEAD IMPLANT DT: 20160113
MDC IDC MSMT LEADCHNL LV PACING THRESHOLD AMPLITUDE: 0.875 V
MDC IDC MSMT LEADCHNL LV PACING THRESHOLD PULSEWIDTH: 0.4 ms
MDC IDC MSMT LEADCHNL RA SENSING INTR AMPL: 3.2 mV
MDC IDC MSMT LEADCHNL RV IMPEDANCE VALUE: 510 Ohm
MDC IDC MSMT LEADCHNL RV PACING THRESHOLD AMPLITUDE: 0.5 V
MDC IDC MSMT LEADCHNL RV PACING THRESHOLD PULSEWIDTH: 0.4 ms
MDC IDC PG IMPLANT DT: 20170322
MDC IDC SESS DTM: 20191016125935
MDC IDC SET LEADCHNL RA PACING AMPLITUDE: 1.5 V
MDC IDC SET LEADCHNL RV SENSING SENSITIVITY: 5 mV
MDC IDC STAT BRADY AP VP PERCENT: 1.1 %
MDC IDC STAT BRADY AP VS PERCENT: 1 %
MDC IDC STAT BRADY AS VP PERCENT: 97 %
MDC IDC STAT BRADY AS VS PERCENT: 1 %
Pulse Gen Serial Number: 7838612

## 2018-07-04 ENCOUNTER — Ambulatory Visit (HOSPITAL_COMMUNITY)
Admission: RE | Admit: 2018-07-04 | Discharge: 2018-07-04 | Disposition: A | Discharge status: Home / Self Care | Payer: Medicare Other | Source: Ambulatory Visit | Attending: Cardiology | Admitting: Cardiology

## 2018-07-04 ENCOUNTER — Encounter (HOSPITAL_COMMUNITY): Payer: Self-pay | Admitting: Cardiology

## 2018-07-04 VITALS — BP 130/82 | HR 87 | Wt 235.8 lb

## 2018-07-04 DIAGNOSIS — Z79899 Other long term (current) drug therapy: Secondary | ICD-10-CM | POA: Diagnosis not present

## 2018-07-04 DIAGNOSIS — E785 Hyperlipidemia, unspecified: Secondary | ICD-10-CM | POA: Diagnosis not present

## 2018-07-04 DIAGNOSIS — J8410 Pulmonary fibrosis, unspecified: Secondary | ICD-10-CM | POA: Insufficient documentation

## 2018-07-04 DIAGNOSIS — N183 Chronic kidney disease, stage 3 unspecified: Secondary | ICD-10-CM

## 2018-07-04 DIAGNOSIS — I441 Atrioventricular block, second degree: Secondary | ICD-10-CM | POA: Diagnosis not present

## 2018-07-04 DIAGNOSIS — Z7982 Long term (current) use of aspirin: Secondary | ICD-10-CM | POA: Diagnosis not present

## 2018-07-04 DIAGNOSIS — Z794 Long term (current) use of insulin: Secondary | ICD-10-CM | POA: Insufficient documentation

## 2018-07-04 DIAGNOSIS — Z950 Presence of cardiac pacemaker: Secondary | ICD-10-CM | POA: Diagnosis not present

## 2018-07-04 DIAGNOSIS — J439 Emphysema, unspecified: Secondary | ICD-10-CM | POA: Diagnosis not present

## 2018-07-04 DIAGNOSIS — Z923 Personal history of irradiation: Secondary | ICD-10-CM | POA: Insufficient documentation

## 2018-07-04 DIAGNOSIS — Z951 Presence of aortocoronary bypass graft: Secondary | ICD-10-CM | POA: Diagnosis not present

## 2018-07-04 DIAGNOSIS — G4733 Obstructive sleep apnea (adult) (pediatric): Secondary | ICD-10-CM | POA: Insufficient documentation

## 2018-07-04 DIAGNOSIS — Z953 Presence of xenogenic heart valve: Secondary | ICD-10-CM | POA: Insufficient documentation

## 2018-07-04 DIAGNOSIS — I5022 Chronic systolic (congestive) heart failure: Secondary | ICD-10-CM

## 2018-07-04 DIAGNOSIS — Z91041 Radiographic dye allergy status: Secondary | ICD-10-CM | POA: Diagnosis not present

## 2018-07-04 DIAGNOSIS — I2510 Atherosclerotic heart disease of native coronary artery without angina pectoris: Secondary | ICD-10-CM | POA: Diagnosis not present

## 2018-07-04 DIAGNOSIS — J449 Chronic obstructive pulmonary disease, unspecified: Secondary | ICD-10-CM | POA: Diagnosis not present

## 2018-07-04 DIAGNOSIS — M109 Gout, unspecified: Secondary | ICD-10-CM | POA: Diagnosis not present

## 2018-07-04 DIAGNOSIS — Z87891 Personal history of nicotine dependence: Secondary | ICD-10-CM | POA: Insufficient documentation

## 2018-07-04 DIAGNOSIS — I130 Hypertensive heart and chronic kidney disease with heart failure and stage 1 through stage 4 chronic kidney disease, or unspecified chronic kidney disease: Secondary | ICD-10-CM | POA: Diagnosis not present

## 2018-07-04 DIAGNOSIS — Z8546 Personal history of malignant neoplasm of prostate: Secondary | ICD-10-CM | POA: Diagnosis not present

## 2018-07-04 DIAGNOSIS — R0602 Shortness of breath: Secondary | ICD-10-CM | POA: Diagnosis not present

## 2018-07-04 DIAGNOSIS — E1122 Type 2 diabetes mellitus with diabetic chronic kidney disease: Secondary | ICD-10-CM | POA: Diagnosis not present

## 2018-07-04 LAB — BASIC METABOLIC PANEL
ANION GAP: 10 (ref 5–15)
BUN: 26 mg/dL — ABNORMAL HIGH (ref 8–23)
CALCIUM: 9.4 mg/dL (ref 8.9–10.3)
CO2: 26 mmol/L (ref 22–32)
CREATININE: 1.57 mg/dL — AB (ref 0.61–1.24)
Chloride: 104 mmol/L (ref 98–111)
GFR calc non Af Amer: 42 mL/min — ABNORMAL LOW (ref >60)
GFR, EST AFRICAN AMERICAN: 49 mL/min — AB (ref >60)
Glucose, Bld: 128 mg/dL — ABNORMAL HIGH (ref 70–99)
Potassium: 3.9 mmol/L (ref 3.5–5.1)
SODIUM: 140 mmol/L (ref 135–145)

## 2018-07-04 LAB — LIPID PANEL
Cholesterol: 99 mg/dL (ref 0–200)
HDL: 20 mg/dL — AB (ref >40)
LDL CALC: 56 mg/dL (ref 0–99)
TRIGLYCERIDES: 114 mg/dL (ref <150)
Total CHOL/HDL Ratio: 5 RATIO
VLDL: 23 mg/dL (ref 0–40)

## 2018-07-04 MED ORDER — DOXYCYCLINE HYCLATE 100 MG PO CAPS: 100.0000 mg | ORAL_CAPSULE | Freq: Two times a day (BID) | ORAL | 0 refills | Status: AC

## 2018-07-04 MED ORDER — TORSEMIDE 20 MG PO TABS: 40.0000 mg | ORAL_TABLET | Freq: Two times a day (BID) | ORAL | 6 refills | Status: DC

## 2018-07-04 NOTE — Progress Notes (Signed)
Advanced Heart Failure Note   Patient ID: [name], male   DOB: 09-27-1940, 78 y.o.   MRN: [id number] PCP: Dr. [name] Nephrologist: Dr Deterding Vascular: Dr Kellie Simmering HF: [name] Pulmonary: Dr Elsworth Soho  78 y.o. with history of ARDS/respiratory failure and tracheostomy in 07/2012 as well as DM, HTN, and COPD presented to the ER at [facility] with CHF in 10/2012. Patient had a prolonged hospitalization in 07/2012 with H1N1 influenza and pneumococcal PNA. This progressed to ARDS. He was intubated and ended up with tracheostomy. He had the trach removed. He also had a left empyema requiring chest tube, septic shock with elevated troponin, and AKI which resolved.  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   He was admitted again in 10/2012 from his nursing home with exertional dyspnea and orthopnea. He had gained 21 lbs. Echo at admission showed EF 35% with global hypokinesis that looked worse in the anteroseptal wall. He also was noted to have aortic stenosis rated as moderate to severe. He was diuresed and cathed, showing severe 3 vessel disease. He then had CABG-AVR (bioprosthetic valve).   He had a large AAA and underwent surgical repair in 07/2013.  He had AKI and Pseudomonas PNA post-operatively.  He was discharged to a nursing home. Echo in 1/15 showed EF 55-60% with mildly dilated and dysfunctional RV and a bioprosthetic aortic valve with mean gradient 32 mmHg (higher than expected).   Repeat limited echo in 08/2013 showed shower aortic valve mean gradient of 21 mmHg.   In 11/15, patient had a holter monitor placed for bradycardia.  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  Baseline bifascicular block.  His HR also was noted to drop into the 40s with exercise, suggesting exercise-associated heart block.  He underwent Medtronic-PCM placement in 1/16 by Dr Caryl Comes.   Echo in 10/16 showed fall in EF to 30% with mean aortic valve gradient 21 mmHg.  Repeat echo in 2/17 showed EF  20-25%, mean aortic valve gradient 24 mmHg with moderately decreased RV systolic function.  Cardiolite in 10/16 showed inferior scar with no ischemia.  He is RV pacing almost all the time.  Low dose DSE was done in 2/17 to assess for significant bioprosthetic valve stenosis => mean gradient with dobutamine was only 31 mmHg, so suspect no more than moderate stenosis.  He had St Jude CRT upgrade in 3/17.  He decided against ICD.  Repeat echo post-CRT in 8/17 showed EF 50%, diffuse hypokinesis, bioprosthetic aortic valve with mean gradient 27 mmHg.  Echo in 9/18 showed EF 50-55%, bioprosthetic aortic valve with mean gradient 25 mmHg.  Echo in 10/19 showed EF 50-55%, bioprosthetic aortic valve with mean gradient 25 mmHg.   Today he returns for followup of CHF.  Weight is down 5 lbs.  He has had cough and wheezing recently, concerning for COPD exacerbation.  No chest pain.  No dyspnea walking on flat ground.  No orthopnea/PND.     St Jude device interrogation: 99% BiV pacing, impedance decreased suggestive of volume overload.   Labs (2/15): K 4, creatinine 1.6, BNP 166 Labs (3/15): Hgb 11.6, pro-BNP 1037, creatinine 1.76, BUN 30, LDL 75, HDL 26 Labs (8/15): K 3.9, creatinine 1.55, HCT 40.6, LDL 71, HDL 27 Labs (1/16): K 4.3, Creatinine 1.6, HCT 42.9 Labs (2/17): K 4.0 Creatinine 1.64, TSH normal.  Labs (3/17): K 4.5, creatinine 1.49, HCT 42.9 Labs (4/17): K 4, creatinine 1.48, BNP 125 Labs (6/17): LDL 27, HDL 24, TGs 245 Labs (7/17):  K 4.9, creatinine 1.71 Labs (8/17): K 4.3, creatinine 1.7, LDL 64, HDl 25, TGs 198 Lab (06/16/2016): K 4.9 Creatinine 1.84  Labs (10/2016): Creatinine 1.5 K 3.9  Labs (6/18): K 4.4, creatinine 1.57, BNP 55 Labs (7/18): LDL 58, HDL 22, creatinine 1.64 Labs (11/19): K 3.9, creatinine 1.5  1. PNA (H1N1 influenza + pneumococcus) in 0/98 complicated by respiratory failure/ARDS. Required tracheostomy, now weaned off. Had left empyema requiring chest tube.  Has developed  post-infectious pulmonary fibrosis.  2. Prostate CA s/p radiation treatment. Has indwelling foley.  3. OSA: using CPAP 4. HTN  5. Hyperlipidemia  6. COPD: History of heavy smoking. PFTs (4/14) with mixed obstructive (COPD) and restrictive (post-ARDS) picture.  - CT chest 12/19 consistent with emphysema.  7. Type II diabetes  8. Anemia of chronic disease.  9. Ischemic cardiomyopathy:  - Echo (1/14) with severely dilated LV, EF 30-35%, diffuse hypokinesis worse in the anteroseptal wall, grade II diastolic dysfunction, mild Vincent, AS interpreted as "moderate to severe" with aortic valve mean gradient 23 mmHg.  - Echo (4/14) witih EF 35-40%, mid to apical anteroseptal akinesis, moderate to severe AS with mean gradient 33 mmHg, AVA 0.91 cm^2.  -Echo (5/14) post CABG showed EF 50%, anteroseptal hypokinesis, bioprosthetic aortic valve well-seated. - Echo (1/15) with EF 55-60%, mild LVH, mildly dilated RV with mildly decreased systolic function, D-shaped interventricular septum, bioprosthetic aortic valve with mean gardient 32 mmHg.   - Echo (3/15) with EF 55-60%, mild LVH, bioprosthetic aortic valve with mean gradient 21 mmHg (lower), no AI, RV moderately dilated with mild to moderately decreased systolic function.   - Echo (10/16) with EF 30%, bioprosthetic aortic valve with mean gradient 21 mmHg.   - Echo (2/17) with EF 20-25%, mild LVH, aortic valve mean gradient 24 mmHg/peak 39 mmHg, RV moderately dilated with moderately decreased systolic function.  - St Jude CRT upgrade in 3/17 (did not want ICD).  - Echo (8/17) with EF 50%, diffuse hypokinesis, bioprosthetic aortic valve with mean gradient 27 mmHg, normal RV size with mildly decreased systolic function.   - AKI with lisinopril.  - Echo (9/18): EF 50-55%, bioprosthetic aortic valve with mean gradient 25 mmHg.  - Echo (10/19): EF 50-55%, bioprosthetic aortic valve with mean gradient 25 mmHg, normal RV size and systolic function.  10. Aortic stenosis:  Moderate to severe by echo in 4/14. Bioprosthetic #[facility] Ease aortic valve replacement in 4/14.  Low dose DSE (2/17) showed that [name] likely has no more than moderate bioprosthetic valve stenosis with mean gradient up to 31 mmHg with dobutamine.  11. CAD: LHC (4/14) with 95% pLAD, 70-80% ostial ramus, 70% mLCx, 90% pRCA, EF 35%.  - CABG 4/14 with LIMA-LAD, seq SVG-ramus and OM1, SVG-PDA.  - Cardiolite (10/16) with EF 42%, fixed inferior defect, no ischemia.   12. Carotid stenosis: Carotid dopplers (1/19) with 14-78% LICA stenosis.  Carotids (2/95) with 62-13% LICA stenosis. Carotids (0/86) with 57-84% LICA stenosis.  - Carotid dopplers (6/96): 29-52% LICA stenosis.  - Carotid dopplers (8/41): 32-44% LICA stenosis.  13. AAA: 5/14 CT showed > 6 cm AAA, also right iliac aneurysm. Not stent graft candidate.  7/14 CT showed 7 cm AAA.  Surgical repair 1/15.  14. CKD: AKI in 7/14 from contrast nephropathy.  AKI in 1/15 after AAA repair.  15. Contrast allergy.  16. ABIs 9/14 were normal.  17. Gout 18. 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  HR drops with exercise (  likely exercise-induced heart block).  Medtronic dual chamber PPM placed in 2016.  Recent pacemaker interrogation showed complete heart block.  He is pacemaker-dependent, upgraded to CRT in 3/17.   SH: Quit smoking in 1/14, married, no ETOH, lives in [place].   FH: No premature CAD  ROS: All systems reviewed and negative except as per HPI.    Current Outpatient Medications  Medication Sig Dispense Refill  . allopurinol (ZYLOPRIM) 300 MG tablet Take 300 mg by mouth daily.    Marland Kitchen aspirin EC 81 MG tablet Take 81 mg by mouth daily.    Marland Kitchen atorvastatin (LIPITOR) 80 MG tablet TAKE 1 TABLET BY MOUTH EVERY DAY BEFORE BREAKFAST 90 tablet 1  . bisoprolol (ZEBETA) 5 MG tablet TAKE 1/2 TABLET BY MOUTH DAILY 15 tablet 5  . calcitRIOL (ROCALTROL) 0.25 MCG capsule Take 0.25 mcg by mouth daily.     .  fexofenadine (ALLEGRA) 180 MG tablet Take 90 mg by mouth daily.     . Insulin Degludec (TRESIBA FLEXTOUCH) 200 UNIT/ML SOPN Inject 36 Units into the skin daily. 6 pen 5  . Insulin Pen Needle 31G X 4 MM MISC 1 each by Does not apply route daily. Use to inject insulin daily 100 each 2  . ipratropium-albuterol (DUONEB) 0.5-2.5 (3) MG/3ML SOLN TAKE 3 MLS BY NEBULIZATION EVERY 6 (SIX) HOURS AS NEEDED. DX: J44.9 360 mL 5  . Omega-3 Fatty Acids (FISH OIL MAXIMUM STRENGTH PO) Take by mouth. 2 in the am 1 in the pm    . pantoprazole (PROTONIX) 40 MG tablet Take 40 mg by mouth at bedtime.     . potassium chloride (K-DUR,KLOR-CON) 10 MEQ tablet 2 tabs in the morning    . Semaglutide,0.25 or 0.5MG /DOS, (OZEMPIC, 0.25 OR 0.5 MG/DOSE,) 2 MG/1.5ML SOPN Inject 0.5 mg into the skin once a week. 2 pen 5  . torsemide (DEMADEX) 20 MG tablet Take 2 tablets (40 mg total) by mouth 2 (two) times daily. 120 tablet 6  . doxycycline (VIBRAMYCIN) 100 MG capsule Take 1 capsule (100 mg total) by mouth 2 (two) times daily for 7 days. 14 capsule 0  . nitroGLYCERIN (NITROSTAT) 0.4 MG SL tablet PLACE 1 TABLET UNDER TONGUE EVERY 5 MINUTES AS NEEDED FOR CHEST PAIN (Patient not taking: Reported on 07/04/2018) 25 tablet 0   No current facility-administered medications for this encounter.     Vitals:   07/04/18 1037  BP: 130/82  Pulse: 87  SpO2: 97%  Weight: 107 kg (235 lb 12.8 oz)    Wt Readings from Last 3 Encounters:  07/04/18 107 kg (235 lb 12.8 oz)  06/17/18 106.6 kg (235 lb)  05/23/18 107.9 kg (237 lb 12.8 oz)    PHYSICAL EXAM General: NAD Neck: Thick, no JVD, no thyromegaly or thyroid nodule.  Lungs: Occasional rhonchi CV: Nondisplaced PMI.  Heart regular S1/S2, no S3/S4, no murmur.  No peripheral edema.  No carotid bruit.  Normal pedal pulses.  Abdomen: Soft, nontender, no hepatosplenomegaly, no distention.  Skin: Intact without lesions or rashes.  Neurologic: Alert and oriented x 3.  Psych: Normal  affect. Extremities: No clubbing or cyanosis.  HEENT: Normal.   ASSESSMENT & PLAN: 1. CAD: Status post CABG for 3VD.  Cardiolite in 10/16 with inferior scar, no ischemia.  No chest pain.  - Continue ASA, statin.   2. Chronic systolic CHF:  Echo in 9/62 showed EF down to 20-25%.  No ischemia on Cardiolite.  Constant RV pacing may have led to decline in EF.  We considered bioprosthetic valve stenosis as cause of fall in EF, but low gradient DSE did not suggest severe stenosis.  Echo 8/17 after CRT upgrade shows improved EF to 50%.  Last echo in 10/19 with EF 50-55%.  By [employer], he is volume overloaded though exam is not impressive.   - Continue bisoprolol 2.5 mg daily.   - AKI with lisinopril, will leave off.  - Increase torsemide to 40 mg bid.  BMET today and again in 10 days.  3. 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 Suspect that intrinsic lung disease (post-infectious pulmonary fibrosis) is a contributor to his dyspnea and hypoxemia. He is now off oxygen (just using CPAP at night).  He had emphysema noted on 12/19 CT.  Suspect COPD exacerbation over the last week or so.  - Start doxycyline 100 mg po bid.  - CXR PA/lateral to rule out PNA.  4. Carotid stenosis: Followed at [facility], repeat dopplers 3/20. 5. Hyperlipidemia: Check lipids today. .  6. AAA: s/p surgical repair. 7. Status post bioprosthetic AVR: Gradient across the aortic valve has been elevated. There was some concern for possible low gradient severe bioprosthetic aortic stenosis based on past echo, but low dose DSE suggested that valvular stenosis was no more than moderate.  Mean gradient 25 mmHg across bioprosthetic valve on echo 10/19, this is stable compared to prior echo.    - He will need antibiotic prophylaxis with dental work.  8. CKD: Stage III. Check BMET today.  9. 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  S/p Medtronic PCM 1/16.   Patient is pacemaker-dependent  with complete heart block.  He had upgrade to CRT in 3/17.   10. OSA: He is on CPAP.  Continue CPAP nightly.  11. Gout: on allopurinol.  Followup 4 months.   Loralie Champagne 07/04/2018

## 2018-07-04 NOTE — Patient Instructions (Addendum)
Chest xray to be done today  START Doxycyline 100mg  (1 capsule) twice a day for 7 days  Labs today We will only contact you if something comes back abnormal or we need to make some changes. Otherwise no news is good news!  Your physician recommends that you schedule a follow-up appointment in: 4 months. You will get a call from our office to schedule

## 2018-07-04 NOTE — Progress Notes (Signed)
Called patient with changes to how he takes Torsemide. Per MD, Pt to take Torsemide 40mg  twice a day and repeat BMET in 1 week. Pt aware of changes and states understanding. Lab appointment made for 07/11/2018 11am.

## 2018-07-05 ENCOUNTER — Telehealth (HOSPITAL_COMMUNITY): Payer: Self-pay

## 2018-07-05 NOTE — Telephone Encounter (Signed)
Pt called with chest x-ray results

## 2018-07-08 ENCOUNTER — Other Ambulatory Visit: Payer: Self-pay | Admitting: Internal Medicine

## 2018-07-11 ENCOUNTER — Ambulatory Visit (HOSPITAL_COMMUNITY)
Admission: RE | Admit: 2018-07-11 | Discharge: 2018-07-11 | Disposition: A | Discharge status: Home / Self Care | Payer: Medicare Other | Source: Ambulatory Visit | Attending: Internal Medicine | Admitting: Internal Medicine

## 2018-07-11 DIAGNOSIS — I255 Ischemic cardiomyopathy: Secondary | ICD-10-CM | POA: Diagnosis not present

## 2018-07-11 LAB — BASIC METABOLIC PANEL
Anion gap: 9 (ref 5–15)
BUN: 32 mg/dL — ABNORMAL HIGH (ref 8–23)
CO2: 28 mmol/L (ref 22–32)
Calcium: 9.5 mg/dL (ref 8.9–10.3)
Chloride: 103 mmol/L (ref 98–111)
Creatinine, Ser: 1.64 mg/dL — ABNORMAL HIGH (ref 0.61–1.24)
GFR calc Af Amer: 46 mL/min — ABNORMAL LOW (ref >60)
GFR, EST NON AFRICAN AMERICAN: 40 mL/min — AB (ref >60)
GLUCOSE: 139 mg/dL — AB (ref 70–99)
Potassium: 3.7 mmol/L (ref 3.5–5.1)
Sodium: 140 mmol/L (ref 135–145)

## 2018-07-15 ENCOUNTER — Other Ambulatory Visit: Payer: Self-pay

## 2018-07-15 MED ORDER — SEMAGLUTIDE(0.25 OR 0.5MG/DOS) 2 MG/1.5ML ~~LOC~~ SOPN: 0.5000 mg | PEN_INJECTOR | SUBCUTANEOUS | 5 refills | Status: DC

## 2018-07-17 ENCOUNTER — Ambulatory Visit (INDEPENDENT_AMBULATORY_CARE_PROVIDER_SITE_OTHER): Payer: Medicare Other

## 2018-07-17 DIAGNOSIS — I255 Ischemic cardiomyopathy: Secondary | ICD-10-CM

## 2018-07-17 DIAGNOSIS — I5022 Chronic systolic (congestive) heart failure: Secondary | ICD-10-CM

## 2018-07-18 NOTE — Progress Notes (Signed)
Remote pacemaker transmission.   

## 2018-07-21 LAB — CUP PACEART REMOTE DEVICE CHECK
Battery Remaining Percentage: 95.5 %
Battery Voltage: 2.96 V
Brady Statistic AP VP Percent: 1 %
Brady Statistic AP VS Percent: 1 %
Brady Statistic AS VP Percent: 98 %
Brady Statistic AS VS Percent: 1 %
Date Time Interrogation Session: 20200115074122
Implantable Lead Implant Date: 20160113
Implantable Lead Implant Date: 20170322
Implantable Lead Location: 753858
Implantable Lead Location: 753859
Implantable Lead Location: 753860
Implantable Lead Model: 5076
Implantable Lead Model: 5076
Implantable Pulse Generator Implant Date: 20170322
Lead Channel Impedance Value: 430 Ohm
Lead Channel Impedance Value: 530 Ohm
Lead Channel Pacing Threshold Amplitude: 0.5 V
Lead Channel Pacing Threshold Amplitude: 0.5 V
Lead Channel Pacing Threshold Amplitude: 0.75 V
Lead Channel Pacing Threshold Pulse Width: 0.4 ms
Lead Channel Pacing Threshold Pulse Width: 0.4 ms
Lead Channel Sensing Intrinsic Amplitude: 12 mV
Lead Channel Sensing Intrinsic Amplitude: 4.1 mV
Lead Channel Setting Pacing Amplitude: 1.5 V
Lead Channel Setting Pacing Amplitude: 2 V
Lead Channel Setting Pacing Amplitude: 2.5 V
Lead Channel Setting Pacing Pulse Width: 0.4 ms
Lead Channel Setting Pacing Pulse Width: 0.4 ms
MDC IDC LEAD IMPLANT DT: 20160113
MDC IDC MSMT BATTERY REMAINING LONGEVITY: 97 mo
MDC IDC MSMT LEADCHNL LV IMPEDANCE VALUE: 680 Ohm
MDC IDC MSMT LEADCHNL RA PACING THRESHOLD PULSEWIDTH: 0.4 ms
MDC IDC PG SERIAL: 7838612
MDC IDC SET LEADCHNL RV SENSING SENSITIVITY: 5 mV
MDC IDC STAT BRADY RA PERCENT PACED: 1 %
Pulse Gen Model: 3262

## 2018-07-31 ENCOUNTER — Other Ambulatory Visit (HOSPITAL_COMMUNITY): Payer: Self-pay | Admitting: Cardiology

## 2018-08-22 ENCOUNTER — Ambulatory Visit: Payer: Medicare Other | Admitting: Internal Medicine

## 2018-08-30 ENCOUNTER — Other Ambulatory Visit: Payer: Self-pay

## 2018-08-30 ENCOUNTER — Encounter: Payer: Self-pay | Admitting: Internal Medicine

## 2018-08-30 ENCOUNTER — Ambulatory Visit (INDEPENDENT_AMBULATORY_CARE_PROVIDER_SITE_OTHER): Payer: Medicare Other | Admitting: Internal Medicine

## 2018-08-30 VITALS — BP 140/80 | HR 82 | Ht 65.0 in | Wt 235.0 lb

## 2018-08-30 DIAGNOSIS — I255 Ischemic cardiomyopathy: Secondary | ICD-10-CM

## 2018-08-30 DIAGNOSIS — E1159 Type 2 diabetes mellitus with other circulatory complications: Secondary | ICD-10-CM

## 2018-08-30 DIAGNOSIS — E1165 Type 2 diabetes mellitus with hyperglycemia: Secondary | ICD-10-CM

## 2018-08-30 LAB — POCT GLYCOSYLATED HEMOGLOBIN (HGB A1C): HEMOGLOBIN A1C: 6.6 % — AB (ref 4.0–5.6)

## 2018-08-30 MED ORDER — INSULIN DEGLUDEC 200 UNIT/ML ~~LOC~~ SOPN: 26.0000 [IU] | PEN_INJECTOR | Freq: Every day | SUBCUTANEOUS | 5 refills | Status: DC

## 2018-08-30 NOTE — Patient Instructions (Addendum)
Please continue: - Tresiba U200 26 units daily - Ozempic 0.5 mg weekly  Please return in 3-4 months with your sugar log.

## 2018-08-30 NOTE — Addendum Note (Signed)
Addended by: Cardell Peach I on: 08/30/2018 04:40 PM   Modules accepted: Orders

## 2018-08-30 NOTE — Progress Notes (Signed)
Patient ID: [name], male   DOB: [birth year], 78 y.o.   MRN: [id number]   HPI: [name] is a 78 y.o.-year-old male, referred by his cardiologist, Dr. 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  He is here with his wife who offers part of the history especially about his diet and medication regimen.  Last hemoglobin A1c was: Lab Results  Component Value Date   HGBA1C 8.3 (A) 04/30/2018   HGBA1C 5.5 10/24/2012   HGBA1C 6.4 (H) 07/24/2012   Pt is on a regimen of: - Tresiba 36 >> 26 units daily - restarted 03/06/2018 - Ozempic 0.5 mg weekly-started 04/2018 -no GI side effects He was on Januvia 50 mg daily, but this did not help >> stopped 2018. He was on Jardiance.  Pt checks his sugars once a day-per meter download: - am: 135-237, 256 >> 130-155, 178 - 2h after b'fast: n/c - before lunch: n/c >> 111-143, 148 - 2h after lunch: n/c >> 126-183 - before dinner: n/c >> 95, 133-163, 177 - 2h after dinner: n/c >> 139-165, 212 - bedtime: n/c >> 151 - nighttime: n/c Lowest sugar was 135 >> 95; it is unclear at which level he has hypoglycemia awareness Highest sugar was 256 >> 212.  Glucometer: One Touch Verio  Pt's meals are: - Breakfast: Banana, 2 clementines, different fruit in season, low-salt chips - Lunch: Meat, 2 veggies, toast, and sweet tea - Dinner: Sandwich (banana or meat, tomato), 1 Clementine - Snacks: Frequently after dinner He continues to exercise on the recumbent bike 4 to 5 days a week.  -+ CKD, last BUN/creatinine:  Lab Results  Component Value Date   BUN 32 (H) 07/11/2018   BUN 26 (H) 07/04/2018   CREATININE 1.64 (H) 07/11/2018   CREATININE 1.57 (H) 07/04/2018   -+ HL; last set of lipids: Lab Results  Component Value Date   CHOL 99 07/04/2018   HDL 20 (L) 07/04/2018   LDLCALC 56 07/04/2018   LDLDIRECT  61.5 05/15/2013   TRIG 114 07/04/2018   CHOLHDL 5.0 07/04/2018  On Lipitor 80.  His cardiologist also recommended Vascepa - samples for 6 weeks - but he could not afford this.  He is now on fish oil  - last eye exam was in 10/2017: No DR reportedly  -He denies numbness and tingling in his feet.  Pt has FH of DM in S, B, Father.  He also has HTN, OSA.  ROS: Constitutional: no weight gain/no weight loss, no fatigue, no subjective hyperthermia, no subjective hypothermia,  Eyes: no blurry vision, no xerophthalmia ENT: + sore throat, no nodules palpated in neck, + dysphagia, no odynophagia, + hoarseness Cardiovascular: no CP/no SOB/no palpitations/no leg swelling Respiratory: + cough/no SOB/no wheezing Gastrointestinal: no N/no V/no D/no C/no acid reflux Musculoskeletal: no muscle aches/no joint aches Skin: no rashes, no hair loss Neurological: no tremors/no numbness/no tingling/no dizziness  I reviewed pt's medications, allergies, PMH, social hx, family hx, and changes were documented in the history of present illness. Otherwise, unchanged from my initial visit note.  Past Medical History:  Diagnosis Date  . AAA (abdominal aortic aneurysm) ([place])    5/14 CT showed > 6 cm AAA, also right iliac aneurysm. Not stent graft candidate.   . Anemia of chronic disease   . Anxiety    pt. admits that he has anxiety at times   .  Aortic stenosis    Moderate to severe by echo in 4/14. Bioprosthetic #21 [facility] Ease aortic valve replacement in 4/14.   . Arthritis   . CAD (coronary artery disease)    a. 4/14 CABG: LIMA-LAD, seq SVG-ramus & OM1, SVG-PDA  . Carotid arterial disease (HCC)    Carotid dopplers (6/27) with 03-50% LICA stenosis.   . CHF (congestive heart failure) ([place]) 07/2012; 10/17/2012  . Chronic kidney disease    increased creatinine recently - being followed, near kidney failure fr. contrast dye   . Chronic systolic heart failure (HCC)    a. 1/14 ECHO: sev dil LV, EF30-35%,  diff HK, mild MR, AS severe, AV grad 35 b. 8/14 ECHO: EF 55-60%, mild biopros AV sten mn grad. 25, RV mild dil, RA mild dil  . Complication of anesthesia    during last surgery had to be given special medicine b/c "something dropped" during surgery   . COPD (chronic obstructive pulmonary disease) ([place])    a.  History of heavy smoking. PFTs (4/14) with mixed obstructive (COPD) and restrictive (post-ARDS) picture.   . Diabetes mellitus without complication ([place])   . Esophageal reflux   . History of blood transfusion    "lots since January" (10/17/2012)  . Hyperlipidemia   . Hypertension   . Ischemic cardiomyopathy   . Ischemic cardiomyopathy    a. 4/14 LHC: pLAD 95, ost ramus 70-80, mLCx 90, EF 30%  . OSA (obstructive sleep apnea)    on cpap- every sleep time.   . Pneumonia 07/2012   PNA (H1N1 influenza + pneumococcus) in 0/93 complicated by respiratory failure/ARDS. Required tracheostomy, now weaned off. Had left empyema requiring chest tube.   . Prediabetes   . Prostate cancer [facility])     s/p radiation treatment. - (PT. DENIES)Has indwelling foley.   . Shortness of breath    still goes deer hunting by himself   Past Surgical History:  Procedure Laterality Date  . ABDOMINAL AORTIC ANEURYSM REPAIR N/A 07/10/2013   Procedure: Resection and Graftiong of perirenal AAA; Insertion 14 x 8 Hemashield Graft Aorta to Left Common Iliac and to Right Common Femoral Artery With Ligastion of Right External and Interanl Iliac Artery;  Surgeon: Mal Misty, MD;  Location: [place];  Service: Vascular;  Laterality: N/A;  . ANAL FISSURE REPAIR  2008  . AORTIC VALVE REPLACEMENT N/A 10/25/2012   Procedure: AORTIC VALVE REPLACEMENT (AVR);  Surgeon: Melrose Nakayama, MD;  Location: [place];  Service: Open Heart Surgery;  Laterality: N/A;  . CARDIAC VALVE REPLACEMENT    . CORONARY ARTERY BYPASS GRAFT N/A 10/25/2012   Procedure: CORONARY ARTERY BYPASS GRAFTING (CABG);  Surgeon: Melrose Nakayama, MD;  Location:  [place];  Service: Open Heart Surgery;  Laterality: N/A;  times 4 using left internal mammary artery and endoscopically harvested bilateral saphenous vein   . EP IMPLANTABLE DEVICE N/A 09/22/2015   Procedure: BiV Upgrade;  Surgeon: Deboraha Sprang, MD;  Location: [place] CV LAB;  Service: Cardiovascular;  Laterality: N/A;  . INTRAOPERATIVE TRANSESOPHAGEAL ECHOCARDIOGRAM N/A 10/25/2012   Procedure: INTRAOPERATIVE TRANSESOPHAGEAL ECHOCARDIOGRAM;  Surgeon: Melrose Nakayama, MD;  Location: [place];  Service: Open Heart Surgery;  Laterality: N/A;  . LEFT AND RIGHT HEART CATHETERIZATION WITH CORONARY ANGIOGRAM N/A 10/22/2012   Procedure: LEFT AND RIGHT HEART CATHETERIZATION WITH CORONARY ANGIOGRAM;  Surgeon: Larey Dresser, MD;  Location: [facility] CATH LAB;  Service: Cardiovascular;  Laterality: N/A;  . NASAL FRACTURE SURGERY  1970's  .  PACEMAKER REVISION  09/22/2015   pacemaker upgrade   . PERMANENT PACEMAKER INSERTION N/A 07/15/2014   Procedure: PERMANENT PACEMAKER INSERTION;  Surgeon: Deboraha Sprang, MD;  Location: [facility] CATH LAB;  Service: Cardiovascular;  Laterality: N/A;  . TRACHEOSTOMY  07/2012  . TRACHEOSTOMY CLOSURE  08/2012  . TRANSURETHRAL MICROWAVE THERAPY  10/15/2012   Social History   Socioeconomic History  . Marital status: Married    Spouse name: Not on file  . Number of children:  2  . Years of education: Not on file  . Highest education level: Not on file  Occupational History  .  Retired  Scientific laboratory technician  . Financial resource strain: Not on file  . Food insecurity:    Worry: Not on file    Inability: Not on file  . Transportation needs:    Medical: Not on file    Non-medical: Not on file  Tobacco Use  . Smoking status: Former Smoker    Packs/day: 2.00    Years: 58.00    Pack years: 116.00    Types: Cigarettes    Last attempt to quit: 07/20/2012    Years since quitting: 5.7  . Smokeless tobacco: Never Used  Substance and Sexual Activity  . Alcohol use: No    Alcohol/week:   Beer once or twice a year: 2 drinks    Comment: 10/17/2012 "years since I've had a drink; never had problem with it"  . Drug use: No  . Sexual activity: Not Currently   Current Outpatient Medications on File Prior to Visit  Medication Sig Dispense Refill  . allopurinol (ZYLOPRIM) 300 MG tablet Take 300 mg by mouth daily.    Marland Kitchen aspirin EC 81 MG tablet Take 81 mg by mouth daily.    Marland Kitchen atorvastatin (LIPITOR) 80 MG tablet TAKE 1 TABLET BY MOUTH EVERY DAY BEFORE BREAKFAST 90 tablet 1  . bisoprolol (ZEBETA) 5 MG tablet TAKE 1/2 TABLET BY MOUTH DAILY 15 tablet 5  . calcitRIOL (ROCALTROL) 0.25 MCG capsule Take 0.25 mcg by mouth daily.     . fexofenadine (ALLEGRA) 180 MG tablet Take 90 mg by mouth daily.     . Insulin Degludec (TRESIBA FLEXTOUCH) 200 UNIT/ML SOPN Inject 36 Units into the skin daily. 6 pen 5  . Insulin Pen Needle 31G X 4 MM MISC 1 each by Does not apply route daily. Use to inject insulin daily 100 each 2  . ipratropium-albuterol (DUONEB) 0.5-2.5 (3) MG/3ML SOLN TAKE 3 MLS BY NEBULIZATION EVERY 6 (SIX) HOURS AS NEEDED. DX: J44.9 360 mL 5  . nitroGLYCERIN (NITROSTAT) 0.4 MG SL tablet PLACE 1 TABLET UNDER TONGUE EVERY 5 MINUTES AS NEEDED FOR CHEST PAIN (Patient not taking: Reported on 07/04/2018) 25 tablet 0  . Omega-3 Fatty Acids (FISH OIL MAXIMUM STRENGTH PO) Take by mouth. 2 in the am 1 in the pm    . pantoprazole (PROTONIX) 40 MG tablet Take 40 mg by mouth at bedtime.     . potassium chloride (K-DUR,KLOR-CON) 10 MEQ tablet 2 tabs in the morning    . Semaglutide,0.25 or 0.5MG /DOS, (OZEMPIC, 0.25 OR 0.5 MG/DOSE,) 2 MG/1.5ML SOPN Inject 0.5 mg into the skin once a week. 2 pen 5  . torsemide (DEMADEX) 20 MG tablet Take 2 tablets (40 mg total) by mouth 2 (two) times daily. 120 tablet 6   No current facility-administered medications on file prior to visit.    Allergies  Allergen Reactions  . Iodinated Diagnostic Agents   . Omnipaque [Iohexol] Other (See  Comments)    Decreased kidney  function  . Primaxin [Imipenem] Hives  . Cephalosporins Rash    Blisters  . Lisinopril     Drops Blood pressure  nausea   . Clindamycin/Lincomycin Swelling and Rash   Family History  Problem Relation Age of Onset  . Cancer Mother   . Heart disease Father   . Congestive Heart Failure Unknown   . Heart disease Unknown    Also,  hyperlipidemia, heart disease, hypertension and brother. Thyroid disease and cancer in mother.  PE: BP 140/80   Pulse 82   Ht 5\' 5"  (1.651 m)   Wt 235 lb (106.6 kg)   SpO2 94%   BMI 39.11 kg/m  Wt Readings from Last 3 Encounters:  08/30/18 235 lb (106.6 kg)  07/04/18 235 lb 12.8 oz (107 kg)  06/17/18 235 lb (106.6 kg)   Constitutional: overweight, in NAD Eyes: PERRLA, EOMI, no exophthalmos ENT: moist mucous membranes, no thyromegaly, no cervical lymphadenopathy Cardiovascular: RRR, No MRG Respiratory: CTA B Gastrointestinal: abdomen soft, NT, ND, BS+ Musculoskeletal: no deformities, strength intact in all 4 Skin: moist, warm, + violaceous rash on both forearms, with sanguinolent small bullae at the site of scratching Neurological: no tremor with outstretched hands, DTR normal in all 4  ASSESSMENT: 1. 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 HL  3.  Obesity  PLAN:  1. Patient with longstanding, uncontrolled, type 2 diabetes, on basal insulin at last visit, which was insufficient.  HbA1c was 8.3% then (higher).  At that time, he was only checking sugars in the morning and they were fluctuating between slightly above target and 250s.  He observed that sugars are higher if he was snacking after dinner.  He was snacking frequently at night and I strongly advised him to stop doing so.  He was also missing insulin injections and we discussed about trying not to miss them.  We added Ozempic at that time, since this is not  influenced by his CKD.  I would have preferred to add an SGLT 2 inhibitor due to his history of CHF, but his kidney function was too low for this. -He is tolerating Ozempic well and his sugars improved after adding this however, he mentions that [place] and Ozempic are very expensive.  He and his wife, would like to stay on it, if possible.  Today, I gave him a sample of Ozempic and [place].  We discussed about using patient assistance -they will discuss with the pharmacist -As of now, sugars appear improved compared to last visit and , HbA1c is 6.6% (better) -We did not change his regimen for now, but we did discuss about improving his diet by eliminating snacks - I suggested to:  Patient Instructions  Please continue: - Tresiba U200 26 units daily - Ozempic 0.5 mg weekly  Please return in 3-4 months with your sugar log.   - continue checking sugars at different times of the day - check 1x a day, rotating checks - advised for yearly eye exams >> he is UTD - Return to clinic in 3-4 mo with sugar log   2. HL - Reviewed latest lipid panel from 07/2018: LDL at goal, HDL low Lab Results  Component Value Date   CHOL 99 07/04/2018   HDL 20 (L) 07/04/2018   LDLCALC 56 07/04/2018   LDLDIRECT 61.5 05/15/2013  TRIG 114 07/04/2018   CHOLHDL 5.0 07/04/2018  - Continues Lipitor and fish oil without side effects.  3.  Obesity -Continue Ozempic which should also help with weight loss -He lost 2 pounds since last visit  Philemon Kingdom, MD PhD [facility] Endocrinology

## 2018-09-01 ENCOUNTER — Other Ambulatory Visit (HOSPITAL_COMMUNITY): Payer: Self-pay | Admitting: Cardiology

## 2018-09-23 DIAGNOSIS — N183 Chronic kidney disease, stage 3 (moderate): Secondary | ICD-10-CM | POA: Diagnosis not present

## 2018-09-23 DIAGNOSIS — I2510 Atherosclerotic heart disease of native coronary artery without angina pectoris: Secondary | ICD-10-CM | POA: Diagnosis not present

## 2018-09-23 DIAGNOSIS — D631 Anemia in chronic kidney disease: Secondary | ICD-10-CM | POA: Diagnosis not present

## 2018-09-23 DIAGNOSIS — M109 Gout, unspecified: Secondary | ICD-10-CM | POA: Diagnosis not present

## 2018-09-23 DIAGNOSIS — C61 Malignant neoplasm of prostate: Secondary | ICD-10-CM | POA: Diagnosis not present

## 2018-09-23 DIAGNOSIS — E782 Mixed hyperlipidemia: Secondary | ICD-10-CM | POA: Diagnosis not present

## 2018-09-23 DIAGNOSIS — Z950 Presence of cardiac pacemaker: Secondary | ICD-10-CM | POA: Diagnosis not present

## 2018-09-23 DIAGNOSIS — E1122 Type 2 diabetes mellitus with diabetic chronic kidney disease: Secondary | ICD-10-CM | POA: Diagnosis not present

## 2018-09-23 DIAGNOSIS — N2581 Secondary hyperparathyroidism of renal origin: Secondary | ICD-10-CM | POA: Diagnosis not present

## 2018-09-23 DIAGNOSIS — N390 Urinary tract infection, site not specified: Secondary | ICD-10-CM | POA: Diagnosis not present

## 2018-09-23 DIAGNOSIS — I129 Hypertensive chronic kidney disease with stage 1 through stage 4 chronic kidney disease, or unspecified chronic kidney disease: Secondary | ICD-10-CM | POA: Diagnosis not present

## 2018-09-23 DIAGNOSIS — I255 Ischemic cardiomyopathy: Secondary | ICD-10-CM | POA: Diagnosis not present

## 2018-09-27 ENCOUNTER — Ambulatory Visit: Payer: Medicare Other | Admitting: Family

## 2018-09-27 ENCOUNTER — Encounter (HOSPITAL_COMMUNITY): Payer: Medicare Other

## 2018-10-14 DIAGNOSIS — N390 Urinary tract infection, site not specified: Secondary | ICD-10-CM | POA: Diagnosis not present

## 2018-10-16 ENCOUNTER — Other Ambulatory Visit: Payer: Self-pay

## 2018-10-16 ENCOUNTER — Ambulatory Visit (INDEPENDENT_AMBULATORY_CARE_PROVIDER_SITE_OTHER): Payer: Medicare Other | Admitting: *Deleted

## 2018-10-16 DIAGNOSIS — I442 Atrioventricular block, complete: Secondary | ICD-10-CM

## 2018-10-16 DIAGNOSIS — I5022 Chronic systolic (congestive) heart failure: Secondary | ICD-10-CM

## 2018-10-16 LAB — CUP PACEART REMOTE DEVICE CHECK
Battery Remaining Longevity: 97 mo
Battery Remaining Percentage: 95.5 %
Battery Voltage: 2.96 V
Brady Statistic AP VP Percent: 1 %
Brady Statistic AP VS Percent: 1 %
Brady Statistic AS VP Percent: 98 %
Brady Statistic AS VS Percent: 1 %
Brady Statistic RA Percent Paced: 1 %
Date Time Interrogation Session: 20200415133303
Implantable Lead Implant Date: 20160113
Implantable Lead Implant Date: 20160113
Implantable Lead Implant Date: 20170322
Implantable Lead Location: 753858
Implantable Lead Location: 753859
Implantable Lead Location: 753860
Implantable Lead Model: 5076
Implantable Lead Model: 5076
Implantable Pulse Generator Implant Date: 20170322
Lead Channel Impedance Value: 430 Ohm
Lead Channel Impedance Value: 530 Ohm
Lead Channel Impedance Value: 710 Ohm
Lead Channel Pacing Threshold Amplitude: 0.5 V
Lead Channel Pacing Threshold Amplitude: 0.625 V
Lead Channel Pacing Threshold Amplitude: 0.75 V
Lead Channel Pacing Threshold Pulse Width: 0.4 ms
Lead Channel Pacing Threshold Pulse Width: 0.4 ms
Lead Channel Pacing Threshold Pulse Width: 0.4 ms
Lead Channel Sensing Intrinsic Amplitude: 12 mV
Lead Channel Sensing Intrinsic Amplitude: 3.6 mV
Lead Channel Setting Pacing Amplitude: 1.625
Lead Channel Setting Pacing Amplitude: 2 V
Lead Channel Setting Pacing Amplitude: 2.5 V
Lead Channel Setting Pacing Pulse Width: 0.4 ms
Lead Channel Setting Pacing Pulse Width: 0.4 ms
Lead Channel Setting Sensing Sensitivity: 5 mV
Pulse Gen Model: 3262
Pulse Gen Serial Number: 7838612

## 2018-10-23 NOTE — Progress Notes (Signed)
Remote pacemaker transmission.   

## 2018-10-31 ENCOUNTER — Telehealth: Payer: Self-pay

## 2018-10-31 NOTE — Telephone Encounter (Signed)

## 2018-11-01 ENCOUNTER — Encounter (HOSPITAL_COMMUNITY): Payer: Medicare Other | Admitting: Cardiology

## 2018-11-05 ENCOUNTER — Telehealth (INDEPENDENT_AMBULATORY_CARE_PROVIDER_SITE_OTHER): Payer: Medicare Other | Admitting: Internal Medicine

## 2018-11-05 VITALS — Ht 65.0 in | Wt 235.0 lb

## 2018-11-05 DIAGNOSIS — I255 Ischemic cardiomyopathy: Secondary | ICD-10-CM | POA: Diagnosis not present

## 2018-11-05 DIAGNOSIS — I442 Atrioventricular block, complete: Secondary | ICD-10-CM | POA: Diagnosis not present

## 2018-11-05 DIAGNOSIS — I5022 Chronic systolic (congestive) heart failure: Secondary | ICD-10-CM

## 2018-11-05 DIAGNOSIS — Z950 Presence of cardiac pacemaker: Secondary | ICD-10-CM

## 2018-11-05 NOTE — Progress Notes (Signed)
Electrophysiology TeleHealth Note   Due to national recommendations of social distancing due to COVID 19, an audio/video telehealth visit is felt to be most appropriate for this patient at this time.  See MyChart message from today for the patient's consent to telehealth for [facility]   Date:  11/05/2018   ID:  [name], DOB 1940-08-07, MRN [id number]  Location: patient's home  Provider location: [address], [place]  Evaluation Performed: Follow-up visit  PCP:  [name], MD  Cardiologist:     Electrophysiologist:  SK   Chief Complaint:  Complete heart block  History of Present Illness:    [name] is a 78 y.o. male who presents via audio/video conferencing for a telehealth visit today.  Since last being seen in our clinic, the patient reports doing well  The patient denies chest pain, shortness of breath, nocturnal dyspnea, orthopnea or peripheral edema.  There have been no palpitations, lightheadedness or syncope.   Not very active and his wife accuses him of grzing out of boredom   Date Cr K Hgb  1/20 1.64 3.7 14.5 (10/19)            The patient denies symptoms of fevers, chills, cough, or new SOB worrisome for COVID 19.     Past Medical History:  Diagnosis Date  . AAA (abdominal aortic aneurysm) ([place])    5/14 CT showed > 6 cm AAA, also right iliac aneurysm. Not stent graft candidate.   . Anemia of chronic disease   . Anxiety    pt. admits that he has anxiety at times   . Aortic stenosis    Moderate to severe by echo in 4/14. Bioprosthetic #21 [facility] Ease aortic valve replacement in 4/14.   . Arthritis   . CAD (coronary artery disease)    a. 4/14 CABG: LIMA-LAD, seq SVG-ramus & OM1, SVG-PDA  . Carotid arterial disease (HCC)    Carotid dopplers (8/41) with 66-06% LICA stenosis.   . CHF (congestive heart failure) ([place]) 07/2012; 10/17/2012  . Chronic kidney disease    increased creatinine recently - being followed,  near kidney failure fr. contrast dye   . Chronic systolic heart failure (HCC)    a. 1/14 ECHO: sev dil LV, EF30-35%, diff HK, mild MR, AS severe, AV grad 35 b. 8/14 ECHO: EF 55-60%, mild biopros AV sten mn grad. 25, RV mild dil, RA mild dil  . Complication of anesthesia    during last surgery had to be given special medicine b/c "something dropped" during surgery   . COPD (chronic obstructive pulmonary disease) ([place])    a.  History of heavy smoking. PFTs (4/14) with mixed obstructive (COPD) and restrictive (post-ARDS) picture.   . Diabetes mellitus without complication ([place])   . Esophageal reflux   . History of blood transfusion    "lots since January" (10/17/2012)  . Hyperlipidemia   . Hypertension   . Ischemic cardiomyopathy   . Ischemic cardiomyopathy    a. 4/14 LHC: pLAD 95, ost ramus 70-80, mLCx 90, EF 30%  . OSA (obstructive sleep apnea)    on cpap- every sleep time.   . Pneumonia 07/2012   PNA (H1N1 influenza + pneumococcus) in 3/01 complicated by respiratory failure/ARDS. Required tracheostomy, now weaned off. Had left empyema requiring chest tube.   . Prediabetes   . Prostate cancer [facility])     s/p radiation treatment. - (PT. DENIES)Has indwelling foley.   . Shortness of breath  still goes deer hunting by himself    Past Surgical History:  Procedure Laterality Date  . ABDOMINAL AORTIC ANEURYSM REPAIR N/A 07/10/2013   Procedure: Resection and Graftiong of perirenal AAA; Insertion 14 x 8 Hemashield Graft Aorta to Left Common Iliac and to Right Common Femoral Artery With Ligastion of Right External and Interanl Iliac Artery;  Surgeon: Mal Misty, MD;  Location: [place];  Service: Vascular;  Laterality: N/A;  . ANAL FISSURE REPAIR  2008  . AORTIC VALVE REPLACEMENT N/A 10/25/2012   Procedure: AORTIC VALVE REPLACEMENT (AVR);  Surgeon: Melrose Nakayama, MD;  Location: [place];  Service: Open Heart Surgery;  Laterality: N/A;  . CARDIAC VALVE REPLACEMENT    . CORONARY ARTERY BYPASS  GRAFT N/A 10/25/2012   Procedure: CORONARY ARTERY BYPASS GRAFTING (CABG);  Surgeon: Melrose Nakayama, MD;  Location: [place];  Service: Open Heart Surgery;  Laterality: N/A;  times 4 using left internal mammary artery and endoscopically harvested bilateral saphenous vein   . EP IMPLANTABLE DEVICE N/A 09/22/2015   Procedure: BiV Upgrade;  Surgeon: Deboraha Sprang, MD;  Location: [place] CV LAB;  Service: Cardiovascular;  Laterality: N/A;  . INTRAOPERATIVE TRANSESOPHAGEAL ECHOCARDIOGRAM N/A 10/25/2012   Procedure: INTRAOPERATIVE TRANSESOPHAGEAL ECHOCARDIOGRAM;  Surgeon: Melrose Nakayama, MD;  Location: [place];  Service: Open Heart Surgery;  Laterality: N/A;  . LEFT AND RIGHT HEART CATHETERIZATION WITH CORONARY ANGIOGRAM N/A 10/22/2012   Procedure: LEFT AND RIGHT HEART CATHETERIZATION WITH CORONARY ANGIOGRAM;  Surgeon: Larey Dresser, MD;  Location: [facility] CATH LAB;  Service: Cardiovascular;  Laterality: N/A;  . NASAL FRACTURE SURGERY  1970's  . PACEMAKER REVISION  09/22/2015   pacemaker upgrade   . PERMANENT PACEMAKER INSERTION N/A 07/15/2014   Procedure: PERMANENT PACEMAKER INSERTION;  Surgeon: Deboraha Sprang, MD;  Location: [facility] CATH LAB;  Service: Cardiovascular;  Laterality: N/A;  . TRACHEOSTOMY  07/2012  . TRACHEOSTOMY CLOSURE  08/2012  . TRANSURETHRAL MICROWAVE THERAPY  10/15/2012    Current Outpatient Medications  Medication Sig Dispense Refill  . allopurinol (ZYLOPRIM) 300 MG tablet Take 300 mg by mouth daily.    Marland Kitchen aspirin EC 81 MG tablet Take 81 mg by mouth daily.    Marland Kitchen atorvastatin (LIPITOR) 80 MG tablet TAKE 1 TABLET BY MOUTH EVERY DAY BEFORE BREAKFAST 90 tablet 1  . bisoprolol (ZEBETA) 5 MG tablet TAKE 1/2 TABLET BY MOUTH DAILY 45 tablet 1  . calcitRIOL (ROCALTROL) 0.25 MCG capsule Take 0.25 mcg by mouth daily.     Marland Kitchen dextromethorphan-guaiFENesin (MUCINEX DM) 30-600 MG 12hr tablet Take 1 tablet by mouth 2 (two) times daily.    . fexofenadine (ALLEGRA) 180 MG tablet Take 180 mg by mouth  daily.     . Insulin Degludec (TRESIBA FLEXTOUCH) 200 UNIT/ML SOPN Inject 26 Units into the skin daily. 6 pen 5  . Insulin Pen Needle 31G X 4 MM MISC 1 each by Does not apply route daily. Use to inject insulin daily 100 each 2  . ipratropium-albuterol (DUONEB) 0.5-2.5 (3) MG/3ML SOLN TAKE 3 MLS BY NEBULIZATION EVERY 6 (SIX) HOURS AS NEEDED. DX: J44.9 360 mL 5  . nitroGLYCERIN (NITROSTAT) 0.4 MG SL tablet PLACE 1 TABLET UNDER TONGUE EVERY 5 MINUTES AS NEEDED FOR CHEST PAIN 25 tablet 0  . Omega-3 Fatty Acids (FISH OIL MAXIMUM STRENGTH PO) Take 1 capsule by mouth daily.     . pantoprazole (PROTONIX) 40 MG tablet Take 40 mg by mouth at bedtime.     . potassium chloride (  K-DUR,KLOR-CON) 10 MEQ tablet Take 20 mEq by mouth daily.     . Semaglutide,0.25 or 0.5MG /DOS, (OZEMPIC, 0.25 OR 0.5 MG/DOSE,) 2 MG/1.5ML SOPN Inject 0.5 mg into the skin once a week. 2 pen 5  . torsemide (DEMADEX) 20 MG tablet Take 2 tablets (40 mg total) by mouth 2 (two) times daily. 120 tablet 6   No current facility-administered medications for this visit.     Allergies:   Iodinated diagnostic agents; Omnipaque [iohexol]; Primaxin [imipenem]; Cephalosporins; Lisinopril; and Clindamycin/lincomycin   Social History:  The patient  reports that he quit smoking about 6 years ago. His smoking use included cigarettes. He has a 116.00 pack-year smoking history. He has never used smokeless tobacco. He reports that he does not drink alcohol or use drugs.   Family History:  The patient's   family history includes Cancer in his mother; Congestive Heart Failure in his unknown relative; Heart disease in his father and unknown relative.   ROS:  Please see the history of present illness.   All other systems are personally reviewed and negative.    Exam:    Vital Signs:  Ht 5\' 5"  (1.651 m)   Wt 235 lb (106.6 kg)   BMI 39.11 kg/m    Well appearing, alert and conversant, regular work of breathing,  good skin color Eyes- anicteric, neuro-  grossly intact, skin- no apparent rash or lesions or cyanosis, mouth- oral mucosa is pink   Labs/Other Tests and Data Reviewed:    Recent Labs: 04/02/2018: Hemoglobin 14.5; Platelets 133 07/11/2018: BUN 32; Creatinine, Ser 1.64; Potassium 3.7; Sodium 140   Wt Readings from Last 3 Encounters:  11/05/18 235 lb (106.6 kg)  08/30/18 235 lb (106.6 kg)  07/04/18 235 lb 12.8 oz (107 kg)     Other studies personally reviewed:   Last device remote is reviewed from [facility] PDF dated 4/20 which reveals normal device function,   arrhythmias -     ASSESSMENT & PLAN:    High-grade heart block now device dependent  Obesity  Obstructive sleep apnea  Cardiomyopathy    Congestive heart failure-chronic-mixed  Sinus tachycardia  CRT Pacemaker-Medtronic The patient's device was interrogated.  The information was reviewed. No changes were made in the programming.      Ischemic heart disease with prior bypass surgery  Without symptoms of ischemia  Euvolemic continue current meds  Compliant with sleep apnea   Encouraged exercise and engagement with some aciivities either at church or community college or business associations    COVID 19 screen The patient denies symptoms of COVID 19 at this time.  The importance of social distancing was discussed today.  Follow-up:  *32 m Next remote: As Scheduled   Current medicines are reviewed at length with the patient today.   The patient does not have concerns regarding his medicines.  The following changes were made today:  none  Labs/ tests ordered today include:   No orders of the defined types were placed in this encounter.   Future tests ( post COVID )     Patient Risk:  after full review of this patients clinical status, I feel that they are at moderate risk at this time.  Today, I have spent   minutes with the patient with telehealth technology discussing the above.  Signed, Virl Axe, MD  11/05/2018 3:46 PM     [place] [address] [place] [address] [address] [phone] (office) [phone] (fax)

## 2018-11-13 ENCOUNTER — Encounter: Payer: Self-pay | Admitting: Internal Medicine

## 2018-11-15 ENCOUNTER — Other Ambulatory Visit: Payer: Self-pay | Admitting: Pulmonary Disease

## 2018-11-17 NOTE — Progress Notes (Signed)
Patient ID: LOUKAS ANTONSON, male   DOB: 1940-12-04, 78 y.o.   MRN: [id number]   HPI: SERAFINO BURCIAGA is a 78 y.o.-year-old male, referred by his cardiologist, Dr. 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  Last visit 2.5 months ago.  In last 2 weeks, sugars were a little higher.  Last hemoglobin A1c was: Lab Results  Component Value Date   HGBA1C 6.6 (A) 08/30/2018   HGBA1C 8.3 (A) 04/30/2018   HGBA1C 5.5 10/24/2012   HGBA1C 6.4 (H) 07/24/2012   Pt is on a regimen of: - Tresiba 36 >> 26 units daily - restarted 03/06/2018 - Ozempic 0.5 mg weekly-started 04/2018 -no GI side effects.  This is expensive for him, at $180, but he tells me that is not completely unaffordable.  She would prefer to have samples, no. He was on Januvia 50 mg daily, but this did not help >> stopped 2018. He was on Jardiance.  Pt checks his sugars once a day: - am: 135-237, 256 >> 130-155, 178 >> 141-188 (before and after coffee) - 2h after b'fast: n/c - before lunch: n/c >> 111-143, 148 >> 148-181 - 2h after lunch: n/c >> 126-183 >> n/c - before dinner: n/c >> 95, 133-163, 177 >> 111-168, 182 - 2h after dinner: n/c >> 139-165, 212 >> 143-210 - bedtime: n/c >> 151 - nighttime: n/c Lowest sugar was 135 >> 95 >> 111; it is unclear at which level he has hypoglycemia awareness. Highest sugar was 256 >> 212 >> 210.  Glucometer: One Touch Verio  Pt's meals are: - Breakfast: Banana, 2 clementines, different fruit in season, low-salt chips - Lunch: Meat, 2 veggies, toast, and sweet tea - Dinner: Sandwich (banana or meat, tomato), 1 Clementine - Snacks: Frequently after dinner He continues to exercise on the recumbent bike 4-5 times a week.  -+ CKD, last BUN/creatinine:  Lab Results  Component Value Date   BUN 32 (H) 07/11/2018   BUN 26 (H) 07/04/2018   CREATININE  1.64 (H) 07/11/2018   CREATININE 1.57 (H) 07/04/2018   -+ HL; last set of lipids: Lab Results  Component Value Date   CHOL 99 07/04/2018   HDL 20 (L) 07/04/2018   LDLCALC 56 07/04/2018   LDLDIRECT 61.5 05/15/2013   TRIG 114 07/04/2018   CHOLHDL 5.0 07/04/2018  On Lipitor 80.  His cardiologist also recommended Vascepa - samples for 6 weeks -but he could not afford this.  He is on fish oil.  - last eye exam was in 10/2017: No DR reportedly  -No numbness and tingling in his feet.  Pt has FH of DM in S, B, Father.  He also has HTN, OSA.  ROS: Constitutional: no weight gain/no weight loss, no fatigue, no subjective hyperthermia, no subjective hypothermia Eyes: no blurry vision, no xerophthalmia ENT: no sore throat, no nodules palpated in neck, no dysphagia, no odynophagia, no hoarseness Cardiovascular: no CP/no SOB/no palpitations/no leg swelling Respiratory: no cough/no SOB/no wheezing Gastrointestinal: no N/no V/no D/no C/no acid reflux Musculoskeletal: no muscle aches/no joint aches Skin: no rashes, no hair loss Neurological: no tremors/no numbness/no tingling/no dizziness  I reviewed pt's medications, allergies, PMH, social hx, family hx, and changes were documented in the history of present illness. Otherwise, unchanged from my initial visit note.  Past Medical History:  Diagnosis Date  . AAA (abdominal aortic aneurysm) ([place])  5/14 CT showed > 6 cm AAA, also right iliac aneurysm. Not stent graft candidate.   . Anemia of chronic disease   . Anxiety    pt. admits that he has anxiety at times   . Aortic stenosis    Moderate to severe by echo in 4/14. Bioprosthetic #21 [facility] Ease aortic valve replacement in 4/14.   . Arthritis   . CAD (coronary artery disease)    a. 4/14 CABG: LIMA-LAD, seq SVG-ramus & OM1, SVG-PDA  . Carotid arterial disease (HCC)    Carotid dopplers (9/45) with 03-88% LICA stenosis.   . CHF (congestive heart failure) ([place]) 07/2012; 10/17/2012   . Chronic kidney disease    increased creatinine recently - being followed, near kidney failure fr. contrast dye   . Chronic systolic heart failure (HCC)    a. 1/14 ECHO: sev dil LV, EF30-35%, diff HK, mild MR, AS severe, AV grad 35 b. 8/14 ECHO: EF 55-60%, mild biopros AV sten mn grad. 25, RV mild dil, RA mild dil  . Complication of anesthesia    during last surgery had to be given special medicine b/c "something dropped" during surgery   . COPD (chronic obstructive pulmonary disease) ([place])    a.  History of heavy smoking. PFTs (4/14) with mixed obstructive (COPD) and restrictive (post-ARDS) picture.   . Diabetes mellitus without complication ([place])   . Esophageal reflux   . History of blood transfusion    "lots since January" (10/17/2012)  . Hyperlipidemia   . Hypertension   . Ischemic cardiomyopathy   . Ischemic cardiomyopathy    a. 4/14 LHC: pLAD 95, ost ramus 70-80, mLCx 90, EF 30%  . OSA (obstructive sleep apnea)    on cpap- every sleep time.   . Pneumonia 07/2012   PNA (H1N1 influenza + pneumococcus) in 8/28 complicated by respiratory failure/ARDS. Required tracheostomy, now weaned off. Had left empyema requiring chest tube.   . Prediabetes   . Prostate cancer [facility])     s/p radiation treatment. - (PT. DENIES)Has indwelling foley.   . Shortness of breath    still goes deer hunting by himself   Past Surgical History:  Procedure Laterality Date  . ABDOMINAL AORTIC ANEURYSM REPAIR N/A 07/10/2013   Procedure: Resection and Graftiong of perirenal AAA; Insertion 14 x 8 Hemashield Graft Aorta to Left Common Iliac and to Right Common Femoral Artery With Ligastion of Right External and Interanl Iliac Artery;  Surgeon: Mal Misty, MD;  Location: [place];  Service: Vascular;  Laterality: N/A;  . ANAL FISSURE REPAIR  2008  . AORTIC VALVE REPLACEMENT N/A 10/25/2012   Procedure: AORTIC VALVE REPLACEMENT (AVR);  Surgeon: Melrose Nakayama, MD;  Location: [place];  Service: Open Heart Surgery;   Laterality: N/A;  . CARDIAC VALVE REPLACEMENT    . CORONARY ARTERY BYPASS GRAFT N/A 10/25/2012   Procedure: CORONARY ARTERY BYPASS GRAFTING (CABG);  Surgeon: Melrose Nakayama, MD;  Location: [place];  Service: Open Heart Surgery;  Laterality: N/A;  times 4 using left internal mammary artery and endoscopically harvested bilateral saphenous vein   . EP IMPLANTABLE DEVICE N/A 09/22/2015   Procedure: BiV Upgrade;  Surgeon: Deboraha Sprang, MD;  Location: [place] CV LAB;  Service: Cardiovascular;  Laterality: N/A;  . INTRAOPERATIVE TRANSESOPHAGEAL ECHOCARDIOGRAM N/A 10/25/2012   Procedure: INTRAOPERATIVE TRANSESOPHAGEAL ECHOCARDIOGRAM;  Surgeon: Melrose Nakayama, MD;  Location: [place];  Service: Open Heart Surgery;  Laterality: N/A;  . LEFT AND RIGHT HEART CATHETERIZATION WITH CORONARY  ANGIOGRAM N/A 10/22/2012   Procedure: LEFT AND RIGHT HEART CATHETERIZATION WITH CORONARY ANGIOGRAM;  Surgeon: Larey Dresser, MD;  Location: [facility] CATH LAB;  Service: Cardiovascular;  Laterality: N/A;  . NASAL FRACTURE SURGERY  1970's  . PACEMAKER REVISION  09/22/2015   pacemaker upgrade   . PERMANENT PACEMAKER INSERTION N/A 07/15/2014   Procedure: PERMANENT PACEMAKER INSERTION;  Surgeon: Deboraha Sprang, MD;  Location: [facility] CATH LAB;  Service: Cardiovascular;  Laterality: N/A;  . TRACHEOSTOMY  07/2012  . TRACHEOSTOMY CLOSURE  08/2012  . TRANSURETHRAL MICROWAVE THERAPY  10/15/2012   Social History   Socioeconomic History  . Marital status: Married    Spouse name: Not on file  . Number of children:  2  . Years of education: Not on file  . Highest education level: Not on file  Occupational History  .  Retired  Scientific laboratory technician  . Financial resource strain: Not on file  . Food insecurity:    Worry: Not on file    Inability: Not on file  . Transportation needs:    Medical: Not on file    Non-medical: Not on file  Tobacco Use  . Smoking status: Former Smoker    Packs/day: 2.00    Years: 58.00    Pack years:  116.00    Types: Cigarettes    Last attempt to quit: 07/20/2012    Years since quitting: 5.7  . Smokeless tobacco: Never Used  Substance and Sexual Activity  . Alcohol use: No    Alcohol/week:  Beer once or twice a year: 2 drinks    Comment: 10/17/2012 "years since I've had a drink; never had problem with it"  . Drug use: No  . Sexual activity: Not Currently   Current Outpatient Medications on File Prior to Visit  Medication Sig Dispense Refill  . allopurinol (ZYLOPRIM) 300 MG tablet Take 300 mg by mouth daily.    Marland Kitchen aspirin EC 81 MG tablet Take 81 mg by mouth daily.    Marland Kitchen atorvastatin (LIPITOR) 80 MG tablet TAKE 1 TABLET BY MOUTH EVERY DAY BEFORE BREAKFAST 90 tablet 1  . bisoprolol (ZEBETA) 5 MG tablet TAKE 1/2 TABLET BY MOUTH DAILY 45 tablet 1  . calcitRIOL (ROCALTROL) 0.25 MCG capsule Take 0.25 mcg by mouth daily.     Marland Kitchen dextromethorphan-guaiFENesin (MUCINEX DM) 30-600 MG 12hr tablet Take 1 tablet by mouth 2 (two) times daily.    . fexofenadine (ALLEGRA) 180 MG tablet Take 180 mg by mouth daily.     . Insulin Degludec (TRESIBA FLEXTOUCH) 200 UNIT/ML SOPN Inject 26 Units into the skin daily. 6 pen 5  . Insulin Pen Needle 31G X 4 MM MISC 1 each by Does not apply route daily. Use to inject insulin daily 100 each 2  . ipratropium-albuterol (DUONEB) 0.5-2.5 (3) MG/3ML SOLN TAKE 3 MLS BY NEBULIZATION EVERY 6 (SIX) HOURS AS NEEDED. DX: J44.9 360 mL 5  . nitroGLYCERIN (NITROSTAT) 0.4 MG SL tablet PLACE 1 TABLET UNDER TONGUE EVERY 5 MINUTES AS NEEDED FOR CHEST PAIN 25 tablet 0  . Omega-3 Fatty Acids (FISH OIL MAXIMUM STRENGTH PO) Take 1 capsule by mouth daily.     . pantoprazole (PROTONIX) 40 MG tablet Take 40 mg by mouth at bedtime.     . potassium chloride (K-DUR,KLOR-CON) 10 MEQ tablet Take 20 mEq by mouth daily.     . Semaglutide,0.25 or 0.5MG /DOS, (OZEMPIC, 0.25 OR 0.5 MG/DOSE,) 2 MG/1.5ML SOPN Inject 0.5 mg into the skin once a week. 2 pen 5  .  torsemide (DEMADEX) 20 MG tablet Take 2 tablets  (40 mg total) by mouth 2 (two) times daily. 120 tablet 6   No current facility-administered medications on file prior to visit.    Allergies  Allergen Reactions  . Iodinated Diagnostic Agents   . Omnipaque [Iohexol] Other (See Comments)    Decreased kidney function  . Primaxin [Imipenem] Hives  . Cephalosporins Rash    Blisters  . Lisinopril     Drops Blood pressure  nausea   . Clindamycin/Lincomycin Swelling and Rash   Family History  Problem Relation Age of Onset  . Cancer Mother   . Heart disease Father   . Congestive Heart Failure Unknown   . Heart disease Unknown    Also,  hyperlipidemia, heart disease, hypertension and brother. Thyroid disease and cancer in mother.  PE: BP 118/80   Pulse 84   Temp 98 F (36.7 C)   Ht 5\' 5"  (1.651 m)   Wt 236 lb (107 kg)   SpO2 96%   BMI 39.27 kg/m  Wt Readings from Last 3 Encounters:  11/18/18 236 lb (107 kg)  11/05/18 235 lb (106.6 kg)  08/30/18 235 lb (106.6 kg)   Constitutional: overweight, in NAD Eyes: PERRLA, EOMI, no exophthalmos ENT: moist mucous membranes, no thyromegaly, no cervical lymphadenopathy Cardiovascular: RRR, No MRG Respiratory: CTA B Gastrointestinal: abdomen soft, NT, ND, BS+ Musculoskeletal: no deformities, strength intact in all 4 Skin: moist, warm, no rashes Neurological: no tremor with outstretched hands, DTR normal in all 4  ASSESSMENT: 1. 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 HL  3.  Obesity  PLAN:  1. Patient with longstanding, previously uncontrolled, type 2 diabetes, on basal insulin and weekly GLP-1 receptor agonist.  Since he has CKD, we could not add an SGLT 2 inhibitor, which would have been ideal for his history of CHF.  Ozempic was added instead and the sugars improved, however, he is having a hard time with the cost of both [place] and Ozempic.   We gave him samples, but we will need to find a more permanent solution for him.  We discussed at last visit about using patient assistance.  He and his wife are planning to discuss with the pharmacist about this. -At last visit we did not change his regimen but we did discuss about improving his diet by limiting snacks especially at night.  At that time, his sugars were improved and HbA1c was also better, at 6.6%. -At this visit, sugars are higher at all times of the day, especially in the morning.  We discussed about the options of increasing Ozempic or increasing [place].  For now, since we are giving him a sample of Ozempic 0.5 mg weekly, I advised him to increase [place].  If he needs to fill the prescription of Ozempic in the near future, I advised him to let me know so I can call in the 1 mg weekly dose. - I suggested to:  Patient Instructions  Please increase: - Tresiba U200 30 units daily  Please continue: - Ozempic 0.5 mg weekly  Please return in 3-4 months with your sugar log.   - today, HbA1c is 7.1% (higher) - continue checking sugars at different times of the day - check 1x a day, rotating checks - advised for yearly eye exams >> he is UTD - Return to clinic in 4  mo with sugar log   2. HL - Reviewed latest lipid panel from 07/2018: HDL low, the rest of the fractions at goal Lab Results  Component Value Date   CHOL 99 07/04/2018   HDL 20 (L) 07/04/2018   LDLCALC 56 07/04/2018   LDLDIRECT 61.5 05/15/2013   TRIG 114 07/04/2018   CHOLHDL 5.0 07/04/2018  - Continues Lipitor and fish oil without side effects.  3.  Obesity -Continue Ozempic which should also help with weight loss -Weight is stable since last visit -Continue exercise on the recumbent bike  Philemon Kingdom, MD PhD [facility] Endocrinology

## 2018-11-18 ENCOUNTER — Ambulatory Visit (INDEPENDENT_AMBULATORY_CARE_PROVIDER_SITE_OTHER): Payer: Medicare Other | Admitting: Internal Medicine

## 2018-11-18 ENCOUNTER — Other Ambulatory Visit: Payer: Self-pay

## 2018-11-18 ENCOUNTER — Encounter: Payer: Self-pay | Admitting: Internal Medicine

## 2018-11-18 VITALS — BP 118/80 | HR 84 | Temp 98.0°F | Ht 65.0 in | Wt 236.0 lb

## 2018-11-18 DIAGNOSIS — E1165 Type 2 diabetes mellitus with hyperglycemia: Secondary | ICD-10-CM

## 2018-11-18 DIAGNOSIS — I2510 Atherosclerotic heart disease of native coronary artery without angina pectoris: Secondary | ICD-10-CM | POA: Diagnosis not present

## 2018-11-18 DIAGNOSIS — E1159 Type 2 diabetes mellitus with other circulatory complications: Secondary | ICD-10-CM | POA: Diagnosis not present

## 2018-11-18 DIAGNOSIS — M109 Gout, unspecified: Secondary | ICD-10-CM | POA: Diagnosis not present

## 2018-11-18 DIAGNOSIS — I255 Ischemic cardiomyopathy: Secondary | ICD-10-CM | POA: Diagnosis not present

## 2018-11-18 DIAGNOSIS — I119 Hypertensive heart disease without heart failure: Secondary | ICD-10-CM | POA: Diagnosis not present

## 2018-11-18 DIAGNOSIS — I739 Peripheral vascular disease, unspecified: Secondary | ICD-10-CM | POA: Diagnosis not present

## 2018-11-18 DIAGNOSIS — E119 Type 2 diabetes mellitus without complications: Secondary | ICD-10-CM | POA: Diagnosis not present

## 2018-11-18 DIAGNOSIS — G4733 Obstructive sleep apnea (adult) (pediatric): Secondary | ICD-10-CM | POA: Diagnosis not present

## 2018-11-18 LAB — POCT GLYCOSYLATED HEMOGLOBIN (HGB A1C): Hemoglobin A1C: 7.1 % — AB (ref 4.0–5.6)

## 2018-11-18 NOTE — Addendum Note (Signed)
Addended by: Cardell Peach I on: 11/18/2018 01:05 PM   Modules accepted: Orders

## 2018-11-18 NOTE — Patient Instructions (Signed)
Please increase: - Tresiba U200 30 units daily  Please continue: - Ozempic 0.5 mg weekly  Please return in 3-4 months with your sugar log.

## 2018-11-24 ENCOUNTER — Other Ambulatory Visit: Payer: Self-pay | Admitting: Cardiology

## 2018-12-01 ENCOUNTER — Other Ambulatory Visit (HOSPITAL_COMMUNITY): Payer: Self-pay | Admitting: Internal Medicine

## 2018-12-23 DIAGNOSIS — E119 Type 2 diabetes mellitus without complications: Secondary | ICD-10-CM | POA: Diagnosis not present

## 2018-12-29 ENCOUNTER — Encounter: Payer: Self-pay | Admitting: Internal Medicine

## 2018-12-30 MED ORDER — OZEMPIC (0.25 OR 0.5 MG/DOSE) 2 MG/1.5ML ~~LOC~~ SOPN: 1.0000 mg | PEN_INJECTOR | SUBCUTANEOUS | 5 refills | Status: DC

## 2018-12-31 ENCOUNTER — Other Ambulatory Visit: Payer: Self-pay

## 2018-12-31 MED ORDER — OZEMPIC (1 MG/DOSE) 2 MG/1.5ML ~~LOC~~ SOPN: 1.0000 mg | PEN_INJECTOR | SUBCUTANEOUS | 2 refills | Status: DC

## 2019-01-02 ENCOUNTER — Other Ambulatory Visit (HOSPITAL_COMMUNITY): Payer: Self-pay | Admitting: Cardiology

## 2019-01-14 DIAGNOSIS — N183 Chronic kidney disease, stage 3 (moderate): Secondary | ICD-10-CM | POA: Diagnosis not present

## 2019-01-15 ENCOUNTER — Ambulatory Visit (INDEPENDENT_AMBULATORY_CARE_PROVIDER_SITE_OTHER): Payer: Medicare Other | Admitting: *Deleted

## 2019-01-15 DIAGNOSIS — I5022 Chronic systolic (congestive) heart failure: Secondary | ICD-10-CM

## 2019-01-15 DIAGNOSIS — I442 Atrioventricular block, complete: Secondary | ICD-10-CM

## 2019-01-15 LAB — CUP PACEART REMOTE DEVICE CHECK
Date Time Interrogation Session: 20200715134355
Implantable Lead Implant Date: 20160113
Implantable Lead Implant Date: 20160113
Implantable Lead Implant Date: 20170322
Implantable Lead Location: 753858
Implantable Lead Location: 753859
Implantable Lead Location: 753860
Implantable Lead Model: 5076
Implantable Lead Model: 5076
Implantable Pulse Generator Implant Date: 20170322
Pulse Gen Model: 3262
Pulse Gen Serial Number: 7838612

## 2019-01-23 DIAGNOSIS — D631 Anemia in chronic kidney disease: Secondary | ICD-10-CM | POA: Diagnosis not present

## 2019-01-23 DIAGNOSIS — N183 Chronic kidney disease, stage 3 (moderate): Secondary | ICD-10-CM | POA: Diagnosis not present

## 2019-01-23 DIAGNOSIS — N2581 Secondary hyperparathyroidism of renal origin: Secondary | ICD-10-CM | POA: Diagnosis not present

## 2019-01-23 DIAGNOSIS — I129 Hypertensive chronic kidney disease with stage 1 through stage 4 chronic kidney disease, or unspecified chronic kidney disease: Secondary | ICD-10-CM | POA: Diagnosis not present

## 2019-01-23 DIAGNOSIS — E1122 Type 2 diabetes mellitus with diabetic chronic kidney disease: Secondary | ICD-10-CM | POA: Diagnosis not present

## 2019-01-25 ENCOUNTER — Encounter: Payer: Self-pay | Admitting: Cardiology

## 2019-01-25 NOTE — Progress Notes (Signed)
Remote pacemaker transmission.   

## 2019-02-05 ENCOUNTER — Encounter: Payer: Self-pay | Admitting: Internal Medicine

## 2019-02-05 NOTE — Telephone Encounter (Signed)
Please call pt to reschedule appt per her request

## 2019-02-18 DIAGNOSIS — N318 Other neuromuscular dysfunction of bladder: Secondary | ICD-10-CM | POA: Diagnosis not present

## 2019-02-18 DIAGNOSIS — R972 Elevated prostate specific antigen [PSA]: Secondary | ICD-10-CM | POA: Diagnosis not present

## 2019-02-18 DIAGNOSIS — N400 Enlarged prostate without lower urinary tract symptoms: Secondary | ICD-10-CM | POA: Diagnosis not present

## 2019-02-18 DIAGNOSIS — N3020 Other chronic cystitis without hematuria: Secondary | ICD-10-CM | POA: Diagnosis not present

## 2019-02-18 DIAGNOSIS — N3090 Cystitis, unspecified without hematuria: Secondary | ICD-10-CM | POA: Diagnosis not present

## 2019-02-18 DIAGNOSIS — N471 Phimosis: Secondary | ICD-10-CM | POA: Diagnosis not present

## 2019-02-25 ENCOUNTER — Other Ambulatory Visit (HOSPITAL_COMMUNITY): Payer: Self-pay | Admitting: Cardiology

## 2019-02-26 ENCOUNTER — Telehealth (HOSPITAL_COMMUNITY): Payer: Self-pay | Admitting: Cardiology

## 2019-02-26 ENCOUNTER — Other Ambulatory Visit (HOSPITAL_COMMUNITY): Payer: Self-pay | Admitting: Cardiology

## 2019-02-26 NOTE — Telephone Encounter (Signed)
Cardiac clearance singed by Dr [name] Patient may proceed with procedure from a cardiac standpoint Clearance returned to [phone]  [facility]

## 2019-03-03 DIAGNOSIS — N3020 Other chronic cystitis without hematuria: Secondary | ICD-10-CM | POA: Diagnosis not present

## 2019-03-03 DIAGNOSIS — N471 Phimosis: Secondary | ICD-10-CM | POA: Diagnosis not present

## 2019-03-03 DIAGNOSIS — N3090 Cystitis, unspecified without hematuria: Secondary | ICD-10-CM | POA: Diagnosis not present

## 2019-03-04 ENCOUNTER — Encounter: Payer: Self-pay | Admitting: Internal Medicine

## 2019-03-05 ENCOUNTER — Telehealth: Payer: Self-pay | Admitting: Internal Medicine

## 2019-03-05 MED ORDER — ONETOUCH VERIO VI STRP: ORAL_STRIP | 12 refills | Status: DC

## 2019-03-05 NOTE — Telephone Encounter (Signed)
Disregard opened in error °

## 2019-03-06 DIAGNOSIS — G4733 Obstructive sleep apnea (adult) (pediatric): Secondary | ICD-10-CM | POA: Diagnosis not present

## 2019-03-06 DIAGNOSIS — I110 Hypertensive heart disease with heart failure: Secondary | ICD-10-CM | POA: Diagnosis not present

## 2019-03-06 DIAGNOSIS — N3020 Other chronic cystitis without hematuria: Secondary | ICD-10-CM | POA: Diagnosis not present

## 2019-03-06 DIAGNOSIS — E785 Hyperlipidemia, unspecified: Secondary | ICD-10-CM | POA: Diagnosis not present

## 2019-03-06 DIAGNOSIS — J309 Allergic rhinitis, unspecified: Secondary | ICD-10-CM | POA: Diagnosis not present

## 2019-03-06 DIAGNOSIS — Z951 Presence of aortocoronary bypass graft: Secondary | ICD-10-CM | POA: Diagnosis not present

## 2019-03-06 DIAGNOSIS — N471 Phimosis: Secondary | ICD-10-CM | POA: Diagnosis not present

## 2019-03-06 DIAGNOSIS — I10 Essential (primary) hypertension: Secondary | ICD-10-CM | POA: Diagnosis not present

## 2019-03-06 DIAGNOSIS — I252 Old myocardial infarction: Secondary | ICD-10-CM | POA: Diagnosis not present

## 2019-03-06 DIAGNOSIS — Z794 Long term (current) use of insulin: Secondary | ICD-10-CM | POA: Diagnosis not present

## 2019-03-06 DIAGNOSIS — Z79899 Other long term (current) drug therapy: Secondary | ICD-10-CM | POA: Diagnosis not present

## 2019-03-06 DIAGNOSIS — I739 Peripheral vascular disease, unspecified: Secondary | ICD-10-CM | POA: Diagnosis not present

## 2019-03-06 DIAGNOSIS — K219 Gastro-esophageal reflux disease without esophagitis: Secondary | ICD-10-CM | POA: Diagnosis not present

## 2019-03-06 DIAGNOSIS — Z7982 Long term (current) use of aspirin: Secondary | ICD-10-CM | POA: Diagnosis not present

## 2019-03-06 DIAGNOSIS — I509 Heart failure, unspecified: Secondary | ICD-10-CM | POA: Diagnosis not present

## 2019-03-06 DIAGNOSIS — Z01818 Encounter for other preprocedural examination: Secondary | ICD-10-CM | POA: Diagnosis not present

## 2019-03-06 DIAGNOSIS — I2510 Atherosclerotic heart disease of native coronary artery without angina pectoris: Secondary | ICD-10-CM | POA: Diagnosis not present

## 2019-03-06 DIAGNOSIS — E119 Type 2 diabetes mellitus without complications: Secondary | ICD-10-CM | POA: Diagnosis not present

## 2019-03-06 DIAGNOSIS — Z6838 Body mass index (BMI) 38.0-38.9, adult: Secondary | ICD-10-CM | POA: Diagnosis not present

## 2019-03-06 DIAGNOSIS — J449 Chronic obstructive pulmonary disease, unspecified: Secondary | ICD-10-CM | POA: Diagnosis not present

## 2019-03-22 ENCOUNTER — Inpatient Hospital Stay (HOSPITAL_COMMUNITY)
Admission: AD | Admit: 2019-03-22 | Discharge: 2019-04-01 | DRG: 177 | Disposition: A | Discharge status: Home / Self Care | Payer: Medicare Other | Attending: Internal Medicine | Admitting: Internal Medicine

## 2019-03-22 DIAGNOSIS — E870 Hyperosmolality and hypernatremia: Secondary | ICD-10-CM | POA: Diagnosis not present

## 2019-03-22 DIAGNOSIS — I5032 Chronic diastolic (congestive) heart failure: Secondary | ICD-10-CM | POA: Diagnosis present

## 2019-03-22 DIAGNOSIS — Z79899 Other long term (current) drug therapy: Secondary | ICD-10-CM

## 2019-03-22 DIAGNOSIS — D696 Thrombocytopenia, unspecified: Secondary | ICD-10-CM | POA: Diagnosis present

## 2019-03-22 DIAGNOSIS — S0990XA Unspecified injury of head, initial encounter: Secondary | ICD-10-CM | POA: Diagnosis not present

## 2019-03-22 DIAGNOSIS — D638 Anemia in other chronic diseases classified elsewhere: Secondary | ICD-10-CM | POA: Diagnosis present

## 2019-03-22 DIAGNOSIS — M6282 Rhabdomyolysis: Secondary | ICD-10-CM | POA: Diagnosis present

## 2019-03-22 DIAGNOSIS — Z6838 Body mass index (BMI) 38.0-38.9, adult: Secondary | ICD-10-CM

## 2019-03-22 DIAGNOSIS — E1165 Type 2 diabetes mellitus with hyperglycemia: Secondary | ICD-10-CM | POA: Diagnosis present

## 2019-03-22 DIAGNOSIS — J1289 Other viral pneumonia: Secondary | ICD-10-CM | POA: Diagnosis present

## 2019-03-22 DIAGNOSIS — L304 Erythema intertrigo: Secondary | ICD-10-CM | POA: Diagnosis present

## 2019-03-22 DIAGNOSIS — E785 Hyperlipidemia, unspecified: Secondary | ICD-10-CM | POA: Diagnosis present

## 2019-03-22 DIAGNOSIS — J9601 Acute respiratory failure with hypoxia: Secondary | ICD-10-CM | POA: Diagnosis present

## 2019-03-22 DIAGNOSIS — N183 Chronic kidney disease, stage 3 unspecified: Secondary | ICD-10-CM | POA: Diagnosis present

## 2019-03-22 DIAGNOSIS — L89150 Pressure ulcer of sacral region, unstageable: Secondary | ICD-10-CM | POA: Diagnosis present

## 2019-03-22 DIAGNOSIS — N179 Acute kidney failure, unspecified: Secondary | ICD-10-CM | POA: Diagnosis present

## 2019-03-22 DIAGNOSIS — Z87891 Personal history of nicotine dependence: Secondary | ICD-10-CM | POA: Diagnosis not present

## 2019-03-22 DIAGNOSIS — G4733 Obstructive sleep apnea (adult) (pediatric): Secondary | ICD-10-CM | POA: Diagnosis present

## 2019-03-22 DIAGNOSIS — Z23 Encounter for immunization: Secondary | ICD-10-CM | POA: Diagnosis not present

## 2019-03-22 DIAGNOSIS — E669 Obesity, unspecified: Secondary | ICD-10-CM | POA: Diagnosis present

## 2019-03-22 DIAGNOSIS — L89159 Pressure ulcer of sacral region, unspecified stage: Secondary | ICD-10-CM | POA: Diagnosis not present

## 2019-03-22 DIAGNOSIS — Z8546 Personal history of malignant neoplasm of prostate: Secondary | ICD-10-CM | POA: Diagnosis not present

## 2019-03-22 DIAGNOSIS — E1159 Type 2 diabetes mellitus with other circulatory complications: Secondary | ICD-10-CM | POA: Diagnosis present

## 2019-03-22 DIAGNOSIS — Z951 Presence of aortocoronary bypass graft: Secondary | ICD-10-CM

## 2019-03-22 DIAGNOSIS — J1282 Pneumonia due to coronavirus disease 2019: Secondary | ICD-10-CM

## 2019-03-22 DIAGNOSIS — U071 COVID-19: Secondary | ICD-10-CM | POA: Diagnosis present

## 2019-03-22 DIAGNOSIS — A4189 Other specified sepsis: Secondary | ICD-10-CM | POA: Diagnosis not present

## 2019-03-22 DIAGNOSIS — J440 Chronic obstructive pulmonary disease with acute lower respiratory infection: Secondary | ICD-10-CM | POA: Diagnosis present

## 2019-03-22 DIAGNOSIS — Z794 Long term (current) use of insulin: Secondary | ICD-10-CM

## 2019-03-22 DIAGNOSIS — I130 Hypertensive heart and chronic kidney disease with heart failure and stage 1 through stage 4 chronic kidney disease, or unspecified chronic kidney disease: Secondary | ICD-10-CM | POA: Diagnosis present

## 2019-03-22 DIAGNOSIS — Z950 Presence of cardiac pacemaker: Secondary | ICD-10-CM

## 2019-03-22 DIAGNOSIS — Z809 Family history of malignant neoplasm, unspecified: Secondary | ICD-10-CM

## 2019-03-22 DIAGNOSIS — I2510 Atherosclerotic heart disease of native coronary artery without angina pectoris: Secondary | ICD-10-CM | POA: Diagnosis present

## 2019-03-22 DIAGNOSIS — Z888 Allergy status to other drugs, medicaments and biological substances status: Secondary | ICD-10-CM

## 2019-03-22 DIAGNOSIS — R509 Fever, unspecified: Secondary | ICD-10-CM | POA: Diagnosis not present

## 2019-03-22 DIAGNOSIS — S199XXA Unspecified injury of neck, initial encounter: Secondary | ICD-10-CM | POA: Diagnosis not present

## 2019-03-22 DIAGNOSIS — Z923 Personal history of irradiation: Secondary | ICD-10-CM

## 2019-03-22 DIAGNOSIS — Z7982 Long term (current) use of aspirin: Secondary | ICD-10-CM

## 2019-03-22 DIAGNOSIS — E1122 Type 2 diabetes mellitus with diabetic chronic kidney disease: Secondary | ICD-10-CM | POA: Diagnosis present

## 2019-03-22 DIAGNOSIS — Z953 Presence of xenogenic heart valve: Secondary | ICD-10-CM

## 2019-03-22 DIAGNOSIS — I214 Non-ST elevation (NSTEMI) myocardial infarction: Secondary | ICD-10-CM | POA: Diagnosis not present

## 2019-03-22 DIAGNOSIS — Z8249 Family history of ischemic heart disease and other diseases of the circulatory system: Secondary | ICD-10-CM

## 2019-03-22 DIAGNOSIS — A419 Sepsis, unspecified organism: Secondary | ICD-10-CM | POA: Diagnosis not present

## 2019-03-22 HISTORY — DX: Pneumonia due to coronavirus disease 2019: J12.82

## 2019-03-22 LAB — GLUCOSE, CAPILLARY: Glucose-Capillary: 217 mg/dL — ABNORMAL HIGH (ref 70–99)

## 2019-03-22 MED ORDER — ALLOPURINOL 100 MG PO TABS
300.0000 mg | ORAL_TABLET | Freq: Every day | ORAL | Status: DC
  Administered 2019-03-23 – 2019-04-01 (×10): 300 mg via ORAL
  Filled 2019-03-22 (×10): qty 3

## 2019-03-22 MED ORDER — ATORVASTATIN CALCIUM 40 MG PO TABS
80.0000 mg | ORAL_TABLET | Freq: Every day | ORAL | Status: DC
  Administered 2019-03-23 – 2019-04-01 (×10): 80 mg via ORAL
  Filled 2019-03-22 (×9): qty 2
  Filled 2019-03-22: qty 8

## 2019-03-22 MED ORDER — CALCITRIOL 0.25 MCG PO CAPS
0.2500 ug | ORAL_CAPSULE | Freq: Every day | ORAL | Status: DC
  Administered 2019-03-23 – 2019-04-01 (×10): 0.25 ug via ORAL
  Filled 2019-03-22 (×11): qty 1

## 2019-03-22 MED ORDER — PANTOPRAZOLE SODIUM 40 MG PO TBEC
40.0000 mg | DELAYED_RELEASE_TABLET | Freq: Every day | ORAL | Status: DC
  Administered 2019-03-22 – 2019-03-31 (×10): 40 mg via ORAL
  Filled 2019-03-22 (×10): qty 1

## 2019-03-22 MED ORDER — HEPARIN SODIUM (PORCINE) 10000 UNIT/ML IJ SOLN
7500.0000 [IU] | Freq: Three times a day (TID) | INTRAMUSCULAR | Status: DC
  Administered 2019-03-23 – 2019-03-26 (×10): 7500 [IU] via SUBCUTANEOUS
  Filled 2019-03-22 (×10): qty 1

## 2019-03-22 MED ORDER — INSULIN GLARGINE 100 UNIT/ML ~~LOC~~ SOLN
20.0000 [IU] | Freq: Every day | SUBCUTANEOUS | Status: DC
  Administered 2019-03-23 – 2019-03-24 (×2): 20 [IU] via SUBCUTANEOUS
  Filled 2019-03-22 (×2): qty 0.2

## 2019-03-22 MED ORDER — ONDANSETRON HCL 4 MG/2ML IJ SOLN: 4.0000 mg | Freq: Four times a day (QID) | INTRAMUSCULAR | Status: DC | PRN

## 2019-03-22 MED ORDER — ONDANSETRON HCL 4 MG PO TABS: 4.0000 mg | ORAL_TABLET | Freq: Four times a day (QID) | ORAL | Status: DC | PRN

## 2019-03-22 MED ORDER — INSULIN ASPART 100 UNIT/ML ~~LOC~~ SOLN
0.0000 [IU] | Freq: Every day | SUBCUTANEOUS | Status: DC
  Administered 2019-03-22: 2 [IU] via SUBCUTANEOUS
  Administered 2019-03-23: 22:00:00 3 [IU] via SUBCUTANEOUS
  Administered 2019-03-24: 4 [IU] via SUBCUTANEOUS

## 2019-03-22 MED ORDER — DEXAMETHASONE 6 MG PO TABS
6.0000 mg | ORAL_TABLET | ORAL | Status: AC
  Administered 2019-03-23 – 2019-04-01 (×10): 6 mg via ORAL
  Filled 2019-03-22 (×10): qty 1

## 2019-03-22 MED ORDER — GUAIFENESIN-DM 100-10 MG/5ML PO SYRP
10.0000 mL | ORAL_SOLUTION | ORAL | Status: DC | PRN
  Administered 2019-03-23 – 2019-03-26 (×2): 10 mL via ORAL
  Filled 2019-03-22 (×2): qty 10

## 2019-03-22 MED ORDER — BISOPROLOL FUMARATE 5 MG PO TABS
2.5000 mg | ORAL_TABLET | Freq: Every day | ORAL | Status: DC
  Administered 2019-03-23 – 2019-04-01 (×10): 2.5 mg via ORAL
  Filled 2019-03-22 (×13): qty 0.5

## 2019-03-22 MED ORDER — ACETAMINOPHEN 325 MG PO TABS
650.0000 mg | ORAL_TABLET | Freq: Four times a day (QID) | ORAL | Status: DC | PRN
  Administered 2019-03-24: 650 mg via ORAL
  Filled 2019-03-22: qty 2

## 2019-03-22 MED ORDER — INSULIN ASPART 100 UNIT/ML ~~LOC~~ SOLN
0.0000 [IU] | Freq: Three times a day (TID) | SUBCUTANEOUS | Status: DC
  Administered 2019-03-23: 08:00:00 3 [IU] via SUBCUTANEOUS
  Administered 2019-03-23 (×2): 8 [IU] via SUBCUTANEOUS
  Administered 2019-03-24: 08:00:00 5 [IU] via SUBCUTANEOUS
  Administered 2019-03-24 (×2): 15 [IU] via SUBCUTANEOUS

## 2019-03-22 MED ORDER — IPRATROPIUM-ALBUTEROL 20-100 MCG/ACT IN AERS
1.0000 | INHALATION_SPRAY | Freq: Four times a day (QID) | RESPIRATORY_TRACT | Status: DC
  Administered 2019-03-22 – 2019-04-01 (×38): 1 via RESPIRATORY_TRACT
  Filled 2019-03-22: qty 4

## 2019-03-22 MED ORDER — SODIUM CHLORIDE 0.9 % IV SOLN
200.0000 mg | Freq: Once | INTRAVENOUS | Status: AC
  Administered 2019-03-22: 200 mg via INTRAVENOUS
  Filled 2019-03-22: qty 40

## 2019-03-22 MED ORDER — SODIUM CHLORIDE 0.9 % IV SOLN
100.0000 mg | INTRAVENOUS | Status: AC
  Administered 2019-03-23 – 2019-03-26 (×4): 100 mg via INTRAVENOUS
  Filled 2019-03-22 (×4): qty 20

## 2019-03-22 MED ORDER — HYDROCOD POLST-CPM POLST ER 10-8 MG/5ML PO SUER: 5.0000 mL | Freq: Two times a day (BID) | ORAL | Status: DC | PRN

## 2019-03-22 MED ORDER — ENOXAPARIN SODIUM 40 MG/0.4ML ~~LOC~~ SOLN: 40.0000 mg | SUBCUTANEOUS | Status: DC

## 2019-03-22 NOTE — H&P (Signed)
History and Physical    [name] DPO:[id number] DOB: [birth year] DOA: 03/22/2019  PCP: [name], MD  Patient coming from: Home, [place]  I have personally briefly reviewed patient's old medical records in [place]  Chief Complaint: SOB  HPI: [name] is a 78 y.o. 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  Patient lives with daughter who has [place].  Patient presented to [facility] ED today with SOB.  Had mild confusion at time of presentation.  Also had fallen earlier in day due to generalized weakness.  No reported fever, chills, N/V.  Denies CP.   ED Course: New O2 requirement, 10L via HFNC, CXR shows RLL and RML PNA.  CT head and neck show nothing acute.  Creat 2.2 BUN 66 (baseline CKD stage 3, runs ~1.6).  Given IVF bolus of 1L it looks like.  D.Dimer 2000 (2.x on our scale), CRP slightly elevated.  Trop 0.08, CP free, EKG just showing RBBB and paced (looks the same as last years EKG to me), cardiology similarly unimpressed and recd no further work up needed.  WBC 2.1k, platelets 83, HGB nl.  COVID positive.  Given solumedrol 125, also given aztreonam and vanc.  Procalcitonin negative.   Review of Systems: As per HPI, otherwise all review of systems negative.  Past Medical History:  Diagnosis Date  . AAA (abdominal aortic aneurysm) ([place])    5/14 CT showed > 6 cm AAA, also right iliac aneurysm. Not stent graft candidate.   . Anemia of chronic disease   . Anxiety    pt. admits that he has anxiety at times   . Aortic stenosis    Moderate to severe by echo in 4/14. Bioprosthetic #21 [facility] Ease aortic valve replacement in 4/14.   . Arthritis   . CAD (coronary artery disease)    a. 4/14 CABG: LIMA-LAD, seq SVG-ramus & OM1, SVG-PDA  . Carotid arterial disease (HCC)    Carotid dopplers (4/31) with 54-00%  LICA stenosis.   . CHF (congestive heart failure) ([place]) 07/2012; 10/17/2012  . Chronic kidney disease    increased creatinine recently - being followed, near kidney failure fr. contrast dye   . Chronic systolic heart failure (HCC)    a. 1/14 ECHO: sev dil LV, EF30-35%, diff HK, mild MR, AS severe, AV grad 35 b. 8/14 ECHO: EF 55-60%, mild biopros AV sten mn grad. 25, RV mild dil, RA mild dil  . Complication of anesthesia    during last surgery had to be given special medicine b/c "something dropped" during surgery   . COPD (chronic obstructive pulmonary disease) ([place])    a.  History of heavy smoking. PFTs (4/14) with mixed obstructive (COPD) and restrictive (post-ARDS) picture.   . Diabetes mellitus without complication ([place])   . Esophageal reflux   . History of blood transfusion    "lots since January" (10/17/2012)  . Hyperlipidemia   . Hypertension   . Ischemic cardiomyopathy   . Ischemic cardiomyopathy    a. 4/14 LHC: pLAD 95, ost ramus 70-80, mLCx 90, EF 30%  . OSA (obstructive sleep apnea)    on cpap- every sleep time.   . Pneumonia 07/2012   PNA (H1N1 influenza + pneumococcus) in 8/67 complicated by respiratory failure/ARDS. Required tracheostomy, now weaned off. Had left empyema requiring chest tube.   . Prediabetes   .  Prostate cancer [facility])     s/p radiation treatment. - (PT. DENIES)Has indwelling foley.   . Shortness of breath    still goes deer hunting by himself    Past Surgical History:  Procedure Laterality Date  . ABDOMINAL AORTIC ANEURYSM REPAIR N/A 07/10/2013   Procedure: Resection and Graftiong of perirenal AAA; Insertion 14 x 8 Hemashield Graft Aorta to Left Common Iliac and to Right Common Femoral Artery With Ligastion of Right External and Interanl Iliac Artery;  Surgeon: Mal Misty, MD;  Location: [place];  Service: Vascular;  Laterality: N/A;  . ANAL FISSURE REPAIR  2008  . AORTIC VALVE REPLACEMENT N/A 10/25/2012   Procedure: AORTIC VALVE REPLACEMENT (AVR);   Surgeon: Melrose Nakayama, MD;  Location: [place];  Service: Open Heart Surgery;  Laterality: N/A;  . CARDIAC VALVE REPLACEMENT    . CORONARY ARTERY BYPASS GRAFT N/A 10/25/2012   Procedure: CORONARY ARTERY BYPASS GRAFTING (CABG);  Surgeon: Melrose Nakayama, MD;  Location: [place];  Service: Open Heart Surgery;  Laterality: N/A;  times 4 using left internal mammary artery and endoscopically harvested bilateral saphenous vein   . EP IMPLANTABLE DEVICE N/A 09/22/2015   Procedure: BiV Upgrade;  Surgeon: Deboraha Sprang, MD;  Location: [place] CV LAB;  Service: Cardiovascular;  Laterality: N/A;  . INTRAOPERATIVE TRANSESOPHAGEAL ECHOCARDIOGRAM N/A 10/25/2012   Procedure: INTRAOPERATIVE TRANSESOPHAGEAL ECHOCARDIOGRAM;  Surgeon: Melrose Nakayama, MD;  Location: [place];  Service: Open Heart Surgery;  Laterality: N/A;  . LEFT AND RIGHT HEART CATHETERIZATION WITH CORONARY ANGIOGRAM N/A 10/22/2012   Procedure: LEFT AND RIGHT HEART CATHETERIZATION WITH CORONARY ANGIOGRAM;  Surgeon: Larey Dresser, MD;  Location: [facility] CATH LAB;  Service: Cardiovascular;  Laterality: N/A;  . NASAL FRACTURE SURGERY  1970's  . PACEMAKER REVISION  09/22/2015   pacemaker upgrade   . PERMANENT PACEMAKER INSERTION N/A 07/15/2014   Procedure: PERMANENT PACEMAKER INSERTION;  Surgeon: Deboraha Sprang, MD;  Location: [facility] CATH LAB;  Service: Cardiovascular;  Laterality: N/A;  . TRACHEOSTOMY  07/2012  . TRACHEOSTOMY CLOSURE  08/2012  . TRANSURETHRAL MICROWAVE THERAPY  10/15/2012     reports that he quit smoking about 6 years ago. His smoking use included cigarettes. He has a 116.00 pack-year smoking history. He has never used smokeless tobacco. He reports that he does not drink alcohol or use drugs.  Allergies  Allergen Reactions  . Iodinated Diagnostic Agents Other (See Comments)    Decreased kidney function  . Omnipaque [Iohexol] Other (See Comments)    Decreased kidney function  . Primaxin [Imipenem] Hives  . Cephalosporins Rash     Blisters   . Lisinopril Nausea And Vomiting    Drops blood pressure   . Clindamycin/Lincomycin Swelling and Rash    Family History  Problem Relation Age of Onset  . Cancer Mother   . Heart disease Father   . Congestive Heart Failure Unknown   . Heart disease Unknown      Prior to Admission medications   Medication Sig Start Date End Date Taking? Authorizing Provider  allopurinol (ZYLOPRIM) 300 MG tablet Take 300 mg by mouth daily.   Yes [provider]  aspirin EC 81 MG tablet Take 81 mg by mouth daily.   Yes [provider]  atorvastatin (LIPITOR) 80 MG tablet Take 1 tablet (80 mg total) by mouth daily before breakfast. Needs appt for further refills 02/26/19  Yes Larey Dresser, MD  bisoprolol (ZEBETA) 5 MG tablet Take 0.5 tablets (2.5 mg  total) by mouth daily. Needs appt for further refills 02/26/19  Yes Larey Dresser, MD  calcitRIOL (ROCALTROL) 0.25 MCG capsule Take 0.25 mcg by mouth daily.    Yes [provider]  dextromethorphan-guaiFENesin (MUCINEX DM) 30-600 MG 12hr tablet Take 1 tablet by mouth 2 (two) times daily.   Yes [provider]  fexofenadine (ALLEGRA) 180 MG tablet Take 90 mg by mouth daily.    Yes [provider]  Insulin Degludec (TRESIBA FLEXTOUCH) 200 UNIT/ML SOPN Inject 26 Units into the skin daily. Patient taking differently: Inject 30 Units into the skin daily.  08/30/18  Yes Philemon Kingdom, MD  ipratropium-albuterol (DUONEB) 0.5-2.5 (3) MG/3ML SOLN TAKE 3 MLS BY NEBULIZATION EVERY 6 (SIX) HOURS AS NEEDED. DX: J44.9 Patient taking differently: Take 3 mLs by nebulization every 6 (six) hours as needed (shortness of breath). Dx: J44.9 11/15/18  Yes Rigoberto Noel, MD  KLOR-CON M10 10 MEQ tablet TAKE 2 TABLETS (20 MEQ TOTAL) BY MOUTH 2 (TWO) TIMES DAILY. Patient taking differently: Take 20 mEq by mouth daily.  12/02/18  Yes Larey Dresser, MD  nitroGLYCERIN (NITROSTAT) 0.4 MG SL tablet PLACE 1 TABLET UNDER  TONGUE EVERY 5 MINUTES AS NEEDED FOR CHEST PAIN Patient taking differently: Place 0.4 mg under the tongue every 5 (five) minutes as needed for chest pain.  02/22/17  Yes Bensimhon, Shaune Pascal, MD  Omega-3 Fatty Acids (FISH OIL MAXIMUM STRENGTH PO) Take 1 capsule by mouth daily.    Yes [provider]  pantoprazole (PROTONIX) 40 MG tablet Take 40 mg by mouth at bedtime.  12/17/12  Yes [provider]  Semaglutide, 1 MG/DOSE, (OZEMPIC, 1 MG/DOSE,) 2 MG/1.5ML SOPN Inject 1 mg into the skin once a week. Patient taking differently: Inject 1 mg into the skin every Sunday.  12/31/18  Yes Philemon Kingdom, MD  torsemide (DEMADEX) 20 MG tablet TAKE 2 TABLETS (40 MG TOTAL) BY MOUTH 2 (TWO) TIMES DAILY. 02/26/19  Yes Larey Dresser, MD  glucose blood [facility] VERIO) test strip Use to check blood sugar once a day 03/05/19   Philemon Kingdom, MD  Insulin Pen Needle 31G X 4 MM MISC 1 each by Does not apply route daily. Use to inject insulin daily 06/17/18   Philemon Kingdom, MD    Physical Exam: Vitals:   03/22/19 2024 03/22/19 2031  Temp: 97.6 F (36.4 C)   TempSrc: Oral   Weight:  104.2 kg  Height:  5\' 5"  (1.651 m)    Constitutional: NAD, calm, comfortable Eyes: PERRL, lids and conjunctivae normal ENMT: Mucous membranes are moist. Posterior pharynx clear of any exudate or lesions.Normal dentition.  Neck: normal, supple, no masses, no thyromegaly Respiratory: bibasilar crackles Cardiovascular: Regular rate and rhythm, no murmurs / rubs / gallops. No extremity edema. 2+ pedal pulses. No carotid bruits.  Abdomen: no tenderness, no masses palpated. No hepatosplenomegaly. Bowel sounds positive. Midline scar present Musculoskeletal: no clubbing / cyanosis. No joint deformity upper and lower extremities. Good ROM, no contractures. Normal muscle tone.  Skin: Sacral decubitus, unstageable vs stage 1 with intact scab. Neurologic: CN 2-12 grossly intact. Sensation intact, DTR normal. Strength  5/5 in all 4.  Psychiatric: Normal judgment and insight. Alert and oriented x 3. Normal mood.    Labs on Admission: I have personally reviewed following labs and imaging studies  CBC: No results for input(s): WBC, NEUTROABS, HGB, HCT, MCV, PLT in the last 168 hours. Basic Metabolic Panel: No results for input(s): NA, K, CL, CO2, GLUCOSE,  BUN, CREATININE, CALCIUM, MG, PHOS in the last 168 hours. GFR: CrCl cannot be calculated (Patient's most recent lab result is older than the maximum 21 days allowed.). Liver Function Tests: No results for input(s): AST, ALT, ALKPHOS, BILITOT, PROT, ALBUMIN in the last 168 hours. No results for input(s): LIPASE, AMYLASE in the last 168 hours. No results for input(s): AMMONIA in the last 168 hours. Coagulation Profile: No results for input(s): INR, PROTIME in the last 168 hours. Cardiac Enzymes: No results for input(s): CKTOTAL, CKMB, CKMBINDEX, TROPONINI in the last 168 hours. BNP (last 3 results) No results for input(s): PROBNP in the last 8760 hours. HbA1C: No results for input(s): HGBA1C in the last 72 hours. CBG: Recent Labs  Lab 03/22/19 2018  GLUCAP 217*   Lipid Profile: No results for input(s): CHOL, HDL, LDLCALC, TRIG, CHOLHDL, LDLDIRECT in the last 72 hours. Thyroid Function Tests: No results for input(s): TSH, T4TOTAL, FREET4, T3FREE, THYROIDAB in the last 72 hours. Anemia Panel: No results for input(s): VITAMINB12, FOLATE, FERRITIN, TIBC, IRON, RETICCTPCT in the last 72 hours. Urine analysis:    Component Value Date/Time   COLORURINE YELLOW 07/11/2013 1345   APPEARANCEUR CLOUDY (A) 07/11/2013 1345   LABSPEC 1.011 07/11/2013 1345   PHURINE 5.5 07/11/2013 1345   GLUCOSEU NEGATIVE 07/11/2013 1345   HGBUR LARGE (A) 07/11/2013 1345   BILIRUBINUR NEGATIVE 07/11/2013 1345   KETONESUR NEGATIVE 07/11/2013 1345   PROTEINUR 30 (A) 07/11/2013 1345   UROBILINOGEN 0.2 07/11/2013 1345   NITRITE NEGATIVE 07/11/2013 1345   LEUKOCYTESUR  LARGE (A) 07/11/2013 1345    Radiological Exams on Admission: No results found.  EKG: Independently reviewed.  Assessment/Plan Principal Problem:   Pneumonia due to COVID-19 virus Active Problems:   Acute respiratory failure with hypoxia (HCC)   CAD (coronary artery disease)   Poorly controlled type 2 diabetes mellitus with circulatory disorder (HCC)   AKI (acute kidney injury) (HCC)   Chronic diastolic heart failure (HCC)   CKD (chronic kidney disease) stage 3, GFR 30-59 ml/min (HCC)    1. COVID 19 PNA with acute resp failure with hypoxia - 1. COVID pathway 2. Decadron 6mg  PO daily 3. remdesivir 4. Cont pulse ox 5. Tele monitor 6. Daily labs 7. Scheduled combivent 2. AKI on CKD stage 3 - 1. Pre-renal pattern, presumably secondary to COVID illness 2. IVF: got 1L bolus in ED, will hold off on further IVF for the moment 3. Holding torsemide which he usually takes BID 4. Strict intake and output 5. Repeat CMP in AM, if no improvement, then will need renal US, has fairly good UOP already since arrival to [facility] though (most of a urinal filled up), so obstructive uropathy seems less likely 6. UA pending, does have fairly decent UOP already 3. DM2 - 1. Lantus 20u daily 2. Mod scale SSI AC/HS for now 3. CBG check also at 0300 4. Think theres a pretty high chance that this will need to be adjusted though given steroids, acute illness, and AKI. 4. Chronic diastolic CHF, CAD - 1. Holding torsemide as above 2. Continue bisoprolol 3. Cont statin 5. Decubitus - 1. Wound care consult  DVT prophylaxis: Heparin [place] (stop the gtt) Code Status: Full Family Communication: No family in room Disposition Plan: Home after admit Consults called: None Admission status: Admit to inpatient  Severity of Illness: The appropriate patient status for this patient is INPATIENT. Inpatient status is judged to be reasonable and necessary in order to provide the required intensity of service to ensure  the patient's safety. The patient's presenting symptoms, physical exam findings, and initial radiographic and laboratory data in the context of their chronic comorbidities is felt to place them at high risk for further clinical deterioration. Furthermore, it is not anticipated that the patient will be medically stable for discharge from the hospital within 2 midnights of admission. The following factors support the patient status of inpatient.   IP status for COVID PNA with new O2 requirement.  * I certify that at the point of admission it is my clinical judgment that the patient will require inpatient hospital care spanning beyond 2 midnights from the point of admission due to high intensity of service, high risk for further deterioration and high frequency of surveillance required.*    GARDNER, JARED M. DO [facility]  How to contact the [facility] Attending or Consulting provider [place] or covering provider during after hours [place], for this patient?  1. Check the care team in [facility] and look for a) attending/consulting TR[facility] provider listed and b) the [facility] team listed 2. Log into [email or web address]  Amion Physician Scheduling and messaging for groups and whole hospitals  On call and physician scheduling software for group practices, residents, hospitalists and other medical providers for call, clinic, rotation and shift schedules. OnCall [employer] is a hospital-wide system for scheduling doctors and paging doctors on call. EasyPlot is for scientific plotting and data analysis.  [email or web address]  and use [place]'s universal password to access. If you do not have the password, please contact the hospital operator.  3. Locate the [facility] provider you are looking for under [facility] and page to a number that you can be directly reached. 4. If you still have difficulty reaching the provider, please page the [facility] (Director on Call) for the Hospitalists listed on amion for assistance.  03/22/2019, 8:47  PM

## 2019-03-23 ENCOUNTER — Encounter (HOSPITAL_COMMUNITY): Payer: Self-pay

## 2019-03-23 ENCOUNTER — Other Ambulatory Visit: Payer: Self-pay

## 2019-03-23 LAB — C-REACTIVE PROTEIN: CRP: 3.3 mg/dL — ABNORMAL HIGH (ref <1.0)

## 2019-03-23 LAB — COMPREHENSIVE METABOLIC PANEL
ALT: 38 U/L (ref 0–44)
AST: 65 U/L — ABNORMAL HIGH (ref 15–41)
Albumin: 2.8 g/dL — ABNORMAL LOW (ref 3.5–5.0)
Alkaline Phosphatase: 63 U/L (ref 38–126)
Anion gap: 10 (ref 5–15)
BUN: 55 mg/dL — ABNORMAL HIGH (ref 8–23)
CO2: 23 mmol/L (ref 22–32)
Calcium: 8.1 mg/dL — ABNORMAL LOW (ref 8.9–10.3)
Chloride: 107 mmol/L (ref 98–111)
Creatinine, Ser: 1.84 mg/dL — ABNORMAL HIGH (ref 0.61–1.24)
GFR calc Af Amer: 40 mL/min — ABNORMAL LOW (ref >60)
GFR calc non Af Amer: 35 mL/min — ABNORMAL LOW (ref >60)
Glucose, Bld: 198 mg/dL — ABNORMAL HIGH (ref 70–99)
Potassium: 3.8 mmol/L (ref 3.5–5.1)
Sodium: 140 mmol/L (ref 135–145)
Total Bilirubin: 0.7 mg/dL (ref 0.3–1.2)
Total Protein: 6.1 g/dL — ABNORMAL LOW (ref 6.5–8.1)

## 2019-03-23 LAB — CBC WITH DIFFERENTIAL/PLATELET
Abs Immature Granulocytes: 0.01 10*3/uL (ref 0.00–0.07)
Basophils Absolute: 0 10*3/uL (ref 0.0–0.1)
Basophils Relative: 0 %
Eosinophils Absolute: 0 10*3/uL (ref 0.0–0.5)
Eosinophils Relative: 0 %
HCT: 43.7 % (ref 39.0–52.0)
Hemoglobin: 13.1 g/dL (ref 13.0–17.0)
Immature Granulocytes: 1 %
Lymphocytes Relative: 16 %
Lymphs Abs: 0.2 10*3/uL — ABNORMAL LOW (ref 0.7–4.0)
MCH: 28.2 pg (ref 26.0–34.0)
MCHC: 30 g/dL (ref 30.0–36.0)
MCV: 94.2 fL (ref 80.0–100.0)
Monocytes Absolute: 0.1 10*3/uL (ref 0.1–1.0)
Monocytes Relative: 6 %
Neutro Abs: 1.2 10*3/uL — ABNORMAL LOW (ref 1.7–7.7)
Neutrophils Relative %: 77 %
Platelets: 95 10*3/uL — ABNORMAL LOW (ref 150–400)
RBC: 4.64 MIL/uL (ref 4.22–5.81)
RDW: 17.1 % — ABNORMAL HIGH (ref 11.5–15.5)
WBC: 1.5 10*3/uL — ABNORMAL LOW (ref 4.0–10.5)
nRBC: 0 % (ref 0.0–0.2)

## 2019-03-23 LAB — GLUCOSE, CAPILLARY
Glucose-Capillary: 197 mg/dL — ABNORMAL HIGH (ref 70–99)
Glucose-Capillary: 181 mg/dL — ABNORMAL HIGH (ref 70–99)
Glucose-Capillary: 252 mg/dL — ABNORMAL HIGH (ref 70–99)
Glucose-Capillary: 297 mg/dL — ABNORMAL HIGH (ref 70–99)
Glucose-Capillary: 274 mg/dL — ABNORMAL HIGH (ref 70–99)
Glucose-Capillary: 291 mg/dL — ABNORMAL HIGH (ref 70–99)

## 2019-03-23 LAB — URINALYSIS, ROUTINE W REFLEX MICROSCOPIC
Bilirubin Urine: NEGATIVE
Glucose, UA: NEGATIVE mg/dL
Ketones, ur: NEGATIVE mg/dL
Nitrite: NEGATIVE
Protein, ur: 30 mg/dL — AB
Specific Gravity, Urine: 1.013 (ref 1.005–1.030)
pH: 5 (ref 5.0–8.0)

## 2019-03-23 LAB — CK: Total CK: 737 U/L — ABNORMAL HIGH (ref 49–397)

## 2019-03-23 LAB — D-DIMER, QUANTITATIVE: D-Dimer, Quant: 1.64 ug/mL-FEU — ABNORMAL HIGH (ref 0.00–0.50)

## 2019-03-23 LAB — ABO/RH
ABO/RH(D): O NEG
Weak D: NEGATIVE

## 2019-03-23 MED ORDER — INFLUENZA VAC A&B SA ADJ QUAD 0.5 ML IM PRSY
0.5000 mL | PREFILLED_SYRINGE | INTRAMUSCULAR | Status: AC
  Administered 2019-03-25: 0.5 mL via INTRAMUSCULAR
  Filled 2019-03-23: qty 0.5

## 2019-03-23 MED ORDER — ASPIRIN EC 81 MG PO TBEC
81.0000 mg | DELAYED_RELEASE_TABLET | Freq: Every day | ORAL | Status: DC
  Administered 2019-03-23 – 2019-04-01 (×10): 81 mg via ORAL
  Filled 2019-03-23 (×10): qty 1

## 2019-03-23 MED ORDER — GERHARDT'S BUTT CREAM
TOPICAL_CREAM | Freq: Three times a day (TID) | CUTANEOUS | Status: DC
  Administered 2019-03-23 – 2019-03-24 (×2): via TOPICAL
  Administered 2019-03-24 (×2): 1 via TOPICAL
  Administered 2019-03-25 – 2019-03-27 (×8): via TOPICAL
  Administered 2019-03-27: 1 via TOPICAL
  Administered 2019-03-28 (×2): via TOPICAL
  Administered 2019-03-28: 1 via TOPICAL
  Administered 2019-03-29 – 2019-04-01 (×10): via TOPICAL
  Filled 2019-03-23: qty 1

## 2019-03-23 MED ORDER — GERHARDT'S BUTT CREAM
TOPICAL_CREAM | Freq: Two times a day (BID) | CUTANEOUS | Status: DC
  Administered 2019-03-23: 1 via TOPICAL
  Filled 2019-03-23: qty 1

## 2019-03-23 NOTE — Progress Notes (Signed)
Patient's wife updated on his condition and plan of care. Patient has spoken to his wife on the cell phone throughout the day.

## 2019-03-23 NOTE — Consult Note (Signed)
[place] Nurse wound consult note Remote consult visit performed today via FaceTime technology with the assistance of Bedside RN, Amy. Reason for Consult: Slow-to-blanch area of sustained erythema at the apex of the gluteal cleft.  No skin breakdown, no wound.  Wound type:Stage 1 vs area of Fungal overgrowth vs deep tissue pressure injury Pressure Injury POA: Yes Measurement: 2.5cm x 2.5cm area of bright red erythema, no satellite lesions. Slow-to-blanch. Wound bed: N/A Drainage (amount, consistency, odor) N/A Periwound:intact Dressing procedure/placement/frequency: I have advised patient to turn side to side and to spend as little time in the supine position as possible.  He is in agreement with this POC.  Amy (Bedside RN) has obtained [name]'s Butt Cream (a 1:1:1 compounded therapeutic consisting of lotrimin, zinc oxide and hydrocortisone cream), I will increase the use to three times daily. The silicone foam will not be used at this time as the cream is having difficulty adhering with its concomitant use.  [place] nursing team will not follow, but will remain available to this patient, the nursing and medical teams.  Please re-consult if needed. Thanks, Maudie Flakes, MSN, RN, [place], Arther Abbott  Pager# [phone]

## 2019-03-23 NOTE — Plan of Care (Addendum)
Unable to flush right anticubital IV site. Site with bloody drainage, site without RTS and removed. New IV site started in left hand x1 attempt with #20 G needle. Tolerated well.  [place] called and update given. All questions answered.

## 2019-03-23 NOTE — Progress Notes (Signed)
Pleasant, cooperative patient on tele monitor.  Paced in the 61s.  Bed alarm activated.  O2 via [place] at 7L.  Oriented to room.  Spoke with family while in the room with patient.  No acute distress present.  See chart for nsg assessment.

## 2019-03-23 NOTE — Progress Notes (Signed)
PROGRESS NOTE  MANPREET STREY  LAG:[id number] DOB: 1941-01-19 DOA: 03/22/2019 PCP: [name], MD   Brief Narrative: [name] is a 78 y.o. 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 He presented to [facility] 9/19 from home where his daughter stays and is covid-positive, with a day of SOB, weakness, having fallen at home and appearing mildly confused. On evaluation he was hypoxic requiring 10L HFNC, SARS-CoV-2 PCR positive, CXR with RML, RLL infiltrates, CRP and d-dimer elevated, PCT negative. Solumedrol, vancomycin, and aztreonam with 1L IVF administered and patient was admitted to [facility], started on remdesivir.  Assessment & Plan: Principal Problem:   Pneumonia due to COVID-19 virus Active Problems:   Acute respiratory failure with hypoxia (HCC)   CAD (coronary artery disease)   Poorly controlled type 2 diabetes mellitus with circulatory disorder (HCC)   AKI (acute kidney injury) (HCC)   Chronic diastolic heart failure (HCC)   CKD (chronic kidney disease) stage 3, GFR 30-59 ml/min (HCC)   Sacral decubitus ulcer  Acute hypoxic respiratory failure due to ZYYQM-25 pneumonia: Certainly at high risk of decompensation/progression to MODS/ARDS. Received vancomycin, aztreonam in ED though with covid proven and negative PCT, do not suspect bacterial sepsis at this time.  - Continue supplemental oxygen as needed to maintain SpO2 >90%. Requires close monitoring in PCU on 6L O2 in pt w/hx ARDS due to H1N1.  - Started remdesivir, continue 9/19 - 9/23 - Continue decadron - DC antibiotics. - Monitor for decompensation and consideration of convalescent plasma.  - Continue airborne, contact precautions. PPE including surgical gown, gloves, cap, shoe covers, and CAPR used during this encounter in a negative pressure room.  - Check  daily labs: CBC w/diff, CMP, d-dimer, ferritin, CRP. CRP 3.3 - Intermediate dose heparin due to elevated D-dimer and acuity of illness. - Maintain euvolemia/net negative.  - Avoid NSAIDs - Recommend proning and aggressive use of incentive spirometry. - Goals of care were discussed. Prognosis is guarded.   AKI on stage III CKD: Suspect prerenal injury. Cr 2.2 down to 1.84 following IVF, from a baseline of 1.6. Did received vancomycin in ED.  - Check daily.  - Avoid hypotension and nephrotoxins.   Thrombocytopenia: Chronic, dating back to 2017. Stable/slightly improved. Ok to use DVT ppx given high risk and stable/normal hgb.   Leukopenia: Due to covid viral infection most likely.  - Continue monitoring  IDT2DM:  - He takes 30u tresiba daily at home. Started on 20u daily + mod SSI AC/HS. Will continue to monitor and augment as needed.   CAD s/p CABG: No chest pain, troponin 0.08, paced rhythm without new ischemic changes on ECG.  - EDP discussed with cardiology who recommended no further work up at this time - Continue beta blocker, ASA, statin  Chronic HFpEF: Appears euvolemic - Given IVF. Will stop now that he's taking po. Holding demadex 40mg  BID for today, reevaluate tomorrow.   Fall at home, weakness:  - Not orthostatic-sounding symptoms, suspect weakness due to covid.  - PT/OT  Complete heart block : s/p PPM which appears to be functioning appropriately.   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   Unstageable coccyx pressure injury: POA - Offload as able.  - WOC consulted  Mild rhabdomyolysis: vs. covid-myopathy: With hx of fall at home, dipstick positive hgb w/o RBCs  on micro and AKI. CK was checked, modestly elevated.  - Has been given fluids, will trend to ensure diminution.   Obesity: BMI 38.   DVT prophylaxis: Heparin 7.5k u [place] q8h Code Status: Full Family Communication: Wife by phone this afternoon Disposition Plan: Uncertain, pending  clinical course.   Consultants:   None  Procedures:   None  Antimicrobials:  Vancomycin, aztreonam 9/19  Remdesivir 9/19 - 9/23   Subjective: Feels his breathing is improved from yesterday, no fevers or chest pain. No nausea or vomiting. If worsens, would want intubation/full efforts, etc. He doesn't really recall his ICU stay but was there from Jan - May 2014.   Objective: Vitals:   03/23/19 0000 03/23/19 0221 03/23/19 0354 03/23/19 0808  BP:  126/80 130/89 112/72  Pulse:  77 74 77  Resp:  20 (!) 21 19  Temp: 97.9 F (36.6 C) 97.9 F (36.6 C) 97.7 F (36.5 C) 97.6 F (36.4 C)  TempSrc: Oral   Oral  SpO2:   96% 94%  Weight: 104 kg 104 kg    Height: 5\' 5"  (1.651 m) 5\' 5"  (1.651 m)      Intake/Output Summary (Last 24 hours) at 03/23/2019 1049 Last data filed at 03/23/2019 0651 Gross per 24 hour  Intake -  Output 1250 ml  Net -1250 ml   Filed Weights   03/22/19 2031 03/23/19 0000 03/23/19 0221  Weight: 104.2 kg 104 kg 104 kg    Gen: Chronically ill-appearing male in no distress Pulm: Non-labored breathing supplemental oxygen. Crackles bilaterally R > L. CV: Regular rate and rhythm. No murmur, rub, or gallop. No JVD, no significant pedal edema. GI: Abdomen soft, non-tender, non-distended, with normoactive bowel sounds. No organomegaly or masses felt. Ext: Warm, no deformities Skin: No rashes, lesions or ulcers on visualized skin. Neuro: Alert and oriented. No focal neurological deficits. Psych: Judgement and insight appear normal. Mood & affect appropriate.   Data Reviewed: I have personally reviewed following labs and imaging studies  CBC: Recent Labs  Lab 03/23/19 0211  WBC 1.5*  NEUTROABS 1.2*  HGB 13.1  HCT 43.7  MCV 94.2  PLT 95*   Basic Metabolic Panel: Recent Labs  Lab 03/23/19 0211  NA 140  K 3.8  CL 107  CO2 23  GLUCOSE 198*  BUN 55*  CREATININE 1.84*  CALCIUM 8.1*   GFR: Estimated Creatinine Clearance: 37.9 mL/min (A) (by C-G  formula based on SCr of 1.84 mg/dL (H)). Liver Function Tests: Recent Labs  Lab 03/23/19 0211  AST 65*  ALT 38  ALKPHOS 63  BILITOT 0.7  PROT 6.1*  ALBUMIN 2.8*   Cardiac Enzymes: Recent Labs  Lab 03/23/19 0211  CKTOTAL 737*   CBG: Recent Labs  Lab 03/22/19 2018 03/23/19 0304 03/23/19 0807  GLUCAP 217* 197* 181*   Urine analysis:    Component Value Date/Time   COLORURINE YELLOW 03/22/2019 2127   APPEARANCEUR CLOUDY (A) 03/22/2019 2127   LABSPEC 1.013 03/22/2019 2127   PHURINE 5.0 03/22/2019 2127   GLUCOSEU NEGATIVE 03/22/2019 2127   HGBUR MODERATE (A) 03/22/2019 2127   BILIRUBINUR NEGATIVE 03/22/2019 2127   [place] NEGATIVE 03/22/2019 2127   PROTEINUR 30 (A) 03/22/2019 2127   UROBILINOGEN 0.2 07/11/2013 1345   NITRITE NEGATIVE 03/22/2019 2127   LEUKOCYTESUR MODERATE (A) 03/22/2019 2127   No results found for this or any previous visit (from the past 240 hour(s)).    Radiology Studies: No results found.  Scheduled Meds: . allopurinol  300 mg  Oral Daily  . atorvastatin  80 mg Oral QAC breakfast  . bisoprolol  2.5 mg Oral Daily  . calcitRIOL  0.25 mcg Oral Daily  . dexamethasone  6 mg Oral Q24H  . Gerhardt's butt cream   Topical BID  . heparin injection (subcutaneous)  7,500 Units Subcutaneous Q8H  . [START ON 03/25/2019] influenza vaccine adjuvanted  0.5 mL Intramuscular Tomorrow-1000  . insulin aspart  0-15 Units Subcutaneous TID WC  . insulin aspart  0-5 Units Subcutaneous QHS  . insulin glargine  20 Units Subcutaneous Daily  . Ipratropium-Albuterol  1 puff Inhalation Q6H  . pantoprazole  40 mg Oral QHS   Continuous Infusions: . remdesivir 100 mg in NS 250 mL       LOS: 1 day   Time spent: 35 minutes.  Patrecia Pour, MD [facility] [email or web address] Password [facility] 03/23/2019, 10:49 AM

## 2019-03-24 LAB — CBC WITH DIFFERENTIAL/PLATELET
Abs Immature Granulocytes: 0.02 10*3/uL (ref 0.00–0.07)
Basophils Absolute: 0 10*3/uL (ref 0.0–0.1)
Basophils Relative: 0 %
Eosinophils Absolute: 0 10*3/uL (ref 0.0–0.5)
Eosinophils Relative: 0 %
HCT: 44.5 % (ref 39.0–52.0)
Hemoglobin: 13.3 g/dL (ref 13.0–17.0)
Immature Granulocytes: 0 %
Lymphocytes Relative: 6 %
Lymphs Abs: 0.3 10*3/uL — ABNORMAL LOW (ref 0.7–4.0)
MCH: 28.4 pg (ref 26.0–34.0)
MCHC: 29.9 g/dL — ABNORMAL LOW (ref 30.0–36.0)
MCV: 94.9 fL (ref 80.0–100.0)
Monocytes Absolute: 0.3 10*3/uL (ref 0.1–1.0)
Monocytes Relative: 7 %
Neutro Abs: 4.3 10*3/uL (ref 1.7–7.7)
Neutrophils Relative %: 87 %
Platelets: 112 10*3/uL — ABNORMAL LOW (ref 150–400)
RBC: 4.69 MIL/uL (ref 4.22–5.81)
RDW: 16.9 % — ABNORMAL HIGH (ref 11.5–15.5)
WBC: 5 10*3/uL (ref 4.0–10.5)
nRBC: 0 % (ref 0.0–0.2)

## 2019-03-24 LAB — COMPREHENSIVE METABOLIC PANEL
ALT: 47 U/L — ABNORMAL HIGH (ref 0–44)
AST: 61 U/L — ABNORMAL HIGH (ref 15–41)
Albumin: 2.7 g/dL — ABNORMAL LOW (ref 3.5–5.0)
Alkaline Phosphatase: 66 U/L (ref 38–126)
Anion gap: 9 (ref 5–15)
BUN: 65 mg/dL — ABNORMAL HIGH (ref 8–23)
CO2: 24 mmol/L (ref 22–32)
Calcium: 8.9 mg/dL (ref 8.9–10.3)
Chloride: 109 mmol/L (ref 98–111)
Creatinine, Ser: 1.59 mg/dL — ABNORMAL HIGH (ref 0.61–1.24)
GFR calc Af Amer: 48 mL/min — ABNORMAL LOW (ref >60)
GFR calc non Af Amer: 42 mL/min — ABNORMAL LOW (ref >60)
Glucose, Bld: 268 mg/dL — ABNORMAL HIGH (ref 70–99)
Potassium: 3.6 mmol/L (ref 3.5–5.1)
Sodium: 142 mmol/L (ref 135–145)
Total Bilirubin: 0.8 mg/dL (ref 0.3–1.2)
Total Protein: 5.9 g/dL — ABNORMAL LOW (ref 6.5–8.1)

## 2019-03-24 LAB — CK: Total CK: 460 U/L — ABNORMAL HIGH (ref 49–397)

## 2019-03-24 LAB — GLUCOSE, CAPILLARY
Glucose-Capillary: 238 mg/dL — ABNORMAL HIGH (ref 70–99)
Glucose-Capillary: 362 mg/dL — ABNORMAL HIGH (ref 70–99)
Glucose-Capillary: 391 mg/dL — ABNORMAL HIGH (ref 70–99)
Glucose-Capillary: 321 mg/dL — ABNORMAL HIGH (ref 70–99)

## 2019-03-24 LAB — C-REACTIVE PROTEIN: CRP: 1.8 mg/dL — ABNORMAL HIGH (ref <1.0)

## 2019-03-24 LAB — FERRITIN: Ferritin: 973 ng/mL — ABNORMAL HIGH (ref 24–336)

## 2019-03-24 LAB — D-DIMER, QUANTITATIVE: D-Dimer, Quant: 1.45 ug/mL-FEU — ABNORMAL HIGH (ref 0.00–0.50)

## 2019-03-24 MED ORDER — TORSEMIDE 20 MG PO TABS
40.0000 mg | ORAL_TABLET | Freq: Two times a day (BID) | ORAL | Status: DC
  Administered 2019-03-25 – 2019-03-28 (×7): 40 mg via ORAL
  Filled 2019-03-24 (×9): qty 2

## 2019-03-24 MED ORDER — INSULIN GLARGINE 100 UNIT/ML ~~LOC~~ SOLN
5.0000 [IU] | Freq: Once | SUBCUTANEOUS | Status: AC
  Administered 2019-03-24: 5 [IU] via SUBCUTANEOUS
  Filled 2019-03-24: qty 0.05

## 2019-03-24 MED ORDER — INSULIN GLARGINE 100 UNIT/ML ~~LOC~~ SOLN
25.0000 [IU] | Freq: Every day | SUBCUTANEOUS | Status: DC
  Filled 2019-03-24: qty 0.25

## 2019-03-24 MED ORDER — INSULIN ASPART 100 UNIT/ML ~~LOC~~ SOLN
6.0000 [IU] | Freq: Three times a day (TID) | SUBCUTANEOUS | Status: DC
  Administered 2019-03-24 (×2): 6 [IU] via SUBCUTANEOUS

## 2019-03-24 NOTE — Evaluation (Addendum)
Occupational Therapy Evaluation Patient Details Name: [name] MRN: [id number] DOB: April 16, 1941 Today's Date: 03/24/2019    History of Present Illness 78 y.o. 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 Patient lives with daughter who has [employer]. Pt tested COVID +; Had mild confusion at time of presentation.  Also had fallen earlier in day due to generalized weakness. Mild rhabdomyolysis: vs. covid-myopathy. ?pressure area left gluteal cleft   Clinical Impression   Pt admitted with above diagnoses, generalized weakness, decreased activity tolerance and cognitive deficits limiting ability to engage in BADL at desired level of ind. Spoke with wife on phone for collateral. Wife shares pt was ind prior to admission; leading up to admission had increased confusion and weakness from O2 sats dropping, requiring EMS to get pt up from fall. At time of eval, cognitive deficits still noted as listed below. Will continue to assess while inpatient considering pt was ind prior with med management, etc. Pt seen on 4LO2 [place]. Pt completing sit <> stand with min guard A and walked to BR with RW with min A to complete toilet transfer and hygiene. During this time, pt with dyspnea 2/4 requiring VC's to slow RR down from 32. O2 sat probe giving variable readings. Finger probe reading mid 80s when walking and grasping RW.  Probe placed on ear reading 100, but poor pleth. Pediatric/neonate probe placed on R finger with SpO2 sats at 83. O2 increased to 5L with pt eventually returning to 89/90 after several minutes with good pleth.  Educated on IS and flutter, pt needing one step verbal and tactile cues to complete successfully. Currently recommending HHOT and 24/7 supervision for safety for d/c. Will continue to follow acutely per POC  listed below.     Follow Up Recommendations  Home health OT;Supervision/Assistance - 24 hour    Equipment Recommendations  3 in 1 bedside commode    Recommendations for Other Services       Precautions / Restrictions Precautions Precautions: Fall Restrictions Weight Bearing Restrictions: No      Mobility Bed Mobility Overal bed mobility: Needs Assistance Bed Mobility: Supine to Sit     Supine to sit: Min guard;HOB elevated     General bed mobility comments: up in chair upon arrival  Transfers Overall transfer level: Needs assistance Equipment used: Rolling walker (2 wheeled);None Transfers: Sit to/from Stand Sit to Stand: Min guard;Min assist         General transfer comment: vc's for safe use of RW, occasional min A for steadying/safety    Balance Overall balance assessment: Needs assistance Sitting-balance support: No upper extremity supported;Feet supported Sitting balance-Leahy Scale: Fair     Standing balance support: No upper extremity supported Standing balance-Leahy Scale: Poor Standing balance comment: min A to correct balance X2 going to BR                           ADL either performed or assessed with clinical judgement   ADL Overall ADL's : Needs assistance/impaired Eating/Feeding: Set up;Sitting   Grooming: Minimal assistance;Standing   Upper Body Bathing: Minimal assistance;Sitting   Lower Body Bathing: Sit to/from stand;Sitting/lateral leans;Maximal assistance   Upper Body Dressing : Minimal assistance;Sitting   Lower Body Dressing: Sitting/lateral leans;Sit to/from stand;Maximal assistance   Toilet Transfer: Minimal  assistance;Comfort height toilet;Grab bars;BSC;RW Toilet Transfer Details (indicate cue type and reason): min A for safety, pt unsteady and unaware of this Toileting- Clothing Manipulation and Hygiene: Minimal assistance;Sit to/from stand Toileting - Clothing Manipulation Details (indicate cue type and  reason): stnading to clean peri area prior to RN donning pink foam, pt needing min A for balance, then min A to complete task successfully Tub/ Shower Transfer: Minimal assistance;Shower seat;Ambulation;Rolling walker   Functional mobility during ADLs: Minimal assistance;Cueing for safety;Cueing for sequencing;Rolling walker General ADL Comments: pt overall ltd 2/2 decreased activity tol, cardiopulm status, and cognitive deficits     Vision Baseline Vision/History: Wears glasses Wears Glasses: At all times Patient Visual Report: No change from baseline       Perception     Praxis      Pertinent Vitals/Pain Pain Assessment: No/denies pain     Hand Dominance Right   Extremity/Trunk Assessment Upper Extremity Assessment Upper Extremity Assessment: Generalized weakness   Lower Extremity Assessment Lower Extremity Assessment: Defer to PT evaluation   Cervical / Trunk Assessment Cervical / Trunk Assessment: Other exceptions Cervical / Trunk Exceptions: obese   Communication Communication Communication: HOH   Cognition Arousal/Alertness: Awake/alert Behavior During Therapy: WFL for tasks assessed/performed Overall Cognitive Status: Impaired/Different from baseline Area of Impairment: Memory;Problem solving;Safety/judgement;Awareness                     Memory: Decreased short-term memory   Safety/Judgement: Decreased awareness of deficits;Decreased awareness of safety Awareness: Intellectual Problem Solving: Requires verbal cues;Difficulty sequencing General Comments: per wife, no cognitive concerns at baseline. Pt was confused PTA and wife reports 2/2 O2 sats dropping. At time of eval, pt with cognitive deficits above, attempting to get up on own per RN   General Comments       Exercises Exercises: Other exercises Other Exercises Other Exercises: standing mini-squats x 8 reps; vc to slow pace however pt did not, ultimately had to tell him to sit down to rest  to get sats to rebound   Shoulder Instructions      Home Living Family/patient expects to be discharged to:: Private residence Living Arrangements: Spouse/significant other Available Help at Discharge: Family;Available 24 hours/day(wife) Type of Home: House Home Access: Stairs to enter [employer] of Steps: 10-12 steps Entrance Stairs-Rails: Right;Left Home Layout: One level     Bathroom Shower/Tub: Occupational psychologist: Handicapped height Bathroom Accessibility: (pt unsure)   Home Equipment: Wheelchair - [employer] - 2 wheels   Additional Comments: home info above reflects pt home. Spoke with wife, returning to dtrs home with less stairs, more level, all BRs accessbile      Prior Functioning/Environment Level of Independence: Independent        Comments: recent falls; fell off bed at daughter's house - EMT had to come and transfer from floor        OT Problem List: Decreased knowledge of use of DME or AE;Decreased strength;Decreased knowledge of precautions;Obesity;Decreased activity tolerance;Decreased cognition;Cardiopulmonary status limiting activity;Impaired balance (sitting and/or standing);Decreased safety awareness      OT Treatment/Interventions: Self-care/ADL training;Therapeutic exercise;Patient/family education;Balance training;Energy conservation;Therapeutic activities;DME and/or AE instruction;Cognitive remediation/compensation    OT Goals(Current goals can be found in the care plan section) Acute Rehab OT Goals Patient Stated Goal: to get stronger OT Goal Formulation: With patient Time For Goal Achievement: 04/07/19 Potential to Achieve Goals: Good  OT Frequency: Min 2X/week   Barriers to D/C:  Co-evaluation              AM-PAC OT "6 Clicks" Daily Activity     Outcome Measure Help from another person eating meals?: None Help from another person taking care of personal grooming?: None Help from another  person toileting, which includes using toliet, bedpan, or urinal?: A Little Help from another person bathing (including washing, rinsing, drying)?: A Lot Help from another person to put on and taking off regular upper body clothing?: A Little Help from another person to put on and taking off regular lower body clothing?: A Lot 6 Click Score: 18   End of Session Equipment Utilized During Treatment: Gait belt;Rolling walker;Oxygen Nurse Communication: Mobility status  Activity Tolerance: Patient tolerated treatment well Patient left: in chair;with call bell/phone within reach;with chair alarm set  OT Visit Diagnosis: Other abnormalities of gait and mobility (R26.89);Muscle weakness (generalized) (M62.81)                Time: 6237-6283 OT Time Calculation (min): 54 min Charges:  OT General Charges $OT Visit: 1 Visit OT Evaluation $OT Eval Moderate Complexity: 1 Mod OT Treatments $Self Care/Home Management : 38-52 mins  Zenovia Jarred, MSOT, OTR/L Behavioral Health OT/ Acute Relief OT [facility] Office: [phone] [place] XTGGY:[phone]   Zenovia Jarred 03/24/2019, 4:25 PM  Note addendum Maurie Boettcher, OT/L   Acute OT Clinical Specialist Acute Rehabilitation Services Pager [phone] Office [phone]

## 2019-03-24 NOTE — Evaluation (Signed)
Physical Therapy Evaluation Patient Details Name: [name] MRN: [id number] DOB: Nov 09, 1940 Today's Date: 03/24/2019   History of Present Illness  [name] is a 78 y.o. 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 Pt lives with dtr whom is COVID positive. Was admitted with SOB, fall grom generalized weakness.  Clinical Impression   Pt admitted with above diagnosis. Patient with mild confusion (per RN worse this pm than this am). Became very dyspneic with sats decr to 83% on 4L with walking 40 ft. Patient with poor awareness with continuing to talk despite incr RR. Required max cues to slow breathing and 4-5 minutes to recover sats to 89-90%. *Note-using Nelcor forehead probe which consistently read 4-5% lower than regular earlobe probe (reading 88-96%).  Pt currently with functional limitations due to the deficits listed below (see PT Problem List). Pt will benefit from skilled PT to increase their independence and safety with mobility to allow discharge to the venue listed below.       Follow Up Recommendations Home health PT;Supervision/Assistance - 24 hour    Equipment Recommendations  None recommended by PT    Recommendations for Other Services       Precautions / Restrictions Precautions Precautions: Fall Restrictions Weight Bearing Restrictions: No      Mobility  Bed Mobility Overal bed mobility: Needs Assistance Bed Mobility: Supine to Sit     Supine to sit: Min guard;HOB elevated     General bed mobility comments: for safety due to recent fall from bed (at home); incr time/effort  Transfers Overall transfer level: Needs assistance Equipment used: Rolling walker (2 wheeled);None Transfers: Sit to/from Stand Sit to Stand: Min guard         General transfer comment: vc for safe use of  RW  Ambulation/Gait Ambulation/Gait assistance: Min guard Gait Distance (Feet): 40 Feet Assistive device: Rolling walker (2 wheeled) Gait Pattern/deviations: Step-through pattern;Decreased stride length Gait velocity: slow   General Gait Details: on 4L with sats dropping to 83%, RR 34 with pt still talking and required seated rest, vc for pursed lip breathing and 5 minutes to achieve 89-90% (on 4L)  Stairs            Wheelchair Mobility    Modified Rankin (Stroke Patients Only)       Balance Overall balance assessment: Needs assistance Sitting-balance support: No upper extremity supported;Feet supported Sitting balance-Leahy Scale: Fair     Standing balance support: No upper extremity supported Standing balance-Leahy Scale: Fair Standing balance comment: stood for 30 seconds without UE support and no imbalance (static standing)                             Pertinent Vitals/Pain Pain Assessment: No/denies pain    Home Living Family/patient expects to be discharged to:: Private residence Living Arrangements: Spouse/significant other Available Help at Discharge: Family;Available 24 hours/day(wife) Type of Home: House Home Access: Stairs to enter Entrance Stairs-Rails: Psychiatric nurse of Steps: 10-12 steps Home Layout: One level Home Equipment: Wheelchair - [employer] - 2 wheels Additional Comments: home information above reflects pt home. Per OT, they spoke with wife who thinks they will return to dtrs home which has less stairs, is more level, all BRs accessible    Prior Function Level of Independence: Independent  Comments: recent falls; fell off bed at daughter's house - EMT had to come and transfer from floor     Hand Dominance   Dominant Hand: Right    Extremity/Trunk Assessment   Upper Extremity Assessment Upper Extremity Assessment: Defer to OT evaluation    Lower Extremity Assessment Lower Extremity  Assessment: Generalized weakness    Cervical / Trunk Assessment Cervical / Trunk Assessment: Other exceptions Cervical / Trunk Exceptions: obese  Communication   Communication: HOH  Cognition Arousal/Alertness: Awake/alert Behavior During Therapy: WFL for tasks assessed/performed Overall Cognitive Status: Impaired/Different from baseline Area of Impairment: Memory;Problem solving;Safety/judgement;Awareness                     Memory: Decreased short-term memory   Safety/Judgement: Decreased awareness of deficits;Decreased awareness of safety(Per RN, trying to get up on his own) Awareness: Intellectual Problem Solving: Requires verbal cues;Difficulty sequencing General Comments: per RN, pt's cognition has worsened this pm compared to a.m. She also noted she has been titrating O2 down and now at 3L (from 6L)      General Comments      Exercises Other Exercises Other Exercises: standing mini-squats x 8 reps; vc to slow pace however pt did not, ultimately had to tell him to sit down to rest to get sats to rebound   Assessment/Plan    PT Assessment Patient needs continued PT services  PT Problem List Decreased strength;Decreased activity tolerance;Decreased balance;Decreased mobility;Decreased cognition;Decreased knowledge of use of DME;Decreased safety awareness;Cardiopulmonary status limiting activity;Obesity       PT Treatment Interventions DME instruction;Gait training;Functional mobility training;Therapeutic activities;Therapeutic exercise;Balance training;Neuromuscular re-education;Cognitive remediation;Patient/family education    PT Goals (Current goals can be found in the Care Plan section)  Acute Rehab PT Goals Patient Stated Goal: get off the oxygen PT Goal Formulation: With patient Time For Goal Achievement: 04/07/19 Potential to Achieve Goals: Good    Frequency Min 3X/week   Barriers to discharge        Co-evaluation               AM-PAC PT  "6 Clicks" Mobility  Outcome Measure Help needed turning from your back to your side while in a flat bed without using bedrails?: None Help needed moving from lying on your back to sitting on the side of a flat bed without using bedrails?: A Little Help needed moving to and from a bed to a chair (including a wheelchair)?: A Little Help needed standing up from a chair using your arms (e.g., wheelchair or bedside chair)?: A Little Help needed to walk in hospital room?: A Little Help needed climbing 3-5 steps with a railing? : A Lot 6 Click Score: 18    End of Session Equipment Utilized During Treatment: Oxygen;Gait belt Activity Tolerance: Treatment limited secondary to medical complications (Comment)(desats) Patient left: in chair;with call bell/phone within reach;with chair alarm set Nurse Communication: Mobility status;Other (comment)(forehead nelcor probe reading 4-5% lower than earlobe) PT Visit Diagnosis: Difficulty in walking, not elsewhere classified (R26.2);Unsteadiness on feet (R26.81)    Time: 1435-1530 PT Time Calculation (min) (ACUTE ONLY): 55 min   Charges:   PT Evaluation $PT Eval Moderate Complexity: 1 Mod PT Treatments $Gait Training: 8-22 mins $Therapeutic Activity: 8-22 mins $Self Care/Home Management: 8-22          Barry Brunner, PT      Rexanne Mano 03/24/2019, 3:58 PM

## 2019-03-24 NOTE — Progress Notes (Signed)
Inpatient Diabetes Program Recommendations  AACE/ADA: New Consensus Statement on Inpatient Glycemic Control (2015)  Target Ranges:  Prepandial:   less than 140 mg/dL      Peak postprandial:   less than 180 mg/dL (1-2 hours)      Critically ill patients:  140 - 180 mg/dL   Results for HAWKEN, BIELBY (MRN [id number]) as of 03/24/2019 10:42  Ref. Range 03/23/2019 08:07 03/23/2019 11:27 03/23/2019 16:39 03/23/2019 20:44 03/23/2019 21:35  Glucose-Capillary Latest Ref Range: 70 - 99 mg/dL 181 (H)  3 units NOVOLOG  252 (H)  8 units NOVOLOG +  20 units LANTUS  297 (H)  8 units NOVOLOG  274 (H) 291 (H)  3 units NOVOLOG    Results for EUEL, CASTILE (MRN [id number]) as of 03/24/2019 10:42  Ref. Range 03/24/2019 07:35  Glucose-Capillary Latest Ref Range: 70 - 99 mg/dL 238 (H)  5 units NOVOLOG +  20 units LANTUS    Admit with: COVID 19  History: DM, CHF, CKD, COPD  Home DM Meds: Tresiba 30 units Daily       Ozempic 1 mg QSunday  Current Orders: Lantus 20 units Daily      Novolog Moderate Correction Scale/ SSI (0-15 units) TID AC + HS      Getting Decadron 6 mg daily.  CBGs >200 mg/dl.    MD- Please consider the following in-hospital insulin adjustments:  1. Increase Lantus to 25 units Daily  2. Start Novolog Meal Coverage: Novolog 6 units TID with meals  (Please add the following Hold Parameters: Hold if pt eats <50% of meal, Hold if pt NPO)     --Will follow patient during hospitalization--  Wyn Quaker RN, MSN, CDE Diabetes Coordinator Inpatient Glycemic Control Team Team Pager: [phone] (8a-5p)

## 2019-03-24 NOTE — Progress Notes (Signed)
PT Cancellation Note  Patient Details Name: [name] MRN: [id number] DOB: 10-19-1940   Cancelled Treatment:    Reason Eval/Treat Not Completed: Patient at procedure or test/unavailable  Patient working with OT. Will attempt later today    Barry Brunner, PT      [name] 03/24/2019, 10:49 AM

## 2019-03-24 NOTE — Progress Notes (Addendum)
PROGRESS NOTE  [name]  RDE:[id number] DOB: 04-08-1941 DOA: 03/22/2019 PCP: [name], MD   Brief Narrative: [name] is a 78 y.o. 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 He presented to [place] 9/19 from home where his daughter stays and is covid-positive, with a day of SOB, weakness, having fallen at home and appearing mildly confused. On evaluation he was hypoxic requiring 10L HFNC, SARS-CoV-2 PCR positive, CXR with RML, RLL infiltrates, CRP and d-dimer elevated, PCT negative. Solumedrol, vancomycin, and aztreonam with 1L IVF administered and patient was admitted to [facility], started on remdesivir.  Assessment & Plan: Principal Problem:   Pneumonia due to COVID-19 virus Active Problems:   Acute respiratory failure with hypoxia (HCC)   CAD (coronary artery disease)   Poorly controlled type 2 diabetes mellitus with circulatory disorder (HCC)   AKI (acute kidney injury) (HCC)   Chronic diastolic heart failure (HCC)   CKD (chronic kidney disease) stage 3, GFR 30-59 ml/min (HCC)   Sacral decubitus ulcer  Acute hypoxic respiratory failure due to [id number] pneumonia: Certainly at high risk of decompensation/progression to MODS/ARDS. Received vancomycin, aztreonam in ED though with covid proven and negative PCT, do not suspect bacterial sepsis at this time.  - Continue supplemental oxygen as needed to maintain SpO2 >90%. Requires close monitoring in PCU. Weaned to 4L from 6L O2 in pt w/hx ARDS due to H1N1.  - Started remdesivir, continue 9/19 - 9/23 - Continue decadron - Monitor for decompensation and consideration of convalescent plasma.  - Continue airborne, contact precautions. PPE including surgical gown, gloves, cap, shoe covers, and CAPR used during this encounter in a negative pressure room.  - Check daily  labs. CRP improving, remains elevated. - Intermediate dose heparin due to elevated D-dimer (slightly improving) and acuity of illness. - Maintain euvolemia/net negative.  - Avoid NSAIDs - Recommend proning and aggressive use of incentive spirometry. - Goals of care were discussed. Prognosis is guarded.   LFT elevation: Modest.  - Will continue monitoring with remdesivir.  AKI on stage III CKD: Suspect prerenal injury, has continued improvement back to baseline. Cr 2.2 down to 1.84 then 1.59 following IVF, from a baseline of 1.6. Did receive vancomycin in ED.  - Check daily.  - Avoid hypotension and nephrotoxins.   Thrombocytopenia: Chronic, dating back to 2017. Stable/slightly improved again today. Ok to use DVT ppx given high risk and stable/normal hgb.   Leukopenia: Due to covid viral infection most likely. Resolved. - Continue monitoring  IDT2DM:  - He takes 30u tresiba daily at home. Started on 20u daily, will give 5 additional units and increase to 25u tomorrow AM, cover w/mealtime insulin and continue mod SSI AC/HS due to persistently elevated CBGs.  CAD s/p CABG: No chest pain, troponin 0.08, paced rhythm without new ischemic changes on ECG.  - EDP discussed with cardiology who recommended no further work up at this time - Continue beta blocker, ASA, statin  Chronic HFpEF: Appears euvolemic - Given IVF. Will stop now that he's taking po. Holding demadex 40mg  BID again today, will order to begin 9/22.  Fall at home, weakness:  - Not orthostatic-sounding symptoms, suspect weakness due to covid.  - PT/OT  Complete heart block : s/p PPM which appears to be functioning appropriately.   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   Unstageable coccyx pressure injury: POA - Offload as able.  - WOC consulted, instituting Gerhart's butt cream TID.   Mild rhabdomyolysis: vs. covid-myopathy: With hx of fall at home, dipstick positive hgb w/o RBCs on micro and AKI.  CK was checked, modestly elevated.  - Has been given fluids with improvement.   Obesity: BMI 38.   DVT prophylaxis: Heparin 7.5k u [place] q8h Code Status: Full Family Communication: None at bedside Disposition Plan: Uncertain, pending clinical course.   Consultants:   None  Procedures:   None  Antimicrobials:  Vancomycin, aztreonam 9/19  Remdesivir 9/19 - 9/23   Subjective: Feels short of breath with any exertion, getting up to [facility] without chest pain. No other complaints. No leg swelling.   Objective: Vitals:   03/24/19 0400 03/24/19 0800 03/24/19 0815 03/24/19 1025  BP: 112/66     Pulse: 83  93 89  Resp: 15  20 20   Temp: 98 F (36.7 C) 98 F (36.7 C)    TempSrc: Oral     SpO2: 92%  91% 93%  Weight:      Height:        Intake/Output Summary (Last 24 hours) at 03/24/2019 1135 Last data filed at 03/24/2019 1025 Gross per 24 hour  Intake 1210 ml  Output 975 ml  Net 235 ml   Filed Weights   03/22/19 2031 03/23/19 0000 03/23/19 0221  Weight: 104.2 kg 104 kg 104 kg   Gen: Pleasant, chronically ill-appearing male in no distress Pulm: Nonlabored breathing supplemental oxygen. Crackles stable R > L. CV: Regular rate and rhythm. No murmur, rub, or gallop. No JVD, no dependent edema. GI: Abdomen soft, non-tender, non-distended, with normoactive bowel sounds.  Ext: Warm, no deformities Skin: No rashes, lesions or ulcers on visualized skin. Neuro: Alert and oriented. No focal neurological deficits. Psych: Judgement and insight appear fair. Mood euthymic & affect congruent. Behavior is appropriate.    Data Reviewed: I have personally reviewed following labs and imaging studies  CBC: Recent Labs  Lab 03/23/19 0211 03/24/19 0345  WBC 1.5* 5.0  NEUTROABS 1.2* 4.3  HGB 13.1 13.3  HCT 43.7 44.5  MCV 94.2 94.9  PLT 95* 244*   Basic Metabolic Panel: Recent Labs  Lab 03/23/19 0211 03/24/19 0345  NA 140 142  K 3.8 3.6  CL 107 109  CO2 23 24  GLUCOSE 198* 268*   BUN 55* 65*  CREATININE 1.84* 1.59*  CALCIUM 8.1* 8.9   GFR: Estimated Creatinine Clearance: 43.9 mL/min (A) (by C-G formula based on SCr of 1.59 mg/dL (H)). Liver Function Tests: Recent Labs  Lab 03/23/19 0211 03/24/19 0345  AST 65* 61*  ALT 38 47*  ALKPHOS 63 66  BILITOT 0.7 0.8  PROT 6.1* 5.9*  ALBUMIN 2.8* 2.7*   Cardiac Enzymes: Recent Labs  Lab 03/23/19 0211 03/24/19 0345  CKTOTAL 737* 460*   CBG: Recent Labs  Lab 03/23/19 1127 03/23/19 1639 03/23/19 2044 03/23/19 2135 03/24/19 0735  GLUCAP 252* 297* 274* 291* 238*   Urine analysis:    Component Value Date/Time   COLORURINE YELLOW 03/22/2019 2127   APPEARANCEUR CLOUDY (A) 03/22/2019 2127   LABSPEC 1.013 03/22/2019 2127   PHURINE 5.0 03/22/2019 2127   GLUCOSEU NEGATIVE 03/22/2019 2127   HGBUR MODERATE (A) 03/22/2019 2127   BILIRUBINUR NEGATIVE 03/22/2019 2127   [place] 03/22/2019 2127   PROTEINUR 30 (A) 03/22/2019 2127   UROBILINOGEN 0.2 07/11/2013 1345   NITRITE NEGATIVE 03/22/2019 2127   LEUKOCYTESUR  MODERATE (A) 03/22/2019 2127   No results found for this or any previous visit (from the past 240 hour(s)).    Radiology Studies: No results found.  Scheduled Meds: . allopurinol  300 mg Oral Daily  . aspirin EC  81 mg Oral Daily  . atorvastatin  80 mg Oral QAC breakfast  . bisoprolol  2.5 mg Oral Daily  . calcitRIOL  0.25 mcg Oral Daily  . dexamethasone  6 mg Oral Q24H  . Gerhardt's butt cream   Topical TID  . heparin injection (subcutaneous)  7,500 Units Subcutaneous Q8H  . [START ON 03/25/2019] influenza vaccine adjuvanted  0.5 mL Intramuscular Tomorrow-1000  . insulin aspart  0-15 Units Subcutaneous TID WC  . insulin aspart  0-5 Units Subcutaneous QHS  . insulin aspart  6 Units Subcutaneous TID WC  . [START ON 03/25/2019] insulin glargine  25 Units Subcutaneous Daily  . insulin glargine  5 Units Subcutaneous Once  . Ipratropium-Albuterol  1 puff Inhalation Q6H  . pantoprazole   40 mg Oral QHS   Continuous Infusions: . remdesivir 100 mg in NS 250 mL Stopped (03/23/19 2252)     LOS: 2 days   Time spent: 35 minutes.  Patrecia Pour, MD [facility] [email or web address] Password [facility] 03/24/2019, 11:35 AM

## 2019-03-25 LAB — COMPREHENSIVE METABOLIC PANEL
ALT: 49 U/L — ABNORMAL HIGH (ref 0–44)
AST: 52 U/L — ABNORMAL HIGH (ref 15–41)
Albumin: 2.7 g/dL — ABNORMAL LOW (ref 3.5–5.0)
Alkaline Phosphatase: 74 U/L (ref 38–126)
Anion gap: 9 (ref 5–15)
BUN: 56 mg/dL — ABNORMAL HIGH (ref 8–23)
CO2: 26 mmol/L (ref 22–32)
Calcium: 9.1 mg/dL (ref 8.9–10.3)
Chloride: 108 mmol/L (ref 98–111)
Creatinine, Ser: 1.39 mg/dL — ABNORMAL HIGH (ref 0.61–1.24)
GFR calc Af Amer: 57 mL/min — ABNORMAL LOW (ref >60)
GFR calc non Af Amer: 49 mL/min — ABNORMAL LOW (ref >60)
Glucose, Bld: 238 mg/dL — ABNORMAL HIGH (ref 70–99)
Potassium: 3.7 mmol/L (ref 3.5–5.1)
Sodium: 143 mmol/L (ref 135–145)
Total Bilirubin: 0.7 mg/dL (ref 0.3–1.2)
Total Protein: 6.2 g/dL — ABNORMAL LOW (ref 6.5–8.1)

## 2019-03-25 LAB — CBC WITH DIFFERENTIAL/PLATELET
Abs Immature Granulocytes: 0.04 10*3/uL (ref 0.00–0.07)
Basophils Absolute: 0 10*3/uL (ref 0.0–0.1)
Basophils Relative: 0 %
Eosinophils Absolute: 0 10*3/uL (ref 0.0–0.5)
Eosinophils Relative: 0 %
HCT: 44.1 % (ref 39.0–52.0)
Hemoglobin: 13.2 g/dL (ref 13.0–17.0)
Immature Granulocytes: 1 %
Lymphocytes Relative: 5 %
Lymphs Abs: 0.3 10*3/uL — ABNORMAL LOW (ref 0.7–4.0)
MCH: 28.1 pg (ref 26.0–34.0)
MCHC: 29.9 g/dL — ABNORMAL LOW (ref 30.0–36.0)
MCV: 93.8 fL (ref 80.0–100.0)
Monocytes Absolute: 0.3 10*3/uL (ref 0.1–1.0)
Monocytes Relative: 5 %
Neutro Abs: 5.3 10*3/uL (ref 1.7–7.7)
Neutrophils Relative %: 89 %
Platelets: 123 10*3/uL — ABNORMAL LOW (ref 150–400)
RBC: 4.7 MIL/uL (ref 4.22–5.81)
RDW: 16.8 % — ABNORMAL HIGH (ref 11.5–15.5)
WBC: 5.9 10*3/uL (ref 4.0–10.5)
nRBC: 0 % (ref 0.0–0.2)

## 2019-03-25 LAB — GLUCOSE, CAPILLARY
Glucose-Capillary: 217 mg/dL — ABNORMAL HIGH (ref 70–99)
Glucose-Capillary: 291 mg/dL — ABNORMAL HIGH (ref 70–99)
Glucose-Capillary: 411 mg/dL — ABNORMAL HIGH (ref 70–99)
Glucose-Capillary: 371 mg/dL — ABNORMAL HIGH (ref 70–99)

## 2019-03-25 LAB — D-DIMER, QUANTITATIVE: D-Dimer, Quant: 0.96 ug/mL-FEU — ABNORMAL HIGH (ref 0.00–0.50)

## 2019-03-25 LAB — FERRITIN: Ferritin: 1151 ng/mL — ABNORMAL HIGH (ref 24–336)

## 2019-03-25 LAB — CK: Total CK: 291 U/L (ref 49–397)

## 2019-03-25 LAB — C-REACTIVE PROTEIN: CRP: 1.5 mg/dL — ABNORMAL HIGH (ref <1.0)

## 2019-03-25 MED ORDER — INSULIN ASPART 100 UNIT/ML ~~LOC~~ SOLN
8.0000 [IU] | Freq: Three times a day (TID) | SUBCUTANEOUS | Status: DC
  Administered 2019-03-25 (×2): 8 [IU] via SUBCUTANEOUS

## 2019-03-25 MED ORDER — INSULIN ASPART 100 UNIT/ML ~~LOC~~ SOLN
10.0000 [IU] | Freq: Three times a day (TID) | SUBCUTANEOUS | Status: DC
  Administered 2019-03-25 – 2019-03-27 (×6): 10 [IU] via SUBCUTANEOUS

## 2019-03-25 MED ORDER — INSULIN ASPART 100 UNIT/ML ~~LOC~~ SOLN
0.0000 [IU] | Freq: Every day | SUBCUTANEOUS | Status: DC
  Administered 2019-03-25: 5 [IU] via SUBCUTANEOUS
  Administered 2019-03-26: 21:00:00 2 [IU] via SUBCUTANEOUS
  Administered 2019-03-27 – 2019-03-29 (×3): 3 [IU] via SUBCUTANEOUS
  Administered 2019-03-30: 4 [IU] via SUBCUTANEOUS

## 2019-03-25 MED ORDER — INSULIN GLARGINE 100 UNIT/ML ~~LOC~~ SOLN
35.0000 [IU] | Freq: Every day | SUBCUTANEOUS | Status: DC
  Administered 2019-03-25: 35 [IU] via SUBCUTANEOUS
  Filled 2019-03-25 (×2): qty 0.35

## 2019-03-25 MED ORDER — INSULIN ASPART 100 UNIT/ML ~~LOC~~ SOLN
0.0000 [IU] | Freq: Three times a day (TID) | SUBCUTANEOUS | Status: DC
  Administered 2019-03-25: 20 [IU] via SUBCUTANEOUS
  Administered 2019-03-25: 7 [IU] via SUBCUTANEOUS
  Administered 2019-03-25: 11 [IU] via SUBCUTANEOUS
  Administered 2019-03-26: 15 [IU] via SUBCUTANEOUS
  Administered 2019-03-26: 3 [IU] via SUBCUTANEOUS
  Administered 2019-03-26 – 2019-03-28 (×3): 15 [IU] via SUBCUTANEOUS
  Administered 2019-03-28 – 2019-03-29 (×2): 7 [IU] via SUBCUTANEOUS
  Administered 2019-03-29: 15 [IU] via SUBCUTANEOUS
  Administered 2019-03-30: 12:00:00 7 [IU] via SUBCUTANEOUS
  Administered 2019-03-30: 20 [IU] via SUBCUTANEOUS
  Administered 2019-03-31: 17:00:00 11 [IU] via SUBCUTANEOUS
  Administered 2019-03-31: 12:00:00 7 [IU] via SUBCUTANEOUS
  Administered 2019-04-01: 12:00:00 11 [IU] via SUBCUTANEOUS

## 2019-03-25 NOTE — Plan of Care (Signed)
No acute changes this shift. Replaced occluded IV. Improving exercise tolerance. VSS. Fall precautions maintained. Call light in reach.  Problem: Education: Goal: Knowledge of risk factors and measures for prevention of condition will improve Outcome: Progressing   Problem: Coping: Goal: Psychosocial and spiritual needs will be supported Outcome: Progressing   Problem: Respiratory: Goal: Will maintain a patent airway Outcome: Progressing Goal: Complications related to the disease process, condition or treatment will be avoided or minimized Outcome: Progressing

## 2019-03-25 NOTE — Progress Notes (Signed)
PROGRESS NOTE  [name]  DZH:[id number] DOB: 1940/09/21 DOA: 03/22/2019 PCP: [name], MD   Brief Narrative: [name] is a 78 y.o. 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 He presented to [place] 9/19 from home where his daughter stays and is covid-positive, with a day of SOB, weakness, having fallen at home and appearing mildly confused. On evaluation he was hypoxic requiring 10L HFNC, SARS-CoV-2 PCR positive, CXR with RML, RLL infiltrates, CRP and d-dimer elevated, PCT negative. Solumedrol, vancomycin, and aztreonam with 1L IVF administered and patient was admitted to [facility], started on remdesivir.  Assessment & Plan: Principal Problem:   Pneumonia due to COVID-19 virus Active Problems:   Acute respiratory failure with hypoxia (HCC)   CAD (coronary artery disease)   Poorly controlled type 2 diabetes mellitus with circulatory disorder (HCC)   AKI (acute kidney injury) (HCC)   Chronic diastolic heart failure (HCC)   CKD (chronic kidney disease) stage 3, GFR 30-59 ml/min (HCC)   Sacral decubitus ulcer  Acute hypoxic respiratory failure due to [id number]-22 pneumonia: Certainly at high risk of decompensation/progression to MODS/ARDS. Received vancomycin, aztreonam in ED though with covid proven and negative PCT, do not suspect bacterial sepsis at this time.  - Continue supplemental oxygen as needed to maintain SpO2 >90%, remains on 6L. Requires close monitoring in PCU in pt w/hx ARDS due to H1N1.  - Started remdesivir, continue 9/19 - 9/23 - Continue decadron - Monitor for decompensation and consideration of convalescent plasma.  - Continue airborne, contact precautions. PPE including surgical gown, gloves, cap, shoe covers, and CAPR used during this encounter in a negative pressure room.  - Check daily labs. CRP  continues improvement. - Intermediate dose heparin due to elevated D-dimer (improving) and acuity of illness. - Maintain euvolemia/net negative.  - Avoid NSAIDs - Recommend proning and aggressive use of incentive spirometry. - Goals of care were discussed. Prognosis is guarded.   LFT elevation: Modest, essentially stable. - Will continue monitoring with remdesivir.  AKI on stage III CKD: Suspect prerenal injury, has continued improvement back to baseline. Cr 2.2 and continues to improve better than suspected baseline of 1.6. Did receive vancomycin in ED.  - Check daily.  - Avoid hypotension and nephrotoxins.   Thrombocytopenia: Chronic, dating back to 2017. Stable/slightly improved again today. Ok to use DVT ppx given high risk and stable/normal hgb.   Lymphopenia: Due to covid viral infection most likely.  - Continue monitoring  IDT2DM:  - He takes 30u tresiba daily at home. Started lower doses and has had hyperglycemia so will augment basal and bolus.  CAD s/p CABG: No chest pain, troponin 0.08, paced rhythm without new ischemic changes on ECG.  - EDP discussed with cardiology who recommended no further work up at this time - Continue beta blocker, ASA, statin  Chronic HFpEF: Appears euvolemic - Restart home demadex 40mg  BID now that renal function stabilized and taking normal po.  Fall at home, weakness:  - Not orthostatic-sounding symptoms, suspect weakness due to covid.  - PT/OT evaluations ongoing, wife arranging 24 hr home care.  Complete heart block : s/p PPM which appears to be functioning appropriately.   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   Unstageable coccyx pressure injury: POA - Offload as able.  - WOC consulted, instituting Gerhart's butt  cream TID.   Mild rhabdomyolysis: vs. covid-myopathy: With hx of fall at home, dipstick positive hgb w/o RBCs on micro and AKI. CK was checked, modestly elevated.  - Has been given fluids with  resolution of CK elevation.  Obesity: BMI 38.   DVT prophylaxis: Heparin 7.5k u [place] q8h Code Status: Full Family Communication: Wife by phone daily. Disposition Plan: Uncertain, pending clinical course.   Consultants:   None  Procedures:   None  Antimicrobials:  Vancomycin, aztreonam 9/19  Remdesivir 9/19 - 9/23   Subjective: Exhibiting significant confusion without agitation. Denies shortness of breath at all, though once questioned he does admit to shortness of breath to the point he doesn't want to get up. Also has no chest pain or leg swelling.   Objective: Vitals:   03/25/19 0009 03/25/19 0413 03/25/19 0842 03/25/19 1607  BP: 127/90 138/89 123/85 123/81  Pulse: 84   98  Resp:  20 (!) 24 18  Temp: 97.8 F (36.6 C) (!) 97.5 F (36.4 C) (!) 97.5 F (36.4 C) 97.6 F (36.4 C)  TempSrc: Oral Oral Oral Oral  SpO2: 92%   91%  Weight:      Height:        Intake/Output Summary (Last 24 hours) at 03/25/2019 1615 Last data filed at 03/25/2019 1301 Gross per 24 hour  Intake 710 ml  Output 600 ml  Net 110 ml   Filed Weights   03/22/19 2031 03/23/19 0000 03/23/19 0221  Weight: 104.2 kg 104 kg 104 kg   Gen: Chronically ill-appearing male in no distress Pulm: Nonlabored breathing supplemental oxygen. Crackles stable R > L. CV: Regular rate and rhythm. No murmur, rub, or gallop. No JVD, no dependent edema. GI: Abdomen soft, non-tender, non-distended, with normoactive bowel sounds.  Ext: Warm, no deformities Skin: No rashes, lesions or ulcers on visualized skin. Neuro: Alert and oriented. No focal neurological deficits. Psych: Judgement and insight appear impaired. Mood euthymic & affect congruent. Behavior is appropriate.    Data Reviewed: I have personally reviewed following labs and imaging studies  CBC: Recent Labs  Lab 03/23/19 0211 03/24/19 0345 03/25/19 0240  WBC 1.5* 5.0 5.9  NEUTROABS 1.2* 4.3 5.3  HGB 13.1 13.3 13.2  HCT 43.7 44.5 44.1  MCV 94.2  94.9 93.8  PLT 95* 112* 025*   Basic Metabolic Panel: Recent Labs  Lab 03/23/19 0211 03/24/19 0345 03/25/19 0240  NA 140 142 143  K 3.8 3.6 3.7  CL 107 109 108  CO2 23 24 26   GLUCOSE 198* 268* 238*  BUN 55* 65* 56*  CREATININE 1.84* 1.59* 1.39*  CALCIUM 8.1* 8.9 9.1   GFR: Estimated Creatinine Clearance: 50.2 mL/min (A) (by C-G formula based on SCr of 1.39 mg/dL (H)). Liver Function Tests: Recent Labs  Lab 03/23/19 0211 03/24/19 0345 03/25/19 0240  AST 65* 61* 52*  ALT 38 47* 49*  ALKPHOS 63 66 74  BILITOT 0.7 0.8 0.7  PROT 6.1* 5.9* 6.2*  ALBUMIN 2.8* 2.7* 2.7*   Cardiac Enzymes: Recent Labs  Lab 03/23/19 0211 03/24/19 0345 03/25/19 0240  CKTOTAL 737* 460* 291   CBG: Recent Labs  Lab 03/24/19 1141 03/24/19 1552 03/24/19 2109 03/25/19 0834 03/25/19 1206  GLUCAP 362* 391* 321* 217* 291*   Urine analysis:    Component Value Date/Time   COLORURINE YELLOW 03/22/2019 2127   APPEARANCEUR CLOUDY (A) 03/22/2019 2127   LABSPEC 1.013 03/22/2019 2127   PHURINE 5.0 03/22/2019 2127   GLUCOSEU NEGATIVE 03/22/2019 2127  HGBUR MODERATE (A) 03/22/2019 2127   BILIRUBINUR NEGATIVE 03/22/2019 2127   [place] NEGATIVE 03/22/2019 2127   PROTEINUR 30 (A) 03/22/2019 2127   UROBILINOGEN 0.2 07/11/2013 1345   NITRITE NEGATIVE 03/22/2019 2127   LEUKOCYTESUR MODERATE (A) 03/22/2019 2127   No results found for this or any previous visit (from the past 240 hour(s)).    Radiology Studies: No results found.  Scheduled Meds: . allopurinol  300 mg Oral Daily  . aspirin EC  81 mg Oral Daily  . atorvastatin  80 mg Oral QAC breakfast  . bisoprolol  2.5 mg Oral Daily  . calcitRIOL  0.25 mcg Oral Daily  . dexamethasone  6 mg Oral Q24H  . Gerhardt's butt cream   Topical TID  . heparin injection (subcutaneous)  7,500 Units Subcutaneous Q8H  . insulin aspart  0-20 Units Subcutaneous TID WC  . insulin aspart  0-5 Units Subcutaneous QHS  . insulin aspart  8 Units Subcutaneous  TID WC  . insulin glargine  35 Units Subcutaneous Daily  . Ipratropium-Albuterol  1 puff Inhalation Q6H  . pantoprazole  40 mg Oral QHS  . torsemide  40 mg Oral BID   Continuous Infusions: . remdesivir 100 mg in NS 250 mL Stopped (03/24/19 1723)     LOS: 3 days   Time spent: 35 minutes.  Patrecia Pour, MD [facility] [email or web address] Password [facility] 03/25/2019, 4:15 PM

## 2019-03-25 NOTE — Progress Notes (Signed)
Physical Therapy Treatment Patient Details Name: ESLI JERNIGAN MRN: [id number] DOB: 1940-10-03 Today's Date: 03/25/2019    History of Present Illness 78 y.o. 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 Patient lives with daughter who has [place]. Pt tested COVID +; Had mild confusion at time of presentation.  Also had fallen earlier in day due to generalized weakness. Mild rhabdomyolysis: vs. covid-myopathy. ?pressure area left gluteal cleft    PT Comments    Patient remains confused--on entering room, pt lying in bed with all electrodes and pulse ox pulled off and cords wrapped under his body; urine on the floor around [facility] as well as in [facility]. Pt denied getting up alone, however looked as if he did. Patient with poor safety awareness demonstrated at end of session when he verbalized he felt safe to get to bathroom on his own and unable to verbalize or demonstrate how to call for assistance. Patient required increased O2 (6L) while sitting EOB to maintain sats 91% and after walking 25 ft with RW decr to 88% and unable to incr sats during standing rest break. By the time he returned to chair, his sats were 84% on 6L (pediatric probe on left hand and specialty [employer] forehead probe both used and were consistently within 1-2% of each other). After 2 minutes seated rest break with cues for pursed lip breathing he had returned to 88% on 6L. RN made aware of above and that pt currently is not to walk in hallway as he would need >6L O2 which is considered aerosolizing.    Follow Up Recommendations  Home health PT;Supervision/Assistance - 24 hour     Equipment Recommendations  None recommended by PT    Recommendations for Other Services       Precautions / Restrictions Precautions Precautions: Fall;Other  (comment) Precaution Comments: desaturates with activity Restrictions Weight Bearing Restrictions: No    Mobility  Bed Mobility Overal bed mobility: Needs Assistance Bed Mobility: Supine to Sit     Supine to sit: Min guard;HOB elevated     General bed mobility comments: incr effort/time  Transfers Overall transfer level: Needs assistance Equipment used: Rolling walker (2 wheeled);None Transfers: Sit to/from Stand Sit to Stand: Min guard         General transfer comment: vc's for safe use of RW  Ambulation/Gait Ambulation/Gait assistance: Min guard Gait Distance (Feet): 45 Feet(seated rest; 50 ft) Assistive device: Rolling walker (2 wheeled) Gait Pattern/deviations: Step-through pattern;Decreased stride length Gait velocity: slow   General Gait Details: on 6L with sats 90-91% at rest; dropping to 84%, RR 34 with ambulation; attempted standing rest break to recover and slow RR, however pt required seated rest, vc for pursed lip breathing and 2 minutes to achieve 89-90% (on 6L)   Stairs             Wheelchair Mobility    Modified Rankin (Stroke Patients Only)       Balance Overall balance assessment: Needs assistance Sitting-balance support: No upper extremity supported;Feet supported Sitting balance-Leahy Scale: Fair     Standing balance support: Bilateral upper extremity supported Standing balance-Leahy Scale: Poor Standing balance comment: pt too fatigued to participate in balance exercises  Cognition Arousal/Alertness: Awake/alert Behavior During Therapy: WFL for tasks assessed/performed Overall Cognitive Status: Impaired/Different from baseline Area of Impairment: Memory;Problem solving;Safety/judgement;Awareness                     Memory: Decreased short-term memory;Decreased recall of precautions   Safety/Judgement: Decreased awareness of deficits;Decreased awareness of safety Awareness:  Intellectual Problem Solving: Requires verbal cues;Slow processing General Comments: per wife, confusion started just PTA; pt unable to verbalize that he should call for help prior to getting out of chair and unable to demonstrate how to call using call bell system (even with questioning cues); repeated need to call for assist, chair alarm present to remind him to sit down until someone comes to help him and how to use call bell      Exercises      General Comments General comments (skin integrity, edema, etc.): On arrival; BSC ~2 feet from foot of bed with urine on the floor and in the [facility]; pt in bed on his side with cords/lines wrapped under him and all telemetry electrodes and wires pulled off; pt denied getting up alone, however appeared he did      Pertinent Vitals/Pain Pain Assessment: No/denies pain    Home Living                      Prior Function            PT Goals (current goals can now be found in the care plan section) Acute Rehab PT Goals Patient Stated Goal: to get stronger Time For Goal Achievement: 04/07/19 Potential to Achieve Goals: Good Progress towards PT goals: Progressing toward goals    Frequency    Min 3X/week      PT Plan Current plan remains appropriate    Co-evaluation              AM-PAC PT "6 Clicks" Mobility   Outcome Measure  Help needed turning from your back to your side while in a flat bed without using bedrails?: None Help needed moving from lying on your back to sitting on the side of a flat bed without using bedrails?: A Little Help needed moving to and from a bed to a chair (including a wheelchair)?: A Little Help needed standing up from a chair using your arms (e.g., wheelchair or bedside chair)?: A Little Help needed to walk in hospital room?: A Little Help needed climbing 3-5 steps with a railing? : A Lot 6 Click Score: 18    End of Session Equipment Utilized During Treatment: Oxygen;Gait belt Activity  Tolerance: Patient limited by fatigue;Treatment limited secondary to medical complications (CXKGYJE)(5U with sats dropping to 84%) Patient left: in chair;with call bell/phone within reach;with chair alarm set;with nursing/sitter in room Nurse Communication: Mobility status;Other (comment)(condition pt found in on arrival (see above)) PT Visit Diagnosis: Difficulty in walking, not elsewhere classified (R26.2);Unsteadiness on feet (R26.81)     Time: 3149-7026 PT Time Calculation (min) (ACUTE ONLY): 52 min  Charges:  $Gait Training: 23-37 mins $Self Care/Home Management: 8-22                       Barry Brunner, PT       [place] Adraine Biffle 03/25/2019, 11:22 AM

## 2019-03-25 NOTE — Progress Notes (Addendum)
Physical Therapy Treatment  Clinical Impression: Despite earlier session with extensive education on risk of falling, potential injuries, and how to call for assist, patient noted to be sitting on EOB with chair alarm ringing and footrest of recliner still in up position. Patient reported he climbed out of chair to go to the bathroom, however got scared when he stood up and decided to sit down on the EOB. See below for details of session. At end of session, pt returned to bed (per his request), placed in chair-like position and asked pt to demonstrate how to call for his nurse. He again was unable to recall process. He at first found the phone and stated, "I just push this red button." When told that was incorrect and that was his phone, he initially did not believe what he was told. Cued patient to look in his lap and find the other control. He found the call bell remote and was then able to demonstrate how to push red button and call for help.   Spoke with pt's nurse, Marcelino Scot, regarding above and concern for pt to continue to get up unassisted.   Understand wife is arranging 24/7 care for patient at home--which he will need due to his degree of confusion.    03/25/19 1327  PT Visit Information  Last PT Received On 03/25/19  Assistance Needed +1  History of Present Illness 78 y.o. 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 Patient lives with daughter who has [place]. Pt tested COVID +; Had mild confusion at time of presentation.  Also had fallen earlier in day due to generalized weakness. Mild rhabdomyolysis: vs. covid-myopathy. ?pressure area left gluteal cleft  Subjective Data  Patient Stated Goal to get stronger  Precautions  Precautions Fall;Other (comment)  Precaution Comments desaturates with activity   Restrictions  Weight Bearing Restrictions No  Cognition  Arousal/Alertness Awake/alert  Behavior During Therapy WFL for tasks assessed/performed  Overall Cognitive Status Impaired/Different from baseline  Area of Impairment Memory;Problem solving;Safety/judgement;Awareness  Memory Decreased short-term memory;Decreased recall of precautions  Safety/Judgement Decreased awareness of deficits;Decreased awareness of safety  Awareness Intellectual  Problem Solving Requires verbal cues;Slow processing  General Comments despite recent education by PT re: how to use call bell, pt unable to recall/demonstrate how to use call system  Bed Mobility  Overal bed mobility Needs Assistance  Bed Mobility Sit to Supine  Sit to supine Min guard  General bed mobility comments incr effort/time  Transfers  Overall transfer level Needs assistance  Equipment used None  Transfers Sit to/from Stand;Stand Pivot Transfers  Sit to Stand Min guard  Stand pivot transfers Min guard  General transfer comment sitting EOB to [facility] and back to bed  Balance  Overall balance assessment Needs assistance  Sitting-balance support No upper extremity supported;Feet supported  Sitting balance-Leahy Scale Fair  Standing balance support No upper extremity supported;During functional activity  Standing balance-Leahy Scale Fair  PT - End of Session  Equipment Utilized During Treatment Oxygen;Gait belt  Activity Tolerance Patient limited by fatigue  Patient left with call bell/phone within reach;in bed;with bed alarm set  Nurse Communication Mobility status;Other (comment) (pt I'ly transfer chair to EOB w/ chair alarm sounding on arr)   PT - Assessment/Plan  PT Plan Current plan remains appropriate  PT Visit Diagnosis Difficulty in walking,  not elsewhere classified (R26.2);Unsteadiness on feet (R26.81)  PT Frequency (ACUTE ONLY) Min 3X/week  Follow Up Recommendations Home health PT;Supervision/Assistance - 24 hour  PT  equipment None recommended by PT  AM-PAC PT "6 Clicks" Mobility Outcome Measure (Version 2)  Help needed turning from your back to your side while in a flat bed without using bedrails? 4  Help needed moving from lying on your back to sitting on the side of a flat bed without using bedrails? 3  Help needed moving to and from a bed to a chair (including a wheelchair)? 3  Help needed standing up from a chair using your arms (e.g., wheelchair or bedside chair)? 3  Help needed to walk in hospital room? 3  Help needed climbing 3-5 steps with a railing?  2  6 Click Score 18  Consider Recommendation of Discharge To: Home with [facility]  PT Goal Progression  Progress towards PT goals Progressing toward goals  Acute Rehab PT Goals  Time For Goal Achievement 04/07/19  Potential to Achieve Goals Good  PT Time Calculation  PT Start Time (ACUTE ONLY) 1209  PT Stop Time (ACUTE ONLY) 1218  PT Time Calculation (min) (ACUTE ONLY) 9 min  PT General Charges  $$ ACUTE PT VISIT 1 Visit  PT Treatments  $Therapeutic Activity 8-22 mins     Barry Brunner, PT

## 2019-03-25 NOTE — Progress Notes (Signed)
Pt refusing to wear tele at this time. States he is tired of getting tangled up in the cords. Agreeable to leave pulse ox on. On call provider and Tele notified.

## 2019-03-26 ENCOUNTER — Other Ambulatory Visit: Payer: Self-pay

## 2019-03-26 LAB — COMPREHENSIVE METABOLIC PANEL
ALT: 78 U/L — ABNORMAL HIGH (ref 0–44)
AST: 66 U/L — ABNORMAL HIGH (ref 15–41)
Albumin: 3.2 g/dL — ABNORMAL LOW (ref 3.5–5.0)
Alkaline Phosphatase: 95 U/L (ref 38–126)
Anion gap: 9 (ref 5–15)
BUN: 60 mg/dL — ABNORMAL HIGH (ref 8–23)
CO2: 32 mmol/L (ref 22–32)
Calcium: 9.6 mg/dL (ref 8.9–10.3)
Chloride: 108 mmol/L (ref 98–111)
Creatinine, Ser: 1.32 mg/dL — ABNORMAL HIGH (ref 0.61–1.24)
GFR calc Af Amer: >60 mL/min (ref >60)
GFR calc non Af Amer: 52 mL/min — ABNORMAL LOW (ref >60)
Glucose, Bld: 138 mg/dL — ABNORMAL HIGH (ref 70–99)
Potassium: 3.7 mmol/L (ref 3.5–5.1)
Sodium: 149 mmol/L — ABNORMAL HIGH (ref 135–145)
Total Bilirubin: 0.8 mg/dL (ref 0.3–1.2)
Total Protein: 7 g/dL (ref 6.5–8.1)

## 2019-03-26 LAB — CBC WITH DIFFERENTIAL/PLATELET
Abs Immature Granulocytes: 0.05 10*3/uL (ref 0.00–0.07)
Basophils Absolute: 0 10*3/uL (ref 0.0–0.1)
Basophils Relative: 0 %
Eosinophils Absolute: 0 10*3/uL (ref 0.0–0.5)
Eosinophils Relative: 0 %
HCT: 47.9 % (ref 39.0–52.0)
Hemoglobin: 14.4 g/dL (ref 13.0–17.0)
Immature Granulocytes: 1 %
Lymphocytes Relative: 5 %
Lymphs Abs: 0.3 10*3/uL — ABNORMAL LOW (ref 0.7–4.0)
MCH: 28 pg (ref 26.0–34.0)
MCHC: 30.1 g/dL (ref 30.0–36.0)
MCV: 93 fL (ref 80.0–100.0)
Monocytes Absolute: 0.4 10*3/uL (ref 0.1–1.0)
Monocytes Relative: 6 %
Neutro Abs: 5.7 10*3/uL (ref 1.7–7.7)
Neutrophils Relative %: 88 %
Platelets: 161 10*3/uL (ref 150–400)
RBC: 5.15 MIL/uL (ref 4.22–5.81)
RDW: 17 % — ABNORMAL HIGH (ref 11.5–15.5)
WBC: 6.4 10*3/uL (ref 4.0–10.5)
nRBC: 0 % (ref 0.0–0.2)

## 2019-03-26 LAB — GLUCOSE, CAPILLARY
Glucose-Capillary: 133 mg/dL — ABNORMAL HIGH (ref 70–99)
Glucose-Capillary: 323 mg/dL — ABNORMAL HIGH (ref 70–99)
Glucose-Capillary: 304 mg/dL — ABNORMAL HIGH (ref 70–99)
Glucose-Capillary: 244 mg/dL — ABNORMAL HIGH (ref 70–99)

## 2019-03-26 LAB — D-DIMER, QUANTITATIVE: D-Dimer, Quant: 0.93 ug/mL-FEU — ABNORMAL HIGH (ref 0.00–0.50)

## 2019-03-26 LAB — FERRITIN: Ferritin: 1109 ng/mL — ABNORMAL HIGH (ref 24–336)

## 2019-03-26 LAB — CK: Total CK: 263 U/L (ref 49–397)

## 2019-03-26 LAB — C-REACTIVE PROTEIN: CRP: 0.9 mg/dL (ref <1.0)

## 2019-03-26 MED ORDER — FUROSEMIDE 10 MG/ML IJ SOLN
40.0000 mg | Freq: Once | INTRAMUSCULAR | Status: AC
  Administered 2019-03-26: 40 mg via INTRAVENOUS
  Filled 2019-03-26 (×2): qty 4

## 2019-03-26 MED ORDER — ENOXAPARIN SODIUM 60 MG/0.6ML ~~LOC~~ SOLN
50.0000 mg | SUBCUTANEOUS | Status: DC
  Administered 2019-03-26 – 2019-03-31 (×6): 50 mg via SUBCUTANEOUS
  Filled 2019-03-26 (×7): qty 0.6

## 2019-03-26 MED ORDER — SODIUM CHLORIDE 0.9% IV SOLUTION: Freq: Once | INTRAVENOUS | Status: DC

## 2019-03-26 MED ORDER — INSULIN GLARGINE 100 UNIT/ML ~~LOC~~ SOLN
50.0000 [IU] | Freq: Every day | SUBCUTANEOUS | Status: DC
  Administered 2019-03-26 – 2019-03-27 (×2): 50 [IU] via SUBCUTANEOUS
  Filled 2019-03-26 (×3): qty 0.5

## 2019-03-26 NOTE — Progress Notes (Signed)
OT Treatment Note  Entered room due to chair alarm sounding. Pt up, tangled in his O2 tubing and IV and trying to go to the [facility]. Assisted pt with toileting and pt required mod VC to process information. Trying to get pt to clean his periarea, but he would only continue to wash his hands. Attempted again to have pt complete his incentive spirometer, but pt could not process how to use the IS. Pt aware it is September but unable to tell me the name of the hospital. Poor safety and apparent impaired cognition. Will need 24/7 S at DC.    03/26/19 1700  OT Visit Information  Last OT Received On 03/26/19  Assistance Needed +1  History of Present Illness 78 y.o. 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 Patient lives with daughter who has [place]. Pt tested COVID +; Had mild confusion at time of presentation.  Also had fallen earlier in day due to generalized weakness. Mild rhabdomyolysis: vs. covid-myopathy. ?pressure area left gluteal cleft  Precautions  Precautions Fall;Other (comment)  Precaution Comments desaturates with activity  Pain Assessment  Pain Assessment No/denies pain  Cognition  Arousal/Alertness Lethargic  Behavior During Therapy Flat affect  Overall Cognitive Status Impaired/Different from baseline  Area of Impairment Orientation;Attention;Memory;Following commands;Safety/judgement;Awareness;Problem solving  Orientation Level Disoriented to;Time;Place  Current Attention Level Sustained  Memory Decreased short-term memory  Following Commands Follows one step commands inconsistently  Safety/Judgement Decreased awareness of safety;Decreased awareness of deficits  Awareness Emergent  Problem Solving Slow processing;Decreased initiation;Difficulty sequencing;Requires verbal cues  ADL  Overall  ADL's  Needs assistance/impaired  Toilet Transfer Min guard  Toileting- Clothing Manipulation and Hygiene Minimal assistance  Toileting - Clothing Manipulation Details (indicate cue type and reason) Handed pt washcloth to clean periare and he continued to perseverate on washing his hands  Bed Mobility  General bed mobility comments OOB in reclienr  Balance  Sitting balance-Leahy Scale Fair  Standing balance-Leahy Scale Poor  Standing balance comment reliant on external support  Transfers  Overall transfer level Needs assistance  Transfers Stand Pivot Transfers;Sit to/from Stand  Sit to [employer] guard  Stand pivot transfers Min assist  General transfer comment VC for safety; pt wrapped in O2 tubing adn unaware  OT - End of Session  Equipment Utilized During Treatment Oxygen  Activity Tolerance Patient tolerated treatment well  Patient left in chair;with call bell/phone within reach;with chair alarm set  Nurse Communication Mobility status;Other (comment) (pt safety)  OT Assessment/Plan  OT Plan Discharge plan remains appropriate;Frequency needs to be updated  OT Visit Diagnosis Other abnormalities of gait and mobility (R26.89);Muscle weakness (generalized) (M62.81)  OT Frequency (ACUTE ONLY) Min 3X/week  Follow Up Recommendations Home health OT;Supervision/Assistance - 24 hour  OT Equipment 3 in 1 bedside commode  AM-PAC OT "6 Clicks" Daily Activity Outcome Measure (Version 2)  Help from another person eating meals? 4  Help from another person taking care of personal grooming? 3  Help from another person toileting, which includes using toliet, bedpan, or urinal? 3  Help from another person bathing (including washing, rinsing, drying)? 3  Help from another person to put on and taking off regular upper body clothing? 3  Help from another person to put on and taking off regular lower body clothing? 2  6 Click Score 18  OT  Goal Progression  Progress towards OT goals Progressing  toward goals  Acute Rehab OT Goals  Patient Stated Goal to get stronger  OT Goal Formulation With patient  Time For Goal Achievement 04/07/19  Potential to Achieve Goals Good  ADL Goals  Pt Will Perform Grooming with supervision;standing  Pt Will Perform Lower Body Bathing with supervision;sit to/from stand;sitting/lateral leans  Pt Will Perform Lower Body Dressing with supervision;sitting/lateral leans;sit to/from stand  Pt Will Transfer to Toilet with supervision;regular height toilet;ambulating  Pt Will Perform Tub/Shower Transfer with supervision;shower seat;ambulating;rolling walker  Additional ADL Goal #1 Pt will recall and/or apply 1-3 ECS strategies while engaging in BADL activity for successful completion  Additional ADL Goal #2 Pt will demonstrate anticipatory awareness in moderately distracting environment for safe IADL completion  OT Time Calculation  OT Start Time (ACUTE ONLY) 1605  OT Stop Time (ACUTE ONLY) 1620  OT Time Calculation (min) 15 min  OT General Charges  $OT Visit 1 Visit  OT Treatments  $Self Care/Home Management  8-22 mins  Maurie Boettcher, OT/L   Acute OT Clinical Specialist Acute Rehabilitation Services Pager [phone] Office [phone]

## 2019-03-26 NOTE — Progress Notes (Addendum)
Occupational Therapy Treatment Patient Details Name: [name] MRN: [id number] DOB: 1940/11/12 Today's Date: 03/26/2019    History of present illness 78 y.o. 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 Patient lives with daughter who has [place]. Pt tested COVID +; Had mild confusion at time of presentation.  Also had fallen earlier in day due to generalized weakness. Mild rhabdomyolysis: vs. covid-myopathy. ?pressure area left gluteal cleft   OT comments  Pt more lethargic today. Oriented to month and year, but demonstrates significant decline with problem solving, attention and insight/awareness of deficits. Pt unable to process how to correctly use incentive spirometer, then tried to use the theraband as an incentive spirometer.  Pt assessed with the Short Blessed Test - a short orientation-memory concentration test and received a score of 8, placing him in the "impaired" category. Pt desats wtiuh ADL activity and walking in room to high 70s with 3/4 DOE on 5L. Pt taking short "panting breaths". Rebounds into 90s with verbal and tactile cues to take deeper and slower breaths. Nsg and MD made aware of increased confusion. Pt states he did not sleep well last night - if so, poor sleep also most likely contributing to decline in cognition. Continue to recommend [place] but will need 24/7 assistance after DC.   Follow Up Recommendations  Home health OT;Supervision/Assistance - 24 hour    Equipment Recommendations  3 in 1 bedside commode    Recommendations for Other Services      Precautions / Restrictions Precautions Precautions: Fall;Other (comment) Precaution Comments: desaturates with activity       Mobility Bed Mobility Overal bed mobility: Needs Assistance Bed Mobility: Supine to Sit        Sit to supine: Min guard      Transfers Overall transfer level: Needs assistance Equipment used: Rolling walker (2 wheeled) Transfers: Sit to/from [facility] Sit to Stand: Min guard Stand pivot transfers: Min assist            Balance Overall balance assessment: Needs assistance   Sitting balance-Leahy Scale: Fair       Standing balance-Leahy Scale: Poor Standing balance comment: reliant on external support                           ADL either performed or assessed with clinical judgement   ADL Overall ADL's : Needs assistance/impaired     Grooming: Set up;Sitting;Supervision/safety   Upper Body Bathing: Set up;Supervision/ safety;Sitting   Lower Body Bathing: Minimal assistance;Sit to/from stand   Upper Body Dressing : Minimal assistance;Sitting   Lower Body Dressing: Moderate assistance;Sit to/from stand   Toilet Transfer: Minimal assistance;BSC;RW   Toileting- Clothing Manipulation and Hygiene: Minimal assistance;Sit to/from stand       Functional mobility during ADLs: Minimal assistance;Rolling walker;Cueing for safety(repetitive cues for safe useof RW) General ADL Comments: DOE 3/4 on exertion with desat inot high 70s.     Vision       Perception     Praxis      Cognition Arousal/Alertness: Lethargic Behavior During Therapy: Flat affect Overall Cognitive Status: Impaired/Different from baseline Area of Impairment: Orientation;Attention;Memory;Following commands;Safety/judgement;Awareness;Problem solving                 Orientation Level: Disoriented to;Time;Place Current Attention Level: Sustained  Memory: Decreased short-term memory Following Commands: Follows one step commands inconsistently Safety/Judgement: Decreased awareness of safety;Decreased awareness of deficits Awareness: Emergent Problem Solving: Slow processing;Decreased initiation;Difficulty sequencing;Requires verbal cues General Comments:  Pt having difficulty processing how to use hkis IS, continued to blow into his IS despite repetitive cues to "suck in a breath"; Pt moving his therabnad to his moutn to use as an IS; Score of 8 on Short Blessed test        Exercises Other Exercises Other Exercises: attempted incentive spirometer- pt unable to process how to correctly use incentive spirometer Other Exercises: Attempted therabnad ex but pt bringing theraband to his mouth like the incentive spriometer Other Exercises: flutter valve x 10 - required cues to stop using flutter valve as he has difficulty terminating the activity   Shoulder Instructions       General Comments      Pertinent Vitals/ Pain       Pain Assessment: No/denies pain  Home Living                                          Prior Functioning/Environment              Frequency  Min 3X/week        Progress Toward Goals  OT Goals(current goals can now be found in the care plan section)  Progress towards OT goals: Progressing toward goals  Acute Rehab OT Goals Patient Stated Goal: to get stronger OT Goal Formulation: With patient Time For Goal Achievement: 04/07/19 Potential to Achieve Goals: Good ADL Goals Pt Will Perform Grooming: with supervision;standing Pt Will Perform Lower Body Bathing: with supervision;sit to/from stand;sitting/lateral leans Pt Will Perform Lower Body Dressing: with supervision;sitting/lateral leans;sit to/from stand Pt Will Transfer to Toilet: with supervision;regular height toilet;ambulating Pt Will Perform Tub/Shower Transfer: with supervision;shower seat;ambulating;rolling walker Additional ADL Goal #1: Pt will recall and/or apply 1-3 ECS strategies while engaging in BADL activity for successful completion Additional ADL Goal #2: Pt will demonstrate anticipatory awareness in moderately distracting environment for safe IADL completion  Plan Discharge plan remains appropriate;Frequency needs to  be updated    Co-evaluation                 AM-PAC OT "6 Clicks" Daily Activity     Outcome Measure   Help from another person eating meals?: None Help from another person taking care of personal grooming?: A Little Help from another person toileting, which includes using toliet, bedpan, or urinal?: A Little Help from another person bathing (including washing, rinsing, drying)?: A Little Help from another person to put on and taking off regular upper body clothing?: A Little Help from another person to put on and taking off regular lower body clothing?: A Lot 6 Click Score: 18    End of Session Equipment Utilized During Treatment: Gait belt;Rolling walker;Oxygen(5L)  OT Visit Diagnosis: Other abnormalities of gait and mobility (R26.89);Muscle weakness (generalized) (M62.81)   Activity Tolerance Patient tolerated treatment well   Patient Left in chair;with call bell/phone within reach;with chair alarm set   Nurse Communication Mobility status        Time: 6761-9509 OT Time Calculation (min): 38 min  Charges: OT General Charges $OT Visit: 1 Visit OT Treatments $Self Care/Home Management : 38-52 mins  Maurie Boettcher, OT/L   Acute OT Clinical Specialist [place] Pager [phone] Office [phone]  [place] 03/26/2019, 12:37 PM

## 2019-03-26 NOTE — Progress Notes (Addendum)
PROGRESS NOTE  [name]  IWL:[id number] DOB: [birth year] DOA: 03/22/2019 PCP: [name], MD   Brief Narrative: [name] is a 78 y.o. 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 He presented to [place] 9/19 from home where his daughter stays and is covid-positive, with a day of SOB, weakness, having fallen at home and appearing mildly confused. On evaluation he was hypoxic requiring 10L HFNC, SARS-CoV-2 PCR positive, CXR with RML, RLL infiltrates, CRP and d-dimer elevated, PCT negative. Solumedrol, vancomycin, and aztreonam with 1L IVF administered and patient was admitted to [facility], started on remdesivir.   Subjective:  By staff earlier he was confused, but upon my evaluation this all appears to be resolved, he himself denies any complaints, no chest pain, no shortness of breath.   Assessment & Plan: Principal Problem:   Pneumonia due to COVID-19 virus Active Problems:   Acute respiratory failure with hypoxia (HCC)   CAD (coronary artery disease)   Poorly controlled type 2 diabetes mellitus with circulatory disorder (HCC)   AKI (acute kidney injury) (HCC)   Chronic diastolic heart failure (HCC)   CKD (chronic kidney disease) stage 3, GFR 30-59 ml/min (HCC)   Sacral decubitus ulcer  Acute hypoxic respiratory failure due to RDEYC-14 pneumonia:  - Certainly at high risk of decompensation/progression to MODS/ARDS.  -He remains on 5 to 6 L oxygen today, was encouraged use incentive spirometry and flutter valve, and to stay out of bed to chair as long as he can tolerate - Remdesivir, continue 9/19 - 9/23 - Continue decadron -She is at high risk for decompensation, mains on 5 to 6 L nasal cannula, unable to wean, discussed with patient,and wife , they are agreeable to plasma, will transfuse today. -I have  discussed Actemra with the patient,and the wife and they are agreeable  if indicated, but CRP has normalized. -Continue to trend inflammatory markers closely, CRP has normalized, but ferritin remains significantly elevated. - Maintain euvolemia/net negative.  - Avoid NSAIDs - Recommend proning and aggressive use of incentive spirometry. - Goals of care were discussed. Prognosis is guarded.   COVID-19 Labs  Recent Labs    03/24/19 0345 03/25/19 0240 03/26/19 0345  DDIMER 1.45* 0.96* 0.93*  FERRITIN 973* 1,151* 1,109*  CRP 1.8* 1.5* 0.9    No results found for: SARSCOV2NAA   LFT elevation: -Most likely in the setting of COVID-19 infection, continue to monitor closely especially on Remdesivir  AKI on stage III CKD: Suspect prerenal injury, has continued improvement back to baseline. Cr 2.2 and continues to improve better than suspected baseline of 1.6. Did receive vancomycin in ED.  - Check daily.  - Avoid hypotension and nephrotoxins.   Thrombocytopenia: Chronic, dating back to 2017. Stable/slightly improved again today. Ok to use DVT ppx given high risk and stable/normal hgb.   Lymphopenia: Due to covid viral infection most likely.  - Continue monitoring  IDT2DM:  -CBG remains significantly elevated, I have increased his Lantus to 50 units subcu daily, continue with pre-meal insulin and insulin sliding scale .  CAD s/p CABG: No chest pain, troponin 0.08, paced rhythm without new ischemic changes on ECG.  - EDP discussed with cardiology who recommended no further work up at this time - Continue beta blocker, ASA, statin  Chronic HFpEF: Appears euvolemic - Restart home demadex 40mg  BID  now that renal function stabilized and taking normal po.  Fall at home, weakness:  - Not orthostatic-sounding symptoms, suspect weakness due to covid.  - PT/OT evaluations ongoing, wife arranging 24 hr home care.  Complete heart block : s/p PPM which appears to be functioning appropriately.    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   Unstageable coccyx pressure injury: POA - Offload as able.  - WOC consulted, instituting Gerhart's butt cream TID.   Mild rhabdomyolysis: vs. covid-myopathy: With hx of fall at home, dipstick positive hgb w/o RBCs on micro and AKI. CK was checked, modestly elevated.  - Has been given fluids with resolution of CK elevation.  Obesity: BMI 38.   DVT prophylaxis: Subcu Lovenox Code Status: Full Family Communication: Wife by phone daily. Disposition Plan: Uncertain, pending clinical course.   Consultants:   None  Procedures:   None  Antimicrobials:  Vancomycin, aztreonam 9/19  Remdesivir 9/19 - 9/23     Objective: Vitals:   03/26/19 0415 03/26/19 0839 03/26/19 0841 03/26/19 1257  BP: 130/89  (!) 152/99   Pulse: 99  (!) 52 64  Resp:    (!) 22  Temp: 98.1 F (36.7 C) 97.7 F (36.5 C)    TempSrc: Oral     SpO2: 91%  96% 99%  Weight:      Height:        Intake/Output Summary (Last 24 hours) at 03/26/2019 1420 Last data filed at 03/26/2019 0500 Gross per 24 hour  Intake -  Output 1200 ml  Net -1200 ml   Filed Weights   03/22/19 2031 03/23/19 0000 03/23/19 0221  Weight: 104.2 kg 104 kg 104 kg   Awake Alert, Oriented X 3, No new F.N deficits, Normal affect, seems to be appropriate and coherent Symmetrical Chest wall movement, Good air movement bilaterally, bibasilar crackles RRR,No Gallops,Rubs or new Murmurs, No Parasternal Heave +ve B.Sounds, Abd Soft, No tenderness, No rebound - guarding or rigidity. No Cyanosis, Clubbing, +1 edema, No new Rash or bruise    Data Reviewed: I have personally reviewed following labs and imaging studies  CBC: Recent Labs  Lab 03/23/19 0211 03/24/19 0345 03/25/19 0240 03/26/19 0345  WBC 1.5* 5.0 5.9 6.4  NEUTROABS 1.2* 4.3 5.3 5.7  HGB 13.1 13.3 13.2 14.4  HCT 43.7 44.5 44.1 47.9  MCV 94.2 94.9 93.8 93.0  PLT 95* 112* 123* 606   Basic Metabolic Panel:  Recent Labs  Lab 03/23/19 0211 03/24/19 0345 03/25/19 0240 03/26/19 0345  NA 140 142 143 149*  K 3.8 3.6 3.7 3.7  CL 107 109 108 108  CO2 23 24 26  32  GLUCOSE 198* 268* 238* 138*  BUN 55* 65* 56* 60*  CREATININE 1.84* 1.59* 1.39* 1.32*  CALCIUM 8.1* 8.9 9.1 9.6   GFR: Estimated Creatinine Clearance: 52.9 mL/min (A) (by C-G formula based on SCr of 1.32 mg/dL (H)). Liver Function Tests: Recent Labs  Lab 03/23/19 0211 03/24/19 0345 03/25/19 0240 03/26/19 0345  AST 65* 61* 52* 66*  ALT 38 47* 49* 78*  ALKPHOS 63 66 74 95  BILITOT 0.7 0.8 0.7 0.8  PROT 6.1* 5.9* 6.2* 7.0  ALBUMIN 2.8* 2.7* 2.7* 3.2*   Cardiac Enzymes: Recent Labs  Lab 03/23/19 0211 03/24/19 0345 03/25/19 0240 03/26/19 0345  CKTOTAL 737* 460* 291 263   CBG: Recent Labs  Lab 03/25/19 1206 03/25/19 1606 03/25/19 2120 03/26/19 0807 03/26/19 1139  GLUCAP 291* 411* 371* 133* 323*   Urine analysis:  Component Value Date/Time   COLORURINE YELLOW 03/22/2019 2127   APPEARANCEUR CLOUDY (A) 03/22/2019 2127   LABSPEC 1.013 03/22/2019 2127   PHURINE 5.0 03/22/2019 2127   GLUCOSEU NEGATIVE 03/22/2019 2127   HGBUR MODERATE (A) 03/22/2019 2127   BILIRUBINUR NEGATIVE 03/22/2019 2127   [place] 03/22/2019 2127   PROTEINUR 30 (A) 03/22/2019 2127   UROBILINOGEN 0.2 07/11/2013 1345   NITRITE NEGATIVE 03/22/2019 2127   LEUKOCYTESUR MODERATE (A) 03/22/2019 2127   No results found for this or any previous visit (from the past 240 hour(s)).    Radiology Studies: No results found.  Scheduled Meds: . sodium chloride   Intravenous Once  . allopurinol  300 mg Oral Daily  . aspirin EC  81 mg Oral Daily  . atorvastatin  80 mg Oral QAC breakfast  . bisoprolol  2.5 mg Oral Daily  . calcitRIOL  0.25 mcg Oral Daily  . dexamethasone  6 mg Oral Q24H  . enoxaparin (LOVENOX) injection  50 mg Subcutaneous Q24H  . furosemide  40 mg Intravenous Once  . Gerhardt's butt cream   Topical TID  . insulin  aspart  0-20 Units Subcutaneous TID WC  . insulin aspart  0-5 Units Subcutaneous QHS  . insulin aspart  10 Units Subcutaneous TID WC  . insulin glargine  50 Units Subcutaneous Daily  . Ipratropium-Albuterol  1 puff Inhalation Q6H  . pantoprazole  40 mg Oral QHS  . torsemide  40 mg Oral BID   Continuous Infusions: . remdesivir 100 mg in NS 250 mL 100 mg (03/25/19 1724)     LOS: 4 days   Time spent: 35 minutes.  Phillips Climes, MD [facility] [email or web address] Password [email or web address] 03/26/2019, 2:20 PM

## 2019-03-26 NOTE — Progress Notes (Signed)
Pt continues to refuse telemetry. Attempted x3 for IV start. Will place IV consult

## 2019-03-26 NOTE — Progress Notes (Signed)
CrCl>30 ml/min. Ok to change SQ heparin to Lovenox 50mg  SQ qday per Dr. Waldron Labs.   Onnie Boer, PharmD, BCIDP, AAHIVP, CPP Infectious Disease Pharmacist 03/26/2019 1:26 PM

## 2019-03-27 DIAGNOSIS — E870 Hyperosmolality and hypernatremia: Secondary | ICD-10-CM

## 2019-03-27 LAB — CBC WITH DIFFERENTIAL/PLATELET
Abs Immature Granulocytes: 0.06 10*3/uL (ref 0.00–0.07)
Basophils Absolute: 0 10*3/uL (ref 0.0–0.1)
Basophils Relative: 0 %
Eosinophils Absolute: 0 10*3/uL (ref 0.0–0.5)
Eosinophils Relative: 0 %
HCT: 50.9 % (ref 39.0–52.0)
Hemoglobin: 15.5 g/dL (ref 13.0–17.0)
Immature Granulocytes: 1 %
Lymphocytes Relative: 8 %
Lymphs Abs: 0.5 10*3/uL — ABNORMAL LOW (ref 0.7–4.0)
MCH: 28.1 pg (ref 26.0–34.0)
MCHC: 30.5 g/dL (ref 30.0–36.0)
MCV: 92.4 fL (ref 80.0–100.0)
Monocytes Absolute: 0.4 10*3/uL (ref 0.1–1.0)
Monocytes Relative: 6 %
Neutro Abs: 5.4 10*3/uL (ref 1.7–7.7)
Neutrophils Relative %: 85 %
Platelets: 151 10*3/uL (ref 150–400)
RBC: 5.51 MIL/uL (ref 4.22–5.81)
RDW: 17.9 % — ABNORMAL HIGH (ref 11.5–15.5)
WBC: 6.3 10*3/uL (ref 4.0–10.5)
nRBC: 0 % (ref 0.0–0.2)

## 2019-03-27 LAB — GLUCOSE, CAPILLARY
Glucose-Capillary: 90 mg/dL (ref 70–99)
Glucose-Capillary: 113 mg/dL — ABNORMAL HIGH (ref 70–99)
Glucose-Capillary: 324 mg/dL — ABNORMAL HIGH (ref 70–99)
Glucose-Capillary: 475 mg/dL — ABNORMAL HIGH (ref 70–99)
Glucose-Capillary: 357 mg/dL — ABNORMAL HIGH (ref 70–99)
Glucose-Capillary: 281 mg/dL — ABNORMAL HIGH (ref 70–99)

## 2019-03-27 LAB — COMPREHENSIVE METABOLIC PANEL
ALT: 147 U/L — ABNORMAL HIGH (ref 0–44)
AST: 104 U/L — ABNORMAL HIGH (ref 15–41)
Albumin: 3.4 g/dL — ABNORMAL LOW (ref 3.5–5.0)
Alkaline Phosphatase: 97 U/L (ref 38–126)
Anion gap: 13 (ref 5–15)
BUN: 69 mg/dL — ABNORMAL HIGH (ref 8–23)
CO2: 34 mmol/L — ABNORMAL HIGH (ref 22–32)
Calcium: 10 mg/dL (ref 8.9–10.3)
Chloride: 103 mmol/L (ref 98–111)
Creatinine, Ser: 1.54 mg/dL — ABNORMAL HIGH (ref 0.61–1.24)
GFR calc Af Amer: 50 mL/min — ABNORMAL LOW (ref >60)
GFR calc non Af Amer: 43 mL/min — ABNORMAL LOW (ref >60)
Glucose, Bld: 84 mg/dL (ref 70–99)
Potassium: 3.4 mmol/L — ABNORMAL LOW (ref 3.5–5.1)
Sodium: 150 mmol/L — ABNORMAL HIGH (ref 135–145)
Total Bilirubin: 1.1 mg/dL (ref 0.3–1.2)
Total Protein: 7.5 g/dL (ref 6.5–8.1)

## 2019-03-27 LAB — PREPARE FRESH FROZEN PLASMA

## 2019-03-27 LAB — C-REACTIVE PROTEIN: CRP: <0.8 mg/dL (ref <1.0)

## 2019-03-27 LAB — BPAM FFP
Blood Product Expiration Date: 202009241657
ISSUE DATE / TIME: 202009231755
Unit Type and Rh: 5100

## 2019-03-27 LAB — MRSA PCR SCREENING: MRSA by PCR: NEGATIVE

## 2019-03-27 LAB — D-DIMER, QUANTITATIVE: D-Dimer, Quant: 1.27 ug/mL-FEU — ABNORMAL HIGH (ref 0.00–0.50)

## 2019-03-27 MED ORDER — INSULIN ASPART 100 UNIT/ML ~~LOC~~ SOLN
16.0000 [IU] | Freq: Three times a day (TID) | SUBCUTANEOUS | Status: DC
  Administered 2019-03-28 – 2019-04-01 (×13): 16 [IU] via SUBCUTANEOUS

## 2019-03-27 MED ORDER — INSULIN ASPART 100 UNIT/ML ~~LOC~~ SOLN
45.0000 [IU] | Freq: Once | SUBCUTANEOUS | Status: AC
  Administered 2019-03-27: 45 [IU] via SUBCUTANEOUS

## 2019-03-27 MED ORDER — INSULIN GLARGINE 100 UNIT/ML ~~LOC~~ SOLN
10.0000 [IU] | Freq: Once | SUBCUTANEOUS | Status: AC
  Administered 2019-03-27: 17:00:00 10 [IU] via SUBCUTANEOUS
  Filled 2019-03-27: qty 0.1

## 2019-03-27 NOTE — Progress Notes (Signed)
PROGRESS NOTE  [name]  CXK:[id number] DOB: Aug 27, 1940 DOA: 03/22/2019 PCP: [name], MD   Brief Narrative: [name] is a 78 y.o. 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 He presented to [place] 9/19 from home where his daughter stays and is covid-positive, with a day of SOB, weakness, having fallen at home and appearing mildly confused. On evaluation he was hypoxic requiring 10L HFNC, SARS-CoV-2 PCR positive, CXR with RML, RLL infiltrates, CRP and d-dimer elevated, PCT negative. Solumedrol, vancomycin, and aztreonam with 1L IVF administered and patient was admitted to [facility], started on remdesivir.   Subjective:  No significant events as discussed earlier with staff, patient himself denies any complaints beside dyspnea and cough   Assessment & Plan: Principal Problem:   Pneumonia due to COVID-19 virus Active Problems:   Acute respiratory failure with hypoxia (HCC)   CAD (coronary artery disease)   Poorly controlled type 2 diabetes mellitus with circulatory disorder (HCC)   AKI (acute kidney injury) ([place])   Chronic diastolic heart failure (HCC)   CKD (chronic kidney disease) stage 3, GFR 30-59 ml/min (HCC)   Sacral decubitus ulcer  Acute hypoxic respiratory failure due to HFWYO-37 pneumonia:  - Certainly at high risk of decompensation/progression to MODS/ARDS.  -He is currently on 4 L nasal cannula, I have encouraged him to use incentive spirometry, and flutter valve, and to get out of bed to chair as long as he can tolerates . - received Remdesivir 9/19 - 9/23 - Continue decadron -Received Convalescent plasma 9/23 -I have discussed Actemra with the patient,and the wife and they are agreeable  if indicated, but CRP has normalized. -Continue to trend inflammatory markers closely, CRP has normalized,  but D-dimers trending up. - Maintain euvolemia/net negative.  - Avoid NSAIDs - Recommend proning and aggressive use of incentive spirometry. - Goals of care were discussed. Prognosis is guarded.   COVID-19 Labs  Recent Labs    03/25/19 0240 03/26/19 0345 03/27/19 0305  DDIMER 0.96* 0.93* 1.27*  FERRITIN 1,151* 1,109*  --   CRP 1.5* 0.9 <0.8    No results found for: SARSCOV2NAA   LFT elevation: -Most likely in the setting of COVID-19 infection, it is trending up, will continue to monitor closely .  AKI on stage III CKD: - Suspect prerenal injury, has continued improvement back to baseline.  Creatinine peaked at Cr 2.2 - Avoid hypotension and nephrotoxins.   Hypernatremia -In the setting IV diuresis, will hold IV Lasix and will encourage to increase fluid intake.  Thrombocytopenia: Chronic, dating back to 2017. Stable/slightly improved again today. Ok to use DVT ppx given high risk and stable/normal hgb.   Lymphopenia: Due to covid viral infection most likely.  - Continue monitoring  IDT2DM:  -CBG remains significantly elevated, I have increased his Lantus to 50 units subcu daily, continue with pre-meal insulin and insulin sliding scale .  CAD s/p CABG: No chest pain, troponin 0.08, paced rhythm without new ischemic changes on ECG.  - EDP discussed with cardiology who recommended no further work up at this time - Continue beta blocker, ASA, statin  Chronic HFpEF: Appears euvolemic - Restart home demadex 40mg  BID now that renal function stabilized and taking normal po.  Fall at home, weakness:  - Not orthostatic-sounding symptoms, suspect weakness due to covid.  - PT/OT evaluations  ongoing, wife arranging 24 hr home care.  Complete heart block : s/p PPM which appears to be functioning appropriately.   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   Unstageable coccyx pressure injury: POA - Offload as able.  - WOC consulted, instituting Gerhart's butt  cream TID.   Mild rhabdomyolysis: vs. covid-myopathy: With hx of fall at home, dipstick positive hgb w/o RBCs on micro and AKI. CK was checked, modestly elevated.  - Has been given fluids with resolution of CK elevation.  Obesity: BMI 38.   DVT prophylaxis: Subcu Lovenox Code Status: Full Family Communication: Wife by phone daily. Disposition Plan: Uncertain, pending clinical course.   Consultants:   None  Procedures:   None  Antimicrobials:  Vancomycin, aztreonam 9/19  Remdesivir 9/19 - 9/23     Objective: Vitals:   03/27/19 0021 03/27/19 0338 03/27/19 0833 03/27/19 0900  BP: 132/85 119/80 110/67   Pulse: 96 94 82   Resp: 19 20 (!) 22   Temp: (!) 97.5 F (36.4 C) 98.3 F (36.8 C) (!) 97.5 F (36.4 C)   TempSrc: Axillary Oral Oral   SpO2: 90% 95% 94% 95%  Weight:      Height:        Intake/Output Summary (Last 24 hours) at 03/27/2019 1339 Last data filed at 03/27/2019 0021 Gross per 24 hour  Intake 480 ml  Output 825 ml  Net -345 ml   Filed Weights   03/22/19 2031 03/23/19 0000 03/23/19 0221  Weight: 104.2 kg 104 kg 104 kg   Awake Alert, Oriented X 3, No new F.N deficits, Normal affect Symmetrical Chest wall movement, Minister entry at the bases, but clear to auscultation, no wheezing. RRR,No Gallops,Rubs or new Murmurs, No Parasternal Heave +ve B.Sounds, Abd Soft, No tenderness, No rebound - guarding or rigidity. No Cyanosis, Clubbing , Trace edema, No new Rash or bruise     Data Reviewed: I have personally reviewed following labs and imaging studies  CBC: Recent Labs  Lab 03/23/19 0211 03/24/19 0345 03/25/19 0240 03/26/19 0345 03/27/19 0305  WBC 1.5* 5.0 5.9 6.4 6.3  NEUTROABS 1.2* 4.3 5.3 5.7 5.4  HGB 13.1 13.3 13.2 14.4 15.5  HCT 43.7 44.5 44.1 47.9 50.9  MCV 94.2 94.9 93.8 93.0 92.4  PLT 95* 112* 123* 161 536   Basic Metabolic Panel: Recent Labs  Lab 03/23/19 0211 03/24/19 0345 03/25/19 0240 03/26/19 0345 03/27/19 0305  NA 140  142 143 149* 150*  K 3.8 3.6 3.7 3.7 3.4*  CL 107 109 108 108 103  CO2 23 24 26  32 34*  GLUCOSE 198* 268* 238* 138* 84  BUN 55* 65* 56* 60* 69*  CREATININE 1.84* 1.59* 1.39* 1.32* 1.54*  CALCIUM 8.1* 8.9 9.1 9.6 10.0   GFR: Estimated Creatinine Clearance: 45.3 mL/min (A) (by C-G formula based on SCr of 1.54 mg/dL (H)). Liver Function Tests: Recent Labs  Lab 03/23/19 0211 03/24/19 0345 03/25/19 0240 03/26/19 0345 03/27/19 0305  AST 65* 61* 52* 66* 104*  ALT 38 47* 49* 78* 147*  ALKPHOS 63 66 74 95 97  BILITOT 0.7 0.8 0.7 0.8 1.1  PROT 6.1* 5.9* 6.2* 7.0 7.5  ALBUMIN 2.8* 2.7* 2.7* 3.2* 3.4*   Cardiac Enzymes: Recent Labs  Lab 03/23/19 0211 03/24/19 0345 03/25/19 0240 03/26/19 0345  CKTOTAL 737* 460* 291 263   CBG: Recent Labs  Lab 03/26/19 1518 03/26/19 2021 03/27/19 0337 03/27/19 0830 03/27/19 1132  GLUCAP 304* 244* 90 113* 324*   Urine analysis:  Component Value Date/Time   COLORURINE YELLOW 03/22/2019 2127   APPEARANCEUR CLOUDY (A) 03/22/2019 2127   LABSPEC 1.013 03/22/2019 2127   PHURINE 5.0 03/22/2019 2127   GLUCOSEU NEGATIVE 03/22/2019 2127   HGBUR MODERATE (A) 03/22/2019 2127   BILIRUBINUR NEGATIVE 03/22/2019 2127   KETONESUR NEGATIVE 03/22/2019 2127   PROTEINUR 30 (A) 03/22/2019 2127   UROBILINOGEN 0.2 07/11/2013 1345   NITRITE NEGATIVE 03/22/2019 2127   LEUKOCYTESUR MODERATE (A) 03/22/2019 2127   Recent Results (from the past 240 hour(s))  MRSA PCR Screening     Status: None   Collection Time: 03/26/19  9:01 AM   Specimen: Nasopharyngeal  Result Value Ref Range Status   MRSA by PCR NEGATIVE NEGATIVE Final    Comment:        The GeneXpert MRSA Assay (FDA approved for NASAL specimens only), is one component of a comprehensive MRSA colonization surveillance program. It is not intended to diagnose MRSA infection nor to guide or monitor treatment for MRSA infections. Performed at [facility], [place] [address],  [place], [place] [address]       Radiology Studies: No results found.  Scheduled Meds: . sodium chloride   Intravenous Once  . allopurinol  300 mg Oral Daily  . aspirin EC  81 mg Oral Daily  . atorvastatin  80 mg Oral QAC breakfast  . bisoprolol  2.5 mg Oral Daily  . calcitRIOL  0.25 mcg Oral Daily  . dexamethasone  6 mg Oral Q24H  . enoxaparin (LOVENOX) injection  50 mg Subcutaneous Q24H  . Gerhardt's butt cream   Topical TID  . insulin aspart  0-20 Units Subcutaneous TID WC  . insulin aspart  0-5 Units Subcutaneous QHS  . insulin aspart  10 Units Subcutaneous TID WC  . insulin glargine  50 Units Subcutaneous Daily  . Ipratropium-Albuterol  1 puff Inhalation Q6H  . pantoprazole  40 mg Oral QHS  . torsemide  40 mg Oral BID   Continuous Infusions:    LOS: 5 days    Phillips Climes, MD [facility] [email or web address] Password [email or web address] 03/27/2019, 1:39 PM

## 2019-03-27 NOTE — Progress Notes (Signed)
Physical Therapy Treatment Patient Details Name: [name] MRN: [id number] DOB: [birth year] Today's Date: 03/27/2019    History of Present Illness 78 y.o. 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 Patient lives with daughter who has [place]. Pt tested COVID +; Had mild confusion at time of presentation.  Also had fallen earlier in day due to generalized weakness. Mild rhabdomyolysis: vs. covid-myopathy. ?pressure area left gluteal cleft    PT Comments    Pt slightly dazed this pm, better with functional mobility and requires less assist but still needing continued cues to complete most tasks given. Able to ambulate approx 64ft x 2 w/ seated rest w/ CGA desats to 84% takes increased time to recover as has difficulty with following cues for pursed lip breathing.    Follow Up Recommendations  Home health PT;Supervision/Assistance - 24 hour     Equipment Recommendations  None recommended by PT    Recommendations for Other Services       Precautions / Restrictions Precautions Precautions: Fall;Other (comment) Precaution Comments: desaturates with activity Restrictions Weight Bearing Restrictions: No    Mobility  Bed Mobility Overal bed mobility: Needs Assistance Bed Mobility: Supine to Sit;Sit to Supine     Supine to sit: Supervision Sit to supine: Supervision   General bed mobility comments: w/ set up and vcs able to complete om his own  Transfers Overall transfer level: Needs assistance Equipment used: Rolling walker (2 wheeled) Transfers: Sit to/from Stand Sit to Stand: Min guard         General transfer comment: vcs for safety and assist w/ line management  Ambulation/Gait Ambulation/Gait assistance: Min guard Gait Distance (Feet): 44 Feet Assistive device: Rolling  walker (2 wheeled) Gait Pattern/deviations: Step-through pattern Gait velocity: slow   General Gait Details: 86ft x 2 w/ seated rest break, desats to 84% w/ ambulation cues needed for pursed lip breathing, has difficulty following cues   Stairs             Wheelchair Mobility    Modified Rankin (Stroke Patients Only)       Balance Overall balance assessment: Needs assistance Sitting-balance support: No upper extremity supported;Feet supported Sitting balance-Leahy Scale: Fair     Standing balance support: Bilateral upper extremity supported Standing balance-Leahy Scale: Fair                              Cognition Arousal/Alertness: Awake/alert Behavior During Therapy: Anxious Overall Cognitive Status: Impaired/Different from baseline Area of Impairment: Orientation;Attention;Memory;Following commands;Safety/judgement;Awareness;Problem solving                 Orientation Level: Person;Place Current Attention Level: Alternating   Following Commands: Follows multi-step commands inconsistently Safety/Judgement: Decreased awareness of safety;Decreased awareness of deficits   Problem Solving: Slow processing;Decreased initiation;Difficulty sequencing;Requires verbal cues General Comments: still needs continued cues to complete tasks, he does get rsetless and impulsive hence continued reinforcement of cues given      Exercises      General Comments General comments (skin integrity, edema, etc.): does well with mobility this pm but seems dazed needing continued cues to complete tasks. also has great difficulty with following cues for pursed lip breathing.      Pertinent Vitals/Pain Pain Assessment: No/denies pain    Home Living  Prior Function            PT Goals (current goals can now be found in the care plan section) Acute Rehab PT Goals Patient Stated Goal: to get stronger Time For Goal Achievement:  04/07/19 Potential to Achieve Goals: Good Progress towards PT goals: Progressing toward goals    Frequency    Min 3X/week      PT Plan Current plan remains appropriate    Co-evaluation              AM-PAC PT "6 Clicks" Mobility   Outcome Measure  Help needed turning from your back to your side while in a flat bed without using bedrails?: None Help needed moving from lying on your back to sitting on the side of a flat bed without using bedrails?: A Little Help needed moving to and from a bed to a chair (including a wheelchair)?: A Little Help needed standing up from a chair using your arms (e.g., wheelchair or bedside chair)?: A Little Help needed to walk in hospital room?: A Little Help needed climbing 3-5 steps with a railing? : A Lot 6 Click Score: 18    End of Session Equipment Utilized During Treatment: Oxygen Activity Tolerance: Treatment limited secondary to medical complications (Comment);Patient limited by lethargy Patient left: in bed;with call bell/phone within reach Nurse Communication: Mobility status;Precautions PT Visit Diagnosis: Difficulty in walking, not elsewhere classified (R26.2);Unsteadiness on feet (R26.81)     Time: 0973-5329 PT Time Calculation (min) (ACUTE ONLY): 18 min  Charges:  $Gait Training: 8-22 mins                     Horald Chestnut, PT    Delford Field 03/27/2019, 4:23 PM

## 2019-03-27 NOTE — Progress Notes (Addendum)
1144: Call to patient wife. No answer. Mailbox is full, unable to leave VM. Called X2.  1251: Call to wife Peggy. Updated on patient condition.    1600: Patient up in room walking with therapy.  1605: Page to MD. Patient BG [phone].

## 2019-03-27 NOTE — Plan of Care (Signed)
  Problem: Education: Goal: Knowledge of risk factors and measures for prevention of condition will improve Outcome: Progressing   Problem: Coping: Goal: Psychosocial and spiritual needs will be supported Outcome: Progressing   Problem: Respiratory: Goal: Will maintain a patent airway Outcome: Progressing Goal: Complications related to the disease process, condition or treatment will be avoided or minimized Outcome: Progressing   

## 2019-03-28 ENCOUNTER — Ambulatory Visit: Payer: Medicare Other | Admitting: Internal Medicine

## 2019-03-28 LAB — COMPREHENSIVE METABOLIC PANEL
ALT: 136 U/L — ABNORMAL HIGH (ref 0–44)
AST: 56 U/L — ABNORMAL HIGH (ref 15–41)
Albumin: 3 g/dL — ABNORMAL LOW (ref 3.5–5.0)
Alkaline Phosphatase: 92 U/L (ref 38–126)
Anion gap: 15 (ref 5–15)
BUN: 66 mg/dL — ABNORMAL HIGH (ref 8–23)
CO2: 34 mmol/L — ABNORMAL HIGH (ref 22–32)
Calcium: 9.7 mg/dL (ref 8.9–10.3)
Chloride: 100 mmol/L (ref 98–111)
Creatinine, Ser: 1.64 mg/dL — ABNORMAL HIGH (ref 0.61–1.24)
GFR calc Af Amer: 46 mL/min — ABNORMAL LOW (ref >60)
GFR calc non Af Amer: 40 mL/min — ABNORMAL LOW (ref >60)
Glucose, Bld: 69 mg/dL — ABNORMAL LOW (ref 70–99)
Potassium: 3.1 mmol/L — ABNORMAL LOW (ref 3.5–5.1)
Sodium: 149 mmol/L — ABNORMAL HIGH (ref 135–145)
Total Bilirubin: 1.1 mg/dL (ref 0.3–1.2)
Total Protein: 6.7 g/dL (ref 6.5–8.1)

## 2019-03-28 LAB — CBC
HCT: 49.7 % (ref 39.0–52.0)
Hemoglobin: 15.3 g/dL (ref 13.0–17.0)
MCH: 28.5 pg (ref 26.0–34.0)
MCHC: 30.8 g/dL (ref 30.0–36.0)
MCV: 92.6 fL (ref 80.0–100.0)
Platelets: 166 10*3/uL (ref 150–400)
RBC: 5.37 MIL/uL (ref 4.22–5.81)
RDW: 17.2 % — ABNORMAL HIGH (ref 11.5–15.5)
WBC: 8.4 10*3/uL (ref 4.0–10.5)
nRBC: 0.2 % (ref 0.0–0.2)

## 2019-03-28 LAB — GLUCOSE, CAPILLARY
Glucose-Capillary: 106 mg/dL — ABNORMAL HIGH (ref 70–99)
Glucose-Capillary: 67 mg/dL — ABNORMAL LOW (ref 70–99)
Glucose-Capillary: 247 mg/dL — ABNORMAL HIGH (ref 70–99)
Glucose-Capillary: 319 mg/dL — ABNORMAL HIGH (ref 70–99)

## 2019-03-28 MED ORDER — POTASSIUM CHLORIDE CRYS ER 20 MEQ PO TBCR
40.0000 meq | EXTENDED_RELEASE_TABLET | Freq: Four times a day (QID) | ORAL | Status: AC
  Administered 2019-03-28 (×2): 40 meq via ORAL
  Filled 2019-03-28 (×2): qty 2

## 2019-03-28 MED ORDER — TORSEMIDE 20 MG PO TABS
40.0000 mg | ORAL_TABLET | Freq: Two times a day (BID) | ORAL | Status: DC
  Administered 2019-03-29: 09:00:00 40 mg via ORAL
  Filled 2019-03-28 (×3): qty 2

## 2019-03-28 MED ORDER — INSULIN GLARGINE 100 UNIT/ML ~~LOC~~ SOLN: 70.0000 [IU] | Freq: Every day | SUBCUTANEOUS | Status: DC

## 2019-03-28 MED ORDER — INSULIN GLARGINE 100 UNIT/ML ~~LOC~~ SOLN
75.0000 [IU] | Freq: Every day | SUBCUTANEOUS | Status: DC
  Administered 2019-03-28 – 2019-04-01 (×5): 75 [IU] via SUBCUTANEOUS
  Filled 2019-03-28 (×5): qty 0.75

## 2019-03-28 NOTE — Progress Notes (Signed)
PROGRESS NOTE  [name]  WER:[id number] DOB: January 19, 1941 DOA: 03/22/2019 PCP: [name], MD   Brief Narrative: [name] is a 78 y.o. 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 He presented to [place] 9/19 from home where his daughter stays and is covid-positive, with a day of SOB, weakness, having fallen at home and appearing mildly confused. On evaluation he was hypoxic requiring 10L HFNC, SARS-CoV-2 PCR positive, CXR with RML, RLL infiltrates, CRP and d-dimer elevated, PCT negative. Solumedrol, vancomycin, and aztreonam with 1L IVF administered and patient was admitted to [facility], started on remdesivir.   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  -Morning he was on 3 L nasal cannula, he was encouraged use incentive spirometry, flutter valve, to get out of bed to chair as long as he can tolerate . - received Remdesivir 9/19 - 9/23 - Continue decadron -Received Convalescent plasma 9/23 -I have discussed Actemra with the patient,and the wife and they are agreeable  if indicated, but CRP has normalized. -Continue to trend inflammatory markers CRP has normalized, but D-dimers slightly trending up, will continue  to monitor closely. - Maintain euvolemia/net negative.  - Avoid NSAIDs - Recommend proning and aggressive use of incentive spirometry. - Goals of care were discussed. Prognosis is guarded.   COVID-19 Labs  Recent Labs    03/26/19 0345 03/27/19 0305  DDIMER 0.93* 1.27*  FERRITIN 1,109*  --   CRP 0.9 <0.8    No results found for: SARSCOV2NAA   LFT elevation: -Most likely in the setting of COVID-19 infection, it is trending up, will continue to monitor closely .  AKI on stage III CKD: - Suspect prerenal injury, has continued improvement back to baseline.  Creatinine peaked at Cr 2.2 - Avoid hypotension and nephrotoxins.   Hypernatremia -In the setting IV diuresis, continue to hold IV Lasix, have encouraged him to increase his fluid intake, it is with some mild improvement today at 149.  We will hold evening dose of Demadex  Thrombocytopenia: Chronic, dating back to 2017. Stable/slightly improved again today. Ok to use DVT ppx given high risk and stable/normal hgb.   Lymphopenia: Due to covid viral infection most likely.  - Continue monitoring  IDT2DM:  -CBG remains significantly elevated, I have increased his Lantus to 50 units subcu daily, continue with pre-meal insulin and insulin sliding scale .  CAD s/p CABG: No chest pain, troponin 0.08, paced rhythm without new ischemic changes on ECG.  - EDP discussed with cardiology who recommended no further work up at this time - Continue beta blocker, ASA, statin  Chronic HFpEF: Appears euvolemic - Restart home demadex 40mg  BID now that renal function stabilized and taking normal po.  Hold evening dose  of Demadex given hypernatremia  Fall at home, weakness:  - Not orthostatic-sounding symptoms, suspect weakness due to covid.  - PT/OT evaluations ongoing, wife arranging 24 hr home care.  Complete heart block : s/p PPM which appears to be functioning appropriately.   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    Unstageable coccyx pressure injury: POA - Offload as able.  - WOC consulted, instituting Gerhart's butt cream TID.   Mild rhabdomyolysis: vs. covid-myopathy: With hx of fall at home, dipstick positive hgb w/o RBCs on micro and AKI. CK was checked, modestly elevated.  - Has been given fluids with resolution of CK elevation.  Obesity: BMI 38.   DVT prophylaxis: Subcu Lovenox Code Status: Full Family Communication: Wife by phone daily. Disposition Plan: Uncertain, pending clinical course.   Consultants:   None  Procedures:   None  Antimicrobials:  Vancomycin, aztreonam 9/19  Remdesivir 9/19 - 9/23     Objective: Vitals:   03/27/19 1700 03/27/19 1909 03/28/19 0424 03/28/19 0740  BP:  125/76 114/79 130/69  Pulse:  (!) 101 78 85  Resp:  19 20 18   Temp:  98.2 F (36.8 C) 98.1 F (36.7 C) 97.8 F (36.6 C)  TempSrc:  Axillary Axillary Axillary  SpO2: 95% 95% 92% 91%  Weight:      Height:        Intake/Output Summary (Last 24 hours) at 03/28/2019 1349 Last data filed at 03/28/2019 0830 Gross per 24 hour  Intake 960 ml  Output 1350 ml  Net -390 ml   Filed Weights   03/22/19 2031 03/23/19 0000 03/23/19 0221  Weight: 104.2 kg 104 kg 104 kg    Awake Alert, Oriented X 3, No new F.N deficits, Normal affect Symmetrical Chest wall movement, diminished air entry at the bases, clear to auscultation RRR,No Gallops,Rubs or new Murmurs, No Parasternal Heave +ve B.Sounds, Abd Soft, No tenderness, No rebound - guarding or rigidity. No Cyanosis, Clubbing or edema, No new Rash or bruise      Data Reviewed: I have personally reviewed following labs and imaging studies  CBC: Recent Labs  Lab 03/23/19 0211 03/24/19 0345 03/25/19 0240 03/26/19 0345 03/27/19 0305 03/28/19 0348  WBC 1.5* 5.0 5.9 6.4 6.3 8.4  NEUTROABS 1.2* 4.3 5.3 5.7 5.4  --   HGB 13.1 13.3 13.2 14.4 15.5 15.3  HCT 43.7 44.5 44.1 47.9 50.9 49.7  MCV 94.2 94.9 93.8 93.0 92.4 92.6  PLT 95* 112* 123*  161 151 494   Basic Metabolic Panel: Recent Labs  Lab 03/24/19 0345 03/25/19 0240 03/26/19 0345 03/27/19 0305 03/28/19 0348  NA 142 143 149* 150* 149*  K 3.6 3.7 3.7 3.4* 3.1*  CL 109 108 108 103 100  CO2 24 26 32 34* 34*  GLUCOSE 268* 238* 138* 84 69*  BUN 65* 56* 60* 69* 66*  CREATININE 1.59* 1.39* 1.32* 1.54* 1.64*  CALCIUM 8.9 9.1 9.6 10.0 9.7   GFR: Estimated Creatinine Clearance: 42.5 mL/min (A) (by C-G formula based on SCr of 1.64 mg/dL (H)). Liver Function Tests: Recent Labs  Lab 03/24/19 0345 03/25/19 0240 03/26/19 0345 03/27/19 0305 03/28/19 0348  AST 61* 52* 66* 104* 56*  ALT 47* 49* 78* 147* 136*  ALKPHOS 66 74 95 97 92  BILITOT 0.8 0.7 0.8 1.1 1.1  PROT 5.9* 6.2* 7.0 7.5 6.7  ALBUMIN 2.7* 2.7* 3.2* 3.4* 3.0*   Cardiac Enzymes: Recent Labs  Lab 03/23/19 0211 03/24/19 0345 03/25/19 0240 03/26/19 0345  CKTOTAL 737* 460*  291 263   CBG: Recent Labs  Lab 03/27/19 1950 03/27/19 2122 03/28/19 0241 03/28/19 0752 03/28/19 1044  GLUCAP 357* 281* 106* 67* 247*   Urine analysis:    Component Value Date/Time   COLORURINE YELLOW 03/22/2019 2127   APPEARANCEUR CLOUDY (A) 03/22/2019 2127   LABSPEC 1.013 03/22/2019 2127   PHURINE 5.0 03/22/2019 2127   GLUCOSEU NEGATIVE 03/22/2019 2127   HGBUR MODERATE (A) 03/22/2019 2127   BILIRUBINUR NEGATIVE 03/22/2019 2127   KETONESUR NEGATIVE 03/22/2019 2127   PROTEINUR 30 (A) 03/22/2019 2127   UROBILINOGEN 0.2 07/11/2013 1345   NITRITE NEGATIVE 03/22/2019 2127   LEUKOCYTESUR MODERATE (A) 03/22/2019 2127   Recent Results (from the past 240 hour(s))  MRSA PCR Screening     Status: None   Collection Time: 03/26/19  9:01 AM   Specimen: Nasopharyngeal  Result Value Ref Range Status   MRSA by PCR NEGATIVE NEGATIVE Final    Comment:        The GeneXpert MRSA Assay (FDA approved for NASAL specimens only), is one component of a comprehensive MRSA colonization surveillance program. It is not intended to  diagnose MRSA infection nor to guide or monitor treatment for MRSA infections. Performed at [facility], [place] [address] [place], [place] [address]       Radiology Studies: No results found.  Scheduled Meds: . sodium chloride   Intravenous Once  . allopurinol  300 mg Oral Daily  . aspirin EC  81 mg Oral Daily  . atorvastatin  80 mg Oral QAC breakfast  . bisoprolol  2.5 mg Oral Daily  . calcitRIOL  0.25 mcg Oral Daily  . dexamethasone  6 mg Oral Q24H  . enoxaparin (LOVENOX) injection  50 mg Subcutaneous Q24H  . Gerhardt's butt cream   Topical TID  . insulin aspart  0-20 Units Subcutaneous TID WC  . insulin aspart  0-5 Units Subcutaneous QHS  . insulin aspart  16 Units Subcutaneous TID WC  . insulin glargine  75 Units Subcutaneous Daily  . Ipratropium-Albuterol  1 puff Inhalation Q6H  . pantoprazole  40 mg Oral QHS  . potassium chloride  40 mEq Oral Q6H  . torsemide  40 mg Oral BID   Continuous Infusions:    LOS: 6 days    Phillips Climes, MD [facility] [email or web address] Password [email or web address] 03/28/2019, 1:49 PM

## 2019-03-28 NOTE — Care Management Important Message (Signed)
Important Message  Patient Details  Name: [name] MRN: [id number] Date of Birth: 1940/11/28   Medicare Important Message Given:  Yes - Important Message mailed due to current National Emergency  Verbal consent obtained due to current National Emergency    Contact Name: Jahrel Borthwick Call Date: 03/28/19  Time: 1518 Phone: [phone] Outcome: Spoke with contact Important Message mailed to: Patient address on file    Delorse Lek 03/28/2019, 3:19 PM

## 2019-03-28 NOTE — Plan of Care (Signed)
Will continue with plan of care. 

## 2019-03-28 NOTE — Progress Notes (Signed)
Physical Therapy Treatment Patient Details Name: [name] MRN: [id number] DOB: [birth year] Today's Date: 03/28/2019    History of Present Illness 78 y.o. 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 Patient lives with daughter who has [place]. Pt tested COVID +; Had mild confusion at time of presentation.  Also had fallen earlier in day due to generalized weakness. Mild rhabdomyolysis: vs. covid-myopathy. ?pressure area left gluteal cleft    PT Comments    Pt slightly more alert this am, but very impulsive. Was able to work through some exercises but needed verbal, visual and at times tactile cues to complete. Balance also slightly improved this am, pt needing less assistance with mobility.   Follow Up Recommendations  Home health PT;Supervision/Assistance - 24 hour     Equipment Recommendations  None recommended by PT    Recommendations for Other Services       Precautions / Restrictions Precautions Precautions: Fall;Other (comment) Precaution Comments: cognition very limited also desats with activity and is very unaware Restrictions Weight Bearing Restrictions: No    Mobility  Bed Mobility Overal bed mobility: Needs Assistance Bed Mobility: Supine to Sit;Sit to Supine     Supine to sit: Supervision Sit to supine: Supervision   General bed mobility comments: w/ set up and vcs able to complete om his own  Transfers Overall transfer level: Needs assistance Equipment used: Rolling walker (2 wheeled) Transfers: Sit to/from Stand Sit to Stand: Supervision Stand pivot transfers: Min guard       General transfer comment: vcs for safety and assist w/ line management  Ambulation/Gait Ambulation/Gait assistance: Min guard Gait Distance (Feet): 22 Feet Assistive device: Rolling  walker (2 wheeled) Gait Pattern/deviations: Step-through pattern Gait velocity: slow       Stairs             Wheelchair Mobility    Modified Rankin (Stroke Patients Only)       Balance Overall balance assessment: Needs assistance Sitting-balance support: Feet supported;No upper extremity supported Sitting balance-Leahy Scale: Fair     Standing balance support: Bilateral upper extremity supported Standing balance-Leahy Scale: Fair Standing balance comment: able to stand without extrenal support today                            Cognition Arousal/Alertness: Lethargic Behavior During Therapy: Impulsive Overall Cognitive Status: Impaired/Different from baseline Area of Impairment: Orientation;Attention;Memory;Following commands;Safety/judgement;Awareness;Problem solving                 Orientation Level: Person Current Attention Level: Alternating   Following Commands: Follows one step commands inconsistently Safety/Judgement: Decreased awareness of safety;Decreased awareness of deficits   Problem Solving: Slow processing;Decreased initiation;Difficulty sequencing;Requires verbal cues General Comments: pt highly impulsive this am, needing stern and continued cues to complete tasks. Desats and still having diffiuclty with following cues for pursed lip breathing.      Exercises General Exercises - Lower Extremity Long Arc Quad: 20 reps Hip Flexion/Marching: 20 reps Toe Raises: 20 reps Heel Raises: 20 reps    General Comments General comments (skin integrity, edema, etc.): has diffiuclty following cues to complete seated ther ex needing tactile cues to complete      Pertinent Vitals/Pain Pain Assessment: Faces Faces Pain Scale: No hurt    Home Living  Prior Function            PT Goals (current goals can now be found in the care plan section) Acute Rehab PT Goals Time For Goal Achievement:  04/07/19 Potential to Achieve Goals: Good Progress towards PT goals: Progressing toward goals    Frequency    Min 3X/week      PT Plan Current plan remains appropriate    Co-evaluation              AM-PAC PT "6 Clicks" Mobility   Outcome Measure  Help needed turning from your back to your side while in a flat bed without using bedrails?: None Help needed moving from lying on your back to sitting on the side of a flat bed without using bedrails?: A Little Help needed moving to and from a bed to a chair (including a wheelchair)?: A Little Help needed standing up from a chair using your arms (e.g., wheelchair or bedside chair)?: A Little Help needed to walk in hospital room?: A Little Help needed climbing 3-5 steps with a railing? : A Lot 6 Click Score: 18    End of Session Equipment Utilized During Treatment: Oxygen Activity Tolerance: Treatment limited secondary to medical complications (Comment) Patient left: in chair;with call bell/phone within reach;with nursing/sitter in room;with chair alarm set   PT Visit Diagnosis: Difficulty in walking, not elsewhere classified (R26.2);Unsteadiness on feet (R26.81)     Time: 9450-3888 PT Time Calculation (min) (ACUTE ONLY): 30 min  Charges:  $Gait Training: 8-22 mins $Therapeutic Exercise: 8-22 mins                     Horald Chestnut, PT    Delford Field 03/28/2019, 1:17 PM

## 2019-03-29 LAB — COMPREHENSIVE METABOLIC PANEL
ALT: 102 U/L — ABNORMAL HIGH (ref 0–44)
AST: 33 U/L (ref 15–41)
Albumin: 3 g/dL — ABNORMAL LOW (ref 3.5–5.0)
Alkaline Phosphatase: 86 U/L (ref 38–126)
Anion gap: 11 (ref 5–15)
BUN: 72 mg/dL — ABNORMAL HIGH (ref 8–23)
CO2: 32 mmol/L (ref 22–32)
Calcium: 9.8 mg/dL (ref 8.9–10.3)
Chloride: 103 mmol/L (ref 98–111)
Creatinine, Ser: 1.6 mg/dL — ABNORMAL HIGH (ref 0.61–1.24)
GFR calc Af Amer: 48 mL/min — ABNORMAL LOW (ref >60)
GFR calc non Af Amer: 41 mL/min — ABNORMAL LOW (ref >60)
Glucose, Bld: 116 mg/dL — ABNORMAL HIGH (ref 70–99)
Potassium: 4.6 mmol/L (ref 3.5–5.1)
Sodium: 146 mmol/L — ABNORMAL HIGH (ref 135–145)
Total Bilirubin: 1.1 mg/dL (ref 0.3–1.2)
Total Protein: 6.5 g/dL (ref 6.5–8.1)

## 2019-03-29 LAB — CBC
HCT: 48.3 % (ref 39.0–52.0)
Hemoglobin: 14.6 g/dL (ref 13.0–17.0)
MCH: 28.1 pg (ref 26.0–34.0)
MCHC: 30.2 g/dL (ref 30.0–36.0)
MCV: 92.9 fL (ref 80.0–100.0)
Platelets: 158 10*3/uL (ref 150–400)
RBC: 5.2 MIL/uL (ref 4.22–5.81)
RDW: 17.3 % — ABNORMAL HIGH (ref 11.5–15.5)
WBC: 7.5 10*3/uL (ref 4.0–10.5)
nRBC: 0 % (ref 0.0–0.2)

## 2019-03-29 LAB — GLUCOSE, CAPILLARY
Glucose-Capillary: 138 mg/dL — ABNORMAL HIGH (ref 70–99)
Glucose-Capillary: 75 mg/dL (ref 70–99)
Glucose-Capillary: 321 mg/dL — ABNORMAL HIGH (ref 70–99)
Glucose-Capillary: 296 mg/dL — ABNORMAL HIGH (ref 70–99)

## 2019-03-29 MED ORDER — TORSEMIDE 20 MG PO TABS
40.0000 mg | ORAL_TABLET | Freq: Two times a day (BID) | ORAL | Status: DC
  Administered 2019-03-30 – 2019-03-31 (×3): 40 mg via ORAL
  Filled 2019-03-29 (×5): qty 2

## 2019-03-29 NOTE — Progress Notes (Signed)
PROGRESS NOTE  [name]  NFA:[id number] DOB: Nov 24, 1940 DOA: 03/22/2019 PCP: [name], MD   Brief Narrative: [name] is a 78 y.o. 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 He presented to [place] 9/19 from home where his daughter stays and is covid-positive, with a day of SOB, weakness, having fallen at home and appearing mildly confused. On evaluation he was hypoxic requiring 10L HFNC, SARS-CoV-2 PCR positive, CXR with RML, RLL infiltrates, CRP and d-dimer elevated, PCT negative. Solumedrol, vancomycin, and aztreonam with 1L IVF administered and patient was admitted to [facility], started on remdesivir.   Subjective:  Patient denies any fever, chest pain today, reports dyspnea only on exertion, still complains of cough..   Assessment & Plan: Principal Problem:   Pneumonia due to COVID-19 virus Active Problems:   Acute respiratory failure with hypoxia (HCC)   CAD (coronary artery disease)   Poorly controlled type 2 diabetes mellitus with circulatory disorder (HCC)   AKI (acute kidney injury) (HCC)   Chronic diastolic heart failure (HCC)   CKD (chronic kidney disease) stage 3, GFR 30-59 ml/min (HCC)   Sacral decubitus ulcer  Acute hypoxic respiratory failure due to IONGE-95 pneumonia:  - Certainly at high risk of decompensation/progression to MODS/ARDS.  -Still on oxygen, requiring 3 L nasal cannula, he has been encouraged again to keep using his incentive spirometry, and flutter valve, and to get out of bed to chair . - received Remdesivir 9/19 - 9/23 - Continue decadron -Received Convalescent plasma 9/23 -I have discussed Actemra with the patient,and the wife and they are agreeable  if indicated, but CRP has normalized. - CRP has normalized, but D-dimers slightly trending up, will continue to monitor  closely. - Maintain euvolemia/net negative.  - Avoid NSAIDs - Recommend proning and aggressive use of incentive spirometry. - Goals of care were discussed. Prognosis is guarded.   COVID-19 Labs  Recent Labs    03/27/19 0305  DDIMER 1.27*  CRP <0.8    No results found for: SARSCOV2NAA   LFT elevation: -Most likely in the setting of COVID-19 infection, currently trending down, continue to monitor  AKI on stage III CKD: - Suspect prerenal injury, has continued improvement back to baseline.  Creatinine peaked at Cr 2.2 - Avoid hypotension and nephrotoxins.   Hypernatremia -In the setting IV diuresis, and Demadex use, it is improving, at 146 today, he was encouraged to increase his fluid intake, hold evening dose of Demadex   Thrombocytopenia: Chronic, dating back to 2017. Stable/slightly improved again today. Ok to use DVT ppx given high risk and stable/normal hgb.   Lymphopenia: Due to covid viral infection most likely.  - Continue monitoring  IDT2DM:  -CBG remains significantly elevated, I have increased his Lantus to 50 units subcu daily, continue with pre-meal insulin and insulin sliding scale .  CAD s/p CABG: No chest pain, troponin 0.08, paced rhythm without new ischemic changes on ECG.  - EDP discussed with cardiology who recommended no further work up at this time - Continue beta blocker, ASA, statin  Chronic HFpEF: Appears euvolemic - Restart home demadex 40mg  BID now that renal function stabilized and taking normal po.  Hold evening dose of Demadex given hypernatremia  Fall at home, weakness:  - Not orthostatic-sounding symptoms, suspect weakness due to covid.  - PT/OT evaluations ongoing, wife  arranging 24 hr home care.  Complete heart block : s/p PPM which appears to be functioning appropriately.   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   Unstageable coccyx pressure injury: POA - Offload as able.  - WOC consulted, instituting Gerhart's  butt cream TID.   Mild rhabdomyolysis: vs. covid-myopathy: With hx of fall at home, dipstick positive hgb w/o RBCs on micro and AKI. CK was checked, modestly elevated.  - Has been given fluids with resolution of CK elevation.  Hypokalemia -Repleted  Obesity: BMI 38.   DVT prophylaxis: Subcu Lovenox Code Status: Full Family Communication: Wife by phone. Disposition Plan: Home with home health  Consultants:   None  Procedures:   None  Antimicrobials:  Vancomycin, aztreonam 9/19  Remdesivir 9/19 - 9/23     Objective: Vitals:   03/28/19 1550 03/28/19 1909 03/29/19 0425 03/29/19 0800  BP: 110/63 118/86 (!) 137/91 123/75  Pulse:  (!) 101 81 81  Resp: 18 19 16    Temp: 97.8 F (36.6 C) 97.9 F (36.6 C) 98.2 F (36.8 C) 98 F (36.7 C)  TempSrc: Oral Oral Oral Oral  SpO2:  95% 94% 97%  Weight:      Height:        Intake/Output Summary (Last 24 hours) at 03/29/2019 1443 Last data filed at 03/29/2019 0144 Gross per 24 hour  Intake -  Output 550 ml  Net -550 ml   Filed Weights   03/22/19 2031 03/23/19 0000 03/23/19 0221  Weight: 104.2 kg 104 kg 104 kg    Awake Alert, Oriented X 3, No new F.N deficits, Normal affect Symmetrical Chest wall movement, Good air movement bilaterally, CTAB RRR,No Gallops,Rubs or new Murmurs, No Parasternal Heave +ve B.Sounds, Abd Soft, No tenderness, No rebound - guarding or rigidity. No Cyanosis, Clubbing or edema, No new Rash or bruise      Data Reviewed: I have personally reviewed following labs and imaging studies  CBC: Recent Labs  Lab 03/23/19 0211 03/24/19 0345 03/25/19 0240 03/26/19 0345 03/27/19 0305 03/28/19 0348 03/29/19 0316  WBC 1.5* 5.0 5.9 6.4 6.3 8.4 7.5  NEUTROABS 1.2* 4.3 5.3 5.7 5.4  --   --   HGB 13.1 13.3 13.2 14.4 15.5 15.3 14.6  HCT 43.7 44.5 44.1 47.9 50.9 49.7 48.3  MCV 94.2 94.9 93.8 93.0 92.4 92.6 92.9  PLT 95* 112* 123* 161 151 166 785   Basic Metabolic Panel: Recent Labs  Lab 03/25/19  0240 03/26/19 0345 03/27/19 0305 03/28/19 0348 03/29/19 0316  NA 143 149* 150* 149* 146*  K 3.7 3.7 3.4* 3.1* 4.6  CL 108 108 103 100 103  CO2 26 32 34* 34* 32  GLUCOSE 238* 138* 84 69* 116*  BUN 56* 60* 69* 66* 72*  CREATININE 1.39* 1.32* 1.54* 1.64* 1.60*  CALCIUM 9.1 9.6 10.0 9.7 9.8   GFR: Estimated Creatinine Clearance: 43.6 mL/min (A) (by C-G formula based on SCr of 1.6 mg/dL (H)). Liver Function Tests: Recent Labs  Lab 03/25/19 0240 03/26/19 0345 03/27/19 0305 03/28/19 0348 03/29/19 0316  AST 52* 66* 104* 56* 33  ALT 49* 78* 147* 136* 102*  ALKPHOS 74 95 97 92 86  BILITOT 0.7 0.8 1.1 1.1 1.1  PROT 6.2* 7.0 7.5 6.7 6.5  ALBUMIN 2.7* 3.2* 3.4* 3.0* 3.0*   Cardiac Enzymes: Recent Labs  Lab 03/23/19 0211 03/24/19 0345 03/25/19 0240 03/26/19 0345  CKTOTAL 737* 460* 291 263   CBG: Recent Labs  Lab 03/28/19 0752 03/28/19 1044 03/28/19 1643  03/29/19 0230 03/29/19 0746  GLUCAP 67* 247* 319* 138* 75   Urine analysis:    Component Value Date/Time   COLORURINE YELLOW 03/22/2019 2127   APPEARANCEUR CLOUDY (A) 03/22/2019 2127   LABSPEC 1.013 03/22/2019 2127   PHURINE 5.0 03/22/2019 2127   GLUCOSEU NEGATIVE 03/22/2019 2127   HGBUR MODERATE (A) 03/22/2019 2127   BILIRUBINUR NEGATIVE 03/22/2019 2127   KETONESUR NEGATIVE 03/22/2019 2127   PROTEINUR 30 (A) 03/22/2019 2127   UROBILINOGEN 0.2 07/11/2013 1345   NITRITE NEGATIVE 03/22/2019 2127   LEUKOCYTESUR MODERATE (A) 03/22/2019 2127   Recent Results (from the past 240 hour(s))  MRSA PCR Screening     Status: None   Collection Time: 03/26/19  9:01 AM   Specimen: Nasopharyngeal  Result Value Ref Range Status   MRSA by PCR NEGATIVE NEGATIVE Final    Comment:        The GeneXpert MRSA Assay (FDA approved for NASAL specimens only), is one component of a comprehensive MRSA colonization surveillance program. It is not intended to diagnose MRSA infection nor to guide or monitor treatment for MRSA  infections. Performed at [facility], [place] [address], [place], [place] [address]   Culture, Urine     Status: Abnormal (Preliminary result)   Collection Time: 03/28/19  2:00 AM   Specimen: Urine, Random  Result Value Ref Range Status   Specimen Description   Final    URINE, RANDOM Performed at [facility] [address] [place], [place] [address]    Special Requests   Final    NONE Performed at [facility], [place] [address], [place], [place] [address]    Culture (A)  Final    30,000 COLONIES/mL ESCHERICHIA COLI SUSCEPTIBILITIES TO FOLLOW Performed at [facility], [place] [address], [place], [place] [address]    Report Status PENDING  Incomplete      Radiology Studies: No results found.  Scheduled Meds: . sodium chloride   Intravenous Once  . allopurinol  300 mg Oral Daily  . aspirin EC  81 mg Oral Daily  . atorvastatin  80 mg Oral QAC breakfast  . bisoprolol  2.5 mg Oral Daily  . calcitRIOL  0.25 mcg Oral Daily  . dexamethasone  6 mg Oral Q24H  . enoxaparin (LOVENOX) injection  50 mg Subcutaneous Q24H  . Gerhardt's butt cream   Topical TID  . insulin aspart  0-20 Units Subcutaneous TID WC  . insulin aspart  0-5 Units Subcutaneous QHS  . insulin aspart  16 Units Subcutaneous TID WC  . insulin glargine  75 Units Subcutaneous Daily  . Ipratropium-Albuterol  1 puff Inhalation Q6H  . pantoprazole  40 mg Oral QHS  . torsemide  40 mg Oral BID   Continuous Infusions:    LOS: 7 days    Phillips Climes, MD [facility] [email or web address] Password [facility] 03/29/2019, 2:43 PM

## 2019-03-29 NOTE — TOC Progression Note (Signed)
Transition of Care [facility]) - Progression Note    Patient Details  Name: [name] MRN: [id number] Date of Birth: Nov 21, 1940  Transition of Care [facility]) CM/SW Contact  Loletha Grayer Beverely Pace, RN Phone Number: [phone] (working remotely) 03/29/2019, 9:51 AM  Clinical Narrative:  Case manager followed up with patient's wife [name] to discuss discharge needs and to confirm the address they will be going to. Choice was offered on 9/23, Referral was called to Adela Lank, [facility] Liaison. Should patient need oxygen at discharge [facility] will be contacted. Patient and wife will be going to their daughter's home during his recovery: [address], [place], [place]. Case manager will continue to follow.    Expected Discharge Plan: [place] Barriers to Discharge: Continued Medical Work up  Expected Discharge Plan and Services Expected Discharge Plan: [place]   Discharge Planning Services: CM Consult Post Acute Care Choice: [place] arrangements for the past 2 months: Single Family Home                 DME Arranged: N/A DME Agency: NA       HH Arranged: PT, OT HH Agency: [place] Date [place]: 03/29/19 Time [place]: 0945 Representative spoke with at [place]: Adela Lank   Social Determinants of Health (SDOH) Interventions    Readmission Risk Interventions No flowsheet data found.

## 2019-03-30 LAB — CBC
HCT: 48.9 % (ref 39.0–52.0)
Hemoglobin: 14.7 g/dL (ref 13.0–17.0)
MCH: 28.1 pg (ref 26.0–34.0)
MCHC: 30.1 g/dL (ref 30.0–36.0)
MCV: 93.5 fL (ref 80.0–100.0)
Platelets: 152 10*3/uL (ref 150–400)
RBC: 5.23 MIL/uL (ref 4.22–5.81)
RDW: 17.3 % — ABNORMAL HIGH (ref 11.5–15.5)
WBC: 8.1 10*3/uL (ref 4.0–10.5)
nRBC: 0 % (ref 0.0–0.2)

## 2019-03-30 LAB — COMPREHENSIVE METABOLIC PANEL
ALT: 84 U/L — ABNORMAL HIGH (ref 0–44)
AST: 27 U/L (ref 15–41)
Albumin: 2.9 g/dL — ABNORMAL LOW (ref 3.5–5.0)
Alkaline Phosphatase: 89 U/L (ref 38–126)
Anion gap: 8 (ref 5–15)
BUN: 71 mg/dL — ABNORMAL HIGH (ref 8–23)
CO2: 31 mmol/L (ref 22–32)
Calcium: 10 mg/dL (ref 8.9–10.3)
Chloride: 104 mmol/L (ref 98–111)
Creatinine, Ser: 1.36 mg/dL — ABNORMAL HIGH (ref 0.61–1.24)
GFR calc Af Amer: 58 mL/min — ABNORMAL LOW (ref >60)
GFR calc non Af Amer: 50 mL/min — ABNORMAL LOW (ref >60)
Glucose, Bld: 165 mg/dL — ABNORMAL HIGH (ref 70–99)
Potassium: 4.2 mmol/L (ref 3.5–5.1)
Sodium: 143 mmol/L (ref 135–145)
Total Bilirubin: 0.9 mg/dL (ref 0.3–1.2)
Total Protein: 6.4 g/dL — ABNORMAL LOW (ref 6.5–8.1)

## 2019-03-30 LAB — GLUCOSE, CAPILLARY
Glucose-Capillary: 211 mg/dL — ABNORMAL HIGH (ref 70–99)
Glucose-Capillary: 87 mg/dL (ref 70–99)
Glucose-Capillary: 232 mg/dL — ABNORMAL HIGH (ref 70–99)
Glucose-Capillary: 368 mg/dL — ABNORMAL HIGH (ref 70–99)
Glucose-Capillary: 321 mg/dL — ABNORMAL HIGH (ref 70–99)

## 2019-03-30 LAB — D-DIMER, QUANTITATIVE: D-Dimer, Quant: 1.6 ug/mL-FEU — ABNORMAL HIGH (ref 0.00–0.50)

## 2019-03-30 LAB — URINE CULTURE: Culture: 30000 — AB

## 2019-03-30 LAB — C-REACTIVE PROTEIN: CRP: <0.8 mg/dL (ref <1.0)

## 2019-03-30 NOTE — Progress Notes (Signed)
PROGRESS NOTE  [name]  INO:[id number] DOB: 1941/07/01 DOA: 03/22/2019 PCP: [name], MD   Brief Narrative: [name] is a 78 y.o. 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 He presented to [place] 9/19 from home where his daughter stays and is covid-positive, with a day of SOB, weakness, having fallen at home and appearing mildly confused. On evaluation he was hypoxic requiring 10L HFNC, SARS-CoV-2 PCR positive, CXR with RML, RLL infiltrates, CRP and d-dimer elevated, PCT negative. Solumedrol, vancomycin, and aztreonam with 1L IVF administered and patient was admitted to [facility], started on remdesivir.   Subjective:  Patient denies any fever, chest pain today, reports dyspnea only on exertion, still complains of cough..   Assessment & Plan: Principal Problem:   Pneumonia due to COVID-19 virus Active Problems:   Acute respiratory failure with hypoxia (HCC)   CAD (coronary artery disease)   Poorly controlled type 2 diabetes mellitus with circulatory disorder (HCC)   AKI (acute kidney injury) (HCC)   Chronic diastolic heart failure (HCC)   CKD (chronic kidney disease) stage 3, GFR 30-59 ml/min (HCC)   Sacral decubitus ulcer  Acute hypoxic respiratory failure due to [id number]-36 pneumonia:  - Certainly at high risk of decompensation/progression to MODS/ARDS.  -Significant improvement of oxygen requirement, this morning he is on 2 L nasal cannula, encourage again to use incentive spirometry and flutter valve, and to get out of bed to chair, he is unable to prone given his body habitus, but able to sleep on his side.   - received Remdesivir 9/19 - 9/23 - Continue decadron -Received Convalescent plasma 9/23 -I have discussed Actemra with the patient,and the wife and they are agreeable  if indicated, but CRP has  normalized. - CRP has normalized, but D-dimers slightly trending up, will continue to monitor closely. - Maintain euvolemia/net negative.  - Avoid NSAIDs - Recommend proning and aggressive use of incentive spirometry. - Goals of care were discussed. Prognosis is guarded.   COVID-19 Labs  Recent Labs    03/30/19 0309  DDIMER 1.60*  CRP <0.8    No results found for: SARSCOV2NAA   LFT elevation: -Most likely in the setting of COVID-19 infection, currently trending down, continue to monitor  AKI on stage III CKD: - Suspect prerenal injury, has continued improvement back to baseline.  Creatinine peaked at Cr 2.2 - Avoid hypotension and nephrotoxins.   Hypernatremia -In the setting IV diuresis, and Demadex use, resolved, is 143 today, had held his Demadex evening dose yesterday, and he was encouraged to increase his fluid intake  Thrombocytopenia: Chronic, dating back to 2017. Stable/slightly improved again today. Ok to use DVT ppx given high risk and stable/normal hgb.   Lymphopenia: Due to covid viral infection most likely.  - Continue monitoring  IDT2DM:  -CBG remains significantly elevated, I have increased his Lantus to 50 units subcu daily, continue with pre-meal insulin and insulin sliding scale .  CAD s/p CABG: No chest pain, troponin 0.08, paced rhythm without new ischemic changes on ECG.  - EDP discussed with cardiology who recommended no further work up at this time - Continue beta blocker, ASA, statin  Chronic HFpEF: Appears euvolemic - Restart home demadex 40mg  BID now that renal function stabilized and taking normal po.    Fall at home, weakness:  - Not orthostatic-sounding  symptoms, suspect weakness due to covid.  - PT/OT evaluations ongoing, wife arranging 24 hr home care.  Complete heart block : s/p PPM which appears to be functioning appropriately.   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   Unstageable coccyx pressure injury: POA -  Offload as able.  - WOC consulted, instituting Gerhart's butt cream TID.   Mild rhabdomyolysis: vs. covid-myopathy: With hx of fall at home, dipstick positive hgb w/o RBCs on micro and AKI. CK was checked, modestly elevated.  - Has been given fluids with resolution of CK elevation.  Hypokalemia -Repleted  Obesity: BMI 38.   DVT prophylaxis: Subcu Lovenox Code Status: Full Family Communication: Updated wife 9/26 Disposition Plan: Home with home health 24 to 48 hours  Consultants:   None  Procedures:   None  Antimicrobials:  Vancomycin, aztreonam 9/19  Remdesivir 9/19 - 9/23     Objective: Vitals:   03/29/19 1949 03/29/19 2030 03/30/19 0420 03/30/19 0735  BP:  114/61 129/79 124/83  Pulse:  91  72  Resp:  16  18  Temp:  (!) 97.4 F (36.3 C)  97.9 F (36.6 C)  TempSrc:  Oral Oral Oral  SpO2: 92% 93% 94% 96%  Weight:      Height:       No intake or output data in the 24 hours ending 03/30/19 1427 Filed Weights   03/22/19 2031 03/23/19 0000 03/23/19 0221  Weight: 104.2 kg 104 kg 104 kg    Awake Alert, Oriented X 3, No new F.N deficits, Normal affect Symmetrical Chest wall movement, Good air movement bilaterally, CTAB RRR,No Gallops,Rubs or new Murmurs, No Parasternal Heave +ve B.Sounds, Abd Soft, No tenderness, No rebound - guarding or rigidity. No Cyanosis, Clubbing or edema, No new Rash or bruise       Data Reviewed: I have personally reviewed following labs and imaging studies  CBC: Recent Labs  Lab 03/24/19 0345 03/25/19 0240 03/26/19 0345 03/27/19 0305 03/28/19 0348 03/29/19 0316 03/30/19 0309  WBC 5.0 5.9 6.4 6.3 8.4 7.5 8.1  NEUTROABS 4.3 5.3 5.7 5.4  --   --   --   HGB 13.3 13.2 14.4 15.5 15.3 14.6 14.7  HCT 44.5 44.1 47.9 50.9 49.7 48.3 48.9  MCV 94.9 93.8 93.0 92.4 92.6 92.9 93.5  PLT 112* 123* 161 151 166 158 381   Basic Metabolic Panel: Recent Labs  Lab 03/26/19 0345 03/27/19 0305 03/28/19 0348 03/29/19 0316 03/30/19 0309   NA 149* 150* 149* 146* 143  K 3.7 3.4* 3.1* 4.6 4.2  CL 108 103 100 103 104  CO2 32 34* 34* 32 31  GLUCOSE 138* 84 69* 116* 165*  BUN 60* 69* 66* 72* 71*  CREATININE 1.32* 1.54* 1.64* 1.60* 1.36*  CALCIUM 9.6 10.0 9.7 9.8 10.0   GFR: Estimated Creatinine Clearance: 51.3 mL/min (A) (by C-G formula based on SCr of 1.36 mg/dL (H)). Liver Function Tests: Recent Labs  Lab 03/26/19 0345 03/27/19 0305 03/28/19 0348 03/29/19 0316 03/30/19 0309  AST 66* 104* 56* 33 27  ALT 78* 147* 136* 102* 84*  ALKPHOS 95 97 92 86 89  BILITOT 0.8 1.1 1.1 1.1 0.9  PROT 7.0 7.5 6.7 6.5 6.4*  ALBUMIN 3.2* 3.4* 3.0* 3.0* 2.9*   Cardiac Enzymes: Recent Labs  Lab 03/24/19 0345 03/25/19 0240 03/26/19 0345  CKTOTAL 460* 291 263   CBG: Recent Labs  Lab 03/29/19 1106 03/29/19 1636 03/29/19 2129 03/30/19 0749 03/30/19 1145  GLUCAP 211* 321* 296* 87 232*  Urine analysis:    Component Value Date/Time   COLORURINE YELLOW 03/22/2019 2127   APPEARANCEUR CLOUDY (A) 03/22/2019 2127   LABSPEC 1.013 03/22/2019 2127   PHURINE 5.0 03/22/2019 2127   GLUCOSEU NEGATIVE 03/22/2019 2127   HGBUR MODERATE (A) 03/22/2019 2127   BILIRUBINUR NEGATIVE 03/22/2019 2127   KETONESUR NEGATIVE 03/22/2019 2127   PROTEINUR 30 (A) 03/22/2019 2127   UROBILINOGEN 0.2 07/11/2013 1345   NITRITE NEGATIVE 03/22/2019 2127   LEUKOCYTESUR MODERATE (A) 03/22/2019 2127   Recent Results (from the past 240 hour(s))  MRSA PCR Screening     Status: None   Collection Time: 03/26/19  9:01 AM   Specimen: Nasopharyngeal  Result Value Ref Range Status   MRSA by PCR NEGATIVE NEGATIVE Final    Comment:        The GeneXpert MRSA Assay (FDA approved for NASAL specimens only), is one component of a comprehensive MRSA colonization surveillance program. It is not intended to diagnose MRSA infection nor to guide or monitor treatment for MRSA infections. Performed at [facility], [place] [address] [place],  [place] [address]   Culture, Urine     Status: Abnormal   Collection Time: 03/28/19  2:00 AM   Specimen: Urine, Random  Result Value Ref Range Status   Specimen Description   Final    URINE, RANDOM Performed at [place] [address], [place], [place] [address]    Special Requests   Final    NONE Performed at [facility], [place] [address] [place], [place] [address]    Culture 30,000 COLONIES/mL ESCHERICHIA COLI (A)  Final   Report Status 03/30/2019 FINAL  Final   Organism ID, Bacteria ESCHERICHIA COLI (A)  Final      Susceptibility   Escherichia coli - MIC*    AMPICILLIN >=32 RESISTANT Resistant     CEFAZOLIN >=64 RESISTANT Resistant     CEFTRIAXONE 16 INTERMEDIATE Intermediate     CIPROFLOXACIN <=0.25 SENSITIVE Sensitive     GENTAMICIN <=1 SENSITIVE Sensitive     IMIPENEM <=0.25 SENSITIVE Sensitive     NITROFURANTOIN <=16 SENSITIVE Sensitive     TRIMETH/SULFA <=20 SENSITIVE Sensitive     AMPICILLIN/SULBACTAM >=32 RESISTANT Resistant     PIP/TAZO >=128 RESISTANT Resistant     Extended ESBL NEGATIVE Sensitive     * 30,000 COLONIES/mL ESCHERICHIA COLI      Radiology Studies: No results found.  Scheduled Meds: . sodium chloride   Intravenous Once  . allopurinol  300 mg Oral Daily  . aspirin EC  81 mg Oral Daily  . atorvastatin  80 mg Oral QAC breakfast  . bisoprolol  2.5 mg Oral Daily  . calcitRIOL  0.25 mcg Oral Daily  . dexamethasone  6 mg Oral Q24H  . enoxaparin (LOVENOX) injection  50 mg Subcutaneous Q24H  . Gerhardt's butt cream   Topical TID  . insulin aspart  0-20 Units Subcutaneous TID WC  . insulin aspart  0-5 Units Subcutaneous QHS  . insulin aspart  16 Units Subcutaneous TID WC  . insulin glargine  75 Units Subcutaneous Daily  . Ipratropium-Albuterol  1 puff Inhalation Q6H  . pantoprazole  40 mg Oral QHS  . torsemide  40 mg Oral BID   Continuous Infusions:    LOS: 8 days    Phillips Climes, MD [facility]  [email or web address] Password [facility] 03/30/2019, 2:27 PM

## 2019-03-31 ENCOUNTER — Ambulatory Visit: Payer: Medicare Other | Admitting: Internal Medicine

## 2019-03-31 LAB — COMPREHENSIVE METABOLIC PANEL
ALT: 103 U/L — ABNORMAL HIGH (ref 0–44)
AST: 38 U/L (ref 15–41)
Albumin: 3.5 g/dL (ref 3.5–5.0)
Alkaline Phosphatase: 101 U/L (ref 38–126)
Anion gap: 17 — ABNORMAL HIGH (ref 5–15)
BUN: 79 mg/dL — ABNORMAL HIGH (ref 8–23)
CO2: 29 mmol/L (ref 22–32)
Calcium: 10.7 mg/dL — ABNORMAL HIGH (ref 8.9–10.3)
Chloride: 98 mmol/L (ref 98–111)
Creatinine, Ser: 1.6 mg/dL — ABNORMAL HIGH (ref 0.61–1.24)
GFR calc Af Amer: 48 mL/min — ABNORMAL LOW (ref >60)
GFR calc non Af Amer: 41 mL/min — ABNORMAL LOW (ref >60)
Glucose, Bld: 89 mg/dL (ref 70–99)
Potassium: 4.3 mmol/L (ref 3.5–5.1)
Sodium: 144 mmol/L (ref 135–145)
Total Bilirubin: 1.1 mg/dL (ref 0.3–1.2)
Total Protein: 7.3 g/dL (ref 6.5–8.1)

## 2019-03-31 LAB — CBC
HCT: 55.8 % — ABNORMAL HIGH (ref 39.0–52.0)
Hemoglobin: 17 g/dL (ref 13.0–17.0)
MCH: 28.7 pg (ref 26.0–34.0)
MCHC: 30.5 g/dL (ref 30.0–36.0)
MCV: 94.1 fL (ref 80.0–100.0)
Platelets: 178 10*3/uL (ref 150–400)
RBC: 5.93 MIL/uL — ABNORMAL HIGH (ref 4.22–5.81)
RDW: 18.2 % — ABNORMAL HIGH (ref 11.5–15.5)
WBC: 10.3 10*3/uL (ref 4.0–10.5)
nRBC: 0 % (ref 0.0–0.2)

## 2019-03-31 LAB — GLUCOSE, CAPILLARY
Glucose-Capillary: 280 mg/dL — ABNORMAL HIGH (ref 70–99)
Glucose-Capillary: 94 mg/dL (ref 70–99)
Glucose-Capillary: 224 mg/dL — ABNORMAL HIGH (ref 70–99)
Glucose-Capillary: 290 mg/dL — ABNORMAL HIGH (ref 70–99)
Glucose-Capillary: 172 mg/dL — ABNORMAL HIGH (ref 70–99)

## 2019-03-31 MED ORDER — TORSEMIDE 20 MG PO TABS
40.0000 mg | ORAL_TABLET | Freq: Every day | ORAL | Status: DC
  Administered 2019-04-01: 40 mg via ORAL
  Filled 2019-03-31 (×2): qty 2

## 2019-03-31 NOTE — Progress Notes (Signed)
PROGRESS NOTE  KYSHAWN TEAL  IZT:[id number] DOB: 1940/10/26 DOA: 03/22/2019 PCP: [name], MD   Brief Narrative: [name] is a 78 y.o. 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 He presented to [place] 9/19 from home where his daughter stays and is covid-positive, with a day of SOB, weakness, having fallen at home and appearing mildly confused. On evaluation he was hypoxic requiring 10L HFNC, SARS-CoV-2 PCR positive, CXR with RML, RLL infiltrates, CRP and d-dimer elevated, PCT negative. Solumedrol, vancomycin, and aztreonam with 1L IVF administered and patient was admitted to [facility], started on remdesivir.   Subjective:  Patient reports his dyspnea at rest has resolved, remains minimal at exertion, denies any chest pain, fever or chills.   Assessment & Plan: Principal Problem:   Pneumonia due to COVID-19 virus Active Problems:   Acute respiratory failure with hypoxia (HCC)   CAD (coronary artery disease)   Poorly controlled type 2 diabetes mellitus with circulatory disorder (HCC)   AKI (acute kidney injury) (HCC)   Chronic diastolic heart failure (HCC)   CKD (chronic kidney disease) stage 3, GFR 30-59 ml/min (HCC)   Sacral decubitus ulcer  Acute hypoxic respiratory failure due to JASNK-53 pneumonia:  - Certainly at high risk of decompensation/progression to MODS/ARDS.  -Significant improvement of oxygen requirement, this morning he is on 2 L nasal cannula, encourage again to use incentive spirometry and flutter valve, and to get out of bed to chair, he is unable to prone given his body habitus, but able to sleep on his side.   - received Remdesivir 9/19 - 9/23 -IV Decadron -Received Convalescent plasma 9/23 -I have discussed Actemra with the patient,and the wife and they are agreeable  if indicated, but CRP  has normalized. - CRP has normalized, but D-dimers slightly trending up, will continue to monitor closely. - Maintain euvolemia/net negative.  - Avoid NSAIDs - Recommend proning and aggressive use of incentive spirometry. - Goals of care were discussed. Prognosis is guarded.   COVID-19 Labs  Recent Labs    03/30/19 0309  DDIMER 1.60*  CRP <0.8    No results found for: SARSCOV2NAA   LFT elevation: -Most likely in the setting of COVID-19 infection, getting stable.  AKI on stage III CKD: - Suspect prerenal injury, creatinine peaked at 2.2, but it is improving currently, will hold evening dose Demadex as creatinine up to 1.6 today. - Avoid hypotension and nephrotoxins.   Hypernatremia -resolved.  We will hold his Demadex this afternoon to avoid recurrent hypernatremia.  Thrombocytopenia:  -Chronic, dating back to 2017. Stable/slightly improved again today. Ok to use DVT ppx given high risk and stable/normal hgb.   Lymphopenia: Due to covid viral infection most likely.  - Continue monitoring  IDT2DM:  -Labile, overall controlled, but has low reading here and there, will correct some hyperglycemia to avoid recurrent low blood sugar .  CAD s/p CABG: No chest pain, troponin 0.08, paced rhythm without new ischemic changes on ECG.  - EDP discussed with cardiology who recommended no further work up at this time - Continue beta blocker, ASA, statin  Chronic HFpEF: Appears euvolemic - Restart home demadex 40mg  BID now that renal function stabilized and taking normal po.    Fall at home, weakness:  - Not orthostatic-sounding symptoms, suspect weakness due to covid.  - PT/OT evaluations  ongoing, wife arranging 24 hr home care.  Complete heart block : s/p PPM which appears to be functioning appropriately.   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   Unstageable coccyx pressure injury: POA - Offload as able.  - WOC consulted, instituting Gerhart's butt cream  TID.   Mild rhabdomyolysis: vs. covid-myopathy: With hx of fall at home, dipstick positive hgb w/o RBCs on micro and AKI. CK was checked, modestly elevated.  - Has been given fluids with resolution of CK elevation.  Hypokalemia -Repleted  Obesity: BMI 38.   DVT prophylaxis: Subcu Lovenox Code Status: Full Family Communication: will update wife today Disposition Plan: Home tomorrow.  Consultants:   None  Procedures:   None  Antimicrobials:  Vancomycin, aztreonam 9/19  Remdesivir 9/19 - 9/23     Objective: Vitals:   03/30/19 2031 03/31/19 0000 03/31/19 0446 03/31/19 0700  BP: (!) 140/91 126/84 97/68 111/74  Pulse: 94 92 78 80  Resp: 20 20 20    Temp: (!) 97.5 F (36.4 C) 97.6 F (36.4 C) 97.9 F (36.6 C) 97.6 F (36.4 C)  TempSrc: Oral   Oral  SpO2: 93% 93% 92% 97%  Weight:      Height:        Intake/Output Summary (Last 24 hours) at 03/31/2019 1620 Last data filed at 03/31/2019 0727 Gross per 24 hour  Intake -  Output 1600 ml  Net -1600 ml   Filed Weights   03/22/19 2031 03/23/19 0000 03/23/19 0221  Weight: 104.2 kg 104 kg 104 kg    Awake Alert, Oriented X 3, No new F.N deficits, Normal affect Symmetrical Chest wall movement, Good air movement bilaterally, CTAB RRR,No Gallops,Rubs or new Murmurs, No Parasternal Heave +ve B.Sounds, Abd Soft, No tenderness, No rebound - guarding or rigidity. No Cyanosis, Clubbing or edema, No new Rash or bruise        Data Reviewed: I have personally reviewed following labs and imaging studies  CBC: Recent Labs  Lab 03/25/19 0240 03/26/19 0345 03/27/19 0305 03/28/19 0348 03/29/19 0316 03/30/19 0309 03/31/19 0302  WBC 5.9 6.4 6.3 8.4 7.5 8.1 10.3  NEUTROABS 5.3 5.7 5.4  --   --   --   --   HGB 13.2 14.4 15.5 15.3 14.6 14.7 17.0  HCT 44.1 47.9 50.9 49.7 48.3 48.9 55.8*  MCV 93.8 93.0 92.4 92.6 92.9 93.5 94.1  PLT 123* 161 151 166 158 152 295   Basic Metabolic Panel: Recent Labs  Lab 03/27/19 0305  03/28/19 0348 03/29/19 0316 03/30/19 0309 03/31/19 0302  NA 150* 149* 146* 143 144  K 3.4* 3.1* 4.6 4.2 4.3  CL 103 100 103 104 98  CO2 34* 34* 32 31 29  GLUCOSE 84 69* 116* 165* 89  BUN 69* 66* 72* 71* 79*  CREATININE 1.54* 1.64* 1.60* 1.36* 1.60*  CALCIUM 10.0 9.7 9.8 10.0 10.7*   GFR: Estimated Creatinine Clearance: 43.6 mL/min (A) (by C-G formula based on SCr of 1.6 mg/dL (H)). Liver Function Tests: Recent Labs  Lab 03/27/19 0305 03/28/19 0348 03/29/19 0316 03/30/19 0309 03/31/19 0302  AST 104* 56* 33 27 38  ALT 147* 136* 102* 84* 103*  ALKPHOS 97 92 86 89 101  BILITOT 1.1 1.1 1.1 0.9 1.1  PROT 7.5 6.7 6.5 6.4* 7.3  ALBUMIN 3.4* 3.0* 3.0* 2.9* 3.5   Cardiac Enzymes: Recent Labs  Lab 03/25/19 0240 03/26/19 0345  CKTOTAL 291 263   CBG: Recent Labs  Lab 03/30/19 1145 03/30/19 1709 03/30/19  2147 03/31/19 0742 03/31/19 1150  GLUCAP 232* 368* 321* 94 224*   Urine analysis:    Component Value Date/Time   COLORURINE YELLOW 03/22/2019 2127   APPEARANCEUR CLOUDY (A) 03/22/2019 2127   LABSPEC 1.013 03/22/2019 2127   PHURINE 5.0 03/22/2019 2127   GLUCOSEU NEGATIVE 03/22/2019 2127   HGBUR MODERATE (A) 03/22/2019 2127   BILIRUBINUR NEGATIVE 03/22/2019 2127   [place] 03/22/2019 2127   PROTEINUR 30 (A) 03/22/2019 2127   UROBILINOGEN 0.2 07/11/2013 1345   NITRITE NEGATIVE 03/22/2019 2127   LEUKOCYTESUR MODERATE (A) 03/22/2019 2127   Recent Results (from the past 240 hour(s))  MRSA PCR Screening     Status: None   Collection Time: 03/26/19  9:01 AM   Specimen: Nasopharyngeal  Result Value Ref Range Status   MRSA by PCR NEGATIVE NEGATIVE Final    Comment:        The GeneXpert MRSA Assay (FDA approved for NASAL specimens only), is one component of a comprehensive MRSA colonization surveillance program. It is not intended to diagnose MRSA infection nor to guide or monitor treatment for MRSA infections. Performed at [facility], [place], [place] [address], [place], [place] [address]   Culture, Urine     Status: Abnormal   Collection Time: 03/28/19  2:00 AM   Specimen: Urine, Random  Result Value Ref Range Status   Specimen Description   Final    URINE, RANDOM Performed at [place] [address], [place], [place] [address]    Special Requests   Final    NONE Performed at [facility], [place] [address] [place], [place] [address]    Culture 30,000 COLONIES/mL ESCHERICHIA COLI (A)  Final   Report Status 03/30/2019 FINAL  Final   Organism ID, Bacteria ESCHERICHIA COLI (A)  Final      Susceptibility   Escherichia coli - MIC*    AMPICILLIN >=32 RESISTANT Resistant     CEFAZOLIN >=64 RESISTANT Resistant     CEFTRIAXONE 16 INTERMEDIATE Intermediate     CIPROFLOXACIN <=0.25 SENSITIVE Sensitive     GENTAMICIN <=1 SENSITIVE Sensitive     IMIPENEM <=0.25 SENSITIVE Sensitive     NITROFURANTOIN <=16 SENSITIVE Sensitive     TRIMETH/SULFA <=20 SENSITIVE Sensitive     AMPICILLIN/SULBACTAM >=32 RESISTANT Resistant     PIP/TAZO >=128 RESISTANT Resistant     Extended ESBL NEGATIVE Sensitive     * 30,000 COLONIES/mL ESCHERICHIA COLI      Radiology Studies: No results found.  Scheduled Meds: . sodium chloride   Intravenous Once  . allopurinol  300 mg Oral Daily  . aspirin EC  81 mg Oral Daily  . atorvastatin  80 mg Oral QAC breakfast  . bisoprolol  2.5 mg Oral Daily  . calcitRIOL  0.25 mcg Oral Daily  . dexamethasone  6 mg Oral Q24H  . enoxaparin (LOVENOX) injection  50 mg Subcutaneous Q24H  . Gerhardt's butt cream   Topical TID  . insulin aspart  0-20 Units Subcutaneous TID WC  . insulin aspart  0-5 Units Subcutaneous QHS  . insulin aspart  16 Units Subcutaneous TID WC  . insulin glargine  75 Units Subcutaneous Daily  . Ipratropium-Albuterol  1 puff Inhalation Q6H  . pantoprazole  40 mg Oral QHS  . torsemide  40 mg Oral BID   Continuous Infusions:    LOS: 9 days     Phillips Climes, MD [facility] [email or web address] Password [email or web address] 03/31/2019, 4:20 PM

## 2019-03-31 NOTE — Progress Notes (Signed)
Inpatient Diabetes Program Recommendations  AACE/ADA: New Consensus Statement on Inpatient Glycemic Control (2015)  Target Ranges:  Prepandial:   less than 140 mg/dL      Peak postprandial:   less than 180 mg/dL (1-2 hours)      Critically ill patients:  140 - 180 mg/dL   Lab Results  Component Value Date   GLUCAP 94 03/31/2019   HGBA1C 7.1 (A) 11/18/2018    Review of Glycemic Control Results for [name], [name] (MRN [id number]) as of 03/31/2019 11:22  Ref. Range 03/30/2019 11:45 03/30/2019 17:09 03/30/2019 21:47  Glucose-Capillary Latest Ref Range: 70 - 99 mg/dL 232 (H) 368 (H) 321 (H)    Admit with: COVID 19  History: DM, CHF, CKD, COPD  Home DM Meds: Tresiba 30 units Daily                             Ozempic 1 mg QSunday  Current orders for Inpatient glycemic control: Novolog 0-20 units TID, Novolog 0-5 units QHS, Novolog 16 units TID, Lantus 75 units QD Decadron 6 mg QD  Inpatient Diabetes Program Recommendations:    Consider further increasing Novolog 20 units TID (assuming patient is consuming >50% of meals in the setting of steroids).   Thanks, Bronson Curb, MSN, RNC-OB Diabetes Coordinator [phone] (8a-5p)

## 2019-03-31 NOTE — Progress Notes (Signed)
Physical Therapy Treatment Patient Details Name: ZAYIN VALADEZ MRN: [id number] DOB: 08/13/1940 Today's Date: 03/31/2019    History of Present Illness 78 y.o. 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 Patient lives with daughter who has [place]. Pt tested COVID +; Had mild confusion at time of presentation.  Also had fallen earlier in day due to generalized weakness. Mild rhabdomyolysis: vs. covid-myopathy. ?pressure area left gluteal cleft    PT Comments    Patient's cognition improved (able to mostly correctly demonstrate use of IS and flutter valve), however he believes he is in the hospital because is volunteering to help with our "blood products." He was able to ambulate in hallway today with 6L O2 with sats 95-100%, however RR elevates to high 30s and requires cues to stop talking and stop to slow breathing. Did well with standing balance activities, however with step taps to 8" surface he did require min assist to maintain balance.      Follow Up Recommendations  Home health PT;Supervision/Assistance - 24 hour(24/7 due to decr cognition and impulsive)     Equipment Recommendations  None recommended by PT    Recommendations for Other Services       Precautions / Restrictions Precautions Precautions: Fall;Other (comment) Precaution Comments: decr cognition Restrictions Weight Bearing Restrictions: No    Mobility  Bed Mobility Overal bed mobility: Needs Assistance Bed Mobility: Sidelying to Sit   Sidelying to sit: Min assist       General bed mobility comments: HOB 0, no rail; pt unable to push himself up and reaching for assist from PT; allowed him to pull on PT's hand  Transfers Overall transfer level: Needs assistance Equipment used: Rolling walker (2 wheeled) Transfers: Sit  to/from Stand Sit to Stand: Min guard         General transfer comment: vcs for safety and assist w/ line management; impulsive  Ambulation/Gait Ambulation/Gait assistance: Min guard Gait Distance (Feet): 150 Feet Assistive device: Rolling walker (2 wheeled) Gait Pattern/deviations: Step-through pattern;Trunk flexed;Wide base of support;Decreased stride length Gait velocity: slow   General Gait Details: vc for pt to slow down due to RR upper 30s; also cues to not talk while walking   Stairs             Wheelchair Mobility    Modified Rankin (Stroke Patients Only)       Balance Overall balance assessment: Needs assistance Sitting-balance support: Feet supported;No upper extremity supported Sitting balance-Leahy Scale: Fair     Standing balance support: No upper extremity supported Standing balance-Leahy Scale: Good Standing balance comment: able to reach forward 6", reach to pick item off the floor; step-taps x 8 using trash can on it's side with min assist for imbalance                             Cognition Arousal/Alertness: Awake/alert Behavior During Therapy: Impulsive Overall Cognitive Status: Impaired/Different from baseline Area of Impairment: Orientation;Attention;Memory;Following commands;Safety/judgement;Awareness;Problem solving                 Orientation Level: Person;Place;Time(month and year; no clue why in hospital) Current Attention Level: Selective Memory: Decreased short-term memory Following Commands: Follows one step commands consistently Safety/Judgement: Decreased awareness of safety;Decreased awareness of deficits Awareness: Emergent Problem Solving: Requires  verbal cues General Comments: remains impulsive, but less so; thought he was in the hospital as a volunteer doing something with blood products      Exercises Other Exercises Other Exercises: pt able to demonstrate both flutter valve and IS, however he both  inhales and exhales into both devices    General Comments        Pertinent Vitals/Pain Pain Assessment: No/denies pain Faces Pain Scale: No hurt    Home Living                      Prior Function            PT Goals (current goals can now be found in the care plan section) Acute Rehab PT Goals Patient Stated Goal: to get stronger Time For Goal Achievement: 04/07/19 Potential to Achieve Goals: Good Progress towards PT goals: Progressing toward goals    Frequency    Min 3X/week      PT Plan Current plan remains appropriate    Co-evaluation              AM-PAC PT "6 Clicks" Mobility   Outcome Measure  Help needed turning from your back to your side while in a flat bed without using bedrails?: None Help needed moving from lying on your back to sitting on the side of a flat bed without using bedrails?: A Little Help needed moving to and from a bed to a chair (including a wheelchair)?: A Little Help needed standing up from a chair using your arms (e.g., wheelchair or bedside chair)?: A Little Help needed to walk in hospital room?: A Little Help needed climbing 3-5 steps with a railing? : A Lot 6 Click Score: 18    End of Session Equipment Utilized During Treatment: Oxygen Activity Tolerance: Patient tolerated treatment well Patient left: in chair;with call bell/phone within reach;with chair alarm set Nurse Communication: Mobility status PT Visit Diagnosis: Difficulty in walking, not elsewhere classified (R26.2);Unsteadiness on feet (R26.81)     Time: 1122-1204 PT Time Calculation (min) (ACUTE ONLY): 42 min  Charges:  $Gait Training: 23-37 mins $Therapeutic Activity: 8-22 mins                       Barry Brunner, PT       [place] P Edgar Reisz 03/31/2019, 2:29 PM

## 2019-03-31 NOTE — Progress Notes (Signed)
Spoke with [name] his wife. No questions at this time.

## 2019-04-01 LAB — GLUCOSE, CAPILLARY
Glucose-Capillary: 77 mg/dL (ref 70–99)
Glucose-Capillary: 253 mg/dL — ABNORMAL HIGH (ref 70–99)

## 2019-04-01 LAB — COMPREHENSIVE METABOLIC PANEL
ALT: 103 U/L — ABNORMAL HIGH (ref 0–44)
AST: 42 U/L — ABNORMAL HIGH (ref 15–41)
Albumin: 3 g/dL — ABNORMAL LOW (ref 3.5–5.0)
Alkaline Phosphatase: 79 U/L (ref 38–126)
Anion gap: 13 (ref 5–15)
BUN: 82 mg/dL — ABNORMAL HIGH (ref 8–23)
CO2: 30 mmol/L (ref 22–32)
Calcium: 10 mg/dL (ref 8.9–10.3)
Chloride: 98 mmol/L (ref 98–111)
Creatinine, Ser: 1.62 mg/dL — ABNORMAL HIGH (ref 0.61–1.24)
GFR calc Af Amer: 47 mL/min — ABNORMAL LOW (ref >60)
GFR calc non Af Amer: 41 mL/min — ABNORMAL LOW (ref >60)
Glucose, Bld: 51 mg/dL — ABNORMAL LOW (ref 70–99)
Potassium: 4 mmol/L (ref 3.5–5.1)
Sodium: 141 mmol/L (ref 135–145)
Total Bilirubin: 1.1 mg/dL (ref 0.3–1.2)
Total Protein: 6.4 g/dL — ABNORMAL LOW (ref 6.5–8.1)

## 2019-04-01 LAB — CBC
HCT: 51.1 % (ref 39.0–52.0)
Hemoglobin: 15.5 g/dL (ref 13.0–17.0)
MCH: 28.2 pg (ref 26.0–34.0)
MCHC: 30.3 g/dL (ref 30.0–36.0)
MCV: 93.1 fL (ref 80.0–100.0)
Platelets: 176 10*3/uL (ref 150–400)
RBC: 5.49 MIL/uL (ref 4.22–5.81)
RDW: 17.5 % — ABNORMAL HIGH (ref 11.5–15.5)
WBC: 10.9 10*3/uL — ABNORMAL HIGH (ref 4.0–10.5)
nRBC: 0 % (ref 0.0–0.2)

## 2019-04-01 MED ORDER — BACITRACIN ZINC 500 UNIT/GM EX OINT
TOPICAL_OINTMENT | Freq: Two times a day (BID) | CUTANEOUS | Status: DC
  Filled 2019-04-01: qty 28.35

## 2019-04-01 MED ORDER — BACITRACIN ZINC 500 UNIT/GM EX OINT: TOPICAL_OINTMENT | Freq: Two times a day (BID) | CUTANEOUS | 0 refills | Status: DC

## 2019-04-01 MED ORDER — TRESIBA FLEXTOUCH 200 UNIT/ML ~~LOC~~ SOPN: 45.0000 [IU] | PEN_INJECTOR | Freq: Every day | SUBCUTANEOUS | 5 refills | Status: DC

## 2019-04-01 MED ORDER — TORSEMIDE 20 MG PO TABS: ORAL_TABLET | ORAL | 0 refills | Status: DC

## 2019-04-01 NOTE — Care Management Important Message (Signed)
Important Message  Patient Details  Name: [name] MRN: [id number] Date of Birth: 02-10-1941   Medicare Important Message Given:  Yes - Important Message mailed due to current National Emergency  Verbal consent obtained due to current National Emergency  Relationship to patient: Spouse/Significant Other Contact Name: Ms Crocket Call Date: 04/01/19  Time: 1439 Phone: [phone] Outcome: Spoke with contact Important Message mailed to: Patient address on file    Delorse Lek 04/01/2019, 3:40 PM

## 2019-04-01 NOTE — TOC Transition Note (Signed)
Transition of Care [facility]) - CM/SW Discharge Note   Patient Details  Name: [name] MRN: [id number] Date of Birth: [birth year]  Transition [facility] Care [facility]) CM/SW Contact:  Ninfa Meeker, RN Phone Number: [phone] (working remotely) 04/01/2019, 10:47 AM   Clinical Narrative:  Patient will likely discharge home today. Home Health was previously arranged. Case manager has arranged for oxygen for home today. Referral called to [facility], [employer] Case Manager confirmed with wife [name] their daughter's address: [address] [place], [place]. Provided [name] with this address. Case Manager contacted Shearon Balo at [facility], [facility], [facility] requesting a portable tank be delivered to patient's room.              Final next level of care: [place] Barriers to Discharge: Continued Medical Work up   Patient Goals and CMS Choice Patient states their goals for this hospitalization and ongoing recovery are:: per wife for him to recover CMS Medicare.gov Compare Post Acute Care list provided to:: Patient Represenative (must comment)(wife: [name]) Choice offered to / list presented to : Spouse  Discharge Placement                       Discharge Plan and Services   Discharge Planning Services: CM Consult Post Acute Care Choice: Home Health          DME Arranged: N/A DME Agency: NA       HH Arranged: PT, OT [place] Agency: [place] Date [place]: 03/29/19 Time [place]: 0945 Representative spoke with at [place]: Adela Lank  Social Determinants of Health (SDOH) Interventions     Readmission Risk Interventions No flowsheet data found.

## 2019-04-01 NOTE — Progress Notes (Signed)
Occupational Therapy Treatment Patient Details Name: [name] MRN: [id number] DOB: Feb 28, 1941 Today's Date: 04/01/2019    History of present illness 78 y.o. 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 Patient lives with daughter who has [place]. Pt tested COVID +; Had mild confusion at time of presentation.  Also had fallen earlier in day due to generalized weakness. Mild rhabdomyolysis: vs. covid-myopathy. ?pressure area left gluteal cleft   OT comments  Pt making progress toward goals. Pt with apparent cognitive impairments as noted below. Discussed plan of care with wife over the phone and pt's cognitive status PTA. Wife states pt would have "moments of confusion". Recommend pt follow up with his PCP after DC to further address cognition - wife in agreement. Due to weakness and cognitive deficits, educated wife on need for 24/7 S and assistance for all mobility. Wife verbalized understanding. Pt will need a 3in1 and RW for safe DC home. Desat to 87 on RA during activity/mobility. Able to maintain SpO2 above 93 on 2L. Contacted CM regarding DC needs. Will continue to follow acutely.   Follow Up Recommendations  Home health OT;Supervision/Assistance - 24 hour;Other (comment)(HH Aide)    Equipment Recommendations  3 in 1 bedside commode;Other (comment)(RW)    Recommendations for Other Services      Precautions / Restrictions Precautions Precautions: Fall;Other (comment) Precaution Comments: decr cognition Restrictions Weight Bearing Restrictions: No       Mobility Bed Mobility               General bed mobility comments: OOB in chair  Transfers Overall transfer level: Needs assistance Equipment used: Rolling walker (2 wheeled) Transfers: Sit to/from Stand Sit to Stand: Min guard          General transfer comment: vcs for safety; unsafe descent to chair    Balance Overall balance assessment: Needs assistance Sitting-balance support: Feet supported;No upper extremity supported Sitting balance-Leahy Scale: Fair     Standing balance support: No upper extremity supported Standing balance-Leahy Scale: Good                             ADL either performed or assessed with clinical judgement   ADL Overall ADL's : Needs assistance/impaired     Grooming: Standing;Min guard   Upper Body Bathing: Supervision/ safety;Standing   Lower Body Bathing: Minimal assistance;Sit to/from stand   Upper Body Dressing : Set up;Sitting   Lower Body Dressing: Minimal assistance;Sit to/from stand   Toilet Transfer: Minimal assistance;RW;Ambulation   Toileting- Water quality scientist and Hygiene: Min guard;Sit to/from stand       Functional mobility during ADLs: Minimal assistance;Rolling walker;Cueing for safety;Cueing for sequencing General ADL Comments: Pt unsafe during during ADL; multiple cues for safety; repetitive cues to redirect pt to task; attempted to walk away form BSC after having BM without wiping himself     Vision       Perception     Praxis      Cognition Arousal/Alertness: Awake/alert Behavior During Therapy: Impulsive Overall Cognitive Status: Impaired/Different from baseline Area of Impairment: Orientation;Attention;Memory;Following commands;Safety/judgement;Awareness;Problem solving                 Orientation Level: Time;Place Current Attention Level: Selective Memory: Decreased short-term memory Following Commands: Follows one step commands consistently  Safety/Judgement: Decreased awareness of safety;Decreased awareness of deficits Awareness: Emergent Problem Solving: Requires verbal cues General Comments: does not know name of hopsital; thinks it is December 25, 2022; trying to use dead cell phone to call his wife regardless of  multiple cues that his phone is dead; difficulty with problem solving; requires muliple redirectional cues        Exercises     Shoulder Instructions       General Comments      Pertinent Vitals/ Pain       Pain Assessment: No/denies pain Faces Pain Scale: No hurt  Home Living                                          Prior Functioning/Environment              Frequency  Min 3X/week        Progress Toward Goals  OT Goals(current goals can now be found in the care plan section)  Progress towards OT goals: Progressing toward goals  Acute Rehab OT Goals Patient Stated Goal: to get stronger OT Goal Formulation: With patient Time For Goal Achievement: 04/07/19 Potential to Achieve Goals: Good ADL Goals Pt Will Perform Grooming: with supervision;standing Pt Will Perform Lower Body Bathing: with supervision;sit to/from stand;sitting/lateral leans Pt Will Perform Lower Body Dressing: with supervision;sitting/lateral leans;sit to/from stand Pt Will Transfer to Toilet: with supervision;regular height toilet;ambulating Pt Will Perform Tub/Shower Transfer: with supervision;shower seat;ambulating;rolling walker Additional ADL Goal #1: Pt will recall and/or apply 1-3 ECS strategies while engaging in BADL activity for successful completion Additional ADL Goal #2: Pt will demonstrate anticipatory awareness in moderately distracting environment for safe IADL completion  Plan Discharge plan remains appropriate    Co-evaluation                 AM-PAC OT "6 Clicks" Daily Activity     Outcome Measure   Help from another person eating meals?: None Help from another person taking care of personal grooming?: A Little Help from another person toileting, which includes using toliet, bedpan, or urinal?: A Little Help from another person bathing (including washing, rinsing, drying)?: A Little Help from another person to put on and taking off regular upper  body clothing?: A Little Help from another person to put on and taking off regular lower body clothing?: A Little 6 Click Score: 19    End of Session Equipment Utilized During Treatment: Oxygen;Gait belt  OT Visit Diagnosis: Other abnormalities of gait and mobility (R26.89);Muscle weakness (generalized) (M62.81)   Activity Tolerance Patient tolerated treatment well   Patient Left in chair;with call bell/phone within reach;with chair alarm set   Nurse Communication Mobility status;Other (comment)(DC needs)        Time: 0950-1030 OT Time Calculation (min): 40 min  Charges: OT General Charges $OT Visit: 1 Visit OT Treatments $Self Care/Home Management : 38-52 mins  Maurie Boettcher, OT/L   Acute OT Clinical Specialist [place] Pager [phone] Office [phone]    [facility] 04/01/2019, 10:53 AM

## 2019-04-01 NOTE — Progress Notes (Signed)
Pt continues to urinate on floor and in bed. Pt given bath at this time and linens changed. Upon cleaning, unstageable pressure sore noted on penis. Wound consult placed and MD made aware.

## 2019-04-01 NOTE — Discharge Summary (Signed)
[name], is a 78 y.o. male  DOB 04/14/1941  MRN [id number].  Admission date:  03/22/2019  Admitting Physician  Bonnielee Haff, MD  Discharge Date:  04/01/2019   Primary MD  [name], MD  Recommendations for primary care physician for things to follow:  -Check CBC, CMP during next visit -Patient instructed to take torsemide once a day for 1 week, then twice daily, adjust dose and if needed. -Patient to follow with urology as an outpatient in 10 days if his also did not resolve, to see if further work-up is indicated(to see if biopsy is indicated)  Admission Diagnosis  covid 19    Discharge Diagnosis  covid 19    Principal Problem:   Pneumonia due to COVID-19 virus Active Problems:   Acute respiratory failure with hypoxia (HCC)   CAD (coronary artery disease)   Poorly controlled type 2 diabetes mellitus with circulatory disorder (HCC)   AKI (acute kidney injury) ([place])   Chronic diastolic heart failure (HCC)   CKD (chronic kidney disease) stage 3, GFR 30-59 ml/min (HCC)   Sacral decubitus ulcer      Past Medical History:  Diagnosis Date   AAA (abdominal aortic aneurysm) ([place])    5/14 CT showed > 6 cm AAA, also right iliac aneurysm. Not stent graft candidate.    Anemia of chronic disease    Anxiety    pt. admits that he has anxiety at times    Aortic stenosis    Moderate to severe by echo in 4/14. Bioprosthetic #21 [facility] Ease aortic valve replacement in 4/14.    Arthritis    CAD (coronary artery disease)    a. 4/14 CABG: LIMA-LAD, seq SVG-ramus & OM1, SVG-PDA   Carotid arterial disease (HCC)    Carotid dopplers (2/87) with 86-76% LICA stenosis.    CHF (congestive heart failure) ([place]) 07/2012; 10/17/2012   Chronic kidney disease    increased creatinine recently - being followed, near kidney failure fr. contrast dye    Chronic systolic heart failure ([place])    a.  1/14 ECHO: sev dil LV, EF30-35%, diff HK, mild MR, AS severe, AV grad 35 b. 8/14 ECHO: EF 55-60%, mild biopros AV sten mn grad. 25, RV mild dil, RA mild dil   Complication of anesthesia    during last surgery had to be given special medicine b/c "something dropped" during surgery    COPD (chronic obstructive pulmonary disease) ([place])    a.  History of heavy smoking. PFTs (4/14) with mixed obstructive (COPD) and restrictive (post-ARDS) picture.    Diabetes mellitus without complication ([place])    Esophageal reflux    History of blood transfusion    "lots since January" (10/17/2012)   Hyperlipidemia    Hypertension    Ischemic cardiomyopathy    Ischemic cardiomyopathy    a. 4/14 LHC: pLAD 95, ost ramus 70-80, mLCx 90, EF 30%   OSA (obstructive sleep apnea)    on cpap- every sleep time.    Pneumonia 07/2012   PNA (  H1N1 influenza + pneumococcus) in 0/80 complicated by respiratory failure/ARDS. Required tracheostomy, now weaned off. Had left empyema requiring chest tube.    Prediabetes    Prostate cancer [facility])     s/p radiation treatment. - (PT. DENIES)Has indwelling foley.    Shortness of breath    still goes deer hunting by himself    Past Surgical History:  Procedure Laterality Date   ABDOMINAL AORTIC ANEURYSM REPAIR N/A 07/10/2013   Procedure: Resection and Graftiong of perirenal AAA; Insertion 14 x 8 Hemashield Graft Aorta to Left Common Iliac and to Right Common Femoral Artery With Ligastion of Right External and Interanl Iliac Artery;  Surgeon: Mal Misty, MD;  Location: [place];  Service: Vascular;  Laterality: N/A;   ANAL FISSURE REPAIR  2008   AORTIC VALVE REPLACEMENT N/A 10/25/2012   Procedure: AORTIC VALVE REPLACEMENT (AVR);  Surgeon: Melrose Nakayama, MD;  Location: [place];  Service: Open Heart Surgery;  Laterality: N/A;   CARDIAC VALVE REPLACEMENT     CORONARY ARTERY BYPASS GRAFT N/A 10/25/2012   Procedure: CORONARY ARTERY BYPASS GRAFTING (CABG);  Surgeon:  Melrose Nakayama, MD;  Location: [place];  Service: Open Heart Surgery;  Laterality: N/A;  times 4 using left internal mammary artery and endoscopically harvested bilateral saphenous vein    EP IMPLANTABLE DEVICE N/A 09/22/2015   Procedure: BiV Upgrade;  Surgeon: Deboraha Sprang, MD;  Location: [place] CV LAB;  Service: Cardiovascular;  Laterality: N/A;   INTRAOPERATIVE TRANSESOPHAGEAL ECHOCARDIOGRAM N/A 10/25/2012   Procedure: INTRAOPERATIVE TRANSESOPHAGEAL ECHOCARDIOGRAM;  Surgeon: Melrose Nakayama, MD;  Location: [place];  Service: Open Heart Surgery;  Laterality: N/A;   LEFT AND RIGHT HEART CATHETERIZATION WITH CORONARY ANGIOGRAM N/A 10/22/2012   Procedure: LEFT AND RIGHT HEART CATHETERIZATION WITH CORONARY ANGIOGRAM;  Surgeon: Larey Dresser, MD;  Location: [facility] CATH LAB;  Service: Cardiovascular;  Laterality: N/A;   NASAL FRACTURE SURGERY  1970's   PACEMAKER REVISION  09/22/2015   pacemaker upgrade    PERMANENT PACEMAKER INSERTION N/A 07/15/2014   Procedure: PERMANENT PACEMAKER INSERTION;  Surgeon: Deboraha Sprang, MD;  Location: [facility] C[facility] LAB;  Service: Cardiovascular;  Laterality: N/A;   TRACHEOSTOMY  07/2012   TRACHEOSTOMY CLOSURE  08/2012   TRANSURETHRAL MICROWAVE THERAPY  10/15/2012       History of present illness and  Hospital Course:     Kindly see H&P for history of present illness and admission details, please review complete Labs, Consult reports and Test reports for all details in brief  HPI  from the history and physical done on the day of admission 03/22/2019  HPI: [name] is a 78 y.o. 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  Patient lives with daughter who has [facility].  Patient presented to [facility] ED today with SOB.  Had mild confusion at time of presentation.  Also had fallen earlier in day due to generalized  weakness.  No reported fever, chills, N/V.  Denies CP.   ED Course: New O2 requirement, 10L via HFNC, CXR shows RLL and RML PNA.  CT head and neck show nothing acute.  Creat 2.2 BUN 66 (baseline CKD stage 3, runs ~1.6).  Given IVF bolus of 1L it looks like.  D.Dimer 2000 (2.x on our scale), CRP slightly elevated.  Trop 0.08, CP free, EKG just showing RBBB and paced (  looks the same as last years EKG to me), cardiology similarly unimpressed and recd no further work up needed.  WBC 2.1k, platelets 83, HGB nl.  COVID positive.  Given solumedrol 125, also given aztreonam and vanc.  Procalcitonin negative.   Hospital Course   Acute respiratory failure with hypoxia (HCC)   CAD (coronary artery disease)   Poorly controlled type 2 diabetes mellitus with circulatory disorder (HCC)   AKI (acute kidney injury) (HCC)   Chronic diastolic heart failure (HCC)   CKD (chronic kidney disease) stage 3, GFR 30-59 ml/min (HCC)   Sacral decubitus ulcer  Acute hypoxic respiratory failure due to covid-19 pneumonia:  -Significant improvement of oxygen requirement, is on 2 L nasal cannula over last 48 hours, he will be discharged on home oxygen . -Encouraged to take his incentive spirometry and flutter valve at home and keep using them . - received Remdesivir 9/19 - 9/23 -With IV Decadron during hospital stay, no further Decadron on discharge -Received Convalescent plasma 9/23 - CRP has normalized - Maintain euvolemia/net negative.    COVID-19 Labs  Recent Labs    03/30/19 0309  DDIMER 1.60*  CRP <0.8    No results found for: SARSCOV2NAA  Penile ulcer/wound -She was noted to have penile ulcer at the head of the penis, no discharge or foul-smelling odor, instructed to continue with topical antibiotic treatment for 2 weeks, if no improvement to follow-up with his urologist Dr.Cabrewal in  [place] in 10 days to see if further work-up is indicated , I have discussed with daughter, she  understands importance of recommendation and urology follow-up if this does not resolve.  LFT elevation: -Most likely in the setting of COVID-19 infection, getting stable.  AKI on stage III CKD: - Suspect prerenal injury, creatinine peaked at 2.2, but it is improving currently, creatinine at 1.6 today. - Avoid hypotension and nephrotoxins.   Hypernatremia -resolved.    Thrombocytopenia:  -Chronic, dating back to 2017. Stable  Lymphopenia: Due to covid viral infection most likely.  -Resolved  IDT2DM:  -Labile during hospital stay, had to increase his Lantus dose, this is most likely in the setting of COVID-19 and steroids, have increased his insulin dose on discharge, this to be adjusted as needed as an outpatient.  CAD s/p CABG: No chest pain, troponin 0.08, paced rhythm without new ischemic changes on ECG.  - EDP discussed with cardiology who recommended no further work up at this time - Continue beta blocker, ASA, statin  Chronic HFpEF: Appears euvolemic -Patient euvolemic at time of discharge, have discussed with family, to continue torsemide once daily for 1 week, then go back to home dose in 7 days  Fall at home, weakness:  - Not orthostatic-sounding symptoms, suspect weakness due to covid.  - PT/OT evaluations ongoing, wife arranging 24 hr home care.  Complete heart block : s/p PPM which appears to be functioning appropriately.   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   Unstageable coccyx pressure injury: POA - Offload as able.  - WOC consulted, instituting Gerhart's butt cream TID.   Mild rhabdomyolysis: vs. covid-myopathy: With hx of fall at home, dipstick positive hgb w/o RBCs on micro and AKI. CK was checked, modestly elevated.   Hypokalemia -Repleted  Obesity: BMI 38.     Discharge Condition: Stable   Follow UP  Follow-up Information    [name], MD Follow up in 1 week(s).   Specialty: Internal Medicine Contact  information: [address] [place] [address] [phone]  Clydene Fake, MD Follow up.   Specialty: Urology Why: Please make an appointment in 10 days if penile ulcer does not resolve with antibiotics Contact information: [place]. [place] [address] [phone]             Discharge Instructions  and  Discharge Medications     Discharge Instructions    Discharge instructions   Complete by: As directed    Follow with Primary MD [name], MD   Get CBC, CMP,checked  by Primary MD next visit.    Activity: As tolerated with Full fall precautions use walker/cane & assistance as needed   Disposition Home    Diet: Heart Healthy , Carb modified , with feeding assistance and aspiration precautions.  For Heart failure patients - Check your Weight same time everyday, if you gain over 2 pounds, or you develop in leg swelling, experience more shortness of breath or chest pain, call your Primary MD immediately. Follow Cardiac Low Salt Diet and 1.5 lit/day fluid restriction.   On your next visit with your primary care physician please Get Medicines reviewed and adjusted.   Please request your Prim.MD to go over all Hospital Tests and Procedure/Radiological results at the follow up, please get all Hospital records sent to your Prim MD by signing hospital release before you go home.   If you experience worsening of your admission symptoms, develop shortness of breath, life threatening emergency, suicidal or homicidal thoughts you must seek medical attention immediately by calling 911 or calling your MD immediately  if symptoms less severe.  You Must read complete instructions/literature along with all the possible adverse reactions/side effects for all the Medicines you take and that have been prescribed to you. Take any new Medicines after you have completely understood and accpet all the possible adverse reactions/side effects.   Do not drive,  operating heavy machinery, perform activities at heights, swimming or participation in water activities or provide baby sitting services if your were admitted for syncope or siezures until you have seen by Primary MD or a Neurologist and advised to do so again.  Do not drive when taking Pain medications.    Do not take more than prescribed Pain, Sleep and Anxiety Medications  Special Instructions: If you have smoked or chewed Tobacco  in the last 2 yrs please stop smoking, stop any regular Alcohol  and or any Recreational drug use.  Wear Seat belts while driving.   Please note  You were cared for by a hospitalist during your hospital stay. If you have any questions about your discharge medications or the care you received while you were in the hospital after you are discharged, you can call the unit and asked to speak with the hospitalist on call if the hospitalist that took care of you is not available. Once you are discharged, your primary care physician will handle any further medical issues. Please note that NO REFILLS for any discharge medications will be authorized once you are discharged, as it is imperative that you return to your primary care physician (or establish a relationship with a primary care physician if you do not have one) for your aftercare needs so that they can reassess your need for medications and monitor your lab values.   Increase activity slowly   Complete by: As directed      Allergies as of 04/01/2019      Reactions   Iodinated Diagnostic Agents Other (See Comments)   Decreased kidney function  Omnipaque [iohexol] Other (See Comments)   Decreased kidney function   Primaxin [imipenem] Hives   Cephalosporins Rash   Blisters   Lisinopril Nausea And Vomiting   Drops blood pressure   Clindamycin/lincomycin Swelling, Rash      Medication List    TAKE these medications   allopurinol 300 MG tablet Commonly known as: ZYLOPRIM Take 300 mg by mouth daily.     aspirin EC 81 MG tablet Take 81 mg by mouth daily.   atorvastatin 80 MG tablet Commonly known as: LIPITOR Take 1 tablet (80 mg total) by mouth daily before breakfast. Needs appt for further refills   bacitracin ointment Apply topically 2 (two) times daily. Apply to affected area   bisoprolol 5 MG tablet Commonly known as: ZEBETA Take 0.5 tablets (2.5 mg total) by mouth daily. Needs appt for further refills   calcitRIOL 0.25 MCG capsule Commonly known as: ROCALTROL Take 0.25 mcg by mouth daily.   dextromethorphan-guaiFENesin 30-600 MG 12hr tablet Commonly known as: MUCINEX DM Take 1 tablet by mouth 2 (two) times daily.   fexofenadine 180 MG tablet Commonly known as: ALLEGRA Take 90 mg by mouth daily.   FISH OIL MAXIMUM STRENGTH PO Take 1 capsule by mouth daily.   Insulin Pen Needle 31G X 4 MM Misc 1 each by Does not apply route daily. Use to inject insulin daily   ipratropium-albuterol 0.5-2.5 (3) MG/3ML Soln Commonly known as: DUONEB TAKE 3 MLS BY NEBULIZATION EVERY 6 (SIX) HOURS AS NEEDED. DX: J44.9 What changed: reasons to take this   Klor-Con M10 10 MEQ tablet Generic drug: potassium chloride TAKE 2 TABLETS (20 MEQ TOTAL) BY MOUTH 2 (TWO) TIMES DAILY. What changed: See the new instructions.   nitroGLYCERIN 0.4 MG SL tablet Commonly known as: NITROSTAT PLACE 1 TABLET UNDER TONGUE EVERY 5 MINUTES AS NEEDED FOR CHEST PAIN What changed: See the new instructions.   OneTouch Verio test strip Generic drug: glucose blood Use to check blood sugar once a day   Ozempic (1 MG/DOSE) 2 MG/1.5ML Sopn Generic drug: Semaglutide (1 MG/DOSE) Inject 1 mg into the skin once a week. What changed: when to take this   pantoprazole 40 MG tablet Commonly known as: PROTONIX Take 40 mg by mouth at bedtime.   torsemide 20 MG tablet Commonly known as: DEMADEX Take 40 mg oral daily for 1 week, then go back to baseline dose 40 mg twice daily in 1 week What changed:   how much to  take  how to take this  when to take this  additional instructions   Tresiba FlexTouch 200 UNIT/ML Sopn Generic drug: Insulin Degludec Inject 46 Units into the skin daily. What changed: how much to take            Durable Medical Equipment  (From admission, onward)         Start     Ordered   04/01/19 1234  For home use only DME Walker rolling  Once    Question:  Patient needs a walker to treat with the following condition  Answer:  Weakness   04/01/19 1234   04/01/19 1234  For home use only DME 3 n 1  Once     04/01/19 1234   03/29/19 1457  For home use only DME oxygen  Once    Question Answer Comment  Length of Need 6 Months   Mode or (Route) Mask   Liters per Minute 3   Frequency Continuous (stationary and portable oxygen unit  needed)   Oxygen conserving device Yes   Oxygen delivery system Gas      03/29/19 1456            Diet and Activity recommendation: See Discharge Instructions above   Consults obtained - None   Major procedures and Radiology Reports - PLEASE review detailed and final reports for all details, in brief -      No results found.  Micro Results     Recent Results (from the past 240 hour(s))  MRSA PCR Screening     Status: None   Collection Time: 03/26/19  9:01 AM   Specimen: Nasopharyngeal  Result Value Ref Range Status   MRSA by PCR NEGATIVE NEGATIVE Final    Comment:        The GeneXpert MRSA Assay (FDA approved for NASAL specimens only), is one component of a comprehensive MRSA colonization surveillance program. It is not intended to diagnose MRSA infection nor to guide or monitor treatment for MRSA infections. Performed at [facility], [place] [address], [place], [place] [address]   Culture, Urine     Status: Abnormal   Collection Time: 03/28/19  2:00 AM   Specimen: Urine, Random  Result Value Ref Range Status   Specimen Description   Final    URINE, RANDOM Performed at [facility] [address], [place], [place] [address]    Special Requests   Final    NONE Performed at [facility], [place] [address] [place], [place] [address]    Culture 30,000 COLONIES/mL ESCHERICHIA COLI (A)  Final   Report Status 03/30/2019 FINAL  Final   Organism ID, Bacteria ESCHERICHIA COLI (A)  Final      Susceptibility   Escherichia coli - MIC*    AMPICILLIN >=32 RESISTANT Resistant     CEFAZOLIN >=64 RESISTANT Resistant     CEFTRIAXONE 16 INTERMEDIATE Intermediate     CIPROFLOXACIN <=0.25 SENSITIVE Sensitive     GENTAMICIN <=1 SENSITIVE Sensitive     IMIPENEM <=0.25 SENSITIVE Sensitive     NITROFURANTOIN <=16 SENSITIVE Sensitive     TRIMETH/SULFA <=20 SENSITIVE Sensitive     AMPICILLIN/SULBACTAM >=32 RESISTANT Resistant     PIP/TAZO >=128 RESISTANT Resistant     Extended ESBL NEGATIVE Sensitive     * 30,000 COLONIES/mL ESCHERICHIA COLI       Today   Subjective:   Jearld Adjutant today has no headache,no chest o rabdominal pain,feels much better wants to go home today.   Objective:   Blood pressure 129/72, pulse 77, temperature 97.9 F (36.6 C), temperature source Oral, resp. rate 20, height 5\' 5"  (1.651 m), weight 104 kg, SpO2 96 %.  No intake or output data in the 24 hours ending 04/01/19 1234  Exam Awake Alert, Oriented x 3, No new F.N deficits, Normal affect Symmetrical Chest wall movement, Good air movement bilaterally, CTAB RRR,No Gallops,Rubs or new Murmurs, No Parasternal Heave +ve B.Sounds, Abd Soft, Non tender,No rebound -guarding or rigidity. No Cyanosis, Clubbing or edema, No new Rash or bruise  Data Review   CBC w Diff:  Lab Results  Component Value Date   WBC 10.9 (H) 04/01/2019   HGB 15.5 04/01/2019   HCT 51.1 04/01/2019   PLT 176 04/01/2019   LYMPHOPCT 8 03/27/2019   BANDSPCT 62 (H) 07/20/2012   MONOPCT 6 03/27/2019   EOSPCT 0 03/27/2019   BASOPCT 0 03/27/2019    CMP:  Lab Results  Component Value  Date  NA 141 04/01/2019   NA 138 01/05/2016   K 4.0 04/01/2019   CL 98 04/01/2019   CO2 30 04/01/2019   BUN 82 (H) 04/01/2019   BUN 34 (H) 01/05/2016   CREATININE 1.62 (H) 04/01/2019   CREATININE 2.14 (H) 09/08/2015   PROT 6.4 (L) 04/01/2019   ALBUMIN 3.0 (L) 04/01/2019   BILITOT 1.1 04/01/2019   ALKPHOS 79 04/01/2019   AST 42 (H) 04/01/2019   ALT 103 (H) 04/01/2019  .   Total Time in preparing paper work, data evaluation and todays exam - 44 minutes  Phillips Climes M.D on 04/01/2019 at 12:34 PM  [facility]   Office  [phone]

## 2019-04-01 NOTE — Progress Notes (Signed)
Patient is awaiting arrival of concentrator at home to be discharged.  Wife [name] will call with an ETA for pickup as soon it arrives at home.

## 2019-04-01 NOTE — Progress Notes (Signed)
Written and verbal discharge instructions given to patient. Patient verbalized understanding.  Discharged patient to home via private vehicle.

## 2019-04-01 NOTE — Consult Note (Signed)
[facility] Nurse wound consult note Reason for Consult: Skin loss at glans penis-not a pressure injury  Wound type: intertriginous dermatitis in an uncircumcised male Pressure Injury POA: N/A Wound XBM:WUXL, moist Drainage (amount, consistency, odor) small serous Periwound:intact, poor hygiene Dressing procedure/placement/frequency: Patient does not perform adequate hygiene and when assisted by bedside nursing staff, the lesion was noted. Patient will be assisted to cleanse twice daily and reminded or assisted to apply moisture barrier ointment to the affected area after cleansing for 2 weeks.  I was assisted in this consultation by Bedside RN Seth Bake and her assistance, knowledge of the patient and flexibility is appreciated.   [place] nursing team will not follow, but will remain available to this patient, the nursing and medical teams.  Please re-consult if needed. Thanks, Maudie Flakes, MSN, RN, [place], Arther Abbott  Pager# [phone]

## 2019-04-01 NOTE — Progress Notes (Signed)
SATURATION QUALIFICATIONS: (This note is used to comply with regulatory documentation for home oxygen)  Patient Saturations on Room Air at Rest = 93%  Patient Saturations on Room Air while Ambulating = 87%  Patient Saturations on 2 Liters of oxygen while Ambulating = 93%  Please briefly explain why patient needs home oxygen:Pt needs 2L O2 to keep SpO2 greater than 88 during activity and mobility.   Maurie Boettcher, OT/L   Acute OT Clinical Specialist Acute Rehabilitation Services Pager [phone] Office [phone]

## 2019-04-01 NOTE — Discharge Instructions (Signed)
Person Under Monitoring Name: [name]  Location: [place] [address]   Infection Prevention Recommendations for Individuals Confirmed to have, or Being Evaluated for, 2019 Novel Coronavirus (COVID-19) Infection Who Receive Care at Home  Individuals who are confirmed to have, or are being evaluated for, COVID-19 should follow the prevention steps below until a healthcare provider or local or state health department says they can return to normal activities.  Stay home except to get medical care You should restrict activities outside your home, except for getting medical care. Do not go to work, school, or public areas, and do not use public transportation or taxis.  Call ahead before visiting your doctor Before your medical appointment, call the healthcare provider and tell them that you have, or are being evaluated for, COVID-19 infection. This will help the healthcare providers office take steps to keep other people from getting infected. Ask your healthcare provider to call the local or state health department.  Monitor your symptoms Seek prompt medical attention if your illness is worsening (e.g., difficulty breathing). Before going to your medical appointment, call the healthcare provider and tell them that you have, or are being evaluated for, COVID-19 infection. Ask your healthcare provider to call the local or state health department.  Wear a facemask You should wear a facemask that covers your nose and mouth when you are in the same room with other people and when you visit a healthcare provider. People who live with or visit you should also wear a facemask while they are in the same room with you.  Separate yourself from other people in your home As much as possible, you should stay in a different room from other people in your home. Also, you should use a separate bathroom, if available.  Avoid sharing household items You should not share  dishes, drinking glasses, cups, eating utensils, towels, bedding, or other items with other people in your home. After using these items, you should wash them thoroughly with soap and water.  Cover your coughs and sneezes Cover your mouth and nose with a tissue when you cough or sneeze, or you can cough or sneeze into your sleeve. Throw used tissues in a lined trash can, and immediately wash your hands with soap and water for at least 20 seconds or use an alcohol-based hand rub.  Wash your [facility] your hands often and thoroughly with soap and water for at least 20 seconds. You can use an alcohol-based hand sanitizer if soap and water are not available and if your hands are not visibly dirty. Avoid touching your eyes, nose, and mouth with unwashed hands.   Prevention Steps for Caregivers and Household Members of Individuals Confirmed to have, or Being Evaluated for, COVID-19 Infection Being Cared for in the Home  If you live with, or provide care at home for, a person confirmed to have, or being evaluated for, COVID-19 infection please follow these guidelines to prevent infection:  Follow healthcare providers instructions Make sure that you understand and can help the patient follow any healthcare provider instructions for all care.  Provide for the patients basic needs You should help the patient with basic needs in the home and provide support for getting groceries, prescriptions, and other personal needs.  Monitor the patients symptoms If they are getting sicker, call his or her medical provider and tell them that the patient has, or is being evaluated for, COVID-19 infection. This will help the healthcare providers  office take steps to keep other people from getting infected. Ask the healthcare provider to call the local or state health department.  Limit the number of people who have contact with the patient  If possible, have only one caregiver for the patient.  Other  household members should stay in another home or place of residence. If this is not possible, they should stay  in another room, or be separated from the patient as much as possible. Use a separate bathroom, if available.  Restrict visitors who do not have an essential need to be in the home.  Keep older adults, very young children, and other sick people away from the patient Keep older adults, very young children, and those who have compromised immune systems or chronic health conditions away from the patient. This includes people with chronic heart, lung, or kidney conditions, diabetes, and cancer.  Ensure good ventilation Make sure that shared spaces in the home have good air flow, such as from an air conditioner or an opened window, weather permitting.  Wash your hands often  Wash your hands often and thoroughly with soap and water for at least 20 seconds. You can use an alcohol based hand sanitizer if soap and water are not available and if your hands are not visibly dirty.  Avoid touching your eyes, nose, and mouth with unwashed hands.  Use disposable paper towels to dry your hands. If not available, use dedicated cloth towels and replace them when they become wet.  Wear a facemask and gloves  Wear a disposable facemask at all times in the room and gloves when you touch or have contact with the patients blood, body fluids, and/or secretions or excretions, such as sweat, saliva, sputum, nasal mucus, vomit, urine, or feces.  Ensure the mask fits over your nose and mouth tightly, and do not touch it during use.  Throw out disposable facemasks and gloves after using them. Do not reuse.  Wash your hands immediately after removing your facemask and gloves.  If your personal clothing becomes contaminated, carefully remove clothing and launder. Wash your hands after handling contaminated clothing.  Place all used disposable facemasks, gloves, and other waste in a lined container before  disposing them with other household waste.  Remove gloves and wash your hands immediately after handling these items.  Do not share dishes, glasses, or other household items with the patient  Avoid sharing household items. You should not share dishes, drinking glasses, cups, eating utensils, towels, bedding, or other items with a patient who is confirmed to have, or being evaluated for, COVID-19 infection.  After the person uses these items, you should wash them thoroughly with soap and water.  Wash laundry thoroughly  Immediately remove and wash clothes or bedding that have blood, body fluids, and/or secretions or excretions, such as sweat, saliva, sputum, nasal mucus, vomit, urine, or feces, on them.  Wear gloves when handling laundry from the patient.  Read and follow directions on labels of laundry or clothing items and detergent. In general, wash and dry with the warmest temperatures recommended on the label.  Clean all areas the individual has used often  Clean all touchable surfaces, such as counters, tabletops, doorknobs, bathroom fixtures, toilets, phones, keyboards, tablets, and bedside tables, every day. Also, clean any surfaces that may have blood, body fluids, and/or secretions or excretions on them.  Wear gloves when cleaning surfaces the patient has come in contact with.  Use a diluted bleach solution (e.g., dilute bleach with 1  part bleach and 10 parts water) or a household disinfectant with a label that says EPA-registered for coronaviruses. To make a bleach solution at home, add 1 tablespoon of bleach to 1 quart (4 cups) of water. For a larger supply, add  cup of bleach to 1 gallon (16 cups) of water.  Read labels of cleaning products and follow recommendations provided on product labels. Labels contain instructions for safe and effective use of the cleaning product including precautions you should take when applying the product, such as wearing gloves or eye protection  and making sure you have good ventilation during use of the product.  Remove gloves and wash hands immediately after cleaning.  Monitor yourself for signs and symptoms of illness Caregivers and household members are considered close contacts, should monitor their health, and will be asked to limit movement outside of the home to the extent possible. Follow the monitoring steps for close contacts listed on the symptom monitoring form.   ? If you have additional questions, contact your local health department or call the epidemiologist on call at [phone] (available 24/7). ? This guidance is subject to change. For the most up-to-date guidance from [facility], please refer to their website: [email or web address]

## 2019-04-03 DIAGNOSIS — I4510 Unspecified right bundle-branch block: Secondary | ICD-10-CM | POA: Diagnosis not present

## 2019-04-03 DIAGNOSIS — D631 Anemia in chronic kidney disease: Secondary | ICD-10-CM | POA: Diagnosis not present

## 2019-04-03 DIAGNOSIS — I130 Hypertensive heart and chronic kidney disease with heart failure and stage 1 through stage 4 chronic kidney disease, or unspecified chronic kidney disease: Secondary | ICD-10-CM | POA: Diagnosis not present

## 2019-04-03 DIAGNOSIS — E1165 Type 2 diabetes mellitus with hyperglycemia: Secondary | ICD-10-CM | POA: Diagnosis not present

## 2019-04-03 DIAGNOSIS — F419 Anxiety disorder, unspecified: Secondary | ICD-10-CM | POA: Diagnosis not present

## 2019-04-03 DIAGNOSIS — N183 Chronic kidney disease, stage 3 unspecified: Secondary | ICD-10-CM | POA: Diagnosis not present

## 2019-04-03 DIAGNOSIS — D696 Thrombocytopenia, unspecified: Secondary | ICD-10-CM | POA: Diagnosis not present

## 2019-04-03 DIAGNOSIS — I255 Ischemic cardiomyopathy: Secondary | ICD-10-CM | POA: Diagnosis not present

## 2019-04-03 DIAGNOSIS — E1151 Type 2 diabetes mellitus with diabetic peripheral angiopathy without gangrene: Secondary | ICD-10-CM | POA: Diagnosis not present

## 2019-04-03 DIAGNOSIS — I442 Atrioventricular block, complete: Secondary | ICD-10-CM | POA: Diagnosis not present

## 2019-04-03 DIAGNOSIS — E785 Hyperlipidemia, unspecified: Secondary | ICD-10-CM | POA: Diagnosis not present

## 2019-04-03 DIAGNOSIS — J440 Chronic obstructive pulmonary disease with acute lower respiratory infection: Secondary | ICD-10-CM | POA: Diagnosis not present

## 2019-04-03 DIAGNOSIS — E1122 Type 2 diabetes mellitus with diabetic chronic kidney disease: Secondary | ICD-10-CM | POA: Diagnosis not present

## 2019-04-03 DIAGNOSIS — G4733 Obstructive sleep apnea (adult) (pediatric): Secondary | ICD-10-CM | POA: Diagnosis not present

## 2019-04-03 DIAGNOSIS — J9601 Acute respiratory failure with hypoxia: Secondary | ICD-10-CM | POA: Diagnosis not present

## 2019-04-03 DIAGNOSIS — I70209 Unspecified atherosclerosis of native arteries of extremities, unspecified extremity: Secondary | ICD-10-CM | POA: Diagnosis not present

## 2019-04-03 DIAGNOSIS — U071 COVID-19: Secondary | ICD-10-CM | POA: Diagnosis not present

## 2019-04-03 DIAGNOSIS — I051 Rheumatic mitral insufficiency: Secondary | ICD-10-CM | POA: Diagnosis not present

## 2019-04-03 DIAGNOSIS — I0981 Rheumatic heart failure: Secondary | ICD-10-CM | POA: Diagnosis not present

## 2019-04-03 DIAGNOSIS — I5032 Chronic diastolic (congestive) heart failure: Secondary | ICD-10-CM | POA: Diagnosis not present

## 2019-04-03 DIAGNOSIS — J1289 Other viral pneumonia: Secondary | ICD-10-CM | POA: Diagnosis not present

## 2019-04-03 DIAGNOSIS — M1990 Unspecified osteoarthritis, unspecified site: Secondary | ICD-10-CM | POA: Diagnosis not present

## 2019-04-03 DIAGNOSIS — I2510 Atherosclerotic heart disease of native coronary artery without angina pectoris: Secondary | ICD-10-CM | POA: Diagnosis not present

## 2019-04-03 DIAGNOSIS — N179 Acute kidney failure, unspecified: Secondary | ICD-10-CM | POA: Diagnosis not present

## 2019-04-03 DIAGNOSIS — K219 Gastro-esophageal reflux disease without esophagitis: Secondary | ICD-10-CM | POA: Diagnosis not present

## 2019-04-07 DIAGNOSIS — U071 COVID-19: Secondary | ICD-10-CM | POA: Diagnosis not present

## 2019-04-07 DIAGNOSIS — I2510 Atherosclerotic heart disease of native coronary artery without angina pectoris: Secondary | ICD-10-CM | POA: Diagnosis not present

## 2019-04-07 DIAGNOSIS — J440 Chronic obstructive pulmonary disease with acute lower respiratory infection: Secondary | ICD-10-CM | POA: Diagnosis not present

## 2019-04-07 DIAGNOSIS — J9601 Acute respiratory failure with hypoxia: Secondary | ICD-10-CM | POA: Diagnosis not present

## 2019-04-07 DIAGNOSIS — I255 Ischemic cardiomyopathy: Secondary | ICD-10-CM | POA: Diagnosis not present

## 2019-04-07 DIAGNOSIS — J1289 Other viral pneumonia: Secondary | ICD-10-CM | POA: Diagnosis not present

## 2019-04-08 DIAGNOSIS — J9601 Acute respiratory failure with hypoxia: Secondary | ICD-10-CM | POA: Diagnosis not present

## 2019-04-08 DIAGNOSIS — J1289 Other viral pneumonia: Secondary | ICD-10-CM | POA: Diagnosis not present

## 2019-04-08 DIAGNOSIS — U071 COVID-19: Secondary | ICD-10-CM | POA: Diagnosis not present

## 2019-04-08 DIAGNOSIS — I2510 Atherosclerotic heart disease of native coronary artery without angina pectoris: Secondary | ICD-10-CM | POA: Diagnosis not present

## 2019-04-08 DIAGNOSIS — J440 Chronic obstructive pulmonary disease with acute lower respiratory infection: Secondary | ICD-10-CM | POA: Diagnosis not present

## 2019-04-08 DIAGNOSIS — I255 Ischemic cardiomyopathy: Secondary | ICD-10-CM | POA: Diagnosis not present

## 2019-04-15 DIAGNOSIS — I255 Ischemic cardiomyopathy: Secondary | ICD-10-CM | POA: Diagnosis not present

## 2019-04-15 DIAGNOSIS — J440 Chronic obstructive pulmonary disease with acute lower respiratory infection: Secondary | ICD-10-CM | POA: Diagnosis not present

## 2019-04-15 DIAGNOSIS — J1289 Other viral pneumonia: Secondary | ICD-10-CM | POA: Diagnosis not present

## 2019-04-15 DIAGNOSIS — U071 COVID-19: Secondary | ICD-10-CM | POA: Diagnosis not present

## 2019-04-15 DIAGNOSIS — I2510 Atherosclerotic heart disease of native coronary artery without angina pectoris: Secondary | ICD-10-CM | POA: Diagnosis not present

## 2019-04-15 DIAGNOSIS — J9601 Acute respiratory failure with hypoxia: Secondary | ICD-10-CM | POA: Diagnosis not present

## 2019-04-16 ENCOUNTER — Encounter: Payer: Medicare Other | Admitting: *Deleted

## 2019-04-16 DIAGNOSIS — I255 Ischemic cardiomyopathy: Secondary | ICD-10-CM | POA: Diagnosis not present

## 2019-04-16 DIAGNOSIS — J440 Chronic obstructive pulmonary disease with acute lower respiratory infection: Secondary | ICD-10-CM | POA: Diagnosis not present

## 2019-04-16 DIAGNOSIS — J9601 Acute respiratory failure with hypoxia: Secondary | ICD-10-CM | POA: Diagnosis not present

## 2019-04-16 DIAGNOSIS — U071 COVID-19: Secondary | ICD-10-CM | POA: Diagnosis not present

## 2019-04-16 DIAGNOSIS — J1289 Other viral pneumonia: Secondary | ICD-10-CM | POA: Diagnosis not present

## 2019-04-16 DIAGNOSIS — I2510 Atherosclerotic heart disease of native coronary artery without angina pectoris: Secondary | ICD-10-CM | POA: Diagnosis not present

## 2019-04-18 DIAGNOSIS — I2510 Atherosclerotic heart disease of native coronary artery without angina pectoris: Secondary | ICD-10-CM | POA: Diagnosis not present

## 2019-04-18 DIAGNOSIS — U071 COVID-19: Secondary | ICD-10-CM | POA: Diagnosis not present

## 2019-04-18 DIAGNOSIS — J1289 Other viral pneumonia: Secondary | ICD-10-CM | POA: Diagnosis not present

## 2019-04-18 DIAGNOSIS — E1122 Type 2 diabetes mellitus with diabetic chronic kidney disease: Secondary | ICD-10-CM | POA: Diagnosis not present

## 2019-04-21 DIAGNOSIS — J440 Chronic obstructive pulmonary disease with acute lower respiratory infection: Secondary | ICD-10-CM | POA: Diagnosis not present

## 2019-04-21 DIAGNOSIS — J1289 Other viral pneumonia: Secondary | ICD-10-CM | POA: Diagnosis not present

## 2019-04-21 DIAGNOSIS — I2510 Atherosclerotic heart disease of native coronary artery without angina pectoris: Secondary | ICD-10-CM | POA: Diagnosis not present

## 2019-04-21 DIAGNOSIS — I255 Ischemic cardiomyopathy: Secondary | ICD-10-CM | POA: Diagnosis not present

## 2019-04-21 DIAGNOSIS — U071 COVID-19: Secondary | ICD-10-CM | POA: Diagnosis not present

## 2019-04-21 DIAGNOSIS — J9601 Acute respiratory failure with hypoxia: Secondary | ICD-10-CM | POA: Diagnosis not present

## 2019-04-22 DIAGNOSIS — I2510 Atherosclerotic heart disease of native coronary artery without angina pectoris: Secondary | ICD-10-CM | POA: Diagnosis not present

## 2019-04-22 DIAGNOSIS — J9601 Acute respiratory failure with hypoxia: Secondary | ICD-10-CM | POA: Diagnosis not present

## 2019-04-22 DIAGNOSIS — J1289 Other viral pneumonia: Secondary | ICD-10-CM | POA: Diagnosis not present

## 2019-04-22 DIAGNOSIS — I255 Ischemic cardiomyopathy: Secondary | ICD-10-CM | POA: Diagnosis not present

## 2019-04-22 DIAGNOSIS — J440 Chronic obstructive pulmonary disease with acute lower respiratory infection: Secondary | ICD-10-CM | POA: Diagnosis not present

## 2019-04-22 DIAGNOSIS — U071 COVID-19: Secondary | ICD-10-CM | POA: Diagnosis not present

## 2019-04-30 DIAGNOSIS — I255 Ischemic cardiomyopathy: Secondary | ICD-10-CM | POA: Diagnosis not present

## 2019-04-30 DIAGNOSIS — J1289 Other viral pneumonia: Secondary | ICD-10-CM | POA: Diagnosis not present

## 2019-04-30 DIAGNOSIS — I2510 Atherosclerotic heart disease of native coronary artery without angina pectoris: Secondary | ICD-10-CM | POA: Diagnosis not present

## 2019-04-30 DIAGNOSIS — U071 COVID-19: Secondary | ICD-10-CM | POA: Diagnosis not present

## 2019-04-30 DIAGNOSIS — J9601 Acute respiratory failure with hypoxia: Secondary | ICD-10-CM | POA: Diagnosis not present

## 2019-04-30 DIAGNOSIS — J440 Chronic obstructive pulmonary disease with acute lower respiratory infection: Secondary | ICD-10-CM | POA: Diagnosis not present

## 2019-05-03 DIAGNOSIS — E785 Hyperlipidemia, unspecified: Secondary | ICD-10-CM | POA: Diagnosis not present

## 2019-05-03 DIAGNOSIS — E1165 Type 2 diabetes mellitus with hyperglycemia: Secondary | ICD-10-CM | POA: Diagnosis not present

## 2019-05-03 DIAGNOSIS — I2510 Atherosclerotic heart disease of native coronary artery without angina pectoris: Secondary | ICD-10-CM | POA: Diagnosis not present

## 2019-05-03 DIAGNOSIS — N183 Chronic kidney disease, stage 3 unspecified: Secondary | ICD-10-CM | POA: Diagnosis not present

## 2019-05-03 DIAGNOSIS — I5032 Chronic diastolic (congestive) heart failure: Secondary | ICD-10-CM | POA: Diagnosis not present

## 2019-05-03 DIAGNOSIS — J1289 Other viral pneumonia: Secondary | ICD-10-CM | POA: Diagnosis not present

## 2019-05-03 DIAGNOSIS — I4510 Unspecified right bundle-branch block: Secondary | ICD-10-CM | POA: Diagnosis not present

## 2019-05-03 DIAGNOSIS — E1151 Type 2 diabetes mellitus with diabetic peripheral angiopathy without gangrene: Secondary | ICD-10-CM | POA: Diagnosis not present

## 2019-05-03 DIAGNOSIS — E1122 Type 2 diabetes mellitus with diabetic chronic kidney disease: Secondary | ICD-10-CM | POA: Diagnosis not present

## 2019-05-03 DIAGNOSIS — K219 Gastro-esophageal reflux disease without esophagitis: Secondary | ICD-10-CM | POA: Diagnosis not present

## 2019-05-03 DIAGNOSIS — D631 Anemia in chronic kidney disease: Secondary | ICD-10-CM | POA: Diagnosis not present

## 2019-05-03 DIAGNOSIS — J9601 Acute respiratory failure with hypoxia: Secondary | ICD-10-CM | POA: Diagnosis not present

## 2019-05-03 DIAGNOSIS — F419 Anxiety disorder, unspecified: Secondary | ICD-10-CM | POA: Diagnosis not present

## 2019-05-03 DIAGNOSIS — I70209 Unspecified atherosclerosis of native arteries of extremities, unspecified extremity: Secondary | ICD-10-CM | POA: Diagnosis not present

## 2019-05-03 DIAGNOSIS — I0981 Rheumatic heart failure: Secondary | ICD-10-CM | POA: Diagnosis not present

## 2019-05-03 DIAGNOSIS — D696 Thrombocytopenia, unspecified: Secondary | ICD-10-CM | POA: Diagnosis not present

## 2019-05-03 DIAGNOSIS — M1990 Unspecified osteoarthritis, unspecified site: Secondary | ICD-10-CM | POA: Diagnosis not present

## 2019-05-03 DIAGNOSIS — G4733 Obstructive sleep apnea (adult) (pediatric): Secondary | ICD-10-CM | POA: Diagnosis not present

## 2019-05-03 DIAGNOSIS — I051 Rheumatic mitral insufficiency: Secondary | ICD-10-CM | POA: Diagnosis not present

## 2019-05-03 DIAGNOSIS — I255 Ischemic cardiomyopathy: Secondary | ICD-10-CM | POA: Diagnosis not present

## 2019-05-03 DIAGNOSIS — I442 Atrioventricular block, complete: Secondary | ICD-10-CM | POA: Diagnosis not present

## 2019-05-03 DIAGNOSIS — I130 Hypertensive heart and chronic kidney disease with heart failure and stage 1 through stage 4 chronic kidney disease, or unspecified chronic kidney disease: Secondary | ICD-10-CM | POA: Diagnosis not present

## 2019-05-03 DIAGNOSIS — N179 Acute kidney failure, unspecified: Secondary | ICD-10-CM | POA: Diagnosis not present

## 2019-05-03 DIAGNOSIS — J440 Chronic obstructive pulmonary disease with acute lower respiratory infection: Secondary | ICD-10-CM | POA: Diagnosis not present

## 2019-05-03 DIAGNOSIS — U071 COVID-19: Secondary | ICD-10-CM | POA: Diagnosis not present

## 2019-05-05 DIAGNOSIS — I119 Hypertensive heart disease without heart failure: Secondary | ICD-10-CM | POA: Diagnosis not present

## 2019-05-05 DIAGNOSIS — G4733 Obstructive sleep apnea (adult) (pediatric): Secondary | ICD-10-CM | POA: Diagnosis not present

## 2019-05-05 DIAGNOSIS — I2510 Atherosclerotic heart disease of native coronary artery without angina pectoris: Secondary | ICD-10-CM | POA: Diagnosis not present

## 2019-05-05 DIAGNOSIS — J449 Chronic obstructive pulmonary disease, unspecified: Secondary | ICD-10-CM | POA: Diagnosis not present

## 2019-05-05 DIAGNOSIS — N183 Chronic kidney disease, stage 3 unspecified: Secondary | ICD-10-CM | POA: Diagnosis not present

## 2019-05-05 DIAGNOSIS — E119 Type 2 diabetes mellitus without complications: Secondary | ICD-10-CM | POA: Diagnosis not present

## 2019-05-05 DIAGNOSIS — Z516 Encounter for desensitization to allergens: Secondary | ICD-10-CM | POA: Diagnosis not present

## 2019-05-05 DIAGNOSIS — Z23 Encounter for immunization: Secondary | ICD-10-CM | POA: Diagnosis not present

## 2019-05-06 DIAGNOSIS — I255 Ischemic cardiomyopathy: Secondary | ICD-10-CM | POA: Diagnosis not present

## 2019-05-06 DIAGNOSIS — J1289 Other viral pneumonia: Secondary | ICD-10-CM | POA: Diagnosis not present

## 2019-05-06 DIAGNOSIS — J9601 Acute respiratory failure with hypoxia: Secondary | ICD-10-CM | POA: Diagnosis not present

## 2019-05-06 DIAGNOSIS — I2510 Atherosclerotic heart disease of native coronary artery without angina pectoris: Secondary | ICD-10-CM | POA: Diagnosis not present

## 2019-05-06 DIAGNOSIS — U071 COVID-19: Secondary | ICD-10-CM | POA: Diagnosis not present

## 2019-05-06 DIAGNOSIS — J440 Chronic obstructive pulmonary disease with acute lower respiratory infection: Secondary | ICD-10-CM | POA: Diagnosis not present

## 2019-05-09 ENCOUNTER — Ambulatory Visit (INDEPENDENT_AMBULATORY_CARE_PROVIDER_SITE_OTHER): Payer: Medicare Other | Admitting: *Deleted

## 2019-05-09 DIAGNOSIS — I442 Atrioventricular block, complete: Secondary | ICD-10-CM

## 2019-05-09 DIAGNOSIS — I509 Heart failure, unspecified: Secondary | ICD-10-CM | POA: Diagnosis not present

## 2019-05-10 LAB — CUP PACEART REMOTE DEVICE CHECK
Battery Remaining Longevity: 86 mo
Battery Remaining Percentage: 89 %
Battery Voltage: 2.95 V
Brady Statistic AP VP Percent: 1 %
Brady Statistic AP VS Percent: 1 %
Brady Statistic AS VP Percent: 98 %
Brady Statistic AS VS Percent: 1 %
Brady Statistic RA Percent Paced: 1 %
Date Time Interrogation Session: 20201106173641
Implantable Lead Implant Date: 20160113
Implantable Lead Implant Date: 20160113
Implantable Lead Implant Date: 20170322
Implantable Lead Location: 753858
Implantable Lead Location: 753859
Implantable Lead Location: 753860
Implantable Lead Model: 5076
Implantable Lead Model: 5076
Implantable Pulse Generator Implant Date: 20170322
Lead Channel Impedance Value: 410 Ohm
Lead Channel Impedance Value: 450 Ohm
Lead Channel Impedance Value: 680 Ohm
Lead Channel Pacing Threshold Amplitude: 0.5 V
Lead Channel Pacing Threshold Amplitude: 0.625 V
Lead Channel Pacing Threshold Amplitude: 0.875 V
Lead Channel Pacing Threshold Pulse Width: 0.4 ms
Lead Channel Pacing Threshold Pulse Width: 0.4 ms
Lead Channel Pacing Threshold Pulse Width: 0.4 ms
Lead Channel Sensing Intrinsic Amplitude: 12 mV
Lead Channel Sensing Intrinsic Amplitude: 3.1 mV
Lead Channel Setting Pacing Amplitude: 1.625
Lead Channel Setting Pacing Amplitude: 2 V
Lead Channel Setting Pacing Amplitude: 2.5 V
Lead Channel Setting Pacing Pulse Width: 0.4 ms
Lead Channel Setting Pacing Pulse Width: 0.4 ms
Lead Channel Setting Sensing Sensitivity: 5 mV
Pulse Gen Model: 3262
Pulse Gen Serial Number: 7838612

## 2019-05-14 DIAGNOSIS — J9601 Acute respiratory failure with hypoxia: Secondary | ICD-10-CM | POA: Diagnosis not present

## 2019-05-14 DIAGNOSIS — I2510 Atherosclerotic heart disease of native coronary artery without angina pectoris: Secondary | ICD-10-CM | POA: Diagnosis not present

## 2019-05-14 DIAGNOSIS — U071 COVID-19: Secondary | ICD-10-CM | POA: Diagnosis not present

## 2019-05-14 DIAGNOSIS — I255 Ischemic cardiomyopathy: Secondary | ICD-10-CM | POA: Diagnosis not present

## 2019-05-14 DIAGNOSIS — J1289 Other viral pneumonia: Secondary | ICD-10-CM | POA: Diagnosis not present

## 2019-05-14 DIAGNOSIS — J440 Chronic obstructive pulmonary disease with acute lower respiratory infection: Secondary | ICD-10-CM | POA: Diagnosis not present

## 2019-05-21 DIAGNOSIS — I255 Ischemic cardiomyopathy: Secondary | ICD-10-CM | POA: Diagnosis not present

## 2019-05-21 DIAGNOSIS — J1289 Other viral pneumonia: Secondary | ICD-10-CM | POA: Diagnosis not present

## 2019-05-21 DIAGNOSIS — J9601 Acute respiratory failure with hypoxia: Secondary | ICD-10-CM | POA: Diagnosis not present

## 2019-05-21 DIAGNOSIS — U071 COVID-19: Secondary | ICD-10-CM | POA: Diagnosis not present

## 2019-05-21 DIAGNOSIS — J440 Chronic obstructive pulmonary disease with acute lower respiratory infection: Secondary | ICD-10-CM | POA: Diagnosis not present

## 2019-05-21 DIAGNOSIS — I2510 Atherosclerotic heart disease of native coronary artery without angina pectoris: Secondary | ICD-10-CM | POA: Diagnosis not present

## 2019-05-25 NOTE — Progress Notes (Signed)
Remote pacemaker transmission.   

## 2019-05-27 DIAGNOSIS — I255 Ischemic cardiomyopathy: Secondary | ICD-10-CM | POA: Diagnosis not present

## 2019-05-27 DIAGNOSIS — J440 Chronic obstructive pulmonary disease with acute lower respiratory infection: Secondary | ICD-10-CM | POA: Diagnosis not present

## 2019-05-27 DIAGNOSIS — J1289 Other viral pneumonia: Secondary | ICD-10-CM | POA: Diagnosis not present

## 2019-05-27 DIAGNOSIS — J9601 Acute respiratory failure with hypoxia: Secondary | ICD-10-CM | POA: Diagnosis not present

## 2019-05-27 DIAGNOSIS — U071 COVID-19: Secondary | ICD-10-CM | POA: Diagnosis not present

## 2019-05-27 DIAGNOSIS — I2510 Atherosclerotic heart disease of native coronary artery without angina pectoris: Secondary | ICD-10-CM | POA: Diagnosis not present

## 2019-06-02 ENCOUNTER — Other Ambulatory Visit (HOSPITAL_COMMUNITY): Payer: Self-pay | Admitting: Cardiology

## 2019-06-06 ENCOUNTER — Other Ambulatory Visit: Payer: Self-pay | Admitting: Internal Medicine

## 2019-06-06 ENCOUNTER — Encounter: Payer: Self-pay | Admitting: Internal Medicine

## 2019-06-09 ENCOUNTER — Other Ambulatory Visit: Payer: Self-pay

## 2019-06-09 ENCOUNTER — Ambulatory Visit (INDEPENDENT_AMBULATORY_CARE_PROVIDER_SITE_OTHER): Payer: Medicare Other | Admitting: Pulmonary Disease

## 2019-06-09 ENCOUNTER — Encounter: Payer: Self-pay | Admitting: Pulmonary Disease

## 2019-06-09 DIAGNOSIS — U071 COVID-19: Secondary | ICD-10-CM

## 2019-06-09 DIAGNOSIS — I255 Ischemic cardiomyopathy: Secondary | ICD-10-CM | POA: Diagnosis not present

## 2019-06-09 DIAGNOSIS — J432 Centrilobular emphysema: Secondary | ICD-10-CM

## 2019-06-09 DIAGNOSIS — J1289 Other viral pneumonia: Secondary | ICD-10-CM

## 2019-06-09 DIAGNOSIS — G4733 Obstructive sleep apnea (adult) (pediatric): Secondary | ICD-10-CM

## 2019-06-09 NOTE — Assessment & Plan Note (Addendum)
He appears to have recovered from this Does not seem to require oxygen anymore Advised him to take the vaccine when available

## 2019-06-09 NOTE — Progress Notes (Signed)
Subjective:    Patient ID: [name], male    DOB: Sep 07, 1940, 78 y.o.   MRN: [id number]  HPI  78 yo ex-heavy smoker for FU of COPD & OSA. He smoked 2-3 PPD until he quit in 07/2012   PMH-  CABG-AVR (bioprosthetic valve), DM-2, ischemic cardiomyopathy  He was hospitalized with Covid pneumonia 03/2019 to [place] and was discharged on oxygen.  He has improved significantly, is now off oxygen's, saturation 93% on room air, denies cough or wheezing.  He uses duo nebs to 3 times a day as needed Has gotten back on his CPAP which he feels is improved his saturations. No problems on mask or pressure.  Download was reviewed which shows no residual events on CPAP of 13 cm with large leak and excellent compliance more than 10 hours every night   Significant tests/ events reviewed  LDCT 06/2018 >> RADS 2, moderate emphysema, right lower lobe 2.6 mm nodule  10/23/12 PFTs reveal severe obstructive / restrictive disease &severely reduced DLCO.   PFT 01/21/13 >FEV1 58%, ratio 58, +25% change after BD , DLCO 38 .  07/2012  H1N1 influenza and pneumococcal PNA/ ARDS requiring tracheostomy. He also had a left empyema requiring chest tube, septic shock and AKI which resolved.     Past Medical History:  Diagnosis Date  . AAA (abdominal aortic aneurysm) ([place])    5/14 CT showed > 6 cm AAA, also right iliac aneurysm. Not stent graft candidate.   . Anemia of chronic disease   . Anxiety    pt. admits that he has anxiety at times   . Aortic stenosis    Moderate to severe by echo in 4/14. Bioprosthetic #21 [facility] Ease aortic valve replacement in 4/14.   . Arthritis   . CAD (coronary artery disease)    a. 4/14 CABG: LIMA-LAD, seq SVG-ramus & OM1, SVG-PDA  . Carotid arterial disease (HCC)    Carotid dopplers (7/00) with 17-49% LICA stenosis.   . CHF (congestive heart failure) ([place]) 07/2012; 10/17/2012  . Chronic kidney disease    increased creatinine recently - being  followed, near kidney failure fr. contrast dye   . Chronic systolic heart failure (HCC)    a. 1/14 ECHO: sev dil LV, EF30-35%, diff HK, mild MR, AS severe, AV grad 35 b. 8/14 ECHO: EF 55-60%, mild biopros AV sten mn grad. 25, RV mild dil, RA mild dil  . Complication of anesthesia    during last surgery had to be given special medicine b/c "something dropped" during surgery   . COPD (chronic obstructive pulmonary disease) ([place])    a.  History of heavy smoking. PFTs (4/14) with mixed obstructive (COPD) and restrictive (post-ARDS) picture.   . Diabetes mellitus without complication ([place])   . Esophageal reflux   . History of blood transfusion    "lots since January" (10/17/2012)  . Hyperlipidemia   . Hypertension   . Ischemic cardiomyopathy   . Ischemic cardiomyopathy    a. 4/14 LHC: pLAD 95, ost ramus 70-80, mLCx 90, EF 30%  . OSA (obstructive sleep apnea)    on cpap- every sleep time.   . Pneumonia 07/2012   PNA (H1N1 influenza + pneumococcus) in 4/49 complicated by respiratory failure/ARDS. Required tracheostomy, now weaned off. Had left empyema requiring chest tube.   . Prediabetes   . Prostate cancer [facility])     s/p radiation treatment. - (PT. DENIES)Has indwelling foley.   . Shortness of breath  still goes deer hunting by himself     Review of Systems neg for any significant sore throat, dysphagia, itching, sneezing, nasal congestion or excess/ purulent secretions, fever, chills, sweats, unintended wt loss, pleuritic or exertional cp, hempoptysis, orthopnea pnd or change in chronic leg swelling. Also denies presyncope, palpitations, heartburn, abdominal pain, nausea, vomiting, diarrhea or change in bowel or urinary habits, dysuria,hematuria, rash, arthralgias, visual complaints, headache, numbness weakness or ataxia.     Objective:   Physical Exam  Gen. Pleasant, obese, in no distress, normal affect ENT - no pallor,icterus, no post nasal drip, class 2-3 airway Neck: No JVD, no  thyromegaly, no carotid bruits Lungs: no use of accessory muscles, no dullness to percussion, decreased without rales or rhonchi  Cardiovascular: Rhythm regular, heart sounds  normal, no murmurs or gallops, 1+ peripheral edema Abdomen: soft and non-tender, no hepatosplenomegaly, BS normal. Musculoskeletal: No deformities, no cyanosis or clubbing Neuro:  alert, non focal, no tremors       Assessment & Plan:

## 2019-06-09 NOTE — Assessment & Plan Note (Signed)
We discussed long-acting bronchodilators and he would like to avoid due to high co-pays on his other medications. We will continue duo nebs every 6 hours as needed We discussed screening CT scan and he would like to hold off for now, will order again next year

## 2019-06-09 NOTE — Assessment & Plan Note (Signed)
CPAP of 13 cm seems to be working well This is really helped his daytime somnolence and fatigue  Weight loss encouraged, compliance with goal of at least 4-6 hrs every night is the expectation. Advised against medications with sedative side effects Cautioned against driving when sleepy - understanding that sleepiness will vary on a day to day basis

## 2019-06-09 NOTE — Patient Instructions (Signed)
Stay on duo nebs up to every 6 hours as needed Call if you get green sputum or worsening shortness of breath

## 2019-06-23 ENCOUNTER — Other Ambulatory Visit (HOSPITAL_COMMUNITY): Payer: Self-pay | Admitting: Cardiology

## 2019-06-30 ENCOUNTER — Other Ambulatory Visit: Payer: Self-pay

## 2019-06-30 ENCOUNTER — Encounter: Payer: Self-pay | Admitting: Internal Medicine

## 2019-06-30 ENCOUNTER — Ambulatory Visit (INDEPENDENT_AMBULATORY_CARE_PROVIDER_SITE_OTHER): Payer: Medicare Other | Admitting: Internal Medicine

## 2019-06-30 VITALS — BP 140/80 | HR 86 | Ht 65.0 in | Wt 232.0 lb

## 2019-06-30 DIAGNOSIS — I255 Ischemic cardiomyopathy: Secondary | ICD-10-CM

## 2019-06-30 DIAGNOSIS — E1159 Type 2 diabetes mellitus with other circulatory complications: Secondary | ICD-10-CM

## 2019-06-30 DIAGNOSIS — E785 Hyperlipidemia, unspecified: Secondary | ICD-10-CM | POA: Diagnosis not present

## 2019-06-30 DIAGNOSIS — Z6835 Body mass index (BMI) 35.0-35.9, adult: Secondary | ICD-10-CM

## 2019-06-30 DIAGNOSIS — E1165 Type 2 diabetes mellitus with hyperglycemia: Secondary | ICD-10-CM | POA: Diagnosis not present

## 2019-06-30 LAB — POCT GLYCOSYLATED HEMOGLOBIN (HGB A1C): Hemoglobin A1C: 6.2 % — AB (ref 4.0–5.6)

## 2019-06-30 MED ORDER — TRESIBA FLEXTOUCH 200 UNIT/ML ~~LOC~~ SOPN: 34.0000 [IU] | PEN_INJECTOR | Freq: Every day | SUBCUTANEOUS | 5 refills | Status: DC

## 2019-06-30 NOTE — Addendum Note (Signed)
Addended by: Cardell Peach I on: 06/30/2019 03:39 PM   Modules accepted: Orders

## 2019-06-30 NOTE — Patient Instructions (Addendum)
Please decrease: - Tresiba U200 to 34 units daily  Please continue: - Ozempic 1 mg weekly  Please return in 3-4 months with your sugar log.

## 2019-06-30 NOTE — Progress Notes (Signed)
Patient ID: [name], male   DOB: 01-May-1941, 78 y.o.   MRN: [id number]   This visit occurred during the SARS-CoV-2 public health emergency.  Safety protocols were in place, including screening questions prior to the visit, additional usage of staff PPE, and extensive cleaning of exam room while observing appropriate contact time as indicated for disinfecting solutions.   HPI: [name] is a 78 y.o.-year-old male, referred by his cardiologist, Dr. 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  Last visit 7 months ago.  He had Covid19 03/2019 >> sugars much higher >> Tresiba increased. He is feeling better now, recovered well, but, per wife, who accompanied patient today, he still has some confusion and disorientation.  Reviewed HbA1c levels: Lab Results  Component Value Date   HGBA1C 7.1 (A) 11/18/2018   HGBA1C 6.6 (A) 08/30/2018   HGBA1C 8.3 (A) 04/30/2018   HGBA1C 5.5 10/24/2012   HGBA1C 6.4 (H) 07/24/2012   Pt is on a regimen of: - Tresiba 36 >> 26 >> 30 >> 46 >> 40 units daily - restarted 03/06/2018 - Ozempic 0.5 >> 1 mg weekly-started 04/2018 -no GI side effects.  This is expensive for him, at $180, but he tells me that is not completely unaffordable. He was on Januvia 50 mg daily, but this did not help >> stopped 2018. He was on Jardiance.  He checks his sugars once daily: - am: 135-237, 256 >> 130-155, 178 >> 141-188 (before and after coffee) >> 89-130, 145 - 2h after b'fast: n/c - before lunch: n/c >> 111-143, 148 >> 148-181 >> n/c - 2h after lunch: n/c >> 126-183 >> n/c - before dinner: n/c >> 95, 133-163, 177 >> 111-168, 182 >> n/c - 2h after dinner: n/c >> 139-165, 212 >> 143-210 >> n/c - bedtime: n/c >> 151 >> n/c - nighttime: n/c Lowest sugar was 135 >> 95 >> 111 >> 89; it is unclear at which level he has hypoglycemia  awareness. Highest sugar was 256 >> 212 >> 210 >> 145.  Glucometer: One Touch Verio  Pt's meals are: - Breakfast: Banana, 2 clementines, different fruit in season, low-salt chips - Lunch: Meat, 2 veggies, toast, and sweet tea - Dinner: Sandwich (banana or meat, tomato), 1 Clementine - Snacks: Frequently after dinner He was exercising on the recumbent bike for 4-5 times a week >> not now >> plans to restart.  -+ CKD, last BUN/creatinine:  Lab Results  Component Value Date   BUN 82 (H) 04/01/2019   BUN 79 (H) 03/31/2019   CREATININE 1.62 (H) 04/01/2019   CREATININE 1.60 (H) 03/31/2019   -+ HL; last set of lipids: Lab Results  Component Value Date   CHOL 99 07/04/2018   HDL 20 (L) 07/04/2018   LDLCALC 56 07/04/2018   LDLDIRECT 61.5 05/15/2013   TRIG 114 07/04/2018   CHOLHDL 5.0 07/04/2018  On Lipitor 80.  His cardiologist also recommended Vascepa - samples for 6 weeks -but he could not afford this.  He is on fish oil.  - last eye exam was in 11/2018: No DR reportedly. Small cataracts.  - no numbness and tingling in his feet.  Pt has FH of DM in S, B, Father.  He also has HTN, OSA.  ROS: Constitutional: no weight gain/no weight loss, no fatigue, no subjective hyperthermia, no subjective hypothermia Eyes: no blurry vision, no xerophthalmia ENT:  no sore throat, no nodules palpated in neck, no dysphagia, no odynophagia, no hoarseness Cardiovascular: no CP/no SOB/no palpitations/no leg swelling Respiratory: no cough/no SOB/no wheezing Gastrointestinal: no N/no V/no D/no C/no acid reflux Musculoskeletal: no muscle aches/no joint aches Skin: no rashes, no hair loss Neurological: no tremors/no numbness/no tingling/no dizziness  I reviewed pt's medications, allergies, PMH, social hx, family hx, and changes were documented in the history of present illness. Otherwise, unchanged from my initial visit note.  Past Medical History:  Diagnosis Date  . AAA (abdominal aortic  aneurysm) ([place])    5/14 CT showed > 6 cm AAA, also right iliac aneurysm. Not stent graft candidate.   . Anemia of chronic disease   . Anxiety    pt. admits that he has anxiety at times   . Aortic stenosis    Moderate to severe by echo in 4/14. Bioprosthetic #21 [facility] Ease aortic valve replacement in 4/14.   . Arthritis   . CAD (coronary artery disease)    a. 4/14 CABG: LIMA-LAD, seq SVG-ramus & OM1, SVG-PDA  . Carotid arterial disease (HCC)    Carotid dopplers (1/32) with 44-01% LICA stenosis.   . CHF (congestive heart failure) ([place]) 07/2012; 10/17/2012  . Chronic kidney disease    increased creatinine recently - being followed, near kidney failure fr. contrast dye   . Chronic systolic heart failure (HCC)    a. 1/14 ECHO: sev dil LV, EF30-35%, diff HK, mild MR, AS severe, AV grad 35 b. 8/14 ECHO: EF 55-60%, mild biopros AV sten mn grad. 25, RV mild dil, RA mild dil  . Complication of anesthesia    during last surgery had to be given special medicine b/c "something dropped" during surgery   . COPD (chronic obstructive pulmonary disease) ([place])    a.  History of heavy smoking. PFTs (4/14) with mixed obstructive (COPD) and restrictive (post-ARDS) picture.   . Diabetes mellitus without complication ([place])   . Esophageal reflux   . History of blood transfusion    "lots since January" (10/17/2012)  . Hyperlipidemia   . Hypertension   . Ischemic cardiomyopathy   . Ischemic cardiomyopathy    a. 4/14 LHC: pLAD 95, ost ramus 70-80, mLCx 90, EF 30%  . OSA (obstructive sleep apnea)    on cpap- every sleep time.   . Pneumonia 07/2012   PNA (H1N1 influenza + pneumococcus) in 0/27 complicated by respiratory failure/ARDS. Required tracheostomy, now weaned off. Had left empyema requiring chest tube.   . Prediabetes   . Prostate cancer [facility])     s/p radiation treatment. - (PT. DENIES)Has indwelling foley.   . Shortness of breath    still goes deer hunting by himself   Past Surgical History:   Procedure Laterality Date  . ABDOMINAL AORTIC ANEURYSM REPAIR N/A 07/10/2013   Procedure: Resection and Graftiong of perirenal AAA; Insertion 14 x 8 Hemashield Graft Aorta to Left Common Iliac and to Right Common Femoral Artery With Ligastion of Right External and Interanl Iliac Artery;  Surgeon: Mal Misty, MD;  Location: [place];  Service: Vascular;  Laterality: N/A;  . ANAL FISSURE REPAIR  2008  . AORTIC VALVE REPLACEMENT N/A 10/25/2012   Procedure: AORTIC VALVE REPLACEMENT (AVR);  Surgeon: Melrose Nakayama, MD;  Location: [place];  Service: Open Heart Surgery;  Laterality: N/A;  . CARDIAC VALVE REPLACEMENT    . CORONARY ARTERY BYPASS GRAFT N/A 10/25/2012   Procedure: CORONARY ARTERY BYPASS GRAFTING (CABG);  Surgeon: Melrose Nakayama, MD;  Location: [facility] OR;  Service: Open Heart Surgery;  Laterality: N/A;  times 4 using left internal mammary artery and endoscopically harvested bilateral saphenous vein   . EP IMPLANTABLE DEVICE N/A 09/22/2015   Procedure: BiV Upgrade;  Surgeon: Deboraha Sprang, MD;  Location: [place] CV LAB;  Service: Cardiovascular;  Laterality: N/A;  . INTRAOPERATIVE TRANSESOPHAGEAL ECHOCARDIOGRAM N/A 10/25/2012   Procedure: INTRAOPERATIVE TRANSESOPHAGEAL ECHOCARDIOGRAM;  Surgeon: Melrose Nakayama, MD;  Location: [place];  Service: Open Heart Surgery;  Laterality: N/A;  . LEFT AND RIGHT HEART CATHETERIZATION WITH CORONARY ANGIOGRAM N/A 10/22/2012   Procedure: LEFT AND RIGHT HEART CATHETERIZATION WITH CORONARY ANGIOGRAM;  Surgeon: Larey Dresser, MD;  Location: [facility] CATH LAB;  Service: Cardiovascular;  Laterality: N/A;  . NASAL FRACTURE SURGERY  1970's  . PACEMAKER REVISION  09/22/2015   pacemaker upgrade   . PERMANENT PACEMAKER INSERTION N/A 07/15/2014   Procedure: PERMANENT PACEMAKER INSERTION;  Surgeon: Deboraha Sprang, MD;  Location: [facility] CATH LAB;  Service: Cardiovascular;  Laterality: N/A;  . TRACHEOSTOMY  07/2012  . TRACHEOSTOMY CLOSURE  08/2012  . TRANSURETHRAL  MICROWAVE THERAPY  10/15/2012   Social History   Socioeconomic History  . Marital status: Married    Spouse name: Not on file  . Number of children:  2  . Years of education: Not on file  . Highest education level: Not on file  Occupational History  .  Retired  Scientific laboratory technician  . Financial resource strain: Not on file  . Food insecurity:    Worry: Not on file    Inability: Not on file  . Transportation needs:    Medical: Not on file    Non-medical: Not on file  Tobacco Use  . Smoking status: Former Smoker    Packs/day: 2.00    Years: 58.00    Pack years: 116.00    Types: Cigarettes    Last attempt to quit: 07/20/2012    Years since quitting: 5.7  . Smokeless tobacco: Never Used  Substance and Sexual Activity  . Alcohol use: No    Alcohol/week:  Beer once or twice a year: 2 drinks    Comment: 10/17/2012 "years since I've had a drink; never had problem with it"  . Drug use: No  . Sexual activity: Not Currently   Current Outpatient Medications on File Prior to Visit  Medication Sig Dispense Refill  . allopurinol (ZYLOPRIM) 300 MG tablet Take 300 mg by mouth daily.    Marland Kitchen aspirin EC 81 MG tablet Take 81 mg by mouth daily.    Marland Kitchen atorvastatin (LIPITOR) 80 MG tablet Take 1 tablet (80 mg total) by mouth daily before breakfast. Needs appt for further refills 90 tablet 0  . bacitracin ointment Apply topically 2 (two) times daily. Apply to affected area 120 g 0  . bisoprolol (ZEBETA) 5 MG tablet TAKE 1/2 TABLETS (2.5 MG TOTAL) BY MOUTH DAILY. NEEDS APPT FOR FURTHER REFILLS 23 tablet 0  . calcitRIOL (ROCALTROL) 0.25 MCG capsule Take 0.25 mcg by mouth daily.     Marland Kitchen dextromethorphan-guaiFENesin (MUCINEX DM) 30-600 MG 12hr tablet Take 1 tablet by mouth 2 (two) times daily.    . fexofenadine (ALLEGRA) 180 MG tablet Take 90 mg by mouth daily.     Marland Kitchen glucose blood (ONETOUCH VERIO) test strip Use to check blood sugar once a day 100 each 12  . Insulin Degludec (TRESIBA FLEXTOUCH) 200 UNIT/ML SOPN  Inject 46 Units into the skin daily. 6 pen 5  . Insulin Pen  Needle 31G X 4 MM MISC 1 each by Does not apply route daily. Use to inject insulin daily 100 each 2  . ipratropium-albuterol (DUONEB) 0.5-2.5 (3) MG/3ML SOLN TAKE 3 MLS BY NEBULIZATION EVERY 6 (SIX) HOURS AS NEEDED. DX: J44.9 (Patient taking differently: Take 3 mLs by nebulization every 6 (six) hours as needed (shortness of breath). Dx: J44.9) 360 mL 5  . nitroGLYCERIN (NITROSTAT) 0.4 MG SL tablet PLACE 1 TABLET UNDER TONGUE EVERY 5 MINUTES AS NEEDED FOR CHEST PAIN (Patient taking differently: Place 0.4 mg under the tongue every 5 (five) minutes as needed for chest pain. ) 25 tablet 0  . Omega-3 Fatty Acids (FISH OIL MAXIMUM STRENGTH PO) Take 1 capsule by mouth daily.     Marland Kitchen OZEMPIC, 1 MG/DOSE, 2 MG/1.5ML SOPN INJECT 1 MG INTO THE SKIN ONCE A WEEK. 9 pen 1  . pantoprazole (PROTONIX) 40 MG tablet Take 40 mg by mouth at bedtime.     . Potassium Chloride (KLOR-CON 10 PO) Take by mouth daily.    Marland Kitchen torsemide (DEMADEX) 20 MG tablet TAKE 2 TABLETS (40 MG TOTAL) BY MOUTH 2 (TWO) TIMES DAILY. 360 tablet 0   No current facility-administered medications on file prior to visit.   Allergies  Allergen Reactions  . Iodinated Diagnostic Agents Other (See Comments)    Decreased kidney function  . Omnipaque [Iohexol] Other (See Comments)    Decreased kidney function  . Primaxin [Imipenem] Hives  . Cephalosporins Rash    Blisters   . Lisinopril Nausea And Vomiting    Drops blood pressure   . Clindamycin/Lincomycin Swelling and Rash   Family History  Problem Relation Age of Onset  . Cancer Mother   . Heart disease Father   . Congestive Heart Failure Other   . Heart disease Other    Also,  hyperlipidemia, heart disease, hypertension and brother. Thyroid disease and cancer in mother.  PE: BP 140/80   Pulse 86   Ht 5\' 5"  (1.651 m)   Wt 232 lb (105.2 kg)   SpO2 96%   BMI 38.61 kg/m  Wt Readings from Last 3 Encounters:  06/30/19 232 lb  (105.2 kg)  06/09/19 229 lb 6.4 oz (104.1 kg)  03/23/19 229 lb 4.5 oz (104 kg)   Constitutional: overweight, in NAD Eyes: PERRLA, EOMI, no exophthalmos ENT: moist mucous membranes, no thyromegaly, no cervical lymphadenopathy Cardiovascular: RRR, No MRG Respiratory: CTA B Gastrointestinal: abdomen soft, NT, ND, BS+ Musculoskeletal: no deformities, strength intact in all 4 Skin: moist, warm, no rashes Neurological: no tremor with outstretched hands, DTR normal in all 4  ASSESSMENT: 1. 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 HL  3.  Obesity  PLAN:  1. Patient with longstanding, previously uncontrolled type 2 diabetes, on basal insulin and weekly GLP-1 receptor agonist.  He has CKD so we could not add an SGLT2 inhibitor, which would have been ideal for his history of CHF.  At last visit he described difficulty paying for [place] and Ozempic, but he opted to continue with them. -At last visit, sugars are higher at all times of the day especially in the morning so we increased the dose of insulin. -At this visit, sugars are better, but he is on higher dose of Tresiba compared to last visit, after his Covid 19 infection.  He recovered well and started to decrease his insulin dose  but he still at 40 units daily.  Based on his sugars, we can increase the dose to 34 units daily and I am hoping that at next visit we can reduce this in the morning. - I suggested to:  Patient Instructions  Please decrease: - Tresiba U200 to 34 units daily  Please continue: - Ozempic 1 mg weekly  Please return in 3-4 months with your sugar log.   - we checked his HbA1c: 6.2% (lower!) - advised to check sugars at different times of the day - 1-2x a day, rotating check times - advised for yearly eye exams >> he is UTD - return to clinic in 3-4 months  2. HL -Reviewed latest  lipid panel from 07/2018: HDL low, the rest of the fractions at goal Lab Results  Component Value Date   CHOL 99 07/04/2018   HDL 20 (L) 07/04/2018   LDLCALC 56 07/04/2018   LDLDIRECT 61.5 05/15/2013   TRIG 114 07/04/2018   CHOLHDL 5.0 07/04/2018  -Continues Lipitor and fish oil without side effects.  3.  Obesity -Continue Ozempic which should also help with weight loss -Continue exercise on the recumbent bike -He lost net 4 pounds since last visit  Philemon Kingdom, MD PhD [facility] Endocrinology

## 2019-07-22 ENCOUNTER — Ambulatory Visit (HOSPITAL_COMMUNITY)
Admission: RE | Admit: 2019-07-22 | Discharge: 2019-07-22 | Disposition: A | Discharge status: Home / Self Care | Payer: Medicare Other | Source: Ambulatory Visit | Attending: Cardiology | Admitting: Cardiology

## 2019-07-22 ENCOUNTER — Other Ambulatory Visit: Payer: Self-pay

## 2019-07-22 DIAGNOSIS — I5032 Chronic diastolic (congestive) heart failure: Secondary | ICD-10-CM

## 2019-07-22 NOTE — Progress Notes (Signed)
Called patient to review AVS.  rx faxed to [facility] for lab work.  appts made 6 week f/u.  Pt amenable to instructions.  Verbalized understanding. avs mailed.

## 2019-07-22 NOTE — Progress Notes (Signed)
Heart Failure TeleHealth Note  Due to national recommendations of social distancing due to [place] 19, Audio/video telehealth visit is felt to be most appropriate for this patient at this time.  See MyChart message from today for patient consent regarding telehealth for [facility].  Date:  07/22/2019   ID:  [name], DOB [birth year], MRN [id number]  Location: Home  Provider location: [place] [facility] Failure Type of Visit: Established patient   PCP:  [name], MD  HF Cardiologist:  Dr. [name]  Chief Complaint:  Fatigue   History of Present Illness: [name] is a 79 y.o. male who presents via audio/video conferencing for a telehealth visit today.     he denies symptoms worrisome for COVID 19.   Patient with history of ARDS/respiratory failure and tracheostomy in 07/2012 as well as DM, HTN, and COPD presented to the ER at [facility] with CHF in 10/2012. Patient had a prolonged hospitalization in 07/2012 with H1N1 influenza and pneumococcal PNA. This progressed to ARDS. He was intubated and ended up with tracheostomy. He had the trach removed. He also had a left empyema requiring chest tube, septic shock with elevated troponin, and AKI which resolved.  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   He was admitted again in 10/2012 from his nursing home with exertional dyspnea and orthopnea. He had gained 21 lbs. Echo at admission showed EF 35% with global hypokinesis that looked worse in the anteroseptal wall. He also was noted to have aortic stenosis rated as moderate to severe. He was diuresed and cathed, showing severe 3 vessel disease. He then had CABG-AVR (bioprosthetic valve).   He had a large AAA and underwent surgical repair in 07/2013.  He had AKI and Pseudomonas PNA post-operatively.  He was discharged to a nursing home. Echo in 1/15 showed EF 55-60% with mildly dilated and dysfunctional RV and a bioprosthetic aortic valve with mean gradient 32 mmHg  (higher than expected).   Repeat limited echo in 08/2013 showed shower aortic valve mean gradient of 21 mmHg.   In 11/15, patient had a holter monitor placed for bradycardia.  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  Baseline bifascicular block.  His HR also was noted to drop into the 40s with exercise, suggesting exercise-associated heart block.  He underwent Medtronic-PCM placement in 1/16 by Dr Caryl Comes.   Echo in 10/16 showed fall in EF to 30% with mean aortic valve gradient 21 mmHg.  Repeat echo in 2/17 showed EF 20-25%, mean aortic valve gradient 24 mmHg with moderately decreased RV systolic function.  Cardiolite in 10/16 showed inferior scar with no ischemia.  He is RV pacing almost all the time.  Low dose DSE was done in 2/17 to assess for significant bioprosthetic valve stenosis => mean gradient with dobutamine was only 31 mmHg, so suspect no more than moderate stenosis.  He had St Jude CRT upgrade in 3/17.  He decided against ICD.  Repeat echo post-CRT in 8/17 showed EF 50%, diffuse hypokinesis, bioprosthetic aortic valve with mean gradient 27 mmHg.  Echo in 9/18 showed EF 50-55%, bioprosthetic aortic valve with mean gradient 25 mmHg.  Echo in 10/19 showed EF 50-55%, bioprosthetic aortic valve with mean gradient 25 mmHg.   In 9/20, he was admitted with COVID-19 infection.  He feels like he still has a "brain fog" from this experience.    Symptomatically, he is getting back to normal post-COVID but is still weak/fatigued.  He uses a scooter in the  grocery store.  No dyspnea walking around his house.  He has chronically used 2 pillows.  No PND.  No chest pain.  No lightheadedness or syncope.  He is not using oxygen. Weight has been stable at home.   Labs (2/15): K 4, creatinine 1.6, BNP 166 Labs (3/15): Hgb 11.6, pro-BNP 1037, creatinine 1.76, BUN 30, LDL 75, HDL 26 Labs (8/15): K 3.9, creatinine 1.55, HCT 40.6, LDL 71, HDL 27 Labs (1/16): K 4.3, Creatinine 1.6, HCT 42.9 Labs (2/17): K  4.0 Creatinine 1.64, TSH normal.  Labs (3/17): K 4.5, creatinine 1.49, HCT 42.9 Labs (4/17): K 4, creatinine 1.48, BNP 125 Labs (6/17): LDL 27, HDL 24, TGs 245 Labs (7/17): K 4.9, creatinine 1.71 Labs (8/17): K 4.3, creatinine 1.7, LDL 64, HDl 25, TGs 198 Lab (06/16/2016): K 4.9 Creatinine 1.84  Labs (10/2016): Creatinine 1.5 K 3.9  Labs (6/18): K 4.4, creatinine 1.57, BNP 55 Labs (7/18): LDL 58, HDL 22, creatinine 1.64 Labs (11/19): K 3.9, creatinine 1.5  Labs (9/20): K 4, creatinine 1.62, hgb 15.5  1. PNA (H1N1 influenza + pneumococcus) in 2/58 complicated by respiratory failure/ARDS. Required tracheostomy, now weaned off. Had left empyema requiring chest tube.  Has developed post-infectious pulmonary fibrosis.  2. Prostate CA s/p radiation treatment. Has indwelling foley.  3. OSA: using CPAP 4. HTN  5. Hyperlipidemia  6. COPD: History of heavy smoking. PFTs (4/14) with mixed obstructive (COPD) and restrictive (post-ARDS) picture.  - CT chest 12/19 consistent with emphysema.  7. Type II diabetes  8. Anemia of chronic disease.  9. Ischemic cardiomyopathy:  - Echo (1/14) with severely dilated LV, EF 30-35%, diffuse hypokinesis worse in the anteroseptal wall, grade II diastolic dysfunction, mild MR, AS interpreted as "moderate to severe" with aortic valve mean gradient 23 mmHg.  - Echo (4/14) witih EF 35-40%, mid to apical anteroseptal akinesis, moderate to severe AS with mean gradient 33 mmHg, AVA 0.91 cm^2.  -Echo (5/14) post CABG showed EF 50%, anteroseptal hypokinesis, bioprosthetic aortic valve well-seated. - Echo (1/15) with EF 55-60%, mild LVH, mildly dilated RV with mildly decreased systolic function, D-shaped interventricular septum, bioprosthetic aortic valve with mean gardient 32 mmHg.   - Echo (3/15) with EF 55-60%, mild LVH, bioprosthetic aortic valve with mean gradient 21 mmHg (lower), no AI, RV moderately dilated with mild to moderately decreased systolic function.   - Echo  (10/16) with EF 30%, bioprosthetic aortic valve with mean gradient 21 mmHg.   - Echo (2/17) with EF 20-25%, mild LVH, aortic valve mean gradient 24 mmHg/peak 39 mmHg, RV moderately dilated with moderately decreased systolic function.  - St Jude CRT upgrade in 3/17 (did not want ICD).  - Echo (8/17) with EF 50%, diffuse hypokinesis, bioprosthetic aortic valve with mean gradient 27 mmHg, normal RV size with mildly decreased systolic function.   - AKI with lisinopril.  - Echo (9/18): EF 50-55%, bioprosthetic aortic valve with mean gradient 25 mmHg.  - Echo (10/19): EF 50-55%, bioprosthetic aortic valve with mean gradient 25 mmHg, normal RV size and systolic function.  10. Aortic stenosis: Moderate to severe by echo in 4/14. Bioprosthetic #21 [facility] Ease aortic valve replacement in 4/14.  Low dose DSE (2/17) showed that Mr Bacchi likely has no more than moderate bioprosthetic valve stenosis with mean gradient up to 31 mmHg with dobutamine.  11. CAD: LHC (4/14) with 95% pLAD, 70-80% ostial ramus, 70% mLCx, 90% pRCA, EF 35%.  - CABG 4/14 with LIMA-LAD, seq SVG-ramus and OM1, SVG-PDA.  -  Cardiolite (10/16) with EF 42%, fixed inferior defect, no ischemia.   12. Carotid stenosis: Carotid dopplers (2/99) with 24-26% LICA stenosis.  Carotids (8/34) with 19-62% LICA stenosis. Carotids (2/29) with 79-89% LICA stenosis.  - Carotid dopplers (2/11): 94-17% LICA stenosis.  - Carotid dopplers (4/08): 14-48% LICA stenosis.  13. AAA: 5/14 CT showed > 6 cm AAA, also right iliac aneurysm. Not stent graft candidate.  7/14 CT showed 7 cm AAA.  Surgical repair 1/15.  14. CKD: AKI in 7/14 from contrast nephropathy.  AKI in 1/15 after AAA repair.  15. Contrast allergy.  16. ABIs 9/14 were normal.  17. Gout 18. 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  HR drops with exercise (likely exercise-induced heart block).  Medtronic dual chamber PPM placed in 2016.  Recent pacemaker interrogation  showed complete heart block.  He is pacemaker-dependent, upgraded to CRT in 3/17.  19. COVID-19 PNA in 9/20  SH: Quit smoking in 1/14, married, no ETOH, lives in [place].   FH: No premature CAD  ROS: All systems reviewed and negative except as per HPI.    Current Outpatient Medications  Medication Sig Dispense Refill  . allopurinol (ZYLOPRIM) 300 MG tablet Take 300 mg by mouth daily.    Marland Kitchen aspirin EC 81 MG tablet Take 81 mg by mouth daily.    Marland Kitchen atorvastatin (LIPITOR) 80 MG tablet Take 1 tablet (80 mg total) by mouth daily before breakfast. Needs appt for further refills 90 tablet 0  . bacitracin ointment Apply topically 2 (two) times daily. Apply to affected area 120 g 0  . bisoprolol (ZEBETA) 5 MG tablet TAKE 1/2 TABLETS (2.5 MG TOTAL) BY MOUTH DAILY. NEEDS APPT FOR FURTHER REFILLS 23 tablet 0  . calcitRIOL (ROCALTROL) 0.25 MCG capsule Take 0.25 mcg by mouth daily.     Marland Kitchen dextromethorphan-guaiFENesin (MUCINEX DM) 30-600 MG 12hr tablet Take 1 tablet by mouth 2 (two) times daily.    . fexofenadine (ALLEGRA) 180 MG tablet Take 90 mg by mouth daily.     Marland Kitchen glucose blood (ONETOUCH VERIO) test strip Use to check blood sugar once a day 100 each 12  . Insulin Degludec (TRESIBA FLEXTOUCH) 200 UNIT/ML SOPN Inject 34 Units into the skin daily. 6 pen 5  . Insulin Pen Needle 31G X 4 MM MISC 1 each by Does not apply route daily. Use to inject insulin daily 100 each 2  . ipratropium-albuterol (DUONEB) 0.5-2.5 (3) MG/3ML SOLN TAKE 3 MLS BY NEBULIZATION EVERY 6 (SIX) HOURS AS NEEDED. DX: J44.9 (Patient taking differently: Take 3 mLs by nebulization every 6 (six) hours as needed (shortness of breath). Dx: J44.9) 360 mL 5  . nitroGLYCERIN (NITROSTAT) 0.4 MG SL tablet PLACE 1 TABLET UNDER TONGUE EVERY 5 MINUTES AS NEEDED FOR CHEST PAIN (Patient taking differently: Place 0.4 mg under the tongue every 5 (five) minutes as needed for chest pain. ) 25 tablet 0  . Omega-3 Fatty Acids (FISH OIL MAXIMUM STRENGTH PO) Take  1 capsule by mouth daily.     Marland Kitchen OZEMPIC, 1 MG/DOSE, 2 MG/1.5ML SOPN INJECT 1 MG INTO THE SKIN ONCE A WEEK. 9 pen 1  . pantoprazole (PROTONIX) 40 MG tablet Take 40 mg by mouth at bedtime.     . Potassium Chloride (KLOR-CON 10 PO) Take by mouth daily.    Marland Kitchen torsemide (DEMADEX) 20 MG tablet TAKE 2 TABLETS (40 MG TOTAL) BY MOUTH 2 (TWO) TIMES DAILY. 360 tablet 0   No current facility-administered medications  for this encounter.    There were no vitals filed for this visit.  Wt Readings from Last 3 Encounters:  06/30/19 105.2 kg (232 lb)  06/09/19 104.1 kg (229 lb 6.4 oz)  03/23/19 104 kg (229 lb 4.5 oz)   Exam:  (Video/Tele Health Call; Exam is subjective and or/visual.) General:  Speaks in full sentences. No resp difficulty. Lungs: Normal respiratory effort with conversation.  Abdomen: Non-distended per patient report Extremities: Pt denies edema. Neuro: Alert & oriented x 3.   ASSESSMENT & PLAN: 1. CAD: Status post CABG for 3VD.  Cardiolite in 10/16 with inferior scar, no ischemia.  No chest pain.  - Continue ASA, statin.   2. Chronic systolic CHF:  Echo in 4/16 showed EF down to 20-25%.  No ischemia on Cardiolite.  Constant RV pacing may have led to decline in EF.  We considered bioprosthetic valve stenosis as cause of fall in EF, but low gradient DSE did not suggest severe stenosis.  Echo 8/17 after CRT upgrade shows improved EF to 50%.  Last echo in 10/19 with EF 50-55%.  Weight has been stable.  NYHA class III symptoms, but think this may be residual fatigue from COVID infection.  - Continue bisoprolol 2.5 mg daily.   - AKI with lisinopril, will leave off.  - Continue torsemide 40 mg bid.  Needs BMET, I will arrange.   - I will arrange for BMET.  3. 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 Suspect that intrinsic lung disease (post-infectious pulmonary fibrosis) is a contributor to his dyspnea and hypoxemia. He is now off oxygen (just using CPAP at night).  He  had emphysema noted on 12/19 CT.   4. Carotid stenosis: Followed at [facility] needs appointment.  5. Hyperlipidemia: Check lipids today.  6. AAA: s/p surgical repair. 7. Status post bioprosthetic AVR: Gradient across the aortic valve has been elevated. There was some concern for possible low gradient severe bioprosthetic aortic stenosis based on past echo, but low dose DSE suggested that valvular stenosis was no more than moderate.  Mean gradient 25 mmHg across bioprosthetic valve on echo 10/19, this is stable compared to prior echo.    - He will need antibiotic prophylaxis with dental work.  - I am going to obtain repeat echo.  8. CKD: Stage III. Check BMET today.  9. 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  S/p Medtronic PCM 1/16.   Patient is pacemaker-dependent with complete heart block.  He had upgrade to CRT in 3/17.   10. OSA: He is on CPAP.  Continue CPAP nightly.  11. Gout: on allopurinol.  COVID screen The patient does not have any symptoms that suggest any further testing/ screening at this time.  Social distancing reinforced today.  Patient Risk: After full review of this patients clinical status, I feel that they are at moderate risk for cardiac decompensation at this time.  Relevant cardiac medications were reviewed at length with the patient today. The patient does not have concerns regarding their medications at this time.   Recommended follow-up:  Followup 6 wks with echo   Today, I have spent  18 minutes with the patient with telehealth technology discussing the above issues .    Signed, Loralie Champagne, MD  07/22/2019 = [facility] [address] [facility] and [place] [address] [phone] (office) [phone] (fax)

## 2019-07-22 NOTE — Patient Instructions (Signed)
No medication changes today!  Labs requisition was faxed to [facility] in [place]. Please complete as soon as possible.   We will only contact you if something comes back abnormal or we need to make some changes. Otherwise no news is good news!'   Your physician recommends that you schedule a follow-up appointment in: 6 weeks ECHO:  Tuesday, March 2nd, 2021 at 10:15am  Dr [name]: Tuesday, March 2nd, 2021 at 11:20am  [place]  Please call office at [phone] option 2 if you have any questions or concerns.   At the [facility], you and your health needs are our priority. As part of our continuing mission to provide you with exceptional heart care, we have created designated Provider Care Teams. These Care Teams include your primary Cardiologist (physician) and Advanced Practice Providers (APPs- Physician Assistants and Nurse Practitioners) who all work together to provide you with the care you need, when you need it.   You may see any of the following providers on your designated Care Team at your next follow up: Marland Kitchen Dr Glori Bickers . Dr Loralie Champagne . Darrick Grinder, NP . Lyda Jester, PA . Audry Riles, PharmD   Please be sure to bring in all your medications bottles to every appointment.

## 2019-07-24 ENCOUNTER — Other Ambulatory Visit: Payer: Self-pay | Admitting: Cardiology

## 2019-07-24 DIAGNOSIS — I5032 Chronic diastolic (congestive) heart failure: Secondary | ICD-10-CM | POA: Diagnosis not present

## 2019-07-25 LAB — BASIC METABOLIC PANEL
BUN/Creatinine Ratio: 23 (ref 10–24)
BUN: 40 mg/dL — ABNORMAL HIGH (ref 8–27)
CO2: 27 mmol/L (ref 20–29)
Calcium: 9.6 mg/dL (ref 8.6–10.2)
Chloride: 101 mmol/L (ref 96–106)
Creatinine, Ser: 1.74 mg/dL — ABNORMAL HIGH (ref 0.76–1.27)
GFR calc Af Amer: 43 mL/min/{1.73_m2} — ABNORMAL LOW (ref >59)
GFR calc non Af Amer: 37 mL/min/{1.73_m2} — ABNORMAL LOW (ref >59)
Glucose: 146 mg/dL — ABNORMAL HIGH (ref 65–99)
Potassium: 3.6 mmol/L (ref 3.5–5.2)
Sodium: 142 mmol/L (ref 134–144)

## 2019-07-25 LAB — BRAIN NATRIURETIC PEPTIDE: BNP: 69.6 pg/mL (ref 0.0–100.0)

## 2019-07-25 LAB — LIPID PANEL W/O CHOL/HDL RATIO
Cholesterol, Total: 118 mg/dL (ref 100–199)
HDL: 23 mg/dL — ABNORMAL LOW (ref >39)
LDL Chol Calc (NIH): 55 mg/dL (ref 0–99)
Triglycerides: 251 mg/dL — ABNORMAL HIGH (ref 0–149)
VLDL Cholesterol Cal: 40 mg/dL (ref 5–40)

## 2019-07-25 LAB — SPECIMEN STATUS REPORT

## 2019-08-04 ENCOUNTER — Other Ambulatory Visit (HOSPITAL_COMMUNITY): Payer: Self-pay | Admitting: Cardiology

## 2019-08-05 DIAGNOSIS — I2510 Atherosclerotic heart disease of native coronary artery without angina pectoris: Secondary | ICD-10-CM | POA: Diagnosis not present

## 2019-08-05 DIAGNOSIS — I739 Peripheral vascular disease, unspecified: Secondary | ICD-10-CM | POA: Diagnosis not present

## 2019-08-05 DIAGNOSIS — J449 Chronic obstructive pulmonary disease, unspecified: Secondary | ICD-10-CM | POA: Diagnosis not present

## 2019-08-05 DIAGNOSIS — Z8546 Personal history of malignant neoplasm of prostate: Secondary | ICD-10-CM | POA: Diagnosis not present

## 2019-08-05 DIAGNOSIS — E559 Vitamin D deficiency, unspecified: Secondary | ICD-10-CM | POA: Diagnosis not present

## 2019-08-05 DIAGNOSIS — Z0001 Encounter for general adult medical examination with abnormal findings: Secondary | ICD-10-CM | POA: Diagnosis not present

## 2019-08-05 DIAGNOSIS — Z0389 Encounter for observation for other suspected diseases and conditions ruled out: Secondary | ICD-10-CM | POA: Diagnosis not present

## 2019-08-05 DIAGNOSIS — I119 Hypertensive heart disease without heart failure: Secondary | ICD-10-CM | POA: Diagnosis not present

## 2019-08-05 DIAGNOSIS — E119 Type 2 diabetes mellitus without complications: Secondary | ICD-10-CM | POA: Diagnosis not present

## 2019-08-05 DIAGNOSIS — G4733 Obstructive sleep apnea (adult) (pediatric): Secondary | ICD-10-CM | POA: Diagnosis not present

## 2019-08-08 ENCOUNTER — Encounter: Payer: Self-pay | Admitting: Internal Medicine

## 2019-08-08 ENCOUNTER — Ambulatory Visit (INDEPENDENT_AMBULATORY_CARE_PROVIDER_SITE_OTHER): Payer: Medicare Other | Admitting: *Deleted

## 2019-08-08 ENCOUNTER — Other Ambulatory Visit (HOSPITAL_COMMUNITY): Payer: Self-pay | Admitting: Cardiology

## 2019-08-08 DIAGNOSIS — I509 Heart failure, unspecified: Secondary | ICD-10-CM | POA: Diagnosis not present

## 2019-08-08 LAB — CUP PACEART REMOTE DEVICE CHECK
Battery Remaining Longevity: 87 mo
Battery Remaining Percentage: 89 %
Battery Voltage: 2.95 V
Brady Statistic AP VP Percent: 1 %
Brady Statistic AP VS Percent: 1 %
Brady Statistic AS VP Percent: 98 %
Brady Statistic AS VS Percent: 1 %
Brady Statistic RA Percent Paced: 1 %
Date Time Interrogation Session: 20210205143639
Implantable Lead Implant Date: 20160113
Implantable Lead Implant Date: 20160113
Implantable Lead Implant Date: 20170322
Implantable Lead Location: 753858
Implantable Lead Location: 753859
Implantable Lead Location: 753860
Implantable Lead Model: 5076
Implantable Lead Model: 5076
Implantable Pulse Generator Implant Date: 20170322
Lead Channel Impedance Value: 430 Ohm
Lead Channel Impedance Value: 490 Ohm
Lead Channel Impedance Value: 630 Ohm
Lead Channel Pacing Threshold Amplitude: 0.5 V
Lead Channel Pacing Threshold Amplitude: 0.75 V
Lead Channel Pacing Threshold Amplitude: 0.875 V
Lead Channel Pacing Threshold Pulse Width: 0.4 ms
Lead Channel Pacing Threshold Pulse Width: 0.4 ms
Lead Channel Pacing Threshold Pulse Width: 0.4 ms
Lead Channel Sensing Intrinsic Amplitude: 12 mV
Lead Channel Sensing Intrinsic Amplitude: 3.1 mV
Lead Channel Setting Pacing Amplitude: 1.75 V
Lead Channel Setting Pacing Amplitude: 2 V
Lead Channel Setting Pacing Amplitude: 2.5 V
Lead Channel Setting Pacing Pulse Width: 0.4 ms
Lead Channel Setting Pacing Pulse Width: 0.4 ms
Lead Channel Setting Sensing Sensitivity: 5 mV
Pulse Gen Model: 3262
Pulse Gen Serial Number: 7838612

## 2019-08-08 NOTE — Progress Notes (Signed)
PPM Remote  

## 2019-08-08 NOTE — Progress Notes (Signed)
Received labs from PCP, drawn on 08/06/2019: Lipids: 116/202/24/52 BUN/creatinine 45/1.6, GFR 41.  AST/ALT 72/33, alkaline phosphatase 134 (40-129) PTH 45.7 calcium 9.7 (8.6-10.4) Vitamin D 46.5

## 2019-09-02 ENCOUNTER — Ambulatory Visit (HOSPITAL_BASED_OUTPATIENT_CLINIC_OR_DEPARTMENT_OTHER)
Admission: RE | Admit: 2019-09-02 | Discharge: 2019-09-02 | Disposition: A | Discharge status: Home / Self Care | Payer: Medicare Other | Source: Ambulatory Visit | Attending: Cardiology | Admitting: Cardiology

## 2019-09-02 ENCOUNTER — Encounter (HOSPITAL_COMMUNITY): Payer: Self-pay | Admitting: Cardiology

## 2019-09-02 ENCOUNTER — Ambulatory Visit (HOSPITAL_COMMUNITY)
Admission: RE | Admit: 2019-09-02 | Discharge: 2019-09-02 | Disposition: A | Discharge status: Home / Self Care | Payer: Medicare Other | Source: Ambulatory Visit | Attending: Cardiology | Admitting: Cardiology

## 2019-09-02 ENCOUNTER — Other Ambulatory Visit: Payer: Self-pay

## 2019-09-02 VITALS — BP 124/72 | HR 83 | Wt 228.2 lb

## 2019-09-02 DIAGNOSIS — Z951 Presence of aortocoronary bypass graft: Secondary | ICD-10-CM | POA: Insufficient documentation

## 2019-09-02 DIAGNOSIS — Z8616 Personal history of COVID-19: Secondary | ICD-10-CM | POA: Diagnosis not present

## 2019-09-02 DIAGNOSIS — Z7982 Long term (current) use of aspirin: Secondary | ICD-10-CM | POA: Diagnosis not present

## 2019-09-02 DIAGNOSIS — I130 Hypertensive heart and chronic kidney disease with heart failure and stage 1 through stage 4 chronic kidney disease, or unspecified chronic kidney disease: Secondary | ICD-10-CM | POA: Insufficient documentation

## 2019-09-02 DIAGNOSIS — Z87891 Personal history of nicotine dependence: Secondary | ICD-10-CM | POA: Insufficient documentation

## 2019-09-02 DIAGNOSIS — Z953 Presence of xenogenic heart valve: Secondary | ICD-10-CM | POA: Insufficient documentation

## 2019-09-02 DIAGNOSIS — I5032 Chronic diastolic (congestive) heart failure: Secondary | ICD-10-CM

## 2019-09-02 DIAGNOSIS — Z923 Personal history of irradiation: Secondary | ICD-10-CM | POA: Insufficient documentation

## 2019-09-02 DIAGNOSIS — J449 Chronic obstructive pulmonary disease, unspecified: Secondary | ICD-10-CM | POA: Diagnosis not present

## 2019-09-02 DIAGNOSIS — Z79899 Other long term (current) drug therapy: Secondary | ICD-10-CM | POA: Diagnosis not present

## 2019-09-02 DIAGNOSIS — Z950 Presence of cardiac pacemaker: Secondary | ICD-10-CM | POA: Diagnosis not present

## 2019-09-02 DIAGNOSIS — G4733 Obstructive sleep apnea (adult) (pediatric): Secondary | ICD-10-CM | POA: Diagnosis not present

## 2019-09-02 DIAGNOSIS — N183 Chronic kidney disease, stage 3 unspecified: Secondary | ICD-10-CM | POA: Diagnosis not present

## 2019-09-02 DIAGNOSIS — I255 Ischemic cardiomyopathy: Secondary | ICD-10-CM | POA: Diagnosis not present

## 2019-09-02 DIAGNOSIS — Z8546 Personal history of malignant neoplasm of prostate: Secondary | ICD-10-CM | POA: Insufficient documentation

## 2019-09-02 DIAGNOSIS — E1122 Type 2 diabetes mellitus with diabetic chronic kidney disease: Secondary | ICD-10-CM | POA: Insufficient documentation

## 2019-09-02 DIAGNOSIS — I6522 Occlusion and stenosis of left carotid artery: Secondary | ICD-10-CM | POA: Insufficient documentation

## 2019-09-02 DIAGNOSIS — M109 Gout, unspecified: Secondary | ICD-10-CM | POA: Insufficient documentation

## 2019-09-02 DIAGNOSIS — I2510 Atherosclerotic heart disease of native coronary artery without angina pectoris: Secondary | ICD-10-CM | POA: Diagnosis not present

## 2019-09-02 DIAGNOSIS — I5022 Chronic systolic (congestive) heart failure: Secondary | ICD-10-CM | POA: Insufficient documentation

## 2019-09-02 DIAGNOSIS — I442 Atrioventricular block, complete: Secondary | ICD-10-CM | POA: Diagnosis not present

## 2019-09-02 DIAGNOSIS — Z794 Long term (current) use of insulin: Secondary | ICD-10-CM | POA: Insufficient documentation

## 2019-09-02 DIAGNOSIS — E785 Hyperlipidemia, unspecified: Secondary | ICD-10-CM | POA: Diagnosis not present

## 2019-09-02 DIAGNOSIS — Z91041 Radiographic dye allergy status: Secondary | ICD-10-CM | POA: Diagnosis not present

## 2019-09-02 LAB — BASIC METABOLIC PANEL
Anion gap: 8 (ref 5–15)
BUN: 45 mg/dL — ABNORMAL HIGH (ref 8–23)
CO2: 29 mmol/L (ref 22–32)
Calcium: 9.5 mg/dL (ref 8.9–10.3)
Chloride: 102 mmol/L (ref 98–111)
Creatinine, Ser: 1.8 mg/dL — ABNORMAL HIGH (ref 0.61–1.24)
GFR calc Af Amer: 41 mL/min — ABNORMAL LOW (ref >60)
GFR calc non Af Amer: 36 mL/min — ABNORMAL LOW (ref >60)
Glucose, Bld: 129 mg/dL — ABNORMAL HIGH (ref 70–99)
Potassium: 3.5 mmol/L (ref 3.5–5.1)
Sodium: 139 mmol/L (ref 135–145)

## 2019-09-02 NOTE — Progress Notes (Signed)
  Echocardiogram 2D Echocardiogram has been performed.  [name] 09/02/2019, 11:10 AM

## 2019-09-02 NOTE — Patient Instructions (Addendum)
No medication changes today!   Labs today We will only contact you if something comes back abnormal or we need to make some changes. Otherwise no news is good news!   Your physician recommends that you schedule a follow-up appointment in: 3 months with Dr [name].  Next appointment:  Wednesday, June 2nd, 2021 at 12pm [place]   Please call office at [phone] option 2 if you have any questions or concerns.    At the [facility], you and your health needs are our priority. As part of our continuing mission to provide you with exceptional heart care, we have created designated Provider Care Teams. These Care Teams include your primary Cardiologist (physician) and Advanced Practice Providers (APPs- Physician Assistants and Nurse Practitioners) who all work together to provide you with the care you need, when you need it.   You may see any of the following providers on your designated Care Team at your next follow up: Marland Kitchen Dr Glori Bickers . Dr Loralie Champagne . Darrick Grinder, NP . Lyda Jester, PA . Audry Riles, PharmD   Please be sure to bring in all your medications bottles to every appointment.

## 2019-09-02 NOTE — Progress Notes (Signed)
ID:  [name], DOB [birth year], MRN [id number]   Provider location: [facility] Type of Visit: Established patient   PCP:  [name], MD  HF Cardiologist:  Dr. [name]   History of Present Illness: [name] is a 79 y.o. male who has a history of ARDS/respiratory failure and tracheostomy in 07/2012 as well as DM, HTN, and COPD presented to the ER at [facility] with CHF in 10/2012. Patient had a prolonged hospitalization in 07/2012 with H1N1 influenza and pneumococcal PNA. This progressed to ARDS. He was intubated and ended up with tracheostomy. He had the trach removed. He also had a left empyema requiring chest tube, septic shock with elevated troponin, and AKI which resolved.  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   He was admitted again in 10/2012 from his nursing home with exertional dyspnea and orthopnea. He had gained 21 lbs. Echo at admission showed EF 35% with global hypokinesis that looked worse in the anteroseptal wall. He also was noted to have aortic stenosis rated as moderate to severe. He was diuresed and cathed, showing severe 3 vessel disease. He then had CABG-AVR (bioprosthetic valve).   He had a large AAA and underwent surgical repair in 07/2013.  He had AKI and Pseudomonas PNA post-operatively.  He was discharged to a nursing home. Echo in 1/15 showed EF 55-60% with mildly dilated and dysfunctional RV and a bioprosthetic aortic valve with mean gradient 32 mmHg (higher than expected).   Repeat limited echo in 08/2013 showed shower aortic valve mean gradient of 21 mmHg.   In 11/15, patient had a holter monitor placed for bradycardia.  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  Baseline bifascicular block.  His HR also was noted to drop into the 40s with exercise, suggesting exercise-associated heart block.  He underwent Medtronic-PCM placement in 1/16 by Dr Caryl Comes.   Echo in 10/16 showed fall in EF to 30% with mean aortic valve  gradient 21 mmHg.  Repeat echo in 2/17 showed EF 20-25%, mean aortic valve gradient 24 mmHg with moderately decreased RV systolic function.  Cardiolite in 10/16 showed inferior scar with no ischemia.  He is RV pacing almost all the time.  Low dose DSE was done in 2/17 to assess for significant bioprosthetic valve stenosis => mean gradient with dobutamine was only 31 mmHg, so suspect no more than moderate stenosis.  He had St Jude CRT upgrade in 3/17.  He decided against ICD.  Repeat echo post-CRT in 8/17 showed EF 50%, diffuse hypokinesis, bioprosthetic aortic valve with mean gradient 27 mmHg.  Echo in 9/18 showed EF 50-55%, bioprosthetic aortic valve with mean gradient 25 mmHg.  Echo in 10/19 showed EF 50-55%, bioprosthetic aortic valve with mean gradient 25 mmHg.   In 9/20, he was admitted with COVID-19 infection.    Echo was done today and reviewed. EF 50-55% with mild LVH, mild RV enlargement with normal function, PASP 25, bioprosthetic aortic valve with mean gradient 24 and AVA 1.38 cm^2.     He is doing well symptomatically.  Seems to have recovered completely now from his COVID infection.  He is short of breath walking longer distances like around the grocery store.  No problems walking around his house and walking into the office today. No chest pain.  No lightheadedness.    St Jude device interrogation: Thoracic impedance stable, 98% BiV pacing  Labs (2/15): K 4, creatinine 1.6, BNP 166 Labs (3/15): Hgb 11.6, pro-BNP 1037,  creatinine 1.76, BUN 30, LDL 75, HDL 26 Labs (8/15): K 3.9, creatinine 1.55, HCT 40.6, LDL 71, HDL 27 Labs (1/16): K 4.3, Creatinine 1.6, HCT 42.9 Labs (2/17): K 4.0 Creatinine 1.64, TSH normal.  Labs (3/17): K 4.5, creatinine 1.49, HCT 42.9 Labs (4/17): K 4, creatinine 1.48, BNP 125 Labs (6/17): LDL 27, HDL 24, TGs 245 Labs (7/17): K 4.9, creatinine 1.71 Labs (8/17): K 4.3, creatinine 1.7, LDL 64, HDl 25, TGs 198 Lab (06/16/2016): K 4.9 Creatinine 1.84  Labs  (10/2016): Creatinine 1.5 K 3.9  Labs (6/18): K 4.4, creatinine 1.57, BNP 55 Labs (7/18): LDL 58, HDL 22, creatinine 1.64 Labs (11/19): K 3.9, creatinine 1.5  Labs (9/20): K 4, creatinine 1.62, hgb 15.5 Labs (2/21): LDL 52, HDL 24, TGs 202, hgb 14.2, K 3.8, creatinine 1.6  1. PNA (H1N1 influenza + pneumococcus) in 1/93 complicated by respiratory failure/ARDS. Required tracheostomy, now weaned off. Had left empyema requiring chest tube.  Has developed post-infectious pulmonary fibrosis.  2. Prostate CA s/p radiation treatment. Has indwelling foley.  3. OSA: using CPAP 4. HTN  5. Hyperlipidemia  6. COPD: History of heavy smoking. PFTs (4/14) with mixed obstructive (COPD) and restrictive (post-ARDS) picture.  - CT chest 12/19 consistent with emphysema.  7. Type II diabetes  8. Anemia of chronic disease.  9. Ischemic cardiomyopathy:  - Echo (1/14) with severely dilated LV, EF 30-35%, diffuse hypokinesis worse in the anteroseptal wall, grade II diastolic dysfunction, mild MR, AS interpreted as "moderate to severe" with aortic valve mean gradient 23 mmHg.  - Echo (4/14) witih EF 35-40%, mid to apical anteroseptal akinesis, moderate to severe AS with mean gradient 33 mmHg, AVA 0.91 cm^2.  -Echo (5/14) post CABG showed EF 50%, anteroseptal hypokinesis, bioprosthetic aortic valve well-seated. - Echo (1/15) with EF 55-60%, mild LVH, mildly dilated RV with mildly decreased systolic function, D-shaped interventricular septum, bioprosthetic aortic valve with mean gardient 32 mmHg.   - Echo (3/15) with EF 55-60%, mild LVH, bioprosthetic aortic valve with mean gradient 21 mmHg (lower), no AI, RV moderately dilated with mild to moderately decreased systolic function.   - Echo (10/16) with EF 30%, bioprosthetic aortic valve with mean gradient 21 mmHg.   - Echo (2/17) with EF 20-25%, mild LVH, aortic valve mean gradient 24 mmHg/peak 39 mmHg, RV moderately dilated with moderately decreased systolic function.  -  St Jude CRT upgrade in 3/17 (did not want ICD).  - Echo (8/17) with EF 50%, diffuse hypokinesis, bioprosthetic aortic valve with mean gradient 27 mmHg, normal RV size with mildly decreased systolic function.   - AKI with lisinopril.  - Echo (9/18): EF 50-55%, bioprosthetic aortic valve with mean gradient 25 mmHg.  - Echo (10/19): EF 50-55%, bioprosthetic aortic valve with mean gradient 25 mmHg, normal RV size and systolic function.  - Echo (3/21): EF 50-55% with mild LVH, mild RV enlargement with normal function, PASP 25, bioprosthetic aortic valve with mean gradient 24 and AVA 1.38 cm^2.    10. Aortic stenosis: Moderate to severe by echo in 4/14. Bioprosthetic #21 [facility] Ease aortic valve replacement in 4/14.  Low dose DSE (2/17) showed that Mr Spano likely has no more than moderate bioprosthetic valve stenosis with mean gradient up to 31 mmHg with dobutamine.  11. CAD: LHC (4/14) with 95% pLAD, 70-80% ostial ramus, 70% mLCx, 90% pRCA, EF 35%.  - CABG 4/14 with LIMA-LAD, seq SVG-ramus and OM1, SVG-PDA.  - Cardiolite (10/16) with EF 42%, fixed inferior defect, no ischemia.  12. Carotid stenosis: Carotid dopplers (1/91) with 47-82% LICA stenosis.  Carotids (9/56) with 21-30% LICA stenosis. Carotids (8/65) with 78-46% LICA stenosis.  - Carotid dopplers (9/62): 95-28% LICA stenosis.  - Carotid dopplers (4/13): 24-40% LICA stenosis.  13. AAA: 5/14 CT showed > 6 cm AAA, also right iliac aneurysm. Not stent graft candidate.  7/14 CT showed 7 cm AAA.  Surgical repair 1/15.  14. CKD: AKI in 7/14 from contrast nephropathy.  AKI in 1/15 after AAA repair.  15. Contrast allergy.  16. ABIs 9/14 were normal.  17. Gout 18. 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  HR drops with exercise (likely exercise-induced heart block).  Medtronic dual chamber PPM placed in 2016.  Recent pacemaker interrogation showed complete heart block.  He is pacemaker-dependent, upgraded to CRT in  3/17.  19. COVID-19 PNA in 9/20  SH: Quit smoking in 1/14, married, no ETOH, lives in [place].   FH: No premature CAD  ROS: All systems reviewed and negative except as per HPI.    Current Outpatient Medications  Medication Sig Dispense Refill  . allopurinol (ZYLOPRIM) 300 MG tablet Take 300 mg by mouth daily.    Marland Kitchen amoxicillin (AMOXIL) 500 MG capsule TAKE 4 CAPS 1 HOUR PRIOR TO TREATMENT    . aspirin EC 81 MG tablet Take 81 mg by mouth daily.    Marland Kitchen atorvastatin (LIPITOR) 80 MG tablet Take 1 tablet (80 mg total) by mouth daily before breakfast. Needs appt for further refills 90 tablet 0  . bacitracin ointment Apply topically 2 (two) times daily. Apply to affected area 120 g 0  . bisoprolol (ZEBETA) 5 MG tablet TAKE 1/2 TABLETS (2.5 MG TOTAL) BY MOUTH DAILY. NEEDS APPT FOR FURTHER REFILLS 23 tablet 0  . calcitRIOL (ROCALTROL) 0.25 MCG capsule Take 0.25 mcg by mouth daily.     Marland Kitchen dextromethorphan-guaiFENesin (MUCINEX DM) 30-600 MG 12hr tablet Take 1 tablet by mouth 2 (two) times daily.    . fexofenadine (ALLEGRA) 180 MG tablet Take 90 mg by mouth daily.     Marland Kitchen glucose blood (ONETOUCH VERIO) test strip Use to check blood sugar once a day 100 each 12  . Insulin Degludec (TRESIBA FLEXTOUCH) 200 UNIT/ML SOPN Inject 34 Units into the skin daily. 6 pen 5  . Insulin Pen Needle 31G X 4 MM MISC 1 each by Does not apply route daily. Use to inject insulin daily 100 each 2  . ipratropium-albuterol (DUONEB) 0.5-2.5 (3) MG/3ML SOLN Take 3 mLs by nebulization every 6 (six) hours as needed (for shortness of breath).    . Omega-3 Fatty Acids (FISH OIL MAXIMUM STRENGTH PO) Take 1 capsule by mouth daily.     Marland Kitchen OZEMPIC, 1 MG/DOSE, 2 MG/1.5ML SOPN INJECT 1 MG INTO THE SKIN ONCE A WEEK. 9 pen 1  . pantoprazole (PROTONIX) 40 MG tablet Take 40 mg by mouth at bedtime.     . Potassium Chloride (KLOR-CON 10 PO) Take by mouth daily.    Marland Kitchen torsemide (DEMADEX) 20 MG tablet TAKE 2 TABLETS (40 MG TOTAL) BY MOUTH 2 (TWO) TIMES  DAILY. 360 tablet 0   No current facility-administered medications for this encounter.    Vitals:   09/02/19 1118  BP: 124/72  Pulse: 83  SpO2: 98%  Weight: 103.5 kg (228 lb 3.2 oz)    Wt Readings from Last 3 Encounters:  09/02/19 103.5 kg (228 lb 3.2 oz)  06/30/19 105.2 kg (232 lb)  06/09/19 104.1 kg (229  lb 6.4 oz)   General: NAD Neck: No JVD, no thyromegaly or thyroid nodule.  Lungs: Clear to auscultation bilaterally with normal respiratory effort. CV: Nondisplaced PMI.  Heart regular S1/S2, no S3/S4, 2/6 early SEM RUSB with clear S2.  Trace edema.  No carotid bruit.  Normal pedal pulses.  Abdomen: Soft, nontender, no hepatosplenomegaly, no distention.  Skin: Intact without lesions or rashes.  Neurologic: Alert and oriented x 3.  Psych: Normal affect. Extremities: No clubbing or cyanosis.  HEENT: Normal.     ASSESSMENT & PLAN: 1. CAD: Status post CABG for 3VD.  Cardiolite in 10/16 with inferior scar, no ischemia.  No chest pain.  - Continue ASA, statin.   2. Chronic systolic CHF:  Echo in 7/78 showed EF down to 20-25%.  No ischemia on Cardiolite.  Constant RV pacing may have led to decline in EF.  We considered bioprosthetic valve stenosis as cause of fall in EF, but low gradient DSE did not suggest severe stenosis.  Echo 8/17 after CRT upgrade shows improved EF to 50%.  Echo today showed EF 50-55%.  Weight has been stable.  NYHA class II symptoms, not volume overloaded on exam.   - Continue bisoprolol 2.5 mg daily.   - AKI with lisinopril, will leave off.  - Continue torsemide 40 mg bid.  BMET today.  3. 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 Suspect that intrinsic lung disease (post-infectious pulmonary fibrosis) is a contributor to his dyspnea and hypoxemia. He is now off oxygen (just using CPAP at night).  He had emphysema noted on 12/19 CT.   4. Carotid stenosis: Followed at [facility]. 5. Hyperlipidemia: Good lipids in 2/21.   6. AAA: s/p surgical  repair. 7. Status post bioprosthetic AVR: Gradient across the aortic valve has been elevated. There was some concern for possible low gradient severe bioprosthetic aortic stenosis, but low dose DSE in 2017 suggested that valvular stenosis was no more than moderate.  Mean gradient 24 mmHg across bioprosthetic valve on today's echo, this is stable compared to prior echoes.    - He will need antibiotic prophylaxis with dental work.  - If bioprosthetic aortic valve stenosis worsens, he would be a candidate for TAVR.  8. CKD: Stage III. Check BMET today.  9. 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  S/p Medtronic PCM 1/16.   Patient is pacemaker-dependent with complete heart block.  He had upgrade to CRT in 3/17.   10. OSA: He is on CPAP.  Continue CPAP nightly.  11. Gout: on allopurinol.  Followup in 3 months.   Signed, Loralie Champagne, MD  09/02/2019  [place] [address] [facility] [place] [address] [phone] (office) [phone] (fax)

## 2019-09-22 ENCOUNTER — Other Ambulatory Visit (HOSPITAL_COMMUNITY): Payer: Self-pay | Admitting: Cardiology

## 2019-09-23 ENCOUNTER — Other Ambulatory Visit (HOSPITAL_COMMUNITY): Payer: Self-pay | Admitting: Cardiology

## 2019-09-24 ENCOUNTER — Encounter (HOSPITAL_COMMUNITY): Payer: Self-pay

## 2019-09-30 ENCOUNTER — Other Ambulatory Visit: Payer: Self-pay

## 2019-10-10 ENCOUNTER — Other Ambulatory Visit (HOSPITAL_COMMUNITY): Payer: Self-pay | Admitting: Cardiology

## 2019-10-20 ENCOUNTER — Other Ambulatory Visit (HOSPITAL_COMMUNITY): Payer: Self-pay

## 2019-10-20 MED ORDER — BISOPROLOL FUMARATE 5 MG PO TABS: ORAL_TABLET | ORAL | 3 refills | Status: DC

## 2019-10-28 ENCOUNTER — Other Ambulatory Visit: Payer: Self-pay

## 2019-10-30 ENCOUNTER — Encounter: Payer: Self-pay | Admitting: Internal Medicine

## 2019-10-30 ENCOUNTER — Ambulatory Visit (INDEPENDENT_AMBULATORY_CARE_PROVIDER_SITE_OTHER): Payer: Medicare Other | Admitting: Internal Medicine

## 2019-10-30 ENCOUNTER — Other Ambulatory Visit: Payer: Self-pay

## 2019-10-30 VITALS — BP 120/60 | HR 86 | Ht 65.0 in | Wt 227.0 lb

## 2019-10-30 DIAGNOSIS — E1159 Type 2 diabetes mellitus with other circulatory complications: Secondary | ICD-10-CM | POA: Diagnosis not present

## 2019-10-30 DIAGNOSIS — Z6835 Body mass index (BMI) 35.0-35.9, adult: Secondary | ICD-10-CM

## 2019-10-30 DIAGNOSIS — E1165 Type 2 diabetes mellitus with hyperglycemia: Secondary | ICD-10-CM

## 2019-10-30 DIAGNOSIS — I2510 Atherosclerotic heart disease of native coronary artery without angina pectoris: Secondary | ICD-10-CM | POA: Diagnosis not present

## 2019-10-30 DIAGNOSIS — E785 Hyperlipidemia, unspecified: Secondary | ICD-10-CM

## 2019-10-30 LAB — POCT GLYCOSYLATED HEMOGLOBIN (HGB A1C): Hemoglobin A1C: 6.4 % — AB (ref 4.0–5.6)

## 2019-10-30 MED ORDER — OZEMPIC (1 MG/DOSE) 4 MG/3ML ~~LOC~~ SOPN: 1.0000 mg | PEN_INJECTOR | SUBCUTANEOUS | 3 refills | Status: DC

## 2019-10-30 NOTE — Progress Notes (Signed)
Patient ID: [name], male   DOB: 1941-04-25, 79 y.o.   MRN: [id number]   This visit occurred during the SARS-CoV-2 public health emergency.  Safety protocols were in place, including screening questions prior to the visit, additional usage of staff PPE, and extensive cleaning of exam room while observing appropriate contact time as indicated for disinfecting solutions.   HPI: [name] is a 80 y.o.-year-old male, referred by his cardiologist, Dr. 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  Last visit 4 months ago.  He had Covid19 03/2019 >> sugars much higher >> Tresiba increased.  However, at last visit, sugars started to improve and we decreased the dose.  Reviewed HbA1c levels: Lab Results  Component Value Date   HGBA1C 6.2 (A) 06/30/2019   HGBA1C 7.1 (A) 11/18/2018   HGBA1C 6.6 (A) 08/30/2018   HGBA1C 8.3 (A) 04/30/2018   HGBA1C 5.5 10/24/2012   HGBA1C 6.4 (H) 07/24/2012   Pt is on a regimen of: - Tresiba 36 >> 26 >> 30 >> 46 >> 40 >> 34 units daily - restarted 03/06/2018 - Ozempic 0.5 >> 1 mg weekly -started 04/2018 -no GI side effects.  This is expensive for him, at $180, but he tells me that is not completely unaffordable. He was on Januvia 50 mg daily, but this did not help >> stopped 2018. He was on Jardiance.  He checks his sugars once a day: - am: 130-155, 178 >> 141-188 >> 89-130, 145 >> 72, 105-134, 154 (popcorn at night) - 2h after b'fast: n/c - before lunch: n/c >> 111-143, 148 >> 148-181 >> n/c - 2h after lunch: n/c >> 126-183 >> n/c - before dinner:  95, 133-163, 177 >> 111-168, 182 >> n/c - 2h after dinner: n/c >> 139-165, 212 >> 143-210 >> n/c - bedtime: n/c >> 151 >> n/c - nighttime: n/c Lowest sugar was 135 >> 95 >> 111 >> 89  >> 72; it is unclear at which level he has hypoglycemia awareness. Highest sugar  was 256 >> 212 >> 210 >> 145 >> 154.  Glucometer: One Touch Verio  Pt's meals are: - Breakfast: Banana, 2 clementines, different fruit in season, low-salt chips - Lunch: Meat, 2 veggies, toast, and sweet tea - Dinner: Sandwich (banana or meat, tomato), 1 Clementine - Snacks: Frequently after dinner He exercises on the recumbent bike  -+ CKD, last BUN/creatinine:  Lab Results  Component Value Date   BUN 45 (H) 09/02/2019   BUN 40 (H) 07/24/2019   CREATININE 1.80 (H) 09/02/2019   CREATININE 1.74 (H) 07/24/2019   -+ HL; last set of lipids: Lab Results  Component Value Date   CHOL 118 07/24/2019   HDL 23 (L) 07/24/2019   LDLCALC 55 07/24/2019   LDLDIRECT 61.5 05/15/2013   TRIG 251 (H) 07/24/2019   CHOLHDL 5.0 07/04/2018  On Lipitor 80.  His cardiologist also recommended Vascepa - samples for 6 weeks -but he could not afford this.  He is taking fish oil.  - last eye exam was in 11/2018 reportedly: No DR. Small cataracts.  - no numbness and tingling in his feet.  Pt has FH of DM in S, B, Father.  He also has HTN, OSA.  ROS: Constitutional: no weight gain/no weight loss, no fatigue, no subjective hyperthermia, no subjective hypothermia Eyes: no blurry vision, no xerophthalmia ENT: no sore throat, no nodules  palpated in neck, no dysphagia, no odynophagia, no hoarseness Cardiovascular: no CP/no SOB/no palpitations/no leg swelling Respiratory: no cough/no SOB/no wheezing Gastrointestinal: no N/no V/no D/no C/no acid reflux Musculoskeletal: no muscle aches/no joint aches Skin: no rashes, no hair loss Neurological: no tremors/no numbness/no tingling/no dizziness  I reviewed pt's medications, allergies, PMH, social hx, family hx, and changes were documented in the history of present illness. Otherwise, unchanged from my initial visit note.  Past Medical History:  Diagnosis Date  . AAA (abdominal aortic aneurysm) ([place])    5/14 CT showed > 6 cm AAA, also right iliac aneurysm.  Not stent graft candidate.   . Anemia of chronic disease   . Anxiety    pt. admits that he has anxiety at times   . Aortic stenosis    Moderate to severe by echo in 4/14. Bioprosthetic #21 [facility] Ease aortic valve replacement in 4/14.   . Arthritis   . CAD (coronary artery disease)    a. 4/14 CABG: LIMA-LAD, seq SVG-ramus & OM1, SVG-PDA  . Carotid arterial disease (HCC)    Carotid dopplers (5/17) with 00-17% LICA stenosis.   . CHF (congestive heart failure) ([place]) 07/2012; 10/17/2012  . Chronic kidney disease    increased creatinine recently - being followed, near kidney failure fr. contrast dye   . Chronic systolic heart failure (HCC)    a. 1/14 ECHO: sev dil LV, EF30-35%, diff HK, mild MR, AS severe, AV grad 35 b. 8/14 ECHO: EF 55-60%, mild biopros AV sten mn grad. 25, RV mild dil, RA mild dil  . Complication of anesthesia    during last surgery had to be given special medicine b/c "something dropped" during surgery   . COPD (chronic obstructive pulmonary disease) ([place])    a.  History of heavy smoking. PFTs (4/14) with mixed obstructive (COPD) and restrictive (post-ARDS) picture.   . Diabetes mellitus without complication ([place])   . Esophageal reflux   . History of blood transfusion    "lots since January" (10/17/2012)  . Hyperlipidemia   . Hypertension   . Ischemic cardiomyopathy   . Ischemic cardiomyopathy    a. 4/14 LHC: pLAD 95, ost ramus 70-80, mLCx 90, EF 30%  . OSA (obstructive sleep apnea)    on cpap- every sleep time.   . Pneumonia 07/2012   PNA (H1N1 influenza + pneumococcus) in 4/94 complicated by respiratory failure/ARDS. Required tracheostomy, now weaned off. Had left empyema requiring chest tube.   . Prediabetes   . Prostate cancer [facility])     s/p radiation treatment. - (PT. DENIES)Has indwelling foley.   . Shortness of breath    still goes deer hunting by himself   Past Surgical History:  Procedure Laterality Date  . ABDOMINAL AORTIC ANEURYSM REPAIR N/A  07/10/2013   Procedure: Resection and Graftiong of perirenal AAA; Insertion 14 x 8 Hemashield Graft Aorta to Left Common Iliac and to Right Common Femoral Artery With Ligastion of Right External and Interanl Iliac Artery;  Surgeon: Mal Misty, MD;  Location: [place];  Service: Vascular;  Laterality: N/A;  . ANAL FISSURE REPAIR  2008  . AORTIC VALVE REPLACEMENT N/A 10/25/2012   Procedure: AORTIC VALVE REPLACEMENT (AVR);  Surgeon: Melrose Nakayama, MD;  Location: [place];  Service: Open Heart Surgery;  Laterality: N/A;  . CARDIAC VALVE REPLACEMENT    . CORONARY ARTERY BYPASS GRAFT N/A 10/25/2012   Procedure: CORONARY ARTERY BYPASS GRAFTING (CABG);  Surgeon: Melrose Nakayama, MD;  Location: [place];  Service: Open Heart Surgery;  Laterality: N/A;  times 4 using left internal mammary artery and endoscopically harvested bilateral saphenous vein   . EP IMPLANTABLE DEVICE N/A 09/22/2015   Procedure: BiV Upgrade;  Surgeon: Deboraha Sprang, MD;  Location: [place] CV LAB;  Service: Cardiovascular;  Laterality: N/A;  . INTRAOPERATIVE TRANSESOPHAGEAL ECHOCARDIOGRAM N/A 10/25/2012   Procedure: INTRAOPERATIVE TRANSESOPHAGEAL ECHOCARDIOGRAM;  Surgeon: Melrose Nakayama, MD;  Location: [place];  Service: Open Heart Surgery;  Laterality: N/A;  . LEFT AND RIGHT HEART CATHETERIZATION WITH CORONARY ANGIOGRAM N/A 10/22/2012   Procedure: LEFT AND RIGHT HEART CATHETERIZATION WITH CORONARY ANGIOGRAM;  Surgeon: Larey Dresser, MD;  Location: [facility] CATH LAB;  Service: Cardiovascular;  Laterality: N/A;  . NASAL FRACTURE SURGERY  1970's  . PACEMAKER REVISION  09/22/2015   pacemaker upgrade   . PERMANENT PACEMAKER INSERTION N/A 07/15/2014   Procedure: PERMANENT PACEMAKER INSERTION;  Surgeon: Deboraha Sprang, MD;  Location: [facility] CATH LAB;  Service: Cardiovascular;  Laterality: N/A;  . TRACHEOSTOMY  07/2012  . TRACHEOSTOMY CLOSURE  08/2012  . TRANSURETHRAL MICROWAVE THERAPY  10/15/2012   Social History   Socioeconomic History   . Marital status: Married    Spouse name: Not on file  . Number of children:  2  . Years of education: Not on file  . Highest education level: Not on file  Occupational History  .  Retired  Scientific laboratory technician  . Financial resource strain: Not on file  . Food insecurity:    Worry: Not on file    Inability: Not on file  . Transportation needs:    Medical: Not on file    Non-medical: Not on file  Tobacco Use  . Smoking status: Former Smoker    Packs/day: 2.00    Years: 58.00    Pack years: 116.00    Types: Cigarettes    Last attempt to quit: 07/20/2012    Years since quitting: 5.7  . Smokeless tobacco: Never Used  Substance and Sexual Activity  . Alcohol use: No    Alcohol/week:  Beer once or twice a year: 2 drinks    Comment: 10/17/2012 "years since I've had a drink; never had problem with it"  . Drug use: No  . Sexual activity: Not Currently   Current Outpatient Medications on File Prior to Visit  Medication Sig Dispense Refill  . allopurinol (ZYLOPRIM) 300 MG tablet Take 300 mg by mouth daily.    Marland Kitchen amoxicillin (AMOXIL) 500 MG capsule TAKE 4 CAPS 1 HOUR PRIOR TO TREATMENT    . aspirin EC 81 MG tablet Take 81 mg by mouth daily.    Marland Kitchen atorvastatin (LIPITOR) 80 MG tablet TAKE 1 TABLET (80 MG TOTAL) BY MOUTH DAILY BEFORE BREAKFAST. 90 tablet 3  . bacitracin ointment Apply topically 2 (two) times daily. Apply to affected area 120 g 0  . bisoprolol (ZEBETA) 5 MG tablet TAKE 1/2 TABLETS (2.5 MG TOTAL) BY MOUTH DAILY. 45 tablet 3  . calcitRIOL (ROCALTROL) 0.25 MCG capsule Take 0.25 mcg by mouth daily.     Marland Kitchen dextromethorphan-guaiFENesin (MUCINEX DM) 30-600 MG 12hr tablet Take 1 tablet by mouth 2 (two) times daily.    . fexofenadine (ALLEGRA) 180 MG tablet Take 90 mg by mouth daily.     Marland Kitchen glucose blood (ONETOUCH VERIO) test strip Use to check blood sugar once a day 100 each 12  . Insulin Degludec (TRESIBA FLEXTOUCH) 200 UNIT/ML SOPN Inject 34 Units into the skin daily. 6 pen 5  .  Insulin  Pen Needle 31G X 4 MM MISC 1 each by Does not apply route daily. Use to inject insulin daily 100 each 2  . ipratropium-albuterol (DUONEB) 0.5-2.5 (3) MG/3ML SOLN Take 3 mLs by nebulization every 6 (six) hours as needed (for shortness of breath).    . Omega-3 Fatty Acids (FISH OIL MAXIMUM STRENGTH PO) Take 1 capsule by mouth daily.     Marland Kitchen OZEMPIC, 1 MG/DOSE, 2 MG/1.5ML SOPN INJECT 1 MG INTO THE SKIN ONCE A WEEK. 9 pen 1  . pantoprazole (PROTONIX) 40 MG tablet Take 40 mg by mouth at bedtime.     . Potassium Chloride (KLOR-CON 10 PO) Take by mouth daily.    Marland Kitchen torsemide (DEMADEX) 20 MG tablet Take 2 tablets (40 mg total) by mouth 2 (two) times daily. 360 tablet 3   No current facility-administered medications on file prior to visit.   Allergies  Allergen Reactions  . Iodinated Diagnostic Agents Other (See Comments)    Decreased kidney function  . Omnipaque [Iohexol] Other (See Comments)    Decreased kidney function  . Primaxin [Imipenem] Hives  . Cephalosporins Rash    Blisters   . Lisinopril Nausea And Vomiting    Drops blood pressure   . Clindamycin/Lincomycin Swelling and Rash   Family History  Problem Relation Age of Onset  . Cancer Mother   . Heart disease Father   . Congestive Heart Failure Other   . Heart disease Other    Also,  hyperlipidemia, heart disease, hypertension and brother. Thyroid disease and cancer in mother.  PE: BP 120/60   Pulse 86   Ht 5\' 5"  (1.651 m)   Wt 227 lb (103 kg)   SpO2 95%   BMI 37.77 kg/m  Wt Readings from Last 3 Encounters:  10/30/19 227 lb (103 kg)  09/02/19 228 lb 3.2 oz (103.5 kg)  06/30/19 232 lb (105.2 kg)   Constitutional: overweight, in NAD Eyes: PERRLA, EOMI, no exophthalmos ENT: moist mucous membranes, no thyromegaly, no cervical lymphadenopathy Cardiovascular: RRR, No MRG Respiratory: CTA B Gastrointestinal: abdomen soft, NT, ND, BS+ Musculoskeletal: no deformities, strength intact in all 4 Skin: moist, warm, no  rashes Neurological: no tremor with outstretched hands, DTR normal in all 4  ASSESSMENT: 1. 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 HL  3.  Obesity class II  PLAN:  1. Patient with longstanding, previously uncontrolled type 2 diabetes on basal insulin and weekly GLP-1 receptor agonist.  We could not add an SGLT2 inhibitor due to the poor kidney function.  This would have been ideal in the setting of his CHF. -At last visit, sugars were slightly higher after his COVID-19 infection as he was not exercising.  At that time, his long-acting insulin dose was increased.  Sugars started to improve right before I saw him last.  He was planning to restart using his recumbent bike.  The reduced the dose of his [place] at that time. - at this visit, sugars are mostly at goal with only one exception when he had popcorn at night.  However, he is only checking in the morning and I strongly advised him to check later in the day, also. - For now, I do not feel we need to change his regimen.  At next visit, depending on his sugars we may be able to reduce [place] further. - I suggested  to:  Patient Instructions  Please continue: - Tresiba U200 34 units daily - Ozempic 1 mg weekly  Please return in 4 months with your sugar log.   - we checked his HbA1c: 6.4% (lightly higher) - advised to check sugars at different times of the day - 1x a day, rotating check times - advised for yearly eye exams >> he is UTD and has an appointment coming up next month - return to clinic in 4 months  2. HL -Reviewed latest lipid panel from 07/2019: LDL at goal, triglycerides high, HDL low: Lab Results  Component Value Date   CHOL 118 07/24/2019   HDL 23 (L) 07/24/2019   LDLCALC 55 07/24/2019   LDLDIRECT 61.5 05/15/2013   TRIG 251 (H) 07/24/2019   CHOLHDL 5.0 07/04/2018  -Continues  Lipitor and fish oil without side effects  3.  Obesity class 2 -We will continue Ozempic which should also help with weight loss -Continues to exercise on the recumbent bike -He lost 4 pounds before last visit and 5 more since then  Philemon Kingdom, MD PhD [facility] Endocrinology

## 2019-10-30 NOTE — Patient Instructions (Signed)
Please continue: - Tresiba U200 34 units daily - Ozempic 1 mg weekly  Please return in 4 months with your sugar log.

## 2019-10-30 NOTE — Addendum Note (Signed)
Addended by: Cardell Peach I on: 10/30/2019 01:17 PM   Modules accepted: Orders

## 2019-11-07 ENCOUNTER — Telehealth: Payer: Self-pay

## 2019-11-07 ENCOUNTER — Ambulatory Visit (INDEPENDENT_AMBULATORY_CARE_PROVIDER_SITE_OTHER): Payer: Medicare Other | Admitting: *Deleted

## 2019-11-07 DIAGNOSIS — I442 Atrioventricular block, complete: Secondary | ICD-10-CM | POA: Diagnosis not present

## 2019-11-07 NOTE — Telephone Encounter (Signed)
Spoke with patient to remind of missed remote transmission 

## 2019-11-08 LAB — CUP PACEART REMOTE DEVICE CHECK
Battery Remaining Longevity: 88 mo
Battery Remaining Percentage: 89 %
Battery Voltage: 2.95 V
Brady Statistic AP VP Percent: 1 %
Brady Statistic AP VS Percent: 1 %
Brady Statistic AS VP Percent: 98 %
Brady Statistic AS VS Percent: 1 %
Brady Statistic RA Percent Paced: 1 %
Date Time Interrogation Session: 20210507152559
Implantable Lead Implant Date: 20160113
Implantable Lead Implant Date: 20160113
Implantable Lead Implant Date: 20170322
Implantable Lead Location: 753858
Implantable Lead Location: 753859
Implantable Lead Location: 753860
Implantable Lead Model: 5076
Implantable Lead Model: 5076
Implantable Pulse Generator Implant Date: 20170322
Lead Channel Impedance Value: 450 Ohm
Lead Channel Impedance Value: 530 Ohm
Lead Channel Impedance Value: 680 Ohm
Lead Channel Pacing Threshold Amplitude: 0.5 V
Lead Channel Pacing Threshold Amplitude: 0.625 V
Lead Channel Pacing Threshold Amplitude: 0.75 V
Lead Channel Pacing Threshold Pulse Width: 0.4 ms
Lead Channel Pacing Threshold Pulse Width: 0.4 ms
Lead Channel Pacing Threshold Pulse Width: 0.4 ms
Lead Channel Sensing Intrinsic Amplitude: 12 mV
Lead Channel Sensing Intrinsic Amplitude: 3.5 mV
Lead Channel Setting Pacing Amplitude: 1.625
Lead Channel Setting Pacing Amplitude: 2 V
Lead Channel Setting Pacing Amplitude: 2.5 V
Lead Channel Setting Pacing Pulse Width: 0.4 ms
Lead Channel Setting Pacing Pulse Width: 0.4 ms
Lead Channel Setting Sensing Sensitivity: 5 mV
Pulse Gen Model: 3262
Pulse Gen Serial Number: 7838612

## 2019-11-10 NOTE — Progress Notes (Signed)
Remote pacemaker transmission.   

## 2019-12-03 ENCOUNTER — Telehealth: Payer: Self-pay | Admitting: Pulmonary Disease

## 2019-12-03 ENCOUNTER — Encounter (HOSPITAL_COMMUNITY): Payer: Self-pay | Admitting: Cardiology

## 2019-12-03 ENCOUNTER — Other Ambulatory Visit: Payer: Self-pay

## 2019-12-03 ENCOUNTER — Ambulatory Visit (HOSPITAL_COMMUNITY)
Admission: RE | Admit: 2019-12-03 | Discharge: 2019-12-03 | Disposition: A | Discharge status: Home / Self Care | Payer: Medicare Other | Source: Ambulatory Visit | Attending: Cardiology | Admitting: Cardiology

## 2019-12-03 VITALS — BP 116/68 | HR 92 | Wt 228.2 lb

## 2019-12-03 DIAGNOSIS — I714 Abdominal aortic aneurysm, without rupture: Secondary | ICD-10-CM | POA: Insufficient documentation

## 2019-12-03 DIAGNOSIS — Z87891 Personal history of nicotine dependence: Secondary | ICD-10-CM

## 2019-12-03 DIAGNOSIS — I5022 Chronic systolic (congestive) heart failure: Secondary | ICD-10-CM | POA: Insufficient documentation

## 2019-12-03 DIAGNOSIS — Z7982 Long term (current) use of aspirin: Secondary | ICD-10-CM | POA: Diagnosis not present

## 2019-12-03 DIAGNOSIS — Z953 Presence of xenogenic heart valve: Secondary | ICD-10-CM | POA: Insufficient documentation

## 2019-12-03 DIAGNOSIS — N183 Chronic kidney disease, stage 3 unspecified: Secondary | ICD-10-CM | POA: Diagnosis not present

## 2019-12-03 DIAGNOSIS — E785 Hyperlipidemia, unspecified: Secondary | ICD-10-CM | POA: Diagnosis not present

## 2019-12-03 DIAGNOSIS — I2510 Atherosclerotic heart disease of native coronary artery without angina pectoris: Secondary | ICD-10-CM

## 2019-12-03 DIAGNOSIS — J449 Chronic obstructive pulmonary disease, unspecified: Secondary | ICD-10-CM | POA: Insufficient documentation

## 2019-12-03 DIAGNOSIS — Z79899 Other long term (current) drug therapy: Secondary | ICD-10-CM | POA: Insufficient documentation

## 2019-12-03 DIAGNOSIS — Z794 Long term (current) use of insulin: Secondary | ICD-10-CM | POA: Diagnosis not present

## 2019-12-03 DIAGNOSIS — I5032 Chronic diastolic (congestive) heart failure: Secondary | ICD-10-CM | POA: Diagnosis not present

## 2019-12-03 DIAGNOSIS — Z950 Presence of cardiac pacemaker: Secondary | ICD-10-CM | POA: Insufficient documentation

## 2019-12-03 DIAGNOSIS — Z951 Presence of aortocoronary bypass graft: Secondary | ICD-10-CM | POA: Diagnosis not present

## 2019-12-03 DIAGNOSIS — L905 Scar conditions and fibrosis of skin: Secondary | ICD-10-CM | POA: Diagnosis not present

## 2019-12-03 DIAGNOSIS — I441 Atrioventricular block, second degree: Secondary | ICD-10-CM | POA: Diagnosis not present

## 2019-12-03 DIAGNOSIS — E1122 Type 2 diabetes mellitus with diabetic chronic kidney disease: Secondary | ICD-10-CM | POA: Insufficient documentation

## 2019-12-03 DIAGNOSIS — M545 Low back pain: Secondary | ICD-10-CM | POA: Diagnosis not present

## 2019-12-03 DIAGNOSIS — J8410 Pulmonary fibrosis, unspecified: Secondary | ICD-10-CM | POA: Diagnosis not present

## 2019-12-03 DIAGNOSIS — G4733 Obstructive sleep apnea (adult) (pediatric): Secondary | ICD-10-CM | POA: Insufficient documentation

## 2019-12-03 DIAGNOSIS — I350 Nonrheumatic aortic (valve) stenosis: Secondary | ICD-10-CM | POA: Insufficient documentation

## 2019-12-03 DIAGNOSIS — I255 Ischemic cardiomyopathy: Secondary | ICD-10-CM | POA: Insufficient documentation

## 2019-12-03 DIAGNOSIS — I130 Hypertensive heart and chronic kidney disease with heart failure and stage 1 through stage 4 chronic kidney disease, or unspecified chronic kidney disease: Secondary | ICD-10-CM | POA: Insufficient documentation

## 2019-12-03 DIAGNOSIS — M109 Gout, unspecified: Secondary | ICD-10-CM | POA: Insufficient documentation

## 2019-12-03 DIAGNOSIS — Z8619 Personal history of other infectious and parasitic diseases: Secondary | ICD-10-CM | POA: Diagnosis not present

## 2019-12-03 LAB — BASIC METABOLIC PANEL
Anion gap: 10 (ref 5–15)
BUN: 41 mg/dL — ABNORMAL HIGH (ref 8–23)
CO2: 31 mmol/L (ref 22–32)
Calcium: 9.9 mg/dL (ref 8.9–10.3)
Chloride: 101 mmol/L (ref 98–111)
Creatinine, Ser: 1.68 mg/dL — ABNORMAL HIGH (ref 0.61–1.24)
GFR calc Af Amer: 45 mL/min — ABNORMAL LOW (ref >60)
GFR calc non Af Amer: 39 mL/min — ABNORMAL LOW (ref >60)
Glucose, Bld: 115 mg/dL — ABNORMAL HIGH (ref 70–99)
Potassium: 4 mmol/L (ref 3.5–5.1)
Sodium: 142 mmol/L (ref 135–145)

## 2019-12-03 NOTE — Telephone Encounter (Signed)
There is not an order for [facility] CT to be scheduled. I will ask Langley Gauss to put in a new order

## 2019-12-03 NOTE — Patient Instructions (Signed)
No medication changes!  Labs today  We will only contact you if something comes back abnormal or we need to make some changes. Otherwise no news is good news!  We will send a message to Dr Angus Palms office to reach out to you for an appointment.   Your physician recommends that you schedule a follow-up appointment in: 3 months with Dr [name]   Please call office at [phone] option 2 if you have any questions or concerns.    At the [facility], you and your health needs are our priority. As part of our continuing mission to provide you with exceptional heart care, we have created designated Provider Care Teams. These Care Teams include your primary Cardiologist (physician) and Advanced Practice Providers (APPs- Physician Assistants and Nurse Practitioners) who all work together to provide you with the care you need, when you need it.   You may see any of the following providers on your designated Care Team at your next follow up: Marland Kitchen Dr Glori Bickers . Dr Loralie Champagne . Darrick Grinder, NP . Lyda Jester, PA . Audry Riles, PharmD   Please be sure to bring in all your medications bottles to every appointment.

## 2019-12-03 NOTE — Progress Notes (Signed)
Called Dr Angus Palms office to arrange f/u. Date provided was 7/6 11:45am. Called patient to let them know however that date does not work for them.  Number provided for them to call and get a date that works best for them. Voiced appreciation.

## 2019-12-04 NOTE — Progress Notes (Signed)
ID:  [name], DOB 12-Jun-1941, MRN [id number]   Provider location: [facility] Type of Visit: Established patient   PCP:  [name], MD  HF Cardiologist:  Dr. [name]   History of Present Illness: [name] is a 79 y.o. male who has a history of ARDS/respiratory failure and tracheostomy in 07/2012 as well as DM, HTN, and COPD presented to the ER at [place] with CHF in 10/2012. Patient had a prolonged hospitalization in 07/2012 with H1N1 influenza and pneumococcal PNA. This progressed to ARDS. He was intubated and ended up with tracheostomy. He had the trach removed. He also had a left empyema requiring chest tube, septic shock with elevated troponin, and AKI which resolved.  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   He was admitted again in 10/2012 from his nursing home with exertional dyspnea and orthopnea. He had gained 21 lbs. Echo at admission showed EF 35% with global hypokinesis that looked worse in the anteroseptal wall. He also was noted to have aortic stenosis rated as moderate to severe. He was diuresed and cathed, showing severe 3 vessel disease. He then had CABG-AVR (bioprosthetic valve).   He had a large AAA and underwent surgical repair in 07/2013.  He had AKI and Pseudomonas PNA post-operatively.  He was discharged to a nursing home. Echo in 1/15 showed EF 55-60% with mildly dilated and dysfunctional RV and a bioprosthetic aortic valve with mean gradient 32 mmHg (higher than expected).   Repeat limited echo in 08/2013 showed shower aortic valve mean gradient of 21 mmHg.   In 11/15, patient had a holter monitor placed for bradycardia.  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  Baseline bifascicular block.  His HR also was noted to drop into the 40s with exercise, suggesting exercise-associated heart block.  He underwent Medtronic-PCM placement in 1/16 by Dr Caryl Comes.   Echo in 10/16 showed fall in EF to 30% with mean aortic valve  gradient 21 mmHg.  Repeat echo in 2/17 showed EF 20-25%, mean aortic valve gradient 24 mmHg with moderately decreased RV systolic function.  Cardiolite in 10/16 showed inferior scar with no ischemia.  He is RV pacing almost all the time.  Low dose DSE was done in 2/17 to assess for significant bioprosthetic valve stenosis => mean gradient with dobutamine was only 31 mmHg, so suspect no more than moderate stenosis.  He had St Jude CRT upgrade in 3/17.  He decided against ICD.  Repeat echo post-CRT in 8/17 showed EF 50%, diffuse hypokinesis, bioprosthetic aortic valve with mean gradient 27 mmHg.  Echo in 9/18 showed EF 50-55%, bioprosthetic aortic valve with mean gradient 25 mmHg.  Echo in 10/19 showed EF 50-55%, bioprosthetic aortic valve with mean gradient 25 mmHg.   In 9/20, he was admitted with COVID-19 infection.    Echo in 3/21 showed EF 50-55% with mild LVH, mild RV enlargement with normal function, PASP 25, bioprosthetic aortic valve with mean gradient 24 and AVA 1.38 cm^2.     He is doing well symptomatically.  Able to go shopping in grocery store without significant dyspnea though he does fatigue easily.  Not very active, however, has not started going back to his gym.  Main complaint is low back pain.  No orthopnea/PND.  No claudication, no chest pain. No lightheadedness. He has a chronic cough.    St Jude device interrogation: Thoracic impedance stable, 98% BiV pacing  ECG (personally reviewed): NSR, BiV pacing  Labs (  2/15): K 4, creatinine 1.6, BNP 166 Labs (3/15): Hgb 11.6, pro-BNP 1037, creatinine 1.76, BUN 30, LDL 75, HDL 26 Labs (8/15): K 3.9, creatinine 1.55, HCT 40.6, LDL 71, HDL 27 Labs (1/16): K 4.3, Creatinine 1.6, HCT 42.9 Labs (2/17): K 4.0 Creatinine 1.64, TSH normal.  Labs (3/17): K 4.5, creatinine 1.49, HCT 42.9 Labs (4/17): K 4, creatinine 1.48, BNP 125 Labs (6/17): LDL 27, HDL 24, TGs 245 Labs (7/17): K 4.9, creatinine 1.71 Labs (8/17): K 4.3, creatinine 1.7, LDL 64,  HDl 25, TGs 198 Lab (06/16/2016): K 4.9 Creatinine 1.84  Labs (10/2016): Creatinine 1.5 K 3.9  Labs (6/18): K 4.4, creatinine 1.57, BNP 55 Labs (7/18): LDL 58, HDL 22, creatinine 1.64 Labs (11/19): K 3.9, creatinine 1.5  Labs (9/20): K 4, creatinine 1.62, hgb 15.5 Labs (2/21): LDL 52, HDL 24, TGs 202, hgb 14.2, K 3.8, creatinine 1.6 Labs (3/21): K 3.5, creatinine 1.8  PMH: 1. PNA (H1N1 influenza + pneumococcus) in 1/19 complicated by respiratory failure/ARDS. Required tracheostomy, now weaned off. Had left empyema requiring chest tube.  Has developed post-infectious pulmonary fibrosis.  2. Prostate CA s/p radiation treatment. Has indwelling foley.  3. OSA: using CPAP 4. HTN  5. Hyperlipidemia  6. COPD: History of heavy smoking. PFTs (4/14) with mixed obstructive (COPD) and restrictive (post-ARDS) picture.  - CT chest 12/19 consistent with emphysema.  7. Type II diabetes  8. Anemia of chronic disease.  9. Ischemic cardiomyopathy:  - Echo (1/14) with severely dilated LV, EF 30-35%, diffuse hypokinesis worse in the anteroseptal wall, grade II diastolic dysfunction, mild MR, AS interpreted as "moderate to severe" with aortic valve mean gradient 23 mmHg.  - Echo (4/14) witih EF 35-40%, mid to apical anteroseptal akinesis, moderate to severe AS with mean gradient 33 mmHg, AVA 0.91 cm^2.  -Echo (5/14) post CABG showed EF 50%, anteroseptal hypokinesis, bioprosthetic aortic valve well-seated. - Echo (1/15) with EF 55-60%, mild LVH, mildly dilated RV with mildly decreased systolic function, D-shaped interventricular septum, bioprosthetic aortic valve with mean gardient 32 mmHg.   - Echo (3/15) with EF 55-60%, mild LVH, bioprosthetic aortic valve with mean gradient 21 mmHg (lower), no AI, RV moderately dilated with mild to moderately decreased systolic function.   - Echo (10/16) with EF 30%, bioprosthetic aortic valve with mean gradient 21 mmHg.   - Echo (2/17) with EF 20-25%, mild LVH, aortic valve  mean gradient 24 mmHg/peak 39 mmHg, RV moderately dilated with moderately decreased systolic function.  - St Jude CRT upgrade in 3/17 (did not want ICD).  - Echo (8/17) with EF 50%, diffuse hypokinesis, bioprosthetic aortic valve with mean gradient 27 mmHg, normal RV size with mildly decreased systolic function.   - AKI with lisinopril.  - Echo (9/18): EF 50-55%, bioprosthetic aortic valve with mean gradient 25 mmHg.  - Echo (10/19): EF 50-55%, bioprosthetic aortic valve with mean gradient 25 mmHg, normal RV size and systolic function.  - Echo (3/21): EF 50-55% with mild LVH, mild RV enlargement with normal function, PASP 25, bioprosthetic aortic valve with mean gradient 24 and AVA 1.38 cm^2.    10. Aortic stenosis: Moderate to severe by echo in 4/14. Bioprosthetic #21 [facility] Ease aortic valve replacement in 4/14.  Low dose DSE (2/17) showed that Mr Lacivita likely has no more than moderate bioprosthetic valve stenosis with mean gradient up to 31 mmHg with dobutamine.  11. CAD: LHC (4/14) with 95% pLAD, 70-80% ostial ramus, 70% mLCx, 90% pRCA, EF 35%.  - CABG 4/14  with LIMA-LAD, seq SVG-ramus and OM1, SVG-PDA.  - Cardiolite (10/16) with EF 42%, fixed inferior defect, no ischemia.   12. Carotid stenosis: Carotid dopplers (0/96) with 04-54% LICA stenosis.  Carotids (0/98) with 11-91% LICA stenosis. Carotids (4/78) with 29-56% LICA stenosis.  - Carotid dopplers (2/13): 08-65% LICA stenosis.  - Carotid dopplers (7/84): 69-62% LICA stenosis.  13. AAA: 5/14 CT showed > 6 cm AAA, also right iliac aneurysm. Not stent graft candidate.  7/14 CT showed 7 cm AAA.  Surgical repair 1/15.  14. CKD: AKI in 7/14 from contrast nephropathy.  AKI in 1/15 after AAA repair.  15. Contrast allergy.  16. ABIs 9/14 were normal.  17. Gout 18. 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  HR drops with exercise (likely exercise-induced heart block).  Medtronic dual chamber PPM placed in 2016.   Recent pacemaker interrogation showed complete heart block.  He is pacemaker-dependent, upgraded to CRT in 3/17.  19. COVID-19 PNA in 9/20  SH: Quit smoking in 1/14, married, no ETOH, lives in [place].   FH: No premature CAD  ROS: All systems reviewed and negative except as per HPI.    Current Outpatient Medications  Medication Sig Dispense Refill  . allopurinol (ZYLOPRIM) 300 MG tablet Take 300 mg by mouth daily.    Marland Kitchen amoxicillin (AMOXIL) 500 MG capsule TAKE 4 CAPS 1 HOUR PRIOR TO TREATMENT    . aspirin EC 81 MG tablet Take 81 mg by mouth daily.    Marland Kitchen atorvastatin (LIPITOR) 80 MG tablet TAKE 1 TABLET (80 MG TOTAL) BY MOUTH DAILY BEFORE BREAKFAST. 90 tablet 3  . bacitracin ointment Apply topically 2 (two) times daily. Apply to affected area 120 g 0  . bisoprolol (ZEBETA) 5 MG tablet TAKE 1/2 TABLETS (2.5 MG TOTAL) BY MOUTH DAILY. 45 tablet 3  . calcitRIOL (ROCALTROL) 0.25 MCG capsule Take 0.25 mcg by mouth daily.     Marland Kitchen dextromethorphan-guaiFENesin (MUCINEX DM) 30-600 MG 12hr tablet Take 1 tablet by mouth 2 (two) times daily.    . fexofenadine (ALLEGRA) 180 MG tablet Take 90 mg by mouth daily.     Marland Kitchen glucose blood (ONETOUCH VERIO) test strip Use to check blood sugar once a day 100 each 12  . Insulin Degludec (TRESIBA FLEXTOUCH) 200 UNIT/ML SOPN Inject 34 Units into the skin daily. 6 pen 5  . Insulin Pen Needle 31G X 4 MM MISC 1 each by Does not apply route daily. Use to inject insulin daily 100 each 2  . ipratropium-albuterol (DUONEB) 0.5-2.5 (3) MG/3ML SOLN Take 3 mLs by nebulization every 6 (six) hours as needed (for shortness of breath).    . Omega-3 Fatty Acids (FISH OIL MAXIMUM STRENGTH PO) Take 1 capsule by mouth in the morning and at bedtime.     . pantoprazole (PROTONIX) 40 MG tablet Take 40 mg by mouth at bedtime.     . Potassium Chloride (KLOR-CON 10 PO) Take by mouth daily.    . Semaglutide, 1 MG/DOSE, (OZEMPIC, 1 MG/DOSE,) 4 MG/3ML SOPN Inject 1 mg into the skin once a week. 9 mL  3  . torsemide (DEMADEX) 20 MG tablet Take 2 tablets (40 mg total) by mouth 2 (two) times daily. 360 tablet 3   No current facility-administered medications for this encounter.    Vitals:   12/03/19 1221  BP: 116/68  Pulse: 92  SpO2: 92%  Weight: 103.5 kg (228 lb 3.2 oz)    Wt Readings from Last 3 Encounters:  12/03/19 103.5 kg (228 lb 3.2 oz)  10/30/19 103 kg (227 lb)  09/02/19 103.5 kg (228 lb 3.2 oz)   General: NAD Neck: No JVD, no thyromegaly or thyroid nodule.  Lungs: Clear to auscultation bilaterally with normal respiratory effort. CV: Nondisplaced PMI.  Heart regular S1/S2, no S3/S4, 1/6 SEM RUSB.  Trace ankle edema.  No carotid bruit.  Normal pedal pulses.  Abdomen: Soft, nontender, no hepatosplenomegaly, no distention.  Skin: Intact without lesions or rashes.  Neurologic: Alert and oriented x 3.  Psych: Normal affect. Extremities: No clubbing or cyanosis.  HEENT: Normal.   ASSESSMENT & PLAN: 1. CAD: Status post CABG for 3VD.  Cardiolite in 10/16 with inferior scar, no ischemia.  No chest pain.  - Continue ASA, statin.   2. Chronic systolic CHF:  Echo in 8/82 showed EF down to 20-25%.  No ischemia on Cardiolite.  Constant RV pacing may have led to decline in EF.  We considered bioprosthetic valve stenosis as cause of fall in EF, but low gradient DSE did not suggest severe stenosis.  Echo 8/17 after CRT upgrade shows improved EF to 50%.  Echo in 3/21 showed EF 50-55%.  Weight has been stable.  NYHA class II symptoms, not volume overloaded on exam.   - Continue bisoprolol 2.5 mg daily.   - AKI with lisinopril, will leave off.  - Continue torsemide 40 mg bid.  BMET today.  3. 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 Suspect that intrinsic lung disease (post-infectious pulmonary fibrosis) is a contributor to his dyspnea and hypoxemia. He is now off oxygen (just using CPAP at night).  He had emphysema noted on 12/19 CT.  - Due for pulmonary followup.    4. Carotid stenosis: Followed at [facility]. 5. Hyperlipidemia: Good lipids in 2/21.   6. AAA: s/p surgical repair. 7. Status post bioprosthetic AVR: Gradient across the aortic valve has been elevated. There was some concern for possible low gradient severe bioprosthetic aortic stenosis, but low dose DSE in 2017 suggested that valvular stenosis was no more than moderate.  Mean gradient 24 mmHg across bioprosthetic valve on 3/21 echo, this is stable compared to prior echoes.    - He will need antibiotic prophylaxis with dental work.  - If bioprosthetic aortic valve stenosis worsens, he would be a candidate for TAVR.  8. CKD: Stage III. Check BMET today.  9. 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  S/p Medtronic PCM 1/16.   Patient is pacemaker-dependent with complete heart block.  He had upgrade to CRT in 3/17.   10. OSA: He is on CPAP.  Continue CPAP nightly.  11. Gout: on allopurinol.  Followup in 3 months.   Signed, Loralie Champagne, MD  12/04/2019  [place] [address] [facility] and [place] [address] [phone] (office) [phone] (fax)

## 2019-12-04 NOTE — Telephone Encounter (Signed)
The [facility] CT has been scheduled on 01/19/20 @ 1:30pm at [place] location Patient is aware of the appt and location

## 2019-12-04 NOTE — Telephone Encounter (Signed)
New CT order has been placed.

## 2020-01-06 ENCOUNTER — Ambulatory Visit: Payer: Medicare Other | Admitting: Pulmonary Disease

## 2020-01-19 ENCOUNTER — Encounter: Payer: Self-pay | Admitting: Pulmonary Disease

## 2020-01-19 ENCOUNTER — Ambulatory Visit (INDEPENDENT_AMBULATORY_CARE_PROVIDER_SITE_OTHER): Payer: Medicare Other | Admitting: Pulmonary Disease

## 2020-01-19 ENCOUNTER — Other Ambulatory Visit: Payer: Self-pay

## 2020-01-19 ENCOUNTER — Ambulatory Visit
Admission: RE | Admit: 2020-01-19 | Discharge: 2020-01-19 | Disposition: A | Discharge status: Home / Self Care | Payer: Medicare Other | Source: Ambulatory Visit | Attending: Acute Care | Admitting: Acute Care

## 2020-01-19 DIAGNOSIS — J432 Centrilobular emphysema: Secondary | ICD-10-CM | POA: Diagnosis not present

## 2020-01-19 DIAGNOSIS — G4733 Obstructive sleep apnea (adult) (pediatric): Secondary | ICD-10-CM | POA: Diagnosis not present

## 2020-01-19 DIAGNOSIS — I2510 Atherosclerotic heart disease of native coronary artery without angina pectoris: Secondary | ICD-10-CM

## 2020-01-19 DIAGNOSIS — Z87891 Personal history of nicotine dependence: Secondary | ICD-10-CM

## 2020-01-19 MED ORDER — BREO ELLIPTA 100-25 MCG/INH IN AEPB
1.0000 | INHALATION_SPRAY | Freq: Every day | RESPIRATORY_TRACT | 0 refills | Status: DC
Start: 2020-01-19 — End: 2020-06-29

## 2020-01-19 NOTE — Progress Notes (Signed)
   Subjective:    Patient ID: [name], male    DOB: 09/23/1940, 79 y.o.   MRN: [id number]  HPI   79 yo ex-heavy smoker for FU of COPD &OSA. He smoked 2-3 PPD until he quit in 07/2012   PMH-  CABG-AVR (bioprosthetic valve), DM-2, ischemic cardiomyopathy  hospitalized with Covid pneumonia 03/2019 >> discharged on oxygen.  He has weaned himself off oxygen, breathing has improved On prior visits, he was resistant to starting LABA/LAMA and preferred to stay on duo nebs, which he uses thrice daily, now he admits that he would like a maintenance inhaler. Prefers a generic since he has other medications that are expensive We discussed CT results. No problems with mask or pressure  CPAP download was reviewed which shows good control of events in 13 cm, excellent compliance and large leak    Significant tests/ events reviewed LDCT 01/2020 >> stable nodule, calcified granuloma in the lingula LDCT 06/2018 >> RADS 2, moderate emphysema, right lower lobe 2.6 mm nodule  10/23/12 PFTs reveal severe obstructive / restrictive disease &severely reduced DLCO.   PFT 01/21/13 >FEV1 58%, ratio 58, +25% change after BD , DLCO 38 .  07/2012  H1N1 influenza and pneumococcal PNA/ ARDS requiring tracheostomy. He also had a left empyema requiring chest tube, septic shock and AKI which resolved.    Review of Systems Patient denies significant dyspnea,cough, hemoptysis,  chest pain, palpitations, pedal edema, orthopnea, paroxysmal nocturnal dyspnea, lightheadedness, nausea, vomiting, abdominal or  leg pains      Objective:   Physical Exam  Gen. Pleasant, obese, in no distress ENT - no lesions, no post nasal drip Neck: No JVD, no thyromegaly, no carotid bruits Lungs: no use of accessory muscles, no dullness to percussion, decreased without rales or rhonchi  Cardiovascular: Rhythm regular, heart sounds  normal, no murmurs or gallops, no peripheral edema Musculoskeletal: No deformities, no  cyanosis or clubbing , no tremors        Assessment & Plan:

## 2020-01-19 NOTE — Patient Instructions (Signed)
Trial of Breo 100 - 1 puff daily, rinse mouth after use Call us if this works, & we will get you a generic Rx

## 2020-01-20 ENCOUNTER — Other Ambulatory Visit: Payer: Self-pay

## 2020-01-20 ENCOUNTER — Encounter: Payer: Self-pay | Admitting: Pulmonary Disease

## 2020-01-20 DIAGNOSIS — J432 Centrilobular emphysema: Secondary | ICD-10-CM

## 2020-01-20 NOTE — Progress Notes (Signed)
Please call patient and let them  know their  low dose Ct was read as a Lung RADS 2: nodules that are benign in appearance and behavior with a very low likelihood of becoming a clinically active cancer due to size or lack of growth. Recommendation per radiology is for a repeat LDCT in 12 months. .Please let them  know we will order and schedule their  annual screening scan for 12/2020. Please let them  know there was notation of CAD on their  scan.  Please remind the patient  that this is a non-gated exam therefore degree or severity of disease  cannot be determined. Please have them  follow up with their PCP regarding potential risk factor modification, dietary therapy or pharmacologic therapy if clinically indicated. Pt.  is  currently on statin therapy. Please place order for annual  screening scan for  12/2020 and fax results to PCP. Thanks so much. 

## 2020-01-20 NOTE — Assessment & Plan Note (Signed)
CPAP is working well on 13 cm and has really helped improve his daytime somnolence and fatigue  Weight loss encouraged, compliance with goal of at least 4-6 hrs every night is the expectation. Advised against medications with sedative side effects Cautioned against driving when sleepy - understanding that sleepiness will vary on a day to day basis

## 2020-01-20 NOTE — Assessment & Plan Note (Addendum)
Trial of Breo, would use inhaled steroid given positive bronchodilator response in the past. If this works, we will use generic Advair or Symbicort for prescription.  He has other brand-name medications and prefers a generic.  He will continue to use DuoNebs on an as-needed basis Oxygen can be discontinued We reviewed low-dose CT screening results and would continue annual screening

## 2020-01-26 ENCOUNTER — Other Ambulatory Visit: Payer: Self-pay | Admitting: *Deleted

## 2020-01-26 DIAGNOSIS — Z87891 Personal history of nicotine dependence: Secondary | ICD-10-CM

## 2020-01-28 MED ORDER — BREO ELLIPTA 100-25 MCG/INH IN AEPB: 1.0000 | INHALATION_SPRAY | Freq: Every day | RESPIRATORY_TRACT | 5 refills | Status: DC

## 2020-01-29 NOTE — Telephone Encounter (Signed)
Dr. Elsworth Soho please advise on inhaler for this patient. He states the Memory Dance is working well for him and wanted [place] to send in a generic for him we told him there is not a generic and he stated for [place] to check with you.  Rhys Martini, MD Yesterday (2:45 PM)   Please talk to Dr Elsworth Soho, he is the one who said that there is a genreric with some of the same drugs in it that he could prescribe?   Robbie Louis, RN  [place] Yesterday (1:36 PM)   Mr. Rowlette, I am sorry but there is not a generic for the [facility] inhaler. Please let us now if we can further assist you. Regards   Rhys Martini, MD Yesterday (12:03 PM)   [facility] name brand! I wanted the generic brand? Which [facility]?   Nolon Stalls, Kimber Relic, RN  [place], [place] (10:45 AM)  HF You are very welcome.  Have a great day.   Rhys Martini, MD Yesterday (10:43 AM)   Thank you!   Nolon Stalls, Kimber Relic, RN  [place], [place] (10:01 AM)  HF We are glad the Memory Dance is working well for you.  Your prescription has been sent.  Please let us know if you need anything else.  Have a great day.   Rhys Martini, MD Yesterday (9:44 AM)   [facility] [place]! This seems to be working very well for my breathing issues after using it for the last ten days! Thanks  Jearld Adjutant

## 2020-01-31 NOTE — Telephone Encounter (Signed)
Use generic advair diskus 1puff bid  or HFA 45  Or Wixela

## 2020-02-02 MED ORDER — FLUTICASONE-SALMETEROL 250-50 MCG/DOSE IN AEPB
1.0000 | INHALATION_SPRAY | Freq: Two times a day (BID) | RESPIRATORY_TRACT | 11 refills | Status: DC
Start: 2020-02-02 — End: 2020-03-11

## 2020-02-03 DIAGNOSIS — E119 Type 2 diabetes mellitus without complications: Secondary | ICD-10-CM | POA: Diagnosis not present

## 2020-02-03 DIAGNOSIS — I119 Hypertensive heart disease without heart failure: Secondary | ICD-10-CM | POA: Diagnosis not present

## 2020-02-03 DIAGNOSIS — Z8546 Personal history of malignant neoplasm of prostate: Secondary | ICD-10-CM | POA: Diagnosis not present

## 2020-02-03 DIAGNOSIS — J449 Chronic obstructive pulmonary disease, unspecified: Secondary | ICD-10-CM | POA: Diagnosis not present

## 2020-02-03 DIAGNOSIS — I2510 Atherosclerotic heart disease of native coronary artery without angina pectoris: Secondary | ICD-10-CM | POA: Diagnosis not present

## 2020-02-06 ENCOUNTER — Ambulatory Visit: Payer: Medicare Other

## 2020-02-09 ENCOUNTER — Encounter (INDEPENDENT_AMBULATORY_CARE_PROVIDER_SITE_OTHER): Payer: Self-pay

## 2020-02-09 ENCOUNTER — Telehealth: Payer: Self-pay | Admitting: Internal Medicine

## 2020-02-09 ENCOUNTER — Telehealth: Payer: Self-pay

## 2020-02-09 LAB — CUP PACEART REMOTE DEVICE CHECK
Battery Remaining Longevity: 79 mo
Battery Remaining Percentage: 80 %
Battery Voltage: 2.93 V
Brady Statistic AP VP Percent: 1 %
Brady Statistic AP VS Percent: 1 %
Brady Statistic AS VP Percent: 98 %
Brady Statistic AS VS Percent: 1 %
Brady Statistic RA Percent Paced: 1 %
Date Time Interrogation Session: 20210809122548
Implantable Lead Implant Date: 20160113
Implantable Lead Implant Date: 20160113
Implantable Lead Implant Date: 20170322
Implantable Lead Location: 753858
Implantable Lead Location: 753859
Implantable Lead Location: 753860
Implantable Lead Model: 5076
Implantable Lead Model: 5076
Implantable Pulse Generator Implant Date: 20170322
Lead Channel Impedance Value: 410 Ohm
Lead Channel Impedance Value: 510 Ohm
Lead Channel Impedance Value: 650 Ohm
Lead Channel Pacing Threshold Amplitude: 0.5 V
Lead Channel Pacing Threshold Amplitude: 0.625 V
Lead Channel Pacing Threshold Amplitude: 0.75 V
Lead Channel Pacing Threshold Pulse Width: 0.4 ms
Lead Channel Pacing Threshold Pulse Width: 0.4 ms
Lead Channel Pacing Threshold Pulse Width: 0.4 ms
Lead Channel Sensing Intrinsic Amplitude: 12 mV
Lead Channel Sensing Intrinsic Amplitude: 3.1 mV
Lead Channel Setting Pacing Amplitude: 1.625
Lead Channel Setting Pacing Amplitude: 2 V
Lead Channel Setting Pacing Amplitude: 2.5 V
Lead Channel Setting Pacing Pulse Width: 0.4 ms
Lead Channel Setting Pacing Pulse Width: 0.4 ms
Lead Channel Setting Sensing Sensitivity: 5 mV
Pulse Gen Model: 3262
Pulse Gen Serial Number: 7838612

## 2020-02-09 NOTE — Telephone Encounter (Signed)
The pt needed help sending a manual transmission. Transmission received.

## 2020-02-09 NOTE — Telephone Encounter (Signed)
Patient is calling to see if we have received his remote transmission. Connected call to [facility] in Device

## 2020-03-01 ENCOUNTER — Other Ambulatory Visit: Payer: Self-pay

## 2020-03-01 ENCOUNTER — Ambulatory Visit (INDEPENDENT_AMBULATORY_CARE_PROVIDER_SITE_OTHER): Payer: Medicare Other | Admitting: Internal Medicine

## 2020-03-01 ENCOUNTER — Encounter: Payer: Self-pay | Admitting: Internal Medicine

## 2020-03-01 VITALS — BP 150/80 | HR 90 | Ht 65.0 in | Wt 226.0 lb

## 2020-03-01 DIAGNOSIS — E1159 Type 2 diabetes mellitus with other circulatory complications: Secondary | ICD-10-CM

## 2020-03-01 DIAGNOSIS — E785 Hyperlipidemia, unspecified: Secondary | ICD-10-CM

## 2020-03-01 DIAGNOSIS — E1165 Type 2 diabetes mellitus with hyperglycemia: Secondary | ICD-10-CM | POA: Diagnosis not present

## 2020-03-01 DIAGNOSIS — I2510 Atherosclerotic heart disease of native coronary artery without angina pectoris: Secondary | ICD-10-CM | POA: Diagnosis not present

## 2020-03-01 DIAGNOSIS — Z6835 Body mass index (BMI) 35.0-35.9, adult: Secondary | ICD-10-CM | POA: Diagnosis not present

## 2020-03-01 DIAGNOSIS — E66812 Obesity, class 2: Secondary | ICD-10-CM

## 2020-03-01 LAB — POCT GLYCOSYLATED HEMOGLOBIN (HGB A1C): Hemoglobin A1C: 6.4 % — AB (ref 4.0–5.6)

## 2020-03-01 NOTE — Patient Instructions (Signed)
Please continue: - Tresiba U200 34 units daily - Ozempic 1 mg weekly  Please return in 4 months with your sugar log.

## 2020-03-01 NOTE — Progress Notes (Signed)
Patient ID: [name], male   DOB: August 09, 1940, 79 y.o.   MRN: [id number]   This visit occurred during the SARS-CoV-2 public health emergency.  Safety protocols were in place, including screening questions prior to the visit, additional usage of staff PPE, and extensive cleaning of exam room while observing appropriate contact time as indicated for disinfecting solutions.   HPI: [name] is a 79 y.o.-year-old male, referred by his cardiologist, Dr. 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  Last visit 9 months ago.  Reviewed HbA1c levels: Lab Results  Component Value Date   HGBA1C 6.4 (A) 10/30/2019   HGBA1C 6.2 (A) 06/30/2019   HGBA1C 7.1 (A) 11/18/2018   HGBA1C 6.6 (A) 08/30/2018   HGBA1C 8.3 (A) 04/30/2018   HGBA1C 5.5 10/24/2012   HGBA1C 6.4 (H) 07/24/2012   Pt is on a regimen of: - Tresiba 36 >> 26 >> 30 >> 46 >> 40 >> 34 units daily - restarted 03/06/2018 - Ozempic 0.5 >> 1 mg weekly -started 04/2018 -no GI side effects.  This is expensive for him, at $180, but he tells me that is not completely unaffordable. He was on Januvia 50 mg daily, but this did not help >> stopped 2018. He was on Jardiance.  He checks his sugars once a day: - am: 141-188 >> 89-130, 145 >> 72, 105-134, 154 >> 105-130, 145 - 2h after b'fast: n/c - before lunch: n/c >> 111-143, 148 >> 148-181 >> n/c >> 130, 136 - 2h after lunch: n/c >> 126-183 >> n/c - before dinner:  95, 133-163, 177 >> 111-168, 182 >> n/c >> 95-136 - 2h after dinner: n/c >> 139-165, 212 >> 143-210 >> n/c >> 132, 175 - bedtime: n/c >> 151 >> n/c - nighttime: n/c Lowest sugar was 135 >> 95 >> 111 >> 89  >> 72 >> 70s; it is unclear at which level he has hypoglycemia awareness. Highest sugar was 256 >> 212 >> 210 >> 145 >> 154 >> 175 (grapes).  Glucometer: One Touch Verio  Pt's  meals are: - Breakfast: Banana, 2 clementines, different fruit in season, low-salt chips - Lunch: Meat, 2 veggies, toast, and sweet tea - Dinner: Sandwich (banana or meat, tomato), 1 Clementine - Snacks: Frequently after dinner He continues to exercise on his recumbent bike.  -+ CKD, last BUN/creatinine:  Lab Results  Component Value Date   BUN 41 (H) 12/03/2019   BUN 45 (H) 09/02/2019   CREATININE 1.68 (H) 12/03/2019   CREATININE 1.80 (H) 09/02/2019   -+ HL; last set of lipids: Lab Results  Component Value Date   CHOL 118 07/24/2019   HDL 23 (L) 07/24/2019   LDLCALC 55 07/24/2019   LDLDIRECT 61.5 05/15/2013   TRIG 251 (H) 07/24/2019   CHOLHDL 5.0 07/04/2018  On Lipitor 80.  His cardiologist also recommended Vascepa - samples for 6 weeks -but he could not afford this.  He is taking fish oil.  - last eye exam was in 2021: No DR.  Small cataracts.  - no numbness and tingling in his feet.  Pt has FH of DM in S, B, Father.  He also has HTN, OSA. He had Covid19 03/2019.  ROS: Constitutional: no weight gain/no weight loss, no fatigue, no subjective hyperthermia, no subjective hypothermia Eyes: no blurry vision, no xerophthalmia ENT: no sore throat, no nodules palpated in neck, no  dysphagia, no odynophagia, no hoarseness Cardiovascular: no CP/no SOB/no palpitations/no leg swelling Respiratory: no cough/no SOB/no wheezing Gastrointestinal: no N/no V/no D/no C/no acid reflux Musculoskeletal: no muscle aches/no joint aches Skin: no rashes, no hair loss Neurological: no tremors/no numbness/no tingling/no dizziness  I reviewed pt's medications, allergies, PMH, social hx, family hx, and changes were documented in the history of present illness. Otherwise, unchanged from my initial visit note.  Past Medical History:  Diagnosis Date  . AAA (abdominal aortic aneurysm) ([place])    5/14 CT showed > 6 cm AAA, also right iliac aneurysm. Not stent graft candidate.   . Anemia of chronic  disease   . Anxiety    pt. admits that he has anxiety at times   . Aortic stenosis    Moderate to severe by echo in 4/14. Bioprosthetic #21 [facility] Ease aortic valve replacement in 4/14.   . Arthritis   . CAD (coronary artery disease)    a. 4/14 CABG: LIMA-LAD, seq SVG-ramus & OM1, SVG-PDA  . Carotid arterial disease (HCC)    Carotid dopplers (0/98) with 11-91% LICA stenosis.   . CHF (congestive heart failure) ([place]) 07/2012; 10/17/2012  . Chronic kidney disease    increased creatinine recently - being followed, near kidney failure fr. contrast dye   . Chronic systolic heart failure (HCC)    a. 1/14 ECHO: sev dil LV, EF30-35%, diff HK, mild MR, AS severe, AV grad 35 b. 8/14 ECHO: EF 55-60%, mild biopros AV sten mn grad. 25, RV mild dil, RA mild dil  . Complication of anesthesia    during last surgery had to be given special medicine b/c "something dropped" during surgery   . COPD (chronic obstructive pulmonary disease) ([place])    a.  History of heavy smoking. PFTs (4/14) with mixed obstructive (COPD) and restrictive (post-ARDS) picture.   . Diabetes mellitus without complication ([place])   . Esophageal reflux   . History of blood transfusion    "lots since January" (10/17/2012)  . Hyperlipidemia   . Hypertension   . Ischemic cardiomyopathy   . Ischemic cardiomyopathy    a. 4/14 LHC: pLAD 95, ost ramus 70-80, mLCx 90, EF 30%  . OSA (obstructive sleep apnea)    on cpap- every sleep time.   . Pneumonia 07/2012   PNA (H1N1 influenza + pneumococcus) in 4/78 complicated by respiratory failure/ARDS. Required tracheostomy, now weaned off. Had left empyema requiring chest tube.   . Pneumonia due to COVID-19 virus 03/22/2019  . Prediabetes   . Prostate cancer [facility])     s/p radiation treatment. - (PT. DENIES)Has indwelling foley.   . Shortness of breath    still goes deer hunting by himself   Past Surgical History:  Procedure Laterality Date  . ABDOMINAL AORTIC ANEURYSM REPAIR N/A 07/10/2013    Procedure: Resection and Graftiong of perirenal AAA; Insertion 14 x 8 Hemashield Graft Aorta to Left Common Iliac and to Right Common Femoral Artery With Ligastion of Right External and Interanl Iliac Artery;  Surgeon: Mal Misty, MD;  Location: [place];  Service: Vascular;  Laterality: N/A;  . ANAL FISSURE REPAIR  2008  . AORTIC VALVE REPLACEMENT N/A 10/25/2012   Procedure: AORTIC VALVE REPLACEMENT (AVR);  Surgeon: Melrose Nakayama, MD;  Location: [facility];  Service: Open Heart Surgery;  Laterality: N/A;  . CARDIAC VALVE REPLACEMENT    . CORONARY ARTERY BYPASS GRAFT N/A 10/25/2012   Procedure: CORONARY ARTERY BYPASS GRAFTING (CABG);  Surgeon: Melrose Nakayama, MD;  Location: [facility] OR;  Service: Open Heart Surgery;  Laterality: N/A;  times 4 using left internal mammary artery and endoscopically harvested bilateral saphenous vein   . EP IMPLANTABLE DEVICE N/A 09/22/2015   Procedure: BiV Upgrade;  Surgeon: Deboraha Sprang, MD;  Location: [facility] LAB;  Service: Cardiovascular;  Laterality: N/A;  . INTRAOPERATIVE TRANSESOPHAGEAL ECHOCARDIOGRAM N/A 10/25/2012   Procedure: INTRAOPERATIVE TRANSESOPHAGEAL ECHOCARDIOGRAM;  Surgeon: Melrose Nakayama, MD;  Location: [facility];  Service: Open Heart Surgery;  Laterality: N/A;  . LEFT AND RIGHT HEART CATHETERIZATION WITH CORONARY ANGIOGRAM N/A 10/22/2012   Procedure: LEFT AND RIGHT HEART CATHETERIZATION WITH CORONARY ANGIOGRAM;  Surgeon: Larey Dresser, MD;  Location: [facility] CATH LAB;  Service: Cardiovascular;  Laterality: N/A;  . NASAL FRACTURE SURGERY  1970's  . PACEMAKER REVISION  09/22/2015   pacemaker upgrade   . PERMANENT PACEMAKER INSERTION N/A 07/15/2014   Procedure: PERMANENT PACEMAKER INSERTION;  Surgeon: Deboraha Sprang, MD;  Location: [facility] CATH LAB;  Service: Cardiovascular;  Laterality: N/A;  . TRACHEOSTOMY  07/2012  . TRACHEOSTOMY CLOSURE  08/2012  . TRANSURETHRAL MICROWAVE THERAPY  10/15/2012   Social History   Socioeconomic History  .  Marital status: Married    Spouse name: Not on file  . Number of children:  2  . Years of education: Not on file  . Highest education level: Not on file  Occupational History  .  Retired  Scientific laboratory technician  . Financial resource strain: Not on file  . Food insecurity:    Worry: Not on file    Inability: Not on file  . Transportation needs:    Medical: Not on file    Non-medical: Not on file  Tobacco Use  . Smoking status: Former Smoker    Packs/day: 2.00    Years: 58.00    Pack years: 116.00    Types: Cigarettes    Last attempt to quit: 07/20/2012    Years since quitting: 5.7  . Smokeless tobacco: Never Used  Substance and Sexual Activity  . Alcohol use: No    Alcohol/week:  Beer once or twice a year: 2 drinks    Comment: 10/17/2012 "years since I've had a drink; never had problem with it"  . Drug use: No  . Sexual activity: Not Currently   Current Outpatient Medications on File Prior to Visit  Medication Sig Dispense Refill  . allopurinol (ZYLOPRIM) 300 MG tablet Take 300 mg by mouth daily.    Marland Kitchen amoxicillin (AMOXIL) 500 MG capsule TAKE 4 CAPS 1 HOUR PRIOR TO TREATMENT    . aspirin EC 81 MG tablet Take 81 mg by mouth daily.    Marland Kitchen atorvastatin (LIPITOR) 80 MG tablet TAKE 1 TABLET (80 MG TOTAL) BY MOUTH DAILY BEFORE BREAKFAST. 90 tablet 3  . bisoprolol (ZEBETA) 5 MG tablet TAKE 1/2 TABLETS (2.5 MG TOTAL) BY MOUTH DAILY. 45 tablet 3  . calcitRIOL (ROCALTROL) 0.25 MCG capsule Take 0.25 mcg by mouth daily.     Marland Kitchen dextromethorphan-guaiFENesin (MUCINEX DM) 30-600 MG 12hr tablet Take 1 tablet by mouth 2 (two) times daily.    . fexofenadine (ALLEGRA) 180 MG tablet Take 90 mg by mouth daily.     . fluticasone furoate-vilanterol (BREO ELLIPTA) 100-25 MCG/INH AEPB Inhale 1 puff into the lungs daily. 60 each 0  . fluticasone furoate-vilanterol (BREO ELLIPTA) 100-25 MCG/INH AEPB Inhale 1 puff into the lungs daily. 60 each 5  . Fluticasone-Salmeterol (ADVAIR) 250-50 MCG/DOSE AEPB Inhale 1 puff into  the lungs every  12 (twelve) hours. 60 each 11  . glucose blood (ONETOUCH VERIO) test strip Use to check blood sugar once a day 100 each 12  . Insulin Degludec (TRESIBA FLEXTOUCH) 200 UNIT/ML SOPN Inject 34 Units into the skin daily. 6 pen 5  . Insulin Pen Needle 31G X 4 MM MISC 1 each by Does not apply route daily. Use to inject insulin daily 100 each 2  . ipratropium-albuterol (DUONEB) 0.5-2.5 (3) MG/3ML SOLN Take 3 mLs by nebulization every 6 (six) hours as needed (for shortness of breath).    . Omega-3 Fatty Acids (FISH OIL MAXIMUM STRENGTH PO) Take 1 capsule by mouth in the morning and at bedtime.     . pantoprazole (PROTONIX) 40 MG tablet Take 40 mg by mouth at bedtime.     . Potassium Chloride (KLOR-CON 10 PO) Take by mouth daily.    . Semaglutide, 1 MG/DOSE, (OZEMPIC, 1 MG/DOSE,) 4 MG/3ML SOPN Inject 1 mg into the skin once a week. 9 mL 3  . torsemide (DEMADEX) 20 MG tablet Take 2 tablets (40 mg total) by mouth 2 (two) times daily. 360 tablet 3   No current facility-administered medications on file prior to visit.   Allergies  Allergen Reactions  . Iodinated Diagnostic Agents Other (See Comments)    Decreased kidney function  . Omnipaque [Iohexol] Other (See Comments)    Decreased kidney function  . Primaxin [Imipenem] Hives  . Cephalosporins Rash    Blisters   . Lisinopril Nausea And Vomiting    Drops blood pressure   . Clindamycin/Lincomycin Swelling and Rash   Family History  Problem Relation Age of Onset  . Cancer Mother   . Heart disease Father   . Congestive Heart Failure Other   . Heart disease Other    Also,  hyperlipidemia, heart disease, hypertension and brother. Thyroid disease and cancer in mother.  PE: BP (!) 150/80   Pulse 90   Ht 5\' 5"  (1.651 m)   Wt 226 lb (102.5 kg)   SpO2 96%   BMI 37.61 kg/m  Wt Readings from Last 3 Encounters:  03/01/20 226 lb (102.5 kg)  01/19/20 229 lb 6.4 oz (104.1 kg)  12/03/19 228 lb 3.2 oz (103.5 kg)    Constitutional: overweight, in NAD Eyes: PERRLA, EOMI, no exophthalmos ENT: moist mucous membranes, no thyromegaly, no cervical lymphadenopathy Cardiovascular: RRR, No MRG Respiratory: CTA B Gastrointestinal: abdomen soft, NT, ND, BS+ Musculoskeletal: no deformities, strength intact in all 4 Skin: moist, warm, no rashes Neurological: no tremor with outstretched hands, DTR normal in all 4  ASSESSMENT: 1. 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 HL  3.  Obesity class II  PLAN:  1. Patient with longstanding, previously uncontrolled type 2 diabetes, on basal insulin and weekly GLP-1 receptor agonist.  We could not add an SGLT2 inhibitor due to poor kidney function.  This would have been ideal in the setting of his CHF and CKD.  At last visit sugars are mostly at goal with only 1 exception of high CBG in the morning after he had popcorn at night.  He was only checking in the morning, though, and I strongly advised him to start checking later in the day, also.  We did not change his regimen at that time.  HbA1c was very good at 6.4% - at this visit, almost all sugars are at  goal. He had 1 higher CBG at 175, after eating grapes. Also, he had 1 lower CBG at 72, unclear why but no other low CBGs after this. - he continues to adjust his diet - we can continue the same regimen for now - I suggested to:  Patient Instructions  Please continue: - Tresiba U200 34 units daily - Ozempic 1 mg weekly  Please return in 4 months with your sugar log.   - we checked his HbA1c: 6.4% (stable) - advised to check sugars at different times of the day - 1x a day, rotating check times - advised for yearly eye exams >> he is UTD - return to clinic in 4 months  2. HL -Reviewed latest lipid panel from 07/2019: LDL at goal, triglycerides high, HDL low: Lab Results  Component  Value Date   CHOL 118 07/24/2019   HDL 23 (L) 07/24/2019   LDLCALC 55 07/24/2019   LDLDIRECT 61.5 05/15/2013   TRIG 251 (H) 07/24/2019   CHOLHDL 5.0 07/04/2018  -Continues Lipitor and fish oil without side effects  3.  Obesity class 2 -We will continue Ozempic which should also help with weight loss -He continues exercise on the recumbent bike -He lost ~9 pounds in the year before last visit, lost 1 lb since last OV  Philemon Kingdom, MD PhD [facility] Endocrinology

## 2020-03-01 NOTE — Addendum Note (Signed)
Addended by: Cardell Peach I on: 03/01/2020 02:34 PM   Modules accepted: Orders

## 2020-03-11 ENCOUNTER — Other Ambulatory Visit: Payer: Self-pay

## 2020-03-11 ENCOUNTER — Ambulatory Visit (HOSPITAL_COMMUNITY)
Admission: RE | Admit: 2020-03-11 | Discharge: 2020-03-11 | Disposition: A | Discharge status: Home / Self Care | Payer: Medicare Other | Source: Ambulatory Visit | Attending: Cardiology | Admitting: Cardiology

## 2020-03-11 VITALS — BP 108/62 | HR 93 | Ht 65.0 in | Wt 228.0 lb

## 2020-03-11 DIAGNOSIS — Y831 Surgical operation with implant of artificial internal device as the cause of abnormal reaction of the patient, or of later complication, without mention of misadventure at the time of the procedure: Secondary | ICD-10-CM | POA: Insufficient documentation

## 2020-03-11 DIAGNOSIS — Z953 Presence of xenogenic heart valve: Secondary | ICD-10-CM | POA: Insufficient documentation

## 2020-03-11 DIAGNOSIS — Z923 Personal history of irradiation: Secondary | ICD-10-CM | POA: Insufficient documentation

## 2020-03-11 DIAGNOSIS — M109 Gout, unspecified: Secondary | ICD-10-CM | POA: Diagnosis not present

## 2020-03-11 DIAGNOSIS — I509 Heart failure, unspecified: Secondary | ICD-10-CM | POA: Diagnosis not present

## 2020-03-11 DIAGNOSIS — I5032 Chronic diastolic (congestive) heart failure: Secondary | ICD-10-CM | POA: Diagnosis not present

## 2020-03-11 DIAGNOSIS — J449 Chronic obstructive pulmonary disease, unspecified: Secondary | ICD-10-CM | POA: Diagnosis not present

## 2020-03-11 DIAGNOSIS — Z79899 Other long term (current) drug therapy: Secondary | ICD-10-CM | POA: Diagnosis not present

## 2020-03-11 DIAGNOSIS — I2510 Atherosclerotic heart disease of native coronary artery without angina pectoris: Secondary | ICD-10-CM | POA: Insufficient documentation

## 2020-03-11 DIAGNOSIS — G4733 Obstructive sleep apnea (adult) (pediatric): Secondary | ICD-10-CM | POA: Diagnosis not present

## 2020-03-11 DIAGNOSIS — I6522 Occlusion and stenosis of left carotid artery: Secondary | ICD-10-CM | POA: Diagnosis not present

## 2020-03-11 DIAGNOSIS — E785 Hyperlipidemia, unspecified: Secondary | ICD-10-CM | POA: Diagnosis not present

## 2020-03-11 DIAGNOSIS — Z7951 Long term (current) use of inhaled steroids: Secondary | ICD-10-CM | POA: Insufficient documentation

## 2020-03-11 DIAGNOSIS — I714 Abdominal aortic aneurysm, without rupture: Secondary | ICD-10-CM | POA: Diagnosis not present

## 2020-03-11 DIAGNOSIS — Z951 Presence of aortocoronary bypass graft: Secondary | ICD-10-CM | POA: Diagnosis not present

## 2020-03-11 DIAGNOSIS — N183 Chronic kidney disease, stage 3 unspecified: Secondary | ICD-10-CM | POA: Insufficient documentation

## 2020-03-11 DIAGNOSIS — Z8616 Personal history of COVID-19: Secondary | ICD-10-CM | POA: Diagnosis not present

## 2020-03-11 DIAGNOSIS — I255 Ischemic cardiomyopathy: Secondary | ICD-10-CM | POA: Insufficient documentation

## 2020-03-11 DIAGNOSIS — E1122 Type 2 diabetes mellitus with diabetic chronic kidney disease: Secondary | ICD-10-CM | POA: Insufficient documentation

## 2020-03-11 DIAGNOSIS — I441 Atrioventricular block, second degree: Secondary | ICD-10-CM | POA: Diagnosis not present

## 2020-03-11 DIAGNOSIS — T82857A Stenosis of cardiac prosthetic devices, implants and grafts, initial encounter: Secondary | ICD-10-CM | POA: Diagnosis not present

## 2020-03-11 DIAGNOSIS — Z794 Long term (current) use of insulin: Secondary | ICD-10-CM | POA: Diagnosis not present

## 2020-03-11 DIAGNOSIS — I442 Atrioventricular block, complete: Secondary | ICD-10-CM

## 2020-03-11 DIAGNOSIS — I130 Hypertensive heart and chronic kidney disease with heart failure and stage 1 through stage 4 chronic kidney disease, or unspecified chronic kidney disease: Secondary | ICD-10-CM | POA: Insufficient documentation

## 2020-03-11 DIAGNOSIS — Z8546 Personal history of malignant neoplasm of prostate: Secondary | ICD-10-CM | POA: Insufficient documentation

## 2020-03-11 DIAGNOSIS — Z91041 Radiographic dye allergy status: Secondary | ICD-10-CM | POA: Insufficient documentation

## 2020-03-11 DIAGNOSIS — Z87891 Personal history of nicotine dependence: Secondary | ICD-10-CM | POA: Diagnosis not present

## 2020-03-11 DIAGNOSIS — J8410 Pulmonary fibrosis, unspecified: Secondary | ICD-10-CM | POA: Diagnosis not present

## 2020-03-11 DIAGNOSIS — I5022 Chronic systolic (congestive) heart failure: Secondary | ICD-10-CM | POA: Diagnosis not present

## 2020-03-11 DIAGNOSIS — Z7982 Long term (current) use of aspirin: Secondary | ICD-10-CM | POA: Diagnosis not present

## 2020-03-11 DIAGNOSIS — Z950 Presence of cardiac pacemaker: Secondary | ICD-10-CM | POA: Diagnosis not present

## 2020-03-11 LAB — BASIC METABOLIC PANEL
Anion gap: 11 (ref 5–15)
BUN: 39 mg/dL — ABNORMAL HIGH (ref 8–23)
CO2: 26 mmol/L (ref 22–32)
Calcium: 9.6 mg/dL (ref 8.9–10.3)
Chloride: 103 mmol/L (ref 98–111)
Creatinine, Ser: 1.79 mg/dL — ABNORMAL HIGH (ref 0.61–1.24)
GFR calc Af Amer: 41 mL/min — ABNORMAL LOW (ref >60)
GFR calc non Af Amer: 36 mL/min — ABNORMAL LOW (ref >60)
Glucose, Bld: 131 mg/dL — ABNORMAL HIGH (ref 70–99)
Potassium: 3.5 mmol/L (ref 3.5–5.1)
Sodium: 140 mmol/L (ref 135–145)

## 2020-03-11 LAB — LIPID PANEL
Cholesterol: 105 mg/dL (ref 0–200)
HDL: 22 mg/dL — ABNORMAL LOW (ref >40)
LDL Cholesterol: 48 mg/dL (ref 0–99)
Total CHOL/HDL Ratio: 4.8 RATIO
Triglycerides: 174 mg/dL — ABNORMAL HIGH (ref <150)
VLDL: 35 mg/dL (ref 0–40)

## 2020-03-11 NOTE — Patient Instructions (Signed)
Labs done today, your results will be available in MyChart, we will contact you for abnormal readings.  Please call our office in March 2022 to schedule your follow up appointment  If you have any questions or concerns before your next appointment please send us a message through mychart or call our office at [phone].    TO LEAVE A MESSAGE FOR THE NURSE SELECT OPTION 2, PLEASE LEAVE A MESSAGE INCLUDING: . YOUR NAME . DATE OF BIRTH . CALL BACK NUMBER . REASON FOR CALL**this is important as we prioritize the call backs  YOU WILL RECEIVE A CALL BACK THE SAME DAY AS LONG AS YOU CALL BEFORE 4:00 PM  At the [facility], you and your health needs are our priority. As part of our continuing mission to provide you with exceptional heart care, we have created designated Provider Care Teams. These Care Teams include your primary Cardiologist (physician) and Advanced Practice Providers (APPs- Physician Assistants and Nurse Practitioners) who all work together to provide you with the care you need, when you need it.   You may see any of the following providers on your designated Care Team at your next follow up: . Dr Daniel Bensimhon . Dr Dalton McLean . Amy Clegg, NP . Brittainy Simmons, PA . Lauren Kemp, PharmD   Please be sure to bring in all your medications bottles to every appointment.    

## 2020-03-11 NOTE — Progress Notes (Signed)
ID:  [name], DOB 01/20/1941, MRN [id number]   Provider location:  Advanced Heart Failure Type of Visit: Established patient   PCP:  [name], MD  HF Cardiologist:  Dr. [name]   History of Present Illness: [name] is a 79 y.o. male who has a history of ARDS/respiratory failure and tracheostomy in 07/2012 as well as DM, HTN, and COPD presented to the ER at [facility] with CHF in 10/2012. Patient had a prolonged hospitalization in 07/2012 with H1N1 influenza and pneumococcal PNA. This progressed to ARDS. He was intubated and ended up with tracheostomy. He had the trach removed. He also had a left empyema requiring chest tube, septic shock with elevated troponin, and AKI which resolved.  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   He was admitted again in 10/2012 from his nursing home with exertional dyspnea and orthopnea. He had gained 21 lbs. Echo at admission showed EF 35% with global hypokinesis that looked worse in the anteroseptal wall. He also was noted to have aortic stenosis rated as moderate to severe. He was diuresed and cathed, showing severe 3 vessel disease. He then had CABG-AVR (bioprosthetic valve).   He had a large AAA and underwent surgical repair in 07/2013.  He had AKI and Pseudomonas PNA post-operatively.  He was discharged to a nursing home. Echo in 1/15 showed EF 55-60% with mildly dilated and dysfunctional RV and a bioprosthetic aortic valve with mean gradient 32 mmHg (higher than expected).   Repeat limited echo in 08/2013 showed shower aortic valve mean gradient of 21 mmHg.   In 11/15, patient had a holter monitor placed for bradycardia.  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  Baseline bifascicular block.  His HR also was noted to drop into the 40s with exercise, suggesting exercise-associated heart block.  He underwent Medtronic-PCM placement in 1/16 by Dr Caryl Comes.   Echo in 10/16 showed fall in EF to 30% with mean aortic valve  gradient 21 mmHg.  Repeat echo in 2/17 showed EF 20-25%, mean aortic valve gradient 24 mmHg with moderately decreased RV systolic function.  Cardiolite in 10/16 showed inferior scar with no ischemia.  He is RV pacing almost all the time.  Low dose DSE was done in 2/17 to assess for significant bioprosthetic valve stenosis => mean gradient with dobutamine was only 31 mmHg, so suspect no more than moderate stenosis.  He had St Jude CRT upgrade in 3/17.  He decided against ICD.  Repeat echo post-CRT in 8/17 showed EF 50%, diffuse hypokinesis, bioprosthetic aortic valve with mean gradient 27 mmHg.  Echo in 9/18 showed EF 50-55%, bioprosthetic aortic valve with mean gradient 25 mmHg.  Echo in 10/19 showed EF 50-55%, bioprosthetic aortic valve with mean gradient 25 mmHg.   In 9/20, he was admitted with COVID-19 infection.    Echo in 3/21 showed EF 50-55% with mild LVH, mild RV enlargement with normal function, PASP 25, bioprosthetic aortic valve with mean gradient 24 and AVA 1.38 cm^2.     He is doing well symptomatically.  Using CPAP nightly. No dyspnea walking in grocery store, gets tired walking in [place].  No chest pain. No orthopnea/PND.  No lightheadedness.  No palpitations. Weight is stable.     St Jude device interrogation: Thoracic impedance stable, 98% BiV pacing, no AF  ECG (personally reviewed): NSR, BiV paced  Labs (2/15): K 4, creatinine 1.6, BNP 166 Labs (3/15): Hgb 11.6, pro-BNP 1037, creatinine 1.76, BUN 30, LDL 75,  HDL 26 Labs (8/15): K 3.9, creatinine 1.55, HCT 40.6, LDL 71, HDL 27 Labs (1/16): K 4.3, Creatinine 1.6, HCT 42.9 Labs (2/17): K 4.0 Creatinine 1.64, TSH normal.  Labs (3/17): K 4.5, creatinine 1.49, HCT 42.9 Labs (4/17): K 4, creatinine 1.48, BNP 125 Labs (6/17): LDL 27, HDL 24, TGs 245 Labs (7/17): K 4.9, creatinine 1.71 Labs (8/17): K 4.3, creatinine 1.7, LDL 64, HDl 25, TGs 198 Lab (06/16/2016): K 4.9 Creatinine 1.84  Labs (10/2016): Creatinine 1.5 K 3.9  Labs  (6/18): K 4.4, creatinine 1.57, BNP 55 Labs (7/18): LDL 58, HDL 22, creatinine 1.64 Labs (11/19): K 3.9, creatinine 1.5  Labs (9/20): K 4, creatinine 1.62, hgb 15.5 Labs (2/21): LDL 52, HDL 24, TGs 202, hgb 14.2, K 3.8, creatinine 1.6 Labs (3/21): K 3.5, creatinine 1.8 Labs (6/21): K 4, creatinine 1.68  PMH: 1. PNA (H1N1 influenza + pneumococcus) in 7/03 complicated by respiratory failure/ARDS. Required tracheostomy, now weaned off. Had left empyema requiring chest tube.  Has developed post-infectious pulmonary fibrosis.  2. Prostate CA s/p radiation treatment. Has indwelling foley.  3. OSA: using CPAP 4. HTN  5. Hyperlipidemia  6. COPD: History of heavy smoking. PFTs (4/14) with mixed obstructive (COPD) and restrictive (post-ARDS) picture.  - CT chest 12/19 consistent with emphysema.  7. Type II diabetes  8. Anemia of chronic disease.  9. Ischemic cardiomyopathy:  - Echo (1/14) with severely dilated LV, EF 30-35%, diffuse hypokinesis worse in the anteroseptal wall, grade II diastolic dysfunction, mild MR, AS interpreted as "moderate to severe" with aortic valve mean gradient 23 mmHg.  - Echo (4/14) witih EF 35-40%, mid to apical anteroseptal akinesis, moderate to severe AS with mean gradient 33 mmHg, AVA 0.91 cm^2.  -Echo (5/14) post CABG showed EF 50%, anteroseptal hypokinesis, bioprosthetic aortic valve well-seated. - Echo (1/15) with EF 55-60%, mild LVH, mildly dilated RV with mildly decreased systolic function, D-shaped interventricular septum, bioprosthetic aortic valve with mean gardient 32 mmHg.   - Echo (3/15) with EF 55-60%, mild LVH, bioprosthetic aortic valve with mean gradient 21 mmHg (lower), no AI, RV moderately dilated with mild to moderately decreased systolic function.   - Echo (10/16) with EF 30%, bioprosthetic aortic valve with mean gradient 21 mmHg.   - Echo (2/17) with EF 20-25%, mild LVH, aortic valve mean gradient 24 mmHg/peak 39 mmHg, RV moderately dilated with  moderately decreased systolic function.  - St Jude CRT upgrade in 3/17 (did not want ICD).  - Echo (8/17) with EF 50%, diffuse hypokinesis, bioprosthetic aortic valve with mean gradient 27 mmHg, normal RV size with mildly decreased systolic function.   - AKI with lisinopril.  - Echo (9/18): EF 50-55%, bioprosthetic aortic valve with mean gradient 25 mmHg.  - Echo (10/19): EF 50-55%, bioprosthetic aortic valve with mean gradient 25 mmHg, normal RV size and systolic function.  - Echo (3/21): EF 50-55% with mild LVH, mild RV enlargement with normal function, PASP 25, bioprosthetic aortic valve with mean gradient 24 and AVA 1.38 cm^2.    10. Aortic stenosis: Moderate to severe by echo in 4/14. Bioprosthetic #21 [facility] Ease aortic valve replacement in 4/14.  Low dose DSE (2/17) showed that Mr Letizia likely has no more than moderate bioprosthetic valve stenosis with mean gradient up to 31 mmHg with dobutamine.  11. CAD: LHC (4/14) with 95% pLAD, 70-80% ostial ramus, 70% mLCx, 90% pRCA, EF 35%.  - CABG 4/14 with LIMA-LAD, seq SVG-ramus and OM1, SVG-PDA.  - Cardiolite (10/16) with EF  42%, fixed inferior defect, no ischemia.   12. Carotid stenosis: Carotid dopplers (3/71) with 69-67% LICA stenosis.  Carotids (8/93) with 81-01% LICA stenosis. Carotids (7/51) with 02-58% LICA stenosis.  - Carotid dopplers (5/27): 78-24% LICA stenosis.  - Carotid dopplers (2/35): 36-14% LICA stenosis.  13. AAA: 5/14 CT showed > 6 cm AAA, also right iliac aneurysm. Not stent graft candidate.  7/14 CT showed 7 cm AAA.  Surgical repair 1/15.  14. CKD: AKI in 7/14 from contrast nephropathy.  AKI in 1/15 after AAA repair.  15. Contrast allergy.  16. ABIs 9/14 were normal.  17. Gout 18. 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  HR drops with exercise (likely exercise-induced heart block).  Medtronic dual chamber PPM placed in 2016.  Recent pacemaker interrogation showed complete heart block.  He  is pacemaker-dependent, upgraded to CRT in 3/17.  19. COVID-19 PNA in 9/20  SH: Quit smoking in 1/14, married, no ETOH, lives in [place].   FH: No premature CAD  ROS: All systems reviewed and negative except as per HPI.    Current Outpatient Medications  Medication Sig Dispense Refill  . allopurinol (ZYLOPRIM) 300 MG tablet Take 300 mg by mouth daily.    Marland Kitchen amoxicillin (AMOXIL) 500 MG capsule TAKE 4 CAPS 1 HOUR PRIOR TO TREATMENT    . aspirin EC 81 MG tablet Take 81 mg by mouth daily.    Marland Kitchen atorvastatin (LIPITOR) 80 MG tablet TAKE 1 TABLET (80 MG TOTAL) BY MOUTH DAILY BEFORE BREAKFAST. 90 tablet 3  . bisoprolol (ZEBETA) 5 MG tablet TAKE 1/2 TABLETS (2.5 MG TOTAL) BY MOUTH DAILY. 45 tablet 3  . calcitRIOL (ROCALTROL) 0.25 MCG capsule Take 0.25 mcg by mouth daily.     Marland Kitchen dextromethorphan-guaiFENesin (MUCINEX DM) 30-600 MG 12hr tablet Take 1 tablet by mouth 2 (two) times daily.    . fexofenadine (ALLEGRA) 180 MG tablet Take 90 mg by mouth daily.     . fluticasone furoate-vilanterol (BREO ELLIPTA) 100-25 MCG/INH AEPB Inhale 1 puff into the lungs daily. 60 each 0  . glucose blood (ONETOUCH VERIO) test strip Use to check blood sugar once a day 100 each 12  . Insulin Degludec (TRESIBA FLEXTOUCH) 200 UNIT/ML SOPN Inject 34 Units into the skin daily. 6 pen 5  . Insulin Pen Needle 31G X 4 MM MISC 1 each by Does not apply route daily. Use to inject insulin daily 100 each 2  . KLOR-CON M10 10 MEQ tablet Take 20 mEq by mouth 2 (two) times daily. Taking 2 tablets by mouth in the am    . Omega-3 Fatty Acids (FISH OIL MAXIMUM STRENGTH PO) Take 1 capsule by mouth in the morning and at bedtime.     . pantoprazole (PROTONIX) 40 MG tablet Take 40 mg by mouth at bedtime.     . Semaglutide, 1 MG/DOSE, (OZEMPIC, 1 MG/DOSE,) 4 MG/3ML SOPN Inject 1 mg into the skin once a week. 9 mL 3  . torsemide (DEMADEX) 20 MG tablet Take 2 tablets (40 mg total) by mouth 2 (two) times daily. 360 tablet 3   No current  facility-administered medications for this encounter.    Vitals:   03/11/20 1111  BP: 108/62  Pulse: 93  SpO2: 97%  Weight: 103.4 kg (228 lb)  Height: 5\' 5"  (1.651 m)    Wt Readings from Last 3 Encounters:  03/11/20 103.4 kg (228 lb)  03/01/20 102.5 kg (226 lb)  01/19/20 104.1 kg (229 lb 6.4  oz)   General: NAD Neck: No JVD, no thyromegaly or thyroid nodule.  Lungs: Clear to auscultation bilaterally with normal respiratory effort. CV: Nondisplaced PMI.  Heart regular S1/S2, no S3/S4, 1/6 SEM RUSB.  No peripheral edema.  No carotid bruit.  Normal pedal pulses.  Abdomen: Soft, nontender, no hepatosplenomegaly, no distention.  Skin: Intact without lesions or rashes.  Neurologic: Alert and oriented x 3.  Psych: Normal affect. Extremities: No clubbing or cyanosis.  HEENT: Normal.   ASSESSMENT & PLAN: 1. CAD: Status post CABG for 3VD.  Cardiolite in 10/16 with inferior scar, no ischemia.  No chest pain.  - Continue ASA, statin.   2. Chronic systolic CHF:  Echo in 1/43 showed EF down to 20-25%.  No ischemia on Cardiolite.  Constant RV pacing may have led to decline in EF.  We considered bioprosthetic valve stenosis as cause of fall in EF, but low gradient DSE did not suggest severe stenosis.  Echo 8/17 after CRT upgrade shows improved EF to 50%.  Echo in 3/21 showed EF 50-55%.  Weight has been stable.  NYHA class II symptoms, not volume overloaded on exam.   - Continue bisoprolol 2.5 mg daily.   - AKI with lisinopril, will leave off.  - Continue torsemide 40 mg bid.  BMET today.  3. 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 Suspect that intrinsic lung disease (post-infectious pulmonary fibrosis) is a contributor to his dyspnea and hypoxemia. He is now off oxygen (just using CPAP at night).  He had emphysema noted on 12/19 CT.  - Follows with pulmonary.   4. Carotid stenosis: Followed at [facility]. 5. Hyperlipidemia: Check lipids today.   6. AAA: s/p surgical  repair. 7. Status post bioprosthetic AVR: Gradient across the aortic valve has been elevated. There was some concern for possible low gradient severe bioprosthetic aortic stenosis, but low dose DSE in 2017 suggested that valvular stenosis was no more than moderate.  Mean gradient 24 mmHg across bioprosthetic valve on 3/21 echo, this is stable compared to prior echoes.    - He will need antibiotic prophylaxis with dental work.  - If bioprosthetic aortic valve stenosis worsens, he would be a candidate for TAVR.  8. CKD: Stage III. Check BMET today.  9. 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  S/p Medtronic PCM 1/16.   Patient is pacemaker-dependent with complete heart block.  He had upgrade to CRT in 3/17.   10. OSA: He is on CPAP.  Continue CPAP nightly.  11. Gout: on allopurinol.  Followup in 6 months.   Signed, Loralie Champagne, MD  03/11/2020  [facility] [address] [facility] [place] [address] [phone] (office) [phone] (fax)

## 2020-03-23 ENCOUNTER — Other Ambulatory Visit: Payer: Self-pay | Admitting: Internal Medicine

## 2020-03-30 ENCOUNTER — Other Ambulatory Visit: Payer: Self-pay | Admitting: Internal Medicine

## 2020-04-29 DIAGNOSIS — Z23 Encounter for immunization: Secondary | ICD-10-CM | POA: Diagnosis not present

## 2020-05-07 ENCOUNTER — Ambulatory Visit (INDEPENDENT_AMBULATORY_CARE_PROVIDER_SITE_OTHER): Payer: Medicare Other

## 2020-05-07 DIAGNOSIS — I442 Atrioventricular block, complete: Secondary | ICD-10-CM

## 2020-05-07 LAB — CUP PACEART REMOTE DEVICE CHECK
Battery Remaining Longevity: 77 mo
Battery Remaining Percentage: 80 %
Battery Voltage: 2.93 V
Brady Statistic AP VP Percent: 1 %
Brady Statistic AP VS Percent: 1 %
Brady Statistic AS VP Percent: 98 %
Brady Statistic AS VS Percent: 1 %
Brady Statistic RA Percent Paced: 1 %
Date Time Interrogation Session: 20211105075144
Implantable Lead Implant Date: 20160113
Implantable Lead Implant Date: 20160113
Implantable Lead Implant Date: 20170322
Implantable Lead Location: 753858
Implantable Lead Location: 753859
Implantable Lead Location: 753860
Implantable Lead Model: 5076
Implantable Lead Model: 5076
Implantable Pulse Generator Implant Date: 20170322
Lead Channel Impedance Value: 410 Ohm
Lead Channel Impedance Value: 490 Ohm
Lead Channel Impedance Value: 640 Ohm
Lead Channel Pacing Threshold Amplitude: 0.5 V
Lead Channel Pacing Threshold Amplitude: 0.625 V
Lead Channel Pacing Threshold Amplitude: 0.875 V
Lead Channel Pacing Threshold Pulse Width: 0.4 ms
Lead Channel Pacing Threshold Pulse Width: 0.4 ms
Lead Channel Pacing Threshold Pulse Width: 0.4 ms
Lead Channel Sensing Intrinsic Amplitude: 12 mV
Lead Channel Sensing Intrinsic Amplitude: 2.9 mV
Lead Channel Setting Pacing Amplitude: 1.625
Lead Channel Setting Pacing Amplitude: 2 V
Lead Channel Setting Pacing Amplitude: 2.5 V
Lead Channel Setting Pacing Pulse Width: 0.4 ms
Lead Channel Setting Pacing Pulse Width: 0.4 ms
Lead Channel Setting Sensing Sensitivity: 5 mV
Pulse Gen Model: 3262
Pulse Gen Serial Number: 7838612

## 2020-05-10 NOTE — Progress Notes (Signed)
Remote pacemaker transmission.   

## 2020-05-20 ENCOUNTER — Other Ambulatory Visit (HOSPITAL_COMMUNITY): Payer: Self-pay | Admitting: Cardiology

## 2020-05-24 ENCOUNTER — Other Ambulatory Visit: Payer: Self-pay

## 2020-05-24 DIAGNOSIS — E1159 Type 2 diabetes mellitus with other circulatory complications: Secondary | ICD-10-CM

## 2020-05-24 DIAGNOSIS — E1165 Type 2 diabetes mellitus with hyperglycemia: Secondary | ICD-10-CM

## 2020-05-24 MED ORDER — ONETOUCH VERIO VI STRP: ORAL_STRIP | 12 refills | Status: DC

## 2020-05-24 NOTE — Telephone Encounter (Signed)
Inbound fax from [facility] requesting RX be re filled with ICD-10 code for billing purposes.

## 2020-05-25 ENCOUNTER — Telehealth: Payer: Self-pay | Admitting: Internal Medicine

## 2020-05-25 NOTE — Telephone Encounter (Signed)
Attempted phone call to pt.  Left voicemail message for pt to contact RN or triage nurse at [phone].

## 2020-05-25 NOTE — Telephone Encounter (Signed)
Patient states that he needs to speak with Dr. Caryl Comes or nurse. Please call

## 2020-05-26 ENCOUNTER — Other Ambulatory Visit: Payer: Self-pay

## 2020-05-26 DIAGNOSIS — E1159 Type 2 diabetes mellitus with other circulatory complications: Secondary | ICD-10-CM

## 2020-05-26 MED ORDER — ONETOUCH VERIO VI STRP: ORAL_STRIP | 12 refills | Status: AC

## 2020-05-26 NOTE — Telephone Encounter (Signed)
Rx for OneTouch test strips sent to preferred pharmacy.

## 2020-05-31 ENCOUNTER — Other Ambulatory Visit: Payer: Self-pay

## 2020-05-31 ENCOUNTER — Telehealth: Payer: Self-pay | Admitting: Internal Medicine

## 2020-05-31 MED ORDER — ONETOUCH VERIO VI SOLN: 3 refills | Status: AC

## 2020-05-31 NOTE — Telephone Encounter (Signed)
RX sent to pharmacy  

## 2020-05-31 NOTE — Telephone Encounter (Signed)
Patient called to request a new RX  for OneTouch Verio Level 3 Control Solution (Pharmacist told patient what he needs) sent to  [id number] - [place], [place] Phone:  [phone]  Fax:  [phone]

## 2020-06-01 NOTE — Telephone Encounter (Signed)
Spoke with pt who states he was originally calling about his device monitor but he has spoken with device clinic and remote was sent successfully and nothing further is needed at this time. Pt thanked Therapist, sports for call.

## 2020-06-14 DIAGNOSIS — Z23 Encounter for immunization: Secondary | ICD-10-CM | POA: Diagnosis not present

## 2020-06-19 ENCOUNTER — Other Ambulatory Visit (HOSPITAL_COMMUNITY): Payer: Self-pay | Admitting: Cardiology

## 2020-06-29 ENCOUNTER — Other Ambulatory Visit: Payer: Self-pay | Admitting: Pulmonary Disease

## 2020-07-08 ENCOUNTER — Encounter: Payer: Self-pay | Admitting: Internal Medicine

## 2020-07-08 ENCOUNTER — Ambulatory Visit (INDEPENDENT_AMBULATORY_CARE_PROVIDER_SITE_OTHER): Payer: Medicare Other | Admitting: Internal Medicine

## 2020-07-08 ENCOUNTER — Other Ambulatory Visit: Payer: Self-pay

## 2020-07-08 VITALS — BP 128/80 | HR 88 | Ht 65.0 in | Wt 232.0 lb

## 2020-07-08 DIAGNOSIS — E785 Hyperlipidemia, unspecified: Secondary | ICD-10-CM

## 2020-07-08 DIAGNOSIS — E1159 Type 2 diabetes mellitus with other circulatory complications: Secondary | ICD-10-CM

## 2020-07-08 DIAGNOSIS — E1165 Type 2 diabetes mellitus with hyperglycemia: Secondary | ICD-10-CM | POA: Diagnosis not present

## 2020-07-08 DIAGNOSIS — Z6835 Body mass index (BMI) 35.0-35.9, adult: Secondary | ICD-10-CM

## 2020-07-08 LAB — POCT GLYCOSYLATED HEMOGLOBIN (HGB A1C): Hemoglobin A1C: 6.7 % — AB (ref 4.0–5.6)

## 2020-07-08 NOTE — Addendum Note (Signed)
Addended by: Lauralyn Primes on: 07/08/2020 02:39 PM   Modules accepted: Orders

## 2020-07-08 NOTE — Progress Notes (Signed)
Patient ID: [name], male   DOB: [birth year], 80 y.o.   MRN: [id number]   This visit occurred during the SARS-CoV-2 public health emergency.  Safety protocols were in place, including screening questions prior to the visit, additional usage of staff PPE, and extensive cleaning of exam room while observing appropriate contact time as indicated for disinfecting solutions.   HPI: [name] is a 80 y.o.-year-old male, referred by his cardiologist, Dr. 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  Last visit 4 months ago.  Reviewed HbA1c levels: Lab Results  Component Value Date   HGBA1C 6.4 (A) 03/01/2020   HGBA1C 6.4 (A) 10/30/2019   HGBA1C 6.2 (A) 06/30/2019   HGBA1C 7.1 (A) 11/18/2018   HGBA1C 6.6 (A) 08/30/2018   HGBA1C 8.3 (A) 04/30/2018   HGBA1C 5.5 10/24/2012   HGBA1C 6.4 (H) 07/24/2012   Pt is on a regimen of: - Tresiba 36 >> 26 >> 30 >> 46 >> 40 >> 34 units daily - Ozempic 0.5 >> 1 mg weekly -started 04/2018 -no GI side effects.  This is expensive for him, at $180, but he tells me that is not completely unaffordable. He was on Januvia 50 mg daily, but this did not help >> stopped 2018. He was on Jardiance.  He checks his sugars once a day: - am: 89-130, 145 >> 72, 105-134, 154 >> 105-130, 145 >> 80's, 91-138, 160 - 2h after b'fast: n/c - before lunch: 111-143, 148 >> 148-181 >> n/c >> 130, 136 >> 99, 144, 185 - 2h after lunch: n/c >> 126-183 >> n/c - before dinner:  95, 133-163, 177 >> 111-168, 182 >> n/c >> 95-136 >> n/c - 2h after dinner: 139-165, 212 >> 143-210 >> n/c >> 132, 175 >> 91, 146 - bedtime: n/c >> 151 >> n/c - nighttime: n/c Lowest sugar was  95 ... >> 70s >> 80s; it is unclear at which level he has hypoglycemia awareness. Highest sugar was 256 ... >> 175 (grapes) >> 185.  Glucometer: One Touch Verio  Pt's  meals are: - Breakfast: Banana, 2 clementines, different fruit in season, low-salt chips - Lunch: Meat, 2 veggies, toast, and sweet tea - Dinner: Sandwich (banana or meat, tomato), 1 Clementine - Snacks: Frequently after dinner He continues to exercise on his recumbent bike.  -+ CKD, last BUN/creatinine:  Lab Results  Component Value Date   BUN 39 (H) 03/11/2020   BUN 41 (H) 12/03/2019   CREATININE 1.79 (H) 03/11/2020   CREATININE 1.68 (H) 12/03/2019   -+ HL; last set of lipids: Lab Results  Component Value Date   CHOL 105 03/11/2020   HDL 22 (L) 03/11/2020   LDLCALC 48 03/11/2020   LDLDIRECT 61.5 05/15/2013   TRIG 174 (H) 03/11/2020   CHOLHDL 4.8 03/11/2020  On Lipitor 80 and fish oil.  His cardiologist also recommended Vascepa - samples for 6 weeks but he could not afford it.  - last eye exam was in 2021: No DR.  Small cataracts.  -No numbness and tingling in his feet.  Pt has FH of DM in S, B, Father.  She also has HTN, OSA. He had Covid19 03/2019.  He completed his COVID vaccination and also had a flu shot.  ROS: Constitutional: + weight gain/no weight loss, no fatigue, no subjective hyperthermia, no subjective hypothermia Eyes: no blurry vision, no xerophthalmia ENT: no  sore throat, no nodules palpated in neck, no dysphagia, no odynophagia, no hoarseness Cardiovascular: no CP/no SOB/no palpitations/no leg swelling Respiratory: no cough/no SOB/no wheezing Gastrointestinal: no N/no V/no D/no C/no acid reflux Musculoskeletal: no muscle aches/no joint aches Skin: no rashes, no hair loss Neurological: no tremors/no numbness/no tingling/no dizziness  I reviewed pt's medications, allergies, PMH, social hx, family hx, and changes were documented in the history of present illness. Otherwise, unchanged from my initial visit note.  Past Medical History:  Diagnosis Date  . AAA (abdominal aortic aneurysm) ([place])    5/14 CT showed > 6 cm AAA, also right iliac aneurysm. Not  stent graft candidate.   . Anemia of chronic disease   . Anxiety    pt. admits that he has anxiety at times   . Aortic stenosis    Moderate to severe by echo in 4/14. Bioprosthetic #21 [facility] Ease aortic valve replacement in 4/14.   . Arthritis   . CAD (coronary artery disease)    a. 4/14 CABG: LIMA-LAD, seq SVG-ramus & OM1, SVG-PDA  . Carotid arterial disease (HCC)    Carotid dopplers (8/67) with 67-20% LICA stenosis.   . CHF (congestive heart failure) ([place]) 07/2012; 10/17/2012  . Chronic kidney disease    increased creatinine recently - being followed, near kidney failure fr. contrast dye   . Chronic systolic heart failure (HCC)    a. 1/14 ECHO: sev dil LV, EF30-35%, diff HK, mild MR, AS severe, AV grad 35 b. 8/14 ECHO: EF 55-60%, mild biopros AV sten mn grad. 25, RV mild dil, RA mild dil  . Complication of anesthesia    during last surgery had to be given special medicine b/c "something dropped" during surgery   . COPD (chronic obstructive pulmonary disease) ([place])    a.  History of heavy smoking. PFTs (4/14) with mixed obstructive (COPD) and restrictive (post-ARDS) picture.   . Diabetes mellitus without complication ([place])   . Esophageal reflux   . History of blood transfusion    "lots since January" (10/17/2012)  . Hyperlipidemia   . Hypertension   . Ischemic cardiomyopathy   . Ischemic cardiomyopathy    a. 4/14 LHC: pLAD 95, ost ramus 70-80, mLCx 90, EF 30%  . OSA (obstructive sleep apnea)    on cpap- every sleep time.   . Pneumonia 07/2012   PNA (H1N1 influenza + pneumococcus) in 9/47 complicated by respiratory failure/ARDS. Required tracheostomy, now weaned off. Had left empyema requiring chest tube.   . Pneumonia due to COVID-19 virus 03/22/2019  . Prediabetes   . Prostate cancer [facility])     s/p radiation treatment. - (PT. DENIES)Has indwelling foley.   . Shortness of breath    still goes deer hunting by himself   Past Surgical History:  Procedure Laterality Date  .  ABDOMINAL AORTIC ANEURYSM REPAIR N/A 07/10/2013   Procedure: Resection and Graftiong of perirenal AAA; Insertion 14 x 8 Hemashield Graft Aorta to Left Common Iliac and to Right Common Femoral Artery With Ligastion of Right External and Interanl Iliac Artery;  Surgeon: Mal Misty, MD;  Location: [place];  Service: Vascular;  Laterality: N/A;  . ANAL FISSURE REPAIR  2008  . AORTIC VALVE REPLACEMENT N/A 10/25/2012   Procedure: AORTIC VALVE REPLACEMENT (AVR);  Surgeon: Melrose Nakayama, MD;  Location: [place];  Service: Open Heart Surgery;  Laterality: N/A;  . CARDIAC VALVE REPLACEMENT    . CORONARY ARTERY BYPASS GRAFT N/A 10/25/2012   Procedure: CORONARY ARTERY BYPASS GRAFTING (  CABG);  Surgeon: Melrose Nakayama, MD;  Location: [place];  Service: Open Heart Surgery;  Laterality: N/A;  times 4 using left internal mammary artery and endoscopically harvested bilateral saphenous vein   . EP IMPLANTABLE DEVICE N/A 09/22/2015   Procedure: BiV Upgrade;  Surgeon: Deboraha Sprang, MD;  Location: [place] [facility] LAB;  Service: Cardiovascular;  Laterality: N/A;  . INTRAOPERATIVE TRANSESOPHAGEAL ECHOCARDIOGRAM N/A 10/25/2012   Procedure: INTRAOPERATIVE TRANSESOPHAGEAL ECHOCARDIOGRAM;  Surgeon: Melrose Nakayama, MD;  Location: [place];  Service: Open Heart Surgery;  Laterality: N/A;  . LEFT AND RIGHT HEART CATHETERIZATION WITH CORONARY ANGIOGRAM N/A 10/22/2012   Procedure: LEFT AND RIGHT HEART CATHETERIZATION WITH CORONARY ANGIOGRAM;  Surgeon: Larey Dresser, MD;  Location: [facility] C[facility] LAB;  Service: Cardiovascular;  Laterality: N/A;  . NASAL FRACTURE SURGERY  1970's  . PACEMAKER REVISION  09/22/2015   pacemaker upgrade   . PERMANENT PACEMAKER INSERTION N/A 07/15/2014   Procedure: PERMANENT PACEMAKER INSERTION;  Surgeon: Deboraha Sprang, MD;  Location: [facility] CATH LAB;  Service: Cardiovascular;  Laterality: N/A;  . TRACHEOSTOMY  07/2012  . TRACHEOSTOMY CLOSURE  08/2012  . TRANSURETHRAL MICROWAVE THERAPY  10/15/2012    Social History   Socioeconomic History  . Marital status: Married    Spouse name: Not on file  . Number of children:  2  . Years of education: Not on file  . Highest education level: Not on file  Occupational History  .  Retired  Scientific laboratory technician  . Financial resource strain: Not on file  . Food insecurity:    Worry: Not on file    Inability: Not on file  . Transportation needs:    Medical: Not on file    Non-medical: Not on file  Tobacco Use  . Smoking status: Former Smoker    Packs/day: 2.00    Years: 58.00    Pack years: 116.00    Types: Cigarettes    Last attempt to quit: 07/20/2012    Years since quitting: 5.7  . Smokeless tobacco: Never Used  Substance and Sexual Activity  . Alcohol use: No    Alcohol/week:  Beer once or twice a year: 2 drinks    Comment: 10/17/2012 "years since I've had a drink; never had problem with it"  . Drug use: No  . Sexual activity: Not Currently   Current Outpatient Medications on File Prior to Visit  Medication Sig Dispense Refill  . allopurinol (ZYLOPRIM) 300 MG tablet Take 300 mg by mouth daily.    Marland Kitchen amoxicillin (AMOXIL) 500 MG capsule TAKE 4 CAPS 1 HOUR PRIOR TO TREATMENT    . aspirin EC 81 MG tablet Take 81 mg by mouth daily.    Marland Kitchen atorvastatin (LIPITOR) 80 MG tablet TAKE 1 TABLET (80 MG TOTAL) BY MOUTH DAILY BEFORE BREAKFAST. 90 tablet 3  . bisoprolol (ZEBETA) 5 MG tablet TAKE 1/2 TABLETS (2.5 MG TOTAL) BY MOUTH DAILY. 45 tablet 3  . Blood Glucose Calibration (ONETOUCH VERIO) SOLN Use to calibrate one touch verio level 3 1 each 3  . BREO ELLIPTA 100-25 MCG/INH AEPB TAKE 1 PUFF BY MOUTH EVERY DAY 60 each 5  . calcitRIOL (ROCALTROL) 0.25 MCG capsule Take 0.25 mcg by mouth daily.     Marland Kitchen dextromethorphan-guaiFENesin (MUCINEX DM) 30-600 MG 12hr tablet Take 1 tablet by mouth 2 (two) times daily.    . fexofenadine (ALLEGRA) 180 MG tablet Take 90 mg by mouth daily.     Marland Kitchen glucose blood (ONETOUCH VERIO) test strip Use  to check blood sugar once a  day. 100 strip 12  . Insulin Degludec (TRESIBA FLEXTOUCH) 200 UNIT/ML SOPN Inject 34 Units into the skin daily. 6 pen 5  . Insulin Pen Needle 31G X 4 MM MISC 1 each by Does not apply route daily. Use to inject insulin daily 100 each 2  . KLOR-CON M10 10 MEQ tablet TAKE 2 TABLETS (20 MEQ TOTAL) BY MOUTH 2 (TWO) TIMES DAILY. 360 tablet 1  . Omega-3 Fatty Acids (FISH OIL MAXIMUM STRENGTH PO) Take 1 capsule by mouth in the morning and at bedtime.     Marland Kitchen OZEMPIC, 1 MG/DOSE, 4 MG/3ML SOPN INJECT 1 MG INTO THE SKIN ONCE A WEEK 9 mL 1  . pantoprazole (PROTONIX) 40 MG tablet Take 40 mg by mouth at bedtime.     . torsemide (DEMADEX) 20 MG tablet TAKE 2 TABLETS (40 MG TOTAL) BY MOUTH 2 (TWO) TIMES DAILY. 360 tablet 3   No current facility-administered medications on file prior to visit.   Allergies  Allergen Reactions  . Iodinated Diagnostic Agents Other (See Comments)    Decreased kidney function  . Omnipaque [Iohexol] Other (See Comments)    Decreased kidney function  . Primaxin [Imipenem] Hives  . Cephalosporins Rash    Blisters   . Lisinopril Nausea And Vomiting    Drops blood pressure   . Clindamycin/Lincomycin Swelling and Rash   Family History  Problem Relation Age of Onset  . Cancer Mother   . Heart disease Father   . Congestive Heart Failure Other   . Heart disease Other    Also,  hyperlipidemia, heart disease, hypertension and brother. Thyroid disease and cancer in mother.  PE: BP 128/80   Pulse 88   Ht 5\' 5"  (1.651 m)   Wt 232 lb (105.2 kg)   SpO2 98%   BMI 38.61 kg/m  Wt Readings from Last 3 Encounters:  07/08/20 232 lb (105.2 kg)  03/11/20 228 lb (103.4 kg)  03/01/20 226 lb (102.5 kg)   Constitutional: overweight, in NAD Eyes: PERRLA, EOMI, no exophthalmos ENT: moist mucous membranes, no thyromegaly, no cervical lymphadenopathy Cardiovascular: RRR, No MRG Respiratory: CTA B Gastrointestinal: abdomen soft, NT, ND, BS+ Musculoskeletal: no deformities, strength  intact in all 4 Skin: moist, warm, + rash on forearm- chronic ecchymoses Neurological: + Mild tremor with outstretched hands, DTR normal in all 4  ASSESSMENT: 1. 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 HL  3.  Obesity class II  PLAN:  1. Patient with longstanding, previously uncontrolled type 2 diabetes, on basal insulin and weekly GLP-1 receptor agonist.  We could not add an SGLT2 inhibitor in the past due to poor kidney function.  This would have been ideal in the setting of CHF and CKD.  At our at last visit, sugars are almost all at goal, with only 1 higher CBG at 175, after eating grapes.  We did not change the regimen at that time.  HbA1c was stable, at 6.4%, at goal. -At today's visit, sugars are in goal at goal in the morning and a little more variable daily in the day, but mostly at goal.  He had more dietary since over the holidays but now getting back to the previous diet.  He is not exercising as he is recumbent bike is outside and he cannot go to the gym due to the  coronavirus pandemic.  I strongly advised him to move the bike inside the house to be able to exercise daily.  Otherwise, I do not feel he needs a change in his regimen. - I suggested to:  Patient Instructions  Please continue: - Tresiba U200 34 units daily - Ozempic 1 mg weekly  Restart using the recumbent bike daily.  Please return in 4 months with your sugar log.   - we checked his HbA1c: 6.7% (higher) - advised to check sugars at different times of the day - 1x a day, rotating check times - advised for yearly eye exams >> he is UTD - return to clinic in 3-4 months  2. HL -Reviewed latest lipid panel from 03/2019 LDL at goal, HDL low, triglycerides slightly high: Lab Results  Component Value Date   CHOL 105 03/11/2020   HDL 22 (L) 03/11/2020   LDLCALC 48 03/11/2020    LDLDIRECT 61.5 05/15/2013   TRIG 174 (H) 03/11/2020   CHOLHDL 4.8 03/11/2020  -Continues Lipitor 80 and fish oil without side effects  3.  Obesity class 2 -We will continue Ozempic, which should also help with weight loss -He used to exercise on the recumbent bike >> not recently -advised to restart -gained 6 lbs during the [place], but now working on improving diet.  Philemon Kingdom, MD PhD [facility] Endocrinology

## 2020-07-08 NOTE — Patient Instructions (Addendum)
Please continue: - Tresiba U200 34 units daily - Ozempic 1 mg weekly  Restart using the recumbent bike daily.  Please return in 4 months with your sugar log.

## 2020-08-03 ENCOUNTER — Other Ambulatory Visit (HOSPITAL_COMMUNITY): Payer: Self-pay | Admitting: Cardiology

## 2020-08-06 ENCOUNTER — Ambulatory Visit (INDEPENDENT_AMBULATORY_CARE_PROVIDER_SITE_OTHER): Payer: Medicare Other

## 2020-08-06 DIAGNOSIS — I442 Atrioventricular block, complete: Secondary | ICD-10-CM | POA: Diagnosis not present

## 2020-08-06 LAB — CUP PACEART REMOTE DEVICE CHECK
Battery Remaining Longevity: 68 mo
Battery Remaining Percentage: 71 %
Battery Voltage: 2.92 V
Brady Statistic AP VP Percent: 1 %
Brady Statistic AP VS Percent: 1 %
Brady Statistic AS VP Percent: 97 %
Brady Statistic AS VS Percent: 1.2 %
Brady Statistic RA Percent Paced: 1 %
Date Time Interrogation Session: 20220204080135
Implantable Lead Implant Date: 20160113
Implantable Lead Implant Date: 20160113
Implantable Lead Implant Date: 20170322
Implantable Lead Location: 753858
Implantable Lead Location: 753859
Implantable Lead Location: 753860
Implantable Lead Model: 5076
Implantable Lead Model: 5076
Implantable Pulse Generator Implant Date: 20170322
Lead Channel Impedance Value: 400 Ohm
Lead Channel Impedance Value: 460 Ohm
Lead Channel Impedance Value: 640 Ohm
Lead Channel Pacing Threshold Amplitude: 0.5 V
Lead Channel Pacing Threshold Amplitude: 0.625 V
Lead Channel Pacing Threshold Amplitude: 1 V
Lead Channel Pacing Threshold Pulse Width: 0.4 ms
Lead Channel Pacing Threshold Pulse Width: 0.4 ms
Lead Channel Pacing Threshold Pulse Width: 0.4 ms
Lead Channel Sensing Intrinsic Amplitude: 12 mV
Lead Channel Sensing Intrinsic Amplitude: 2.9 mV
Lead Channel Setting Pacing Amplitude: 1.625
Lead Channel Setting Pacing Amplitude: 2 V
Lead Channel Setting Pacing Amplitude: 2.5 V
Lead Channel Setting Pacing Pulse Width: 0.4 ms
Lead Channel Setting Pacing Pulse Width: 0.4 ms
Lead Channel Setting Sensing Sensitivity: 5 mV
Pulse Gen Model: 3262
Pulse Gen Serial Number: 7838612

## 2020-08-10 NOTE — Progress Notes (Signed)
Remote pacemaker transmission.   

## 2020-08-23 ENCOUNTER — Other Ambulatory Visit (HOSPITAL_COMMUNITY): Payer: Self-pay | Admitting: Cardiology

## 2020-08-25 ENCOUNTER — Encounter: Payer: Self-pay | Admitting: Internal Medicine

## 2020-08-25 NOTE — Progress Notes (Signed)
Received labs from PCP (Dr. [name]) checked on 08/10/2020: -Lipid panel: 101/138 (?  Unclear)/25/48 -ACR 179 -Vitamin D 62.6 -BNP 1116 -CBC with differential normal -CMP: glucose 81, BUN/creatinine 36/1.49, GFR 44, alkaline phosphatase 154 (40-129)

## 2020-09-04 ENCOUNTER — Other Ambulatory Visit: Payer: Self-pay | Admitting: Internal Medicine

## 2020-10-24 ENCOUNTER — Other Ambulatory Visit (HOSPITAL_COMMUNITY): Payer: Self-pay | Admitting: Cardiology

## 2020-11-05 ENCOUNTER — Ambulatory Visit (INDEPENDENT_AMBULATORY_CARE_PROVIDER_SITE_OTHER): Payer: Medicare Other

## 2020-11-05 DIAGNOSIS — I442 Atrioventricular block, complete: Secondary | ICD-10-CM | POA: Diagnosis not present

## 2020-11-05 LAB — CUP PACEART REMOTE DEVICE CHECK
Battery Remaining Longevity: 68 mo
Battery Remaining Percentage: 71 %
Battery Voltage: 2.92 V
Brady Statistic AP VP Percent: 1 %
Brady Statistic AP VS Percent: 1 %
Brady Statistic AS VP Percent: 97 %
Brady Statistic AS VS Percent: 1.2 %
Brady Statistic RA Percent Paced: 1 %
Date Time Interrogation Session: 20220506040040
Implantable Lead Implant Date: 20160113
Implantable Lead Implant Date: 20160113
Implantable Lead Implant Date: 20170322
Implantable Lead Location: 753858
Implantable Lead Location: 753859
Implantable Lead Location: 753860
Implantable Lead Model: 5076
Implantable Lead Model: 5076
Implantable Pulse Generator Implant Date: 20170322
Lead Channel Impedance Value: 390 Ohm
Lead Channel Impedance Value: 450 Ohm
Lead Channel Impedance Value: 600 Ohm
Lead Channel Pacing Threshold Amplitude: 0.5 V
Lead Channel Pacing Threshold Amplitude: 0.625 V
Lead Channel Pacing Threshold Amplitude: 1 V
Lead Channel Pacing Threshold Pulse Width: 0.4 ms
Lead Channel Pacing Threshold Pulse Width: 0.4 ms
Lead Channel Pacing Threshold Pulse Width: 0.4 ms
Lead Channel Sensing Intrinsic Amplitude: 12 mV
Lead Channel Sensing Intrinsic Amplitude: 3 mV
Lead Channel Setting Pacing Amplitude: 1.625
Lead Channel Setting Pacing Amplitude: 2 V
Lead Channel Setting Pacing Amplitude: 2.5 V
Lead Channel Setting Pacing Pulse Width: 0.4 ms
Lead Channel Setting Pacing Pulse Width: 0.4 ms
Lead Channel Setting Sensing Sensitivity: 5 mV
Pulse Gen Model: 3262
Pulse Gen Serial Number: 7838612

## 2020-11-12 ENCOUNTER — Telehealth: Payer: Self-pay | Admitting: Pulmonary Disease

## 2020-11-12 NOTE — Telephone Encounter (Signed)
Call returned to patient, confirmed DOB. Patient requesting to know if Memory Dance comes in a nebulizer form. He does not feel the Memory Dance is helping him. He had some left over albuterol via nebulizer from a old prescription years ago. He reports he uses the albuterol and he feels much better afterwards. Patient states the pharmacy with [facility] told him there was a nebulizer from of [place]. I made the patient aware I did not think there was a nebulizer form of Breo however I would get this information to his provider and get back with him.   Voiced understanding.   Please advise is there a nebulizer form of Breo? Also pt requesting a nebulizer medication that he can use in place of Breo as he does not feel it gives him much relief with his SOB.   Thanks :)

## 2020-11-16 NOTE — Telephone Encounter (Signed)
ATC patient-unable to leave vm due to mailbox being full.  Will call back.  

## 2020-11-16 NOTE — Telephone Encounter (Signed)
There is no nebulizer alternative for Breo although there are other alternatives in nebulizer form. Please make office visit so we can discuss with APP/me. Meanwhile he can continue albuterol nebs

## 2020-11-18 NOTE — Telephone Encounter (Signed)
ATC pt. VM box is full. Will try once more then send letter if cannot reach pt.

## 2020-11-19 ENCOUNTER — Encounter: Payer: Self-pay | Admitting: *Deleted

## 2020-11-19 ENCOUNTER — Other Ambulatory Visit: Payer: Self-pay

## 2020-11-19 ENCOUNTER — Encounter (HOSPITAL_COMMUNITY): Payer: Self-pay | Admitting: Cardiology

## 2020-11-19 ENCOUNTER — Ambulatory Visit (HOSPITAL_COMMUNITY)
Admission: RE | Admit: 2020-11-19 | Discharge: 2020-11-19 | Disposition: A | Discharge status: Home / Self Care | Payer: Medicare Other | Source: Ambulatory Visit | Attending: Cardiology | Admitting: Cardiology

## 2020-11-19 VITALS — BP 130/70 | HR 85 | Wt 224.0 lb

## 2020-11-19 DIAGNOSIS — I350 Nonrheumatic aortic (valve) stenosis: Secondary | ICD-10-CM | POA: Diagnosis not present

## 2020-11-19 DIAGNOSIS — I441 Atrioventricular block, second degree: Secondary | ICD-10-CM | POA: Diagnosis not present

## 2020-11-19 DIAGNOSIS — I5032 Chronic diastolic (congestive) heart failure: Secondary | ICD-10-CM | POA: Diagnosis present

## 2020-11-19 DIAGNOSIS — Z7982 Long term (current) use of aspirin: Secondary | ICD-10-CM | POA: Diagnosis not present

## 2020-11-19 DIAGNOSIS — L905 Scar conditions and fibrosis of skin: Secondary | ICD-10-CM | POA: Diagnosis not present

## 2020-11-19 DIAGNOSIS — Z950 Presence of cardiac pacemaker: Secondary | ICD-10-CM | POA: Diagnosis not present

## 2020-11-19 DIAGNOSIS — N183 Chronic kidney disease, stage 3 unspecified: Secondary | ICD-10-CM | POA: Diagnosis not present

## 2020-11-19 DIAGNOSIS — Z951 Presence of aortocoronary bypass graft: Secondary | ICD-10-CM | POA: Diagnosis not present

## 2020-11-19 DIAGNOSIS — Z79899 Other long term (current) drug therapy: Secondary | ICD-10-CM | POA: Diagnosis not present

## 2020-11-19 DIAGNOSIS — I2510 Atherosclerotic heart disease of native coronary artery without angina pectoris: Secondary | ICD-10-CM | POA: Insufficient documentation

## 2020-11-19 DIAGNOSIS — R002 Palpitations: Secondary | ICD-10-CM | POA: Insufficient documentation

## 2020-11-19 DIAGNOSIS — E785 Hyperlipidemia, unspecified: Secondary | ICD-10-CM | POA: Diagnosis not present

## 2020-11-19 DIAGNOSIS — E1122 Type 2 diabetes mellitus with diabetic chronic kidney disease: Secondary | ICD-10-CM | POA: Diagnosis not present

## 2020-11-19 DIAGNOSIS — Z87891 Personal history of nicotine dependence: Secondary | ICD-10-CM | POA: Insufficient documentation

## 2020-11-19 DIAGNOSIS — I130 Hypertensive heart and chronic kidney disease with heart failure and stage 1 through stage 4 chronic kidney disease, or unspecified chronic kidney disease: Secondary | ICD-10-CM | POA: Insufficient documentation

## 2020-11-19 DIAGNOSIS — M25569 Pain in unspecified knee: Secondary | ICD-10-CM | POA: Diagnosis not present

## 2020-11-19 DIAGNOSIS — Z953 Presence of xenogenic heart valve: Secondary | ICD-10-CM | POA: Diagnosis not present

## 2020-11-19 DIAGNOSIS — Z8616 Personal history of COVID-19: Secondary | ICD-10-CM | POA: Insufficient documentation

## 2020-11-19 DIAGNOSIS — M109 Gout, unspecified: Secondary | ICD-10-CM | POA: Insufficient documentation

## 2020-11-19 DIAGNOSIS — Z794 Long term (current) use of insulin: Secondary | ICD-10-CM | POA: Insufficient documentation

## 2020-11-19 DIAGNOSIS — G4733 Obstructive sleep apnea (adult) (pediatric): Secondary | ICD-10-CM | POA: Diagnosis not present

## 2020-11-19 DIAGNOSIS — G8929 Other chronic pain: Secondary | ICD-10-CM | POA: Insufficient documentation

## 2020-11-19 DIAGNOSIS — J439 Emphysema, unspecified: Secondary | ICD-10-CM | POA: Insufficient documentation

## 2020-11-19 LAB — BASIC METABOLIC PANEL
Anion gap: 8 (ref 5–15)
BUN: 35 mg/dL — ABNORMAL HIGH (ref 8–23)
CO2: 30 mmol/L (ref 22–32)
Calcium: 9.5 mg/dL (ref 8.9–10.3)
Chloride: 102 mmol/L (ref 98–111)
Creatinine, Ser: 1.97 mg/dL — ABNORMAL HIGH (ref 0.61–1.24)
GFR, Estimated: 34 mL/min — ABNORMAL LOW (ref >60)
Glucose, Bld: 114 mg/dL — ABNORMAL HIGH (ref 70–99)
Potassium: 4.2 mmol/L (ref 3.5–5.1)
Sodium: 140 mmol/L (ref 135–145)

## 2020-11-19 LAB — BRAIN NATRIURETIC PEPTIDE: B Natriuretic Peptide: 156.1 pg/mL — ABNORMAL HIGH (ref 0.0–100.0)

## 2020-11-19 MED ORDER — TORSEMIDE 20 MG PO TABS: ORAL_TABLET | ORAL | 3 refills | Status: DC

## 2020-11-19 MED ORDER — EMPAGLIFLOZIN 10 MG PO TABS: 10.0000 mg | ORAL_TABLET | Freq: Every day | ORAL | 6 refills | Status: DC

## 2020-11-19 NOTE — Patient Instructions (Signed)
IN 1 WEEK, Friday 11/26/20:    Start Jardiance 10 mg Daily  Decrease Torsemide to 40 mg (2 tabs) in AM and 20 mg (1 tab) in PM   Labs done today, your results will be available in MyChart, we will contact you for abnormal readings.  Your physician recommends that you schedule a follow-up appointment in: 3 months with an echocardiogram  If you have any questions or concerns before your next appointment please send [place] a message through [place] or call our office at [phone].    TO LEAVE A MESSAGE FOR THE NURSE SELECT OPTION 2, PLEASE LEAVE A MESSAGE INCLUDING: . YOUR NAME . DATE OF BIRTH . CALL BACK NUMBER . REASON FOR CALL**this is important as we prioritize the call backs  [place] AS LONG AS YOU CALL BEFORE 4:00 PM  At the [facility], you and your health needs are our priority. As part of our continuing mission to provide you with exceptional heart care, we have created designated Provider Care Teams. These Care Teams include your primary Cardiologist (physician) and Advanced Practice Providers (APPs- Physician Assistants and Nurse Practitioners) who all work together to provide you with the care you need, when you need it.   You may see any of the following providers on your designated Care Team at your next follow up: Marland Kitchen Dr Glori Bickers . Dr Loralie Champagne . Dr Vickki Muff . Darrick Grinder, NP . Lyda Jester, [place] . Audry Riles, PharmD   Please be sure to bring in all your medications bottles to every appointment.

## 2020-11-19 NOTE — Progress Notes (Signed)
Medication Samples have been provided to the patient.  Drug name: Jardiance       Strength: 10 mg        Qty: 2  LOT: [id number]  Exp.Date: 9/23  Dosing instructions: take 1 tab daily   The patient has been instructed regarding the correct time, dose, and frequency of taking this medication, including desired effects and most common side effects.   [name] 3:42 PM 11/19/2020

## 2020-11-19 NOTE — Telephone Encounter (Signed)
Tried calling the pt again and there was no answer and still unable to leave msg. Letter mailed.

## 2020-11-21 NOTE — Progress Notes (Signed)
ID:  [name], DOB 1941-03-14, MRN [id number]   Provider location: [facility] Type of Visit: Established patient   PCP:  [name], MD  HF Cardiologist:  Dr. [name]   History of Present Illness: [name] is a 80 y.o. male who has a history of ARDS/respiratory failure and tracheostomy in 07/2012 as well as DM, HTN, and COPD presented to the ER at [facility] with CHF in 10/2012. Patient had a prolonged hospitalization in 07/2012 with H1N1 influenza and pneumococcal PNA. This progressed to ARDS. He was intubated and ended up with tracheostomy. He had the trach removed. He also had a left empyema requiring chest tube, septic shock with elevated troponin, and AKI which resolved.  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   He was admitted again in 10/2012 from his nursing home with exertional dyspnea and orthopnea. He had gained 21 lbs. Echo at admission showed EF 35% with global hypokinesis that looked worse in the anteroseptal wall. He also was noted to have aortic stenosis rated as moderate to severe. He was diuresed and cathed, showing severe 3 vessel disease. He then had CABG-AVR (bioprosthetic valve).   He had a large AAA and underwent surgical repair in 07/2013.  He had AKI and Pseudomonas PNA post-operatively.  He was discharged to a nursing home. Echo in 1/15 showed EF 55-60% with mildly dilated and dysfunctional RV and a bioprosthetic aortic valve with mean gradient 32 mmHg (higher than expected).   Repeat limited echo in 08/2013 showed shower aortic valve mean gradient of 21 mmHg.   In 11/15, patient had a holter monitor placed for bradycardia.  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  Baseline bifascicular block.  His HR also was noted to drop into the 40s with exercise, suggesting exercise-associated heart block.  He underwent Medtronic-PCM placement in 1/16 by Dr Caryl Comes.   Echo in 10/16 showed fall in EF to 30% with mean aortic valve  gradient 21 mmHg.  Repeat echo in 2/17 showed EF 20-25%, mean aortic valve gradient 24 mmHg with moderately decreased RV systolic function.  Cardiolite in 10/16 showed inferior scar with no ischemia.  He is RV pacing almost all the time.  Low dose DSE was done in 2/17 to assess for significant bioprosthetic valve stenosis => mean gradient with dobutamine was only 31 mmHg, so suspect no more than moderate stenosis.  He had St Jude CRT upgrade in 3/17.  He decided against ICD.  Repeat echo post-CRT in 8/17 showed EF 50%, diffuse hypokinesis, bioprosthetic aortic valve with mean gradient 27 mmHg.  Echo in 9/18 showed EF 50-55%, bioprosthetic aortic valve with mean gradient 25 mmHg.  Echo in 10/19 showed EF 50-55%, bioprosthetic aortic valve with mean gradient 25 mmHg.   In 9/20, he was admitted with COVID-19 infection.    Echo in 3/21 showed EF 50-55% with mild LVH, mild RV enlargement with normal function, PASP 25, bioprosthetic aortic valve with mean gradient 24 and AVA 1.38 cm^2.     He is doing well symptomatically.  Using CPAP nightly. Weight down 4 lbs.  No dyspnea walking on flat ground.  No chest pain.  Elevates head of bed chronically.  Rare palpitations.  He has chronic knee pain.     St Jude device interrogation: Thoracic impedance stable, 98% BiV pacing, no AF  ECG (personally reviewed): NSR, BiV pacing  Labs (2/15): K 4, creatinine 1.6, BNP 166 Labs (3/15): Hgb 11.6, pro-BNP 1037, creatinine 1.76,  BUN 30, LDL 75, HDL 26 Labs (8/15): K 3.9, creatinine 1.55, HCT 40.6, LDL 71, HDL 27 Labs (1/16): K 4.3, Creatinine 1.6, HCT 42.9 Labs (2/17): K 4.0 Creatinine 1.64, TSH normal.  Labs (3/17): K 4.5, creatinine 1.49, HCT 42.9 Labs (4/17): K 4, creatinine 1.48, BNP 125 Labs (6/17): LDL 27, HDL 24, TGs 245 Labs (7/17): K 4.9, creatinine 1.71 Labs (8/17): K 4.3, creatinine 1.7, LDL 64, HDl 25, TGs 198 Lab (06/16/2016): K 4.9 Creatinine 1.84  Labs (10/2016): Creatinine 1.5 K 3.9  Labs (6/18): K  4.4, creatinine 1.57, BNP 55 Labs (7/18): LDL 58, HDL 22, creatinine 1.64 Labs (11/19): K 3.9, creatinine 1.5  Labs (9/20): K 4, creatinine 1.62, hgb 15.5 Labs (2/21): LDL 52, HDL 24, TGs 202, hgb 14.2, K 3.8, creatinine 1.6 Labs (3/21): K 3.5, creatinine 1.8 Labs (6/21): K 4, creatinine 1.68 Labs (9/21): LDL 48, TGs 174 Labs (2/22): hgb 14.6, K 3.6, creatinine 1.49  PMH: 1. PNA (H1N1 influenza + pneumococcus) in 2/40 complicated by respiratory failure/ARDS. Required tracheostomy, now weaned off. Had left empyema requiring chest tube.  Has developed post-infectious pulmonary fibrosis.  2. Prostate CA s/p radiation treatment. Has indwelling foley.  3. OSA: using CPAP 4. HTN  5. Hyperlipidemia  6. COPD: History of heavy smoking. PFTs (4/14) with mixed obstructive (COPD) and restrictive (post-ARDS) picture.  - CT chest 12/19 consistent with emphysema.  7. Type II diabetes  8. Anemia of chronic disease.  9. Ischemic cardiomyopathy:  - Echo (1/14) with severely dilated LV, EF 30-35%, diffuse hypokinesis worse in the anteroseptal wall, grade II diastolic dysfunction, mild MR, AS interpreted as "moderate to severe" with aortic valve mean gradient 23 mmHg.  - Echo (4/14) witih EF 35-40%, mid to apical anteroseptal akinesis, moderate to severe AS with mean gradient 33 mmHg, AVA 0.91 cm^2.  -Echo (5/14) post CABG showed EF 50%, anteroseptal hypokinesis, bioprosthetic aortic valve well-seated. - Echo (1/15) with EF 55-60%, mild LVH, mildly dilated RV with mildly decreased systolic function, D-shaped interventricular septum, bioprosthetic aortic valve with mean gardient 32 mmHg.   - Echo (3/15) with EF 55-60%, mild LVH, bioprosthetic aortic valve with mean gradient 21 mmHg (lower), no AI, RV moderately dilated with mild to moderately decreased systolic function.   - Echo (10/16) with EF 30%, bioprosthetic aortic valve with mean gradient 21 mmHg.   - Echo (2/17) with EF 20-25%, mild LVH, aortic valve  mean gradient 24 mmHg/peak 39 mmHg, RV moderately dilated with moderately decreased systolic function.  - St Jude CRT upgrade in 3/17 (did not want ICD).  - Echo (8/17) with EF 50%, diffuse hypokinesis, bioprosthetic aortic valve with mean gradient 27 mmHg, normal RV size with mildly decreased systolic function.   - AKI with lisinopril.  - Echo (9/18): EF 50-55%, bioprosthetic aortic valve with mean gradient 25 mmHg.  - Echo (10/19): EF 50-55%, bioprosthetic aortic valve with mean gradient 25 mmHg, normal RV size and systolic function.  - Echo (3/21): EF 50-55% with mild LVH, mild RV enlargement with normal function, PASP 25, bioprosthetic aortic valve with mean gradient 24 and AVA 1.38 cm^2.    10. Aortic stenosis: Moderate to severe by echo in 4/14. Bioprosthetic #21 [facility] Ease aortic valve replacement in 4/14.  Low dose DSE (2/17) showed that Mr Statz likely has no more than moderate bioprosthetic valve stenosis with mean gradient up to 31 mmHg with dobutamine.  11. CAD: LHC (4/14) with 95% pLAD, 70-80% ostial ramus, 70% mLCx, 90% pRCA, EF  35%.  - CABG 4/14 with LIMA-LAD, seq SVG-ramus and OM1, SVG-PDA.  - Cardiolite (10/16) with EF 42%, fixed inferior defect, no ischemia.   12. Carotid stenosis: Carotid dopplers (0/62) with 69-48% LICA stenosis.  Carotids (5/46) with 27-03% LICA stenosis. Carotids (5/00) with 93-81% LICA stenosis.  - Carotid dopplers (8/29): 93-71% LICA stenosis.  - Carotid dopplers (6/96): 78-93% LICA stenosis.  13. AAA: 5/14 CT showed > 6 cm AAA, also right iliac aneurysm. Not stent graft candidate.  7/14 CT showed 7 cm AAA.  Surgical repair 1/15.  14. CKD: AKI in 7/14 from contrast nephropathy.  AKI in 1/15 after AAA repair.  15. Contrast allergy.  16. ABIs 9/14 were normal.  17. Gout 18. 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  HR drops with exercise (likely exercise-induced heart block).  Medtronic dual chamber PPM placed in 2016.   Recent pacemaker interrogation showed complete heart block.  He is pacemaker-dependent, upgraded to CRT in 3/17.  19. COVID-19 PNA in 9/20  SH: Quit smoking in 1/14, married, no ETOH, lives in [place].   FH: No premature CAD  ROS: All systems reviewed and negative except as per HPI.    Current Outpatient Medications  Medication Sig Dispense Refill  . allopurinol (ZYLOPRIM) 300 MG tablet Take 300 mg by mouth daily.    Marland Kitchen amoxicillin (AMOXIL) 500 MG capsule TAKE 4 CAPS 1 HOUR PRIOR TO TREATMENT    . aspirin EC 81 MG tablet Take 81 mg by mouth daily.    Marland Kitchen atorvastatin (LIPITOR) 80 MG tablet TAKE 1 TABLET (80 MG TOTAL) BY MOUTH DAILY BEFORE BREAKFAST. 90 tablet 3  . bisoprolol (ZEBETA) 5 MG tablet TAKE 1/2 TABLETS (2.5 MG TOTAL) BY MOUTH DAILY. 45 tablet 3  . Blood Glucose Calibration (ONETOUCH VERIO) SOLN Use to calibrate one touch verio level 3 1 each 3  . calcitRIOL (ROCALTROL) 0.25 MCG capsule Take 0.25 mcg by mouth daily.     Marland Kitchen dextromethorphan-guaiFENesin (MUCINEX DM) 30-600 MG 12hr tablet Take 1 tablet by mouth 2 (two) times daily.    Marland Kitchen doxycycline (VIBRAMYCIN) 100 MG capsule Take 100 mg by mouth 2 (two) times daily. X 7 days    . empagliflozin (JARDIANCE) 10 MG TABS tablet Take 1 tablet (10 mg total) by mouth daily before breakfast. 30 tablet 6  . fexofenadine (ALLEGRA) 180 MG tablet Take 90 mg by mouth daily.     Marland Kitchen glucose blood (ONETOUCH VERIO) test strip Use to check blood sugar once a day. 100 strip 12  . Insulin Degludec (TRESIBA FLEXTOUCH) 200 UNIT/ML SOPN Inject 34 Units into the skin daily. 6 pen 5  . Insulin Pen Needle 31G X 4 MM MISC 1 each by Does not apply route daily. Use to inject insulin daily 100 each 2  . Omega-3 Fatty Acids (FISH OIL MAXIMUM STRENGTH PO) Take 1 capsule by mouth in the morning and at bedtime.     Marland Kitchen OZEMPIC, 1 MG/DOSE, 4 MG/3ML SOPN INJECT 1 MG INTO THE SKIN ONCE A WEEK 9 mL 1  . pantoprazole (PROTONIX) 40 MG tablet Take 40 mg by mouth at bedtime.     .  Potassium Chloride (KLOR-CON 10 PO) Take 2 tablets by mouth daily in the afternoon.    Derrill Memo ON 11/26/2020] torsemide (DEMADEX) 20 MG tablet Take 2 tablets (40 mg total) by mouth in the morning AND 1 tablet (20 mg total) every evening. 360 tablet 3   No current facility-administered medications  for this encounter.    Vitals:   11/19/20 1047  BP: 130/70  Pulse: 85  SpO2: 97%  Weight: 101.6 kg (224 lb)    Wt Readings from Last 3 Encounters:  11/19/20 101.6 kg (224 lb)  07/08/20 105.2 kg (232 lb)  03/11/20 103.4 kg (228 lb)   General: NAD Neck: JVP 7-8 cm, no thyromegaly or thyroid nodule.  Lungs: Clear to auscultation bilaterally with normal respiratory effort. CV: Nondisplaced PMI.  Heart regular S1/S2, no S3/S4, 2/6 early SEM RUSB.  Trace ankle edema.  No carotid bruit.  Normal pedal pulses.  Abdomen: Soft, nontender, no hepatosplenomegaly, no distention.  Skin: Intact without lesions or rashes.  Neurologic: Alert and oriented x 3.  Psych: Normal affect. Extremities: No clubbing or cyanosis.  HEENT: Normal.   ASSESSMENT & PLAN: 1. CAD: Status post CABG for 3VD.  Cardiolite in 10/16 with inferior scar, no ischemia.  No chest pain.  - Continue ASA, statin.   2. Chronic systolic CHF:  Echo in 9/47 showed EF down to 20-25%.  No ischemia on Cardiolite.  Constant RV pacing may have led to decline in EF.  We considered bioprosthetic valve stenosis as cause of fall in EF, but low gradient DSE did not suggest severe stenosis.  Echo 8/17 after CRT upgrade shows improved EF to 50%.  Echo in 3/21 showed EF 50-55%.  Weight down.  NYHA class II symptoms, not volume overloaded on exam.   - Continue bisoprolol 2.5 mg daily.   - AKI with lisinopril, will leave off.  - Add Jardiance 10 mg daily and decrease torsemide to 40 qam/20 qpm. BMET/BNP today and in 10 days.  - Echo at followup in 3 months.  3. 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 Suspect that intrinsic lung  disease (post-infectious pulmonary fibrosis) is a contributor to his dyspnea and hypoxemia. He is now off oxygen (just using CPAP at night).  He had emphysema noted on 12/19 CT.  - Follows with pulmonary.   4. Carotid stenosis: Followed at [facility]. 5. Hyperlipidemia: Good lipids in 9/21.   6. AAA: s/p surgical repair. 7. Status post bioprosthetic AVR: Gradient across the aortic valve has been elevated. There was some concern for possible low gradient severe bioprosthetic aortic stenosis, but low dose DSE in 2017 suggested that valvular stenosis was no more than moderate.  Mean gradient 24 mmHg across bioprosthetic valve on 3/21 echo, this is stable compared to prior echoes.    - He will need antibiotic prophylaxis with dental work.  - If bioprosthetic aortic valve stenosis worsens, he would be a candidate for TAVR.  8. CKD: Stage III. Check BMET today.  9. 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  S/p Medtronic PCM 1/16.   Patient is pacemaker-dependent with complete heart block.  He had upgrade to CRT in 3/17.   10. OSA: He is on CPAP.  Continue CPAP nightly.  11. Gout: on allopurinol.  Followup in 3 months with echo.   Signed, Loralie Champagne, MD  11/21/2020  [facility] [address] [facility] [place] [address] [phone] (office) [phone] (fax)

## 2020-11-22 NOTE — Progress Notes (Signed)
Remote pacemaker transmission.   

## 2020-12-03 ENCOUNTER — Telehealth (HOSPITAL_COMMUNITY): Payer: Self-pay | Admitting: Pharmacy Technician

## 2020-12-03 ENCOUNTER — Other Ambulatory Visit (HOSPITAL_COMMUNITY): Payer: Self-pay

## 2020-12-03 NOTE — Telephone Encounter (Signed)
Advanced Heart Failure Patient Advocate Encounter  The patient was started on Jardiance and is currently in the donut hole. His 30 day co-pay is $150. I called to make sure that the co-pay was affordable and assess financial assistance, patient's voicemail was full. No way to leave a message.   Charlann Boxer, CPhT

## 2020-12-24 ENCOUNTER — Telehealth: Payer: Self-pay | Admitting: Pulmonary Disease

## 2020-12-24 NOTE — Telephone Encounter (Signed)
Called patient. He did not answer. Left message for him to call back.

## 2020-12-24 NOTE — Telephone Encounter (Addendum)
Pt stated that he received a letter in the mail from our office in regards to his medication. Pt stated he didn't have the letter at hand near him to read it to me but knows it had something to do with his medication and was just calling us to speak more in regards to it.  Also, scheduled overdue 4 month f/u appt w/ Dr. Elsworth Soho for 02/22/2021.  Pls regard; [phone]

## 2020-12-27 NOTE — Telephone Encounter (Signed)
Rigoberto Noel, MD to [facility] Triage Pool      9:21 AM Note There is no nebulizer alternative for [facility] although there are other alternatives in nebulizer form. Please make office visit so we can discuss with APP/me. Meanwhile he can continue albuterol nebs     I spoke with the pt and notified of response per Dr Elsworth Soho Pt verbalized understanding  He states doing fine just using his albuterol inhaler, and he does not want sooner appt than already scheduled for 02/22/21. Nothing further needed

## 2020-12-27 NOTE — Telephone Encounter (Signed)
ATC, left vm for patient to return call.

## 2021-01-13 ENCOUNTER — Encounter: Payer: Self-pay | Admitting: Internal Medicine

## 2021-01-13 ENCOUNTER — Other Ambulatory Visit: Payer: Self-pay

## 2021-01-13 ENCOUNTER — Ambulatory Visit (INDEPENDENT_AMBULATORY_CARE_PROVIDER_SITE_OTHER): Payer: Medicare Other | Admitting: Internal Medicine

## 2021-01-13 VITALS — BP 128/82 | HR 80 | Ht 65.0 in | Wt 219.8 lb

## 2021-01-13 DIAGNOSIS — E785 Hyperlipidemia, unspecified: Secondary | ICD-10-CM | POA: Diagnosis not present

## 2021-01-13 DIAGNOSIS — E1159 Type 2 diabetes mellitus with other circulatory complications: Secondary | ICD-10-CM

## 2021-01-13 DIAGNOSIS — E1165 Type 2 diabetes mellitus with hyperglycemia: Secondary | ICD-10-CM | POA: Diagnosis not present

## 2021-01-13 DIAGNOSIS — E66812 Obesity, class 2: Secondary | ICD-10-CM

## 2021-01-13 DIAGNOSIS — Z6835 Body mass index (BMI) 35.0-35.9, adult: Secondary | ICD-10-CM

## 2021-01-13 LAB — POCT GLYCOSYLATED HEMOGLOBIN (HGB A1C): Hemoglobin A1C: 5.9 % — AB (ref 4.0–5.6)

## 2021-01-13 MED ORDER — TRESIBA FLEXTOUCH 200 UNIT/ML ~~LOC~~ SOPN
28.0000 [IU] | PEN_INJECTOR | Freq: Every day | SUBCUTANEOUS | Status: DC
Start: 2021-01-13 — End: 2021-06-17

## 2021-01-13 NOTE — Progress Notes (Signed)
Patient ID: [name], male   DOB: [birth year], 80 y.o.   MRN: [id number]   This visit occurred during the SARS-CoV-2 public health emergency.  Safety protocols were in place, including screening questions prior to the visit, additional usage of staff PPE, and extensive cleaning of exam room while observing appropriate contact time as indicated for disinfecting solutions.   HPI: [name] is a 80 y.o.-year-old male, referred by his cardiologist, Dr. 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  Last visit 6 months ago. He is here with his daughter.  Interim history: No increased urination, blurry vision, nausea, chest pain. He slipped and fell 2-3 weeks ago >> hit head >> no HA now. His knees sometimes give out >> uses a cane. Since last visit, he was started on Jardiance by cardiology.  He tolerates it well, without significantly increased urination.  Reviewed HbA1c levels: Lab Results  Component Value Date   HGBA1C 6.7 (A) 07/08/2020   HGBA1C 6.4 (A) 03/01/2020   HGBA1C 6.4 (A) 10/30/2019   HGBA1C 6.2 (A) 06/30/2019   HGBA1C 7.1 (A) 11/18/2018   HGBA1C 6.6 (A) 08/30/2018   HGBA1C 8.3 (A) 04/30/2018   HGBA1C 5.5 10/24/2012   HGBA1C 6.4 (H) 07/24/2012   Pt is on a regimen of: - Jardiance 10 mg before b'fast - added by cardiology 10/2020 - Tresiba 36 >> 26 >> 30 >> 46 >> 40 >> 34 units daily - Ozempic 0.5 >> 1 mg weekly -started 04/2018 -no GI side effects.  This is expensive for him. He was on Januvia 50 mg daily, but this did not help >> stopped 2018. He was on Jardiance.  He checks his sugars once a day: - am: 72, 105-134, 154 >> 105-130, 145 >> 80's, 91-138, 160 >> 80-121 - 2h after b'fast: n/c - before lunch:  148-181 >> n/c >> 130, 136 >> 99, 144, 185 >> 80 - 2h after lunch: n/c >> 126-183 >> n/c - before dinner:  111-168,  182 >> n/c >> 95-136 >> n/c >> 103 - 2h after dinner: 143-210 >> n/c >> 132, 175 >> 91, 146 >> 99-126 - bedtime: n/c >> 151 >> n/c - nighttime: n/c Lowest sugar was  95 ... >> 70s >> 80s >> 80; it is unclear at which level he has hypoglycemia awareness. Highest sugar was 256 ... >> 175 (grapes) >> 185 >> 200s.  Glucometer: One Touch Verio  Pt's meals are: - Breakfast: Banana, 2 clementines, different fruit in season, low-salt chips - Lunch: Meat, 2 veggies, toast, and sweet tea - Dinner: Sandwich (banana or meat, tomato), 1 Clementine - Snacks: Frequently after dinner He exercises on his recumbent bike.  -+ CKD, last BUN/creatinine:  Lab Results  Component Value Date   BUN 35 (H) 11/19/2020   BUN 39 (H) 03/11/2020   CREATININE 1.97 (H) 11/19/2020   CREATININE 1.79 (H) 03/11/2020  08/10/2020: glucose 81, BUN/creatinine 36/1.49, GFR 44, alkaline phosphatase 154 (40-129), ACR 179  -+ HL; last set of lipids: 08/10/2020: 101/138 (?  Unclear)/25/48 Lab Results  Component Value Date   CHOL 105 03/11/2020   HDL 22 (L) 03/11/2020   LDLCALC 48 03/11/2020   LDLDIRECT 61.5 05/15/2013   TRIG 174 (H) 03/11/2020   CHOLHDL 4.8 03/11/2020  On Lipitor 80 and fish oil.  His cardiologist also recommended Vascepa - samples for 6 weeks but he  could not afford it beyond that.  - last eye exam was in 2021: No DR.  Small cataracts.  -No numbness and tingling in his feet.  Pt has FH of DM in S, B, Father.  She also has HTN, OSA. He had Covid19 03/2019.  He completed his COVID vaccination and also had a flu shot.  Other labs reviewed: Labs from PCP (Dr. [name]) -08/10/2020: -Vitamin D 62.6 -BNP 1116 -CBC with differential normal  ROS: Constitutional: + weight loss + See HPI  I reviewed pt's medications, allergies, PMH, social hx, family hx, and changes were documented in the history of present illness. Otherwise, unchanged from my initial visit note.  Past Medical History:  Diagnosis  Date   AAA (abdominal aortic aneurysm) ([place])    5/14 CT showed > 6 cm AAA, also right iliac aneurysm. Not stent graft candidate.    Anemia of chronic disease    Anxiety    pt. admits that he has anxiety at times    Aortic stenosis    Moderate to severe by echo in 4/14. Bioprosthetic #21 [facility] Ease aortic valve replacement in 4/14.    Arthritis    CAD (coronary artery disease)    a. 4/14 CABG: LIMA-LAD, seq SVG-ramus & OM1, SVG-PDA   Carotid arterial disease (HCC)    Carotid dopplers (5/72) with 62-03% LICA stenosis.    CHF (congestive heart failure) ([place]) 07/2012; 10/17/2012   Chronic kidney disease    increased creatinine recently - being followed, near kidney failure fr. contrast dye    Chronic systolic heart failure ([place])    a. 1/14 ECHO: sev dil LV, EF30-35%, diff HK, mild MR, AS severe, AV grad 35 b. 8/14 ECHO: EF 55-60%, mild biopros AV sten mn grad. 25, RV mild dil, RA mild dil   Complication of anesthesia    during last surgery had to be given special medicine b/c "something dropped" during surgery    COPD (chronic obstructive pulmonary disease) ([place])    a.  History of heavy smoking. PFTs (4/14) with mixed obstructive (COPD) and restrictive (post-ARDS) picture.    Diabetes mellitus without complication ([place])    Esophageal reflux    History of blood transfusion    "lots since January" (10/17/2012)   Hyperlipidemia    Hypertension    Ischemic cardiomyopathy    Ischemic cardiomyopathy    a. 4/14 LHC: pLAD 95, ost ramus 70-80, mLCx 90, EF 30%   OSA (obstructive sleep apnea)    on cpap- every sleep time.    Pneumonia 07/2012   PNA (H1N1 influenza + pneumococcus) in 5/59 complicated by respiratory failure/ARDS. Required tracheostomy, now weaned off. Had left empyema requiring chest tube.    Pneumonia due to COVID-19 virus 03/22/2019   Prediabetes    Prostate cancer [facility])     s/p radiation treatment. - (PT. DENIES)Has indwelling foley.    Shortness of breath    still goes  deer hunting by himself   Past Surgical History:  Procedure Laterality Date   ABDOMINAL AORTIC ANEURYSM REPAIR N/A 07/10/2013   Procedure: Resection and Graftiong of perirenal AAA; Insertion 14 x 8 Hemashield Graft Aorta to Left Common Iliac and to Right Common Femoral Artery With Ligastion of Right External and Interanl Iliac Artery;  Surgeon: Mal Misty, MD;  Location: [place];  Service: Vascular;  Laterality: N/A;   ANAL FISSURE REPAIR  2008   AORTIC VALVE REPLACEMENT N/A 10/25/2012   Procedure: AORTIC VALVE REPLACEMENT (AVR);  Surgeon: Remo Lipps  Chaya Jan, MD;  Location: [place];  Service: Open Heart Surgery;  Laterality: N/A;   CARDIAC VALVE REPLACEMENT     CORONARY ARTERY BYPASS GRAFT N/A 10/25/2012   Procedure: CORONARY ARTERY BYPASS GRAFTING (CABG);  Surgeon: Melrose Nakayama, MD;  Location: [place];  Service: Open Heart Surgery;  Laterality: N/A;  times 4 using left internal mammary artery and endoscopically harvested bilateral saphenous vein    EP IMPLANTABLE DEVICE N/A 09/22/2015   Procedure: BiV Upgrade;  Surgeon: Deboraha Sprang, MD;  Location: [place] CV LAB;  Service: Cardiovascular;  Laterality: N/A;   INTRAOPERATIVE TRANSESOPHAGEAL ECHOCARDIOGRAM N/A 10/25/2012   Procedure: INTRAOPERATIVE TRANSESOPHAGEAL ECHOCARDIOGRAM;  Surgeon: Melrose Nakayama, MD;  Location: [place];  Service: Open Heart Surgery;  Laterality: N/A;   LEFT AND RIGHT HEART CATHETERIZATION WITH CORONARY ANGIOGRAM N/A 10/22/2012   Procedure: LEFT AND RIGHT HEART CATHETERIZATION WITH CORONARY ANGIOGRAM;  Surgeon: Larey Dresser, MD;  Location: [facility] CATH LAB;  Service: Cardiovascular;  Laterality: N/A;   NASAL FRACTURE SURGERY  1970's   PACEMAKER REVISION  09/22/2015   pacemaker upgrade    PERMANENT PACEMAKER INSERTION N/A 07/15/2014   Procedure: PERMANENT PACEMAKER INSERTION;  Surgeon: Deboraha Sprang, MD;  Location: [facility] CATH LAB;  Service: Cardiovascular;  Laterality: N/A;   TRACHEOSTOMY  07/2012   TRACHEOSTOMY  CLOSURE  08/2012   TRANSURETHRAL MICROWAVE THERAPY  10/15/2012   Social History   Socioeconomic History   Marital status: Married    Spouse name: Not on file   Number of children:  2   Years of education: Not on file   Highest education level: Not on file  Occupational History    Retired  Scientist, product/process development strain: Not on file   Food insecurity:    Worry: Not on file    Inability: Not on file   Transportation needs:    Medical: Not on file    Non-medical: Not on file  Tobacco Use   Smoking status: Former Smoker    Packs/day: 2.00    Years: 58.00    Pack years: 116.00    Types: Cigarettes    Last attempt to quit: 07/20/2012    Years since quitting: 5.7   Smokeless tobacco: Never Used  Substance and Sexual Activity   Alcohol use: No    Alcohol/week:  Beer once or twice a year: 2 drinks    Comment: 10/17/2012 "years since I've had a drink; never had problem with it"   Drug use: No   Sexual activity: Not Currently   Current Outpatient Medications on File Prior to Visit  Medication Sig Dispense Refill   allopurinol (ZYLOPRIM) 300 MG tablet Take 300 mg by mouth daily.     amoxicillin (AMOXIL) 500 MG capsule TAKE 4 CAPS 1 HOUR PRIOR TO TREATMENT     aspirin EC 81 MG tablet Take 81 mg by mouth daily.     atorvastatin (LIPITOR) 80 MG tablet TAKE 1 TABLET (80 MG TOTAL) BY MOUTH DAILY BEFORE BREAKFAST. 90 tablet 3   bisoprolol (ZEBETA) 5 MG tablet TAKE 1/2 TABLETS (2.5 MG TOTAL) BY MOUTH DAILY. 45 tablet 3   Blood Glucose Calibration (ONETOUCH VERIO) SOLN Use to calibrate one touch verio level 3 1 each 3   calcitRIOL (ROCALTROL) 0.25 MCG capsule Take 0.25 mcg by mouth daily.      dextromethorphan-guaiFENesin (MUCINEX DM) 30-600 MG 12hr tablet Take 1 tablet by mouth 2 (two) times daily.     doxycycline (VIBRAMYCIN) 100  MG capsule Take 100 mg by mouth 2 (two) times daily. X 7 days     empagliflozin (JARDIANCE) 10 MG TABS tablet Take 1 tablet (10 mg total) by mouth daily  before breakfast. 30 tablet 6   fexofenadine (ALLEGRA) 180 MG tablet Take 90 mg by mouth daily.      glucose blood (ONETOUCH VERIO) test strip Use to check blood sugar once a day. 100 strip 12   Insulin Degludec (TRESIBA FLEXTOUCH) 200 UNIT/ML SOPN Inject 34 Units into the skin daily. 6 pen 5   Insulin Pen Needle 31G X 4 MM MISC 1 each by Does not apply route daily. Use to inject insulin daily 100 each 2   Omega-3 Fatty Acids (FISH OIL MAXIMUM STRENGTH PO) Take 1 capsule by mouth in the morning and at bedtime.      OZEMPIC, 1 MG/DOSE, 4 MG/3ML SOPN INJECT 1 MG INTO THE SKIN ONCE A WEEK 9 mL 1   pantoprazole (PROTONIX) 40 MG tablet Take 40 mg by mouth at bedtime.      Potassium Chloride (KLOR-CON 10 PO) Take 2 tablets by mouth daily in the afternoon.     torsemide (DEMADEX) 20 MG tablet Take 2 tablets (40 mg total) by mouth in the morning AND 1 tablet (20 mg total) every evening. 360 tablet 3   No current facility-administered medications on file prior to visit.   Allergies  Allergen Reactions   Iodinated Diagnostic Agents Other (See Comments)    Decreased kidney function   Omnipaque [Iohexol] Other (See Comments)    Decreased kidney function   Primaxin [Imipenem] Hives   Cephalosporins Rash    Blisters    Lisinopril Nausea And Vomiting    Drops blood pressure    Clindamycin/Lincomycin Swelling and Rash   Family History  Problem Relation Age of Onset   Cancer Mother    Heart disease Father    Congestive Heart Failure Other    Heart disease Other    Also,  hyperlipidemia, heart disease, hypertension and brother. Thyroid disease and cancer in mother.  PE: BP 128/82 (BP Location: Left Arm, Patient Position: Sitting, Cuff Size: Normal)   Pulse 80   Ht 5\' 5"  (1.651 m)   Wt 219 lb 12.8 oz (99.7 kg)   SpO2 96%   BMI 36.58 kg/m  Wt Readings from Last 3 Encounters:  01/13/21 219 lb 12.8 oz (99.7 kg)  11/19/20 224 lb (101.6 kg)  07/08/20 232 lb (105.2 kg)   Constitutional:  overweight, in NAD Eyes: PERRLA, EOMI, no exophthalmos ENT: moist mucous membranes, no thyromegaly, no cervical lymphadenopathy Cardiovascular: RRR, No MRG Respiratory: CTA B Gastrointestinal: abdomen soft, NT, ND, BS+ Musculoskeletal: no deformities, strength intact in all 4 Skin: moist, warm, + rash on forearm- chronic ecchymoses Neurological: + Mild tremor with outstretched hands, DTR normal in all 4  ASSESSMENT: 1. 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 HL  3.  Obesity class II  PLAN:  1. Patient with longstanding history uncontrolled type 2 diabetes, on basal insulin and weekly GLP-1 receptor agonist.  We did not add an SGLT2 inhibitor in the past due to poor kidney function.  This would have been ideal in the setting of CHF and CKD. -At last visit, sugars were at goal in the morning and little more variable later in the day, but mostly at goal.  The  visit was immediately the holidays and he was already planning to go back to the previous diet.  He was not exercising but he was planning to restart using his recumbent bike, which I strongly advised him to do.  We did not change the regimen otherwise.  HbA1c at that time was slightly higher, at 6.7%. -At this visit, all of his blood sugars are at goal in the last 2 weeks.  He had a high blood sugar in the 200s since last visit, but otherwise, his sugars improved especially after adding Jardiance.  We will continue this.  We can start decreasing his [place] dose.  For now I advised him to decrease the dose from 34 to 28 units. - I suggested to:  Patient Instructions  Please continue: - Jardiance 10 mg before b'fast - Ozempic 1 mg weekly  Please decrease: - Tresiba U200 28 units daily  Please return in 4 months with your sugar log.   - we checked his HbA1c: 5.9% (lower) - advised to check sugars at  different times of the day - 1x a day, rotating check times - advised for yearly eye exams >> he is UTD - return to clinic in 4 months  2. HL -Reviewed latest lipid panel from 08/10/2020: LDL at goal, HDL low: 101/138 (?  Unclear)/25/48 -Continues Lipitor 80 mg daily and fish oil without side effects  3.  Obesity class 2 -We will continue Ozempic which should also help with weight loss.  He is now also on Jardiance, which should also help with weight loss. -He used to exercise on the recumbent bike >> at last visit I advised him to restart -He continues to work on improving diet. -He lost 13 pounds since last visit.  Philemon Kingdom, MD PhD [facility] Endocrinology

## 2021-01-13 NOTE — Patient Instructions (Addendum)
Please continue: - Jardiance 10 mg before b'fast - Ozempic 1 mg weekly  Please decrease: - Tresiba U200 28 units daily  Please return in 4 months with your sugar log.

## 2021-01-31 ENCOUNTER — Other Ambulatory Visit: Payer: Self-pay | Admitting: *Deleted

## 2021-01-31 DIAGNOSIS — Z87891 Personal history of nicotine dependence: Secondary | ICD-10-CM

## 2021-02-04 ENCOUNTER — Ambulatory Visit (INDEPENDENT_AMBULATORY_CARE_PROVIDER_SITE_OTHER): Payer: Medicare Other

## 2021-02-04 DIAGNOSIS — I442 Atrioventricular block, complete: Secondary | ICD-10-CM | POA: Diagnosis not present

## 2021-02-07 LAB — CUP PACEART REMOTE DEVICE CHECK
Battery Remaining Longevity: 24 mo
Battery Remaining Percentage: 26 %
Battery Voltage: 2.9 V
Brady Statistic AP VP Percent: 1 %
Brady Statistic AP VS Percent: 1 %
Brady Statistic AS VP Percent: 97 %
Brady Statistic AS VS Percent: 1.2 %
Brady Statistic RA Percent Paced: 1 %
Date Time Interrogation Session: 20220805024500
Implantable Lead Implant Date: 20160113
Implantable Lead Implant Date: 20160113
Implantable Lead Implant Date: 20170322
Implantable Lead Location: 753858
Implantable Lead Location: 753859
Implantable Lead Location: 753860
Implantable Lead Model: 5076
Implantable Lead Model: 5076
Implantable Pulse Generator Implant Date: 20170322
Lead Channel Impedance Value: 390 Ohm
Lead Channel Impedance Value: 460 Ohm
Lead Channel Impedance Value: 600 Ohm
Lead Channel Pacing Threshold Amplitude: 0.5 V
Lead Channel Pacing Threshold Amplitude: 0.625 V
Lead Channel Pacing Threshold Amplitude: 1.125 V
Lead Channel Pacing Threshold Pulse Width: 0.4 ms
Lead Channel Pacing Threshold Pulse Width: 0.4 ms
Lead Channel Pacing Threshold Pulse Width: 0.4 ms
Lead Channel Sensing Intrinsic Amplitude: 12 mV
Lead Channel Sensing Intrinsic Amplitude: 3.1 mV
Lead Channel Setting Pacing Amplitude: 1.625
Lead Channel Setting Pacing Amplitude: 2.125
Lead Channel Setting Pacing Amplitude: 2.5 V
Lead Channel Setting Pacing Pulse Width: 0.4 ms
Lead Channel Setting Pacing Pulse Width: 0.4 ms
Lead Channel Setting Sensing Sensitivity: 5 mV
Pulse Gen Model: 3262
Pulse Gen Serial Number: 7838612

## 2021-02-11 ENCOUNTER — Telehealth: Payer: Self-pay | Admitting: Acute Care

## 2021-02-11 NOTE — Telephone Encounter (Signed)
Patient called in to the office today asking about scheduling his LCS CT   [name] did you find a work around for this issue. see note below Vincent Beckers, RN  [name] I tried to put in a new order for him I got a red stop due to his age. Not sure why I can order for some patients over 77 and some I cant. I never know until I try to place the order.  [name]

## 2021-02-15 NOTE — Telephone Encounter (Signed)
[name], pt has Medicare with BCBS Medicare supplement. When I tried to enter to LDCT order I received a red stop and couldn't proceed with the order. I have tried with other patients that are over 8 and I have been able to complete the order but they may have had a different Medicare plan than what this pt has.

## 2021-02-22 ENCOUNTER — Other Ambulatory Visit: Payer: Self-pay

## 2021-02-22 ENCOUNTER — Ambulatory Visit (INDEPENDENT_AMBULATORY_CARE_PROVIDER_SITE_OTHER): Payer: Medicare Other | Admitting: Pulmonary Disease

## 2021-02-22 ENCOUNTER — Encounter: Payer: Self-pay | Admitting: Pulmonary Disease

## 2021-02-22 DIAGNOSIS — G4733 Obstructive sleep apnea (adult) (pediatric): Secondary | ICD-10-CM

## 2021-02-22 DIAGNOSIS — J432 Centrilobular emphysema: Secondary | ICD-10-CM | POA: Diagnosis not present

## 2021-02-22 MED ORDER — TRELEGY ELLIPTA 100-62.5-25 MCG/INH IN AEPB: 1.0000 | INHALATION_SPRAY | Freq: Every day | RESPIRATORY_TRACT | 0 refills | Status: DC

## 2021-02-22 MED ORDER — ALBUTEROL SULFATE HFA 108 (90 BASE) MCG/ACT IN AERS: 2.0000 | INHALATION_SPRAY | Freq: Four times a day (QID) | RESPIRATORY_TRACT | 2 refills | Status: DC | PRN

## 2021-02-22 NOTE — Assessment & Plan Note (Signed)
Sample of Trelegy 100 -once daily to take instead of Breo Prescription for albuterol MDI 2 puffs every 6 hours as needed for shortness of breath -this would be a rescue inhaler  Use nebulizer only as needed

## 2021-02-22 NOTE — Progress Notes (Signed)
   Subjective:    Patient ID: [name], male    DOB: 1941-03-26, 80 y.o.   MRN: [id number]  HPI  80 yo ex-heavy smoker for FU of COPD & OSA. He smoked 2-3 PPD until he quit in 07/2012     PMH-  CABG-AVR (bioprosthetic valve), DM-2 , ischemic cardiomyopathy   hospitalized with Covid pneumonia 03/2019 >> discharged on oxygen.   He states that Memory Dance does not last all day and he gets short of breath and feels congested towards evening, he then takes an albuterol nebulizer He wonders if there is an alternative. We reviewed low-dose CT scanning from 2021, it seems he has aged out of the screening program He does seem to have some memory issues, has cardiology follow-up this week. Compliant with CPAP machine, denies any problems with mask or pressure  Significant tests/ events reviewed LDCT 01/2020 >> stable nodule, calcified granuloma in the lingula LDCT 06/2018 >> RADS 2, moderate emphysema, right lower lobe 2.6 mm nodule   10/23/12 PFTs reveal severe obstructive / restrictive disease & severely reduced DLCO.    PFT 01/21/13 >FEV1 58%, ratio 58, +25% change after BD , DLCO 38 .   07/2012  H1N1 influenza and pneumococcal PNA/ ARDS requiring tracheostomy. He also had a left empyema requiring chest tube, septic shock and AKI which resolved.   Review of Systems neg for any significant sore throat, dysphagia, itching, sneezing, nasal congestion or excess/ purulent secretions, fever, chills, sweats, unintended wt loss, pleuritic or exertional cp, hempoptysis, orthopnea pnd or change in chronic leg swelling. Also denies presyncope, palpitations, heartburn, abdominal pain, nausea, vomiting, diarrhea or change in bowel or urinary habits, dysuria,hematuria, rash, arthralgias, visual complaints, headache, numbness weakness or ataxia.     Objective:   Physical Exam  Gen. Pleasant, obese, in no distress ENT - no lesions, no post nasal drip Neck: No JVD, no thyromegaly, no carotid bruits Lungs:  no use of accessory muscles, no dullness to percussion, decreased without rales or rhonchi  Cardiovascular: Rhythm regular, heart sounds  normal, no murmurs or gallops, no peripheral edema Musculoskeletal: No deformities, no cyanosis or clubbing , no tremors       Assessment & Plan:

## 2021-02-22 NOTE — Addendum Note (Signed)
Addended by: Coralie Keens on: 02/22/2021 04:35 PM   Modules accepted: Orders

## 2021-02-22 NOTE — Patient Instructions (Addendum)
Sample of Trelegy 100 -once daily to take instead of Breo Prescription for albuterol MDI 2 puffs every 6 hours as needed for shortness of breath -this would be a rescue inhaler  Use nebulizer only as needed

## 2021-02-22 NOTE — Assessment & Plan Note (Signed)
Weight loss encouraged, compliance with goal of at least 4-6 hrs every night is the expectation. Advised against medications with sedative side effects Cautioned against driving when sleepy - understanding that sleepiness will vary on a day to day basis  

## 2021-02-23 ENCOUNTER — Ambulatory Visit (HOSPITAL_BASED_OUTPATIENT_CLINIC_OR_DEPARTMENT_OTHER)
Admission: RE | Admit: 2021-02-23 | Discharge: 2021-02-23 | Disposition: A | Discharge status: Home / Self Care | Payer: Medicare Other | Source: Ambulatory Visit | Attending: Cardiology | Admitting: Cardiology

## 2021-02-23 ENCOUNTER — Ambulatory Visit (HOSPITAL_COMMUNITY)
Admission: RE | Admit: 2021-02-23 | Discharge: 2021-02-23 | Disposition: A | Discharge status: Home / Self Care | Payer: Medicare Other | Source: Ambulatory Visit | Attending: Internal Medicine | Admitting: Internal Medicine

## 2021-02-23 ENCOUNTER — Encounter (HOSPITAL_COMMUNITY): Payer: Self-pay | Admitting: Cardiology

## 2021-02-23 ENCOUNTER — Telehealth: Payer: Self-pay | Admitting: Internal Medicine

## 2021-02-23 VITALS — BP 112/70 | HR 78 | Wt 216.4 lb

## 2021-02-23 DIAGNOSIS — Z951 Presence of aortocoronary bypass graft: Secondary | ICD-10-CM | POA: Diagnosis not present

## 2021-02-23 DIAGNOSIS — G4733 Obstructive sleep apnea (adult) (pediatric): Secondary | ICD-10-CM | POA: Diagnosis not present

## 2021-02-23 DIAGNOSIS — Z8616 Personal history of COVID-19: Secondary | ICD-10-CM | POA: Diagnosis not present

## 2021-02-23 DIAGNOSIS — Z8679 Personal history of other diseases of the circulatory system: Secondary | ICD-10-CM | POA: Diagnosis not present

## 2021-02-23 DIAGNOSIS — I2510 Atherosclerotic heart disease of native coronary artery without angina pectoris: Secondary | ICD-10-CM | POA: Diagnosis not present

## 2021-02-23 DIAGNOSIS — J439 Emphysema, unspecified: Secondary | ICD-10-CM | POA: Diagnosis not present

## 2021-02-23 DIAGNOSIS — Z87891 Personal history of nicotine dependence: Secondary | ICD-10-CM | POA: Insufficient documentation

## 2021-02-23 DIAGNOSIS — L905 Scar conditions and fibrosis of skin: Secondary | ICD-10-CM | POA: Diagnosis not present

## 2021-02-23 DIAGNOSIS — Z79899 Other long term (current) drug therapy: Secondary | ICD-10-CM | POA: Insufficient documentation

## 2021-02-23 DIAGNOSIS — I130 Hypertensive heart and chronic kidney disease with heart failure and stage 1 through stage 4 chronic kidney disease, or unspecified chronic kidney disease: Secondary | ICD-10-CM | POA: Diagnosis not present

## 2021-02-23 DIAGNOSIS — N183 Chronic kidney disease, stage 3 unspecified: Secondary | ICD-10-CM | POA: Diagnosis not present

## 2021-02-23 DIAGNOSIS — Z7982 Long term (current) use of aspirin: Secondary | ICD-10-CM | POA: Insufficient documentation

## 2021-02-23 DIAGNOSIS — Z794 Long term (current) use of insulin: Secondary | ICD-10-CM | POA: Diagnosis not present

## 2021-02-23 DIAGNOSIS — I5032 Chronic diastolic (congestive) heart failure: Secondary | ICD-10-CM | POA: Diagnosis not present

## 2021-02-23 DIAGNOSIS — E7849 Other hyperlipidemia: Secondary | ICD-10-CM | POA: Diagnosis not present

## 2021-02-23 DIAGNOSIS — I6523 Occlusion and stenosis of bilateral carotid arteries: Secondary | ICD-10-CM | POA: Diagnosis not present

## 2021-02-23 DIAGNOSIS — E1122 Type 2 diabetes mellitus with diabetic chronic kidney disease: Secondary | ICD-10-CM | POA: Diagnosis not present

## 2021-02-23 DIAGNOSIS — E785 Hyperlipidemia, unspecified: Secondary | ICD-10-CM | POA: Insufficient documentation

## 2021-02-23 DIAGNOSIS — M109 Gout, unspecified: Secondary | ICD-10-CM | POA: Insufficient documentation

## 2021-02-23 DIAGNOSIS — I350 Nonrheumatic aortic (valve) stenosis: Secondary | ICD-10-CM | POA: Diagnosis not present

## 2021-02-23 DIAGNOSIS — Z953 Presence of xenogenic heart valve: Secondary | ICD-10-CM | POA: Insufficient documentation

## 2021-02-23 DIAGNOSIS — Z09 Encounter for follow-up examination after completed treatment for conditions other than malignant neoplasm: Secondary | ICD-10-CM | POA: Insufficient documentation

## 2021-02-23 DIAGNOSIS — Z950 Presence of cardiac pacemaker: Secondary | ICD-10-CM | POA: Insufficient documentation

## 2021-02-23 LAB — BASIC METABOLIC PANEL WITH GFR
Anion gap: 9 (ref 5–15)
BUN: 40 mg/dL — ABNORMAL HIGH (ref 8–23)
CO2: 26 mmol/L (ref 22–32)
Calcium: 9.2 mg/dL (ref 8.9–10.3)
Chloride: 107 mmol/L (ref 98–111)
Creatinine, Ser: 2.12 mg/dL — ABNORMAL HIGH (ref 0.61–1.24)
GFR, Estimated: 31 mL/min — ABNORMAL LOW
Glucose, Bld: 91 mg/dL (ref 70–99)
Potassium: 3 mmol/L — ABNORMAL LOW (ref 3.5–5.1)
Sodium: 142 mmol/L (ref 135–145)

## 2021-02-23 LAB — LIPID PANEL
Cholesterol: 105 mg/dL (ref 0–200)
HDL: 26 mg/dL — ABNORMAL LOW (ref >40)
LDL Cholesterol: 64 mg/dL (ref 0–99)
Total CHOL/HDL Ratio: 4 RATIO
Triglycerides: 77 mg/dL (ref <150)
VLDL: 15 mg/dL (ref 0–40)

## 2021-02-23 LAB — BRAIN NATRIURETIC PEPTIDE: B Natriuretic Peptide: 310.2 pg/mL — ABNORMAL HIGH (ref 0.0–100.0)

## 2021-02-23 LAB — ECHOCARDIOGRAM COMPLETE
AR max vel: 2.2 cm2
AV Area VTI: 2.19 cm2
AV Area mean vel: 2.04 cm2
AV Mean grad: 21 mmHg
AV Peak grad: 33.4 mmHg
Ao pk vel: 2.89 m/s
Area-P 1/2: 3.37 cm2
Calc EF: -8.7 %
S' Lateral: 4.5 cm
Single Plane A2C EF: -14.1 %
Single Plane A4C EF: 47.1 %

## 2021-02-23 MED ORDER — SPIRONOLACTONE 25 MG PO TABS: 12.5000 mg | ORAL_TABLET | Freq: Every day | ORAL | 6 refills | Status: DC

## 2021-02-23 MED ORDER — ACETAMINOPHEN 500 MG PO TABS: 500.0000 mg | ORAL_TABLET | Freq: Four times a day (QID) | ORAL | 0 refills | Status: AC | PRN

## 2021-02-23 MED ORDER — FLUCONAZOLE 150 MG PO TABS: 150.0000 mg | ORAL_TABLET | Freq: Every day | ORAL | 0 refills | Status: AC

## 2021-02-23 NOTE — Telephone Encounter (Signed)
T, Please advise him to decrease the insulin dose even further, to 20 units daily.  If sugars continue to decrease, he can reduce it further, to 16 units.  However the rest of his sugars doing?

## 2021-02-23 NOTE — Progress Notes (Signed)
  Echocardiogram 2D Echocardiogram has been performed.  [name] 02/23/2021, 11:13 AM

## 2021-02-23 NOTE — Telephone Encounter (Signed)
Pt is experiencing low blood sugars in the morning. Blood sugars have been from 44-47 for the past three days in the morning. 02/23/2021 this morning it was 115. Pt would like a call back. If nurse gets voicemail. Pt states that can leave a detailed voicemail.  Pt's wife phone number [phone]

## 2021-02-23 NOTE — Patient Instructions (Addendum)
Take Fluconazole 150 mg Daily FOR 3 DAYS ONLY  Start Spironolactone 12.5 mg (1/2 tab) Daily  Take Tylenol for pain, DO NOT TAKE IBUPROFEN   Labs done today, your results will be available in MyChart, we will contact you for abnormal readings.  Your physician recommends that you return for lab work in: 1-2 weeks, we have given you a prescription to have this done locally  Your physician has requested that you have a carotid duplex. This test is an ultrasound of the carotid arteries in your neck. It looks at blood flow through these arteries that supply the brain with blood. Allow one hour for this exam. There are no restrictions or special instructions. THEY WILL CALL YOU TO SCHEDULE THIS  Your physician recommends that you schedule a follow-up appointment in: 3 months  If you have any questions or concerns before your next appointment please send [place] a message through [place] or call our office at [phone].    TO LEAVE A MESSAGE FOR THE NURSE SELECT OPTION 2, PLEASE LEAVE A MESSAGE INCLUDING: YOUR NAME DATE OF BIRTH CALL BACK NUMBER REASON FOR CALL**this is important as we prioritize the call backs  YOU WILL RECEIVE A CALL BACK THE SAME DAY AS LONG AS YOU CALL BEFORE 4:00 PM  At the [facility], you and your health needs are our priority. As part of our continuing mission to provide you with exceptional heart care, we have created designated Provider Care Teams. These Care Teams include your primary Cardiologist (physician) and Advanced Practice Providers (APPs- Physician Assistants and Nurse Practitioners) who all work together to provide you with the care you need, when you need it.   You may see any of the following providers on your designated Care Team at your next follow up: Dr Glori Bickers Dr Loralie Champagne Dr Patrice Paradise, NP Lyda Jester, [place] Ginnie Smart Audry Riles, PharmD   Please be sure to bring in all your medications  bottles to every appointment.

## 2021-02-23 NOTE — Telephone Encounter (Signed)
Called and sent pt Mychart message advising to decrease the insulin dose even further, to 20 units daily.  If sugars continue to decrease, he can reduce it further, to 16 units. Pt advised to monitor sugars and follow up in a few days.

## 2021-02-23 NOTE — Progress Notes (Signed)
ID:  [name], DOB [birth year], MRN [id number]   Provider location: [place] Type of Visit: Established patient   PCP:  [name], MD  HF Cardiologist:  Dr. [name]   History of Present Illness: [name] is a 80 y.o. male who has a history of ARDS/respiratory failure and tracheostomy in 07/2012 as well as DM, HTN, and COPD presented to the ER at [place] with CHF in 10/2012. Patient had a prolonged hospitalization in 07/2012 with H1N1 influenza and pneumococcal PNA. This progressed to ARDS. He was intubated and ended up with tracheostomy. He had the trach removed. He also had a left empyema requiring chest tube, septic shock with elevated troponin, and AKI which resolved.  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   He was admitted again in 10/2012 from his nursing home with exertional dyspnea and orthopnea. He had gained 21 lbs. Echo at admission showed EF 35% with global hypokinesis that looked worse in the anteroseptal wall. He also was noted to have aortic stenosis rated as moderate to severe. He was diuresed and cathed, showing severe 3 vessel disease. He then had CABG-AVR (bioprosthetic valve).   He had a large AAA and underwent surgical repair in 07/2013.  He had AKI and Pseudomonas PNA post-operatively.  He was discharged to a nursing home. Echo in 1/15 showed EF 55-60% with mildly dilated and dysfunctional RV and a bioprosthetic aortic valve with mean gradient 32 mmHg (higher than expected).   Repeat limited echo in 08/2013 showed shower aortic valve mean gradient of 21 mmHg.   In 11/15, patient had a holter monitor placed for bradycardia.  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  Baseline bifascicular block.  His HR also was noted to drop into the 40s with exercise, suggesting exercise-associated heart block.  He underwent Medtronic-PCM placement in 1/16 by Dr Caryl Comes.   Echo in 10/16 showed fall in EF to 30% with mean aortic valve  gradient 21 mmHg.  Repeat echo in 2/17 showed EF 20-25%, mean aortic valve gradient 24 mmHg with moderately decreased RV systolic function.  Cardiolite in 10/16 showed inferior scar with no ischemia.  He is RV pacing almost all the time.  Low dose DSE was done in 2/17 to assess for significant bioprosthetic valve stenosis => mean gradient with dobutamine was only 31 mmHg, so suspect no more than moderate stenosis.  He had St Jude CRT upgrade in 3/17.  He decided against ICD.  Repeat echo post-CRT in 8/17 showed EF 50%, diffuse hypokinesis, bioprosthetic aortic valve with mean gradient 27 mmHg.  Echo in 9/18 showed EF 50-55%, bioprosthetic aortic valve with mean gradient 25 mmHg.  Echo in 10/19 showed EF 50-55%, bioprosthetic aortic valve with mean gradient 25 mmHg.   In 9/20, he was admitted with COVID-19 infection.    Echo in 3/21 showed EF 50-55% with mild LVH, mild RV enlargement with normal function, PASP 25, bioprosthetic aortic valve with mean gradient 24 and AVA 1.38 cm^2.   Echo was done today, showing EF 45-50%, mild RV dilation with normal systolic function, bioprosthetic aortic valve with mean gradient 21 mmHg and no PVL, IVC normal.   Patient returns for followup of CHF.  Weight is down 8 lbs.  He has a groin yeast infection in the setting of Jardiance use. No dyspnea walking on flat ground.  No chest pain.  No lightheadedness.  He chronically elevates the head of his bed.  He has been taking  Ibuprofen for aches and pains.   ECG (personally reviewed): NSR, BiV pacing  Labs (2/15): K 4, creatinine 1.6, BNP 166 Labs (3/15): Hgb 11.6, pro-BNP 1037, creatinine 1.76, BUN 30, LDL 75, HDL 26 Labs (8/15): K 3.9, creatinine 1.55, HCT 40.6, LDL 71, HDL 27 Labs (1/16): K 4.3, Creatinine 1.6, HCT 42.9 Labs (2/17): K 4.0 Creatinine 1.64, TSH normal.  Labs (3/17): K 4.5, creatinine 1.49, HCT 42.9 Labs (4/17): K 4, creatinine 1.48, BNP 125 Labs (6/17): LDL 27, HDL 24, TGs 245 Labs (7/17): K 4.9,  creatinine 1.71 Labs (8/17): K 4.3, creatinine 1.7, LDL 64, HDl 25, TGs 198 Lab (06/16/2016): K 4.9 Creatinine 1.84  Labs (10/2016): Creatinine 1.5 K 3.9  Labs (6/18): K 4.4, creatinine 1.57, BNP 55 Labs (7/18): LDL 58, HDL 22, creatinine 1.64 Labs (11/19): K 3.9, creatinine 1.5  Labs (9/20): K 4, creatinine 1.62, hgb 15.5 Labs (2/21): LDL 52, HDL 24, TGs 202, hgb 14.2, K 3.8, creatinine 1.6 Labs (3/21): K 3.5, creatinine 1.8 Labs (6/21): K 4, creatinine 1.68 Labs (9/21): LDL 48, TGs 174 Labs (2/22): hgb 14.6, K 3.6, creatinine 1.49 Labs (5/22): K 4.2, creatinine 1.97, BNP 156  PMH: 1. PNA (H1N1 influenza + pneumococcus) in 2/54 complicated by respiratory failure/ARDS. Required tracheostomy, now weaned off. Had left empyema requiring chest tube.  Has developed post-infectious pulmonary fibrosis.  2. Prostate CA s/p radiation treatment. Has indwelling foley.  3. OSA: using CPAP 4. HTN  5. Hyperlipidemia  6. COPD: History of heavy smoking. PFTs (4/14) with mixed obstructive (COPD) and restrictive (post-ARDS) picture.  - CT chest 12/19 consistent with emphysema.  7. Type II diabetes  8. Anemia of chronic disease.  9. Ischemic cardiomyopathy:  - Echo (1/14) with severely dilated LV, EF 30-35%, diffuse hypokinesis worse in the anteroseptal wall, grade II diastolic dysfunction, mild MR, AS interpreted as "moderate to severe" with aortic valve mean gradient 23 mmHg.  - Echo (4/14) witih EF 35-40%, mid to apical anteroseptal akinesis, moderate to severe AS with mean gradient 33 mmHg, AVA 0.91 cm^2.  -Echo (5/14) post CABG showed EF 50%, anteroseptal hypokinesis, bioprosthetic aortic valve well-seated. - Echo (1/15) with EF 55-60%, mild LVH, mildly dilated RV with mildly decreased systolic function, D-shaped interventricular septum, bioprosthetic aortic valve with mean gardient 32 mmHg.   - Echo (3/15) with EF 55-60%, mild LVH, bioprosthetic aortic valve with mean gradient 21 mmHg (lower), no AI,  RV moderately dilated with mild to moderately decreased systolic function.   - Echo (10/16) with EF 30%, bioprosthetic aortic valve with mean gradient 21 mmHg.   - Echo (2/17) with EF 20-25%, mild LVH, aortic valve mean gradient 24 mmHg/peak 39 mmHg, RV moderately dilated with moderately decreased systolic function.  - St Jude CRT upgrade in 3/17 (did not want ICD).  - Echo (8/17) with EF 50%, diffuse hypokinesis, bioprosthetic aortic valve with mean gradient 27 mmHg, normal RV size with mildly decreased systolic function.   - AKI with lisinopril.  - Echo (9/18): EF 50-55%, bioprosthetic aortic valve with mean gradient 25 mmHg.  - Echo (10/19): EF 50-55%, bioprosthetic aortic valve with mean gradient 25 mmHg, normal RV size and systolic function.  - Echo (3/21): EF 50-55% with mild LVH, mild RV enlargement with normal function, PASP 25, bioprosthetic aortic valve with mean gradient 24 and AVA 1.38 cm^2.    - Echo (8/22): EF 45-50%, mild RV dilation with normal systolic function, bioprosthetic aortic valve with mean gradient 21 mmHg and no PVL, IVC normal.  10. Aortic stenosis: Moderate to severe by echo in 4/14. Bioprosthetic #21 [facility] Ease aortic valve replacement in 4/14.  Low dose DSE (2/17) showed that Mr Roarty likely has no more than moderate bioprosthetic valve stenosis with mean gradient up to 31 mmHg with dobutamine.  11. CAD: LHC (4/14) with 95% pLAD, 70-80% ostial ramus, 70% mLCx, 90% pRCA, EF 35%.  - CABG 4/14 with LIMA-LAD, seq SVG-ramus and OM1, SVG-PDA.  - Cardiolite (10/16) with EF 42%, fixed inferior defect, no ischemia.   12. Carotid stenosis: Carotid dopplers (4/97) with 02-63% LICA stenosis.  Carotids (7/85) with 88-50% LICA stenosis. Carotids (2/77) with 41-28% LICA stenosis.  - Carotid dopplers (7/86): 76-72% LICA stenosis.  - Carotid dopplers (0/94): 70-96% LICA stenosis.  13. AAA: 5/14 CT showed > 6 cm AAA, also right iliac aneurysm. Not stent graft candidate.  7/14 CT  showed 7 cm AAA.  Surgical repair 1/15.  14. CKD: AKI in 7/14 from contrast nephropathy.  AKI in 1/15 after AAA repair.  15. Contrast allergy.  16. ABIs 9/14 were normal.  17. Gout 18. 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  HR drops with exercise (likely exercise-induced heart block).  Medtronic dual chamber PPM placed in 2016.  Recent pacemaker interrogation showed complete heart block.  He is pacemaker-dependent, upgraded to CRT in 3/17.  19. COVID-19 PNA in 9/20  SH: Quit smoking in 1/14, married, no ETOH, lives in [place].   FH: No premature CAD  ROS: All systems reviewed and negative except as per HPI.    Current Outpatient Medications  Medication Sig Dispense Refill   acetaminophen (TYLENOL) 500 MG tablet Take 1 tablet (500 mg total) by mouth every 6 (six) hours as needed. 30 tablet 0   albuterol (VENTOLIN HFA) 108 (90 Base) MCG/ACT inhaler Inhale 2 puffs into the lungs every 6 (six) hours as needed for wheezing or shortness of breath. 8 g 2   allopurinol (ZYLOPRIM) 300 MG tablet Take 300 mg by mouth daily.     amoxicillin (AMOXIL) 500 MG capsule TAKE 4 CAPS 1 HOUR PRIOR TO TREATMENT     aspirin EC 81 MG tablet Take 81 mg by mouth daily.     atorvastatin (LIPITOR) 80 MG tablet TAKE 1 TABLET (80 MG TOTAL) BY MOUTH DAILY BEFORE BREAKFAST. 90 tablet 3   bisoprolol (ZEBETA) 5 MG tablet TAKE 1/2 TABLETS (2.5 MG TOTAL) BY MOUTH DAILY. 45 tablet 3   Blood Glucose Calibration (ONETOUCH VERIO) SOLN Use to calibrate one touch verio level 3 1 each 3   calcitRIOL (ROCALTROL) 0.25 MCG capsule Take 0.25 mcg by mouth daily.      dextromethorphan-guaiFENesin (MUCINEX DM) 30-600 MG 12hr tablet Take 1 tablet by mouth 2 (two) times daily.     empagliflozin (JARDIANCE) 10 MG TABS tablet Take 1 tablet (10 mg total) by mouth daily before breakfast. 30 tablet 6   fexofenadine (ALLEGRA) 180 MG tablet Take 90 mg by mouth daily.      fluconazole (DIFLUCAN) 150 MG tablet Take 1  tablet (150 mg total) by mouth daily for 3 days. 3 tablet 0   Fluticasone-Umeclidin-Vilant (TRELEGY ELLIPTA) 100-62.5-25 MCG/INH AEPB Inhale 1 puff into the lungs daily. 1 each 0   glucose blood (ONETOUCH VERIO) test strip Use to check blood sugar once a day. 100 strip 12   insulin degludec (TRESIBA FLEXTOUCH) 200 UNIT/ML FlexTouch Pen Inject 28 Units into the skin daily.     Insulin Pen Needle 31G  X 4 MM MISC 1 each by Does not apply route daily. Use to inject insulin daily 100 each 2   Omega-3 Fatty Acids (FISH OIL MAXIMUM STRENGTH PO) Take 1 capsule by mouth in the morning and at bedtime.      OZEMPIC, 1 MG/DOSE, 4 MG/3ML SOPN INJECT 1 MG INTO THE SKIN ONCE A WEEK 9 mL 1   pantoprazole (PROTONIX) 40 MG tablet Take 40 mg by mouth at bedtime.      Potassium Chloride (KLOR-CON 10 PO) Take 2 tablets by mouth daily in the afternoon.     SITagliptin Phosphate (JANUVIA PO) Take 10 mg by mouth.     spironolactone (ALDACTONE) 25 MG tablet Take 0.5 tablets (12.5 mg total) by mouth daily. 15 tablet 6   torsemide (DEMADEX) 20 MG tablet Take 2 tablets (40 mg total) by mouth in the morning AND 1 tablet (20 mg total) every evening. 360 tablet 3   No current facility-administered medications for this encounter.    Vitals:   02/23/21 1127  BP: 112/70  Pulse: 78  SpO2: 94%  Weight: 98.2 kg (216 lb 6.4 oz)    Wt Readings from Last 3 Encounters:  02/23/21 98.2 kg (216 lb 6.4 oz)  02/22/21 98 kg (216 lb)  01/13/21 99.7 kg (219 lb 12.8 oz)   General: NAD Neck: No JVD, no thyromegaly or thyroid nodule.  Lungs: Clear to auscultation bilaterally with normal respiratory effort. CV: Nondisplaced PMI.  Heart regular S1/S2, no S3/S4, 2/6 SEM RUSB with clear S2.  1+ ankle edema.  No carotid bruit.  Normal pedal pulses.  Abdomen: Soft, nontender, no hepatosplenomegaly, no distention.  Skin: Intact without lesions or rashes.  Neurologic: Alert and oriented x 3.  Psych: Normal affect. Extremities: No clubbing  or cyanosis.  HEENT: Normal.   ASSESSMENT & PLAN: 1. CAD: Status post CABG for 3VD.  Cardiolite in 10/16 with inferior scar, no ischemia.  No chest pain.  - Continue ASA, statin.   2. Chronic systolic CHF => chronic HF with mid range EF:  Echo in 2/17 showed EF down to 20-25%.  No ischemia on Cardiolite.  Constant RV pacing may have led to decline in EF.  We considered bioprosthetic valve stenosis as cause of fall in EF, but low gradient DSE did not suggest severe stenosis.  Echo 8/17 after CRT upgrade shows improved EF to 50%.  Echo in 3/21 showed EF 50-55%.  Echo today with EF 45-50%.  Weight down.  NYHA class II symptoms, not volume overloaded on exam.   - Continue bisoprolol 2.5 mg daily.   - AKI with lisinopril, will leave off.  - Continue Jardiance 10 mg daily.  Likely has related groin fungal infection, will give course of fluconazole.  - Add spironolactone 12.5 mg daily with BMET today and in 10 days.  - Continue torsemide 40 qam/20 qpm.  3. 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 Suspect that intrinsic lung disease (post-infectious pulmonary fibrosis) is a contributor to his dyspnea and hypoxemia. He is now off oxygen (just using CPAP at night).  He had emphysema noted on 12/19 CT.  - Follows with pulmonary.   4. Carotid stenosis: Due for carotid US, will schedule. 5. Hyperlipidemia: Check lipids today.   6. AAA: s/p surgical repair. 7. Status post bioprosthetic AVR: Gradient across the aortic valve has been elevated. There was some concern for possible low gradient severe bioprosthetic aortic stenosis, but low dose DSE in 2017 suggested that valvular stenosis was no  more than moderate.  Mean gradient 24 mmHg across bioprosthetic valve on 3/21 echo. Mean gradient was 21 mmHg across bioprosthetic aortic valve on today's echo.  The valve appears stable, not to the point where he would benefit from TAVR.     - He will need antibiotic prophylaxis with dental work.   - If bioprosthetic aortic valve stenosis worsens, he would be a candidate for TAVR.  8. CKD: Stage III.  - Check BMET today.  - Stop using Ibuprofen, he will use Tylenol instead.  9. 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  S/p Medtronic PCM 1/16.   Patient is pacemaker-dependent with complete heart block.  He had upgrade to CRT in 3/17.   10. OSA: He is on CPAP.  Continue CPAP nightly.  11. Gout: on allopurinol.  Followup in 3 months   Signed, Loralie Champagne, MD  02/23/2021  [facility] [address] [facility] and [place] [address] [phone] (office) [phone] (fax)

## 2021-02-23 NOTE — Telephone Encounter (Signed)
Called and confirmed pt having some low readings and has reduced his [place] to 28 units daily. Please advise.

## 2021-02-24 NOTE — Telephone Encounter (Signed)
Spoke with pt and advised that per Medic[employer] guidelines he will no longer qualify for lung cancer screening. Pt will discuss with PCP if any further f/u is needed.

## 2021-02-24 NOTE — Progress Notes (Signed)
Remote pacemaker transmission.   

## 2021-02-24 NOTE — Telephone Encounter (Signed)
Left message for pt to call back  °

## 2021-02-24 NOTE — Telephone Encounter (Signed)
He has Medicare A & B and [employer] supplement

## 2021-02-27 ENCOUNTER — Other Ambulatory Visit: Payer: Self-pay | Admitting: Internal Medicine

## 2021-02-28 ENCOUNTER — Telehealth (HOSPITAL_COMMUNITY): Payer: Self-pay | Admitting: *Deleted

## 2021-02-28 ENCOUNTER — Other Ambulatory Visit: Payer: Self-pay | Admitting: Cardiology

## 2021-02-28 NOTE — Telephone Encounter (Signed)
Lab order faxed to [facility] [place] at [facility] in [place]  Fax [phone]

## 2021-03-01 LAB — BASIC METABOLIC PANEL
BUN/Creatinine Ratio: 14 (ref 10–24)
BUN: 32 mg/dL — ABNORMAL HIGH (ref 8–27)
CO2: 25 mmol/L (ref 20–29)
Calcium: 9.8 mg/dL (ref 8.6–10.2)
Chloride: 104 mmol/L (ref 96–106)
Creatinine, Ser: 2.21 mg/dL — ABNORMAL HIGH (ref 0.76–1.27)
Glucose: 92 mg/dL (ref 65–99)
Potassium: 4.1 mmol/L (ref 3.5–5.2)
Sodium: 143 mmol/L (ref 134–144)
eGFR: 30 mL/min/{1.73_m2} — ABNORMAL LOW (ref >59)

## 2021-03-09 ENCOUNTER — Telehealth: Payer: Self-pay | Admitting: Pulmonary Disease

## 2021-03-09 MED ORDER — TRELEGY ELLIPTA 100-62.5-25 MCG/INH IN AEPB: 1.0000 | INHALATION_SPRAY | Freq: Every day | RESPIRATORY_TRACT | 6 refills | Status: AC

## 2021-03-09 NOTE — Telephone Encounter (Signed)
Rx for the trelegy has been sent to the pharmacy per pts request.  Nothing further is needed.

## 2021-03-21 ENCOUNTER — Encounter (HOSPITAL_COMMUNITY): Payer: Self-pay

## 2021-03-22 ENCOUNTER — Other Ambulatory Visit: Payer: Self-pay

## 2021-03-22 ENCOUNTER — Ambulatory Visit (INDEPENDENT_AMBULATORY_CARE_PROVIDER_SITE_OTHER): Payer: Medicare Other

## 2021-03-22 DIAGNOSIS — I6523 Occlusion and stenosis of bilateral carotid arteries: Secondary | ICD-10-CM

## 2021-03-24 ENCOUNTER — Other Ambulatory Visit (HOSPITAL_COMMUNITY): Payer: Medicare Other

## 2021-03-24 ENCOUNTER — Encounter (HOSPITAL_COMMUNITY): Payer: Self-pay

## 2021-03-29 ENCOUNTER — Inpatient Hospital Stay (HOSPITAL_BASED_OUTPATIENT_CLINIC_OR_DEPARTMENT_OTHER)
Admission: EM | Admit: 2021-03-29 | Discharge: 2021-04-03 | DRG: 659 | Disposition: A | Discharge status: Home Health | Payer: Medicare Other | Source: Ambulatory Visit | Attending: Internal Medicine | Admitting: Internal Medicine

## 2021-03-29 ENCOUNTER — Other Ambulatory Visit: Payer: Self-pay

## 2021-03-29 ENCOUNTER — Encounter (HOSPITAL_BASED_OUTPATIENT_CLINIC_OR_DEPARTMENT_OTHER): Payer: Self-pay | Admitting: *Deleted

## 2021-03-29 ENCOUNTER — Emergency Department (HOSPITAL_BASED_OUTPATIENT_CLINIC_OR_DEPARTMENT_OTHER): Payer: Medicare Other

## 2021-03-29 DIAGNOSIS — Z6832 Body mass index (BMI) 32.0-32.9, adult: Secondary | ICD-10-CM

## 2021-03-29 DIAGNOSIS — Z794 Long term (current) use of insulin: Secondary | ICD-10-CM

## 2021-03-29 DIAGNOSIS — Z7951 Long term (current) use of inhaled steroids: Secondary | ICD-10-CM

## 2021-03-29 DIAGNOSIS — N183 Chronic kidney disease, stage 3 unspecified: Secondary | ICD-10-CM

## 2021-03-29 DIAGNOSIS — N390 Urinary tract infection, site not specified: Secondary | ICD-10-CM

## 2021-03-29 DIAGNOSIS — M109 Gout, unspecified: Secondary | ICD-10-CM | POA: Diagnosis present

## 2021-03-29 DIAGNOSIS — R109 Unspecified abdominal pain: Secondary | ICD-10-CM

## 2021-03-29 DIAGNOSIS — Z8249 Family history of ischemic heart disease and other diseases of the circulatory system: Secondary | ICD-10-CM

## 2021-03-29 DIAGNOSIS — Z91041 Radiographic dye allergy status: Secondary | ICD-10-CM

## 2021-03-29 DIAGNOSIS — E785 Hyperlipidemia, unspecified: Secondary | ICD-10-CM | POA: Diagnosis present

## 2021-03-29 DIAGNOSIS — E1122 Type 2 diabetes mellitus with diabetic chronic kidney disease: Secondary | ICD-10-CM

## 2021-03-29 DIAGNOSIS — Z8616 Personal history of COVID-19: Secondary | ICD-10-CM

## 2021-03-29 DIAGNOSIS — M899 Disorder of bone, unspecified: Secondary | ICD-10-CM | POA: Diagnosis present

## 2021-03-29 DIAGNOSIS — I714 Abdominal aortic aneurysm, without rupture, unspecified: Secondary | ICD-10-CM | POA: Diagnosis present

## 2021-03-29 DIAGNOSIS — Z8679 Personal history of other diseases of the circulatory system: Secondary | ICD-10-CM

## 2021-03-29 DIAGNOSIS — Z951 Presence of aortocoronary bypass graft: Secondary | ICD-10-CM

## 2021-03-29 DIAGNOSIS — Z888 Allergy status to other drugs, medicaments and biological substances status: Secondary | ICD-10-CM

## 2021-03-29 DIAGNOSIS — I130 Hypertensive heart and chronic kidney disease with heart failure and stage 1 through stage 4 chronic kidney disease, or unspecified chronic kidney disease: Secondary | ICD-10-CM | POA: Diagnosis present

## 2021-03-29 DIAGNOSIS — N179 Acute kidney failure, unspecified: Principal | ICD-10-CM | POA: Diagnosis present

## 2021-03-29 DIAGNOSIS — K219 Gastro-esophageal reflux disease without esophagitis: Secondary | ICD-10-CM | POA: Diagnosis present

## 2021-03-29 DIAGNOSIS — C7951 Secondary malignant neoplasm of bone: Secondary | ICD-10-CM

## 2021-03-29 DIAGNOSIS — G928 Other toxic encephalopathy: Secondary | ICD-10-CM | POA: Diagnosis not present

## 2021-03-29 DIAGNOSIS — Z7984 Long term (current) use of oral hypoglycemic drugs: Secondary | ICD-10-CM

## 2021-03-29 DIAGNOSIS — R590 Localized enlarged lymph nodes: Secondary | ICD-10-CM | POA: Diagnosis present

## 2021-03-29 DIAGNOSIS — N200 Calculus of kidney: Secondary | ICD-10-CM | POA: Diagnosis present

## 2021-03-29 DIAGNOSIS — E871 Hypo-osmolality and hyponatremia: Secondary | ICD-10-CM | POA: Diagnosis present

## 2021-03-29 DIAGNOSIS — T4145XA Adverse effect of unspecified anesthetic, initial encounter: Secondary | ICD-10-CM | POA: Diagnosis not present

## 2021-03-29 DIAGNOSIS — R52 Pain, unspecified: Secondary | ICD-10-CM

## 2021-03-29 DIAGNOSIS — Z953 Presence of xenogenic heart valve: Secondary | ICD-10-CM

## 2021-03-29 DIAGNOSIS — Z79899 Other long term (current) drug therapy: Secondary | ICD-10-CM

## 2021-03-29 DIAGNOSIS — Z950 Presence of cardiac pacemaker: Secondary | ICD-10-CM

## 2021-03-29 DIAGNOSIS — J449 Chronic obstructive pulmonary disease, unspecified: Secondary | ICD-10-CM | POA: Diagnosis present

## 2021-03-29 DIAGNOSIS — D49519 Neoplasm of unspecified behavior of unspecified kidney: Secondary | ICD-10-CM

## 2021-03-29 DIAGNOSIS — Z7985 Long-term (current) use of injectable non-insulin antidiabetic drugs: Secondary | ICD-10-CM

## 2021-03-29 DIAGNOSIS — M79672 Pain in left foot: Secondary | ICD-10-CM | POA: Diagnosis present

## 2021-03-29 DIAGNOSIS — I5042 Chronic combined systolic (congestive) and diastolic (congestive) heart failure: Secondary | ICD-10-CM | POA: Diagnosis present

## 2021-03-29 DIAGNOSIS — N2889 Other specified disorders of kidney and ureter: Secondary | ICD-10-CM | POA: Diagnosis present

## 2021-03-29 DIAGNOSIS — Z809 Family history of malignant neoplasm, unspecified: Secondary | ICD-10-CM

## 2021-03-29 DIAGNOSIS — Z881 Allergy status to other antibiotic agents status: Secondary | ICD-10-CM

## 2021-03-29 DIAGNOSIS — E861 Hypovolemia: Secondary | ICD-10-CM | POA: Diagnosis present

## 2021-03-29 DIAGNOSIS — E669 Obesity, unspecified: Secondary | ICD-10-CM | POA: Diagnosis present

## 2021-03-29 DIAGNOSIS — G4733 Obstructive sleep apnea (adult) (pediatric): Secondary | ICD-10-CM | POA: Diagnosis present

## 2021-03-29 DIAGNOSIS — N1831 Chronic kidney disease, stage 3a: Secondary | ICD-10-CM | POA: Diagnosis present

## 2021-03-29 DIAGNOSIS — Z87891 Personal history of nicotine dependence: Secondary | ICD-10-CM

## 2021-03-29 LAB — CBC
HCT: 44.5 % (ref 39.0–52.0)
Hemoglobin: 13.6 g/dL (ref 13.0–17.0)
MCH: 27.3 pg (ref 26.0–34.0)
MCHC: 30.6 g/dL (ref 30.0–36.0)
MCV: 89.4 fL (ref 80.0–100.0)
Platelets: 177 10*3/uL (ref 150–400)
RBC: 4.98 MIL/uL (ref 4.22–5.81)
RDW: 19.1 % — ABNORMAL HIGH (ref 11.5–15.5)
WBC: 5.5 10*3/uL (ref 4.0–10.5)
nRBC: 0 % (ref 0.0–0.2)

## 2021-03-29 LAB — URINALYSIS, ROUTINE W REFLEX MICROSCOPIC
Bilirubin Urine: NEGATIVE
Glucose, UA: 250 mg/dL — AB
Ketones, ur: NEGATIVE mg/dL
Nitrite: NEGATIVE
Protein, ur: 30 mg/dL — AB
Specific Gravity, Urine: 1.008 (ref 1.005–1.030)
WBC, UA: >50 WBC/hpf — ABNORMAL HIGH (ref 0–5)
pH: 5.5 (ref 5.0–8.0)

## 2021-03-29 LAB — BASIC METABOLIC PANEL
Anion gap: 11 (ref 5–15)
BUN: 84 mg/dL — ABNORMAL HIGH (ref 8–23)
CO2: 25 mmol/L (ref 22–32)
Calcium: 10.3 mg/dL (ref 8.9–10.3)
Chloride: 97 mmol/L — ABNORMAL LOW (ref 98–111)
Creatinine, Ser: 3.45 mg/dL — ABNORMAL HIGH (ref 0.61–1.24)
GFR, Estimated: 17 mL/min — ABNORMAL LOW (ref >60)
Glucose, Bld: 162 mg/dL — ABNORMAL HIGH (ref 70–99)
Potassium: 4.5 mmol/L (ref 3.5–5.1)
Sodium: 133 mmol/L — ABNORMAL LOW (ref 135–145)

## 2021-03-29 NOTE — ED Provider Notes (Signed)
[facility] EMERGENCY DEPT Provider Note   CSN: [id number] Arrival date & time: 03/29/21  1731     History Chief Complaint  Patient presents with   Flank Pain    [name] is a 80 y.o. male.  80 year old male with history of CKD, CHF, AAA presents to the ER secondary to left-sided flank pain.  Pain is been intermittent over the past few weeks.  Has some mild dysuria and urgency.  Hx of kidney stone multiple years ago. No fevers, chills, nausea, emesis, no change to bowel function. No recent diet or medication changes. Pain to left flank is sharp, stabbing. Worse with direct palpation and improved with rest. No medications prior to arrival. Pain has been gradual in onset. Intermittent. No change to PO.   Reports he was evaluated by nephrology in the past for elevated Cr but has not followed up recently.   The history is provided by the patient and the spouse. No language interpreter was used.  Flank Pain This is a new problem. The current episode started more than 1 week ago. The problem has been gradually worsening. Pertinent negatives include no chest pain, no abdominal pain, no headaches and no shortness of breath. The symptoms are aggravated by twisting. The symptoms are relieved by rest and lying down.      Past Medical History:  Diagnosis Date   AAA (abdominal aortic aneurysm) ([place])    5/14 CT showed > 6 cm AAA, also right iliac aneurysm. Not stent graft candidate.    Anemia of chronic disease    Anxiety    pt. admits that he has anxiety at times    Aortic stenosis    Moderate to severe by echo in 4/14. Bioprosthetic #21 [facility] Ease aortic valve replacement in 4/14.    Arthritis    CAD (coronary artery disease)    a. 4/14 CABG: LIMA-LAD, seq SVG-ramus & OM1, SVG-PDA   Carotid arterial disease (HCC)    Carotid dopplers (9/76) with 73-41% LICA stenosis.    CHF (congestive heart failure) ([place]) 07/2012; 10/17/2012   Chronic kidney disease     increased creatinine recently - being followed, near kidney failure fr. contrast dye    Chronic systolic heart failure ([place])    a. 1/14 ECHO: sev dil LV, EF30-35%, diff HK, mild MR, AS severe, AV grad 35 b. 8/14 ECHO: EF 55-60%, mild biopros AV sten mn grad. 25, RV mild dil, RA mild dil   Complication of anesthesia    during last surgery had to be given special medicine b/c "something dropped" during surgery    COPD (chronic obstructive pulmonary disease) ([place])    a.  History of heavy smoking. PFTs (4/14) with mixed obstructive (COPD) and restrictive (post-ARDS) picture.    Diabetes mellitus without complication ([place])    Esophageal reflux    History of blood transfusion    "lots since January" (10/17/2012)   Hyperlipidemia    Hypertension    Ischemic cardiomyopathy    Ischemic cardiomyopathy    a. 4/14 LHC: pLAD 95, ost ramus 70-80, mLCx 90, EF 30%   OSA (obstructive sleep apnea)    on cpap- every sleep time.    Pneumonia 07/2012   PNA (H1N1 influenza + pneumococcus) in 9/37 complicated by respiratory failure/ARDS. Required tracheostomy, now weaned off. Had left empyema requiring chest tube.    Pneumonia due to COVID-19 virus 03/22/2019   Prediabetes    Prostate cancer [facility])     s/p radiation treatment. - (  PT. DENIES)Has indwelling foley.    Shortness of breath    still goes deer hunting by himself    Patient Active Problem List   Diagnosis Date Noted   Sacral decubitus ulcer 03/22/2019   Congestive heart failure, NYHA class III ([place]) 09/22/2015   Symptomatic infraHisian 2 degree heart block 07/15/2014   Complete heart block ([place]) 07/15/2014   CKD (chronic kidney disease) stage 3, GFR 30-59 ml/min (HCC) 08/12/2013   AAA (abdominal aortic aneurysm) ([place]) 07/10/2013   OSA (obstructive sleep apnea) 06/17/2013   Ischemic cardiomyopathy    Chronic diastolic heart failure ([place]) 01/09/2013   Peripheral vascular disease, unspecified ([place]) 12/31/2012   Anxiety 12/26/2012   History of  aortic valve replacement with bioprosthetic valve 12/26/2012   Poorly controlled type 2 diabetes mellitus with circulatory disorder ([place]) 12/26/2012   CAD (coronary artery disease) 12/25/2012   Hx of CABG 12/25/2012   Orthostatic hypotension 12/25/2012   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[place]) 10/23/2012   COPD (chronic obstructive pulmonary disease) ([place]) 10/23/2012   Aortic stenosis 10/20/2012    Past Surgical History:  Procedure Laterality Date   ABDOMINAL AORTIC ANEURYSM REPAIR N/A 07/10/2013   Procedure: Resection and Graftiong of perirenal AAA; Insertion 14 x 8 Hemashield Graft Aorta to Left Common Iliac and to Right Common Femoral Artery With Ligastion of Right External and Interanl Iliac Artery;  Surgeon: Mal Misty, MD;  Location: [facility];  Service: Vascular;  Laterality: N/A;   ANAL FISSURE REPAIR  2008   AORTIC VALVE REPLACEMENT N/A 10/25/2012   Procedure: AORTIC VALVE REPLACEMENT (AVR);  Surgeon: Melrose Nakayama, MD;  Location: [facility];  Service: Open Heart Surgery;  Laterality: N/A;   CARDIAC VALVE REPLACEMENT     CORONARY ARTERY BYPASS GRAFT N/A 10/25/2012   Procedure: CORONARY ARTERY BYPASS GRAFTING (CABG);  Surgeon: Melrose Nakayama, MD;  Location: [facility];  Service: Open Heart Surgery;  Laterality: N/A;  times 4 using left internal mammary artery and endoscopically harvested bilateral saphenous vein    EP IMPLANTABLE DEVICE N/A 09/22/2015   Procedure: BiV Upgrade;  Surgeon: Deboraha Sprang, MD;  Location: [facility] CV LAB;  Service: Cardiovascular;  Laterality: N/A;   INTRAOPERATIVE TRANSESOPHAGEAL ECHOCARDIOGRAM N/A 10/25/2012   Procedure: INTRAOPERATIVE TRANSESOPHAGEAL ECHOCARDIOGRAM;  Surgeon: Melrose Nakayama, MD;  Location: [facility];  Service: Open Heart Surgery;  Laterality: N/A;   LEFT AND RIGHT HEART CATHETERIZATION WITH CORONARY ANGIOGRAM N/A 10/22/2012   Procedure: LEFT AND RIGHT HEART CATHETERIZATION WITH CORONARY ANGIOGRAM;  Surgeon: Larey Dresser, MD;   Location: [facility] CATH LAB;  Service: Cardiovascular;  Laterality: N/A;   NASAL FRACTURE SURGERY  1970's   PACEMAKER REVISION  09/22/2015   pacemaker upgrade    PERMANENT PACEMAKER INSERTION N/A 07/15/2014   Procedure: PERMANENT PACEMAKER INSERTION;  Surgeon: Deboraha Sprang, MD;  Location: [facility] CATH LAB;  Service: Cardiovascular;  Laterality: N/A;   TRACHEOSTOMY  07/2012   TRACHEOSTOMY CLOSURE  08/2012   TRANSURETHRAL MICROWAVE THERAPY  10/15/2012       Family History  Problem Relation Age of Onset   Cancer Mother    Heart disease Father    Congestive Heart Failure Other    Heart disease Other     Social History   Tobacco Use   Smoking status: Former    Packs/day: 2.00    Years: 58.00    Pack years: 116.00    Types: Cigarettes    Quit date: 07/20/2012    Years since quitting: 8.6  Smokeless tobacco: Never   Tobacco comments:    Remains smoke free  Vaping Use   Vaping Use: Never used  Substance Use Topics   Alcohol use: No    Alcohol/week: 0.0 standard drinks    Comment: 10/17/2012 "years since I've had a drink; never had problem with it"   Drug use: No    Home Medications Prior to Admission medications   Medication Sig Start Date End Date Taking? Authorizing Provider  albuterol (VENTOLIN HFA) 108 (90 Base) MCG/ACT inhaler Inhale 2 puffs into the lungs every 6 (six) hours as needed for wheezing or shortness of breath. 02/22/21  Yes Rigoberto Noel, MD  allopurinol (ZYLOPRIM) 300 MG tablet Take 300 mg by mouth daily.   Yes [provider]  aspirin EC 81 MG tablet Take 81 mg by mouth daily.   Yes [provider]  bisoprolol (ZEBETA) 5 MG tablet TAKE 1/2 TABLETS (2.5 MG TOTAL) BY MOUTH DAILY. 08/04/20  Yes Larey Dresser, MD  calcitRIOL (ROCALTROL) 0.25 MCG capsule Take 0.25 mcg by mouth daily.    Yes [provider]  dextromethorphan-guaiFENesin (MUCINEX DM) 30-600 MG 12hr tablet Take 1 tablet by mouth 2 (two) times daily.   Yes [provider]   empagliflozin (JARDIANCE) 10 MG TABS tablet Take 1 tablet (10 mg total) by mouth daily before breakfast. 11/19/20  Yes Larey Dresser, MD  fexofenadine (ALLEGRA) 180 MG tablet Take 90 mg by mouth daily.    Yes [provider]  Fluticasone-Umeclidin-Vilant (TRELEGY ELLIPTA) 100-62.5-25 MCG/INH AEPB Inhale 1 puff into the lungs daily. 03/09/21  Yes Rigoberto Noel, MD  insulin degludec (TRESIBA FLEXTOUCH) 200 UNIT/ML FlexTouch Pen Inject 28 Units into the skin daily. Patient taking differently: Inject 16 Units into the skin daily. 01/13/21  Yes Philemon Kingdom, MD  Omega-3 Fatty Acids (FISH OIL MAXIMUM STRENGTH PO) Take 1 capsule by mouth in the morning and at bedtime.    Yes [provider]  pantoprazole (PROTONIX) 40 MG tablet Take 40 mg by mouth at bedtime.  12/17/12  Yes [provider]  Potassium Chloride (KLOR-CON 10 PO) Take 2 tablets by mouth daily in the afternoon.   Yes [provider]  SITagliptin Phosphate (JANUVIA PO) Take 10 mg by mouth.   Yes [provider]  spironolactone (ALDACTONE) 25 MG tablet Take 0.5 tablets (12.5 mg total) by mouth daily. 02/23/21  Yes Larey Dresser, MD  torsemide (DEMADEX) 20 MG tablet Take 2 tablets (40 mg total) by mouth in the morning AND 1 tablet (20 mg total) every evening. 11/26/20  Yes Larey Dresser, MD  acetaminophen (TYLENOL) 500 MG tablet Take 1 tablet (500 mg total) by mouth every 6 (six) hours as needed. 02/23/21   Larey Dresser, MD  amoxicillin (AMOXIL) 500 MG capsule TAKE 4 CAPS 1 HOUR PRIOR TO TREATMENT 07/10/19   [provider]  atorvastatin (LIPITOR) 80 MG tablet TAKE 1 TABLET (80 MG TOTAL) BY MOUTH DAILY BEFORE BREAKFAST. 10/26/20   Larey Dresser, MD  Blood Glucose Calibration (ONETOUCH VERIO) SOLN Use to calibrate one touch verio level 3 05/31/20   Philemon Kingdom, MD  glucose blood (ONETOUCH VERIO) test strip Use to check blood sugar once a day. 05/26/20   Philemon Kingdom, MD   Insulin Pen Needle 31G X 4 MM MISC 1 each by Does not apply route daily. Use to inject insulin daily 06/17/18   Philemon Kingdom, MD  OZEMPIC, 1 MG/DOSE, 4 MG/3ML SOPN INJECT 1  MG INTO THE SKIN ONCE A WEEK 02/28/21   Philemon Kingdom, MD    Allergies    Iodinated diagnostic agents, Omnipaque [iohexol], Primaxin [imipenem], Cephalosporins, Lisinopril, and Clindamycin/lincomycin  Review of Systems   Review of Systems  Constitutional:  Negative for chills and fever.  HENT:  Negative for facial swelling and trouble swallowing.   Eyes:  Negative for photophobia and visual disturbance.  Respiratory:  Negative for cough and shortness of breath.   Cardiovascular:  Negative for chest pain and palpitations.  Gastrointestinal:  Negative for abdominal pain, nausea and vomiting.  Endocrine: Negative for polydipsia and polyuria.  Genitourinary:  Positive for dysuria, flank pain and urgency. Negative for difficulty urinating and hematuria.  Musculoskeletal:  Negative for gait problem and joint swelling.  Skin:  Negative for pallor and rash.  Neurological:  Negative for syncope and headaches.  Psychiatric/Behavioral:  Negative for agitation and confusion.    Physical Exam Updated Vital Signs BP 106/74   Pulse 88   Temp (!) 96 F (35.6 C) (Temporal)   Resp 20   Ht 5\' 5"  (1.651 m)   Wt 89.2 kg   SpO2 94%   BMI 32.72 kg/m   Physical Exam Vitals and nursing note reviewed.  Constitutional:      General: He is not in acute distress.    Appearance: Normal appearance. He is well-developed. He is obese.  HENT:     Head: Normocephalic and atraumatic.     Right Ear: External ear normal.     Left Ear: External ear normal.     Mouth/Throat:     Mouth: Mucous membranes are moist.  Eyes:     General: No scleral icterus. Cardiovascular:     Rate and Rhythm: Normal rate and regular rhythm.     Pulses: Normal pulses.     Heart sounds: Normal heart sounds.  Pulmonary:     Effort: Pulmonary  effort is normal. No respiratory distress.     Breath sounds: Normal breath sounds.  Abdominal:     General: Abdomen is flat.     Palpations: Abdomen is soft.     Tenderness: There is abdominal tenderness. There is guarding (voluntary).    Musculoskeletal:        General: Normal range of motion.     Cervical back: Normal range of motion.     Right lower leg: No edema.     Left lower leg: No edema.  Skin:    General: Skin is warm and dry.     Capillary Refill: Capillary refill takes less than 2 seconds.     Coloration: Skin is not jaundiced or pale.  Neurological:     Mental Status: He is alert and oriented to person, place, and time.  Psychiatric:        Mood and Affect: Mood normal.        Behavior: Behavior normal.    ED Results / Procedures / Treatments   Labs (all labs ordered are listed, but only abnormal results are displayed) Labs Reviewed  BASIC METABOLIC PANEL - Abnormal; Notable for the following components:      Result Value   Sodium 133 (*)    Chloride 97 (*)    Glucose, Bld 162 (*)    BUN 84 (*)    Creatinine, Ser 3.45 (*)    GFR, Estimated 17 (*)    All other components within normal limits  CBC - Abnormal; Notable for the following components:   RDW 19.1 (*)  All other components within normal limits  URINALYSIS, ROUTINE W REFLEX MICROSCOPIC - Abnormal; Notable for the following components:   APPearance CLOUDY (*)    Glucose, UA 250 (*)    Hgb urine dipstick SMALL (*)    Protein, ur 30 (*)    Leukocytes,Ua LARGE (*)    WBC, UA >50 (*)    Bacteria, UA RARE (*)    All other components within normal limits  URINE CULTURE  RESP PANEL BY RT-PCR (FLU A&B, COVID) ARPGX2  PHOSPHORUS  HEPATIC FUNCTION PANEL    EKG None  Radiology   Procedures Procedures   Medications Ordered in ED Medications  ciprofloxacin (CIPRO) IVPB 400 mg (has no administration in time range)    ED Course  I have reviewed the triage vital signs and the nursing  notes.  Pertinent labs & imaging results that were available during my care of the patient were reviewed by me and considered in my medical decision making (see chart for details).    MDM Rules/Calculators/A&P                         This patient complains of flank pain; this involves an extensive number of treatment options and is a complaint that carries with it a high risk of complications and morbidity. Vital signs reviewed and are stable. Serious etiologies considered.   I ordered, reviewed and interpreted labs, BMP with acutely worsening creatinine from baseline.  Creatinine today is 3.45.  Elevated BUN.  Baseline creatinine around 2.  Concern for AKI.  Urinalysis does demonstrate evidence for infection.  Urine culture sent. Not currently septic.  Given increased creatinine and UTI will obtain CT abdomen pelvis renal stone protocol.  Additional history obtained from family  Previous records obtained and reviewed   At this time patient is pending CT imaging of the abdomen and pelvis. This has been signed out to the incoming physician. Anticipate admission given AKI in setting of CKD and new infection.    Final Clinical Impression(s) / ED Diagnoses Final diagnoses:  Acute renal failure superimposed on chronic kidney disease, unspecified CKD stage, unspecified acute renal failure type ([place])  Urinary tract infection without hematuria, site unspecified  Abdominal pain, unspecified abdominal location    Rx / DC Orders ED Discharge Orders     None        Jeanell Sparrow, DO 03/30/21 0021

## 2021-03-29 NOTE — ED Triage Notes (Signed)
Seen by his orthopedist today and encouraged him to come here to be checked for his left intermittent flank pain for 2 months.

## 2021-03-30 ENCOUNTER — Encounter (HOSPITAL_COMMUNITY): Payer: Self-pay | Admitting: Internal Medicine

## 2021-03-30 DIAGNOSIS — N200 Calculus of kidney: Secondary | ICD-10-CM | POA: Diagnosis present

## 2021-03-30 DIAGNOSIS — G928 Other toxic encephalopathy: Secondary | ICD-10-CM | POA: Diagnosis not present

## 2021-03-30 DIAGNOSIS — C7951 Secondary malignant neoplasm of bone: Secondary | ICD-10-CM

## 2021-03-30 DIAGNOSIS — Z87891 Personal history of nicotine dependence: Secondary | ICD-10-CM | POA: Diagnosis not present

## 2021-03-30 DIAGNOSIS — J449 Chronic obstructive pulmonary disease, unspecified: Secondary | ICD-10-CM | POA: Diagnosis present

## 2021-03-30 DIAGNOSIS — E785 Hyperlipidemia, unspecified: Secondary | ICD-10-CM | POA: Diagnosis present

## 2021-03-30 DIAGNOSIS — Z953 Presence of xenogenic heart valve: Secondary | ICD-10-CM | POA: Diagnosis not present

## 2021-03-30 DIAGNOSIS — G4733 Obstructive sleep apnea (adult) (pediatric): Secondary | ICD-10-CM | POA: Diagnosis present

## 2021-03-30 DIAGNOSIS — N2889 Other specified disorders of kidney and ureter: Secondary | ICD-10-CM | POA: Diagnosis present

## 2021-03-30 DIAGNOSIS — Z809 Family history of malignant neoplasm, unspecified: Secondary | ICD-10-CM | POA: Diagnosis not present

## 2021-03-30 DIAGNOSIS — N1831 Chronic kidney disease, stage 3a: Secondary | ICD-10-CM

## 2021-03-30 DIAGNOSIS — E1122 Type 2 diabetes mellitus with diabetic chronic kidney disease: Secondary | ICD-10-CM | POA: Diagnosis present

## 2021-03-30 DIAGNOSIS — Z8679 Personal history of other diseases of the circulatory system: Secondary | ICD-10-CM | POA: Diagnosis not present

## 2021-03-30 DIAGNOSIS — D49519 Neoplasm of unspecified behavior of unspecified kidney: Secondary | ICD-10-CM | POA: Diagnosis not present

## 2021-03-30 DIAGNOSIS — M79672 Pain in left foot: Secondary | ICD-10-CM | POA: Diagnosis not present

## 2021-03-30 DIAGNOSIS — I130 Hypertensive heart and chronic kidney disease with heart failure and stage 1 through stage 4 chronic kidney disease, or unspecified chronic kidney disease: Secondary | ICD-10-CM | POA: Diagnosis present

## 2021-03-30 DIAGNOSIS — R109 Unspecified abdominal pain: Secondary | ICD-10-CM | POA: Diagnosis present

## 2021-03-30 DIAGNOSIS — J432 Centrilobular emphysema: Secondary | ICD-10-CM | POA: Diagnosis not present

## 2021-03-30 DIAGNOSIS — N183 Chronic kidney disease, stage 3 unspecified: Secondary | ICD-10-CM | POA: Diagnosis present

## 2021-03-30 DIAGNOSIS — Z881 Allergy status to other antibiotic agents status: Secondary | ICD-10-CM | POA: Diagnosis not present

## 2021-03-30 DIAGNOSIS — E861 Hypovolemia: Secondary | ICD-10-CM | POA: Diagnosis present

## 2021-03-30 DIAGNOSIS — N390 Urinary tract infection, site not specified: Secondary | ICD-10-CM

## 2021-03-30 DIAGNOSIS — I5042 Chronic combined systolic (congestive) and diastolic (congestive) heart failure: Secondary | ICD-10-CM | POA: Diagnosis present

## 2021-03-30 DIAGNOSIS — N179 Acute kidney failure, unspecified: Principal | ICD-10-CM

## 2021-03-30 DIAGNOSIS — Z6832 Body mass index (BMI) 32.0-32.9, adult: Secondary | ICD-10-CM | POA: Diagnosis not present

## 2021-03-30 DIAGNOSIS — I714 Abdominal aortic aneurysm, without rupture, unspecified: Secondary | ICD-10-CM | POA: Diagnosis present

## 2021-03-30 DIAGNOSIS — Z8616 Personal history of COVID-19: Secondary | ICD-10-CM | POA: Diagnosis not present

## 2021-03-30 DIAGNOSIS — E669 Obesity, unspecified: Secondary | ICD-10-CM | POA: Diagnosis present

## 2021-03-30 DIAGNOSIS — E871 Hypo-osmolality and hyponatremia: Secondary | ICD-10-CM | POA: Diagnosis present

## 2021-03-30 DIAGNOSIS — T4145XA Adverse effect of unspecified anesthetic, initial encounter: Secondary | ICD-10-CM | POA: Diagnosis not present

## 2021-03-30 LAB — GLUCOSE, CAPILLARY
Glucose-Capillary: 164 mg/dL — ABNORMAL HIGH (ref 70–99)
Glucose-Capillary: 123 mg/dL — ABNORMAL HIGH (ref 70–99)
Glucose-Capillary: 138 mg/dL — ABNORMAL HIGH (ref 70–99)

## 2021-03-30 LAB — HEPATIC FUNCTION PANEL
ALT: 16 U/L (ref 0–44)
ALT: 19 U/L (ref 0–44)
AST: 16 U/L (ref 15–41)
AST: 20 U/L (ref 15–41)
Albumin: 4.1 g/dL (ref 3.5–5.0)
Albumin: 3.5 g/dL (ref 3.5–5.0)
Alkaline Phosphatase: 131 U/L — ABNORMAL HIGH (ref 38–126)
Alkaline Phosphatase: 150 U/L — ABNORMAL HIGH (ref 38–126)
Bilirubin, Direct: 0.2 mg/dL (ref 0.0–0.2)
Bilirubin, Direct: 0.2 mg/dL (ref 0.0–0.2)
Indirect Bilirubin: 0.5 mg/dL (ref 0.3–0.9)
Indirect Bilirubin: 0.6 mg/dL (ref 0.3–0.9)
Total Bilirubin: 0.7 mg/dL (ref 0.3–1.2)
Total Bilirubin: 0.8 mg/dL (ref 0.3–1.2)
Total Protein: 7.7 g/dL (ref 6.5–8.1)
Total Protein: 7.3 g/dL (ref 6.5–8.1)

## 2021-03-30 LAB — RESP PANEL BY RT-PCR (FLU A&B, COVID) ARPGX2
Influenza A by PCR: NEGATIVE
Influenza B by PCR: NEGATIVE
SARS Coronavirus 2 by RT PCR: NEGATIVE

## 2021-03-30 LAB — CBC
HCT: 43.5 % (ref 39.0–52.0)
Hemoglobin: 13.3 g/dL (ref 13.0–17.0)
MCH: 27.4 pg (ref 26.0–34.0)
MCHC: 30.6 g/dL (ref 30.0–36.0)
MCV: 89.5 fL (ref 80.0–100.0)
Platelets: 153 10*3/uL (ref 150–400)
RBC: 4.86 MIL/uL (ref 4.22–5.81)
RDW: 19.1 % — ABNORMAL HIGH (ref 11.5–15.5)
WBC: 5.6 10*3/uL (ref 4.0–10.5)
nRBC: 0 % (ref 0.0–0.2)

## 2021-03-30 LAB — BASIC METABOLIC PANEL
Anion gap: 10 (ref 5–15)
BUN: 86 mg/dL — ABNORMAL HIGH (ref 8–23)
CO2: 23 mmol/L (ref 22–32)
Calcium: 10.1 mg/dL (ref 8.9–10.3)
Chloride: 101 mmol/L (ref 98–111)
Creatinine, Ser: 2.95 mg/dL — ABNORMAL HIGH (ref 0.61–1.24)
GFR, Estimated: 21 mL/min — ABNORMAL LOW (ref >60)
Glucose, Bld: 150 mg/dL — ABNORMAL HIGH (ref 70–99)
Potassium: 4.9 mmol/L (ref 3.5–5.1)
Sodium: 134 mmol/L — ABNORMAL LOW (ref 135–145)

## 2021-03-30 LAB — PHOSPHORUS: Phosphorus: 4.3 mg/dL (ref 2.5–4.6)

## 2021-03-30 LAB — PSA: Prostatic Specific Antigen: 0.19 ng/mL (ref 0.00–4.00)

## 2021-03-30 LAB — CBG MONITORING, ED: Glucose-Capillary: 162 mg/dL — ABNORMAL HIGH (ref 70–99)

## 2021-03-30 MED ORDER — ONDANSETRON HCL 4 MG PO TABS: 4.0000 mg | ORAL_TABLET | Freq: Four times a day (QID) | ORAL | Status: DC | PRN

## 2021-03-30 MED ORDER — HEPARIN SODIUM (PORCINE) 5000 UNIT/ML IJ SOLN
5000.0000 [IU] | Freq: Three times a day (TID) | INTRAMUSCULAR | Status: DC
  Administered 2021-03-31 – 2021-04-01 (×2): 5000 [IU] via SUBCUTANEOUS
  Filled 2021-03-30 (×2): qty 1

## 2021-03-30 MED ORDER — INSULIN GLARGINE-YFGN 100 UNIT/ML ~~LOC~~ SOLN
16.0000 [IU] | Freq: Every day | SUBCUTANEOUS | Status: DC
  Administered 2021-03-30 – 2021-04-03 (×4): 16 [IU] via SUBCUTANEOUS
  Filled 2021-03-30 (×5): qty 0.16

## 2021-03-30 MED ORDER — UMECLIDINIUM BROMIDE 62.5 MCG/INH IN AEPB
1.0000 | INHALATION_SPRAY | Freq: Every day | RESPIRATORY_TRACT | Status: DC
  Administered 2021-03-30 – 2021-04-03 (×4): 1 via RESPIRATORY_TRACT
  Filled 2021-03-30: qty 7

## 2021-03-30 MED ORDER — PANTOPRAZOLE SODIUM 40 MG PO TBEC
40.0000 mg | DELAYED_RELEASE_TABLET | Freq: Every day | ORAL | Status: DC
  Administered 2021-03-30 – 2021-04-02 (×4): 40 mg via ORAL
  Filled 2021-03-30 (×4): qty 1

## 2021-03-30 MED ORDER — FLUTICASONE FUROATE-VILANTEROL 100-25 MCG/INH IN AEPB
1.0000 | INHALATION_SPRAY | Freq: Every day | RESPIRATORY_TRACT | Status: DC
  Administered 2021-03-30 – 2021-04-03 (×4): 1 via RESPIRATORY_TRACT
  Filled 2021-03-30: qty 28

## 2021-03-30 MED ORDER — ENSURE ENLIVE PO LIQD
237.0000 mL | Freq: Two times a day (BID) | ORAL | Status: DC
  Administered 2021-03-30 – 2021-04-02 (×5): 237 mL via ORAL

## 2021-03-30 MED ORDER — ACETAMINOPHEN 325 MG PO TABS
650.0000 mg | ORAL_TABLET | Freq: Four times a day (QID) | ORAL | Status: DC | PRN
  Administered 2021-03-30 – 2021-03-31 (×3): 650 mg via ORAL
  Filled 2021-03-30 (×4): qty 2

## 2021-03-30 MED ORDER — EMPAGLIFLOZIN 10 MG PO TABS: 10.0000 mg | ORAL_TABLET | Freq: Every day | ORAL | Status: DC

## 2021-03-30 MED ORDER — ALBUTEROL SULFATE (2.5 MG/3ML) 0.083% IN NEBU: 2.5000 mg | INHALATION_SOLUTION | RESPIRATORY_TRACT | Status: DC | PRN

## 2021-03-30 MED ORDER — CIPROFLOXACIN IN D5W 200 MG/100ML IV SOLN
200.0000 mg | Freq: Two times a day (BID) | INTRAVENOUS | Status: DC
  Administered 2021-03-30: 200 mg via INTRAVENOUS
  Administered 2021-03-31: 400 mg via INTRAVENOUS
  Administered 2021-03-31 – 2021-04-01 (×3): 200 mg via INTRAVENOUS
  Filled 2021-03-30 (×6): qty 100

## 2021-03-30 MED ORDER — ASPIRIN EC 81 MG PO TBEC
81.0000 mg | DELAYED_RELEASE_TABLET | Freq: Every day | ORAL | Status: DC
  Administered 2021-03-30 – 2021-04-03 (×4): 81 mg via ORAL
  Filled 2021-03-30 (×4): qty 1

## 2021-03-30 MED ORDER — INSULIN DEGLUDEC 200 UNIT/ML ~~LOC~~ SOPN: PEN_INJECTOR | Freq: Every day | SUBCUTANEOUS | Status: DC

## 2021-03-30 MED ORDER — FLUTICASONE-UMECLIDIN-VILANT 100-62.5-25 MCG/INH IN AEPB: 1.0000 | INHALATION_SPRAY | Freq: Every day | RESPIRATORY_TRACT | Status: DC

## 2021-03-30 MED ORDER — BISOPROLOL FUMARATE 5 MG PO TABS
2.5000 mg | ORAL_TABLET | Freq: Every day | ORAL | Status: DC
  Administered 2021-03-30 – 2021-04-03 (×4): 2.5 mg via ORAL
  Filled 2021-03-30 (×4): qty 1

## 2021-03-30 MED ORDER — ONDANSETRON HCL 4 MG/2ML IJ SOLN
4.0000 mg | Freq: Four times a day (QID) | INTRAMUSCULAR | Status: DC | PRN
  Administered 2021-03-31: 4 mg via INTRAVENOUS

## 2021-03-30 MED ORDER — HYDROMORPHONE HCL 1 MG/ML IJ SOLN: 0.5000 mg | INTRAMUSCULAR | Status: DC | PRN

## 2021-03-30 MED ORDER — ACETAMINOPHEN 650 MG RE SUPP: 650.0000 mg | Freq: Four times a day (QID) | RECTAL | Status: DC | PRN

## 2021-03-30 MED ORDER — LACTATED RINGERS IV SOLN: INTRAVENOUS | Status: DC

## 2021-03-30 MED ORDER — INSULIN ASPART 100 UNIT/ML IJ SOLN
0.0000 [IU] | Freq: Every day | INTRAMUSCULAR | Status: DC
  Administered 2021-04-01: 2 [IU] via SUBCUTANEOUS

## 2021-03-30 MED ORDER — CIPROFLOXACIN IN D5W 400 MG/200ML IV SOLN
400.0000 mg | Freq: Once | INTRAVENOUS | Status: AC
  Administered 2021-03-30: 400 mg via INTRAVENOUS
  Filled 2021-03-30: qty 200

## 2021-03-30 MED ORDER — ATORVASTATIN CALCIUM 40 MG PO TABS
80.0000 mg | ORAL_TABLET | Freq: Every day | ORAL | Status: DC
  Administered 2021-03-30 – 2021-04-03 (×4): 80 mg via ORAL
  Filled 2021-03-30 (×4): qty 2

## 2021-03-30 MED ORDER — SENNOSIDES-DOCUSATE SODIUM 8.6-50 MG PO TABS: 1.0000 | ORAL_TABLET | Freq: Every evening | ORAL | Status: DC | PRN

## 2021-03-30 MED ORDER — ALBUTEROL SULFATE HFA 108 (90 BASE) MCG/ACT IN AERS: 2.0000 | INHALATION_SPRAY | RESPIRATORY_TRACT | Status: DC | PRN

## 2021-03-30 MED ORDER — HEPARIN SODIUM (PORCINE) 5000 UNIT/ML IJ SOLN
5000.0000 [IU] | Freq: Three times a day (TID) | INTRAMUSCULAR | Status: DC
  Administered 2021-03-30: 5000 [IU] via SUBCUTANEOUS
  Filled 2021-03-30: qty 1

## 2021-03-30 MED ORDER — INSULIN ASPART 100 UNIT/ML IJ SOLN
0.0000 [IU] | Freq: Three times a day (TID) | INTRAMUSCULAR | Status: DC
  Administered 2021-03-30: 2 [IU] via SUBCUTANEOUS
  Administered 2021-03-30 (×2): 1 [IU] via SUBCUTANEOUS
  Administered 2021-04-01 (×2): 5 [IU] via SUBCUTANEOUS
  Administered 2021-04-01: 1 [IU] via SUBCUTANEOUS
  Administered 2021-04-02: 2 [IU] via SUBCUTANEOUS
  Administered 2021-04-02: 1 [IU] via SUBCUTANEOUS

## 2021-03-30 NOTE — Progress Notes (Signed)
Pt is having a procedure done tomorrow on 03/31/21. Left ureteral stent is being placed and biopsy being done  Messaged Dr. [place] Pink to see if he wants [place] to continue to give Hep SQ.... VO given to hold until after procedure.   Will pass message onto night RN

## 2021-03-30 NOTE — Progress Notes (Signed)
[facility] PROGRESS NOTE   [name] VEH:[id number] DOB: October 25, 1940 DOA: 03/29/2021  PCP: [name], MD  Brief History/Interval Summary: 80 y.o. male with medical history significant for CKD 3A, DMT2, HFpEF, HTN, AAA, hx of BPH and questionable history of prostate cancer (patient denies a history of cancer), OSA who presented for evalaution of left flank pain.  He reports he has had a yeast infection in his groin for the last few weeks and he has been on Diflucan at home.  His last dose was yesterday.  He reports has been having some urinary urgency with mild dysuria.  He reports he had kidney stones in 1969 but none since then.  Noted to have UTI on UA.  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  Consultants: Urology  Procedures: None yet    Subjective/Interval History: Patient states that he is feeling slightly better this morning.  No nausea vomiting.  Has been passing urine without difficulty.  Denies any blood in the urine.   Assessment/Plan:  Acute kidney injury superimposed on chronic kidney disease stage IIIa/hyponatremia It appears that his baseline creatinine is around 2.0.  Presented with BUN of 84 and creatinine of 3.45.  There was thought to be an element of hypovolemia.  Patient also had hyponatremia.  He was started on IV fluids.  Currently on LR at 75 mill per hour.  His creatinine has improved to 2.95 this morning.  Continue to monitor urine output.  Avoid nephrotoxic agents.  Urinary tract infection UA noted to be abnormal.  Culture is pending.  Currently on ciprofloxacin.  He has allergies to cephalosporins.  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  Urology has been consulted.  They are planning a biopsy.  Sclerotic osseous lesions When asked about his history of prostate cancer patient absolutely denies any history of  prostate cancer.  His PSA is noted to be 0.19.  Previously was being cared for by urologist, [name], in [facility].  These lesions will likely need further work-up.  Will depend on the pathology of his kidney lesion.  But these issues can be pursued in the outpatient setting.  Nephrolithiasis Noted on CT scan but they are nonobstructing.  Currently asymptomatic.  Further management per urology.  History of COPD Stable.  Home medications being continued.  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  SSI.  Also on glargine.  HbA1c was 5.9 in July.  Obstructive sleep apnea CPAP  Essential hypertension Noted to be on bisoprolol which will be continued.  Hyperlipidemia Continue statin.  Obesity Estimated body mass index is 32.57 kg/m as calculated from the following:   Height as of this encounter: 5\' 5"  (1.651 m).   Weight as of this encounter: 88.8 kg.    DVT Prophylaxis: Subcutaneous heparin Code Status: Full code Family Communication: Discussed with patient.  No family at bedside Disposition Plan: Hopefully return home when improved.  Mobilize.  Status is: Inpatient  Remains inpatient appropriate because:Ongoing diagnostic testing needed not appropriate for outpatient work up, IV treatments appropriate due to intensity of illness or inability to take PO, and Inpatient level of care appropriate due to severity of illness  Dispo: The patient is from: Home              Anticipated d/c is to:  Home              Patient currently is not medically stable to d/c.   Difficult to place patient No     Medications: Scheduled:  aspirin EC  81 mg Oral Daily   atorvastatin  80 mg Oral QAC breakfast   bisoprolol  2.5 mg Oral Daily   feeding supplement  237 mL Oral BID BM   fluticasone furoate-vilanterol  1 puff Inhalation Daily   And   umeclidinium bromide  1 puff Inhalation Daily   heparin  5,000 Units  Subcutaneous Q8H   insulin aspart  0-5 Units Subcutaneous QHS   insulin aspart  0-9 Units Subcutaneous TID WC   insulin glargine-yfgn  16 Units Subcutaneous Daily   pantoprazole  40 mg Oral QHS   Continuous:  ciprofloxacin     lactated ringers 75 mL/hr at 03/30/21 0631   BHA:[id number] **OR** acetaminophen, albuterol, HYDROmorphone (DILAUDID) injection, ondansetron **OR** ondansetron (ZOFRAN) IV, senna-docusate  Antibiotics: Anti-infectives (From admission, onward)    Start     Dose/Rate Route Frequency Ordered Stop   03/30/21 2200  ciprofloxacin (CIPRO) IVPB 200 mg        200 mg 100 mL/hr over 60 Minutes Intravenous Every 12 hours 03/30/21 0524     03/30/21 0015  ciprofloxacin (CIPRO) IVPB 400 mg        400 mg 200 mL/hr over 60 Minutes Intravenous  Once 03/30/21 0005 03/30/21 0146       Objective:  Vital Signs  Vitals:   03/30/21 0300 03/30/21 0409 03/30/21 0512 03/30/21 0915  BP: 107/81 118/80    Pulse: 94 95 94   Resp: 20 20 18    Temp:  98.2 F (36.8 C)    TempSrc:  Oral    SpO2: 97% 95% 97% 96%  Weight:  88.8 kg    Height:  5\' 5"  (1.651 m)      Intake/Output Summary (Last 24 hours) at 03/30/2021 1118 Last data filed at 03/30/2021 1100 Gross per 24 hour  Intake 230.56 ml  Output 450 ml  Net -219.44 ml   Filed Weights   03/29/21 1753 03/29/21 2120 03/30/21 0409  Weight: 90.7 kg 89.2 kg 88.8 kg    General appearance: Awake alert.  In no distress Resp: Clear to auscultation bilaterally.  Normal effort Cardio: S1-S2 is normal regular.  No S3-S4.  No rubs murmurs or bruit GI: Abdomen is soft.  Nontender nondistended.  Bowel sounds are present normal.  No masses organomegaly Extremities: No edema.  Full range of motion of lower extremities. Neurologic: Alert and oriented x3.  No focal neurological deficits.    Lab Results:  Data Reviewed: I have personally reviewed following labs and imaging studies  CBC: Recent Labs  Lab 03/29/21 1811  03/30/21 0711  WBC 5.5 5.6  HGB 13.6 13.3  HCT 44.5 43.5  MCV 89.4 89.5  PLT 177 329    Basic Metabolic Panel: Recent Labs  Lab 03/29/21 1811 03/30/21 0711  NA 133* 134*  K 4.5 4.9  CL 97* 101  CO2 25 23  GLUCOSE 162* 150*  BUN 84* 86*  CREATININE 3.45* 2.95*  CALCIUM 10.3 10.1  PHOS 4.3  --     GFR: Estimated Creatinine Clearance: 21.1 mL/min (A) (by C-G formula based on SCr of 2.95 mg/dL (H)).  Liver Function Tests: Recent Labs  Lab 03/29/21 1811  AST 16  ALT 16  ALKPHOS 131*  BILITOT 0.7  PROT 7.7  ALBUMIN 4.1  CBG: Recent Labs  Lab 03/30/21 0337  GLUCAP 162*     Recent Results (from the past 240 hour(s))  Resp Panel by RT-PCR (Flu A&B, Covid) Nasopharyngeal Swab     Status: None   Collection Time: 03/30/21 12:30 AM   Specimen: Nasopharyngeal Swab; Nasopharyngeal(NP) swabs in vial transport medium  Result Value Ref Range Status   SARS Coronavirus 2 by RT PCR NEGATIVE NEGATIVE Final    Comment: (NOTE) SARS-CoV-2 target nucleic acids are NOT DETECTED.  The SARS-CoV-2 RNA is generally detectable in upper respiratory specimens during the acute phase of infection. The lowest concentration of SARS-CoV-2 viral copies this assay can detect is 138 copies/mL. A negative result does not preclude SARS-Cov-2 infection and should not be used as the sole basis for treatment or other patient management decisions. A negative result may occur with  improper specimen collection/handling, submission of specimen other than nasopharyngeal swab, presence of viral mutation(s) within the areas targeted by this assay, and inadequate number of viral copies(<138 copies/mL). A negative result must be combined with clinical observations, patient history, and epidemiological information. The expected result is Negative.  Fact Sheet for Patients:  EntrepreneurPulse.com.au  Fact Sheet for Healthcare Providers:   IncredibleEmployment.be  This test is no t yet approved or cleared by the [place] FDA and  has been authorized for detection and/or diagnosis of SARS-CoV-2 by FDA under an Emergency Use Authorization (EUA). This EUA will remain  in effect (meaning this test can be used) for the duration of the COVID-19 declaration under Section 564(b)(1) of the Act, 21 U.S.C.section 360bbb-3(b)(1), unless the authorization is terminated  or revoked sooner.       Influenza A by PCR NEGATIVE NEGATIVE Final   Influenza B by PCR NEGATIVE NEGATIVE Final    Comment: (NOTE) The Xpert Xpress SARS-CoV-2/FLU/RSV plus assay is intended as an aid in the diagnosis of influenza from Nasopharyngeal swab specimens and should not be used as a sole basis for treatment. Nasal washings and aspirates are unacceptable for Xpert Xpress SARS-CoV-2/FLU/RSV testing.  Fact Sheet for Patients: EntrepreneurPulse.com.au  Fact Sheet for Healthcare Providers: IncredibleEmployment.be  This test is not yet approved or cleared by the [place] FDA and has been authorized for detection and/or diagnosis of SARS-CoV-2 by FDA under an Emergency Use Authorization (EUA). This EUA will remain in effect (meaning this test can be used) for the duration of the COVID-19 declaration under Section 564(b)(1) of the Act, 21 U.S.C. section 360bbb-3(b)(1), unless the authorization is terminated or revoked.  Performed at [facility], [address] [place], [place] [address]       Radiology Studies: CT Renal Stone Study  Result Date: 03/29/2021 CLINICAL DATA:  Intermittent left flank pain x2 weeks EXAM: CT ABDOMEN AND PELVIS WITHOUT CONTRAST TECHNIQUE: Multidetector CT imaging of the abdomen and pelvis was performed following the standard protocol without IV contrast. COMPARISON:  08/11/2014 FINDINGS: Lower chest: Lung bases are clear. Hepatobiliary:  Unenhanced liver is unremarkable. Gallbladder is unremarkable. No intrahepatic or extrahepatic duct dilatation. Pancreas: Within normal limits. Spleen: Within normal limits. Adrenals/Urinary Tract: Mild low-density thickening of the left adrenal gland (series 2/image 25), likely reflecting a benign adrenal adenoma. Right adrenal gland is within normal limits. Bilateral nonobstructing renal calculi measuring 3 mm right lower pole and 2 mm in the left lower pole (series 2/image 41). No hydronephrosis. Bilateral renal cysts, measuring up to 2.0 cm in the lateral left upper kidney (series 2/image 32). 12 mm hemorrhagic cyst in the anterior  left lower kidney (series 2/image 40). 2.3 cm endophytic polypoid lesion in the left lower pole (series 2/image 39), poorly evaluated on unenhanced CT, but neoplasm including collecting system TCC is not excluded. Bladder is within normal limits. Stomach/Bowel: Stomach is notable for a tiny hiatal hernia. No evidence of bowel obstruction. Normal appendix (series 2/image 55). No colonic wall thickening or inflammatory changes. 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 Atherosclerotic calcifications of the abdominal aorta and branch vessels. Small para-aortic lymph nodes, including a 10 mm short axis left para-aortic node (series 2/image 38), new. Reproductive: Prostate is diminutive. Other: No abdominopelvic ascites. Small fat containing left paramidline infraumbilical ventral hernia containing a knuckle of sigmoid colon (series 2/image 64). Tiny fat containing periumbilical hernia. Mild laxity of the midline supraumbilical anterior abdominal wall (series 2/image 52). 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 IMPRESSION: 2.3 cm endophytic polypoid lesion in the left lower kidney, poorly evaluated  on unenhanced CT, neoplasm such as collecting system TCC not excluded. Consider CT abdomen with/without contrast for further evaluation. Bilateral nonobstructing lower pole renal calculi measuring 2-3 mm. No hydronephrosis. Small para-nodes measuring up to 10 mm short axis, new. Attention on follow-up is suggested. 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 Electronically Signed   By: Julian Hy M.D.   On: 03/29/2021 23:28       LOS: 0 days   [facility] Pager on [email or web address]  03/30/2021, 11:18 AM

## 2021-03-30 NOTE — H&P (Signed)
History and Physical    GERBER PENZA WEX:[id number] DOB: 1940/11/04 DOA: 03/29/2021  PCP: [name], MD   Patient coming from: Home via [place] [facility]  Chief Complaint: Left flank pain  HPI: [name] is a 80 y.o. male with medical history significant for CKD 3A, DMT2, HFpEF, HTN, AAA, hx of BPH and prostate cancer, OSA who presents for evalaution of left flank pain.  He reports he has had a yeast infection in his groin for the last few weeks and he has been on Diflucan at home.  His last dose was yesterday.  He reports has been having some urinary urgency with mild dysuria.  He reports he had kidney stones in 1969 but none since then.  He states he has not had any fevers chills nausea vomiting or change in bowel habits.  Reports the left flank pain is sharp and stabbing when it occurs and has been intermittent.  The last 2 days it has been worse.  It is aggravated by direct palpation of his left flank and is improved if he sits and rests.  He does not take any medications at home for the pain.  Reports he has not had any blood in his urine.  He denies any trauma to his abdomen or flank region.  ED Course: He has been hemodynamically stable in the emergency room.  He was found to have a neoplasm in the left kidney in the lower pole.  He also been found to have sclerotic lesions in multiple bones and he has a history of prostate cancer which he denies but states he has had 3 prostate surgeries but the chart states he had prostate cancer in the past.  He is found to have AKI superimposed on CKD stage IIIa.  Lab work reveals CBC that is unremarkable.  Sodium 133 potassium 4.5 chloride 97 bicarb 25 creatinine 3.45 from baseline of 2.1-2.2 with BUN of 84 from baseline of 32-40.  Alkaline phosphatase 131 AST 16 ALT 16 bilirubin 0.7 glucose 162.  COVID-19, influenza A and B are all negative.  Urinalysis is cloudy yellow with large leukocyte esterase and greater than 50 WBCs.  Patient was started on  ciprofloxacin in the emergency room.  Hospital service was asked to admit for further management  Review of Systems:  General: Denies fever, chills, weight loss, night sweats.  Denies dizziness. Denies change in appetite HENT: Denies head trauma, headache, denies change in hearing, tinnitus. Denies nasal congestion. Denies sore throat, Denies difficulty swallowing Eyes: Denies blurry vision, pain in eye, drainage.  Denies discoloration of eyes. Neck: Denies pain.  Denies swelling.  Denies pain with movement. Cardiovascular: Denies chest pain, palpitations.  Denies edema.  Denies orthopnea Respiratory: Denies shortness of breath, cough.  Denies wheezing.  Denies sputum production Gastrointestinal: Reports lower abdominal pain, Denies nausea, vomiting, diarrhea.  Denies melena.  Denies hematemesis. Musculoskeletal: Denies limitation of movement.  Denies deformity or swelling.  Denies pain.  Denies arthralgias or myalgias. Genitourinary: Reports left flank pain. Denies pelvic pain.  Denies urinary frequency or hesitancy.  Denies dysuria.  Skin: Recent yeast infection in groin. Denies petechiae, purpura, ecchymosis. Neurological: Denies syncope.  Denies seizure activity.  Denies slurred speech, drooping face.  Denies visual change. Psychiatric: Denies depression, anxiety. Denies hallucinations.  Past Medical History:  Diagnosis Date   AAA (abdominal aortic aneurysm) ([place])    5/14 CT showed > 6 cm AAA, also right iliac aneurysm. Not stent graft candidate.    Anemia of chronic  disease    Anxiety    pt. admits that he has anxiety at times    Aortic stenosis    Moderate to severe by echo in 4/14. Bioprosthetic #21 [facility] Ease aortic valve replacement in 4/14.    Arthritis    CAD (coronary artery disease)    a. 4/14 CABG: LIMA-LAD, seq SVG-ramus & OM1, SVG-PDA   Carotid arterial disease (HCC)    Carotid dopplers (0/53) with 97-67% LICA stenosis.    CHF (congestive heart failure) ([place])  07/2012; 10/17/2012   Chronic kidney disease    increased creatinine recently - being followed, near kidney failure fr. contrast dye    Chronic systolic heart failure ([place])    a. 1/14 ECHO: sev dil LV, EF30-35%, diff HK, mild MR, AS severe, AV grad 35 b. 8/14 ECHO: EF 55-60%, mild biopros AV sten mn grad. 25, RV mild dil, RA mild dil   Complication of anesthesia    during last surgery had to be given special medicine b/c "something dropped" during surgery    COPD (chronic obstructive pulmonary disease) ([place])    a.  History of heavy smoking. PFTs (4/14) with mixed obstructive (COPD) and restrictive (post-ARDS) picture.    Diabetes mellitus without complication ([place])    Esophageal reflux    History of blood transfusion    "lots since January" (10/17/2012)   Hyperlipidemia    Hypertension    Ischemic cardiomyopathy    Ischemic cardiomyopathy    a. 4/14 LHC: pLAD 95, ost ramus 70-80, mLCx 90, EF 30%   OSA (obstructive sleep apnea)    on cpap- every sleep time.    Pneumonia 07/2012   PNA (H1N1 influenza + pneumococcus) in 3/41 complicated by respiratory failure/ARDS. Required tracheostomy, now weaned off. Had left empyema requiring chest tube.    Pneumonia due to COVID-19 virus 03/22/2019   Prediabetes    Prostate cancer [facility])     s/p radiation treatment. - (PT. DENIES)Has indwelling foley.    Shortness of breath    still goes deer hunting by himself    Past Surgical History:  Procedure Laterality Date   ABDOMINAL AORTIC ANEURYSM REPAIR N/A 07/10/2013   Procedure: Resection and Graftiong of perirenal AAA; Insertion 14 x 8 Hemashield Graft Aorta to Left Common Iliac and to Right Common Femoral Artery With Ligastion of Right External and Interanl Iliac Artery;  Surgeon: Mal Misty, MD;  Location: [place];  Service: Vascular;  Laterality: N/A;   ANAL FISSURE REPAIR  2008   AORTIC VALVE REPLACEMENT N/A 10/25/2012   Procedure: AORTIC VALVE REPLACEMENT (AVR);  Surgeon: Melrose Nakayama, MD;   Location: [place];  Service: Open Heart Surgery;  Laterality: N/A;   CARDIAC VALVE REPLACEMENT     CORONARY ARTERY BYPASS GRAFT N/A 10/25/2012   Procedure: CORONARY ARTERY BYPASS GRAFTING (CABG);  Surgeon: Melrose Nakayama, MD;  Location: [place];  Service: Open Heart Surgery;  Laterality: N/A;  times 4 using left internal mammary artery and endoscopically harvested bilateral saphenous vein    EP IMPLANTABLE DEVICE N/A 09/22/2015   Procedure: BiV Upgrade;  Surgeon: Deboraha Sprang, MD;  Location: [place] [facility];  Service: Cardiovascular;  Laterality: N/A;   INTRAOPERATIVE TRANSESOPHAGEAL ECHOCARDIOGRAM N/A 10/25/2012   Procedure: INTRAOPERATIVE TRANSESOPHAGEAL ECHOCARDIOGRAM;  Surgeon: Melrose Nakayama, MD;  Location: [place];  Service: Open Heart Surgery;  Laterality: N/A;   LEFT AND RIGHT HEART CATHETERIZATION WITH CORONARY ANGIOGRAM N/A 10/22/2012   Procedure: LEFT AND RIGHT HEART CATHETERIZATION WITH  CORONARY ANGIOGRAM;  Surgeon: Larey Dresser, MD;  Location[facility] C[facility] LAB;  Service: Cardiovascular;  Laterality: N/A;   NASAL FRACTURE SURGERY  1970's   PACEMAKER REVISION  09/22/2015   pacemaker upgrade    PERMANENT PACEMAKER INSERTION N/A 07/15/2014   Procedure: PERMANENT PACEMAKER INSERTION;  Surgeon: Deboraha Sprang, MD;  Location: [facility] C[facility] LAB;  Service: Cardiovascular;  Laterality: N/A;   TRACHEOSTOMY  07/2012   TRACHEOSTOMY CLOSURE  08/2012   TRANSURETHRAL MICROWAVE THERAPY  10/15/2012    Social History  reports that he quit smoking about 8 years ago. His smoking use included cigarettes. He has a 116.00 pack-year smoking history. He has never used smokeless tobacco. He reports that he does not drink alcohol and does not use drugs.  Allergies  Allergen Reactions   Iodinated Diagnostic Agents Other (See Comments)    Decreased kidney function   Omnipaque [Iohexol] Other (See Comments)    Decreased kidney function   Primaxin [Imipenem] Hives   Cephalosporins Rash    Blisters     Lisinopril Nausea And Vomiting    Drops blood pressure    Clindamycin/Lincomycin Swelling and Rash    Family History  Problem Relation Age of Onset   Cancer Mother    Heart disease Father    Congestive Heart Failure Other    Heart disease Other      Prior to Admission medications   Medication Sig Start Date End Date Taking? Authorizing Provider  acetaminophen (TYLENOL) 500 MG tablet Take 1 tablet (500 mg total) by mouth every 6 (six) hours as needed. 02/23/21  Yes Larey Dresser, MD  albuterol (VENTOLIN HFA) 108 (90 Base) MCG/ACT inhaler Inhale 2 puffs into the lungs every 6 (six) hours as needed for wheezing or shortness of breath. 02/22/21  Yes Rigoberto Noel, MD  allopurinol (ZYLOPRIM) 300 MG tablet Take 300 mg by mouth daily.   Yes [provider]  aspirin EC 81 MG tablet Take 81 mg by mouth daily.   Yes [provider]  atorvastatin (LIPITOR) 80 MG tablet TAKE 1 TABLET (80 MG TOTAL) BY MOUTH DAILY BEFORE BREAKFAST. 10/26/20  Yes Larey Dresser, MD  bisoprolol (ZEBETA) 5 MG tablet TAKE 1/2 TABLETS (2.5 MG TOTAL) BY MOUTH DAILY. 08/04/20  Yes Larey Dresser, MD  calcitRIOL (ROCALTROL) 0.25 MCG capsule Take 0.25 mcg by mouth daily.    Yes [provider]  dextromethorphan-guaiFENesin (MUCINEX DM) 30-600 MG 12hr tablet Take 1 tablet by mouth 2 (two) times daily.   Yes [provider]  empagliflozin (JARDIANCE) 10 MG TABS tablet Take 1 tablet (10 mg total) by mouth daily before breakfast. 11/19/20  Yes Larey Dresser, MD  fexofenadine (ALLEGRA) 180 MG tablet Take 90 mg by mouth daily.    Yes [provider]  Fluticasone-Umeclidin-Vilant (TRELEGY ELLIPTA) 100-62.5-25 MCG/INH AEPB Inhale 1 puff into the lungs daily. 03/09/21  Yes Rigoberto Noel, MD  glucose blood (ONETOUCH VERIO) test strip Use to check blood sugar once a day. 05/26/20  Yes Philemon Kingdom, MD  insulin degludec (TRESIBA FLEXTOUCH) 200 UNIT/ML FlexTouch Pen Inject 28 Units into  the skin daily. Patient taking differently: Inject 16 Units into the skin daily. 01/13/21  Yes Philemon Kingdom, MD  Insulin Pen Needle 31G X 4 MM MISC 1 each by Does not apply route daily. Use to inject insulin daily 06/17/18  Yes Philemon Kingdom, MD  Omega-3 Fatty Acids (FISH OIL MAXIMUM STRENGTH PO) Take 1 capsule by mouth in the morning  and at bedtime.    Yes [provider]  OZEMPIC, 1 MG/DOSE, 4 MG/3ML SOPN INJECT 1 MG INTO THE SKIN ONCE A WEEK 02/28/21  Yes Philemon Kingdom, MD  pantoprazole (PROTONIX) 40 MG tablet Take 40 mg by mouth at bedtime.  12/17/12  Yes [provider]  Potassium Chloride (KLOR-CON 10 PO) Take 2 tablets by mouth daily in the afternoon.   Yes [provider]  SITagliptin Phosphate (JANUVIA PO) Take 10 mg by mouth.   Yes [provider]  spironolactone (ALDACTONE) 25 MG tablet Take 0.5 tablets (12.5 mg total) by mouth daily. 02/23/21  Yes Larey Dresser, MD  torsemide (DEMADEX) 20 MG tablet Take 2 tablets (40 mg total) by mouth in the morning AND 1 tablet (20 mg total) every evening. 11/26/20  Yes Larey Dresser, MD  amoxicillin (AMOXIL) 500 MG capsule TAKE 4 CAPS 1 HOUR PRIOR TO TREATMENT 07/10/19   [provider]  Blood Glucose Calibration (ONETOUCH VERIO) SOLN Use to calibrate one touch verio level 3 05/31/20   Philemon Kingdom, MD    Physical Exam: Vitals:   03/30/21 0030 03/30/21 0132 03/30/21 0300 03/30/21 0409  BP: 107/79 133/87 107/81 118/80  Pulse: 91 90 94 95  Resp: 20 20 20 20   Temp:    98.2 F (36.8 C)  TempSrc:    Oral  SpO2: 100% 98% 97% 95%  Weight:    88.8 kg  Height:    5\' 5"  (1.651 m)    Constitutional: NAD, calm, comfortable Vitals:   03/30/21 0030 03/30/21 0132 03/30/21 0300 03/30/21 0409  BP: 107/79 133/87 107/81 118/80  Pulse: 91 90 94 95  Resp: 20 20 20 20   Temp:    98.2 F (36.8 C)  TempSrc:    Oral  SpO2: 100% 98% 97% 95%  Weight:    88.8 kg  Height:    5\' 5"  (1.651 m)    General: WDWN, Alert and oriented x3.  Eyes: EOMI, PERRL, conjunctivae normal. Sclera nonicteric HENT:  [place]/AT, external ears normal.  Nares patent without epistasis.  Mucous membranes are moist.  Neck: Soft, normal range of motion, supple, no masses, Trachea midline Respiratory: clear to auscultation bilaterally, no wheezing, no crackles. Normal respiratory effort. No accessory muscle use.  Cardiovascular: Regular rate and rhythm, no murmurs / rubs / gallops. No extremity edema. 2+ pedal pulses.  Abdomen: Soft, Has left flank tenderness, nondistended, no rebound or guarding.  No masses palpated. Obese. Bowel sounds normoactive Musculoskeletal: FROM. no cyanosis. No joint deformity upper and lower extremities. Normal muscle tone.  Skin: Warm, dry, intact no rashes, lesions, ulcers. No induration Neurologic: CN 2-12 grossly intact.  Normal speech.  Sensation intact, Strength 5/5 in all extremities.   Psychiatric: Normal judgment and insight. Normal mood and affect.    Labs on Admission: I have personally reviewed following labs and imaging studies  CBC: Recent Labs  Lab 03/29/21 1811  WBC 5.5  HGB 13.6  HCT 44.5  MCV 89.4  PLT 563    Basic Metabolic Panel: Recent Labs  Lab 03/29/21 1811  NA 133*  K 4.5  CL 97*  CO2 25  GLUCOSE 162*  BUN 84*  CREATININE 3.45*  CALCIUM 10.3  PHOS 4.3    GFR: Estimated Creatinine Clearance: 18.1 mL/min (A) (by C-G formula based on SCr of 3.45 mg/dL (H)).  Liver Function Tests: Recent Labs  Lab 03/29/21 1811  AST 16  ALT 16  ALKPHOS 131*  BILITOT 0.7  PROT  7.7  ALBUMIN 4.1    Urine analysis:    Component Value Date/Time   COLORURINE YELLOW 03/29/2021 1811   APPEARANCEUR CLOUDY (A) 03/29/2021 1811   LABSPEC 1.008 03/29/2021 1811   PHURINE 5.5 03/29/2021 1811   GLUCOSEU 250 (A) 03/29/2021 1811   HGBUR SMALL (A) 03/29/2021 1811   BILIRUBINUR NEGATIVE 03/29/2021 1811   KETONESUR NEGATIVE 03/29/2021 1811   PROTEINUR 30 (A)  03/29/2021 1811   UROBILINOGEN 0.2 07/11/2013 1345   NITRITE NEGATIVE 03/29/2021 1811   LEUKOCYTESUR LARGE (A) 03/29/2021 1811    Radiological Exams on Admission: CT Renal Stone Study  Result Date: 03/29/2021 CLINICAL DATA:  Intermittent left flank pain x2 weeks EXAM: CT ABDOMEN AND PELVIS WITHOUT CONTRAST TECHNIQUE: Multidetector CT imaging of the abdomen and pelvis was performed following the standard protocol without IV contrast. COMPARISON:  08/11/2014 FINDINGS: Lower chest: Lung bases are clear. Hepatobiliary: Unenhanced liver is unremarkable. Gallbladder is unremarkable. No intrahepatic or extrahepatic duct dilatation. Pancreas: Within normal limits. Spleen: Within normal limits. Adrenals/Urinary Tract: Mild low-density thickening of the left adrenal gland (series 2/image 25), likely reflecting a benign adrenal adenoma. Right adrenal gland is within normal limits. Bilateral nonobstructing renal calculi measuring 3 mm right lower pole and 2 mm in the left lower pole (series 2/image 41). No hydronephrosis. Bilateral renal cysts, measuring up to 2.0 cm in the lateral left upper kidney (series 2/image 32). 12 mm hemorrhagic cyst in the anterior left lower kidney (series 2/image 40). 2.3 cm endophytic polypoid lesion in the left lower pole (series 2/image 39), poorly evaluated on unenhanced CT, but neoplasm including collecting system TCC is not excluded. Bladder is within normal limits. Stomach/Bowel: Stomach is notable for a tiny hiatal hernia. No evidence of bowel obstruction. Normal appendix (series 2/image 55). No colonic wall thickening or inflammatory changes. 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 Atherosclerotic calcifications of the abdominal aorta and branch vessels. Small para-aortic lymph nodes, including a 10 mm short axis left para-aortic node (series 2/image 38), new. Reproductive: Prostate is  diminutive. Other: No abdominopelvic ascites. Small fat containing left paramidline infraumbilical ventral hernia containing a knuckle of sigmoid colon (series 2/image 64). Tiny fat containing periumbilical hernia. Mild laxity of the midline supraumbilical anterior abdominal wall (series 2/image 52). 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 IMPRESSION: 2.3 cm endophytic polypoid lesion in the left lower kidney, poorly evaluated on unenhanced CT, neoplasm such as collecting system TCC not excluded. Consider CT abdomen with/without contrast for further evaluation. Bilateral nonobstructing lower pole renal calculi measuring 2-3 mm. No hydronephrosis. Small para-nodes measuring up to 10 mm short axis, new. Attention on follow-up is suggested. 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 Electronically Signed   By: Julian Hy M.D.   On: 03/29/2021 23:28    Assessment/Plan Principal Problem:   Acute renal failure superimposed on stage 3 chronic kidney disease  Mr. Veron is admitted to hospital. He is started on IVF hydration with LR at 75 ml/hr.  Creatinine 3.45 from baseline of 2. Monitor renal function and electrolytes with labs.  Avoid nephrotoxic medications Has mild hyponatremia.   Active Problems:   Renal neoplasm Has new neoplasm in left lower pole of kidney.  Consult urology for further evaluation    COPD (chronic obstructive pulmonary disease)  Continue home dose of Trelegy.  Albuterol  as needed.  Supplement oxygen be provided as needed to maintain O2 sat between 92 to 96%    Urinary tract infection without hematuria Patient placed on ciprofloxacin in the emergency room.  He has allergies to imipenem and cephalosporins.    Type 2 diabetes mellitus with stage 3 chronic kidney disease  Continue basal insulin. Continue  Jardiance for DM and hx of HFpEF.  Check HgbA1c Monitor blood sugars with meals and at bedtime.  Corrective insulin provided as needed for glycemic control    Metastatic cancer to bone Has hx of prostate cancer per chart and these lesions are new from previous scan     Left flank pain Dilaudid for pain control. Urology consulted as above     OSA Continue CPAP at night as he uses it at home   DVT prophylaxis: Heparin for DVT prophylaxis  Code Status:   Full Code  Family Communication:  Diagnosis and plan discussed with patient.  Patient verbalized understanding agrees with plan.  Further recommendations to follow as clinical indicated Disposition Plan:   Patient is from:  Home  Anticipated DC to:  Home  Anticipated DC date:  Anticipate more than 2 midnight stay in hospital to treat   Consults called:  Urology, Dr. Leeroy Bock  Admission status:  Inpatient   Yevonne Aline Maebry Obrien MD [facility]  How to contact the [facility] Attending or Consulting provider [place] or covering provider during after hours [place], for this patient?   Check the care team in [facility] and look for a) attending/consulting [facility] provider listed and b) the [facility] team listed Log into [email or web address] and use [name]'s universal password to access. If you do not have the password, please contact the hospital operator. Locate the [facility] provider you are looking for under [facility] and page to a number that you can be directly reached. If you still have difficulty reaching the provider, please page the [facility] (Director on Call) for the Hospitalists listed on amion for assistance.  03/30/2021, 4:58 AM

## 2021-03-30 NOTE — ED Notes (Signed)
Report given to [facility] and Microbiologist at [place]

## 2021-03-30 NOTE — ED Notes (Signed)
Pt has been transferred to [facility] via [facility] - stable

## 2021-03-30 NOTE — Plan of Care (Signed)
  Problem: Education: Goal: Knowledge of General Education information will improve Description Including pain rating scale, medication(s)/side effects and non-pharmacologic comfort measures Outcome: Progressing   Problem: Health Behavior/Discharge Planning: Goal: Ability to manage health-related needs will improve Outcome: Progressing   

## 2021-03-30 NOTE — Consult Note (Signed)
Urology Consult Note   Requesting Attending Physician:  Vernelle Emerald, MD Service Providing Consult: Urology  Consulting Attending: Louis Meckel, MD   Reason for Consult:  Left renal mass  HPI: [name] is seen in consultation for reasons noted above at the request of Shalhoub, Sherryll Burger, MD for evaluation of left renal mass.  This is a 80 y.o. male with Hx of T2DM, HFpEF, HTN, AAA, CKD, OSA, BPH, and possible prostate cancer (although patient denies) who presented to the ED due to ongoing left flank pain for two days. Workup was notable for AKI (3.45 from baseline of 2.2) as well as a newly diagnosed left lower pole renal mass (2.3 cm, endophytic, concerning for possible TCC) with para-aortic adenopathy. He also notably has sclerotic lesions to several bones. UA with possible infection, so started on ciprofloxacin. Urine culture is pending.    Past Medical History: Past Medical History:  Diagnosis Date   AAA (abdominal aortic aneurysm) ([place])    5/14 CT showed > 6 cm AAA, also right iliac aneurysm. Not stent graft candidate.    Anemia of chronic disease    Anxiety    pt. admits that he has anxiety at times    Aortic stenosis    Moderate to severe by echo in 4/14. Bioprosthetic #21 [facility] Ease aortic valve replacement in 4/14.    Arthritis    CAD (coronary artery disease)    a. 4/14 CABG: LIMA-LAD, seq SVG-ramus & OM1, SVG-PDA   Carotid arterial disease (HCC)    Carotid dopplers (1/54) with 00-86% LICA stenosis.    CHF (congestive heart failure) ([place]) 07/2012; 10/17/2012   Chronic kidney disease    increased creatinine recently - being followed, near kidney failure fr. contrast dye    Chronic systolic heart failure ([place])    a. 1/14 ECHO: sev dil LV, EF30-35%, diff HK, mild MR, AS severe, AV grad 35 b. 8/14 ECHO: EF 55-60%, mild biopros AV sten mn grad. 25, RV mild dil, RA mild dil   Complication of anesthesia    during last surgery had to be given special  medicine b/c "something dropped" during surgery    COPD (chronic obstructive pulmonary disease) ([place])    a.  History of heavy smoking. PFTs (4/14) with mixed obstructive (COPD) and restrictive (post-ARDS) picture.    Diabetes mellitus without complication ([place])    Esophageal reflux    History of blood transfusion    "lots since January" (10/17/2012)   Hyperlipidemia    Hypertension    Ischemic cardiomyopathy    Ischemic cardiomyopathy    a. 4/14 LHC: pLAD 95, ost ramus 70-80, mLCx 90, EF 30%   OSA (obstructive sleep apnea)    on cpap- every sleep time.    Pneumonia 07/2012   PNA (H1N1 influenza + pneumococcus) in 7/61 complicated by respiratory failure/ARDS. Required tracheostomy, now weaned off. Had left empyema requiring chest tube.    Pneumonia due to COVID-19 virus 03/22/2019   Prediabetes    Prostate cancer [facility])     s/p radiation treatment. - (PT. DENIES)Has indwelling foley.    Shortness of breath    still goes deer hunting by himself    Past Surgical History:  Past Surgical History:  Procedure Laterality Date   ABDOMINAL AORTIC ANEURYSM REPAIR N/A 07/10/2013   Procedure: Resection and Graftiong of perirenal AAA; Insertion 14 x 8 Hemashield Graft Aorta to Left Common Iliac and to Right Common Femoral Artery With Ligastion of Right External and  Interanl Iliac Artery;  Surgeon: Mal Misty, MD;  Location: [place];  Service: Vascular;  Laterality: N/A;   ANAL FISSURE REPAIR  2008   AORTIC VALVE REPLACEMENT N/A 10/25/2012   Procedure: AORTIC VALVE REPLACEMENT (AVR);  Surgeon: Melrose Nakayama, MD;  Location: [place];  Service: Open Heart Surgery;  Laterality: N/A;   CARDIAC VALVE REPLACEMENT     CORONARY ARTERY BYPASS GRAFT N/A 10/25/2012   Procedure: CORONARY ARTERY BYPASS GRAFTING (CABG);  Surgeon: Melrose Nakayama, MD;  Location: [place];  Service: Open Heart Surgery;  Laterality: N/A;  times 4 using left internal mammary artery and endoscopically harvested bilateral saphenous  vein    EP IMPLANTABLE DEVICE N/A 09/22/2015   Procedure: BiV Upgrade;  Surgeon: Deboraha Sprang, MD;  Location: [place] [facility];  Service: Cardiovascular;  Laterality: N/A;   INTRAOPERATIVE TRANSESOPHAGEAL ECHOCARDIOGRAM N/A 10/25/2012   Procedure: INTRAOPERATIVE TRANSESOPHAGEAL ECHOCARDIOGRAM;  Surgeon: Melrose Nakayama, MD;  Location: [place];  Service: Open Heart Surgery;  Laterality: N/A;   LEFT AND RIGHT HEART CATHETERIZATION WITH CORONARY ANGIOGRAM N/A 10/22/2012   Procedure: LEFT AND RIGHT HEART CATHETERIZATION WITH CORONARY ANGIOGRAM;  Surgeon: Larey Dresser, MD;  Location: [facility] CATH LAB;  Service: Cardiovascular;  Laterality: N/A;   NASAL FRACTURE SURGERY  1970's   PACEMAKER REVISION  09/22/2015   pacemaker upgrade    PERMANENT PACEMAKER INSERTION N/A 07/15/2014   Procedure: PERMANENT PACEMAKER INSERTION;  Surgeon: Deboraha Sprang, MD;  [facility] C[facility] LAB;  Service: Cardiovascular;  Laterality: N/A;   TRACHEOSTOMY  07/2012   TRACHEOSTOMY CLOSURE  08/2012   TRANSURETHRAL MICROWAVE THERAPY  10/15/2012    Medication: Current Facility-Administered Medications  Medication Dose Route Frequency Provider Last Rate Last Admin   acetaminophen (TYLENOL) tablet 650 mg  650 mg Oral Q6H PRN Chotiner, Yevonne Aline, MD       Or   acetaminophen (TYLENOL) suppository 650 mg  650 mg Rectal Q6H PRN Chotiner, Yevonne Aline, MD       albuterol (PROVENTIL) (2.5 MG/3ML) 0.083% nebulizer solution 2.5 mg  2.5 mg Nebulization Q4H PRN Shalhoub, Sherryll Burger, MD       aspirin EC tablet 81 mg  81 mg Oral Daily Chotiner, Yevonne Aline, MD       atorvastatin (LIPITOR) tablet 80 mg  80 mg Oral QAC breakfast Chotiner, Yevonne Aline, MD       bisoprolol (ZEBETA) tablet 2.5 mg  2.5 mg Oral Daily Chotiner, Yevonne Aline, MD       ciprofloxacin (CIPRO) IVPB 200 mg  200 mg Intravenous Q12H Chotiner, Yevonne Aline, MD       feeding supplement (ENSURE ENLIVE / ENSURE PLUS) liquid 237 mL  237 mL Oral BID BM Chotiner, Yevonne Aline, MD        fluticasone furoate-vilanterol (BREO ELLIPTA) 100-25 MCG/INH 1 puff  1 puff Inhalation Daily Shalhoub, Sherryll Burger, MD       And   umeclidinium bromide (INCRUSE ELLIPTA) 62.5 MCG/INH 1 puff  1 puff Inhalation Daily Shalhoub, Sherryll Burger, MD       heparin injection 5,000 Units  5,000 Units Subcutaneous Q8H Chotiner, Yevonne Aline, MD   5,000 Units at 03/30/21 0543   HYDROmorphone (DILAUDID) injection 0.5 mg  0.5 mg Intravenous Q3H PRN Chotiner, Yevonne Aline, MD       insulin aspart (novoLOG) injection 0-5 Units  0-5 Units Subcutaneous QHS Chotiner, Yevonne Aline, MD       insulin aspart (novoLOG) injection 0-9 Units  0-9  Units Subcutaneous TID WC Chotiner, Yevonne Aline, MD       insulin glargine-yfgn [facility]) injection 16 Units  16 Units Subcutaneous Daily Shalhoub, Sherryll Burger, MD       lactated ringers infusion   Intravenous Continuous Chotiner, Yevonne Aline, MD 75 mL/hr at 03/30/21 0543 New Bag at 03/30/21 0543   ondansetron (ZOFRAN) tablet 4 mg  4 mg Oral Q6H PRN Chotiner, Yevonne Aline, MD       Or   ondansetron (ZOFRAN) injection 4 mg  4 mg Intravenous Q6H PRN Chotiner, Yevonne Aline, MD       pantoprazole (PROTONIX) EC tablet 40 mg  40 mg Oral QHS Chotiner, Yevonne Aline, MD       senna-docusate (Senokot-S) tablet 1 tablet  1 tablet Oral QHS PRN Chotiner, Yevonne Aline, MD        Allergies: Allergies  Allergen Reactions   Iodinated Diagnostic Agents Other (See Comments)    Decreased kidney function   Omnipaque [Iohexol] Other (See Comments)    Decreased kidney function   Primaxin [Imipenem] Hives   Cephalosporins Rash    Blisters    Lisinopril Nausea And Vomiting    Drops blood pressure    Clindamycin/Lincomycin Swelling and Rash    Social History: Social History   Tobacco Use   Smoking status: Former    Packs/day: 2.00    Years: 58.00    Pack years: 116.00    Types: Cigarettes    Quit date: 07/20/2012    Years since quitting: 8.6   Smokeless tobacco: Never   Tobacco comments:    Remains smoke free   Vaping Use   Vaping Use: Never used  Substance Use Topics   Alcohol use: No    Alcohol/week: 0.0 standard drinks    Comment: 10/17/2012 "years since I've had a drink; never had problem with it"   Drug use: No    Family History Family History  Problem Relation Age of Onset   Cancer Mother    Heart disease Father    Congestive Heart Failure Other    Heart disease Other     Review of Systems 10 systems were reviewed and are negative except as noted specifically in the HPI.  Objective   Vital signs in last 24 hours: BP 118/80 (BP Location: Left Arm)   Pulse 94   Temp 98.2 F (36.8 C) (Oral)   Resp 18   Ht 5\' 5"  (1.651 m)   Wt 88.8 kg   SpO2 97%   BMI 32.57 kg/m   Physical Exam General: NAD, A&O, resting, appropriate HEENT: [place]/AT, EOMI, MMM Pulmonary: Normal work of breathing Cardiovascular: HDS, adequate peripheral perfusion Abdomen: Soft, NTTP, nondistended, large midline laparotomy and sternotomy scar. GU: Normal external male genitalia w/ evidence of buried penis, left CVA tenderness DRE: Normal prostate, ~30 grams, no nodules or significant asymmetry Extremities: warm and well perfused Neuro: Appropriate, no focal neurological deficits  Most Recent Labs: Lab Results  Component Value Date   WBC 5.5 03/29/2021   HGB 13.6 03/29/2021   HCT 44.5 03/29/2021   PLT 177 03/29/2021    Lab Results  Component Value Date   NA 133 (L) 03/29/2021   K 4.5 03/29/2021   CL 97 (L) 03/29/2021   CO2 25 03/29/2021   BUN 84 (H) 03/29/2021   CREATININE 3.45 (H) 03/29/2021   CALCIUM 10.3 03/29/2021   MG 2.4 07/21/2013   PHOS 4.3 03/29/2021    Lab Results  Component Value Date   INR  0.94 09/30/2015   APTT 33 07/10/2013    IMAGING: CT Renal Stone Study  Result Date: 03/29/2021 CLINICAL DATA:  Intermittent left flank pain x2 weeks EXAM: CT ABDOMEN AND PELVIS WITHOUT CONTRAST TECHNIQUE: Multidetector CT imaging of the abdomen and pelvis was performed following the  standard protocol without IV contrast. COMPARISON:  08/11/2014 FINDINGS: Lower chest: Lung bases are clear. Hepatobiliary: Unenhanced liver is unremarkable. Gallbladder is unremarkable. No intrahepatic or extrahepatic duct dilatation. Pancreas: Within normal limits. Spleen: Within normal limits. Adrenals/Urinary Tract: Mild low-density thickening of the left adrenal gland (series 2/image 25), likely reflecting a benign adrenal adenoma. Right adrenal gland is within normal limits. Bilateral nonobstructing renal calculi measuring 3 mm right lower pole and 2 mm in the left lower pole (series 2/image 41). No hydronephrosis. Bilateral renal cysts, measuring up to 2.0 cm in the lateral left upper kidney (series 2/image 32). 12 mm hemorrhagic cyst in the anterior left lower kidney (series 2/image 40). 2.3 cm endophytic polypoid lesion in the left lower pole (series 2/image 39), poorly evaluated on unenhanced CT, but neoplasm including collecting system TCC is not excluded. Bladder is within normal limits. Stomach/Bowel: Stomach is notable for a tiny hiatal hernia. No evidence of bowel obstruction. Normal appendix (series 2/image 55). No colonic wall thickening or inflammatory changes. 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 Atherosclerotic calcifications of the abdominal aorta and branch vessels. Small para-aortic lymph nodes, including a 10 mm short axis left para-aortic node (series 2/image 38), new. Reproductive: Prostate is diminutive. Other: No abdominopelvic ascites. Small fat containing left paramidline infraumbilical ventral hernia containing a knuckle of sigmoid colon (series 2/image 64). Tiny fat containing periumbilical hernia. Mild laxity of the midline supraumbilical anterior abdominal wall (series 2/image 52). Musculoskeletal: Multifocal sclerotic osseous metastases in the visualized thoracolumbar spine, including T11  and L1 through L3, new from 2016. IMPRESSION: 2.3 cm endophytic polypoid lesion in the left lower kidney, poorly evaluated on unenhanced CT, neoplasm such as collecting system TCC not excluded. Consider CT abdomen with/without contrast for further evaluation. Bilateral nonobstructing lower pole renal calculi measuring 2-3 mm. No hydronephrosis. Small para-nodes measuring up to 10 mm short axis, new. Attention on follow-up is suggested. 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 Electronically Signed   By: Julian Hy M.D.   On: 03/29/2021 23:28    ------  Assessment:  80 y.o. male with Hx of T2DM, HFpEF, HTN, AAA, CKD, OSA, BPH, and possible prostate cancer now w/ new onset left flank pain, found to have a 2.3 cm left lower pole renal mass w/ para-aortic adenopathy as well as multifocal sclerotic bone lesions.   Recommendations: - Check PSA given possible history of prostate cancer and bone lesions on scan - Will discuss w/ Radiology regarding best further imaging, likely MRI w/ contrast (although may consider Bone Scan, PET given bony lesions) - If UTUC is the highest concern, may consider URS w/ biopsy in the future   Thank you for this consult. Please contact the urology consult pager with any further questions/concerns.  Reola Mosher, MD [facility] Urology Resident, [facility]

## 2021-03-30 NOTE — Anesthesia Preprocedure Evaluation (Addendum)
Anesthesia Evaluation  Patient identified by MRN, date of birth, ID band Patient awake    Reviewed: Allergy & Precautions, NPO status , Patient's Chart, lab work & pertinent test results, reviewed documented beta blocker date and time   History of Anesthesia Complications (+) history of anesthetic complications  Airway Mallampati: II  TM Distance: >3 FB Neck ROM: Full    Dental no notable dental hx. (+) Teeth Intact, Dental Advisory Given, Caps   Pulmonary shortness of breath and with exertion, sleep apnea and Continuous Positive Airway Pressure Ventilation , pneumonia, resolved, COPD,  COPD inhaler, former smoker,    Pulmonary exam normal breath sounds clear to auscultation       Cardiovascular hypertension, Pt. on medications and Pt. on home beta blockers + CAD, + Cardiac Stents, + CABG, + Peripheral Vascular Disease and +CHF  Normal cardiovascular exam+ dysrhythmias + pacemaker  Rhythm:Regular Rate:Normal  EKG 02/23/21 Atrial sensed, ventricular paced rhythm  S/P CABG x 4 + AVR 10/2012  Hx/o Ischemic CM  Echo 02/23/21 1. Left ventricular ejection fraction, by estimation, is 45 to 50%. The left ventricle has mildly decreased function. The left ventricle demonstrates global hypokinesis. There is mild left ventricular hypertrophy. Left ventricular diastolic parameters are consistent with Grade I diastolic dysfunction (impaired relaxation).  2. Right ventricular systolic function is normal. The right ventricular  size is mildly enlarged. There is normal pulmonary artery systolic  pressure. The estimated right ventricular systolic pressure is 93.2 mmHg.  3. Right atrial size was mildly dilated.  4. The mitral valve is normal in structure. No evidence of mitral valve regurgitation. No evidence of mitral stenosis.  5. Bioprosthetic aortic valve. Mean gradient 21 mmHg suggesting a degree of bioprosthetic valve stenosis, no significant  change from prior. No significant regurgitation.  6. Aortic dilatation noted. There is mild dilatation of the ascending aorta, measuring 38 mm.   Hx/o CHB S/P PPM insertion 7. The inferior vena cava is normal in size with greater than 50% respiratory variability, suggesting right atrial pressure of 3 mmHg.   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    Musculoskeletal   Abdominal (+) + obese,   Peds  Hematology negative hematology ROS (+)   Anesthesia Other Findings   Reproductive/Obstetrics                            Anesthesia Physical Anesthesia Plan  ASA: 3  Anesthesia Plan:    Post-op Pain Management:    Induction: Intravenous  PONV Risk Score and Plan: 4 or greater and Treatment may vary due to age or medical condition and Ondansetron  Airway Management Planned: LMA  Additional Equipment:   Intra-op Plan:   Post-operative Plan: Extubation in OR  Informed Consent: I have reviewed the patients History and Physical, chart, labs and discussed the procedure including the risks, benefits and alternatives for the proposed anesthesia with the patient or authorized representative who has indicated his/her understanding and acceptance.     Dental advisory given  Plan Discussed with: CRNA and Anesthesiologist  Anesthesia Plan Comments:        Anesthesia Quick Evaluation

## 2021-03-30 NOTE — Progress Notes (Signed)
PHARMACY NOTE:  ANTIMICROBIAL RENAL DOSAGE ADJUSTMENT  Current antimicrobial regimen includes a mismatch between antimicrobial dosage and estimated renal function.  As per policy approved by the Pharmacy & Therapeutics and Medical Executive Committees, the antimicrobial dosage will be adjusted accordingly.  Current antimicrobial dosage:  Cipro 400mg  IV q12h  Indication: UTI with multiple antibiotic allergies  Renal Function:  Estimated Creatinine Clearance: 18.1 mL/min (A) (by C-G formula based on SCr of 3.45 mg/dL (H)). []      On intermittent HD, scheduled: []      On CRRT    Antimicrobial dosage has been changed to:  Cipro 200mg  IV q12h  Additional comments:   Thank you for allowing pharmacy to be a part of this patient's care.  Netta Cedars, [facility] 03/30/2021 5:59 AM

## 2021-03-30 NOTE — ED Notes (Addendum)
Pt is hooked up to 2L [place]. States  he uses CPAP at night. His sat drops  to 80s when he tries to sleep. Pt denies SOB. CGS 15

## 2021-03-31 ENCOUNTER — Encounter (HOSPITAL_COMMUNITY): Payer: Self-pay | Admitting: Internal Medicine

## 2021-03-31 ENCOUNTER — Encounter (HOSPITAL_COMMUNITY)
Admission: EM | Disposition: A | Discharge status: Home Health | Payer: Self-pay | Source: Ambulatory Visit | Attending: Internal Medicine

## 2021-03-31 ENCOUNTER — Inpatient Hospital Stay (HOSPITAL_COMMUNITY): Payer: Medicare Other

## 2021-03-31 ENCOUNTER — Inpatient Hospital Stay (HOSPITAL_COMMUNITY): Payer: Medicare Other | Admitting: Anesthesiology

## 2021-03-31 DIAGNOSIS — J432 Centrilobular emphysema: Secondary | ICD-10-CM

## 2021-03-31 DIAGNOSIS — N390 Urinary tract infection, site not specified: Secondary | ICD-10-CM

## 2021-03-31 DIAGNOSIS — N179 Acute kidney failure, unspecified: Secondary | ICD-10-CM | POA: Diagnosis not present

## 2021-03-31 DIAGNOSIS — D49519 Neoplasm of unspecified behavior of unspecified kidney: Secondary | ICD-10-CM | POA: Diagnosis not present

## 2021-03-31 HISTORY — PX: CYSTOSCOPY WITH RETROGRADE PYELOGRAM, URETEROSCOPY AND STENT PLACEMENT: SHX5789

## 2021-03-31 LAB — CBC
HCT: 41.9 % (ref 39.0–52.0)
Hemoglobin: 12.8 g/dL — ABNORMAL LOW (ref 13.0–17.0)
MCH: 27.5 pg (ref 26.0–34.0)
MCHC: 30.5 g/dL (ref 30.0–36.0)
MCV: 89.9 fL (ref 80.0–100.0)
Platelets: 154 10*3/uL (ref 150–400)
RBC: 4.66 MIL/uL (ref 4.22–5.81)
RDW: 18.6 % — ABNORMAL HIGH (ref 11.5–15.5)
WBC: 4.8 10*3/uL (ref 4.0–10.5)
nRBC: 0 % (ref 0.0–0.2)

## 2021-03-31 LAB — GLUCOSE, CAPILLARY
Glucose-Capillary: 150 mg/dL — ABNORMAL HIGH (ref 70–99)
Glucose-Capillary: 101 mg/dL — ABNORMAL HIGH (ref 70–99)
Glucose-Capillary: 90 mg/dL (ref 70–99)
Glucose-Capillary: 155 mg/dL — ABNORMAL HIGH (ref 70–99)

## 2021-03-31 LAB — BASIC METABOLIC PANEL
Anion gap: 9 (ref 5–15)
BUN: 69 mg/dL — ABNORMAL HIGH (ref 8–23)
CO2: 21 mmol/L — ABNORMAL LOW (ref 22–32)
Calcium: 9.8 mg/dL (ref 8.9–10.3)
Chloride: 105 mmol/L (ref 98–111)
Creatinine, Ser: 2.5 mg/dL — ABNORMAL HIGH (ref 0.61–1.24)
GFR, Estimated: 26 mL/min — ABNORMAL LOW (ref >60)
Glucose, Bld: 127 mg/dL — ABNORMAL HIGH (ref 70–99)
Potassium: 4.5 mmol/L (ref 3.5–5.1)
Sodium: 135 mmol/L (ref 135–145)

## 2021-03-31 LAB — URINE CULTURE

## 2021-03-31 LAB — HEMOGLOBIN A1C
Hgb A1c MFr Bld: 7 % — ABNORMAL HIGH (ref 4.8–5.6)
Mean Plasma Glucose: 154 mg/dL

## 2021-03-31 SURGERY — CYSTOURETEROSCOPY, WITH RETROGRADE PYELOGRAM AND STENT INSERTION
Anesthesia: General | Laterality: Left

## 2021-03-31 MED ORDER — PHENYLEPHRINE 40 MCG/ML (10ML) SYRINGE FOR IV PUSH (FOR BLOOD PRESSURE SUPPORT)
PREFILLED_SYRINGE | INTRAVENOUS | Status: DC | PRN
  Administered 2021-03-31: 120 ug via INTRAVENOUS

## 2021-03-31 MED ORDER — FENTANYL CITRATE PF 50 MCG/ML IJ SOSY
25.0000 ug | PREFILLED_SYRINGE | INTRAMUSCULAR | Status: DC | PRN
  Administered 2021-03-31: 50 ug via INTRAVENOUS

## 2021-03-31 MED ORDER — OXYCODONE HCL 5 MG PO TABS: 5.0000 mg | ORAL_TABLET | Freq: Once | ORAL | Status: DC | PRN

## 2021-03-31 MED ORDER — PROPOFOL 10 MG/ML IV BOLUS
INTRAVENOUS | Status: DC | PRN
  Administered 2021-03-31: 110 mg via INTRAVENOUS

## 2021-03-31 MED ORDER — IOHEXOL 300 MG/ML  SOLN
INTRAMUSCULAR | Status: DC | PRN
  Administered 2021-03-31: 10 mL

## 2021-03-31 MED ORDER — LIDOCAINE 2% (20 MG/ML) 5 ML SYRINGE
INTRAMUSCULAR | Status: DC | PRN
  Administered 2021-03-31: 60 mg via INTRAVENOUS

## 2021-03-31 MED ORDER — LACTATED RINGERS IV SOLN: INTRAVENOUS | Status: DC

## 2021-03-31 MED ORDER — CIPROFLOXACIN IN D5W 400 MG/200ML IV SOLN
INTRAVENOUS | Status: AC
  Filled 2021-03-31: qty 200

## 2021-03-31 MED ORDER — ONDANSETRON HCL 4 MG/2ML IJ SOLN
4.0000 mg | Freq: Once | INTRAMUSCULAR | Status: AC | PRN
  Administered 2021-03-31: 4 mg via INTRAVENOUS

## 2021-03-31 MED ORDER — LIDOCAINE HCL (PF) 2 % IJ SOLN
INTRAMUSCULAR | Status: AC
  Filled 2021-03-31: qty 5

## 2021-03-31 MED ORDER — OXYCODONE HCL 5 MG/5ML PO SOLN: 5.0000 mg | Freq: Once | ORAL | Status: DC | PRN

## 2021-03-31 MED ORDER — HALOPERIDOL LACTATE 5 MG/ML IJ SOLN
1.0000 mg | Freq: Four times a day (QID) | INTRAMUSCULAR | Status: DC | PRN
Start: 2021-03-31 — End: 2021-04-03
  Administered 2021-03-31: 1 mg via INTRAVENOUS
  Filled 2021-03-31: qty 1

## 2021-03-31 MED ORDER — PHENYLEPHRINE 40 MCG/ML (10ML) SYRINGE FOR IV PUSH (FOR BLOOD PRESSURE SUPPORT)
PREFILLED_SYRINGE | INTRAVENOUS | Status: AC
  Filled 2021-03-31: qty 10

## 2021-03-31 MED ORDER — CHLORHEXIDINE GLUCONATE CLOTH 2 % EX PADS
6.0000 | MEDICATED_PAD | Freq: Every day | CUTANEOUS | Status: DC
  Administered 2021-03-31 – 2021-04-01 (×2): 6 via TOPICAL

## 2021-03-31 MED ORDER — PROPOFOL 10 MG/ML IV BOLUS
INTRAVENOUS | Status: AC
  Filled 2021-03-31: qty 20

## 2021-03-31 MED ORDER — PHENYLEPHRINE HCL (PRESSORS) 10 MG/ML IV SOLN
INTRAVENOUS | Status: AC
  Filled 2021-03-31: qty 2

## 2021-03-31 MED ORDER — STERILE WATER FOR IRRIGATION IR SOLN
Status: DC | PRN
  Administered 2021-03-31: 500 mL

## 2021-03-31 MED ORDER — PHENYLEPHRINE HCL-NACL 20-0.9 MG/250ML-% IV SOLN
INTRAVENOUS | Status: DC | PRN
  Administered 2021-03-31: 25 ug/min via INTRAVENOUS

## 2021-03-31 MED ORDER — ALLOPURINOL 300 MG PO TABS
300.0000 mg | ORAL_TABLET | Freq: Every day | ORAL | Status: DC
  Administered 2021-03-31 – 2021-04-03 (×4): 300 mg via ORAL
  Filled 2021-03-31 (×4): qty 1

## 2021-03-31 MED ORDER — FENTANYL CITRATE (PF) 100 MCG/2ML IJ SOLN
INTRAMUSCULAR | Status: AC
  Filled 2021-03-31: qty 2

## 2021-03-31 MED ORDER — SODIUM CHLORIDE 0.9 % IR SOLN
Status: DC | PRN
  Administered 2021-03-31: 3000 mL

## 2021-03-31 MED ORDER — ONDANSETRON HCL 4 MG/2ML IJ SOLN
INTRAMUSCULAR | Status: AC
  Filled 2021-03-31: qty 2

## 2021-03-31 MED ORDER — FENTANYL CITRATE (PF) 100 MCG/2ML IJ SOLN
INTRAMUSCULAR | Status: DC | PRN
  Administered 2021-03-31 (×2): 50 ug via INTRAVENOUS

## 2021-03-31 MED ORDER — FENTANYL CITRATE PF 50 MCG/ML IJ SOSY
PREFILLED_SYRINGE | INTRAMUSCULAR | Status: AC
  Filled 2021-03-31: qty 1

## 2021-03-31 SURGICAL SUPPLY — 22 items
BAG URINE DRAIN 2000ML AR STRL (UROLOGICAL SUPPLIES) ×2 IMPLANT
BAG URO CATCHER STRL LF (MISCELLANEOUS) ×2 IMPLANT
BASKET ZERO TIP NITINOL 2.4FR (BASKET) ×2 IMPLANT
CATH CHOLANG 76X19 KUMAR (CATHETERS) ×2 IMPLANT
CATH FOLEY 3WAY 30CC 22FR (CATHETERS) ×2 IMPLANT
CATH URET 5FR 28IN OPEN ENDED (CATHETERS) ×2 IMPLANT
CLOTH BEACON ORANGE TIMEOUT ST (SAFETY) IMPLANT
EXTRACTOR STONE 1.7FRX115CM (UROLOGICAL SUPPLIES) IMPLANT
GLOVE SURG ENC TEXT LTX SZ7.5 (GLOVE) ×2 IMPLANT
GOWN STRL REUS W/TWL XL LVL3 (GOWN DISPOSABLE) ×2 IMPLANT
GUIDEWIRE ANG ZIPWIRE 038X150 (WIRE) IMPLANT
GUIDEWIRE HYBRID NIT STR (WIRE) ×4 IMPLANT
GUIDEWIRE STR DUAL SENSOR (WIRE) IMPLANT
KIT TURNOVER KIT A (KITS) ×2 IMPLANT
MANIFOLD NEPTUNE II (INSTRUMENTS) ×2 IMPLANT
PACK CYSTO (CUSTOM PROCEDURE TRAY) ×2 IMPLANT
PLUG CATH AND CAP STER (CATHETERS) ×2 IMPLANT
SHEATH URETERAL 12FRX28CM (UROLOGICAL SUPPLIES) IMPLANT
SHEATH URETERAL 12FRX35CM (MISCELLANEOUS) IMPLANT
STENT CONTOUR 7FRX24X.038 (STENTS) ×2 IMPLANT
TUBING CONNECTING 10 (TUBING) ×2 IMPLANT
TUBING UROLOGY SET (TUBING) ×2 IMPLANT

## 2021-03-31 NOTE — Op Note (Signed)
Preoperative diagnosis:  Left renal mass, endophytic, suspicious for transitional cell carcinoma Acute on chronic renal failure Enlarged lymph nodes within the left renal hilum and periaortic regions and sclerotic lesions within the vertebral bodies consistent with metastatic disease   Postoperative diagnosis:  Same  Procedure: Cystoscopy, left retrograde pyelogram with interpretation, left ureteral stent placement Left ureteroscopy and biopsy of the renal pelvic urothelium/mass  Surgeon: Ardis Hughs, MD  Anesthesia: General  Complications: None  Intraoperative findings:  #1: Retrograde pyelogram demonstrated normal caliber ureter, the patient had dilated left lower pole, but there was no significant or appreciable filling defect. #2: Ureteroscopy demonstrated some frondular areas, diffusely throughout the renal pelvis, these were biopsied from the upper pole. #3: There was bulging of the renal pelvis and the lower pole which really made it quite difficult to get access into that space, but there was nothing in that area that was able to be biopsied. #4: There was diffuse bleeding within the renal pelvis with large clot all the way down the ureter.  We placed a 7 French x24 cm stent.  EBL: Minimal  Specimens:  1: Left renal pelvic cytology #2: Left renal pelvis biopsy  Indication: [name] is a 80 y.o. patient with left-sided flank pain, acute on chronic renal failure, and CT scan findings suggestive of metastatic disease with enlarged lymph nodes around the left renal hilum and sclerotic regions in the lumbar vertebral bodies as well as a left endophytic renal mass.  Based on the appearance we opted to take the patient to the operating room and attempt for biopsy and further diagnosis..  After reviewing the management options for treatment, he elected to proceed with the above surgical procedure(s). We have discussed the potential benefits and risks of the procedure,  side effects of the proposed treatment, the likelihood of the patient achieving the goals of the procedure, and any potential problems that might occur during the procedure or recuperation. Informed consent has been obtained.  Description of procedure:  The patient was taken to the operating room and general anesthesia was induced.  The patient was placed in the dorsal lithotomy position, prepped and draped in the usual sterile fashion, and preoperative antibiotics were administered. A preoperative time-out was performed.   21 French 0 degree cystoscope was gently passed to the patient's urethra into the bladder under visual guidance.  The ureteral orifice ease were orthotopic.  Cystoscopy was otherwise unremarkable.  5 [place] open-ended ureteral catheter was used to perform retrograde pyelogram with 10 cc of Omnipaque contrast with the above findings.  A wire was then advanced through the opening catheter and into the left renal pelvis before the catheter was removed.  The bladder was drained and the scope removed.  We then advanced a single lumen flexible ureteroscope to the patient's urethra and needed a second wire to cannulate the left ureteral orifice.  We then advanced the scope up into the renal pelvis with the above findings.  We spent quite a bit of time navigating the calyces try to get better orientation.  We injected contrast into the renal pelvis and perform fluoroscopic guided ureteroscopy.  Ultimately we are successful in biopsying an area in the upper pole.  I did try to biopsy some areas within the lower pole, where the bulge from the renal pelvis was, but was unsuccessful in obtaining any tissue in that space.  We did also perform a left renal pelvic washing for cytology.  Once we had completed the biopsies  we slowly backed out the ureteroscope noting no significant trauma, but there was significant amount of clot.  We at that point I opted to place a 7 [place] x24 cm stent.  This was done by  advancing the scope over the wire and then the stent over the wire and into the left renal pelvis under fluoroscopic guidance.  Once it was noted to be well within the renal pelvis was advanced to the bladder neck prior to removing the wire.  The stent tether had been removed.  A 22 French three-way Foley catheter was then placed in the patient's bladder, as there was concern for ongoing gross hematuria given the above findings.  The patient was subsequently awoken and returned to the PACU in stable condition.  Ardis Hughs, M.D.

## 2021-03-31 NOTE — H&P (View-Only) (Signed)
Urology Progress Note   80 y.o. male with Hx of T2DM, HFpEF, HTN, AAA, CKD, OSA, BPH, and possible prostate cancer now w/ new onset left flank pain, found to have a 2.3 cm left lower pole renal mass (possible UTUC due to endophytic nature) w/ para-aortic adenopathy as well as multifocal sclerotic bone lesions.  Subjective: NAEON.  AF, VSS Pain controlled. No nausea Has been NPO since MN Slowly down trending Cr (2.50 today)  Objective: Vital signs in last 24 hours: Temp:  [97.7 F (36.5 C)-98 F (36.7 C)] 97.8 F (36.6 C) (09/29 0500) Pulse Rate:  [90-94] 93 (09/29 0500) Resp:  [17-19] 17 (09/29 0500) BP: (102-121)/(77-83) 121/83 (09/29 0500) SpO2:  [82 %-96 %] 95 % (09/29 0500)  Intake/Output from previous day: 09/28 0701 - 09/29 0700 In: 1552.7 [I.V.:1452.8; IV Piggyback:99.9] Out: 200 [Urine:200] Intake/Output this shift: Total I/O In: 108.7 [I.V.:108.7] Out: -   Physical Exam:  General: Alert and oriented CV: Regular rate Lungs: No increased work of breathing Abdomen: Soft, non-tender. GU: Left CVA tenderness Ext: NT, No erythema  Lab Results: Recent Labs    03/29/21 1811 03/30/21 0711 03/31/21 0332  HGB 13.6 13.3 12.8*  HCT 44.5 43.5 41.9   Recent Labs    03/30/21 0711 03/31/21 0332  NA 134* 135  K 4.9 4.5  CL 101 105  CO2 23 21*  GLUCOSE 150* 127*  BUN 86* 69*  CREATININE 2.95* 2.50*  CALCIUM 10.1 9.8    Studies/Results: CT Renal Stone Study  Result Date: 03/29/2021 CLINICAL DATA:  Intermittent left flank pain x2 weeks EXAM: CT ABDOMEN AND PELVIS WITHOUT CONTRAST TECHNIQUE: Multidetector CT imaging of the abdomen and pelvis was performed following the standard protocol without IV contrast. COMPARISON:  08/11/2014 FINDINGS: Lower chest: Lung bases are clear. Hepatobiliary: Unenhanced liver is unremarkable. Gallbladder is unremarkable. No intrahepatic or extrahepatic duct dilatation. Pancreas: Within normal limits. Spleen: Within normal limits.  Adrenals/Urinary Tract: Mild low-density thickening of the left adrenal gland (series 2/image 25), likely reflecting a benign adrenal adenoma. Right adrenal gland is within normal limits. Bilateral nonobstructing renal calculi measuring 3 mm right lower pole and 2 mm in the left lower pole (series 2/image 41). No hydronephrosis. Bilateral renal cysts, measuring up to 2.0 cm in the lateral left upper kidney (series 2/image 32). 12 mm hemorrhagic cyst in the anterior left lower kidney (series 2/image 40). 2.3 cm endophytic polypoid lesion in the left lower pole (series 2/image 39), poorly evaluated on unenhanced CT, but neoplasm including collecting system TCC is not excluded. Bladder is within normal limits. Stomach/Bowel: Stomach is notable for a tiny hiatal hernia. No evidence of bowel obstruction. Normal appendix (series 2/image 55). No colonic wall thickening or inflammatory changes. 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 Atherosclerotic calcifications of the abdominal aorta and branch vessels. Small para-aortic lymph nodes, including a 10 mm short axis left para-aortic node (series 2/image 38), new. Reproductive: Prostate is diminutive. Other: No abdominopelvic ascites. Small fat containing left paramidline infraumbilical ventral hernia containing a knuckle of sigmoid colon (series 2/image 64). Tiny fat containing periumbilical hernia. Mild laxity of the midline supraumbilical anterior abdominal wall (series 2/image 52). 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 IMPRESSION: 2.3 cm endophytic polypoid lesion in the left lower kidney, poorly evaluated on unenhanced CT, neoplasm such as collecting system TCC not excluded. Consider CT abdomen with/without  contrast for further evaluation. Bilateral nonobstructing lower pole  renal calculi measuring 2-3 mm. No hydronephrosis. Small para-nodes measuring up to 10 mm short axis, new. Attention on follow-up is suggested. 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 Electronically Signed   By: Julian Hy M.D.   On: 03/29/2021 23:28    Assessment/Plan:  80 y.o. male with Hx of T2DM, HFpEF, HTN, AAA, CKD, OSA, BPH, and possible prostate cancer now w/ new onset left flank pain, found to have a 2.3 cm left lower pole renal mass (possible UTUC due to endophytic nature) w/ para-aortic adenopathy as well as multifocal sclerotic bone lesions.  - OR today for cysto, left ureteroscopy, possible ureteroscopic tumor biopsy and ablation, possible left ureteral stent placement    LOS: 1 day   Reola Mosher, MD [facility] Urology Resident, [facility]

## 2021-03-31 NOTE — Anesthesia Postprocedure Evaluation (Addendum)
Anesthesia Post Note  Patient: [name]  Procedure(s) Performed: CYSTOSCOPY WITH RETROGRADE PYELOGRAM, URETEROSCOPY, LEFT URETERAL mass biopsy, INSERTION LEFT URETERAL STENT (Left)     Patient location during evaluation: PACU Anesthesia Type: General Level of consciousness: awake and alert Pain management: pain level controlled Vital Signs Assessment: post-procedure vital signs reviewed and stable Respiratory status: spontaneous breathing, nonlabored ventilation and respiratory function stable Cardiovascular status: blood pressure returned to baseline and stable Postop Assessment: no apparent nausea or vomiting Anesthetic complications: no Comments: Patient confused and disoriented in PACU. Given Precedex IV.   No notable events documented.  Last Vitals:  Vitals:   03/31/21 1130 03/31/21 1145  BP: (!) 142/93 127/78  Pulse: 94 90  Resp: 16 13  Temp: (!) 36.3 C   SpO2: 94% 92%    Last Pain:  Vitals:   03/31/21 1130  TempSrc:   PainSc: 0-No pain                 Alden Feagan A.

## 2021-03-31 NOTE — Progress Notes (Signed)
Initial Nutrition Assessment  DOCUMENTATION CODES:   Obesity unspecified  INTERVENTION:  - diet advancement as medically feasible. - continue Ensure Plus BID, each supplement provides 350 kcal and 13 grams of protein. - complete NFPE when feasible.    NUTRITION DIAGNOSIS:   Increased nutrient needs related to acute illness as evidenced by estimated needs.  GOAL:   Patient will meet greater than or equal to 90% of their needs  MONITOR:   Diet advancement, PO intake, Labs, Weight trends  REASON FOR ASSESSMENT:   Malnutrition Screening Tool    ASSESSMENT:   80 y.o. male with medical history of stage 3 CKD, type 2 DM, HFpEF, HTN, AAA, BPH, OSA, and questionable history of prostate cancer (patient denies a history of cancer). He presented to the ED for evaluation of L flank pain. Patient reported having a yeast infection in groin area for a few weeks PTA. He reported urinary urgency with mild dysuria. UA indicated UTI and imaging suggested a neoplasm in L kidney and sclerotic lesions to multiple bones.  Patient is noted to be disoriented x4. His daughter was at bedside and provides all information. Patient is disgruntled so NFPE deferred at this time. Daughter reports that his current demeanor is not usual for him. Patient returned back to room not long prior to visit from OR for L kidney biopsy and L ureteral stent placement.  Daughter reports that patient has had a very good appetite PTA with no recent changes in eating patterns or amount of food consumed/day. She denies him having chewing or swallowing difficulties/discomforts with any foods or beverages. She denies him having any issues with ambulation PTA.   Ensure Plus ordered BID yesterday and patient has accepted all bottles of the supplement offered to him so far.   He has not been seen by a [place] RD at any time in the past.  Weight yesterday was 196 lb and weight on 11/19/20 was 223 lb. This indicates 27 lb weight  loss (12% body weight) in 3 months; significant for time frame.    Labs reviewed; CBG: 101 mg/dl, BUN: 69 mg/dl, creatinine: 2.5 mg/dl, GFR: 26 ml/min. Medications reviewed; sliding scale novolog, 16 units semglee/day, 40 mg oral protonix/day. IVF; LR @ 75 ml/hr.     NUTRITION - FOCUSED PHYSICAL EXAM:  Unable to complete at this time.   Diet Order:   Diet Order             Diet NPO time specified  Diet effective midnight                   EDUCATION NEEDS:   No education needs have been identified at this time  Skin:  Skin Assessment: Reviewed RN Assessment  Last BM:  9/28 (type 6)  Height:   Ht Readings from Last 1 Encounters:  03/30/21 5\' 5"  (1.651 m)    Weight:   Wt Readings from Last 1 Encounters:  03/30/21 88.8 kg     Estimated Nutritional Needs:  Kcal:  1800-2000 kcal Protein:  90-105 grams Fluid:  >/= 2 L/day      Jarome Matin, MS, RD, LDN, CNSC Inpatient Clinical Dietitian RD pager # available in AMION  After hours/weekend pager # available in [facility]

## 2021-03-31 NOTE — Interval H&P Note (Signed)
History and Physical Interval Note:  03/31/2021 9:04 AM  [name]  has presented today for surgery, with the diagnosis of left renal mass.  The various methods of treatment have been discussed with the patient and family. After consideration of risks, benefits and other options for treatment, the patient has consented to  Procedure(s): CYSTOSCOPY WITH RETROGRADE PYELOGRAM, URETEROSCOPY mass biopsy (Left) as a surgical intervention.  The patient's history has been reviewed, patient examined, no change in status, stable for surgery.  I have reviewed the patient's chart and labs.  Questions were answered to the patient's satisfaction.     Ardis Hughs

## 2021-03-31 NOTE — Addendum Note (Signed)
Addendum  created 03/31/21 1202 by Josephine Igo, MD   Clinical Note Signed

## 2021-03-31 NOTE — Progress Notes (Signed)
Chaplain provided listening support to [name] as he shared about his surgery tomorrow. He is grateful that the surgeon thought he could help him.  He is concerned that they might find cancer when they do the surgery.  Chaplain provided prayer, at patient's request.  Kathrynn Humble, [place] Pager, [phone]

## 2021-03-31 NOTE — Progress Notes (Signed)
[facility] PROGRESS NOTE   [name] XYI:[id number] DOB: 20-May-1941 DOA: 03/29/2021  PCP: [name], MD  Brief History/Interval Summary: 80 y.o. male with medical history significant for CKD 3A, DMT2, HFpEF, HTN, AAA, hx of BPH and questionable history of prostate cancer (patient denies a history of cancer), OSA who presented for evalaution of left flank pain.  He reports he has had a yeast infection in his groin for the last few weeks and he has been on Diflucan at home.  His last dose was yesterday.  He reports has been having some urinary urgency with mild dysuria.  He reports he had kidney stones in 1969 but none since then.  Noted to have UTI on UA.  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  Consultants: Urology  Procedures:  Cystoscopy, left retrograde pyelogram with interpretation, left ureteral stent placement Left ureteroscopy and biopsy of the renal pelvic urothelium/mass   Subjective/Interval History: Patient seen after he came back to the room from the OR.  Noted to be confused at this time.  Likely due to effects of anesthesia.  His family is at the bedside.   Assessment/Plan:  Acute kidney injury superimposed on chronic kidney disease stage IIIa/hyponatremia It appears that his baseline creatinine is around 2.0.  Presented with BUN of 84 and creatinine of 3.45.  There was thought to be an element of hypovolemia.  Patient also had hyponatremia.  He was started on IV fluids.   Renal function slowly improving.  Creatinine is down to 2.5.  Continue IV fluids for another 24 hours.  Monitor urine output.  Avoid nephrotoxic agents.    Acute metabolic encephalopathy Likely due to effects of anesthesia.  No focal neurological deficits noted.  Continue to monitor.  Urinary tract infection UA noted to be abnormal.  Currently on ciprofloxacin.  He has allergies to  cephalosporins.  Urine cultures with multiple species.  We will plan for a 5-day course.  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  Urology has been consulted.  Patient underwent biopsy today.  Left ureteral stent was also placed.  Foley catheter was inserted.  Sclerotic osseous lesions When asked about his history of prostate cancer patient absolutely denies any history of prostate cancer.  His PSA is noted to be 0.19.  Previously was being cared for by urologist, [name], in [facility].  These lesions will likely need further work-up.  Will depend on the pathology of his kidney lesion.  But these issues can be pursued in the outpatient setting.  Nephrolithiasis Noted on CT scan but they are nonobstructing.  Currently asymptomatic.  Further management per urology.  History of COPD Stable.  Home medications being continued.  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  SSI.  Also on glargine.  HbA1c was 5.9 in July.  Obstructive sleep apnea CPAP  Essential hypertension Noted to be on bisoprolol which will be continued.  Hyperlipidemia Continue statin.  Obesity Estimated body mass index is 32.57 kg/m as calculated from the following:   Height as of this encounter: 5\' 5"  (1.651 m).   Weight as of this encounter: 88.8 kg.    DVT Prophylaxis: Subcutaneous heparin Code Status: Full code Family Communication: Discussed with patient wife and 2 daughters. Disposition Plan: Hopefully return home when improved.  Mobilize.  Status is: Inpatient  Remains inpatient appropriate because:Ongoing diagnostic testing needed not appropriate for outpatient work up, IV treatments appropriate due to intensity of illness or inability to take PO, and Inpatient level of care appropriate due to severity of illness  Dispo: The patient is from: Home              Anticipated d/c is to: Home               Patient currently is not medically stable to d/c.   Difficult to place patient No     Medications: Scheduled:  aspirin EC  81 mg Oral Daily   atorvastatin  80 mg Oral QAC breakfast   bisoprolol  2.5 mg Oral Daily   Chlorhexidine Gluconate Cloth  6 each Topical Daily   feeding supplement  237 mL Oral BID BM   fentaNYL       fluticasone furoate-vilanterol  1 puff Inhalation Daily   And   umeclidinium bromide  1 puff Inhalation Daily   heparin  5,000 Units Subcutaneous Q8H   insulin aspart  0-5 Units Subcutaneous QHS   insulin aspart  0-9 Units Subcutaneous TID WC   insulin glargine-yfgn  16 Units Subcutaneous Daily   ondansetron       pantoprazole  40 mg Oral QHS   Continuous:  ciprofloxacin 0 mg (03/30/21 2212)   lactated ringers 75 mL/hr at 03/31/21 0901   [id number] **OR** acetaminophen, albuterol, HYDROmorphone (DILAUDID) injection, ondansetron **OR** ondansetron (ZOFRAN) IV, senna-docusate  Antibiotics: Anti-infectives (From admission, onward)    Start     Dose/Rate Route Frequency Ordered Stop   03/31/21 0705  ciprofloxacin (CIPRO) 400 MG/200ML IVPB       Note to Pharmacy: Georgena Spurling   : cabinet override      03/31/21 0705 03/31/21 0930   03/30/21 2200  ciprofloxacin (CIPRO) IVPB 200 mg        200 mg 100 mL/hr over 60 Minutes Intravenous Every 12 hours 03/30/21 0524     03/30/21 0015  ciprofloxacin (CIPRO) IVPB 400 mg        400 mg 200 mL/hr over 60 Minutes Intravenous  Once 03/30/21 0005 03/30/21 0146       Objective:  Vital Signs  Vitals:   03/31/21 1115 03/31/21 1130 03/31/21 1145 03/31/21 1200  BP: (!) 152/87 (!) 142/93 127/78 (!) 141/90  Pulse: 99 94 90 92  Resp: (!) 29 16 13 18   Temp:  (!) 97.4 F (36.3 C)  97.6 F (36.4 C)  TempSrc:      SpO2: 98% 94% 92% 93%  Weight:      Height:        Intake/Output Summary (Last 24 hours) at 03/31/2021 1238 Last data filed at 03/31/2021 1200 Gross per 24 hour  Intake 2785.83 ml  Output --   Net 2785.83 ml    Filed Weights   03/29/21 1753 03/29/21 2120 03/30/21 0409  Weight: 90.7 kg 89.2 kg 88.8 kg    General appearance: Seen after he came back from the OR.  Noted to be confused. Resp: Clear to auscultation bilaterally.  Normal effort Cardio: S1-S2 is normal regular.  No S3-S4.  No rubs murmurs or bruit GI: Abdomen is soft.  Nontender nondistended.  Bowel sounds are present normal.  No masses organomegaly Extremities: No edema.   Neurologic:   No focal neurological deficits.     Lab Results:  Data Reviewed: I have personally reviewed following labs and imaging studies  CBC: Recent  Labs  Lab 03/29/21 1811 03/30/21 0711 03/31/21 0332  WBC 5.5 5.6 4.8  HGB 13.6 13.3 12.8*  HCT 44.5 43.5 41.9  MCV 89.4 89.5 89.9  PLT 177 153 154     Basic Metabolic Panel: Recent Labs  Lab 03/29/21 1811 03/30/21 0711 03/31/21 0332  NA 133* 134* 135  K 4.5 4.9 4.5  CL 97* 101 105  CO2 25 23 21*  GLUCOSE 162* 150* 127*  BUN 84* 86* 69*  CREATININE 3.45* 2.95* 2.50*  CALCIUM 10.3 10.1 9.8  PHOS 4.3  --   --      GFR: Estimated Creatinine Clearance: 24.9 mL/min (A) (by C-G formula based on SCr of 2.5 mg/dL (H)).  Liver Function Tests: Recent Labs  Lab 03/29/21 1811 03/30/21 0711  AST 16 20  ALT 16 19  ALKPHOS 131* 150*  BILITOT 0.7 0.8  PROT 7.7 7.3  ALBUMIN 4.1 3.5      CBG: Recent Labs  Lab 03/30/21 0754 03/30/21 1205 03/30/21 1610 03/30/21 1958 03/31/21 0707  GLUCAP 150* 164* 123* 138* 101*      Recent Results (from the past 240 hour(s))  Urine Culture     Status: Abnormal   Collection Time: 03/29/21  6:11 PM   Specimen: Urine, Clean Catch  Result Value Ref Range Status   Specimen Description   Final    URINE, CLEAN CATCH Performed at [facility], [address] [place], [place] [address]    Special Requests   Final    NONE Performed at [facility], [address] [place], [place]  [id number]    Culture MULTIPLE SPECIES PRESENT, SUGGEST RECOLLECTION (A)  Final   Report Status 03/31/2021 FINAL  Final  Resp Panel by RT-PCR (Flu A&B, Covid) Nasopharyngeal Swab     Status: None   Collection Time: 03/30/21 12:30 AM   Specimen: Nasopharyngeal Swab; Nasopharyngeal(NP) swabs in vial transport medium  Result Value Ref Range Status   SARS Coronavirus 2 by RT PCR NEGATIVE NEGATIVE Final    Comment: (NOTE) SARS-CoV-2 target nucleic acids are NOT DETECTED.  The SARS-CoV-2 RNA is generally detectable in upper respiratory specimens during the acute phase of infection. The lowest concentration of SARS-CoV-2 viral copies this assay can detect is 138 copies/mL. A negative result does not preclude SARS-Cov-2 infection and should not be used as the sole basis for treatment or other patient management decisions. A negative result may occur with  improper specimen collection/handling, submission of specimen other than nasopharyngeal swab, presence of viral mutation(s) within the areas targeted by this assay, and inadequate number of viral copies(<138 copies/mL). A negative result must be combined with clinical observations, patient history, and epidemiological information. The expected result is Negative.  Fact Sheet for Patients:  EntrepreneurPulse.com.au  Fact Sheet for Healthcare Providers:  IncredibleEmployment.be  This test is no t yet approved or cleared by the [place] FDA and  has been authorized for detection and/or diagnosis of SARS-CoV-2 by FDA under an Emergency Use Authorization (EUA). This EUA will remain  in effect (meaning this test can be used) for the duration of the COVID-19 declaration under Section 564(b)(1) of the Act, 21 U.S.C.section 360bbb-3(b)(1), unless the authorization is terminated  or revoked sooner.       Influenza A by PCR NEGATIVE NEGATIVE Final   Influenza B by PCR NEGATIVE NEGATIVE Final    Comment:  (NOTE) The Xpert Xpress SARS-CoV-2/FLU/RSV plus assay is intended as an aid in the diagnosis of influenza from Nasopharyngeal  swab specimens and should not be used as a sole basis for treatment. Nasal washings and aspirates are unacceptable for Xpert Xpress SARS-CoV-2/FLU/RSV testing.  Fact Sheet for Patients: EntrepreneurPulse.com.au  Fact Sheet for Healthcare Providers: IncredibleEmployment.be  This test is not yet approved or cleared by the [place] FDA and has been authorized for detection and/or diagnosis of SARS-CoV-2 by FDA under an Emergency Use Authorization (EUA). This EUA will remain in effect (meaning this test can be used) for the duration of the COVID-19 declaration under Section 564(b)(1) of the Act, 21 U.S.C. section 360bbb-3(b)(1), unless the authorization is terminated or revoked.  Performed at [facility], [address], [place], [place] [address]        Radiology Studies: DG C-Arm 1-60 Min-No Report  Result Date: 03/31/2021 Fluoroscopy was utilized by the requesting physician.  No radiographic interpretation.   CT Renal Stone Study  Result Date: 03/29/2021 CLINICAL DATA:  Intermittent left flank pain x2 weeks EXAM: CT ABDOMEN AND PELVIS WITHOUT CONTRAST TECHNIQUE: Multidetector CT imaging of the abdomen and pelvis was performed following the standard protocol without IV contrast. COMPARISON:  08/11/2014 FINDINGS: Lower chest: Lung bases are clear. Hepatobiliary: Unenhanced liver is unremarkable. Gallbladder is unremarkable. No intrahepatic or extrahepatic duct dilatation. Pancreas: Within normal limits. Spleen: Within normal limits. Adrenals/Urinary Tract: Mild low-density thickening of the left adrenal gland (series 2/image 25), likely reflecting a benign adrenal adenoma. Right adrenal gland is within normal limits. Bilateral nonobstructing renal calculi measuring 3 mm right lower pole and 2 mm in  the left lower pole (series 2/image 41). No hydronephrosis. Bilateral renal cysts, measuring up to 2.0 cm in the lateral left upper kidney (series 2/image 32). 12 mm hemorrhagic cyst in the anterior left lower kidney (series 2/image 40). 2.3 cm endophytic polypoid lesion in the left lower pole (series 2/image 39), poorly evaluated on unenhanced CT, but neoplasm including collecting system TCC is not excluded. Bladder is within normal limits. Stomach/Bowel: Stomach is notable for a tiny hiatal hernia. No evidence of bowel obstruction. Normal appendix (series 2/image 55). No colonic wall thickening or inflammatory changes. 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 Atherosclerotic calcifications of the abdominal aorta and branch vessels. Small para-aortic lymph nodes, including a 10 mm short axis left para-aortic node (series 2/image 38), new. Reproductive: Prostate is diminutive. Other: No abdominopelvic ascites. Small fat containing left paramidline infraumbilical ventral hernia containing a knuckle of sigmoid colon (series 2/image 64). Tiny fat containing periumbilical hernia. Mild laxity of the midline supraumbilical anterior abdominal wall (series 2/image 52). 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 IMPRESSION: 2.3 cm endophytic polypoid lesion in the left lower kidney, poorly evaluated on unenhanced CT, neoplasm such as collecting system TCC not excluded. Consider CT abdomen with/without contrast for further evaluation. Bilateral nonobstructing lower pole renal calculi measuring 2-3 mm. No hydronephrosis. Small para-nodes measuring up to 10 mm short axis, new. Attention on follow-up is suggested. 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 Electronically Signed    By: Julian Hy M.D.   On: 03/29/2021 23:28       LOS: 1 day   [facility] Pager on [email or web address]  03/31/2021, 12:38 PM

## 2021-03-31 NOTE — Progress Notes (Signed)
Urology Progress Note   80 y.o. male with Hx of T2DM, HFpEF, HTN, AAA, CKD, OSA, BPH, and possible prostate cancer now w/ new onset left flank pain, found to have a 2.3 cm left lower pole renal mass (possible UTUC due to endophytic nature) w/ para-aortic adenopathy as well as multifocal sclerotic bone lesions.  Subjective: NAEON.  AF, VSS Pain controlled. No nausea Has been NPO since MN Slowly down trending Cr (2.50 today)  Objective: Vital signs in last 24 hours: Temp:  [97.7 F (36.5 C)-98 F (36.7 C)] 97.8 F (36.6 C) (09/29 0500) Pulse Rate:  [90-94] 93 (09/29 0500) Resp:  [17-19] 17 (09/29 0500) BP: (102-121)/(77-83) 121/83 (09/29 0500) SpO2:  [82 %-96 %] 95 % (09/29 0500)  Intake/Output from previous day: 09/28 0701 - 09/29 0700 In: 1552.7 [I.V.:1452.8; IV Piggyback:99.9] Out: 200 [Urine:200] Intake/Output this shift: Total I/O In: 108.7 [I.V.:108.7] Out: -   Physical Exam:  General: Alert and oriented CV: Regular rate Lungs: No increased work of breathing Abdomen: Soft, non-tender. GU: Left CVA tenderness Ext: NT, No erythema  Lab Results: Recent Labs    03/29/21 1811 03/30/21 0711 03/31/21 0332  HGB 13.6 13.3 12.8*  HCT 44.5 43.5 41.9   Recent Labs    03/30/21 0711 03/31/21 0332  NA 134* 135  K 4.9 4.5  CL 101 105  CO2 23 21*  GLUCOSE 150* 127*  BUN 86* 69*  CREATININE 2.95* 2.50*  CALCIUM 10.1 9.8    Studies/Results: CT Renal Stone Study  Result Date: 03/29/2021 CLINICAL DATA:  Intermittent left flank pain x2 weeks EXAM: CT ABDOMEN AND PELVIS WITHOUT CONTRAST TECHNIQUE: Multidetector CT imaging of the abdomen and pelvis was performed following the standard protocol without IV contrast. COMPARISON:  08/11/2014 FINDINGS: Lower chest: Lung bases are clear. Hepatobiliary: Unenhanced liver is unremarkable. Gallbladder is unremarkable. No intrahepatic or extrahepatic duct dilatation. Pancreas: Within normal limits. Spleen: Within normal limits.  Adrenals/Urinary Tract: Mild low-density thickening of the left adrenal gland (series 2/image 25), likely reflecting a benign adrenal adenoma. Right adrenal gland is within normal limits. Bilateral nonobstructing renal calculi measuring 3 mm right lower pole and 2 mm in the left lower pole (series 2/image 41). No hydronephrosis. Bilateral renal cysts, measuring up to 2.0 cm in the lateral left upper kidney (series 2/image 32). 12 mm hemorrhagic cyst in the anterior left lower kidney (series 2/image 40). 2.3 cm endophytic polypoid lesion in the left lower pole (series 2/image 39), poorly evaluated on unenhanced CT, but neoplasm including collecting system TCC is not excluded. Bladder is within normal limits. Stomach/Bowel: Stomach is notable for a tiny hiatal hernia. No evidence of bowel obstruction. Normal appendix (series 2/image 55). No colonic wall thickening or inflammatory changes. 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 Atherosclerotic calcifications of the abdominal aorta and branch vessels. Small para-aortic lymph nodes, including a 10 mm short axis left para-aortic node (series 2/image 38), new. Reproductive: Prostate is diminutive. Other: No abdominopelvic ascites. Small fat containing left paramidline infraumbilical ventral hernia containing a knuckle of sigmoid colon (series 2/image 64). Tiny fat containing periumbilical hernia. Mild laxity of the midline supraumbilical anterior abdominal wall (series 2/image 52). 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 IMPRESSION: 2.3 cm endophytic polypoid lesion in the left lower kidney, poorly evaluated on unenhanced CT, neoplasm such as collecting system TCC not excluded. Consider CT abdomen with/without  contrast for further evaluation. Bilateral nonobstructing lower pole  renal calculi measuring 2-3 mm. No hydronephrosis. Small para-nodes measuring up to 10 mm short axis, new. Attention on follow-up is suggested. 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 Electronically Signed   By: Julian Hy M.D.   On: 03/29/2021 23:28    Assessment/Plan:  80 y.o. male with Hx of T2DM, HFpEF, HTN, AAA, CKD, OSA, BPH, and possible prostate cancer now w/ new onset left flank pain, found to have a 2.3 cm left lower pole renal mass (possible UTUC due to endophytic nature) w/ para-aortic adenopathy as well as multifocal sclerotic bone lesions.  - OR today for cysto, left ureteroscopy, possible ureteroscopic tumor biopsy and ablation, possible left ureteral stent placement    LOS: 1 day   Reola Mosher, MD [facility] Urology Resident, [facility]

## 2021-03-31 NOTE — Progress Notes (Signed)
I did not identify an obvious renal mass or a mass consistent with transitional cell carcinoma.  There was some smaller areas within the upper pole of the kidney that we did biopsy and sent cytology 4.  Hopefully this will be revealing.  We placed a large stent because of the bleeding from the left renal pelvis.  We also placed a three-way Foley catheter but capped it in the event that he needed to start CBI.

## 2021-03-31 NOTE — Transfer of Care (Signed)
Immediate Anesthesia Transfer of Care Note  Patient: Rodman Key  Procedure(s) Performed: CYSTOSCOPY WITH RETROGRADE PYELOGRAM, URETEROSCOPY, LEFT URETERAL mass biopsy, INSERTION LEFT URETERAL STENT (Left)  Patient Location: PACU  Anesthesia Type:General  Level of Consciousness: awake and drowsy  Airway & Oxygen Therapy: Patient Spontanous Breathing and Patient connected to face mask oxygen  Post-op Assessment: Report given to RN and Post -op Vital signs reviewed and stable  Post vital signs: Reviewed and stable  Last Vitals:  Vitals Value Taken Time  BP 165/95 03/31/21 1100  Temp    Pulse 82 03/31/21 1104  Resp 37 03/31/21 1104  SpO2 87 % 03/31/21 1104  Vitals shown include unvalidated device data.  Last Pain:  Vitals:   03/31/21 0500  TempSrc: Oral  PainSc:          Complications: No notable events documented.

## 2021-03-31 NOTE — Anesthesia Procedure Notes (Signed)
Procedure Name: LMA Insertion Date/Time: 03/31/2021 9:11 AM Performed by: [place] Tanyika Barros C, CRNA Pre-anesthesia Checklist: Patient identified, Emergency Drugs available, Suction available and Patient being monitored Patient Re-evaluated:Patient Re-evaluated prior to induction Oxygen Delivery Method: Circle system utilized Preoxygenation: Pre-oxygenation with 100% oxygen Induction Type: IV induction Ventilation: Mask ventilation without difficulty LMA: LMA inserted LMA Size: 4.0 Number of attempts: 1 Airway Equipment and Method: Bite block Placement Confirmation: positive ETCO2 Tube secured with: Tape Dental Injury: Teeth and Oropharynx as per pre-operative assessment

## 2021-04-01 ENCOUNTER — Encounter (HOSPITAL_COMMUNITY): Payer: Self-pay | Admitting: Urology

## 2021-04-01 ENCOUNTER — Inpatient Hospital Stay (HOSPITAL_COMMUNITY): Payer: Medicare Other

## 2021-04-01 DIAGNOSIS — M79672 Pain in left foot: Secondary | ICD-10-CM

## 2021-04-01 DIAGNOSIS — D49519 Neoplasm of unspecified behavior of unspecified kidney: Secondary | ICD-10-CM | POA: Diagnosis not present

## 2021-04-01 DIAGNOSIS — N179 Acute kidney failure, unspecified: Secondary | ICD-10-CM | POA: Diagnosis not present

## 2021-04-01 DIAGNOSIS — N390 Urinary tract infection, site not specified: Secondary | ICD-10-CM | POA: Diagnosis not present

## 2021-04-01 LAB — CBC
HCT: 43.4 % (ref 39.0–52.0)
Hemoglobin: 13 g/dL (ref 13.0–17.0)
MCH: 27.5 pg (ref 26.0–34.0)
MCHC: 30 g/dL (ref 30.0–36.0)
MCV: 91.8 fL (ref 80.0–100.0)
Platelets: 149 10*3/uL — ABNORMAL LOW (ref 150–400)
RBC: 4.73 MIL/uL (ref 4.22–5.81)
RDW: 18.9 % — ABNORMAL HIGH (ref 11.5–15.5)
WBC: 6.6 10*3/uL (ref 4.0–10.5)
nRBC: 0 % (ref 0.0–0.2)

## 2021-04-01 LAB — BASIC METABOLIC PANEL
Anion gap: 7 (ref 5–15)
BUN: 52 mg/dL — ABNORMAL HIGH (ref 8–23)
CO2: 24 mmol/L (ref 22–32)
Calcium: 9.5 mg/dL (ref 8.9–10.3)
Chloride: 104 mmol/L (ref 98–111)
Creatinine, Ser: 2.34 mg/dL — ABNORMAL HIGH (ref 0.61–1.24)
GFR, Estimated: 28 mL/min — ABNORMAL LOW (ref >60)
Glucose, Bld: 99 mg/dL (ref 70–99)
Potassium: 5.1 mmol/L (ref 3.5–5.1)
Sodium: 135 mmol/L (ref 135–145)

## 2021-04-01 LAB — URIC ACID: Uric Acid, Serum: 4.3 mg/dL (ref 3.7–8.6)

## 2021-04-01 LAB — GLUCOSE, CAPILLARY
Glucose-Capillary: 139 mg/dL — ABNORMAL HIGH (ref 70–99)
Glucose-Capillary: 202 mg/dL — ABNORMAL HIGH (ref 70–99)
Glucose-Capillary: 204 mg/dL — ABNORMAL HIGH (ref 70–99)
Glucose-Capillary: 215 mg/dL — ABNORMAL HIGH (ref 70–99)
Glucose-Capillary: 290 mg/dL — ABNORMAL HIGH (ref 70–99)

## 2021-04-01 LAB — SURGICAL PATHOLOGY

## 2021-04-01 MED ORDER — SODIUM CHLORIDE 0.9 % IV SOLN
100.0000 mg | Freq: Two times a day (BID) | INTRAVENOUS | Status: DC
  Administered 2021-04-01 – 2021-04-02 (×3): 100 mg via INTRAVENOUS
  Filled 2021-04-01 (×3): qty 100

## 2021-04-01 NOTE — Evaluation (Signed)
Physical Therapy Evaluation Patient Details Name: [name] MRN: [id number] DOB: [birth year] Today's Date: 04/01/2021  History of Present Illness  80 y.o. male with medical history significant for CKD 3A, DMT2, HFpEF, HTN, AAA, hx of BPH and questionable history of prostate cancer (patient denies a history of cancer), OSA who presented for evalaution of left flank pain.  Pt s/p Cystoscopy, left retrograde pyelogram with interpretation, left ureteral stent placement and Left ureteroscopy and biopsy of the renal pelvic urothelium/mass.  Clinical Impression  Pt admitted with above diagnosis. Pt currently with functional limitations due to the deficits listed below (see PT Problem List). Pt will benefit from skilled PT to increase their independence and safety with mobility to allow discharge to the venue listed below.  Pt assisted with ambulating in hallway and currently requiring min assist to mobilize.  Daughter present and feels pt should be able to return home upon d/c if moving a little more.  Pt encouraged to mobilize with staff during hospitalization.       Recommendations for follow up therapy are one component of a multi-disciplinary discharge planning process, led by the attending physician.  Recommendations may be updated based on patient status, additional functional criteria and insurance authorization.  Follow Up Recommendations Home health PT;Supervision for mobility/OOB    Equipment Recommendations  None recommended by PT    Recommendations for Other Services       Precautions / Restrictions Precautions Precautions: Fall      Mobility  Bed Mobility Overal bed mobility: Needs Assistance Bed Mobility: Supine to Sit;Sit to Supine     Supine to sit: Min assist Sit to supine: Min guard   General bed mobility comments: assist for trunk upright    Transfers Overall transfer level: Needs assistance Equipment used: Rolling walker (2 wheeled) Transfers: Sit to/from  Stand Sit to Stand: Min assist         General transfer comment: assist to rise and steady, cues for hand placement  Ambulation/Gait Ambulation/Gait assistance: Min guard;Min assist Gait Distance (Feet): 50 Feet Assistive device: Rolling walker (2 wheeled) Gait Pattern/deviations: Step-through pattern;Decreased stride length;Trunk flexed     General Gait Details: verbal cues for RW positioning, posture  Stairs            Wheelchair Mobility    Modified Rankin (Stroke Patients Only)       Balance Overall balance assessment: Mild deficits observed, not formally tested                                           Pertinent Vitals/Pain Pain Assessment: No/denies pain Pain Intervention(s): Repositioned;Monitored during session    Home Living Family/patient expects to be discharged to:: Private residence Living Arrangements: Spouse/significant other;Children   Type of Home: House Home Access: Stairs to enter Entrance Stairs-Rails: Right Entrance Stairs-Number of Steps: 2 Home Layout: One level Home Equipment: Environmental consultant - 2 wheels;Other (comment);Bedside commode (sister has w/c)      Prior Function Level of Independence: Independent               Hand Dominance        Extremity/Trunk Assessment        Lower Extremity Assessment Lower Extremity Assessment: Generalized weakness       Communication   Communication: HOH  Cognition Arousal/Alertness: Awake/alert Behavior During Therapy: WFL for tasks assessed/performed Overall Cognitive Status: Within Functional Limits for  tasks assessed                                        General Comments      Exercises     Assessment/Plan    PT Assessment Patient needs continued PT services  PT Problem List Decreased strength;Decreased mobility;Decreased activity tolerance;Decreased knowledge of use of DME;Decreased balance       PT Treatment Interventions Gait  training;Therapeutic exercise;Balance training;DME instruction;Functional mobility training;Therapeutic activities;Patient/family education;Stair training    PT Goals (Current goals can be found in the Care Plan section)  Acute Rehab PT Goals PT Goal Formulation: With patient/family Time For Goal Achievement: 04/15/21 Potential to Achieve Goals: Good    Frequency Min 3X/week   Barriers to discharge        Co-evaluation               AM-PAC PT "6 Clicks" Mobility  Outcome Measure Help needed turning from your back to your side while in a flat bed without using bedrails?: A Little Help needed moving from lying on your back to sitting on the side of a flat bed without using bedrails?: A Little Help needed moving to and from a bed to a chair (including a wheelchair)?: A Little Help needed standing up from a chair using your arms (e.g., wheelchair or bedside chair)?: A Little Help needed to walk in hospital room?: A Little Help needed climbing 3-5 steps with a railing? : A Little 6 Click Score: 18    End of Session Equipment Utilized During Treatment: Gait belt Activity Tolerance: Patient tolerated treatment well Patient left: in bed;with call bell/phone within reach;with bed alarm set;with family/visitor present;with nursing/sitter in room   PT Visit Diagnosis: Difficulty in walking, not elsewhere classified (R26.2)    Time: 4081-4481 PT Time Calculation (min) (ACUTE ONLY): 15 min   Charges:   PT Evaluation $PT Eval Low Complexity: 1 Low        Kati PT, DPT Acute Rehabilitation Services Pager: [phone] Office: [place] 04/01/2021, 11:15 AM

## 2021-04-01 NOTE — Discharge Instructions (Signed)
DISCHARGE INSTRUCTIONS FOR KIDNEY STONE/URETERAL STENT   MEDICATIONS:  1.  Resume all your other meds from home - except do not take any extra narcotic pain meds that you may have at home.   ACTIVITY:  1. No strenuous activity x 1week  2. No driving while on narcotic pain medications  3. Drink plenty of water  4. Continue to walk at home - you can still get blood clots when you are at home, so keep active, but don't over do it.  5. May return to work/school tomorrow or when you feel ready   BATHING:  1. You can shower and we recommend daily showers   SIGNS/SYMPTOMS TO CALL:  Please call us if you have a fever greater than 101.5, uncontrolled nausea/vomiting, uncontrolled pain, dizziness, unable to urinate, bloody urine, chest pain, shortness of breath, leg swelling, leg pain, redness around wound, drainage from wound, or any other concerns or questions.   You can reach [place] at [phone].   FOLLOW-UP:  1. Call [facility] for an appointment within the next 2 weeks.

## 2021-04-01 NOTE — Evaluation (Signed)
Occupational Therapy Evaluation Patient Details Name: [name] MRN: [id number] DOB: 04/12/1941 Today's Date: 04/01/2021   History of Present Illness 80 y.o. male with medical history significant for CKD 3A, DMT2, HFpEF, HTN, AAA, hx of BPH and questionable history of prostate cancer (patient denies a history of cancer), OSA who presented for evalaution of left flank pain.  Pt s/p Cystoscopy, left retrograde pyelogram with interpretation, left ureteral stent placement and Left ureteroscopy and biopsy of the renal pelvic urothelium/mass.   Clinical Impression   [name] is a 80 year old man who presents with decreased activity tolerance and impaired balance. Overall patient is min guard for ambulation and ADLs with RW. Expect patient to progress well in hospital and will not have OT needs at discharge. Patient has 24/7 assistance from family and all needed DME and a friendly home environment. Will follow acutely to improve ADLs to modified independence.     Recommendations for follow up therapy are one component of a multi-disciplinary discharge planning process, led by the attending physician.  Recommendations may be updated based on patient status, additional functional criteria and insurance authorization.   Follow Up Recommendations  No OT follow up    Equipment Recommendations  None recommended by OT    Recommendations for Other Services       Precautions / Restrictions Precautions Precautions: Fall Restrictions Weight Bearing Restrictions: No      Mobility Bed Mobility               General bed mobility comments: sitting on side of bed.    Transfers Overall transfer level: Needs assistance Equipment used: Rolling walker (2 wheeled) Transfers: Sit to/from Stand Sit to Stand: Min guard         General transfer comment: Min guard to stand from bed with RW, stand at sink to wash hands and then take steps backward to bed.    Balance Overall balance  assessment: Mild deficits observed, not formally tested                                         ADL either performed or assessed with clinical judgement   ADL Overall ADL's : Needs assistance/impaired Eating/Feeding: Independent   Grooming: Min guard;Standing Grooming Details (indicate cue type and reason): at sink Upper Body Bathing: Set up;Sitting   Lower Body Bathing: Sit to/from stand;Min guard;Set up   Upper Body Dressing : Set up;Sitting   Lower Body Dressing: Set up;Sit to/from stand Lower Body Dressing Details (indicate cue type and reason): using figure four method Toilet Transfer: Min guard;RW;Regular Toilet;Grab bars   Toileting- Water quality scientist and Hygiene: Min guard;Sit to/from stand       Functional mobility during ADLs: Min guard;Rolling walker       Vision Patient Visual Report: No change from baseline       Perception     Praxis      Pertinent Vitals/Pain Pain Assessment: Faces Faces Pain Scale: Hurts a little bit Pain Location: L ankle Pain Descriptors / Indicators: Grimacing Pain Intervention(s): Monitored during session     Hand Dominance Right   Extremity/Trunk Assessment Upper Extremity Assessment Upper Extremity Assessment: Overall WFL for tasks assessed (Functional ROM, 4+/5 strength)   Lower Extremity Assessment Lower Extremity Assessment: Defer to PT evaluation   Cervical / Trunk Assessment Cervical / Trunk Assessment: Normal   Communication Communication Communication: [facility]  Cognition Arousal/Alertness: Awake/alert Behavior During Therapy: WFL for tasks assessed/performed Overall Cognitive Status: Within Functional Limits for tasks assessed                                     General Comments       Exercises     Shoulder Instructions      Home Living Family/patient expects to be discharged to:: Private residence Living Arrangements: Spouse/significant other;Children Available  Help at Discharge: Available 24 hours/day;Family Type of Home: House Home Access: Stairs to enter [employer] of Steps: 2 Entrance Stairs-Rails: Right Home Layout: One level     Bathroom Shower/Tub: Occupational psychologist: Handicapped height     Home Equipment: Environmental consultant - 2 wheels;Other (comment);Bedside commode;Shower seat;Grab bars - tub/shower (sister has w/c)          Prior Functioning/Environment Level of Independence: Independent                 OT Problem List: Decreased activity tolerance;Impaired balance (sitting and/or standing)      OT Treatment/Interventions: Self-care/ADL training;DME and/or AE instruction;Therapeutic activities;Patient/family education;Balance training    OT Goals(Current goals can be found in the care plan section) Acute Rehab OT Goals Patient Stated Goal: go home OT Goal Formulation: With patient/family Time For Goal Achievement: 04/15/21 Potential to Achieve Goals: Good  OT Frequency: Min 1X/week   Barriers to D/C:            Co-evaluation              AM-PAC OT "6 Clicks" Daily Activity     Outcome Measure Help from another person eating meals?: None Help from another person taking care of personal grooming?: A Little Help from another person toileting, which includes using toliet, bedpan, or urinal?: A Little Help from another person bathing (including washing, rinsing, drying)?: A Little Help from another person to put on and taking off regular upper body clothing?: A Little Help from another person to put on and taking off regular lower body clothing?: A Little 6 Click Score: 19   End of Session Equipment Utilized During Treatment: Rolling walker Nurse Communication:  (okay to see)  Activity Tolerance: Patient tolerated treatment well Patient left: Other (comment);with family/visitor present (side of bed)  OT Visit Diagnosis: Unsteadiness on feet (R26.81)                Time: 1062-6948 OT  Time Calculation (min): 11 min Charges:  OT General Charges $OT Visit: 1 Visit OT Evaluation $OT Eval Low Complexity: 1 Low  [name], OTR/L [place]  Office [phone] Pager: [place] 04/01/2021, 3:11 PM

## 2021-04-01 NOTE — Progress Notes (Signed)
S/p left renal pelvis biopsy, cytology and stent placement on 03/31/21.  Confused immediately after surgery but this has cleared. Urine is blood tinged. Left flank pain is improving.   Intake/Output Summary (Last 24 hours) at 04/01/2021 1854 Last data filed at 04/01/2021 1700 Gross per 24 hour  Intake 683.29 ml  Output 1300 ml  Net -616.71 ml   Vitals:   04/01/21 0423 04/01/21 0821 04/01/21 1000 04/01/21 1609  BP: (!) 104/53  108/68 99/65  Pulse: (!) 104  (!) 121 (!) 108  Resp: 18   20  Temp: 98.6 F (37 C)  98.2 F (36.8 C) 97.6 F (36.4 C)  TempSrc: Oral  Oral Oral  SpO2: 94% 96% 91% 90%  Weight:      Height:       NAD Nonlabored breathign Abdomen soft Foley draining blood tinged urine. Ext symmetric  Recent Labs    03/30/21 0711 03/31/21 0332 04/01/21 0338  WBC 5.6 4.8 6.6  HGB 13.3 12.8* 13.0  HCT 43.5 41.9 43.4   Recent Labs    03/30/21 0711 03/31/21 0332 04/01/21 0338  NA 134* 135 135  K 4.9 4.5 5.1  CL 101 105 104  CO2 23 21* 24  GLUCOSE 150* 127* 99  BUN 86* 69* 52*  CREATININE 2.95* 2.50* 2.34*  CALCIUM 10.1 9.8 9.5   No results for input(s): LABPT, INR in the last 72 hours. No results for input(s): PSA in the last 72 hours. No results for input(s): LABURIN in the last 72 hours. Results for orders placed or performed during the hospital encounter of 03/29/21  Urine Culture     Status: Abnormal   Collection Time: 03/29/21  6:11 PM   Specimen: Urine, Clean Catch  Result Value Ref Range Status   Specimen Description   Final    URINE, CLEAN CATCH Performed at [facility], [address] [place], [place] [address]    Special Requests   Final    NONE Performed at [facility], [address] [place], [place] [address]    Culture MULTIPLE SPECIES PRESENT, SUGGEST RECOLLECTION (A)  Final   Report Status 03/31/2021 FINAL  Final  Resp Panel by RT-PCR (Flu A&B, Covid) Nasopharyngeal Swab     Status: None    Collection Time: 03/30/21 12:30 AM   Specimen: Nasopharyngeal Swab; Nasopharyngeal(NP) swabs in vial transport medium  Result Value Ref Range Status   SARS Coronavirus 2 by RT PCR NEGATIVE NEGATIVE Final    Comment: (NOTE) SARS-CoV-2 target nucleic acids are NOT DETECTED.  The SARS-CoV-2 RNA is generally detectable in upper respiratory specimens during the acute phase of infection. The lowest concentration of SARS-CoV-2 viral copies this assay can detect is 138 copies/mL. A negative result does not preclude SARS-Cov-2 infection and should not be used as the sole basis for treatment or other patient management decisions. A negative result may occur with  improper specimen collection/handling, submission of specimen other than nasopharyngeal swab, presence of viral mutation(s) within the areas targeted by this assay, and inadequate number of viral copies(<138 copies/mL). A negative result must be combined with clinical observations, patient history, and epidemiological information. The expected result is Negative.  Fact Sheet for Patients:  EntrepreneurPulse.com.au  Fact Sheet for Healthcare Providers:  IncredibleEmployment.be  This test is no t yet approved or cleared by the [place] FDA and  has been authorized for detection and/or diagnosis of SARS-CoV-2 by FDA under an Emergency Use Authorization (EUA). This EUA will remain  in  effect (meaning this test can be used) for the duration of the COVID-19 declaration under Section 564(b)(1) of the Act, 21 U.S.C.section 360bbb-3(b)(1), unless the authorization is terminated  or revoked sooner.       Influenza A by PCR NEGATIVE NEGATIVE Final   Influenza B by PCR NEGATIVE NEGATIVE Final    Comment: (NOTE) The Xpert Xpress SARS-CoV-2/FLU/RSV plus assay is intended as an aid in the diagnosis of influenza from Nasopharyngeal swab specimens and should not be used as a sole basis for treatment.  Nasal washings and aspirates are unacceptable for Xpert Xpress SARS-CoV-2/FLU/RSV testing.  Fact Sheet for Patients: EntrepreneurPulse.com.au  Fact Sheet for Healthcare Providers: IncredibleEmployment.be  This test is not yet approved or cleared by the [place] FDA and has been authorized for detection and/or diagnosis of SARS-CoV-2 by FDA under an Emergency Use Authorization (EUA). This EUA will remain in effect (meaning this test can be used) for the duration of the COVID-19 declaration under Section 564(b)(1) of the Act, 21 U.S.C. section 360bbb-3(b)(1), unless the authorization is terminated or revoked.  Performed at [facility], [address] [place], [place] [address]     Path: non-diagnostic  Imp: Unclear etiology of renal mass, LAD and sclerotic spinal lesions, renal pelvis biopsy was unremarkable, urine cytology is still pending.  Hematuria is clearing.  Rec: d/C foley in the morning.  I will f/u with patient regarding his cytology.  Maybe worth discussing findings with oncology and see if they have any additional recommendations for further work-up while in the hospital.  We will see patient as needed at this point, with plan to f/u as outpatient.

## 2021-04-01 NOTE — Progress Notes (Signed)
Pt 2000 vitals did not cross from dynamap to Epic. Unfortunately, vitals had already been cleared from machine for other pt use by the time this discrepancy was noticed. Vitals rechecked @ 2300. VSS. Will continue to monitor.

## 2021-04-01 NOTE — Progress Notes (Signed)
[facility] PROGRESS NOTE   [name] HBZ:[id number] DOB: May 03, 1941 DOA: 03/29/2021  PCP: [name], MD  Brief History/Interval Summary: 80 y.o. male with medical history significant for CKD 3A, DMT2, HFpEF, HTN, AAA, hx of BPH and questionable history of prostate cancer (patient denies a history of cancer), OSA who presented for evalaution of left flank pain.  He reports he has had a yeast infection in his groin for the last few weeks and he has been on Diflucan at home.  His last dose was yesterday.  He reports has been having some urinary urgency with mild dysuria.  He reports he had kidney stones in 1969 but none since then.  Noted to have UTI on UA.  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  Consultants: Urology  Procedures:  Cystoscopy, left retrograde pyelogram with interpretation, left ureteral stent placement Left ureteroscopy and biopsy of the renal pelvic urothelium/mass   Subjective/Interval History: Patient noted to be awake alert.  Overnight there was concern for redness and pain in his left foot.  Apparently has been ongoing for a month or so.  Has been seen by his PCP for same.  Etiology is unclear.  Denies any falls.  Not aware of any injuries.  Has not had any imaging studies.  Does have a history of gout for which he has been on allopurinol.    Assessment/Plan:  Acute kidney injury superimposed on chronic kidney disease stage IIIa/hyponatremia It appears that his baseline creatinine is around 2.0.  Presented with BUN of 84 and creatinine of 3.45.  There was thought to be an element of hypovolemia.  Patient also had hyponatremia.  He was started on IV fluids.   No function slowly improving.  Creatinine is down to 2.34.  Monitor urine output.  Three-way Foley catheter was placed after his urological procedure.  He is noted to have some hematuria currently. Avoid  nephrotoxic agents.  Acute metabolic encephalopathy Noted to be confused on 9/29 after his procedure.  Likely due to anesthesia.  Seems to be back to baseline now.    Urinary tract infection UA noted to be abnormal.  Currently on ciprofloxacin.  He has allergies to cephalosporins.  Urine cultures with multiple species.  We will plan for a 5-day course.  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  Urology was consulted.  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  Foley catheter was inserted subsequently.  Hematuria is noted.  Further management per urology.    Left foot pain and redness Apparently ongoing for a month or so.  He is tender over the dorsal aspect of the foot over the metatarsal region.  No imaging studies noted of the foot.  We will get a x-ray.  Findings could be suggestive of cellulitis.  He is currently on ciprofloxacin.  We will add doxycycline.  We will also check a uric acid level since gout is also in the differential.  Sclerotic osseous lesions When asked about his history of prostate cancer patient absolutely denies any history of prostate cancer.  His PSA is noted to be 0.19.  Previously was being cared for by urologist, [name], in [facility].  These lesions will likely need further work-up.  Will depend on the pathology of his kidney lesion.  But these issues  can be pursued in the outpatient setting.  Nephrolithiasis Noted on CT scan but they are nonobstructing.  Currently asymptomatic.  Further management per urology.  History of COPD Stable.  Home medications being continued.  Noted to be on 4 L of oxygen via nasal cannula.  This is since his urological procedure on 9/29.  Slowly try to wean him off.  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  SSI.  Also on glargine.  HbA1c was 5.9 in July.   CBGs are reasonably well controlled.  Obstructive sleep apnea CPAP  Essential hypertension Noted to be on bisoprolol which will be continued.  Blood pressure is reasonably well controlled.  Hyperlipidemia Continue statin.  Obesity Estimated body mass index is 32.57 kg/m as calculated from the following:   Height as of this encounter: 5\' 5"  (1.651 m).   Weight as of this encounter: 88.8 kg.    DVT Prophylaxis: We will hold his heparin due to hematuria.  Changed to SCDs. Code Status: Full code Family Communication: Discussed with patient and his daughter. Disposition Plan: Hopefully return home when improved.  Mobilize.  Status is: Inpatient  Remains inpatient appropriate because:Ongoing diagnostic testing needed not appropriate for outpatient work up, IV treatments appropriate due to intensity of illness or inability to take PO, and Inpatient level of care appropriate due to severity of illness  Dispo: The patient is from: Home              Anticipated d/c is to: Home              Patient currently is not medically stable to d/c.   Difficult to place patient No     Medications: Scheduled:  allopurinol  300 mg Oral Daily   aspirin EC  81 mg Oral Daily   atorvastatin  80 mg Oral QAC breakfast   bisoprolol  2.5 mg Oral Daily   Chlorhexidine Gluconate Cloth  6 each Topical Daily   feeding supplement  237 mL Oral BID BM   fluticasone furoate-vilanterol  1 puff Inhalation Daily   And   umeclidinium bromide  1 puff Inhalation Daily   heparin  5,000 Units Subcutaneous Q8H   insulin aspart  0-5 Units Subcutaneous QHS   insulin aspart  0-9 Units Subcutaneous TID WC   insulin glargine-yfgn  16 Units Subcutaneous Daily   pantoprazole  40 mg Oral QHS   Continuous:  ciprofloxacin Stopped (03/31/21 2216)   lactated ringers 75 mL/hr at 04/01/21 0131   IOE:[id number] **OR** acetaminophen, albuterol, haloperidol lactate, HYDROmorphone (DILAUDID) injection, ondansetron **OR**  ondansetron (ZOFRAN) IV, senna-docusate  Antibiotics: Anti-infectives (From admission, onward)    Start     Dose/Rate Route Frequency Ordered Stop   03/31/21 0705  ciprofloxacin (CIPRO) 400 MG/200ML IVPB       Note to Pharmacy: Georgena Spurling   : cabinet override      03/31/21 0705 03/31/21 0930   03/30/21 2200  ciprofloxacin (CIPRO) IVPB 200 mg        200 mg 100 mL/hr over 60 Minutes Intravenous Every 12 hours 03/30/21 0524     03/30/21 0015  ciprofloxacin (CIPRO) IVPB 400 mg        400 mg 200 mL/hr over 60 Minutes Intravenous  Once 03/30/21 0005 03/30/21 0146       Objective:  Vital Signs  Vitals:   03/31/21 1402 03/31/21 2300 04/01/21 0423 04/01/21 0821  BP: 123/70 97/67 (!) 104/53   Pulse:  86 95 (!) 104   Resp: 16 17 18    Temp: (!) 97.5 F (36.4 C) 98 F (36.7 C) 98.6 F (37 C)   TempSrc:  Oral Oral   SpO2:  94% 94% 96%  Weight:      Height:        Intake/Output Summary (Last 24 hours) at 04/01/2021 0915 Last data filed at 04/01/2021 0610 Gross per 24 hour  Intake 2183.29 ml  Output 1100 ml  Net 1083.29 ml    Filed Weights   03/29/21 1753 03/29/21 2120 03/30/21 0409  Weight: 90.7 kg 89.2 kg 88.8 kg    General appearance: Awake alert.  In no distress Resp: Clear to auscultation bilaterally.  Normal effort Cardio: S1-S2 is normal regular.  No S3-S4.  No rubs murmurs or bruit GI: Abdomen is soft.  Nontender nondistended.  Bowel sounds are present normal.  No masses organomegaly Extremities: Erythema noted over the dorsal aspect of the left foot.  Good pulses are present.  Warm to touch.  Tender to palpate.  Mild swelling is noted. Neurologic: More awake and oriented compared to yesterday.  No focal neurological deficits.      Lab Results:  Data Reviewed: I have personally reviewed following labs and imaging studies  CBC: Recent Labs  Lab 03/29/21 1811 03/30/21 0711 03/31/21 0332 04/01/21 0338  WBC 5.5 5.6 4.8 6.6  HGB 13.6 13.3 12.8* 13.0  HCT  44.5 43.5 41.9 43.4  MCV 89.4 89.5 89.9 91.8  PLT 177 153 154 149*     Basic Metabolic Panel: Recent Labs  Lab 03/29/21 1811 03/30/21 0711 03/31/21 0332 04/01/21 0338  NA 133* 134* 135 135  K 4.5 4.9 4.5 5.1  CL 97* 101 105 104  CO2 25 23 21* 24  GLUCOSE 162* 150* 127* 99  BUN 84* 86* 69* 52*  CREATININE 3.45* 2.95* 2.50* 2.34*  CALCIUM 10.3 10.1 9.8 9.5  PHOS 4.3  --   --   --      GFR: Estimated Creatinine Clearance: 26.6 mL/min (A) (by C-G formula based on SCr of 2.34 mg/dL (H)).  Liver Function Tests: Recent Labs  Lab 03/29/21 1811 03/30/21 0711  AST 16 20  ALT 16 19  ALKPHOS 131* 150*  BILITOT 0.7 0.8  PROT 7.7 7.3  ALBUMIN 4.1 3.5      CBG: Recent Labs  Lab 03/30/21 1958 03/31/21 0707 03/31/21 1717 03/31/21 2114 04/01/21 0741  GLUCAP 138* 101* 90 155* 139*      Recent Results (from the past 240 hour(s))  Urine Culture     Status: Abnormal   Collection Time: 03/29/21  6:11 PM   Specimen: Urine, Clean Catch  Result Value Ref Range Status   Specimen Description   Final    URINE, CLEAN CATCH Performed at [facility], [address] [place], [place] [address]    Special Requests   Final    NONE Performed at [facility], [address] [place], [place] [address]    Culture MULTIPLE SPECIES PRESENT, SUGGEST RECOLLECTION (A)  Final   Report Status 03/31/2021 FINAL  Final  Resp Panel by RT-PCR (Flu A&B, Covid) Nasopharyngeal Swab     Status: None   Collection Time: 03/30/21 12:30 AM   Specimen: Nasopharyngeal Swab; Nasopharyngeal(NP) swabs in vial transport medium  Result Value Ref Range Status   SARS Coronavirus 2 by RT PCR NEGATIVE NEGATIVE Final    Comment: (NOTE) SARS-CoV-2 target nucleic acids are NOT DETECTED.  The SARS-CoV-2  RNA is generally detectable in upper respiratory specimens during the acute phase of infection. The lowest concentration of SARS-CoV-2 viral copies this assay can  detect is 138 copies/mL. A negative result does not preclude SARS-Cov-2 infection and should not be used as the sole basis for treatment or other patient management decisions. A negative result may occur with  improper specimen collection/handling, submission of specimen other than nasopharyngeal swab, presence of viral mutation(s) within the areas targeted by this assay, and inadequate number of viral copies(<138 copies/mL). A negative result must be combined with clinical observations, patient history, and epidemiological information. The expected result is Negative.  Fact Sheet for Patients:  EntrepreneurPulse.com.au  Fact Sheet for Healthcare Providers:  IncredibleEmployment.be  This test is no t yet approved or cleared by the [place] FDA and  has been authorized for detection and/or diagnosis of SARS-CoV-2 by FDA under an Emergency Use Authorization (EUA). This EUA will remain  in effect (meaning this test can be used) for the duration of the COVID-19 declaration under Section 564(b)(1) of the Act, 21 U.S.C.section 360bbb-3(b)(1), unless the authorization is terminated  or revoked sooner.       Influenza A by PCR NEGATIVE NEGATIVE Final   Influenza B by PCR NEGATIVE NEGATIVE Final    Comment: (NOTE) The Xpert Xpress SARS-CoV-2/FLU/RSV plus assay is intended as an aid in the diagnosis of influenza from Nasopharyngeal swab specimens and should not be used as a sole basis for treatment. Nasal washings and aspirates are unacceptable for Xpert Xpress SARS-CoV-2/FLU/RSV testing.  Fact Sheet for Patients: EntrepreneurPulse.com.au  Fact Sheet for Healthcare Providers: IncredibleEmployment.be  This test is not yet approved or cleared by the [place] FDA and has been authorized for detection and/or diagnosis of SARS-CoV-2 by FDA under an Emergency Use Authorization (EUA). This EUA will remain in  effect (meaning this test can be used) for the duration of the COVID-19 declaration under Section 564(b)(1) of the Act, 21 U.S.C. section 360bbb-3(b)(1), unless the authorization is terminated or revoked.  Performed at [facility], [address] [place], [place] [address]        Radiology Studies: DG C-Arm 1-60 Min-No Report  Result Date: 03/31/2021 Fluoroscopy was utilized by the requesting physician.  No radiographic interpretation.       LOS: 2 days   [facility] Pager on [email or web address]  04/01/2021, 9:15 AM

## 2021-04-01 NOTE — Care Management Important Message (Signed)
Medicare IM printed for Social Work at [facility] to give to the patient 

## 2021-04-01 NOTE — Progress Notes (Signed)
Patient was unable to wear CPAP machine last night due to discomfort.

## 2021-04-02 DIAGNOSIS — M79672 Pain in left foot: Secondary | ICD-10-CM | POA: Diagnosis not present

## 2021-04-02 DIAGNOSIS — N179 Acute kidney failure, unspecified: Secondary | ICD-10-CM | POA: Diagnosis not present

## 2021-04-02 DIAGNOSIS — N390 Urinary tract infection, site not specified: Secondary | ICD-10-CM | POA: Diagnosis not present

## 2021-04-02 DIAGNOSIS — D49519 Neoplasm of unspecified behavior of unspecified kidney: Secondary | ICD-10-CM | POA: Diagnosis not present

## 2021-04-02 LAB — BASIC METABOLIC PANEL
Anion gap: 7 (ref 5–15)
BUN: 49 mg/dL — ABNORMAL HIGH (ref 8–23)
CO2: 23 mmol/L (ref 22–32)
Calcium: 8.7 mg/dL — ABNORMAL LOW (ref 8.9–10.3)
Chloride: 105 mmol/L (ref 98–111)
Creatinine, Ser: 2.47 mg/dL — ABNORMAL HIGH (ref 0.61–1.24)
GFR, Estimated: 26 mL/min — ABNORMAL LOW (ref >60)
Glucose, Bld: 160 mg/dL — ABNORMAL HIGH (ref 70–99)
Potassium: 4.8 mmol/L (ref 3.5–5.1)
Sodium: 135 mmol/L (ref 135–145)

## 2021-04-02 LAB — GLUCOSE, CAPILLARY
Glucose-Capillary: 136 mg/dL — ABNORMAL HIGH (ref 70–99)
Glucose-Capillary: 195 mg/dL — ABNORMAL HIGH (ref 70–99)
Glucose-Capillary: 184 mg/dL — ABNORMAL HIGH (ref 70–99)
Glucose-Capillary: 184 mg/dL — ABNORMAL HIGH (ref 70–99)

## 2021-04-02 LAB — CBC
HCT: 45 % (ref 39.0–52.0)
Hemoglobin: 13.5 g/dL (ref 13.0–17.0)
MCH: 27.6 pg (ref 26.0–34.0)
MCHC: 30 g/dL (ref 30.0–36.0)
MCV: 92 fL (ref 80.0–100.0)
Platelets: 126 10*3/uL — ABNORMAL LOW (ref 150–400)
RBC: 4.89 MIL/uL (ref 4.22–5.81)
RDW: 19.3 % — ABNORMAL HIGH (ref 11.5–15.5)
WBC: 7.8 10*3/uL (ref 4.0–10.5)
nRBC: 0 % (ref 0.0–0.2)

## 2021-04-02 MED ORDER — CIPROFLOXACIN HCL 250 MG PO TABS
250.0000 mg | ORAL_TABLET | Freq: Two times a day (BID) | ORAL | Status: DC
  Administered 2021-04-02 – 2021-04-03 (×3): 250 mg via ORAL
  Filled 2021-04-02 (×3): qty 1

## 2021-04-02 MED ORDER — DOXYCYCLINE HYCLATE 100 MG PO TABS
100.0000 mg | ORAL_TABLET | Freq: Two times a day (BID) | ORAL | Status: DC
  Administered 2021-04-02 – 2021-04-03 (×3): 100 mg via ORAL
  Filled 2021-04-02 (×3): qty 1

## 2021-04-02 NOTE — Progress Notes (Addendum)
Physical Therapy Treatment Patient Details Name: [name] MRN: [id number] DOB: 05/26/1941 Today's Date: 04/02/2021   History of Present Illness [name] is a 80 y.o. male presented for evalaution of left flank pain.  Pt s/p Cystoscopy, left retrograde pyelogram with interpretation, left ureteral stent placement and Left ureteroscopy and biopsy of the renal pelvic urothelium/mass9/29. Xray L foot: Degenerative changes L first MCP joint. PMH: CKD 3A, DMT2, HFpEF, HTN, AAA, COPD, hx of BPH and questionable history of prostate cancer (patient denies a history of cancer), OSA    PT Comments    Pt tolerates increased distance in ambulation, requires cues to position body closer to RW and for upright posture. Pt without LOB while ambulating. Upon return to room, pt needing restroom, cues for safety with IV line, able to perform STS from toilet with min guard using grab bar at side, performing peri care without supv for safety. Pt without complaints during session, denies pain, dizziness, SOB or other complaints. Pt tolerates remaining up in chair at [place] notified of session.    Recommendations for follow up therapy are one component of a multi-disciplinary discharge planning process, led by the attending physician.  Recommendations may be updated based on patient status, additional functional criteria and insurance authorization.  Follow Up Recommendations  Home health PT;Supervision for mobility/OOB     Equipment Recommendations  None recommended by PT    Recommendations for Other Services       Precautions / Restrictions Precautions Precautions: Fall Restrictions Weight Bearing Restrictions: No     Mobility  Bed Mobility  General bed mobility comments: in recliner upon arrival    Transfers Overall transfer level: Needs assistance Equipment used: Rolling walker (2 wheeled) Transfers: Sit to/from Stand Sit to Stand: Min guard  General transfer comment: BUE assisting  to power to stand from recliner and toilet, good steadiness upon rising  Ambulation/Gait Ambulation/Gait assistance: Min guard Gait Distance (Feet): 110 Feet Assistive device: Rolling walker (2 wheeled) Gait Pattern/deviations: Step-through pattern;Decreased stride length;Trunk flexed Gait velocity: decreased   General Gait Details: VC to position body closer to RW, upright posture, equal bil step length and foot clearance without LOB   Stairs             Wheelchair Mobility    Modified Rankin (Stroke Patients Only)       Balance Overall balance assessment: Mild deficits observed, not formally tested         Cognition Arousal/Alertness: Awake/alert Behavior During Therapy: WFL for tasks assessed/performed Overall Cognitive Status: Within Functional Limits for tasks assessed     Exercises      General Comments General comments (skin integrity, edema, etc.): on RA with SpO2 95% and HR 100-112 during session      Pertinent Vitals/Pain Pain Assessment: No/denies pain    Home Living                      Prior Function            PT Goals (current goals can now be found in the care plan section) Acute Rehab PT Goals Patient Stated Goal: go home PT Goal Formulation: With patient/family Time For Goal Achievement: 04/15/21 Potential to Achieve Goals: Good Progress towards PT goals: Progressing toward goals    Frequency    Min 3X/week      PT Plan Current plan remains appropriate    Co-evaluation  AM-PAC PT "6 Clicks" Mobility   Outcome Measure  Help needed turning from your back to your side while in a flat bed without using bedrails?: A Little Help needed moving from lying on your back to sitting on the side of a flat bed without using bedrails?: A Little Help needed moving to and from a bed to a chair (including a wheelchair)?: A Little Help needed standing up from a chair using your arms (e.g., wheelchair or bedside  chair)?: A Little Help needed to walk in hospital room?: A Little Help needed climbing 3-5 steps with a railing? : A Little 6 Click Score: 18    End of Session Equipment Utilized During Treatment: Gait belt Activity Tolerance: Patient tolerated treatment well Patient left: in chair;with call bell/phone within reach;with family/visitor present Nurse Communication: Mobility status (BM, void bladder, SpO2) PT Visit Diagnosis: Difficulty in walking, not elsewhere classified (R26.2)     Time: 1655-3748 PT Time Calculation (min) (ACUTE ONLY): 26 min  Charges:  $Gait Training: 8-22 mins $Therapeutic Activity: 8-22 mins                      Tori [name] PT, DPT 04/02/21, 10:17 AM

## 2021-04-02 NOTE — Progress Notes (Signed)
[facility] PROGRESS NOTE   [name] JJK:[id number] DOB: 07-11-1940 DOA: 03/29/2021  PCP: Vincent Panda, MD  Brief History/Interval Summary: 80 y.o. male with medical history significant for CKD 3A, DMT2, HFpEF, HTN, AAA, hx of BPH and questionable history of prostate cancer (patient denies a history of cancer), OSA who presented for evalaution of left flank pain.  He reports he has had a yeast infection in his groin for the last few weeks and he has been on Diflucan at home.  His last dose was yesterday.  He reports has been having some urinary urgency with mild dysuria.  He reports he had kidney stones in 1969 but none since then.  Noted to have UTI on UA.  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  Consultants: Urology  Procedures:  Cystoscopy, left retrograde pyelogram with interpretation, left ureteral stent placement Left ureteroscopy and biopsy of the renal pelvic urothelium/mass   Subjective/Interval History: Patient is awake alert.  Continues to have redness of his left foot.  Some redness in the right foot.  Has been ongoing for at least a month if not 2 months per patient and family.  Denies any pain at this time.  No other complaints offered.  Overall he feels better.  Assessment/Plan:  Acute kidney injury superimposed on chronic kidney disease stage IIIa/hyponatremia It appears that his baseline creatinine is around 2.0.  Presented with BUN of 84 and creatinine of 3.45.  There was thought to be an element of hypovolemia.  Patient also had hyponatremia.  He was started on IV fluids.   Renal function started improving.  Appears to have plateaued off now.  Creatinine noted to be 2.47.  BUN has improved.  Avoid nephrotoxic agents. Three-way Foley catheter was placed after his urological procedure.  To be discontinued this morning.  He was noted to have some hematuria.     Acute metabolic encephalopathy Noted to be confused on 9/29 after his procedure.  Likely due to anesthesia.  Back to baseline.  Urinary tract infection UA noted to be abnormal.  Currently on ciprofloxacin.  He has allergies to cephalosporins.  Urine cultures with multiple species.  We will plan for a 5-day course.  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  Urology was consulted.  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  Foley catheter was inserted subsequently.  Hematuria was present.  Pathology from the biopsy is negative for malignancy.  Urine cytology is still pending.   Foley catheter to be discontinued today.  Left foot pain and redness Apparently ongoing for a month or so.  He is tender over the dorsal aspect of the foot over the metatarsal region.  Imaging studies of the left foot does not show any injuries.  Uric acid is is 4.3.  WBC is normal.  He was started on doxycycline yesterday.  Wonder if this is erythromelalgia.  Has been ongoing for 1 to 2 months.  Discussed with patient and his daughter.  We will continue to treat for now but he will need to follow-up with his PCP for further management.  Could see how the foot does with cold exposure.  We will keep him off of his socks.  Sclerotic osseous lesions When asked about his history of prostate cancer patient absolutely denies any history of prostate  cancer.  His PSA is noted to be 0.19.  Previously was being cared for by urologist, [name], in [facility].   Discussed with [name] today with medical oncology.  She has reviewed his chart.  With normal PSA is unlikely that he has prostate cancer.  She thinks patient may need further work-up but this can be pursued in the outpatient setting including a PET scan.  Does not recommend pursuing biopsy at this time.  Will refer to medical oncology in the [place] area.     Nephrolithiasis Noted on CT scan but they are nonobstructing.  Currently asymptomatic.  Further management per urology.  History of COPD Stable.  Home medications being continued.  Noted to be on 4 L of oxygen via nasal cannula.  This is since his urological procedure on 9/29.  Continue to wean him down and hopefully get him off of oxygen.  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  SSI.  Also on glargine.  HbA1c was 5.9 in July.  CBGs are reasonably well controlled.  Obstructive sleep apnea CPAP  Essential hypertension Noted to be on bisoprolol which will be continued.  Blood pressure is reasonably well controlled.  Hyperlipidemia Continue statin.  Obesity Estimated body mass index is 32.57 kg/m as calculated from the following:   Height as of this encounter: 5\' 5"  (1.651 m).   Weight as of this encounter: 88.8 kg.    DVT Prophylaxis: Heparin on hold due to hematuria.  SCDs can be used.  Mobilize. Code Status: Full code Family Communication: Discussed with patient and his daughter. Disposition Plan: Hopefully return home when improved.  Mobilize.  Home health recommended by PT.  Status is: Inpatient  Remains inpatient appropriate because:Ongoing diagnostic testing needed not appropriate for outpatient work up, IV treatments appropriate due to intensity of illness or inability to take PO, and Inpatient level of care appropriate due to severity of illness  Dispo: The patient is from: Home              Anticipated d/c is to: Home              Patient currently is not medically stable to d/c.   Difficult to place patient No     Medications: Scheduled:  allopurinol  300 mg Oral Daily   aspirin EC  81 mg Oral Daily   atorvastatin  80 mg Oral QAC breakfast   bisoprolol  2.5 mg Oral Daily   Chlorhexidine Gluconate Cloth  6 each Topical Daily   feeding supplement  237 mL Oral BID BM   fluticasone  furoate-vilanterol  1 puff Inhalation Daily   And   umeclidinium bromide  1 puff Inhalation Daily   insulin aspart  0-5 Units Subcutaneous QHS   insulin aspart  0-9 Units Subcutaneous TID WC   insulin glargine-yfgn  16 Units Subcutaneous Daily   pantoprazole  40 mg Oral QHS   Continuous:  ciprofloxacin Stopped (04/01/21 2257)   doxycycline (VIBRAMYCIN) IV Stopped (04/02/21 0111)   lactated ringers 75 mL/hr at 04/02/21 0716   PRF:[id number] **OR** acetaminophen, albuterol, haloperidol lactate, HYDROmorphone (DILAUDID) injection, ondansetron **OR** ondansetron (ZOFRAN) IV, senna-docusate  Antibiotics: Anti-infectives (From admission, onward)    Start     Dose/Rate Route Frequency Ordered Stop   04/01/21 1100  doxycycline (VIBRAMYCIN) 100 mg in sodium chloride 0.9 % 250 mL IVPB        100 mg 125 mL/hr over 120 Minutes  Intravenous Every 12 hours 04/01/21 0924     03/31/21 0705  ciprofloxacin (CIPRO) 400 MG/200ML IVPB       Note to Pharmacy: Georgena Spurling   : cabinet override      03/31/21 0705 03/31/21 0930   03/30/21 2200  ciprofloxacin (CIPRO) IVPB 200 mg        200 mg 100 mL/hr over 60 Minutes Intravenous Every 12 hours 03/30/21 0524     03/30/21 0015  ciprofloxacin (CIPRO) IVPB 400 mg        400 mg 200 mL/hr over 60 Minutes Intravenous  Once 03/30/21 0005 03/30/21 0146       Objective:  Vital Signs  Vitals:   04/02/21 0221 04/02/21 0500 04/02/21 0741 04/02/21 0800  BP: 100/71 126/73  106/64  Pulse: (!) 102 (!) 105  (!) 103  Resp: 20 (!) 28    Temp: 98.5 F (36.9 C) 98.4 F (36.9 C)  98.1 F (36.7 C)  TempSrc: Oral Oral  Oral  SpO2: 90% 97% 97% 92%  Weight:      Height:        Intake/Output Summary (Last 24 hours) at 04/02/2021 1042 Last data filed at 04/02/2021 0830 Gross per 24 hour  Intake 2405.99 ml  Output 1850 ml  Net 555.99 ml    Filed Weights   03/29/21 1753 03/29/21 2120 03/30/21 0409  Weight: 90.7 kg 89.2 kg 88.8 kg    General appearance:  Awake alert.  In no distress Resp: Clear to auscultation bilaterally.  Normal effort Cardio: S1-S2 is normal regular.  No S3-S4.  No rubs murmurs or bruit GI: Abdomen is soft.  Nontender nondistended.  Bowel sounds are present normal.  No masses organomegaly Extremities: No change in redness of left foot.  Some redness noted in the right foot.  Good pulses. Neurologic: Alert and oriented x3.  No focal neurological deficits.       Lab Results:  Data Reviewed: I have personally reviewed following labs and imaging studies  CBC: Recent Labs  Lab 03/29/21 1811 03/30/21 0711 03/31/21 0332 04/01/21 0338 04/02/21 0353  WBC 5.5 5.6 4.8 6.6 7.8  HGB 13.6 13.3 12.8* 13.0 13.5  HCT 44.5 43.5 41.9 43.4 45.0  MCV 89.4 89.5 89.9 91.8 92.0  PLT 177 153 154 149* 126*     Basic Metabolic Panel: Recent Labs  Lab 03/29/21 1811 03/30/21 0711 03/31/21 0332 04/01/21 0338 04/02/21 0353  NA 133* 134* 135 135 135  K 4.5 4.9 4.5 5.1 4.8  CL 97* 101 105 104 105  CO2 25 23 21* 24 23  GLUCOSE 162* 150* 127* 99 160*  BUN 84* 86* 69* 52* 49*  CREATININE 3.45* 2.95* 2.50* 2.34* 2.47*  CALCIUM 10.3 10.1 9.8 9.5 8.7*  PHOS 4.3  --   --   --   --      GFR: Estimated Creatinine Clearance: 25.2 mL/min (A) (by C-G formula based on SCr of 2.47 mg/dL (H)).  Liver Function Tests: Recent Labs  Lab 03/29/21 1811 03/30/21 0711  AST 16 20  ALT 16 19  ALKPHOS 131* 150*  BILITOT 0.7 0.8  PROT 7.7 7.3  ALBUMIN 4.1 3.5      CBG: Recent Labs  Lab 04/01/21 1152 04/01/21 1719 04/01/21 2058 04/01/21 2344 04/02/21 0741  GLUCAP 290* 215* 202* 204* 136*      Recent Results (from the past 240 hour(s))  Urine Culture     Status: Abnormal   Collection Time: 03/29/21  6:11 PM  Specimen: Urine, Clean Catch  Result Value Ref Range Status   Specimen Description   Final    URINE, CLEAN CATCH Performed at [facility], [address] [place], [place] [address]     Special Requests   Final    NONE Performed at [facility], [address], [place], [place] [address]    Culture MULTIPLE SPECIES PRESENT, SUGGEST RECOLLECTION (A)  Final   Report Status 03/31/2021 FINAL  Final  Resp Panel by RT-PCR (Flu A&B, Covid) Nasopharyngeal Swab     Status: None   Collection Time: 03/30/21 12:30 AM   Specimen: Nasopharyngeal Swab; Nasopharyngeal(NP) swabs in vial transport medium  Result Value Ref Range Status   SARS Coronavirus 2 by RT PCR NEGATIVE NEGATIVE Final    Comment: (NOTE) SARS-CoV-2 target nucleic acids are NOT DETECTED.  The SARS-CoV-2 RNA is generally detectable in upper respiratory specimens during the acute phase of infection. The lowest concentration of SARS-CoV-2 viral copies this assay can detect is 138 copies/mL. A negative result does not preclude SARS-Cov-2 infection and should not be used as the sole basis for treatment or other patient management decisions. A negative result may occur with  improper specimen collection/handling, submission of specimen other than nasopharyngeal swab, presence of viral mutation(s) within the areas targeted by this assay, and inadequate number of viral copies(<138 copies/mL). A negative result must be combined with clinical observations, patient history, and epidemiological information. The expected result is Negative.  Fact Sheet for Patients:  EntrepreneurPulse.com.au  Fact Sheet for Healthcare Providers:  IncredibleEmployment.be  This test is no t yet approved or cleared by the [place] FDA and  has been authorized for detection and/or diagnosis of SARS-CoV-2 by FDA under an Emergency Use Authorization (EUA). This EUA will remain  in effect (meaning this test can be used) for the duration of the COVID-19 declaration under Section 564(b)(1) of the Act, 21 U.S.C.section 360bbb-3(b)(1), unless the authorization is terminated  or revoked  sooner.       Influenza A by PCR NEGATIVE NEGATIVE Final   Influenza B by PCR NEGATIVE NEGATIVE Final    Comment: (NOTE) The Xpert Xpress SARS-CoV-2/FLU/RSV plus assay is intended as an aid in the diagnosis of influenza from Nasopharyngeal swab specimens and should not be used as a sole basis for treatment. Nasal washings and aspirates are unacceptable for Xpert Xpress SARS-CoV-2/FLU/RSV testing.  Fact Sheet for Patients: EntrepreneurPulse.com.au  Fact Sheet for Healthcare Providers: IncredibleEmployment.be  This test is not yet approved or cleared by the [place] FDA and has been authorized for detection and/or diagnosis of SARS-CoV-2 by FDA under an Emergency Use Authorization (EUA). This EUA will remain in effect (meaning this test can be used) for the duration of the COVID-19 declaration under Section 564(b)(1) of the Act, 21 U.S.C. section 360bbb-3(b)(1), unless the authorization is terminated or revoked.  Performed at [facility], [address] [place], [place] [address]        Radiology Studies: DG Foot 2 Views Left  Result Date: 04/01/2021 CLINICAL DATA:  Redness and pain at dorsal surface of LEFT foot, no known injury EXAM: LEFT FOOT - 2 VIEW COMPARISON:  None FINDINGS: Osseous mineralization slightly decreased. Degenerative changes first MTP joint with joint space narrowing and spur formation. Remaining joint spaces preserved. No acute fracture, dislocation, or bone destruction. Tiny plantar calcaneal spur. Soft tissue swelling at dorsum of LEFT foot overlying the metatarsals. No radiopaque foreign bodies or soft tissue gas. IMPRESSION: Soft tissue swelling  without acute osseous abnormalities. Degenerative changes LEFT first MCP joint. Electronically Signed   By: Lavonia Dana M.D.   On: 04/01/2021 12:52   DG C-Arm 1-60 Min-No Report  Result Date: 03/31/2021 Fluoroscopy was utilized by the requesting  physician.  No radiographic interpretation.       LOS: 3 days   Amarius Toto [employer] on [email or web address]  04/02/2021, 10:42 AM

## 2021-04-02 NOTE — TOC Progression Note (Signed)
Transition of Care [facility]) - Progression Note    Patient Details  Name: [name] MRN: [id number] Date of Birth: 02-Jan-1941  [facility] CM/SW Contact  Vincent Mouton, RN Phone Number: 04/02/2021, 12:29 PM  Clinical Narrative:    Spoke with pt's wife concerning HHPT. [name] was selected, referral given to in house rep.    Expected Discharge Plan: [place] Barriers to Discharge: No Barriers Identified  Expected Discharge Plan and Services Expected Discharge Plan: [name]   Discharge Planning Services: CM Consult   Living arrangements for the past 2 months: Single Family Home                           HH Arranged: PT [place]: [name]   Time [place]: 1229 Representative spoke with at [place]: [name] ([place]) Interventions    Readmission Risk Interventions No flowsheet data found.

## 2021-04-03 DIAGNOSIS — N179 Acute kidney failure, unspecified: Secondary | ICD-10-CM | POA: Diagnosis not present

## 2021-04-03 DIAGNOSIS — N1831 Chronic kidney disease, stage 3a: Secondary | ICD-10-CM | POA: Diagnosis not present

## 2021-04-03 LAB — BASIC METABOLIC PANEL
Anion gap: 8 (ref 5–15)
BUN: 40 mg/dL — ABNORMAL HIGH (ref 8–23)
CO2: 23 mmol/L (ref 22–32)
Calcium: 9.2 mg/dL (ref 8.9–10.3)
Chloride: 109 mmol/L (ref 98–111)
Creatinine, Ser: 2.07 mg/dL — ABNORMAL HIGH (ref 0.61–1.24)
GFR, Estimated: 32 mL/min — ABNORMAL LOW (ref >60)
Glucose, Bld: 170 mg/dL — ABNORMAL HIGH (ref 70–99)
Potassium: 4.7 mmol/L (ref 3.5–5.1)
Sodium: 140 mmol/L (ref 135–145)

## 2021-04-03 LAB — GLUCOSE, CAPILLARY: Glucose-Capillary: 107 mg/dL — ABNORMAL HIGH (ref 70–99)

## 2021-04-03 MED ORDER — TORSEMIDE 20 MG PO TABS: ORAL_TABLET | ORAL | 3 refills | Status: DC

## 2021-04-03 MED ORDER — DOXYCYCLINE HYCLATE 100 MG PO TABS: 100.0000 mg | ORAL_TABLET | Freq: Two times a day (BID) | ORAL | 0 refills | Status: AC

## 2021-04-03 MED ORDER — CIPROFLOXACIN HCL 250 MG PO TABS: 250.0000 mg | ORAL_TABLET | Freq: Two times a day (BID) | ORAL | 0 refills | Status: AC

## 2021-04-03 NOTE — Discharge Summary (Signed)
[facility]  Physician Discharge Summary   Patient ID: [name] MRN: [id number] DOB/AGE: 1940/07/20 80 y.o.  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 male with medical history significant for CKD 3A, DMT2, HFpEF, HTN, AAA, hx of BPH and questionable history of prostate cancer (patient denies a history of cancer), OSA who presented for evalaution of left flank pain.  He reports he has had a yeast infection in his groin for the last few weeks and he has been on Diflucan at home.  His last dose was yesterday.  He reports has been having some urinary urgency with mild dysuria.  He reports he had kidney stones in 1969 but none since then.  Noted to have UTI on UA.  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   Consultants: Urology   Procedures:  Cystoscopy, left retrograde pyelogram with interpretation, left ureteral stent placement Left ureteroscopy and biopsy of the renal pelvic urothelium/mass   HOSPITAL COURSE:   Acute kidney injury superimposed on chronic kidney disease stage IIIa/hyponatremia It appears that his baseline creatinine  is around 2.0.  Presented with BUN of 84 and creatinine of 3.45.  There was thought to be an element of hypovolemia.  Patient also had hyponatremia.  He was started on IV fluids.  Renal function improved and will close to baseline now.   Acute metabolic encephalopathy Noted to be confused on 9/29 after his procedure.  Likely due to anesthesia.  Back to baseline.   Urinary tract infection UA noted to be abnormal.  Currently on ciprofloxacin.  He has allergies to cephalosporins.  Urine cultures with multiple species.  We will plan for a 5-day course.   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  Urology was consulted.  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  Foley catheter was inserted subsequently.  Hematuria was present.  Pathology from the biopsy is negative for malignancy.  Urine cytology is still pending.   Foley catheter discontinued.  Continues to have some hematuria which is to be expected since he does have a stent placed.  He was told to follow-up with his urologist.   Left foot pain and redness Apparently ongoing for a month or so.  He is tender over the dorsal aspect of the foot over the metatarsal region.  Imaging studies of the left foot does not show any injuries.  Uric acid is is 4.3.  WBC is normal.  He was started on doxycycline yesterday.  Wonder if this is erythromelalgia.  Has been ongoing for 1 to 2 months.  Discussed with patient and his daughter.   Patient started on doxycycline and will be continued on the same.  Outpatient follow-up with PCP.  The right foot was also noted to be erythematous but appears to have improved this morning.  Left foot remains about the same.  Sclerotic osseous lesions When asked about his history of prostate cancer patient absolutely denies any history of prostate cancer.  His PSA is noted to be 0.19.  Previously was being cared for by urologist, Dr.  Comer Locket, in [facility].   Discussed with [name] today with medical oncology.  She has reviewed his chart.  With normal PSA is unlikely that he has prostate cancer.  She thinks patient may need further work-up but this can be pursued in the outpatient setting including a PET scan.  Does not recommend pursuing biopsy at this time.  Will refer to medical oncology in the [place] area.    Nephrolithiasis Noted on CT scan but they are nonobstructing.  Currently asymptomatic.    History of COPD Continue home medications.  He has been weaned off of oxygen.   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  Obesity Estimated body mass index is 32.57 kg/m as calculated from the following:   Height as of this encounter: 5\' 5"  (1.651 m).   Weight as of this encounter: 88.8 kg.   Patient is stable.  Reassured about his hematuria.  Was told to seek attention if it worsens by calling the urologist.  Otherwise he seems to be stable for discharge at this time.   PERTINENT LABS:  The results of significant diagnostics from this hospitalization (including imaging, microbiology, ancillary and laboratory) are listed below for reference.    Microbiology: Recent Results (from the past 240 hour(s))  Urine Culture     Status: Abnormal   Collection Time: 03/29/21  6:11 PM   Specimen: Urine, Clean Catch  Result Value Ref Range Status   Specimen Description   Final    URINE, CLEAN CATCH Performed at [facility], [address], [place], [place] [address]    Special Requests   Final    NONE Performed at [facility], [address], [place], [place] [address]    Culture MULTIPLE SPECIES PRESENT, SUGGEST RECOLLECTION (A)  Final   Report Status 03/31/2021 FINAL   Final  Resp Panel by RT-PCR (Flu A&B, Covid) Nasopharyngeal Swab     Status: None   Collection Time: 03/30/21 12:30 AM   Specimen: Nasopharyngeal Swab; Nasopharyngeal(NP) swabs in vial transport medium  Result Value Ref Range Status   SARS Coronavirus 2 by RT PCR NEGATIVE NEGATIVE Final    Comment: (NOTE) SARS-CoV-2 target nucleic acids are NOT DETECTED.  The SARS-CoV-2 RNA is generally detectable in upper respiratory specimens during the acute phase of infection. The lowest concentration of SARS-CoV-2 viral copies this assay can detect is 138 copies/mL. A negative result does not preclude SARS-Cov-2 infection and should not be used as the sole basis for treatment or other patient management decisions. A negative result may occur with  improper specimen collection/handling, submission of specimen other than nasopharyngeal swab, presence of viral mutation(s) within the areas targeted by this assay, and inadequate number of viral copies(<138 copies/mL). A negative result must be combined with clinical observations, patient history, and epidemiological information. The expected result is Negative.  Fact Sheet for Patients:  EntrepreneurPulse.com.au  Fact Sheet for Healthcare Providers:  IncredibleEmployment.be  This test is no t yet approved or cleared by the  [place] Architectural technologist and  has been authorized for detection and/or diagnosis of SARS-CoV-2 by FDA under an Print production planner (EUA). This EUA will remain  in effect (meaning this test can be used) for the duration of the COVID-19 declaration under Section 564(b)(1) of the Act, 21 U.S.C.section 360bbb-3(b)(1), unless the authorization is terminated  or revoked sooner.       Influenza A by PCR NEGATIVE NEGATIVE Final   Influenza B by PCR NEGATIVE NEGATIVE Final    Comment: (NOTE) The Xpert Xpress SARS-CoV-2/FLU/RSV plus assay is intended as an aid in the diagnosis of influenza from  Nasopharyngeal swab specimens and should not be used as a sole basis for treatment. Nasal washings and aspirates are unacceptable for Xpert Xpress SARS-CoV-2/FLU/RSV testing.  Fact Sheet for Patients: EntrepreneurPulse.com.au  Fact Sheet for Healthcare Providers: IncredibleEmployment.be  This test is not yet approved or cleared by the [place] FDA and has been authorized for detection and/or diagnosis of SARS-CoV-2 by FDA under an Emergency Use Authorization (EUA). This EUA will remain in effect (meaning this test can be used) for the duration of the COVID-19 declaration under Section 564(b)(1) of the Act, 21 U.S.C. section 360bbb-3(b)(1), unless the authorization is terminated or revoked.  Performed at [facility], [address], [place], [place] [address]      Labs:  COVID-19 Labs  Lab Results  Component Value Date   [place] 03/30/2021      Basic Metabolic Panel: Recent Labs  Lab 03/29/21 1811 03/30/21 0711 03/31/21 0332 04/01/21 0338 04/02/21 0353 04/03/21 0355  NA 133* 134* 135 135 135 140  K 4.5 4.9 4.5 5.1 4.8 4.7  CL 97* 101 105 104 105 109  CO2 25 23 21* 24 23 23   GLUCOSE 162* 150* 127* 99 160* 170*  BUN 84* 86* 69* 52* 49* 40*  CREATININE 3.45* 2.95* 2.50* 2.34* 2.47* 2.07*  CALCIUM 10.3 10.1 9.8 9.5 8.7* 9.2  PHOS 4.3  --   --   --   --   --    Liver Function Tests: Recent Labs  Lab 03/29/21 1811 03/30/21 0711  AST 16 20  ALT 16 19  ALKPHOS 131* 150*  BILITOT 0.7 0.8  PROT 7.7 7.3  ALBUMIN 4.1 3.5    CBC: Recent Labs  Lab 03/29/21 1811 03/30/21 0711 03/31/21 0332 04/01/21 0338 04/02/21 0353  WBC 5.5 5.6 4.8 6.6 7.8  HGB 13.6 13.3 12.8* 13.0 13.5  HCT 44.5 43.5 41.9 43.4 45.0  MCV 89.4 89.5 89.9 91.8 92.0  PLT 177 153 154 149* 126*     CBG: Recent Labs  Lab 04/02/21 0741 04/02/21 1232 04/02/21 1618 04/02/21 2113 04/03/21 0725  GLUCAP 136* 195*  184* 184* 107*     IMAGING STUDIES DG Foot 2 Views Left  Result Date: 04/01/2021 CLINICAL DATA:  Redness and pain at dorsal surface of LEFT foot, no known injury EXAM: LEFT FOOT - 2 VIEW COMPARISON:  None FINDINGS: Osseous mineralization slightly decreased. Degenerative changes first MTP joint with joint space narrowing and spur formation. Remaining joint spaces preserved. No acute fracture, dislocation, or bone destruction. Tiny plantar calcaneal spur. Soft tissue swelling at dorsum of LEFT foot overlying the metatarsals. No radiopaque foreign bodies or soft tissue gas. IMPRESSION: Soft tissue swelling without acute osseous abnormalities. Degenerative changes LEFT first MCP joint. Electronically Signed   By: Lavonia Dana M.D.   On: 04/01/2021 12:52   DG C-Arm 1-60 Min-No Report  Result Date: 03/31/2021 Fluoroscopy was utilized  by the requesting physician.  No radiographic interpretation.   CT Renal Stone Study  Result Date: 03/29/2021 CLINICAL DATA:  Intermittent left flank pain x2 weeks EXAM: CT ABDOMEN AND PELVIS WITHOUT CONTRAST TECHNIQUE: Multidetector CT imaging of the abdomen and pelvis was performed following the standard protocol without IV contrast. COMPARISON:  08/11/2014 FINDINGS: Lower chest: Lung bases are clear. Hepatobiliary: Unenhanced liver is unremarkable. Gallbladder is unremarkable. No intrahepatic or extrahepatic duct dilatation. Pancreas: Within normal limits. Spleen: Within normal limits. Adrenals/Urinary Tract: Mild low-density thickening of the left adrenal gland (series 2/image 25), likely reflecting a benign adrenal adenoma. Right adrenal gland is within normal limits. Bilateral nonobstructing renal calculi measuring 3 mm right lower pole and 2 mm in the left lower pole (series 2/image 41). No hydronephrosis. Bilateral renal cysts, measuring up to 2.0 cm in the lateral left upper kidney (series 2/image 32). 12 mm hemorrhagic cyst in the anterior left lower kidney (series  2/image 40). 2.3 cm endophytic polypoid lesion in the left lower pole (series 2/image 39), poorly evaluated on unenhanced CT, but neoplasm including collecting system TCC is not excluded. Bladder is within normal limits. Stomach/Bowel: Stomach is notable for a tiny hiatal hernia. No evidence of bowel obstruction. Normal appendix (series 2/image 55). No colonic wall thickening or inflammatory changes. 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 Atherosclerotic calcifications of the abdominal aorta and branch vessels. Small para-aortic lymph nodes, including a 10 mm short axis left para-aortic node (series 2/image 38), new. Reproductive: Prostate is diminutive. Other: No abdominopelvic ascites. Small fat containing left paramidline infraumbilical ventral hernia containing a knuckle of sigmoid colon (series 2/image 64). Tiny fat containing periumbilical hernia. Mild laxity of the midline supraumbilical anterior abdominal wall (series 2/image 52). 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 IMPRESSION: 2.3 cm endophytic polypoid lesion in the left lower kidney, poorly evaluated on unenhanced CT, neoplasm such as collecting system TCC not excluded. Consider CT abdomen with/without contrast for further evaluation. Bilateral nonobstructing lower pole renal calculi measuring 2-3 mm. No hydronephrosis. Small para-nodes measuring up to 10 mm short axis, new. Attention on follow-up is suggested. 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 Electronically Signed   By: Julian Hy M.D.   On: 03/29/2021 23:28   VAS US CAROTID  Result Date: 03/22/2021 Carotid Arterial Duplex Study Patient Name:  MIECZYSLAW STAMAS  Date of Exam:   03/22/2021 Medical Rec #: [id number]        Accession  #:    [id number] Date of Birth: [birth year]        Patient Gender: M Patient Age:   67 years Exam Location:  [facility] Procedure:      VAS US CAROTID Referring Phys: Loralie Champagne --------------------------------------------------------------------------------  Indications:   Bilateral carotid artery stenosis [I65.23 (ICD-10-CM)]. Risk Factors:  Diabetes, coronary artery disease, PAD. Other Factors: History of aortic valve replacement with bioprosthetic valve, s/p                CABG, Ischemic cardiomyopathy. Performing Technologist: Luane School RDCS  Examination Guidelines: A complete evaluation includes B-mode imaging, spectral Doppler, color Doppler, and power Doppler as needed of all accessible portions of each vessel. Bilateral testing is considered an integral part of a complete examination. Limited examinations for reoccurring indications may be performed as noted.  Right Carotid Findings: +----------+--------+--------+--------+------------------+--------+  PSV cm/sEDV cm/sStenosisPlaque DescriptionComments +----------+--------+--------+--------+------------------+--------+ CCA Prox  45      16                                         +----------+--------+--------+--------+------------------+--------+ CCA Distal42      16                                         +----------+--------+--------+--------+------------------+--------+ ICA Prox  41      14      1-39%   calcific                   +----------+--------+--------+--------+------------------+--------+ ICA Mid   45      14                                         +----------+--------+--------+--------+------------------+--------+ ICA Distal47      20                                         +----------+--------+--------+--------+------------------+--------+ ECA       50      12                                         +----------+--------+--------+--------+------------------+--------+  +----------+--------+-------+----------------+-------------------+           PSV cm/sEDV cmsDescribe        Arm Pressure (mmHG) +----------+--------+-------+----------------+-------------------+ [id number]             Multiphasic, WNL                    +----------+--------+-------+----------------+-------------------+ +---------+--------+--+--------+-+---------+ VertebralPSV cm/s34EDV cm/s9Antegrade +---------+--------+--+--------+-+---------+  Left Carotid Findings: +----------+--------+--------+--------+------------------+--------+           PSV cm/sEDV cm/sStenosisPlaque DescriptionComments +----------+--------+--------+--------+------------------+--------+ CCA Prox  56      20                                         +----------+--------+--------+--------+------------------+--------+ CCA Distal46      16                                         +----------+--------+--------+--------+------------------+--------+ ICA Prox  107     42      40-59%  calcific                   +----------+--------+--------+--------+------------------+--------+ ICA Mid   69      20                                         +----------+--------+--------+--------+------------------+--------+ ICA Distal52      20                                         +----------+--------+--------+--------+------------------+--------+  ECA       58      16                                         +----------+--------+--------+--------+------------------+--------+ +----------+--------+--------+----------------+-------------------+           PSV cm/sEDV cm/sDescribe        Arm Pressure (mmHG) +----------+--------+--------+----------------+-------------------+ [id number]              Multiphasic, WNL                    +----------+--------+--------+----------------+-------------------+ +---------+--------+--+--------+--+ VertebralPSV cm/s32EDV cm/s14  +---------+--------+--+--------+--+   Summary: Right Carotid: Velocities in the right ICA are consistent with a 1-39% stenosis. Left Carotid: Velocities in the left ICA are consistent with a 40-59% stenosis. Vertebrals:  Bilateral vertebral arteries demonstrate antegrade flow. Subclavians: Normal flow hemodynamics were seen in bilateral subclavian              arteries. *See table(s) above for measurements and observations.  Electronically signed by Jenne Campus MD on 03/22/2021 at 4:52:17 PM.    Final     DISCHARGE EXAMINATION: Vitals:   04/02/21 1800 04/02/21 2100 04/03/21 0600 04/03/21 0936  BP: 131/83 114/74 115/83   Pulse: (!) 101 97 92   Resp: 20 20 20    Temp: 98 F (36.7 C) 97.8 F (36.6 C) 98 F (36.7 C)   TempSrc: Oral Oral Oral   SpO2: 96% (!) 86% 90% 98%  Weight:      Height:       General appearance: Awake alert.  In no distress Resp: Clear to auscultation bilaterally.  Normal effort Cardio: S1-S2 is normal regular.  No S3-S4.  No rubs murmurs or bruit GI: Abdomen is soft.  Nontender nondistended.  Bowel sounds are present normal.  No masses organomegaly Extremities: Persistent redness over the left foot. Neurologic: Alert and oriented x3.  No focal neurological deficits.    DISPOSITION: Home  Discharge Instructions     Ambulatory referral to Hematology / Oncology   Complete by: As directed    Needs to be seen by Onc at [facility] per [name] [facility]). Patient will need outpatient evaluation of sclerotic lesions noted on imaging studies. thanks   Call MD for:  difficulty breathing, headache or visual disturbances   Complete by: As directed    Call MD for:  extreme fatigue   Complete by: As directed    Call MD for:  persistant dizziness or light-headedness   Complete by: As directed    Call MD for:  persistant nausea and vomiting   Complete by: As directed    Call MD for:  severe uncontrolled pain   Complete by: As directed    Call MD for:  temperature >100.4    Complete by: As directed    Diet - low sodium heart healthy   Complete by: As directed    Diet Carb Modified   Complete by: As directed    Discharge instructions   Complete by: As directed    Please take your medications as prescribed.  Please call the urologist office for further issues with blood in the urine, if it increases significantly.  Referral has been sent to the cancer center in [place] to further evaluate the lesions noted in your vertebral bones.  You were cared for by a hospitalist during your hospital stay. If you have any  questions about your discharge medications or the care you received while you were in the hospital after you are discharged, you can call the unit and asked to speak with the hospitalist on call if the hospitalist that took care of you is not available. Once you are discharged, your primary care physician will handle any further medical issues. Please note that NO REFILLS for any discharge medications will be authorized once you are discharged, as it is imperative that you return to your primary care physician (or establish a relationship with a primary care physician if you do not have one) for your aftercare needs so that they can reassess your need for medications and monitor your lab values. If you do not have a primary care physician, you can call [phone] for a physician referral.   Increase activity slowly   Complete by: As directed    No wound care   Complete by: As directed          Allergies as of 04/03/2021       Reactions   Iodinated Diagnostic Agents Other (See Comments)   Decreased kidney function   Omnipaque [iohexol] Other (See Comments)   Decreased kidney function   Primaxin [imipenem] Hives   Cephalosporins Rash   Blisters   Lisinopril Nausea And Vomiting   Drops blood pressure   Clindamycin/lincomycin Swelling, Rash        Medication List     TAKE these medications    acetaminophen 500 MG tablet Commonly known as:  TYLENOL Take 1 tablet (500 mg total) by mouth every 6 (six) hours as needed.   albuterol 108 (90 Base) MCG/ACT inhaler Commonly known as: VENTOLIN HFA Inhale 2 puffs into the lungs every 6 (six) hours as needed for wheezing or shortness of breath.   allopurinol 300 MG tablet Commonly known as: ZYLOPRIM Take 300 mg by mouth daily.   amoxicillin 500 MG capsule Commonly known as: AMOXIL TAKE 4 CAPS 1 HOUR PRIOR TO TREATMENT   aspirin EC 81 MG tablet Take 81 mg by mouth daily.   atorvastatin 80 MG tablet Commonly known as: LIPITOR TAKE 1 TABLET (80 MG TOTAL) BY MOUTH DAILY BEFORE BREAKFAST.   bisoprolol 5 MG tablet Commonly known as: ZEBETA TAKE 1/2 TABLETS (2.5 MG TOTAL) BY MOUTH DAILY.   calcitRIOL 0.25 MCG capsule Commonly known as: ROCALTROL Take 0.25 mcg by mouth daily.   ciprofloxacin 250 MG tablet Commonly known as: CIPRO Take 1 tablet (250 mg total) by mouth 2 (two) times daily for 5 days.   dextromethorphan-guaiFENesin 30-600 MG 12hr tablet Commonly known as: MUCINEX DM Take 1 tablet by mouth 2 (two) times daily.   doxycycline 100 MG tablet Commonly known as: VIBRA-TABS Take 1 tablet (100 mg total) by mouth every 12 (twelve) hours for 5 days.   empagliflozin 10 MG Tabs tablet Commonly known as: Jardiance Take 1 tablet (10 mg total) by mouth daily before breakfast.   fexofenadine 180 MG tablet Commonly known as: ALLEGRA Take 90 mg by mouth daily.   FISH OIL MAXIMUM STRENGTH PO Take 1 capsule by mouth in the morning and at bedtime.   Insulin Pen Needle 31G X 4 MM Misc 1 each by Does not apply route daily. Use to inject insulin daily   JANUVIA PO Take 10 mg by mouth.   KLOR-CON 10 PO Take 2 tablets by mouth daily in the afternoon.   OneTouch Verio Soln Use to calibrate one touch verio level 3   OneTouch Verio test  strip Generic drug: glucose blood Use to check blood sugar once a day.   Ozempic (1 MG/DOSE) 4 MG/3ML Sopn Generic drug: Semaglutide  (1 MG/DOSE) INJECT 1 MG INTO THE SKIN ONCE A WEEK   pantoprazole 40 MG tablet Commonly known as: PROTONIX Take 40 mg by mouth at bedtime.   spironolactone 25 MG tablet Commonly known as: ALDACTONE Take 0.5 tablets (12.5 mg total) by mouth daily.   torsemide 20 MG tablet Commonly known as: DEMADEX Take 2 tablets (40 mg total) by mouth in the morning AND 1 tablet (20 mg total) every evening. RESUME ONLY ON 04/07/2021. Start taking on: April 07, 2021 What changed:  See the new instructions. These instructions start on April 07, 2021. If you are unsure what to do until then, ask your doctor or other care provider.   Trelegy Ellipta 100-62.5-25 MCG/INH Aepb Generic drug: Fluticasone-Umeclidin-Vilant Inhale 1 puff into the lungs daily.   Tyler Aas FlexTouch 200 UNIT/ML FlexTouch Pen Generic drug: insulin degludec Inject 28 Units into the skin daily. What changed: how much to take          Follow-up Information     Ardis Hughs, MD. Call.   Specialty: Urology Contact information: [place] [address] [phone]         [name], MD. Schedule an appointment as soon as possible for a visit in 1 week(s).   Specialty: Internal Medicine Contact information: [id number] [address] [place] [facility] [place] [facility].   Specialty: Oncology Why: they should call to schedule appointment Contact information: [place] [phone]                TOTAL DISCHARGE TIME: 35 minutes  [place]  [facility] Pager on [email or web address]  04/03/2021, 12:25 PM

## 2021-04-04 LAB — CYTOLOGY - NON PAP

## 2021-04-06 ENCOUNTER — Telehealth: Payer: Self-pay | Admitting: Oncology

## 2021-04-06 NOTE — Telephone Encounter (Signed)
Scheduled appt per 10/4 referral. Pt is aware of appt date and time.

## 2021-04-07 ENCOUNTER — Encounter (HOSPITAL_COMMUNITY): Payer: Self-pay

## 2021-04-19 NOTE — Progress Notes (Signed)
[facility]  [address] [place],  [place]  [id number] [phone]  Clinic Day:  04/20/2021  Referring physician: [name], MD  This document serves as a record of services personally performed by [name], MD. It was created on their behalf by [name],[name], a trained medical scribe. The creation of this record is based on the scribe's personal observations and the provider's statements to them.  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 This could be representative of multiple myeloma, and SPEP will be obtained. I also recommend that we obtain PET imaging as he will likely require biopsy and this could assist in targeting the most assessable area.   Former smoker, quit in 2014. He has been getting annual CT for lung cancer screening, most recent July 2021, with benign findings.  Chronic kidney disease, mildly worse. He has been following with a nephrologist.  Testicular pain. This has persisted for the past several weeks, but seemed to improve while he was on antifungal cream.   This is a pleasant 80 year old male with new sclerotic lesions of the thoracic spine including T11 and L1 through L3. This could represent multiple myeloma and so we will order an SPEP. We will also obtain PET scan to determine if there is evidence of a primary malignancy. He will eventually require biopsy. I explained that if this does represent malignancy, it would be considered a stage IV, and so would not be curative. Once we are able to pin down a diagnosis, then we would be able to discuss and finalize a treatment plan. However, we need to keep in mind that he has a performance status of 2 and so is likely not able to tolerate and aggressive treatment approach. We will likely pursue Guardant testing as well. He and his daughters understand and agree with this plan of  care. I have answered their questions and they know to call with any concerns.  Thank you for the opportunity to participate in the care of your patients   I provided 50 minutes of face-to-face time during this this encounter and > 50% was spent counseling as documented under my assessment and plan.    [name], MD [employer] [address] [place] [address] Dept: [phone] Dept Fax: [phone]    CHIEF COMPLAINT:  CC: Sclerotic osseous metastases  Current Treatment:  Diagnostics   HISTORY OF PRESENT ILLNESS:  [name] is a 80 y.o. male referred for the evaluation and treatment of bone lesions. This began when the patient presented to the [facility] emergency department on September 27th due to left sided flank pain, altered mental status and lethargy. He also had been experiencing pain in his testicles for several weeks. He was also found to have an elevated creatinine of 3.45.  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 A 2.3 cm endophytic polypoid lesion was also observed in the left lower kidney, as well as small para-aortic lymph nodes measuring up to 10 mm, new. Diagnostic ureteroscopy was performed and no obvious tumor or other abnormalities were seen. There was evidence of chronic inflammation and possibly a small abscess pocket. Cytology was negative. Biopsy of the left renal pelvis from September 29th was consistent with predominant blood  clot and inflammatory cells. A stent was placed and the patient felt significantly better. His kidney function also improved back to baseline of 2.0, and the patient was discharged. Digital rectal exam was negative. Of note, the patient has a history of transurethral microwave of the prostate for BPH. Despite his records, according to the patient, he has  never been diagnosed with prostate cancer. His PSA was 0.19 in the hospital.   INTERVAL HISTORY:  I have reviewed his chart and materials related to his cancer extensively and collaborated history with the patient and his daughters. Summary of oncologic history is as follows:  Oncology History   No history exists.    [name] states that his flank pain has resolved, but he continues to have testicular pain. He denies hematuria. He was undergoing routine lung cancer screening CT annually, but has not had one this year. Most recent from July 2021 revealed benign findings. He is able to ambulate at home with the assistance of a walker.  He continues physical therapy at home. He does spend about half the day in bed. He denies fever, but does report chills and some night sweats. He has sleep apnea, but has not been able to tolerate a CPAP at night. Hemoglobin is 13.8 today, and white count and platelets are normal. Chemistries are unremarkable except for a BUN of 55, a creatinine of 2.3, and an alkaline phosphatase of 220. His  appetite is good, and he is eating well. He does report weight loss but attributes this to his diuretic.  He denies fever, chills or other signs of infection.  He denies nausea, vomiting, bowel issues, or abdominal pain.  He denies sore throat, cough, dyspnea, or chest pain.  HISTORY:   Past Medical History:  Diagnosis Date   AAA (abdominal aortic aneurysm)    5/14 CT showed > 6 cm AAA, also right iliac aneurysm. Not stent graft candidate.    Anemia of chronic disease    Anxiety    pt. admits that he has anxiety at times    Aortic stenosis    Moderate to severe by echo in 4/14. Bioprosthetic #21 [facility] Ease aortic valve replacement in 4/14.    Arthritis    CAD (coronary artery disease)    a. 4/14 CABG: LIMA-LAD, seq SVG-ramus & OM1, SVG-PDA   Carotid arterial disease (HCC)    Carotid dopplers (8/93) with 73-42% LICA stenosis.    CHF (congestive heart failure) ([place])  07/2012; 10/17/2012   Chronic kidney disease    increased creatinine recently - being followed, near kidney failure fr. contrast dye    Chronic systolic heart failure ([place])    a. 1/14 ECHO: sev dil LV, EF30-35%, diff HK, mild MR, AS severe, AV grad 35 b. 8/14 ECHO: EF 55-60%, mild biopros AV sten mn grad. 25, RV mild dil, RA mild dil   Complication of anesthesia    during last surgery had to be given special medicine b/c "something dropped" during surgery    COPD (chronic obstructive pulmonary disease) ([facility])    a.  History of heavy smoking. PFTs (4/14) with mixed obstructive (COPD) and restrictive (post-ARDS) picture.    Diabetes mellitus without complication ([place])    Esophageal reflux    History of blood transfusion    "lots since January" (10/17/2012)   Hyperlipidemia    Hypertension    Ischemic cardiomyopathy    Ischemic cardiomyopathy    a. 4/14 LHC: pLAD 95, ost ramus 70-80, mLCx 90, EF 30%  OSA (obstructive sleep apnea)    on cpap- every sleep time.    Pneumonia 07/2012   PNA (H1N1 influenza + pneumococcus) in 3/00 complicated by respiratory failure/ARDS. Required tracheostomy, now weaned off. Had left empyema requiring chest tube.    Pneumonia due to COVID-19 virus 03/22/2019   Prediabetes    Shortness of breath    still goes deer hunting by himself    Past Surgical History:  Procedure Laterality Date   ABDOMINAL AORTIC ANEURYSM REPAIR N/A 07/10/2013   Procedure: Resection and Graftiong of perirenal AAA; Insertion 14 x 8 Hemashield Graft Aorta to Left Common Iliac and to Right Common Femoral Artery With Ligastion of Right External and Interanl Iliac Artery;  Surgeon: Mal Misty, MD;  Location: [facility];  Service: Vascular;  Laterality: N/A;   ANAL FISSURE REPAIR  2008   AORTIC VALVE REPLACEMENT N/A 10/25/2012   Procedure: AORTIC VALVE REPLACEMENT (AVR);  Surgeon: Melrose Nakayama, MD;  Location: [facility];  Service: Open Heart Surgery;  Laterality: N/A;   CARDIAC VALVE  REPLACEMENT     CORONARY ARTERY BYPASS GRAFT N/A 10/25/2012   Procedure: CORONARY ARTERY BYPASS GRAFTING (CABG);  Surgeon: Melrose Nakayama, MD;  Location: [facility];  Service: Open Heart Surgery;  Laterality: N/A;  times 4 using left internal mammary artery and endoscopically harvested bilateral saphenous vein    CYSTOSCOPY WITH RETROGRADE PYELOGRAM, URETEROSCOPY AND STENT PLACEMENT Left 03/31/2021   Procedure: CYSTOSCOPY WITH RETROGRADE PYELOGRAM, URETEROSCOPY, LEFT URETERAL mass biopsy, INSERTION LEFT URETERAL STENT;  Surgeon: Ardis Hughs, MD;  Location: [facility];  Service: Urology;  Laterality: Left;   EP IMPLANTABLE DEVICE N/A 09/22/2015   Procedure: BiV Upgrade;  Surgeon: Deboraha Sprang, MD;  Location: [facility];  Service: Cardiovascular;  Laterality: N/A;   INTRAOPERATIVE TRANSESOPHAGEAL ECHOCARDIOGRAM N/A 10/25/2012   Procedure: INTRAOPERATIVE TRANSESOPHAGEAL ECHOCARDIOGRAM;  Surgeon: Melrose Nakayama, MD;  Location: [facility];  Service: Open Heart Surgery;  Laterality: N/A;   LEFT AND RIGHT HEART CATHETERIZATION WITH CORONARY ANGIOGRAM N/A 10/22/2012   Procedure: LEFT AND RIGHT HEART CATHETERIZATION WITH CORONARY ANGIOGRAM;  Surgeon: Larey Dresser, MD;  Location: [facility] CATH LAB;  Service: Cardiovascular;  Laterality: N/A;   NASAL FRACTURE SURGERY  1970's   PACEMAKER REVISION  09/22/2015   pacemaker upgrade    PERMANENT PACEMAKER INSERTION N/A 07/15/2014   Procedure: PERMANENT PACEMAKER INSERTION;  Surgeon: Deboraha Sprang, MD;  Location: [facility] CATH LAB;  Service: Cardiovascular;  Laterality: N/A;   TRACHEOSTOMY  07/2012   TRACHEOSTOMY CLOSURE  08/2012   TRANSURETHRAL MICROWAVE THERAPY  10/15/2012    Family History  Problem Relation Age of Onset   Cancer Mother        breast   Heart disease Father    Congestive Heart Failure Other    Heart disease Other     Social History:  reports that he quit smoking about 8 years ago. His smoking use included cigarettes. He has a 116.00  pack-year smoking history. He has never used smokeless tobacco. He reports that he does not drink alcohol and does not use drugs.The patient is accompanied by his daughters today. He is married and lives at home with his wife and daughter. He has 3 children. He is retired from work, and has never been exposed to chemicals or other toxic agents.  Allergies:  Allergies  Allergen Reactions   Iodinated Diagnostic Agents Other (See Comments)    Decreased kidney function   Omnipaque [Iohexol] Other (See Comments)  Decreased kidney function   Primaxin [Imipenem] Hives   Cephalosporins Rash    Blisters    Lisinopril Nausea And Vomiting    Drops blood pressure    Clindamycin/Lincomycin Swelling and Rash    Current Medications: Current Outpatient Medications  Medication Sig Dispense Refill   albuterol (VENTOLIN HFA) 108 (90 Base) MCG/ACT inhaler Inhale 2 puffs into the lungs every 6 (six) hours as needed for wheezing or shortness of breath. 8 g 2   allopurinol (ZYLOPRIM) 300 MG tablet Take 300 mg by mouth daily.     aspirin EC 81 MG tablet Take 81 mg by mouth daily.     atorvastatin (LIPITOR) 80 MG tablet TAKE 1 TABLET (80 MG TOTAL) BY MOUTH DAILY BEFORE BREAKFAST. 90 tablet 3   bisoprolol (ZEBETA) 5 MG tablet TAKE 1/2 TABLETS (2.5 MG TOTAL) BY MOUTH DAILY. 45 tablet 3   calcitRIOL (ROCALTROL) 0.25 MCG capsule Take 0.25 mcg by mouth daily.      dextromethorphan-guaiFENesin (MUCINEX DM) 30-600 MG 12hr tablet Take 1 tablet by mouth 2 (two) times daily.     empagliflozin (JARDIANCE) 10 MG TABS tablet Take 1 tablet (10 mg total) by mouth daily before breakfast. 30 tablet 6   fexofenadine (ALLEGRA) 180 MG tablet Take 90 mg by mouth daily.      fluticasone furoate-vilanterol (BREO ELLIPTA) 100-25 MCG/ACT AEPB 1 puff daily.     Fluticasone-Umeclidin-Vilant (TRELEGY ELLIPTA) 100-62.5-25 MCG/INH AEPB Inhale 1 puff into the lungs daily. 1 each 6   insulin degludec (TRESIBA FLEXTOUCH) 200 UNIT/ML  FlexTouch Pen Inject 28 Units into the skin daily. (Patient taking differently: Inject 16 Units into the skin daily.)     KLOR-CON M10 10 MEQ tablet Take 20 mEq by mouth 2 (two) times daily.     Omega-3 Fatty Acids (FISH OIL MAXIMUM STRENGTH PO) Take 1 capsule by mouth in the morning and at bedtime.      OZEMPIC, 1 MG/DOSE, 4 MG/3ML SOPN INJECT 1 MG INTO THE SKIN ONCE A WEEK 9 mL 0   pantoprazole (PROTONIX) 40 MG tablet Take 40 mg by mouth at bedtime.      spironolactone (ALDACTONE) 25 MG tablet Take 0.5 tablets (12.5 mg total) by mouth daily. 15 tablet 6   torsemide (DEMADEX) 20 MG tablet Take 2 tablets (40 mg total) by mouth in the morning AND 1 tablet (20 mg total) every evening. RESUME ONLY ON 04/07/2021. (Patient taking differently: Take 1 tablets (20 mg total) by mouth in the morning AND 1 tablet (20 mg total) every evening. CHANGED DUE TO BP ISSUES) 360 tablet 3   acetaminophen (TYLENOL) 500 MG tablet Take 1 tablet (500 mg total) by mouth every 6 (six) hours as needed. 30 tablet 0   Blood Glucose Calibration (ONETOUCH VERIO) SOLN Use to calibrate one touch verio level 3 1 each 3   glucose blood (ONETOUCH VERIO) test strip Use to check blood sugar once a day. 100 strip 12   Insulin Pen Needle 31G X 4 MM MISC 1 each by Does not apply route daily. Use to inject insulin daily 100 each 2   No current facility-administered medications for this visit.    REVIEW OF SYSTEMS:  Review of Systems  Constitutional:  Positive for chills (chronic) and fatigue. Negative for appetite change, fever and unexpected weight change.  HENT:  Negative.    Eyes: Negative.   Respiratory:  Negative for chest tightness, cough, hemoptysis, shortness of breath and wheezing.  Sleep apnea, unable to tolerate CPAP  Cardiovascular: Negative.  Negative for chest pain, leg swelling and palpitations.  Gastrointestinal: Negative.  Negative for abdominal distention, abdominal pain, blood in stool, constipation, diarrhea,  nausea and vomiting.  Endocrine:       Occasional night sweats  Genitourinary:  Negative for difficulty urinating, dysuria, frequency and hematuria.        Testicular pain  Musculoskeletal:  Positive for gait problem (uses a walker to ambulate). Negative for arthralgias, back pain, flank pain and myalgias.  Skin: Negative.   Neurological:  Positive for gait problem (uses a walker to ambulate). Negative for dizziness, extremity weakness, headaches, light-headedness, numbness, seizures and speech difficulty.  Hematological: Negative.   Psychiatric/Behavioral:  Positive for confusion (occasional short term memory loss). Negative for depression and sleep disturbance. The patient is not nervous/anxious.   All other systems reviewed and are negative.   VITALS:  Blood pressure (!) 108/55, pulse 82, temperature (!) 97.4 F (36.3 C), temperature source Oral, resp. rate 18, height 5' 5" (1.651 m), weight 187 lb 12.8 oz (85.2 kg), SpO2 98 %.  Wt Readings from Last 3 Encounters:  04/20/21 187 lb 12.8 oz (85.2 kg)  03/30/21 195 lb 11.2 oz (88.8 kg)  02/23/21 216 lb 6.4 oz (98.2 kg)    Body mass index is 31.25 kg/m.  Performance status (ECOG): 2 - Symptomatic, <50% confined to bed  PHYSICAL EXAM:  Physical Exam Constitutional:      General: He is not in acute distress.    Appearance: Normal appearance. He is normal weight.  HENT:     Head: Normocephalic and atraumatic.  Eyes:     General: No scleral icterus.    Extraocular Movements: Extraocular movements intact.     Conjunctiva/sclera: Conjunctivae normal.     Pupils: Pupils are equal, round, and reactive to light.  Cardiovascular:     Rate and Rhythm: Normal rate and regular rhythm.     Pulses: Normal pulses.     Heart sounds: Normal heart sounds. No murmur heard.   No friction rub. No gallop.  Pulmonary:     Effort: Pulmonary effort is normal. No respiratory distress.     Breath sounds: Normal breath sounds.  Chest:     Comments:  Pacemaker Abdominal:     General: Bowel sounds are normal. There is no distension.     Palpations: Abdomen is soft. There is no hepatomegaly, splenomegaly or mass.     Tenderness: There is no abdominal tenderness.  Musculoskeletal:        General: Normal range of motion.     Cervical back: Normal range of motion and neck supple.     Right lower leg: No edema.     Left lower leg: No edema.  Lymphadenopathy:     Cervical: No cervical adenopathy.  Skin:    General: Skin is warm and dry.  Neurological:     General: No focal deficit present.     Mental Status: He is alert and oriented to person, place, and time. Mental status is at baseline.  Psychiatric:        Mood and Affect: Mood normal.        Behavior: Behavior normal.        Thought Content: Thought content normal.        Judgment: Judgment normal.    LABS:   CBC Latest Ref Rng & Units 04/20/2021 04/02/2021 04/01/2021  WBC - 9.5 7.8 6.6  Hemoglobin 13.5 - 17.5 13.8 13.5  13.0  Hematocrit 41 - 53 42 45.0 43.4  Platelets 150 - 399 210 126(L) 149(L)   CMP Latest Ref Rng & Units 04/20/2021 04/03/2021 04/02/2021  Glucose 70 - 99 mg/dL - 170(H) 160(H)  BUN 4 - 21 55(A) 40(H) 49(H)  Creatinine 0.6 - 1.3 2.3(A) 2.07(H) 2.47(H)  Sodium 137 - 147 138 140 135  Potassium 3.4 - 5.3 4.7 4.7 4.8  Chloride 99 - 108 104 109 105  CO2 13 - 22 24(A) 23 23  Calcium 8.7 - 10.7 9.6 9.2 8.7(L)  Total Protein 6.5 - 8.1 g/dL - - -  Total Bilirubin 0.3 - 1.2 mg/dL - - -  Alkaline Phos 25 - 125 220(A) - -  AST 14 - 40 33 - -  ALT 10 - 40 35 - -     No results found for: CEA1 / No results found for: CEA1 No results found for: PSA1 No results found for: [id number] No results found for: QHK257  Lab Results  Component Value Date   TOTALPROTELP 6.4 01/03/2013   ALBUMINELP 47.4 (L) 01/03/2013   A1GS 6.2 (H) 01/03/2013   A2GS 11.0 01/03/2013   BETS 5.5 01/03/2013   BETA2SER 5.2 01/03/2013   GAMS 24.7 (H) 01/03/2013   MSPIKE NOT DETECTED  01/03/2013   SPEI (NOTE) 01/03/2013   Lab Results  Component Value Date   TIBC 176 (L) 07/23/2013   TIBC 226 01/04/2013   TIBC 233 01/03/2013   FERRITIN 1,109 (H) 03/26/2019   FERRITIN 1,151 (H) 03/25/2019   FERRITIN 973 (H) 03/24/2019   IRONPCTSAT 7 (L) 07/23/2013   IRONPCTSAT 27 01/04/2013   IRONPCTSAT 24 01/03/2013   Lab Results  Component Value Date   LDH 201 07/31/2012    STUDIES:  DG Foot 2 Views Left  Result Date: 04/01/2021 CLINICAL DATA:  Redness and pain at dorsal surface of LEFT foot, no known injury EXAM: LEFT FOOT - 2 VIEW COMPARISON:  None FINDINGS: Osseous mineralization slightly decreased. Degenerative changes first MTP joint with joint space narrowing and spur formation. Remaining joint spaces preserved. No acute fracture, dislocation, or bone destruction. Tiny plantar calcaneal spur. Soft tissue swelling at dorsum of LEFT foot overlying the metatarsals. No radiopaque foreign bodies or soft tissue gas. IMPRESSION: Soft tissue swelling without acute osseous abnormalities. Degenerative changes LEFT first MCP joint. Electronically Signed   By: Lavonia Dana M.D.   On: 04/01/2021 12:52   DG C-Arm 1-60 Min-No Report  Result Date: 03/31/2021 Fluoroscopy was utilized by the requesting physician.  No radiographic interpretation.   CT Renal Stone Study  Result Date: 03/29/2021 CLINICAL DATA:  Intermittent left flank pain x2 weeks EXAM: CT ABDOMEN AND PELVIS WITHOUT CONTRAST TECHNIQUE: Multidetector CT imaging of the abdomen and pelvis was performed following the standard protocol without IV contrast. COMPARISON:  08/11/2014 FINDINGS: Lower chest: Lung bases are clear. Hepatobiliary: Unenhanced liver is unremarkable. Gallbladder is unremarkable. No intrahepatic or extrahepatic duct dilatation. Pancreas: Within normal limits. Spleen: Within normal limits. 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 Right adrenal gland is within normal limits. Bilateral nonobstructing renal calculi measuring 3 mm right lower pole and 2 mm in the left lower pole (series 2/image 41). No hydronephrosis. Bilateral renal cysts, measuring up to 2.0 cm in the lateral left upper kidney (series 2/image 32). 12 mm hemorrhagic cyst in the anterior left lower kidney (series 2/image 40). 2.3 cm endophytic polypoid lesion in the left lower  pole (series 2/image 39), poorly evaluated on unenhanced CT, but neoplasm including collecting system TCC is not excluded. Bladder is within normal limits. Stomach/Bowel: Stomach is notable for a tiny hiatal hernia. No evidence of bowel obstruction. Normal appendix (series 2/image 55). No colonic wall thickening or inflammatory changes. 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 Atherosclerotic calcifications of the abdominal aorta and branch vessels. Small para-aortic lymph nodes, including a 10 mm short axis left para-aortic node (series 2/image 38), new. Reproductive: Prostate is diminutive. Other: No abdominopelvic ascites. Small fat containing left paramidline infraumbilical ventral hernia containing a knuckle of sigmoid colon (series 2/image 64). Tiny fat containing periumbilical hernia. Mild laxity of the midline supraumbilical anterior abdominal wall (series 2/image 52). 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 IMPRESSION: 2.3 cm endophytic polypoid lesion in the left lower kidney, poorly evaluated on unenhanced CT, neoplasm such as collecting system TCC not excluded. Consider CT abdomen with/without contrast for further evaluation. Bilateral nonobstructing lower pole renal calculi measuring 2-3 mm. No hydronephrosis. Small para-nodes measuring up to 10 mm short axis, new. Attention on  follow-up is suggested. 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 Electronically Signed   By: Julian Hy M.D.   On: 03/29/2021 23:28   VAS US CAROTID  Result Date: 03/22/2021 Carotid Arterial Duplex Study Patient Name:  [name]  Date of Exam:   03/22/2021 Medical Rec #: [id number]        Accession #:    [id number] Date of Birth: 1941/02/13        Patient Gender: M Patient Age:   48 years Exam Location:  [facility] Procedure:      VAS US CAROTID Referring Phys: Loralie Champagne --------------------------------------------------------------------------------  Indications:   Bilateral carotid artery stenosis [I65.23 (ICD-10-CM)]. Risk Factors:  Diabetes, coronary artery disease, PAD. Other Factors: History of aortic valve replacement with bioprosthetic valve, s/p                CABG, Ischemic cardiomyopathy. Performing Technologist: Luane School RDCS  Examination Guidelines: A complete evaluation includes B-mode imaging, spectral Doppler, color Doppler, and power Doppler as needed of all accessible portions of each vessel. Bilateral testing is considered an integral part of a complete examination. Limited examinations for reoccurring indications may be performed as noted.  Right Carotid Findings: +----------+--------+--------+--------+------------------+--------+           PSV cm/sEDV cm/sStenosisPlaque DescriptionComments +----------+--------+--------+--------+------------------+--------+ CCA Prox  45      16                                         +----------+--------+--------+--------+------------------+--------+ CCA Distal42      16                                         +----------+--------+--------+--------+------------------+--------+ ICA Prox  41      14      1-39%   calcific                   +----------+--------+--------+--------+------------------+--------+ ICA Mid   45      14                                          +----------+--------+--------+--------+------------------+--------+  ICA Distal47      20                                         +----------+--------+--------+--------+------------------+--------+ ECA       50      12                                         +----------+--------+--------+--------+------------------+--------+ +----------+--------+-------+----------------+-------------------+           PSV cm/sEDV cmsDescribe        Arm Pressure (mmHG) +----------+--------+-------+----------------+-------------------+ [id number]             Multiphasic, WNL                    +----------+--------+-------+----------------+-------------------+ +---------+--------+--+--------+-+---------+ VertebralPSV cm/s34EDV cm/s9Antegrade +---------+--------+--+--------+-+---------+  Left Carotid Findings: +----------+--------+--------+--------+------------------+--------+           PSV cm/sEDV cm/sStenosisPlaque DescriptionComments +----------+--------+--------+--------+------------------+--------+ CCA Prox  56      20                                         +----------+--------+--------+--------+------------------+--------+ CCA Distal46      16                                         +----------+--------+--------+--------+------------------+--------+ ICA Prox  107     42      40-59%  calcific                   +----------+--------+--------+--------+------------------+--------+ ICA Mid   69      20                                         +----------+--------+--------+--------+------------------+--------+ ICA Distal52      20                                         +----------+--------+--------+--------+------------------+--------+ ECA       58      16                                         +----------+--------+--------+--------+------------------+--------+ +----------+--------+--------+----------------+-------------------+            PSV cm/sEDV cm/sDescribe        Arm Pressure (mmHG) +----------+--------+--------+----------------+-------------------+ [id number]              Multiphasic, WNL                    +----------+--------+--------+----------------+-------------------+ +---------+--------+--+--------+--+ VertebralPSV cm/s32EDV cm/s14 +---------+--------+--+--------+--+   Summary: Right Carotid: Velocities in the right ICA are consistent with a 1-39% stenosis. Left Carotid: Velocities in the left ICA are consistent with a 40-59% stenosis. Vertebrals:  Bilateral vertebral arteries demonstrate antegrade flow. Subclavians: Normal flow hemodynamics were seen in bilateral subclavian  arteries. *See table(s) above for measurements and observations.  Electronically signed by Jenne Campus MD on 03/22/2021 at 4:52:17 PM.    Final      FINAL MICROSCOPIC DIAGNOSIS: 03/31/2021  A. RENAL PELVIS, LEFT, BIOPSY:  Predominant blood clot and inflammatory cells     I, Rita Ohara, am acting as scribe for [name], MD  I have reviewed this report as typed by the medical scribe, and it is complete and accurate.

## 2021-04-20 ENCOUNTER — Other Ambulatory Visit: Payer: Self-pay | Admitting: Oncology

## 2021-04-20 ENCOUNTER — Encounter: Payer: Self-pay | Admitting: Oncology

## 2021-04-20 ENCOUNTER — Inpatient Hospital Stay: Payer: Medicare Other | Attending: Oncology | Admitting: Oncology

## 2021-04-20 ENCOUNTER — Other Ambulatory Visit: Payer: Self-pay | Admitting: Hematology and Oncology

## 2021-04-20 ENCOUNTER — Inpatient Hospital Stay: Payer: Medicare Other

## 2021-04-20 VITALS — BP 108/55 | HR 82 | Temp 97.4°F | Resp 18 | Ht 65.0 in | Wt 187.8 lb

## 2021-04-20 DIAGNOSIS — N189 Chronic kidney disease, unspecified: Secondary | ICD-10-CM | POA: Diagnosis not present

## 2021-04-20 DIAGNOSIS — N50819 Testicular pain, unspecified: Secondary | ICD-10-CM | POA: Insufficient documentation

## 2021-04-20 DIAGNOSIS — C7951 Secondary malignant neoplasm of bone: Secondary | ICD-10-CM | POA: Insufficient documentation

## 2021-04-20 DIAGNOSIS — C642 Malignant neoplasm of left kidney, except renal pelvis: Secondary | ICD-10-CM

## 2021-04-20 DIAGNOSIS — D49519 Neoplasm of unspecified behavior of unspecified kidney: Secondary | ICD-10-CM

## 2021-04-20 DIAGNOSIS — Z87891 Personal history of nicotine dependence: Secondary | ICD-10-CM | POA: Insufficient documentation

## 2021-04-20 LAB — BASIC METABOLIC PANEL WITH GFR
BUN: 55 — AB (ref 4–21)
CO2: 24 — AB (ref 13–22)
Chloride: 104 (ref 99–108)
Creatinine: 2.3 — AB (ref 0.6–1.3)
Glucose: 150
Potassium: 4.7 (ref 3.4–5.3)
Sodium: 138 (ref 137–147)

## 2021-04-20 LAB — CBC: RBC: 4.95 (ref 3.87–5.11)

## 2021-04-20 LAB — HEPATIC FUNCTION PANEL
ALT: 35 (ref 10–40)
AST: 33 (ref 14–40)
Alkaline Phosphatase: 220 — AB (ref 25–125)
Bilirubin, Total: 0.9

## 2021-04-20 LAB — PROTIME-INR
INR: 1 (ref 0.8–1.2)
Prothrombin Time: 13.2 seconds (ref 11.4–15.2)

## 2021-04-20 LAB — CBC AND DIFFERENTIAL
HCT: 42 (ref 41–53)
Hemoglobin: 13.8 (ref 13.5–17.5)
Neutrophils Absolute: 8.08
Platelets: 210 (ref 150–399)
WBC: 9.5

## 2021-04-20 LAB — COMPREHENSIVE METABOLIC PANEL
Albumin: 3.9 (ref 3.5–5.0)
Calcium: 9.6 (ref 8.7–10.7)

## 2021-04-20 LAB — PSA: Prostatic Specific Antigen: 0.18 ng/mL (ref 0.00–4.00)

## 2021-04-20 NOTE — Progress Notes (Deleted)
HEMATOLOGY-ONCOLOGY ELECTRONIC VISIT PROGRESS NOTE  Patient Care Team: [name], MD as PCP - General (Internal Medicine) Larey Dresser, MD as Attending Physician (Cardiology)  I connected with the patient via telephone conference and verified that I am speaking with the correct person using two identifiers. The patient's location is at home and I am providing care from the [facility] I discussed the limitations, risks, security and privacy concerns of performing an evaluation and management service by e-visits and the availability of in person appointments.  I also discussed with the patient that there may be a patient responsible charge related to this service. The patient expressed understanding and agreed to proceed.   ASSESSMENT & PLAN:  No problem-specific Assessment & Plan notes found for this encounter.   No orders of the defined types were placed in this encounter.   INTERVAL HISTORY: Please see below for problem oriented charting. The purpose of today's discussion is ***   SUMMARY OF ONCOLOGIC HISTORY: Oncology History   No history exists.    REVIEW OF SYSTEMS:   Constitutional: Denies fevers, chills or abnormal weight loss Eyes: Denies blurriness of vision Ears, nose, mouth, throat, and face: Denies mucositis or sore throat Respiratory: Denies cough, dyspnea or wheezes Cardiovascular: Denies palpitation, chest discomfort Gastrointestinal:  Denies nausea, heartburn or change in bowel habits Skin: Denies abnormal skin rashes Lymphatics: Denies new lymphadenopathy or easy bruising Neurological:Denies numbness, tingling or new weaknesses Behavioral/Psych: Mood is stable, no new changes  Extremities: No lower extremity edema All other systems were reviewed with the patient and are negative.  I have reviewed the past medical history, past surgical history, social history and family history with the patient and they are unchanged from previous  note.  ALLERGIES:  is allergic to iodinated diagnostic agents, omnipaque [iohexol], primaxin [imipenem], cephalosporins, lisinopril, and clindamycin/lincomycin.  MEDICATIONS:  Current Outpatient Medications  Medication Sig Dispense Refill   acetaminophen (TYLENOL) 500 MG tablet Take 1 tablet (500 mg total) by mouth every 6 (six) hours as needed. 30 tablet 0   albuterol (VENTOLIN HFA) 108 (90 Base) MCG/ACT inhaler Inhale 2 puffs into the lungs every 6 (six) hours as needed for wheezing or shortness of breath. 8 g 2   allopurinol (ZYLOPRIM) 300 MG tablet Take 300 mg by mouth daily.     aspirin EC 81 MG tablet Take 81 mg by mouth daily.     atorvastatin (LIPITOR) 80 MG tablet TAKE 1 TABLET (80 MG TOTAL) BY MOUTH DAILY BEFORE BREAKFAST. 90 tablet 3   bisoprolol (ZEBETA) 5 MG tablet TAKE 1/2 TABLETS (2.5 MG TOTAL) BY MOUTH DAILY. 45 tablet 3   Blood Glucose Calibration (ONETOUCH VERIO) SOLN Use to calibrate one touch verio level 3 1 each 3   calcitRIOL (ROCALTROL) 0.25 MCG capsule Take 0.25 mcg by mouth daily.      dextromethorphan-guaiFENesin (MUCINEX DM) 30-600 MG 12hr tablet Take 1 tablet by mouth 2 (two) times daily.     empagliflozin (JARDIANCE) 10 MG TABS tablet Take 1 tablet (10 mg total) by mouth daily before breakfast. 30 tablet 6   fexofenadine (ALLEGRA) 180 MG tablet Take 90 mg by mouth daily.      fluticasone furoate-vilanterol (BREO ELLIPTA) 100-25 MCG/ACT AEPB 1 puff daily.     Fluticasone-Umeclidin-Vilant (TRELEGY ELLIPTA) 100-62.5-25 MCG/INH AEPB Inhale 1 puff into the lungs daily. 1 each 6   glucose blood (ONETOUCH VERIO) test strip Use to check blood sugar once a day. 100 strip 12   insulin degludec (TRESIBA  FLEXTOUCH) 200 UNIT/ML FlexTouch Pen Inject 28 Units into the skin daily. (Patient taking differently: Inject 16 Units into the skin daily.)     Insulin Pen Needle 31G X 4 MM MISC 1 each by Does not apply route daily. Use to inject insulin daily 100 each 2   KLOR-CON M10 10  MEQ tablet Take 20 mEq by mouth 2 (two) times daily.     Omega-3 Fatty Acids (FISH OIL MAXIMUM STRENGTH PO) Take 1 capsule by mouth in the morning and at bedtime.      OZEMPIC, 1 MG/DOSE, 4 MG/3ML SOPN INJECT 1 MG INTO THE SKIN ONCE A WEEK 9 mL 0   pantoprazole (PROTONIX) 40 MG tablet Take 40 mg by mouth at bedtime.      spironolactone (ALDACTONE) 25 MG tablet Take 0.5 tablets (12.5 mg total) by mouth daily. 15 tablet 6   torsemide (DEMADEX) 20 MG tablet Take 2 tablets (40 mg total) by mouth in the morning AND 1 tablet (20 mg total) every evening. RESUME ONLY ON 04/07/2021. (Patient taking differently: Take 1 tablets (20 mg total) by mouth in the morning AND 1 tablet (20 mg total) every evening. CHANGED DUE TO BP ISSUES) 360 tablet 3   No current facility-administered medications for this visit.    PHYSICAL EXAMINATION: ECOG PERFORMANCE STATUS: {CHL ONC ECOG [id number]  LABORATORY DATA:  I have reviewed the data as listed CMP Latest Ref Rng & Units 04/20/2021 04/03/2021 04/02/2021  Glucose 70 - 99 mg/dL - 170(H) 160(H)  BUN 4 - 21 55(A) 40(H) 49(H)  Creatinine 0.6 - 1.3 2.3(A) 2.07(H) 2.47(H)  Sodium 137 - 147 138 140 135  Potassium 3.4 - 5.3 4.7 4.7 4.8  Chloride 99 - 108 104 109 105  CO2 13 - 22 24(A) 23 23  Calcium 8.7 - 10.7 9.6 9.2 8.7(L)  Total Protein 6.5 - 8.1 g/dL - - -  Total Bilirubin 0.3 - 1.2 mg/dL - - -  Alkaline Phos 25 - 125 220(A) - -  AST 14 - 40 33 - -  ALT 10 - 40 35 - -    Lab Results  Component Value Date   WBC 9.5 04/20/2021   HGB 13.8 04/20/2021   HCT 42 04/20/2021   MCV 92.0 04/02/2021   PLT 210 04/20/2021   NEUTROABS 8.08 04/20/2021     RADIOGRAPHIC STUDIES: I have personally reviewed the radiological images as listed and agreed with the findings in the report. DG Foot 2 Views Left  Result Date: 04/01/2021 CLINICAL DATA:  Redness and pain at dorsal surface of LEFT foot, no known injury EXAM: LEFT FOOT - 2 VIEW COMPARISON:  None FINDINGS:  Osseous mineralization slightly decreased. Degenerative changes first MTP joint with joint space narrowing and spur formation. Remaining joint spaces preserved. No acute fracture, dislocation, or bone destruction. Tiny plantar calcaneal spur. Soft tissue swelling at dorsum of LEFT foot overlying the metatarsals. No radiopaque foreign bodies or soft tissue gas. IMPRESSION: Soft tissue swelling without acute osseous abnormalities. Degenerative changes LEFT first MCP joint. Electronically Signed   By: Lavonia Dana M.D.   On: 04/01/2021 12:52   DG C-Arm 1-60 Min-No Report  Result Date: 03/31/2021 Fluoroscopy was utilized by the requesting physician.  No radiographic interpretation.   CT Renal Stone Study  Result Date: 03/29/2021 CLINICAL DATA:  Intermittent left flank pain x2 weeks EXAM: CT ABDOMEN AND PELVIS WITHOUT CONTRAST TECHNIQUE: Multidetector CT imaging of the abdomen and pelvis was performed following the standard protocol without IV  contrast. COMPARISON:  08/11/2014 FINDINGS: Lower chest: Lung bases are clear. Hepatobiliary: Unenhanced liver is unremarkable. Gallbladder is unremarkable. No intrahepatic or extrahepatic duct dilatation. Pancreas: Within normal limits. Spleen: Within normal limits. Adrenals/Urinary Tract: Mild low-density thickening of the left adrenal gland (series 2/image 25), likely reflecting a benign adrenal adenoma. Right adrenal gland is within normal limits. Bilateral nonobstructing renal calculi measuring 3 mm right lower pole and 2 mm in the left lower pole (series 2/image 41). No hydronephrosis. Bilateral renal cysts, measuring up to 2.0 cm in the lateral left upper kidney (series 2/image 32). 12 mm hemorrhagic cyst in the anterior left lower kidney (series 2/image 40). 2.3 cm endophytic polypoid lesion in the left lower pole (series 2/image 39), poorly evaluated on unenhanced CT, but neoplasm including collecting system TCC is not excluded. Bladder is within normal limits.  Stomach/Bowel: Stomach is notable for a tiny hiatal hernia. No evidence of bowel obstruction. Normal appendix (series 2/image 55). No colonic wall thickening or inflammatory changes. 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 Atherosclerotic calcifications of the abdominal aorta and branch vessels. Small para-aortic lymph nodes, including a 10 mm short axis left para-aortic node (series 2/image 38), new. Reproductive: Prostate is diminutive. Other: No abdominopelvic ascites. Small fat containing left paramidline infraumbilical ventral hernia containing a knuckle of sigmoid colon (series 2/image 64). Tiny fat containing periumbilical hernia. Mild laxity of the midline supraumbilical anterior abdominal wall (series 2/image 52). 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 IMPRESSION: 2.3 cm endophytic polypoid lesion in the left lower kidney, poorly evaluated on unenhanced CT, neoplasm such as collecting system TCC not excluded. Consider CT abdomen with/without contrast for further evaluation. Bilateral nonobstructing lower pole renal calculi measuring 2-3 mm. No hydronephrosis. Small para-nodes measuring up to 10 mm short axis, new. Attention on follow-up is suggested. 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 Electronically Signed   By: Julian Hy M.D.   On: 03/29/2021 23:28   VAS US CAROTID  Result Date: 03/22/2021 Carotid Arterial Duplex Study Patient Name:  [name]  Date of Exam:   03/22/2021 Medical Rec #: [id number]        Accession #:    [id number] Date of Birth: 1941-03-02        Patient Gender: M Patient Age:   79 years Exam Location:  [place] Procedure:      VAS US CAROTID Referring Phys: Loralie Champagne  --------------------------------------------------------------------------------  Indications:   Bilateral carotid artery stenosis [I65.23 (ICD-10-CM)]. Risk Factors:  Diabetes, coronary artery disease, PAD. Other Factors: History of aortic valve replacement with bioprosthetic valve, s/p                CABG, Ischemic cardiomyopathy. Performing Technologist: Luane School RDCS  Examination Guidelines: A complete evaluation includes B-mode imaging, spectral Doppler, color Doppler, and power Doppler as needed of all accessible portions of each vessel. Bilateral testing is considered an integral part of a complete examination. Limited examinations for reoccurring indications may be performed as noted.  Right Carotid Findings: +----------+--------+--------+--------+------------------+--------+           PSV cm/sEDV cm/sStenosisPlaque DescriptionComments +----------+--------+--------+--------+------------------+--------+ CCA Prox  45      16                                         +----------+--------+--------+--------+------------------+--------+  CCA Distal42      16                                         +----------+--------+--------+--------+------------------+--------+ ICA Prox  41      14      1-39%   calcific                   +----------+--------+--------+--------+------------------+--------+ ICA Mid   45      14                                         +----------+--------+--------+--------+------------------+--------+ ICA Distal47      20                                         +----------+--------+--------+--------+------------------+--------+ ECA       50      12                                         +----------+--------+--------+--------+------------------+--------+ +----------+--------+-------+----------------+-------------------+           PSV cm/sEDV cmsDescribe        Arm Pressure (mmHG) +----------+--------+-------+----------------+-------------------+  [id number]             Multiphasic, WNL                    +----------+--------+-------+----------------+-------------------+ +---------+--------+--+--------+-+---------+ VertebralPSV cm/s34EDV cm/s9Antegrade +---------+--------+--+--------+-+---------+  Left Carotid Findings: +----------+--------+--------+--------+------------------+--------+           PSV cm/sEDV cm/sStenosisPlaque DescriptionComments +----------+--------+--------+--------+------------------+--------+ CCA Prox  56      20                                         +----------+--------+--------+--------+------------------+--------+ CCA Distal46      16                                         +----------+--------+--------+--------+------------------+--------+ ICA Prox  107     42      40-59%  calcific                   +----------+--------+--------+--------+------------------+--------+ ICA Mid   69      20                                         +----------+--------+--------+--------+------------------+--------+ ICA Distal52      20                                         +----------+--------+--------+--------+------------------+--------+ ECA       58      16                                         +----------+--------+--------+--------+------------------+--------+ +----------+--------+--------+----------------+-------------------+  PSV cm/sEDV cm/sDescribe        Arm Pressure (mmHG) +----------+--------+--------+----------------+-------------------+ [id number]              Multiphasic, WNL                    +----------+--------+--------+----------------+-------------------+ +---------+--------+--+--------+--+ VertebralPSV cm/s32EDV cm/s14 +---------+--------+--+--------+--+   Summary: Right Carotid: Velocities in the right ICA are consistent with a 1-39% stenosis. Left Carotid: Velocities in the left ICA are consistent with a 40-59% stenosis. Vertebrals:   Bilateral vertebral arteries demonstrate antegrade flow. Subclavians: Normal flow hemodynamics were seen in bilateral subclavian              arteries. *See table(s) above for measurements and observations.  Electronically signed by Jenne Campus MD on 03/22/2021 at 4:52:17 PM.    Final     I discussed the assessment and treatment plan with the patient. The patient was provided an opportunity to ask questions and all were answered. The patient agreed with the plan and demonstrated an understanding of the instructions. The patient was advised to call back or seek an in-person evaluation if the symptoms worsen or if the condition fails to improve as anticipated.    I spent {CHL ONC TIME VISIT - PTWSF:[id number]} for the appointment reviewing test results, discuss management and coordination of care.  [name], MD 04/20/2021 7:15 PM

## 2021-04-20 NOTE — Progress Notes (Deleted)
HEMATOLOGY-ONCOLOGY ELECTRONIC VISIT PROGRESS NOTE  Patient Care Team: [name], MD as PCP - General (Internal Medicine) Larey Dresser, MD as Attending Physician (Cardiology)  I connected with the patient via telephone conference and verified that I am speaking with the correct person using two identifiers. The patient's location is at home and I am providing care from the [facility] I discussed the limitations, risks, security and privacy concerns of performing an evaluation and management service by e-visits and the availability of in person appointments.  I also discussed with the patient that there may be a patient responsible charge related to this service. The patient expressed understanding and agreed to proceed.   ASSESSMENT & PLAN:  No problem-specific Assessment & Plan notes found for this encounter.   No orders of the defined types were placed in this encounter.   INTERVAL HISTORY: Please see below for problem oriented charting. The purpose of today's discussion is ***   SUMMARY OF ONCOLOGIC HISTORY: Oncology History   No history exists.    REVIEW OF SYSTEMS:   Constitutional: Denies fevers, chills or abnormal weight loss Eyes: Denies blurriness of vision Ears, nose, mouth, throat, and face: Denies mucositis or sore throat Respiratory: Denies cough, dyspnea or wheezes Cardiovascular: Denies palpitation, chest discomfort Gastrointestinal:  Denies nausea, heartburn or change in bowel habits Skin: Denies abnormal skin rashes Lymphatics: Denies new lymphadenopathy or easy bruising Neurological:Denies numbness, tingling or new weaknesses Behavioral/Psych: Mood is stable, no new changes  Extremities: No lower extremity edema All other systems were reviewed with the patient and are negative.  I have reviewed the past medical history, past surgical history, social history and family history with the patient and they are unchanged from previous  note.  ALLERGIES:  is allergic to iodinated diagnostic agents, omnipaque [iohexol], primaxin [imipenem], cephalosporins, lisinopril, and clindamycin/lincomycin.  MEDICATIONS:  Current Outpatient Medications  Medication Sig Dispense Refill   acetaminophen (TYLENOL) 500 MG tablet Take 1 tablet (500 mg total) by mouth every 6 (six) hours as needed. 30 tablet 0   albuterol (VENTOLIN HFA) 108 (90 Base) MCG/ACT inhaler Inhale 2 puffs into the lungs every 6 (six) hours as needed for wheezing or shortness of breath. 8 g 2   allopurinol (ZYLOPRIM) 300 MG tablet Take 300 mg by mouth daily.     aspirin EC 81 MG tablet Take 81 mg by mouth daily.     atorvastatin (LIPITOR) 80 MG tablet TAKE 1 TABLET (80 MG TOTAL) BY MOUTH DAILY BEFORE BREAKFAST. 90 tablet 3   bisoprolol (ZEBETA) 5 MG tablet TAKE 1/2 TABLETS (2.5 MG TOTAL) BY MOUTH DAILY. 45 tablet 3   Blood Glucose Calibration (ONETOUCH VERIO) SOLN Use to calibrate one touch verio level 3 1 each 3   calcitRIOL (ROCALTROL) 0.25 MCG capsule Take 0.25 mcg by mouth daily.      dextromethorphan-guaiFENesin (MUCINEX DM) 30-600 MG 12hr tablet Take 1 tablet by mouth 2 (two) times daily.     empagliflozin (JARDIANCE) 10 MG TABS tablet Take 1 tablet (10 mg total) by mouth daily before breakfast. 30 tablet 6   fexofenadine (ALLEGRA) 180 MG tablet Take 90 mg by mouth daily.      fluticasone furoate-vilanterol (BREO ELLIPTA) 100-25 MCG/ACT AEPB 1 puff daily.     Fluticasone-Umeclidin-Vilant (TRELEGY ELLIPTA) 100-62.5-25 MCG/INH AEPB Inhale 1 puff into the lungs daily. 1 each 6   glucose blood (ONETOUCH VERIO) test strip Use to check blood sugar once a day. 100 strip 12   insulin degludec (TRESIBA  FLEXTOUCH) 200 UNIT/ML FlexTouch Pen Inject 28 Units into the skin daily. (Patient taking differently: Inject 16 Units into the skin daily.)     Insulin Pen Needle 31G X 4 MM MISC 1 each by Does not apply route daily. Use to inject insulin daily 100 each 2   KLOR-CON M10 10  MEQ tablet Take 20 mEq by mouth 2 (two) times daily.     Omega-3 Fatty Acids (FISH OIL MAXIMUM STRENGTH PO) Take 1 capsule by mouth in the morning and at bedtime.      OZEMPIC, 1 MG/DOSE, 4 MG/3ML SOPN INJECT 1 MG INTO THE SKIN ONCE A WEEK 9 mL 0   pantoprazole (PROTONIX) 40 MG tablet Take 40 mg by mouth at bedtime.      spironolactone (ALDACTONE) 25 MG tablet Take 0.5 tablets (12.5 mg total) by mouth daily. 15 tablet 6   torsemide (DEMADEX) 20 MG tablet Take 2 tablets (40 mg total) by mouth in the morning AND 1 tablet (20 mg total) every evening. RESUME ONLY ON 04/07/2021. (Patient taking differently: Take 1 tablets (20 mg total) by mouth in the morning AND 1 tablet (20 mg total) every evening. CHANGED DUE TO BP ISSUES) 360 tablet 3   No current facility-administered medications for this visit.    PHYSICAL EXAMINATION: ECOG PERFORMANCE STATUS: {CHL ONC ECOG [id number]  LABORATORY DATA:  I have reviewed the data as listed CMP Latest Ref Rng & Units 04/20/2021 04/03/2021 04/02/2021  Glucose 70 - 99 mg/dL - 170(H) 160(H)  BUN 4 - 21 55(A) 40(H) 49(H)  Creatinine 0.6 - 1.3 2.3(A) 2.07(H) 2.47(H)  Sodium 137 - 147 138 140 135  Potassium 3.4 - 5.3 4.7 4.7 4.8  Chloride 99 - 108 104 109 105  CO2 13 - 22 24(A) 23 23  Calcium 8.7 - 10.7 9.6 9.2 8.7(L)  Total Protein 6.5 - 8.1 g/dL - - -  Total Bilirubin 0.3 - 1.2 mg/dL - - -  Alkaline Phos 25 - 125 220(A) - -  AST 14 - 40 33 - -  ALT 10 - 40 35 - -    Lab Results  Component Value Date   WBC 9.5 04/20/2021   HGB 13.8 04/20/2021   HCT 42 04/20/2021   MCV 92.0 04/02/2021   PLT 210 04/20/2021   NEUTROABS 8.08 04/20/2021     RADIOGRAPHIC STUDIES: I have personally reviewed the radiological images as listed and agreed with the findings in the report. DG Foot 2 Views Left  Result Date: 04/01/2021 CLINICAL DATA:  Redness and pain at dorsal surface of LEFT foot, no known injury EXAM: LEFT FOOT - 2 VIEW COMPARISON:  None FINDINGS:  Osseous mineralization slightly decreased. Degenerative changes first MTP joint with joint space narrowing and spur formation. Remaining joint spaces preserved. No acute fracture, dislocation, or bone destruction. Tiny plantar calcaneal spur. Soft tissue swelling at dorsum of LEFT foot overlying the metatarsals. No radiopaque foreign bodies or soft tissue gas. IMPRESSION: Soft tissue swelling without acute osseous abnormalities. Degenerative changes LEFT first MCP joint. Electronically Signed   By: Lavonia Dana M.D.   On: 04/01/2021 12:52   DG C-Arm 1-60 Min-No Report  Result Date: 03/31/2021 Fluoroscopy was utilized by the requesting physician.  No radiographic interpretation.   CT Renal Stone Study  Result Date: 03/29/2021 CLINICAL DATA:  Intermittent left flank pain x2 weeks EXAM: CT ABDOMEN AND PELVIS WITHOUT CONTRAST TECHNIQUE: Multidetector CT imaging of the abdomen and pelvis was performed following the standard protocol without IV  contrast. COMPARISON:  08/11/2014 FINDINGS: Lower chest: Lung bases are clear. Hepatobiliary: Unenhanced liver is unremarkable. Gallbladder is unremarkable. No intrahepatic or extrahepatic duct dilatation. Pancreas: Within normal limits. Spleen: Within normal limits. Adrenals/Urinary Tract: Mild low-density thickening of the left adrenal gland (series 2/image 25), likely reflecting a benign adrenal adenoma. Right adrenal gland is within normal limits. Bilateral nonobstructing renal calculi measuring 3 mm right lower pole and 2 mm in the left lower pole (series 2/image 41). No hydronephrosis. Bilateral renal cysts, measuring up to 2.0 cm in the lateral left upper kidney (series 2/image 32). 12 mm hemorrhagic cyst in the anterior left lower kidney (series 2/image 40). 2.3 cm endophytic polypoid lesion in the left lower pole (series 2/image 39), poorly evaluated on unenhanced CT, but neoplasm including collecting system TCC is not excluded. Bladder is within normal limits.  Stomach/Bowel: Stomach is notable for a tiny hiatal hernia. No evidence of bowel obstruction. Normal appendix (series 2/image 55). No colonic wall thickening or inflammatory changes. 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 Atherosclerotic calcifications of the abdominal aorta and branch vessels. Small para-aortic lymph nodes, including a 10 mm short axis left para-aortic node (series 2/image 38), new. Reproductive: Prostate is diminutive. Other: No abdominopelvic ascites. Small fat containing left paramidline infraumbilical ventral hernia containing a knuckle of sigmoid colon (series 2/image 64). Tiny fat containing periumbilical hernia. Mild laxity of the midline supraumbilical anterior abdominal wall (series 2/image 52). 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 IMPRESSION: 2.3 cm endophytic polypoid lesion in the left lower kidney, poorly evaluated on unenhanced CT, neoplasm such as collecting system TCC not excluded. Consider CT abdomen with/without contrast for further evaluation. Bilateral nonobstructing lower pole renal calculi measuring 2-3 mm. No hydronephrosis. Small para-nodes measuring up to 10 mm short axis, new. Attention on follow-up is suggested. 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 Electronically Signed   By: Julian Hy M.D.   On: 03/29/2021 23:28   VAS US CAROTID  Result Date: 03/22/2021 Carotid Arterial Duplex Study Patient Name:  [name]  Date of Exam:   03/22/2021 Medical Rec #: [id number]        Accession #:    [id number] Date of Birth: 26-Mar-1941        Patient Gender: M Patient Age:   2 years Exam Location:  [facility] Procedure:      VAS US CAROTID Referring Phys: Loralie Champagne  --------------------------------------------------------------------------------  Indications:   Bilateral carotid artery stenosis [I65.23 (ICD-10-CM)]. Risk Factors:  Diabetes, coronary artery disease, PAD. Other Factors: History of aortic valve replacement with bioprosthetic valve, s/p                CABG, Ischemic cardiomyopathy. Performing Technologist: Luane School RDCS  Examination Guidelines: A complete evaluation includes B-mode imaging, spectral Doppler, color Doppler, and power Doppler as needed of all accessible portions of each vessel. Bilateral testing is considered an integral part of a complete examination. Limited examinations for reoccurring indications may be performed as noted.  Right Carotid Findings: +----------+--------+--------+--------+------------------+--------+           PSV cm/sEDV cm/sStenosisPlaque DescriptionComments +----------+--------+--------+--------+------------------+--------+ CCA Prox  45      16                                         +----------+--------+--------+--------+------------------+--------+  CCA Distal42      16                                         +----------+--------+--------+--------+------------------+--------+ ICA Prox  41      14      1-39%   calcific                   +----------+--------+--------+--------+------------------+--------+ ICA Mid   45      14                                         +----------+--------+--------+--------+------------------+--------+ ICA Distal47      20                                         +----------+--------+--------+--------+------------------+--------+ ECA       50      12                                         +----------+--------+--------+--------+------------------+--------+ +----------+--------+-------+----------------+-------------------+           PSV cm/sEDV cmsDescribe        Arm Pressure (mmHG) +----------+--------+-------+----------------+-------------------+  [id number]             Multiphasic, WNL                    +----------+--------+-------+----------------+-------------------+ +---------+--------+--+--------+-+---------+ VertebralPSV cm/s34EDV cm/s9Antegrade +---------+--------+--+--------+-+---------+  Left Carotid Findings: +----------+--------+--------+--------+------------------+--------+           PSV cm/sEDV cm/sStenosisPlaque DescriptionComments +----------+--------+--------+--------+------------------+--------+ CCA Prox  56      20                                         +----------+--------+--------+--------+------------------+--------+ CCA Distal46      16                                         +----------+--------+--------+--------+------------------+--------+ ICA Prox  107     42      40-59%  calcific                   +----------+--------+--------+--------+------------------+--------+ ICA Mid   69      20                                         +----------+--------+--------+--------+------------------+--------+ ICA Distal52      20                                         +----------+--------+--------+--------+------------------+--------+ ECA       58      16                                         +----------+--------+--------+--------+------------------+--------+ +----------+--------+--------+----------------+-------------------+  PSV cm/sEDV cm/sDescribe        Arm Pressure (mmHG) +----------+--------+--------+----------------+-------------------+ [id number]              Multiphasic, WNL                    +----------+--------+--------+----------------+-------------------+ +---------+--------+--+--------+--+ VertebralPSV cm/s32EDV cm/s14 +---------+--------+--+--------+--+   Summary: Right Carotid: Velocities in the right ICA are consistent with a 1-39% stenosis. Left Carotid: Velocities in the left ICA are consistent with a 40-59% stenosis. Vertebrals:   Bilateral vertebral arteries demonstrate antegrade flow. Subclavians: Normal flow hemodynamics were seen in bilateral subclavian              arteries. *See table(s) above for measurements and observations.  Electronically signed by Jenne Campus MD on 03/22/2021 at 4:52:17 PM.    Final     I discussed the assessment and treatment plan with the patient. The patient was provided an opportunity to ask questions and all were answered. The patient agreed with the plan and demonstrated an understanding of the instructions. The patient was advised to call back or seek an in-person evaluation if the symptoms worsen or if the condition fails to improve as anticipated.    I spent {CHL ONC TIME VISIT - OXBDZ:[id number]} for the appointment reviewing test results, discuss management and coordination of care.  [name], MD 04/20/2021 7:11 PM

## 2021-04-20 NOTE — Progress Notes (Deleted)
HEMATOLOGY-ONCOLOGY ELECTRONIC VISIT PROGRESS NOTE  Patient Care Team: [name], MD as PCP - General (Internal Medicine) Larey Dresser, MD as Attending Physician (Cardiology)  I connected with the patient via telephone conference and verified that I am speaking with the correct person using two identifiers. The patient's location is at home and I am providing care from the [facility] I discussed the limitations, risks, security and privacy concerns of performing an evaluation and management service by e-visits and the availability of in person appointments.  I also discussed with the patient that there may be a patient responsible charge related to this service. The patient expressed understanding and agreed to proceed.   ASSESSMENT & PLAN:  No problem-specific Assessment & Plan notes found for this encounter.   No orders of the defined types were placed in this encounter.   INTERVAL HISTORY: Please see below for problem oriented charting. The purpose of today's discussion is ***   SUMMARY OF ONCOLOGIC HISTORY: Oncology History   No history exists.    REVIEW OF SYSTEMS:   Constitutional: Denies fevers, chills or abnormal weight loss Eyes: Denies blurriness of vision Ears, nose, mouth, throat, and face: Denies mucositis or sore throat Respiratory: Denies cough, dyspnea or wheezes Cardiovascular: Denies palpitation, chest discomfort Gastrointestinal:  Denies nausea, heartburn or change in bowel habits Skin: Denies abnormal skin rashes Lymphatics: Denies new lymphadenopathy or easy bruising Neurological:Denies numbness, tingling or new weaknesses Behavioral/Psych: Mood is stable, no new changes  Extremities: No lower extremity edema All other systems were reviewed with the patient and are negative.  I have reviewed the past medical history, past surgical history, social history and family history with the patient and they are unchanged from previous  note.  ALLERGIES:  is allergic to iodinated diagnostic agents, omnipaque [iohexol], primaxin [imipenem], cephalosporins, lisinopril, and clindamycin/lincomycin.  MEDICATIONS:  Current Outpatient Medications  Medication Sig Dispense Refill   acetaminophen (TYLENOL) 500 MG tablet Take 1 tablet (500 mg total) by mouth every 6 (six) hours as needed. 30 tablet 0   albuterol (VENTOLIN HFA) 108 (90 Base) MCG/ACT inhaler Inhale 2 puffs into the lungs every 6 (six) hours as needed for wheezing or shortness of breath. 8 g 2   allopurinol (ZYLOPRIM) 300 MG tablet Take 300 mg by mouth daily.     amoxicillin (AMOXIL) 500 MG capsule TAKE 4 CAPS 1 HOUR PRIOR TO TREATMENT     aspirin EC 81 MG tablet Take 81 mg by mouth daily.     atorvastatin (LIPITOR) 80 MG tablet TAKE 1 TABLET (80 MG TOTAL) BY MOUTH DAILY BEFORE BREAKFAST. 90 tablet 3   bisoprolol (ZEBETA) 5 MG tablet TAKE 1/2 TABLETS (2.5 MG TOTAL) BY MOUTH DAILY. 45 tablet 3   Blood Glucose Calibration (ONETOUCH VERIO) SOLN Use to calibrate one touch verio level 3 1 each 3   calcitRIOL (ROCALTROL) 0.25 MCG capsule Take 0.25 mcg by mouth daily.      dextromethorphan-guaiFENesin (MUCINEX DM) 30-600 MG 12hr tablet Take 1 tablet by mouth 2 (two) times daily.     empagliflozin (JARDIANCE) 10 MG TABS tablet Take 1 tablet (10 mg total) by mouth daily before breakfast. 30 tablet 6   fexofenadine (ALLEGRA) 180 MG tablet Take 90 mg by mouth daily.      Fluticasone-Umeclidin-Vilant (TRELEGY ELLIPTA) 100-62.5-25 MCG/INH AEPB Inhale 1 puff into the lungs daily. 1 each 6   glucose blood (ONETOUCH VERIO) test strip Use to check blood sugar once a day. 100 strip 12  insulin degludec (TRESIBA FLEXTOUCH) 200 UNIT/ML FlexTouch Pen Inject 28 Units into the skin daily. (Patient taking differently: Inject 16 Units into the skin daily.)     Insulin Pen Needle 31G X 4 MM MISC 1 each by Does not apply route daily. Use to inject insulin daily 100 each 2   Omega-3 Fatty Acids  (FISH OIL MAXIMUM STRENGTH PO) Take 1 capsule by mouth in the morning and at bedtime.      OZEMPIC, 1 MG/DOSE, 4 MG/3ML SOPN INJECT 1 MG INTO THE SKIN ONCE A WEEK 9 mL 0   pantoprazole (PROTONIX) 40 MG tablet Take 40 mg by mouth at bedtime.      Potassium Chloride (KLOR-CON 10 PO) Take 2 tablets by mouth daily in the afternoon.     SITagliptin Phosphate (JANUVIA PO) Take 10 mg by mouth.     spironolactone (ALDACTONE) 25 MG tablet Take 0.5 tablets (12.5 mg total) by mouth daily. 15 tablet 6   torsemide (DEMADEX) 20 MG tablet Take 2 tablets (40 mg total) by mouth in the morning AND 1 tablet (20 mg total) every evening. RESUME ONLY ON 04/07/2021. 360 tablet 3   No current facility-administered medications for this visit.    PHYSICAL EXAMINATION: ECOG PERFORMANCE STATUS: {CHL ONC ECOG [id number]  LABORATORY DATA:  I have reviewed the data as listed CMP Latest Ref Rng & Units 04/03/2021 04/02/2021 04/01/2021  Glucose 70 - 99 mg/dL 170(H) 160(H) 99  BUN 8 - 23 mg/dL 40(H) 49(H) 52(H)  Creatinine 0.61 - 1.24 mg/dL 2.07(H) 2.47(H) 2.34(H)  Sodium 135 - 145 mmol/L 140 135 135  Potassium 3.5 - 5.1 mmol/L 4.7 4.8 5.1  Chloride 98 - 111 mmol/L 109 105 104  CO2 22 - 32 mmol/L 23 23 24   Calcium 8.9 - 10.3 mg/dL 9.2 8.7(L) 9.5  Total Protein 6.5 - 8.1 g/dL - - -  Total Bilirubin 0.3 - 1.2 mg/dL - - -  Alkaline Phos 38 - 126 U/L - - -  AST 15 - 41 U/L - - -  ALT 0 - 44 U/L - - -    Lab Results  Component Value Date   WBC 7.8 04/02/2021   HGB 13.5 04/02/2021   HCT 45.0 04/02/2021   MCV 92.0 04/02/2021   PLT 126 (L) 04/02/2021   NEUTROABS 5.4 03/27/2019     RADIOGRAPHIC STUDIES: I have personally reviewed the radiological images as listed and agreed with the findings in the report. DG Foot 2 Views Left  Result Date: 04/01/2021 CLINICAL DATA:  Redness and pain at dorsal surface of LEFT foot, no known injury EXAM: LEFT FOOT - 2 VIEW COMPARISON:  None FINDINGS: Osseous mineralization  slightly decreased. Degenerative changes first MTP joint with joint space narrowing and spur formation. Remaining joint spaces preserved. No acute fracture, dislocation, or bone destruction. Tiny plantar calcaneal spur. Soft tissue swelling at dorsum of LEFT foot overlying the metatarsals. No radiopaque foreign bodies or soft tissue gas. IMPRESSION: Soft tissue swelling without acute osseous abnormalities. Degenerative changes LEFT first MCP joint. Electronically Signed   By: Lavonia Dana M.D.   On: 04/01/2021 12:52   DG C-Arm 1-60 Min-No Report  Result Date: 03/31/2021 Fluoroscopy was utilized by the requesting physician.  No radiographic interpretation.   CT Renal Stone Study  Result Date: 03/29/2021 CLINICAL DATA:  Intermittent left flank pain x2 weeks EXAM: CT ABDOMEN AND PELVIS WITHOUT CONTRAST TECHNIQUE: Multidetector CT imaging of the abdomen and pelvis was performed following the standard protocol without IV  contrast. COMPARISON:  08/11/2014 FINDINGS: Lower chest: Lung bases are clear. Hepatobiliary: Unenhanced liver is unremarkable. Gallbladder is unremarkable. No intrahepatic or extrahepatic duct dilatation. Pancreas: Within normal limits. Spleen: Within normal limits. Adrenals/Urinary Tract: Mild low-density thickening of the left adrenal gland (series 2/image 25), likely reflecting a benign adrenal adenoma. Right adrenal gland is within normal limits. Bilateral nonobstructing renal calculi measuring 3 mm right lower pole and 2 mm in the left lower pole (series 2/image 41). No hydronephrosis. Bilateral renal cysts, measuring up to 2.0 cm in the lateral left upper kidney (series 2/image 32). 12 mm hemorrhagic cyst in the anterior left lower kidney (series 2/image 40). 2.3 cm endophytic polypoid lesion in the left lower pole (series 2/image 39), poorly evaluated on unenhanced CT, but neoplasm including collecting system TCC is not excluded. Bladder is within normal limits. Stomach/Bowel: Stomach is  notable for a tiny hiatal hernia. No evidence of bowel obstruction. Normal appendix (series 2/image 55). No colonic wall thickening or inflammatory changes. 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 Atherosclerotic calcifications of the abdominal aorta and branch vessels. Small para-aortic lymph nodes, including a 10 mm short axis left para-aortic node (series 2/image 38), new. Reproductive: Prostate is diminutive. Other: No abdominopelvic ascites. Small fat containing left paramidline infraumbilical ventral hernia containing a knuckle of sigmoid colon (series 2/image 64). Tiny fat containing periumbilical hernia. Mild laxity of the midline supraumbilical anterior abdominal wall (series 2/image 52). 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 IMPRESSION: 2.3 cm endophytic polypoid lesion in the left lower kidney, poorly evaluated on unenhanced CT, neoplasm such as collecting system TCC not excluded. Consider CT abdomen with/without contrast for further evaluation. Bilateral nonobstructing lower pole renal calculi measuring 2-3 mm. No hydronephrosis. Small para-nodes measuring up to 10 mm short axis, new. Attention on follow-up is suggested. 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 Electronically Signed   By: Julian Hy M.D.   On: 03/29/2021 23:28   VAS US CAROTID  Result Date: 03/22/2021 Carotid Arterial Duplex Study Patient Name:  [name]  Date of Exam:   03/22/2021 Medical Rec #: [id number]        Accession #:    [id number] Date of Birth: 06/18/1941        Patient Gender: M Patient Age:   80 years Exam Location:  [place] Procedure:      VAS US CAROTID Referring Phys: Loralie Champagne  --------------------------------------------------------------------------------  Indications:   Bilateral carotid artery stenosis [I65.23 (ICD-10-CM)]. Risk Factors:  Diabetes, coronary artery disease, PAD. Other Factors: History of aortic valve replacement with bioprosthetic valve, s/p                CABG, Ischemic cardiomyopathy. Performing Technologist: Luane School RDCS  Examination Guidelines: A complete evaluation includes B-mode imaging, spectral Doppler, color Doppler, and power Doppler as needed of all accessible portions of each vessel. Bilateral testing is considered an integral part of a complete examination. Limited examinations for reoccurring indications may be performed as noted.  Right Carotid Findings: +----------+--------+--------+--------+------------------+--------+           PSV cm/sEDV cm/sStenosisPlaque DescriptionComments +----------+--------+--------+--------+------------------+--------+ CCA Prox  45      16                                         +----------+--------+--------+--------+------------------+--------+  CCA Distal42      16                                         +----------+--------+--------+--------+------------------+--------+ ICA Prox  41      14      1-39%   calcific                   +----------+--------+--------+--------+------------------+--------+ ICA Mid   45      14                                         +----------+--------+--------+--------+------------------+--------+ ICA Distal47      20                                         +----------+--------+--------+--------+------------------+--------+ ECA       50      12                                         +----------+--------+--------+--------+------------------+--------+ +----------+--------+-------+----------------+-------------------+           PSV cm/sEDV cmsDescribe        Arm Pressure (mmHG) +----------+--------+-------+----------------+-------------------+  [id number]             Multiphasic, WNL                    +----------+--------+-------+----------------+-------------------+ +---------+--------+--+--------+-+---------+ VertebralPSV cm/s34EDV cm/s9Antegrade +---------+--------+--+--------+-+---------+  Left Carotid Findings: +----------+--------+--------+--------+------------------+--------+           PSV cm/sEDV cm/sStenosisPlaque DescriptionComments +----------+--------+--------+--------+------------------+--------+ CCA Prox  56      20                                         +----------+--------+--------+--------+------------------+--------+ CCA Distal46      16                                         +----------+--------+--------+--------+------------------+--------+ ICA Prox  107     42      40-59%  calcific                   +----------+--------+--------+--------+------------------+--------+ ICA Mid   69      20                                         +----------+--------+--------+--------+------------------+--------+ ICA Distal52      20                                         +----------+--------+--------+--------+------------------+--------+ ECA       58      16                                         +----------+--------+--------+--------+------------------+--------+ +----------+--------+--------+----------------+-------------------+  PSV cm/sEDV cm/sDescribe        Arm Pressure (mmHG) +----------+--------+--------+----------------+-------------------+ [id number]              Multiphasic, WNL                    +----------+--------+--------+----------------+-------------------+ +---------+--------+--+--------+--+ VertebralPSV cm/s32EDV cm/s14 +---------+--------+--+--------+--+   Summary: Right Carotid: Velocities in the right ICA are consistent with a 1-39% stenosis. Left Carotid: Velocities in the left ICA are consistent with a 40-59% stenosis. Vertebrals:   Bilateral vertebral arteries demonstrate antegrade flow. Subclavians: Normal flow hemodynamics were seen in bilateral subclavian              arteries. *See table(s) above for measurements and observations.  Electronically signed by Jenne Campus MD on 03/22/2021 at 4:52:17 PM.    Final     I discussed the assessment and treatment plan with the patient. The patient was provided an opportunity to ask questions and all were answered. The patient agreed with the plan and demonstrated an understanding of the instructions. The patient was advised to call back or seek an in-person evaluation if the symptoms worsen or if the condition fails to improve as anticipated.    I spent {CHL ONC TIME VISIT - QHUTM:[id number]} for the appointment reviewing test results, discuss management and coordination of care.  [name], MD 04/20/2021 1:32 PM

## 2021-04-21 DIAGNOSIS — C7951 Secondary malignant neoplasm of bone: Secondary | ICD-10-CM | POA: Diagnosis not present

## 2021-04-25 ENCOUNTER — Other Ambulatory Visit: Payer: Self-pay | Admitting: Oncology

## 2021-04-25 DIAGNOSIS — D49519 Neoplasm of unspecified behavior of unspecified kidney: Secondary | ICD-10-CM

## 2021-04-25 DIAGNOSIS — C7951 Secondary malignant neoplasm of bone: Secondary | ICD-10-CM

## 2021-04-25 DIAGNOSIS — G893 Neoplasm related pain (acute) (chronic): Secondary | ICD-10-CM

## 2021-04-25 DIAGNOSIS — N184 Chronic kidney disease, stage 4 (severe): Secondary | ICD-10-CM

## 2021-04-25 LAB — PROTEIN ELECTROPHORESIS, SERUM
A/G Ratio: 0.8 (ref 0.7–1.7)
Albumin ELP: 3 g/dL (ref 2.9–4.4)
Alpha-1-Globulin: 0.4 g/dL (ref 0.0–0.4)
Alpha-2-Globulin: 1.3 g/dL — ABNORMAL HIGH (ref 0.4–1.0)
Beta Globulin: 0.9 g/dL (ref 0.7–1.3)
Gamma Globulin: 1.2 g/dL (ref 0.4–1.8)
Globulin, Total: 3.8 g/dL (ref 2.2–3.9)
Total Protein ELP: 6.8 g/dL (ref 6.0–8.5)

## 2021-05-02 ENCOUNTER — Encounter: Payer: Self-pay | Admitting: Oncology

## 2021-05-05 ENCOUNTER — Other Ambulatory Visit: Payer: Self-pay | Admitting: Hematology and Oncology

## 2021-05-05 DIAGNOSIS — C7951 Secondary malignant neoplasm of bone: Secondary | ICD-10-CM

## 2021-05-06 ENCOUNTER — Ambulatory Visit (INDEPENDENT_AMBULATORY_CARE_PROVIDER_SITE_OTHER): Payer: Medicare Other

## 2021-05-06 DIAGNOSIS — I442 Atrioventricular block, complete: Secondary | ICD-10-CM | POA: Diagnosis not present

## 2021-05-06 LAB — CUP PACEART REMOTE DEVICE CHECK
Battery Remaining Longevity: 22 mo
Battery Remaining Percentage: 23 %
Battery Voltage: 2.89 V
Brady Statistic AP VP Percent: 1 %
Brady Statistic AP VS Percent: 1 %
Brady Statistic AS VP Percent: 97 %
Brady Statistic AS VS Percent: 1.2 %
Brady Statistic RA Percent Paced: 1 %
Date Time Interrogation Session: 20221104025849
Implantable Lead Implant Date: 20160113
Implantable Lead Implant Date: 20160113
Implantable Lead Implant Date: 20170322
Implantable Lead Location: 753858
Implantable Lead Location: 753859
Implantable Lead Location: 753860
Implantable Lead Model: 5076
Implantable Lead Model: 5076
Implantable Pulse Generator Implant Date: 20170322
Lead Channel Impedance Value: 400 Ohm
Lead Channel Impedance Value: 540 Ohm
Lead Channel Impedance Value: 680 Ohm
Lead Channel Pacing Threshold Amplitude: 0.5 V
Lead Channel Pacing Threshold Amplitude: 0.5 V
Lead Channel Pacing Threshold Amplitude: 1 V
Lead Channel Pacing Threshold Pulse Width: 0.4 ms
Lead Channel Pacing Threshold Pulse Width: 0.4 ms
Lead Channel Pacing Threshold Pulse Width: 0.4 ms
Lead Channel Sensing Intrinsic Amplitude: 12 mV
Lead Channel Sensing Intrinsic Amplitude: 3.1 mV
Lead Channel Setting Pacing Amplitude: 1.5 V
Lead Channel Setting Pacing Amplitude: 2 V
Lead Channel Setting Pacing Amplitude: 2.5 V
Lead Channel Setting Pacing Pulse Width: 0.4 ms
Lead Channel Setting Pacing Pulse Width: 0.4 ms
Lead Channel Setting Sensing Sensitivity: 5 mV
Pulse Gen Model: 3262
Pulse Gen Serial Number: 7838612

## 2021-05-09 ENCOUNTER — Other Ambulatory Visit: Payer: Self-pay | Admitting: Internal Medicine

## 2021-05-10 ENCOUNTER — Other Ambulatory Visit (HOSPITAL_COMMUNITY): Payer: Self-pay

## 2021-05-10 ENCOUNTER — Telehealth: Payer: Self-pay | Admitting: Pharmacy Technician

## 2021-05-10 NOTE — Telephone Encounter (Signed)
Patient Advocate Encounter   Received notification from [facility] that prior authorization for Ozempic is required by his/her insurance [employer]/ [employer].  Per Test Claim: No PA needed. Copay is $ 145.90 for a 3 month supply.  $48.65 for 1 month.   Armanda Magic, CPhT [facility] Phone: [phone] Fax:  [phone]

## 2021-05-10 NOTE — Telephone Encounter (Signed)
Called and advised the pharmacy of test claim results. Pharmacy stated they may be running the medication wrong and will follow up with the pt's insurance.

## 2021-05-10 NOTE — Telephone Encounter (Signed)
-----   Message from Lauralyn Primes, [place] sent at 05/09/2021  3:32 PM EST ----- Regarding: PA Can we get a PA for Ozempic initiated. Thank you.  Stay safe Tileshia R RMA

## 2021-05-11 NOTE — Progress Notes (Signed)
Remote pacemaker transmission.   

## 2021-05-12 ENCOUNTER — Telehealth: Payer: Self-pay | Admitting: Oncology

## 2021-05-12 ENCOUNTER — Ambulatory Visit: Payer: Medicare Other | Admitting: Oncology

## 2021-05-12 NOTE — Telephone Encounter (Signed)
Per 11/10 Staff Msg, patient scheduled for 11/11 [place] Up 11 am

## 2021-05-13 ENCOUNTER — Ambulatory Visit: Payer: Medicare Other | Admitting: Oncology

## 2021-05-13 ENCOUNTER — Telehealth: Payer: Self-pay | Admitting: Hematology and Oncology

## 2021-05-13 NOTE — Telephone Encounter (Signed)
Per 05/13/21 spoke with wife and scheduled biopsy on 05/19/21@930am 

## 2021-05-17 DIAGNOSIS — I361 Nonrheumatic tricuspid (valve) insufficiency: Secondary | ICD-10-CM

## 2021-05-18 NOTE — Progress Notes (Addendum)
ADDENDUM: I now have the official pathology report of the L1 bone biopsy done 11/17.  This shows metastatic carcinoma, most consistent with adenocarcinoma but primary is unknown.  The stains are not compatible with prostate origin.  There is insufficient tissue to determine further and I do not feel there is benefit to getting more tissue in view of the low yield, morbidity and his poor performance status.    [facility]  [address] [place],  [place]  [address] [phone]  Clinic Day:  05/25/2021  Referring physician: [name], MD  This document serves as a record of services personally performed by [name], MD. It was created on their behalf by [name],[name], a trained medical scribe. The creation of this record is based on the scribe's personal observations and the provider's statements to them.  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 SPEP was negative for monoclonal spike. I also recommend that we obtain PET imaging as he will likely require biopsy and this could assist in targeting the most assessable area. Bone biopsy report from November 17th is still pending, however, stains are suggestive of adenocarcinoma. We have drawn liquid Guardant testing to determine if he qualifies for any targeted therapy.   Former smoker, quit in 2014. He has been getting annual CT for lung cancer screening, most recent July 2021, with benign findings.  Chronic kidney disease, mildly improved. He has been following with a nephrologist.  Testicular pain. This has persisted for the past several months, and is likely referred pain from the lower spine.  Hypotension, severe and associated with dehydration. We will administer 1 L of IV fluids today.   SPEP was negative for monoclonal spike. PET scan revealed  multifocal bony metastases mainly sclerotic with mild to moderate increased metabolic activity suspicious for or prostate cancer metastases based on appearance and history. Left retroperitoneal soft tissue is similar to the recent CT displaying a nodular appearance and without substantial uptake. Metastatic process is considered in addition to post inflammatory changes in evolution or retroperitoneal fibrosis. Pathology from the bone biopsy is still pending, but stains are suggestive of adenocarcinoma of unknown primary. I explained that this is stage IV, and so would not be curative. We will obtain liquid Guardant to determine if he qualifies for any targeted therapy. However, we need to keep in mind that he has a performance status of 2 and so is likely not able to tolerate an aggressive treatment approach. In view of his hypotension and dehydration, we will administer 1 L of IV fluids in clinic today. We will plan to see him back in 2 weeks with CBC and CMP for repeat evaluation. He and his family understand and agree with this plan of care. I have answered their questions and they know to call with any concerns.  I provided 30 minutes of face-to-face time during this this encounter and > 50% was spent counseling as documented under my assessment and plan.    [name], MD [place] [address] [place] [address] Dept: [phone] Dept Fax: [phone]    CHIEF COMPLAINT:  CC: Sclerotic osseous metastases  Current Treatment:  Diagnostics   HISTORY OF PRESENT ILLNESS:  [name] is a 80 y.o. male referred for the evaluation and treatment of bone  lesions. This began when the patient presented to the [facility] emergency department on September 27th due to left sided flank pain, altered mental status and lethargy. He also had been experiencing pain in his testicles for several weeks. He was also found to  have an elevated creatinine of 3.45.  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 A 2.3 cm endophytic polypoid lesion was also observed in the left lower kidney, as well as small para-aortic lymph nodes measuring up to 10 mm, new. Diagnostic ureteroscopy was performed and no obvious tumor or other abnormalities were seen. There was evidence of chronic inflammation and possibly a small abscess pocket. Cytology was negative. Biopsy of the left renal pelvis from September 29th was consistent with predominant blood clot and inflammatory cells. A stent was placed and the patient felt significantly better. His kidney function also improved back to baseline of 2.0, and the patient was discharged. Digital rectal exam was negative. Of note, the patient has a history of transurethral microwave of the prostate for BPH. Despite his records, according to the patient, he has never been diagnosed with prostate cancer. His PSA was 0.19 in the hospital.  He was undergoing routine lung cancer screening CT annually, but has not had one this year. Most recent from July 2021 revealed benign findings.   INTERVAL HISTORY:  I have reviewed his chart and materials related to his cancer extensively and collaborated history with the patient and his daughters. Summary of oncologic history is as follows:  Oncology History   No history exists.    Child is here for routine follow up and feels poorly and is spending at least half the day in bed. He continues to have testicular pain, and also notes arthralgias. He also notes fatigue. He was in the hospital recently due to dehydration, hypotension and UTI. We were able to get the biopsy performed while he was in the hospital. PET imaging from October 31st revealed multifocal bony metastases mainly sclerotic with mild to moderate increased metabolic activity. Left retroperitoneal soft  tissue is similar to the recent CT displaying a nodular appearance and without substantial uptake. Metastatic process is considered in addition to post inflammatory changes in evolution or retroperitoneal fibrosis. Periadrenal stranding on the left. He underwent bone biopsy on November 17th but the official pathology report is still pending. However, I have spoken with the pathologist who does state that this is malignant and suggestive of adenocarcinoma. The CK7 stain was positive. The stains for prostate cancer were negative. The stains for Napsin A and TTF-1 were negative. The stains for PAX8 were equivocal.  We therefore are left with an unknown primary. Blood counts and chemistries are unremarkable except for a BUN of 55, worse, and a creatinine of 2.2, previously 2.4. Blood glucose is 212. His  appetite is poor, and he has lost 10 pounds since his last visit.  He denies fever, chills or other signs of infection.  He denies nausea, vomiting, bowel issues, or abdominal pain.  He denies sore throat, cough, dyspnea, or chest pain.  HISTORY:   Allergies:  Allergies  Allergen Reactions   Iodinated Diagnostic Agents Other (See Comments)    Decreased kidney function   Omnipaque [Iohexol] Other (See Comments)    Decreased kidney function   Primaxin [Imipenem] Hives   Cephalosporins Rash    Blisters    Lisinopril Nausea And Vomiting    Drops blood  pressure    Clindamycin/Lincomycin Swelling and Rash    Current Medications: Current Outpatient Medications  Medication Sig Dispense Refill   acetaminophen (TYLENOL) 500 MG tablet Take 1 tablet (500 mg total) by mouth every 6 (six) hours as needed. 30 tablet 0   albuterol (VENTOLIN HFA) 108 (90 Base) MCG/ACT inhaler Inhale 2 puffs into the lungs every 6 (six) hours as needed for wheezing or shortness of breath. 8 g 2   allopurinol (ZYLOPRIM) 300 MG tablet Take 300 mg by mouth daily.     aspirin EC 81 MG tablet Take 81 mg by mouth daily.      atorvastatin (LIPITOR) 80 MG tablet TAKE 1 TABLET (80 MG TOTAL) BY MOUTH DAILY BEFORE BREAKFAST. 90 tablet 3   Blood Glucose Calibration (ONETOUCH VERIO) SOLN Use to calibrate one touch verio level 3 1 each 3   calcitRIOL (ROCALTROL) 0.25 MCG capsule Take 0.25 mcg by mouth daily.      dextromethorphan-guaiFENesin (MUCINEX DM) 30-600 MG 12hr tablet Take 1 tablet by mouth 2 (two) times daily.     empagliflozin (JARDIANCE) 10 MG TABS tablet Take 1 tablet (10 mg total) by mouth daily before breakfast. 30 tablet 6   fexofenadine (ALLEGRA) 180 MG tablet Take 90 mg by mouth daily.      fluticasone furoate-vilanterol (BREO ELLIPTA) 100-25 MCG/ACT AEPB 1 puff daily.     Fluticasone-Umeclidin-Vilant (TRELEGY ELLIPTA) 100-62.5-25 MCG/INH AEPB Inhale 1 puff into the lungs daily. 1 each 6   glucose blood (ONETOUCH VERIO) test strip Use to check blood sugar once a day. 100 strip 12   insulin degludec (TRESIBA FLEXTOUCH) 200 UNIT/ML FlexTouch Pen Inject 28 Units into the skin daily. (Patient taking differently: Inject 16 Units into the skin daily.)     Insulin Pen Needle 31G X 4 MM MISC 1 each by Does not apply route daily. Use to inject insulin daily 100 each 2   KLOR-CON M10 10 MEQ tablet Take 20 mEq by mouth 2 (two) times daily.     Omega-3 Fatty Acids (FISH OIL MAXIMUM STRENGTH PO) Take 1 capsule by mouth in the morning and at bedtime.      OZEMPIC, 1 MG/DOSE, 4 MG/3ML SOPN INJECT 1MG  INTO THE SKIN ONCE A WEEK 9 mL 0   pantoprazole (PROTONIX) 40 MG tablet Take 40 mg by mouth at bedtime.      torsemide (DEMADEX) 20 MG tablet Take 2 tablets (40 mg total) by mouth in the morning AND 1 tablet (20 mg total) every evening. RESUME ONLY ON 04/07/2021. (Patient taking differently: Take 1 tablets (20 mg total) by mouth in the morning AND 1 tablet (20 mg total) every evening. CHANGED DUE TO BP ISSUES) 360 tablet 3   No current facility-administered medications for this visit.    REVIEW OF SYSTEMS:  Review of Systems   Constitutional:  Positive for fatigue. Negative for appetite change, chills, fever and unexpected weight change.  HENT:  Negative.    Eyes: Negative.   Respiratory:  Negative for chest tightness, cough, hemoptysis, shortness of breath and wheezing.   Cardiovascular: Negative.  Negative for chest pain, leg swelling and palpitations.  Gastrointestinal: Negative.  Negative for abdominal distention, abdominal pain, blood in stool, constipation, diarrhea, nausea and vomiting.  Genitourinary:  Negative for difficulty urinating, dysuria, frequency and hematuria.        Testicular pain  Musculoskeletal:  Positive for arthralgias and gait problem (in a wheelchair). Negative for back pain, flank pain and myalgias.  Skin: Negative.  Neurological:  Positive for gait problem (in a wheelchair). Negative for dizziness, extremity weakness, headaches, light-headedness, numbness, seizures and speech difficulty.  Hematological: Negative.   Psychiatric/Behavioral:  Positive for confusion (occasional short term memory loss). Negative for depression and sleep disturbance. The patient is not nervous/anxious.   All other systems reviewed and are negative.   VITALS:  Blood pressure (!) 84/53, pulse (!) 118, temperature 97.8 F (36.6 C), temperature source Oral, resp. rate 18, height 5\' 5"  (1.651 m), weight 177 lb 8 oz (80.5 kg), SpO2 95 %.  Wt Readings from Last 3 Encounters:  05/25/21 177 lb 8 oz (80.5 kg)  05/24/21 178 lb 6.4 oz (80.9 kg)  04/20/21 187 lb 12.8 oz (85.2 kg)    Body mass index is 29.54 kg/m.  Performance status (ECOG): 2 - Symptomatic, <50% confined to bed  PHYSICAL EXAM:  Physical Exam Constitutional:      General: He is not in acute distress.    Appearance: Normal appearance. He is normal weight.  HENT:     Head: Normocephalic and atraumatic.  Eyes:     General: No scleral icterus.    Extraocular Movements: Extraocular movements intact.     Conjunctiva/sclera: Conjunctivae normal.      Pupils: Pupils are equal, round, and reactive to light.  Cardiovascular:     Rate and Rhythm: Regular rhythm. Tachycardia present.     Pulses: Normal pulses.     Heart sounds: Normal heart sounds. No murmur heard.   No friction rub. No gallop.  Pulmonary:     Effort: Pulmonary effort is normal. No respiratory distress.     Breath sounds: Wheezing (slight on the left) present.  Abdominal:     General: Bowel sounds are normal. There is no distension.     Palpations: Abdomen is soft. There is no hepatomegaly, splenomegaly or mass.     Tenderness: There is no abdominal tenderness.  Musculoskeletal:        General: Normal range of motion.     Cervical back: Normal range of motion and neck supple.     Right lower leg: No edema.     Left lower leg: No edema.  Lymphadenopathy:     Cervical: No cervical adenopathy.  Skin:    General: Skin is warm and dry.     Comments: Skin turgor decreased  Neurological:     General: No focal deficit present.     Mental Status: He is alert and oriented to person, place, and time. Mental status is at baseline.  Psychiatric:        Mood and Affect: Mood normal.        Behavior: Behavior normal.        Thought Content: Thought content normal.        Judgment: Judgment normal.    LABS:   CBC Latest Ref Rng & Units 05/25/2021 05/24/2021 04/20/2021  WBC - 4.5 5.9 9.5  Hemoglobin 13.5 - 17.5 12.9(A) 12.6(A) 13.8  Hematocrit 41 - 53 41 40(A) 42  Platelets 150 - 399 174 199 210   CMP Latest Ref Rng & Units 05/25/2021 05/19/2021 04/20/2021  Glucose 70 - 99 mg/dL - - -  BUN 4 - 21 55(A) 53(A) 55(A)  Creatinine 0.6 - 1.3 2.2(A) 2.4(A) 2.3(A)  Sodium 137 - 147 135(A) 138 138  Potassium 3.4 - 5.3 4.6 4.5 4.7  Chloride 99 - 108 101 105 104  CO2 13 - 22 25(A) 28(A) 24(A)  Calcium 8.7 - 10.7 9.3 9.2 9.6  Total Protein 6.5 - 8.1 g/dL - - -  Total Bilirubin 0.3 - 1.2 mg/dL - - -  Alkaline Phos 25 - 125 214(A) - 220(A)  AST 14 - 40 48(A) - 33  ALT 10 -  40 37 - 35     No results found for: CEA1 / No results found for: CEA1 No results found for: PSA1 No results found for: QIH474  Lab Results  Component Value Date   TOTALPROTELP 6.8 04/21/2021   ALBUMINELP 3.0 04/21/2021   A1GS 0.4 04/21/2021   A2GS 1.3 (H) 04/21/2021   BETS 0.9 04/21/2021   BETA2SER 5.2 01/03/2013   GAMS 1.2 04/21/2021   MSPIKE Not Observed 04/21/2021   SPEI Comment 04/21/2021   Lab Results  Component Value Date   TIBC 176 (L) 07/23/2013   TIBC 226 01/04/2013   TIBC 233 01/03/2013   FERRITIN 1,109 (H) 03/26/2019   FERRITIN 1,151 (H) 03/25/2019   FERRITIN 973 (H) 03/24/2019   IRONPCTSAT 7 (L) 07/23/2013   IRONPCTSAT 27 01/04/2013   IRONPCTSAT 24 01/03/2013   Lab Results  Component Value Date   LDH 201 07/31/2012    STUDIES:  CUP PACEART REMOTE DEVICE CHECK  Result Date: 05/06/2021 Scheduled remote reviewed. Normal device function.  Longest AMS event 12 sec. Burden <1%. BiV paced 98%. HF dx's   stable. Next remote 91 days. LH    EXAM: 05/02/2021 NUCLEAR MEDICINE PET SKULL BASE TO THIGH  TECHNIQUE: 10.35 mCi F-18 FDG was injected intravenously. Full-ring PET imaging was performed from the skull base to thigh after the radiotracer. CT data was obtained and used for attenuation correction and anatomic localization. Fasting blood glucose: 151 mg/dl  COMPARISON: Comparison made with April 13, 2021.  FINDINGS: Mediastinal blood pool activity: SUV max 2.56 Liver activity: SUV max NA NECK: No hypermetabolic lymph nodes in the neck. Incidental CT findings: none CHEST: No hypermetabolic mediastinal or hilar nodes. No suspicious pulmonary nodules on the CT scan. Incidental CT findings: Aortic atherosclerosis. 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 LEFT-sided multilead pacer device in place. Normal caliber of central pulmonary vasculature. No adenopathy by size criteria in the chest. Pulmonary emphysema as before.  ABDOMEN/PELVIS:  No abnormal hypermetabolic activity within the liver, pancreas, adrenal glands, or spleen. No hypermetabolic lymph nodes in the abdomen or pelvis. Soft tissue adjacent to LEFT renal vasculature with a maximum SUV of 2.85 (image 150/3) Incidental CT findings: No acute findings relative to liver, gallbladder, pancreas and spleen. The Increased LEFT adrenal thickening since previous imaging (image 133/3) low attenuation compatible with adrenal adenoma at without increased metabolic activity. This measures 1.4 cm. Stranding about the LEFT kidney, no focal uptake about the LEFT kidney. LEFT nephroureteral stent remains in place. Aortic atherosclerosis and signs of vascular repair as before. No acute gastrointestinal findings. SKELETON: Multifocal areas of sclerosis and lytic change with mild to moderately increased metabolic activity. Focal sclerosis in the LEFT humeral head (image 47/3) maximum SUV 3.27 Focal subtle lucency at the LEFT scapular tip (image 88/3) maximum SUV of 4.4 with subtle lucency measuring approximately 10 mm with cortical breakthrough Scattered stool parotic foci throughout the spine beginning at the T11 level. More diffuse sclerosis of LEFT T12 tracking into the pedicle (image 141/3) maximum SUV of 4.5. Sclerosis along the anterior margin of L1 maximum SUV of 3.8. Sclerosis and generalized increased metabolic activity in L2. Sclerotic areas are more focal. Metabolic activity more diffuse (image 161/3) maximum SUV of 4.8. Focal increased metabolic activity associated  with sclerotic lesion and less than a cm (image 186/3) maximum SUV 3.0 Rib fracture with some irregularity and mild uptake (image 136/3). Incidental CT findings: Post median sternotomy and spinal degenerative changes.  IMPRESSION: Multifocal bony metastases mainly sclerotic with mild to moderate increased metabolic activity suspicious for or prostate cancer metastases based on appearance and history.  PSA correlation may be helpful. For subsequent follow-up could consider PSMA PET. LEFT retroperitoneal soft tissue is similar to the recent CT displaying a nodular appearance and without substantial uptake. Metastatic process is considered in addition to post inflammatory changes in evolution or retroperitoneal fibrosis. Suggest short interval follow-up at 3 month interval with CT or PET as warranted. LEFT adrenal adenoma. Periadrenal stranding on the LEFT.     I, Rita Ohara, am acting as scribe for [name], MD  I have reviewed this report as typed by the medical scribe, and it is complete and accurate.

## 2021-05-19 ENCOUNTER — Telehealth (HOSPITAL_COMMUNITY): Payer: Self-pay

## 2021-05-19 LAB — BASIC METABOLIC PANEL
BUN: 53 — AB (ref 4–21)
CO2: 28 — AB (ref 13–22)
Chloride: 105 (ref 99–108)
Creatinine: 2.4 — AB (ref 0.6–1.3)
Glucose: 80
Potassium: 4.5 (ref 3.4–5.3)
Sodium: 138 (ref 137–147)

## 2021-05-19 LAB — COMPREHENSIVE METABOLIC PANEL: Calcium: 9.2 (ref 8.7–10.7)

## 2021-05-19 NOTE — Telephone Encounter (Signed)
Received a fax requesting medical records from [facility]. Records were successfully faxed to: [phone] ,which was the number provided.. Medical request form will be scanned into patients chart.

## 2021-05-20 ENCOUNTER — Encounter: Payer: Self-pay | Admitting: Internal Medicine

## 2021-05-20 ENCOUNTER — Encounter (HOSPITAL_COMMUNITY): Payer: Self-pay

## 2021-05-20 NOTE — Telephone Encounter (Signed)
Verbal order given to [facility].

## 2021-05-23 ENCOUNTER — Telehealth (HOSPITAL_COMMUNITY): Payer: Self-pay | Admitting: *Deleted

## 2021-05-23 NOTE — Telephone Encounter (Signed)
HHRN called to report pts low bp. Pt sitting bp 97/70 standing 70/? (RN was unable to hear diastolic number).  Pt dizzy while standing but asymptomatic otherwise. Per Dr.McLean where compression hose and keep appt Monday. HHRN aware.

## 2021-05-24 ENCOUNTER — Other Ambulatory Visit: Payer: Self-pay

## 2021-05-24 ENCOUNTER — Encounter: Payer: Self-pay | Admitting: Oncology

## 2021-05-24 ENCOUNTER — Encounter: Payer: Self-pay | Admitting: Internal Medicine

## 2021-05-24 ENCOUNTER — Ambulatory Visit (INDEPENDENT_AMBULATORY_CARE_PROVIDER_SITE_OTHER): Payer: Medicare Other | Admitting: Internal Medicine

## 2021-05-24 VITALS — BP 110/68 | HR 111 | Ht 65.0 in | Wt 178.4 lb

## 2021-05-24 DIAGNOSIS — E785 Hyperlipidemia, unspecified: Secondary | ICD-10-CM

## 2021-05-24 DIAGNOSIS — E1165 Type 2 diabetes mellitus with hyperglycemia: Secondary | ICD-10-CM

## 2021-05-24 DIAGNOSIS — E1159 Type 2 diabetes mellitus with other circulatory complications: Secondary | ICD-10-CM | POA: Diagnosis not present

## 2021-05-24 DIAGNOSIS — I6523 Occlusion and stenosis of bilateral carotid arteries: Secondary | ICD-10-CM

## 2021-05-24 DIAGNOSIS — Z6835 Body mass index (BMI) 35.0-35.9, adult: Secondary | ICD-10-CM

## 2021-05-24 LAB — CBC AND DIFFERENTIAL
HCT: 40 — AB (ref 41–53)
Hemoglobin: 12.6 — AB (ref 13.5–17.5)
Neutrophils Absolute: 4.54
Platelets: 199 (ref 150–399)
WBC: 5.9

## 2021-05-24 LAB — CBC: RBC: 4.56 (ref 3.87–5.11)

## 2021-05-24 NOTE — Progress Notes (Signed)
Patient ID: [name], male   DOB: 09/28/1940, 80 y.o.   MRN: [id number]   This visit occurred during the SARS-CoV-2 public health emergency.  Safety protocols were in place, including screening questions prior to the visit, additional usage of staff PPE, and extensive cleaning of exam room while observing appropriate contact time as indicated for disinfecting solutions.   HPI: [name] is a 80 y.o.-year-old male, referred by his cardiologist, Dr. 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  Last visit 4 months ago. He is here with his daughter.  Interim history: No increased urination, blurry vision, nausea, chest pain. Since last visit, he was admitted for UTI and a L renal neoplasm was incidentally seen on imaging.  He had a stent placed.  Unfortunately, he has sclerotic lesions in multiple bones suggesting metastatic disease, however, the diagnosis is not clear >> had bone Bx - results pending tomorrow. He is in a wheelchair today as he feels weak - had low blood pressure. Getting Home Health now.   Reviewed HbA1c levels: Lab Results  Component Value Date   HGBA1C 7.0 (H) 03/30/2021   HGBA1C 5.9 (A) 01/13/2021   HGBA1C 6.7 (A) 07/08/2020   HGBA1C 6.4 (A) 03/01/2020   HGBA1C 6.4 (A) 10/30/2019   HGBA1C 6.2 (A) 06/30/2019   HGBA1C 7.1 (A) 11/18/2018   HGBA1C 6.6 (A) 08/30/2018   HGBA1C 8.3 (A) 04/30/2018   HGBA1C 5.5 10/24/2012   Pt is on a regimen of: - Jardiance 10 mg before b'fast - added by cardiology 10/2020  - Tresiba 36 >> 26 >> 30 >> 46 >> 40 >> 34 >> 28 >> 20 >> 16 units daily - Ozempic 0.5 >> 1 mg weekly -started 04/2018 -no GI side effects.  This is expensive for him. He was on Januvia 50 mg daily, but this did not help >> stopped 2018. He was on Jardiance.  He checks his sugars once a day: - am:  105-130, 145 >>  80's, 91-138, 160 >> 80-121 >> 92-149, 254, 281 (pizza, cheese sticks) - 2h after b'fast: n/c - before lunch:  148-181 >> n/c >> 130, 136 >> 99, 144, 185 >> 80 >> see above - wakes up late - 2h after lunch: n/c >> 126-183 >> n/c - before dinner:  111-168, 182 >> n/c >> 95-136 >> n/c >> 103 >> 138 - 2h after dinner: 143-210 >> n/c >> 132, 175 >> 91, 146 >> 99-126 >> n/c - bedtime: n/c >> 151 >> n/c - nighttime: n/c Lowest sugar was  95 ... >> 70s >> 80s >> 80 >> 92; it is unclear at which level he has hypoglycemia awareness. Highest sugar was 256 ... >> 175 (grapes) >> 185 >> 200s >> 281.  Glucometer: One Touch Verio  Pt's meals are: - Breakfast: Banana, 2 clementines, different fruit in season, low-salt chips - Lunch: Meat, 2 veggies, toast, and sweet tea - Dinner: Sandwich (banana or meat, tomato), 1 Clementine - Snacks: Frequently after dinner He exercises on his recumbent bike.  -+ CKD, last BUN/creatinine:  Lab Results  Component Value Date   BUN 55 (A) 04/20/2021   BUN 40 (H) 04/03/2021   CREATININE 2.3 (A) 04/20/2021   CREATININE 2.07 (H) 04/03/2021  08/10/2020: glucose 81, BUN/creatinine 36/1.49, GFR 44, alkaline phosphatase 154 (40-129), ACR 179  -+ HL; last set of lipids: Lab Results  Component Value Date   CHOL 105 02/23/2021   HDL 26 (L) 02/23/2021   LDLCALC 64 02/23/2021   LDLDIRECT 61.5 05/15/2013   TRIG 77 02/23/2021   CHOLHDL 4.0 02/23/2021  08/10/2020: 101/138 (?  Unclear)/25/48 On Lipitor 80 and fish oil.  His cardiologist also recommended Vascepa - samples for 6 weeks but he could not afford it beyond that.  - last eye exam was on 05/02/2021: No DR.  Increased cataracts >> will have cataract sx 08/2021.  -No numbness and tingling in his feet.  Pt has FH of DM in S, B, Father.  She also has HTN, OSA. He had Covid19 03/2019.  Other labs reviewed: Labs from PCP (Dr. [name]) -08/10/2020: -Vitamin D 62.6 -BNP 1116 -CBC with differential  normal  ROS: Constitutional: + weight loss + See HPI  I reviewed pt's medications, allergies, PMH, social hx, family hx, and changes were documented in the history of present illness. Otherwise, unchanged from my initial visit note.  Past Medical History:  Diagnosis Date   AAA (abdominal aortic aneurysm)    5/14 CT showed > 6 cm AAA, also right iliac aneurysm. Not stent graft candidate.    Anemia of chronic disease    Anxiety    pt. admits that he has anxiety at times    Aortic stenosis    Moderate to severe by echo in 4/14. Bioprosthetic #21 [facility] Ease aortic valve replacement in 4/14.    Arthritis    CAD (coronary artery disease)    a. 4/14 CABG: LIMA-LAD, seq SVG-ramus & OM1, SVG-PDA   Carotid arterial disease (HCC)    Carotid dopplers (8/65) with 78-46% LICA stenosis.    CHF (congestive heart failure) ([place]) 07/2012; 10/17/2012   Chronic kidney disease    increased creatinine recently - being followed, near kidney failure fr. contrast dye    Chronic systolic heart failure ([place])    a. 1/14 ECHO: sev dil LV, EF30-35%, diff HK, mild MR, AS severe, AV grad 35 b. 8/14 ECHO: EF 55-60%, mild biopros AV sten mn grad. 25, RV mild dil, RA mild dil   Complication of anesthesia    during last surgery had to be given special medicine b/c "something dropped" during surgery    COPD (chronic obstructive pulmonary disease) ([place])    a.  History of heavy smoking. PFTs (4/14) with mixed obstructive (COPD) and restrictive (post-ARDS) picture.    Diabetes mellitus without complication ([place])    Esophageal reflux    History of blood transfusion    "lots since January" (10/17/2012)   Hyperlipidemia    Hypertension    Ischemic cardiomyopathy    Ischemic cardiomyopathy    a. 4/14 LHC: pLAD 95, ost ramus 70-80, mLCx 90, EF 30%   OSA (obstructive sleep apnea)    on cpap- every sleep time.    Pneumonia 07/2012   PNA (H1N1 influenza + pneumococcus) in 9/62 complicated by respiratory failure/ARDS.  Required tracheostomy, now weaned off. Had left empyema requiring chest tube.    Pneumonia due to COVID-19 virus 03/22/2019   Prediabetes    Shortness of breath    still goes deer hunting by himself   Past Surgical History:  Procedure Laterality Date   ABDOMINAL AORTIC ANEURYSM REPAIR N/A 07/10/2013   Procedure: Resection and Graftiong of perirenal AAA; Insertion 14 x 8 Hemashield Graft Aorta to Left Common Iliac and to Right Common Femoral Artery With Ligastion of Right External and Interanl Iliac Artery;  Surgeon: Mal Misty, MD;  Location: [facility];  Service: Vascular;  Laterality: N/A;   ANAL FISSURE REPAIR  2008   AORTIC VALVE REPLACEMENT N/A 10/25/2012   Procedure: AORTIC VALVE REPLACEMENT (AVR);  Surgeon: Melrose Nakayama, MD;  Location: [place];  Service: Open Heart Surgery;  Laterality: N/A;   CARDIAC VALVE REPLACEMENT     CORONARY ARTERY BYPASS GRAFT N/A 10/25/2012   Procedure: CORONARY ARTERY BYPASS GRAFTING (CABG);  Surgeon: Melrose Nakayama, MD;  Location: [facility];  Service: Open Heart Surgery;  Laterality: N/A;  times 4 using left internal mammary artery and endoscopically harvested bilateral saphenous vein    CYSTOSCOPY WITH RETROGRADE PYELOGRAM, URETEROSCOPY AND STENT PLACEMENT Left 03/31/2021   Procedure: CYSTOSCOPY WITH RETROGRADE PYELOGRAM, URETEROSCOPY, LEFT URETERAL mass biopsy, INSERTION LEFT URETERAL STENT;  Surgeon: Ardis Hughs, MD;  Location: [facility];  Service: Urology;  Laterality: Left;   EP IMPLANTABLE DEVICE N/A 09/22/2015   Procedure: BiV Upgrade;  Surgeon: Deboraha Sprang, MD;  Location: [facility];  Service: Cardiovascular;  Laterality: N/A;   INTRAOPERATIVE TRANSESOPHAGEAL ECHOCARDIOGRAM N/A 10/25/2012   Procedure: INTRAOPERATIVE TRANSESOPHAGEAL ECHOCARDIOGRAM;  Surgeon: Melrose Nakayama, MD;  Location: [place];  Service: Open Heart Surgery;  Laterality: N/A;   LEFT AND RIGHT HEART CATHETERIZATION WITH CORONARY ANGIOGRAM N/A 10/22/2012   Procedure:  LEFT AND RIGHT HEART CATHETERIZATION WITH CORONARY ANGIOGRAM;  Surgeon: Larey Dresser, MD;  Location: [facility] C[facility] LAB;  Service: Cardiovascular;  Laterality: N/A;   NASAL FRACTURE SURGERY  1970's   PACEMAKER REVISION  09/22/2015   pacemaker upgrade    PERMANENT PACEMAKER INSERTION N/A 07/15/2014   Procedure: PERMANENT PACEMAKER INSERTION;  Surgeon: Deboraha Sprang, MD;  Location: [facility] CATH LAB;  Service: Cardiovascular;  Laterality: N/A;   TRACHEOSTOMY  07/2012   TRACHEOSTOMY CLOSURE  08/2012   TRANSURETHRAL MICROWAVE THERAPY  10/15/2012   Social History   Socioeconomic History   Marital status: Married    Spouse name: Not on file   Number of children:  2   Years of education: Not on file   Highest education level: Not on file  Occupational History    Retired  Scientist, product/process development strain: Not on file   Food insecurity:    Worry: Not on file    Inability: Not on file   Transportation needs:    Medical: Not on file    Non-medical: Not on file  Tobacco Use   Smoking status: Former Smoker    Packs/day: 2.00    Years: 58.00    Pack years: 116.00    Types: Cigarettes    Last attempt to quit: 07/20/2012    Years since quitting: 5.7   Smokeless tobacco: Never Used  Substance and Sexual Activity   Alcohol use: No    Alcohol/week:  Beer once or twice a year: 2 drinks    Comment: 10/17/2012 "years since I've had a drink; never had problem with it"   Drug use: No   Sexual activity: Not Currently   Current Outpatient Medications on File Prior to Visit  Medication Sig Dispense Refill   acetaminophen (TYLENOL) 500 MG tablet Take 1 tablet (500 mg total) by mouth every 6 (six) hours as needed. 30 tablet 0   albuterol (VENTOLIN HFA) 108 (90 Base) MCG/ACT inhaler Inhale 2 puffs into the lungs every 6 (six) hours as needed for wheezing or shortness of breath. 8 g 2   allopurinol (ZYLOPRIM) 300 MG tablet Take 300 mg by mouth daily.  aspirin EC 81 MG tablet Take 81 mg by mouth daily.      atorvastatin (LIPITOR) 80 MG tablet TAKE 1 TABLET (80 MG TOTAL) BY MOUTH DAILY BEFORE BREAKFAST. 90 tablet 3   bisoprolol (ZEBETA) 5 MG tablet TAKE 1/2 TABLETS (2.5 MG TOTAL) BY MOUTH DAILY. 45 tablet 3   Blood Glucose Calibration (ONETOUCH VERIO) SOLN Use to calibrate one touch verio level 3 1 each 3   calcitRIOL (ROCALTROL) 0.25 MCG capsule Take 0.25 mcg by mouth daily.      dextromethorphan-guaiFENesin (MUCINEX DM) 30-600 MG 12hr tablet Take 1 tablet by mouth 2 (two) times daily.     empagliflozin (JARDIANCE) 10 MG TABS tablet Take 1 tablet (10 mg total) by mouth daily before breakfast. 30 tablet 6   fexofenadine (ALLEGRA) 180 MG tablet Take 90 mg by mouth daily.      fluticasone furoate-vilanterol (BREO ELLIPTA) 100-25 MCG/ACT AEPB 1 puff daily.     Fluticasone-Umeclidin-Vilant (TRELEGY ELLIPTA) 100-62.5-25 MCG/INH AEPB Inhale 1 puff into the lungs daily. 1 each 6   glucose blood (ONETOUCH VERIO) test strip Use to check blood sugar once a day. 100 strip 12   insulin degludec (TRESIBA FLEXTOUCH) 200 UNIT/ML FlexTouch Pen Inject 28 Units into the skin daily. (Patient taking differently: Inject 16 Units into the skin daily.)     Insulin Pen Needle 31G X 4 MM MISC 1 each by Does not apply route daily. Use to inject insulin daily 100 each 2   KLOR-CON M10 10 MEQ tablet Take 20 mEq by mouth 2 (two) times daily.     Omega-3 Fatty Acids (FISH OIL MAXIMUM STRENGTH PO) Take 1 capsule by mouth in the morning and at bedtime.      OZEMPIC, 1 MG/DOSE, 4 MG/3ML SOPN INJECT 1MG  INTO THE SKIN ONCE A WEEK 9 mL 0   pantoprazole (PROTONIX) 40 MG tablet Take 40 mg by mouth at bedtime.      spironolactone (ALDACTONE) 25 MG tablet Take 0.5 tablets (12.5 mg total) by mouth daily. 15 tablet 6   torsemide (DEMADEX) 20 MG tablet Take 2 tablets (40 mg total) by mouth in the morning AND 1 tablet (20 mg total) every evening. RESUME ONLY ON 04/07/2021. (Patient taking differently: Take 1 tablets (20 mg total) by mouth in  the morning AND 1 tablet (20 mg total) every evening. CHANGED DUE TO BP ISSUES) 360 tablet 3   No current facility-administered medications on file prior to visit.   Allergies  Allergen Reactions   Iodinated Diagnostic Agents Other (See Comments)    Decreased kidney function   Omnipaque [Iohexol] Other (See Comments)    Decreased kidney function   Primaxin [Imipenem] Hives   Cephalosporins Rash    Blisters    Lisinopril Nausea And Vomiting    Drops blood pressure    Clindamycin/Lincomycin Swelling and Rash   Family History  Problem Relation Age of Onset   Cancer Mother        breast   Heart disease Father    Congestive Heart Failure Other    Heart disease Other    Also,  hyperlipidemia, heart disease, hypertension and brother. Thyroid disease and cancer in mother.  PE: BP 110/68 (BP Location: Right Arm, Patient Position: Sitting, Cuff Size: Normal)   Pulse (!) 111   Ht 5\' 5"  (1.651 m)   Wt 178 lb 6.4 oz (80.9 kg)   SpO2 93%   BMI 29.69 kg/m  Wt Readings from Last 3 Encounters:  05/24/21 178 lb  6.4 oz (80.9 kg)  04/20/21 187 lb 12.8 oz (85.2 kg)  03/30/21 195 lb 11.2 oz (88.8 kg)   Constitutional: overweight, in NAD, in wheelchair Eyes: PERRLA, EOMI, no exophthalmos ENT: moist mucous membranes, no thyromegaly, no cervical lymphadenopathy Cardiovascular: tachycardia, RR, No MRG Respiratory: CTA B Musculoskeletal: no deformities, strength intact in all 4 Skin: moist, warm, + rash on forearm- chronic ecchymoses Neurological: + Mild tremor with outstretched hands, DTR normal in all 4  ASSESSMENT: 1. 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 HL  3.  Obesity class II  PLAN:  1. Patient with longstanding uncontrolled type 2 diabetes, on basal insulin, SGLT2 inhibitor and weekly GLP-1 receptor agonist with significant weight loss in the  last several months after which his blood sugars improved and he had to decrease [place] due to low blood sugars.  An HbA1c obtained 2 months ago was higher, at 7.0%, but still at goal.  However, he now has a diagnosis of metastatic cancer after a renal neoplasm was incidentally seen on imaging for UTI. -At this visit, per review of his meter downloads, it appears that his blood sugars are mostly active slightly above goal, with 2 exceptions in the 200s.  Daughter describes that he had appointment since he could not eat and then had pizza and other dietary indiscretions the night before due to high values.  At this visit, we discussed about stopping regular Gatorade and I advised him to get diet Gatorade but otherwise we can continue the same dose of Jardiance and Ozempic.  I did advise him to increase Tresiba slightly to especially if the sugars remain high after stopping regular Gatorade. - I suggested to:  Patient Instructions  Please continue: - Jardiance 10 mg before b'fast - Ozempic 1 mg weekly  Please consider increasing: - Tresiba U200 to 18-20 units daily  Try to switch to diet Gatorade.  Please return in 4 months with your sugar log.   - advised to check sugars at different times of the day - 1x a day, rotating check times - advised for yearly eye exams >> he is UTD - return to clinic in 4 months  2. HL -Reviewed latest lipid panel from 01/2021: LDL at goal, HDL slightly low -Continues Lipitor 80 mg daily and fish oil without side effects  3.  Obesity class 2 -We will continue Ozempic which should also help with weight loss.  He is also on Jardiance, which should further help with this -At last visit, he lost 13 pounds since the previous visit.  Since then, he lost 39 more pounds.  He tells me that he does not like the food and is not eating much.  At next visit, if he continues to lose weight, we may need to stop Ozempic.  Philemon Kingdom, MD PhD [facility] Endocrinology

## 2021-05-24 NOTE — Patient Instructions (Addendum)
Please continue: - Jardiance 10 mg before b'fast - Ozempic 1 mg weekly  Please consider increasing: - Tresiba U200 to 18-20 units daily  Try to switch to diet Gatorade.  Please return in 4 months with your sugar log.

## 2021-05-24 NOTE — Progress Notes (Signed)
ID:  [name], DOB 08/13/1940, MRN [id number]   Provider location: [place] [facility] Type of Visit: Established patient   PCP:  [name], MD  HF Cardiologist:  Dr. [name]   History of Present Illness: [name] is a 80 y.o. male who has a history of ARDS/respiratory failure and tracheostomy in 07/2012 as well as DM, HTN, and COPD presented to the ER at [place] with CHF in 10/2012. Patient had a prolonged hospitalization in 07/2012 with H1N1 influenza and pneumococcal PNA. This progressed to ARDS. He was intubated and ended up with tracheostomy. He had the trach removed. He also had a left empyema requiring chest tube, septic shock with elevated troponin, and AKI which resolved.  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   He was admitted again in 10/2012 from his nursing home with exertional dyspnea and orthopnea. He had gained 21 lbs. Echo at admission showed EF 35% with global hypokinesis that looked worse in the anteroseptal wall. He also was noted to have aortic stenosis rated as moderate to severe. He was diuresed and cathed, showing severe 3 vessel disease. He then had CABG-AVR (bioprosthetic valve).   He had a large AAA and underwent surgical repair in 07/2013.  He had AKI and Pseudomonas PNA post-operatively.  He was discharged to a nursing home. Echo in 1/15 showed EF 55-60% with mildly dilated and dysfunctional RV and a bioprosthetic aortic valve with mean gradient 32 mmHg (higher than expected).   Repeat limited echo in 08/2013 showed shower aortic valve mean gradient of 21 mmHg.   In 11/15, patient had a holter monitor placed for bradycardia.  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  Baseline bifascicular block.  His HR also was noted to drop into the 40s with exercise, suggesting exercise-associated heart block.  He underwent Medtronic-PCM placement in 1/16 by Dr Caryl Comes.   Echo in 10/16 showed fall in EF to 30% with mean aortic valve  gradient 21 mmHg.  Repeat echo in 2/17 showed EF 20-25%, mean aortic valve gradient 24 mmHg with moderately decreased RV systolic function.  Cardiolite in 10/16 showed inferior scar with no ischemia.  He is RV pacing almost all the time.  Low dose DSE was done in 2/17 to assess for significant bioprosthetic valve stenosis => mean gradient with dobutamine was only 31 mmHg, so suspect no more than moderate stenosis.  He had St Jude CRT upgrade in 3/17.  He decided against ICD.  Repeat echo post-CRT in 8/17 showed EF 50%, diffuse hypokinesis, bioprosthetic aortic valve with mean gradient 27 mmHg.  Echo in 9/18 showed EF 50-55%, bioprosthetic aortic valve with mean gradient 25 mmHg.  Echo in 10/19 showed EF 50-55%, bioprosthetic aortic valve with mean gradient 25 mmHg.   In 9/20, he was admitted with COVID-19 infection.    Echo in 3/21 showed EF 50-55% with mild LVH, mild RV enlargement with normal function, PASP 25, bioprosthetic aortic valve with mean gradient 24 and AVA 1.38 cm^2.   Echo was done 8/22 showing EF 45-50%, mild RV dilation with normal systolic function, bioprosthetic aortic valve with mean gradient 21 mmHg and no PVL, IVC normal.   Admitted 10/22 with left flank pain, had UTI. Imaging studies suggested left kidney neoplasm and found to have sclerotic lesions in multiple bones. He had a stent placed.  Had bone Bx - results pending.  Admitted to [facility] 11/15-11/17/22 with dehydration and UTI. Bisoprolol and spiro stopped. Echo showed EF 45-50%.  Today he returns for HF follow up with his wife and daughter. He is in a wheelchair due to deconditioning and plans to have [facility] PT start soon. He is SOB when getting out of his wheelchair. Has some dizziness in the mornings. Has some testicular pain, s/p ureter stenting, this will be removed tomorrow at [facility]. He is incontinent, per daughter.  Appetite is poor, drinking Ensure and Boost.  Denies CP, palpitations, abnormal bleeding, edema, or  PND/Orthopnea.. No fever or chills. Weight rapidly declining. Taking all medications. Unable to tolerate CPAP.  ECG (personally reviewed): BiV pacing  Device interrogation (personally reviewed): CoreVue stable, impedence at baseline, no AT/AF.  Labs (2/15): K 4, creatinine 1.6, BNP 166 Labs (3/15): Hgb 11.6, pro-BNP 1037, creatinine 1.76, BUN 30, LDL 75, HDL 26 Labs (8/15): K 3.9, creatinine 1.55, HCT 40.6, LDL 71, HDL 27 Labs (1/16): K 4.3, Creatinine 1.6, HCT 42.9 Labs (2/17): K 4.0 Creatinine 1.64, TSH normal.  Labs (3/17): K 4.5, creatinine 1.49, HCT 42.9 Labs (4/17): K 4, creatinine 1.48, BNP 125 Labs (6/17): LDL 27, HDL 24, TGs 245 Labs (7/17): K 4.9, creatinine 1.71 Labs (8/17): K 4.3, creatinine 1.7, LDL 64, HDl 25, TGs 198 Lab (06/16/2016): K 4.9 Creatinine 1.84  Labs (10/2016): Creatinine 1.5 K 3.9  Labs (6/18): K 4.4, creatinine 1.57, BNP 55 Labs (7/18): LDL 58, HDL 22, creatinine 1.64 Labs (11/19): K 3.9, creatinine 1.5  Labs (9/20): K 4, creatinine 1.62, hgb 15.5 Labs (2/21): LDL 52, HDL 24, TGs 202, hgb 14.2, K 3.8, creatinine 1.6 Labs (3/21): K 3.5, creatinine 1.8 Labs (6/21): K 4, creatinine 1.68 Labs (9/21): LDL 48, TGs 174 Labs (2/22): hgb 14.6, K 3.6, creatinine 1.49 Labs (5/22): K 4.2, creatinine 1.97, BNP 156 Labs (8/22): LDL 64, HDL 26 Labs (11/22): K 4.6, creatinine 2.2, alk phos 214  PMH: 1. PNA (H1N1 influenza + pneumococcus) in 6/38 complicated by respiratory failure/ARDS. Required tracheostomy, now weaned off. Had left empyema requiring chest tube.  Has developed post-infectious pulmonary fibrosis.  2. Prostate CA s/p radiation treatment. Has indwelling foley.  3. OSA: using CPAP 4. HTN  5. Hyperlipidemia  6. COPD: History of heavy smoking. PFTs (4/14) with mixed obstructive (COPD) and restrictive (post-ARDS) picture.  - CT chest 12/19 consistent with emphysema.  7. Type II diabetes  8. Anemia of chronic disease.  9. Ischemic cardiomyopathy:  - Echo  (1/14) with severely dilated LV, EF 30-35%, diffuse hypokinesis worse in the anteroseptal wall, grade II diastolic dysfunction, mild MR, AS interpreted as "moderate to severe" with aortic valve mean gradient 23 mmHg.  - Echo (4/14) witih EF 35-40%, mid to apical anteroseptal akinesis, moderate to severe AS with mean gradient 33 mmHg, AVA 0.91 cm^2.  -Echo (5/14) post CABG showed EF 50%, anteroseptal hypokinesis, bioprosthetic aortic valve well-seated. - Echo (1/15) with EF 55-60%, mild LVH, mildly dilated RV with mildly decreased systolic function, D-shaped interventricular septum, bioprosthetic aortic valve with mean gardient 32 mmHg.   - Echo (3/15) with EF 55-60%, mild LVH, bioprosthetic aortic valve with mean gradient 21 mmHg (lower), no AI, RV moderately dilated with mild to moderately decreased systolic function.   - Echo (10/16) with EF 30%, bioprosthetic aortic valve with mean gradient 21 mmHg.   - Echo (2/17) with EF 20-25%, mild LVH, aortic valve mean gradient 24 mmHg/peak 39 mmHg, RV moderately dilated with moderately decreased systolic function.  - St Jude CRT upgrade in 3/17 (did not want ICD).  - Echo (8/17) with EF 50%, diffuse  hypokinesis, bioprosthetic aortic valve with mean gradient 27 mmHg, normal RV size with mildly decreased systolic function.   - AKI with lisinopril.  - Echo (9/18): EF 50-55%, bioprosthetic aortic valve with mean gradient 25 mmHg.  - Echo (10/19): EF 50-55%, bioprosthetic aortic valve with mean gradient 25 mmHg, normal RV size and systolic function.  - Echo (3/21): EF 50-55% with mild LVH, mild RV enlargement with normal function, PASP 25, bioprosthetic aortic valve with mean gradient 24 and AVA 1.38 cm^2.    - Echo (8/22): EF 45-50%, mild RV dilation with normal systolic function, bioprosthetic aortic valve with mean gradient 21 mmHg and no PVL, IVC normal. 10. Aortic stenosis: Moderate to severe by echo in 4/14. Bioprosthetic #21 [facility] Ease aortic valve  replacement in 4/14.  Low dose DSE (2/17) showed that Mr Helfman likely has no more than moderate bioprosthetic valve stenosis with mean gradient up to 31 mmHg with dobutamine.  11. CAD: LHC (4/14) with 95% pLAD, 70-80% ostial ramus, 70% mLCx, 90% pRCA, EF 35%.  - CABG 4/14 with LIMA-LAD, seq SVG-ramus and OM1, SVG-PDA.  - Cardiolite (10/16) with EF 42%, fixed inferior defect, no ischemia.   12. Carotid stenosis: Carotid dopplers (1/51) with 76-16% LICA stenosis.  Carotids (0/73) with 71-06% LICA stenosis. Carotids (2/69) with 48-54% LICA stenosis.  - Carotid dopplers (6/27): 03-50% LICA stenosis.  - Carotid dopplers (0/93): 81-82% LICA stenosis.  13. AAA: 5/14 CT showed > 6 cm AAA, also right iliac aneurysm. Not stent graft candidate.  7/14 CT showed 7 cm AAA.  Surgical repair 1/15.  14. CKD: AKI in 7/14 from contrast nephropathy.  AKI in 1/15 after AAA repair.  15. Contrast allergy.  16. ABIs 9/14 were normal.  17. Gout 18. 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  HR drops with exercise (likely exercise-induced heart block).  Medtronic dual chamber PPM placed in 2016.  Recent pacemaker interrogation showed complete heart block.  He is pacemaker-dependent, upgraded to CRT in 3/17.  19. COVID-19 PNA in 9/20  SH: Quit smoking in 1/14, married, no ETOH, lives in [place].   FH: No premature CAD  ROS: All systems reviewed and negative except as per HPI.    Current Outpatient Medications  Medication Sig Dispense Refill   acetaminophen (TYLENOL) 500 MG tablet Take 1 tablet (500 mg total) by mouth every 6 (six) hours as needed. 30 tablet 0   albuterol (VENTOLIN HFA) 108 (90 Base) MCG/ACT inhaler Inhale 2 puffs into the lungs every 6 (six) hours as needed for wheezing or shortness of breath. (Patient taking differently: Inhale 3 puffs into the lungs every 6 (six) hours as needed for wheezing or shortness of breath.) 8 g 2   allopurinol (ZYLOPRIM) 300 MG tablet Take 300  mg by mouth daily.     aspirin EC 81 MG tablet Take 81 mg by mouth daily.     atorvastatin (LIPITOR) 80 MG tablet TAKE 1 TABLET (80 MG TOTAL) BY MOUTH DAILY BEFORE BREAKFAST. 90 tablet 3   Blood Glucose Calibration (ONETOUCH VERIO) SOLN Use to calibrate one touch verio level 3 1 each 3   calcitRIOL (ROCALTROL) 0.25 MCG capsule Take 0.25 mcg by mouth daily.      dextromethorphan-guaiFENesin (MUCINEX DM) 30-600 MG 12hr tablet Take 1 tablet by mouth 2 (two) times daily.     empagliflozin (JARDIANCE) 10 MG TABS tablet Take 1 tablet (10 mg total) by mouth daily before breakfast. 30 tablet 6  fexofenadine (ALLEGRA) 180 MG tablet Take 90 mg by mouth daily.      fluticasone furoate-vilanterol (BREO ELLIPTA) 100-25 MCG/ACT AEPB 1 puff daily.     Fluticasone-Umeclidin-Vilant (TRELEGY ELLIPTA) 100-62.5-25 MCG/INH AEPB Inhale 1 puff into the lungs daily. 1 each 6   glucose blood (ONETOUCH VERIO) test strip Use to check blood sugar once a day. 100 strip 12   insulin degludec (TRESIBA FLEXTOUCH) 200 UNIT/ML FlexTouch Pen Inject 28 Units into the skin daily. (Patient taking differently: Inject 16 Units into the skin daily.)     Insulin Pen Needle 31G X 4 MM MISC 1 each by Does not apply route daily. Use to inject insulin daily 100 each 2   Omega-3 Fatty Acids (FISH OIL MAXIMUM STRENGTH PO) Take 1 capsule by mouth in the morning and at bedtime.      OZEMPIC, 1 MG/DOSE, 4 MG/3ML SOPN INJECT 1MG INTO THE SKIN ONCE A WEEK 9 mL 0   pantoprazole (PROTONIX) 40 MG tablet Take 40 mg by mouth at bedtime.      Potassium Chloride (KLOR-CON 10 PO) Take 10 mg by mouth daily.     torsemide (DEMADEX) 20 MG tablet Take 2 tablets (40 mg total) by mouth in the morning AND 1 tablet (20 mg total) every evening. RESUME ONLY ON 04/07/2021. (Patient taking differently: Take 1 tablets (20 mg total) by mouth in the morning AND 1 tablet (20 mg total) every evening. CHANGED DUE TO BP ISSUES) 360 tablet 3   No current facility-administered  medications for this encounter.   BP 90/60   Pulse 99   Wt 80.4 kg   SpO2 94%   BMI 29.49 kg/m   Wt Readings from Last 3 Encounters:  05/30/21 80.4 kg  05/25/21 80.5 kg  05/24/21 80.9 kg   General:  NAD. No resp difficulty, arrived in [facility], weak-appearing. HEENT: Normal Neck: Supple. No JVD. Carotids 2+ bilat; no bruits. No lymphadenopathy or thryomegaly appreciated. Cor: PMI nondisplaced. Regular rate & rhythm. No rubs, gallops, 2/6 SEM RUSB Lungs: Diminished in bases. Abdomen: Soft, nontender, nondistended. No hepatosplenomegaly. No bruits or masses. Good bowel sounds. Extremities: No cyanosis, clubbing, rash, trace pedal edema Neuro: Alert & oriented x 3, cranial nerves grossly intact. Moves all 4 extremities w/o difficulty. Affect pleasant.  ASSESSMENT & PLAN: 1. CAD: Status post CABG for 3VD.  Cardiolite in 10/16 with inferior scar, no ischemia.  No chest pain.  - Continue ASA, statin.   2. Chronic systolic CHF => chronic HF with mid range EF:  Echo in 2/17 showed EF down to 20-25%.  No ischemia on Cardiolite.  Constant RV pacing may have led to decline in EF.  We considered bioprosthetic valve stenosis as cause of fall in EF, but low gradient DSE did not suggest severe stenosis.  Echo 8/17 after CRT upgrade shows improved EF to 50%.  Echo in 3/21 showed EF 50-55%.  Echo 8/22 EF 45-50%.  Weight down significantly, likely due to recent CA.  NYHA class II symptoms, functional class confounded by physical deconditioning. He is not volume overloaded on exam or by device interrogation, appears to be mildly hypovolemic. - Continue compression hose. - With low BP, start midodrine 2.5 mg tid. - Stop Jardiance w/ recent UTI, fungal groin infection, and incontinence. Will notify Dr. Cruzita Lederer of this change.  - Off bisoprolol & spiro as of recent hospitalization. No BP room to add today.  - AKI with lisinopril, will leave off.  - With poor PO intake, decrease  torsemide to 20 mg daily. - Will  enroll in monthly fluid monitoring, and will have device RN send transmission in 1 week to monitor volume status with above medication changes. 3. 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 Suspect that intrinsic lung disease (post-infectious pulmonary fibrosis) is a contributor to his dyspnea and hypoxemia. He is now off oxygen and no longer wears CPAP.  He had emphysema noted on 12/19 CT.  - Follows with pulmonary.   4. Carotid stenosis: 32-02% LICA stenosis 3/34. Repeat in 1 year. 5. Hyperlipidemia: Lipids ok 8/22. 6. AAA: s/p surgical repair. 7. Status post bioprosthetic AVR: Gradient across the aortic valve has been elevated. There was some concern for possible low gradient severe bioprosthetic aortic stenosis, but low dose DSE in 2017 suggested that valvular stenosis was no more than moderate.  Mean gradient 24 mmHg across bioprosthetic valve on 3/21 echo. Mean gradient was 21 mmHg across bioprosthetic aortic valve on echo 8/22.  The valve appears stable, not to the point where he would benefit from TAVR.     - He will need antibiotic prophylaxis with dental work.  - If bioprosthetic aortic valve stenosis worsens, likely would not be a TAVR candidate with his recent metastatic cancer diagnosis.  8. CKD: Stage III. Labs reviewed from 05/25/21, SCr 2.2. Stable. 9. 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  S/p Medtronic PCM 1/16.   Patient is pacemaker-dependent with complete heart block.  He had upgrade to CRT in 3/17.   10. OSA: He is no longer on cpap. 11. Gout: On allopurinol. No recent flares. 12. Metastatic bone cancer: Awaiting further labs to confirm site of origin. He is not a candidate for chemotherapy or radiation. He will trial immunotherapy. Followed by Dr. Hinton Rao in [place]. 13. Physical Deconditioning: Renew HH PT/RN/aide with [facility] as requested by family. - Continue Ensure/Boost.  Followup with Dr. [name] in 6-8  weeks.  Signed, Rafael Bihari, FNP  05/30/2021  [facility] [address] [facility] and [place] [address] [phone] (office) [phone] (fax)

## 2021-05-25 ENCOUNTER — Inpatient Hospital Stay: Payer: Medicare Other | Attending: Oncology | Admitting: Oncology

## 2021-05-25 ENCOUNTER — Other Ambulatory Visit: Payer: Self-pay | Admitting: Hematology and Oncology

## 2021-05-25 ENCOUNTER — Telehealth: Payer: Self-pay

## 2021-05-25 ENCOUNTER — Other Ambulatory Visit: Payer: Self-pay | Admitting: Oncology

## 2021-05-25 ENCOUNTER — Encounter: Payer: Self-pay | Admitting: Oncology

## 2021-05-25 ENCOUNTER — Telehealth: Payer: Self-pay | Admitting: Oncology

## 2021-05-25 ENCOUNTER — Inpatient Hospital Stay: Payer: Medicare Other

## 2021-05-25 VITALS — BP 104/69 | HR 107 | Temp 98.4°F | Resp 16 | Ht 65.0 in | Wt 177.5 lb

## 2021-05-25 DIAGNOSIS — C7951 Secondary malignant neoplasm of bone: Secondary | ICD-10-CM

## 2021-05-25 DIAGNOSIS — C801 Malignant (primary) neoplasm, unspecified: Secondary | ICD-10-CM

## 2021-05-25 LAB — HEPATIC FUNCTION PANEL
ALT: 37 (ref 10–40)
AST: 48 — AB (ref 14–40)
Alkaline Phosphatase: 214 — AB (ref 25–125)
Bilirubin, Total: 1.1

## 2021-05-25 LAB — CBC AND DIFFERENTIAL
HCT: 41 (ref 41–53)
Hemoglobin: 12.9 — AB (ref 13.5–17.5)
Neutrophils Absolute: 3.47
Platelets: 174 (ref 150–399)
WBC: 4.5

## 2021-05-25 LAB — BASIC METABOLIC PANEL
BUN: 55 — AB (ref 4–21)
CO2: 25 — AB (ref 13–22)
Chloride: 101 (ref 99–108)
Creatinine: 2.2 — AB (ref 0.6–1.3)
Glucose: 212
Potassium: 4.6 (ref 3.4–5.3)
Sodium: 135 — AB (ref 137–147)

## 2021-05-25 LAB — COMPREHENSIVE METABOLIC PANEL
Albumin: 3.4 — AB (ref 3.5–5.0)
Calcium: 9.3 (ref 8.7–10.7)

## 2021-05-25 LAB — CBC: RBC: 4.73 (ref 3.87–5.11)

## 2021-05-25 NOTE — Telephone Encounter (Signed)
Biopsy results have not been received. [name] in [facility] pathology called. [name] will contact Dr. Lawerance Bach to contact Dr. Hinton Rao.

## 2021-05-25 NOTE — Telephone Encounter (Signed)
-----   Message from [name], MD sent at 05/24/2021  5:20 PM EST ----- Regarding: path We need result of bx from last week

## 2021-05-25 NOTE — Telephone Encounter (Signed)
05/25/21 left vm-next appt scheduled

## 2021-05-30 ENCOUNTER — Ambulatory Visit (HOSPITAL_COMMUNITY)
Admission: RE | Admit: 2021-05-30 | Discharge: 2021-05-30 | Disposition: A | Discharge status: Home / Self Care | Payer: Medicare Other | Source: Ambulatory Visit | Attending: Cardiology | Admitting: Cardiology

## 2021-05-30 ENCOUNTER — Encounter (HOSPITAL_COMMUNITY): Payer: Self-pay

## 2021-05-30 VITALS — BP 90/60 | HR 99 | Wt 177.2 lb

## 2021-05-30 DIAGNOSIS — Y838 Other surgical procedures as the cause of abnormal reaction of the patient, or of later complication, without mention of misadventure at the time of the procedure: Secondary | ICD-10-CM | POA: Diagnosis not present

## 2021-05-30 DIAGNOSIS — I350 Nonrheumatic aortic (valve) stenosis: Secondary | ICD-10-CM | POA: Diagnosis not present

## 2021-05-30 DIAGNOSIS — R32 Unspecified urinary incontinence: Secondary | ICD-10-CM | POA: Insufficient documentation

## 2021-05-30 DIAGNOSIS — I6523 Occlusion and stenosis of bilateral carotid arteries: Secondary | ICD-10-CM | POA: Diagnosis not present

## 2021-05-30 DIAGNOSIS — G4733 Obstructive sleep apnea (adult) (pediatric): Secondary | ICD-10-CM

## 2021-05-30 DIAGNOSIS — I130 Hypertensive heart and chronic kidney disease with heart failure and stage 1 through stage 4 chronic kidney disease, or unspecified chronic kidney disease: Secondary | ICD-10-CM | POA: Insufficient documentation

## 2021-05-30 DIAGNOSIS — Z87891 Personal history of nicotine dependence: Secondary | ICD-10-CM | POA: Insufficient documentation

## 2021-05-30 DIAGNOSIS — B356 Tinea cruris: Secondary | ICD-10-CM | POA: Insufficient documentation

## 2021-05-30 DIAGNOSIS — Z87440 Personal history of urinary (tract) infections: Secondary | ICD-10-CM | POA: Insufficient documentation

## 2021-05-30 DIAGNOSIS — C419 Malignant neoplasm of bone and articular cartilage, unspecified: Secondary | ICD-10-CM

## 2021-05-30 DIAGNOSIS — J8410 Pulmonary fibrosis, unspecified: Secondary | ICD-10-CM | POA: Diagnosis not present

## 2021-05-30 DIAGNOSIS — I5022 Chronic systolic (congestive) heart failure: Secondary | ICD-10-CM | POA: Insufficient documentation

## 2021-05-30 DIAGNOSIS — I714 Abdominal aortic aneurysm, without rupture, unspecified: Secondary | ICD-10-CM

## 2021-05-30 DIAGNOSIS — C801 Malignant (primary) neoplasm, unspecified: Secondary | ICD-10-CM | POA: Insufficient documentation

## 2021-05-30 DIAGNOSIS — I5032 Chronic diastolic (congestive) heart failure: Secondary | ICD-10-CM | POA: Diagnosis not present

## 2021-05-30 DIAGNOSIS — I2510 Atherosclerotic heart disease of native coronary artery without angina pectoris: Secondary | ICD-10-CM | POA: Diagnosis not present

## 2021-05-30 DIAGNOSIS — Z950 Presence of cardiac pacemaker: Secondary | ICD-10-CM | POA: Insufficient documentation

## 2021-05-30 DIAGNOSIS — Z8616 Personal history of COVID-19: Secondary | ICD-10-CM | POA: Diagnosis not present

## 2021-05-30 DIAGNOSIS — I441 Atrioventricular block, second degree: Secondary | ICD-10-CM | POA: Insufficient documentation

## 2021-05-30 DIAGNOSIS — J439 Emphysema, unspecified: Secondary | ICD-10-CM | POA: Insufficient documentation

## 2021-05-30 DIAGNOSIS — IMO0001 Reserved for inherently not codable concepts without codable children: Secondary | ICD-10-CM

## 2021-05-30 DIAGNOSIS — T82858D Stenosis of vascular prosthetic devices, implants and grafts, subsequent encounter: Secondary | ICD-10-CM | POA: Insufficient documentation

## 2021-05-30 DIAGNOSIS — Z79899 Other long term (current) drug therapy: Secondary | ICD-10-CM | POA: Insufficient documentation

## 2021-05-30 DIAGNOSIS — Z8679 Personal history of other diseases of the circulatory system: Secondary | ICD-10-CM

## 2021-05-30 DIAGNOSIS — E7849 Other hyperlipidemia: Secondary | ICD-10-CM | POA: Diagnosis not present

## 2021-05-30 DIAGNOSIS — M109 Gout, unspecified: Secondary | ICD-10-CM | POA: Diagnosis not present

## 2021-05-30 DIAGNOSIS — Z9889 Other specified postprocedural states: Secondary | ICD-10-CM

## 2021-05-30 DIAGNOSIS — N390 Urinary tract infection, site not specified: Secondary | ICD-10-CM | POA: Diagnosis not present

## 2021-05-30 DIAGNOSIS — R5381 Other malaise: Secondary | ICD-10-CM

## 2021-05-30 DIAGNOSIS — C7951 Secondary malignant neoplasm of bone: Secondary | ICD-10-CM | POA: Diagnosis not present

## 2021-05-30 DIAGNOSIS — Z951 Presence of aortocoronary bypass graft: Secondary | ICD-10-CM | POA: Diagnosis not present

## 2021-05-30 DIAGNOSIS — M1A9XX0 Chronic gout, unspecified, without tophus (tophi): Secondary | ICD-10-CM

## 2021-05-30 DIAGNOSIS — N183 Chronic kidney disease, stage 3 unspecified: Secondary | ICD-10-CM | POA: Diagnosis not present

## 2021-05-30 DIAGNOSIS — E785 Hyperlipidemia, unspecified: Secondary | ICD-10-CM | POA: Insufficient documentation

## 2021-05-30 DIAGNOSIS — E1122 Type 2 diabetes mellitus with diabetic chronic kidney disease: Secondary | ICD-10-CM | POA: Diagnosis not present

## 2021-05-30 DIAGNOSIS — Z7982 Long term (current) use of aspirin: Secondary | ICD-10-CM | POA: Insufficient documentation

## 2021-05-30 DIAGNOSIS — Z953 Presence of xenogenic heart valve: Secondary | ICD-10-CM | POA: Insufficient documentation

## 2021-05-30 DIAGNOSIS — I442 Atrioventricular block, complete: Secondary | ICD-10-CM

## 2021-05-30 MED ORDER — MIDODRINE HCL 2.5 MG PO TABS: 2.5000 mg | ORAL_TABLET | Freq: Three times a day (TID) | ORAL | 4 refills | Status: AC

## 2021-05-30 MED ORDER — TORSEMIDE 20 MG PO TABS: 20.0000 mg | ORAL_TABLET | Freq: Every day | ORAL | 4 refills | Status: AC

## 2021-05-30 NOTE — Patient Instructions (Addendum)
Thank You for coming in today  PLEASE wear your compression hose  START Midodrine 2.5 mg 1 tablet 3 times daily  DECREASE Torsemide to 20 mg 1 tablet daily   Your physician recommends that you schedule a follow-up appointment in: 6 weeks with Dr. [name]  At the [facility], you and your health needs are our priority. As part of our continuing mission to provide you with exceptional heart care, we have created designated Provider Care Teams. These Care Teams include your primary Cardiologist (physician) and Advanced Practice Providers (APPs- Physician Assistants and Nurse Practitioners) who all work together to provide you with the care you need, when you need it.   You may see any of the following providers on your designated Care Team at your next follow up: Dr Glori Bickers Dr Haynes Kerns, NP Lyda Jester, [place] [facility] [place], [place] Audry Riles, PharmD   Please be sure to bring in all your medications bottles to every appointment.   If you have any questions or concerns before your next appointment please send [place] a message through [place] or call our office at [phone].    TO LEAVE A MESSAGE FOR THE NURSE SELECT OPTION 2, PLEASE LEAVE A MESSAGE INCLUDING: YOUR NAME DATE OF BIRTH CALL BACK NUMBER REASON FOR CALL**this is important as we prioritize the call backs  YOU WILL RECEIVE A CALL BACK THE SAME DAY AS LONG AS YOU CALL BEFORE 4:00 PM

## 2021-06-01 ENCOUNTER — Encounter: Payer: Self-pay | Admitting: Oncology

## 2021-06-01 ENCOUNTER — Other Ambulatory Visit: Payer: Self-pay | Admitting: Oncology

## 2021-06-01 DIAGNOSIS — C801 Malignant (primary) neoplasm, unspecified: Secondary | ICD-10-CM

## 2021-06-01 MED ORDER — ONDANSETRON HCL 4 MG PO TABS: 4.0000 mg | ORAL_TABLET | Freq: Three times a day (TID) | ORAL | 5 refills | Status: AC | PRN

## 2021-06-02 ENCOUNTER — Other Ambulatory Visit (HOSPITAL_COMMUNITY): Payer: Self-pay | Admitting: Cardiology

## 2021-06-02 NOTE — Progress Notes (Signed)
[name]  [address] [place],  [place]  [address] [phone]  Clinic Day:  06/09/2021  Referring physician: [name], MD  This document serves as a record of services personally performed by Vincent Poisson, MD. It was created on their behalf by [name], a trained medical scribe. The creation of this record is based on the scribe's personal observations and the provider's statements to them.  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 SPEP was negative for monoclonal spike. There is insufficient tissue to determine further delineation and I do not feel there is benefit to getting more tissue in view of the low yield, morbidity and his poor performance status. Guardant testing was pursued but did not reveal any targetable mutations.  Former smoker, quit in 2014. He has been getting annual CT for lung cancer screening, most recent July 2021, with benign findings.  Chronic kidney disease, mildly improved. He has been following with a nephrologist.  Testicular pain. This has persisted for the past several months, and is likely referred pain from the lower spine. He uses Tylenol as needed.  Hypotension, severe and associated with dehydration. He has been placed on midodrine.  Decreased appetite and weight loss. He does supplement with Ensure. We will try him on Remeron 7.5 mg at bedtime as an appetite stimulant.   Unfortunately, Guardant testing has not revealed any targetable mutations. MSI-High was not detected, and so likely, he would not respond to immunotherapy, and with his performance status of 2, he would likely not able to tolerate an aggressive treatment approach. Therefore, we will plan to continue with palliative and supportive care. In view of  his worsening appetite and weight loss, we will try him on Remeron 7.5 mg at bedtime. He may continue Tylenol as needed for his pain. He may discontinue Lipitor and allopurinol. We will plan to see him back in 1 month with CBC, CMP and uric acid for repeat evaluation. Eventually, he may benefit from Hospice to provide home support and aids. Hospice and their services were reviewed today. He and his family understand and agree with this plan of care. I have answered their questions and they know to call with any concerns.  I provided 30 minutes of face-to-face time during this this encounter and > 50% was spent counseling as documented under my assessment and plan.    Vincent Kaplan, MD [facility] [address] [place] [address] Dept: [phone] Dept Fax: [phone]    CHIEF COMPLAINT:  CC: Sclerotic osseous metastases  Current Treatment:  Observation   HISTORY OF PRESENT ILLNESS:  [name] is a 80 y.o. male referred for the evaluation and treatment of bone lesions. This began when the patient presented to the [facility] emergency department on September 27th due to left sided flank pain, altered mental status and lethargy. He also had been experiencing pain in his testicles for several weeks. He was also found to have an elevated creatinine of 3.45.  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 A 2.3 cm endophytic polypoid lesion was also observed in the left lower kidney, as well as small para-aortic lymph nodes measuring up to 10 mm,  new. Diagnostic ureteroscopy was performed and no obvious tumor or other abnormalities were seen. There was evidence of chronic inflammation and possibly a small abscess pocket. Cytology was negative. Biopsy of the left renal pelvis from September 29th was consistent with  predominant blood clot and inflammatory cells. A stent was placed and the patient felt significantly better. His kidney function also improved back to baseline of 2.0, and the patient was discharged. Digital rectal exam was negative. Of note, the patient has a history of transurethral microwave of the prostate for BPH. Despite his records, according to the patient, he has never been diagnosed with prostate cancer. His PSA was 0.19 in the hospital.  He was undergoing routine lung cancer screening CT annually, but has not had one this year. Most recent from July 2021 revealed benign findings.  PET imaging from October 31st revealed multifocal bony metastases mainly sclerotic with mild to moderate increased metabolic activity. Left retroperitoneal soft tissue is similar to the recent CT displaying a nodular appearance and without substantial uptake. Metastatic process is considered in addition to post inflammatory changes in evolution or retroperitoneal fibrosis. Periadrenal stranding on the left. He underwent bone biopsy on November 17th but the official pathology report is still pending. However, I have spoken with the pathologist who does state that this is malignant and suggestive of adenocarcinoma. The CK7 stain was positive. The stains for prostate cancer were negative. The stains for Napsin A and TTF-1 were negative. The stains for PAX8 were equivocal.  We therefore are left with an unknown primary.  INTERVAL HISTORY:  I have reviewed his chart and materials related to his cancer extensively and collaborated history with the patient and his daughters. Summary of oncologic history is as follows:  Oncology History   No history exists.   [name] is here for routine follow up and states that he has good days and bad days. He denies pain other than when he gets up or sits back down. He uses Tylenol as needed, but has not used this often. He still has intermittent testicular pain. He remains fatigued, and comes  into the clinic in a wheelchair. He reports a "knot" around the belly button. He is now on midodrine for hypotension. 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 MSI-High was not detected. Hemoglobin has decreased from 12.9 to 11.9, and white count and platelets are normal. Chemistries are remarkable for a BUN of 55, a creatinine of 1.9, previously 2.2, a bilirubin of 1.4, an alkaline phosphatase of 500, and mildly elevated liver transaminases. His  appetite is good, and he has lost 1 pound since his last visit.  He denies fever, chills or other signs of infection.  He denies nausea, vomiting, bowel issues, or abdominal pain.  He denies sore throat, cough, dyspnea, or chest pain.  HISTORY:   Allergies:  Allergies  Allergen Reactions  . Iodinated Diagnostic Agents Other (See Comments)    Decreased kidney function  . Omnipaque [Iohexol] Other (See Comments)    Decreased kidney function  . Primaxin [Imipenem] Hives  . Cephalosporins Rash    Blisters   . Lisinopril Nausea And Vomiting    Drops blood pressure   . Sulfa Antibiotics   . Clindamycin/Lincomycin Swelling and Rash    Current Medications: Current Outpatient Medications  Medication Sig Dispense Refill  . acetaminophen (TYLENOL) 500 MG tablet Take 1 tablet (500 mg total) by mouth every 6 (six) hours as needed. 30 tablet  0  . albuterol (VENTOLIN HFA) 108 (90 Base) MCG/ACT inhaler TAKE 2 PUFFS BY MOUTH EVERY 6 HOURS AS NEEDED FOR WHEEZE OR SHORTNESS OF BREATH 8.5 each 2  . allopurinol (ZYLOPRIM) 300 MG tablet Take 300 mg by mouth daily.    Marland Kitchen aspirin EC 81 MG tablet Take 81 mg by mouth daily.    Marland Kitchen atorvastatin (LIPITOR) 80 MG tablet TAKE 1 TABLET (80 MG TOTAL) BY MOUTH DAILY BEFORE BREAKFAST. 90 tablet 3  . Blood Glucose Calibration (ONETOUCH VERIO) SOLN Use to calibrate one touch verio level 3 1 each 3  . BREO ELLIPTA 100-25 MCG/ACT AEPB INHALE 1 PUFF  BY MOUTH EVERY DAY 60 each 5  . calcitRIOL (ROCALTROL) 0.25 MCG capsule Take 0.25 mcg by mouth daily.     Marland Kitchen dextromethorphan-guaiFENesin (MUCINEX DM) 30-600 MG 12hr tablet Take 1 tablet by mouth 2 (two) times daily.    . fexofenadine (ALLEGRA) 180 MG tablet Take 90 mg by mouth daily.     . Fluticasone-Umeclidin-Vilant (TRELEGY ELLIPTA) 100-62.5-25 MCG/INH AEPB Inhale 1 puff into the lungs daily. 1 each 6  . glucose blood (ONETOUCH VERIO) test strip Use to check blood sugar once a day. 100 strip 12  . insulin degludec (TRESIBA FLEXTOUCH) 200 UNIT/ML FlexTouch Pen Inject 28 Units into the skin daily. (Patient taking differently: Inject 16 Units into the skin daily.)    . Insulin Pen Needle 31G X 4 MM MISC 1 each by Does not apply route daily. Use to inject insulin daily 100 each 2  . midodrine (PROAMATINE) 2.5 MG tablet Take 1 tablet (2.5 mg total) by mouth 3 (three) times daily with meals. 90 tablet 4  . mirtazapine (REMERON) 7.5 MG tablet Take 1 tablet (7.5 mg total) by mouth at bedtime. 30 tablet 0  . Omega-3 Fatty Acids (FISH OIL MAXIMUM STRENGTH PO) Take 1 capsule by mouth in the morning and at bedtime.     . ondansetron (ZOFRAN) 4 MG tablet Take 1 tablet (4 mg total) by mouth every 8 (eight) hours as needed for nausea or vomiting. 20 tablet 5  . OZEMPIC, 1 MG/DOSE, 4 MG/3ML SOPN INJECT 1MG INTO THE SKIN ONCE A WEEK 9 mL 0  . pantoprazole (PROTONIX) 40 MG tablet Take 40 mg by mouth at bedtime.     . Potassium Chloride (KLOR-CON 10 PO) Take 10 mg by mouth daily.    Marland Kitchen torsemide (DEMADEX) 20 MG tablet Take 1 tablet (20 mg total) by mouth daily. (Patient taking differently: Take 20 mg by mouth daily. Morning only) 30 tablet 4   No current facility-administered medications for this visit.    REVIEW OF SYSTEMS:  Review of Systems  Constitutional:  Positive for fatigue. Negative for appetite change, chills, fever and unexpected weight change.  HENT:  Negative.    Eyes:  Positive for eye problems  (vision changes, scheduled for cataract surgery in [place]).  Respiratory:  Negative for chest tightness, cough, hemoptysis, shortness of breath and wheezing.   Cardiovascular: Negative.  Negative for chest pain, leg swelling and palpitations.  Gastrointestinal:  Negative for abdominal distention, abdominal pain, blood in stool, constipation, diarrhea, nausea and vomiting.       Fecal incontinence  Genitourinary:  Negative for difficulty urinating, dysuria, frequency and hematuria.        Testicular pain  Musculoskeletal:  Positive for arthralgias and gait problem (in a wheelchair). Negative for back pain, flank pain and myalgias.  Skin: Negative.   Neurological:  Positive for gait problem (in  a wheelchair). Negative for dizziness, extremity weakness, headaches, light-headedness, numbness, seizures and speech difficulty.  Hematological: Negative.   Psychiatric/Behavioral:  Positive for confusion (occasional short term memory loss). Negative for depression and sleep disturbance. The patient is not nervous/anxious.   All other systems reviewed and are negative.   VITALS:  Blood pressure 104/62, pulse (!) 106, temperature 98.3 F (36.8 C), temperature source Oral, resp. rate 18, height 5' 5"  (1.651 m), weight 176 lb 12.8 oz (80.2 kg), SpO2 95 %.  Wt Readings from Last 3 Encounters:  06/09/21 176 lb 12.8 oz (80.2 kg)  05/30/21 177 lb 3.2 oz (80.4 kg)  05/25/21 177 lb 8 oz (80.5 kg)    Body mass index is 29.42 kg/m.  Performance status (ECOG): 2 - Symptomatic, <50% confined to bed  PHYSICAL EXAM:  Physical Exam Constitutional:      General: He is not in acute distress.    Appearance: Normal appearance. He is normal weight.  HENT:     Head: Normocephalic and atraumatic.  Eyes:     General: No scleral icterus.    Extraocular Movements: Extraocular movements intact.     Conjunctiva/sclera: Conjunctivae normal.     Pupils: Pupils are equal, round, and reactive to light.  Cardiovascular:      Rate and Rhythm: Regular rhythm. Tachycardia present.     Pulses: Normal pulses.     Heart sounds: Normal heart sounds. No murmur heard.   No friction rub. No gallop.  Pulmonary:     Effort: Pulmonary effort is normal. No respiratory distress.     Breath sounds: Wheezing (occcasional) present.  Abdominal:     General: Bowel sounds are normal. There is no distension.     Palpations: Abdomen is soft. There is no hepatomegaly, splenomegaly or mass.     Tenderness: There is no abdominal tenderness.     Comments: 1-2 cm nodule just above the umbilicus.  Musculoskeletal:        General: Normal range of motion.     Cervical back: Normal range of motion and neck supple.     Right lower leg: No edema.     Left lower leg: No edema.  Lymphadenopathy:     Cervical: No cervical adenopathy.  Skin:    General: Skin is warm and dry.  Neurological:     General: No focal deficit present.     Mental Status: He is alert and oriented to person, place, and time. Mental status is at baseline.  Psychiatric:        Mood and Affect: Mood normal.        Behavior: Behavior normal.        Thought Content: Thought content normal.        Judgment: Judgment normal.    LABS:   CBC Latest Ref Rng & Units 06/09/2021 05/25/2021 05/24/2021  WBC - 7.1 4.5 5.9  Hemoglobin 13.5 - 17.5 11.9(A) 12.9(A) 12.6(A)  Hematocrit 41 - 53 36(A) 41 40(A)  Platelets 150 - 399 238 174 199   CMP Latest Ref Rng & Units 06/09/2021 05/25/2021 05/19/2021  Glucose 70 - 99 mg/dL - - -  BUN 4 - 21 55(A) 55(A) 53(A)  Creatinine 0.6 - 1.3 1.9(A) 2.2(A) 2.4(A)  Sodium 137 - 147 138 135(A) 138  Potassium 3.4 - 5.3 4.6 4.6 4.5  Chloride 99 - 108 105 101 105  CO2 13 - 22 26(A) 25(A) 28(A)  Calcium 8.7 - 10.7 9.5 9.3 9.2  Total Protein 6.5 - 8.1 g/dL - - -  Total Bilirubin 0.3 - 1.2 mg/dL - - -  Alkaline Phos 25 - 125 500(A) 214(A) -  AST 14 - 40 76(A) 48(A) -  ALT 10 - 40 68(A) 37 -     No results found for: CEA1 / No  results found for: CEA1 No results found for: PSA1 No results found for: QJS473  Lab Results  Component Value Date   TOTALPROTELP 6.8 04/21/2021   ALBUMINELP 3.0 04/21/2021   A1GS 0.4 04/21/2021   A2GS 1.3 (H) 04/21/2021   BETS 0.9 04/21/2021   BETA2SER 5.2 01/03/2013   GAMS 1.2 04/21/2021   MSPIKE Not Observed 04/21/2021   SPEI Comment 04/21/2021   Lab Results  Component Value Date   TIBC 176 (L) 07/23/2013   TIBC 226 01/04/2013   TIBC 233 01/03/2013   FERRITIN 1,109 (H) 03/26/2019   FERRITIN 1,151 (H) 03/25/2019   FERRITIN 973 (H) 03/24/2019   IRONPCTSAT 7 (L) 07/23/2013   IRONPCTSAT 27 01/04/2013   IRONPCTSAT 24 01/03/2013   Lab Results  Component Value Date   LDH 201 07/31/2012    STUDIES:  No results found.    I, Rita Ohara, am acting as scribe for Vincent Kaplan, MD  I have reviewed this report as typed by the medical scribe, and it is complete and accurate.

## 2021-06-07 ENCOUNTER — Ambulatory Visit (INDEPENDENT_AMBULATORY_CARE_PROVIDER_SITE_OTHER): Payer: Medicare Other

## 2021-06-07 DIAGNOSIS — Z950 Presence of cardiac pacemaker: Secondary | ICD-10-CM | POA: Diagnosis not present

## 2021-06-07 DIAGNOSIS — I5032 Chronic diastolic (congestive) heart failure: Secondary | ICD-10-CM | POA: Diagnosis not present

## 2021-06-07 NOTE — Progress Notes (Signed)
EPIC Encounter for ICM Monitoring  Patient Name: [name] is a 80 y.o. male Date: 06/07/2021 Primary Care Physican: [name], MD Primary Cardiologist: [name] Electrophysiologist: Vergie Living Pacing: 98%  05/30/2021 Office Weight: 177 lbs           Allena Katz, NP at [facility] referred patient for monthly ICM follow up.  Torsemide decreased to 20 mg daily per 11/28 [facility] OV note.  1st ICM remote transmission and ICM intro call attempted.  Attempted call to patient and unable to reach.  Left message to return call. Transmission reviewed.    CorVue thoracic impedance suggesting possible fluid accumulation starting 06/02/2021 (Torsemide decreased 11/28) but slightly starting to trend back toward baseline.   Prescribed:  Torsemide 20 mg take 1 tablet (20 mg total) by mouth daily. Potassium 10 mEq take 1 tablet (10 mEq total) by mouth daily.  Recommendations:  Unable to reach.    Follow-up plan: ICM clinic phone appointment on 06/15/2021 to recheck fluid levels.   91 day device clinic remote transmission 08/05/2021.    EP/Cardiology Office Visits: 07/12/2021 with Dr. [name].    Copy of ICM check sent to Dr. Caryl Comes.   3 month ICM trend: 06/07/2021.    12-14 Month ICM trend:       Rosalene Billings, RN 06/07/2021 8:38 AM

## 2021-06-07 NOTE — Progress Notes (Signed)
Received: Today Milford, Jessica M, FNP  Makai Dumond S, RN Thanks Tanda Morrissey :)  

## 2021-06-08 ENCOUNTER — Other Ambulatory Visit: Payer: Self-pay | Admitting: Pulmonary Disease

## 2021-06-09 ENCOUNTER — Inpatient Hospital Stay: Payer: Medicare Other

## 2021-06-09 ENCOUNTER — Other Ambulatory Visit: Payer: Self-pay | Admitting: Hematology and Oncology

## 2021-06-09 ENCOUNTER — Encounter: Payer: Self-pay | Admitting: Oncology

## 2021-06-09 ENCOUNTER — Inpatient Hospital Stay: Payer: Medicare Other | Attending: Oncology | Admitting: Oncology

## 2021-06-09 VITALS — BP 104/62 | HR 106 | Temp 98.3°F | Resp 18 | Ht 65.0 in | Wt 176.8 lb

## 2021-06-09 DIAGNOSIS — C801 Malignant (primary) neoplasm, unspecified: Secondary | ICD-10-CM | POA: Diagnosis not present

## 2021-06-09 DIAGNOSIS — C7951 Secondary malignant neoplasm of bone: Secondary | ICD-10-CM

## 2021-06-09 LAB — BASIC METABOLIC PANEL
BUN: 55 — AB (ref 4–21)
CO2: 26 — AB (ref 13–22)
Chloride: 105 (ref 99–108)
Creatinine: 1.9 — AB (ref 0.6–1.3)
Glucose: 115
Potassium: 4.6 (ref 3.4–5.3)
Sodium: 138 (ref 137–147)

## 2021-06-09 LAB — COMPREHENSIVE METABOLIC PANEL
Albumin: 3.4 — AB (ref 3.5–5.0)
Calcium: 9.5 (ref 8.7–10.7)

## 2021-06-09 LAB — HEPATIC FUNCTION PANEL
ALT: 68 — AB (ref 10–40)
AST: 76 — AB (ref 14–40)
Alkaline Phosphatase: 500 — AB (ref 25–125)
Bilirubin, Total: 1.4

## 2021-06-09 LAB — CBC AND DIFFERENTIAL
HCT: 36 — AB (ref 41–53)
Hemoglobin: 11.9 — AB (ref 13.5–17.5)
Neutrophils Absolute: 6.04
Platelets: 238 (ref 150–399)
WBC: 7.1

## 2021-06-09 LAB — CBC: RBC: 4.21 (ref 3.87–5.11)

## 2021-06-09 MED ORDER — MIRTAZAPINE 7.5 MG PO TABS: 7.5000 mg | ORAL_TABLET | Freq: Every day | ORAL | 0 refills | Status: AC

## 2021-06-15 ENCOUNTER — Ambulatory Visit (INDEPENDENT_AMBULATORY_CARE_PROVIDER_SITE_OTHER): Payer: Medicare Other

## 2021-06-15 ENCOUNTER — Telehealth: Payer: Self-pay

## 2021-06-15 DIAGNOSIS — I5032 Chronic diastolic (congestive) heart failure: Secondary | ICD-10-CM

## 2021-06-15 DIAGNOSIS — Z950 Presence of cardiac pacemaker: Secondary | ICD-10-CM

## 2021-06-15 NOTE — Progress Notes (Signed)
Received: Today [name], Vincent Bo, FNP  Roslynn Holte Panda, RN Thanks Margarita Grizzle. Let's Increase torsemide back to 20 mg bid. Will need bmet in 10-14 days.

## 2021-06-15 NOTE — Progress Notes (Signed)
Attempted call to patient to provide recommendations from [facility], NP and unable to reach.  Left message to return call.

## 2021-06-15 NOTE — Telephone Encounter (Signed)
Remote ICM transmission received.  Attempted call to patient regarding ICM remote transmission and ICM intro.  Left detailed message per DPR to return call.  Provided ICM direct number for call back.

## 2021-06-15 NOTE — Progress Notes (Signed)
EPIC Encounter for ICM Monitoring  Patient Name: [name] is a 80 y.o. male Date: 06/15/2021 Primary Care Physican: [name], MD Primary Cardiologist: [name] Electrophysiologist: Vergie Living Pacing: 98%            05/30/2021 Office Weight: 177 lbs                                   Attempted ICM intro call to patient and unable to reach.  Left detailed message per DPR regarding transmission. Transmission reviewed.    CorVue thoracic impedance suggesting possible fluid accumulation starting 06/02/2021 (Torsemide decreased 11/28).    Prescribed:  Torsemide 20 mg take 1 tablet (20 mg total) by mouth daily (decreased to 20 mg daily per 11/28 ) Potassium 10 mEq take 1 tablet (10 mEq total) by mouth daily.   Recommendations:  Copy sent to Allena Katz, NP for review and recommendations if needed.      Follow-up plan: ICM clinic phone appointment on 06/20/2021 (manual) to recheck fluid levels.   91 day device clinic remote transmission 08/05/2021.     EP/Cardiology Office Visits: 07/12/2021 with Dr. [name].     Copy of ICM check sent to Dr. Caryl Comes.   3 month ICM trend: 06/15/2021.    12-14 Month ICM trend:       Rosalene Billings, RN 06/15/2021 7:59 AM

## 2021-06-17 ENCOUNTER — Other Ambulatory Visit: Payer: Self-pay

## 2021-06-17 ENCOUNTER — Telehealth: Payer: Self-pay

## 2021-06-17 NOTE — Progress Notes (Signed)
Message sent to Allena Katz, NP at [facility] unable to reach patient to provide recommendations.

## 2021-06-17 NOTE — Telephone Encounter (Signed)
Medications taken off med list

## 2021-06-17 NOTE — Progress Notes (Signed)
Received: Today Milford, Maricela Bo, FNP  Blessed Cotham Panda, RN Ok thank you Margarita Grizzle

## 2021-06-17 NOTE — Telephone Encounter (Signed)
-----   Message from [name], MD sent at 06/16/2021  6:59 PM EST ----- Regarding: med We stopped atorvastatin, allopurinol and Tresiba

## 2021-06-17 NOTE — Progress Notes (Signed)
Attempted call to patient to provide recommendations from [facility], NP and unable to reach.  Left message to return call.

## 2021-06-21 ENCOUNTER — Telehealth: Payer: Self-pay | Admitting: Oncology

## 2021-06-21 NOTE — Telephone Encounter (Signed)
Per [name], patient transitioning into [place]

## 2021-06-22 NOTE — Progress Notes (Signed)
No ICM remote transmission received for 06/20/2021 and next ICM transmission scheduled for 07/11/2021.

## 2021-06-23 ENCOUNTER — Encounter: Payer: Self-pay | Admitting: Oncology

## 2021-07-02 ENCOUNTER — Other Ambulatory Visit: Payer: Self-pay | Admitting: Hematology and Oncology

## 2021-07-11 ENCOUNTER — Ambulatory Visit (INDEPENDENT_AMBULATORY_CARE_PROVIDER_SITE_OTHER)

## 2021-07-11 ENCOUNTER — Telehealth: Payer: Self-pay

## 2021-07-11 DIAGNOSIS — I5032 Chronic diastolic (congestive) heart failure: Secondary | ICD-10-CM

## 2021-07-11 DIAGNOSIS — Z950 Presence of cardiac pacemaker: Secondary | ICD-10-CM

## 2021-07-11 NOTE — Telephone Encounter (Signed)
Remote ICM transmission received.  Attempted call to patient regarding ICM remote transmission and ICM intro and no answer.  Call goes straight to voice mail.  Unable to engage patient in monthly ICM follow up.  Per protocol, device clinic will continue 91 day remote monitoring but no further ICM monthly follow ups at this time.

## 2021-07-11 NOTE — Progress Notes (Signed)
EPIC Encounter for ICM Monitoring  Patient Name: [name] is a 81 y.o. male Date: 07/11/2021 Primary Care Physican: [name], MD Primary Cardiologist: [name] Electrophysiologist: Vergie Living Pacing: 98%            05/30/2021 Office Weight: 177 lbs                                   Attempted ICM intro call to patient and unable to reach.  Left detailed message per DPR regarding transmission. Transmission reviewed.   Disenrolled from [facility] monthly follow up due to unable to reach patient for ICM participation.     CorVue thoracic impedance suggesting possible fluid accumulation from 06/02/2021 - 06/28/2021.    Prescribed:  Torsemide 20 mg take 1 tablet (20 mg total) by mouth daily (decreased to 20 mg daily per 11/28 ) Potassium 10 mEq take 1 tablet (10 mEq total) by mouth daily.   Recommendations:  Copy sent to Allena Katz, NP for review and recommendations if needed.      Follow-up plan: No further ICM clinic phone appointments.   91 day device clinic remote transmission 08/05/2021.     EP/Cardiology Office Visits: 07/12/2021 with Dr. [name].     Copy of ICM check sent to Dr. Caryl Comes.   3 month ICM trend: 07/11/2021.    12-14 Month ICM trend:     Vincent Billings, RN 07/11/2021 12:29 PM

## 2021-07-12 ENCOUNTER — Encounter (HOSPITAL_COMMUNITY): Payer: Medicare Other | Admitting: Cardiology

## 2021-07-15 ENCOUNTER — Other Ambulatory Visit: Payer: Medicare Other

## 2021-07-15 ENCOUNTER — Ambulatory Visit: Payer: Medicare Other | Admitting: Hematology and Oncology

## 2021-07-15 ENCOUNTER — Encounter: Payer: Self-pay | Admitting: Internal Medicine

## 2021-07-19 ENCOUNTER — Other Ambulatory Visit: Payer: Self-pay | Admitting: Cardiology

## 2021-08-03 DEATH — deceased

## 2021-09-23 ENCOUNTER — Ambulatory Visit: Payer: Medicare Other | Admitting: Internal Medicine

## 2022-10-26 NOTE — Telephone Encounter (Signed)
Done
